# Patient Record
Sex: Female | Born: 1945 | Race: Black or African American | Hispanic: No | Smoking: Former smoker
Health system: Southern US, Community
[De-identification: ages and names within clinical notes are randomized; demographics above are authoritative.]

## PROBLEM LIST (undated history)

## (undated) DIAGNOSIS — I493 Ventricular premature depolarization: Secondary | ICD-10-CM

## (undated) DIAGNOSIS — F419 Anxiety disorder, unspecified: Secondary | ICD-10-CM

## (undated) DIAGNOSIS — J849 Interstitial pulmonary disease, unspecified: Secondary | ICD-10-CM

## (undated) DIAGNOSIS — I10 Essential (primary) hypertension: Secondary | ICD-10-CM

## (undated) DIAGNOSIS — E041 Nontoxic single thyroid nodule: Secondary | ICD-10-CM

## (undated) DIAGNOSIS — E119 Type 2 diabetes mellitus without complications: Secondary | ICD-10-CM

## (undated) DIAGNOSIS — I472 Ventricular tachycardia: Secondary | ICD-10-CM

## (undated) DIAGNOSIS — K649 Unspecified hemorrhoids: Secondary | ICD-10-CM

## (undated) DIAGNOSIS — I272 Pulmonary hypertension, unspecified: Secondary | ICD-10-CM

## (undated) DIAGNOSIS — H269 Unspecified cataract: Secondary | ICD-10-CM

## (undated) DIAGNOSIS — D259 Leiomyoma of uterus, unspecified: Secondary | ICD-10-CM

## (undated) DIAGNOSIS — L509 Urticaria, unspecified: Secondary | ICD-10-CM

## (undated) DIAGNOSIS — T7840XA Allergy, unspecified, initial encounter: Secondary | ICD-10-CM

## (undated) DIAGNOSIS — M1990 Unspecified osteoarthritis, unspecified site: Secondary | ICD-10-CM

## (undated) DIAGNOSIS — K219 Gastro-esophageal reflux disease without esophagitis: Secondary | ICD-10-CM

## (undated) DIAGNOSIS — E785 Hyperlipidemia, unspecified: Secondary | ICD-10-CM

## (undated) DIAGNOSIS — I4729 Other ventricular tachycardia: Secondary | ICD-10-CM

## (undated) HISTORY — DX: Gastro-esophageal reflux disease without esophagitis: K21.9

## (undated) HISTORY — DX: Pulmonary hypertension, unspecified: I27.20

## (undated) HISTORY — DX: Ventricular premature depolarization: I49.3

## (undated) HISTORY — DX: Leiomyoma of uterus, unspecified: D25.9

## (undated) HISTORY — DX: Hyperlipidemia, unspecified: E78.5

## (undated) HISTORY — DX: Unspecified cataract: H26.9

## (undated) HISTORY — DX: Other ventricular tachycardia: I47.29

## (undated) HISTORY — DX: Interstitial pulmonary disease, unspecified: J84.9

## (undated) HISTORY — DX: Other disorders of phosphorus metabolism: E83.39

## (undated) HISTORY — DX: Essential (primary) hypertension: I10

## (undated) HISTORY — PX: TOENAIL EXCISION: SUR558

## (undated) HISTORY — DX: Unspecified osteoarthritis, unspecified site: M19.90

## (undated) HISTORY — DX: Type 2 diabetes mellitus without complications: E11.9

## (undated) HISTORY — DX: Urticaria, unspecified: L50.9

## (undated) HISTORY — DX: Anxiety disorder, unspecified: F41.9

## (undated) HISTORY — DX: Allergy, unspecified, initial encounter: T78.40XA

## (undated) HISTORY — DX: Ventricular tachycardia: I47.2

## (undated) HISTORY — DX: Nontoxic single thyroid nodule: E04.1

## (undated) HISTORY — PX: POLYPECTOMY: SHX149

## (undated) HISTORY — PX: TYMPANOSTOMY TUBE PLACEMENT: SHX32

## (undated) HISTORY — DX: Unspecified hemorrhoids: K64.9

## (undated) HISTORY — PX: TUBAL LIGATION: SHX77

---

## 1999-04-30 HISTORY — PX: OTHER SURGICAL HISTORY: SHX169

## 1999-11-28 ENCOUNTER — Ambulatory Visit (HOSPITAL_COMMUNITY): Admission: RE | Admit: 1999-11-28 | Discharge: 1999-11-28 | Payer: Self-pay | Admitting: Endocrinology

## 1999-11-28 ENCOUNTER — Encounter: Payer: Self-pay | Admitting: Endocrinology

## 2001-05-21 ENCOUNTER — Emergency Department (HOSPITAL_COMMUNITY): Admission: EM | Admit: 2001-05-21 | Discharge: 2001-05-22 | Payer: Self-pay | Admitting: Emergency Medicine

## 2001-05-21 ENCOUNTER — Encounter: Payer: Self-pay | Admitting: Emergency Medicine

## 2002-01-04 ENCOUNTER — Encounter: Admission: RE | Admit: 2002-01-04 | Discharge: 2002-01-04 | Payer: Self-pay | Admitting: Endocrinology

## 2002-01-04 ENCOUNTER — Encounter: Payer: Self-pay | Admitting: Endocrinology

## 2002-01-16 HISTORY — PX: OTHER SURGICAL HISTORY: SHX169

## 2002-01-19 ENCOUNTER — Other Ambulatory Visit: Admission: RE | Admit: 2002-01-19 | Discharge: 2002-01-19 | Payer: Self-pay | Admitting: Endocrinology

## 2003-01-22 ENCOUNTER — Other Ambulatory Visit: Admission: RE | Admit: 2003-01-22 | Discharge: 2003-01-22 | Payer: Self-pay | Admitting: Endocrinology

## 2004-01-03 ENCOUNTER — Emergency Department (HOSPITAL_COMMUNITY): Admission: EM | Admit: 2004-01-03 | Discharge: 2004-01-03 | Payer: Self-pay | Admitting: Family Medicine

## 2004-01-23 ENCOUNTER — Other Ambulatory Visit: Admission: RE | Admit: 2004-01-23 | Discharge: 2004-01-23 | Payer: Self-pay | Admitting: Endocrinology

## 2004-05-27 ENCOUNTER — Ambulatory Visit: Payer: Self-pay | Admitting: Endocrinology

## 2004-10-30 ENCOUNTER — Ambulatory Visit: Payer: Self-pay | Admitting: Endocrinology

## 2004-12-02 ENCOUNTER — Ambulatory Visit: Payer: Self-pay | Admitting: Gastroenterology

## 2005-01-21 ENCOUNTER — Ambulatory Visit: Payer: Self-pay | Admitting: Endocrinology

## 2005-01-27 ENCOUNTER — Ambulatory Visit: Payer: Self-pay | Admitting: Endocrinology

## 2005-06-25 ENCOUNTER — Ambulatory Visit: Payer: Self-pay | Admitting: Internal Medicine

## 2005-06-29 HISTORY — PX: COLONOSCOPY: SHX174

## 2005-07-01 ENCOUNTER — Ambulatory Visit: Payer: Self-pay | Admitting: Gastroenterology

## 2005-07-16 ENCOUNTER — Ambulatory Visit: Payer: Self-pay | Admitting: Gastroenterology

## 2005-08-18 ENCOUNTER — Ambulatory Visit: Payer: Self-pay | Admitting: Internal Medicine

## 2005-08-26 ENCOUNTER — Ambulatory Visit (HOSPITAL_COMMUNITY): Admission: RE | Admit: 2005-08-26 | Discharge: 2005-08-26 | Payer: Self-pay | Admitting: Internal Medicine

## 2005-10-15 ENCOUNTER — Ambulatory Visit: Payer: Self-pay | Admitting: Endocrinology

## 2005-10-31 ENCOUNTER — Ambulatory Visit: Payer: Self-pay | Admitting: Family Medicine

## 2006-01-22 ENCOUNTER — Ambulatory Visit: Payer: Self-pay | Admitting: Endocrinology

## 2006-02-02 ENCOUNTER — Ambulatory Visit: Payer: Self-pay | Admitting: Endocrinology

## 2006-02-12 ENCOUNTER — Ambulatory Visit: Payer: Self-pay

## 2006-02-12 ENCOUNTER — Encounter: Payer: Self-pay | Admitting: Endocrinology

## 2006-02-12 HISTORY — PX: OTHER SURGICAL HISTORY: SHX169

## 2006-03-24 ENCOUNTER — Ambulatory Visit: Payer: Self-pay | Admitting: Endocrinology

## 2006-05-03 ENCOUNTER — Ambulatory Visit: Payer: Self-pay | Admitting: Endocrinology

## 2006-05-03 LAB — CONVERTED CEMR LAB
Chol/HDL Ratio, serum: 3.3
Cholesterol: 114 mg/dL (ref 0–200)
HDL: 34.2 mg/dL — ABNORMAL LOW (ref >39.0)
Hgb A1c MFr Bld: 5.4 % (ref 4.6–6.0)
LDL Cholesterol: 70 mg/dL (ref 0–99)
Triglyceride fasting, serum: 48 mg/dL (ref 0–149)
VLDL: 10 mg/dL (ref 0–40)

## 2006-05-05 ENCOUNTER — Ambulatory Visit: Payer: Self-pay | Admitting: Endocrinology

## 2006-05-26 ENCOUNTER — Ambulatory Visit: Payer: Self-pay | Admitting: Endocrinology

## 2006-07-10 ENCOUNTER — Ambulatory Visit: Payer: Self-pay | Admitting: Family Medicine

## 2006-07-30 ENCOUNTER — Ambulatory Visit: Payer: Self-pay | Admitting: Internal Medicine

## 2006-09-25 ENCOUNTER — Ambulatory Visit: Payer: Self-pay | Admitting: Family Medicine

## 2006-11-27 ENCOUNTER — Ambulatory Visit: Payer: Self-pay | Admitting: Family Medicine

## 2006-12-10 ENCOUNTER — Ambulatory Visit: Payer: Self-pay | Admitting: Endocrinology

## 2006-12-16 ENCOUNTER — Ambulatory Visit: Payer: Self-pay | Admitting: Endocrinology

## 2007-01-14 ENCOUNTER — Encounter: Payer: Self-pay | Admitting: Endocrinology

## 2007-01-14 DIAGNOSIS — K219 Gastro-esophageal reflux disease without esophagitis: Secondary | ICD-10-CM

## 2007-01-14 DIAGNOSIS — J302 Other seasonal allergic rhinitis: Secondary | ICD-10-CM | POA: Insufficient documentation

## 2007-01-14 DIAGNOSIS — J309 Allergic rhinitis, unspecified: Secondary | ICD-10-CM

## 2007-01-14 DIAGNOSIS — M810 Age-related osteoporosis without current pathological fracture: Secondary | ICD-10-CM

## 2007-01-25 ENCOUNTER — Ambulatory Visit: Payer: Self-pay | Admitting: Internal Medicine

## 2007-01-28 ENCOUNTER — Ambulatory Visit: Payer: Self-pay | Admitting: Endocrinology

## 2007-01-28 LAB — CONVERTED CEMR LAB
Albumin: 3.8 g/dL (ref 3.5–5.2)
Alkaline Phosphatase: 104 units/L (ref 39–117)
BUN: 11 mg/dL (ref 6–23)
Basophils Absolute: 0 10*3/uL (ref 0.0–0.1)
Basophils Relative: 0.6 % (ref 0.0–1.0)
Bilirubin Urine: NEGATIVE
Cholesterol: 161 mg/dL (ref 0–200)
Creatinine, Ser: 0.6 mg/dL (ref 0.4–1.2)
Creatinine,U: 93.2 mg/dL
Crystals: NEGATIVE
GFR calc Af Amer: 131 mL/min
GFR calc non Af Amer: 108 mL/min
HDL: 36.7 mg/dL — ABNORMAL LOW (ref >39.0)
Ketones, ur: NEGATIVE mg/dL
LDL Cholesterol: 105 mg/dL — ABNORMAL HIGH (ref 0–99)
Lymphocytes Relative: 41.1 % (ref 12.0–46.0)
MCHC: 33.2 g/dL (ref 30.0–36.0)
Microalb Creat Ratio: 6.4 mg/g (ref 0.0–30.0)
Microalb, Ur: 0.6 mg/dL (ref 0.0–1.9)
Monocytes Absolute: 0.5 10*3/uL (ref 0.2–0.7)
Monocytes Relative: 9.5 % (ref 3.0–11.0)
Neutro Abs: 2 10*3/uL (ref 1.4–7.7)
Nitrite: NEGATIVE
Platelets: 218 10*3/uL (ref 150–400)
Potassium: 4.3 meq/L (ref 3.5–5.1)
Sodium: 144 meq/L (ref 135–145)
Specific Gravity, Urine: 1.015 (ref 1.000–1.03)
TSH: 1.33 microintl units/mL (ref 0.35–5.50)
Total Bilirubin: 0.8 mg/dL (ref 0.3–1.2)
Triglycerides: 97 mg/dL (ref 0–149)
Urine Glucose: NEGATIVE mg/dL
Urobilinogen, UA: 0.2 (ref 0.0–1.0)
VLDL: 19 mg/dL (ref 0–40)
pH: 6 (ref 5.0–8.0)

## 2007-04-11 ENCOUNTER — Ambulatory Visit: Payer: Self-pay | Admitting: Endocrinology

## 2007-04-18 ENCOUNTER — Encounter: Payer: Self-pay | Admitting: Endocrinology

## 2007-08-01 ENCOUNTER — Encounter: Payer: Self-pay | Admitting: Endocrinology

## 2007-08-01 ENCOUNTER — Ambulatory Visit: Payer: Self-pay | Admitting: Endocrinology

## 2007-08-01 DIAGNOSIS — R05 Cough: Secondary | ICD-10-CM

## 2007-08-02 ENCOUNTER — Telehealth: Payer: Self-pay | Admitting: Endocrinology

## 2007-08-09 ENCOUNTER — Telehealth (INDEPENDENT_AMBULATORY_CARE_PROVIDER_SITE_OTHER): Payer: Self-pay | Admitting: *Deleted

## 2007-08-10 ENCOUNTER — Ambulatory Visit: Payer: Self-pay | Admitting: Internal Medicine

## 2007-08-10 DIAGNOSIS — I10 Essential (primary) hypertension: Secondary | ICD-10-CM | POA: Insufficient documentation

## 2007-08-10 DIAGNOSIS — M109 Gout, unspecified: Secondary | ICD-10-CM

## 2007-08-10 DIAGNOSIS — F411 Generalized anxiety disorder: Secondary | ICD-10-CM | POA: Insufficient documentation

## 2007-08-25 ENCOUNTER — Encounter: Payer: Self-pay | Admitting: Internal Medicine

## 2007-08-25 ENCOUNTER — Ambulatory Visit: Payer: Self-pay | Admitting: Endocrinology

## 2007-08-25 DIAGNOSIS — J209 Acute bronchitis, unspecified: Secondary | ICD-10-CM

## 2007-09-01 ENCOUNTER — Telehealth: Payer: Self-pay | Admitting: Internal Medicine

## 2007-09-01 ENCOUNTER — Encounter: Payer: Self-pay | Admitting: Internal Medicine

## 2007-10-12 ENCOUNTER — Telehealth: Payer: Self-pay | Admitting: Endocrinology

## 2007-11-17 ENCOUNTER — Ambulatory Visit: Payer: Self-pay | Admitting: Endocrinology

## 2007-11-17 LAB — CONVERTED CEMR LAB
ALT: 29 units/L (ref 0–35)
AST: 22 units/L (ref 0–37)
Alkaline Phosphatase: 89 units/L (ref 39–117)
BUN: 10 mg/dL (ref 6–23)
CO2: 28 meq/L (ref 19–32)
Chloride: 105 meq/L (ref 96–112)
Cholesterol: 203 mg/dL (ref 0–200)
GFR calc non Af Amer: 77 mL/min
Potassium: 4 meq/L (ref 3.5–5.1)
Total Bilirubin: 0.7 mg/dL (ref 0.3–1.2)
VLDL: 24 mg/dL (ref 0–40)

## 2007-11-24 ENCOUNTER — Ambulatory Visit: Payer: Self-pay | Admitting: Endocrinology

## 2007-11-24 DIAGNOSIS — R748 Abnormal levels of other serum enzymes: Secondary | ICD-10-CM | POA: Insufficient documentation

## 2007-11-24 DIAGNOSIS — E041 Nontoxic single thyroid nodule: Secondary | ICD-10-CM

## 2007-11-24 DIAGNOSIS — G5600 Carpal tunnel syndrome, unspecified upper limb: Secondary | ICD-10-CM

## 2007-11-24 DIAGNOSIS — D259 Leiomyoma of uterus, unspecified: Secondary | ICD-10-CM | POA: Insufficient documentation

## 2007-11-24 DIAGNOSIS — E7800 Pure hypercholesterolemia, unspecified: Secondary | ICD-10-CM

## 2007-12-19 ENCOUNTER — Ambulatory Visit: Payer: Self-pay | Admitting: Endocrinology

## 2008-03-03 ENCOUNTER — Telehealth: Payer: Self-pay | Admitting: Endocrinology

## 2008-03-06 ENCOUNTER — Telehealth: Payer: Self-pay | Admitting: Endocrinology

## 2008-03-13 ENCOUNTER — Encounter (INDEPENDENT_AMBULATORY_CARE_PROVIDER_SITE_OTHER): Payer: Self-pay | Admitting: *Deleted

## 2008-03-13 ENCOUNTER — Telehealth: Payer: Self-pay | Admitting: Endocrinology

## 2008-03-26 ENCOUNTER — Ambulatory Visit: Payer: Self-pay | Admitting: Endocrinology

## 2008-03-26 ENCOUNTER — Encounter: Payer: Self-pay | Admitting: Endocrinology

## 2008-03-28 LAB — CONVERTED CEMR LAB
AST: 38 units/L — ABNORMAL HIGH (ref 0–37)
Albumin: 3.9 g/dL (ref 3.5–5.2)
Alkaline Phosphatase: 110 units/L (ref 39–117)
BUN: 8 mg/dL (ref 6–23)
Basophils Absolute: 0 10*3/uL (ref 0.0–0.1)
Bilirubin Urine: NEGATIVE
Chloride: 109 meq/L (ref 96–112)
Creatinine, Ser: 0.7 mg/dL (ref 0.4–1.2)
Creatinine,U: 64.4 mg/dL
GFR calc Af Amer: 109 mL/min
GFR calc non Af Amer: 90 mL/min
Glucose, Bld: 111 mg/dL — ABNORMAL HIGH (ref 70–99)
HDL: 37 mg/dL — ABNORMAL LOW (ref >39.0)
Hemoglobin, Urine: NEGATIVE
Hgb A1c MFr Bld: 5.8 % (ref 4.6–6.0)
Ketones, ur: NEGATIVE mg/dL
LDL Cholesterol: 132 mg/dL — ABNORMAL HIGH (ref 0–99)
Leukocytes, UA: NEGATIVE
Lymphocytes Relative: 25.3 % (ref 12.0–46.0)
MCHC: 33.6 g/dL (ref 30.0–36.0)
Microalb Creat Ratio: 4.7 mg/g (ref 0.0–30.0)
Neutro Abs: 3.8 10*3/uL (ref 1.4–7.7)
Neutrophils Relative %: 63.3 % (ref 43.0–77.0)
Platelets: 247 10*3/uL (ref 150–400)
Potassium: 4 meq/L (ref 3.5–5.1)
RDW: 12.6 % (ref 11.5–14.6)
Sodium: 144 meq/L (ref 135–145)
Total CHOL/HDL Ratio: 5.4
Total Protein: 7.8 g/dL (ref 6.0–8.3)
Triglycerides: 145 mg/dL (ref 0–149)
Urobilinogen, UA: 0.2 (ref 0.0–1.0)

## 2008-04-13 ENCOUNTER — Ambulatory Visit: Payer: Self-pay | Admitting: Endocrinology

## 2008-04-13 DIAGNOSIS — M79609 Pain in unspecified limb: Secondary | ICD-10-CM

## 2008-04-13 DIAGNOSIS — K769 Liver disease, unspecified: Secondary | ICD-10-CM | POA: Insufficient documentation

## 2008-04-25 ENCOUNTER — Ambulatory Visit: Payer: Self-pay

## 2008-04-25 ENCOUNTER — Encounter: Payer: Self-pay | Admitting: Endocrinology

## 2008-05-31 ENCOUNTER — Telehealth (INDEPENDENT_AMBULATORY_CARE_PROVIDER_SITE_OTHER): Payer: Self-pay | Admitting: *Deleted

## 2008-07-19 ENCOUNTER — Ambulatory Visit: Payer: Self-pay | Admitting: Endocrinology

## 2008-07-19 LAB — CONVERTED CEMR LAB
HDL: 38.1 mg/dL — ABNORMAL LOW (ref >39.0)
Total CHOL/HDL Ratio: 4.5

## 2008-08-04 ENCOUNTER — Telehealth: Payer: Self-pay | Admitting: Endocrinology

## 2008-08-13 ENCOUNTER — Telehealth (INDEPENDENT_AMBULATORY_CARE_PROVIDER_SITE_OTHER): Payer: Self-pay | Admitting: *Deleted

## 2008-12-26 ENCOUNTER — Ambulatory Visit: Payer: Self-pay | Admitting: Internal Medicine

## 2008-12-26 DIAGNOSIS — N390 Urinary tract infection, site not specified: Secondary | ICD-10-CM | POA: Insufficient documentation

## 2008-12-26 DIAGNOSIS — M545 Low back pain: Secondary | ICD-10-CM

## 2008-12-26 LAB — CONVERTED CEMR LAB
Nitrite: NEGATIVE
Specific Gravity, Urine: 1.01 (ref 1.000–1.030)
Total Protein, Urine: NEGATIVE mg/dL
pH: 7 (ref 5.0–8.0)

## 2008-12-28 ENCOUNTER — Telehealth: Payer: Self-pay | Admitting: Family Medicine

## 2009-01-01 ENCOUNTER — Encounter: Payer: Self-pay | Admitting: Internal Medicine

## 2009-02-12 ENCOUNTER — Ambulatory Visit: Payer: Self-pay | Admitting: Internal Medicine

## 2009-02-12 ENCOUNTER — Encounter: Payer: Self-pay | Admitting: Endocrinology

## 2009-04-29 ENCOUNTER — Telehealth: Payer: Self-pay | Admitting: Endocrinology

## 2009-06-18 ENCOUNTER — Ambulatory Visit: Payer: Self-pay | Admitting: Endocrinology

## 2009-06-19 LAB — CONVERTED CEMR LAB
ALT: 24 units/L (ref 0–35)
AST: 25 units/L (ref 0–37)
Albumin: 3.9 g/dL (ref 3.5–5.2)
Alkaline Phosphatase: 99 units/L (ref 39–117)
Basophils Absolute: 0 10*3/uL (ref 0.0–0.1)
Calcium: 9.4 mg/dL (ref 8.4–10.5)
Cholesterol: 222 mg/dL — ABNORMAL HIGH (ref 0–200)
Creatinine,U: 54.6 mg/dL
Direct LDL: 165.6 mg/dL
Eosinophils Relative: 6 % — ABNORMAL HIGH (ref 0.0–5.0)
GFR calc non Af Amer: 92.9 mL/min (ref >60)
HCT: 40.1 % (ref 36.0–46.0)
Hemoglobin: 13.2 g/dL (ref 12.0–15.0)
Lymphocytes Relative: 43.9 % (ref 12.0–46.0)
Lymphs Abs: 2.2 10*3/uL (ref 0.7–4.0)
Microalb Creat Ratio: 5.5 mg/g (ref 0.0–30.0)
Monocytes Relative: 13.2 % — ABNORMAL HIGH (ref 3.0–12.0)
Neutro Abs: 1.8 10*3/uL (ref 1.4–7.7)
Potassium: 3.9 meq/L (ref 3.5–5.1)
RBC: 4.56 M/uL (ref 3.87–5.11)
RDW: 13.2 % (ref 11.5–14.6)
Sodium: 142 meq/L (ref 135–145)
Specific Gravity, Urine: 1.01 (ref 1.000–1.030)
Total CHOL/HDL Ratio: 5
Urine Glucose: NEGATIVE mg/dL
Urobilinogen, UA: 0.2 (ref 0.0–1.0)
VLDL: 15.6 mg/dL (ref 0.0–40.0)
WBC: 4.9 10*3/uL (ref 4.5–10.5)
pH: 6 (ref 5.0–8.0)

## 2009-06-25 ENCOUNTER — Telehealth: Payer: Self-pay | Admitting: Endocrinology

## 2009-07-02 ENCOUNTER — Telehealth (INDEPENDENT_AMBULATORY_CARE_PROVIDER_SITE_OTHER): Payer: Self-pay | Admitting: *Deleted

## 2009-07-25 ENCOUNTER — Ambulatory Visit: Payer: Self-pay | Admitting: Endocrinology

## 2009-08-15 ENCOUNTER — Telehealth (INDEPENDENT_AMBULATORY_CARE_PROVIDER_SITE_OTHER): Payer: Self-pay | Admitting: *Deleted

## 2009-08-27 ENCOUNTER — Ambulatory Visit: Payer: Self-pay | Admitting: Endocrinology

## 2009-09-10 ENCOUNTER — Encounter: Payer: Self-pay | Admitting: Endocrinology

## 2009-09-10 LAB — CONVERTED CEMR LAB
Basophils Relative: 0 %
Creatinine, Ser: 0.66 mg/dL
Glucose, Bld: 102 mg/dL
MCV: 86.7 fL
Neutrophils Relative %: 44.7 %
Platelets: 202 10*3/uL
Potassium: 3.3 meq/L
RBC: 4.42 M/uL
Sodium: 137 meq/L
WBC: 5.1 10*3/uL

## 2009-10-21 ENCOUNTER — Ambulatory Visit: Payer: Self-pay | Admitting: Endocrinology

## 2009-12-20 ENCOUNTER — Ambulatory Visit: Payer: Self-pay | Admitting: Endocrinology

## 2009-12-20 DIAGNOSIS — R519 Headache, unspecified: Secondary | ICD-10-CM | POA: Insufficient documentation

## 2009-12-20 DIAGNOSIS — R5383 Other fatigue: Secondary | ICD-10-CM

## 2009-12-20 DIAGNOSIS — R5381 Other malaise: Secondary | ICD-10-CM

## 2009-12-20 DIAGNOSIS — R51 Headache: Secondary | ICD-10-CM

## 2009-12-20 LAB — CONVERTED CEMR LAB
Albumin: 4 g/dL (ref 3.5–5.2)
Basophils Absolute: 0 10*3/uL (ref 0.0–0.1)
Basophils Relative: 0.7 % (ref 0.0–3.0)
Calcium: 9.3 mg/dL (ref 8.4–10.5)
Cholesterol: 135 mg/dL (ref 0–200)
Eosinophils Relative: 4.9 % (ref 0.0–5.0)
GFR calc non Af Amer: 106.45 mL/min (ref >60)
Hemoglobin: 12.5 g/dL (ref 12.0–15.0)
LDL Cholesterol: 77 mg/dL (ref 0–99)
Lymphocytes Relative: 38.5 % (ref 12.0–46.0)
Monocytes Relative: 12.2 % — ABNORMAL HIGH (ref 3.0–12.0)
Neutro Abs: 2.1 10*3/uL (ref 1.4–7.7)
RBC: 4.36 M/uL (ref 3.87–5.11)
RDW: 13.7 % (ref 11.5–14.6)
Sed Rate: 17 mm/hr (ref 0–22)
Sodium: 143 meq/L (ref 135–145)
TSH: 0.85 microintl units/mL (ref 0.35–5.50)
Total CHOL/HDL Ratio: 3
Total Protein: 7.3 g/dL (ref 6.0–8.3)
Triglycerides: 89 mg/dL (ref 0.0–149.0)
VLDL: 17.8 mg/dL (ref 0.0–40.0)
WBC: 4.8 10*3/uL (ref 4.5–10.5)

## 2010-03-09 ENCOUNTER — Emergency Department (HOSPITAL_COMMUNITY): Admission: EM | Admit: 2010-03-09 | Discharge: 2010-03-09 | Payer: Self-pay | Admitting: Emergency Medicine

## 2010-03-10 ENCOUNTER — Ambulatory Visit: Payer: Self-pay | Admitting: Endocrinology

## 2010-03-10 DIAGNOSIS — R079 Chest pain, unspecified: Secondary | ICD-10-CM

## 2010-03-17 ENCOUNTER — Ambulatory Visit: Payer: Self-pay | Admitting: Internal Medicine

## 2010-03-18 ENCOUNTER — Telehealth: Payer: Self-pay | Admitting: Endocrinology

## 2010-03-21 ENCOUNTER — Telehealth: Payer: Self-pay | Admitting: Endocrinology

## 2010-04-09 ENCOUNTER — Telehealth: Payer: Self-pay | Admitting: Endocrinology

## 2010-05-28 ENCOUNTER — Telehealth: Payer: Self-pay | Admitting: Endocrinology

## 2010-06-13 ENCOUNTER — Ambulatory Visit: Payer: Self-pay | Admitting: Endocrinology

## 2010-06-24 ENCOUNTER — Emergency Department (HOSPITAL_COMMUNITY)
Admission: EM | Admit: 2010-06-24 | Discharge: 2010-06-24 | Payer: Self-pay | Source: Home / Self Care | Admitting: Emergency Medicine

## 2010-07-02 ENCOUNTER — Encounter: Payer: Self-pay | Admitting: Endocrinology

## 2010-07-07 ENCOUNTER — Other Ambulatory Visit: Payer: Self-pay | Admitting: Endocrinology

## 2010-07-07 DIAGNOSIS — Z87891 Personal history of nicotine dependence: Secondary | ICD-10-CM | POA: Insufficient documentation

## 2010-07-07 LAB — URINALYSIS, ROUTINE W REFLEX MICROSCOPIC
Bilirubin Urine: NEGATIVE
Hemoglobin, Urine: NEGATIVE
Ketones, ur: NEGATIVE
Nitrite: NEGATIVE
Specific Gravity, Urine: 1.015 (ref 1.000–1.030)
Total Protein, Urine: NEGATIVE
Urine Glucose: NEGATIVE
Urobilinogen, UA: 0.2 (ref 0.0–1.0)
pH: 5.5 (ref 5.0–8.0)

## 2010-07-07 LAB — LIPID PANEL
Cholesterol: 132 mg/dL (ref 0–200)
HDL: 43 mg/dL (ref >39.00)
LDL Cholesterol: 75 mg/dL (ref 0–99)
Total CHOL/HDL Ratio: 3
Triglycerides: 68 mg/dL (ref 0.0–149.0)
VLDL: 13.6 mg/dL (ref 0.0–40.0)

## 2010-07-07 LAB — HEPATIC FUNCTION PANEL
ALT: 32 U/L (ref 0–35)
AST: 26 U/L (ref 0–37)
Albumin: 3.9 g/dL (ref 3.5–5.2)
Alkaline Phosphatase: 102 U/L (ref 39–117)
Bilirubin, Direct: 0.1 mg/dL (ref 0.0–0.3)
Total Bilirubin: 0.9 mg/dL (ref 0.3–1.2)
Total Protein: 7.3 g/dL (ref 6.0–8.3)

## 2010-07-07 LAB — BASIC METABOLIC PANEL
BUN: 11 mg/dL (ref 6–23)
CO2: 28 mEq/L (ref 19–32)
Calcium: 9.5 mg/dL (ref 8.4–10.5)
Chloride: 105 mEq/L (ref 96–112)
Creatinine, Ser: 0.7 mg/dL (ref 0.4–1.2)
GFR: 117.66 mL/min (ref >60.00)
Glucose, Bld: 94 mg/dL (ref 70–99)
Potassium: 3.9 mEq/L (ref 3.5–5.1)
Sodium: 142 mEq/L (ref 135–145)

## 2010-07-07 LAB — CBC WITH DIFFERENTIAL/PLATELET
Basophils Absolute: 0 10*3/uL (ref 0.0–0.1)
Basophils Relative: 0.6 % (ref 0.0–3.0)
Eosinophils Absolute: 0.2 10*3/uL (ref 0.0–0.7)
Eosinophils Relative: 4.3 % (ref 0.0–5.0)
HCT: 40.3 % (ref 36.0–46.0)
Hemoglobin: 13.6 g/dL (ref 12.0–15.0)
Lymphocytes Relative: 37.1 % (ref 12.0–46.0)
Lymphs Abs: 2.1 10*3/uL (ref 0.7–4.0)
MCHC: 33.6 g/dL (ref 30.0–36.0)
MCV: 87.6 fl (ref 78.0–100.0)
Monocytes Absolute: 0.6 10*3/uL (ref 0.1–1.0)
Monocytes Relative: 10.8 % (ref 3.0–12.0)
Neutro Abs: 2.7 10*3/uL (ref 1.4–7.7)
Neutrophils Relative %: 47.2 % (ref 43.0–77.0)
Platelets: 214 10*3/uL (ref 150.0–400.0)
RBC: 4.6 Mil/uL (ref 3.87–5.11)
RDW: 14.4 % (ref 11.5–14.6)
WBC: 5.7 10*3/uL (ref 4.5–10.5)

## 2010-07-07 LAB — MICROALBUMIN / CREATININE URINE RATIO
Creatinine,U: 105.7 mg/dL
Microalb Creat Ratio: 0.6 mg/g (ref 0.0–30.0)
Microalb, Ur: 0.6 mg/dL (ref 0.0–1.9)

## 2010-07-07 LAB — HEMOGLOBIN A1C: Hgb A1c MFr Bld: 6 % (ref 4.6–6.5)

## 2010-07-07 LAB — URIC ACID: Uric Acid, Serum: 5.2 mg/dL (ref 2.4–7.0)

## 2010-07-07 LAB — TSH: TSH: 0.9 u[IU]/mL (ref 0.35–5.50)

## 2010-07-09 ENCOUNTER — Telehealth: Payer: Self-pay | Admitting: Endocrinology

## 2010-07-14 ENCOUNTER — Encounter
Admission: RE | Admit: 2010-07-14 | Discharge: 2010-07-14 | Payer: Self-pay | Source: Home / Self Care | Attending: Endocrinology | Admitting: Endocrinology

## 2010-07-14 ENCOUNTER — Encounter: Admission: RE | Admit: 2010-07-14 | Payer: Self-pay | Source: Home / Self Care | Admitting: Endocrinology

## 2010-07-27 LAB — CONVERTED CEMR LAB
ALT: 24 units/L (ref 0–35)
ALT: 25 units/L (ref 0–35)
AST: 23 units/L (ref 0–37)
Albumin: 3.9 g/dL (ref 3.5–5.2)
Alkaline Phosphatase: 105 units/L (ref 39–117)
Alkaline Phosphatase: 95 units/L (ref 39–117)
Bilirubin, Direct: 0.1 mg/dL (ref 0.0–0.3)
Bilirubin, Direct: 0.1 mg/dL (ref 0.0–0.3)
Cholesterol: 99 mg/dL (ref 0–200)
Total Protein: 7.5 g/dL (ref 6.0–8.3)
Total Protein: 7.8 g/dL (ref 6.0–8.3)

## 2010-07-29 ENCOUNTER — Telehealth: Payer: Self-pay | Admitting: Endocrinology

## 2010-07-29 NOTE — Assessment & Plan Note (Signed)
Summary: cough,cold.cd   Vital Signs:  Patient profile:   65 year old female Height:      64 inches (162.56 cm) Weight:      165.50 pounds (75.23 kg) O2 Sat:      95 % on Room air Temp:     97.8 degrees F (36.56 degrees C) oral Pulse rate:   89 / minute BP sitting:   118 / 68  (left arm) Cuff size:   regular  Vitals Entered By: Josph Macho RMA (August 27, 2009 1:37 PM)  O2 Flow:  Room air CC: sore throat, cough, and cold X2weeks/ CF Is Patient Diabetic? Yes   CC:  sore throat, cough, and and cold X2weeks/ CF.  History of Present Illness: pt states 1 week of nasal congestion, slightly prod cough, sore throat.  she now has fever x 2 days, and bilat earache.  Current Medications (verified): 1)  Glucophage Xr 500 Mg  Tb24 (Metformin Hcl) .... Take 1 By Mouth Qd 2)  Accu-Chek Aviva   Strp (Glucose Blood) .... Any Brand, With Strips Qd 3)  Allegra-D 12 Hour 60-120 Mg Tb12 (Fexofenadine-Pseudoephedrine) .... Take 1 Two Times A Day Prn 4)  Prilosec Otc 20 Mg Tbec (Omeprazole Magnesium) .Marland Kitchen.. 1 By Mouth Once Daily 5)  Aleve 220 Mg Caps (Naproxen Sodium) .Marland Kitchen.. 1 By Mouth Once Daily As Needed 6)  Lisinopril-Hydrochlorothiazide 10-12.5 Mg Tabs (Lisinopril-Hydrochlorothiazide) .Marland Kitchen.. 1 Qd 7)  Zetia 10 Mg Tabs (Ezetimibe) .Marland Kitchen.. 1 Qd 8)  Simvastatin 80 Mg Tabs (Simvastatin) .Marland Kitchen.. 1 Qhs  Allergies (verified): No Known Drug Allergies  Past History:  Past Medical History: Last updated: 08/10/2007 Allergic rhinitis GERD Osteoporosis Smoker (Quit 1960) Uterine Fibroids Increase ALK PHOS, w/u Neg Dyslipidemia Small Thyroid Nodule Diabetes mellitus, type II thyroid nodule Hypertension Anxiety Gout  Review of Systems  The patient denies syncope and dyspnea on exertion.         denies n/v  Physical Exam  General:  normal appearance.   Head:  head: no deformity eyes: no periorbital swelling, no proptosis external nose and ears are normal mouth: no lesion seen Ears:  left tm  is very red.  right is slightly red.   Mouth:  uvula is slightly red and swollen Lungs:  Clear to auscultation bilaterally. Normal respiratory effort.    Impression & Recommendations:  Problem # 1:  URI (ICD-465.9)  Medications Added to Medication List This Visit: 1)  Cefuroxime Axetil 250 Mg Tabs (Cefuroxime axetil) .Marland Kitchen.. 1 two times a day  Other Orders: Est. Patient Level III (04540)  Patient Instructions: 1)  cefuroxime 250 mg two times a day 2)  continue allegra-d as needed for congestion. 3)  delsym as needed for cough Prescriptions: SIMVASTATIN 80 MG TABS (SIMVASTATIN) 1 qhs  #90 x 3   Entered and Authorized by:   Minus Breeding MD   Signed by:   Minus Breeding MD on 08/27/2009   Method used:   Print then Give to Patient   RxID:   [id number] LISINOPRIL-HYDROCHLOROTHIAZIDE 10-12.5 MG TABS (LISINOPRIL-HYDROCHLOROTHIAZIDE) 1 qd  #90 x 3   Entered and Authorized by:   Minus Breeding MD   Signed by:   Minus Breeding MD on 08/27/2009   Method used:   Print then Give to Patient   RxID:   [id number] ALLEGRA-D 12 HOUR 60-120 MG TB12 (FEXOFENADINE-PSEUDOEPHEDRINE) TAKE 1 two times a day PRN  #60 x 11   Entered and Authorized by:   Minus Breeding  MD   Signed by:   Minus Breeding MD on 08/27/2009   Method used:   Print then Give to Patient   RxID:   [id number] GLUCOPHAGE XR 500 MG  TB24 (METFORMIN HCL) take 1 by mouth qd  #30 Tablet x 11   Entered and Authorized by:   Minus Breeding MD   Signed by:   Minus Breeding MD on 08/27/2009   Method used:   Print then Give to Patient   RxID:   [id number] CEFUROXIME AXETIL 250 MG TABS (CEFUROXIME AXETIL) 1 two times a day  #14 x 0   Entered and Authorized by:   Minus Breeding MD   Signed by:   Minus Breeding MD on 08/27/2009   Method used:   Electronically to        [facility] [phone]* (retail)       [address]       [place], [place]  [address]       Ph: [phone]       Fax: [phone]   RxID:    [id number]

## 2010-07-29 NOTE — Progress Notes (Signed)
Summary: Records request from [facility]  Request for records received from [facility].Request forwarded to [employer]. [name]  August 15, 2009 10:36 AM

## 2010-07-29 NOTE — Progress Notes (Signed)
Summary: Rx refill req  Phone Note Refill Request Message from:  Patient on April 09, 2010 10:05 AM  Refills Requested: Medication #1:  ULTRAM 50 MG TABS 1-2 tablets every 6 hours as needed.   Dosage confirmed as above?Dosage Confirmed   Supply Requested: 6 months  Method Requested: Electronic Initial call taken by: Margaret Pyle, CMA,  April 09, 2010 10:05 AM    Prescriptions: ULTRAM 50 MG TABS (TRAMADOL HCL) 1-2 tablets every 6 hours as needed  #50 x 5   Entered by:   Margaret Pyle, CMA   Authorized by:   Minus Breeding MD   Signed by:   Margaret Pyle, CMA on 04/09/2010   Method used:   Electronically to        [facility] (retail)       [address]       [place], [place]  [address]       Ph: [phone]       Fax: [phone]   RxID:   [id number]

## 2010-07-29 NOTE — Progress Notes (Signed)
Summary: Pt fall/SAE pt  Phone Note Call from Patient Call back at Home Phone [phone]   Caller: Patient Summary of Call: Pt called stating she feel this morning and now has mild bruising with a very small superficial skin tear. Pt is concerned because she is Diabetic  and is requesting advisement from MD. Please advise.  Follow-up for Phone Call        OK for neosporin and gause for now if bleeding stopped (or bandaid if no gauze)  if still bleeding and wont stop -   needs urgent care  if not urgent care, should see Dr Everardo All to address reason for fall if pt not sure Follow-up by: Corwin Levins MD,  May 28, 2010 2:51 PM  Additional Follow-up for Phone Call Additional follow up Details #1::        Pt advised od above and states she is aware of reason for fall (slip on icy patch on upward ramp) Additional Follow-up by: Margaret Pyle, CMA,  May 28, 2010 3:13 PM

## 2010-07-29 NOTE — Assessment & Plan Note (Signed)
Summary: CPX / DID LABS 12-21/NWS   Vital Signs:  Patient profile:   65 year old female Height:      64 inches (162.56 cm) Weight:      163.25 pounds (74.20 kg) O2 Sat:      96 % on Room air Temp:     97.2 degrees F (36.22 degrees C) oral Pulse rate:   79 / minute BP sitting:   118 / 64  (left arm) Cuff size:   regular  Vitals Entered By: Josph Macho CMA (July 25, 2009 3:01 PM)  O2 Flow:  Room air CC: Physical/ CF Is Patient Diabetic? Yes   CC:  Physical/ CF.  History of Present Illness: here for regular wellness examination.  she's feeling pretty well in general, and does not drink or smoke.   Current Medications (verified): 1)  Crestor 40 Mg  Tabs (Rosuvastatin Calcium) .... Take 1 By Mouth Qd 2)  Glucophage Xr 500 Mg  Tb24 (Metformin Hcl) .... Take 1 By Mouth Qd 3)  Accu-Chek Aviva   Strp (Glucose Blood) .... Any Brand, With Strips Qd 4)  Allegra-D 12 Hour 60-120 Mg Tb12 (Fexofenadine-Pseudoephedrine) .... Take 1 Two Times A Day Prn 5)  Prilosec Otc 20 Mg Tbec (Omeprazole Magnesium) .Marland Kitchen.. 1 By Mouth Once Daily 6)  Aleve 220 Mg Caps (Naproxen Sodium) .Marland Kitchen.. 1 By Mouth Once Daily As Needed 7)  Lisinopril-Hydrochlorothiazide 10-12.5 Mg Tabs (Lisinopril-Hydrochlorothiazide) .Marland Kitchen.. 1 Qd 8)  Benzonatate 100 Mg Caps (Benzonatate) .Marland Kitchen.. 1 Tab Three Times A Day As Needed Cough 9)  Zetia 10 Mg Tabs (Ezetimibe) .Marland Kitchen.. 1 Qd  Allergies (verified): No Known Drug Allergies  Past History:  Past Medical History: Last updated: 08/10/2007 Allergic rhinitis GERD Osteoporosis Smoker (Quit 1960) Uterine Fibroids Increase ALK PHOS, w/u Neg Dyslipidemia Small Thyroid Nodule Diabetes mellitus, type II thyroid nodule Hypertension Anxiety Gout  Family History: Reviewed history from 11/24/2007 and no changes required. no cancer  Social History: Reviewed history from 08/10/2007 and no changes required. 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  The patient denies fever, vision loss, decreased hearing, chest pain, syncope, dyspnea on exertion, prolonged cough, headaches, abdominal pain, melena, hematochezia, severe indigestion/heartburn, hematuria, suspicious skin lesions, and depression.    Physical Exam  General:  obese.   Head:  head: no deformity eyes: no periorbital swelling, no proptosis external nose and ears are normal mouth: no lesion seen Neck:  Supple without thyroid enlargement or tenderness. No cervical lymphadenopathy Breasts:  sees gyn  Lungs:  Clear to auscultation bilaterally. Normal respiratory effort.  Heart:  Regular rate and rhythm without murmurs or gallops noted. Normal S1,S2.   Abdomen:  abdomen is soft, nontender.  no hepatosplenomegaly.   not distended.  no hernia  Rectal:  sees gyn  Genitalia:  sees gyn  Msk:  muscle bulk and strength are grossly normal.  no obvious joint swelling.  gait is normal and steady  Pulses:  dorsalis pedis intact bilat.  no carotid bruit  Extremities:  no deformity.  no ulcer on the feet.  feet are of normal color and temp.  no edema  Neurologic:  cn 2-12 grossly intact.   readily moves all 4's.   sensation is intact to touch on the feet  Skin:  normal texture and temp.  no rash.  not diaphoretic  Cervical Nodes:  No significant adenopathy.  Psych:  Alert  and cooperative; normal mood and affect; normal attention span and concentration.     Impression & Recommendations:  Problem # 1:  ROUTINE GENERAL MEDICAL EXAM@HEALTH  CARE FACL (ICD-V70.0)  Medications Added to Medication List This Visit: 1)  Simvastatin 80 Mg Tabs (Simvastatin) .Marland Kitchen.. 1 qhs  Other Orders: EKG w/ Interpretation (93000) TLB-Lipid Panel (80061-LIPID) TLB-Hepatic/Liver Function Pnl (80076-HEPATIC) TLB-Uric Acid, Blood (84550-URIC) Est. Patient 40-64 years ([address]381)  Preventive Care Screening     gyn is dr Algie Coffer, who does  mammography   Patient Instructions: 1)  as you are about to lose your health insurance, let's change crestor to simvastatin 80 mg at bedtime. 2)  we discussed the recommendations of the preventive services task force Prescriptions: SIMVASTATIN 80 MG TABS (SIMVASTATIN) 1 qhs  #90 x 3   Entered and Authorized by:   Minus Breeding MD   Signed by:   Minus Breeding MD on 07/25/2009   Method used:   Print then Give to Patient   RxID:   [id number] SIMVASTATIN 80 MG TABS (SIMVASTATIN) 1 qhs  #90 x 3   Entered and Authorized by:   Minus Breeding MD   Signed by:   Minus Breeding MD on 07/25/2009   Method used:   Electronically to        [facility] [phone]* (retail)       [address]       [place], [place]  [address]       Ph: [phone]       Fax: [phone]   RxID:   [id number]

## 2010-07-29 NOTE — Assessment & Plan Note (Signed)
Summary: ER FU--STC   Vital Signs:  Patient profile:   65 year old female Height:      64 inches (162.56 cm) Weight:      166.50 pounds (75.68 kg) BMI:     28.68 O2 Sat:      98 % on Room air Temp:     97.0 degrees F (36.11 degrees C) oral Pulse rate:   83 / minute BP sitting:   118 / 82  (left arm) Cuff size:   regular  Vitals Entered By: Josph Macho RMA (October 21, 2009 8:16 AM)  O2 Flow:  Room air CC: ER follow up/ CF Is Patient Diabetic? Yes   CC:  ER follow up/ CF.  History of Present Illness: pt was seen in [place] for chest pain.  she feels better.   she has chronic dry cough.  -  Date:  09/10/2009    WBC: 5.1    HGB: 12.3    HCT: 38.3    RBC: 4.42    PLT: 202    MCV: 86.7    RDW: 13.6    Neutrophil: 44.7    Lymphs: 37.5    Monos: 12.7    Eos: 0.2    Basophil: 0.0    BG Random: 102    BUN: 10    Creatinine: 0.66    Sodium: 137    Potassium: 3.3    Chloride: 102    CO2 Total: 32    Calcium: 8.9    GFR(Non African American): >60    GFR(African American): >60  Current Medications (verified): 1)  Glucophage Xr 500 Mg  Tb24 (Metformin Hcl) .... Take 1 By Mouth Qd 2)  Accu-Chek Aviva   Strp (Glucose Blood) .... Any Brand, With Strips Qd 3)  Allegra-D 12 Hour 60-120 Mg Tb12 (Fexofenadine-Pseudoephedrine) .... Take 1 Two Times A Day Prn 4)  Prilosec Otc 20 Mg Tbec (Omeprazole Magnesium) .Marland Kitchen.. 1 By Mouth Once Daily 5)  Aleve 220 Mg Caps (Naproxen Sodium) .Marland Kitchen.. 1 By Mouth Once Daily As Needed 6)  Lisinopril-Hydrochlorothiazide 10-12.5 Mg Tabs (Lisinopril-Hydrochlorothiazide) .Marland Kitchen.. 1 Qd 7)  Zetia 10 Mg Tabs (Ezetimibe) .Marland Kitchen.. 1 Qd 8)  Simvastatin 80 Mg Tabs (Simvastatin) .Marland Kitchen.. 1 Qhs 9)  Cefuroxime Axetil 250 Mg Tabs (Cefuroxime Axetil) .Marland Kitchen.. 1 Two Times A Day  Allergies (verified): 1)  ! Benicar (Olmesartan Medoxomil)  Past History:  Past Medical History: Last updated: 08/10/2007 Allergic rhinitis GERD Osteoporosis Smoker (Quit 1960) Uterine  Fibroids Increase ALK PHOS, w/u Neg Dyslipidemia Small Thyroid Nodule Diabetes mellitus, type II thyroid nodule Hypertension Anxiety Gout  Review of Systems  The patient denies syncope and dyspnea on exertion.    Physical Exam  General:  normal appearance.   Chest Wall:  nontender Lungs:  Clear to auscultation bilaterally. Normal respiratory effort.  Additional Exam:  i reviewed old records:  had neg myoview 2007   Impression & Recommendations:  Problem # 1:  chest pain noncardiogenic.  Problem # 2:  COUGH DUE TO ACE INHIBITORS (ICD-786.2)  Medications Added to Medication List This Visit: 1)  Losartan Potassium-hctz 100-12.5 Mg Tabs (Losartan potassium-hctz) .... 1/2 tab once daily  Other Orders: Est. Patient Level III ([address])  Patient Instructions: 1)  change lisinopril to losartan 20/12.5, 1/2 once daily 2)  return 4 months. Prescriptions: LOSARTAN POTASSIUM-HCTZ 100-12.5 MG TABS (LOSARTAN POTASSIUM-HCTZ) 1/2 tab once daily  #90 x 1   Entered and Authorized by:   Minus Breeding MD   Signed by:  Minus Breeding MD on 10/21/2009   Method used:   Electronically to        [id number]* (retail)       [address] [place], [place]  [address]       Ph: [phone]       Fax: [phone]   RxID:   [id number]

## 2010-07-29 NOTE — Progress Notes (Signed)
Summary: [facility]  Request for records received from [facility]. Request forwarded to [employer]. Dena Chavis  July 02, 2009 3:29 PM

## 2010-07-29 NOTE — Assessment & Plan Note (Signed)
Summary: ER FU/ BP WAS HIGH/ THOUGHT SHE WAS HAVING HEART ATTACK/NWS   Vital Signs:  Patient profile:   65 year old female Height:      64 inches (162.56 cm) Weight:      171.50 pounds (77.95 kg) BMI:     29.54 O2 Sat:      96 % on Room air Temp:     98.7 degrees F (37.06 degrees C) oral Pulse rate:   97 / minute BP sitting:   122 / 78  (left arm) Cuff size:   regular  Vitals Entered By: Brenton Grills MA (March 10, 2010 3:10 PM)  O2 Flow:  Room air CC: ER F/U/High BP/aj Is Patient Diabetic? Yes   CC:  ER F/U/High BP/aj.  History of Present Illness: pt has few days of severe pain rad from the lower back to the chest, and associated doe.  she was seen at [facility] long er, where pain was determined to be noncardiogenic.  question of fibromyalgia has been raised.  Current Medications (verified): 1)  Glucophage Xr 500 Mg  Tb24 (Metformin Hcl) .... Take 1 By Mouth Qd 2)  Allegra-D 12 Hour 60-120 Mg Tb12 (Fexofenadine-Pseudoephedrine) .... Take 1 Two Times A Day Prn 3)  Prilosec Otc 20 Mg Tbec (Omeprazole Magnesium) .Marland Kitchen.. 1 By Mouth Once Daily 4)  Aleve 220 Mg Caps (Naproxen Sodium) .Marland Kitchen.. 1 By Mouth Once Daily As Needed 5)  Zetia 10 Mg Tabs (Ezetimibe) .Marland Kitchen.. 1 Qd 6)  Simvastatin 80 Mg Tabs (Simvastatin) .Marland Kitchen.. 1 Qhs 7)  Losartan Potassium-Hctz 100-12.5 Mg Tabs (Losartan Potassium-Hctz) .... 1/2 Tab Once Daily 8)  Onetouch Ultra Test  Strp (Glucose Blood) .... Once Daily, and Lancets 250.00 9)  Azithromycin 500 Mg Tabs (Azithromycin) .Marland Kitchen.. 1 Once Daily 10)  Ultram 50 Mg Tabs (Tramadol Hcl) .Marland Kitchen.. 1-2 Tablets Every 6 Hours As Needed  Allergies (verified): 1)  ! Benicar (Olmesartan Medoxomil)  Past History:  Past Medical History: Last updated: 08/10/2007 Allergic rhinitis GERD Osteoporosis Smoker (Quit 1960) Uterine Fibroids Increase ALK PHOS, w/u Neg Dyslipidemia Small Thyroid Nodule Diabetes mellitus, type II thyroid nodule Hypertension Anxiety Gout  Social  History: Reviewed history from 07/25/2009 and no changes required. 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 Normal respiratory effort.  Heart:  Regular rate and rhythm without murmurs or gallops noted. Normal S1,S2.     Impression & Recommendations:  Problem # 1:  CHEST PAIN (ICD-786.50) ? fibromyalgia  Medications Added to Medication List This Visit: 1)  Ultram 50 Mg Tabs (Tramadol hcl) .Marland Kitchen.. 1-2 tablets every 6 hours as needed  Other Orders: Rheumatology Referral (Rheumatology) Radiology Referral (Radiology) Est. Patient Level III ([address])  Patient Instructions: 1)  check ct scan of the chest.  you will be called with a day and time for an appointment. 2)  refer rheumatology.  you will be called with a day and time for an appointment.

## 2010-07-29 NOTE — Progress Notes (Signed)
Summary: RESULTS  Phone Note Call from Patient Call back at [facility] Phone [phone]   Summary of Call: Pt did not understand the message on phone tree. Please explain results and I will call patient.  Initial call taken by: Lamar Sprinkles, CMA,  March 18, 2010 3:41 PM  Follow-up for Phone Call        several nodules in the chest.  it is so unlikely that these are anything to worry about, that only a repeat ct in 1 year is advised Follow-up by: Minus Breeding MD,  March 18, 2010 4:12 PM  Additional Follow-up for Phone Call Additional follow up Details #1::        Pt informed and understood Additional Follow-up by: Margaret Pyle, CMA,  March 19, 2010 8:23 AM

## 2010-07-29 NOTE — Assessment & Plan Note (Signed)
Summary: 6 MO ROV /NWS   Vital Signs:  Patient profile:   65 year old female Height:      64 inches (162.56 cm) Weight:      165.38 pounds (75.17 kg) BMI:     28.49 O2 Sat:      96 % on Room air Temp:     98.6 degrees F (37.00 degrees C) oral Pulse rate:   73 / minute BP sitting:   128 / 86  (left arm) Cuff size:   regular  Vitals Entered By: Brenton Grills MA (December 20, 2009 9:34 AM)  O2 Flow:  Room air CC: 6 mo f/u/aj   CC:  6 mo f/u/aj.  History of Present Illness: pt states 1 week of slight pain in the head, and associated fatigue.  she also has numbness of the feet, and right knee pain.    Current Medications (verified): 1)  Glucophage Xr 500 Mg  Tb24 (Metformin Hcl) .... Take 1 By Mouth Qd 2)  Accu-Chek Aviva   Strp (Glucose Blood) .... Any Brand, With Strips Qd 3)  Allegra-D 12 Hour 60-120 Mg Tb12 (Fexofenadine-Pseudoephedrine) .... Take 1 Two Times A Day Prn 4)  Prilosec Otc 20 Mg Tbec (Omeprazole Magnesium) .Marland Kitchen.. 1 By Mouth Once Daily 5)  Aleve 220 Mg Caps (Naproxen Sodium) .Marland Kitchen.. 1 By Mouth Once Daily As Needed 6)  Zetia 10 Mg Tabs (Ezetimibe) .Marland Kitchen.. 1 Qd 7)  Simvastatin 80 Mg Tabs (Simvastatin) .Marland Kitchen.. 1 Qhs 8)  Losartan Potassium-Hctz 100-12.5 Mg Tabs (Losartan Potassium-Hctz) .... 1/2 Tab Once Daily  Allergies (verified): 1)  ! Benicar (Olmesartan Medoxomil)  Past History:  Past Medical History: Last updated: 08/10/2007 Allergic rhinitis GERD Osteoporosis Smoker (Quit 1960) Uterine Fibroids Increase ALK PHOS, w/u Neg Dyslipidemia Small Thyroid Nodule Diabetes mellitus, type II thyroid nodule Hypertension Anxiety Gout  Review of Systems  The patient denies fever.         denies earache  Physical Exam  General:  normal appearance.   Head:  head: no deformity eyes: no periorbital swelling, no proptosis external nose and ears are normal mouth: no lesion seen Ears:  both tm's are red Extremities:  no deformity.  no ulcer on the feet.  feet are of  normal color and temp.  no edema right knee is normal Additional Exam:  LDL Cholesterol           77 mg/dL    Impression & Recommendations:  Problem # 1:  URI (ICD-465.9) new  Problem # 2:  HYPERCHOLESTEROLEMIA (ICD-272.0) well-controlled  Problem # 3:  FATIGUE (ICD-780.79) uncertain etiology  Medications Added to Medication List This Visit: 1)  Onetouch Ultra Test Strp (Glucose blood) .... Once daily, and lancets 250.00 2)  Azithromycin 500 Mg Tabs (Azithromycin) .Marland Kitchen.. 1 once daily  Other Orders: Est. Patient Level III ([address]) TLB-Lipid Panel (80061-LIPID) TLB-Hepatic/Liver Function Pnl (80076-HEPATIC) TLB-Sedimentation Rate (ESR) (85652-ESR) TLB-TSH (Thyroid Stimulating Hormone) (84443-TSH) TLB-BMP (Basic Metabolic Panel-BMET) (80048-METABOL) TLB-CBC Platelet - w/Differential (85025-CBCD) TLB-Uric Acid, Blood (84550-URIC) Est. Patient Level IV (03474)  Patient Instructions: 1)  azithromycin 500 mg once daily 2)  blood tests are being ordered for you today.  please call [phone] to hear your test results. 3)  Please schedule a follow-up appointment in 6 months. 4)  (update: i left message on phone-tree:  rx as we discussed) Prescriptions: AZITHROMYCIN 500 MG TABS (AZITHROMYCIN) 1 once daily  #6 x 0   Entered and Authorized by:   Minus Breeding MD   Signed  by:   Minus Breeding MD on 12/20/2009   Method used:   Electronically to        [facility] [phone]* (retail)       [address] [place], [place]  [address]       Ph: [phone]       Fax: [phone]   RxID:   [id number] [address] TEST  STRP (GLUCOSE BLOOD) once daily, and lancets 250.00  #50 x 11   Entered and Authorized by:   Minus Breeding MD   Signed by:   Minus Breeding MD on 12/20/2009   Method used:   Electronically to        [facility] [phone]* (retail)       [address] [place], [place]  [address]       Ph:  [phone]       Fax: [phone]   RxID:   [id number]

## 2010-07-29 NOTE — Progress Notes (Signed)
Summary: tramadol  Phone Note Refill Request Message from:  Fax from Pharmacy on March 21, 2010 3:55 PM  Refills Requested: Medication #1:  ULTRAM 50 MG TABS 1-2 tablets every 6 hours as needed.   Dosage confirmed as above?Dosage Confirmed   Notes: [address] fax [phone] please advise   Method Requested: Electronic Initial call taken by: Brenton Grills MA,  March 21, 2010 3:56 PM  Follow-up for Phone Call        please refill prn Follow-up by: Minus Breeding MD,  March 21, 2010 4:00 PM    Prescriptions: Janean Sark 50 MG TABS (TRAMADOL HCL) 1-2 tablets every 6 hours as needed  #50 x 0   Entered by:   Brenton Grills MA   Authorized by:   Minus Breeding MD   Signed by:   Brenton Grills MA on 03/21/2010   Method used:   Electronically to        [facility] (retail)       [address]       [place], [place]  [address]       Ph: [phone]       Fax: [phone]   RxID:   [id number]

## 2010-07-29 NOTE — Letter (Signed)
Summary: Out of Work  [facility]  [address] [place], [place] [address]   Phone: [phone]  Fax: [phone]    August 27, 2009   Employee:  [employer]    To Whom It May Concern:   For Medical reasons, please excuse the above named employee from work for the following dates:  Start:   08/26/09  End:   08/29/09   Sincerely,    Minus Breeding MD

## 2010-07-30 ENCOUNTER — Encounter: Payer: Self-pay | Admitting: Endocrinology

## 2010-07-30 ENCOUNTER — Ambulatory Visit (INDEPENDENT_AMBULATORY_CARE_PROVIDER_SITE_OTHER): Payer: MEDICARE | Admitting: Endocrinology

## 2010-07-30 DIAGNOSIS — R109 Unspecified abdominal pain: Secondary | ICD-10-CM | POA: Insufficient documentation

## 2010-07-30 DIAGNOSIS — N390 Urinary tract infection, site not specified: Secondary | ICD-10-CM

## 2010-07-30 LAB — CONVERTED CEMR LAB
Blood in Urine, dipstick: NEGATIVE
Glucose, Urine, Semiquant: NEGATIVE
Ketones, urine, test strip: NEGATIVE
Urobilinogen, UA: 0.2
pH: 8

## 2010-07-31 ENCOUNTER — Other Ambulatory Visit: Payer: Self-pay | Admitting: Endocrinology

## 2010-07-31 DIAGNOSIS — R102 Pelvic and perineal pain: Secondary | ICD-10-CM

## 2010-07-31 NOTE — Assessment & Plan Note (Signed)
Summary: 6 mos f/u #/cd   Vital Signs:  Patient profile:   65 year old female Height:      64 inches (162.56 cm) Weight:      168.38 pounds (76.54 kg) BMI:     29.01 O2 Sat:      97 % on Room air Temp:     98.7 degrees F (37.06 degrees C) oral Pulse rate:   83 / minute Pulse rhythm:   regular BP sitting:   120 / 78  (left arm) Cuff size:   regular  Vitals Entered By: Brenton Grills CMA Duncan Dull) (July 07, 2010 8:06 AM)  O2 Flow:  Room air CC: Follow-up visit/pt is no longer taking Aleve OTC/aj Is Patient Diabetic? Yes   CC:  Follow-up visit/pt is no longer taking Aleve OTC/aj.  History of Present Illness: here for regular wellness examination.  she's feeling pretty well in general, and does not drink or smoke.  Current Medications (verified): 1)  Glucophage Xr 500 Mg  Tb24 (Metformin Hcl) .... Take 1 By Mouth Qd 2)  Allegra-D 12 Hour 60-120 Mg Tb12 (Fexofenadine-Pseudoephedrine) .... Take 1 Two Times A Day Prn 3)  Prilosec Otc 20 Mg Tbec (Omeprazole Magnesium) .Marland Kitchen.. 1 By Mouth Once Daily 4)  Aleve 220 Mg Caps (Naproxen Sodium) .Marland Kitchen.. 1 By Mouth Once Daily As Needed 5)  Zetia 10 Mg Tabs (Ezetimibe) .Marland Kitchen.. 1 Qd 6)  Simvastatin 80 Mg Tabs (Simvastatin) .Marland Kitchen.. 1 Qhs 7)  Losartan Potassium-Hctz 100-12.5 Mg Tabs (Losartan Potassium-Hctz) .... 1/2 Tab Once Daily 8)  Onetouch Ultra Test  Strp (Glucose Blood) .... Once Daily, and Lancets 250.00 9)  Ultram 50 Mg Tabs (Tramadol Hcl) .Marland Kitchen.. 1-2 Tablets Every 6 Hours As Needed 10)  Naproxen 500 Mg Tabs (Naproxen) .Marland Kitchen.. 1 Tablet By Mouth Two Times A Day  Allergies (verified): 1)  ! Benicar (Olmesartan Medoxomil)  Past History:  Past Medical History: Last updated: 08/10/2007 Allergic rhinitis GERD Osteoporosis Smoker (Quit 1960) Uterine Fibroids Increase ALK PHOS, w/u Neg Dyslipidemia Small Thyroid Nodule Diabetes mellitus, type II thyroid nodule Hypertension Anxiety Gout  Family History: Reviewed history from 11/24/2007 and no  changes required. no cancer  Social History: Reviewed history from 07/25/2009 and no changes required. 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  The patient denies fever, vision loss, decreased hearing, chest pain, syncope, dyspnea on exertion, prolonged cough, headaches, abdominal pain, melena, hematochezia, severe indigestion/heartburn, hematuria, suspicious skin lesions, and depression.    Physical Exam  General:  normal appearance.   Head:  head: no deformity eyes: no periorbital swelling, no proptosis external nose and ears are normal mouth: no lesion seen Neck:  Supple without thyroid enlargement or tenderness.  Breasts:  sees gyn  Lungs:  Clear to auscultation bilaterally. Normal respiratory effort.  Heart:  Regular rate and rhythm without murmurs or gallops noted. Normal S1,S2.   Abdomen:  abdomen is soft, nontender.  no hepatosplenomegaly.   not distended.  no hernia  Rectal:  sees gyn  Genitalia:  sees gyn Msk:  muscle bulk and strength are grossly normal.  no obvious joint swelling.  gait is normal and steady  Pulses:  dorsalis pedis intact bilat.  no carotid bruit  Extremities:  no deformity.  no ulcer on the feet.  feet are of normal color and temp.  no edema mycotic toenails.   Neurologic:  cn 2-12 grossly intact.  readily moves all 4's.   sensation is intact to touch on the feet  Skin:  normal texture and temp.  no rash.  not diaphoretic  Cervical Nodes:  No significant adenopathy.  Psych:  Alert and cooperative; normal mood and affect; normal attention span and concentration.     Impression & Recommendations:  Problem # 1:  ROUTINE GENERAL MEDICAL EXAM@HEALTH  CARE FACL (ICD-V70.0)  Medications Added to Medication List This Visit: 1)  Naproxen 500 Mg Tabs (Naproxen) .Marland Kitchen.. 1 tablet by mouth two times a day 2)  Screening Mammogram   Other Orders: Pneumococcal Vaccine  (04540) Admin 1st Vaccine (98119) Vascular Clinic (Vascular) Diabetic Clinic Referral (Diabetic) TLB-Lipid Panel (80061-LIPID) TLB-BMP (Basic Metabolic Panel-BMET) (80048-METABOL) TLB-CBC Platelet - w/Differential (85025-CBCD) TLB-Hepatic/Liver Function Pnl (80076-HEPATIC) TLB-TSH (Thyroid Stimulating Hormone) (84443-TSH) TLB-Uric Acid, Blood (84550-URIC) TLB-A1C / Hgb A1C (Glycohemoglobin) (83036-A1C) TLB-Udip w/ Micro (81001-URINE) TLB-Microalbumin/Creat Ratio, Urine (82043-MALB) Est. Patient 65& > (14782)  Preventive Care Screening     gyn is dr Algie Coffer   Patient Instructions: 1)  cc labs dr Dareen Piano (rheumatol), and fogelman (gyn) 2)  please consider these measures for your health:  minimize alcohol.  do not use tobacco products.  have a colonoscopy at least every 10 years from age 61.  keep firearms safely stored.  always use seat belts.  have working smoke alarms in your home.  see an eye doctor and dentist regularly.  never drive under the influence of alcohol or drugs (including prescription drugs).  3)  please let me know what your wishes would be, if artificial life support measures should become necessary.  it is critically important to prevent falling down (keep floor areas well-lit, dry, and free of loose objects). 4)  here is a prescription for a mammogram. 5)  (update: we discussed code status.  pt requests full code, but would not want to be started or maintained on artificial life-support measures if there was not a reasonable chance of recovery) 6)  (update: i left message on phone-tree:  call if urinary sxs). Prescriptions: ZETIA 10 MG TABS (EZETIMIBE) 1 qd  #30 x 11   Entered and Authorized by:   Minus Breeding MD   Signed by:   Minus Breeding MD on 07/07/2010   Method used:   Electronically to        [facility] [phone]* (retail)       [address]       [place], [place]  [address]       Ph: [phone]       Fax: [phone]   RxID:    [id number] SCREENING MAMMOGRAM   #0 x 0   Entered and Authorized by:   Minus Breeding MD   Signed by:   Minus Breeding MD on 07/07/2010   Method used:   Print then Give to Patient   RxID:   [id number]    Orders Added: 1)  Pneumococcal Vaccine [84166] 2)  Admin 1st Vaccine [90471] 3)  Vascular Clinic [Vascular] 4)  Diabetic Clinic Referral [Diabetic] 5)  TLB-Lipid Panel [80061-LIPID] 6)  TLB-BMP (Basic Metabolic Panel-BMET) [80048-METABOL] 7)  TLB-CBC Platelet - w/Differential [85025-CBCD] 8)  TLB-Hepatic/Liver Function Pnl [80076-HEPATIC] 9)  TLB-TSH (Thyroid Stimulating Hormone) [84443-TSH] 10)  TLB-Uric Acid, Blood [84550-URIC] 11)  TLB-A1C / Hgb A1C (Glycohemoglobin) [83036-A1C] 12)  TLB-Udip w/ Micro [81001-URINE] 13)  TLB-Microalbumin/Creat Ratio, Urine [82043-MALB] 14)  Est. Patient 65& > [06301]   Immunization History:  Influenza Immunization History:  Influenza:  historical (03/29/2010)  Immunizations Administered:  Pneumonia Vaccine:    Vaccine Type: Pneumovax    Site: right deltoid    Mfr: Merck    Dose: 0.5 ml    Route: IM    Given by: Brenton Grills CMA (AAMA)    Exp. Date: 10/24/2011    Lot #: [id number]    VIS given: 06/03/09 version given July 07, 2010.   Immunization History:  Influenza Immunization History:    Influenza:  Historical (03/29/2010)  Immunizations Administered:  Pneumonia Vaccine:    Vaccine Type: Pneumovax    Site: right deltoid    Mfr: Merck    Dose: 0.5 ml    Route: IM    Given by: Brenton Grills CMA (AAMA)    Exp. Date: 10/24/2011    Lot #: [id number]    VIS given: 06/03/09 version given July 07, 2010.

## 2010-07-31 NOTE — Consult Note (Signed)
Summary: [facility]   Imported By: Sherian Rein 07/18/2010 09:35:55  _____________________________________________________________________  External Attachment:    Type:   Image     Comment:   External Document

## 2010-07-31 NOTE — Progress Notes (Signed)
Summary: UTI  Phone Note Call from Patient Call back at [facility] Phone [phone]   Caller: Patient Summary of Call: Pt called requesting Rx for UTI to [facility] [address] for urinary sxs. Pt states she is having frequency with burning. Initial call taken by: Margaret Pyle, CMA,  July 09, 2010 3:35 PM  Follow-up for Phone Call        sent Follow-up by: Minus Breeding MD,  July 10, 2010 8:15 AM    New/Updated Medications: CIPROFLOXACIN HCL 500 MG TABS (CIPROFLOXACIN HCL) 1 tab two times a day Prescriptions: CIPROFLOXACIN HCL 500 MG TABS (CIPROFLOXACIN HCL) 1 tab two times a day  #14 x 0   Entered by:   Margaret Pyle, CMA   Authorized by:   Minus Breeding MD   Signed by:   Margaret Pyle, CMA on 07/10/2010   Method used:   Electronically to        [facility] [phone]* (retail)       [address]       [place], [place]  [address]       Ph: [phone]       Fax: [phone]   RxID:   [id number] CIPROFLOXACIN HCL 500 MG TABS (CIPROFLOXACIN HCL) 1 tab two times a day  #14 x 0   Entered and Authorized by:   Minus Breeding MD   Signed by:   Minus Breeding MD on 07/10/2010   Method used:   Electronically to        [facility] [address]* (retail)       [address] [place], [place]  [address]       Ph: [phone]       Fax: [phone]   RxID:   [id number]

## 2010-08-04 ENCOUNTER — Ambulatory Visit
Admission: RE | Admit: 2010-08-04 | Discharge: 2010-08-04 | Disposition: A | Discharge status: Home / Self Care | Payer: MEDICARE | Source: Ambulatory Visit | Attending: Endocrinology | Admitting: Endocrinology

## 2010-08-04 ENCOUNTER — Ambulatory Visit: Payer: MEDICARE

## 2010-08-04 DIAGNOSIS — R102 Pelvic and perineal pain unspecified side: Secondary | ICD-10-CM

## 2010-08-06 NOTE — Progress Notes (Signed)
Summary: ABX for UTI?  Phone Note Call from Patient Call back at [facility] Phone [phone]   Caller: Patient Summary of Call: pt states that she is still having urinary sxs (slight pain, odor, discomfort) and has finished previous course of ABX and would like another week of Cipro called in to pharmacy. Informed pt that MD has advise OV but pt states that she is unable to come in-please advise Initial call taken by: Brenton Grills CMA Duncan Dull),  July 29, 2010 4:34 PM  Follow-up for Phone Call        please advise ov  Follow-up by: Minus Breeding MD,  July 29, 2010 4:37 PM  Additional Follow-up for Phone Call Additional follow up Details #1::        Appt Scheduled 07/30/2010 8:00am Additional Follow-up by: Brenton Grills CMA (AAMA),  July 30, 2010 8:14 AM

## 2010-08-06 NOTE — Assessment & Plan Note (Signed)
Summary: per Amy Escobar/uti/cd   Vital Signs:  Patient profile:   65 year old female Height:      64 inches (162.56 cm) Weight:      169.56 pounds (77.07 kg) BMI:     29.21 O2 Sat:      97 % on Room air Temp:     98.4 degrees F (36.89 degrees C) oral Pulse rate:   93 / minute BP sitting:   122 / 84  (left arm) Cuff size:   regular  Vitals Entered By: Brenton Grills CMA Duncan Dull) (July 30, 2010 8:37 AM)  O2 Flow:  Room air CC: UTI sxs/pt is no longer taking Ultram/aj Is Patient Diabetic? Yes   CC:  UTI sxs/pt is no longer taking Ultram/aj.  History of Present Illness: pt says left lower quadrant and lower back pain are improved, and dysuria is also improved.  however, they are not resolved.    Current Medications (verified): 1)  Glucophage Xr 500 Mg  Tb24 (Metformin Hcl) .... Take 1 By Mouth Qd 2)  Allegra-D 12 Hour 60-120 Mg Tb12 (Fexofenadine-Pseudoephedrine) .... Take 1 Two Times A Day Prn 3)  Prilosec Otc 20 Mg Tbec (Omeprazole Magnesium) .Marland Kitchen.. 1 By Mouth Once Daily 4)  Zetia 10 Mg Tabs (Ezetimibe) .Marland Kitchen.. 1 Qd 5)  Simvastatin 80 Mg Tabs (Simvastatin) .Marland Kitchen.. 1 Qhs 6)  Losartan Potassium-Hctz 100-12.5 Mg Tabs (Losartan Potassium-Hctz) .... 1/2 Tab Once Daily 7)  Onetouch Ultra Test  Strp (Glucose Blood) .... Once Daily, and Lancets 250.00 8)  Ultram 50 Mg Tabs (Tramadol Hcl) .Marland Kitchen.. 1-2 Tablets Every 6 Hours As Needed 9)  Naproxen 500 Mg Tabs (Naproxen) .Marland Kitchen.. 1 Tablet By Mouth Two Times A Day 10)  Screening Mammogram 11)  Ciprofloxacin Hcl 500 Mg Tabs (Ciprofloxacin Hcl) .Marland Kitchen.. 1 Tab Two Times A Day  Allergies (verified): 1)  ! Benicar (Olmesartan Medoxomil)  Past History:  Past Medical History: Last updated: 08/10/2007 Allergic rhinitis GERD Osteoporosis Smoker (Quit 1960) Uterine Fibroids Increase ALK PHOS, w/u Neg Dyslipidemia Small Thyroid Nodule Diabetes mellitus, type II thyroid nodule Hypertension Anxiety Gout  Review of Systems  The patient denies fever.     Physical Exam  General:  normal appearance.   Abdomen:  there is slight left lower quadrant tenderness   Impression & Recommendations:  Problem # 1:  PELVIC PAIN, LEFT (ICD-789.09) ? due to persistent uti  Medications Added to Medication List This Visit: 1)  Nitrofurantoin Macrocrystal 100 Mg Caps (Nitrofurantoin macrocrystal) .Marland Kitchen.. 1 tab three times a day  Other Orders: T-Urine Culture (Spectrum Order) [id number]) Radiology Referral (Radiology) Est. Patient Level III (95621)  Patient Instructions: 1)  here are some samples of diovan-hct 320/12.5, to take once daily.  they are interchangeable with your losartan-hct. 2)  check urine culture. 3)  check ultrasound of the pelvis.  you will be called with a day and time for an appointment. 4)  please call [phone] to hear each of your test results. 5)  nitrofurantion 100 mg three times a day. Prescriptions: NITROFURANTOIN MACROCRYSTAL 100 MG CAPS (NITROFURANTOIN MACROCRYSTAL) 1 tab three times a day  #21 x 0   Entered and Authorized by:   Minus Breeding MD   Signed by:   Minus Breeding MD on 07/30/2010   Method used:   Electronically to        [facility] [phone]* (retail)       [address]       [place], [place]  [address]       Ph: [phone]       Fax: [phone]   RxID:   [id number]    Orders Added: 1)  T-Urine Culture (Spectrum Order) [[id number]] 2)  Radiology Referral [Radiology] 3)  Est. Patient Level III [[address]]    Laboratory Results   Urine Tests   Date/Time Reported: 07/30/2010  Routine Urinalysis   Color: yellow Appearance: Clear Glucose: negative   (Normal Range: Negative) Bilirubin: negative   (Normal Range: Negative) Ketone: negative   (Normal Range: Negative) Spec. Gravity: 1.010   (Normal Range: 1.003-1.035) Blood: negative   (Normal Range: Negative) pH: 8.0   (Normal Range: 5.0-8.0) Protein: negative   (Normal Range: Negative) Urobilinogen: 0.2   (Normal Range:  0-1) Nitrite: negative   (Normal Range: Negative) Leukocyte Esterace: small   (Normal Range: Negative)

## 2010-08-08 ENCOUNTER — Telehealth: Payer: Self-pay | Admitting: Endocrinology

## 2010-08-14 NOTE — Progress Notes (Signed)
Summary: [place] results  ---- Converted from flag ---- ---- 08/07/2010 4:48 PM, Hilarie Fredrickson wrote: Windy Carina WITH HER RESULTS.  THEY AREN'T ON THE PHONE TREE.  [phone] ------------------------------  Pt called and informed of results. Pt's results were on the phone tree.  Brenton Grills CMA Duncan Dull)  August 08, 2010 8:05 AM

## 2010-09-01 ENCOUNTER — Telehealth: Payer: Self-pay | Admitting: Endocrinology

## 2010-09-01 ENCOUNTER — Ambulatory Visit (INDEPENDENT_AMBULATORY_CARE_PROVIDER_SITE_OTHER): Payer: MEDICARE | Admitting: Endocrinology

## 2010-09-01 ENCOUNTER — Encounter: Payer: Self-pay | Admitting: Endocrinology

## 2010-09-01 DIAGNOSIS — M79609 Pain in unspecified limb: Secondary | ICD-10-CM

## 2010-09-09 ENCOUNTER — Telehealth: Payer: Self-pay | Admitting: Endocrinology

## 2010-09-09 NOTE — Progress Notes (Signed)
Summary: Call Report  Phone Note Other Incoming   Caller: [facility] Summary of Call: [facility] Triage Call Report Triage Record Num: [id number] Operator: Estevan Oaks Patient Name: [facility] Call Date & Time: 08/31/2010 4:43:35PM Patient Phone: [phone] PCP: Romero Belling Patient Gender: Female PCP Fax : [phone] Patient DOB: 1946/04/26 Practice Name: [facility] Reason for Call: Ms Gittens is having a burning sesation in both feet. Sts all of her feet and toes are affected. Started approx 2 weeks ago and has been intermittent but worse since Friday night 3/2. She sts her BS's have been w/i desired range. Denies any other sx's. Sts she spoke with a Rph who suggested her MD may be able to call in a med to help with this. Rn explained that this if often due to neuropathy and there are meds which may treat it but would require an MD evaluation before any meds can be Rx'd. Adv'd to f/u with office tomorrow. To Ed if sx's become unbearable. She agrees. Protocol(s) Used: No Guideline - Advice Per Reference (Adult) Recommended Outcome per Protocol: See Provider within 24 hours Reason for Outcome: SEE PROVIDER WITHIN 24 HOURS Care Advice:  ~ 03/ Initial call taken by: Margaret Pyle, CMA,  September 01, 2010 8:43 AM

## 2010-09-11 LAB — CBC
Hemoglobin: 13.1 g/dL (ref 12.0–15.0)
MCH: 28.9 pg (ref 26.0–34.0)
Platelets: 223 10*3/uL (ref 150–400)
RBC: 4.54 MIL/uL (ref 3.87–5.11)
WBC: 5.9 10*3/uL (ref 4.0–10.5)

## 2010-09-11 LAB — URINALYSIS, ROUTINE W REFLEX MICROSCOPIC
Bilirubin Urine: NEGATIVE
Glucose, UA: NEGATIVE mg/dL
Hgb urine dipstick: NEGATIVE
Protein, ur: NEGATIVE mg/dL
Urobilinogen, UA: 0.2 mg/dL (ref 0.0–1.0)

## 2010-09-11 LAB — COMPREHENSIVE METABOLIC PANEL
ALT: 38 U/L — ABNORMAL HIGH (ref 0–35)
AST: 32 U/L (ref 0–37)
Albumin: 4 g/dL (ref 3.5–5.2)
Alkaline Phosphatase: 116 U/L (ref 39–117)
CO2: 29 mEq/L (ref 19–32)
Chloride: 106 mEq/L (ref 96–112)
Creatinine, Ser: 0.82 mg/dL (ref 0.4–1.2)
GFR calc Af Amer: >60 mL/min (ref >60)
GFR calc non Af Amer: >60 mL/min (ref >60)
Potassium: 3.7 mEq/L (ref 3.5–5.1)
Sodium: 142 mEq/L (ref 135–145)
Total Bilirubin: 0.4 mg/dL (ref 0.3–1.2)

## 2010-09-11 LAB — POCT CARDIAC MARKERS
Myoglobin, poc: 59.6 ng/mL (ref 12–200)
Troponin i, poc: <0.05 ng/mL (ref 0.00–0.09)

## 2010-09-11 LAB — DIFFERENTIAL
Basophils Absolute: 0 10*3/uL (ref 0.0–0.1)
Basophils Relative: 1 % (ref 0–1)
Eosinophils Absolute: 0.3 10*3/uL (ref 0.0–0.7)
Eosinophils Relative: 5 % (ref 0–5)
Lymphocytes Relative: 34 % (ref 12–46)
Monocytes Absolute: 0.7 10*3/uL (ref 0.1–1.0)

## 2010-09-11 LAB — D-DIMER, QUANTITATIVE: D-Dimer, Quant: 0.43 ug/mL-FEU (ref 0.00–0.48)

## 2010-09-11 LAB — URINE MICROSCOPIC-ADD ON

## 2010-09-16 NOTE — Progress Notes (Signed)
Summary: Please call  Phone Note Call from Patient Call back at Home Phone [phone]   Caller: Patient Summary of Call: Pt called requesting a call back from Dr Everardo All only Initial call taken by: Margaret Pyle, CMA,  September 09, 2010 10:53 AM  Follow-up for Phone Call        i called pt 09/09/10.  pt asks if i would lend her money.  i told pt this would be considered a violation of the dr-pt relationship. Follow-up by: Minus Breeding MD,  September 09, 2010 7:28 PM

## 2010-09-16 NOTE — Assessment & Plan Note (Signed)
Summary: Burning Feet   Vital Signs:  Patient profile:   65 year old female Height:      64 inches (162.56 cm) Weight:      172.25 pounds (78.30 kg) BMI:     29.67 O2 Sat:      96 % on Room air Temp:     98.6 degrees F (37.00 degrees C) oral Pulse rate:   82 / minute Pulse rhythm:   regular BP sitting:   132 / 82  (left arm) Cuff size:   regular  Vitals Entered By: Brenton Grills CMA (AAMA) (September 01, 2010 3:24 PM)  O2 Flow:  Room air CC: Burning sensation in feet/aj Is Patient Diabetic? Yes   CC:  Burning sensation in feet/aj.  History of Present Illness: pt states few weeks of moderate burning-quality pain of the feet, but no assoc numbness.  sxs are the same, day=night.  Current Medications (verified): 1)  Glucophage Xr 500 Mg  Tb24 (Metformin Hcl) .... Take 1 By Mouth Qd 2)  Allegra-D 12 Hour 60-120 Mg Tb12 (Fexofenadine-Pseudoephedrine) .... Take 1 Two Times A Day Prn 3)  Prilosec Otc 20 Mg Tbec (Omeprazole Magnesium) .Marland Kitchen.. 1 By Mouth Once Daily 4)  Zetia 10 Mg Tabs (Ezetimibe) .Marland Kitchen.. 1 Qd 5)  Simvastatin 80 Mg Tabs (Simvastatin) .Marland Kitchen.. 1 Qhs 6)  Losartan Potassium-Hctz 100-12.5 Mg Tabs (Losartan Potassium-Hctz) .... 1/2 Tab Once Daily 7)  Onetouch Ultra Test  Strp (Glucose Blood) .... Once Daily, and Lancets 250.00 8)  Ultram 50 Mg Tabs (Tramadol Hcl) .Marland Kitchen.. 1-2 Tablets Every 6 Hours As Needed 9)  Naproxen 500 Mg Tabs (Naproxen) .Marland Kitchen.. 1 Tablet By Mouth Two Times A Day 10)  Screening Mammogram  Allergies (verified): 1)  ! Benicar (Olmesartan Medoxomil)  Past History:  Past Medical History: Last updated: 08/10/2007 Allergic rhinitis GERD Osteoporosis Smoker (Quit 1960) Uterine Fibroids Increase ALK PHOS, w/u Neg Dyslipidemia Small Thyroid Nodule Diabetes mellitus, type II thyroid nodule Hypertension Anxiety Gout  Review of Systems       The patient complains of weight gain.  The patient denies hypoglycemia.    Physical Exam  General:  normal appearance.    Pulses:  dorsalis pedis intact bilat.   Extremities:  no deformity.  no ulcer on the feet.  feet are of normal color and temp.  no edema mycotic toenails.   Neurologic:  sensation is intact to touch on the feet  Additional Exam:  i reviewed past labs, including glucose and b-12   Impression & Recommendations:  Problem # 1:  FOOT PAIN, BILATERAL (ICD-729.5) uncertain etiology  Medications Added to Medication List This Visit: 1)  Gabapentin 300 Mg Caps (Gabapentin) .Marland Kitchen.. 1 tab at bedtime  Other Orders: Neurology Referral (Neuro) Est. Patient Level IV ([address])  Patient Instructions: 1)  take gabapentin 300 mg at bedtime.  this medicine has to be slowly increased, so call next week if not better, and i can increase. 2)  check "pncv" (a test done at a neurologist's office, to see how much neuropathy you have).  then please call [phone] to hear your test results. Prescriptions: GABAPENTIN 300 MG CAPS (GABAPENTIN) 1 tab at bedtime  #30 x 11   Entered and Authorized by:   Minus Breeding MD   Signed by:   Minus Breeding MD on 09/01/2010   Method used:   Electronically to        [facility] [phone]* (retail)       [phone]       [place], [place]  [address]       Ph: [phone]       Fax: [phone]   RxID:   [id number]    Orders Added: 1)  Neurology Referral [Neuro] 2)  Est. Patient Level IV [84132]     Preventive Care Screening  Mammogram:    Date:  07/14/2010    Results:  normal bilateral

## 2010-09-23 ENCOUNTER — Other Ambulatory Visit: Payer: Self-pay | Admitting: Endocrinology

## 2010-09-23 DIAGNOSIS — E119 Type 2 diabetes mellitus without complications: Secondary | ICD-10-CM

## 2010-09-23 MED ORDER — METFORMIN HCL ER 500 MG PO TB24: 500.0000 mg | ORAL_TABLET | Freq: Every day | ORAL | Status: DC

## 2010-09-23 NOTE — Telephone Encounter (Signed)
R'cd fax from [facility] for refill pt's Metformin. Last OV-09/01/2010  Last Refilled-08/03/2010

## 2010-11-11 NOTE — Assessment & Plan Note (Signed)
[facility]                                 ON-CALL NOTE   Amy Escobar, Amy Escobar                     MRN:          [id number]  DATE:12/10/2006                            DOB:          1945-07-26    Phone number [phone].   OBJECTIVE:  Prescription was not called in.  Patient was seen today.  Was to have medicines called in for a cold and virus.  She is having  nausea and diarrhea.  Uses [facility].   ASSESSMENT:  Probable viral syndrome.   PLAN:  Suggested she not take anything for diarrhea other than Imodium  over the counter if needed.  For the nausea, go to clear liquids for the  next 24 hours, then bananas, rice, apples, and toast, and then progress  as able.  Stay away from milk and milk products for 24 hours.  Called in  prescription for Phenergan 25 mg q.6 hours p.r.n., 10 and no refills to  [facility] at [phone].     Arta Silence, MD  Electronically Signed    RNS/MedQ  DD: 12/10/2006  DT: 12/11/2006  Job #: [id number]   cc:   Gregary Signs A. Everardo All, MD

## 2010-11-14 NOTE — Assessment & Plan Note (Signed)
[facility]                        [place] OFFICE NOTE   Amy Escobar, Amy Escobar                   MRN:          [id number]  DATE:07/10/2006                            DOB:          06-Feb-1946    PRIMARY CARE PHYSICIAN:  Dr. Romero Belling.   REASON FOR VISIT:  Congestion.   Amy Escobar is a 65 year old female presenting with a 2 week history  of sinus pressure, post-nasal drip, and ear pressure. She also reports  that she has a red lesion on the right side of her cheek. She said it is  not painful and it has been present for about a week. It is not growing  in size. At the end of the visit, she also reported 2 episodes of chest  pain, most recent being last night. She described the pain as gas. She  states that it was sharp in nature. She would not say it was pressure,  but was unequivocal on tightness.  She did report that it lasted for  about an hour. She has had previous symptoms in the past that were  relieved with belching. The patient does have a significant history of  hypertension and hyperlipidemia. She is currently off her blood pressure  medicine.   PAST MEDICAL HISTORY:  1. Hypertension.  2. Anxiety.  3. Gastroesophageal reflux.  4. Hyperlipidemia.   ALLERGIES:  No known drug allergies.   REVIEW OF SYSTEMS:  Negative for constitutional symptoms. ENT as per  HPI. Cardiovascular wise, she denied any shortness of breath, dyspnea on  exertion, palpitations, claudications, lower extremity edema, or  swelling. Respiratory wise, she did complain of a mild cough with yellow  sputum production. The rest of the systems are unremarkable except for  skin which was stated above in the HPI.   OBJECTIVE:  VITAL SIGNS: Temperature 98, pulse 76, blood pressure  152/86.  GENERAL: This is a pleasant female, asymptomatic regarding chest pain  and in no acute distress.  HEENT: Nasal mucosa was boggy with swollen turbinates and yellow  nasal  discharge. Oral pharynx was slightly erythematous with post-nasal drip.  NECK: Supple, but no lymphadenopathy, carotid bruits or JVD.  LUNGS: Clear.  HEART: Regular rate and rhythm, normal S1, S2, no murmurs, gallops, or  rubs on examination.  SKIN: Significant for a 1 cm - 1 1/2 cm arid erythema on the right  cheek, nontender to palpation, slightly scaly.   EKG showed normal sinus rhythm with no ST elevations or depression. No Q-  waves, no PVCs or PACs.   IMPRESSION:  1. Rhinosinusitis/URI.  2. Chest pain with a history of hypertension and hyperlipidemia.   PLAN:  1. Regarding her reason for visit I did a provide a prescription for      Augmentin 875 mg b.i.d. Regarding her chest pain, I did advise that      I am concerned about her heart given her history of hypertension      and hyperlipidemia. The patient was adamant that it was gas and      that it was unlikely to be her heart. She did agree to going  to the      emergency department if her symptoms recur, to followup with Dr.      Everardo All on Monday to be reassessed and referral to cardiology if      deemed necessary.  2. In regards to the area on her face, I advised her to monitor it      closely for any significant change and to have it reassessed by Dr.      Everardo All next week or recommend using Lotrimin b.i.d. to the area      given that it is scaly in nature and could be tinea.     Leanne Chang, M.D.  Electronically Signed    LA/MedQ  DD: 07/10/2006  DT: 07/11/2006  Job #: [id number]   cc:   Gregary Signs A. Everardo All, MD

## 2010-11-19 ENCOUNTER — Other Ambulatory Visit: Payer: Self-pay

## 2010-11-19 MED ORDER — ONETOUCH DELICA LANCETS MISC: 1.0000 | Freq: Three times a day (TID) | Status: DC

## 2010-11-19 MED ORDER — GLUCOSE BLOOD VI STRP: 1.0000 | ORAL_STRIP | Freq: Three times a day (TID) | Status: DC

## 2010-12-06 ENCOUNTER — Ambulatory Visit (INDEPENDENT_AMBULATORY_CARE_PROVIDER_SITE_OTHER): Payer: Medicare Other | Admitting: Family Medicine

## 2010-12-06 ENCOUNTER — Encounter: Payer: Self-pay | Admitting: Family Medicine

## 2010-12-06 VITALS — BP 128/82 | HR 73 | Temp 98.5°F | Wt 165.0 lb

## 2010-12-06 DIAGNOSIS — J329 Chronic sinusitis, unspecified: Secondary | ICD-10-CM

## 2010-12-06 MED ORDER — CEFUROXIME AXETIL 500 MG PO TABS: 500.0000 mg | ORAL_TABLET | Freq: Two times a day (BID) | ORAL | Status: AC

## 2010-12-06 NOTE — Patient Instructions (Signed)

## 2010-12-06 NOTE — Progress Notes (Signed)
  Subjective:     Amy Escobar is a 65 y.o. female who presents for evaluation of symptoms of a URI. Symptoms include achiness, congestion, cough described as productive, nasal congestion, no  fever, post nasal drip, sinus pressure, sneezing and sore throat. Onset of symptoms was 4 days ago, and has been gradually worsening since that time. Treatment to date: cough suppressants.  The following portions of the patient's history were reviewed and updated as appropriate: allergies, current medications, past family history, past medical history, past social history, past surgical history and problem list.  Review of Systems Pertinent items are noted in HPI.   Objective:    BP 128/82  Pulse 73  Temp(Src) 98.5 F (36.9 C) (Oral)  Wt 165 lb (74.844 kg) General appearance: alert, cooperative, appears stated age and no distress Ears: normal TM's and external ear canals both ears Nose: Nares normal. Septum midline. Mucosa normal. No drainage or sinus tenderness., mild congestion, turbinates red, swollen Throat: abnormal findings: mild oropharyngeal erythema and pnd Neck: mild anterior cervical adenopathy, supple, symmetrical, trachea midline and thyroid not enlarged, symmetric, no tenderness/mass/nodules Lungs: clear to auscultation bilaterally Heart: regular rate and rhythm, S1, S2 normal, no murmur, click, rub or gallop Extremities: extremities normal, atraumatic, no cyanosis or edema Lymph nodes: Cervical adenopathy: B/L   Assessment:    sinusitis   Plan:    Discussed the diagnosis and treatment of sinusitis. Suggested symptomatic OTC remedies. Nasal saline spray for congestion. Ceftin per orders. Follow up as needed.

## 2010-12-12 ENCOUNTER — Telehealth: Payer: Self-pay | Admitting: *Deleted

## 2010-12-12 NOTE — Telephone Encounter (Signed)
[facility] was [facility] and was diagnosed with a sinus infection. Pt was rx'd Ceftin 500mg  bid and has been taking medication has prescribed. Pt states that she is experiencing a SE of decreased urine volume and flow and want to know if she should take ATB only once daily-SAE pt-please advise in his absence-thanks

## 2010-12-12 NOTE — Telephone Encounter (Signed)
This does not sound like a ceftin SE but IF IT IS it means kidney damage - she needs to be seen

## 2010-12-12 NOTE — Telephone Encounter (Signed)
Pt informed of MD's advisement and states she will callback to schedule appointment this afternoon or will come [facility]

## 2011-01-09 ENCOUNTER — Other Ambulatory Visit: Payer: Self-pay | Admitting: Endocrinology

## 2011-01-29 ENCOUNTER — Ambulatory Visit: Payer: Medicare Other | Admitting: Endocrinology

## 2011-01-29 ENCOUNTER — Telehealth: Payer: Self-pay

## 2011-01-29 NOTE — Telephone Encounter (Signed)
Ov tomorrow.  Ov is due anyway

## 2011-01-29 NOTE — Telephone Encounter (Signed)
Pt called stating he great toe nail came of during the nigh last night, no pain or bleeding. Pt is concerned because she is a Diabetic and is requesting advisement from DM, OV or podiatry referral?

## 2011-01-29 NOTE — Telephone Encounter (Signed)
Pt advised and transferred to schedule appt.  

## 2011-01-30 ENCOUNTER — Encounter: Payer: Self-pay | Admitting: Endocrinology

## 2011-01-30 ENCOUNTER — Ambulatory Visit (INDEPENDENT_AMBULATORY_CARE_PROVIDER_SITE_OTHER): Payer: Medicare Other | Admitting: Endocrinology

## 2011-01-30 DIAGNOSIS — E119 Type 2 diabetes mellitus without complications: Secondary | ICD-10-CM

## 2011-01-30 DIAGNOSIS — Z79899 Other long term (current) drug therapy: Secondary | ICD-10-CM

## 2011-01-30 NOTE — Patient Instructions (Addendum)
blood tests are being ordered for you today.  please call [phone] to hear your test results.  You will be prompted to enter the 9-digit "MRN" number that appears at the top left of this page, followed by #.  Then you will hear the message. You should consider taking a special antibiotic against toenail fungus.  Please call if you decide to take it.   Please make a regular physical appointment in 6 months.

## 2011-01-30 NOTE — Progress Notes (Signed)
Subjective:    Patient ID: Amy Escobar, female    DOB: 04/21/1946, 65 y.o.   MRN: [id number]  HPI Pt states few days of slight discomfort at the right great toenail area.  She has assoc loss of the right great toenail.  She says her podiatrist checked a test for fungus 2 years ago, and that the test was negative.   Past Medical History  Diagnosis Date  . Hyperlipidemia   . DM2 (diabetes mellitus, type 2)   . Hypertension   . Allergic rhinitis   . GERD (gastroesophageal reflux disease)   . Osteoporosis   . Uterine fibroid   . Alkaline phosphatase deficiency     w/u Ne  . Dyslipidemia   . Thyroid nodule     small  . Gout   . Anxiety     Past Surgical History  Procedure Date  . Removed tumors from foot nerves 04/1999  . Stress cardiolite 02/12/2006  . Echocardiogram (other) 01/16/2002    History   Social History  . Marital Status: Widowed    Spouse Name: N/A    Number of Children: N/A  . Years of Education: N/A   Occupational History  . Not on file.   Social History Main Topics  . Smoking status: Former Games developer  . Smokeless tobacco: Not on file  . Alcohol Use: Yes  . Drug Use:   . Sexually Active:    Other Topics Concern  . Not on file   Social History Narrative   Widowed 2010.     Current Outpatient Prescriptions on File Prior to Visit  Medication Sig Dispense Refill  . ezetimibe (ZETIA) 10 MG tablet Take 10 mg by mouth daily.        . fexofenadine-pseudoephedrine (ALLEGRA-D 12 HOUR) 60-120 MG per tablet Take 1 tablet by mouth 2 (two) times daily as needed.        . gabapentin (NEURONTIN) 300 MG capsule Take 300 mg by mouth at bedtime.        Marland Kitchen glucose blood (ONE TOUCH ULTRA TEST) test strip 1 each by Other route 3 (three) times daily. Use as instructed  100 each  11  . losartan-hydrochlorothiazide (HYZAAR) 100-12.5 MG per tablet Take 1 tablet by mouth daily.        . metFORMIN (GLUCOPHAGE XR) 500 MG 24 hr tablet Take 1 tablet (500 mg total) by mouth  daily with breakfast.  30 tablet  5  . naproxen (NAPROSYN) 500 MG tablet Take 500 mg by mouth 2 (two) times daily as needed.        Marland Kitchen omeprazole (PRILOSEC) 20 MG capsule Take 20 mg by mouth daily.        Letta Pate DELICA LANCETS MISC 1 each by Does not apply route 3 (three) times daily.  100 each  11  . simvastatin (ZOCOR) 80 MG tablet TAKE ONE TABLET BY MOUTH DAILY AT BEDTIME  90 tablet  2  . traMADol (ULTRAM) 50 MG tablet Take 50 mg by mouth every 6 (six) hours as needed.          Allergies  Allergen Reactions  . Olmesartan Medoxomil     REACTION: headache    No family history on file.  BP 128/82  Pulse 89  Temp(Src) 98 F (36.7 C) (Oral)  Ht 5\' 4"  (1.626 m)  Wt 167 lb (75.751 kg)  BMI 28.67 kg/m2  SpO2 96%    Review of Systems She has slight weight gain.  No numbness.  Objective:   Physical Exam Pulses: dorsalis pedis intact bilat.   Feet: no deformity.  no ulcer on the feet.  feet are of normal color and temp.  no edema.  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  The right great toenail is absent.   Neuro: sensation is intact to touch on the feet  Lab Results  Component Value Date   HGBA1C 6.0 07/07/2010      Assessment & Plan:  Onychomycosis, new Dm, well-controlled

## 2011-02-02 ENCOUNTER — Other Ambulatory Visit (INDEPENDENT_AMBULATORY_CARE_PROVIDER_SITE_OTHER): Payer: Medicare Other

## 2011-02-02 DIAGNOSIS — Z79899 Other long term (current) drug therapy: Secondary | ICD-10-CM

## 2011-02-02 DIAGNOSIS — E119 Type 2 diabetes mellitus without complications: Secondary | ICD-10-CM

## 2011-02-02 LAB — HEMOGLOBIN A1C: Hgb A1c MFr Bld: 5.8 % (ref 4.6–6.5)

## 2011-02-02 LAB — HEPATIC FUNCTION PANEL
ALT: 28 U/L (ref 0–35)
AST: 25 U/L (ref 0–37)
Albumin: 4.1 g/dL (ref 3.5–5.2)

## 2011-02-17 ENCOUNTER — Telehealth: Payer: Self-pay

## 2011-02-17 DIAGNOSIS — E119 Type 2 diabetes mellitus without complications: Secondary | ICD-10-CM

## 2011-02-17 NOTE — Telephone Encounter (Signed)
Pt called requesting a referral to a podiatrist

## 2011-02-17 NOTE — Telephone Encounter (Signed)
sent 

## 2011-02-17 NOTE — Telephone Encounter (Signed)
Pt advised and will expect a call from [facility] with appt info 

## 2011-03-19 ENCOUNTER — Telehealth: Payer: Self-pay | Admitting: *Deleted

## 2011-03-19 NOTE — Telephone Encounter (Signed)
Pt called stating that he needs letter for new employer stating that she has had CPX this year and that she is in good health.

## 2011-03-20 ENCOUNTER — Encounter: Payer: Self-pay | Admitting: Endocrinology

## 2011-03-20 NOTE — Telephone Encounter (Signed)
i printed letter 

## 2011-03-20 NOTE — Telephone Encounter (Signed)
Pt aware and will pickup letter when she comes in on Monday for injection visit.

## 2011-03-23 ENCOUNTER — Ambulatory Visit (INDEPENDENT_AMBULATORY_CARE_PROVIDER_SITE_OTHER): Payer: Medicare Other | Admitting: *Deleted

## 2011-03-23 DIAGNOSIS — Z111 Encounter for screening for respiratory tuberculosis: Secondary | ICD-10-CM

## 2011-03-23 DIAGNOSIS — Z23 Encounter for immunization: Secondary | ICD-10-CM

## 2011-03-25 ENCOUNTER — Encounter: Payer: Self-pay | Admitting: *Deleted

## 2011-04-13 ENCOUNTER — Other Ambulatory Visit: Payer: Self-pay | Admitting: *Deleted

## 2011-04-13 DIAGNOSIS — E119 Type 2 diabetes mellitus without complications: Secondary | ICD-10-CM

## 2011-04-13 MED ORDER — METFORMIN HCL ER 500 MG PO TB24: 500.0000 mg | ORAL_TABLET | Freq: Every day | ORAL | Status: DC

## 2011-04-13 NOTE — Telephone Encounter (Signed)
R'cd fax from [facility] for refill of Metformin  Last OV-01/30/2011  Last filled-04/12/2011

## 2011-04-17 ENCOUNTER — Other Ambulatory Visit: Payer: Self-pay | Admitting: Endocrinology

## 2011-05-08 ENCOUNTER — Encounter: Payer: Self-pay | Admitting: Internal Medicine

## 2011-05-08 ENCOUNTER — Ambulatory Visit (INDEPENDENT_AMBULATORY_CARE_PROVIDER_SITE_OTHER): Payer: Medicare Other | Admitting: Internal Medicine

## 2011-05-08 VITALS — BP 100/70 | HR 74 | Temp 98.6°F | Wt 162.0 lb

## 2011-05-08 DIAGNOSIS — I10 Essential (primary) hypertension: Secondary | ICD-10-CM

## 2011-05-08 DIAGNOSIS — J069 Acute upper respiratory infection, unspecified: Secondary | ICD-10-CM

## 2011-05-08 MED ORDER — HYDROCODONE-HOMATROPINE 5-1.5 MG/5ML PO SYRP: 5.0000 mL | ORAL_SOLUTION | Freq: Four times a day (QID) | ORAL | Status: AC | PRN

## 2011-05-08 NOTE — Patient Instructions (Signed)
It was good to see you today. Use prescription cough medication for nighttime symptoms - Your prescription(s) have been submitted to your pharmacy. Please take as directed and contact our office if you believe you are having problem(s) with the medication(s). If you develop worsening symptoms or fever, call and we can reconsider antibiotics, but it does not appear necessary to use antibiotics at this time. Continue Naprosyn for aches and pains and Coricidin or nasal congestion/cough

## 2011-05-08 NOTE — Progress Notes (Signed)
  Subjective:    Patient ID: Amy Escobar, female    DOB: [birth year], 65 y.o.   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 Hyperlipidemia   . DM2 (diabetes mellitus, type 2)   . Hypertension   . Allergic rhinitis   . GERD (gastroesophageal reflux disease)   . Osteoporosis   . Uterine fibroid   . Alkaline phosphatase deficiency     w/u Ne  . Dyslipidemia   . Thyroid nodule     small  . Gout   . Anxiety     Review of Systems  Constitutional: Negative for fever and fatigue.  Respiratory: Positive for cough. Negative for shortness of breath and wheezing.   Cardiovascular: Negative for chest pain and leg swelling.       Objective:   Physical Exam BP 100/70  Pulse 74  Temp(Src) 98.6 F (37 C) (Oral)  Wt 162 lb (73.483 kg)  SpO2 96% Constitutional: She appears well-developed and well-nourished. No distress.  HENT: Head: Normocephalic and atraumatic. Ears: B TMs ok, no erythema or effusion; Nose: Nose normal.  Mouth/Throat: Oropharynx is clear and moist. No oropharyngeal exudate.  Eyes: Conjunctivae and EOM are normal. Pupils are equal, round, and reactive to light. No scleral icterus.  Neck: Normal range of motion. Neck supple. No JVD present. No thyromegaly present.  Cardiovascular: Normal rate, regular rhythm and normal heart sounds.  No murmur heard. No BLE edema. Pulmonary/Chest: Effort normal and breath sounds normal. No respiratory distress. She has no wheezes.  Neurological: She is alert and oriented to person, place, and time. No cranial nerve deficit. Coordination normal.   Lab Results  Component Value Date   WBC 5.7 07/07/2010   HGB 13.6 07/07/2010   HCT 40.3 07/07/2010   PLT 214.0 07/07/2010   GLUCOSE 94 07/07/2010   CHOL 132 07/07/2010   TRIG 68.0 07/07/2010   HDL 43.00 07/07/2010   LDLDIRECT 165.6 06/19/2009   LDLCALC 75 07/07/2010   ALT 28 02/02/2011   AST 25 02/02/2011   NA 142 07/07/2010   K 3.9 07/07/2010   CL 105 07/07/2010   CREATININE 0.7 07/07/2010   BUN 11 07/07/2010   CO2 28 07/07/2010   TSH 0.90 07/07/2010   HGBA1C 5.8 02/02/2011   MICROALBUR 0.6 07/07/2010       Assessment & Plan:  URI, viral -  Cough HTN  Explain the lack of efficacy and viral disease and no indication for empiric antibiotics symptomatic care for her symptoms including Coricidin for nasal congestion and narcotic cough suppression per night symptoms Hydration and continued NSAIDs as ongoing for muscle skeletal pain

## 2011-05-18 ENCOUNTER — Ambulatory Visit (INDEPENDENT_AMBULATORY_CARE_PROVIDER_SITE_OTHER): Payer: Medicare Other | Admitting: Endocrinology

## 2011-05-18 ENCOUNTER — Encounter: Payer: Self-pay | Admitting: Endocrinology

## 2011-05-18 VITALS — BP 128/82 | HR 92 | Temp 98.4°F | Ht 64.0 in | Wt 162.4 lb

## 2011-05-18 DIAGNOSIS — H6690 Otitis media, unspecified, unspecified ear: Secondary | ICD-10-CM

## 2011-05-18 DIAGNOSIS — H6691 Otitis media, unspecified, right ear: Secondary | ICD-10-CM

## 2011-05-18 MED ORDER — CEFUROXIME AXETIL 250 MG PO TABS: 250.0000 mg | ORAL_TABLET | Freq: Two times a day (BID) | ORAL | Status: DC

## 2011-05-18 NOTE — Progress Notes (Signed)
Subjective:    Patient ID: Amy Escobar, female    DOB: 1945-07-03, 65 y.o.   MRN: [id number]  HPI Pt states 2 weeks of congestion in the nose, and assoc myalgias.   Past Medical History  Diagnosis Date  . Hyperlipidemia   . DM2 (diabetes mellitus, type 2)   . Hypertension   . Allergic rhinitis   . GERD (gastroesophageal reflux disease)   . Osteoporosis   . Uterine fibroid   . Alkaline phosphatase deficiency     w/u Ne  . Dyslipidemia   . Thyroid nodule     small  . Gout   . Anxiety     Past Surgical History  Procedure Date  . Removed tumors from foot nerves 04/1999  . Stress cardiolite 02/12/2006  . Echocardiogram (other) 01/16/2002    History   Social History  . Marital Status: Widowed    Spouse Name: N/A    Number of Children: N/A  . Years of Education: N/A   Occupational History  . Not on file.   Social History Main Topics  . Smoking status: Former Games developer  . Smokeless tobacco: Not on file  . Alcohol Use: Yes  . Drug Use:   . Sexually Active:    Other Topics Concern  . Not on file   Social History Narrative   Widowed 2010.     Current Outpatient Prescriptions on File Prior to Visit  Medication Sig Dispense Refill  . ezetimibe (ZETIA) 10 MG tablet Take 10 mg by mouth daily.        . fexofenadine-pseudoephedrine (ALLEGRA-D 12 HOUR) 60-120 MG per tablet Take 1 tablet by mouth 2 (two) times daily as needed.        . gabapentin (NEURONTIN) 300 MG capsule Take 300 mg by mouth at bedtime.        Marland Kitchen glucose blood (ONE TOUCH ULTRA TEST) test strip 1 each by Other route 3 (three) times daily. Use as instructed  100 each  11  . metFORMIN (GLUCOPHAGE XR) 500 MG 24 hr tablet Take 1 tablet (500 mg total) by mouth daily with breakfast.  30 tablet  5  . naproxen (NAPROSYN) 500 MG tablet Take 500 mg by mouth 2 (two) times daily as needed.        Marland Kitchen omeprazole (PRILOSEC) 20 MG capsule Take 20 mg by mouth daily.        Letta Pate DELICA LANCETS MISC 1 each by Does  not apply route 3 (three) times daily.  100 each  11  . simvastatin (ZOCOR) 80 MG tablet TAKE ONE TABLET BY MOUTH DAILY AT BEDTIME  90 tablet  2  . traMADol (ULTRAM) 50 MG tablet Take 50 mg by mouth every 6 (six) hours as needed.        . valsartan-hydrochlorothiazide (DIOVAN-HCT) 320-12.5 MG per tablet Take by mouth daily. TAKE 1/2 TAB DAILY       . HYDROcodone-homatropine (HYDROMET) 5-1.5 MG/5ML syrup Take 5 mLs by mouth every 6 (six) hours as needed for cough.  120 mL  0    Allergies  Allergen Reactions  . Olmesartan Medoxomil     REACTION: headache    No family history on file.  BP 128/82  Pulse 92  Temp(Src) 98.4 F (36.9 C) (Oral)  Ht 5\' 4"  (1.626 m)  Wt 162 lb 6.4 oz (73.664 kg)  BMI 27.88 kg/m2  SpO2 97%    Review of Systems She has low-grade fever and diarrhea.  Objective:   Physical Exam VITAL SIGNS:  See vs page GENERAL: no distress head: no deformity eyes: no periorbital swelling, no proptosis external nose and ears are normal mouth: no lesion seen right tm is red, but left is normal LUNGS:  Clear to auscultation       Assessment & Plan:  Right aom, new

## 2011-05-18 NOTE — Patient Instructions (Addendum)
i have sent a prescription to your pharmacy, for an antibiotic. allegra-d (non-prescription) will help your congestion. I hope you feel better soon.  If you don't feel better by next week, please back.

## 2011-05-25 ENCOUNTER — Encounter: Payer: Self-pay | Admitting: Endocrinology

## 2011-05-25 ENCOUNTER — Ambulatory Visit (INDEPENDENT_AMBULATORY_CARE_PROVIDER_SITE_OTHER)
Admission: RE | Admit: 2011-05-25 | Discharge: 2011-05-25 | Disposition: A | Discharge status: Home / Self Care | Payer: Medicare Other | Source: Ambulatory Visit | Attending: Endocrinology | Admitting: Endocrinology

## 2011-05-25 ENCOUNTER — Ambulatory Visit (INDEPENDENT_AMBULATORY_CARE_PROVIDER_SITE_OTHER): Payer: Medicare Other | Admitting: Endocrinology

## 2011-05-25 VITALS — BP 124/76 | HR 89 | Temp 98.1°F | Ht 64.0 in | Wt 166.5 lb

## 2011-05-25 DIAGNOSIS — J209 Acute bronchitis, unspecified: Secondary | ICD-10-CM

## 2011-05-25 DIAGNOSIS — R05 Cough: Secondary | ICD-10-CM

## 2011-05-25 DIAGNOSIS — R071 Chest pain on breathing: Secondary | ICD-10-CM

## 2011-05-25 DIAGNOSIS — R059 Cough, unspecified: Secondary | ICD-10-CM

## 2011-05-25 MED ORDER — HYDROCODONE-ACETAMINOPHEN 5-325 MG PO TABS: 1.0000 | ORAL_TABLET | ORAL | Status: AC | PRN

## 2011-05-25 NOTE — Patient Instructions (Addendum)
A chest-x-ray is requested for you today.  please call [phone] to hear your test results.  You will be prompted to enter the 9-digit "MRN" number that appears at the top left of this page, followed by #.  Then you will hear the message. Here is a prescription for pain medication.  I hope you feel better soon.  If you don't feel better by next week, please back.   (update: i left message on phone-tree:  rx as we discussed)

## 2011-05-25 NOTE — Progress Notes (Signed)
Subjective:    Patient ID: Amy Escobar, female    DOB: 05/30/1946, 65 y.o.   MRN: [id number]  HPI Pt states 1 wee of intermittent moderate pain at the left lateral chest-wall, in the context of cough, but no assoc sob.   Past Medical History  Diagnosis Date  . Hyperlipidemia   . DM2 (diabetes mellitus, type 2)   . Hypertension   . Allergic rhinitis   . GERD (gastroesophageal reflux disease)   . Osteoporosis   . Uterine fibroid   . Alkaline phosphatase deficiency     w/u Ne  . Dyslipidemia   . Thyroid nodule     small  . Gout   . Anxiety     Past Surgical History  Procedure Date  . Removed tumors from foot nerves 04/1999  . Stress cardiolite 02/12/2006  . Echocardiogram (other) 01/16/2002    History   Social History  . Marital Status: Widowed    Spouse Name: N/A    Number of Children: N/A  . Years of Education: N/A   Occupational History  . Not on file.   Social History Main Topics  . Smoking status: Former Games developer  . Smokeless tobacco: Not on file  . Alcohol Use: Yes  . Drug Use:   . Sexually Active:    Other Topics Concern  . Not on file   Social History Narrative   Widowed 2010.     Current Outpatient Prescriptions on File Prior to Visit  Medication Sig Dispense Refill  . ezetimibe (ZETIA) 10 MG tablet Take 10 mg by mouth daily.        . fexofenadine-pseudoephedrine (ALLEGRA-D 12 HOUR) 60-120 MG per tablet Take 1 tablet by mouth 2 (two) times daily as needed.        . gabapentin (NEURONTIN) 300 MG capsule Take 300 mg by mouth at bedtime.        Marland Kitchen glucose blood (ONE TOUCH ULTRA TEST) test strip 1 each by Other route 3 (three) times daily. Use as instructed  100 each  11  . metFORMIN (GLUCOPHAGE XR) 500 MG 24 hr tablet Take 1 tablet (500 mg total) by mouth daily with breakfast.  30 tablet  5  . naproxen (NAPROSYN) 500 MG tablet Take 500 mg by mouth 2 (two) times daily as needed.        Marland Kitchen omeprazole (PRILOSEC) 20 MG capsule Take 20 mg by mouth daily.         Letta Pate DELICA LANCETS MISC 1 each by Does not apply route 3 (three) times daily.  100 each  11  . simvastatin (ZOCOR) 80 MG tablet TAKE ONE TABLET BY MOUTH DAILY AT BEDTIME  90 tablet  2  . traMADol (ULTRAM) 50 MG tablet Take 50 mg by mouth every 6 (six) hours as needed.        . valsartan-hydrochlorothiazide (DIOVAN-HCT) 320-12.5 MG per tablet Take by mouth daily. TAKE 1/2 TAB DAILY         Allergies  Allergen Reactions  . Olmesartan Medoxomil     REACTION: headache    No family history on file.  BP 124/76  Pulse 89  Temp(Src) 98.1 F (36.7 C) (Oral)  Ht 5\' 4"  (1.626 m)  Wt 166 lb 8 oz (75.524 kg)  BMI 28.58 kg/m2  SpO2 99%  Review of Systems Denies fever.  Nasal congestion has resolved.    Objective:   Physical Exam VITAL SIGNS:  See vs page GENERAL: no distress Chest wall: nontender  LUNGS:  Clear to auscultation   CXR: NAD    Assessment & Plan:  Acute bronchitis, new

## 2011-06-14 ENCOUNTER — Other Ambulatory Visit: Payer: Self-pay | Admitting: Endocrinology

## 2011-06-15 ENCOUNTER — Other Ambulatory Visit: Payer: Self-pay

## 2011-06-15 NOTE — Telephone Encounter (Signed)
Pt requests refill of pain medications, she states she has upcoming appt with Ortho.

## 2011-06-15 NOTE — Telephone Encounter (Signed)
Please advise-last written 05/25/2011 #30 with 0 refills

## 2011-06-15 NOTE — Telephone Encounter (Signed)
Rx faxed to pharmacy  

## 2011-06-15 NOTE — Telephone Encounter (Signed)
Rx faxed to [facility].  

## 2011-07-02 ENCOUNTER — Other Ambulatory Visit: Payer: Self-pay | Admitting: Endocrinology

## 2011-07-02 DIAGNOSIS — Z1231 Encounter for screening mammogram for malignant neoplasm of breast: Secondary | ICD-10-CM

## 2011-07-03 ENCOUNTER — Other Ambulatory Visit: Payer: Self-pay

## 2011-07-03 MED ORDER — HYDROCODONE-ACETAMINOPHEN 5-325 MG PO TABS: 1.0000 | ORAL_TABLET | ORAL | Status: DC | PRN

## 2011-07-03 NOTE — Telephone Encounter (Signed)
Pt called stating she has a scheduled appt with Ortho Dr Dareen Piano 01/17. Pt is requesting a refill of pain medications to last until appt.

## 2011-07-03 NOTE — Telephone Encounter (Signed)
Rx faxed to pharmacy  

## 2011-07-16 ENCOUNTER — Ambulatory Visit: Payer: Medicare Other

## 2011-07-22 ENCOUNTER — Ambulatory Visit: Payer: Medicare Other

## 2011-07-23 ENCOUNTER — Ambulatory Visit: Payer: Medicare Other

## 2011-07-23 ENCOUNTER — Ambulatory Visit
Admission: RE | Admit: 2011-07-23 | Discharge: 2011-07-23 | Disposition: A | Discharge status: Home / Self Care | Payer: Medicare Other | Source: Ambulatory Visit | Attending: Endocrinology | Admitting: Endocrinology

## 2011-07-23 DIAGNOSIS — Z1231 Encounter for screening mammogram for malignant neoplasm of breast: Secondary | ICD-10-CM

## 2011-07-31 ENCOUNTER — Telehealth: Payer: Self-pay | Admitting: *Deleted

## 2011-07-31 ENCOUNTER — Other Ambulatory Visit (INDEPENDENT_AMBULATORY_CARE_PROVIDER_SITE_OTHER): Payer: Medicare Other

## 2011-07-31 DIAGNOSIS — Z0000 Encounter for general adult medical examination without abnormal findings: Secondary | ICD-10-CM

## 2011-07-31 DIAGNOSIS — E119 Type 2 diabetes mellitus without complications: Secondary | ICD-10-CM

## 2011-07-31 LAB — CBC WITH DIFFERENTIAL/PLATELET
Basophils Relative: 0.6 % (ref 0.0–3.0)
Eosinophils Relative: 3.1 % (ref 0.0–5.0)
Lymphocytes Relative: 33.4 % (ref 12.0–46.0)
MCV: 87.7 fl (ref 78.0–100.0)
Monocytes Relative: 9.4 % (ref 3.0–12.0)
Neutrophils Relative %: 53.5 % (ref 43.0–77.0)
RBC: 4.61 Mil/uL (ref 3.87–5.11)
WBC: 7.2 10*3/uL (ref 4.5–10.5)

## 2011-07-31 LAB — BASIC METABOLIC PANEL
BUN: 13 mg/dL (ref 6–23)
CO2: 28 mEq/L (ref 19–32)
Chloride: 105 mEq/L (ref 96–112)
Glucose, Bld: 104 mg/dL — ABNORMAL HIGH (ref 70–99)
Potassium: 4 mEq/L (ref 3.5–5.1)

## 2011-07-31 LAB — URINALYSIS, ROUTINE W REFLEX MICROSCOPIC
Nitrite: NEGATIVE
Specific Gravity, Urine: 1.01 (ref 1.000–1.030)
Total Protein, Urine: NEGATIVE
Urine Glucose: NEGATIVE
Urobilinogen, UA: 0.2 (ref 0.0–1.0)

## 2011-07-31 LAB — LIPID PANEL: Cholesterol: 127 mg/dL (ref 0–200)

## 2011-07-31 LAB — HEPATIC FUNCTION PANEL
ALT: 25 U/L (ref 0–35)
AST: 19 U/L (ref 0–37)
Albumin: 4.2 g/dL (ref 3.5–5.2)

## 2011-07-31 LAB — MICROALBUMIN / CREATININE URINE RATIO: Creatinine,U: 84.2 mg/dL

## 2011-07-31 NOTE — Telephone Encounter (Signed)
Pt needs labs entered for upcoming CPX appointment.

## 2011-08-04 ENCOUNTER — Encounter: Payer: Self-pay | Admitting: Endocrinology

## 2011-08-04 ENCOUNTER — Ambulatory Visit (INDEPENDENT_AMBULATORY_CARE_PROVIDER_SITE_OTHER): Payer: Medicare Other | Admitting: Endocrinology

## 2011-08-04 ENCOUNTER — Ambulatory Visit: Payer: Medicare Other | Admitting: Endocrinology

## 2011-08-04 VITALS — BP 100/62 | HR 89 | Temp 98.6°F | Wt 159.4 lb

## 2011-08-04 DIAGNOSIS — Z0000 Encounter for general adult medical examination without abnormal findings: Secondary | ICD-10-CM

## 2011-08-04 DIAGNOSIS — M810 Age-related osteoporosis without current pathological fracture: Secondary | ICD-10-CM

## 2011-08-04 DIAGNOSIS — I10 Essential (primary) hypertension: Secondary | ICD-10-CM

## 2011-08-04 MED ORDER — VALSARTAN-HYDROCHLOROTHIAZIDE 160-12.5 MG PO TABS: 1.0000 | ORAL_TABLET | Freq: Every day | ORAL | Status: DC

## 2011-08-04 NOTE — Patient Instructions (Addendum)
Reduce diovan-hct to 160/12.5, 1 daily Try stopping zetia, on a trial basis. Please come back for a follow-up appointment in 6 months please consider these measures for your health:  minimize alcohol.  do not use tobacco products.  have a colonoscopy at least every 10 years from age 66.  Women should have an annual mammogram from age 80.  keep firearms safely stored.  always use seat belts.  have working smoke alarms in your home.  see an eye doctor and dentist regularly.  never drive under the influence of alcohol or drugs (including prescription drugs).   please let me know what your wishes would be, if artificial life support measures should become necessary.  it is critically important to prevent falling down (keep floor areas well-lit, dry, and free of loose objects.  If you have a cane, walker, or wheelchair, you should use it, even for short trips around the house.  Also, try not to rush).   (update: we discussed code status.  pt requests full code, but would not want to be started or maintained on artificial life-support measures if there was not a reasonable chance of recovery)

## 2011-08-04 NOTE — Progress Notes (Signed)
  Subjective:    Patient ID: Amy Escobar, female    DOB: 1946/05/31, 66 y.o.   MRN: [id number]  HPI here for regular wellness examination.  He's feeling pretty well in general, and says chronic med probs are stable, except as noted below.  Gyn is [facility] [employer].     Review of Systems  Constitutional: Negative for fever.  HENT: Negative for hearing loss.   Eyes: Negative for visual disturbance.  Respiratory: Negative for cough.   Cardiovascular: Negative for chest pain.  Gastrointestinal: Negative for anal bleeding.  Genitourinary: Negative for hematuria.  Musculoskeletal: Negative for gait problem.  Skin: Negative for rash.  Neurological: Negative for numbness.  Hematological: Bruises/bleeds easily.  Psychiatric/Behavioral: Negative for dysphoric mood.      Objective:   Physical Exam VS: see vs page GEN: no distress HEAD: head: no deformity eyes: no periorbital swelling, no proptosis external nose and ears are normal mouth: no lesion seen NECK: supple, thyroid is not enlarged.   CHEST WALL: no deformity BREASTS: sees gyn ABD: abdomen is soft, nontender.  no hepatosplenomegaly.  not distended.  no hernia.  GENITALIA/RECTAL: sees gyn.   MUSCULOSKELETAL: muscle bulk and strength are grossly normal.  no obvious joint swelling.  gait is normal and steady EXTEMITIES: no deformity.  no ulcer on the feet.  feet are of normal color and temp.  no edema.  There is bilateral onychomycosis.  The left great toenail is surgically absent PULSES: dorsalis pedis intact bilat.  no carotid bruit NEURO:  cn 2-12 grossly intact.   readily moves all 4's.  sensation is intact to touch on the feet SKIN:  Normal texture and temperature.  No rash or suspicious lesion is visible.  There is a small ecchymosis at blood-drawing site (left antecubital) NODES:  None palpable at the neck PSYCH: alert, oriented x3.  Does not appear anxious nor depressed.        Assessment & Plan:  Wellness  visit today, with problems stable, except as noted.    SEPARATE EVALUATION FOLLOWS--EACH PROBLEM HERE IS NEW, NOT RESPONDING TO TREATMENT, OR POSES SIGNIFICANT RISK TO THE PATIENT'S HEALTH: HISTORY OF THE PRESENT ILLNESS: Pt states few mos of slight dizziness sensation in the head, but no assoc loc PAST MEDICAL HISTORY reviewed and up to date today REVIEW OF SYSTEMS: She has lost weight, due to her efforts.  Denies LOC PHYSICAL EXAMINATION: VITAL SIGNS:  See vs page GENERAL: no distress HEART:  Regular rate and rhythm without murmurs noted. Normal S1,S2.   LUNGS:  Clear to auscultation LAB/XRAY RESULTS: Lab Results  Component Value Date   CHOL 127 07/31/2011   HDL 55.50 07/31/2011   LDLCALC 64 07/31/2011   LDLDIRECT 165.6 06/19/2009   TRIG 36.0 07/31/2011   CHOLHDL 2 07/31/2011  i reviewed electrocardiogram IMPRESSION: Weight-loss, uncertain etiology HTN, overcontrolled due to weight-loss Dizziness, prob due to overcontrol of the HTN Dyslipidemia.  She can go without the zetia now PLAN: See instruction page

## 2011-08-11 ENCOUNTER — Ambulatory Visit (INDEPENDENT_AMBULATORY_CARE_PROVIDER_SITE_OTHER)
Admission: RE | Admit: 2011-08-11 | Discharge: 2011-08-11 | Disposition: A | Discharge status: Home / Self Care | Payer: Medicare Other | Source: Ambulatory Visit

## 2011-08-11 DIAGNOSIS — M810 Age-related osteoporosis without current pathological fracture: Secondary | ICD-10-CM

## 2011-08-19 ENCOUNTER — Encounter: Payer: Self-pay | Admitting: Endocrinology

## 2011-09-01 ENCOUNTER — Ambulatory Visit: Payer: Medicare Other | Admitting: Internal Medicine

## 2011-09-18 ENCOUNTER — Other Ambulatory Visit: Payer: Self-pay | Admitting: Endocrinology

## 2011-12-04 ENCOUNTER — Other Ambulatory Visit: Payer: Self-pay | Admitting: Endocrinology

## 2011-12-11 ENCOUNTER — Telehealth: Payer: Self-pay | Admitting: Endocrinology

## 2011-12-11 NOTE — Telephone Encounter (Signed)
Ok to have a new meter. Same as on med list, or any brand she wants. Your ins wont accept testing more than 1/day

## 2011-12-11 NOTE — Telephone Encounter (Signed)
Onetouch meter placed upfront for pt. Pt informed.

## 2011-12-11 NOTE — Telephone Encounter (Signed)
Caller: Rebecca/Patient; PCP: Earl Lagos Calling regarding  NEEDING REPLACEMENT ACCURATest Meter -the Same Kind That Will Match the Test Strips She Has. She last tested a few days ago but usually tests 3 x daily and uses exercise and diet to control blood sugar. Information left on Ashley's voice mail to call Rion back at  CB#: [phone];

## 2011-12-26 ENCOUNTER — Other Ambulatory Visit: Payer: Self-pay | Admitting: Endocrinology

## 2012-02-02 ENCOUNTER — Other Ambulatory Visit (INDEPENDENT_AMBULATORY_CARE_PROVIDER_SITE_OTHER): Payer: Medicare Other

## 2012-02-02 ENCOUNTER — Ambulatory Visit (INDEPENDENT_AMBULATORY_CARE_PROVIDER_SITE_OTHER)
Admission: RE | Admit: 2012-02-02 | Discharge: 2012-02-02 | Disposition: A | Discharge status: Home / Self Care | Payer: Medicare Other | Source: Ambulatory Visit | Attending: Endocrinology | Admitting: Endocrinology

## 2012-02-02 ENCOUNTER — Ambulatory Visit: Payer: Medicare Other | Admitting: Endocrinology

## 2012-02-02 ENCOUNTER — Encounter: Payer: Self-pay | Admitting: Endocrinology

## 2012-02-02 ENCOUNTER — Ambulatory Visit (INDEPENDENT_AMBULATORY_CARE_PROVIDER_SITE_OTHER): Payer: Medicare Other | Admitting: Endocrinology

## 2012-02-02 VITALS — BP 112/82 | HR 80 | Temp 98.4°F | Ht 64.0 in | Wt 163.0 lb

## 2012-02-02 DIAGNOSIS — M25552 Pain in left hip: Secondary | ICD-10-CM

## 2012-02-02 DIAGNOSIS — M25559 Pain in unspecified hip: Secondary | ICD-10-CM

## 2012-02-02 DIAGNOSIS — E119 Type 2 diabetes mellitus without complications: Secondary | ICD-10-CM

## 2012-02-02 DIAGNOSIS — Z0279 Encounter for issue of other medical certificate: Secondary | ICD-10-CM

## 2012-02-02 MED ORDER — CEFUROXIME AXETIL 250 MG PO TABS: 250.0000 mg | ORAL_TABLET | Freq: Two times a day (BID) | ORAL | Status: AC

## 2012-02-02 NOTE — Progress Notes (Signed)
  Subjective:    Patient ID: Amy Escobar, female    DOB: 27-Feb-1946, 66 y.o.   MRN: [id number]  HPI Pt states few days of slight itching at the ears, and assoc nasal congestion.  Pt brings a wellness form from [facility] that she would like [place] to fill out.  i advised pt that she could do it on her own, and i would give her the information, but she requests for [place] to do it.   Past Medical History  Diagnosis Date  . Hyperlipidemia   . DM2 (diabetes mellitus, type 2)   . Hypertension   . Allergic rhinitis   . GERD (gastroesophageal reflux disease)   . Osteoporosis   . Uterine fibroid   . Alkaline phosphatase deficiency     w/u Ne  . Dyslipidemia   . Thyroid nodule     small  . Gout   . Anxiety     Past Surgical History  Procedure Date  . Removed tumors from foot nerves 04/1999  . Stress cardiolite 02/12/2006  . Echocardiogram (other) 01/16/2002    History   Social History  . Marital Status: Widowed    Spouse Name: N/A    Number of Children: N/A  . Years of Education: N/A   Occupational History  . Not on file.   Social History Main Topics  . Smoking status: Former Games developer  . Smokeless tobacco: Not on file  . Alcohol Use: Yes  . Drug Use:   . Sexually Active:    Other Topics Concern  . Not on file   Social History Narrative   Widowed 2010.     Current Outpatient Prescriptions on File Prior to Visit  Medication Sig Dispense Refill  . gabapentin (NEURONTIN) 300 MG capsule take 1 capsule by mouth at bedtime  30 capsule  5  . metFORMIN (GLUCOPHAGE-XR) 500 MG 24 hr tablet TAKE 1 TABLET BY MOUTH DAILY WITH BREAKFAST  30 tablet  5  . naproxen (NAPROSYN) 500 MG tablet Take 500 mg by mouth 2 (two) times daily as needed.        . ONE TOUCH ULTRA TEST test strip use as directed by prescriber three times a day  100 each  11  . ONETOUCH DELICA LANCETS MISC 1 each by Does not apply route 3 (three) times daily.  100 each  11  . simvastatin (ZOCOR) 80 MG tablet TAKE ONE TABLET  BY MOUTH DAILY AT BEDTIME  90 tablet  2  . valsartan-hydrochlorothiazide (DIOVAN-HCT) 160-12.5 MG per tablet Take 1 tablet by mouth daily.  30 tablet  11    Allergies  Allergen Reactions  . Olmesartan Medoxomil     REACTION: headache    No family history on file.  BP 112/82  Pulse 80  Temp 98.4 F (36.9 C) (Oral)  Ht 5\' 4"  (1.626 m)  Wt 163 lb (73.936 kg)  BMI 27.98 kg/m2  SpO2 99%    Review of Systems Denies fever, but she has bilat earache.  She also has left hip pain.    Objective:   Physical Exam VITAL SIGNS:  See vs page GENERAL: no distress head: no deformity eyes: no periorbital swelling, no proptosis external nose and ears are normal mouth: no lesion seen Both tm's are very red   Lab Results  Component Value Date   HGBA1C 5.7 02/02/2012  (i reviewed x-ray results)    Assessment & Plan:  URI, new DM.  well-controlled Hip pain, new, uncertain etiology

## 2012-02-02 NOTE — Patient Instructions (Addendum)
i have sent a prescription to your pharmacy, for an antibiotic pill.  Loratadine-d (non-prescription) will help your congestion.  A blood test and an x-ray are being requested for you today.  You will receive a letter with results. Please come back for a follow-up appointment in 6 months.

## 2012-02-03 ENCOUNTER — Telehealth: Payer: Self-pay | Admitting: *Deleted

## 2012-02-03 NOTE — Telephone Encounter (Signed)
Called pt to inform of lab and xray results, pt informed (letter also mailed to pt).  

## 2012-02-04 ENCOUNTER — Ambulatory Visit: Payer: Medicare Other | Admitting: Endocrinology

## 2012-02-09 ENCOUNTER — Telehealth: Payer: Self-pay | Admitting: *Deleted

## 2012-02-09 DIAGNOSIS — Z0000 Encounter for general adult medical examination without abnormal findings: Secondary | ICD-10-CM

## 2012-02-09 DIAGNOSIS — E119 Type 2 diabetes mellitus without complications: Secondary | ICD-10-CM

## 2012-02-09 NOTE — Telephone Encounter (Signed)
Message copied by Deatra James on Tue Feb 09, 2012  3:44 PM ------      Message from: Etheleen Sia      Created: Tue Feb 02, 2012  8:45 AM      Regarding: LABS       Camiyah SCHEDULED HER PHYSICAL FOR FEB 12.  SHE HAS [employer] MEDICARE COMPLETE.  SHE SAYS THEY WILL PAY FOR THE LABS PRIOR.

## 2012-02-09 NOTE — Telephone Encounter (Signed)
Received staff msg pt made cpx labs for Feb. Needing labs entered... 02/09/12@3 :44pm/LMB

## 2012-03-18 ENCOUNTER — Other Ambulatory Visit: Payer: Self-pay

## 2012-03-18 MED ORDER — SIMVASTATIN 80 MG PO TABS: 80.0000 mg | ORAL_TABLET | Freq: Every day | ORAL | Status: DC

## 2012-03-22 ENCOUNTER — Telehealth: Payer: Self-pay | Admitting: Endocrinology

## 2012-03-22 NOTE — Telephone Encounter (Signed)
Pt advised via personal VM that Rx was not denied - it was approved 09/20 for #30 x 2.

## 2012-03-22 NOTE — Telephone Encounter (Signed)
Caller: [name]/Patient; Phone: [phone]; Reason for Call: Patient calling to get a refill on her Zocor.  States the pharmacy told her the refill was denied and she wanted to know why it had been denied.  Please call her back.  Thanks

## 2012-06-25 ENCOUNTER — Other Ambulatory Visit: Payer: Self-pay | Admitting: Endocrinology

## 2012-06-27 ENCOUNTER — Other Ambulatory Visit: Payer: Self-pay | Admitting: Endocrinology

## 2012-07-21 ENCOUNTER — Other Ambulatory Visit: Payer: Self-pay | Admitting: Endocrinology

## 2012-07-21 DIAGNOSIS — Z1231 Encounter for screening mammogram for malignant neoplasm of breast: Secondary | ICD-10-CM

## 2012-07-22 ENCOUNTER — Ambulatory Visit
Admission: RE | Admit: 2012-07-22 | Discharge: 2012-07-22 | Disposition: A | Discharge status: Home / Self Care | Payer: Medicare Other | Source: Ambulatory Visit | Attending: Endocrinology | Admitting: Endocrinology

## 2012-07-22 DIAGNOSIS — Z1231 Encounter for screening mammogram for malignant neoplasm of breast: Secondary | ICD-10-CM

## 2012-07-26 ENCOUNTER — Other Ambulatory Visit: Payer: Self-pay | Admitting: Endocrinology

## 2012-08-05 ENCOUNTER — Ambulatory Visit: Payer: Medicare Other

## 2012-08-05 DIAGNOSIS — Z0000 Encounter for general adult medical examination without abnormal findings: Secondary | ICD-10-CM

## 2012-08-05 DIAGNOSIS — E119 Type 2 diabetes mellitus without complications: Secondary | ICD-10-CM

## 2012-08-05 LAB — BASIC METABOLIC PANEL
CO2: 30 mEq/L (ref 19–32)
Chloride: 103 mEq/L (ref 96–112)
Creatinine, Ser: 0.8 mg/dL (ref 0.4–1.2)
Potassium: 4 mEq/L (ref 3.5–5.1)
Sodium: 140 mEq/L (ref 135–145)

## 2012-08-05 LAB — URINALYSIS, ROUTINE W REFLEX MICROSCOPIC
Hgb urine dipstick: NEGATIVE
Nitrite: NEGATIVE
Specific Gravity, Urine: 1.01 (ref 1.000–1.030)
Total Protein, Urine: NEGATIVE

## 2012-08-05 LAB — LIPID PANEL
HDL: 37.3 mg/dL — ABNORMAL LOW (ref >39.00)
LDL Cholesterol: 93 mg/dL (ref 0–99)
Total CHOL/HDL Ratio: 4
Triglycerides: 102 mg/dL (ref 0.0–149.0)
VLDL: 20.4 mg/dL (ref 0.0–40.0)

## 2012-08-05 LAB — MICROALBUMIN / CREATININE URINE RATIO
Creatinine,U: 79 mg/dL
Microalb Creat Ratio: 0.5 mg/g (ref 0.0–30.0)

## 2012-08-05 LAB — CBC WITH DIFFERENTIAL/PLATELET
Basophils Absolute: 0 10*3/uL (ref 0.0–0.1)
Eosinophils Relative: 8.8 % — ABNORMAL HIGH (ref 0.0–5.0)
Lymphocytes Relative: 28.7 % (ref 12.0–46.0)
Lymphs Abs: 2.1 10*3/uL (ref 0.7–4.0)
Monocytes Relative: 10 % (ref 3.0–12.0)
Neutrophils Relative %: 51.9 % (ref 43.0–77.0)
Platelets: 199 10*3/uL (ref 150.0–400.0)
RDW: 14.7 % — ABNORMAL HIGH (ref 11.5–14.6)
WBC: 7.4 10*3/uL (ref 4.5–10.5)

## 2012-08-05 LAB — TSH: TSH: 0.76 u[IU]/mL (ref 0.35–5.50)

## 2012-08-05 LAB — HEPATIC FUNCTION PANEL
Albumin: 3.8 g/dL (ref 3.5–5.2)
Alkaline Phosphatase: 101 U/L (ref 39–117)
Total Bilirubin: 0.5 mg/dL (ref 0.3–1.2)

## 2012-08-05 LAB — HEMOGLOBIN A1C: Hgb A1c MFr Bld: 5.6 % (ref 4.6–6.5)

## 2012-08-10 ENCOUNTER — Ambulatory Visit (INDEPENDENT_AMBULATORY_CARE_PROVIDER_SITE_OTHER): Payer: Medicare Other | Admitting: Endocrinology

## 2012-08-10 ENCOUNTER — Encounter: Payer: Self-pay | Admitting: Endocrinology

## 2012-08-10 VITALS — BP 122/74 | HR 74 | Wt 163.0 lb

## 2012-08-10 DIAGNOSIS — E1059 Type 1 diabetes mellitus with other circulatory complications: Secondary | ICD-10-CM

## 2012-08-10 DIAGNOSIS — R1310 Dysphagia, unspecified: Secondary | ICD-10-CM | POA: Insufficient documentation

## 2012-08-10 DIAGNOSIS — E119 Type 2 diabetes mellitus without complications: Secondary | ICD-10-CM

## 2012-08-10 MED ORDER — ESOMEPRAZOLE MAGNESIUM 40 MG PO CPDR: 40.0000 mg | DELAYED_RELEASE_CAPSULE | Freq: Every day | ORAL | Status: DC

## 2012-08-10 MED ORDER — NAPROXEN 500 MG PO TABS: 500.0000 mg | ORAL_TABLET | Freq: Two times a day (BID) | ORAL | Status: DC

## 2012-08-10 MED ORDER — ZOSTER VACCINE LIVE 19400 UNT/0.65ML ~~LOC~~ SOLR: 0.6500 mL | Freq: Once | SUBCUTANEOUS | Status: DC

## 2012-08-10 MED ORDER — CEFUROXIME AXETIL 250 MG PO TABS: 250.0000 mg | ORAL_TABLET | Freq: Two times a day (BID) | ORAL | Status: AC

## 2012-08-10 NOTE — Patient Instructions (Addendum)
Refer back to dr stark.  you will receive a phone call, about a day and time for an appointment. i have sent 3 prescriptions to your pharmacy: for nexium, zostavax, and an antibiotic Loratadine-d (non-prescription) will help your congestion.. please consider these measures for your health:  minimize alcohol.  do not use tobacco products.  have a colonoscopy at least every 10 years from age 67.  Women should have an annual mammogram from age 67.  keep firearms safely stored.  always use seat belts.  have working smoke alarms in your home.  see an eye doctor and dentist regularly.  never drive under the influence of alcohol or drugs (including prescription drugs).   Please return in 1 year.

## 2012-08-10 NOTE — Progress Notes (Signed)
Subjective:    Patient ID: Amy Escobar, female    DOB: 17-Sep-1945, 67 y.o.   MRN: [id number]  HPI here for regular wellness examination.  He's feeling pretty well in general, and says chronic med probs are stable, except as noted below Past Medical History  Diagnosis Date  . Hyperlipidemia   . DM2 (diabetes mellitus, type 2)   . Hypertension   . Allergic rhinitis   . GERD (gastroesophageal reflux disease)   . Osteoporosis   . Uterine fibroid   . Alkaline phosphatase deficiency     w/u Ne  . Dyslipidemia   . Thyroid nodule     small  . Gout   . Anxiety     Past Surgical History  Procedure Laterality Date  . 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 Stress cardiolite  02/12/2006  . Echocardiogram (other)  01/16/2002    History   Social History  . Marital Status: Widowed    Spouse Name: N/A    Number of Children: N/A  . Years of Education: N/A   Occupational History  . Not on file.   Social History Main Topics  . Smoking status: Former Games developer  . Smokeless tobacco: Not on file  . Alcohol Use: Yes  . Drug Use:   . Sexually Active:    Other Topics Concern  . Not on file   Social History Narrative   Widowed 2010.     Current Outpatient Prescriptions on File Prior to Visit  Medication Sig Dispense Refill  . gabapentin (NEURONTIN) 300 MG capsule take 1 capsule by mouth at bedtime  30 capsule  5  . metFORMIN (GLUCOPHAGE-XR) 500 MG 24 hr tablet TAKE 1 TABLET BY MOUTH DAILY WITH BREAKFAST  30 tablet  2  . ONE TOUCH ULTRA TEST test strip use as directed by prescriber three times a day  100 each  11  . ONETOUCH DELICA LANCETS MISC use as directed by prescriber three times a day  100 each  11  . simvastatin (ZOCOR) 80 MG tablet TAKE 1 TABLET (80 MG TOTAL) BY MOUTH AT BEDTIME.  30 tablet  2  . valsartan-hydrochlorothiazide (DIOVAN-HCT) 160-12.5 MG per tablet Take 1 tablet by mouth daily.  30 tablet  11   No current facility-administered medications on file  prior to visit.    Allergies  Allergen Reactions  . Olmesartan Medoxomil     REACTION: headache    No family history on file.  BP 122/74  Pulse 74  Wt 163 lb (73.936 kg)  BMI 27.97 kg/m2  SpO2 96%     Review of Systems  Constitutional: Negative for fever and unexpected weight change.  HENT: Negative for hearing loss.   Eyes: Negative for visual disturbance.  Respiratory: Negative for shortness of breath.   Cardiovascular: Negative for chest pain.  Endocrine: Negative for polyuria.  Genitourinary: Negative for hematuria.  Musculoskeletal: Positive for arthralgias.  Skin: Negative for rash.  Allergic/Immunologic: Positive for environmental allergies.  Neurological: Negative for syncope and numbness.  Hematological: Bruises/bleeds easily.  Psychiatric/Behavioral: Negative for dysphoric mood.      Objective:   Physical Exam VS: see vs page GEN: no distress NECK: supple, thyroid is not enlarged CHEST WALL: no deformity LUNGS:  Clear to auscultation. BREASTS:  sees gyn CV: reg rate and rhythm, no murmur ABD: abdomen is soft, nontender.  no hepatosplenomegaly.  not distended.  no hernia GENITALIARECTAL: sees gyn.   MUSCULOSKELETAL: muscle bulk and strength are grossly normal.  no obvious joint swelling.  gait is normal and steady EXTEMITIES: no deformity.  no ulcer on the feet.  feet are of normal color and temp.  no edema PULSES: dorsalis pedis intact bilat.  no carotid bruit NEURO:  cn 2-12 grossly intact.   readily moves all 4's.  sensation is intact to touch on the feet SKIN:  Normal texture and temperature.  No rash or suspicious lesion is visible.   NODES:  None palpable at the neck. PSYCH: alert, oriented x3.  Does not appear anxious nor depressed.        Assessment & Plan:  Wellness visit today, with problems stable, except as noted. we discussed code status.  pt requests full code, but would not want to be started or maintained on artificial life-support  measures if there was not a reasonable chance of recovery   SEPARATE EVALUATION FOLLOWS--EACH PROBLEM HERE IS NEW, NOT RESPONDING TO TREATMENT, OR POSES SIGNIFICANT RISK TO THE PATIENT'S HEALTH: HISTORY OF THE PRESENT ILLNESS: Pt states 2 mos of intermittent moderate solid dysphagia, in the context of eating.  She has assoc regurgitation.   PAST MEDICAL HISTORY reviewed and up to date today REVIEW OF SYSTEMS: She has a few weeks of sore throat and prod cough.   PHYSICAL EXAMINATION: VITAL SIGNS:  See vs page GENERAL: no distress head: no deformity eyes: no periorbital swelling, no proptosis external nose and ears are normal mouth: no lesion seen Both eac's and tm's are normal LAB/XRAY RESULTS: Lab Results  Component Value Date   WBC 7.4 08/05/2012   HGB 12.5 08/05/2012   HCT 38.6 08/05/2012   PLT 199.0 08/05/2012   GLUCOSE 87 08/05/2012   CHOL 151 08/05/2012   TRIG 102.0 08/05/2012   HDL 37.30* 08/05/2012   LDLDIRECT 165.6 06/19/2009   LDLCALC 93 08/05/2012   ALT 26 08/05/2012   AST 21 08/05/2012   NA 140 08/05/2012   K 4.0 08/05/2012   CL 103 08/05/2012   CREATININE 0.8 08/05/2012   BUN 11 08/05/2012   CO2 30 08/05/2012   TSH 0.76 08/05/2012   HGBA1C 5.6 08/05/2012   MICROALBUR 0.4 08/05/2012  IMPRESSION: Dysphagia, new URI, new PLAN: See instruction page

## 2012-08-19 NOTE — Addendum Note (Signed)
Addended by: Sharlyne Pacas on: 08/19/2012 08:45 AM   Modules accepted: Orders

## 2012-08-24 ENCOUNTER — Encounter: Payer: Self-pay | Admitting: Gastroenterology

## 2012-08-24 ENCOUNTER — Ambulatory Visit (INDEPENDENT_AMBULATORY_CARE_PROVIDER_SITE_OTHER): Payer: Medicare Other | Admitting: Gastroenterology

## 2012-08-24 VITALS — BP 120/68 | HR 76 | Ht 64.0 in | Wt 168.8 lb

## 2012-08-24 NOTE — Progress Notes (Addendum)
History of Present Illness: This is a 67 year old female who has a past history of reflux. For the past several years she has not been on any medications and symptoms have been controlled with diet. She has had frequent post prandial heartburn and regurgitation. She started daily omeprazole which did not completely control her symptoms and she was recently changed to Nexium which appears to be more effective. She previously underwent colonoscopy in January 2007 showing only internal hemorrhoids. Denies weight loss, abdominal pain, constipation, diarrhea, change in stool caliber, melena, hematochezia, nausea, vomiting, dysphagia, chest pain.  Review of Systems: Pertinent positive and negative review of systems were noted in the above HPI section. All other review of systems were otherwise negative.  Current Medications, Allergies, Past Medical History, Past Surgical History, Family History and Social History were reviewed in [employer] record.  Physical Exam: General: Well developed , well nourished, no acute distress Head: Normocephalic and atraumatic Eyes:  sclerae anicteric, EOMI Ears: Normal auditory acuity Mouth: No deformity or lesions Neck: Supple, no masses or thyromegaly Lungs: Clear throughout to auscultation Heart: Regular rate and rhythm; no murmurs, rubs or bruits Abdomen: Soft, non tender and non distended. No masses, hepatosplenomegaly or hernias noted. Normal Bowel sounds Musculoskeletal: Symmetrical with no gross deformities  Skin: No lesions on visible extremities Pulses:  Normal pulses noted Extremities: No clubbing, cyanosis, edema or deformities noted Neurological: Alert oriented x 4, grossly nonfocal Cervical Nodes:  No significant cervical adenopathy Inguinal Nodes: No significant inguinal adenopathy Psychological:  Alert and cooperative. Normal mood and affect  Assessment and Recommendations:  1. GERD. Rule out esophagitis and Barrett's.  Continue Nexium 40 mg daily and standard antireflux measures. Schedule upper endoscopy. The risks, benefits, and alternatives to endoscopy with possible biopsy and possible dilation were discussed with the patient and they consent to proceed.   2. Colorectal cancer screening, average risk. 10 year screening colonoscopy recommended which will be due in January 2017.

## 2012-08-24 NOTE — Patient Instructions (Addendum)
You have been scheduled for an endoscopy with propofol. Please follow written instructions given to you at your visit today. If you use inhalers (even only as needed) or a CPAP machine, please bring them with you on the day of your procedure.  Patient advised to avoid spicy, acidic, citrus, chocolate, mints, fruit and fruit juices.  Limit the intake of caffeine, alcohol and Soda.  Don't exercise too soon after eating.  Don't lie down within 3-4 hours of eating.  Elevate the head of your bed.  Thank you for choosing me and [facility].  Venita Lick. Pleas Koch., MD., Clement[facility] Graham

## 2012-08-28 ENCOUNTER — Other Ambulatory Visit: Payer: Self-pay | Admitting: Endocrinology

## 2012-09-06 ENCOUNTER — Encounter: Payer: Self-pay | Admitting: Gastroenterology

## 2012-09-06 ENCOUNTER — Other Ambulatory Visit: Payer: Self-pay | Admitting: Gastroenterology

## 2012-09-06 ENCOUNTER — Ambulatory Visit (AMBULATORY_SURGERY_CENTER): Payer: Medicare Other | Admitting: Gastroenterology

## 2012-09-06 VITALS — BP 147/84 | HR 67 | Temp 96.9°F | Resp 17 | Ht 64.0 in | Wt 168.0 lb

## 2012-09-06 MED ORDER — SODIUM CHLORIDE 0.9 % IV SOLN: 500.0000 mL | INTRAVENOUS | Status: DC

## 2012-09-06 NOTE — Patient Instructions (Addendum)
Discharge instructions given with verbal understanding. Resume previous medications. YOU HAD AN ENDOSCOPIC PROCEDURE TODAY AT THE [facility]: Refer to the procedure report that was given to you for any specific questions about what was found during the examination.  If the procedure report does not answer your questions, please call your gastroenterologist to clarify.  If you requested that your care partner not be given the details of your procedure findings, then the procedure report has been included in a sealed envelope for you to review at your convenience later.  YOU SHOULD EXPECT: Some feelings of bloating in the abdomen. Passage of more gas than usual.  Walking can help get rid of the air that was put into your GI tract during the procedure and reduce the bloating. If you had a lower endoscopy (such as a colonoscopy or flexible sigmoidoscopy) you may notice spotting of blood in your stool or on the toilet paper. If you underwent a bowel prep for your procedure, then you may not have a normal bowel movement for a few days.  DIET: Your first meal following the procedure should be a light meal and then it is ok to progress to your normal diet.  A half-sandwich or bowl of soup is an example of a good first meal.  Heavy or fried foods are harder to digest and may make you feel nauseous or bloated.  Likewise meals heavy in dairy and vegetables can cause extra gas to form and this can also increase the bloating.  Drink plenty of fluids but you should avoid alcoholic beverages for 24 hours.  ACTIVITY: Your care partner should take you home directly after the procedure.  You should plan to take it easy, moving slowly for the rest of the day.  You can resume normal activity the day after the procedure however you should NOT DRIVE or use heavy machinery for 24 hours (because of the sedation medicines used during the test).    SYMPTOMS TO REPORT IMMEDIATELY: A gastroenterologist can be reached  at any hour.  During normal business hours, 8:30 AM to 5:00 PM Monday through Friday, call [phone].  After hours and on weekends, please call the GI answering service at [phone] who will take a message and have the physician on call contact you.   Following upper endoscopy (EGD)  Vomiting of blood or coffee ground material  New chest pain or pain under the shoulder blades  Painful or persistently difficult swallowing  New shortness of breath  Fever of 100F or higher  Black, tarry-looking stools  FOLLOW UP: If any biopsies were taken you will be contacted by phone or by letter within the next 1-3 weeks.  Call your gastroenterologist if you have not heard about the biopsies in 3 weeks.  Our staff will call the home number listed on your records the next business day following your procedure to check on you and address any questions or concerns that you may have at that time regarding the information given to you following your procedure. This is a courtesy call and so if there is no answer at the home number and we have not heard from you through the emergency physician on call, we will assume that you have returned to your regular daily activities without incident.  SIGNATURES/CONFIDENTIALITY: You and/or your care partner have signed paperwork which will be entered into your electronic medical record.  These signatures attest to the fact that that the information above on your After Visit Summary   has been reviewed and is understood.  Full responsibility of the confidentiality of this discharge information lies with you and/or your care-partner. 

## 2012-09-06 NOTE — Progress Notes (Signed)
Patient did not experience any of the following events: a burn prior to discharge; a fall within the facility; wrong site/side/patient/procedure/implant event; or a hospital transfer or hospital admission upon discharge from the facility. (G8907) Patient did not have preoperative order for IV antibiotic SSI prophylaxis. (G8918)  

## 2012-09-06 NOTE — Op Note (Signed)
[facility] [address] [place], [address]   ENDOSCOPY PROCEDURE REPORT  PATIENT: Amy, Escobar  MR#: [id number] BIRTHDATE: 02/23/1946 , 67  yrs. old GENDER: Female ENDOSCOPIST: Meryl Dare, MD, [facility] PROCEDURE DATE:  09/06/2012 PROCEDURE:  EGD, diagnostic ASA CLASS:     Class II INDICATIONS:  History of esophageal reflux. MEDICATIONS: MAC sedation, administered by CRNA and propofol (Diprivan) 100mg  IV TOPICAL ANESTHETIC: none DESCRIPTION OF PROCEDURE: After the risks benefits and alternatives of the procedure were thoroughly explained, informed consent was obtained.  The LB-GIF Q180 [id number] endoscope was introduced through the mouth and advanced to the second portion of the duodenum without limitations.  The instrument was slowly withdrawn as the mucosa was fully examined.  ESOPHAGUS: The mucosa of the esophagus appeared normal. STOMACH: The mucosa and folds of the stomach appeared normal. DUODENUM: The duodenal mucosa showed no abnormalities in the bulb and second portion of the duodenum.  Retroflexed views revealed no abnormalities.  The scope was then withdrawn from the patient and the procedure completed.  COMPLICATIONS: There were no complications.  ENDOSCOPIC IMPRESSION: 1.   EGD appeared normal  RECOMMENDATIONS: 1.  Anti-reflux regimen 2.  Continue PPI    eSigned:  Meryl Dare, MD, [facility] 09/06/2012 12:10 PM

## 2012-09-06 NOTE — Progress Notes (Signed)
Report to pacu rn, vss, bbs=clear 

## 2012-09-07 ENCOUNTER — Telehealth: Payer: Self-pay

## 2012-09-07 NOTE — Telephone Encounter (Signed)
  Follow up Call-  Call back number 09/06/2012  Post procedure Call Back phone  # [phone]  Permission to leave phone message Yes     Patient questions:  Do you have a fever, pain , or abdominal swelling? no Pain Score  0 *  Have you tolerated food without any problems? yes  Have you been able to return to your normal activities? yes  Do you have any questions about your discharge instructions: Diet   no Medications  no Follow up visit  no  Do you have questions or concerns about your Care? no  Actions: * If pain score is 4 or above: No action needed, pain <4.

## 2012-09-28 ENCOUNTER — Telehealth: Payer: Self-pay

## 2012-09-28 NOTE — Telephone Encounter (Signed)
Left message on vmail.

## 2012-09-28 NOTE — Telephone Encounter (Signed)
It is non-prescription.  They keep it behind the counter.

## 2012-09-28 NOTE — Telephone Encounter (Signed)
The otc is the same as you can get with a prescription.  please advise ov

## 2012-09-28 NOTE — Telephone Encounter (Signed)
Pt advised she needs an an ov,

## 2012-09-28 NOTE — Telephone Encounter (Signed)
Pt called requesting rx for Allegra D, not on med list?

## 2012-09-28 NOTE — Telephone Encounter (Signed)
Pt states she has tried the [employer] d, and it has not worked, she wants to get a rx

## 2012-10-03 NOTE — Telephone Encounter (Signed)
Called patient, scheduled apt for 4/8

## 2012-10-04 ENCOUNTER — Ambulatory Visit: Payer: Medicare Other | Admitting: Endocrinology

## 2012-10-30 ENCOUNTER — Other Ambulatory Visit: Payer: Self-pay | Admitting: Endocrinology

## 2012-11-25 ENCOUNTER — Encounter: Payer: Self-pay | Admitting: Endocrinology

## 2012-11-25 ENCOUNTER — Ambulatory Visit (INDEPENDENT_AMBULATORY_CARE_PROVIDER_SITE_OTHER): Payer: Medicare Other | Admitting: Endocrinology

## 2012-11-25 VITALS — BP 128/74 | HR 80 | Ht 64.0 in | Wt 166.0 lb

## 2012-11-25 DIAGNOSIS — E119 Type 2 diabetes mellitus without complications: Secondary | ICD-10-CM

## 2012-11-25 MED ORDER — ONETOUCH ULTRA MINI W/DEVICE KIT: 1.0000 | PACK | Freq: Once | Status: DC

## 2012-11-25 NOTE — Progress Notes (Signed)
Subjective:    Patient ID: Amy Escobar, female    DOB: March 28, 1946, 67 y.o.   MRN: [id number]  HPI Pt says she had an episode of cbg of 60 a few days ago.  She says she has had other readings in the 60's.  She had no sxs with this.   Past Medical History  Diagnosis Date  . Hyperlipidemia   . DM2 (diabetes mellitus, type 2)   . Hypertension   . Allergic rhinitis   . GERD (gastroesophageal reflux disease)   . Osteoporosis   . Uterine fibroid   . Alkaline phosphatase deficiency     w/u Ne  . Dyslipidemia   . Thyroid nodule     small  . Gout   . Anxiety   . Hemorrhoids     Past Surgical History  Procedure Laterality Date  . 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 Stress cardiolite  02/12/2006  . Echocardiogram (other)  01/16/2002  . Tubal ligation    . Toenail excision      History   Social History  . Marital Status: Widowed    Spouse Name: N/A    Number of Children: 2  . 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 Smoking status: Former Games developer  . Smokeless tobacco: Never Used  . Alcohol Use: No  . Drug Use: No  . Sexually Active: Not on file   Other Topics Concern  . Not on file   Social History Narrative   Widowed 2010.     Current Outpatient Prescriptions on File Prior to Visit  Medication Sig Dispense Refill  . esomeprazole (NEXIUM) 40 MG capsule Take 1 capsule (40 mg total) by mouth daily before breakfast.  30 capsule  11  . gabapentin (NEURONTIN) 300 MG capsule take 1 capsule by mouth at bedtime  30 capsule  5  . naproxen (NAPROSYN) 500 MG tablet Take 1 tablet (500 mg total) by mouth 2 (two) times daily with a meal.  60 tablet  11  . ONE TOUCH ULTRA TEST test strip use as directed by prescriber three times a day  100 each  11  . ONETOUCH DELICA LANCETS MISC use as directed by prescriber three times a day  100 each  11  . simvastatin (ZOCOR) 80 MG tablet TAKE 1 TABLET (80 MG TOTAL) BY MOUTH AT  BEDTIME.  30 tablet  1  . valsartan-hydrochlorothiazide (DIOVAN-HCT) 160-12.5 MG per tablet TAKE 1 TABLET BY MOUTH DAILY.  30 tablet  10  . zoster vaccine live, PF, (ZOSTAVAX) 40981 UNT/0.65ML injection Inject 19,400 Units into the skin once.  1 each  0   No current facility-administered medications on file prior to visit.    Allergies  Allergen Reactions  . Olmesartan Medoxomil     REACTION: headache    Family History  Problem Relation Age of Onset  . Heart disease Mother   . Heart disease Maternal Grandfather   . Rectal cancer Maternal Grandfather   . Stomach cancer Maternal Grandmother   . Heart disease Father     BP 128/74  Pulse 80  Ht 5\' 4"  (1.626 m)  Wt 166 lb (75.297 kg)  BMI 28.48 kg/m2  SpO2 98%  Review of Systems Denies weight change    Objective:   Physical Exam VITAL SIGNS:  See vs page GENERAL: no distress  Lab Results  Component Value Date   HGBA1C 5.6  08/05/2012      Assessment & Plan:  DM: she is not tolerating metformin, so she should have a trial off medication.

## 2012-11-25 NOTE — Patient Instructions (Addendum)
Please try stopping the metformin.   Please come back to the lab in 3 months to repeat your blood test.   check your blood sugar once a day.  vary the time of day when you check, between before the 3 meals, and at bedtime.  also check if you have symptoms of your blood sugar being too high or too low.  please keep a record of the readings and bring it to your next appointment here.  please call us sooner if your blood sugar goes below 70, or if you have a lot of readings over 200.

## 2012-12-29 ENCOUNTER — Other Ambulatory Visit: Payer: Self-pay | Admitting: Endocrinology

## 2013-01-31 ENCOUNTER — Other Ambulatory Visit: Payer: Self-pay | Admitting: Endocrinology

## 2013-02-27 ENCOUNTER — Telehealth: Payer: Self-pay | Admitting: Endocrinology

## 2013-02-28 ENCOUNTER — Telehealth: Payer: Self-pay | Admitting: Endocrinology

## 2013-02-28 ENCOUNTER — Other Ambulatory Visit: Payer: Self-pay

## 2013-02-28 DIAGNOSIS — N390 Urinary tract infection, site not specified: Secondary | ICD-10-CM

## 2013-02-28 NOTE — Telephone Encounter (Signed)
Pt stated she is going to come in to the lab for a ua specimen and f/u with Dr. Everardo All on Friday

## 2013-03-01 ENCOUNTER — Other Ambulatory Visit (INDEPENDENT_AMBULATORY_CARE_PROVIDER_SITE_OTHER): Payer: Medicare Other

## 2013-03-01 DIAGNOSIS — E1059 Type 1 diabetes mellitus with other circulatory complications: Secondary | ICD-10-CM

## 2013-03-01 DIAGNOSIS — N390 Urinary tract infection, site not specified: Secondary | ICD-10-CM

## 2013-03-01 LAB — URINALYSIS, ROUTINE W REFLEX MICROSCOPIC
Nitrite: NEGATIVE
RBC / HPF: NONE SEEN (ref 0–?)
Total Protein, Urine: NEGATIVE
pH: 6.5 (ref 5.0–8.0)

## 2013-03-03 ENCOUNTER — Ambulatory Visit (INDEPENDENT_AMBULATORY_CARE_PROVIDER_SITE_OTHER): Payer: Medicare Other | Admitting: Endocrinology

## 2013-03-03 ENCOUNTER — Encounter: Payer: Self-pay | Admitting: Endocrinology

## 2013-03-03 VITALS — BP 126/70 | HR 78 | Ht 64.0 in | Wt 170.0 lb

## 2013-03-03 DIAGNOSIS — N390 Urinary tract infection, site not specified: Secondary | ICD-10-CM

## 2013-03-03 NOTE — Patient Instructions (Addendum)
Please call if your hip pain or urine symptoms happen again.

## 2013-03-03 NOTE — Progress Notes (Signed)
Subjective:    Patient ID: Amy Escobar, female    DOB: [birth year], 67 y.o.   MRN: [id number]  HPI Pt states few days of moderate burning at the urethra, and assoc urinary hesitancy.  She was rx'ed bactrim.  She feels better now.   Past Medical History  Diagnosis Date  . Hyperlipidemia   . DM2 (diabetes mellitus, type 2)   . Hypertension   . Allergic rhinitis   . GERD (gastroesophageal reflux disease)   . Osteoporosis   . Uterine fibroid   . Alkaline phosphatase deficiency     w/u Ne  . Dyslipidemia   . Thyroid nodule     small  . Gout   . Anxiety   . Hemorrhoids     Past Surgical History  Procedure Laterality Date  . 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 Stress cardiolite  02/12/2006  . Echocardiogram (other)  01/16/2002  . Tubal ligation    . Toenail excision      History   Social History  . Marital Status: Widowed    Spouse Name: N/A    Number of Children: 2  . 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 Smoking status: Former Games developer  . Smokeless tobacco: Never Used  . Alcohol Use: No  . Drug Use: No  . Sexual Activity: Not on file   Other Topics Concern  . Not on file   Social History Narrative   Widowed 2010.     Current Outpatient Prescriptions on File Prior to Visit  Medication Sig Dispense Refill  . Blood Glucose Monitoring Suppl (ONE TOUCH ULTRA MINI) W/DEVICE KIT 1 Device by Does not apply route once.  1 each  0  . esomeprazole (NEXIUM) 40 MG capsule Take 1 capsule (40 mg total) by mouth daily before breakfast.  30 capsule  11  . gabapentin (NEURONTIN) 300 MG capsule TAKE ONE CAPSULE BY MOUTH DAILY AT BEDTIME  30 capsule  3  . naproxen (NAPROSYN) 500 MG tablet Take 1 tablet (500 mg total) by mouth 2 (two) times daily with a meal.  60 tablet  11  . ONE TOUCH ULTRA TEST test strip USE AS DIRECTED BY PRESCRIBER THREE TIMES A DAY  100 each  4  . ONETOUCH DELICA LANCETS MISC use as  directed by prescriber three times a day  100 each  11  . simvastatin (ZOCOR) 80 MG tablet TAKE 1 TABLET (80 MG TOTAL) BY MOUTH AT BEDTIME.  30 tablet  3  . valsartan-hydrochlorothiazide (DIOVAN-HCT) 160-12.5 MG per tablet TAKE 1 TABLET BY MOUTH DAILY.  30 tablet  10  . zoster vaccine live, PF, (ZOSTAVAX) 40981 UNT/0.65ML injection Inject 19,400 Units into the skin once.  1 each  0   No current facility-administered medications on file prior to visit.    Allergies  Allergen Reactions  . Olmesartan Medoxomil     REACTION: headache   Family History  Problem Relation Age of Onset  . Heart disease Mother   . Heart disease Maternal Grandfather   . Rectal cancer Maternal Grandfather   . Stomach cancer Maternal Grandmother   . Heart disease Father    BP 126/70  Pulse 78  Ht 5\' 4"  (1.626 m)  Wt 170 lb (77.111 kg)  BMI 29.17 kg/m2  SpO2 98%  Review of Systems Denies fever.  She had left hip pain, but this is improved.  Objective:   Physical Exam VITAL SIGNS:  See vs page.   GENERAL: no distress.     Assessment & Plan:  Apparent UTI, clinically better

## 2013-03-07 ENCOUNTER — Encounter: Payer: Self-pay | Admitting: Endocrinology

## 2013-05-03 ENCOUNTER — Telehealth: Payer: Self-pay

## 2013-05-03 NOTE — Telephone Encounter (Signed)
Pt left voicemail stating she is very tired, could she get b-12 injection, [phone]

## 2013-05-03 NOTE — Telephone Encounter (Signed)
Pt advised and states she will try b-12 pills, otc

## 2013-05-03 NOTE — Telephone Encounter (Signed)
Ins does not pay for this injection unless your blood test shows a low b-12. You could buy a b-12 pill, though

## 2013-06-23 ENCOUNTER — Other Ambulatory Visit: Payer: Self-pay | Admitting: Endocrinology

## 2013-06-24 ENCOUNTER — Other Ambulatory Visit: Payer: Self-pay | Admitting: Endocrinology

## 2013-07-03 ENCOUNTER — Telehealth: Payer: Self-pay

## 2013-07-03 NOTE — Telephone Encounter (Signed)
please call patient: Please tell us what part of the body, and why?

## 2013-07-03 NOTE — Telephone Encounter (Signed)
Pt called stating that she needed to have an MRI done before appointment to see you on 08/10/2013.   Please advise, Thanks!

## 2013-07-04 NOTE — Telephone Encounter (Signed)
Insurance won't pay with this information.  i would be happy to see you in the office, as always, to check your symptoms.

## 2013-07-04 NOTE — Telephone Encounter (Signed)
Pt states that she wants to have a full body scan to check for cancer. Pt states that she has been having pain in her legs and back, abnormal bowel movements, and coughing a lot. Pt states that she wants to be precocious and be check.

## 2013-07-04 NOTE — Telephone Encounter (Signed)
Pt informed stated that she would wait till visit in February to discuss this.

## 2013-07-17 ENCOUNTER — Other Ambulatory Visit: Payer: Self-pay

## 2013-07-17 DIAGNOSIS — Z1231 Encounter for screening mammogram for malignant neoplasm of breast: Secondary | ICD-10-CM

## 2013-07-29 ENCOUNTER — Other Ambulatory Visit: Payer: Self-pay | Admitting: Endocrinology

## 2013-08-01 ENCOUNTER — Ambulatory Visit: Payer: Medicare Other

## 2013-08-04 ENCOUNTER — Telehealth: Payer: Self-pay | Admitting: Endocrinology

## 2013-08-04 ENCOUNTER — Encounter: Payer: Self-pay | Admitting: Endocrinology

## 2013-08-04 ENCOUNTER — Ambulatory Visit (INDEPENDENT_AMBULATORY_CARE_PROVIDER_SITE_OTHER): Payer: Medicare Other | Admitting: Endocrinology

## 2013-08-04 VITALS — BP 120/82 | HR 86 | Temp 98.6°F | Ht 64.0 in | Wt 178.0 lb

## 2013-08-04 DIAGNOSIS — Z0000 Encounter for general adult medical examination without abnormal findings: Secondary | ICD-10-CM

## 2013-08-04 DIAGNOSIS — I10 Essential (primary) hypertension: Secondary | ICD-10-CM

## 2013-08-04 DIAGNOSIS — R0600 Dyspnea, unspecified: Secondary | ICD-10-CM

## 2013-08-04 DIAGNOSIS — E119 Type 2 diabetes mellitus without complications: Secondary | ICD-10-CM

## 2013-08-04 DIAGNOSIS — Z79899 Other long term (current) drug therapy: Secondary | ICD-10-CM

## 2013-08-04 DIAGNOSIS — D500 Iron deficiency anemia secondary to blood loss (chronic): Secondary | ICD-10-CM

## 2013-08-04 DIAGNOSIS — R0609 Other forms of dyspnea: Secondary | ICD-10-CM

## 2013-08-04 DIAGNOSIS — R748 Abnormal levels of other serum enzymes: Secondary | ICD-10-CM

## 2013-08-04 DIAGNOSIS — E7800 Pure hypercholesterolemia, unspecified: Secondary | ICD-10-CM

## 2013-08-04 DIAGNOSIS — E041 Nontoxic single thyroid nodule: Secondary | ICD-10-CM

## 2013-08-04 DIAGNOSIS — M109 Gout, unspecified: Secondary | ICD-10-CM

## 2013-08-04 DIAGNOSIS — R0989 Other specified symptoms and signs involving the circulatory and respiratory systems: Secondary | ICD-10-CM

## 2013-08-04 DIAGNOSIS — K769 Liver disease, unspecified: Secondary | ICD-10-CM

## 2013-08-04 LAB — LIPID PANEL
Cholesterol: 173 mg/dL (ref 0–200)
HDL: 44 mg/dL
LDL Cholesterol: 102 mg/dL — ABNORMAL HIGH (ref 0–99)
Total CHOL/HDL Ratio: 4
Triglycerides: 137 mg/dL (ref 0.0–149.0)
VLDL: 27.4 mg/dL (ref 0.0–40.0)

## 2013-08-04 LAB — BASIC METABOLIC PANEL WITH GFR
BUN: 11 mg/dL (ref 6–23)
CO2: 29 meq/L (ref 19–32)
Calcium: 9.3 mg/dL (ref 8.4–10.5)
Chloride: 103 meq/L (ref 96–112)
Creatinine, Ser: 0.8 mg/dL (ref 0.4–1.2)
GFR: 86.71 mL/min
Glucose, Bld: 88 mg/dL (ref 70–99)
Potassium: 3.6 meq/L (ref 3.5–5.1)
Sodium: 141 meq/L (ref 135–145)

## 2013-08-04 LAB — URINALYSIS, ROUTINE W REFLEX MICROSCOPIC
Bilirubin Urine: NEGATIVE
Hgb urine dipstick: NEGATIVE
KETONES UR: NEGATIVE
Nitrite: NEGATIVE
PH: 6.5 (ref 5.0–8.0)
Specific Gravity, Urine: 1.01 (ref 1.000–1.030)
TOTAL PROTEIN, URINE-UPE24: NEGATIVE
URINE GLUCOSE: NEGATIVE
Urobilinogen, UA: 0.2 (ref 0.0–1.0)

## 2013-08-04 LAB — HEMOGLOBIN A1C: Hgb A1c MFr Bld: 6.5 % (ref 4.6–6.5)

## 2013-08-04 LAB — CBC WITH DIFFERENTIAL/PLATELET
Basophils Absolute: 0 K/uL (ref 0.0–0.1)
Basophils Relative: 0.4 % (ref 0.0–3.0)
Eosinophils Absolute: 0.5 K/uL (ref 0.0–0.7)
Eosinophils Relative: 6.3 % — ABNORMAL HIGH (ref 0.0–5.0)
HCT: 42.2 % (ref 36.0–46.0)
Hemoglobin: 13.5 g/dL (ref 12.0–15.0)
Lymphocytes Relative: 42.9 % (ref 12.0–46.0)
Lymphs Abs: 3.4 K/uL (ref 0.7–4.0)
MCHC: 32 g/dL (ref 30.0–36.0)
MCV: 86.9 fl (ref 78.0–100.0)
Monocytes Absolute: 0.8 K/uL (ref 0.1–1.0)
Monocytes Relative: 9.6 % (ref 3.0–12.0)
Neutro Abs: 3.3 K/uL (ref 1.4–7.7)
Neutrophils Relative %: 40.8 % — ABNORMAL LOW (ref 43.0–77.0)
Platelets: 198 K/uL (ref 150.0–400.0)
RBC: 4.85 Mil/uL (ref 3.87–5.11)
RDW: 13.5 % (ref 11.5–14.6)
WBC: 8 K/uL (ref 4.5–10.5)

## 2013-08-04 LAB — HEPATIC FUNCTION PANEL
ALT: 39 U/L — AB (ref 0–35)
AST: 39 U/L — ABNORMAL HIGH (ref 0–37)
Albumin: 4.2 g/dL (ref 3.5–5.2)
Alkaline Phosphatase: 116 U/L (ref 39–117)
BILIRUBIN DIRECT: 0 mg/dL (ref 0.0–0.3)
BILIRUBIN TOTAL: 0.7 mg/dL (ref 0.3–1.2)
Total Protein: 7.7 g/dL (ref 6.0–8.3)

## 2013-08-04 LAB — TSH: TSH: 1.07 u[IU]/mL (ref 0.35–5.50)

## 2013-08-04 LAB — URIC ACID: Uric Acid, Serum: 6.8 mg/dL (ref 2.4–7.0)

## 2013-08-04 MED ORDER — FLUTICASONE-SALMETEROL 100-50 MCG/DOSE IN AEPB: 1.0000 | INHALATION_SPRAY | Freq: Two times a day (BID) | RESPIRATORY_TRACT | Status: DC

## 2013-08-04 NOTE — Telephone Encounter (Signed)
Pt not due till 06/2015. Pt advised.

## 2013-08-04 NOTE — Progress Notes (Signed)
 Subjective:    Patient ID: Amy Escobar, female    DOB: 07/15/1945, 68 y.o.   MRN: [id number]  HPI Pt is here for regular wellness examination, and is feeling pretty well in general, and says chronic med probs are stable, except as noted below Past Medical History  Diagnosis Date  . Hyperlipidemia   . DM2 (diabetes mellitus, type 2)   . Hypertension   . Allergic rhinitis   . GERD (gastroesophageal reflux disease)   . Osteoporosis   . Uterine fibroid   . Alkaline phosphatase deficiency     w/u Ne  . Dyslipidemia   . Thyroid nodule     small  . Gout   . Anxiety   . Hemorrhoids     Past Surgical History  Procedure Laterality Date  . 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 Stress cardiolite  02/12/2006  . Echocardiogram (other)  01/16/2002  . Tubal ligation    . Toenail excision      History   Social History  . Marital Status: Widowed    Spouse Name: N/A    Number of Children: 2  . 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 Smoking status: Former Smoker  . Smokeless tobacco: Never Used  . Alcohol Use: No  . Drug Use: No  . Sexual Activity: Not on file   Other Topics Concern  . Not on file   Social History Narrative   Widowed 2010.     Current Outpatient Prescriptions on File Prior to Visit  Medication Sig Dispense Refill  . Blood Glucose Monitoring Suppl (ONE TOUCH ULTRA MINI) W/DEVICE KIT 1 Device by Does not apply route once.  1 each  0  . gabapentin (NEURONTIN) 300 MG capsule TAKE ONE CAPSULE BY MOUTH DAILY AT BEDTIME  30 capsule  3  . metFORMIN (GLUCOPHAGE-XR) 500 MG 24 hr tablet TAKE 1 TABLET BY MOUTH DAILY WITH BREAKFAST  30 tablet  2  . naproxen (NAPROSYN) 500 MG tablet Take 1 tablet (500 mg total) by mouth 2 (two) times daily with a meal.  60 tablet  11  . NEXIUM 40 MG capsule TAKE 1 CAPSULE (40 MG TOTAL) BY MOUTH DAILY BEFORE BREAKFAST.  30 capsule  10  . ONE TOUCH ULTRA TEST test strip  TEST BLOOD SUGAR THREE(3) TIMES DAILY  100 each  3  . ONETOUCH DELICA LANCETS MISC use as directed by prescriber three times a day  100 each  11  . simvastatin (ZOCOR) 80 MG tablet TAKE 1 TABLET BY MOUTH EVERY NIGHT AT BEDTIME  30 tablet  2  . valsartan-hydrochlorothiazide (DIOVAN-HCT) 160-12.5 MG per tablet TAKE 1 TABLET BY MOUTH DAILY  30 tablet  9  . zoster vaccine live, PF, (ZOSTAVAX) 19400 UNT/0.65ML injection Inject 19,400 Units into the skin once.  1 each  0   No current facility-administered medications on file prior to visit.    Allergies  Allergen Reactions  . Olmesartan Medoxomil     REACTION: headache    Family History  Problem Relation Age of Onset  . Heart disease Mother   . Heart disease Maternal Grandfather   . Rectal cancer Maternal Grandfather   . Stomach cancer Maternal Grandmother   . Heart disease Father     BP 120/82  Pulse 86  Temp(Src) 98.6 F (37 C) (Oral)  Ht 5' 4" (1.626 m)  Wt 178 lb (80.74 kg)    BMI 30.54 kg/m2  SpO2 97%  Review of Systems  Constitutional: Negative for fever.  HENT: Negative for hearing loss.   Eyes: Negative for visual disturbance.  Respiratory: Negative for wheezing.   Cardiovascular: Negative for chest pain.  Gastrointestinal: Negative for blood in stool.  Endocrine: Negative for cold intolerance.  Genitourinary: Negative for hematuria.  Musculoskeletal: Positive for back pain.  Skin: Negative for rash.  Allergic/Immunologic: Negative for environmental allergies.  Neurological: Negative for syncope.  Hematological: Negative for adenopathy.  Psychiatric/Behavioral: Negative for dysphoric mood.       Objective:   Physical Exam VS: see vs page GEN: no distress HEAD: head: no deformity eyes: no periorbital swelling, no proptosis external nose and ears are normal mouth: no lesion seen NECK: supple, thyroid is not enlarged CHEST WALL: no deformity BREASTS:  sees gyn CV: reg rate and rhythm, no murmur ABD: abdomen  is soft, nontender.  no hepatosplenomegaly.  not distended.  no hernia GENITALIA/RECTAL: sees gyn MUSCULOSKELETAL: muscle bulk and strength are grossly normal.  no obvious joint swelling.  gait is normal and steady EXTEMITIES: no deformity.  no ulcer on the feet.  feet are of normal color and temp.  no edema PULSES: dorsalis pedis intact bilat.  no carotid bruit NEURO:  cn 2-12 grossly intact.   readily moves all 4's.  sensation is intact to touch on the feet SKIN:  Normal texture and temperature.  No rash or suspicious lesion is visible.   NODES:  None palpable at the neck PSYCH: alert, well-oriented.  Does not appear anxious nor depressed.         Assessment & Plan:  Wellness visit today, with problems stable, except as noted.     SEPARATE EVALUATION FOLLOWS--EACH PROBLEM HERE IS NEW, NOT RESPONDING TO TREATMENT, OR POSES SIGNIFICANT RISK TO THE PATIENT'S HEALTH: HISTORY OF THE PRESENT ILLNESS: Pt states few mos of slight doe in the chest, but no assoc cough. PAST MEDICAL HISTORY reviewed and up to date today REVIEW OF SYSTEMS: She has gain ed weight PHYSICAL EXAMINATION: VITAL SIGNS:  See vs page GENERAL: no distress LUNGS:  Clear to auscultation LAB/XRAY RESULTS: (i reviewed spirometry results) IMPRESSION: SOB, prob due to COPD PLAN: See instruction page

## 2013-08-04 NOTE — Patient Instructions (Addendum)
blood tests are being requested for you today.  We'll contact you with results. i have sent a prescription to your pharmacy, for an inhaler. Refer to a lung specialist.  you will receive a phone call, about a day and time for an appointment. please consider these measures for your health:  minimize alcohol.  do not use tobacco products.  have a colonoscopy at least every 10 years from age 68.  Women should have an annual mammogram from age 46.  keep firearms safely stored.  always use seat belts.  have working smoke alarms in your home.  see an eye doctor and dentist regularly.  never drive under the influence of alcohol or drugs (including prescription drugs).   Please come back for a follow-up appointment in 6 months.

## 2013-08-04 NOTE — Telephone Encounter (Signed)
Please call GI When is pt due for colonoscopy? Please let patient know

## 2013-08-07 LAB — MICROALBUMIN / CREATININE URINE RATIO
Creatinine,U: 89.2 mg/dL
Microalb Creat Ratio: 0.7 mg/g (ref 0.0–30.0)
Microalb, Ur: 0.6 mg/dL (ref 0.0–1.9)

## 2013-08-08 ENCOUNTER — Ambulatory Visit (INDEPENDENT_AMBULATORY_CARE_PROVIDER_SITE_OTHER): Payer: Medicare Other | Admitting: Internal Medicine

## 2013-08-08 ENCOUNTER — Ambulatory Visit (INDEPENDENT_AMBULATORY_CARE_PROVIDER_SITE_OTHER)
Admission: RE | Admit: 2013-08-08 | Discharge: 2013-08-08 | Disposition: A | Discharge status: Home / Self Care | Payer: Medicare Other | Source: Ambulatory Visit | Attending: Internal Medicine | Admitting: Internal Medicine

## 2013-08-08 ENCOUNTER — Other Ambulatory Visit: Payer: Medicare Other

## 2013-08-08 ENCOUNTER — Encounter: Payer: Self-pay | Admitting: Internal Medicine

## 2013-08-08 VITALS — BP 134/78 | HR 55 | Temp 98.0°F | Ht 64.5 in | Wt 181.6 lb

## 2013-08-08 DIAGNOSIS — R0600 Dyspnea, unspecified: Secondary | ICD-10-CM

## 2013-08-08 DIAGNOSIS — R0989 Other specified symptoms and signs involving the circulatory and respiratory systems: Secondary | ICD-10-CM

## 2013-08-08 DIAGNOSIS — R0609 Other forms of dyspnea: Secondary | ICD-10-CM

## 2013-08-08 NOTE — Progress Notes (Signed)
   Subjective:    Patient ID: Amy Escobar, female    DOB: 20-Feb-1946  MRN: [id number]  HPI  81 yobf quit smoking quit around 1980s no resp troubles until around Sept 2014 started noting doe x stairs and progressed to where short on flat surface x 3 blocks referred 08/08/2013 to pulmonary clinic.   08/08/2013 1st [facility] office visit/ [name] cc indoleent onset progressive doe x 3-4 month assoc with about 15 lb of wt gain and some arthritic complaints in her knees.  Can still do elipitical fine but not one flight of steps s stopping at top > tried on  advair by Dr Arnoldo Lenis for abn spirometry > could not tol, did not help    No obvious day to day or daytime variabilty or assoc chronic cough or cp or chest tightness, subjective wheeze overt sinus or hb symptoms. No unusual exp hx or h/o childhood pna/ asthma or knowledge of premature birth.  Sleeping ok without nocturnal  or early am exacerbation  of respiratory  c/o's or need for noct saba. Also denies any obvious fluctuation of symptoms with weather or environmental changes or other aggravating or alleviating factors except as outlined above   Current Medications, Allergies, Complete Past Medical History, Past Surgical History, Family History, and Social History were reviewed in [employer] record.         Review of Systems  Constitutional: Negative for fever and unexpected weight change.  HENT: Negative for congestion, dental problem, ear pain, nosebleeds, postnasal drip, rhinorrhea, sinus pressure, sneezing, sore throat and trouble swallowing.   Eyes: Negative for redness and itching.  Respiratory: Positive for shortness of breath. Negative for cough, chest tightness and wheezing.   Cardiovascular: Negative for palpitations and leg swelling.  Gastrointestinal: Negative for nausea and vomiting.  Genitourinary: Negative for dysuria.  Musculoskeletal: Negative for joint swelling.  Skin: Negative for rash.   Neurological: Negative for headaches.  Hematological: Does not bruise/bleed easily.  Psychiatric/Behavioral: Negative for dysphoric mood. The patient is not nervous/anxious.        Objective:   Physical Exam  Wt Readings from Last 3 Encounters:  08/08/13 181 lb 9.6 oz (82.373 kg)  08/04/13 178 lb (80.74 kg)  03/03/13 170 lb (77.111 kg)     HEENT: nl dentition, turbinates, and orophanx. Nl external ear canals without cough reflex   NECK :  without JVD/Nodes/TM/ nl carotid upstrokes bilaterally   LUNGS: no acc muscle use, clear to A and P bilaterally without cough on insp or exp maneuvers   CV:  RRR  no s3 or murmur or increase in P2, no edema   ABD:  soft and nontender with nl excursion in the supine position. No bruits or organomegaly, bowel sounds nl  MS:  warm without deformities, calf tenderness, cyanosis or clubbing  SKIN: warm and dry without lesions    NEURO:  alert, approp, no deficits    CXR  08/08/2013 :  No acute cardiopulmonary disease.  Labs 08/08/13  bnp ordered D dimer nl Labs 08/04/13 nl hgb, HC03, TSH        Assessment & Plan:

## 2013-08-08 NOTE — Patient Instructions (Signed)
Please remember to go to the lab and x-ray department downstairs for your tests - we will call you with the results when they are available.  Continue nexium Take 30-60 min before first meal of the day   GERD (REFLUX)  is an extremely common cause of respiratory symptoms, many times with no significant heartburn at all.    It can be treated with medication, but also with lifestyle changes including avoidance of late meals, excessive alcohol, smoking cessation, and avoid fatty foods, chocolate, peppermint, colas, red wine, and acidic juices such as orange juice.  NO MINT OR MENTHOL PRODUCTS SO NO COUGH DROPS  USE SUGARLESS CANDY INSTEAD (jolley ranchers or Stover's)  NO OIL BASED VITAMINS - use powdered substitutes.       Please schedule a follow up office visit in 4 weeks, sooner if needed with pfts

## 2013-08-09 ENCOUNTER — Ambulatory Visit
Admission: RE | Admit: 2013-08-09 | Discharge: 2013-08-09 | Disposition: A | Discharge status: Home / Self Care | Payer: Medicare Other | Source: Ambulatory Visit | Attending: Endocrinology | Admitting: Endocrinology

## 2013-08-09 ENCOUNTER — Other Ambulatory Visit: Payer: Medicare Other

## 2013-08-09 ENCOUNTER — Ambulatory Visit
Admission: RE | Admit: 2013-08-09 | Discharge: 2013-08-09 | Disposition: A | Discharge status: Home / Self Care | Payer: Medicare Other | Source: Ambulatory Visit

## 2013-08-09 DIAGNOSIS — Z1231 Encounter for screening mammogram for malignant neoplasm of breast: Secondary | ICD-10-CM

## 2013-08-09 DIAGNOSIS — E041 Nontoxic single thyroid nodule: Secondary | ICD-10-CM

## 2013-08-09 LAB — D-DIMER, QUANTITATIVE: D-Dimer, Quant: 0.44 ug/mL-FEU (ref 0.00–0.48)

## 2013-08-09 NOTE — Progress Notes (Signed)
Quick Note:  Spoke with pt and notified of results per Dr. Wert. Pt verbalized understanding and denied any questions.  ______ 

## 2013-08-09 NOTE — Assessment & Plan Note (Signed)
Symptoms are markedly disproportionate to objective findings and not clear this is a lung problem but pt does appear to have difficult airway management issues. DDX of  difficult airways managment all start with A and  include Adherence, Ace Inhibitors, Acid Reflux, Active Sinus Disease, Alpha 1 Antitripsin deficiency, Anxiety masquerading as Airways dz,  ABPA,  allergy(esp in young), Aspiration (esp in elderly), Adverse effects of DPI,  Active smokers, plus two Bs  = Bronchiectasis and Beta blocker use..and one C= CHF  Adherence is always the initial "prime suspect" and is a multilayered concern that requires a "trust but verify" approach in every patient - starting with knowing how to use medications, especially inhalers, correctly, keeping up with refills and understanding the fundamental difference between maintenance and prns vs those medications only taken for a very short course and then stopped and not refilled.  - appears to be taking meds as listed  ? Acid (or non-acid) GERD > always difficult to exclude as up to 75% of pts in some series report no assoc GI/ Heartburn symptoms> rec continue max (24h)  acid suppression and diet restrictions/ reviewed and instructions given in writing.   ? Anxiety/ wt gain > dx of exclusion  ? CHF > d dimer ordered, ekg reviewed and does suggest some LAE  Will have her return for full pfts p rx for a month with reconditioning/ gerd diet

## 2013-08-10 ENCOUNTER — Encounter: Payer: Medicare Other | Admitting: Endocrinology

## 2013-08-27 ENCOUNTER — Other Ambulatory Visit: Payer: Self-pay | Admitting: Endocrinology

## 2013-09-06 ENCOUNTER — Ambulatory Visit (INDEPENDENT_AMBULATORY_CARE_PROVIDER_SITE_OTHER): Payer: Medicare Other | Admitting: Nurse Practitioner

## 2013-09-06 ENCOUNTER — Encounter: Payer: Self-pay | Admitting: Nurse Practitioner

## 2013-09-06 VITALS — BP 136/95 | HR 90

## 2013-09-06 DIAGNOSIS — I4949 Other premature depolarization: Secondary | ICD-10-CM

## 2013-09-06 DIAGNOSIS — R0609 Other forms of dyspnea: Secondary | ICD-10-CM

## 2013-09-06 DIAGNOSIS — R0989 Other specified symptoms and signs involving the circulatory and respiratory systems: Secondary | ICD-10-CM

## 2013-09-06 DIAGNOSIS — R0600 Dyspnea, unspecified: Secondary | ICD-10-CM

## 2013-09-06 DIAGNOSIS — I493 Ventricular premature depolarization: Secondary | ICD-10-CM

## 2013-09-06 NOTE — Progress Notes (Signed)
Exercise Treadmill Test  Pre-Exercise Testing Evaluation Rhythm: normal sinus  Rate: 86 bpm     Test  Exercise Tolerance Test Ordering MD: Ena Dawley, MD  Interpreting MD: Truitt Merle, NP  Unique Test No: 1  Treadmill:  1  Indication for ETT: exertional dyspnea  Contraindication to ETT: No   Stress Modality: exercise - treadmill  Cardiac Imaging Performed: non   Protocol: standard Bruce - maximal  Max BP:  161/77  Max MPHR (bpm):  152 85% MPR (bpm):  129  MPHR obtained (bpm):  130 % MPHR obtained:  86%  Reached 85% MPHR (min:sec):  2:20 Total Exercise Time (min-sec):  2:41  Workload in METS:  4.6 Borg Scale: 15  Reason ETT Terminated:  patient's desire to stop    ST Segment Analysis At Rest: normal ST segments - no evidence of significant ST depression With Exercise: no evidence of significant ST depression  Other Information Arrhythmia:  Yes Angina during ETT:  absent (0) but patient became short of breath.  Quality of ETT:  indeterminate  ETT Interpretation:  Indeterminate - further testing is warranted.   Comments: Patient presents today for routine GXT. Has had DOE - referred here for GXT and also referred to pulmonary. States she is doing better. Trying to lose weight. For PFTs later this month. Today the patient exercised on the standard Bruce protocol for a total of 2:41 minutes.  Poor exercise tolerance.  Adequate blood pressure response.  Clinically negative for chest pain. Test was stopped due to dyspnea; PVCs and over a minute run of bigeminy PVCs..  EKG negative for ischemia. No significant arrhythmia noted.   Recommendations: Echocardiogram to rule out structural disease Lexiscan myoview  24 Holter Cardiology consult with Dr. Meda Coffee  Patient is agreeable to this plan and will call if any problems develop in the interim.   Burtis Junes, RN, [place] [address] [place], [place]  [phone]

## 2013-09-06 NOTE — Patient Instructions (Addendum)
We will arrange for an echocardiogram  We will arrange for a stress test (lexiscan)  We will arrange for a Holter   We will arrange for one of the cardiologists here to see you for further discussion - preferably Dr. Meda Coffee  Call the [place] office at [phone] if you have any questions, problems or concerns.

## 2013-09-11 ENCOUNTER — Other Ambulatory Visit: Payer: Self-pay | Admitting: Internal Medicine

## 2013-09-11 DIAGNOSIS — R0609 Other forms of dyspnea: Secondary | ICD-10-CM

## 2013-09-11 DIAGNOSIS — R0989 Other specified symptoms and signs involving the circulatory and respiratory systems: Principal | ICD-10-CM

## 2013-09-12 ENCOUNTER — Ambulatory Visit (INDEPENDENT_AMBULATORY_CARE_PROVIDER_SITE_OTHER): Payer: Medicare Other | Admitting: Internal Medicine

## 2013-09-12 ENCOUNTER — Encounter: Payer: Self-pay | Admitting: Internal Medicine

## 2013-09-12 VITALS — BP 108/78 | HR 94 | Temp 98.4°F | Ht 64.0 in | Wt 178.0 lb

## 2013-09-12 DIAGNOSIS — R0609 Other forms of dyspnea: Secondary | ICD-10-CM

## 2013-09-12 DIAGNOSIS — R0989 Other specified symptoms and signs involving the circulatory and respiratory systems: Principal | ICD-10-CM

## 2013-09-12 DIAGNOSIS — R0600 Dyspnea, unspecified: Secondary | ICD-10-CM

## 2013-09-12 LAB — PULMONARY FUNCTION TEST
DL/VA % pred: 94 %
DL/VA: 4.53 ml/min/mmHg/L
DLCO unc % pred: 70 %
DLCO unc: 17.08 ml/min/mmHg
FEF 25-75 PRE: 1.94 L/s
FEF 25-75 Post: 1.91 L/sec
FEF2575-%CHANGE-POST: -1 %
FEF2575-%Pred-Post: 108 %
FEF2575-%Pred-Pre: 110 %
FEV1-%CHANGE-POST: 0 %
FEV1-%Pred-Post: 106 %
FEV1-%Pred-Pre: 106 %
FEV1-Post: 2.01 L
FEV1-Pre: 2 L
FEV1FVC-%Change-Post: 4 %
FEV1FVC-%Pred-Pre: 101 %
FEV6-%Change-Post: -3 %
FEV6-%Pred-Post: 103 %
FEV6-%Pred-Pre: 107 %
FEV6-POST: 2.42 L
FEV6-PRE: 2.5 L
FEV6FVC-%CHANGE-POST: 1 %
FEV6FVC-%PRED-PRE: 102 %
FEV6FVC-%Pred-Post: 104 %
FVC-%Change-Post: -4 %
FVC-%PRED-PRE: 103 %
FVC-%Pred-Post: 99 %
FVC-POST: 2.42 L
FVC-Pre: 2.53 L
POST FEV1/FVC RATIO: 83 %
POST FEV6/FVC RATIO: 100 %
Pre FEV1/FVC ratio: 79 %
Pre FEV6/FVC Ratio: 99 %
RV % PRED: 76 %
RV: 1.64 L
TLC % pred: 81 %
TLC: 4.12 L

## 2013-09-12 MED ORDER — GABAPENTIN 300 MG PO CAPS: ORAL_CAPSULE | ORAL | Status: DC

## 2013-09-12 NOTE — Progress Notes (Signed)
PFT done today. 

## 2013-09-12 NOTE — Progress Notes (Signed)
Subjective:    Patient ID: Amy Escobar, female    DOB: 1946/05/08  MRN: [id number]    Brief patient profile:  74 yobf quit smoking quit around 1980s no resp troubles until around Sept 2014 started noting doe x stairs and progressed to where short on flat surface x 3 blocks referred 08/08/2013 to pulmonary clinic with completely nl pfts documented 09/12/2013   History of Present Illness  08/08/2013 1st [facility] office visit/ Karima Carrell cc indolent onset progressive doe x 3-4 month assoc with about 15 lb of wt gain and some arthritic complaints in her knees.  Can still do elipitical fine but not one flight of steps s stopping at top > tried on  advair by Dr Arnoldo Lenis for abn spirometry > could not tol, did not help rec Continue nexium Take 30-60 min before first meal of the day  GERD diet   09/12/2013 f/u ov/Ferrin Liebig re:  Chief Complaint  Patient presents with  . Followup with PFT    Pt states her breathing is much improved. She c/o increased cough with clear sputum for the past  2 days.   in retrospect for sev years has had urge to clear her throat just in daytime, with min actual sputum production and not disturbing sleep and not aware of any increased congestion in am.  Not limited by breathing from desired activities    No obvious day to day or daytime variabilty or assoc   cp or chest tightness, subjective wheeze overt sinus or hb symptoms. No unusual exp hx or h/o childhood pna/ asthma or knowledge of premature birth.  Sleeping ok without nocturnal  or early am exacerbation  of respiratory  c/o's or need for noct saba. Also denies any obvious fluctuation of symptoms with weather or environmental changes or other aggravating or alleviating factors except as outlined above   Current Medications, Allergies, Complete Past Medical History, Past Surgical History, Family History, and Social History were reviewed in [employer] record.  ROS  The following are not  active complaints unless bolded sore throat, dysphagia, dental problems, itching, sneezing,  nasal congestion or excess/ purulent secretions, ear ache,   fever, chills, sweats, unintended wt loss, pleuritic or exertional cp, hemoptysis,  orthopnea pnd or leg swelling, presyncope, palpitations, heartburn, abdominal pain, anorexia, nausea, vomiting, diarrhea  or change in bowel or urinary habits, change in stools or urine, dysuria,hematuria,  rash, arthralgias, visual complaints, headache, numbness weakness or ataxia or problems with walking or coordination,  change in mood/affect or memory.                         Objective:   Physical Exam  09/12/2013      178  Wt Readings from Last 3 Encounters:  08/08/13 181 lb 9.6 oz (82.373 kg)  08/04/13 178 lb (80.74 kg)  03/03/13 170 lb (77.111 kg)     amb bf with freq throat clearing  HEENT: nl dentition, turbinates, and orophanx which is pristine. Nl external ear canals without cough reflex   NECK :  without JVD/Nodes/TM/ nl carotid upstrokes bilaterally   LUNGS: no acc muscle use, clear to A and P bilaterally without cough on insp or exp maneuvers   CV:  RRR  no s3 or murmur or increase in P2, no edema   ABD:  soft and nontender with nl excursion in the supine position. No bruits or organomegaly, bowel sounds nl  MS:  warm without  deformities, calf tenderness, cyanosis or clubbing  SKIN: warm and dry without lesions         CXR  08/08/2013 :  No acute cardiopulmonary disease.  Labs 08/08/13   D dimer nl Labs 08/04/13 nl hgb, HC03, TSH        Assessment & Plan:

## 2013-09-12 NOTE — Patient Instructions (Addendum)
Increase neurontin to 300 mg  three times a day to see if helps your cough  If still have drainage a recommend a trial of zyrtec 10 mg daily and possible an allergy evaluation   Pulmonary follow up is as needed

## 2013-09-12 NOTE — Assessment & Plan Note (Addendum)
-   spirometry 08/04/13 truncated in a non-physiologic pattern  - 08/08/2013  Walked RA x 3 laps @ 185 ft each stopped due to end of study no desat  - PFT's 09/12/2013 wnl   Lung function studies and all symptoms except urge to clear thorat on nexim one daily strongly suggestive of  Classic Upper airway cough syndrome, so named because it's frequently impossible to sort out how much is  CR/sinusitis with freq throat clearing (which can be related to primary GERD)   vs  causing  secondary (" extra esophageal")  GERD from wide swings in gastric pressure that occur with throat clearing, often  promoting self use of mint and menthol lozenges that reduce the lower esophageal sphincter tone and exacerbate the problem further in a cyclical fashion.   These are the same pts (now being labeled as having "irritable larynx syndrome" by some cough centers) who not infrequently have a history of having failed to tolerate ace inhibitors,  dry powder inhalers or biphosphonates or report having atypical reflux symptoms that don't respond to standard doses of PPI , and are easily confused as having aecopd or asthma flares by even experienced allergists/ pulmonologists.  For now therefore continue ppi, add zyrtec prn and try increase neurontin to 300 tid with f/u allergy or ent prn persistent "throat drainage"   Pulmonary f/u is prn

## 2013-09-14 ENCOUNTER — Encounter (INDEPENDENT_AMBULATORY_CARE_PROVIDER_SITE_OTHER): Payer: Medicare Other

## 2013-09-14 ENCOUNTER — Encounter: Payer: Self-pay | Admitting: *Deleted

## 2013-09-14 DIAGNOSIS — I4949 Other premature depolarization: Secondary | ICD-10-CM

## 2013-09-14 NOTE — Progress Notes (Signed)
Patient ID: Amy Escobar, female   DOB: 25-Feb-1946, 68 y.o.   MRN: [id number] E-Cardio 24 hour holter monitor applied to patient.

## 2013-09-20 ENCOUNTER — Ambulatory Visit (HOSPITAL_BASED_OUTPATIENT_CLINIC_OR_DEPARTMENT_OTHER): Payer: Medicare Other | Admitting: Cardiology

## 2013-09-20 ENCOUNTER — Ambulatory Visit (HOSPITAL_COMMUNITY): Payer: Medicare Other | Attending: Nurse Practitioner | Admitting: Radiology

## 2013-09-20 VITALS — BP 127/85 | HR 73 | Ht 64.0 in | Wt 178.0 lb

## 2013-09-20 DIAGNOSIS — I4949 Other premature depolarization: Secondary | ICD-10-CM | POA: Insufficient documentation

## 2013-09-20 DIAGNOSIS — R0600 Dyspnea, unspecified: Secondary | ICD-10-CM

## 2013-09-20 DIAGNOSIS — R0989 Other specified symptoms and signs involving the circulatory and respiratory systems: Secondary | ICD-10-CM

## 2013-09-20 DIAGNOSIS — I493 Ventricular premature depolarization: Secondary | ICD-10-CM

## 2013-09-20 DIAGNOSIS — R079 Chest pain, unspecified: Secondary | ICD-10-CM

## 2013-09-20 DIAGNOSIS — R0602 Shortness of breath: Secondary | ICD-10-CM

## 2013-09-20 DIAGNOSIS — R0609 Other forms of dyspnea: Secondary | ICD-10-CM

## 2013-09-20 MED ORDER — TECHNETIUM TC 99M SESTAMIBI GENERIC - CARDIOLITE
10.0000 | Freq: Once | INTRAVENOUS | Status: AC | PRN
  Administered 2013-09-20: 10 via INTRAVENOUS

## 2013-09-20 MED ORDER — TECHNETIUM TC 99M SESTAMIBI GENERIC - CARDIOLITE
30.0000 | Freq: Once | INTRAVENOUS | Status: AC | PRN
  Administered 2013-09-20: 30 via INTRAVENOUS

## 2013-09-20 MED ORDER — REGADENOSON 0.4 MG/5ML IV SOLN
0.4000 mg | Freq: Once | INTRAVENOUS | Status: AC
  Administered 2013-09-20: 0.4 mg via INTRAVENOUS

## 2013-09-20 NOTE — Progress Notes (Signed)
[name] [address] [place], [place] [address] [phone]    Cardiology Nuclear Med Study  Amy Escobar is a 68 y.o. female     MRN : [id number]     DOB: 06-Aug-1945  Procedure Date: 09/20/2013  Nuclear Med Background Indication for Stress Test:  Evaluation for Ischemia and Abnormal GXT History:  No known CAD, ETT 3/15 (indeterminate) Cardiac Risk Factors: Family History - CAD, History of Smoking, Hypertension, Lipids and NIDDM  Symptoms:  Chest Pain, DOE and SOB   Nuclear Pre-Procedure Caffeine/Decaff Intake:  None NPO After: 7:00pm   Lungs:  clear O2 Sat: 92% on room air. IV 0.9% NS with Angio Cath:  22g  IV Site: R Hand  IV Started by:  Crissie Figures, RN  Chest Size (in):  38 Cup Size: C  Height: 5\' 4"  (1.626 m)  Weight:  178 lb (80.74 kg)  BMI:  Body mass index is 30.54 kg/(m^2). Tech Comments:  N/A    Nuclear Med Study 1 or 2 day study: 1 day  Stress Test Type:  Lexiscan  Reading MD: N/A  Order Authorizing Provider:  Ena Dawley, MD  Resting Radionuclide: Technetium 24m Sestamibi  Resting Radionuclide Dose: 11.0 mCi   Stress Radionuclide:  Technetium 16m Sestamibi  Stress Radionuclide Dose: 33.0 mCi           Stress Protocol Rest HR: 73 Stress HR: 123  Rest BP: 127/85 Stress BP: 125/79  Exercise Time (min): n/a METS: n/a           Dose of Adenosine (mg):  n/a Dose of Lexiscan: 0.4 mg  Dose of Atropine (mg): n/a Dose of Dobutamine: n/a mcg/kg/min (at max HR)  Stress Test Technologist: Glade Lloyd, BS-ES  Nuclear Technologist:  Charlton Amor, CNMT     Rest Procedure:  Myocardial perfusion imaging was performed at rest 45 minutes following the intravenous administration of Technetium 7m Sestamibi. Rest ECG: NSR - Normal EKG  Stress Procedure:  The patient received IV Lexiscan 0.4 mg over 15-seconds with concurrent low level exercise and then Technetium 64m Sestamibi was injected at 30-seconds while the patient  continued walking one more minute.  Quantitative spect images were obtained after a 45-minute delay.  During the infusion of Lexiscan, the patient complained of feeling fatigued, stomach cramps and a headache.  These began to resolve in recovery.  Stress ECG: There are scattered PVCs.  QPS Raw Data Images:  Normal; no motion artifact; normal heart/lung ratio. Stress Images:  Small fixed apical defect, mild inferolateral bowel artifact Rest Images:  Small fixed apical defect, mild inferolateral bowel artifact Subtraction (SDS):  No evidence of ischemia. Transient Ischemic Dilatation (Normal <1.22):  0.99 Lung/Heart Ratio (Normal <0.45):  0.32  Quantitative Gated Spect Images QGS EDV:  105 ml QGS ESV:  49 ml  Impression Exercise Capacity:  Lexiscan with low level exercise. BP Response:  Normal blood pressure response. Clinical Symptoms:  No symptoms. ECG Impression:  There are scattered PVCs. Comparison with Prior Nuclear Study: No previous nuclear study performed  Overall Impression:  Low risk stress nuclear study with apical thinning defect and small area of inferolateral bowel artifact.  LV Ejection Fraction: 53%.  LV Wall Motion:  NL LV Function; NL Wall Motion  Pixie Casino, MD, [facility] Board Certified in Nuclear Cardiology Attending Cardiologist [place]

## 2013-09-20 NOTE — Progress Notes (Signed)
Echo performed. 

## 2013-09-29 ENCOUNTER — Other Ambulatory Visit: Payer: Self-pay | Admitting: Endocrinology

## 2013-09-30 ENCOUNTER — Other Ambulatory Visit: Payer: Self-pay | Admitting: Endocrinology

## 2013-10-06 ENCOUNTER — Ambulatory Visit (INDEPENDENT_AMBULATORY_CARE_PROVIDER_SITE_OTHER): Payer: Medicare Other | Admitting: Cardiology

## 2013-10-06 ENCOUNTER — Encounter: Payer: Self-pay | Admitting: Cardiology

## 2013-10-06 VITALS — BP 122/67 | HR 87 | Ht 64.0 in | Wt 176.0 lb

## 2013-10-06 DIAGNOSIS — I2789 Other specified pulmonary heart diseases: Secondary | ICD-10-CM

## 2013-10-06 DIAGNOSIS — I272 Pulmonary hypertension, unspecified: Secondary | ICD-10-CM

## 2013-10-06 MED ORDER — AMLODIPINE BESYLATE 2.5 MG PO TABS: 2.5000 mg | ORAL_TABLET | Freq: Every day | ORAL | Status: DC

## 2013-10-06 NOTE — Progress Notes (Signed)
Patient ID: Amy Escobar, female   DOB: May 27, 1946, 68 y.o.   MRN: [id number]    Patient Name: Amy Escobar Date of Encounter: 10/06/2013  Primary Care Provider:  Renato Shin, MD Primary Cardiologist:  Dorothy Spark  Problem List   Past Medical History  Diagnosis Date  . Hyperlipidemia   . DM2 (diabetes mellitus, type 2)   . Hypertension   . Allergic rhinitis   . GERD (gastroesophageal reflux disease)   . Osteoporosis   . Uterine fibroid   . Alkaline phosphatase deficiency     w/u Ne  . Dyslipidemia   . Thyroid nodule     small  . Gout   . Anxiety   . Hemorrhoids    Past Surgical History  Procedure Laterality Date  . 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 Stress cardiolite  02/12/2006  . Echocardiogram (other)  01/16/2002  . Tubal ligation    . Toenail excision     Allergies  Allergies  Allergen Reactions  . Olmesartan Medoxomil     REACTION: headache    HPI  68 year old with hypertension, hyperlipidemia, and known insulin-dependent diabetes mellitus with dyspnea on exertion for which she underwent following tests:   GXT:  Has had DOE - referred here for GXT and also referred to pulmonary. States she is doing better. Trying to lose weight. For PFTs later this month. Today the patient exercised on the standard Bruce protocol for a total of 2:41 minutes.  Poor exercise tolerance.  Adequate blood pressure response.  Clinically negative for chest pain. Test was stopped due to dyspnea; PVCs and over a minute run of bigeminy PVCs..  EKG negative for ischemia. No significant arrhythmia noted.   Lexiscan myoview - negative for scar or ischemia 24 Holter - episodes of nonsustained VT the longest lasting 5 beats for which she underwent Lexiscan Myoview.  Referred for a cardiology consult. The patient denies any palpitations, chest pain, lower extremity edema, orthopnea, paroxysmal nocturnal dyspnea or prior syncope. She states that in the past she  used to be quite active walking for about a mile every day but she stopped in November since then she felt quite short of breath.   Home Medications  Prior to Admission medications   Medication Sig Start Date End Date Taking? Authorizing Provider  Blood Glucose Monitoring Suppl (ONE TOUCH ULTRA MINI) W/DEVICE KIT 1 Device by Does not apply route once. 11/25/12  Yes Renato Shin, MD  gabapentin (NEURONTIN) 300 MG capsule One three times a day 09/12/13  Yes Tanda Rockers, MD  naproxen (NAPROSYN) 500 MG tablet Take 1 tablet (500 mg total) by mouth 2 (two) times daily with a meal. 08/10/12  Yes Renato Shin, MD  NEXIUM 40 MG capsule TAKE 1 CAPSULE (40 MG TOTAL) BY MOUTH DAILY BEFORE BREAKFAST.   Yes Renato Shin, MD  ONE TOUCH ULTRA TEST test strip TEST BLOOD SUGAR THREE(3) TIMES DAILY 06/24/13  Yes Renato Shin, MD  [facility] DELICA LANCETS 78I [place] USE AS DIRECTED BY PRESCRIBER THREE TIMES A DAY   Yes Renato Shin, MD  simvastatin (ZOCOR) 80 MG tablet TAKE 1 TABLET BY MOUTH EVERY NIGHT AT BEDTIME   Yes Renato Shin, MD  valsartan-hydrochlorothiazide (DIOVAN-HCT) 160-12.5 MG per tablet TAKE 1 TABLET BY MOUTH DAILY 07/29/13  Yes Renato Shin, MD    Family History  Family History  Problem Relation Age of Onset  . Heart disease Mother   . Heart disease Maternal Grandfather   .  Rectal cancer Maternal Grandfather   . Stomach cancer Maternal Grandmother   . Heart disease Father     Social History  History   Social History  . Marital Status: Widowed    Spouse Name: N/A    Number of Children: 2  . 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 Smoking status: Former Smoker -- 0.25 packs/day for 5 years    Types: Cigarettes    Quit date: 06/29/1968  . Smokeless tobacco: Never Used  . Alcohol Use: No  . Drug Use: No  . Sexual Activity: Not on file   Other Topics Concern  . Not on file   Social History Narrative   Widowed 2010.       Review of Systems, as per HPI, otherwise negative General:  No chills, fever, night sweats or weight changes.  Cardiovascular:  No chest pain, dyspnea on exertion, edema, orthopnea, palpitations, paroxysmal nocturnal dyspnea. Dermatological: No rash, lesions/masses Respiratory: No cough, dyspnea Urologic: No hematuria, dysuria Abdominal:   No nausea, vomiting, diarrhea, bright red blood per rectum, melena, or hematemesis Neurologic:  No visual changes, wkns, changes in mental status. All other systems reviewed and are otherwise negative except as noted above.  Physical Exam  Blood pressure 122/67, pulse 87, height _0  (1.626 m), weight 176 lb (79.833 kg).  General: Pleasant, NAD Psych: Normal affect. Neuro: Alert and oriented X 3. Moves all extremities spontaneously. HEENT: Normal  Neck: Supple without bruits or JVD. Lungs:  Resp regular and unlabored, CTA. Heart: RRR no s3, s4, or murmurs. Abdomen: Soft, non-tender, non-distended, BS + x 4.  Extremities: No clubbing, cyanosis or edema. DP/PT/Radials 2+ and equal bilaterally.  Labs:  No results found for this basename: CKTOTAL, CKMB, TROPONINI,  in the last 72 hours Lab Results  Component Value Date   WBC 8.0 08/04/2013   HGB 13.5 08/04/2013   HCT 42.2 08/04/2013   MCV 86.9 08/04/2013   PLT 198.0 08/04/2013    Lab Results  Component Value Date   DDIMER 0.44 08/08/2013   No components found with this basename: POCBNP,     Component Value Date/Time   NA 141 08/04/2013 1409   K 3.6 08/04/2013 1409   CL 103 08/04/2013 1409   CO2 29 08/04/2013 1409   GLUCOSE 88 08/04/2013 1409   BUN 11 08/04/2013 1409   CREATININE 0.8 08/04/2013 1409   CALCIUM 9.3 08/04/2013 1409   PROT 7.7 08/04/2013 1409   ALBUMIN 4.2 08/04/2013 1409   AST 39* 08/04/2013 1409   ALT 39* 08/04/2013 1409   ALKPHOS 116 08/04/2013 1409   BILITOT 0.7 08/04/2013 1409   GFRNONAA >60 03/09/2010 0913   GFRAA  Value: >60        The eGFR has been calculated using the MDRD equation. This  calculation has not been validated in all clinical situations. eGFR's persistently <60 mL/min signify possible Chronic Kidney Disease. 03/09/2010 0913   Lab Results  Component Value Date   CHOL 173 08/04/2013   HDL 44.00 08/04/2013   LDLCALC 102* 08/04/2013   TRIG 137.0 08/04/2013   Accessory Clinical Findings  Echocardiogram - 09/20/2013 Study Conclusions  - Left ventricle: The cavity size was normal. Wall thickness was normal. Systolic function was low normal to mildly reduced. The estimated ejection fraction was in the range of 50% to 55%. Wall motion was normal; there were no regional wall motion abnormalities. Doppler parameters are consistent  with abnormal left ventricular relaxation (grade 1 diastolic dysfunction). - Aortic valve: There was no stenosis. - Mitral valve: Mildly calcified annulus. Normal thickness leaflets . Mild regurgitation. - Left atrium: The atrium was mildly dilated. - Right ventricle: The cavity size was normal. Systolic function was normal. - Tricuspid valve: Peak RV-RA gradient: 69m Hg (S). - Pulmonary arteries: PA peak pressure: 444mHg (S). - Systemic veins: IVC not visualized. Impressions:  - Normal LV size with low normal to mildly reduced systolic function, EF 5059-45%Normal RV size and systolic function. Mild MR. Mild pulmonary hypertension.  Lexiscan Myoview: Impression  Exercise Capacity: Lexiscan with low level exercise.  BP Response: Normal blood pressure response.  Clinical Symptoms: No symptoms.  ECG Impression: There are scattered PVCs.  Comparison with Prior Nuclear Study: No previous nuclear study performed  Overall Impression: Low risk stress nuclear study with apical thinning defect and small area of inferolateral bowel artifact.  LV Ejection Fraction: 53%. LV Wall Motion: NL LV Function; NL Wall Motion  ECG - sinus rhythm, normal EKG.   Assessment & Plan  A pleasant [age]ear old female who underwent extensive workup for  evaluation of dyspnea on exertion. A nuclear stress test showed normal ejection fraction no prior infarct and no ischemia. Normal ECG. Echocardiogram showed preserved LV ejection fraction, grade 1 diastolic dysfunction and mild pulmonary hypertension with PA pressure 47 mmHg. We'll start the patient on a low dose of amlodipine 2.5 mg daily to be taken at night. Since she takes for her Diovan in the morning. If she doesn't tolerate amlodipine with her a normal blood pressure we will decreased dose of Diovan. She also appears to be significantly deconditioned she is advised to start walking at least 5 times a week again. We will follow in 2 months to see if her symptoms have improved.  KaDorothy SparkMD, [facility]AR[facility]/03/2014, 10:37 AM

## 2013-10-06 NOTE — Patient Instructions (Signed)
Your physician has recommended you make the following change in your medication:   1. Start Amlodipine 2.5 mg 1 tablet by mouth daily.   Your physician recommends that you schedule a follow-up appointment in: 2 months with Dr. Meda Coffee.

## 2013-10-27 ENCOUNTER — Telehealth: Payer: Self-pay | Admitting: Nurse Practitioner

## 2013-10-27 NOTE — Telephone Encounter (Signed)
Received call through answering service.  Called pt back and it went straight to voice mail.

## 2013-10-31 ENCOUNTER — Other Ambulatory Visit: Payer: Self-pay | Admitting: Endocrinology

## 2013-11-28 ENCOUNTER — Telehealth: Payer: Self-pay | Admitting: Endocrinology

## 2013-11-28 NOTE — Telephone Encounter (Signed)
Patient would like to speak with assistant  Regarding her medication   Thank You :)

## 2013-11-28 NOTE — Telephone Encounter (Signed)
Pt advised and states she will try and go to Urgent care if not call in the morning to try and make appointment.

## 2013-11-28 NOTE — Telephone Encounter (Signed)
Called pt. She states that she has urine dipsticks at home and tested her urine and she states that she has a UTI and needs Cipro called into her pharmacy.  Please advise, Thanks!

## 2013-11-28 NOTE — Telephone Encounter (Signed)
[facility] today Ov here tomorrow 

## 2013-12-06 ENCOUNTER — Ambulatory Visit (INDEPENDENT_AMBULATORY_CARE_PROVIDER_SITE_OTHER): Payer: Medicare Other | Admitting: Cardiology

## 2013-12-06 ENCOUNTER — Encounter: Payer: Self-pay | Admitting: Cardiology

## 2013-12-06 VITALS — BP 121/80 | HR 92 | Ht 64.0 in | Wt 175.0 lb

## 2013-12-06 DIAGNOSIS — I272 Pulmonary hypertension, unspecified: Secondary | ICD-10-CM

## 2013-12-06 DIAGNOSIS — I2789 Other specified pulmonary heart diseases: Secondary | ICD-10-CM

## 2013-12-06 NOTE — Patient Instructions (Signed)
Your physician recommends that you continue on your current medications as directed. Please refer to the Current Medication list given to you today.  Your physician has requested that you have an echocardiogram. Echocardiography is a painless test that uses sound waves to create images of your heart. It provides your doctor with information about the size and shape of your heart and how well your heart's chambers and valves are working. This procedure takes approximately one hour. There are no restrictions for this procedure. HAVE THIS DONE PRIOR TO YOUR 6 MONTH FOLLOW-UP VISIT WITH DR Meda Coffee  Your physician wants you to follow-up in: [place] will receive a reminder letter in the mail two months in advance. If you don't receive a letter, please call our office to schedule the follow-up appointment.

## 2013-12-06 NOTE — Progress Notes (Signed)
Patient ID: BECKIE VISCARDI, female   DOB: 10/10/1945, 68 y.o.   MRN: [id number]     Patient Name: Amy Escobar Date of Encounter: 12/06/2013  Primary Care Provider:  Renato Shin, MD Primary Cardiologist:  Dorothy Spark  Problem List   Past Medical History  Diagnosis Date  . Hyperlipidemia   . DM2 (diabetes mellitus, type 2)   . Hypertension   . Allergic rhinitis   . GERD (gastroesophageal reflux disease)   . Osteoporosis   . Uterine fibroid   . Alkaline phosphatase deficiency     w/u Ne  . Dyslipidemia   . Thyroid nodule     small  . Gout   . Anxiety   . Hemorrhoids    Past Surgical History  Procedure Laterality Date  . 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 Stress cardiolite  02/12/2006  . Echocardiogram (other)  01/16/2002  . Tubal ligation    . Toenail excision     Allergies  Allergies  Allergen Reactions  . Olmesartan Medoxomil     REACTION: headache    HPI  68 year old with hypertension, hyperlipidemia, and known insulin-dependent diabetes mellitus with dyspnea on exertion for which she underwent following tests:   GXT:  Has had DOE - referred here for GXT and also referred to pulmonary. States she is doing better. Trying to lose weight. For PFTs later this month. Today the patient exercised on the standard Bruce protocol for a total of 2:41 minutes.  Poor exercise tolerance.  Adequate blood pressure response.  Clinically negative for chest pain. Test was stopped due to dyspnea; PVCs and over a minute run of bigeminy PVCs..  EKG negative for ischemia. No significant arrhythmia noted.   Lexiscan myoview - negative for scar or ischemia 24 Holter - episodes of nonsustained VT the longest lasting 5 beats for which she underwent Lexiscan Myoview.  Referred for a cardiology consult. The patient denies any palpitations, chest pain, lower extremity edema, orthopnea, paroxysmal nocturnal dyspnea or prior syncope. She states that in the past she  used to be quite active walking for about a mile every day but she stopped in November since then she felt quite short of breath.  She is coming after 2 months, normal stress test, echo showed mild MR, TR, RVSP 47 mmHg, she was started on 2.5 mg of amlodipine and feels significantly better. She exercises 5 x week and feels great. Mild dyspnea on moderate exertion.   Home Medications  Prior to Admission medications   Medication Sig Start Date End Date Taking? Authorizing Provider  Blood Glucose Monitoring Suppl (ONE TOUCH ULTRA MINI) W/DEVICE KIT 1 Device by Does not apply route once. 11/25/12  Yes Renato Shin, MD  gabapentin (NEURONTIN) 300 MG capsule One three times a day 09/12/13  Yes Tanda Rockers, MD  naproxen (NAPROSYN) 500 MG tablet Take 1 tablet (500 mg total) by mouth 2 (two) times daily with a meal. 08/10/12  Yes Renato Shin, MD  NEXIUM 40 MG capsule TAKE 1 CAPSULE (40 MG TOTAL) BY MOUTH DAILY BEFORE BREAKFAST.   Yes Renato Shin, MD  ONE TOUCH ULTRA TEST test strip TEST BLOOD SUGAR THREE(3) TIMES DAILY 06/24/13  Yes Renato Shin, MD  Dr [facility] DELICA LANCETS 48N MISC USE AS DIRECTED BY PRESCRIBER THREE TIMES A DAY   Yes Renato Shin, MD  simvastatin (ZOCOR) 80 MG tablet TAKE 1 TABLET BY MOUTH EVERY NIGHT AT BEDTIME   Yes Renato Shin, MD  valsartan-hydrochlorothiazide (DIOVAN-HCT) 160-12.5  MG per tablet TAKE 1 TABLET BY MOUTH DAILY 07/29/13  Yes Renato Shin, MD    Family History  Family History  Problem Relation Age of Onset  . Heart disease Mother   . Heart disease Maternal Grandfather   . Rectal cancer Maternal Grandfather   . Stomach cancer Maternal Grandmother   . Heart disease Father     Social History  History   Social History  . Marital Status: Widowed    Spouse Name: N/A    Number of Children: 2  . 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 Smoking status: Former Smoker -- 0.25 packs/day for 5 years     Types: Cigarettes    Quit date: 06/29/1968  . Smokeless tobacco: Never Used  . Alcohol Use: No  . Drug Use: No  . Sexual Activity: Not on file   Other Topics Concern  . Not on file   Social History Narrative   Widowed 2010.     Review of Systems, as per HPI, otherwise negative General:  No chills, fever, night sweats or weight changes.  Cardiovascular:  No chest pain, dyspnea on exertion, edema, orthopnea, palpitations, paroxysmal nocturnal dyspnea. Dermatological: No rash, lesions/masses Respiratory: No cough, dyspnea Urologic: No hematuria, dysuria Abdominal:   No nausea, vomiting, diarrhea, bright red blood per rectum, melena, or hematemesis Neurologic:  No visual changes, wkns, changes in mental status. All other systems reviewed and are otherwise negative except as noted above.  Physical Exam  Blood pressure 121/80, pulse 92, height '5\' 4"'  (1.626 m), weight 175 lb (79.379 kg).  General: Pleasant, NAD Psych: Normal affect. Neuro: Alert and oriented X 3. Moves all extremities spontaneously. HEENT: Normal  Neck: Supple without bruits or JVD. Lungs:  Resp regular and unlabored, CTA. Heart: RRR no s3, s4, or murmurs. Abdomen: Soft, non-tender, non-distended, BS + x 4.  Extremities: No clubbing, cyanosis or edema. DP/PT/Radials 2+ and equal bilaterally.  Labs:  No results found for this basename: CKTOTAL, CKMB, TROPONINI,  in the last 72 hours Lab Results  Component Value Date   WBC 8.0 08/04/2013   HGB 13.5 08/04/2013   HCT 42.2 08/04/2013   MCV 86.9 08/04/2013   PLT 198.0 08/04/2013    Lab Results  Component Value Date   DDIMER 0.44 08/08/2013   No components found with this basename: POCBNP,     Component Value Date/Time   NA 141 08/04/2013 1409   K 3.6 08/04/2013 1409   CL 103 08/04/2013 1409   CO2 29 08/04/2013 1409   GLUCOSE 88 08/04/2013 1409   BUN 11 08/04/2013 1409   CREATININE 0.8 08/04/2013 1409   CALCIUM 9.3 08/04/2013 1409   PROT 7.7 08/04/2013 1409   ALBUMIN 4.2 08/04/2013  1409   AST 39* 08/04/2013 1409   ALT 39* 08/04/2013 1409   ALKPHOS 116 08/04/2013 1409   BILITOT 0.7 08/04/2013 1409   GFRNONAA >60 03/09/2010 0913   GFRAA  Value: >60        The eGFR has been calculated using the MDRD equation. This calculation has not been validated in all clinical situations. eGFR's persistently <60 mL/min signify possible Chronic Kidney Disease. 03/09/2010 0913   Lab Results  Component Value Date   CHOL 173 08/04/2013   HDL 44.00 08/04/2013   LDLCALC 102* 08/04/2013   TRIG 137.0 08/04/2013   Accessory Clinical Findings  Echocardiogram - 09/20/2013 - Left ventricle: The cavity size was  normal. Wall thickness was normal. Systolic function was low normal to mildly reduced. The estimated ejection fraction was in the range of 50% to 55%. Wall motion was normal; there were no regional wall motion abnormalities. Doppler parameters are consistent with abnormal left ventricular relaxation (grade 1 diastolic dysfunction). - Aortic valve: There was no stenosis. - Mitral valve: Mildly calcified annulus. Normal thickness leaflets . Mild regurgitation. - Left atrium: The atrium was mildly dilated. - Right ventricle: The cavity size was normal. Systolic function was normal. - Tricuspid valve: Peak RV-RA gradient: 14m Hg (S). - Pulmonary arteries: PA peak pressure: 464mHg (S). - Systemic veins: IVC not visualized. Impressions:  - Normal LV size with low normal to mildly reduced systolic function, EF 5068-85%Normal RV size and systolic function. Mild MR. Mild pulmonary hypertension.  Lexiscan Myoview: Impression  Exercise Capacity: Lexiscan with low level exercise.  BP Response: Normal blood pressure response.  Clinical Symptoms: No symptoms.  ECG Impression: There are scattered PVCs.  Comparison with Prior Nuclear Study: No previous nuclear study performed  Overall Impression: Low risk stress nuclear study with apical thinning defect and small area of inferolateral bowel artifact.    LV Ejection Fraction: 53%. LV Wall Motion: NL LV Function; NL Wall Motion  ECG - sinus rhythm, normal EKG.    Assessment & Plan  A pleasant [age]ear old female who underwent extensive workup for evaluation of dyspnea on exertion. A nuclear stress test showed normal ejection fraction no prior infarct and no ischemia. Normal ECG. Echocardiogram showed preserved LV ejection fraction, grade 1 diastolic dysfunction and mild pulmonary hypertension with PA pressure 47 mmHg.  Normal stress test.  Improved DOE and functional capacity on amlodipine.  Follow up in 6 months with echo prior to the visit.    NEDorothy SparkMD, FASurgical Center At Cedar Knolls LLC/03/2014, 8:12 AM

## 2013-12-20 ENCOUNTER — Other Ambulatory Visit: Payer: Self-pay | Admitting: Internal Medicine

## 2013-12-20 ENCOUNTER — Encounter: Payer: Self-pay | Admitting: Internal Medicine

## 2013-12-20 ENCOUNTER — Other Ambulatory Visit (INDEPENDENT_AMBULATORY_CARE_PROVIDER_SITE_OTHER): Payer: Medicare Other

## 2013-12-20 ENCOUNTER — Ambulatory Visit (INDEPENDENT_AMBULATORY_CARE_PROVIDER_SITE_OTHER): Payer: Medicare Other | Admitting: Internal Medicine

## 2013-12-20 ENCOUNTER — Telehealth: Payer: Self-pay

## 2013-12-20 VITALS — BP 112/70 | HR 94 | Temp 99.0°F | Ht 64.5 in | Wt 173.4 lb

## 2013-12-20 DIAGNOSIS — I10 Essential (primary) hypertension: Secondary | ICD-10-CM

## 2013-12-20 DIAGNOSIS — R300 Dysuria: Secondary | ICD-10-CM

## 2013-12-20 DIAGNOSIS — N3000 Acute cystitis without hematuria: Secondary | ICD-10-CM

## 2013-12-20 DIAGNOSIS — M25569 Pain in unspecified knee: Secondary | ICD-10-CM

## 2013-12-20 DIAGNOSIS — R309 Painful micturition, unspecified: Secondary | ICD-10-CM

## 2013-12-20 DIAGNOSIS — M25561 Pain in right knee: Secondary | ICD-10-CM | POA: Insufficient documentation

## 2013-12-20 DIAGNOSIS — N390 Urinary tract infection, site not specified: Secondary | ICD-10-CM | POA: Insufficient documentation

## 2013-12-20 LAB — URINALYSIS, ROUTINE W REFLEX MICROSCOPIC
Bilirubin Urine: NEGATIVE
KETONES UR: NEGATIVE
Nitrite: NEGATIVE
PH: 6 (ref 5.0–8.0)
Specific Gravity, Urine: 1.02 (ref 1.000–1.030)
Total Protein, Urine: 100 — AB
UROBILINOGEN UA: 0.2 (ref 0.0–1.0)
Urine Glucose: NEGATIVE

## 2013-12-20 MED ORDER — CEPHALEXIN 500 MG PO CAPS: 500.0000 mg | ORAL_CAPSULE | Freq: Four times a day (QID) | ORAL | Status: DC

## 2013-12-20 NOTE — Assessment & Plan Note (Signed)
?   DJD vs other - for sport med referral, tylenol prn

## 2013-12-20 NOTE — Assessment & Plan Note (Signed)
stable overall by history and exam, recent data reviewed with pt, and pt to continue medical treatment as before,  to f/u any worsening symptoms or concerns BP Readings from Last 3 Encounters:  12/20/13 112/70  12/06/13 121/80  10/06/13 122/67

## 2013-12-20 NOTE — Progress Notes (Signed)
Subjective:    Patient ID: Amy Escobar, female    DOB: [birth year], 68 y.o.   MRN: [id number]  HPI  Here to f/u, c/o acute onset 3 days urinary symptoms with dysuria, frequency, but no  urgency, flank pain, hematuria or n/v, fever, chills.  Also had 1 wk worsening right knee pain and swelling, limps to walk occasionally, no giveaways or falls. No prior hx of same, pain seems occas worse to post right knee.  Pt denies chest pain, increased sob or doe, wheezing, orthopnea, PND, increased LE swelling, palpitations, dizziness or syncope.  Pt denies new neurological symptoms such as new headache, or facial or extremity weakness or numbness   Pt denies polydipsia, polyuria Past Medical History  Diagnosis Date  . Hyperlipidemia   . DM2 (diabetes mellitus, type 2)   . Hypertension   . Allergic rhinitis   . GERD (gastroesophageal reflux disease)   . Osteoporosis   . Uterine fibroid   . Alkaline phosphatase deficiency     w/u Ne  . Dyslipidemia   . Thyroid nodule     small  . Gout   . Anxiety   . Hemorrhoids    Past Surgical History  Procedure Laterality Date  . 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 Stress cardiolite  02/12/2006  . Echocardiogram (other)  01/16/2002  . Tubal ligation    . Toenail excision      reports that she quit smoking about 45 years ago. Her smoking use included Cigarettes. She has a 1.25 pack-year smoking history. She has never used smokeless tobacco. She reports that she does not drink alcohol or use illicit drugs. family history includes Heart disease in her father, maternal grandfather, and mother; Rectal cancer in her maternal grandfather; Stomach cancer in her maternal grandmother. Allergies  Allergen Reactions  . Olmesartan Medoxomil     REACTION: headache   Current Outpatient Prescriptions on File Prior to Visit  Medication Sig Dispense Refill  . amLODipine (NORVASC) 2.5 MG tablet Take 1 tablet (2.5 mg total) by mouth daily.  30 tablet  6  .  Blood Glucose Monitoring Suppl (ONE TOUCH ULTRA MINI) W/DEVICE KIT 1 Device by Does not apply route once.  1 each  0  . gabapentin (NEURONTIN) 300 MG capsule One three times a day  90 capsule  3  . naproxen (NAPROSYN) 500 MG tablet Take 1 tablet (500 mg total) by mouth 2 (two) times daily with a meal.  60 tablet  11  . NEXIUM 40 MG capsule TAKE 1 CAPSULE (40 MG TOTAL) BY MOUTH DAILY BEFORE BREAKFAST.  30 capsule  10  . ONE TOUCH ULTRA TEST test strip TEST BLOOD SUGAR THREE TIMES A DAY  100 each  2  . ONETOUCH DELICA LANCETS 06T MISC USE AS DIRECTED BY PRESCRIBER THREE TIMES A DAY  300 each  9  . simvastatin (ZOCOR) 80 MG tablet TAKE 1 TABLET BY MOUTH EVERY NIGHT AT BEDTIME  30 tablet  2  . valsartan-hydrochlorothiazide (DIOVAN-HCT) 160-12.5 MG per tablet TAKE 1 TABLET BY MOUTH DAILY  30 tablet  9   No current facility-administered medications on file prior to visit.   Review of Systems  Constitutional: Negative for unusual diaphoresis or other sweats  HENT: Negative for ringing in ear Eyes: Negative for double vision or worsening visual disturbance.  Respiratory: Negative for choking and stridor.   Gastrointestinal: Negative for vomiting or other signifcant bowel change Genitourinary: Negative for hematuria or decreased urine volume.  Musculoskeletal: Negative for other MSK pain or swelling Skin: Negative for color change and worsening wound.  Neurological: Negative for tremors and numbness other than noted  Psychiatric/Behavioral: Negative for decreased concentration or agitation other than above       Objective:   Physical Exam BP 112/70  Pulse 94  Temp(Src) 99 F (37.2 C) (Oral)  Ht 5' 4.5" (1.638 m)  Wt 173 lb 6 oz (78.642 kg)  BMI 29.31 kg/m2  SpO2 97% VS noted, mild ill Constitutional: Pt appears well-developed, well-nourished.  HENT: Head: NCAT.  Right Ear: External ear normal.  Left Ear: External ear normal.  Eyes: . Pupils are equal, round, and reactive to light.  Conjunctivae and EOM are normal Neck: Normal range of motion. Neck supple.  Cardiovascular: Normal rate and regular rhythm.   Pulmonary/Chest: Effort normal and breath sounds normal.  Abd:  Soft, ND, + BS with mild low mid abd tender, no guarding , rebound, no flank tender Neurological: Pt is alert. Not confused , motor grossly intact Skin: Skin is warm. No rash Right knee with bony degen changes, diffuse small effusion, FROM, NT Psychiatric: Pt behavior is normal. No agitation.      Assessment & Plan:

## 2013-12-20 NOTE — Patient Instructions (Signed)
Please take all new medication as prescribed  Please continue all other medications as before, and refills have been done if requested.  Please have the pharmacy call with any other refills you may need.  You will be contacted regarding the referral for: Dr Tamala Julian, sport medicine

## 2013-12-20 NOTE — Assessment & Plan Note (Signed)
Mild to mod, for antibx course,  to f/u any worsening symptoms or concerns 

## 2013-12-20 NOTE — Telephone Encounter (Signed)
UA ordered for painful urination.

## 2013-12-20 NOTE — Progress Notes (Signed)
Pre visit review using our clinic review tool, if applicable. No additional management support is needed unless otherwise documented below in the visit note. 

## 2013-12-25 ENCOUNTER — Ambulatory Visit (INDEPENDENT_AMBULATORY_CARE_PROVIDER_SITE_OTHER): Payer: Medicare Other | Admitting: Family Medicine

## 2013-12-25 ENCOUNTER — Other Ambulatory Visit (INDEPENDENT_AMBULATORY_CARE_PROVIDER_SITE_OTHER): Payer: Medicare Other

## 2013-12-25 ENCOUNTER — Encounter: Payer: Self-pay | Admitting: Family Medicine

## 2013-12-25 ENCOUNTER — Other Ambulatory Visit: Payer: Self-pay | Admitting: Endocrinology

## 2013-12-25 VITALS — BP 112/64 | HR 79 | Ht 64.5 in | Wt 172.0 lb

## 2013-12-25 DIAGNOSIS — M25561 Pain in right knee: Secondary | ICD-10-CM

## 2013-12-25 DIAGNOSIS — M25569 Pain in unspecified knee: Secondary | ICD-10-CM

## 2013-12-25 DIAGNOSIS — S86819A Strain of other muscle(s) and tendon(s) at lower leg level, unspecified leg, initial encounter: Secondary | ICD-10-CM

## 2013-12-25 DIAGNOSIS — S86119A Strain of other muscle(s) and tendon(s) of posterior muscle group at lower leg level, unspecified leg, initial encounter: Secondary | ICD-10-CM | POA: Insufficient documentation

## 2013-12-25 DIAGNOSIS — S86111A Strain of other muscle(s) and tendon(s) of posterior muscle group at lower leg level, right leg, initial encounter: Secondary | ICD-10-CM

## 2013-12-25 DIAGNOSIS — S838X9A Sprain of other specified parts of unspecified knee, initial encounter: Secondary | ICD-10-CM

## 2013-12-25 NOTE — Assessment & Plan Note (Signed)
Patient does have a small tear in the medial head proximally and the gastrocnemius muscle. Patient will try conservative therapy he was given compression sleeve was fitted by me today. We discussed icing protocol as well as home exercise program. We discussed over-the-counter medications he can begin attended to in the naproxen daily for 5 days. Patient will come back and see me again in 3 weeks for further evaluation and likely will make sure under ultrasound the patient is healing.

## 2013-12-25 NOTE — Patient Instructions (Signed)
Good to meet you ICe 20 minutes 2 times a day Wear heel lift in your shoe and wear good shoes like tennis shoes.  Wear calf sleeve daily for next week and then only with exercises for another 2 weeks.  Vitamin D 2000 IU daily.  Exercises 3 times a week for the next 4 weeks but don't start for 3 days.  Come back in 3 weeks to make sure it is healing.

## 2013-12-25 NOTE — Progress Notes (Signed)
[name] [address] [place], [place] [address] Phone: [phone] Subjective:    I'm seeing this patient by the request  of:  Renato Shin, MD   CC: Right knee pain  Amy Escobar is a 68 y.o. female coming in with complaint of right knee pain. Patient states it has been hurting a little bit over a week. Patient does go to a lot classes and noticed the pain after being in a cycling class. Patient states it was very difficult to walk going into the weekend. Patient states that since then his started to get a little bit better. Patient has been able to ambulate and denies any true swelling. Patient feels that the pain is mostly in the posterior aspect of the knee. Denies any clicking or giving out on her. Patient states is more of a sore feeling they can be very sharp with certain activity especially going upstairs. Denies any radiation in the leg or any numbness or tingling. Rates the severity of 7/10.     Past medical history, social, surgical and family history all reviewed in electronic medical record.   Review of Systems: No headache, visual changes, nausea, vomiting, diarrhea, constipation, dizziness, abdominal pain, skin rash, fevers, chills, night sweats, weight loss, swollen lymph nodes, body aches, joint swelling, muscle aches, chest pain, shortness of breath, mood changes.   Objective Blood pressure 112/64, pulse 79, height 5' 4.5" (1.638 m), weight 172 lb (78.019 kg), SpO2 95.00%.  General: No apparent distress alert and oriented x3 mood and affect normal, dressed appropriately.  HEENT: Pupils equal, extraocular movements intact  Respiratory: Patient's speak in full sentences and does not appear short of breath  Cardiovascular: No lower extremity edema, non tender, no erythema  Skin: Warm dry intact with no signs of infection or rash on extremities or on axial skeleton.  Abdomen: Soft nontender  Neuro: Cranial nerves II  through XII are intact, neurovascularly intact in all extremities with 2+ DTRs and 2+ pulses.  Lymph: No lymphadenopathy of posterior or anterior cervical chain or axillae bilaterally.  Gait normal with good balance and coordination.  MSK:  Non tender with full range of motion and good stability and symmetric strength and tone of shoulders, elbows, wrist, hip, and ankles bilaterally.  Knee: Right Normal to inspection with no erythema or effusion or obvious bony abnormalities. Palpation normal with no warmth, joint line tenderness, patellar tenderness, or condyle tenderness. Patient though is tender to palpation in the right gastrocnemius medial head. ROM full in flexion and extension and lower leg rotation. Ligaments with solid consistent endpoints including ACL, PCL, LCL, MCL. Negative Mcmurray's, Apley's, and Thessalonian tests. Non painful patellar compression. Patellar glide without crepitus. Patellar and quadriceps tendons unremarkable. Hamstring and quadriceps strength is normal.  Contralateral knee unremarkable  MSK US performed of: Right knee This study was ordered, performed, and interpreted by Charlann Boxer D.O.  Knee: All structures visualized. Anteromedial, anterolateral, posteromedial, and posterolateral menisci unremarkable without tearing, fraying, effusion, or displacement. Mild narrowing of the joint spaces medially and laterally. Patellar Tendon unremarkable on long and transverse views without effusion. No abnormality of prepatellar bursa. LCL and MCL unremarkable on long and transverse views. Patient does have this appears to be a resolving hematoma within the proximal medial gastrocnemius head. Small tear seen with increasing Doppler flow.  IMPRESSION:  Gastrocnemius tear proximal medial head.     Impression and Recommendations:     This case required medical decision making of  moderate complexity.

## 2014-01-01 ENCOUNTER — Ambulatory Visit: Payer: Medicare Other | Admitting: Family Medicine

## 2014-01-09 ENCOUNTER — Telehealth: Payer: Self-pay | Admitting: Endocrinology

## 2014-01-09 NOTE — Telephone Encounter (Signed)
Pt advised. Coming tomorrow at 8 am.

## 2014-01-09 NOTE — Telephone Encounter (Signed)
There is now a "booster" pneumonia shot called "prevnar."  That is a good idea.  We would be happy to give this to you.  However, do you need to be seen for the possible pneumonia? What symptoms do you have?

## 2014-01-09 NOTE — Telephone Encounter (Signed)
Ov 8 AM tomorrow

## 2014-01-09 NOTE — Telephone Encounter (Signed)
Called pt and she states that she has been coughing a lot with mucus production and left breast pain. Pt denies sob.

## 2014-01-09 NOTE — Telephone Encounter (Signed)
Patient would like to know when her last pneumonia vaccine and does she need a booster  She thinks she may have pneumonia   Call back :[place]  Please advise patient

## 2014-01-09 NOTE — Telephone Encounter (Signed)
See below. Pt received Pneumo vacc back in 2012.  Please advise, Thanks!

## 2014-01-10 ENCOUNTER — Ambulatory Visit (INDEPENDENT_AMBULATORY_CARE_PROVIDER_SITE_OTHER): Payer: Medicare Other | Admitting: Endocrinology

## 2014-01-10 ENCOUNTER — Ambulatory Visit
Admission: RE | Admit: 2014-01-10 | Discharge: 2014-01-10 | Disposition: A | Discharge status: Home / Self Care | Payer: Medicare Other | Source: Ambulatory Visit | Attending: Endocrinology | Admitting: Endocrinology

## 2014-01-10 ENCOUNTER — Encounter: Payer: Self-pay | Admitting: Endocrinology

## 2014-01-10 VITALS — BP 112/80 | HR 75 | Temp 98.7°F | Ht 64.5 in | Wt 173.0 lb

## 2014-01-10 DIAGNOSIS — R05 Cough: Secondary | ICD-10-CM

## 2014-01-10 DIAGNOSIS — Z23 Encounter for immunization: Secondary | ICD-10-CM

## 2014-01-10 DIAGNOSIS — E119 Type 2 diabetes mellitus without complications: Secondary | ICD-10-CM

## 2014-01-10 DIAGNOSIS — K769 Liver disease, unspecified: Secondary | ICD-10-CM

## 2014-01-10 DIAGNOSIS — R059 Cough, unspecified: Secondary | ICD-10-CM

## 2014-01-10 LAB — HEMOGLOBIN A1C: HEMOGLOBIN A1C: 6.8 % — AB (ref 4.6–6.5)

## 2014-01-10 MED ORDER — AZITHROMYCIN 500 MG PO TABS: 500.0000 mg | ORAL_TABLET | Freq: Every day | ORAL | Status: DC

## 2014-01-10 NOTE — Patient Instructions (Signed)
i have sent a prescription to your pharmacy, for an antibiotic pill. A chest-x-ray, and blood tests are being requested for you today.  We'll contact you with results. I hope you feel better soon.  If you don't feel better by next week, please call back.  Please call sooner if you get worse.

## 2014-01-10 NOTE — Progress Notes (Signed)
Subjective:    Patient ID: Amy Escobar, female    DOB: 04/27/1946, 68 y.o.   MRN: [id number]  HPI Pt states 4 days of moderate prod-quality cough in the chest, and assoc pain.  Denies nasal congestion, but she has a sore throat.  No earache. Past Medical History  Diagnosis Date  . Hyperlipidemia   . DM2 (diabetes mellitus, type 2)   . Hypertension   . Allergic rhinitis   . GERD (gastroesophageal reflux disease)   . Osteoporosis   . Uterine fibroid   . Alkaline phosphatase deficiency     w/u Ne  . Dyslipidemia   . Thyroid nodule     small  . Gout   . Anxiety   . Hemorrhoids     Past Surgical History  Procedure Laterality Date  . 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 Stress cardiolite  02/12/2006  . Echocardiogram (other)  01/16/2002  . Tubal ligation    . Toenail excision      History   Social History  . Marital Status: Widowed    Spouse Name: N/A    Number of Children: 2  . 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 Smoking status: Former Smoker -- 0.25 packs/day for 5 years    Types: Cigarettes    Quit date: 06/29/1968  . Smokeless tobacco: Never Used  . Alcohol Use: No  . Drug Use: No  . Sexual Activity: Not on file   Other Topics Concern  . Not on file   Social History Narrative   Widowed 2010.     Current Outpatient Prescriptions on File Prior to Visit  Medication Sig Dispense Refill  . amLODipine (NORVASC) 2.5 MG tablet Take 1 tablet (2.5 mg total) by mouth daily.  30 tablet  6  . Blood Glucose Monitoring Suppl (ONE TOUCH ULTRA MINI) W/DEVICE KIT 1 Device by Does not apply route once.  1 each  0  . gabapentin (NEURONTIN) 300 MG capsule One three times a day  90 capsule  3  . naproxen (NAPROSYN) 500 MG tablet TAKE 1 TABLET (500 MG TOTAL) BY MOUTH 2 (TWO) TIMES DAILY WITH A MEAL.  60 tablet  10  . NEXIUM 40 MG capsule TAKE 1 CAPSULE (40 MG TOTAL) BY MOUTH DAILY BEFORE BREAKFAST.  30  capsule  10  . ONE TOUCH ULTRA TEST test strip TEST BLOOD SUGAR THREE TIMES A DAY  100 each  2  . ONETOUCH DELICA LANCETS 20N MISC USE AS DIRECTED BY PRESCRIBER THREE TIMES A DAY  300 each  9  . simvastatin (ZOCOR) 80 MG tablet TAKE 1 TABLET BY MOUTH EVERY NIGHT AT BEDTIME  30 tablet  2  . valsartan-hydrochlorothiazide (DIOVAN-HCT) 160-12.5 MG per tablet TAKE 1 TABLET BY MOUTH DAILY  30 tablet  9   No current facility-administered medications on file prior to visit.    Allergies  Allergen Reactions  . Olmesartan Medoxomil     REACTION: headache    Family History  Problem Relation Age of Onset  . Heart disease Mother   . Heart disease Maternal Grandfather   . Rectal cancer Maternal Grandfather   . Stomach cancer Maternal Grandmother   . Heart disease Father     BP 112/80  Pulse 75  Temp(Src) 98.7 F (37.1 C) (Oral)  Ht 5' 4.5" (1.638 m)  Wt 173 lb (78.472 kg)  BMI 29.25 kg/m2  SpO2 95%  Review of Systems Denies fever and sob.      Objective:   Physical Exam VITAL SIGNS:  See vs page GENERAL: no distress head: no deformity eyes: no periorbital swelling, no proptosis external nose and ears are normal mouth: no lesion seen Both tm's are red LUNGS:  Clear to auscultation.   CXR: NAD Lab Results  Component Value Date   HGBA1C 6.8* 01/10/2014   Lab Results  Component Value Date   ALT 39* 08/04/2013   AST 39* 08/04/2013   ALKPHOS 116 08/04/2013   BILITOT 0.7 08/04/2013       Assessment & Plan:  URI, new. Chronic cough, mild exacerbation.  Will eval if persists. DM: mild, exacerbation.  No med needed now.  We'll follow.  Patient is advised the following: Patient Instructions  i have sent a prescription to your pharmacy, for an antibiotic pill. A chest-x-ray, and blood tests are being requested for you today.  We'll contact you with results. I hope you feel better soon.  If you don't feel better by next week, please call back.  Please call sooner if you get  worse.

## 2014-01-11 LAB — HEPATITIS C ANTIBODY: HCV AB: NEGATIVE

## 2014-01-11 LAB — HEPATITIS B SURFACE ANTIGEN: HEP B S AG: NEGATIVE

## 2014-01-15 ENCOUNTER — Ambulatory Visit (INDEPENDENT_AMBULATORY_CARE_PROVIDER_SITE_OTHER): Payer: Medicare Other | Admitting: Family Medicine

## 2014-01-15 ENCOUNTER — Other Ambulatory Visit: Payer: Medicare Other

## 2014-01-15 ENCOUNTER — Encounter: Payer: Self-pay | Admitting: Family Medicine

## 2014-01-15 VITALS — BP 116/72 | HR 82 | Ht 64.5 in | Wt 174.0 lb

## 2014-01-15 DIAGNOSIS — S86111D Strain of other muscle(s) and tendon(s) of posterior muscle group at lower leg level, right leg, subsequent encounter: Secondary | ICD-10-CM

## 2014-01-15 DIAGNOSIS — S838X9A Sprain of other specified parts of unspecified knee, initial encounter: Secondary | ICD-10-CM

## 2014-01-15 DIAGNOSIS — Z5189 Encounter for other specified aftercare: Secondary | ICD-10-CM

## 2014-01-15 DIAGNOSIS — S86819A Strain of other muscle(s) and tendon(s) at lower leg level, unspecified leg, initial encounter: Secondary | ICD-10-CM

## 2014-01-15 DIAGNOSIS — M25569 Pain in unspecified knee: Secondary | ICD-10-CM

## 2014-01-15 DIAGNOSIS — M25561 Pain in right knee: Secondary | ICD-10-CM

## 2014-01-15 NOTE — Assessment & Plan Note (Signed)
Patient is improving at this time. Continue the exercises 3 times a week for another 6 weeks. We discussed the icing and compression and why that is beneficial. Patient was able to do all activities today living and as long as she does not have any setback she can followup on an as-needed basis.

## 2014-01-15 NOTE — Progress Notes (Signed)
  [facility] [place], [place] [address] Phone: [phone] Subjective:     CC: Right knee pain f/u  [id number] Amy Escobar is a 68 y.o. female coming in with complaint of right knee pain. Patient was seen previously and was actually diagnosed with a proximal gastrocnemius tear. Patient was given a heel lift, home exercises, compression sleeve icing regimen. Patient states she is doing considerably better. Patient is able to do all activities today living and has been going to the gym on a regular basis. Patient has been doing home exercises and has not been wearing a brace because it feels significantly better. Patient still has some mild anterior knee pain but overall is doing very well. Denies any new symptoms to some mild worsening of the anterior knee pain.     Past medical history, social, surgical and family history all reviewed in electronic medical record.   Review of Systems: No headache, visual changes, nausea, vomiting, diarrhea, constipation, dizziness, abdominal pain, skin rash, fevers, chills, night sweats, weight loss, swollen lymph nodes, body aches, joint swelling, muscle aches, chest pain, shortness of breath, mood changes.   Objective Blood pressure 116/72, pulse 82, height 5' 4.5" (1.638 m), weight 174 lb (78.926 kg), SpO2 97.00%.  General: No apparent distress alert and oriented x3 mood and affect normal, dressed appropriately.  HEENT: Pupils equal, extraocular movements intact  Respiratory: Patient's speak in full sentences and does not appear short of breath  Cardiovascular: No lower extremity edema, non tender, no erythema  Skin: Warm dry intact with no signs of infection or rash on extremities or on axial skeleton.  Abdomen: Soft nontender  Neuro: Cranial nerves II through XII are intact, neurovascularly intact in all extremities with 2+ DTRs and 2+ pulses.  Lymph: No lymphadenopathy of posterior or anterior  cervical chain or axillae bilaterally.  Gait normal with good balance and coordination.  MSK:  Non tender with full range of motion and good stability and symmetric strength and tone of shoulders, elbows, wrist, hip, and ankles bilaterally.  Knee: Right Normal to inspection with no erythema or effusion or obvious bony abnormalities. Palpation normal with no warmth, joint line tenderness, patellar tenderness, or condyle tenderness. ROM full in flexion and extension and lower leg rotation. Ligaments with solid consistent endpoints including ACL, PCL, LCL, MCL. Negative Mcmurray's, Apley's, and Thessalonian tests. Non painful patellar compression. Patellar glide without crepitus. Patellar and quadriceps tendons unremarkable. Hamstring and quadriceps strength is normal.  Contralateral knee unremarkable  MSK US performed of: Right knee This study was ordered, performed, and interpreted by Charlann Boxer D.O.  Knee: All structures visualized. Anteromedial, anterolateral, posteromedial, and posterolateral menisci unremarkable without tearing, fraying, effusion, or displacement. Mild narrowing of the joint spaces medially and laterally. Patellar Tendon unremarkable on long and transverse views without effusion. No abnormality of prepatellar bursa. LCL and MCL unremarkable on long and transverse views. The patient's hematoma that was previously seen in the proximal medial gastroc head is completely resolved at this time. Patient does have good scar tissue in the area of where the tear was.  IMPRESSION:  Resolving gastrocnemius tear of the proximal medial head.     Impression and Recommendations:     This case required medical decision making of moderate complexity.

## 2014-01-15 NOTE — Patient Instructions (Signed)
Good to see you Try the pennsaid 2 times daily.  If you like it then call and I will send it in for you.  Exercises 3 times a week.  Ice 20 minutes 2 times daily. Usually after activity and before bed.  Come back when you need me.

## 2014-01-15 NOTE — Assessment & Plan Note (Signed)
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 Patient was given home exercises and another handout form and we discussed which activities to be beneficial and which ones to avoid. Patient will try to make these changes and come back and see me again in 3-4 weeks if pain continues. I would consider an intra-articular injection.

## 2014-01-16 ENCOUNTER — Other Ambulatory Visit: Payer: Self-pay | Admitting: Endocrinology

## 2014-01-16 ENCOUNTER — Telehealth: Payer: Self-pay | Admitting: Endocrinology

## 2014-01-16 NOTE — Telephone Encounter (Signed)
Non-prescription loratadine is good for this.

## 2014-01-16 NOTE — Telephone Encounter (Signed)
Pt called wanting to know if she needs a refill on the Antibiotic given during her last visit. Pt states that he throat is felling better, but is still sore. Also, her ears are still itchy.  Please advise, Thanks!

## 2014-01-16 NOTE — Telephone Encounter (Signed)
Pt advised.

## 2014-01-16 NOTE — Telephone Encounter (Signed)
Patient would like you to call her please.  Thank you. °

## 2014-01-19 ENCOUNTER — Telehealth: Payer: Self-pay | Admitting: Interventional Cardiology

## 2014-01-19 ENCOUNTER — Telehealth: Payer: Self-pay | Admitting: Family Medicine

## 2014-01-19 MED ORDER — DICLOFENAC SODIUM 2 % TD SOLN
TRANSDERMAL | Status: DC
Start: 2014-01-19 — End: 2014-01-29

## 2014-01-19 NOTE — Telephone Encounter (Signed)
01-19-14 pt left message on my voicemail re samples of an ointment dr Tamala Julian gave her for her knee, she would know like a presciption called in

## 2014-01-19 NOTE — Telephone Encounter (Signed)
returned pt call.pt does not see Dr.Smith for Cradilolgy.. pt adv to contact her pcp. pt verbalized understanding.

## 2014-01-19 NOTE — Telephone Encounter (Signed)
Pt request for Dr. Tamala Julian to order pennsaid for her knee. Please advise.

## 2014-01-19 NOTE — Telephone Encounter (Signed)
rx sent to pharmacy.  Pt made aware.  

## 2014-01-26 ENCOUNTER — Other Ambulatory Visit: Payer: Self-pay | Admitting: Endocrinology

## 2014-01-27 ENCOUNTER — Other Ambulatory Visit: Payer: Self-pay | Admitting: Internal Medicine

## 2014-01-27 ENCOUNTER — Other Ambulatory Visit: Payer: Self-pay | Admitting: Cardiology

## 2014-01-29 ENCOUNTER — Ambulatory Visit (INDEPENDENT_AMBULATORY_CARE_PROVIDER_SITE_OTHER)
Admission: RE | Admit: 2014-01-29 | Discharge: 2014-01-29 | Disposition: A | Discharge status: Home / Self Care | Payer: Medicare Other | Source: Ambulatory Visit | Attending: Family Medicine | Admitting: Family Medicine

## 2014-01-29 ENCOUNTER — Ambulatory Visit (INDEPENDENT_AMBULATORY_CARE_PROVIDER_SITE_OTHER): Payer: Medicare Other | Admitting: Family Medicine

## 2014-01-29 ENCOUNTER — Other Ambulatory Visit: Payer: Self-pay | Admitting: Internal Medicine

## 2014-01-29 ENCOUNTER — Telehealth: Payer: Self-pay | Admitting: Family Medicine

## 2014-01-29 ENCOUNTER — Encounter: Payer: Self-pay | Admitting: Family Medicine

## 2014-01-29 VITALS — BP 132/84 | HR 92

## 2014-01-29 DIAGNOSIS — M25569 Pain in unspecified knee: Secondary | ICD-10-CM

## 2014-01-29 DIAGNOSIS — M25561 Pain in right knee: Secondary | ICD-10-CM

## 2014-01-29 MED ORDER — DICLOFENAC SODIUM 2 % TD SOLN: TRANSDERMAL | Status: DC

## 2014-01-29 NOTE — Assessment & Plan Note (Signed)
Patient was given an injection today for presumed osteophytic changes. I do not see any findings that would suggest a meniscal injury at this time. Patient will have x-rays done today for further evaluation of bony abnormalities. Patient will do the icing protocol and we will do topical anti-inflammatory. Patient was given a brace was fitted by me today. Patient will followup with me in 3 weeks for further evaluation and treatment. Patient is able to ambulate without any difficulty.  Spent greater than 25 minutes with patient face-to-face and had greater than 50% of counseling including as described above in assessment and plan.

## 2014-01-29 NOTE — Progress Notes (Signed)
  [facility] [place], [place] [address] Phone: [phone] Subjective:     CC: Right knee pain f/u  [id number] Amy Escobar is a 68 y.o. female coming in with complaint of right knee pain. Patient did have a tear of the proximal head of the gastrocnemius previously. Patient though has had right knee pain for quite some time. Patient was at church yesterday and rotated and felt a pop. Patient states that the knee did swell does seem to come down until he quickly. Patient states that she was having severe pain though even with ambulation. Patient is here for further evaluation. Denies any clicking popping or giving on her now. States that it is difficult to walk secondary to the pain..     Past medical history, social, surgical and family history all reviewed in electronic medical record.   Review of Systems: No headache, visual changes, nausea, vomiting, diarrhea, constipation, dizziness, abdominal pain, skin rash, fevers, chills, night sweats, weight loss, swollen lymph nodes, body aches, joint swelling, muscle aches, chest pain, shortness of breath, mood changes.   Objective Blood pressure 132/84, pulse 92, SpO2 99.00%.  General: No apparent distress alert and oriented x3 mood and affect normal, dressed appropriately.  HEENT: Pupils equal, extraocular movements intact  Respiratory: Patient's speak in full sentences and does not appear short of breath  Cardiovascular: No lower extremity edema, non tender, no erythema  Skin: Warm dry intact with no signs of infection or rash on extremities or on axial skeleton.  Abdomen: Soft nontender  Neuro: Cranial nerves II through XII are intact, neurovascularly intact in all extremities with 2+ DTRs and 2+ pulses.  Lymph: No lymphadenopathy of posterior or anterior cervical chain or axillae bilaterally.  Gait normal with good balance and coordination.  MSK:  Non tender with full range of motion and  good stability and symmetric strength and tone of shoulders, elbows, wrist, hip, and ankles bilaterally.  Knee: Right Normal to inspection with no erythema or effusion or obvious bony abnormalities. Patient is tender to palpation mostly over the medial joint line ROM full in flexion and extension and lower leg rotation. Ligaments with solid consistent endpoints including ACL, PCL, LCL, MCL. Negative Mcmurray's, Apley's, and Thessalonian tests. Non painful patellar compression. Patellar glide without crepitus. Patellar and quadriceps tendons unremarkable. Hamstring and quadriceps strength is normal.  Contralateral knee unremarkable  After informed written and verbal consent, patient was seated on exam table. Right knee was prepped with alcohol swab and utilizing anterolateral approach, patient's right knee space was injected with 4:1  marcaine 0.5%: Kenalog 40mg /dL. Patient tolerated the procedure well without immediate complications.     Impression and Recommendations:     This case required medical decision making of moderate complexity.

## 2014-01-29 NOTE — Telephone Encounter (Signed)
Pt request phone from the assistant, pt stated she has info to give, will only tell the assistant. Please call pt

## 2014-01-29 NOTE — Patient Instructions (Signed)
Good to see you Ice 20 minutes still 2 times a day Try the brace with activity Exercises 3 times a week.  Come back in 3 weeks to make sure doing ok.

## 2014-01-29 NOTE — Telephone Encounter (Signed)
Spoke to pt, appt scheduled at 12:30p

## 2014-02-08 ENCOUNTER — Telehealth: Payer: Self-pay | Admitting: *Deleted

## 2014-02-08 DIAGNOSIS — M25561 Pain in right knee: Secondary | ICD-10-CM

## 2014-02-08 NOTE — Telephone Encounter (Signed)
Left msg on triage requesting call back from Amy Escobar. She is still having increase pain...Amy Escobar

## 2014-02-08 NOTE — Telephone Encounter (Signed)
Discussed with pt, entered mri

## 2014-02-09 ENCOUNTER — Ambulatory Visit
Admission: RE | Admit: 2014-02-09 | Discharge: 2014-02-09 | Disposition: A | Discharge status: Home / Self Care | Payer: Medicare Other | Source: Ambulatory Visit | Attending: Family Medicine | Admitting: Family Medicine

## 2014-02-09 DIAGNOSIS — M25561 Pain in right knee: Secondary | ICD-10-CM

## 2014-02-12 ENCOUNTER — Telehealth: Payer: Self-pay | Admitting: Family Medicine

## 2014-02-12 DIAGNOSIS — S83206D Unspecified tear of unspecified meniscus, current injury, right knee, subsequent encounter: Secondary | ICD-10-CM

## 2014-02-12 MED ORDER — TRAMADOL HCL 50 MG PO TABS: 50.0000 mg | ORAL_TABLET | Freq: Three times a day (TID) | ORAL | Status: DC | PRN

## 2014-02-12 NOTE — Addendum Note (Signed)
Addended by: Douglass Rivers T on: 02/12/2014 03:08 PM   Modules accepted: Orders

## 2014-02-12 NOTE — Telephone Encounter (Signed)
Hello ms dumas [place]. LMOVM to call back Meniscal tear noted on MRI Will have 2 choices.  1.  PT 2.  Referral for surgery to [facility] or [facility]/.

## 2014-02-12 NOTE — Telephone Encounter (Signed)
Pt requesting to speak with Amy Escobar/Dr. Tamala Julian concerning results below tried to relay msg but she has questions. Pls call...[name]

## 2014-02-12 NOTE — Telephone Encounter (Signed)
Spoke to pt. She wants to start with PT & wait on talking to a surgeon. rx of tramadol was sent to pharmacy per dr. Tamala Julian.

## 2014-02-13 ENCOUNTER — Encounter: Payer: Self-pay | Admitting: Family Medicine

## 2014-02-13 ENCOUNTER — Ambulatory Visit (INDEPENDENT_AMBULATORY_CARE_PROVIDER_SITE_OTHER): Payer: Medicare Other | Admitting: Family Medicine

## 2014-02-13 VITALS — BP 104/72 | HR 96 | Ht 64.5 in | Wt 173.0 lb

## 2014-02-13 DIAGNOSIS — M25569 Pain in unspecified knee: Secondary | ICD-10-CM

## 2014-02-13 DIAGNOSIS — M1711 Unilateral primary osteoarthritis, right knee: Secondary | ICD-10-CM

## 2014-02-13 DIAGNOSIS — S83209A Unspecified tear of unspecified meniscus, current injury, unspecified knee, initial encounter: Secondary | ICD-10-CM | POA: Insufficient documentation

## 2014-02-13 DIAGNOSIS — M25561 Pain in right knee: Secondary | ICD-10-CM

## 2014-02-13 DIAGNOSIS — M1710 Unilateral primary osteoarthritis, unspecified knee: Secondary | ICD-10-CM

## 2014-02-13 DIAGNOSIS — S83206D Unspecified tear of unspecified meniscus, current injury, right knee, subsequent encounter: Secondary | ICD-10-CM

## 2014-02-13 DIAGNOSIS — Z5189 Encounter for other specified aftercare: Secondary | ICD-10-CM

## 2014-02-13 NOTE — Progress Notes (Signed)
  [name] [place], [place] [address] Phone: [phone] Subjective:     CC: Right knee pain f/u  GYJ:Amy Escobar Amy Escobar is a 68 y.o. female coming in with complaint of right knee pain. She continued to have knee pain and had internal derangement type symptoms. Patient continued to have pain so an MRI was ordered. Patient's MRI showed a radial tear of the posterior horn of the medial meniscus with peripheral meniscal extrusion. She was also found to have mild to moderate osteoarthritic changes. We discussed the possibility of patient having a surgical intervention which patient declined at this time. Patient does have a wedding to go to in the next month and was wondering if there is any other treatment options. Patient has already failed steroid injections, physical therapy, home exercises, icing and bracing.     Past medical history, social, surgical and family history all reviewed in electronic medical record.   Review of Systems: No headache, visual changes, nausea, vomiting, diarrhea, constipation, dizziness, abdominal pain, skin rash, fevers, chills, night sweats, weight loss, swollen lymph nodes, body aches, joint swelling, muscle aches, chest pain, shortness of breath, mood changes.   Objective There were no vitals taken for this visit.  General: No apparent distress alert and oriented x3 mood and affect normal, dressed appropriately.  HEENT: Pupils equal, extraocular movements intact  Respiratory: Patient's speak in full sentences and does not appear short of breath  Cardiovascular: No lower extremity edema, non tender, no erythema  Skin: Warm dry intact with no signs of infection or rash on extremities or on axial skeleton.  Abdomen: Soft nontender  Neuro: Cranial nerves II through XII are intact, neurovascularly intact in all extremities with 2+ DTRs and 2+ pulses.  Lymph: No lymphadenopathy of posterior or anterior cervical  chain or axillae bilaterally.  Gait normal with good balance and coordination.  MSK:  Non tender with full range of motion and good stability and symmetric strength and tone of shoulders, elbows, wrist, hip, and ankles bilaterally.  Knee: Right Normal to inspection with no erythema or effusion or obvious bony abnormalities. Patient is tender to palpation mostly over the medial joint line ROM full in flexion and extension and lower leg rotation. Ligaments with solid consistent endpoints including ACL, PCL, LCL, MCL. Negative Mcmurray's, Apley's, and Thessalonian tests. Non painful patellar compression. Patellar glide without crepitus. Patellar and quadriceps tendons unremarkable. Hamstring and quadriceps strength is normal.  Contralateral knee unremarkable  After informed written and verbal consent, patient was seated on exam table. Right knee was prepped with alcohol swab and utilizing anterolateral approach, patient's right knee space was injected with16 mg/2.5 mL of Synvisc (sodium hyaluronate) in a prefilled syringe was injected easily into the knee through a 22-gauge needle.     Impression and Recommendations:     This case required medical decision making of moderate complexity.    Spent greater than 25 minutes with patient face-to-face and had greater than 50% of counseling including as described above in assessment and plan.

## 2014-02-13 NOTE — Assessment & Plan Note (Signed)
The patient will continue the bracing, home exercises, and avoid any significant twisting or locking motions. Patient does any deep squatting she will monitor closely. Patient will try these interventions and come back in one week for his second Synvisc injection.

## 2014-02-13 NOTE — Assessment & Plan Note (Signed)
referred to physical therapy. Synvisc today. Discuss continuing the bracing and a home exercises. Again in one week.

## 2014-02-13 NOTE — Assessment & Plan Note (Addendum)
This has failed all other conservative therapy and and does not want surgical intervention. Patient was given the first injection of the series of 3 injections with Synvisc. Patient will continue the other conservative therapy including home exercises, icing, as well as bracing. Patient will come back and see me in one week for the second injection in the series.

## 2014-02-13 NOTE — Patient Instructions (Signed)
Good to see you Ice in 6 hours. Ice for 20 minutes at a time.  Exercises 3 times a week.  Physical therapy will be calling you You have meniscal tear which is getting stuck from time to time between the bones.  See you next week for another injection of Synvisc.

## 2014-02-15 ENCOUNTER — Ambulatory Visit: Payer: Medicare Other | Attending: Family Medicine

## 2014-02-15 DIAGNOSIS — IMO0001 Reserved for inherently not codable concepts without codable children: Secondary | ICD-10-CM | POA: Insufficient documentation

## 2014-02-15 DIAGNOSIS — R262 Difficulty in walking, not elsewhere classified: Secondary | ICD-10-CM | POA: Insufficient documentation

## 2014-02-15 DIAGNOSIS — M25569 Pain in unspecified knee: Secondary | ICD-10-CM | POA: Insufficient documentation

## 2014-02-15 DIAGNOSIS — M25669 Stiffness of unspecified knee, not elsewhere classified: Secondary | ICD-10-CM | POA: Diagnosis not present

## 2014-02-19 ENCOUNTER — Ambulatory Visit (INDEPENDENT_AMBULATORY_CARE_PROVIDER_SITE_OTHER): Payer: Medicare Other | Admitting: Family Medicine

## 2014-02-19 ENCOUNTER — Encounter: Payer: Self-pay | Admitting: Family Medicine

## 2014-02-19 VITALS — BP 112/80 | HR 87 | Ht 64.5 in | Wt 174.0 lb

## 2014-02-19 DIAGNOSIS — M1711 Unilateral primary osteoarthritis, right knee: Secondary | ICD-10-CM

## 2014-02-19 DIAGNOSIS — M1710 Unilateral primary osteoarthritis, unspecified knee: Secondary | ICD-10-CM

## 2014-02-19 NOTE — Patient Instructions (Signed)
Good to see you.  Ice is your friend Conitnue the exercises.  See you next week for 3rd and final injection.

## 2014-02-19 NOTE — Progress Notes (Signed)
  [name] [place], [place] [address] Phone: [phone] Subjective:     CC: Right knee pain f/u  [id number] Amy Escobar is a 68 y.o. female coming in with complaint of right knee pain. She continued to have knee pain and had internal derangement type symptoms. Patient continued to have pain so an MRI was ordered. Patient's MRI showed a radial tear of the posterior horn of the medial meniscus with peripheral meniscal extrusion. She was also found to have mild to moderate osteoarthritic changes. We discussed the possibility of patient having a surgical intervention which patient declined.  Patient instead wanted to start the viscous supplementation. Patient was given injection last week. Patient was to continue home exercises and icing. Patient is going to PT. patient states that she's made approximately another 20% improvement over the course of last week.       Past medical history, social, surgical and family history all reviewed in electronic medical record.   Review of Systems: No headache, visual changes, nausea, vomiting, diarrhea, constipation, dizziness, abdominal pain, skin rash, fevers, chills, night sweats, weight loss, swollen lymph nodes, body aches, joint swelling, muscle aches, chest pain, shortness of breath, mood changes.   Objective Blood pressure 112/80, pulse 87, height 5' 4.5" (1.638 m), weight 174 lb (78.926 kg), SpO2 99.00%.  General: No apparent distress alert and oriented x3 mood and affect normal, dressed appropriately.  HEENT: Pupils equal, extraocular movements intact  Respiratory: Patient's speak in full sentences and does not appear short of breath  Cardiovascular: No lower extremity edema, non tender, no erythema  Skin: Warm dry intact with no signs of infection or rash on extremities or on axial skeleton.  Abdomen: Soft nontender  Neuro: Cranial nerves II through XII are intact, neurovascularly intact  in all extremities with 2+ DTRs and 2+ pulses.  Lymph: No lymphadenopathy of posterior or anterior cervical chain or axillae bilaterally.  Gait normal with good balance and coordination.  MSK:  Non tender with full range of motion and good stability and symmetric strength and tone of shoulders, elbows, wrist, hip, and ankles bilaterally.  Knee: Right Normal to inspection with no erythema or effusion or obvious bony abnormalities. Patient is tender to palpation mostly over the medial joint line ROM full in flexion and extension and lower leg rotation. Ligaments with solid consistent endpoints including ACL, PCL, LCL, MCL. Negative Mcmurray's, Apley's, and Thessalonian tests. Non painful patellar compression. Patellar glide without crepitus. Patellar and quadriceps tendons unremarkable. Hamstring and quadriceps strength is normal.  Contralateral knee unremarkable  After informed written and verbal consent, patient was seated on exam table. Right knee was prepped with alcohol swab and utilizing anterolateral approach, patient's right knee space was injected with16 mg/2.5 mL of Synvisc (sodium hyaluronate) in a prefilled syringe was injected easily into the knee through a 22-gauge needle. Postinjection instructions given     Impression and Recommendations:

## 2014-02-19 NOTE — Assessment & Plan Note (Signed)
Patient was given second injection of Synvisc today. Patient will continue with the bracing, icing, home exercises as well as physical therapy. We'll see patient again in one week for her third and final Synvisc injection.

## 2014-02-21 ENCOUNTER — Ambulatory Visit: Payer: Medicare Other

## 2014-02-21 DIAGNOSIS — IMO0001 Reserved for inherently not codable concepts without codable children: Secondary | ICD-10-CM | POA: Diagnosis not present

## 2014-02-26 ENCOUNTER — Encounter: Payer: Self-pay | Admitting: Family Medicine

## 2014-02-26 ENCOUNTER — Ambulatory Visit: Payer: Medicare Other

## 2014-02-26 ENCOUNTER — Ambulatory Visit (INDEPENDENT_AMBULATORY_CARE_PROVIDER_SITE_OTHER): Payer: Medicare Other | Admitting: Family Medicine

## 2014-02-26 VITALS — BP 120/72 | HR 87 | Ht 64.5 in | Wt 174.0 lb

## 2014-02-26 DIAGNOSIS — M1711 Unilateral primary osteoarthritis, right knee: Secondary | ICD-10-CM

## 2014-02-26 DIAGNOSIS — M1710 Unilateral primary osteoarthritis, unspecified knee: Secondary | ICD-10-CM

## 2014-02-26 NOTE — Patient Instructions (Signed)
It is wonderful to see you.  Continue the brace when you need it or not at all and the home exercises.  Ice is your friend.  See you  In 1 month.

## 2014-02-26 NOTE — Progress Notes (Signed)
  [facility] [place], [place] [address] Phone: [phone] Subjective:     CC: Right knee pain f/u  [id number] Amy Escobar is a 68 y.o. female coming in with complaint of right knee pain. She continued to have knee pain and had internal derangement type symptoms. Patient continued to have pain so an MRI was ordered. Patient's MRI showed a radial tear of the posterior horn of the medial meniscus with peripheral meniscal extrusion. She was also found to have mild to moderate osteoarthritic changes. We discussed the possibility of patient having a surgical intervention which patient declined.  Patient instead wanted to start the viscous supplementation. Patient was given injection last week. Patient was to continue home exercises and icing. Patient is going to PT. patient states that she's made approximately another 20% improvement over the course of last couple weeks. Patient was also given the second injection of Synvisc last week. Patient states she is estimated that 20% improvement she was a. Patient has not angulated in with a cane as much. Patient is actually wearing the brace less and less as well.     Past medical history, social, surgical and family history all reviewed in electronic medical record.   Review of Systems: No headache, visual changes, nausea, vomiting, diarrhea, constipation, dizziness, abdominal pain, skin rash, fevers, chills, night sweats, weight loss, swollen lymph nodes, body aches, joint swelling, muscle aches, chest pain, shortness of breath, mood changes.   Objective Blood pressure 120/72, pulse 87, height 5' 4.5" (1.638 m), weight 174 lb (78.926 kg), SpO2 98.00%.  General: No apparent distress alert and oriented x3 mood and affect normal, dressed appropriately.  HEENT: Pupils equal, extraocular movements intact  Respiratory: Patient's speak in full sentences and does not appear short of breath  Cardiovascular:  No lower extremity edema, non tender, no erythema  Skin: Warm dry intact with no signs of infection or rash on extremities or on axial skeleton.  Abdomen: Soft nontender  Neuro: Cranial nerves II through XII are intact, neurovascularly intact in all extremities with 2+ DTRs and 2+ pulses.  Lymph: No lymphadenopathy of posterior or anterior cervical chain or axillae bilaterally.  Gait normal with good balance and coordination.  MSK:  Non tender with full range of motion and good stability and symmetric strength and tone of shoulders, elbows, wrist, hip, and ankles bilaterally.  Knee: Right Normal to inspection with no erythema or effusion or obvious bony abnormalities. Patient is tender to palpation mostly over the medial joint line but improved from previous exam ROM full in flexion and extension and lower leg rotation. Ligaments with solid consistent endpoints including ACL, PCL, LCL, MCL. Negative Mcmurray's, Apley's, and Thessalonian tests. Non painful patellar compression. Patellar glide without crepitus. Patellar and quadriceps tendons unremarkable. Hamstring and quadriceps strength is normal.  Contralateral knee unremarkable  After informed written and verbal consent, patient was seated on exam table. Right knee was prepped with alcohol swab and utilizing anterolateral approach, patient's right knee space was injected with16 mg/2.5 mL of Synvisc (sodium hyaluronate) in a prefilled syringe was injected easily into the knee through a 22-gauge needle. Postinjection instructions given     Impression and Recommendations:

## 2014-02-26 NOTE — Assessment & Plan Note (Signed)
Patient is now finished her series of Synvisc injections. Patient can continue conservative therapy with home exercises, ice and bracing as needed and finish were start formal physical therapy. Patient and will followup with me again in 3-4 weeks. Hopefully at that time patient will show significant improvement.

## 2014-02-27 ENCOUNTER — Telehealth: Payer: Self-pay | Admitting: *Deleted

## 2014-02-27 ENCOUNTER — Other Ambulatory Visit: Payer: Self-pay | Admitting: Endocrinology

## 2014-02-27 MED ORDER — TRAMADOL HCL 50 MG PO TABS: 50.0000 mg | ORAL_TABLET | Freq: Three times a day (TID) | ORAL | Status: DC | PRN

## 2014-02-27 NOTE — Telephone Encounter (Signed)
Refill sent into pharmacy for tramadol.

## 2014-02-28 ENCOUNTER — Ambulatory Visit: Payer: Medicare Other | Attending: Family Medicine | Admitting: Rehabilitation

## 2014-02-28 DIAGNOSIS — M25569 Pain in unspecified knee: Secondary | ICD-10-CM | POA: Insufficient documentation

## 2014-02-28 DIAGNOSIS — M25669 Stiffness of unspecified knee, not elsewhere classified: Secondary | ICD-10-CM | POA: Diagnosis not present

## 2014-02-28 DIAGNOSIS — R262 Difficulty in walking, not elsewhere classified: Secondary | ICD-10-CM | POA: Insufficient documentation

## 2014-02-28 DIAGNOSIS — IMO0001 Reserved for inherently not codable concepts without codable children: Secondary | ICD-10-CM | POA: Diagnosis not present

## 2014-02-28 NOTE — Telephone Encounter (Signed)
Please advise if ok to refill, medication not on current list, Thanks!

## 2014-03-06 ENCOUNTER — Encounter: Payer: Medicare Other | Admitting: Physical Therapy

## 2014-03-08 ENCOUNTER — Encounter: Payer: Medicare Other | Admitting: Rehabilitation

## 2014-03-12 ENCOUNTER — Ambulatory Visit: Payer: Medicare Other | Admitting: Physical Therapy

## 2014-03-12 DIAGNOSIS — IMO0001 Reserved for inherently not codable concepts without codable children: Secondary | ICD-10-CM | POA: Diagnosis not present

## 2014-03-14 ENCOUNTER — Ambulatory Visit: Payer: Medicare Other | Admitting: Rehabilitation

## 2014-03-14 DIAGNOSIS — IMO0001 Reserved for inherently not codable concepts without codable children: Secondary | ICD-10-CM | POA: Diagnosis not present

## 2014-03-16 ENCOUNTER — Telehealth: Payer: Self-pay | Admitting: *Deleted

## 2014-03-16 MED ORDER — TRAMADOL HCL 50 MG PO TABS: 50.0000 mg | ORAL_TABLET | Freq: Three times a day (TID) | ORAL | Status: DC | PRN

## 2014-03-16 NOTE — Telephone Encounter (Signed)
Pt left msg on vmail stating that she dropped her tramadol in the toilet & is requesting a refill. Per dr. Tamala Julian OK to call in #10 of tramadol.

## 2014-03-19 ENCOUNTER — Encounter: Payer: Medicare Other | Admitting: Physical Therapy

## 2014-03-19 ENCOUNTER — Ambulatory Visit: Payer: Medicare Other | Admitting: Physical Therapy

## 2014-03-19 DIAGNOSIS — IMO0001 Reserved for inherently not codable concepts without codable children: Secondary | ICD-10-CM | POA: Diagnosis not present

## 2014-03-20 ENCOUNTER — Ambulatory Visit: Payer: Medicare Other | Admitting: Rehabilitation

## 2014-03-20 DIAGNOSIS — IMO0001 Reserved for inherently not codable concepts without codable children: Secondary | ICD-10-CM | POA: Diagnosis not present

## 2014-03-21 ENCOUNTER — Encounter: Payer: Medicare Other | Admitting: Rehabilitation

## 2014-03-26 ENCOUNTER — Ambulatory Visit (INDEPENDENT_AMBULATORY_CARE_PROVIDER_SITE_OTHER): Payer: Medicare Other | Admitting: Family Medicine

## 2014-03-26 ENCOUNTER — Encounter: Payer: Self-pay | Admitting: Family Medicine

## 2014-03-26 VITALS — BP 110/78 | HR 89 | Ht 64.5 in | Wt 173.0 lb

## 2014-03-26 DIAGNOSIS — S83206D Unspecified tear of unspecified meniscus, current injury, right knee, subsequent encounter: Secondary | ICD-10-CM

## 2014-03-26 DIAGNOSIS — Z5189 Encounter for other specified aftercare: Secondary | ICD-10-CM

## 2014-03-26 NOTE — Progress Notes (Signed)
[facility] [address] [place], [place] [address] Phone: [phone] Subjective:     CC: Right knee pain f/u  Amy Escobar is a 68 y.o. female coming in with complaint of right knee pain. She continued to have knee pain and had internal derangement type symptoms. Patient continued to have pain so an MRI was ordered. Patient's MRI showed a radial tear of the posterior horn of the medial meniscus with peripheral meniscal extrusion. She was also found to have mild to moderate osteoarthritic changes. We discussed the possibility of patient having a surgical intervention which patient declined.  Patient did have visco supplementation. Patient had 3 injections. Patient continued other conservative therapy. Patient states she is about 70% better after the discus supplementation. Patient states when she goes upstairs she still has some discomfort. Patient states that she is noted she's been able to walk more. Patient has tried she need some shoes including heels with some mild discomfort but overall continues to improve. Patient is very happy with the results. Patient though does have a wedding and wants to be able to dance. This wedding is going to be occurring in 2 weeks.     patient's last for steroid injection was 8 weeks ago.  Past medical history, social, surgical and family history all reviewed in electronic medical record.   Review of Systems: No headache, visual changes, nausea, vomiting, diarrhea, constipation, dizziness, abdominal pain, skin rash, fevers, chills, night sweats, weight loss, swollen lymph nodes, body aches, joint swelling, muscle aches, chest pain, shortness of breath, mood changes.   Objective Blood pressure 110/78, pulse 89, height 5' 4.5" (1.638 m), weight 173 lb (78.472 kg), SpO2 97.00%.  General: No apparent distress alert and oriented x3 mood and affect normal, dressed appropriately.  HEENT: Pupils equal, extraocular  movements intact  Respiratory: Patient's speak in full sentences and does not appear short of breath  Cardiovascular: No lower extremity edema, non tender, no erythema  Skin: Warm dry intact with no signs of infection or rash on extremities or on axial skeleton.  Abdomen: Soft nontender  Neuro: Cranial nerves II through XII are intact, neurovascularly intact in all extremities with 2+ DTRs and 2+ pulses.  Lymph: No lymphadenopathy of posterior or anterior cervical chain or axillae bilaterally.  Gait normal with good balance and coordination.  MSK:  Non tender with full range of motion and good stability and symmetric strength and tone of shoulders, elbows, wrist, hip, and ankles bilaterally.  Knee: Right Normal to inspection with no erythema or effusion or obvious bony abnormalities. Patient is minimally tender in this is an improvement from previous exam ROM full in flexion and extension and lower leg rotation. Ligaments with solid consistent endpoints including ACL, PCL, LCL, MCL. Negative Mcmurray's, Apley's, and Thessalonian tests. Non painful patellar compression. Patellar glide without crepitus. Patellar and quadriceps tendons unremarkable. Hamstring and quadriceps strength is normal.  Contralateral knee unremarkable  MSK US performed of: Right knee This study was ordered, performed, and interpreted by Charlann Boxer D.O.  Knee: All structures visualized. Posteromedial meniscus shows the patient does have some mild displacement the patient's tear seems to be healed at this point. No hypoechoic changes.  Anteromedial, anterolateral, , and posterolateral menisci unremarkable without tearing, fraying, effusion, or displacement. Patellar Tendon unremarkable on long and transverse views without effusion. No abnormality of prepatellar bursa. LCL and MCL unremarkable on long and transverse views. No abnormality of origin of medial or lateral head of the  gastrocnemius.  IMPRESSION:    healing posterior medial meniscal tear.    Impression and Recommendations:

## 2014-03-26 NOTE — Patient Instructions (Signed)
Good to see you.  Lets do PT for another 4 weeks.  Ic eis your friend Wear brace if you need it Continue the topical medicine if it helps.  Come back or make an appointment next week and we will do an injection if needed.

## 2014-03-26 NOTE — Assessment & Plan Note (Signed)
Patient overall is healing slowly. Patient encouraged to continue the home exercises, icing, as well as the formal physical therapy for another 4 weeks. We discussed activities to avoid at this time. Patient would like to come back in 2 weeks for further evaluation. Continued to have pain we may be able to give another prescription for steroid injection before her wedding. We'll continue to monitor closely.  Spent greater than 25 minutes with patient face-to-face and had greater than 50% of counseling including as described above in assessment and plan.

## 2014-03-27 ENCOUNTER — Ambulatory Visit: Payer: Medicare Other | Admitting: Physical Therapy

## 2014-03-27 DIAGNOSIS — IMO0001 Reserved for inherently not codable concepts without codable children: Secondary | ICD-10-CM | POA: Diagnosis not present

## 2014-04-02 ENCOUNTER — Ambulatory Visit: Payer: Medicare Other | Attending: Family Medicine

## 2014-04-02 ENCOUNTER — Encounter: Payer: Medicare Other | Admitting: Rehabilitation

## 2014-04-02 DIAGNOSIS — Z5189 Encounter for other specified aftercare: Secondary | ICD-10-CM | POA: Diagnosis present

## 2014-04-02 DIAGNOSIS — R262 Difficulty in walking, not elsewhere classified: Secondary | ICD-10-CM | POA: Insufficient documentation

## 2014-04-02 DIAGNOSIS — M25561 Pain in right knee: Secondary | ICD-10-CM | POA: Insufficient documentation

## 2014-04-02 DIAGNOSIS — M25661 Stiffness of right knee, not elsewhere classified: Secondary | ICD-10-CM | POA: Diagnosis not present

## 2014-04-03 ENCOUNTER — Ambulatory Visit (INDEPENDENT_AMBULATORY_CARE_PROVIDER_SITE_OTHER): Payer: Medicare Other | Admitting: Endocrinology

## 2014-04-03 ENCOUNTER — Telehealth: Payer: Self-pay | Admitting: Cardiology

## 2014-04-03 ENCOUNTER — Encounter: Payer: Self-pay | Admitting: Endocrinology

## 2014-04-03 VITALS — BP 118/80 | HR 81 | Temp 98.6°F | Ht 64.5 in | Wt 170.0 lb

## 2014-04-03 DIAGNOSIS — R609 Edema, unspecified: Secondary | ICD-10-CM

## 2014-04-03 DIAGNOSIS — T1490XA Injury, unspecified, initial encounter: Secondary | ICD-10-CM

## 2014-04-03 DIAGNOSIS — R0600 Dyspnea, unspecified: Secondary | ICD-10-CM

## 2014-04-03 DIAGNOSIS — E119 Type 2 diabetes mellitus without complications: Secondary | ICD-10-CM

## 2014-04-03 DIAGNOSIS — R0602 Shortness of breath: Secondary | ICD-10-CM

## 2014-04-03 LAB — BASIC METABOLIC PANEL
BUN: 9 mg/dL (ref 6–23)
CO2: 28 meq/L (ref 19–32)
CREATININE: 0.7 mg/dL (ref 0.4–1.2)
Calcium: 9.1 mg/dL (ref 8.4–10.5)
Chloride: 104 mEq/L (ref 96–112)
GFR: 100.16 mL/min (ref >60.00)
GLUCOSE: 193 mg/dL — AB (ref 70–99)
Potassium: 3.7 mEq/L (ref 3.5–5.1)
Sodium: 137 mEq/L (ref 135–145)

## 2014-04-03 LAB — CBC WITH DIFFERENTIAL/PLATELET
Basophils Absolute: 0 10*3/uL (ref 0.0–0.1)
Basophils Relative: 0.4 % (ref 0.0–3.0)
Eosinophils Absolute: 0.2 10*3/uL (ref 0.0–0.7)
Eosinophils Relative: 3.3 % (ref 0.0–5.0)
HCT: 37.7 % (ref 36.0–46.0)
Hemoglobin: 12.2 g/dL (ref 12.0–15.0)
LYMPHS PCT: 43.4 % (ref 12.0–46.0)
Lymphs Abs: 2.8 10*3/uL (ref 0.7–4.0)
MCHC: 32.5 g/dL (ref 30.0–36.0)
MCV: 86.4 fl (ref 78.0–100.0)
Monocytes Absolute: 0.5 10*3/uL (ref 0.1–1.0)
Monocytes Relative: 8.4 % (ref 3.0–12.0)
NEUTROS PCT: 44.5 % (ref 43.0–77.0)
Neutro Abs: 2.8 10*3/uL (ref 1.4–7.7)
Platelets: 190 10*3/uL (ref 150.0–400.0)
RBC: 4.36 Mil/uL (ref 3.87–5.11)
RDW: 13.6 % (ref 11.5–15.5)
WBC: 6.3 10*3/uL (ref 4.0–10.5)

## 2014-04-03 LAB — HEMOGLOBIN A1C: HEMOGLOBIN A1C: 7.2 % — AB (ref 4.6–6.5)

## 2014-04-03 NOTE — Telephone Encounter (Signed)
New message    Patient has problems with her breathing since day before yesterday.     Patient stats she has a little sob now.

## 2014-04-03 NOTE — Patient Instructions (Addendum)
blood tests are being requested for you today.  We'll contact you with results.  

## 2014-04-03 NOTE — Telephone Encounter (Signed)
Pt calling to let Dr Meda Coffee know that she injured her knee late July and has been in an immobilizer since then, and she feels that is causing her to have sob.  Pt states she had no surgical intervention for knee injury.  Pt states she goes to PT once weekly for knee injury.  Pt states the knee injury was muscular in nature.  Pt c/o becoming easily sob any time she is exerting.  Pt states she walked 3 miles a day prior to her knee injury in July, and was in "excellent shape." Pt states she feels deconditioned.  Pt denies any cp, palpitations, LEE, feeling dizzy, presyncopal, or syncopal episodes at this time.  Pt states she is just sob, and feels worse in the past week, after getting her flu/ whooping cough shot.  Pt states she does have a productive cough, with clear mucous noted.  Pt states she does smoke, but very minimal.  Pt reports current BP- 108/57 HR-87, taken this morning with her home monitor.  Pt reports her BS is 124 this morning.  Pt states she is compliant with taking all meds prescribed.  Pt states she has no pain, but occasionally when she starts coughing, she has mild cp, with no radiation.  Pt is concerned about her symptoms today, because by the end of the week she will be traveling out of town for her daughter's wedding.  Informed pt given her complaints and stable VS, she should contact her PCP now and make an appointment for today to rule out any respiratory issues.  Informed the pt that Dr Meda Coffee is out of the office today, but I will route this message to her for further review and recommendation and follow-up  thereafter.  Pt verbalized understanding and agrees with this plan.

## 2014-04-03 NOTE — Progress Notes (Signed)
Subjective:    Patient ID: Amy Escobar, female    DOB: May 25, 1946, 68 y.o.   MRN: [id number]  HPI Pt states few days of slight sob sensation in the chest, but no assoc pain.  She triad advair in the past, but pt says it caused nausea.   Past Medical History  Diagnosis Date  . Hyperlipidemia   . DM2 (diabetes mellitus, type 2)   . Hypertension   . Allergic rhinitis   . GERD (gastroesophageal reflux disease)   . Osteoporosis   . Uterine fibroid   . Alkaline phosphatase deficiency     w/u Ne  . Dyslipidemia   . Thyroid nodule     small  . Gout   . Anxiety   . Hemorrhoids     Past Surgical History  Procedure Laterality Date  . 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 Stress cardiolite  02/12/2006  . Echocardiogram (other)  01/16/2002  . Tubal ligation    . Toenail excision      History   Social History  . Marital Status: Widowed    Spouse Name: N/A    Number of Children: 2  . 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 Smoking status: Former Smoker -- 0.25 packs/day for 5 years    Types: Cigarettes    Quit date: 06/29/1968  . Smokeless tobacco: Never Used  . Alcohol Use: No  . Drug Use: No  . Sexual Activity: Not on file   Other Topics Concern  . Not on file   Social History Narrative   Widowed 2010.     Current Outpatient Prescriptions on File Prior to Visit  Medication Sig Dispense Refill  . amLODipine (NORVASC) 2.5 MG tablet Take 1 tablet (2.5 mg total) by mouth daily.  30 tablet  6  . Blood Glucose Monitoring Suppl (ONE TOUCH ULTRA MINI) W/DEVICE KIT 1 Device by Does not apply route once.  1 each  0  . Diclofenac Sodium 2 % SOLN Apply twice daily.  112 g  1  . gabapentin (NEURONTIN) 300 MG capsule One three times a day  90 capsule  3  . naproxen (NAPROSYN) 500 MG tablet TAKE 1 TABLET (500 MG TOTAL) BY MOUTH 2 (TWO) TIMES DAILY WITH A MEAL.  60 tablet  10  . NEXIUM 40 MG capsule TAKE 1  CAPSULE (40 MG TOTAL) BY MOUTH DAILY BEFORE BREAKFAST.  30 capsule  10  . ONE TOUCH ULTRA TEST test strip TEST BLOOD SUGAR THREE TIMES A DAY  100 each  2  . ONETOUCH DELICA LANCETS 55H MISC USE AS DIRECTED BY PRESCRIBER THREE TIMES A DAY  300 each  9  . simvastatin (ZOCOR) 80 MG tablet TAKE 1 TABLET BY MOUTH EVERY NIGHT AT BEDTIME  30 tablet  1  . traMADol (ULTRAM) 50 MG tablet Take 1 tablet (50 mg total) by mouth every 8 (eight) hours as needed.  10 tablet  0  . valsartan-hydrochlorothiazide (DIOVAN-HCT) 160-12.5 MG per tablet TAKE 1 TABLET BY MOUTH DAILY  30 tablet  9   No current facility-administered medications on file prior to visit.    Allergies  Allergen Reactions  . Olmesartan Medoxomil     REACTION: headache   Family History  Problem Relation Age of Onset  . Heart disease Mother   . Heart disease Maternal Grandfather   . Rectal cancer Maternal Grandfather   .  Stomach cancer Maternal Grandmother   . Heart disease Father    BP 118/80  Pulse 81  Temp(Src) 98.6 F (37 C) (Oral)  Ht 5' 4.5" (1.638 m)  Wt 170 lb (77.111 kg)  BMI 28.74 kg/m2  SpO2 98%  Review of Systems Denies wheezing, but she has a slight dry cough.    Objective:   Physical Exam VITAL SIGNS:  See vs page GENERAL: no distress LUNGS:  Clear to auscultation.    i reviewed spirometry tracing from march, 2015: minimal small airways disease.    Lab Results  Component Value Date   HGBA1C 7.2* 04/03/2014       Assessment & Plan:  Dyspnea, chronic, recurrent DM: mild exacerbation.   Patient is advised the following: Patient Instructions  blood tests are being requested for you today.  We'll contact you with results.

## 2014-04-04 MED ORDER — METFORMIN HCL ER 500 MG PO TB24: ORAL_TABLET | ORAL | Status: DC

## 2014-04-04 NOTE — Telephone Encounter (Signed)
Could you arrange for venous Duplex for tomorrow? Ideally also echo, but Duplex would be a priority. Thank you, Houston Siren

## 2014-04-05 ENCOUNTER — Ambulatory Visit (HOSPITAL_COMMUNITY): Payer: Medicare Other | Attending: Cardiology | Admitting: Radiology

## 2014-04-05 ENCOUNTER — Other Ambulatory Visit (HOSPITAL_COMMUNITY): Payer: Medicare Other

## 2014-04-05 ENCOUNTER — Telehealth: Payer: Self-pay | Admitting: *Deleted

## 2014-04-05 ENCOUNTER — Ambulatory Visit: Payer: Medicare Other | Admitting: Family Medicine

## 2014-04-05 ENCOUNTER — Encounter: Payer: Medicare Other | Admitting: Rehabilitation

## 2014-04-05 DIAGNOSIS — E119 Type 2 diabetes mellitus without complications: Secondary | ICD-10-CM | POA: Insufficient documentation

## 2014-04-05 DIAGNOSIS — R609 Edema, unspecified: Secondary | ICD-10-CM

## 2014-04-05 DIAGNOSIS — E785 Hyperlipidemia, unspecified: Secondary | ICD-10-CM | POA: Diagnosis not present

## 2014-04-05 DIAGNOSIS — M79661 Pain in right lower leg: Secondary | ICD-10-CM

## 2014-04-05 DIAGNOSIS — T1490XA Injury, unspecified, initial encounter: Secondary | ICD-10-CM

## 2014-04-05 DIAGNOSIS — M25561 Pain in right knee: Secondary | ICD-10-CM

## 2014-04-05 DIAGNOSIS — Z0289 Encounter for other administrative examinations: Secondary | ICD-10-CM

## 2014-04-05 DIAGNOSIS — R0602 Shortness of breath: Secondary | ICD-10-CM | POA: Diagnosis not present

## 2014-04-05 DIAGNOSIS — I10 Essential (primary) hypertension: Secondary | ICD-10-CM | POA: Diagnosis not present

## 2014-04-05 NOTE — Telephone Encounter (Signed)
Pt notified of normal bilateral lower extremity duplex, no DVT per Dr Meda Coffee.  Pt verbalized understanding and pleased with this news.

## 2014-04-05 NOTE — Telephone Encounter (Signed)
Message copied by Nuala Alpha on Thu Apr 05, 2014  5:58 PM ------      Message from: Dorothy Spark      Created: Thu Apr 05, 2014  3:17 PM       Please let her know            ----- Message -----         From: Jarvis Newcomer McFatter         Sent: 04/05/2014   1:49 PM           To: Dorothy Spark, MD            The bilateral lower extremity Duplex appears negative for DVT.       ------

## 2014-04-05 NOTE — Telephone Encounter (Signed)
Pt notified that per Dr Meda Coffee she needs to have a venous duplex done for today, and echo done in the near future for c/o trauma to leg, swelling and sob. Pt is scheduled for venous duplex today 10/8 at 1115.  Pt is aware of this scheduled appt.  Informed the pt when she comes in today for her venous duplex, to also stop by check out to have her echo scheduled for sometime next week.  Pt verbalized understanding, agrees with this plan, and very grateful for all the assistance provided.

## 2014-04-05 NOTE — Progress Notes (Signed)
Lower extremity venous Duplex performed.

## 2014-04-06 ENCOUNTER — Other Ambulatory Visit: Payer: Self-pay | Admitting: Endocrinology

## 2014-04-06 ENCOUNTER — Telehealth: Payer: Self-pay | Admitting: Endocrinology

## 2014-04-06 MED ORDER — GLUCOSE BLOOD VI STRP: 1.0000 | ORAL_STRIP | Freq: Every day | Status: DC

## 2014-04-06 NOTE — Telephone Encounter (Signed)
What kind of pump is pt referring to?

## 2014-04-06 NOTE — Telephone Encounter (Signed)
Requested call back to discuss.  

## 2014-04-06 NOTE — Telephone Encounter (Signed)
See below and please advise, Thanks!  

## 2014-04-06 NOTE — Telephone Encounter (Signed)
What is the follow up regarding the pt's request for a pump

## 2014-04-09 ENCOUNTER — Ambulatory Visit (INDEPENDENT_AMBULATORY_CARE_PROVIDER_SITE_OTHER): Payer: Medicare Other | Admitting: Family Medicine

## 2014-04-09 ENCOUNTER — Ambulatory Visit: Payer: Medicare Other | Admitting: Rehabilitation

## 2014-04-09 ENCOUNTER — Encounter: Payer: Self-pay | Admitting: Family Medicine

## 2014-04-09 ENCOUNTER — Other Ambulatory Visit: Payer: Self-pay | Admitting: Internal Medicine

## 2014-04-09 VITALS — BP 98/72 | HR 98 | Ht 64.5 in | Wt 169.0 lb

## 2014-04-09 DIAGNOSIS — S83206D Unspecified tear of unspecified meniscus, current injury, right knee, subsequent encounter: Secondary | ICD-10-CM

## 2014-04-09 NOTE — Assessment & Plan Note (Signed)
Patient is doing remarkably well at this time. I do think that patient does have a large tear that seems to be healing conservatively. I think there is a possibility that patient can have re\re exacerbation at some point but hopefully that this does not occur. Encourage her to watch any pivoting motion. Patient will finish with physical therapy and continue home exercises indefinitely. We discussed continued icing as needed and will followup with me again on an as-needed basis.

## 2014-04-09 NOTE — Progress Notes (Signed)
  [name] [address], [place] [address] Phone: [phone] Subjective:     CC: Right knee pain f/u  MPN:[id number] Amy Escobar is a 68 y.o. female coming in with complaint of right knee pain. She continued to have knee pain and had internal derangement type symptoms. Patient continued to have pain so an MRI was ordered. Patient's MRI showed a radial tear of the posterior horn of the medial meniscus with peripheral meniscal extrusion. She was also found to have mild to moderate osteoarthritic changes. We discussed the possibility of patient having a surgical intervention which patient declined.  Patient did have visco supplementation. Patient had 3 injections. Patient was doing significantly better and continues to do relatively well. Patient did dance at wedding and did well overall. Patient denies any swelling. Patient states as long as she continues the exercises she does well. Patient still has 2 weeks of formal physical therapy. Denies any new symptoms.    Past medical history, social, surgical and family history all reviewed in electronic medical record.   Review of Systems: No headache, visual changes, nausea, vomiting, diarrhea, constipation, dizziness, abdominal pain, skin rash, fevers, chills, night sweats, weight loss, swollen lymph nodes, body aches, joint swelling, muscle aches, chest pain, shortness of breath, mood changes.   Objective Blood pressure 98/72, pulse 98, height 5' 4.5" (1.638 m), weight 169 lb (76.658 kg), SpO2 99.00%.  General: No apparent distress alert and oriented x3 mood and affect normal, dressed appropriately.  HEENT: Pupils equal, extraocular movements intact  Respiratory: Patient's speak in full sentences and does not appear short of breath  Cardiovascular: No lower extremity edema, non tender, no erythema  Skin: Warm dry intact with no signs of infection or rash on extremities or on axial skeleton.    Abdomen: Soft nontender  Neuro: Cranial nerves II through XII are intact, neurovascularly intact in all extremities with 2+ DTRs and 2+ pulses.  Lymph: No lymphadenopathy of posterior or anterior cervical chain or axillae bilaterally.  Gait normal with good balance and coordination.  MSK:  Non tender with full range of motion and good stability and symmetric strength and tone of shoulders, elbows, wrist, hip, and ankles bilaterally.  Knee: Right Normal to inspection with no erythema or effusion or obvious bony abnormalities. Patient is minimally tender but doing well.  ROM full in flexion and extension and lower leg rotation. Ligaments with solid consistent endpoints including ACL, PCL, LCL, MCL. Negative Mcmurray's, Apley's, and Thessalonian tests. Non painful patellar compression. Patellar glide without crepitus. Patellar and quadriceps tendons unremarkable. Hamstring and quadriceps strength is normal.  Contralateral knee unremarkable     Impression and Recommendations:

## 2014-04-09 NOTE — Patient Instructions (Signed)
Good to see you Ice 20 minutes daily still at the end of the day.  You are doing great and continue what you are doing.  See m ewhen you need me, I will miss you.

## 2014-04-10 ENCOUNTER — Telehealth: Payer: Self-pay | Admitting: Cardiology

## 2014-04-10 ENCOUNTER — Ambulatory Visit (HOSPITAL_COMMUNITY): Payer: Medicare Other | Attending: Cardiology

## 2014-04-10 DIAGNOSIS — I10 Essential (primary) hypertension: Secondary | ICD-10-CM | POA: Diagnosis not present

## 2014-04-10 DIAGNOSIS — R51 Headache: Secondary | ICD-10-CM | POA: Insufficient documentation

## 2014-04-10 DIAGNOSIS — E119 Type 2 diabetes mellitus without complications: Secondary | ICD-10-CM | POA: Diagnosis not present

## 2014-04-10 DIAGNOSIS — Z87891 Personal history of nicotine dependence: Secondary | ICD-10-CM | POA: Insufficient documentation

## 2014-04-10 DIAGNOSIS — E785 Hyperlipidemia, unspecified: Secondary | ICD-10-CM | POA: Diagnosis not present

## 2014-04-10 DIAGNOSIS — R0602 Shortness of breath: Secondary | ICD-10-CM | POA: Diagnosis present

## 2014-04-10 NOTE — Telephone Encounter (Signed)
Pt calling to see if Dr Meda Coffee has read her echo from today 10/13.  Informed the pt that Dr Meda Coffee is out of town until Wednesday, but the echo hasn't even been read yet.  Informed the pt to contact us back around Wednesday or Thursday for the preliminary, and possible final interpretation.  Informed the pt that when Dr Meda Coffee reviews the echo and gives her final interpretation, I will follow-up thereafter.  Pt has no other complaints.  Pt verbalized understanding and agrees with this plan.

## 2014-04-10 NOTE — Progress Notes (Signed)
2D Echo completed. 04/10/2014

## 2014-04-10 NOTE — Telephone Encounter (Signed)
New message      Calling to give Amy Escobar some new information

## 2014-04-10 NOTE — Telephone Encounter (Signed)
Contacted pt. She states the message was about her dm meter. Pt states that she was able to pick her meter up. Pt states she does not need anythng further and will follow up in 3 months. Pt stated she is seeing a great difference with her blood sugar and wanted to let you know.

## 2014-04-11 ENCOUNTER — Encounter: Payer: Medicare Other | Admitting: Rehabilitation

## 2014-04-12 ENCOUNTER — Ambulatory Visit: Payer: Medicare Other | Admitting: Physical Therapy

## 2014-04-12 DIAGNOSIS — Z5189 Encounter for other specified aftercare: Secondary | ICD-10-CM | POA: Diagnosis not present

## 2014-04-12 NOTE — Telephone Encounter (Signed)
No DVT on venous Duplex. Echocardiogram shows improved pulmonary hypertension and normal LVEF. Overall great result.  Please let her know, KN

## 2014-04-12 NOTE — Telephone Encounter (Signed)
Pt contacted about no DVT on venous duplex, and echo shows improved pulmonary HTN and normal LVEF, with overall great results per Dr Meda Coffee.  Pt verbalized understanding and pleased with this news.

## 2014-04-17 ENCOUNTER — Ambulatory Visit: Payer: Medicare Other | Admitting: Physical Therapy

## 2014-04-17 DIAGNOSIS — Z5189 Encounter for other specified aftercare: Secondary | ICD-10-CM | POA: Diagnosis not present

## 2014-04-24 ENCOUNTER — Encounter: Payer: Medicare Other | Admitting: Rehabilitation

## 2014-04-25 ENCOUNTER — Ambulatory Visit: Payer: Medicare Other | Admitting: Rehabilitation

## 2014-04-26 ENCOUNTER — Other Ambulatory Visit: Payer: Self-pay | Admitting: Cardiology

## 2014-04-26 ENCOUNTER — Other Ambulatory Visit: Payer: Self-pay | Admitting: Endocrinology

## 2014-06-04 ENCOUNTER — Other Ambulatory Visit (HOSPITAL_COMMUNITY): Payer: Medicare Other

## 2014-06-07 ENCOUNTER — Telehealth: Payer: Self-pay | Admitting: Endocrinology

## 2014-06-07 ENCOUNTER — Ambulatory Visit (INDEPENDENT_AMBULATORY_CARE_PROVIDER_SITE_OTHER): Payer: Medicare Other | Admitting: Cardiology

## 2014-06-07 ENCOUNTER — Encounter: Payer: Self-pay | Admitting: Cardiology

## 2014-06-07 ENCOUNTER — Other Ambulatory Visit: Payer: Self-pay

## 2014-06-07 VITALS — BP 118/72 | HR 83 | Ht 64.5 in | Wt 171.0 lb

## 2014-06-07 DIAGNOSIS — R0609 Other forms of dyspnea: Secondary | ICD-10-CM

## 2014-06-07 DIAGNOSIS — I272 Pulmonary hypertension, unspecified: Secondary | ICD-10-CM

## 2014-06-07 DIAGNOSIS — I10 Essential (primary) hypertension: Secondary | ICD-10-CM

## 2014-06-07 DIAGNOSIS — I270 Primary pulmonary hypertension: Secondary | ICD-10-CM

## 2014-06-07 DIAGNOSIS — R0600 Dyspnea, unspecified: Secondary | ICD-10-CM

## 2014-06-07 MED ORDER — SIMVASTATIN 80 MG PO TABS: 80.0000 mg | ORAL_TABLET | Freq: Every day | ORAL | Status: DC

## 2014-06-07 NOTE — Telephone Encounter (Signed)
please call patient: i got your fax.  They are not asking to change meds, only if generic is ok.  i think it is fine to take the generic.

## 2014-06-07 NOTE — Progress Notes (Signed)
xxxxxxxxxxxxxxxxxxxxxxxxxxxxxxxxxxxxx   Patient ID: Bosie Helper, female   DOB: 04/08/1946, 68 y.o.   MRN: [id number]     Patient Name: Amy Escobar Date of Encounter: 06/07/2014  Primary Care Provider:  Renato Shin, MD Primary Cardiologist:  Dorothy Spark  Problem List   Past Medical History  Diagnosis Date  . Hyperlipidemia   . DM2 (diabetes mellitus, type 2)   . Hypertension   . Allergic rhinitis   . GERD (gastroesophageal reflux disease)   . Osteoporosis   . Uterine fibroid   . Alkaline phosphatase deficiency     w/u Ne  . Dyslipidemia   . Thyroid nodule     small  . Gout   . Anxiety   . Hemorrhoids    Past Surgical History  Procedure Laterality Date  . 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 Stress cardiolite  02/12/2006  . Echocardiogram (other)  01/16/2002  . Tubal ligation    . Toenail excision     Allergies  Allergies  Allergen Reactions  . Olmesartan Medoxomil     REACTION: headache    HPI  68 year old with hypertension, hyperlipidemia, and known insulin-dependent diabetes mellitus with dyspnea on exertion for which she underwent following tests:   GXT:  Has had DOE - referred here for GXT and also referred to pulmonary. States she is doing better. Trying to lose weight. For PFTs later this month. Today the patient exercised on the standard Bruce protocol for a total of 2:41 minutes.  Poor exercise tolerance.  Adequate blood pressure response.  Clinically negative for chest pain. Test was stopped due to dyspnea; PVCs and over a minute run of bigeminy PVCs..  EKG negative for ischemia. No significant arrhythmia noted.   Lexiscan myoview - negative for scar or ischemia 24 Holter - episodes of nonsustained VT the longest lasting 5 beats for which she underwent Lexiscan Myoview.  Referred for a cardiology consult. The patient denies any palpitations, chest pain, lower extremity edema, orthopnea, paroxysmal nocturnal dyspnea or  prior syncope. She states that in the past she used to be quite active walking for about a mile every day but she stopped in November since then she felt quite short of breath.  She is coming after 2 months, normal stress test, echo showed mild MR, TR, RVSP 47 mmHg, she was started on 2.5 mg of amlodipine and feels significantly better. She exercises 5 x week and feels great. Mild dyspnea on moderate exertion.   06/07/2014 - patient is coming after 6 months. She feels great and denies any chest pain, she has shortness of breath on moderate exertion. She has been struggling point right knee pain because of osteoarthritis and stopped going on to the [facility] to exercise. As a consequence she gained about 2 pounds. She otherwise denies palpitations or syncope. She is compliant with her medicines.  Home Medications  Prior to Admission medications   Medication Sig Start Date End Date Taking? Authorizing Provider  Blood Glucose Monitoring Suppl (ONE TOUCH ULTRA MINI) W/DEVICE KIT 1 Device by Does not apply route once. 11/25/12  Yes Renato Shin, MD  gabapentin (NEURONTIN) 300 MG capsule One three times a day 09/12/13  Yes Tanda Rockers, MD  naproxen (NAPROSYN) 500 MG tablet Take 1 tablet (500 mg total) by mouth 2 (two) times daily with a meal. 08/10/12  Yes Renato Shin, MD  NEXIUM 40 MG capsule TAKE 1 CAPSULE (40 MG TOTAL) BY MOUTH DAILY BEFORE BREAKFAST.   Yes  Renato Shin, MD  ONE TOUCH ULTRA TEST test strip TEST BLOOD SUGAR THREE(3) TIMES DAILY 06/24/13  Yes Renato Shin, MD  [facility] DELICA LANCETS 99M [place] USE AS DIRECTED BY PRESCRIBER THREE TIMES A DAY   Yes Renato Shin, MD  simvastatin (ZOCOR) 80 MG tablet TAKE 1 TABLET BY MOUTH EVERY NIGHT AT BEDTIME   Yes Renato Shin, MD  valsartan-hydrochlorothiazide (DIOVAN-HCT) 160-12.5 MG per tablet TAKE 1 TABLET BY MOUTH DAILY 07/29/13  Yes Renato Shin, MD    Family History  Family History  Problem Relation Age of Onset  . Heart disease Mother   . Heart  disease Maternal Grandfather   . Rectal cancer Maternal Grandfather   . Stomach cancer Maternal Grandmother   . Heart disease Father     Social History  History   Social History  . Marital Status: Widowed    Spouse Name: N/A    Number of Children: 2  . 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 Smoking status: Former Smoker -- 0.25 packs/day for 5 years    Types: Cigarettes    Quit date: 06/29/1968  . Smokeless tobacco: Never Used  . Alcohol Use: No  . Drug Use: No  . Sexual Activity: Not on file   Other Topics Concern  . Not on file   Social History Narrative   Widowed 2010.     Review of Systems, as per HPI, otherwise negative General:  No chills, fever, night sweats or weight changes.  Cardiovascular:  No chest pain, dyspnea on exertion, edema, orthopnea, palpitations, paroxysmal nocturnal dyspnea. Dermatological: No rash, lesions/masses Respiratory: No cough, dyspnea Urologic: No hematuria, dysuria Abdominal:   No nausea, vomiting, diarrhea, bright red blood per rectum, melena, or hematemesis Neurologic:  No visual changes, wkns, changes in mental status. All other systems reviewed and are otherwise negative except as noted above.  Physical Exam  Blood pressure 118/72, pulse 83, height 5' 4.5" (1.638 m), weight 171 lb (77.565 kg).  General: Pleasant, NAD Psych: Normal affect. Neuro: Alert and oriented X 3. Moves all extremities spontaneously. HEENT: Normal  Neck: Supple without bruits or JVD. Lungs:  Resp regular and unlabored, CTA. Heart: RRR no s3, s4, or murmurs. Abdomen: Soft, non-tender, non-distended, BS + x 4.  Extremities: No clubbing, cyanosis or edema. DP/PT/Radials 2+ and equal bilaterally.  Labs:  No results for input(s): CKTOTAL, CKMB, TROPONINI in the last 72 hours. Lab Results  Component Value Date   WBC 6.3 04/03/2014   HGB 12.2 04/03/2014   HCT 37.7 04/03/2014   MCV 86.4 04/03/2014     PLT 190.0 04/03/2014    Lab Results  Component Value Date   DDIMER 0.44 08/08/2013   Invalid input(s): POCBNP    Component Value Date/Time   NA 137 04/03/2014 1114   K 3.7 04/03/2014 1114   CL 104 04/03/2014 1114   CO2 28 04/03/2014 1114   GLUCOSE 193* 04/03/2014 1114   BUN 9 04/03/2014 1114   CREATININE 0.7 04/03/2014 1114   CALCIUM 9.1 04/03/2014 1114   PROT 7.7 08/04/2013 1409   ALBUMIN 4.2 08/04/2013 1409   AST 39* 08/04/2013 1409   ALT 39* 08/04/2013 1409   ALKPHOS 116 08/04/2013 1409   BILITOT 0.7 08/04/2013 1409   GFRNONAA >60 03/09/2010 0913   GFRAA  03/09/2010 0913    >60        The eGFR has been calculated using the MDRD equation.  This calculation has not been validated in all clinical situations. eGFR's persistently <60 mL/min signify possible Chronic Kidney Disease.   Lab Results  Component Value Date   CHOL 173 08/04/2013   HDL 44.00 08/04/2013   LDLCALC 102* 08/04/2013   TRIG 137.0 08/04/2013   Accessory Clinical Findings  Echocardiogram - 09/20/2013 - Left ventricle: The cavity size was normal. Wall thickness was normal. Systolic function was low normal to mildly reduced. The estimated ejection fraction was in the range of 50% to 55%. Wall motion was normal; there were no regional wall motion abnormalities. Doppler parameters are consistent with abnormal left ventricular relaxation (grade 1 diastolic dysfunction). - Aortic valve: There was no stenosis. - Mitral valve: Mildly calcified annulus. Normal thickness leaflets . Mild regurgitation. - Left atrium: The atrium was mildly dilated. - Right ventricle: The cavity size was normal. Systolic function was normal. - Tricuspid valve: Peak RV-RA gradient: 39m Hg (S). - Pulmonary arteries: PA peak pressure: 456mHg (S). - Systemic veins: IVC not visualized. Impressions:  - Normal LV size with low normal to mildly reduced systolic function, EF 5016-10%Normal RV size and systolic  function. Mild MR. Mild pulmonary hypertension.  04/10/2014 Left ventricle: The cavity size was normal. Wall thickness was increased in a pattern of mild LVH. Systolic function was normal. The estimated ejection fraction was in the range of 55% to 60%. Wall motion was normal; there were no regional wall motion abnormalities. Doppler parameters are consistent with abnormal left ventricular relaxation (grade 1 diastolic dysfunction). - Pulmonary arteries: Systolic pressure was mildly increased. PA peak pressure: 36 mm Hg (S).  Impressions:  - Normal LV function; mild LVH; grade 1 diastolic dysfunction; mild TR; mildly elevated pulmonary pressure. Compared to 09/20/13, LV function remains normal.   Lexiscan Myoview: 09/21/2013 Impression  Exercise Capacity: Lexiscan with low level exercise.  BP Response: Normal blood pressure response.  Clinical Symptoms: No symptoms.  ECG Impression: There are scattered PVCs.  Comparison with Prior Nuclear Study: No previous nuclear study performed  Overall Impression: Low risk stress nuclear study with apical thinning defect and small area of inferolateral bowel artifact.  LV Ejection Fraction: 53%. LV Wall Motion: NL LV Function; NL Wall Motion  ECG - sinus rhythm, normal EKG.    Assessment & Plan  A pleasant [age]ear old female who underwent extensive workup for evaluation of dyspnea on exertion. A nuclear stress test showed normal ejection fraction no prior infarct and no ischemia. Normal ECG.  1.Echocardiogram showed preserved LV ejection fraction, grade 1 diastolic dysfunction and mild pulmonary hypertension with PA pressure 47 mmHg - she was started on amlodipine with significant symptoms improvement and decrease of her RVSP to 36 mmHg.  2. Dyspnea on exertion- due to pulmonary hypertension, normal stress test.  3. Hypertension - controlled.  4. Hyperlipidemia - followed by primary care physician.   Follow up in 6  months.    NEDorothy SparkMD, FASt Elizabeths Medical Center2/03/2014, 9:16 AM

## 2014-06-07 NOTE — Patient Instructions (Signed)
Your physician recommends that you continue on your current medications as directed. Please refer to the Current Medication list given to you today.  Your physician wants you to follow-up in: 6 months. You will receive a reminder letter in the mail two months in advance. If you don't receive a letter, please call our office to schedule the follow-up appointment.  

## 2014-06-08 NOTE — Telephone Encounter (Signed)
Pt advised of note below and she states that she is ok with trying the generic of Nexium.

## 2014-06-11 ENCOUNTER — Encounter: Payer: Self-pay | Admitting: *Deleted

## 2014-06-26 LAB — HM DIABETES EYE EXAM

## 2014-06-29 HISTORY — PX: CATARACT EXTRACTION: SUR2

## 2014-06-30 ENCOUNTER — Other Ambulatory Visit: Payer: Self-pay | Admitting: Cardiology

## 2014-06-30 ENCOUNTER — Other Ambulatory Visit: Payer: Self-pay | Admitting: Internal Medicine

## 2014-07-16 ENCOUNTER — Other Ambulatory Visit: Payer: Self-pay | Admitting: *Deleted

## 2014-07-16 MED ORDER — TRAMADOL HCL 50 MG PO TABS: 50.0000 mg | ORAL_TABLET | Freq: Three times a day (TID) | ORAL | Status: DC | PRN

## 2014-07-16 NOTE — Telephone Encounter (Signed)
Refill done.  

## 2014-07-24 ENCOUNTER — Encounter: Payer: Self-pay | Admitting: Endocrinology

## 2014-07-26 ENCOUNTER — Other Ambulatory Visit: Payer: Self-pay

## 2014-07-26 DIAGNOSIS — Z1231 Encounter for screening mammogram for malignant neoplasm of breast: Secondary | ICD-10-CM

## 2014-07-31 ENCOUNTER — Other Ambulatory Visit: Payer: Self-pay | Admitting: Endocrinology

## 2014-08-01 ENCOUNTER — Other Ambulatory Visit: Payer: Self-pay

## 2014-08-01 MED ORDER — ESOMEPRAZOLE MAGNESIUM 40 MG PO CPDR: DELAYED_RELEASE_CAPSULE | ORAL | Status: DC

## 2014-08-01 MED ORDER — VALSARTAN-HYDROCHLOROTHIAZIDE 160-12.5 MG PO TABS: 1.0000 | ORAL_TABLET | Freq: Every day | ORAL | Status: DC

## 2014-08-06 ENCOUNTER — Encounter: Payer: Self-pay | Admitting: Endocrinology

## 2014-08-06 ENCOUNTER — Ambulatory Visit (INDEPENDENT_AMBULATORY_CARE_PROVIDER_SITE_OTHER): Payer: Medicare Other | Admitting: Endocrinology

## 2014-08-06 VITALS — BP 118/74 | HR 87 | Temp 97.8°F | Ht 64.5 in | Wt 172.0 lb

## 2014-08-06 DIAGNOSIS — I270 Primary pulmonary hypertension: Secondary | ICD-10-CM

## 2014-08-06 DIAGNOSIS — Z0000 Encounter for general adult medical examination without abnormal findings: Secondary | ICD-10-CM

## 2014-08-06 DIAGNOSIS — K769 Liver disease, unspecified: Secondary | ICD-10-CM

## 2014-08-06 DIAGNOSIS — M810 Age-related osteoporosis without current pathological fracture: Secondary | ICD-10-CM

## 2014-08-06 DIAGNOSIS — E119 Type 2 diabetes mellitus without complications: Secondary | ICD-10-CM | POA: Diagnosis not present

## 2014-08-06 DIAGNOSIS — E780 Pure hypercholesterolemia: Secondary | ICD-10-CM | POA: Diagnosis not present

## 2014-08-06 DIAGNOSIS — I272 Pulmonary hypertension, unspecified: Secondary | ICD-10-CM

## 2014-08-06 DIAGNOSIS — Z23 Encounter for immunization: Secondary | ICD-10-CM

## 2014-08-06 DIAGNOSIS — M109 Gout, unspecified: Secondary | ICD-10-CM | POA: Diagnosis not present

## 2014-08-06 LAB — HEPATIC FUNCTION PANEL
ALT: 33 U/L (ref 0–35)
AST: 33 U/L (ref 0–37)
Albumin: 4 g/dL (ref 3.5–5.2)
Alkaline Phosphatase: 109 U/L (ref 39–117)
BILIRUBIN DIRECT: 0.1 mg/dL (ref 0.0–0.3)
TOTAL PROTEIN: 7.1 g/dL (ref 6.0–8.3)
Total Bilirubin: 0.3 mg/dL (ref 0.2–1.2)

## 2014-08-06 LAB — LIPID PANEL
CHOL/HDL RATIO: 3
Cholesterol: 112 mg/dL (ref 0–200)
HDL: 35.7 mg/dL — ABNORMAL LOW (ref >39.00)
LDL CALC: 54 mg/dL (ref 0–99)
NONHDL: 76.3
TRIGLYCERIDES: 112 mg/dL (ref 0.0–149.0)
VLDL: 22.4 mg/dL (ref 0.0–40.0)

## 2014-08-06 LAB — CBC WITH DIFFERENTIAL/PLATELET
Basophils Absolute: 0 10*3/uL (ref 0.0–0.1)
Basophils Relative: 0.5 % (ref 0.0–3.0)
Eosinophils Absolute: 0.4 10*3/uL (ref 0.0–0.7)
Eosinophils Relative: 6 % — ABNORMAL HIGH (ref 0.0–5.0)
HEMATOCRIT: 37.4 % (ref 36.0–46.0)
Hemoglobin: 12.3 g/dL (ref 12.0–15.0)
Lymphocytes Relative: 35.9 % (ref 12.0–46.0)
Lymphs Abs: 2.3 10*3/uL (ref 0.7–4.0)
MCHC: 32.9 g/dL (ref 30.0–36.0)
MCV: 85.1 fl (ref 78.0–100.0)
MONO ABS: 0.8 10*3/uL (ref 0.1–1.0)
MONOS PCT: 11.9 % (ref 3.0–12.0)
NEUTROS PCT: 45.7 % (ref 43.0–77.0)
Neutro Abs: 2.9 10*3/uL (ref 1.4–7.7)
Platelets: 203 10*3/uL (ref 150.0–400.0)
RBC: 4.4 Mil/uL (ref 3.87–5.11)
RDW: 13.9 % (ref 11.5–15.5)
WBC: 6.3 10*3/uL (ref 4.0–10.5)

## 2014-08-06 LAB — BASIC METABOLIC PANEL
BUN: 10 mg/dL (ref 6–23)
CALCIUM: 9.5 mg/dL (ref 8.4–10.5)
CO2: 27 mEq/L (ref 19–32)
Chloride: 102 mEq/L (ref 96–112)
Creatinine, Ser: 0.72 mg/dL (ref 0.40–1.20)
GFR: 103.28 mL/min (ref >60.00)
Glucose, Bld: 106 mg/dL — ABNORMAL HIGH (ref 70–99)
POTASSIUM: 4 meq/L (ref 3.5–5.1)
Sodium: 139 mEq/L (ref 135–145)

## 2014-08-06 LAB — URINALYSIS, ROUTINE W REFLEX MICROSCOPIC
Bilirubin Urine: NEGATIVE
Hgb urine dipstick: NEGATIVE
Ketones, ur: NEGATIVE
NITRITE: NEGATIVE
RBC / HPF: NONE SEEN (ref 0–?)
SPECIFIC GRAVITY, URINE: 1.01 (ref 1.000–1.030)
Total Protein, Urine: NEGATIVE
Urine Glucose: NEGATIVE
Urobilinogen, UA: 0.2 (ref 0.0–1.0)
pH: 6 (ref 5.0–8.0)

## 2014-08-06 LAB — MICROALBUMIN / CREATININE URINE RATIO
Creatinine,U: 87.6 mg/dL
MICROALB/CREAT RATIO: 0.8 mg/g (ref 0.0–30.0)
Microalb, Ur: <0.7 mg/dL (ref 0.0–1.9)

## 2014-08-06 LAB — TSH: TSH: 1.91 u[IU]/mL (ref 0.35–4.50)

## 2014-08-06 LAB — URIC ACID: URIC ACID, SERUM: 6.4 mg/dL (ref 2.4–7.0)

## 2014-08-06 LAB — HEMOGLOBIN A1C: Hgb A1c MFr Bld: 6.6 % — ABNORMAL HIGH (ref 4.6–6.5)

## 2014-08-06 MED ORDER — TRIAMCINOLONE ACETONIDE 0.1 % EX CREA: 1.0000 "application " | TOPICAL_CREAM | Freq: Four times a day (QID) | CUTANEOUS | Status: DC

## 2014-08-06 MED ORDER — GABAPENTIN 600 MG PO TABS: 600.0000 mg | ORAL_TABLET | Freq: Three times a day (TID) | ORAL | Status: DC

## 2014-08-06 NOTE — Patient Instructions (Addendum)
please consider these measures for your health:  minimize alcohol.  do not use tobacco products.  have a colonoscopy at least every 10 years from age 69.  Women should have an annual mammogram from age 85.  keep firearms safely stored.  always use seat belts.  have working smoke alarms in your home.  see an eye doctor and dentist regularly.  never drive under the influence of alcohol or drugs (including prescription drugs).   it is critically important to prevent falling down (keep floor areas well-lit, dry, and free of loose objects.  If you have a cane, walker, or wheelchair, you should use it, even for short trips around the house.  Also, try not to rush).   i have sent a prescription to your pharmacy, for a skin cream.  blood tests are being requested for you today.  We'll let you know about the results.  Please come back for a follow-up appointment in 6 months.   good diet and exercise habits significanly improve the control of your diabetes.  please let me know if you wish to be referred to a dietician.  high blood sugar is very risky to your health.  you should see an eye doctor and dentist every year.  It is very important to get all recommended vaccinations.

## 2014-08-06 NOTE — Progress Notes (Signed)
we discussed code status.  pt requests full code, but would not want to be started or maintained on artificial life-support measures if there was not a reasonable chance of recovery 

## 2014-08-06 NOTE — Progress Notes (Signed)
Subjective:    Patient ID: Amy Escobar, female    DOB: [birth year], 69 y.o.   MRN: [id number]  HPI Pt is here for regular wellness examination, and is feeling pretty well in general, and says chronic med probs are stable, except as noted below Past Medical History  Diagnosis Date  . Hyperlipidemia   . DM2 (diabetes mellitus, type 2)   . Hypertension   . Allergic rhinitis   . GERD (gastroesophageal reflux disease)   . Osteoporosis   . Uterine fibroid   . Alkaline phosphatase deficiency     w/u Ne  . Dyslipidemia   . Thyroid nodule     small  . Gout   . Anxiety   . Hemorrhoids     Past Surgical History  Procedure Laterality Date  . 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 Stress cardiolite  02/12/2006  . Echocardiogram (other)  01/16/2002  . Tubal ligation    . Toenail excision      History   Social History  . Marital Status: Widowed    Spouse Name: N/A    Number of Children: 2  . 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 Smoking status: Former Smoker -- 0.25 packs/day for 5 years    Types: Cigarettes    Quit date: 06/29/1968  . Smokeless tobacco: Never Used  . Alcohol Use: No  . Drug Use: No  . Sexual Activity: Not on file   Other Topics Concern  . Not on file   Social History Narrative   Widowed 2010.     Current Outpatient Prescriptions on File Prior to Visit  Medication Sig Dispense Refill  . amLODipine (NORVASC) 2.5 MG tablet TAKE 1 TABLET BY MOUTH DAILY 90 tablet 3  . Blood Glucose Monitoring Suppl (ONE TOUCH ULTRA MINI) W/DEVICE KIT 1 Device by Does not apply route once. 1 each 0  . esomeprazole (NEXIUM) 40 MG capsule TAKE 1 CAPSULE (40 MG TOTAL) BY MOUTH DAILY BEFORE BREAKFAST. APPOINTMENT NEEDED FOR FURTHER REFILLS 30 capsule 9  . metFORMIN (GLUCOPHAGE-XR) 500 MG 24 hr tablet 4 tabs daily 120 tablet 11  . ONE TOUCH ULTRA TEST test strip TEST BLOOD SUGAR THREE TIMES A DAY 100 each 1    . ONETOUCH DELICA LANCETS 15Z MISC USE AS DIRECTED BY PRESCRIBER THREE TIMES A DAY 300 each 9  . simvastatin (ZOCOR) 80 MG tablet TAKE 1 TABLET (80 MG TOTAL) BY MOUTH AT BEDTIME. 30 tablet 0  . traMADol (ULTRAM) 50 MG tablet Take 1 tablet (50 mg total) by mouth every 8 (eight) hours as needed. 30 tablet 0  . valsartan-hydrochlorothiazide (DIOVAN-HCT) 160-12.5 MG per tablet Take 1 tablet by mouth daily. APPOINTMENT NEEDED FOR FURTHER REFILLS 30 tablet 0   No current facility-administered medications on file prior to visit.    Allergies  Allergen Reactions  . Olmesartan Medoxomil     REACTION: headache    Family History  Problem Relation Age of Onset  . Heart disease Mother   . Heart disease Maternal Grandfather   . Rectal cancer Maternal Grandfather   . Stomach cancer Maternal Grandmother   . Heart disease Father     BP 118/74 mmHg  Pulse 87  Temp(Src) 97.8 F (36.6 C) (Oral)  Ht 5' 4.5" (1.638 m)  Wt 172 lb (78.019 kg)  BMI 29.08 kg/m2  SpO2 94%     Review of Systems  Constitutional: Negative for fever.       She has gained weight  HENT: Negative for rhinorrhea and sore throat.   Eyes: Negative for photophobia.  Respiratory: Negative for shortness of breath.   Cardiovascular: Negative for chest pain.  Gastrointestinal: Negative for anal bleeding.  Endocrine: Negative for cold intolerance.  Genitourinary: Negative for hematuria.  Musculoskeletal: Negative for back pain.  Skin: Negative for wound.  Allergic/Immunologic: Positive for environmental allergies.  Neurological: Negative for syncope and headaches.  Hematological: Does not bruise/bleed easily.  Psychiatric/Behavioral: Negative for sleep disturbance and dysphoric mood.       Objective:   Physical Exam VS: see vs page GEN: no distress HEAD: head: no deformity eyes: no periorbital swelling, no proptosis external nose and ears are normal mouth: no lesion seen NECK: supple, thyroid is not  enlarged CHEST WALL: no deformity LUNGS:  Clear to auscultation BREASTS: sees gyn CV: reg rate and rhythm, no murmur ABD: abdomen is soft, nontender.  no hepatosplenomegaly.  not distended.  no hernia.  GENITALIA/RECTAL: sees gyn MUSCULOSKELETAL: muscle bulk and strength are grossly normal.  no obvious joint swelling.  gait is normal and steady.   PULSES: no carotid bruit NEURO:  cn 2-12 grossly intact.   readily moves all 4's. SKIN:  Normal texture and temperature.  No suspicious lesion is visible.   NODES:  None palpable at the neck PSYCH: alert, well-oriented.  Does not appear anxious nor depressed.      Assessment & Plan:  Wellness visit today, with problems stable, except as noted.     SEPARATE EVALUATION FOLLOWS--EACH PROBLEM HERE IS NEW, NOT RESPONDING TO TREATMENT, OR POSES SIGNIFICANT RISK TO THE PATIENT'S HEALTH: HISTORY OF THE PRESENT ILLNESS: Pt states few years of moderate pain at the feet, and assoc burning sensation. PAST MEDICAL HISTORY reviewed and up to date today REVIEW OF SYSTEMS: She has a rash on the forearms.  Denies numbness PHYSICAL EXAMINATION: VITAL SIGNS:  See vs page GENERAL: no distress Pulses: dorsalis pedis intact bilat.   MSK: no deformity of the feet CV: no leg edema Skin:  no ulcer on the feet.  normal color and temp on the feet.  Slight dyshidrosis on the left forearm Neuro: sensation is intact to touch on the feet Ext: There is bilateral onychomycosis of the toenails.   LAB/XRAY RESULTS: TSH=normal IMPRESSION: Foot pain, worse, prob neuropathic Rash, new, prob dyshidrosis PLAN: increase gabapentin i have sent a prescription to your pharmacy, for TAC cream    Subjective:   Patient here for Medicare annual wellness visit and management of other chronic and acute problems.     Risk factors: advanced age    43 of Physicians Providing Medical Care to Patient:  See "snapshot"   Activities of Daily Living: In your present  state of health, do you have any difficulty performing the following activities?:  Preparing food and eating?: No  Bathing yourself: No  Getting dressed: No  Using the toilet:No  Moving around from place to place: No  In the past year have you fallen or had a near fall?: No    Home Safety: Has smoke detector and wears seat belts. No firearms.  Diet and Exercise  Current exercise habits: pt says good Dietary issues discussed: pt reports a healthy diet   Depression Screen  Q1: Over the past two weeks, have you felt down, depressed or hopeless? no  Q2: Over the past two weeks, have you felt little interest or pleasure in doing things?  no   The following portions of the patient's history were reviewed and updated as appropriate: allergies, current medications, past family history, past medical history, past social history, past surgical history and problem list.   Review of Systems  Denies hearing loss, and visual loss Objective:   Vision:  Sees opthalmologist Hearing: grossly normal Body mass index:  See vs page Msk: pt easily and quickly performs "get-up-and-go" from a sitting position Cognitive Impairment Assessment: cognition, memory and judgment appear normal.  remembers 1/3 at 5 minutes (? Effort).  excellent recall.  can easily read and write a sentence.  alert and oriented x 3.     Assessment:   Medicare wellness utd on preventive parameters    Plan:   During the course of the visit the patient was educated and counseled about appropriate screening and preventive services including:        Fall prevention   Screening mammography  Bone densitometry screening  Diabetes screening  Nutrition counseling   Vaccines / LABS Zostavax / Pneumococcal Vaccine  today   Patient Instructions (the written plan) was given to the patient.

## 2014-08-07 LAB — PTH, INTACT AND CALCIUM
Calcium: 9.5 mg/dL (ref 8.4–10.5)
PTH: 44 pg/mL (ref 14–64)

## 2014-08-10 ENCOUNTER — Ambulatory Visit
Admission: RE | Admit: 2014-08-10 | Discharge: 2014-08-10 | Disposition: A | Discharge status: Home / Self Care | Payer: Medicare Other | Source: Ambulatory Visit

## 2014-08-10 ENCOUNTER — Ambulatory Visit: Payer: Medicare Other | Admitting: Endocrinology

## 2014-08-10 DIAGNOSIS — Z1231 Encounter for screening mammogram for malignant neoplasm of breast: Secondary | ICD-10-CM | POA: Diagnosis not present

## 2014-09-01 ENCOUNTER — Other Ambulatory Visit: Payer: Self-pay | Admitting: Endocrinology

## 2014-09-03 ENCOUNTER — Other Ambulatory Visit: Payer: Self-pay | Admitting: *Deleted

## 2014-09-03 MED ORDER — SIMVASTATIN 80 MG PO TABS: ORAL_TABLET | ORAL | Status: DC

## 2014-09-28 ENCOUNTER — Other Ambulatory Visit: Payer: Self-pay | Admitting: Endocrinology

## 2014-10-01 ENCOUNTER — Telehealth: Payer: Self-pay | Admitting: Endocrinology

## 2014-10-01 NOTE — Telephone Encounter (Signed)
[facility] Note: Caller states is calling about Mother. Chills, fevers, Fever is 101.8. Is a diabetic. Had a flu shot earlier in the season in October of 2015. Caller states is calling about Mother. Chills, fevers, Fever is 101.8 . Is a Diabetic. Was told to see MD within 4 hrs

## 2014-10-01 NOTE — Telephone Encounter (Signed)
Contacted pt. She states the fever has broke and the only issue she is having is headaches. PT declined to make appointment at this time. Pt states if she starts to feeling bad over night she will call our office back to schedule office visit.

## 2014-10-01 NOTE — Telephone Encounter (Signed)
Urgent care today is best.  If pt declines, ov here tomorrow.

## 2014-10-01 NOTE — Telephone Encounter (Signed)
See note below from [facility].

## 2014-10-02 ENCOUNTER — Ambulatory Visit (INDEPENDENT_AMBULATORY_CARE_PROVIDER_SITE_OTHER): Payer: Medicare Other | Admitting: Endocrinology

## 2014-10-02 ENCOUNTER — Encounter: Payer: Self-pay | Admitting: Endocrinology

## 2014-10-02 VITALS — BP 114/62 | HR 84 | Temp 98.0°F | Ht 64.5 in | Wt 166.0 lb

## 2014-10-02 DIAGNOSIS — J069 Acute upper respiratory infection, unspecified: Secondary | ICD-10-CM

## 2014-10-02 MED ORDER — CEFUROXIME AXETIL 250 MG PO TABS: 250.0000 mg | ORAL_TABLET | Freq: Two times a day (BID) | ORAL | Status: AC

## 2014-10-02 NOTE — Patient Instructions (Addendum)
i have sent a prescription to your pharmacy, for the ears.   I hope you feel better soon.  If you don't feel better by next week, please call back.  Please call sooner if you get worse.

## 2014-10-02 NOTE — Progress Notes (Signed)
Subjective:    Patient ID: Amy Escobar, female    DOB: Jun 27, 1946, 69 y.o.   MRN: [id number]  HPI Pt states few days of slight headache, worst at the bilat maxillary areas, and assoc fever.  She had intermittent rigors and n/v.  She feels somewhat better yesterday and today.   Past Medical History  Diagnosis Date  . Hyperlipidemia   . DM2 (diabetes mellitus, type 2)   . Hypertension   . Allergic rhinitis   . GERD (gastroesophageal reflux disease)   . Osteoporosis   . Uterine fibroid   . Alkaline phosphatase deficiency     w/u Ne  . Dyslipidemia   . Thyroid nodule     small  . Gout   . Anxiety   . Hemorrhoids     Past Surgical History  Procedure Laterality Date  . 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 Stress cardiolite  02/12/2006  . Echocardiogram (other)  01/16/2002  . Tubal ligation    . Toenail excision      History   Social History  . Marital Status: Widowed    Spouse Name: N/A  . Number of Children: 2  . 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 Smoking status: Former Smoker -- 0.25 packs/day for 5 years    Types: Cigarettes    Quit date: 06/29/1968  . Smokeless tobacco: Never Used  . Alcohol Use: No  . Drug Use: No  . Sexual Activity: Not on file   Other Topics Concern  . Not on file   Social History Narrative   Widowed 2010.     Current Outpatient Prescriptions on File Prior to Visit  Medication Sig Dispense Refill  . amLODipine (NORVASC) 2.5 MG tablet TAKE 1 TABLET BY MOUTH DAILY 90 tablet 3  . Blood Glucose Monitoring Suppl (ONE TOUCH ULTRA MINI) W/DEVICE KIT 1 Device by Does not apply route once. 1 each 0  . esomeprazole (NEXIUM) 40 MG capsule TAKE 1 CAPSULE (40 MG TOTAL) BY MOUTH DAILY BEFORE BREAKFAST. APPOINTMENT NEEDED FOR FURTHER REFILLS 30 capsule 9  . gabapentin (NEURONTIN) 600 MG tablet Take 1 tablet (600 mg total) by mouth 3 (three) times daily. 90 tablet 11  .  metFORMIN (GLUCOPHAGE-XR) 500 MG 24 hr tablet 4 tabs daily 120 tablet 11  . ONE TOUCH ULTRA TEST test strip TEST BLOOD SUGAR THREE TIMES A DAY 100 each 1  . ONETOUCH DELICA LANCETS 85I MISC USE AS DIRECTED BY PRESCRIBER THREE TIMES A DAY 300 each 9  . simvastatin (ZOCOR) 80 MG tablet TAKE 1 TABLET (80 MG TOTAL) BY MOUTH AT BEDTIME. 30 tablet 2  . traMADol (ULTRAM) 50 MG tablet Take 1 tablet (50 mg total) by mouth every 8 (eight) hours as needed. 30 tablet 0  . triamcinolone cream (KENALOG) 0.1 % Apply 1 application topically 4 (four) times daily. As needed for rash 30 g 2  . valsartan-hydrochlorothiazide (DIOVAN-HCT) 160-12.5 MG per tablet TAKE 1 TABLET BY MOUTH DAILY. APPOINTMENT NEEDED FOR FURTHER REFILLS 30 tablet 2   No current facility-administered medications on file prior to visit.    Allergies  Allergen Reactions  . Olmesartan Medoxomil     REACTION: headache    Family History  Problem Relation Age of Onset  . Heart disease Mother   . Heart disease Maternal Grandfather   . Rectal cancer Maternal Grandfather   . Stomach cancer Maternal Grandmother   .  Heart disease Father     BP 114/62 mmHg  Pulse 84  Temp(Src) 98 F (36.7 C) (Oral)  Ht 5' 4.5" (1.638 m)  Wt 166 lb (75.297 kg)  BMI 28.06 kg/m2  SpO2 96%  Review of Systems Denies cough, diarrhea, nasal congestion, and abd pain.      Objective:   Physical Exam VITAL SIGNS:  See vs page GENERAL: no distress head: no deformity eyes: no periorbital swelling, no proptosis external nose and ears are normal mouth: no lesion seen Ears: both tm's are very red LUNGS:  Clear to auscultation        Assessment & Plan:  URI: new.   Patient is advised the following: Patient Instructions  i have sent a prescription to your pharmacy, for the ears.   I hope you feel better soon.  If you don't feel better by next week, please call back.  Please call sooner if you get worse.

## 2014-10-25 ENCOUNTER — Other Ambulatory Visit: Payer: Self-pay

## 2014-10-25 MED ORDER — GLUCOSE BLOOD VI STRP: ORAL_STRIP | Status: DC

## 2014-11-12 ENCOUNTER — Telehealth: Payer: Self-pay | Admitting: Endocrinology

## 2014-11-12 DIAGNOSIS — L03031 Cellulitis of right toe: Secondary | ICD-10-CM | POA: Diagnosis not present

## 2014-11-12 DIAGNOSIS — M79674 Pain in right toe(s): Secondary | ICD-10-CM | POA: Diagnosis not present

## 2014-11-12 NOTE — Telephone Encounter (Signed)
See note below and please advise, Thanks! 

## 2014-11-12 NOTE — Telephone Encounter (Signed)
Left voicemail advising of note below. Requested call back if patient would like to discuss.  

## 2014-11-12 NOTE — Telephone Encounter (Signed)
Patient stated that she dosen't have any energy at all and don't know why, please advise on what she should do.

## 2014-11-12 NOTE — Telephone Encounter (Signed)
There are many possible causes for this.   Sometimes, we are not able to find a cause, but i would be happy to address at [facility]

## 2014-11-15 ENCOUNTER — Encounter: Payer: Self-pay | Admitting: Gastroenterology

## 2014-11-16 ENCOUNTER — Ambulatory Visit (INDEPENDENT_AMBULATORY_CARE_PROVIDER_SITE_OTHER): Payer: Medicare Other | Admitting: Endocrinology

## 2014-11-16 ENCOUNTER — Encounter: Payer: Self-pay | Admitting: Endocrinology

## 2014-11-16 VITALS — BP 118/80 | HR 91 | Temp 98.5°F | Ht 64.5 in | Wt 170.0 lb

## 2014-11-16 DIAGNOSIS — I10 Essential (primary) hypertension: Secondary | ICD-10-CM

## 2014-11-16 DIAGNOSIS — K769 Liver disease, unspecified: Secondary | ICD-10-CM

## 2014-11-16 DIAGNOSIS — E119 Type 2 diabetes mellitus without complications: Secondary | ICD-10-CM

## 2014-11-16 LAB — CBC WITH DIFFERENTIAL/PLATELET
BASOS ABS: 0 10*3/uL (ref 0.0–0.1)
Basophils Relative: 0.3 % (ref 0.0–3.0)
EOS ABS: 0.3 10*3/uL (ref 0.0–0.7)
Eosinophils Relative: 4.6 % (ref 0.0–5.0)
HCT: 38.7 % (ref 36.0–46.0)
HEMOGLOBIN: 12.8 g/dL (ref 12.0–15.0)
LYMPHS PCT: 38.3 % (ref 12.0–46.0)
Lymphs Abs: 2.7 10*3/uL (ref 0.7–4.0)
MCHC: 33 g/dL (ref 30.0–36.0)
MCV: 83.6 fl (ref 78.0–100.0)
MONOS PCT: 9.7 % (ref 3.0–12.0)
Monocytes Absolute: 0.7 10*3/uL (ref 0.1–1.0)
NEUTROS PCT: 47.1 % (ref 43.0–77.0)
Neutro Abs: 3.3 10*3/uL (ref 1.4–7.7)
PLATELETS: 202 10*3/uL (ref 150.0–400.0)
RBC: 4.63 Mil/uL (ref 3.87–5.11)
RDW: 14.6 % (ref 11.5–15.5)
WBC: 7 10*3/uL (ref 4.0–10.5)

## 2014-11-16 LAB — BASIC METABOLIC PANEL
BUN: 9 mg/dL (ref 6–23)
CHLORIDE: 103 meq/L (ref 96–112)
CO2: 29 mEq/L (ref 19–32)
Calcium: 9.9 mg/dL (ref 8.4–10.5)
Creatinine, Ser: 0.69 mg/dL (ref 0.40–1.20)
GFR: 108.39 mL/min (ref >60.00)
GLUCOSE: 148 mg/dL — AB (ref 70–99)
Potassium: 3.8 mEq/L (ref 3.5–5.1)
Sodium: 138 mEq/L (ref 135–145)

## 2014-11-16 LAB — HEPATIC FUNCTION PANEL
ALBUMIN: 4.1 g/dL (ref 3.5–5.2)
ALT: 35 U/L (ref 0–35)
AST: 40 U/L — AB (ref 0–37)
Alkaline Phosphatase: 106 U/L (ref 39–117)
Bilirubin, Direct: 0.1 mg/dL (ref 0.0–0.3)
TOTAL PROTEIN: 7.5 g/dL (ref 6.0–8.3)
Total Bilirubin: 0.3 mg/dL (ref 0.2–1.2)

## 2014-11-16 LAB — TSH: TSH: 1.86 u[IU]/mL (ref 0.35–4.50)

## 2014-11-16 LAB — HEMOGLOBIN A1C: HEMOGLOBIN A1C: 6.4 % (ref 4.6–6.5)

## 2014-11-16 NOTE — Patient Instructions (Addendum)
blood tests are requested for you today.  We'll let you know about the results.  

## 2014-11-16 NOTE — Progress Notes (Signed)
Subjective:    Patient ID: Amy Escobar, female    DOB: Aug 03, 1945, 69 y.o.   MRN: [id number]  HPI Pt states few weeks of severe fatigue, but no bleeding from the rectum.  She has assoc weight gain. Past Medical History  Diagnosis Date  . Hyperlipidemia   . DM2 (diabetes mellitus, type 2)   . Hypertension   . Allergic rhinitis   . GERD (gastroesophageal reflux disease)   . Osteoporosis   . Uterine fibroid   . Alkaline phosphatase deficiency     w/u Ne  . Dyslipidemia   . Thyroid nodule     small  . Gout   . Anxiety   . Hemorrhoids     Past Surgical History  Procedure Laterality Date  . 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 Stress cardiolite  02/12/2006  . Echocardiogram (other)  01/16/2002  . Tubal ligation    . Toenail excision      History   Social History  . Marital Status: Widowed    Spouse Name: N/A  . Number of Children: 2  . 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 Smoking status: Former Smoker -- 0.25 packs/day for 5 years    Types: Cigarettes    Quit date: 06/29/1968  . Smokeless tobacco: Never Used  . Alcohol Use: No  . Drug Use: No  . Sexual Activity: Not on file   Other Topics Concern  . Not on file   Social History Narrative   Widowed 2010.     Current Outpatient Prescriptions on File Prior to Visit  Medication Sig Dispense Refill  . amLODipine (NORVASC) 2.5 MG tablet TAKE 1 TABLET BY MOUTH DAILY 90 tablet 3  . Blood Glucose Monitoring Suppl (ONE TOUCH ULTRA MINI) W/DEVICE KIT 1 Device by Does not apply route once. 1 each 0  . esomeprazole (NEXIUM) 40 MG capsule TAKE 1 CAPSULE (40 MG TOTAL) BY MOUTH DAILY BEFORE BREAKFAST. APPOINTMENT NEEDED FOR FURTHER REFILLS 30 capsule 9  . gabapentin (NEURONTIN) 600 MG tablet Take 1 tablet (600 mg total) by mouth 3 (three) times daily. 90 tablet 11  . glucose blood (ONE TOUCH ULTRA TEST) test strip TEST BLOOD SUGAR THREE TIMES A DAY 200  each 2  . metFORMIN (GLUCOPHAGE-XR) 500 MG 24 hr tablet 4 tabs daily 120 tablet 11  . ONETOUCH DELICA LANCETS 66Q MISC USE AS DIRECTED BY PRESCRIBER THREE TIMES A DAY 300 each 9  . simvastatin (ZOCOR) 80 MG tablet TAKE 1 TABLET (80 MG TOTAL) BY MOUTH AT BEDTIME. 30 tablet 2  . traMADol (ULTRAM) 50 MG tablet Take 1 tablet (50 mg total) by mouth every 8 (eight) hours as needed. 30 tablet 0  . triamcinolone cream (KENALOG) 0.1 % Apply 1 application topically 4 (four) times daily. As needed for rash 30 g 2  . valsartan-hydrochlorothiazide (DIOVAN-HCT) 160-12.5 MG per tablet TAKE 1 TABLET BY MOUTH DAILY. APPOINTMENT NEEDED FOR FURTHER REFILLS 30 tablet 2   No current facility-administered medications on file prior to visit.    Allergies  Allergen Reactions  . Olmesartan Medoxomil     REACTION: headache    Family History  Problem Relation Age of Onset  . Heart disease Mother   . Heart disease Maternal Grandfather   . Rectal cancer Maternal Grandfather   . Stomach cancer Maternal Grandmother   . Heart disease Father     BP 118/80  mmHg  Pulse 91  Temp(Src) 98.5 F (36.9 C) (Oral)  Ht 5' 4.5" (1.638 m)  Wt 170 lb (77.111 kg)  BMI 28.74 kg/m2  SpO2 98%    Review of Systems Denies hematuria, insomnia, and sob    Objective:   Physical Exam VITAL SIGNS:  See vs page GENERAL: no distress LUNGS:  Clear to auscultation HEART:  Regular rate and rhythm without murmurs noted. Normal S1,S2.   Ext: no edema PSYCH: Alert and well-oriented.  Does not appear anxious nor depressed.   Lab Results  Component Value Date   HGBA1C 6.4 11/16/2014   Lab Results  Component Value Date   CREATININE 0.69 11/16/2014   BUN 9 11/16/2014   NA 138 11/16/2014   K 3.8 11/16/2014   CL 103 11/16/2014   CO2 29 11/16/2014   Lab Results  Component Value Date   WBC 7.0 11/16/2014   HGB 12.8 11/16/2014   HCT 38.7 11/16/2014   MCV 83.6 11/16/2014   PLT 202.0 11/16/2014   Lab Results  Component  Value Date   ALT 35 11/16/2014   AST 40* 11/16/2014   ALKPHOS 106 11/16/2014   BILITOT 0.3 11/16/2014        Assessment & Plan:  Fatigue: new Obesity: worse NASH: worse:    Patient is advised the following: Patient Instructions  blood tests are requested for you today.  We'll let you know about the results.    addendum: weight loss is advised--will help all 3 of the above.

## 2014-11-19 ENCOUNTER — Telehealth: Payer: Self-pay | Admitting: Endocrinology

## 2014-11-19 DIAGNOSIS — M2012 Hallux valgus (acquired), left foot: Secondary | ICD-10-CM | POA: Diagnosis not present

## 2014-11-19 DIAGNOSIS — M2011 Hallux valgus (acquired), right foot: Secondary | ICD-10-CM | POA: Diagnosis not present

## 2014-11-19 DIAGNOSIS — L03032 Cellulitis of left toe: Secondary | ICD-10-CM | POA: Diagnosis not present

## 2014-11-19 NOTE — Telephone Encounter (Signed)
Patient advised of recent lab work from 11/16/2014. Documented in result note.

## 2014-11-19 NOTE — Telephone Encounter (Signed)
Patient called and would like her lab results  ° ° °Please advise  ° ° °Thank you  °

## 2014-11-29 ENCOUNTER — Other Ambulatory Visit: Payer: Self-pay | Admitting: Endocrinology

## 2014-12-12 ENCOUNTER — Ambulatory Visit (INDEPENDENT_AMBULATORY_CARE_PROVIDER_SITE_OTHER): Payer: Medicare Other | Admitting: Cardiology

## 2014-12-12 VITALS — BP 118/64 | HR 74 | Ht 64.5 in | Wt 170.0 lb

## 2014-12-12 DIAGNOSIS — E7800 Pure hypercholesterolemia, unspecified: Secondary | ICD-10-CM

## 2014-12-12 DIAGNOSIS — I272 Pulmonary hypertension, unspecified: Secondary | ICD-10-CM

## 2014-12-12 DIAGNOSIS — I270 Primary pulmonary hypertension: Secondary | ICD-10-CM | POA: Diagnosis not present

## 2014-12-12 DIAGNOSIS — I10 Essential (primary) hypertension: Secondary | ICD-10-CM | POA: Diagnosis not present

## 2014-12-12 NOTE — Patient Instructions (Signed)
Medication Instructions:   Your physician recommends that you continue on your current medications as directed. Please refer to the Current Medication list given to you today.   Labwork:  IN 6 MONTHS PRIOR TO YOUR 6 MONTH FOLLOW-UP APPOINTMENT WITH DR NELSON--CHECK A CMET AND LIPIDS---PLEASE COME FASTING TO THIS APPOINTMENT    Follow-Up:  Your physician wants you to follow-up in: [place] will receive a reminder letter in the mail two months in advance. If you don't receive a letter, please call our office to schedule the follow-up appointment.

## 2014-12-12 NOTE — Progress Notes (Signed)
Patient ID: REDELL BHANDARI, female   DOB: [birth year], 69 y.o.   MRN: [id number]     Patient Name: Amy Escobar Date of Encounter: 12/12/2014  Primary Care Provider:  Renato Shin, MD Primary Cardiologist:  Dorothy Spark  Problem List   Past Medical History  Diagnosis Date  . Hyperlipidemia   . DM2 (diabetes mellitus, type 2)   . Hypertension   . Allergic rhinitis   . GERD (gastroesophageal reflux disease)   . Osteoporosis   . Uterine fibroid   . Alkaline phosphatase deficiency     w/u Ne  . Dyslipidemia   . Thyroid nodule     small  . Gout   . Anxiety   . Hemorrhoids    Past Surgical History  Procedure Laterality Date  . 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 Stress cardiolite  02/12/2006  . Echocardiogram (other)  01/16/2002  . Tubal ligation    . Toenail excision     Allergies  Allergies  Allergen Reactions  . Olmesartan Medoxomil     REACTION: headache    HPI  69 year old with hypertension, hyperlipidemia, and known insulin-dependent diabetes mellitus with dyspnea on exertion for which she underwent following tests:   GXT:  Has had DOE - referred here for GXT and also referred to pulmonary. States she is doing better. Trying to lose weight. For PFTs later this month. Today the patient exercised on the standard Bruce protocol for a total of 2:41 minutes.  Poor exercise tolerance.  Adequate blood pressure response.  Clinically negative for chest pain. Test was stopped due to dyspnea; PVCs and over a minute run of bigeminy PVCs..  EKG negative for ischemia. No significant arrhythmia noted.   Lexiscan myoview - negative for scar or ischemia 24 Holter - episodes of nonsustained VT the longest lasting 5 beats for which she underwent Lexiscan Myoview.  Referred for a cardiology consult. The patient denies any palpitations, chest pain, lower extremity edema, orthopnea, paroxysmal nocturnal dyspnea or prior syncope. She states that in the past she  used to be quite active walking for about a mile every day but she stopped in November since then she felt quite short of breath.  She is coming after 2 months, normal stress test, echo showed mild MR, TR, RVSP 47 mmHg, she was started on 2.5 mg of amlodipine and feels significantly better. She exercises 5 x week and feels great. Mild dyspnea on moderate exertion.   12/12/2014 - patient is coming after 6 months. She feels tired, she has family problems, stopped going to [employer] (knee sprain, toe fungal infection), she has ocasional chest pain and stable DOE, but overall feels much better than a year ago. She feels  and denies any chest pain, she feels overweight. She is complaint to her meds, denies palpitations, syncope, LE edema, orthopnea, oe PND.  Home Medications  Prior to Admission medications   Medication Sig Start Date End Date Taking? Authorizing Provider  Blood Glucose Monitoring Suppl (ONE TOUCH ULTRA MINI) W/DEVICE KIT 1 Device by Does not apply route once. 11/25/12  Yes Renato Shin, MD  gabapentin (NEURONTIN) 300 MG capsule One three times a day 09/12/13  Yes Tanda Rockers, MD  naproxen (NAPROSYN) 500 MG tablet Take 1 tablet (500 mg total) by mouth 2 (two) times daily with a meal. 08/10/12  Yes Renato Shin, MD  NEXIUM 40 MG capsule TAKE 1 CAPSULE (40 MG TOTAL) BY MOUTH DAILY BEFORE BREAKFAST.   Yes  Renato Shin, MD  ONE TOUCH ULTRA TEST test strip TEST BLOOD SUGAR THREE(3) TIMES DAILY 06/24/13  Yes Renato Shin, MD  [facility] DELICA LANCETS [address] [place] USE AS DIRECTED BY PRESCRIBER THREE TIMES A DAY   Yes Renato Shin, MD  simvastatin (ZOCOR) 80 MG tablet TAKE 1 TABLET BY MOUTH EVERY NIGHT AT BEDTIME   Yes Renato Shin, MD  valsartan-hydrochlorothiazide (DIOVAN-HCT) 160-12.5 MG per tablet TAKE 1 TABLET BY MOUTH DAILY 07/29/13  Yes Renato Shin, MD    Family History  Family History  Problem Relation Age of Onset  . Heart disease Mother   . Heart disease Maternal Grandfather     . Rectal cancer Maternal Grandfather   . Stomach cancer Maternal Grandmother   . Heart disease Father     Social History  History   Social History  . Marital Status: Widowed    Spouse Name: N/A  . Number of Children: 2  . 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 Smoking status: Former Smoker -- 0.25 packs/day for 5 years    Types: Cigarettes    Quit date: 06/29/1968  . Smokeless tobacco: Never Used  . Alcohol Use: No  . Drug Use: No  . Sexual Activity: Not on file   Other Topics Concern  . Not on file   Social History Narrative   Widowed 2010.     Review of Systems, as per HPI, otherwise negative General:  No chills, fever, night sweats or weight changes.  Cardiovascular:  No chest pain, dyspnea on exertion, edema, orthopnea, palpitations, paroxysmal nocturnal dyspnea. Dermatological: No rash, lesions/masses Respiratory: No cough, dyspnea Urologic: No hematuria, dysuria Abdominal:   No nausea, vomiting, diarrhea, bright red blood per rectum, melena, or hematemesis Neurologic:  No visual changes, wkns, changes in mental status. All other systems reviewed and are otherwise negative except as noted above.  Physical Exam  There were no vitals taken for this visit.  General: Pleasant, NAD Psych: Normal affect. Neuro: Alert and oriented X 3. Moves all extremities spontaneously. HEENT: Normal  Neck: Supple without bruits or JVD. Lungs:  Resp regular and unlabored, CTA. Heart: RRR no s3, s4, or murmurs. Abdomen: Soft, non-tender, non-distended, BS + x 4.  Extremities: No clubbing, cyanosis or edema. DP/PT/Radials 2+ and equal bilaterally.  Labs:  No results for input(s): CKTOTAL, CKMB, TROPONINI in the last 72 hours. Lab Results  Component Value Date   WBC 7.0 11/16/2014   HGB 12.8 11/16/2014   HCT 38.7 11/16/2014   MCV 83.6 11/16/2014   PLT 202.0 11/16/2014    Lab Results  Component Value Date   DDIMER  0.44 08/08/2013   Invalid input(s): POCBNP    Component Value Date/Time   NA 138 11/16/2014 1126   K 3.8 11/16/2014 1126   CL 103 11/16/2014 1126   CO2 29 11/16/2014 1126   GLUCOSE 148* 11/16/2014 1126   BUN 9 11/16/2014 1126   CREATININE 0.69 11/16/2014 1126   CALCIUM 9.9 11/16/2014 1126   PROT 7.5 11/16/2014 1126   ALBUMIN 4.1 11/16/2014 1126   AST 40* 11/16/2014 1126   ALT 35 11/16/2014 1126   ALKPHOS 106 11/16/2014 1126   BILITOT 0.3 11/16/2014 1126   GFRNONAA >60 03/09/2010 0913   GFRAA  03/09/2010 0913    >60        The eGFR has been calculated using the MDRD equation. This calculation has not been validated in all  clinical situations. eGFR's persistently <60 mL/min signify possible Chronic Kidney Disease.   Lab Results  Component Value Date   CHOL 112 08/06/2014   HDL 35.70* 08/06/2014   LDLCALC 54 08/06/2014   TRIG 112.0 08/06/2014   Accessory Clinical Findings  Echocardiogram - 09/20/2013 - Left ventricle: The cavity size was normal. Wall thickness was normal. Systolic function was low normal to mildly reduced. The estimated ejection fraction was in the range of 50% to 55%. Wall motion was normal; there were no regional wall motion abnormalities. Doppler parameters are consistent with abnormal left ventricular relaxation (grade 1 diastolic dysfunction). - Aortic valve: There was no stenosis. - Mitral valve: Mildly calcified annulus. Normal thickness leaflets . Mild regurgitation. - Left atrium: The atrium was mildly dilated. - Right ventricle: The cavity size was normal. Systolic function was normal. - Tricuspid valve: Peak RV-RA gradient: 14m Hg (S). - Pulmonary arteries: PA peak pressure: 463mHg (S). - Systemic veins: IVC not visualized. Impressions:  - Normal LV size with low normal to mildly reduced systolic function, EF 5010-21%Normal RV size and systolic function. Mild MR. Mild pulmonary hypertension.  04/10/2014 Left ventricle: The  cavity size was normal. Wall thickness was increased in a pattern of mild LVH. Systolic function was normal. The estimated ejection fraction was in the range of 55% to 60%. Wall motion was normal; there were no regional wall motion abnormalities. Doppler parameters are consistent with abnormal left ventricular relaxation (grade 1 diastolic dysfunction). - Pulmonary arteries: Systolic pressure was mildly increased. PA peak pressure: 36 mm Hg (S).  Impressions:  - Normal LV function; mild LVH; grade 1 diastolic dysfunction; mild TR; mildly elevated pulmonary pressure. Compared to 09/20/13, LV function remains normal.   Lexiscan Myoview: 09/21/2013 Impression  Exercise Capacity: Lexiscan with low level exercise.  BP Response: Normal blood pressure response.  Clinical Symptoms: No symptoms.  ECG Impression: There are scattered PVCs.  Comparison with Prior Nuclear Study: No previous nuclear study performed  Overall Impression: Low risk stress nuclear study with apical thinning defect and small area of inferolateral bowel artifact.  LV Ejection Fraction: 53%. LV Wall Motion: NL LV Function; NL Wall Motion  ECG - sinus rhythm, normal EKG.    Assessment & Plan  A pleasant 89ear old female who underwent extensive workup for evaluation of dyspnea on exertion. A nuclear stress test showed normal ejection fraction no prior infarct and no ischemia. Normal ECG.  1.Echocardiogram showed preserved LV ejection fraction, grade 1 diastolic dysfunction and mild pulmonary hypertension with PA pressure 47 mmHg - she was started on amlodipine with significant symptoms improvement and decrease of her RVSP to 36 mmHg.  2. Dyspnea on exertion- due to pulmonary hypertension, normal stress test. [facility].  3. Hypertension - controlled.  4. Hyperlipidemia - mildly elevated AST, we will repeat in 6 months, all lipids at goal.  5.  Obesity - advised on diet and exercise.   Follow up in 6 months.    NEDorothy SparkMD, FAAugusta Eye Surgery LLC/15/2016, 8:15 AM

## 2014-12-13 DIAGNOSIS — T8189XD Other complications of procedures, not elsewhere classified, subsequent encounter: Secondary | ICD-10-CM | POA: Diagnosis not present

## 2014-12-14 ENCOUNTER — Other Ambulatory Visit: Payer: Self-pay | Admitting: Endocrinology

## 2014-12-27 ENCOUNTER — Other Ambulatory Visit: Payer: Self-pay | Admitting: Endocrinology

## 2015-02-04 ENCOUNTER — Ambulatory Visit: Payer: Medicare Other | Admitting: Endocrinology

## 2015-02-08 ENCOUNTER — Encounter: Payer: Self-pay | Admitting: Endocrinology

## 2015-02-08 ENCOUNTER — Ambulatory Visit (INDEPENDENT_AMBULATORY_CARE_PROVIDER_SITE_OTHER): Payer: Medicare Other | Admitting: Endocrinology

## 2015-02-08 VITALS — BP 114/80 | HR 76 | Temp 97.7°F | Ht 64.5 in | Wt 171.0 lb

## 2015-02-08 DIAGNOSIS — E119 Type 2 diabetes mellitus without complications: Secondary | ICD-10-CM

## 2015-02-08 LAB — POCT GLYCOSYLATED HEMOGLOBIN (HGB A1C): Hemoglobin A1C: 7.2

## 2015-02-08 MED ORDER — PIOGLITAZONE HCL 15 MG PO TABS: 15.0000 mg | ORAL_TABLET | Freq: Every day | ORAL | Status: DC

## 2015-02-08 NOTE — Progress Notes (Signed)
 Subjective:    Patient ID: Amy Escobar, female    DOB: 01/25/1946, 69 y.o.   MRN: [id number]  HPI Pt returns for f/u of diabetes mellitus: DM type: 2 Dx'ed: 2012 Complications: none Therapy: metformin GDM: never DKA: never Severe hypoglycemia: never Pancreatitis: never Other: she has never been on insulin.   Interval history: pt states she feels well in general.  She takes metformin as rx'ed.   Past Medical History  Diagnosis Date  . Hyperlipidemia   . DM2 (diabetes mellitus, type 2)   . Hypertension   . Allergic rhinitis   . GERD (gastroesophageal reflux disease)   . Osteoporosis   . Uterine fibroid   . Alkaline phosphatase deficiency     w/u Ne  . Dyslipidemia   . Thyroid nodule     small  . Gout   . Anxiety   . Hemorrhoids     Past Surgical History  Procedure Laterality Date  . 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 Stress cardiolite  02/12/2006  . Echocardiogram (other)  01/16/2002  . Tubal ligation    . Toenail excision      Social History   Social History  . Marital Status: Widowed    Spouse Name: N/A  . Number of Children: 2  . 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 Smoking status: Former Smoker -- 0.25 packs/day for 5 years    Types: Cigarettes    Quit date: 06/29/1968  . Smokeless tobacco: Never Used  . Alcohol Use: No  . Drug Use: No  . Sexual Activity: Not on file   Other Topics Concern  . Not on file   Social History Narrative   Widowed 2010.     Current Outpatient Prescriptions on File Prior to Visit  Medication Sig Dispense Refill  . amLODipine (NORVASC) 2.5 MG tablet TAKE 1 TABLET BY MOUTH DAILY 90 tablet 3  . Blood Glucose Monitoring Suppl (ONE TOUCH ULTRA MINI) W/DEVICE KIT 1 Device by Does not apply route once. 1 each 0  . esomeprazole (NEXIUM) 40 MG capsule TAKE 1 CAPSULE (40 MG TOTAL) BY MOUTH DAILY BEFORE BREAKFAST. APPOINTMENT NEEDED FOR FURTHER REFILLS 30  capsule 9  . gabapentin (NEURONTIN) 600 MG tablet Take 1 tablet (600 mg total) by mouth 3 (three) times daily. 90 tablet 11  . glucose blood (ONE TOUCH ULTRA TEST) test strip TEST BLOOD SUGAR THREE TIMES A DAY 200 each 2  . metFORMIN (GLUCOPHAGE-XR) 500 MG 24 hr tablet 4 tabs daily 120 tablet 11  . ONETOUCH DELICA LANCETS 33G MISC USE AS DIRECTED BY PRESCRIBER THREE TIMES A DAY 300 each 9  . simvastatin (ZOCOR) 80 MG tablet TAKE 1 TABLET (80 MG TOTAL) BY MOUTH AT BEDTIME. 30 tablet 1  . traMADol (ULTRAM) 50 MG tablet Take 1 tablet (50 mg total) by mouth every 8 (eight) hours as needed. 30 tablet 0  . triamcinolone cream (KENALOG) 0.1 % APPLY 1 APPLICATION TOPICALLY 4 (FOUR) TIMES DAILY AS NEEDED FOR RASH 30 g 1  . valsartan-hydrochlorothiazide (DIOVAN-HCT) 160-12.5 MG per tablet TAKE 1 TABLET BY MOUTH DAILY. APPOINTMENT NEEDED FOR FURTHER REFILLS 30 tablet 1   No current facility-administered medications on file prior to visit.    Allergies  Allergen Reactions  . Olmesartan Medoxomil     REACTION: headache    Family History  Problem Relation Age of Onset  . Heart disease Mother   .   Heart disease Maternal Grandfather   . Rectal cancer Maternal Grandfather   . Stomach cancer Maternal Grandmother   . Heart disease Father     BP 114/80 mmHg  Pulse 76  Temp(Src) 97.7 F (36.5 C) (Oral)  Ht 5' 4.5" (1.638 m)  Wt 171 lb (77.565 kg)  BMI 28.91 kg/m2  SpO2 96%  Review of Systems Denies weight change.     Objective:   Physical Exam VITAL SIGNS:  See vs page GENERAL: no distress Pulses: dorsalis pedis intact bilat.   MSK: no deformity of the feet CV: no leg edema Skin:  no ulcer on the feet.  normal color and temp on the feet. Neuro: sensation is intact to touch on the feet Ext: both great toenails are absent.     A1c=7.2%    Assessment & Plan:  DM: she needs increased rx, if it can be done with a regimen that avoids or minimizes hypoglycemia.    Patient is advised the  following: Patient Instructions  i have sent a prescription to your pharmacy, to add "pioglitizone." Call if you legs swell, as this can be a side effect.  Please come back for a follow-up appointment in 3 months     

## 2015-02-08 NOTE — Patient Instructions (Addendum)
i have sent a prescription to your pharmacy, to add "pioglitizone." Call if you legs swell, as this can be a side effect.  Please come back for a follow-up appointment in 3 months

## 2015-02-11 ENCOUNTER — Telehealth: Payer: Self-pay | Admitting: Cardiology

## 2015-02-11 ENCOUNTER — Other Ambulatory Visit: Payer: Self-pay

## 2015-02-11 MED ORDER — SIMVASTATIN 80 MG PO TABS: ORAL_TABLET | ORAL | Status: DC

## 2015-02-11 NOTE — Telephone Encounter (Signed)
Left message to call back  

## 2015-02-11 NOTE — Telephone Encounter (Signed)
Patient presents in person with question about taking Actos with her cardiac medications.  I spoke with her in the lobby and advised her that there are no known reactions between her Actos and her cardiac medications and that her PCP, Dr. Loanne Drilling is on the same EMR so he can see all the medications she is prescribed.  I advised her to follow his advice regarding the Actos.  She verbalized understanding and agreement.

## 2015-02-11 NOTE — Telephone Encounter (Signed)
New message     Pt states PCP has put her on actos 15mg  Pt calling to see if she take medication or not Please call to discuss

## 2015-02-15 ENCOUNTER — Encounter (HOSPITAL_COMMUNITY): Payer: Self-pay | Admitting: Vascular Surgery

## 2015-02-15 ENCOUNTER — Emergency Department (HOSPITAL_COMMUNITY): Payer: Medicare Other

## 2015-02-15 ENCOUNTER — Observation Stay (HOSPITAL_COMMUNITY)
Admission: EM | Admit: 2015-02-15 | Discharge: 2015-02-16 | Disposition: A | Discharge status: Home / Self Care | Payer: Medicare Other | Attending: Interventional Cardiology | Admitting: Interventional Cardiology

## 2015-02-15 DIAGNOSIS — R0609 Other forms of dyspnea: Secondary | ICD-10-CM | POA: Diagnosis present

## 2015-02-15 DIAGNOSIS — E119 Type 2 diabetes mellitus without complications: Secondary | ICD-10-CM | POA: Insufficient documentation

## 2015-02-15 DIAGNOSIS — Z7982 Long term (current) use of aspirin: Secondary | ICD-10-CM | POA: Insufficient documentation

## 2015-02-15 DIAGNOSIS — K649 Unspecified hemorrhoids: Secondary | ICD-10-CM | POA: Diagnosis not present

## 2015-02-15 DIAGNOSIS — Z86018 Personal history of other benign neoplasm: Secondary | ICD-10-CM | POA: Insufficient documentation

## 2015-02-15 DIAGNOSIS — K219 Gastro-esophageal reflux disease without esophagitis: Secondary | ICD-10-CM | POA: Insufficient documentation

## 2015-02-15 DIAGNOSIS — M810 Age-related osteoporosis without current pathological fracture: Secondary | ICD-10-CM | POA: Insufficient documentation

## 2015-02-15 DIAGNOSIS — R0600 Dyspnea, unspecified: Secondary | ICD-10-CM | POA: Diagnosis present

## 2015-02-15 DIAGNOSIS — E7800 Pure hypercholesterolemia, unspecified: Secondary | ICD-10-CM | POA: Diagnosis present

## 2015-02-15 DIAGNOSIS — R079 Chest pain, unspecified: Secondary | ICD-10-CM | POA: Diagnosis not present

## 2015-02-15 DIAGNOSIS — Z79899 Other long term (current) drug therapy: Secondary | ICD-10-CM | POA: Diagnosis not present

## 2015-02-15 DIAGNOSIS — Z87891 Personal history of nicotine dependence: Secondary | ICD-10-CM | POA: Insufficient documentation

## 2015-02-15 DIAGNOSIS — R0789 Other chest pain: Secondary | ICD-10-CM

## 2015-02-15 DIAGNOSIS — I10 Essential (primary) hypertension: Secondary | ICD-10-CM | POA: Diagnosis not present

## 2015-02-15 DIAGNOSIS — M109 Gout, unspecified: Secondary | ICD-10-CM | POA: Diagnosis not present

## 2015-02-15 DIAGNOSIS — E118 Type 2 diabetes mellitus with unspecified complications: Secondary | ICD-10-CM

## 2015-02-15 DIAGNOSIS — I272 Pulmonary hypertension, unspecified: Secondary | ICD-10-CM | POA: Diagnosis present

## 2015-02-15 DIAGNOSIS — E785 Hyperlipidemia, unspecified: Secondary | ICD-10-CM | POA: Diagnosis not present

## 2015-02-15 DIAGNOSIS — F419 Anxiety disorder, unspecified: Secondary | ICD-10-CM | POA: Insufficient documentation

## 2015-02-15 DIAGNOSIS — R0602 Shortness of breath: Secondary | ICD-10-CM | POA: Diagnosis not present

## 2015-02-15 LAB — BASIC METABOLIC PANEL
ANION GAP: 9 (ref 5–15)
BUN: 11 mg/dL (ref 6–20)
CO2: 25 mmol/L (ref 22–32)
Calcium: 9.2 mg/dL (ref 8.9–10.3)
Chloride: 101 mmol/L (ref 101–111)
Creatinine, Ser: 0.85 mg/dL (ref 0.44–1.00)
GFR calc Af Amer: >60 mL/min (ref >60)
GLUCOSE: 99 mg/dL (ref 65–99)
Potassium: 4 mmol/L (ref 3.5–5.1)
Sodium: 135 mmol/L (ref 135–145)

## 2015-02-15 LAB — CBC
HEMATOCRIT: 40.4 % (ref 36.0–46.0)
HEMOGLOBIN: 13.1 g/dL (ref 12.0–15.0)
MCH: 28.3 pg (ref 26.0–34.0)
MCHC: 32.4 g/dL (ref 30.0–36.0)
MCV: 87.3 fL (ref 78.0–100.0)
Platelets: 191 10*3/uL (ref 150–400)
RBC: 4.63 MIL/uL (ref 3.87–5.11)
RDW: 14 % (ref 11.5–15.5)
WBC: 6.5 10*3/uL (ref 4.0–10.5)

## 2015-02-15 LAB — TROPONIN I: Troponin I: <0.03 ng/mL (ref <0.031)

## 2015-02-15 LAB — D-DIMER, QUANTITATIVE: D-Dimer, Quant: <0.27 ug/mL-FEU (ref 0.00–0.48)

## 2015-02-15 LAB — CBG MONITORING, ED: Glucose-Capillary: 92 mg/dL (ref 65–99)

## 2015-02-15 LAB — I-STAT TROPONIN, ED
TROPONIN I, POC: 0 ng/mL (ref 0.00–0.08)
Troponin i, poc: 0 ng/mL (ref 0.00–0.08)

## 2015-02-15 LAB — GLUCOSE, CAPILLARY: Glucose-Capillary: 152 mg/dL — ABNORMAL HIGH (ref 65–99)

## 2015-02-15 MED ORDER — ASPIRIN EC 81 MG PO TBEC
81.0000 mg | DELAYED_RELEASE_TABLET | Freq: Every day | ORAL | Status: DC
  Administered 2015-02-16: 81 mg via ORAL
  Filled 2015-02-15: qty 1

## 2015-02-15 MED ORDER — ONDANSETRON HCL 4 MG/2ML IJ SOLN: 4.0000 mg | Freq: Four times a day (QID) | INTRAMUSCULAR | Status: DC | PRN

## 2015-02-15 MED ORDER — ASPIRIN 81 MG PO CHEW
324.0000 mg | CHEWABLE_TABLET | ORAL | Status: AC
  Administered 2015-02-15: 243 mg via ORAL
  Filled 2015-02-15: qty 4

## 2015-02-15 MED ORDER — PANTOPRAZOLE SODIUM 40 MG PO TBEC
40.0000 mg | DELAYED_RELEASE_TABLET | Freq: Every day | ORAL | Status: DC
  Administered 2015-02-16: 40 mg via ORAL
  Filled 2015-02-15: qty 1

## 2015-02-15 MED ORDER — VALSARTAN-HYDROCHLOROTHIAZIDE 160-12.5 MG PO TABS: 1.0000 | ORAL_TABLET | Freq: Every day | ORAL | Status: DC

## 2015-02-15 MED ORDER — ATORVASTATIN CALCIUM 40 MG PO TABS
40.0000 mg | ORAL_TABLET | Freq: Every day | ORAL | Status: DC
  Administered 2015-02-16: 40 mg via ORAL
  Filled 2015-02-15: qty 1

## 2015-02-15 MED ORDER — AMLODIPINE BESYLATE 2.5 MG PO TABS
2.5000 mg | ORAL_TABLET | Freq: Every day | ORAL | Status: DC
  Administered 2015-02-15 – 2015-02-16 (×2): 2.5 mg via ORAL
  Filled 2015-02-15 (×2): qty 1

## 2015-02-15 MED ORDER — IRBESARTAN 150 MG PO TABS
150.0000 mg | ORAL_TABLET | Freq: Every day | ORAL | Status: DC
  Administered 2015-02-16: 150 mg via ORAL
  Filled 2015-02-15: qty 1

## 2015-02-15 MED ORDER — HEPARIN SODIUM (PORCINE) 5000 UNIT/ML IJ SOLN
5000.0000 [IU] | Freq: Three times a day (TID) | INTRAMUSCULAR | Status: DC
  Administered 2015-02-15 – 2015-02-16 (×3): 5000 [IU] via SUBCUTANEOUS
  Filled 2015-02-15 (×3): qty 1

## 2015-02-15 MED ORDER — NITROGLYCERIN 0.4 MG SL SUBL: 0.4000 mg | SUBLINGUAL_TABLET | SUBLINGUAL | Status: DC | PRN

## 2015-02-15 MED ORDER — HYDROCHLOROTHIAZIDE 12.5 MG PO CAPS
12.5000 mg | ORAL_CAPSULE | Freq: Every day | ORAL | Status: DC
  Administered 2015-02-16: 12.5 mg via ORAL
  Filled 2015-02-15: qty 1

## 2015-02-15 MED ORDER — GABAPENTIN 600 MG PO TABS
600.0000 mg | ORAL_TABLET | Freq: Three times a day (TID) | ORAL | Status: DC
  Administered 2015-02-15 – 2015-02-16 (×3): 600 mg via ORAL
  Filled 2015-02-15 (×3): qty 1

## 2015-02-15 MED ORDER — PIOGLITAZONE HCL 15 MG PO TABS
15.0000 mg | ORAL_TABLET | Freq: Every day | ORAL | Status: DC
  Administered 2015-02-16: 15 mg via ORAL
  Filled 2015-02-15 (×3): qty 1

## 2015-02-15 MED ORDER — ACETAMINOPHEN 325 MG PO TABS: 650.0000 mg | ORAL_TABLET | ORAL | Status: DC | PRN

## 2015-02-15 MED ORDER — NITROGLYCERIN 0.4 MG SL SUBL
0.4000 mg | SUBLINGUAL_TABLET | Freq: Once | SUBLINGUAL | Status: AC
  Administered 2015-02-15: 0.4 mg via SUBLINGUAL
  Filled 2015-02-15: qty 1

## 2015-02-15 MED ORDER — ASPIRIN 300 MG RE SUPP: 300.0000 mg | RECTAL | Status: AC

## 2015-02-15 MED ORDER — ASPIRIN EC 81 MG PO TBEC: 81.0000 mg | DELAYED_RELEASE_TABLET | Freq: Every day | ORAL | Status: DC

## 2015-02-15 NOTE — Consult Note (Signed)
Patient ID: Amy Escobar MRN: [id number], DOB/AGE: 1945/07/16   Admit date: 02/15/2015   Primary Physician: Renato Shin, MD Primary Cardiologist: Dr. Meda Coffee  Pt. Profile:  69 y/o female with h/o HTN, HLD, DM and GERD, negative NST 08/2013, normal LV systolic function and mild PAH, presenting to the [facility] ED with chest pain.   Problem List  Past Medical History  Diagnosis Date  . Hyperlipidemia   . DM2 (diabetes mellitus, type 2)   . Hypertension   . Allergic rhinitis   . GERD (gastroesophageal reflux disease)   . Osteoporosis   . Uterine fibroid   . Alkaline phosphatase deficiency     w/u Ne  . Dyslipidemia   . Thyroid nodule     small  . Gout   . Anxiety   . Hemorrhoids     Past Surgical History  Procedure Laterality Date  . 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 Stress cardiolite  02/12/2006  . Echocardiogram (other)  01/16/2002  . Tubal ligation    . Toenail excision       Allergies  Allergies  Allergen Reactions  . Olmesartan Medoxomil     REACTION: headache    HPI  The patient is a 69 y/o female with a history of HTN, HLD, DM and GERD. She was referred to Dr. Meda Coffee in 2015 for evaluation for DOE. Subsequent Lexiscan NST 09/20/13 was negative for scar and ischemia. Her most recent 2-D echo 04/10/14 showed normal LV systolic function with an EF of 55-60%. Wall motion was normal w/o regional abnormalities. Mild LVH and G1DD was noted. Pulmonary artery systolic pressure was mildly increased with a PA pressure of 36 mm Hg. She was also noted to have mild MR but no other significant valvular abnormalities. She was recently seen by Dr. Meda Coffee in clinic on 12/12/14 and was felt to be stable from a cardiac standpoint. She was instructed to f/u in 6 months. She takes medications for her blood pressure, cholesterol and diabetes. She reports full medication compliance with all of her meds. She reports that her PCP recently added Actos to her diabetic regimen  7  days ago 02/09/2015.  She now reports to the [facility] ED with a complaint of chest pain. The onset was one day ago around 4:00 PM. She noted resting substernal/mid epigastric chest discomfort. Described as a dull ache. She denies any radiation to the left chest, upper arms, neck jaw or back. Denies any associated resting dyspnea, palpitations, nausea, vomiting, diaphoresis, syncope/near-syncope. She has noted associated mild lightheadedness and dizziness. She has had intermittent episodes of chest discomfort off and on, lasting roughly 5-10 minutes at a time.  No relationship with meals. She tried treatment with TUMS at home with minimal improvement. She denies any exertional chest discomfort.   POC troponin in the ED is negative. D-dimer is also negative excluding possibility of PE. Her EKG shows NSR w/o any acute ischemic abnormalities. CXR is unremarkable. VSS. CBC and BMP unremarkable. In the ED, she continues to have intermittent mild 3/10 substernal chest discomfort.      Home Medications  Prior to Admission medications   Medication Sig Start Date End Date Taking? Authorizing Provider  amLODipine (NORVASC) 2.5 MG tablet TAKE 1 TABLET BY MOUTH DAILY 07/03/14  Yes Dorothy Spark, MD  aspirin EC 81 MG tablet Take 81 mg by mouth daily.   Yes Historical Provider, MD  cetirizine (ZYRTEC) 10 MG tablet Take 10 mg by mouth daily.  Yes Historical Provider, MD  esomeprazole (NEXIUM) 40 MG capsule TAKE 1 CAPSULE (40 MG TOTAL) BY MOUTH DAILY BEFORE BREAKFAST. APPOINTMENT NEEDED FOR FURTHER REFILLS 08/01/14  Yes Renato Shin, MD  gabapentin (NEURONTIN) 600 MG tablet Take 1 tablet (600 mg total) by mouth 3 (three) times daily. 08/06/14  Yes Renato Shin, MD  metFORMIN (GLUCOPHAGE-XR) 500 MG 24 hr tablet 4 tabs daily Patient taking differently: Take 1,000 mg by mouth 2 (two) times daily. 4 tabs daily 04/04/14  Yes Renato Shin, MD  pioglitazone (ACTOS) 15 MG tablet Take 1 tablet (15 mg total) by mouth daily.  02/08/15  Yes Renato Shin, MD  simvastatin (ZOCOR) 80 MG tablet TAKE 1 TABLET (80 MG TOTAL) BY MOUTH AT BEDTIME. 02/11/15  Yes Renato Shin, MD  valsartan-hydrochlorothiazide (DIOVAN-HCT) 160-12.5 MG per tablet TAKE 1 TABLET BY MOUTH DAILY. APPOINTMENT NEEDED FOR FURTHER REFILLS 12/27/14  Yes Renato Shin, MD  Blood Glucose Monitoring Suppl (ONE TOUCH ULTRA MINI) W/DEVICE KIT 1 Device by Does not apply route once. 11/25/12   Renato Shin, MD  glucose blood (ONE TOUCH ULTRA TEST) test strip TEST BLOOD SUGAR THREE TIMES A DAY 10/25/14   Renato Shin, MD  [facility] DELICA LANCETS 16L MISC USE AS DIRECTED BY PRESCRIBER THREE TIMES A DAY    Renato Shin, MD  traMADol (ULTRAM) 50 MG tablet Take 1 tablet (50 mg total) by mouth every 8 (eight) hours as needed. Patient not taking: Reported on 02/15/2015 07/16/14   Lyndal Pulley, DO  triamcinolone cream (KENALOG) 0.1 % APPLY 1 APPLICATION TOPICALLY 4 (FOUR) TIMES DAILY AS NEEDED FOR RASH Patient not taking: Reported on 02/15/2015 11/29/14   Renato Shin, MD    Family History  Family History  Problem Relation Age of Onset  . Heart disease Mother   . Heart disease Maternal Grandfather   . Rectal cancer Maternal Grandfather   . Stomach cancer Maternal Grandmother   . Heart disease Father     Social History  Social History   Social History  . Marital Status: Widowed    Spouse Name: N/A  . Number of Children: 2  . 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 Smoking status: Former Smoker -- 0.25 packs/day for 5 years    Types: Cigarettes    Quit date: 06/29/1968  . Smokeless tobacco: Never Used  . Alcohol Use: No  . Drug Use: No  . Sexual Activity: Not on file   Other Topics Concern  . Not on file   Social History Narrative   Widowed 2010.      Review of Systems General:  No chills, fever, night sweats or weight changes.  Cardiovascular:  + for chest pain, + mild dyspnea on exertion, no  edema, orthopnea, palpitations, paroxysmal nocturnal dyspnea. Dermatological: No rash, lesions/masses Respiratory: No cough, dyspnea Urologic: No hematuria, dysuria Abdominal:   No nausea, vomiting, diarrhea, bright red blood per rectum, melena, or hematemesis Neurologic:  No visual changes, wkns, changes in mental status. All other systems reviewed and are otherwise negative except as noted above.  Physical Exam  Blood pressure 107/70, pulse 69, temperature 98 F (36.7 C), temperature source Oral, resp. rate 21, height '5\' 4"'  (1.626 m), weight 171 lb (77.565 kg), SpO2 100 %.  General: Pleasant, NAD Psych: Normal affect. Neuro: Alert and oriented X 3. Moves all extremities spontaneously. HEENT: Normal  Neck: Supple without bruits or JVD. Lungs:  Resp regular and  unlabored, CTA. Heart: RRR no s3, s4, or murmurs. Abdomen: Soft, non-tender, non-distended, BS + x 4.  Extremities: No clubbing, cyanosis or edema. DP/PT/Radials 2+ and equal bilaterally.  Labs  Troponin (Point of Care Test)  Recent Labs  02/15/15 1210  TROPIPOC 0.00   No results for input(s): CKTOTAL, CKMB, TROPONINI in the last 72 hours. Lab Results  Component Value Date   WBC 6.5 02/15/2015   HGB 13.1 02/15/2015   HCT 40.4 02/15/2015   MCV 87.3 02/15/2015   PLT 191 02/15/2015    Recent Labs Lab 02/15/15 1215  NA 135  K 4.0  CL 101  CO2 25  BUN 11  CREATININE 0.85  CALCIUM 9.2  GLUCOSE 99   Lab Results  Component Value Date   CHOL 112 08/06/2014   HDL 35.70* 08/06/2014   LDLCALC 54 08/06/2014   TRIG 112.0 08/06/2014   Lab Results  Component Value Date   DDIMER <0.27 02/15/2015     Radiology/Studies  Dg Chest 2 View  02/15/2015   CLINICAL DATA:  Chest pain, shortness of breath starting yesterday  EXAM: CHEST  2 VIEW  COMPARISON:  01/10/2014  FINDINGS: Cardiomediastinal silhouette is stable. No acute infiltrate or pleural effusion. No pulmonary edema. Mild degenerative changes mid thoracic  spine.  IMPRESSION: No active cardiopulmonary disease.  No significant change.   Electronically Signed   By: Lahoma Crocker M.D.   On: 02/15/2015 12:58    ECG  NSR w/o ischemia    ASSESSMENT AND PLAN  1. Chest Pain: Initial point-of-care troponin is negative. D-dimer is negative. Additonal laboratory work including CBC and BMP unremarkable. Chest x-ray unremarkable. EKG shows normal sinus rhythm without any ischemic abnormalities. She did have a normal nuclear stress test in March 2015. However, given her multiple risk factors including diabetes, hypertension and hyperlipidemia along with her recent symptoms of resting substernal chest discomfort, recommend admission to telemetry to rule out acute coronary syndrome. Continue to cycle cardiac enzymes 3. Will give a dose of sublingual nitroglycerin in the ED to see if any positive response. Will plan for nuclear chest test in the a.m. Make NPO at midnight. Will also need to consider possible GI etiologies. If no response to  SL nitroglycerin, can give trial of GI cocktail and PPI therapy.   2. Hypertension: Pressure is well-controlled. Continue home regimen.  3. HLD: Continue simvastatin  4. Diabetes: Continue home regimen   Signed, Lyda Jester, PA-C 02/15/2015, 3:37 PM

## 2015-02-15 NOTE — Progress Notes (Addendum)
[facility] paged cardiology Dr. Tamala Julian regarding admission status.  Awaiting call back. EDCM may have paged incorrect Dr. Tamala Julian.  [facility] text paged cardiology Dr. Tamala Julian regarding admission status at 1816pm.  Awaiting response.

## 2015-02-15 NOTE — ED Provider Notes (Signed)
CSN: [id number]     Arrival date & time 02/15/15  1140 History   First MD Initiated Contact with Patient 02/15/15 1257     Chief Complaint  Patient presents with  . Chest Pain     (Consider location/radiation/quality/duration/timing/severity/associated sxs/prior Treatment) HPI  Pt presenting with c/o chest pain.  She states pain began last night while she was lying down.  She describes pain as soreness over her ribs.  Pain starts in subxyphoid region and radiates underneath left breast.  She states usually when she has reflux pain she takes antacids and burps and pain is relieve.  This time antacids did not help.  Pain is resolved now, but there is some tenderness over left lower ribs with palpation.  No shortness of breath.  No fever/chills, no cough. No leg swelling.  No nausea or diaphoresis.  No hx of recent travel/trauma/surgery.  There are no other associated systemic symptoms, there are no other alleviating or modifying factors.   Past Medical History  Diagnosis Date  . Hyperlipidemia   . DM2 (diabetes mellitus, type 2)   . Hypertension   . Allergic rhinitis   . GERD (gastroesophageal reflux disease)   . Osteoporosis   . Uterine fibroid   . Alkaline phosphatase deficiency     w/u Ne  . Dyslipidemia   . Thyroid nodule     small  . Gout   . Anxiety   . Hemorrhoids    Past Surgical History  Procedure Laterality Date  . 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 Stress cardiolite  02/12/2006  . Echocardiogram (other)  01/16/2002  . Tubal ligation    . Toenail excision     Family History  Problem Relation Age of Onset  . Heart disease Mother   . Heart disease Maternal Grandfather   . Rectal cancer Maternal Grandfather   . Stomach cancer Maternal Grandmother   . Heart disease Father    Social History  Substance Use Topics  . Smoking status: Former Smoker -- 0.25 packs/day for 5 years    Types: Cigarettes    Quit date: 06/29/1968  . Smokeless tobacco: Never Used  .  Alcohol Use: No   OB History    No data available     Review of Systems  ROS reviewed and all otherwise negative except for mentioned in HPI    Allergies  Olmesartan medoxomil  Home Medications   Prior to Admission medications   Medication Sig Start Date End Date Taking? Authorizing Provider  amLODipine (NORVASC) 2.5 MG tablet TAKE 1 TABLET BY MOUTH DAILY 07/03/14  Yes Dorothy Spark, MD  aspirin EC 81 MG tablet Take 81 mg by mouth daily.   Yes Historical Provider, MD  cetirizine (ZYRTEC) 10 MG tablet Take 10 mg by mouth daily.   Yes Historical Provider, MD  esomeprazole (NEXIUM) 40 MG capsule TAKE 1 CAPSULE (40 MG TOTAL) BY MOUTH DAILY BEFORE BREAKFAST. APPOINTMENT NEEDED FOR FURTHER REFILLS 08/01/14  Yes Renato Shin, MD  gabapentin (NEURONTIN) 600 MG tablet Take 1 tablet (600 mg total) by mouth 3 (three) times daily. 08/06/14  Yes Renato Shin, MD  metFORMIN (GLUCOPHAGE-XR) 500 MG 24 hr tablet 4 tabs daily Patient taking differently: Take 1,000 mg by mouth 2 (two) times daily. 4 tabs daily 04/04/14  Yes Renato Shin, MD  pioglitazone (ACTOS) 15 MG tablet Take 1 tablet (15 mg total) by mouth daily. 02/08/15  Yes Renato Shin, MD  simvastatin (ZOCOR) 80 MG tablet TAKE 1  TABLET (80 MG TOTAL) BY MOUTH AT BEDTIME. 02/11/15  Yes Renato Shin, MD  valsartan-hydrochlorothiazide (DIOVAN-HCT) 160-12.5 MG per tablet TAKE 1 TABLET BY MOUTH DAILY. APPOINTMENT NEEDED FOR FURTHER REFILLS 12/27/14  Yes Renato Shin, MD  Blood Glucose Monitoring Suppl (ONE TOUCH ULTRA MINI) W/DEVICE KIT 1 Device by Does not apply route once. 11/25/12   Renato Shin, MD  glucose blood (ONE TOUCH ULTRA TEST) test strip TEST BLOOD SUGAR THREE TIMES A DAY 10/25/14   Renato Shin, MD  [facility] DELICA LANCETS 73U MISC USE AS DIRECTED BY PRESCRIBER THREE TIMES A DAY    Renato Shin, MD  traMADol (ULTRAM) 50 MG tablet Take 1 tablet (50 mg total) by mouth every 8 (eight) hours as needed. Patient not taking: Reported on 02/15/2015  07/16/14   Lyndal Pulley, DO  triamcinolone cream (KENALOG) 0.1 % APPLY 1 APPLICATION TOPICALLY 4 (FOUR) TIMES DAILY AS NEEDED FOR RASH Patient not taking: Reported on 02/15/2015 11/29/14   Renato Shin, MD   BP 106/61 mmHg  Pulse 71  Temp(Src) 98 F (36.7 C) (Oral)  Resp 16  Ht '5\' 4"'  (1.626 m)  Wt 171 lb 15.3 oz (78 kg)  BMI 29.50 kg/m2  SpO2 94%  Vitals reviewed Physical Exam  Physical Examination: General appearance - alert, well appearing, and in no distress Mental status - alert, oriented to person, place, and time Eyes - no conjunctival injection, no scleral icterus Mouth - mucous membranes moist, pharynx normal without lesions Chest - clear to auscultation, no wheezes, rales or rhonchi, symmetric air entry Heart - normal rate, regular rhythm, normal S1, S2, no murmurs, rubs, clicks or gallops Abdomen - soft, nontender, nondistended, no masses or organomegaly Neurological - alert, oriented x 3, strength and sensation grossly intact Extremities - peripheral pulses normal, no pedal edema, no clubbing or cyanosis Skin - normal coloration and turgor, no rashes  ED Course  Procedures (including critical care time) Labs Review Labs Reviewed  BASIC METABOLIC PANEL - Abnormal; Notable for the following:    Glucose, Bld 121 (*)    All other components within normal limits  GLUCOSE, CAPILLARY - Abnormal; Notable for the following:    Glucose-Capillary 152 (*)    All other components within normal limits  LIPID PANEL - Abnormal; Notable for the following:    HDL 31 (*)    All other components within normal limits  GLUCOSE, CAPILLARY - Abnormal; Notable for the following:    Glucose-Capillary 131 (*)    All other components within normal limits  BASIC METABOLIC PANEL  CBC  D-DIMER, QUANTITATIVE (NOT AT [facility])  TROPONIN I  TROPONIN I  TROPONIN I  I-STAT TROPOININ, ED  CBG MONITORING, ED  Randolm Idol, ED    Imaging Review Dg Chest 2 View  02/15/2015   CLINICAL DATA:   Chest pain, shortness of breath starting yesterday  EXAM: CHEST  2 VIEW  COMPARISON:  01/10/2014  FINDINGS: Cardiomediastinal silhouette is stable. No acute infiltrate or pleural effusion. No pulmonary edema. Mild degenerative changes mid thoracic spine.  IMPRESSION: No active cardiopulmonary disease.  No significant change.   Electronically Signed   By: Lahoma Crocker M.D.   On: 02/15/2015 12:58   I have personally reviewed and evaluated these images and lab results as part of my medical decision-making.   EKG Interpretation   Date/Time:  Friday February 15 2015 11:47:24 EDT Ventricular Rate:  76 PR Interval:  164 QRS Duration: 90 QT Interval:  412 QTC Calculation: 463 R Axis:  0 Text Interpretation:  Normal sinus rhythm Minimal voltage criteria for  LVH, may be normal variant Borderline ECG No old tracing to compare  Confirmed by [facility]  MD, Lakeidra Reliford [phone]) on 02/15/2015 1:24:20 PM      MDM   Final diagnoses:  Chest pain    Pt presenting with c/o intermittent chest pain occurring at rest.  Pt is patient of Dr. Meda Coffee, cardiology- ekg and troponin are reassuring.  Doubt PE.  D/w cardiology who will see patient,  Per chart review patient had negative stress in 2015.  Pt signed out to Dr. Tyrone Nine pending cardiology consult and disposition based on their recommendations.  Pt is chest pain free in the ED.    3:27 PM d/w cardiology, they will consult on patient. Getting second troponin now.    Alfonzo Beers, MD 02/16/15 [phone]

## 2015-02-15 NOTE — ED Notes (Signed)
Pt reports to the ED for eval of substernal chest pressure that began yesterday. She reports the pain began yesterday while she was laying in bed. The pressure is now gone but there is still a soreness. Today she developed some dizziness and SOB with minimal exertion. Has increased stress in her life from the passing of a family member and was recently started on a new medication for her blood sugar and reports it has cardiac side effects. Pt A&Ox4, resp e/u, and skin warm and dry.

## 2015-02-15 NOTE — ED Notes (Signed)
cbg 92 

## 2015-02-15 NOTE — ED Notes (Signed)
Report attempted. Ray rn to call back

## 2015-02-15 NOTE — ED Notes (Signed)
Cardiology at bedside.

## 2015-02-16 ENCOUNTER — Observation Stay (HOSPITAL_COMMUNITY): Payer: Medicare Other

## 2015-02-16 DIAGNOSIS — I270 Primary pulmonary hypertension: Secondary | ICD-10-CM

## 2015-02-16 DIAGNOSIS — R0600 Dyspnea, unspecified: Secondary | ICD-10-CM

## 2015-02-16 DIAGNOSIS — R0789 Other chest pain: Secondary | ICD-10-CM | POA: Diagnosis not present

## 2015-02-16 DIAGNOSIS — I10 Essential (primary) hypertension: Secondary | ICD-10-CM | POA: Diagnosis not present

## 2015-02-16 DIAGNOSIS — R079 Chest pain, unspecified: Secondary | ICD-10-CM | POA: Diagnosis not present

## 2015-02-16 LAB — LIPID PANEL
Cholesterol: 121 mg/dL (ref 0–200)
HDL: 31 mg/dL — ABNORMAL LOW (ref >40)
LDL CALC: 66 mg/dL (ref 0–99)
TRIGLYCERIDES: 120 mg/dL (ref <150)
Total CHOL/HDL Ratio: 3.9 RATIO
VLDL: 24 mg/dL (ref 0–40)

## 2015-02-16 LAB — TROPONIN I

## 2015-02-16 LAB — BASIC METABOLIC PANEL
ANION GAP: 8 (ref 5–15)
BUN: 11 mg/dL (ref 6–20)
CALCIUM: 9.2 mg/dL (ref 8.9–10.3)
CO2: 27 mmol/L (ref 22–32)
Chloride: 103 mmol/L (ref 101–111)
Creatinine, Ser: 0.69 mg/dL (ref 0.44–1.00)
GFR calc Af Amer: >60 mL/min (ref >60)
GLUCOSE: 121 mg/dL — AB (ref 65–99)
Potassium: 4 mmol/L (ref 3.5–5.1)
SODIUM: 138 mmol/L (ref 135–145)

## 2015-02-16 LAB — GLUCOSE, CAPILLARY
GLUCOSE-CAPILLARY: 131 mg/dL — AB (ref 65–99)
GLUCOSE-CAPILLARY: 145 mg/dL — AB (ref 65–99)
Glucose-Capillary: 151 mg/dL — ABNORMAL HIGH (ref 65–99)

## 2015-02-16 MED ORDER — REGADENOSON 0.4 MG/5ML IV SOLN
INTRAVENOUS | Status: AC
  Filled 2015-02-16: qty 5

## 2015-02-16 MED ORDER — TECHNETIUM TC 99M SESTAMIBI - CARDIOLITE
32.9000 | Freq: Once | INTRAVENOUS | Status: AC | PRN
  Administered 2015-02-16: 32.9 via INTRAVENOUS

## 2015-02-16 MED ORDER — TECHNETIUM TC 99M SESTAMIBI GENERIC - CARDIOLITE
10.5900 | Freq: Once | INTRAVENOUS | Status: AC | PRN
  Administered 2015-02-16: 11 via INTRAVENOUS

## 2015-02-16 NOTE — Progress Notes (Signed)
Initial Nutrition Assessment  DOCUMENTATION CODES:  Not applicable  INTERVENTION:  Gave requested education on [facility] diet  Gave education on DM diet  NUTRITION DIAGNOSIS:  Food and nutrition related knowledge deficit related to limited prior education as evidenced by asking for help on [facility] diet.  GOAL:  Patient will meet greater than or equal to 90% of their needs  MONITOR:  PO intake, Labs, Diet advancement, I & O's  REASON FOR ASSESSMENT:  Malnutrition Screening Tool    ASSESSMENT:  69 y/o female with h/o HTN, HLD, DM and GERD, presenting to the [facility] ED with chest pain.   Patient denied any weight loss. She states that her normal weight is 167 lbs and she has weighed that x6 months. However, she states she has been trying to lose weight by walking. She states that she tries to follow a DM diet, but its hard for her because she loves starches-discussed plate method and appropriate food group ratios at meals.  She asked for education materials regarding a heart healthy diet. Gave brief ed on diabetes and full ed on [facility] diet  RD provided "Heart Healthy Nutrition Therapy" handout from the Academy of [employer]etics.   Provided examples on ways to decrease sodium and fat intake in diet.    Discouraged intake of processed foods and use of salt shaker. Discussed more appropriate seasonings.   Advised to aim to eat no more then 3 servings of red meat a weak. She does buy some higher sodium/sat fat meats, but states she rinses these as well. Educated patient that this may help, but it wouldn't remove all the sodium. She does have lunch meat, but they are low sodium. She admits to over eating cheese, recommended improving her portion control.  Encouraged to use margarine > butter, low fat milk, eat fish twice a week, avoid high sodium condiments and frozen meals.    Encouraged fresh/frozen fruits and vegetables as well as whole grain sources of carbohydrates to maximize fiber intake.    Expect Good compliance. For the most part, she was already doing most of what I reccommended.   Body mass index is 29.5 kg/(m^2). Pt meets criteria for Overweight based on current BMI.  Current diet order is NPO. Labs and medications reviewed. No further nutrition interventions warranted at this time.  Diet Order:  Diet NPO time specified  Skin:  Reviewed, no issues  Last BM:  8/19  Height:  Ht Readings from Last 1 Encounters:  02/15/15 5\' 4"  (1.626 m)   Weight:  Wt Readings from Last 1 Encounters:  02/15/15 171 lb 15.3 oz (78 kg)   Wt Readings from Last 10 Encounters:  02/15/15 171 lb 15.3 oz (78 kg)  02/08/15 171 lb (77.565 kg)  12/12/14 170 lb (77.111 kg)  11/16/14 170 lb (77.111 kg)  10/02/14 166 lb (75.297 kg)  08/06/14 172 lb (78.019 kg)  06/07/14 171 lb (77.565 kg)  04/09/14 169 lb (76.658 kg)  04/03/14 170 lb (77.111 kg)  03/26/14 173 lb (78.472 kg)   Ideal Body Weight:  54.54 kg  BMI:  Body mass index is 29.5 kg/(m^2).  Estimated Nutritional Needs:  Kcal:  1550-1800 kcals (20-23 kcal/kg) Protein:  55-65 g (1-1.2 g/kg IBW) Fluid:  1.6-1.8 liters  EDUCATION NEEDS:  Education needs addressed  Burtis Junes RD, LDN Nutrition Pager: [phone] 02/16/2015 10:56 AM

## 2015-02-16 NOTE — Discharge Instructions (Signed)

## 2015-02-16 NOTE — Discharge Summary (Signed)
Discharge Summary   Patient ID: Amy Escobar,  MRN: [id number], DOB/AGE: 1945/07/07 69 y.o.  Admit date: 02/15/2015 Discharge date: 02/16/2015  Primary Care Provider: Renato Shin Primary Cardiologist: Dr. Meda Coffee  Discharge Diagnoses Principal Problem:   Discomfort in chest Active Problems:   HYPERCHOLESTEROLEMIA   Essential hypertension   Dyspnea   Pulmonary hypertension   Chest pain   Allergies Allergies  Allergen Reactions  . Olmesartan Medoxomil     REACTION: headache    Procedures  Treadmill Myoview 02/16/2015  IMPRESSION: 1. No reversible ischemia or infarction.  2. Normal left ventricular wall motion.  3. Left ventricular ejection fraction 57%  4. Low-risk stress test findings*.     Hospital Course  The patient is a 69 year old female with past medical history of HTN, HLD, DM and GERD. She was referred to Dr. Meda Coffee in 2015 for dyspnea on exertion. She had a Lexiscan stress test in March 2015 which was negative for scar or ischemia. Her most recent echocardiogram in October 2015 showed normal LV systolic function with EF 55-60%. She presented to [facility] on 02/15/2015 with complaint of chest pain that started around 4 PM the previous day. She described it as a dull ache occurring at rest. She denies any radiation to her chest, upper arm, neck or back. The episode was on and off lasting roughly 5-10 minutes at a time. Initial troponin 1 was negative. D-dimer was also negative excluding possibility of PE. Her EKG showed normal sinus rhythm without any acute ischemic abnormality.  She was admitted to cardiac service, serial troponin overnight were negative. Patient underwent treadmill nuclear stress test in the morning of 02/16/2015 which came back low risk with EF of 57%, no reversible ischemia or infarction. Patient is deemed stable for discharge from cardiology perspective. She will keep her current cardiology follow-up in December with Dr.  Meda Coffee.   Discharge Vitals Blood pressure 113/58, pulse 88, temperature 97.8 F (36.6 C), temperature source Oral, resp. rate 19, height $RemoveBe'5\' 4"'BBapbuARV$  (1.626 m), weight 171 lb 15.3 oz (78 kg), SpO2 94 %.  Filed Weights   02/15/15 1148 02/15/15 1858  Weight: 171 lb (77.565 kg) 171 lb 15.3 oz (78 kg)    Labs  CBC  Recent Labs  02/15/15 1215  WBC 6.5  HGB 13.1  HCT 40.4  MCV 87.3  PLT 883   Basic Metabolic Panel  Recent Labs  02/15/15 1215 02/16/15 0644  NA 135 138  K 4.0 4.0  CL 101 103  CO2 25 27  GLUCOSE 99 121*  BUN 11 11  CREATININE 0.85 0.69  CALCIUM 9.2 9.2   Cardiac Enzymes  Recent Labs  02/15/15 1940 02/16/15 0021 02/16/15 0644  TROPONINI <0.03 <0.03 <0.03   D-Dimer  Recent Labs  02/15/15 1402  DDIMER <0.27   Fasting Lipid Panel  Recent Labs  02/16/15 0021  CHOL 121  HDL 31*  LDLCALC 66  TRIG 120  CHOLHDL 3.9    Disposition  Pt is being discharged home today in good condition.  Follow-up Plans & Appointments      Follow-up Information    Follow up with Dorothy Spark, MD On 06/12/2015.   Specialty:  Cardiology   Why:  8:00am   Contact information:   [place] [id number] [phone]       Discharge Medications    Medication List    STOP taking these medications        traMADol 50 MG  tablet  Commonly known as:  ULTRAM     triamcinolone cream 0.1 %  Commonly known as:  KENALOG      TAKE these medications        amLODipine 2.5 MG tablet  Commonly known as:  NORVASC  TAKE 1 TABLET BY MOUTH DAILY     aspirin EC 81 MG tablet  Take 81 mg by mouth daily.     cetirizine 10 MG tablet  Commonly known as:  ZYRTEC  Take 10 mg by mouth daily.     esomeprazole 40 MG capsule  Commonly known as:  NEXIUM  TAKE 1 CAPSULE (40 MG TOTAL) BY MOUTH DAILY BEFORE BREAKFAST. APPOINTMENT NEEDED FOR FURTHER REFILLS     gabapentin 600 MG tablet  Commonly known as:  NEURONTIN  Take 1 tablet (600  mg total) by mouth 3 (three) times daily.     glucose blood test strip  Commonly known as:  ONE TOUCH ULTRA TEST  TEST BLOOD SUGAR THREE TIMES A DAY     metFORMIN 500 MG 24 hr tablet  Commonly known as:  GLUCOPHAGE-XR  4 tabs daily     ONE TOUCH ULTRA MINI W/DEVICE Kit  1 Device by Does not apply route once.     ONETOUCH DELICA LANCETS 78E Misc  USE AS DIRECTED BY PRESCRIBER THREE TIMES A DAY     pioglitazone 15 MG tablet  Commonly known as:  ACTOS  Take 1 tablet (15 mg total) by mouth daily.     simvastatin 80 MG tablet  Commonly known as:  ZOCOR  TAKE 1 TABLET (80 MG TOTAL) BY MOUTH AT BEDTIME.     valsartan-hydrochlorothiazide 160-12.5 MG per tablet  Commonly known as:  DIOVAN-HCT  TAKE 1 TABLET BY MOUTH DAILY. APPOINTMENT NEEDED FOR FURTHER REFILLS        Duration of Discharge Encounter   Greater than 30 minutes including physician time.  Hilbert Corrigan PA-C Pager: [phone] 02/16/2015, 8:56 PM

## 2015-02-16 NOTE — Progress Notes (Signed)
Patient Name: Amy Escobar Date of Encounter: 02/16/2015  Primary Cardiologist: Dr. Meda Coffee   Principal Problem:   Discomfort in chest Active Problems:   HYPERCHOLESTEROLEMIA   Essential hypertension   Dyspnea   Pulmonary hypertension   Chest pain    SUBJECTIVE  Denies any CP or SOB overnight. Last episode of CP was yesterday afternoon.  CURRENT MEDS . amLODipine  2.5 mg Oral Daily  . aspirin EC  81 mg Oral Daily  . atorvastatin  40 mg Oral q1800  . gabapentin  600 mg Oral TID  . heparin  5,000 Units Subcutaneous 3 times per day  . hydrochlorothiazide  12.5 mg Oral Daily  . irbesartan  150 mg Oral Daily  . pantoprazole  40 mg Oral Daily  . pioglitazone  15 mg Oral QAC breakfast  . regadenoson        OBJECTIVE  Filed Vitals:   02/15/15 1858 02/16/15 0047 02/16/15 0453 02/16/15 0724  BP: 128/80 98/57 100/46 106/61  Pulse: 74 73 63 71  Temp: 98.2 F (36.8 C) 98 F (36.7 C) 98.1 F (36.7 C) 98 F (36.7 C)  TempSrc: Oral Oral Oral Oral  Resp: 22 18 18 16   Height: 5\' 4"  (1.626 m)     Weight: 171 lb 15.3 oz (78 kg)     SpO2: 97% 100% 96% 94%   No intake or output data in the 24 hours ending 02/16/15 1155 Filed Weights   02/15/15 1148 02/15/15 1858  Weight: 171 lb (77.565 kg) 171 lb 15.3 oz (78 kg)    PHYSICAL EXAM  General: Pleasant, NAD. Neuro: Alert and oriented X 3. Moves all extremities spontaneously. Psych: Normal affect. HEENT:  Normal  Neck: Supple without bruits or JVD. Lungs:  Resp regular and unlabored, CTA. Heart: RRR no s3, s4, or murmurs. Abdomen: Soft, non-tender, non-distended, BS + x 4.  Extremities: No clubbing, cyanosis or edema. DP/PT/Radials 2+ and equal bilaterally.  Accessory Clinical Findings  CBC  Recent Labs  02/15/15 1215  WBC 6.5  HGB 13.1  HCT 40.4  MCV 87.3  PLT 627   Basic Metabolic Panel  Recent Labs  02/15/15 1215 02/16/15 0644  NA 135 138  K 4.0 4.0  CL 101 103  CO2 25 27  GLUCOSE 99 121*  BUN  11 11  CREATININE 0.85 0.69  CALCIUM 9.2 9.2   Cardiac Enzymes  Recent Labs  02/15/15 1940 02/16/15 0021 02/16/15 0644  TROPONINI <0.03 <0.03 <0.03   D-Dimer  Recent Labs  02/15/15 1402  DDIMER <0.27   Fasting Lipid Panel  Recent Labs  02/16/15 0021  CHOL 121  HDL 31*  LDLCALC 66  TRIG 120  CHOLHDL 3.9   ECG  No new EKG  Echocardiogram 04/10/2014  LV EF: 55% -  60%  ------------------------------------------------------------------- Indications:   Shortness of breath (R06.02).  ------------------------------------------------------------------- History:  PMH:  Dyspnea. Risk factors: GERD. Dysphagia. Thyroid nodule. Carpal tunnel syndrome. Osteoporosis. Gastrocnemius tear. Osteoarthritis. Gout. Anxiety. Back pain. Foot pain. Fatigue. Headache. Former tobacco use. Hypertension. Diabetes mellitus. Dyslipidemia.  ------------------------------------------------------------------- Study Conclusions  - Left ventricle: The cavity size was normal. Wall thickness was increased in a pattern of mild LVH. Systolic function was normal. The estimated ejection fraction was in the range of 55% to 60%. Wall motion was normal; there were no regional wall motion abnormalities. Doppler parameters are consistent with abnormal left ventricular relaxation (grade 1 diastolic dysfunction). - Pulmonary arteries: Systolic pressure was mildly increased. PA peak pressure: 36 mm  Hg (S).  Impressions:  - Normal LV function; mild LVH; grade 1 diastolic dysfunction; mild TR; mildly elevated pulmonary pressure. Compared to 09/20/13, LV function remains normal.    Radiology/Studies  Dg Chest 2 View  02/15/2015   CLINICAL DATA:  Chest pain, shortness of breath starting yesterday  EXAM: CHEST  2 VIEW  COMPARISON:  01/10/2014  FINDINGS: Cardiomediastinal silhouette is stable. No acute infiltrate or pleural effusion. No pulmonary edema. Mild degenerative changes  mid thoracic spine.  IMPRESSION: No active cardiopulmonary disease.  No significant change.   Electronically Signed   By: Lahoma Crocker M.D.   On: 02/15/2015 12:58    ASSESSMENT AND PLAN  69 yo female with PMH of HTN, HLD, DM, and GERD presented to [facility] on 02/15/2015 with chest pain  1. Chest pain  - d-dimer negative. Serial trop negative. Recent stress related to death of her stepfather  - pending treadmill myoview today  2. HTN 3. HLD 4. DM  Signed, Woodward Ku Pager: [phone]   The patient was seen, examined and discussed with Almyra Deforest, PA-C and I agree with the above.   69 year old female with chest pain that has resolved, now asymptomatic, underwent a nuclear stress test today. If negative can be discharged home. Troponin negative x 3, BP controlled, continue atorvastatin 40 mg po daily, asa 81 mg po daily.  Dorothy Spark 02/16/2015

## 2015-02-16 NOTE — Progress Notes (Signed)
Treadmill nuc completed without significant complication. Pending final result by [facility] radiology  Signed, Almyra Deforest PA Pager: [phone]

## 2015-03-08 DIAGNOSIS — H2513 Age-related nuclear cataract, bilateral: Secondary | ICD-10-CM | POA: Diagnosis not present

## 2015-03-08 DIAGNOSIS — H33101 Unspecified retinoschisis, right eye: Secondary | ICD-10-CM | POA: Diagnosis not present

## 2015-03-08 DIAGNOSIS — E119 Type 2 diabetes mellitus without complications: Secondary | ICD-10-CM | POA: Diagnosis not present

## 2015-03-08 LAB — HM DIABETES EYE EXAM

## 2015-03-11 ENCOUNTER — Other Ambulatory Visit: Payer: Self-pay

## 2015-03-11 MED ORDER — VALSARTAN-HYDROCHLOROTHIAZIDE 160-12.5 MG PO TABS: ORAL_TABLET | ORAL | Status: DC

## 2015-03-11 MED ORDER — GLUCOSE BLOOD VI STRP: ORAL_STRIP | Status: DC

## 2015-04-01 ENCOUNTER — Other Ambulatory Visit: Payer: Self-pay | Admitting: *Deleted

## 2015-04-01 MED ORDER — TRAMADOL HCL 50 MG PO TABS: 50.0000 mg | ORAL_TABLET | Freq: Three times a day (TID) | ORAL | Status: DC | PRN

## 2015-04-01 NOTE — Telephone Encounter (Signed)
Per dr Tamala Julian, okay to refill tramadol but pt will need to be seen to receive another one.  rx sent into pharmacy, pt made aware.

## 2015-04-04 ENCOUNTER — Encounter: Payer: Self-pay | Admitting: Endocrinology

## 2015-04-04 DIAGNOSIS — H2513 Age-related nuclear cataract, bilateral: Secondary | ICD-10-CM | POA: Diagnosis not present

## 2015-04-04 DIAGNOSIS — H524 Presbyopia: Secondary | ICD-10-CM | POA: Diagnosis not present

## 2015-04-04 DIAGNOSIS — H2511 Age-related nuclear cataract, right eye: Secondary | ICD-10-CM | POA: Diagnosis not present

## 2015-04-04 DIAGNOSIS — E119 Type 2 diabetes mellitus without complications: Secondary | ICD-10-CM | POA: Diagnosis not present

## 2015-04-04 LAB — HM DIABETES EYE EXAM

## 2015-04-11 ENCOUNTER — Other Ambulatory Visit: Payer: Self-pay | Admitting: *Deleted

## 2015-04-11 MED ORDER — METFORMIN HCL ER 500 MG PO TB24: 1000.0000 mg | ORAL_TABLET | Freq: Two times a day (BID) | ORAL | Status: DC

## 2015-04-17 DIAGNOSIS — H2511 Age-related nuclear cataract, right eye: Secondary | ICD-10-CM | POA: Diagnosis not present

## 2015-04-17 DIAGNOSIS — H2512 Age-related nuclear cataract, left eye: Secondary | ICD-10-CM | POA: Diagnosis not present

## 2015-04-24 DIAGNOSIS — H2512 Age-related nuclear cataract, left eye: Secondary | ICD-10-CM | POA: Diagnosis not present

## 2015-04-29 ENCOUNTER — Other Ambulatory Visit: Payer: Self-pay | Admitting: Endocrinology

## 2015-05-09 ENCOUNTER — Ambulatory Visit (INDEPENDENT_AMBULATORY_CARE_PROVIDER_SITE_OTHER): Payer: Medicare Other | Admitting: Endocrinology

## 2015-05-09 ENCOUNTER — Encounter: Payer: Self-pay | Admitting: Endocrinology

## 2015-05-09 VITALS — BP 122/70 | HR 75 | Temp 98.3°F | Wt 172.0 lb

## 2015-05-09 DIAGNOSIS — IMO0001 Reserved for inherently not codable concepts without codable children: Secondary | ICD-10-CM

## 2015-05-09 DIAGNOSIS — E1165 Type 2 diabetes mellitus with hyperglycemia: Secondary | ICD-10-CM

## 2015-05-09 LAB — POCT GLYCOSYLATED HEMOGLOBIN (HGB A1C): Hemoglobin A1C: 5.8

## 2015-05-09 NOTE — Patient Instructions (Addendum)
Please continue the same medications.   Please come back for a regular physical appointment next year (must be after 08/07/15).

## 2015-05-09 NOTE — Progress Notes (Signed)
Subjective:    Patient ID: Amy Escobar, female    DOB: 1945/11/16, 69 y.o.   MRN: [id number]  HPI Pt returns for f/u of diabetes mellitus: DM type: 2 Dx'ed: 4098 Complications: none Therapy: metformin GDM: never.   DKA: never.   Severe hypoglycemia: never.   Pancreatitis: never.  Other: she has never been on insulin.   Interval history: pt states she feels well in general.  She takes metformin as rx'ed.   Past Medical History  Diagnosis Date  . Hyperlipidemia   . DM2 (diabetes mellitus, type 2) ([place])   . Hypertension   . Allergic rhinitis   . GERD (gastroesophageal reflux disease)   . Osteoporosis   . Uterine fibroid   . Alkaline phosphatase deficiency     w/u Ne  . Dyslipidemia   . Thyroid nodule     small  . Gout   . Anxiety   . Hemorrhoids     Past Surgical History  Procedure Laterality Date  . 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 Stress cardiolite  02/12/2006  . Echocardiogram (other)  01/16/2002  . Tubal ligation    . Toenail excision      Social History   Social History  . Marital Status: Widowed    Spouse Name: N/A  . Number of Children: 2  . 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 Smoking status: Former Smoker -- 0.25 packs/day for 5 years    Types: Cigarettes    Quit date: 06/29/1968  . Smokeless tobacco: Never Used  . Alcohol Use: No  . Drug Use: No  . Sexual Activity: Not on file   Other Topics Concern  . Not on file   Social History Narrative   Widowed 2010.     Current Outpatient Prescriptions on File Prior to Visit  Medication Sig Dispense Refill  . amLODipine (NORVASC) 2.5 MG tablet TAKE 1 TABLET BY MOUTH DAILY 90 tablet 3  . aspirin EC 81 MG tablet Take 81 mg by mouth daily.    . Blood Glucose Monitoring Suppl (ONE TOUCH ULTRA MINI) W/DEVICE KIT 1 Device by Does not apply route once. 1 each 0  . esomeprazole (NEXIUM) 40 MG capsule TAKE 1 CAPSULE (40 MG TOTAL)  BY MOUTH DAILY BEFORE BREAKFAST. APPOINTMENT NEEDED FOR FURTHER REFILLS 30 capsule 9  . gabapentin (NEURONTIN) 600 MG tablet Take 1 tablet (600 mg total) by mouth 3 (three) times daily. 90 tablet 11  . metFORMIN (GLUCOPHAGE-XR) 500 MG 24 hr tablet Take 2 tablets (1,000 mg total) by mouth 2 (two) times daily. 4 tabs daily 120 tablet 2  . ONE TOUCH ULTRA TEST test strip TEST BLOOD SUGAR THREE TIMES A DAY 100 each 2  . ONETOUCH DELICA LANCETS 11B MISC USE AS DIRECTED BY PRESCRIBER THREE TIMES A DAY 300 each 9  . pioglitazone (ACTOS) 15 MG tablet Take 1 tablet (15 mg total) by mouth daily. 30 tablet 11  . simvastatin (ZOCOR) 80 MG tablet TAKE 1 TABLET (80 MG TOTAL) BY MOUTH AT BEDTIME. 90 tablet 1  . traMADol (ULTRAM) 50 MG tablet Take 1 tablet (50 mg total) by mouth every 8 (eight) hours as needed. 30 tablet 0  . valsartan-hydrochlorothiazide (DIOVAN-HCT) 160-12.5 MG per tablet Take 1 tablet by mouth daily 30 tablet 1   No current facility-administered medications on file prior to visit.    Allergies  Allergen Reactions  .  Olmesartan Medoxomil     REACTION: headache    Family History  Problem Relation Age of Onset  . Heart disease Mother   . Heart disease Maternal Grandfather   . Rectal cancer Maternal Grandfather   . Stomach cancer Maternal Grandmother   . Heart disease Father     BP 122/70 mmHg  Pulse 75  Temp(Src) 98.3 F (36.8 C) (Oral)  Wt 172 lb (78.019 kg)  SpO2 97%  Review of Systems No weight change.      Objective:   Physical Exam VITAL SIGNS:  See vs page.   GENERAL: no distress.  Pulses: dorsalis pedis intact bilat.   MSK: no deformity of the feet CV: no leg edema.   Skin:  no ulcer on the feet.  normal color and temp on the feet. Neuro: sensation is intact to touch on the feet.     A1c=5.8%    Assessment & Plan:  DM: well-controlled  Patient is advised the following: Patient Instructions  Please continue the same medications.   Please come back for a  regular physical appointment next year (must be after 08/07/15).

## 2015-05-10 ENCOUNTER — Other Ambulatory Visit: Payer: Self-pay

## 2015-05-10 ENCOUNTER — Other Ambulatory Visit: Payer: Self-pay | Admitting: Endocrinology

## 2015-05-10 MED ORDER — VALSARTAN-HYDROCHLOROTHIAZIDE 160-12.5 MG PO TABS: ORAL_TABLET | ORAL | Status: DC

## 2015-05-30 DIAGNOSIS — Z961 Presence of intraocular lens: Secondary | ICD-10-CM | POA: Diagnosis not present

## 2015-06-03 ENCOUNTER — Other Ambulatory Visit (INDEPENDENT_AMBULATORY_CARE_PROVIDER_SITE_OTHER): Payer: Medicare Other | Admitting: *Deleted

## 2015-06-03 DIAGNOSIS — I10 Essential (primary) hypertension: Secondary | ICD-10-CM

## 2015-06-03 DIAGNOSIS — E7800 Pure hypercholesterolemia, unspecified: Secondary | ICD-10-CM | POA: Diagnosis not present

## 2015-06-03 LAB — LIPID PANEL
Cholesterol: 121 mg/dL — ABNORMAL LOW (ref 125–200)
HDL: 41 mg/dL — ABNORMAL LOW (ref >=46)
LDL Cholesterol: 66 mg/dL (ref <130)
Total CHOL/HDL Ratio: 3 Ratio (ref <=5.0)
Triglycerides: 70 mg/dL (ref <150)
VLDL: 14 mg/dL (ref <30)

## 2015-06-03 LAB — COMPREHENSIVE METABOLIC PANEL
ALT: 30 U/L — ABNORMAL HIGH (ref 6–29)
AST: 30 U/L (ref 10–35)
Albumin: 4 g/dL (ref 3.6–5.1)
Alkaline Phosphatase: 90 U/L (ref 33–130)
BUN: 11 mg/dL (ref 7–25)
CO2: 27 mmol/L (ref 20–31)
Calcium: 9.5 mg/dL (ref 8.6–10.4)
Chloride: 104 mmol/L (ref 98–110)
Creat: 0.84 mg/dL (ref 0.50–0.99)
Glucose, Bld: 111 mg/dL — ABNORMAL HIGH (ref 65–99)
Potassium: 3.9 mmol/L (ref 3.5–5.3)
Sodium: 141 mmol/L (ref 135–146)
Total Bilirubin: 0.4 mg/dL (ref 0.2–1.2)
Total Protein: 7.3 g/dL (ref 6.1–8.1)

## 2015-06-03 NOTE — Addendum Note (Signed)
Addended by: Eulis Foster on: 06/03/2015 08:35 AM   Modules accepted: Orders

## 2015-06-07 ENCOUNTER — Other Ambulatory Visit (INDEPENDENT_AMBULATORY_CARE_PROVIDER_SITE_OTHER): Payer: Medicare Other

## 2015-06-07 ENCOUNTER — Telehealth: Payer: Self-pay | Admitting: Cardiology

## 2015-06-07 ENCOUNTER — Ambulatory Visit (INDEPENDENT_AMBULATORY_CARE_PROVIDER_SITE_OTHER): Payer: Medicare Other | Admitting: Family Medicine

## 2015-06-07 ENCOUNTER — Encounter: Payer: Self-pay | Admitting: Family Medicine

## 2015-06-07 VITALS — BP 110/82 | HR 97 | Ht 64.0 in | Wt 174.0 lb

## 2015-06-07 DIAGNOSIS — M25561 Pain in right knee: Secondary | ICD-10-CM

## 2015-06-07 DIAGNOSIS — S83411A Sprain of medial collateral ligament of right knee, initial encounter: Secondary | ICD-10-CM | POA: Diagnosis not present

## 2015-06-07 DIAGNOSIS — S83419A Sprain of medial collateral ligament of unspecified knee, initial encounter: Secondary | ICD-10-CM | POA: Insufficient documentation

## 2015-06-07 NOTE — Assessment & Plan Note (Signed)
Discussed options, decided against brace, given HEP and talked with ATC.   ICe topical NSAIDs, What activities to avoid and proper shoes.  Hold on PT for now  RTC in 4-6 weeks.

## 2015-06-07 NOTE — Progress Notes (Signed)
[name] [address], [place] [address] Phone: [phone] Subjective:     CC: Right knee pain f/u  [id number] Amy Escobar is a 69 y.o. female coming in with complaint of right knee pain. She continued to have knee pain and had internal derangement type symptoms. Patient continued to have pain so an MRI was ordered. Patient's MRI showed a radial tear of the posterior horn of the medial meniscus with peripheral meniscal extrusion.  Patient did respond well to Orthovisc. Patient has not been seen for greater than a year. States that she is having knee pain. Seems to be in the same area. Somewhat a little bit different though. States that it does not feel he gets giving out on her. Pain stays in the medial aspect especially with going up or downstairs. States that she wears heels it seems to be somewhat worse as well. Denies any radiation down the leg or any numbness or weakness. Rates the severity of pain is 4 out of 10. Patient just does not want to get the pain as severe as it was previously.    Past medical history, social, surgical and family history all reviewed in electronic medical record.   Review of Systems: No headache, visual changes, nausea, vomiting, diarrhea, constipation, dizziness, abdominal pain, skin rash, fevers, chills, night sweats, weight loss, swollen lymph nodes, body aches, joint swelling, muscle aches, chest pain, shortness of breath, mood changes.   Objective Blood pressure 110/82, pulse 97, height 5\' 4"  (1.626 m), weight 174 lb (78.926 kg), SpO2 99 %.  General: No apparent distress alert and oriented x3 mood and affect normal, dressed appropriately.  HEENT: Pupils equal, extraocular movements intact  Respiratory: Patient's speak in full sentences and does not appear short of breath  Cardiovascular: No lower extremity edema, non tender, no erythema  Skin: Warm dry intact with no signs of infection or rash on  extremities or on axial skeleton.  Abdomen: Soft nontender  Neuro: Cranial nerves II through XII are intact, neurovascularly intact in all extremities with 2+ DTRs and 2+ pulses.  Lymph: No lymphadenopathy of posterior or anterior cervical chain or axillae bilaterally.  Gait normal with good balance and coordination.  MSK:  Non tender with full range of motion and good stability and symmetric strength and tone of shoulders, elbows, wrist, hip, and ankles bilaterally.  Knee: Right Normal to inspection with no erythema or effusion or obvious bony abnormalities. Patient is moderately tender over the medial aspect of the right knee. ROM full in flexion and extension and lower leg rotation. Ligaments with solid consistent endpoints including ACL, PCL, LCL, MCL.pain with stressing of the MCL Negative Mcmurray's, Apley's, and Thessalonian tests. Non painful patellar compression. Patellar glide without crepitus. Patellar and quadriceps tendons unremarkable. Hamstring and quadriceps strength is normal.  Contralateral knee unremarkable  MSK US performed of: knee This study was ordered, performed, and interpreted by Charlann Boxer D.O.  Knee: All structures visualized. Patient's anterior medial meniscus does have increasing Doppler flow but no tear appreciated. No significant displacement. Patellar Tendon unremarkable on long and transverse views without effusion. No abnormality of prepatellar bursa. Patient's MCL does have significant hypoechoic changes and a very small less than 5% tear on the articular side. No gapping noted on dynamic testing. No abnormality of origin of medial or lateral head of the gastrocnemius.  IMPRESSION:  MCL sprain of the right knee  Procedure note [id number]; 15 minutes spent for Therapeutic exercises as stated  in above notes.  This included exercises focusing on stretching, strengthening, with significant focus on eccentric aspects. Flexin extension VMO and hip abductor  stengthening.    Proper technique shown and discussed handout in great detail with ATC.  All questions were discussed and answered.      Impression and Recommendations:

## 2015-06-07 NOTE — Telephone Encounter (Signed)
Returned patient phone call Wants to reschedule appointment Rescheduled for same day, 2 days earlier

## 2015-06-07 NOTE — Progress Notes (Signed)
Pre visit review using our clinic review tool, if applicable. No additional management support is needed unless otherwise documented below in the visit note. 

## 2015-06-07 NOTE — Telephone Encounter (Signed)
New message ° ° ° ° ° °Calling to talk to the nurse.  She would not tell me what she wanted °

## 2015-06-07 NOTE — Patient Instructions (Signed)
Great to see you Happy holidays!  Ice when you need it pennsaid pinkie amount topically 2 times daily as needed.  Tramadol at night if needed Exercises 3 times a week.  Keep avoiding the heels when possible but OK for special occasions.  See me again in 4 weeks if not perfect

## 2015-06-12 ENCOUNTER — Ambulatory Visit: Payer: Medicare Other | Admitting: Cardiology

## 2015-06-14 ENCOUNTER — Other Ambulatory Visit: Payer: Self-pay | Admitting: Endocrinology

## 2015-06-14 ENCOUNTER — Encounter: Payer: Self-pay | Admitting: Cardiology

## 2015-06-14 ENCOUNTER — Ambulatory Visit (INDEPENDENT_AMBULATORY_CARE_PROVIDER_SITE_OTHER): Payer: Medicare Other | Admitting: Cardiology

## 2015-06-14 ENCOUNTER — Ambulatory Visit: Payer: Medicare Other | Admitting: Cardiology

## 2015-06-14 VITALS — BP 120/60 | HR 76 | Ht 64.0 in | Wt 176.0 lb

## 2015-06-14 DIAGNOSIS — I10 Essential (primary) hypertension: Secondary | ICD-10-CM

## 2015-06-14 DIAGNOSIS — M791 Myalgia, unspecified site: Secondary | ICD-10-CM

## 2015-06-14 DIAGNOSIS — I272 Other secondary pulmonary hypertension: Secondary | ICD-10-CM

## 2015-06-14 DIAGNOSIS — R0600 Dyspnea, unspecified: Secondary | ICD-10-CM

## 2015-06-14 DIAGNOSIS — R0609 Other forms of dyspnea: Secondary | ICD-10-CM

## 2015-06-14 DIAGNOSIS — E7800 Pure hypercholesterolemia, unspecified: Secondary | ICD-10-CM | POA: Diagnosis not present

## 2015-06-14 MED ORDER — SIMVASTATIN 40 MG PO TABS: 40.0000 mg | ORAL_TABLET | Freq: Every day | ORAL | Status: DC

## 2015-06-14 NOTE — Progress Notes (Signed)
Patient ID: [name], female   DOB: Nov 05, 1945, 69 y.o.   MRN: [id number]     Patient Name: Amy Escobar Date of Encounter: 06/14/2015  Primary Care Provider:  Renato Shin, MD Primary Cardiologist:  Dorothy Spark  Problem List   Past Medical History  Diagnosis Date  . Hyperlipidemia   . DM2 (diabetes mellitus, type 2) ([place])   . Hypertension   . Allergic rhinitis   . GERD (gastroesophageal reflux disease)   . Osteoporosis   . Uterine fibroid   . Alkaline phosphatase deficiency     w/u Ne  . Dyslipidemia   . Thyroid nodule     small  . Gout   . Anxiety   . Hemorrhoids    Past Surgical History  Procedure Laterality Date  . 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 Stress cardiolite  02/12/2006  . Echocardiogram (other)  01/16/2002  . Tubal ligation    . Toenail excision     Allergies  Allergies  Allergen Reactions  . Olmesartan Medoxomil     REACTION: headache    HPI  69 year old with hypertension, hyperlipidemia, and known insulin-dependent diabetes mellitus with dyspnea on exertion for which she underwent following tests:   GXT:  Has had DOE - referred here for GXT and also referred to pulmonary. States she is doing better. Trying to lose weight. For PFTs later this month. Today the patient exercised on the standard Bruce protocol for a total of 2:41 minutes.  Poor exercise tolerance.  Adequate blood pressure response.  Clinically negative for chest pain. Test was stopped due to dyspnea; PVCs and over a minute run of bigeminy PVCs..  EKG negative for ischemia. No significant arrhythmia noted.   Lexiscan myoview - negative for scar or ischemia 24 Holter - episodes of nonsustained VT the longest lasting 5 beats for which she underwent Lexiscan Myoview.  Referred for a cardiology consult. The patient denies any palpitations, chest pain, lower extremity edema, orthopnea, paroxysmal nocturnal dyspnea or prior syncope. She states that in the  past she used to be quite active walking for about a mile every day but she stopped in November since then she felt quite short of breath.  She is coming after 2 months, normal stress test, echo showed mild MR, TR, RVSP 47 mmHg, she was started on 2.5 mg of amlodipine and feels significantly better. She exercises 5 x week and feels great. Mild dyspnea on moderate exertion.   06/14/2015 - patient is coming after 6 months. She feels well, she used to go to the gym 5x/week, now stopped as she is dealing with right knee problem. She feels energetic, denies exertional pain, has occasional pains post certain foods, no palpitations, claudications or syncope. Compliant with her meds. She has soome muscle pains with simvastatin.  Home Medications  Prior to Admission medications   Medication Sig Start Date End Date Taking? Authorizing Provider  Blood Glucose Monitoring Suppl (ONE TOUCH ULTRA MINI) W/DEVICE KIT 1 Device by Does not apply route once. 11/25/12  Yes Renato Shin, MD  gabapentin (NEURONTIN) 300 MG capsule One three times a day 09/12/13  Yes Tanda Rockers, MD  naproxen (NAPROSYN) 500 MG tablet Take 1 tablet (500 mg total) by mouth 2 (two) times daily with a meal. 08/10/12  Yes Renato Shin, MD  NEXIUM 40 MG capsule TAKE 1 CAPSULE (40 MG TOTAL) BY MOUTH DAILY BEFORE BREAKFAST.   Yes Renato Shin, MD  ONE TOUCH ULTRA TEST test  strip TEST BLOOD SUGAR THREE(3) TIMES DAILY 06/24/13  Yes Renato Shin, MD  [facility] DELICA LANCETS 16X [place] USE AS DIRECTED BY PRESCRIBER THREE TIMES A DAY   Yes Renato Shin, MD  simvastatin (ZOCOR) 80 MG tablet TAKE 1 TABLET BY MOUTH EVERY NIGHT AT BEDTIME   Yes Renato Shin, MD  valsartan-hydrochlorothiazide (DIOVAN-HCT) 160-12.5 MG per tablet TAKE 1 TABLET BY MOUTH DAILY 07/29/13  Yes Renato Shin, MD    Family History  Family History  Problem Relation Age of Onset  . Heart disease Mother   . Heart disease Maternal Grandfather   . Rectal cancer Maternal Grandfather     . Stomach cancer Maternal Grandmother   . Heart disease Father     Social History  Social History   Social History  . Marital Status: Widowed    Spouse Name: N/A  . Number of Children: 2  . 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 Smoking status: Former Smoker -- 0.25 packs/day for 5 years    Types: Cigarettes    Quit date: 06/29/1968  . Smokeless tobacco: Never Used  . Alcohol Use: No  . Drug Use: No  . Sexual Activity: Not on file   Other Topics Concern  . Not on file   Social History Narrative   Widowed 2010.     Review of Systems, as per HPI, otherwise negative General:  No chills, fever, night sweats or weight changes.  Cardiovascular:  No chest pain, dyspnea on exertion, edema, orthopnea, palpitations, paroxysmal nocturnal dyspnea. Dermatological: No rash, lesions/masses Respiratory: No cough, dyspnea Urologic: No hematuria, dysuria Abdominal:   No nausea, vomiting, diarrhea, bright red blood per rectum, melena, or hematemesis Neurologic:  No visual changes, wkns, changes in mental status. All other systems reviewed and are otherwise negative except as noted above.  Physical Exam  Blood pressure 120/60, pulse 76, height _0  (1.626 m), weight 176 lb (79.833 kg).  General: Pleasant, NAD Psych: Normal affect. Neuro: Alert and oriented X 3. Moves all extremities spontaneously. HEENT: Normal  Neck: Supple without bruits or JVD. Lungs:  Resp regular and unlabored, CTA. Heart: RRR no s3, s4, or murmurs. Abdomen: Soft, non-tender, non-distended, BS + x 4.  Extremities: No clubbing, cyanosis or edema. DP/PT/Radials 2+ and equal bilaterally.  Labs:  No results for input(s): CKTOTAL, CKMB, TROPONINI in the last 72 hours. Lab Results  Component Value Date   WBC 6.5 02/15/2015   HGB 13.1 02/15/2015   HCT 40.4 02/15/2015   MCV 87.3 02/15/2015   PLT 191 02/15/2015    Lab Results  Component Value Date    DDIMER <0.27 02/15/2015   Invalid input(s): POCBNP    Component Value Date/Time   NA 141 06/03/2015 0835   K 3.9 06/03/2015 0835   CL 104 06/03/2015 0835   CO2 27 06/03/2015 0835   GLUCOSE 111* 06/03/2015 0835   BUN 11 06/03/2015 0835   CREATININE 0.84 06/03/2015 0835   CREATININE 0.69 02/16/2015 0644   CALCIUM 9.5 06/03/2015 0835   PROT 7.3 06/03/2015 0835   ALBUMIN 4.0 06/03/2015 0835   AST 30 06/03/2015 0835   ALT 30* 06/03/2015 0835   ALKPHOS 90 06/03/2015 0835   BILITOT 0.4 06/03/2015 0835   GFRNONAA >60 02/16/2015 0644   GFRAA >60 02/16/2015 0644   Lab Results  Component Value Date   CHOL 121* 06/03/2015   HDL 41* 06/03/2015   LDLCALC 66 06/03/2015  TRIG 70 06/03/2015   Accessory Clinical Findings  Echocardiogram - 09/20/2013 - Left ventricle: The cavity size was normal. Wall thickness was normal. Systolic function was low normal to mildly reduced. The estimated ejection fraction was in the range of 50% to 55%. Wall motion was normal; there were no regional wall motion abnormalities. Doppler parameters are consistent with abnormal left ventricular relaxation (grade 1 diastolic dysfunction). - Aortic valve: There was no stenosis. - Mitral valve: Mildly calcified annulus. Normal thickness leaflets . Mild regurgitation. - Left atrium: The atrium was mildly dilated. - Right ventricle: The cavity size was normal. Systolic function was normal. - Tricuspid valve: Peak RV-RA gradient: 98m Hg (S). - Pulmonary arteries: PA peak pressure: 422mHg (S). - Systemic veins: IVC not visualized. Impressions:  - Normal LV size with low normal to mildly reduced systolic function, EF 5003-54%Normal RV size and systolic function. Mild MR. Mild pulmonary hypertension.  04/10/2014 Left ventricle: The cavity size was normal. Wall thickness was increased in a pattern of mild LVH. Systolic function was normal. The estimated ejection fraction was in the range of 55% to  60%. Wall motion was normal; there were no regional wall motion abnormalities. Doppler parameters are consistent with abnormal left ventricular relaxation (grade 1 diastolic dysfunction). - Pulmonary arteries: Systolic pressure was mildly increased. PA peak pressure: 36 mm Hg (S).  Impressions:  - Normal LV function; mild LVH; grade 1 diastolic dysfunction; mild TR; mildly elevated pulmonary pressure. Compared to 09/20/13, LV function remains normal.   Lexiscan Myoview: 09/21/2013 Impression  Exercise Capacity: Lexiscan with low level exercise.  BP Response: Normal blood pressure response.  Clinical Symptoms: No symptoms.  ECG Impression: There are scattered PVCs.  Comparison with Prior Nuclear Study: No previous nuclear study performed  Overall Impression: Low risk stress nuclear study with apical thinning defect and small area of inferolateral bowel artifact.  LV Ejection Fraction: 53%. LV Wall Motion: NL LV Function; NL Wall Motion  ECG - sinus rhythm, normal EKG.    Assessment & Plan  A pleasant [age]ear old female who underwent extensive workup for evaluation of dyspnea on exertion. A nuclear stress test showed normal ejection fraction no prior infarct and no ischemia. Normal ECG.  1.DOE - Echocardiogram showed preserved LV ejection fraction, grade 1 diastolic dysfunction and mild pulmonary hypertension with PA pressure 47 mmHg - she was started on amlodipine with significant symptoms improvement and decrease of her RVSP to 36 mmHg. Dyspnea on exertion- due to pulmonary hypertension, normal stress test. She is encouraged [facility].  2. Muscle pain with statins, borderline elevated LFTs, I will decrease simvastatin to 40 mg po daily.  3. Hypertension - controlled.  4. Hyperlipidemia - mildly elevated AST, all lipids at goal, muscle pain, decresae simvastatin to 40 mg po daily.  5. Obesity - advised on diet and  exercise.  Follow up in 1 year with CMP and lipids prior to the visit.    NEDorothy SparkMD, FASt. Elizabeth Hospital2/16/2016, 8:23 AM

## 2015-06-14 NOTE — Patient Instructions (Signed)
Medication Instructions:   DECREASE YOUR SIMVASTATIN TO 40 MG ONCE DAILY    Labwork:  PRIOR TO YOUR ONE YEAR FOLLOW-UP APPOINTMENT WITH DR Meda Coffee TO CHECK A ---CMET AND LIPIDS---PLEASE COME FASTING TO THIS LAB APPOINTMENT   Follow-Up:  Your physician wants you to follow-up in: [place] will receive a reminder letter in the mail two months in advance. If you don't receive a letter, please call our office to schedule the follow-up appointment.   PLEASE HAVE YOUR LABS DONE PRIOR TO THIS APPOINTMENT     If you need a refill on your cardiac medications before your next appointment, please call your pharmacy.

## 2015-06-29 ENCOUNTER — Other Ambulatory Visit: Payer: Self-pay | Admitting: Endocrinology

## 2015-06-30 DIAGNOSIS — D126 Benign neoplasm of colon, unspecified: Secondary | ICD-10-CM

## 2015-06-30 HISTORY — DX: Benign neoplasm of colon, unspecified: D12.6

## 2015-07-08 ENCOUNTER — Ambulatory Visit: Payer: Medicare Other | Admitting: Family Medicine

## 2015-07-15 ENCOUNTER — Other Ambulatory Visit: Payer: Self-pay

## 2015-07-15 MED ORDER — GLUCOSE BLOOD VI STRP: ORAL_STRIP | Status: DC

## 2015-07-16 ENCOUNTER — Ambulatory Visit: Payer: Medicare Other | Admitting: Family Medicine

## 2015-07-17 ENCOUNTER — Encounter: Payer: Self-pay | Admitting: Family Medicine

## 2015-07-17 ENCOUNTER — Ambulatory Visit (INDEPENDENT_AMBULATORY_CARE_PROVIDER_SITE_OTHER): Payer: Medicare Other | Admitting: Family Medicine

## 2015-07-17 VITALS — BP 102/70 | HR 98 | Ht 64.0 in | Wt 180.0 lb

## 2015-07-17 DIAGNOSIS — M170 Bilateral primary osteoarthritis of knee: Secondary | ICD-10-CM | POA: Diagnosis not present

## 2015-07-17 NOTE — Assessment & Plan Note (Signed)
Patient still has some mild degenerative changes. Has failed all other conservative therapy previously including formal physical therapy. We discussed this is a possibility again. Patient does not want to repeat this. He did elect to have the injections and tolerated them well. Patient will check her diabetes very close for the next 3 days. We discussed icing regimen. Patient will come back in 4 weeks. At that time if any worsening symptoms or no significant improvement we can consider repeating the Orthovisc. Patient was told to check with her insurance [employer]. She would've failed all conservative therapy and this was covered previously.

## 2015-07-17 NOTE — Progress Notes (Signed)
Pre visit review using our clinic review tool, if applicable. No additional management support is needed unless otherwise documented below in the visit note. 

## 2015-07-17 NOTE — Progress Notes (Signed)
  [name] [address], [place] [address] Phone: [phone] Subjective:     CC: Right knee pain f/u worsening left knee pain  [id number] Amy Escobar is a 70 y.o. female coming in with complaint of right knee pain. She continued to have knee pain and had internal derangement type symptoms. Patient continued to have pain so an MRI was ordered. Patient's MRI showed a radial tear of the posterior horn of the medial meniscus with peripheral meniscal extrusion.  Patient did respond well to Orthovisc. Patient was found to have an MCL tear previously. Patient is having pain actually with both knees again. Seems to be more similar to what pain she was having previously..patient is wondering if she can start the injections again if necessary.    Past medical history, social, surgical and family history all reviewed in electronic medical record.   Review of Systems: No headache, visual changes, nausea, vomiting, diarrhea, constipation, dizziness, abdominal pain, skin rash, fevers, chills, night sweats, weight loss, swollen lymph nodes, body aches, joint swelling, muscle aches, chest pain, shortness of breath, mood changes.   Objective Blood pressure 102/70, pulse 98, height 5\' 4"  (1.626 m), weight 180 lb (81.647 kg), SpO2 97 %.  General: No apparent distress alert and oriented x3 mood and affect normal, dressed appropriately.  HEENT: Pupils equal, extraocular movements intact  Respiratory: Patient's speak in full sentences and does not appear short of breath  Cardiovascular: No lower extremity edema, non tender, no erythema  Skin: Warm dry intact with no signs of infection or rash on extremities or on axial skeleton.  Abdomen: Soft nontender  Neuro: Cranial nerves II through XII are intact, neurovascularly intact in all extremities with 2+ DTRs and 2+ pulses.  Lymph: No lymphadenopathy of posterior or anterior cervical chain or axillae bilaterally.   Gait normal with good balance and coordination.  MSK:  Non tender with full range of motion and good stability and symmetric strength and tone of shoulders, elbows, wrist, hip, and ankles bilaterally.  Knee: Right Normal to inspection with no erythema or effusion or obvious bony abnormalities. Patient is moderately tender over the medial aspect of the right knee. ROM full in flexion and extension and lower leg rotation. Ligaments with solid consistent endpoints including ACL, PCL, LCL, LargeChips.pl with no pain Negative Mcmurray's, Apley's, and Thessalonian tests. Non painful patellar compression. Patellar glide without crepitus. Patellar and quadriceps tendons unremarkable. Hamstring and quadriceps strength is normal.  Contralateral knee tender to palpation of the medial compartment  After informed written and verbal consent, patient was seated on exam table. Right knee was prepped with alcohol swab and utilizing anterolateral approach, patient's right knee space was injected with 4:1  marcaine 0.5%: Kenalog 40mg /dL. Patient tolerated the procedure well without immediate complications.  After informed written and verbal consent, patient was seated on exam table. Left knee was prepped with alcohol swab and utilizing anterolateral approach, patient's left knee space was injected with 4:1  marcaine 0.5%: Kenalog 40mg /dL. Patient tolerated the procedure well without immediate complications.       Impression and Recommendations:

## 2015-07-17 NOTE — Patient Instructions (Signed)
Good to see you  Ice is your friend Stay active We will do injection today and see me again in 4 weeks and we could consider synvisc again.  Happy New Year!

## 2015-07-22 ENCOUNTER — Encounter: Payer: Self-pay | Admitting: Gastroenterology

## 2015-07-30 ENCOUNTER — Other Ambulatory Visit: Payer: Self-pay | Admitting: Cardiology

## 2015-08-09 ENCOUNTER — Encounter: Payer: Self-pay | Admitting: Gastroenterology

## 2015-08-09 ENCOUNTER — Ambulatory Visit (INDEPENDENT_AMBULATORY_CARE_PROVIDER_SITE_OTHER): Payer: Medicare Other | Admitting: Endocrinology

## 2015-08-09 VITALS — BP 126/80 | HR 76 | Wt 178.0 lb

## 2015-08-09 DIAGNOSIS — M810 Age-related osteoporosis without current pathological fracture: Secondary | ICD-10-CM | POA: Diagnosis not present

## 2015-08-09 DIAGNOSIS — K219 Gastro-esophageal reflux disease without esophagitis: Secondary | ICD-10-CM

## 2015-08-09 DIAGNOSIS — E1165 Type 2 diabetes mellitus with hyperglycemia: Secondary | ICD-10-CM | POA: Diagnosis not present

## 2015-08-09 DIAGNOSIS — Z0189 Encounter for other specified special examinations: Secondary | ICD-10-CM

## 2015-08-09 DIAGNOSIS — M109 Gout, unspecified: Secondary | ICD-10-CM | POA: Diagnosis not present

## 2015-08-09 DIAGNOSIS — Z0000 Encounter for general adult medical examination without abnormal findings: Secondary | ICD-10-CM

## 2015-08-09 DIAGNOSIS — IMO0001 Reserved for inherently not codable concepts without codable children: Secondary | ICD-10-CM

## 2015-08-09 LAB — LIPID PANEL
CHOL/HDL RATIO: 3
Cholesterol: 157 mg/dL (ref 0–200)
HDL: 55 mg/dL (ref >39.00)
LDL Cholesterol: 87 mg/dL (ref 0–99)
NonHDL: 102.49
TRIGLYCERIDES: 76 mg/dL (ref 0.0–149.0)
VLDL: 15.2 mg/dL (ref 0.0–40.0)

## 2015-08-09 LAB — BASIC METABOLIC PANEL
BUN: 19 mg/dL (ref 6–23)
CALCIUM: 9.6 mg/dL (ref 8.4–10.5)
CO2: 31 meq/L (ref 19–32)
CREATININE: 0.89 mg/dL (ref 0.40–1.20)
Chloride: 103 mEq/L (ref 96–112)
GFR: 80.63 mL/min (ref >60.00)
GLUCOSE: 103 mg/dL — AB (ref 70–99)
POTASSIUM: 3.9 meq/L (ref 3.5–5.1)
SODIUM: 140 meq/L (ref 135–145)

## 2015-08-09 LAB — URINALYSIS, ROUTINE W REFLEX MICROSCOPIC
BILIRUBIN URINE: NEGATIVE
Hgb urine dipstick: NEGATIVE
KETONES UR: NEGATIVE
LEUKOCYTES UA: NEGATIVE
Nitrite: NEGATIVE
PH: 6 (ref 5.0–8.0)
Specific Gravity, Urine: 1.025 (ref 1.000–1.030)
Total Protein, Urine: NEGATIVE
URINE GLUCOSE: NEGATIVE
UROBILINOGEN UA: 0.2 (ref 0.0–1.0)

## 2015-08-09 LAB — HEPATIC FUNCTION PANEL
ALT: 24 U/L (ref 0–35)
AST: 22 U/L (ref 0–37)
Albumin: 4 g/dL (ref 3.5–5.2)
Alkaline Phosphatase: 86 U/L (ref 39–117)
Bilirubin, Direct: 0.1 mg/dL (ref 0.0–0.3)
TOTAL PROTEIN: 7.5 g/dL (ref 6.0–8.3)
Total Bilirubin: 0.4 mg/dL (ref 0.2–1.2)

## 2015-08-09 LAB — TSH: TSH: 1.38 u[IU]/mL (ref 0.35–4.50)

## 2015-08-09 LAB — MICROALBUMIN / CREATININE URINE RATIO
Creatinine,U: 139 mg/dL
MICROALB/CREAT RATIO: 0.5 mg/g (ref 0.0–30.0)
Microalb, Ur: <0.7 mg/dL (ref 0.0–1.9)

## 2015-08-09 LAB — POCT GLYCOSYLATED HEMOGLOBIN (HGB A1C): HEMOGLOBIN A1C: 5.7

## 2015-08-09 NOTE — Progress Notes (Signed)
Subjective:    Patient ID: Amy Escobar, female    DOB: 1946-02-09, 70 y.o.   MRN: [id number]  HPI Pt is here for regular wellness examination, and is feeling pretty well in general, and says chronic med probs are stable, except as noted below Past Medical History  Diagnosis Date  . Hyperlipidemia   . DM2 (diabetes mellitus, type 2) ([place])   . Hypertension   . Allergic rhinitis   . GERD (gastroesophageal reflux disease)   . Osteoporosis   . Uterine fibroid   . Alkaline phosphatase deficiency     w/u Ne  . Dyslipidemia   . Thyroid nodule     small  . Gout   . Anxiety   . Hemorrhoids     Past Surgical History  Procedure Laterality Date  . 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 Stress cardiolite  02/12/2006  . Echocardiogram (other)  01/16/2002  . Tubal ligation    . Toenail excision      Social History   Social History  . Marital Status: Widowed    Spouse Name: N/A  . Number of Children: 2  . 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 Smoking status: Former Smoker -- 0.25 packs/day for 5 years    Types: Cigarettes    Quit date: 06/29/1968  . Smokeless tobacco: Never Used  . Alcohol Use: No  . Drug Use: No  . Sexual Activity: Not on file   Other Topics Concern  . Not on file   Social History Narrative   Widowed 2010.     Current Outpatient Prescriptions on File Prior to Visit  Medication Sig Dispense Refill  . amLODipine (NORVASC) 2.5 MG tablet TAKE 1 TABLET BY MOUTH DAILY 90 tablet 2  . aspirin EC 81 MG tablet Take 81 mg by mouth daily.    . Blood Glucose Monitoring Suppl (ONE TOUCH ULTRA MINI) W/DEVICE KIT 1 Device by Does not apply route once. 1 each 0  . gabapentin (NEURONTIN) 600 MG tablet Take 1 tablet (600 mg total) by mouth 3 (three) times daily. 90 tablet 11  . glucose blood (ONE TOUCH ULTRA TEST) test strip TEST BLOOD SUGAR THREE TIMES A DAY 100 each 2  . metFORMIN (GLUCOPHAGE-XR)  500 MG 24 hr tablet Take 2 tablets (1,000 mg total) by mouth 2 (two) times daily. 4 tabs daily 120 tablet 2  . NEXIUM 40 MG capsule TAKE 1 CAPSULE (40 MG TOTAL) BY MOUTH DAILY BEFORE BREAKFAST. 90 capsule 8  . ONETOUCH DELICA LANCETS 77A MISC USE AS DIRECTED BY PRESCRIBER THREE TIMES A DAY 300 each 9  . simvastatin (ZOCOR) 40 MG tablet Take 1 tablet (40 mg total) by mouth at bedtime. 90 tablet 11  . traMADol (ULTRAM) 50 MG tablet Take 1 tablet (50 mg total) by mouth every 8 (eight) hours as needed. 30 tablet 0  . valsartan-hydrochlorothiazide (DIOVAN-HCT) 160-12.5 MG tablet TAKE 1 TABLET BY MOUTH DAILY 30 tablet 0   No current facility-administered medications on file prior to visit.    Allergies  Allergen Reactions  . Olmesartan Medoxomil     REACTION: headache    Family History  Problem Relation Age of Onset  . Heart disease Mother   . Heart disease Maternal Grandfather   . Rectal cancer Maternal Grandfather   . Stomach cancer Maternal Grandmother   . Heart disease Father     BP  126/80 mmHg  Pulse 76  Wt 178 lb (80.74 kg)  SpO2 98%  Review of Systems  Constitutional: Negative for fever.  HENT: Negative for hearing loss.   Eyes: Negative for visual disturbance.  Respiratory: Negative for shortness of breath.   Cardiovascular: Negative for chest pain.  Gastrointestinal: Negative for anal bleeding.  Endocrine: Negative for cold intolerance.  Genitourinary: Negative for hematuria.  Musculoskeletal: Negative for back pain.  Skin: Negative for rash.  Allergic/Immunologic: Positive for environmental allergies.  Neurological: Negative for syncope.  Hematological: Does not bruise/bleed easily.  Psychiatric/Behavioral: Negative for dysphoric mood.       Objective:   Physical Exam VS: see vs page GEN: no distress HEAD: head: no deformity eyes: no periorbital swelling, no proptosis external nose and ears are normal mouth: no lesion seen NECK: supple, thyroid is not  enlarged CHEST WALL: no deformity LUNGS:  Clear to auscultation CV: reg rate and rhythm, no murmur ABD: abdomen is soft, nontender.  no hepatosplenomegaly.  not distended.  no hernia MUSCULOSKELETAL: muscle bulk and strength are grossly normal.  no obvious joint swelling.  gait is normal and steady EXTEMITIES: no deformity.  no ulcer on the feet.  feet are of normal color and temp.  no edema PULSES: dorsalis pedis intact bilat.  no carotid bruit NEURO:  cn 2-12 grossly intact.   readily moves all 4's.  sensation is intact to touch on the feet SKIN:  Normal texture and temperature.  No rash or suspicious lesion is visible.   NODES:  None palpable at the neck PSYCH: alert, well-oriented.  Does not appear anxious nor depressed.        Assessment & Plan:  Wellness visit today, with problems stable, except as noted.  Patient is advised the following: Patient Instructions  You can stop taking the pioglitizone pill.   please consider these measures for your health:  minimize alcohol.  do not use tobacco products.  have a colonoscopy at least every 10 years from age 63.  Women should have an annual mammogram from age 38.  keep firearms safely stored.  always use seat belts.  have working smoke alarms in your home.  see an eye doctor and dentist regularly.  never drive under the influence of alcohol or drugs (including prescription drugs).   it is critically important to prevent falling down (keep floor areas well-lit, dry, and free of loose objects.  If you have a cane, walker, or wheelchair, you should use it, even for short trips around the house.  Also, try not to rush).   Please come back for a follow-up appointment in 6 months.

## 2015-08-09 NOTE — Patient Instructions (Addendum)
You can stop taking the pioglitizone pill.   please consider these measures for your health:  minimize alcohol.  do not use tobacco products.  have a colonoscopy at least every 10 years from age 70.  Women should have an annual mammogram from age 56.  keep firearms safely stored.  always use seat belts.  have working smoke alarms in your home.  see an eye doctor and dentist regularly.  never drive under the influence of alcohol or drugs (including prescription drugs).   it is critically important to prevent falling down (keep floor areas well-lit, dry, and free of loose objects.  If you have a cane, walker, or wheelchair, you should use it, even for short trips around the house.  Also, try not to rush).   Please come back for a follow-up appointment in 6 months.

## 2015-08-09 NOTE — Progress Notes (Signed)
we discussed code status.  pt requests full code, but would not want to be started or maintained on artificial life-support measures if there was not a reasonable chance of recovery 

## 2015-08-11 LAB — CBC WITH DIFFERENTIAL/PLATELET
BASOS ABS: 0 10*3/uL (ref 0.0–0.1)
Basophils Relative: 0.3 % (ref 0.0–3.0)
EOS ABS: 0.2 10*3/uL (ref 0.0–0.7)
Eosinophils Relative: 3.3 % (ref 0.0–5.0)
HEMATOCRIT: 41.9 % (ref 36.0–46.0)
HEMOGLOBIN: 12.3 g/dL (ref 12.0–15.0)
LYMPHS PCT: 34.1 % (ref 12.0–46.0)
Lymphs Abs: 2.1 10*3/uL (ref 0.7–4.0)
MCHC: 29.4 g/dL — ABNORMAL LOW (ref 30.0–36.0)
MCV: 93.9 fl (ref 78.0–100.0)
Monocytes Absolute: 0.7 10*3/uL (ref 0.1–1.0)
Monocytes Relative: 11.7 % (ref 3.0–12.0)
Neutro Abs: 3.1 10*3/uL (ref 1.4–7.7)
Neutrophils Relative %: 50.6 % (ref 43.0–77.0)
PLATELETS: 224 10*3/uL (ref 150.0–400.0)
RBC: 4.46 Mil/uL (ref 3.87–5.11)
RDW: 15.7 % — ABNORMAL HIGH (ref 11.5–15.5)
WBC: 6.1 10*3/uL (ref 4.0–10.5)

## 2015-08-13 ENCOUNTER — Other Ambulatory Visit: Payer: Self-pay | Admitting: Endocrinology

## 2015-08-13 DIAGNOSIS — H33101 Unspecified retinoschisis, right eye: Secondary | ICD-10-CM | POA: Diagnosis not present

## 2015-08-13 DIAGNOSIS — E119 Type 2 diabetes mellitus without complications: Secondary | ICD-10-CM | POA: Diagnosis not present

## 2015-08-13 DIAGNOSIS — Z0000 Encounter for general adult medical examination without abnormal findings: Secondary | ICD-10-CM

## 2015-08-13 DIAGNOSIS — H43813 Vitreous degeneration, bilateral: Secondary | ICD-10-CM | POA: Diagnosis not present

## 2015-08-13 DIAGNOSIS — H31009 Unspecified chorioretinal scars, unspecified eye: Secondary | ICD-10-CM | POA: Diagnosis not present

## 2015-08-14 ENCOUNTER — Ambulatory Visit (INDEPENDENT_AMBULATORY_CARE_PROVIDER_SITE_OTHER): Payer: Medicare Other | Admitting: Family Medicine

## 2015-08-14 ENCOUNTER — Encounter: Payer: Self-pay | Admitting: Family Medicine

## 2015-08-14 VITALS — BP 114/72 | HR 89 | Ht 64.0 in | Wt 180.0 lb

## 2015-08-14 DIAGNOSIS — M170 Bilateral primary osteoarthritis of knee: Secondary | ICD-10-CM

## 2015-08-14 NOTE — Addendum Note (Signed)
Addended by: Lyndal Pulley on: 08/14/2015 08:29 AM   Modules accepted: Level of Service

## 2015-08-14 NOTE — Progress Notes (Signed)
Pre visit review using our clinic review tool, if applicable. No additional management support is needed unless otherwise documented below in the visit note. 

## 2015-08-14 NOTE — Patient Instructions (Addendum)
Good to see you Ice is your friend Stay active ( I know the grand babbies will keep you doing that) We will see you again in 1 week for second injections.

## 2015-08-14 NOTE — Assessment & Plan Note (Signed)
Patient was started on viscous supple mentation in the knees bilaterally centered underlying arthritis. Failed all other conservative therapy at this point. Had responded previously to his series in her right knee. I'm hoping the patient will do well. Patient will come back in 1 week for second injection in series of 3. Continue icing other conservative therapies.  Spent  25 minutes with patient face-to-face and had greater than 50% of counseling including as described above in assessment and plan.

## 2015-08-14 NOTE — Progress Notes (Addendum)
  [facility] [place], [place] [address] Phone: [phone] Subjective:     CC: Right knee pain f/u worsening left knee pain  [id number] Amy Escobar is a 70 y.o. female coming in with complaint of right knee pain. She continued to have knee pain and had internal derangement type symptoms. Patient continued to have pain so an MRI was ordered. Patient's MRI showed a radial tear of the posterior horn of the medial meniscus with peripheral meniscal extrusion.  Patient did respond well to Synvisc.  Less than this wasn't done was guided in 6 months ago. Patient one month ago did have steroid injections in the knees bilaterally. Patient states steroid injections did help somewhat but continues to have pain then can affect some daily activities. No some swelling. No catching or locking on her but does feel like she has some instability from time to time.  X-rays previously taken in 2015 showed mild to moderate osteophytic changes mostly of the medial compartment.  Past medical history, social, surgical and family history all reviewed in electronic medical record.   Review of Systems: No headache, visual changes, nausea, vomiting, diarrhea, constipation, dizziness, abdominal pain, skin rash, fevers, chills, night sweats, weight loss, swollen lymph nodes, body aches, joint swelling, muscle aches, chest pain, shortness of breath, mood changes.   Objective Blood pressure 114/72, pulse 89, height 5\' 4"  (1.626 m), weight 180 lb (81.647 kg), SpO2 99 %.  General: No apparent distress alert and oriented x3 mood and affect normal, dressed appropriately.  HEENT: Pupils equal, extraocular movements intact  Respiratory: Patient's speak in full sentences and does not appear short of breath  Cardiovascular: No lower extremity edema, non tender, no erythema  Skin: Warm dry intact with no signs of infection or rash on extremities or on axial skeleton.  Abdomen:  Soft nontender  Neuro: Cranial nerves II through XII are intact, neurovascularly intact in all extremities with 2+ DTRs and 2+ pulses.  Lymph: No lymphadenopathy of posterior or anterior cervical chain or axillae bilaterally.  Gait normal with good balance and coordination.  MSK:  Non tender with full range of motion and good stability and symmetric strength and tone of shoulders, elbows, wrist, hip, and ankles bilaterally.  Knee: Bilateral Normal to inspection with no erythema or effusion or obvious bony abnormalities. Patient is moderately tender over the medial aspect of the right knee. ROM full in flexion and extension and lower leg rotation. Ligaments with solid consistent endpoints including ACL, PCL, LCL, LargeChips.pl with no pain Negative Mcmurray's, Apley's, and Thessalonian tests. Non painful patellar compression. Patellar glide without crepitus. Patellar and quadriceps tendons unremarkable. Hamstring and quadriceps strength is normal.    After informed written and verbal consent, patient was seated on exam table. Right knee was prepped with alcohol swab and utilizing anterolateral approach, patient's right knee space was injected with 16 mg/2.5 mL of Synvisc (sodium hyaluronate) in a prefilled syringe was injected easily into the knee through a 22-gauge needle.. Patient tolerated the procedure well without immediate complications.  After informed written and verbal consent, patient was seated on exam table. Left knee was prepped with alcohol swab and utilizing anterolateral approach, patient's left knee space was injected with 16 mg/2.5 mL of Synvisc (sodium hyaluronate) in a prefilled syringe was injected easily into the knee through a 22-gauge needle.. Patient tolerated the procedure well without immediate complications.       Impression and Recommendations:

## 2015-08-15 ENCOUNTER — Other Ambulatory Visit: Payer: Self-pay

## 2015-08-15 DIAGNOSIS — Z1231 Encounter for screening mammogram for malignant neoplasm of breast: Secondary | ICD-10-CM

## 2015-08-18 ENCOUNTER — Telehealth: Payer: Self-pay

## 2015-08-18 NOTE — Telephone Encounter (Signed)
TC to client.  REports that she has been taken off her Actos and her A1C has been around 5 for last two times at her MD.  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  Client praised and encouraged to continue diet and healthy eating to keep blood sugar down,   Will followup as needed.

## 2015-08-21 ENCOUNTER — Ambulatory Visit (INDEPENDENT_AMBULATORY_CARE_PROVIDER_SITE_OTHER): Payer: Medicare Other | Admitting: Family Medicine

## 2015-08-21 ENCOUNTER — Encounter: Payer: Self-pay | Admitting: Family Medicine

## 2015-08-21 VITALS — BP 92/68 | HR 98 | Ht 64.0 in | Wt 179.0 lb

## 2015-08-21 DIAGNOSIS — M170 Bilateral primary osteoarthritis of knee: Secondary | ICD-10-CM | POA: Diagnosis not present

## 2015-08-21 NOTE — Patient Instructions (Signed)
Good to see yo u Amy Escobar is your friend That was 2nd injection and we will see you in 1 week for the final injections!!!

## 2015-08-21 NOTE — Progress Notes (Signed)
  [name] [place], [place] [address] Phone: [phone] Subjective:     CC: Bilateral knee pain follow-up  [id number] Amy Escobar is a 70 y.o. female coming in with complaint of bilateral knee pain. 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 Patient has started the viscous supplementation. Patient is here for second injections in both knees and a series of 3. States that she is noticing attending 20% improvement. Not is having as much instability. No worsening of any symptoms.  X-rays previously taken in 2015 showed mild to moderate osteophytic changes mostly of the medial compartment.  Past medical history, social, surgical and family history all reviewed in electronic medical record.   Review of Systems: No headache, visual changes, nausea, vomiting, diarrhea, constipation, dizziness, abdominal pain, skin rash, fevers, chills, night sweats, weight loss, swollen lymph nodes, body aches, joint swelling, muscle aches, chest pain, shortness of breath, mood changes.   Objective Blood pressure 92/68, pulse 98, height 5\' 4"  (1.626 m), weight 179 lb (81.194 kg), SpO2 99 %.  General: No apparent distress alert and oriented x3 mood and affect normal, dressed appropriately.  HEENT: Pupils equal, extraocular movements intact  Respiratory: Patient's speak in full sentences and does not appear short of breath  Cardiovascular: No lower extremity edema, non tender, no erythema  Skin: Warm dry intact with no signs of infection or rash on extremities or on axial skeleton.  Abdomen: Soft nontender  Neuro: Cranial nerves II through XII are intact, neurovascularly intact in all extremities with 2+ DTRs and 2+ pulses.  Lymph: No lymphadenopathy of posterior or anterior cervical chain or axillae bilaterally.  Gait normal with good balance and coordination.  MSK:  Non tender with full range of motion and good  stability and symmetric strength and tone of shoulders, elbows, wrist, hip, and ankles bilaterally.  Knee: Bilateral Normal to inspection with no erythema or effusion or obvious bony abnormalities. Patient is moderately tender over the medial aspect of the right knee. ROM full in flexion and extension and lower leg rotation. Ligaments with solid consistent endpoints including ACL, PCL, LCL, LargeChips.pl with no pain Negative Mcmurray's, Apley's, and Thessalonian tests. Non painful patellar compression. Patellar glide without crepitus. Patellar and quadriceps tendons unremarkable. Hamstring and quadriceps strength is normal.  No change from previous exam  After informed written and verbal consent, patient was seated on exam table. Right knee was prepped with alcohol swab and utilizing anterolateral approach, patient's right knee space was injected with 16 mg/2.5 mL of Synvisc (sodium hyaluronate) in a prefilled syringe was injected easily into the knee through a 22-gauge needle.. Patient tolerated the procedure well without immediate complications.  After informed written and verbal consent, patient was seated on exam table. Left knee was prepped with alcohol swab and utilizing anterolateral approach, patient's left knee space was injected with 16 mg/2.5 mL of Synvisc (sodium hyaluronate) in a prefilled syringe was injected easily into the knee through a 22-gauge needle.. Patient tolerated the procedure well without immediate complications.       Impression and Recommendations:

## 2015-08-21 NOTE — Progress Notes (Signed)
Pre visit review using our clinic review tool, if applicable. No additional management support is needed unless otherwise documented below in the visit note. 

## 2015-08-21 NOTE — Assessment & Plan Note (Signed)
Second in a series of 3 injections given today. Continue conservative therapy. Follow-up in one week for her to an final injections.

## 2015-08-23 ENCOUNTER — Telehealth: Payer: Self-pay | Admitting: Endocrinology

## 2015-08-23 ENCOUNTER — Ambulatory Visit (INDEPENDENT_AMBULATORY_CARE_PROVIDER_SITE_OTHER)
Admission: RE | Admit: 2015-08-23 | Discharge: 2015-08-23 | Disposition: A | Discharge status: Home / Self Care | Payer: Medicare Other | Source: Ambulatory Visit | Attending: Endocrinology | Admitting: Endocrinology

## 2015-08-23 DIAGNOSIS — M810 Age-related osteoporosis without current pathological fracture: Secondary | ICD-10-CM

## 2015-08-23 NOTE — Telephone Encounter (Signed)
For diabetic shoes, you need 1 of these: Deformed foot Amputation Ulcer Poor circulation  the sellers of diabetic shoes tend to be very aggressive, but fortunately, you do not have these medial conditions

## 2015-08-23 NOTE — Telephone Encounter (Signed)
Spoke with patient and she wanted to know details on why she does not qualify for diabetic shoes. Please advise    KP

## 2015-08-23 NOTE — Telephone Encounter (Signed)
please call patient: Sorry, but you do not qualify for diabetic shoes.

## 2015-08-26 NOTE — Telephone Encounter (Signed)
I contacted the pt and advised of note below and voiced understanding.  

## 2015-08-28 ENCOUNTER — Ambulatory Visit: Payer: Medicare Other | Admitting: Family Medicine

## 2015-09-02 ENCOUNTER — Ambulatory Visit
Admission: RE | Admit: 2015-09-02 | Discharge: 2015-09-02 | Disposition: A | Discharge status: Home / Self Care | Payer: Medicare Other | Source: Ambulatory Visit

## 2015-09-02 DIAGNOSIS — Z1231 Encounter for screening mammogram for malignant neoplasm of breast: Secondary | ICD-10-CM | POA: Diagnosis not present

## 2015-09-04 ENCOUNTER — Encounter: Payer: Self-pay | Admitting: Family Medicine

## 2015-09-04 ENCOUNTER — Ambulatory Visit (INDEPENDENT_AMBULATORY_CARE_PROVIDER_SITE_OTHER): Payer: Medicare Other | Admitting: Family Medicine

## 2015-09-04 VITALS — BP 104/72 | HR 88 | Wt 179.0 lb

## 2015-09-04 DIAGNOSIS — M170 Bilateral primary osteoarthritis of knee: Secondary | ICD-10-CM | POA: Diagnosis not present

## 2015-09-04 NOTE — Assessment & Plan Note (Signed)
Finished   Series of 3 injections of the Synvisc today. We discussed icing regimen. We discussed continuing conservative therapy. Patient will follow-up in 4-6 weeks.

## 2015-09-04 NOTE — Patient Instructions (Signed)
You are doing great! Get a break from me Stay active and ice is good See em again in 4-6 weeks.

## 2015-09-04 NOTE — Progress Notes (Signed)
  [facility] [place], [place] [address] Phone: [phone] Subjective:     CC: Bilateral knee pain follow-up  [id number] Amy Escobar is a 70 y.o. female coming in with complaint of bilateral knee pain. 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 Patient has started the viscous supplementation. Patient is here for 3rd injections in both knees and a series of 3. States  Continues to improve overall. Did have a flare after the last injections at last 24 hours.  X-rays previously taken in 2015 showed mild to moderate osteophytic changes mostly of the medial compartment.  Past medical history, social, surgical and family history all reviewed in electronic medical record.   Review of Systems: No headache, visual changes, nausea, vomiting, diarrhea, constipation, dizziness, abdominal pain, skin rash, fevers, chills, night sweats, weight loss, swollen lymph nodes, body aches, joint swelling, muscle aches, chest pain, shortness of breath, mood changes.   Objective Blood pressure 104/72, pulse 88, weight 179 lb (81.194 kg), SpO2 97 %.  General: No apparent distress alert and oriented x3 mood and affect normal, dressed appropriately.  HEENT: Pupils equal, extraocular movements intact  Respiratory: Patient's speak in full sentences and does not appear short of breath  Cardiovascular: No lower extremity edema, non tender, no erythema  Skin: Warm dry intact with no signs of infection or rash on extremities or on axial skeleton.  Abdomen: Soft nontender  Neuro: Cranial nerves II through XII are intact, neurovascularly intact in all extremities with 2+ DTRs and 2+ pulses.  Lymph: No lymphadenopathy of posterior or anterior cervical chain or axillae bilaterally.  Gait normal with good balance and coordination.  MSK:  Non tender with full range of motion and good stability and symmetric strength and tone of  shoulders, elbows, wrist, hip, and ankles bilaterally.  Knee: Bilateral Normal to inspection with no erythema or effusion or obvious bony abnormalities. Patient is moderately tender over the medial aspect of the right knee. ROM full in flexion and extension and lower leg rotation. Ligaments with solid consistent endpoints including ACL, PCL, LCL, LargeChips.pl with no pain Negative Mcmurray's, Apley's, and Thessalonian tests. Non painful patellar compression. Patellar glide without crepitus. Patellar and quadriceps tendons unremarkable. Hamstring and quadriceps strength is normal.  No change from previous exam  After informed written and verbal consent, patient was seated on exam table. Right knee was prepped with alcohol swab and utilizing anterolateral approach, patient's right knee space was injected with 16 mg/2.5 mL of Synvisc (sodium hyaluronate) in a prefilled syringe was injected easily into the knee through a 22-gauge needle.. Patient tolerated the procedure well without immediate complications.  After informed written and verbal consent, patient was seated on exam table. Left knee was prepped with alcohol swab and utilizing anterolateral approach, patient's left knee space was injected with 16 mg/2.5 mL of Synvisc (sodium hyaluronate) in a prefilled syringe was injected easily into the knee through a 22-gauge needle.. Patient tolerated the procedure well without immediate complications.       Impression and Recommendations:

## 2015-09-05 ENCOUNTER — Ambulatory Visit: Payer: Medicare Other | Admitting: Family Medicine

## 2015-09-09 ENCOUNTER — Other Ambulatory Visit: Payer: Self-pay | Admitting: Endocrinology

## 2015-09-09 ENCOUNTER — Telehealth: Payer: Self-pay | Admitting: Endocrinology

## 2015-09-09 DIAGNOSIS — R928 Other abnormal and inconclusive findings on diagnostic imaging of breast: Secondary | ICD-10-CM

## 2015-09-09 NOTE — Telephone Encounter (Signed)
Please see below.

## 2015-09-09 NOTE — Telephone Encounter (Signed)
Team health note dated 09/08/15 Caller is Fraser Din from [facility] [phone] clarification on order for lidocaine ointment.

## 2015-09-09 NOTE — Telephone Encounter (Signed)
i don't see order.  Is it under meds?  Did I prescribe it?

## 2015-09-10 NOTE — Telephone Encounter (Signed)
i can send a prescription, but don't you see Dr Tamala Julian for this?

## 2015-09-10 NOTE — Telephone Encounter (Signed)
I don't see that it's ever been prescribed, maybe something new that they want to use?

## 2015-09-11 ENCOUNTER — Other Ambulatory Visit: Payer: Self-pay

## 2015-09-11 ENCOUNTER — Other Ambulatory Visit: Payer: Self-pay | Admitting: Endocrinology

## 2015-09-11 DIAGNOSIS — R928 Other abnormal and inconclusive findings on diagnostic imaging of breast: Secondary | ICD-10-CM

## 2015-09-12 ENCOUNTER — Ambulatory Visit
Admission: RE | Admit: 2015-09-12 | Discharge: 2015-09-12 | Disposition: A | Discharge status: Home / Self Care | Payer: Medicare Other | Source: Ambulatory Visit | Attending: Endocrinology | Admitting: Endocrinology

## 2015-09-12 ENCOUNTER — Ambulatory Visit (INDEPENDENT_AMBULATORY_CARE_PROVIDER_SITE_OTHER): Payer: Medicare Other | Admitting: Endocrinology

## 2015-09-12 ENCOUNTER — Encounter: Payer: Self-pay | Admitting: Endocrinology

## 2015-09-12 ENCOUNTER — Telehealth: Payer: Self-pay | Admitting: Endocrinology

## 2015-09-12 VITALS — BP 114/72 | HR 80 | Temp 97.8°F | Ht 64.0 in | Wt 183.2 lb

## 2015-09-12 DIAGNOSIS — R05 Cough: Secondary | ICD-10-CM

## 2015-09-12 DIAGNOSIS — R928 Other abnormal and inconclusive findings on diagnostic imaging of breast: Secondary | ICD-10-CM | POA: Diagnosis not present

## 2015-09-12 DIAGNOSIS — R059 Cough, unspecified: Secondary | ICD-10-CM

## 2015-09-12 MED ORDER — BENZONATATE 100 MG PO CAPS: 100.0000 mg | ORAL_CAPSULE | Freq: Three times a day (TID) | ORAL | Status: DC | PRN

## 2015-09-12 MED ORDER — AZITHROMYCIN 500 MG PO TABS: 500.0000 mg | ORAL_TABLET | Freq: Every day | ORAL | Status: DC

## 2015-09-12 NOTE — Telephone Encounter (Signed)
Please come here after your appt at [facility] center

## 2015-09-12 NOTE — Telephone Encounter (Signed)
Please see below.

## 2015-09-12 NOTE — Telephone Encounter (Signed)
Patient called this morning stating that she is not feeling well  Amy Escobar has a 9:00 am appointment at the breast center and would like to know if she can be see? If she can not be seen, patient would like to know if you could call her in a Z-Pak   Please advise    Thank you

## 2015-09-12 NOTE — Progress Notes (Signed)
Subjective:    Patient ID: Amy Escobar, female    DOB: 22-May-1946, 70 y.o.   MRN: [id number]  HPI Pt states 2 days of moderate prod cough in the chest, and assoc pain.  No nasal congestion.   Past Medical History  Diagnosis Date  . Hyperlipidemia   . DM2 (diabetes mellitus, type 2) ([place])   . Hypertension   . Allergic rhinitis   . GERD (gastroesophageal reflux disease)   . Osteoporosis   . Uterine fibroid   . Alkaline phosphatase deficiency     w/u Ne  . Dyslipidemia   . Thyroid nodule     small  . Gout   . Anxiety   . Hemorrhoids     Past Surgical History  Procedure Laterality Date  . 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 Stress cardiolite  02/12/2006  . Echocardiogram (other)  01/16/2002  . Tubal ligation    . Toenail excision      Social History   Social History  . Marital Status: Widowed    Spouse Name: N/A  . Number of Children: 2  . 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 Smoking status: Former Smoker -- 0.25 packs/day for 5 years    Types: Cigarettes    Quit date: 06/29/1968  . Smokeless tobacco: Never Used  . Alcohol Use: No  . Drug Use: No  . Sexual Activity: Not on file   Other Topics Concern  . Not on file   Social History Narrative   Widowed 2010.     Current Outpatient Prescriptions on File Prior to Visit  Medication Sig Dispense Refill  . amLODipine (NORVASC) 2.5 MG tablet TAKE 1 TABLET BY MOUTH DAILY 90 tablet 2  . aspirin EC 81 MG tablet Take 81 mg by mouth daily.    . Blood Glucose Monitoring Suppl (ONE TOUCH ULTRA MINI) W/DEVICE KIT 1 Device by Does not apply route once. 1 each 0  . gabapentin (NEURONTIN) 600 MG tablet TAKE 1 TABLET (600 MG TOTAL) BY MOUTH 3 (THREE) TIMES DAILY. 270 tablet 10  . glucose blood (ONE TOUCH ULTRA TEST) test strip TEST BLOOD SUGAR THREE TIMES A DAY 100 each 2  . metFORMIN (GLUCOPHAGE-XR) 500 MG 24 hr tablet Take 2 tablets (1,000 mg total)  by mouth 2 (two) times daily. 4 tabs daily 120 tablet 2  . NEXIUM 40 MG capsule TAKE 1 CAPSULE (40 MG TOTAL) BY MOUTH DAILY BEFORE BREAKFAST. 90 capsule 8  . ONETOUCH DELICA LANCETS 83G MISC USE AS DIRECTED BY PRESCRIBER THREE TIMES A DAY 300 each 9  . simvastatin (ZOCOR) 40 MG tablet Take 1 tablet (40 mg total) by mouth at bedtime. 90 tablet 11  . valsartan-hydrochlorothiazide (DIOVAN-HCT) 160-12.5 MG tablet TAKE 1 TABLET BY MOUTH DAILY 30 tablet 0   No current facility-administered medications on file prior to visit.    Allergies  Allergen Reactions  . Olmesartan Medoxomil     REACTION: headache    Family History  Problem Relation Age of Onset  . Heart disease Mother   . Heart disease Maternal Grandfather   . Rectal cancer Maternal Grandfather   . Stomach cancer Maternal Grandmother   . Heart disease Father     BP 114/72 mmHg  Pulse 80  Temp(Src) 97.8 F (36.6 C) (Oral)  Ht '5\' 4"'  (1.626 m)  Wt 183 lb 3.2 oz (83.099 kg)  BMI 31.43  kg/m2  SpO2 97%  Review of Systems Denies wheezing and fever.     Objective:   Physical Exam VITAL SIGNS:  See vs page GENERAL: no distress head: no deformity eyes: no periorbital swelling, no proptosis external nose and ears are normal mouth: no lesion seen Ears: both tm's are red LUNGS:  Clear to auscultation      Assessment & Plan:  URI: new  Patient is advised the following: Patient Instructions  i have sent 2 prescriptions to your pharmacy:  for the antibiotic pill and cough. A chest x-ray is requested for you today.  We'll let you know about the results. I hope you feel better soon.  If you don't feel better by next week, please call back.  Please call sooner if you get worse.

## 2015-09-12 NOTE — Patient Instructions (Addendum)
i have sent 2 prescriptions to your pharmacy:  for the antibiotic pill and cough. A chest x-ray is requested for you today.  We'll let you know about the results. I hope you feel better soon.  If you don't feel better by next week, please call back.  Please call sooner if you get worse.

## 2015-09-15 DIAGNOSIS — Z711 Person with feared health complaint in whom no diagnosis is made: Secondary | ICD-10-CM

## 2015-09-16 ENCOUNTER — Telehealth: Payer: Self-pay | Admitting: Cardiology

## 2015-09-16 NOTE — Telephone Encounter (Signed)
Pt calling to report that she has complaints of abdominal swelling only, since she has started receiving steroids in her joints for osteoarthritis.  Pt states she has no LEE, no sob, no DOE, no chest pain, no palpitations, dizziness, pre-syncopal or syncopal episodes.  Pt has no cardiac complaints at all.  Pt states she feels like she's "6 months pregnant." Pt complains of no N/V.  Pt has a mild history of abnormal liver function, but her last hepatic panel done in Feb 2016, was normal.  Pt had full labs done in 2/16, including lipids and urinalysis, and all labs looked normal per Dr Loanne Drilling.  Pt states she was recently started on some antibiotics for an URI, and that's really the only new change in meds she has.  Pt did say that she has diabetes, which can cause her great complications at time. Pt states she is due for her routine colonoscopy for early April, and will be doing the pre-op visit this Thursday 3/23 for work-up of this test.  Pt reports she has normal bowel movements, with normal appearance.  Pt states she is urinating appropriately, and her urine has no foul odor and normal appearance. Pt just states "I can't understand why I'm gaining weight in my abdomen area." Advised the pt that given she has a pretty significant hx with her diabetes and other no-cardiac related disease processes, she should contact her PCP Dr Loanne Drilling today, to see if he would be able to see the pt for abdominal issues.  Also advised the pt to run her abdominal issues by her GI MD on this Thursday 09/19/15 for her pre-workup for her upcoming colonoscopy.  Informed the pt that it doesn't seem as if her complaints are cardiac in nature, but if her PCP advises her to come back in to see Cardiology, we will be more than happy to arrange a follow-up appt.  Pt verbalized understanding and agrees with this plan.  Pt states she will call her PCP now to report her symptoms.  Will route this message to Dr Meda Coffee as an Juluis Rainier.

## 2015-09-16 NOTE — Telephone Encounter (Signed)
New message      Talk to the nurse about fluid in her body

## 2015-09-17 NOTE — Congregational Nurse Program (Signed)
Congregational Nurse Program Note  Date of Encounter: 09/15/2015  Past Medical History: Past Medical History  Diagnosis Date  . Hyperlipidemia   . DM2 (diabetes mellitus, type 2) ([place])   . Hypertension   . Allergic rhinitis   . GERD (gastroesophageal reflux disease)   . Osteoporosis   . Uterine fibroid   . Alkaline phosphatase deficiency     w/u Ne  . Dyslipidemia   . Thyroid nodule     small  . Gout   . Anxiety   . Hemorrhoids     Encounter Details:     CNP Questionnaire - 09/15/15 2137    Patient Demographics   Is this a new or existing patient? Existing   Patient is considered a/an Not Applicable   Race African-American/Black   Patient Assistance   Location of Patient Assistance [employer]   Patient's financial/insurance status Low Income;Medicare   Uninsured Patient No   Patient referred to apply for the following financial assistance Not Applicable   Food insecurities addressed Not Applicable   Transportation assistance No   Assistance securing medications No   Educational health offerings Diabetes;Health literacy;Nutrition;Chronic disease   Encounter Details   Primary purpose of visit Chronic Illness/Condition Visit;Education/Health Concerns;Spiritual Care/Support Visit   Was an Emergency Department visit averted? No   Does patient have a medical provider? Yes   Patient referred to Not Applicable   Was a mental health screening completed? (GAINS tool) No   Does patient have dental issues? No   Does patient have vision issues? No   Since previous encounter, have you referred patient for abnormal blood pressure that resulted in a new diagnosis or medication change? No   Since previous encounter, have you referred patient for abnormal blood glucose that resulted in a new diagnosis or medication change? No   For Abstraction Use Only   Does patient have insurance? Yes      Client reports she is doing well, getting injections in her knee and it is helping  a lot.  Also, reports she is in a special study from [facility]  Will follow as needed

## 2015-09-19 ENCOUNTER — Telehealth: Payer: Self-pay | Admitting: Endocrinology

## 2015-09-19 ENCOUNTER — Ambulatory Visit (AMBULATORY_SURGERY_CENTER): Payer: Self-pay

## 2015-09-19 VITALS — Ht 64.5 in | Wt 183.0 lb

## 2015-09-19 DIAGNOSIS — J309 Allergic rhinitis, unspecified: Secondary | ICD-10-CM

## 2015-09-19 DIAGNOSIS — Z1211 Encounter for screening for malignant neoplasm of colon: Secondary | ICD-10-CM

## 2015-09-19 MED ORDER — PROMETHAZINE-DM 6.25-15 MG/5ML PO SYRP: 2.5000 mL | ORAL_SOLUTION | Freq: Four times a day (QID) | ORAL | Status: DC | PRN

## 2015-09-19 MED ORDER — NA SULFATE-K SULFATE-MG SULF 17.5-3.13-1.6 GM/177ML PO SOLN: 1.0000 | Freq: Once | ORAL | Status: DC

## 2015-09-19 NOTE — Telephone Encounter (Signed)
PT requests call back from you

## 2015-09-19 NOTE — Telephone Encounter (Signed)
I contacted the pt. She stated due to all the drainage she is having she has been having severe nausea . Pt stated a few years back she was given zofran to help and wanted to know if we could rx this again for her.  Please advise, Thanks!

## 2015-09-19 NOTE — Telephone Encounter (Signed)
i have sent a prescription to your pharmacy, for a cough syrup that also helps the nausea Please see an allergy specialist.  you will receive a phone call, about a day and time for an appointment

## 2015-09-19 NOTE — Progress Notes (Signed)
No egg or soy allergies Not on home 02 No previous anesthesia complications No diet or weight loss meds 

## 2015-09-20 ENCOUNTER — Telehealth: Payer: Self-pay | Admitting: Endocrinology

## 2015-09-20 NOTE — Telephone Encounter (Signed)
please call patient: i got for for DM shoes: Sorry, you do not qualify

## 2015-09-20 NOTE — Telephone Encounter (Signed)
I contacted the pt and advised of note below. The pt voiced understanding.

## 2015-09-22 DIAGNOSIS — Z711 Person with feared health complaint in whom no diagnosis is made: Secondary | ICD-10-CM

## 2015-09-23 NOTE — Telephone Encounter (Signed)
Pt advised of note below 

## 2015-09-24 NOTE — Congregational Nurse Program (Signed)
Congregational Nurse Program Note  Date of Encounter: 09/22/2015  Past Medical History: Past Medical History  Diagnosis Date  . Hyperlipidemia   . DM2 (diabetes mellitus, type 2) ([place])   . Hypertension   . Allergic rhinitis   . GERD (gastroesophageal reflux disease)   . Osteoporosis   . Uterine fibroid   . Alkaline phosphatase deficiency     w/u Ne  . Dyslipidemia   . Thyroid nodule     small  . Gout   . Anxiety   . Hemorrhoids   . Arthritis     Encounter Details:     CNP Questionnaire - 09/22/15 2349    Patient Demographics   Is this a new or existing patient? Existing   Patient is considered a/an Not Applicable   Race African-American/Black   Patient Assistance   Location of Patient Assistance [employer]   Patient's financial/insurance status Low Income;Medicare   Uninsured Patient No   Patient referred to apply for the following financial assistance Not Applicable   Food insecurities addressed Not Applicable   Transportation assistance No   Assistance securing medications No   Educational health offerings Diabetes;Health literacy;Nutrition;Chronic disease   Encounter Details   Primary purpose of visit Chronic Illness/Condition Visit;Education/Health Concerns;Spiritual Care/Support Visit   Was an Emergency Department visit averted? No   Does patient have a medical provider? Yes   Patient referred to Not Applicable   Was a mental health screening completed? (GAINS tool) No   Does patient have dental issues? No   Does patient have vision issues? No   Since previous encounter, have you referred patient for abnormal blood pressure that resulted in a new diagnosis or medication change? No   Since previous encounter, have you referred patient for abnormal blood glucose that resulted in a new diagnosis or medication change? No   For Abstraction Use Only   Does patient have insurance? Yes     Client has been having pain in knees and been getting the injections  in the knee.  Has had some weight gain.  Encouraged to continue healthy eating, exercising as able.  She will address with dr at next visit.

## 2015-09-29 DIAGNOSIS — Z719 Counseling, unspecified: Secondary | ICD-10-CM

## 2015-10-03 ENCOUNTER — Encounter: Payer: Self-pay | Admitting: Gastroenterology

## 2015-10-03 ENCOUNTER — Ambulatory Visit (AMBULATORY_SURGERY_CENTER): Payer: Medicare Other | Admitting: Gastroenterology

## 2015-10-03 VITALS — BP 111/89 | HR 67 | Temp 97.7°F | Resp 21 | Ht 64.0 in | Wt 183.0 lb

## 2015-10-03 DIAGNOSIS — D125 Benign neoplasm of sigmoid colon: Secondary | ICD-10-CM | POA: Diagnosis not present

## 2015-10-03 DIAGNOSIS — D123 Benign neoplasm of transverse colon: Secondary | ICD-10-CM | POA: Diagnosis not present

## 2015-10-03 DIAGNOSIS — D124 Benign neoplasm of descending colon: Secondary | ICD-10-CM

## 2015-10-03 DIAGNOSIS — Z1211 Encounter for screening for malignant neoplasm of colon: Secondary | ICD-10-CM

## 2015-10-03 LAB — GLUCOSE, CAPILLARY
Glucose-Capillary: 137 mg/dL — ABNORMAL HIGH (ref 65–99)
Glucose-Capillary: 102 mg/dL — ABNORMAL HIGH (ref 65–99)

## 2015-10-03 MED ORDER — SODIUM CHLORIDE 0.9 % IV SOLN: 500.0000 mL | INTRAVENOUS | Status: DC

## 2015-10-03 NOTE — Op Note (Signed)
[facility] Patient Name: Amy Escobar Procedure Date: 10/03/2015 9:37 AM MRN: [id number] Endoscopist: Ladene Artist , MD Age: 70 Date of Birth: 1945-07-29 Gender: Female Procedure:                Colonoscopy Indications:              Screening for colorectal malignant neoplasm, Last                            colonoscopy 10 years ago Medicines:                Monitored Anesthesia Care Procedure:                Pre-Anesthesia Assessment:                           - Prior to the procedure, a History and Physical                            was performed, and patient medications and                            allergies were reviewed. The patient's tolerance of                            previous anesthesia was also reviewed. The risks                            and benefits of the procedure and the sedation                            options and risks were discussed with the patient.                            All questions were answered, and informed consent                            was obtained. Prior Anticoagulants: The patient has                            taken no previous anticoagulant or antiplatelet                            agents. ASA Grade Assessment: II - A patient with                            mild systemic disease. After reviewing the risks                            and benefits, the patient was deemed in                            satisfactory condition to undergo the procedure.  After obtaining informed consent, the colonoscope                            was passed under direct vision. Throughout the                            procedure, the patient's blood pressure, pulse, and                            oxygen saturations were monitored continuously. The                            Model PCF-H190L [id number]) scope was introduced                            through the anus and advanced to the the cecum,        identified by appendiceal orifice and ileocecal                            valve. The colonoscopy was performed without                            difficulty. The patient tolerated the procedure                            well. The quality of the bowel preparation was                            excellent. The ileocecal valve, appendiceal                            orifice, and rectum were photographed. Scope In: 9:47:33 AM Scope Out: 10:04:02 AM Scope Withdrawal Time: 0 hours 14 minutes 14 seconds  Total Procedure Duration: 0 hours 16 minutes 29 seconds  Findings:                 The digital rectal exam was normal.                           Three sessile polyps were found in the descending                            colon (2) and transverse colon (1). The polyps were                            6 to 7 mm in size. These polyps were removed with a                            cold snare. Resection and retrieval were complete.                           A 5 mm polyp was found in the sigmoid colon. The  polyp was sessile. The polyp was removed with a                            cold biopsy forceps. Resection and retrieval were                            complete.                           A few medium-mouthed diverticula were found in the                            sigmoid colon.                           The exam was otherwise without abnormality on                            direct and retroflexion views. Complications:            No immediate complications. Estimated Blood Loss:     Estimated blood loss was minimal. Impression:               - Three 6 to 7 mm polyps in the descending colon                            and in the transverse colon, removed with a cold                            snare. Resected and retrieved.                           - One 5 mm polyp in the sigmoid colon, removed with                            a cold biopsy forceps. Resected and  retrieved.                           - Diverticulosis in the sigmoid colon. Recommendation:           - Patient has a contact number available for                            emergencies. The signs and symptoms of potential                            delayed complications were discussed with the                            patient. Return to normal activities tomorrow.                            Written discharge instructions were provided to the  patient.                           - High fiber diet.                           - Continue present medications.                           - Await pathology results.                           - Repeat colonoscopy in 5 years for surveillance if                            polyp(s) precancerous otherwise no plans for future                            screening colonoscopies due to age. Ladene Artist, MD 10/03/2015 10:14:35 AM This report has been signed electronically.

## 2015-10-03 NOTE — Congregational Nurse Program (Signed)
Congregational Nurse Program Note  Date of Encounter: 09/29/2015  Past Medical History: Past Medical History  Diagnosis Date  . Hyperlipidemia   . DM2 (diabetes mellitus, type 2) ([place])   . Hypertension   . Allergic rhinitis   . GERD (gastroesophageal reflux disease)   . Osteoporosis   . Uterine fibroid   . Alkaline phosphatase deficiency     w/u Ne  . Dyslipidemia   . Thyroid nodule     small  . Gout   . Anxiety   . Hemorrhoids   . Arthritis     Encounter Details:     CNP Questionnaire - 09/29/15 1022    Patient Demographics   Is this a new or existing patient? Existing   Patient is considered a/an Not Applicable   Race African-American/Black   Patient Assistance   Location of Patient Assistance [facility] [employer]   Patient's financial/insurance status Low Income;Medicare   Uninsured Patient No   Patient referred to apply for the following financial assistance Not Applicable   Food insecurities addressed Not Applicable   Transportation assistance No   Assistance securing medications No   Educational health offerings Diabetes;Health literacy;Nutrition;Chronic disease   Encounter Details   Primary purpose of visit Chronic Illness/Condition Visit;Education/Health Concerns;Spiritual Care/Support Visit   Was an Emergency Department visit averted? No   Does patient have a medical provider? Yes   Patient referred to Not Applicable   Was a mental health screening completed? (GAINS tool) No   Does patient have dental issues? No   Does patient have vision issues? No   Since previous encounter, have you referred patient for abnormal blood pressure that resulted in a new diagnosis or medication change? No   Since previous encounter, have you referred patient for abnormal blood glucose that resulted in a new diagnosis or medication change? No   For Abstraction Use Only   Does patient have insurance? Yes     Client reports went to [facility] for study appt.  She will be in a study  about Vitamin D.  Will followup prn

## 2015-10-03 NOTE — Progress Notes (Signed)
Called to room to assist during endoscopic procedure.  Patient ID and intended procedure confirmed with present staff. Received instructions for my participation in the procedure from the performing physician.  

## 2015-10-03 NOTE — Patient Instructions (Signed)
YOU HAD AN ENDOSCOPIC PROCEDURE TODAY AT [facility]:   Refer to the procedure report that was given to you for any specific questions about what was found during the examination.  If the procedure report does not answer your questions, please call your gastroenterologist to clarify.  If you requested that your care partner not be given the details of your procedure findings, then the procedure report has been included in a sealed envelope for you to review at your convenience later.  YOU SHOULD EXPECT: Some feelings of bloating in the abdomen. Passage of more gas than usual.  Walking can help get rid of the air that was put into your GI tract during the procedure and reduce the bloating. If you had a lower endoscopy (such as a colonoscopy or flexible sigmoidoscopy) you may notice spotting of blood in your stool or on the toilet paper. If you underwent a bowel prep for your procedure, you may not have a normal bowel movement for a few days.  Please Note:  You might notice some irritation and congestion in your nose or some drainage.  This is from the oxygen used during your procedure.  There is no need for concern and it should clear up in a day or so.  SYMPTOMS TO REPORT IMMEDIATELY:   Following lower endoscopy (colonoscopy or flexible sigmoidoscopy):  Excessive amounts of blood in the stool  Significant tenderness or worsening of abdominal pains  Swelling of the abdomen that is new, acute  Fever of 100F or higher   For urgent or emergent issues, a gastroenterologist can be reached at any hour by calling [phone].   DIET: Your first meal following the procedure should be a small meal and then it is ok to progress to your normal diet. Heavy or fried foods are harder to digest and may make you feel nauseous or bloated.  Likewise, meals heavy in dairy and vegetables can increase bloating.  Drink plenty of fluids but you should avoid alcoholic beverages for 24  hours.  ACTIVITY:  You should plan to take it easy for the rest of today and you should NOT DRIVE or use heavy machinery until tomorrow (because of the sedation medicines used during the test).    FOLLOW UP: Our staff will call the number listed on your records the next business day following your procedure to check on you and address any questions or concerns that you may have regarding the information given to you following your procedure. If we do not reach you, we will leave a message.  However, if you are feeling well and you are not experiencing any problems, there is no need to return our call.  We will assume that you have returned to your regular daily activities without incident.  If any biopsies were taken you will be contacted by phone or by letter within the next 1-3 weeks.  Please call us at [phone] if you have not heard about the biopsies in 3 weeks.    SIGNATURES/CONFIDENTIALITY: You and/or your care partner have signed paperwork which will be entered into your electronic medical record.  These signatures attest to the fact that that the information above on your After Visit Summary has been reviewed and is understood.  Full responsibility of the confidentiality of this discharge information lies with you and/or your care-partner.  Polyps. Diverticulosis, and high fiber diet information given.

## 2015-10-04 NOTE — Telephone Encounter (Signed)
Unable to leave message no voicemail available. SM

## 2015-10-09 ENCOUNTER — Encounter: Payer: Self-pay | Admitting: Gastroenterology

## 2015-10-16 ENCOUNTER — Ambulatory Visit: Payer: Medicare Other | Admitting: Family Medicine

## 2015-10-30 ENCOUNTER — Other Ambulatory Visit: Payer: Self-pay

## 2015-10-30 MED ORDER — METFORMIN HCL ER 500 MG PO TB24: 1000.0000 mg | ORAL_TABLET | Freq: Two times a day (BID) | ORAL | Status: DC

## 2015-11-20 ENCOUNTER — Encounter: Payer: Self-pay | Admitting: Family Medicine

## 2015-11-20 ENCOUNTER — Ambulatory Visit (INDEPENDENT_AMBULATORY_CARE_PROVIDER_SITE_OTHER): Payer: Medicare Other | Admitting: Family Medicine

## 2015-11-20 VITALS — BP 122/78 | HR 72 | Wt 178.0 lb

## 2015-11-20 DIAGNOSIS — S83206D Unspecified tear of unspecified meniscus, current injury, right knee, subsequent encounter: Secondary | ICD-10-CM | POA: Diagnosis not present

## 2015-11-20 DIAGNOSIS — M170 Bilateral primary osteoarthritis of knee: Secondary | ICD-10-CM | POA: Diagnosis not present

## 2015-11-20 NOTE — Progress Notes (Signed)
[name] [address], [place] [address] Phone: [phone] Subjective:     CC: Bilateral knee pain follow-up  [id number] Amy Escobar is a 70 y.o. female coming in with complaint of bilateral knee pain. 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 Patient finished viscous supplementation 2 months ago. Has been doing very well but is starting to notice some mild discomfort. Starting to affect her walking long distances. Patient is going on a trip for the next 2 weeks and was to be able to angulate well. Feels and possible steroid injections could be beneficial. Not giving away on her but continued to give her a dull aching in 10 to time.  Past Medical History  Diagnosis Date  . Hyperlipidemia   . DM2 (diabetes mellitus, type 2) ([place])   . Hypertension   . Allergic rhinitis   . GERD (gastroesophageal reflux disease)   . Osteoporosis   . Uterine fibroid   . Alkaline phosphatase deficiency     w/u Ne  . Dyslipidemia   . Thyroid nodule     small  . Gout   . Anxiety   . Hemorrhoids   . Arthritis    Past Surgical History  Procedure Laterality Date  . 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 Stress cardiolite  02/12/2006  . Echocardiogram (other)  01/16/2002  . Tubal ligation    . Toenail excision    . Cataract extraction Bilateral 2016  . Colonoscopy  2007   Social History  Substance Use Topics  . Smoking status: Former Smoker -- 0.25 packs/day for 5 years    Types: Cigarettes    Quit date: 06/29/1968  . Smokeless tobacco: Never Used  . Alcohol Use: No   Allergies  Allergen Reactions  . Olmesartan Medoxomil     REACTION: headache   Family History  Problem Relation Age of Onset  . Heart disease Mother   . Heart disease Maternal Grandfather   . Rectal cancer Maternal Grandfather   . Stomach cancer Maternal Grandmother   . Heart disease Father   . Colon cancer Neg Hx       X-rays previously taken in 2015 showed mild to moderate osteophytic changes mostly of the medial compartment.  Past medical history, social, surgical and family history all reviewed in electronic medical record.   Review of Systems: No headache, visual changes, nausea, vomiting, diarrhea, constipation, dizziness, abdominal pain, skin rash, fevers, chills, night sweats, weight loss, swollen lymph nodes, body aches, joint swelling, muscle aches, chest pain, shortness of breath, mood changes.   Objective Blood pressure 122/78, pulse 72, weight 178 lb (80.74 kg).  General: No apparent distress alert and oriented x3 mood and affect normal, dressed appropriately.  HEENT: Pupils equal, extraocular movements intact  Respiratory: Patient's speak in full sentences and does not appear short of breath  Cardiovascular: No lower extremity edema, non tender, no erythema  Skin: Warm dry intact with no signs of infection or rash on extremities or on axial skeleton.  Abdomen: Soft nontender  Neuro: Cranial nerves II through XII are intact, neurovascularly intact in all extremities with 2+ DTRs and 2+ pulses.  Lymph: No lymphadenopathy of posterior or anterior cervical chain or axillae bilaterally.  Gait normal with good balance and coordination.  MSK:  Non tender with full range of motion and good stability and symmetric strength and tone of shoulders, elbows, wrist, hip, and ankles bilaterally.  Knee: Bilateral  Mild valgus deformity bilaterally Moderate to severely tender to palpation over the medial joint line ROM full in flexion and extension and lower leg rotation. Ligaments with solid consistent endpoints including ACL, PCL, LCL, LargeChips.pl with no pain Positive Mcmurray's, Apley's, and Thessalonian tests on right side but negative on left. Non painful patellar compression. Patellar glide with mild crepitus. Patellar and quadriceps tendons unremarkable. Hamstring and quadriceps strength is  normal.  Worsening from previous exam.   After informed written and verbal consent, patient was seated on exam table. Right knee was prepped with alcohol swab and utilizing anterolateral approach, patient's right knee space was injected with 4:1  marcaine 0.5%: Kenalog 40mg /dL. Patient tolerated the procedure well without immediate complications.   After informed written and verbal consent, patient was seated on exam table. Left knee was prepped with alcohol swab and utilizing anterolateral approach, patient's left knee space was injected with 4:1  marcaine 0.5%: Kenalog 40mg /dL. Patient tolerated the procedure well without immediate complications.    Impression and Recommendations:

## 2015-11-20 NOTE — Patient Instructions (Signed)
Good to see you  Have a great time in [place] doing exercises  Stay active and use ice when you need it.  Fax number [phone] See me when you need me

## 2015-11-20 NOTE — Assessment & Plan Note (Addendum)
Patient given injection today and tolerated the procedure well. We discussed icing regimen and home exercises. We discussed which activities to do in which ones to avoid. Patient will go on her trip and see me more on an as-needed basis.  Spent  25 minutes with patient face-to-face and had greater than 50% of counseling including as described above in assessment and plan.

## 2015-11-20 NOTE — Assessment & Plan Note (Signed)
Still signs and symptoms. Patient does not want have any surgical intervention.

## 2016-01-07 DIAGNOSIS — E1142 Type 2 diabetes mellitus with diabetic polyneuropathy: Secondary | ICD-10-CM | POA: Diagnosis not present

## 2016-01-07 DIAGNOSIS — E0842 Diabetes mellitus due to underlying condition with diabetic polyneuropathy: Secondary | ICD-10-CM | POA: Diagnosis not present

## 2016-01-07 DIAGNOSIS — I10 Essential (primary) hypertension: Secondary | ICD-10-CM | POA: Diagnosis not present

## 2016-01-07 DIAGNOSIS — M179 Osteoarthritis of knee, unspecified: Secondary | ICD-10-CM | POA: Diagnosis not present

## 2016-01-16 DIAGNOSIS — M179 Osteoarthritis of knee, unspecified: Secondary | ICD-10-CM | POA: Diagnosis not present

## 2016-01-16 DIAGNOSIS — E0842 Diabetes mellitus due to underlying condition with diabetic polyneuropathy: Secondary | ICD-10-CM | POA: Diagnosis not present

## 2016-01-16 DIAGNOSIS — M159 Polyosteoarthritis, unspecified: Secondary | ICD-10-CM | POA: Diagnosis not present

## 2016-01-16 DIAGNOSIS — E119 Type 2 diabetes mellitus without complications: Secondary | ICD-10-CM | POA: Diagnosis not present

## 2016-02-10 ENCOUNTER — Ambulatory Visit (INDEPENDENT_AMBULATORY_CARE_PROVIDER_SITE_OTHER): Payer: Medicare Other | Admitting: Endocrinology

## 2016-02-10 ENCOUNTER — Ambulatory Visit
Admission: RE | Admit: 2016-02-10 | Discharge: 2016-02-10 | Disposition: A | Discharge status: Home / Self Care | Payer: Medicare Other | Source: Ambulatory Visit | Attending: Endocrinology | Admitting: Endocrinology

## 2016-02-10 ENCOUNTER — Encounter: Payer: Self-pay | Admitting: Endocrinology

## 2016-02-10 VITALS — BP 118/73 | HR 76 | Temp 98.7°F | Ht 65.0 in | Wt 178.0 lb

## 2016-02-10 DIAGNOSIS — M542 Cervicalgia: Secondary | ICD-10-CM | POA: Diagnosis not present

## 2016-02-10 DIAGNOSIS — R32 Unspecified urinary incontinence: Secondary | ICD-10-CM | POA: Diagnosis not present

## 2016-02-10 DIAGNOSIS — R51 Headache: Secondary | ICD-10-CM

## 2016-02-10 DIAGNOSIS — E1310 Other specified diabetes mellitus with ketoacidosis without coma: Secondary | ICD-10-CM | POA: Diagnosis not present

## 2016-02-10 DIAGNOSIS — R519 Headache, unspecified: Secondary | ICD-10-CM

## 2016-02-10 LAB — POCT GLYCOSYLATED HEMOGLOBIN (HGB A1C): HEMOGLOBIN A1C: 6.1

## 2016-02-10 MED ORDER — BENZONATATE 100 MG PO CAPS: 100.0000 mg | ORAL_CAPSULE | Freq: Three times a day (TID) | ORAL | 5 refills | Status: DC | PRN

## 2016-02-10 NOTE — Progress Notes (Signed)
Pre visit review using our clinic tool,if applicable. No additional management support is needed unless otherwise documented below in the visit note.  

## 2016-02-10 NOTE — Progress Notes (Signed)
Subjective:    Patient ID: Amy Escobar, female    DOB: May 31, 1946, 70 y.o.   MRN: [id number]  HPI Pt returns for f/u of diabetes mellitus: DM type: 2 Dx'ed: 1438 Complications: none Therapy: metformin GDM: never.   DKA: never.   Severe hypoglycemia: never.   Pancreatitis: never.  Other: she has never been on insulin.   Interval history: pt states she feels well in general, except for weight gain.  She takes metformin as rx'ed.   Pt states 1 month of slight pain at the left posterior neck, but no assoc numbness.  This started after a fall.   Past Medical History:  Diagnosis Date  . Alkaline phosphatase deficiency    w/u Ne  . Allergic rhinitis   . Anxiety   . Arthritis   . DM2 (diabetes mellitus, type 2) ([place])   . Dyslipidemia   . GERD (gastroesophageal reflux disease)   . Gout   . Hemorrhoids   . Hyperlipidemia   . Hypertension   . Osteoporosis   . Thyroid nodule    small  . Uterine fibroid     Past Surgical History:  Procedure Laterality Date  . CATARACT EXTRACTION Bilateral 2016  . COLONOSCOPY  2007  . echocardiogram (other)  01/16/2002  . 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 stress cardiolite  02/12/2006  . TOENAIL EXCISION    . TUBAL LIGATION      Social History   Social History  . Marital status: Widowed    Spouse name: N/A  . Number of children: 2  . Years of education: N/A   Occupational History  . OTC CLERK Unemployed   Social History Main Topics  . Smoking status: Former Smoker    Packs/day: 0.25    Years: 5.00    Types: Cigarettes    Quit date: 06/29/1968  . Smokeless tobacco: Never Used  . Alcohol use No  . Drug use: No  . Sexual activity: Not on file   Other Topics Concern  . Not on file   Social History Narrative   Widowed 2010.     Current Outpatient Prescriptions on File Prior to Visit  Medication Sig Dispense Refill  . amLODipine (NORVASC) 2.5 MG tablet TAKE 1 TABLET BY MOUTH DAILY 90 tablet 2  . aspirin EC 81  MG tablet Take 81 mg by mouth daily.    . Blood Glucose Monitoring Suppl (ONE TOUCH ULTRA MINI) W/DEVICE KIT 1 Device by Does not apply route once. 1 each 0  . Cholecalciferol (VITAMIN D3) 2000 units TABS Take 2,000 Int'l Units by mouth. Study at [facility]    . gabapentin (NEURONTIN) 600 MG tablet TAKE 1 TABLET (600 MG TOTAL) BY MOUTH 3 (THREE) TIMES DAILY. 270 tablet 10  . glucose blood (ONE TOUCH ULTRA TEST) test strip TEST BLOOD SUGAR THREE TIMES A DAY 100 each 2  . metFORMIN (GLUCOPHAGE-XR) 500 MG 24 hr tablet Take 2 tablets (1,000 mg total) by mouth 2 (two) times daily. 4 tabs daily 360 tablet 1  . NEXIUM 40 MG capsule TAKE 1 CAPSULE (40 MG TOTAL) BY MOUTH DAILY BEFORE BREAKFAST. 90 capsule 8  . ONETOUCH DELICA LANCETS 88L MISC USE AS DIRECTED BY PRESCRIBER THREE TIMES A DAY 300 each 9  . simvastatin (ZOCOR) 40 MG tablet Take 1 tablet (40 mg total) by mouth at bedtime. 90 tablet 11  . UNKNOWN TO PATIENT Inject as directed every 6 (six) months. Injection for arthritis in right and  left knees    . valsartan-hydrochlorothiazide (DIOVAN-HCT) 160-12.5 MG tablet TAKE 1 TABLET BY MOUTH DAILY 30 tablet 0  . promethazine-dextromethorphan (PROMETHAZINE-DM) 6.25-15 MG/5ML syrup Take 2.5 mLs by mouth 4 (four) times daily as needed for cough. (Patient not taking: Reported on 10/03/2015) 118 mL 1   No current facility-administered medications on file prior to visit.     Allergies  Allergen Reactions  . Olmesartan Medoxomil     REACTION: headache    Family History  Problem Relation Age of Onset  . Heart disease Mother   . Heart disease Maternal Grandfather   . Rectal cancer Maternal Grandfather   . Stomach cancer Maternal Grandmother   . Heart disease Father   . Colon cancer Neg Hx     BP 118/73   Pulse 76   Temp 98.7 F (37.1 C) (Oral)   Ht '5\' 5"'  (1.651 m)   Wt 178 lb (80.7 kg)   SpO2 97%   BMI 29.62 kg/m    Review of Systems Cough persists.  No change in chronic headache or  chronic urinary incont.    Objective:   Physical Exam VITAL SIGNS:  See vs page GENERAL: no distress Pulses: dorsalis pedis intact bilat.   MSK: no deformity of the feet CV: no leg edema Skin:  no ulcer on the feet.  normal color and temp on the feet.  Neuro: sensation is intact to touch on the feet, but decreased from normal.   Ext: both great toenails are absent.     A1c=6.1%    Assessment & Plan:  Type 2 DM: well-controlled Neck pain, new Chronic headache. Chronic cough, uncertain etiology Incont, persistent.

## 2016-02-10 NOTE — Patient Instructions (Addendum)
I have sent a prescription to your pharmacy, to refill the benzonatate. Please see 2 specialists.  you will receive a phone call, about days and times for appointments. Please continue the same metformin.   Please come back for a follow-up appointment in 6 months

## 2016-02-15 DIAGNOSIS — Z719 Counseling, unspecified: Secondary | ICD-10-CM

## 2016-02-19 ENCOUNTER — Telehealth: Payer: Self-pay | Admitting: Endocrinology

## 2016-02-19 NOTE — Telephone Encounter (Signed)
Pt is requesting further explanation of the xray results

## 2016-02-20 NOTE — Progress Notes (Signed)
[name] [place] [place], [place] [address] Phone: [phone] Subjective:     CC: Bilateral knee pain follow-up  [id number]  Amy Escobar is a 70 y.o. female coming in with complaint of bilateral knee pain. 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 Patient finished viscous supplementation 5 months ago. He did steroid injections 3 months ago. Patient states that they seem to be doing relatively well and does not want any injections today. Still having some instability.  Patient is also complaining of back pain. Patient states that this is been low back. Seems to be affecting certain daily activities such as going up and downstairs. Denies any radiation down the leg but states that both of her hips seem to be sore on the outside. States that it seems to get worse with activity as well as sitting. Better with lying down. Does wake her up from time to time. States that she compensates for the back and then her knee started to hurt her somewhat.  Previous imaging shows the patient did have cervical spine x-rays taken on 02/10/2016 by primary care physician. These were independently visualized by me showing mild degenerative changes and mild facet disease. No lumbar x-rays.  Past Medical History:  Diagnosis Date  . Alkaline phosphatase deficiency    w/u Ne  . Allergic rhinitis   . Anxiety   . Arthritis   . DM2 (diabetes mellitus, type 2) ([place])   . Dyslipidemia   . GERD (gastroesophageal reflux disease)   . Gout   . Hemorrhoids   . Hyperlipidemia   . Hypertension   . Osteoporosis   . Thyroid nodule    small  . Uterine fibroid    Past Surgical History:  Procedure Laterality Date  . CATARACT EXTRACTION Bilateral 2016  . COLONOSCOPY  2007  . echocardiogram (other)  01/16/2002  . 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 stress cardiolite  02/12/2006  . TOENAIL EXCISION    . TUBAL LIGATION      Social History  Substance Use Topics  . Smoking status: Former Smoker    Packs/day: 0.25    Years: 5.00    Types: Cigarettes    Quit date: 06/29/1968  . Smokeless tobacco: Never Used  . Alcohol use No   Allergies  Allergen Reactions  . Olmesartan Medoxomil     REACTION: headache   Family History  Problem Relation Age of Onset  . Heart disease Mother   . Heart disease Maternal Grandfather   . Rectal cancer Maternal Grandfather   . Stomach cancer Maternal Grandmother   . Heart disease Father   . Colon cancer Neg Hx      X-rays previously taken in 2015 showed mild to moderate osteophytic changes mostly of the medial compartment.  Past medical history, social, surgical and family history all reviewed in electronic medical record.   Review of Systems: No headache, visual changes, nausea, vomiting, diarrhea, constipation, dizziness, abdominal pain, skin rash, fevers, chills, night sweats, weight loss, swollen lymph nodes, body aches, joint swelling, muscle aches, chest pain, shortness of breath, mood changes.   Objective  Blood pressure 98/64, pulse 86, weight 176 lb (79.8 kg), SpO2 98 %.  General: No apparent distress alert and oriented x3 mood and affect normal, dressed appropriately.  HEENT: Pupils equal, extraocular movements intact  Respiratory: Patient's speak in full sentences and does not appear short of breath  Cardiovascular: No lower extremity edema,  non tender, no erythema  Skin: Warm dry intact with no signs of infection or rash on extremities or on axial skeleton.  Abdomen: Soft nontender  Neuro: Cranial nerves II through XII are intact, neurovascularly intact in all extremities with 2+ DTRs and 2+ pulses.  Lymph: No lymphadenopathy of posterior or anterior cervical chain or axillae bilaterally.  Gait normal with good balance and coordination.  MSK:  Non tender with full range of motion and good stability and symmetric strength and tone of shoulders, elbows,  wrist, hip, and ankles bilaterally.  Knee: Bilateral Mild valgus deformity bilaterally Mild pain of the medial joint line bilaterally. Ligaments with solid consistent endpoints including ACL, PCL, LCL, LargeChips.pl with no pain Positive Mcmurray's, Apley's, and Thessalonian tests on right side but negative on left. Non painful patellar compression. Patellar glide with mild crepitus. Patellar and quadriceps tendons unremarkable. Hamstring and quadriceps strength is normal.     Back Exam:  Inspection: Unremarkable  Motion: Flexion 35 deg, Extension 15 deg, Side Bending to 25 deg bilaterally,  Rotation to 25 deg bilaterally  SLR laying: Negative  XSLR laying: Negative  Palpable tenderness: Severe tenderness of the paraspinal musculature of the lumbar spine bilaterally.Marland Kitchen FABER: Positive bilaterally. Sensory change: Gross sensation intact to all lumbar and sacral dermatomes.  Reflexes: 2+ at both patellar tendons, 2+ at achilles tendons, Babinski's downgoing.  Strength at foot  Plantar-flexion: 5/5 Dorsi-flexion: 5/5 Eversion: 5/5 Inversion: 5/5  Leg strength  4-5 strength but symmetric Gait unremarkable.       Impression and Recommendations:

## 2016-02-20 NOTE — Telephone Encounter (Signed)
I contacted the pt and advised of x-ray result from 02/10/2016. Patient advised per Dr. Rolan Bucco was normal except for arthritis being present. Pt voiced understanding and had no further questions at this time.

## 2016-02-21 ENCOUNTER — Ambulatory Visit (INDEPENDENT_AMBULATORY_CARE_PROVIDER_SITE_OTHER)
Admission: RE | Admit: 2016-02-21 | Discharge: 2016-02-21 | Disposition: A | Discharge status: Home / Self Care | Payer: Medicare Other | Source: Ambulatory Visit | Attending: Family Medicine | Admitting: Family Medicine

## 2016-02-21 ENCOUNTER — Encounter: Payer: Self-pay | Admitting: Family Medicine

## 2016-02-21 ENCOUNTER — Ambulatory Visit (INDEPENDENT_AMBULATORY_CARE_PROVIDER_SITE_OTHER): Payer: Medicare Other | Admitting: Family Medicine

## 2016-02-21 VITALS — BP 98/64 | HR 86 | Wt 176.0 lb

## 2016-02-21 DIAGNOSIS — M545 Low back pain, unspecified: Secondary | ICD-10-CM

## 2016-02-21 DIAGNOSIS — M170 Bilateral primary osteoarthritis of knee: Secondary | ICD-10-CM

## 2016-02-21 DIAGNOSIS — M419 Scoliosis, unspecified: Secondary | ICD-10-CM | POA: Diagnosis not present

## 2016-02-21 MED ORDER — DICLOFENAC SODIUM 1 % TD GEL: 2.0000 g | Freq: Two times a day (BID) | TRANSDERMAL | 11 refills | Status: DC

## 2016-02-21 MED ORDER — TIZANIDINE HCL 4 MG PO TABS: 4.0000 mg | ORAL_TABLET | Freq: Every evening | ORAL | 2 refills | Status: AC

## 2016-02-21 MED ORDER — KETOROLAC TROMETHAMINE 60 MG/2ML IM SOLN
60.0000 mg | Freq: Once | INTRAMUSCULAR | Status: AC
Start: 2016-02-21 — End: 2016-02-21
  Administered 2016-02-21: 60 mg via INTRAMUSCULAR

## 2016-02-21 NOTE — Patient Instructions (Addendum)
Good to see you  Xray downstairs.  Ice 20 minutes 2 times daily. Usually after activity and before bed. Exercises 3 times a week.  Voltaren gel 2 times daily to lower back  zanaflex at night for next 3 nights then as needed.  1 injection today.  See me again 2-3 weeks.  Marland Kitchen

## 2016-02-21 NOTE — Assessment & Plan Note (Signed)
Patient does have some signs and symptoms of low back pain that is consistent with arthritic changes. Patient is having some mild radicular symptoms today. Bilaterally. Patient is artery on gabapentin at this time. No weakness noted. Positive Corky Sox makes sacroiliac dysfunction within the differential. X-rays ordered today. Injection of Toradol. We'll avoid pain medications. We discussed icing regimen and topical anti-inflammatories. Encourage her to take vitamin D regularly. Patient declined formal physical therapy. Follow-up again in 4 weeks.

## 2016-02-21 NOTE — Assessment & Plan Note (Signed)
Stable at moment. If worsening pain can repeat steroid injections.

## 2016-03-04 DIAGNOSIS — M545 Low back pain: Secondary | ICD-10-CM | POA: Diagnosis not present

## 2016-03-12 NOTE — Progress Notes (Signed)
[facility] [place], [place] [address] Phone: [phone] Subjective:     CC: Low back pain follow-up  [id number]  Amy Escobar is a 70 y.o. female coming in with complaint of   Patient is also complaining of back pain. Was having very localized pain. Seems to have no significant radiation but more of a muscle spasm. Patient did have a positive Faber test and possible sacroiliac dysfunction. Patient was sent for x-rays.  X-rays were independently visualized by me and show mild degenerative changes of the lower lumbar spine in aortic atherosclerosis. Patient has declined ultrasound of the abdominal cavity.  Patient was also given a muscle relaxer as well as topical anti-inflammatories. Patient was to do ice as well as some home exercises. Patient states She got a back brace. Feels that that has been helpful. Denies any new symptoms such as radiation down the legs. States that it just constant soreness. Patient states the medicines seem to take the edge off. Not stopping her from activities but states that still very uncomfortable. States that falling asleep can also be very uncomfortable.  Past Medical History:  Diagnosis Date  . Alkaline phosphatase deficiency    w/u Ne  . Allergic rhinitis   . Anxiety   . Arthritis   . DM2 (diabetes mellitus, type 2) ([place])   . Dyslipidemia   . GERD (gastroesophageal reflux disease)   . Gout   . Hemorrhoids   . Hyperlipidemia   . Hypertension   . Osteoporosis   . Thyroid nodule    small  . Uterine fibroid    Past Surgical History:  Procedure Laterality Date  . CATARACT EXTRACTION Bilateral 2016  . COLONOSCOPY  2007  . echocardiogram (other)  01/16/2002  . 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 stress cardiolite  02/12/2006  . TOENAIL EXCISION    . TUBAL LIGATION     Social History  Substance Use Topics  . Smoking status: Former Smoker    Packs/day: 0.25    Years: 5.00   Types: Cigarettes    Quit date: 06/29/1968  . Smokeless tobacco: Never Used  . Alcohol use No   Allergies  Allergen Reactions  . Olmesartan Medoxomil     REACTION: headache   Family History  Problem Relation Age of Onset  . Heart disease Mother   . Heart disease Maternal Grandfather   . Rectal cancer Maternal Grandfather   . Stomach cancer Maternal Grandmother   . Heart disease Father   . Colon cancer Neg Hx      X-rays previously taken in 2015 showed mild to moderate osteophytic changes mostly of the medial compartment.  Past medical history, social, surgical and family history all reviewed in electronic medical record.   Review of Systems: No headache, visual changes, nausea, vomiting, diarrhea, constipation, dizziness, abdominal pain, skin rash, fevers, chills, night sweats, weight loss, swollen lymph nodes, body aches, joint swelling, muscle aches, chest pain, shortness of breath, mood changes.   Objective  There were no vitals taken for this visit.  General: No apparent distress alert and oriented x3 mood and affect normal, dressed appropriately.  HEENT: Pupils equal, extraocular movements intact  Respiratory: Patient's speak in full sentences and does not appear short of breath  Cardiovascular: No lower extremity edema, non tender, no erythema  Skin: Warm dry intact with no signs of infection or rash on extremities or on axial skeleton.  Abdomen: Soft nontender  Neuro: Cranial  nerves II through XII are intact, neurovascularly intact in all extremities with 2+ DTRs and 2+ pulses.  Lymph: No lymphadenopathy of posterior or anterior cervical chain or axillae bilaterally.  Gait normal with good balance and coordination.  MSK:  Non tender with full range of motion and good stability and symmetric strength and tone of shoulders, elbows, wrist, hip, and ankles bilaterally.  Knee: Bilateral Mild valgus deformity bilaterally Mild pain of the medial joint line  bilaterally. Ligaments with solid consistent endpoints including ACL, PCL, LCL, LargeChips.pl with no pain Positive Mcmurray's, Apley's, and Thessalonian tests on right side but negative on left. Non painful patellar compression. Patellar glide with mild crepitus. Patellar and quadriceps tendons unremarkable. Hamstring and quadriceps strength is normal.     Back Exam:  Inspection: Unremarkable  Motion: Flexion 35 deg, Extension 15 deg, Side Bending to 25 deg bilaterally,  Rotation to 25 deg bilaterally  SLR laying: Negative  XSLR laying: Negative  Palpable tenderness: Severe tenderness of the paraspinal musculature of the lumbar spine bilaterally.Marland Kitchen FABER: Positive bilaterally. Sensory change: Gross sensation intact to all lumbar and sacral dermatomes.  Reflexes: 2+ at both patellar tendons, 2+ at achilles tendons, Babinski's downgoing.  Strength at foot  Plantar-flexion: 5/5 Dorsi-flexion: 5/5 Eversion: 5/5 Inversion: 5/5  Leg strength  4-5 strength but symmetric Gait unremarkable.       Impression and Recommendations:

## 2016-03-13 ENCOUNTER — Ambulatory Visit (INDEPENDENT_AMBULATORY_CARE_PROVIDER_SITE_OTHER): Payer: Medicare Other | Admitting: Family Medicine

## 2016-03-13 ENCOUNTER — Encounter: Payer: Self-pay | Admitting: Family Medicine

## 2016-03-13 DIAGNOSIS — M545 Low back pain, unspecified: Secondary | ICD-10-CM

## 2016-03-13 LAB — GLUCOSE, POCT (MANUAL RESULT ENTRY): POC GLUCOSE: 109 mg/dL — AB (ref 70–99)

## 2016-03-13 NOTE — Assessment & Plan Note (Signed)
Patient's x-ray does show some mild arthritic changes mostly of the facet joints. Very mild overall. We discussed with patient at great length. Patient was to continue with conservative therapy. Patient is wondering if there is any other pain medications a could be beneficial and we discussed with her we do not want to do this on a regular basis. Patient is artery and a large dose gabapentin. Does have an topical anti-inflammatories that can be helpful. We discussed the possibility of formal physical therapy and patient declined this but will start working on a more regular basis. Patient will then come back and see me again in 6-8 weeks for further evaluation or call sooner if worsening symptoms such as radicular symptoms or any weakness.  Spent  25 minutes with patient face-to-face and had greater than 50% of counseling including as described above in assessment and plan.

## 2016-03-13 NOTE — Patient Instructions (Addendum)
God to see you  I am glad you are doing better The brace with a lot of activity  Get moving, the gym would be great  Continue all the medicines for now Lets see what nuerology says Call me if things change Have a great weekend.

## 2016-03-16 ENCOUNTER — Other Ambulatory Visit: Payer: Self-pay

## 2016-03-16 MED ORDER — VALSARTAN-HYDROCHLOROTHIAZIDE 160-12.5 MG PO TABS: 1.0000 | ORAL_TABLET | Freq: Every day | ORAL | 3 refills | Status: DC

## 2016-03-26 DIAGNOSIS — R350 Frequency of micturition: Secondary | ICD-10-CM | POA: Diagnosis not present

## 2016-03-26 DIAGNOSIS — R3914 Feeling of incomplete bladder emptying: Secondary | ICD-10-CM | POA: Diagnosis not present

## 2016-03-26 DIAGNOSIS — N390 Urinary tract infection, site not specified: Secondary | ICD-10-CM | POA: Diagnosis not present

## 2016-04-13 ENCOUNTER — Other Ambulatory Visit: Payer: Self-pay | Admitting: Endocrinology

## 2016-04-14 ENCOUNTER — Ambulatory Visit (INDEPENDENT_AMBULATORY_CARE_PROVIDER_SITE_OTHER): Payer: Medicare Other | Admitting: Neurology

## 2016-04-14 ENCOUNTER — Encounter: Payer: Self-pay | Admitting: Neurology

## 2016-04-14 VITALS — BP 122/70 | Ht 64.0 in | Wt 177.0 lb

## 2016-04-14 DIAGNOSIS — R51 Headache: Secondary | ICD-10-CM | POA: Diagnosis not present

## 2016-04-14 DIAGNOSIS — M47812 Spondylosis without myelopathy or radiculopathy, cervical region: Secondary | ICD-10-CM

## 2016-04-14 DIAGNOSIS — M4692 Unspecified inflammatory spondylopathy, cervical region: Secondary | ICD-10-CM | POA: Diagnosis not present

## 2016-04-14 DIAGNOSIS — G4486 Cervicogenic headache: Secondary | ICD-10-CM

## 2016-04-14 NOTE — Progress Notes (Signed)
NEUROLOGY CONSULTATION NOTE  JADALYNN BURR MRN: [id number] DOB: [birth year]  Referring provider: Dr. Loanne Drilling Primary care provider: Dr. Loanne Drilling  Reason for consult:  headache  HISTORY OF PRESENT ILLNESS: Amy Escobar is a 70 year old woman with type 2 diabetes, HTN, HLD, and back pain who presents for headache.  History obtained by patient and PCP note.  Onset:  3 to 4 months ago Location:  Back of head, top of head and both parietal regions Quality:  Soreness of the scalp.  No head pressure or pounding/throbbing Intensity:  5/10 Aura:  no Prodrome:  no Associated symptoms:  No nausea, photophobia, phonophobia, or visual disturbance. Duration:  2 to 3 days Frequency:  Twice a month Triggers/exacerbating factors:  no Relieving factors:  no Activity:  Able to perform activities.  She does report neck pain and mild degenerative changes were seen on cervical plain films from 02/10/16, which was personally reviewed.  She also reports congestion and sinus drainage.  Past NSAIDS:  no Past analgesics:  no Past abortive triptans:  no Past muscle relaxants:  no Past anti-emetic:  no Past antidepressant medications:  no Past anticonvulsant medications:  no Past vitamins/Herbal/Supplements:  no Past antihistamines/decongestants:  no Other past therapies:  no  Current NSAIDS:  no Current analgesics:  no Current triptans:  no Current anti-emetic:  no Current muscle relaxants:  Has prescription for tizanidine but hasn't used it. Current anti-anxiolytic:  no Current sleep aide:  no Current Antihypertensive medications:  Diovan-HCT, amlodipine Current Antidepressant medications:  no Current Anticonvulsant medications:  gabapentin 639m twice daily (for diabetic neuropathy) Current Vitamins/Herbal/Supplements:  no Current Antihistamines/Decongestants:  Zyrtec Other therapy:  no  Caffeine:  no Alcohol:  no Smoker:  no Diet:  Not hydrates Exercise:   no Depression/stress:  Her pastor has terminal cancer Sleep hygiene:  okay No personally history of headache.  She sees Dr. STamala Julianfor knee and back pain.  She also is concerned about her memory.  BMP from this past year showed BUN 19, Cr 0.89 and GFR 80.63.  PAST MEDICAL HISTORY: Past Medical History:  Diagnosis Date  . Alkaline phosphatase deficiency    w/u Ne  . Allergic rhinitis   . Anxiety   . Arthritis   . [place]   . Dyslipidemia   . GERD (gastroesophageal reflux disease)   . Gout   . Hemorrhoids   . Hyperlipidemia   . Hypertension   . Osteoporosis   . Thyroid nodule    small  . Uterine fibroid     PAST SURGICAL HISTORY: Past Surgical History:  Procedure Laterality Date  . CATARACT EXTRACTION Bilateral 2016  . COLONOSCOPY  2007  . echocardiogram (other)  01/16/2002  . 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 stress cardiolite  02/12/2006  . TOENAIL EXCISION    . TUBAL LIGATION      MEDICATIONS: Current Outpatient Prescriptions on File Prior to Visit  Medication Sig Dispense Refill  . amLODipine (NORVASC) 2.5 MG tablet TAKE 1 TABLET BY MOUTH DAILY 90 tablet 2  . aspirin EC 81 MG tablet Take 81 mg by mouth daily.    . benzonatate (TESSALON) 100 MG capsule Take 1 capsule (100 mg total) by mouth 3 (three) times daily as needed for cough. 30 capsule 5  . Blood Glucose Monitoring Suppl (ONE TOUCH ULTRA MINI) W/DEVICE KIT 1 Device by Does not apply route once. 1 each 0  . Cholecalciferol (VITAMIN D3) 2000 units TABS  Take 2,000 Int'l Units by mouth. Study at [facility]    . diclofenac sodium (VOLTAREN) 1 % GEL Apply 2 g topically 2 (two) times daily. To affected joint. 300 g 11  . gabapentin (NEURONTIN) 600 MG tablet TAKE 1 TABLET (600 MG TOTAL) BY MOUTH 3 (THREE) TIMES DAILY. 270 tablet 10  . glucose blood (ONE TOUCH ULTRA TEST) test strip 1 each by Other route daily. And lancets 1/day 100 each 3  . metFORMIN (GLUCOPHAGE-XR) 500 MG  24 hr tablet Take 2 tablets (1,000 mg total) by mouth 2 (two) times daily. 4 tabs daily 360 tablet 1  . NEXIUM 40 MG capsule TAKE 1 CAPSULE (40 MG TOTAL) BY MOUTH DAILY BEFORE BREAKFAST. 90 capsule 8  . simvastatin (ZOCOR) 40 MG tablet Take 1 tablet (40 mg total) by mouth at bedtime. 90 tablet 11  . UNKNOWN TO PATIENT Inject as directed every 6 (six) months. 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 valsartan-hydrochlorothiazide (DIOVAN-HCT) 160-12.5 MG tablet Take 1 tablet by mouth daily. 90 tablet 3   No current facility-administered medications on file prior to visit.     ALLERGIES: Allergies  Allergen Reactions  . Olmesartan Medoxomil     REACTION: headache    FAMILY HISTORY: Family History  Problem Relation Age of Onset  . Heart disease Mother   . Heart disease Maternal Grandfather   . Rectal cancer Maternal Grandfather   . Stomach cancer Maternal Grandmother   . Heart disease Father   . Colon cancer Neg Hx     SOCIAL HISTORY: Social History   Social History  . Marital status: Widowed    Spouse name: N/A  . Number of children: 2  . Years of education: N/A   Occupational History  . OTC CLERK Unemployed   Social History Main Topics  . Smoking status: Former Smoker    Packs/day: 0.25    Years: 5.00    Types: Cigarettes    Quit date: 06/29/1968  . Smokeless tobacco: Never Used  . Alcohol use No  . Drug use: No  . Sexual activity: Not on file   Other Topics Concern  . Not on file   Social History Narrative   Widowed 2010.     REVIEW OF SYSTEMS: Constitutional: No fevers, chills, or sweats, no generalized fatigue, change in appetite Eyes: No visual changes, double vision, eye pain Ear, nose and throat: No hearing loss, ear pain, nasal congestion, sore throat Cardiovascular: No chest pain, palpitations Respiratory:  No shortness of breath at rest or with exertion, wheezes GastrointestinaI: No nausea, vomiting, diarrhea, abdominal pain, fecal  incontinence Genitourinary:  No dysuria, urinary retention or frequency Musculoskeletal:  Neck pain, back pain Integumentary: No rash, pruritus, skin lesions Neurological: as above Psychiatric: No depression, insomnia, anxiety Endocrine: No palpitations, fatigue, diaphoresis, mood swings, change in appetite, change in weight, increased thirst Hematologic/Lymphatic:  No purpura, petechiae. Allergic/Immunologic: no itchy/runny eyes, nasal congestion, recent allergic reactions, rashes  PHYSICAL EXAM: Vitals:   04/14/16 0827  BP: 122/70   General: No acute distress.  Patient appears well-groomed.  Head:  Normocephalic/atraumatic Eyes:  fundi examined but not visualized Neck: supple, suboccipital and bilateral upper paraspinal tenderness, full range of motion Back: No paraspinal tenderness Heart: regular rate and rhythm Lungs: Clear to auscultation bilaterally. Vascular: No carotid bruits. Neurological Exam: Mental status: alert and oriented to person, place, and time, recent and remote memory intact, fund of knowledge intact, attention and concentration intact, speech fluent and  not dysarthric, language intact. Cranial nerves: CN I: not tested CN II: pupils equal, round and reactive to light, visual fields intact CN III, IV, VI:  full range of motion, no nystagmus, no ptosis CN V: facial sensation intact CN VII: upper and lower face symmetric CN VIII: hearing intact CN IX, X: gag intact, uvula midline CN XI: sternocleidomastoid and trapezius muscles intact CN XII: tongue midline Bulk & Tone: normal, no fasciculations. Motor:  5/5 throughout  Sensation: temperature and vibration sensation intact. Deep Tendon Reflexes:  2+ throughout, toes downgoing.  Finger to nose testing:  Without dysmetria.  Heel to shin:  Without dysmetria.  Gait:  Normal station and stride.  Able to turn and tandem walk. Romberg negative.  IMPRESSION: Tension-type/cervicogenic headache (more of a  soreness/tenderness of the scalp and back of head and neck) related to myofascial and arthritic neck pain.  She doesn't endorse symptoms or exhibit signs on exam to suggest a more serious condition.  PLAN: 1.  Recommend that she take tizanidine and NSAIDs for acute pain. 2.  Recommend that she see Dr. Tamala Julian (whom she sees for her knee and low back) for therapy on the neck.  Otherwise, I would recommend possibly nortriptyline or tizanidine three times daily around the clock. 3.  She will follow up to discuss her memory  Thank you for allowing me to take part in the care of this patient.  Metta Clines, DO  CC:  Renato Shin, MD

## 2016-04-14 NOTE — Patient Instructions (Signed)
I think the head pain is related to the neck.  Normally, I would treat with the muscle relaxer and anti-inflammatories (such as Advil or Aleve).  If needed, certain antidepressants are used to prevent headache.  However, I would recommend seeing Dr. Tamala Julian first for treatment on the neck.  Follow up for memory assessment.

## 2016-04-27 DIAGNOSIS — B957 Other staphylococcus as the cause of diseases classified elsewhere: Secondary | ICD-10-CM | POA: Diagnosis not present

## 2016-04-27 DIAGNOSIS — N3000 Acute cystitis without hematuria: Secondary | ICD-10-CM | POA: Diagnosis not present

## 2016-04-27 DIAGNOSIS — N390 Urinary tract infection, site not specified: Secondary | ICD-10-CM | POA: Diagnosis not present

## 2016-04-27 DIAGNOSIS — R350 Frequency of micturition: Secondary | ICD-10-CM | POA: Diagnosis not present

## 2016-05-06 ENCOUNTER — Other Ambulatory Visit: Payer: Self-pay | Admitting: Endocrinology

## 2016-05-13 ENCOUNTER — Other Ambulatory Visit: Payer: Self-pay | Admitting: Endocrinology

## 2016-05-18 ENCOUNTER — Telehealth: Payer: Self-pay | Admitting: Endocrinology

## 2016-05-18 NOTE — Telephone Encounter (Signed)
Pt called and said that she woke up with a knot on the back of her neck this morning she is very concerned and needed to know should she come in to see you or see Dr. Tomi Likens?  Please advise.

## 2016-05-18 NOTE — Telephone Encounter (Signed)
See message and please advise, Thanks!  

## 2016-05-18 NOTE — Telephone Encounter (Signed)
Tomorrow 1:45 PM

## 2016-05-19 ENCOUNTER — Encounter: Payer: Self-pay | Admitting: Endocrinology

## 2016-05-19 ENCOUNTER — Ambulatory Visit (INDEPENDENT_AMBULATORY_CARE_PROVIDER_SITE_OTHER): Payer: Medicare Other | Admitting: Endocrinology

## 2016-05-19 VITALS — BP 124/76 | HR 94 | Ht 64.5 in | Wt 172.8 lb

## 2016-05-19 DIAGNOSIS — E1165 Type 2 diabetes mellitus with hyperglycemia: Secondary | ICD-10-CM

## 2016-05-19 DIAGNOSIS — IMO0001 Reserved for inherently not codable concepts without codable children: Secondary | ICD-10-CM

## 2016-05-19 MED ORDER — ACYCLOVIR 200 MG PO CAPS: 800.0000 mg | ORAL_CAPSULE | Freq: Every day | ORAL | 0 refills | Status: DC

## 2016-05-19 MED ORDER — DOXYCYCLINE HYCLATE 100 MG PO TABS: 100.0000 mg | ORAL_TABLET | Freq: Two times a day (BID) | ORAL | 0 refills | Status: DC

## 2016-05-19 NOTE — Progress Notes (Signed)
Subjective:    Patient ID: Amy Escobar, female    DOB: Jan 04, 1946, 70 y.o.   MRN: [id number]  HPI Pt returns for f/u of diabetes mellitus: DM type: 2 Dx'ed: 4801 Complications: none Therapy: metformin GDM: never.   DKA: never.   Severe hypoglycemia: never.   Pancreatitis: never.  Other: she has never been on insulin.   Interval history: pt states she feels well in general, except for weight gain.  She takes metformin as rx'ed.  She has 1 day of swelling at the left post neck, and assoc pain Past Medical History:  Diagnosis Date  . Alkaline phosphatase deficiency    w/u Ne  . Allergic rhinitis   . Anxiety   . Arthritis   . DM2 (diabetes mellitus, type 2) ([place])   . Dyslipidemia   . GERD (gastroesophageal reflux disease)   . Gout   . Hemorrhoids   . Hyperlipidemia   . Hypertension   . Osteoporosis   . Thyroid nodule    small  . Uterine fibroid     Past Surgical History:  Procedure Laterality Date  . CATARACT EXTRACTION Bilateral 2016  . COLONOSCOPY  2007  . echocardiogram (other)  01/16/2002  . 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 stress cardiolite  02/12/2006  . TOENAIL EXCISION    . TUBAL LIGATION      Social History   Social History  . Marital status: Widowed    Spouse name: N/A  . Number of children: 2  . Years of education: N/A   Occupational History  . OTC CLERK Unemployed   Social History Main Topics  . Smoking status: Former Smoker    Packs/day: 0.25    Years: 5.00    Types: Cigarettes    Quit date: 06/29/1968  . Smokeless tobacco: Never Used  . Alcohol use No  . Drug use: No  . Sexual activity: Not on file   Other Topics Concern  . Not on file   Social History Narrative   Widowed 2010.     Current Outpatient Prescriptions on File Prior to Visit  Medication Sig Dispense Refill  . amLODipine (NORVASC) 2.5 MG tablet TAKE 1 TABLET BY MOUTH DAILY 90 tablet 2  . aspirin EC 81 MG tablet Take 81 mg by mouth daily.    .  benzonatate (TESSALON) 100 MG capsule Take 1 capsule (100 mg total) by mouth 3 (three) times daily as needed for cough. 30 capsule 5  . Blood Glucose Monitoring Suppl (ONE TOUCH ULTRA MINI) W/DEVICE KIT 1 Device by Does not apply route once. 1 each 0  . Cholecalciferol (VITAMIN D3) 2000 units TABS Take 2,000 Int'l Units by mouth. Study at [facility]    . diclofenac sodium (VOLTAREN) 1 % GEL Apply 2 g topically 2 (two) times daily. To affected joint. 300 g 11  . gabapentin (NEURONTIN) 600 MG tablet TAKE 1 TABLET (600 MG TOTAL) BY MOUTH 3 (THREE) TIMES DAILY. (Patient taking differently: Taking 684m QHS) 270 tablet 10  . metFORMIN (GLUCOPHAGE-XR) 500 MG 24 hr tablet TAKE TWO TABLETS BY MOUTH TWICE A DAY 360 tablet 0  . NEXIUM 40 MG capsule TAKE 1 CAPSULE (40 MG TOTAL) BY MOUTH DAILY BEFORE BREAKFAST. 90 capsule 8  . ONE TOUCH ULTRA TEST test strip USE TO TEST BLOOD SUGAR THREE TIMES A DAY 100 each 3  . simvastatin (ZOCOR) 40 MG tablet Take 1 tablet (40 mg total) by mouth at bedtime. 90 tablet 11  .  valsartan-hydrochlorothiazide (DIOVAN-HCT) 160-12.5 MG tablet Take 1 tablet by mouth daily. 90 tablet 3  . UNKNOWN TO PATIENT Inject as directed every 6 (six) months. Injection for arthritis in right and left knees     No current facility-administered medications on file prior to visit.     Allergies  Allergen Reactions  . Olmesartan Medoxomil     REACTION: headache    Family History  Problem Relation Age of Onset  . Heart disease Mother   . Heart disease Maternal Grandfather   . Rectal cancer Maternal Grandfather   . Stomach cancer Maternal Grandmother   . Heart disease Father   . Colon cancer Neg Hx     BP 124/76   Pulse 94   Ht 5' 4.5" (1.638 m)   Wt 172 lb 12.8 oz (78.4 kg)   SpO2 95%   BMI 29.20 kg/m    Review of Systems She denies hypoglycemia and fever    Objective:   Physical Exam VITAL SIGNS:  See vs page GENERAL: no distress Neck, left post aspect: 2 cm diam area of  erythema and ? of central vesicle.  No break in the skin.   Pulses: dorsalis pedis intact bilat.   MSK: no deformity of the feet CV: no leg edema. Skin:  no ulcer on the feet.  normal color and temp on the feet.  Neuro: sensation is intact to touch on the feet, but decreased from normal.   Ext: both great toenails are absent.     A1c=6.4%    Assessment & Plan:  Type 2 DM: well-controlled Neck swelling: early cellulitis vs early zoster.  Patient is advised the following: Patient Instructions  I have sent prescriptions to your pharmacy, for an antibiotic and pill against shingles. I hope you feel better soon. Please call if your symptoms get worse.  I'll see you next time.

## 2016-05-19 NOTE — Telephone Encounter (Signed)
Pateint added to the schedule and advised of appointment time.

## 2016-05-19 NOTE — Patient Instructions (Addendum)
I have sent prescriptions to your pharmacy, for an antibiotic and pill against shingles. I hope you feel better soon. Please call if your symptoms get worse.  I'll see you next time.

## 2016-05-26 ENCOUNTER — Telehealth: Payer: Self-pay | Admitting: Endocrinology

## 2016-05-26 DIAGNOSIS — G8929 Other chronic pain: Secondary | ICD-10-CM

## 2016-05-26 DIAGNOSIS — M545 Low back pain: Principal | ICD-10-CM

## 2016-05-26 NOTE — Telephone Encounter (Signed)
I contacted the patient and advised of message. She does not want the referral to Dr. Tamala Julian at this time. Pateint stated if the pain did not get better she would let us know and then we could place the referral.

## 2016-05-26 NOTE — Telephone Encounter (Signed)
Patient stated the pain was located at the back of her neck, but stated no rash or break in the skin is present.

## 2016-05-26 NOTE — Telephone Encounter (Signed)
Is this the pain at the back of your neck? Is there any rash or break in the skin?

## 2016-05-26 NOTE — Telephone Encounter (Signed)
Patient stated that the problem she was having, is coming back.  Please call

## 2016-05-26 NOTE — Telephone Encounter (Signed)
Ok, how about if I refer you to Dr Tamala Julian?  you will receive a phone call, about a day and time for an appointment

## 2016-05-26 NOTE — Telephone Encounter (Signed)
See message and please advise, Thanks!  

## 2016-06-08 ENCOUNTER — Other Ambulatory Visit: Payer: Medicare Other | Admitting: *Deleted

## 2016-06-08 DIAGNOSIS — I10 Essential (primary) hypertension: Secondary | ICD-10-CM

## 2016-06-08 DIAGNOSIS — E7800 Pure hypercholesterolemia, unspecified: Secondary | ICD-10-CM

## 2016-06-08 LAB — COMPREHENSIVE METABOLIC PANEL
ALT: 38 U/L — ABNORMAL HIGH (ref 6–29)
AST: 35 U/L (ref 10–35)
Albumin: 4.2 g/dL (ref 3.6–5.1)
Alkaline Phosphatase: 80 U/L (ref 33–130)
BUN: 11 mg/dL (ref 7–25)
CO2: 29 mmol/L (ref 20–31)
Calcium: 9.4 mg/dL (ref 8.6–10.4)
Chloride: 102 mmol/L (ref 98–110)
Creat: 0.73 mg/dL (ref 0.60–0.93)
Glucose, Bld: 122 mg/dL — ABNORMAL HIGH (ref 65–99)
Potassium: 3.9 mmol/L (ref 3.5–5.3)
Sodium: 140 mmol/L (ref 135–146)
Total Bilirubin: 0.5 mg/dL (ref 0.2–1.2)
Total Protein: 7.1 g/dL (ref 6.1–8.1)

## 2016-06-08 LAB — LIPID PANEL
Cholesterol: 123 mg/dL (ref <200)
HDL: 37 mg/dL — ABNORMAL LOW (ref >50)
LDL Cholesterol: 65 mg/dL (ref <100)
Total CHOL/HDL Ratio: 3.3 Ratio (ref <5.0)
Triglycerides: 104 mg/dL (ref <150)
VLDL: 21 mg/dL (ref <30)

## 2016-06-10 ENCOUNTER — Encounter: Payer: Self-pay | Admitting: Cardiology

## 2016-06-15 ENCOUNTER — Ambulatory Visit: Payer: Medicare Other | Admitting: Cardiology

## 2016-06-16 ENCOUNTER — Telehealth: Payer: Self-pay

## 2016-06-16 ENCOUNTER — Encounter: Payer: Self-pay | Admitting: Neurology

## 2016-06-16 ENCOUNTER — Ambulatory Visit (INDEPENDENT_AMBULATORY_CARE_PROVIDER_SITE_OTHER): Payer: Medicare Other | Admitting: Cardiology

## 2016-06-16 ENCOUNTER — Ambulatory Visit (INDEPENDENT_AMBULATORY_CARE_PROVIDER_SITE_OTHER): Payer: Medicare Other | Admitting: Neurology

## 2016-06-16 ENCOUNTER — Other Ambulatory Visit (INDEPENDENT_AMBULATORY_CARE_PROVIDER_SITE_OTHER): Payer: Medicare Other

## 2016-06-16 VITALS — BP 112/66 | HR 93 | Ht 64.0 in | Wt 172.0 lb

## 2016-06-16 VITALS — BP 122/74 | HR 68 | Ht 64.0 in | Wt 171.0 lb

## 2016-06-16 DIAGNOSIS — R51 Headache: Secondary | ICD-10-CM | POA: Diagnosis not present

## 2016-06-16 DIAGNOSIS — E118 Type 2 diabetes mellitus with unspecified complications: Secondary | ICD-10-CM

## 2016-06-16 DIAGNOSIS — I10 Essential (primary) hypertension: Secondary | ICD-10-CM

## 2016-06-16 DIAGNOSIS — R413 Other amnesia: Secondary | ICD-10-CM

## 2016-06-16 DIAGNOSIS — G4486 Cervicogenic headache: Secondary | ICD-10-CM

## 2016-06-16 LAB — VITAMIN B12: Vitamin B-12: 446 pg/mL (ref 211–911)

## 2016-06-16 NOTE — Progress Notes (Signed)
Patient ID: [name], female   DOB: September 27, 1945, 70 y.o.   MRN: [id number]     Patient Name: Amy Escobar Date of Encounter: 06/17/2016  Primary Care Provider:  Renato Shin, MD Primary Cardiologist:  Amy Escobar  Problem List   Past Medical History:  Diagnosis Date  . Alkaline phosphatase deficiency    w/u Ne  . Allergic rhinitis   . Anxiety   . Arthritis   . DM2 (diabetes mellitus, type 2) ([place])   . Dyslipidemia   . GERD (gastroesophageal reflux disease)   . Gout   . Hemorrhoids   . Hyperlipidemia   . Hypertension   . Osteoporosis   . Thyroid nodule    small  . Uterine fibroid    Past Surgical History:  Procedure Laterality Date  . CATARACT EXTRACTION Bilateral 2016  . COLONOSCOPY  2007  . echocardiogram (other)  01/16/2002  . 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 stress cardiolite  02/12/2006  . TOENAIL EXCISION    . TUBAL LIGATION     Allergies  Allergies  Allergen Reactions  . Olmesartan Medoxomil     REACTION: headache    HPI  70 year old with hypertension, hyperlipidemia, and known insulin-dependent diabetes mellitus with dyspnea on exertion for which she underwent following tests:   GXT:  Has had DOE - referred here for GXT and also referred to pulmonary. States she is doing better. Trying to lose weight. For PFTs later this month. Today the patient exercised on the standard Bruce protocol for a total of 2:41 minutes.  Poor exercise tolerance.  Adequate blood pressure response.  Clinically negative for chest pain. Test was stopped due to dyspnea; PVCs and over a minute run of bigeminy PVCs..  EKG negative for ischemia. No significant arrhythmia noted.   Lexiscan myoview - negative for scar or ischemia 24 Holter - episodes of nonsustained VT the longest lasting 5 beats for which she underwent Lexiscan Myoview.  Referred for a cardiology consult. The patient denies any palpitations, chest pain, lower extremity edema,  orthopnea, paroxysmal nocturnal dyspnea or prior syncope. She states that in the past she used to be quite active walking for about a mile every day but she stopped in November since then she felt quite short of breath.  She is coming after 2 months, normal stress test, echo showed mild MR, TR, RVSP 47 mmHg, she was started on 2.5 mg of amlodipine and feels significantly better. She exercises 5 x week and feels great. Mild dyspnea on moderate exertion.   06/16/16 - 1 year follow up, the patient feels great, she is very active and not limited in her activities of daily living. She denies CP< has stable DOE, no palpitations, dizziness or syncope.  She has been complaint with her meds and has no side effects. She continues to have knee pains. Muscle pain with simvastatin has resolved.  Home Medications  Prior to Admission medications   Medication Sig Start Date End Date Taking? Authorizing Provider  Blood Glucose Monitoring Suppl (ONE TOUCH ULTRA MINI) W/DEVICE KIT 1 Device by Does not apply route once. 11/25/12  Yes Amy Shin, MD  gabapentin (NEURONTIN) 300 MG capsule One three times a day 09/12/13  Yes Amy Rockers, MD  naproxen (NAPROSYN) 500 MG tablet Take 1 tablet (500 mg total) by mouth 2 (two) times daily with a meal. 08/10/12  Yes Amy Shin, MD  NEXIUM 40 MG capsule TAKE 1 CAPSULE (40 MG TOTAL) BY MOUTH DAILY  BEFORE BREAKFAST.   Yes Amy Shin, MD  ONE TOUCH ULTRA TEST test strip TEST BLOOD SUGAR THREE(3) TIMES DAILY 06/24/13  Yes Amy Shin, MD  [facility] DELICA LANCETS [address] USE AS DIRECTED BY PRESCRIBER THREE TIMES A DAY   Yes Amy Shin, MD  simvastatin (ZOCOR) 80 MG tablet TAKE 1 TABLET BY MOUTH EVERY NIGHT AT BEDTIME   Yes Amy Shin, MD  valsartan-hydrochlorothiazide (DIOVAN-HCT) 160-12.5 MG per tablet TAKE 1 TABLET BY MOUTH DAILY 07/29/13  Yes Amy Shin, MD    Family History  Family History  Problem Relation Age of Onset  . Heart disease Mother   . Heart  disease Father   . Heart disease Maternal Grandfather   . Rectal cancer Maternal Grandfather   . Stomach cancer Maternal Grandmother   . Colon cancer Neg Hx     Social History  Social History   Social History  . Marital status: Widowed    Spouse name: N/A  . Number of children: 2  . Years of education: N/A   Occupational History  . OTC CLERK Unemployed   Social History Main Topics  . Smoking status: Former Smoker    Packs/day: 0.25    Years: 5.00    Types: Cigarettes    Quit date: 06/29/1968  . Smokeless tobacco: Never Used  . Alcohol use No  . Drug use: No  . Sexual activity: Not on file   Other Topics Concern  . Not on file   Social History Narrative   Widowed 2010.     Review of Systems, as per HPI, otherwise negative General:  No chills, fever, night sweats or weight changes.  Cardiovascular:  No chest pain, dyspnea on exertion, edema, orthopnea, palpitations, paroxysmal nocturnal dyspnea. Dermatological: No rash, lesions/masses Respiratory: No cough, dyspnea Urologic: No hematuria, dysuria Abdominal:   No nausea, vomiting, diarrhea, bright red blood per rectum, melena, or hematemesis Neurologic:  No visual changes, wkns, changes in mental status. All other systems reviewed and are otherwise negative except as noted above.  Physical Exam  Blood pressure 112/66, pulse 93, height '5\' 4"'  (1.626 m), weight 172 lb (78 kg).  General: Pleasant, NAD Psych: Normal affect. Neuro: Alert and oriented X 3. Moves all extremities spontaneously. HEENT: Normal  Neck: Supple without bruits or JVD. Lungs:  Resp regular and unlabored, CTA. Heart: RRR no s3, s4, or murmurs. Abdomen: Soft, non-tender, non-distended, BS + x 4.  Extremities: No clubbing, cyanosis or edema. DP/PT/Radials 2+ and equal bilaterally.  Labs:  No results for input(s): CKTOTAL, CKMB, TROPONINI in the last 72 hours. Lab Results  Component Value Date   WBC 6.1 08/09/2015   HGB 12.3 08/09/2015   HCT  41.9 08/09/2015   MCV 93.9 08/09/2015   PLT 224.0 08/09/2015    Lab Results  Component Value Date   DDIMER <0.27 02/15/2015   Invalid input(s): POCBNP    Component Value Date/Time   NA 140 06/08/2016 0801   K 3.9 06/08/2016 0801   CL 102 06/08/2016 0801   CO2 29 06/08/2016 0801   GLUCOSE 122 (H) 06/08/2016 0801   BUN 11 06/08/2016 0801   CREATININE 0.73 06/08/2016 0801   CALCIUM 9.4 06/08/2016 0801   PROT 7.1 06/08/2016 0801   ALBUMIN 4.2 06/08/2016 0801   AST 35 06/08/2016 0801   ALT 38 (H) 06/08/2016 0801   ALKPHOS 80 06/08/2016 0801   BILITOT 0.5 06/08/2016 0801   GFRNONAA >60 02/16/2015 0644   GFRAA >60 02/16/2015 0644   Lab  Results  Component Value Date   CHOL 123 06/08/2016   HDL 37 (L) 06/08/2016   LDLCALC 65 06/08/2016   TRIG 104 06/08/2016   Accessory Clinical Findings  Echocardiogram - 09/20/2013 - Left ventricle: The cavity size was normal. Wall thickness was normal. Systolic function was low normal to mildly reduced. The estimated ejection fraction was in the range of 50% to 55%. Wall motion was normal; there were no regional wall motion abnormalities. Doppler parameters are consistent with abnormal left ventricular relaxation (grade 1 diastolic dysfunction). - Aortic valve: There was no stenosis. - Mitral valve: Mildly calcified annulus. Normal thickness leaflets . Mild regurgitation. - Left atrium: The atrium was mildly dilated. - Right ventricle: The cavity size was normal. Systolic function was normal. - Tricuspid valve: Peak RV-RA gradient: 47m Hg (S). - Pulmonary arteries: PA peak pressure: 42mHg (S). - Systemic veins: IVC not visualized. Impressions:  - Normal LV size with low normal to mildly reduced systolic function, EF 5044-97%Normal RV size and systolic function. Mild MR. Mild pulmonary hypertension.  04/10/2014 Left ventricle: The cavity size was normal. Wall thickness was increased in a pattern of mild LVH. Systolic function  was normal. The estimated ejection fraction was in the range of 55% to 60%. Wall motion was normal; there were no regional wall motion abnormalities. Doppler parameters are consistent with abnormal left ventricular relaxation (grade 1 diastolic dysfunction). - Pulmonary arteries: Systolic pressure was mildly increased. PA peak pressure: 36 mm Hg (S).  Impressions:  - Normal LV function; mild LVH; grade 1 diastolic dysfunction; mild TR; mildly elevated pulmonary pressure. Compared to 09/20/13, LV function remains normal.   Lexiscan Myoview: 09/21/2013 Impression  Exercise Capacity: Lexiscan with low level exercise.  BP Response: Normal blood pressure response.  Clinical Symptoms: No symptoms.  ECG Impression: There are scattered PVCs.  Comparison with Prior Nuclear Study: No previous nuclear study performed  Overall Impression: Low risk stress nuclear study with apical thinning defect and small area of inferolateral bowel artifact.  LV Ejection Fraction: 53%. LV Wall Motion: NL LV Function; NL Wall Motion  ECG - sinus rhythm, normal EKG.    Assessment & Plan  A pleasant 89ear old female who underwent extensive workup for evaluation of dyspnea on exertion. A nuclear stress test showed normal ejection fraction no prior infarct and no ischemia. Normal ECG.  1.DOE - Echocardiogram showed preserved LV ejection fraction, grade 1 diastolic dysfunction and mild pulmonary hypertension with PA pressure 47 mmHg - she was started on amlodipine with significant symptoms improvement and decrease of her RVSP to 36 mmHg. Dyspnea on exertion- due to pulmonary hypertension, normal stress test.   She continues to be very active and is asymptomatic.   2. Muscle pain with statins, borderline elevated LFTs, resolved after decreasing simvastatin to 40 mg po daily.  3. Hypertension - controlled.  4. Hyperlipidemia - lipids at goal on simvastatin to 40 mg po daily.  Follow up in 1  year with CMP and lipids prior to the visit.    KaEna DawleyMD, FAM[facility]2/20/2017, 12:16 AM

## 2016-06-16 NOTE — Patient Instructions (Signed)
Medication Instructions:   Your physician recommends that you continue on your current medications as directed. Please refer to the Current Medication list given to you today.   Labwork:  PRIOR TO YOUR ONE YEAR FOLLOW-UP APPOINTMENT WITH DR NELSON TO CHECK--CMET, HEMOGLOBIN A1C, CBC W DIFF, TSH, AND FASTING LIPIDS---PLEASE COME FASTING TO THIS LAB APPOINTMENT     Follow-Up:  Your physician wants you to follow-up in: [place] will receive a reminder letter in the mail two months in advance. If you don't receive a letter, please call our office to schedule the follow-up appointment.  PLEASE HAVE YOUR LABS DONE PRIOR TO THIS APPOINTMENT        If you need a refill on your cardiac medications before your next appointment, please call your pharmacy.

## 2016-06-16 NOTE — Telephone Encounter (Signed)
-----   Message from Pieter Partridge, DO sent at 06/16/2016 12:55 PM EST ----- b12 level is okay

## 2016-06-16 NOTE — Patient Instructions (Signed)
1.  We will check a vitamin B12 level as low B12 causes memory problems.  Otherwise, we can recheck memory in 9 months. 2.  Use tizanidine as needed for neck pain/headache.  Caution for drowsiness and do not drive if taken.

## 2016-06-16 NOTE — Progress Notes (Signed)
NEUROLOGY FOLLOW UP OFFICE NOTE  Amy Escobar [id number]  HISTORY OF PRESENT ILLNESS: Amy Escobar is a 70 year old woman with type 2 diabetes, HTN, HLD, and back pain whom I previously saw for cervicogenic headache presents today for evaluation of memory loss.  She just wants to have her memory checked.  This has been going on for a while.  Sometimes, she forgets why she walked into a room.  She needs to use a calendar to keep organized.  However, she does not get lost driving on familiar routes, she does not have trouble remembering close friends and family, she does not frequently have trouble remembering to take medication.  She denies family history of dementia.  TSH from 08/09/15 was 1.38.  Cervicogenic headaches are well-controlled.  She has not had one since last visit.  She has tizanidine as needed.    PAST MEDICAL HISTORY: Past Medical History:  Diagnosis Date  . Alkaline phosphatase deficiency    w/u Ne  . Allergic rhinitis   . Anxiety   . Arthritis   . DM2 (diabetes mellitus, type 2) ([place])   . Dyslipidemia   . GERD (gastroesophageal reflux disease)   . Gout   . Hemorrhoids   . Hyperlipidemia   . Hypertension   . Osteoporosis   . Thyroid nodule    small  . Uterine fibroid     MEDICATIONS: Current Outpatient Prescriptions on File Prior to Visit  Medication Sig Dispense Refill  . acyclovir (ZOVIRAX) 200 MG capsule Take 4 capsules (800 mg total) by mouth 5 (five) times daily. 100 capsule 0  . amLODipine (NORVASC) 2.5 MG tablet TAKE 1 TABLET BY MOUTH DAILY 90 tablet 2  . aspirin EC 81 MG tablet Take 81 mg by mouth daily.    . benzonatate (TESSALON) 100 MG capsule Take 1 capsule (100 mg total) by mouth 3 (three) times daily as needed for cough. 30 capsule 5  . Blood Glucose Monitoring Suppl (ONE TOUCH ULTRA MINI) W/DEVICE KIT 1 Device by Does not apply route once. 1 each 0  . Cholecalciferol (VITAMIN D3) 2000 units TABS Take 2,000 Int'l Units by mouth.  Study at [facility]    . diclofenac sodium (VOLTAREN) 1 % GEL Apply 2 g topically 2 (two) times daily. To affected joint. 300 g 11  . doxycycline (VIBRA-TABS) 100 MG tablet Take 1 tablet (100 mg total) by mouth 2 (two) times daily. 14 tablet 0  . gabapentin (NEURONTIN) 600 MG tablet TAKE 1 TABLET (600 MG TOTAL) BY MOUTH 3 (THREE) TIMES DAILY. (Patient taking differently: Taking 647m QHS) 270 tablet 10  . metFORMIN (GLUCOPHAGE-XR) 500 MG 24 hr tablet TAKE TWO TABLETS BY MOUTH TWICE A DAY 360 tablet 0  . NEXIUM 40 MG capsule TAKE 1 CAPSULE (40 MG TOTAL) BY MOUTH DAILY BEFORE BREAKFAST. 90 capsule 8  . ONE TOUCH ULTRA TEST test strip USE TO TEST BLOOD SUGAR THREE TIMES A DAY 100 each 3  . simvastatin (ZOCOR) 40 MG tablet Take 1 tablet (40 mg total) by mouth at bedtime. 90 tablet 11  . UNKNOWN TO PATIENT Inject as directed every 6 (six) months. 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 valsartan-hydrochlorothiazide (DIOVAN-HCT) 160-12.5 MG tablet Take 1 tablet by mouth daily. 90 tablet 3   No current facility-administered medications on file prior to visit.     ALLERGIES: Allergies  Allergen Reactions  . Olmesartan Medoxomil     REACTION: headache  FAMILY HISTORY: Family History  Problem Relation Age of Onset  . Heart disease Mother   . Heart disease Father   . Heart disease Maternal Grandfather   . Rectal cancer Maternal Grandfather   . Stomach cancer Maternal Grandmother   . Colon cancer Neg Hx     SOCIAL HISTORY: Social History   Social History  . Marital status: Widowed    Spouse name: N/A  . Number of children: 2  . Years of education: N/A   Occupational History  . OTC CLERK Unemployed   Social History Main Topics  . Smoking status: Former Smoker    Packs/day: 0.25    Years: 5.00    Types: Cigarettes    Quit date: 06/29/1968  . Smokeless tobacco: Never Used  . Alcohol use No  . Drug use: No  . Sexual activity: Not on file   Other Topics Concern  .  Not on file   Social History Narrative   Widowed 2010.     REVIEW OF SYSTEMS: Constitutional: No fevers, chills, or sweats, no generalized fatigue, change in appetite Eyes: No visual changes, double vision, eye pain Ear, nose and throat: No hearing loss, ear pain, nasal congestion, sore throat Cardiovascular: No chest pain, palpitations Respiratory:  No shortness of breath at rest or with exertion, wheezes GastrointestinaI: No nausea, vomiting, diarrhea, abdominal pain, fecal incontinence Genitourinary:  No dysuria, urinary retention or frequency Musculoskeletal:  No neck pain, back pain Integumentary: No rash, pruritus, skin lesions Neurological: as above Psychiatric: No depression, insomnia, anxiety Endocrine: No palpitations, fatigue, diaphoresis, mood swings, change in appetite, change in weight, increased thirst Hematologic/Lymphatic:  No purpura, petechiae. Allergic/Immunologic: no itchy/runny eyes, nasal congestion, recent allergic reactions, rashes  PHYSICAL EXAM: Vitals:   06/16/16 0914  BP: 122/74  Pulse: 68   General: No acute distress.  Patient appears well-groomed.  normal body habitus. Head:  Normocephalic/atraumatic Eyes:  Fundi examined but not visualized Neck: supple, no paraspinal tenderness, full range of motion Heart:  Regular rate and rhythm Lungs:  Clear to auscultation bilaterally Back: No paraspinal tenderness Neurological Exam: alert and oriented to person, place, and time. Attention span and concentration intact, recent and remote memory intact, fund of knowledge intact.  Speech fluent and not dysarthric, language intact.   MMSE - Mini Mental State Exam 06/16/2016  Orientation to time 5  Orientation to Place 5  Registration 3  Attention/ Calculation 5  Recall 2  Language- name 2 objects 2  Language- repeat 1  Language- follow 3 step command 3  Language- read & follow direction 1  Write a sentence 1  Copy design 1  Total score 29    IMPRESSION: Memory problems.  No cognitive impairment appreciated.  May be normal age-related memory loss. Cervicogenic headaches, stable  PLAN: 1.  Will check B12 level as low B12 may cause memory problems.  Otherwise, we can re-evaluate memory in 9 months. 2.  As for headaches and neck pain, tizanidine as needed (caution for drowsiness and should not drive if taken)  20 minutes spent face to face with patient, 100% spent discussing headache status, memory problems, causes of memory problems and plan.Metta Clines, DO  CC:  Renato Shin, MD

## 2016-06-24 ENCOUNTER — Telehealth: Payer: Self-pay | Admitting: Endocrinology

## 2016-06-24 NOTE — Telephone Encounter (Signed)
I contacted the patient and she stated she believes she has shingles and wanted to know if we would send her some medication in to help with it. Patient was advised Dr. Loanne Drilling was not in town and we could not send anything in without seeing her and the covering MD we had could not evaluate her for the shingles. Patient was advised during Dr. Cordelia Pen absence to follow up with urgent care or one of the [facility] offices and ask to see one of the NP's or PA's. Patient voiced understanding and had no further questions at this time.

## 2016-06-24 NOTE — Telephone Encounter (Signed)
Pt called in and requests that Amy Escobar give her a call back so she can discuss what is going on with her.

## 2016-07-02 NOTE — Congregational Nurse Program (Signed)
Congregational Nurse Program Note  Date of Encounter: 06/14/2016  Past Medical History: Past Medical History:  Diagnosis Date  . Alkaline phosphatase deficiency    w/u Ne  . Allergic rhinitis   . Anxiety   . Arthritis   . DM2 (diabetes mellitus, type 2) ([place])   . Dyslipidemia   . GERD (gastroesophageal reflux disease)   . Gout   . Hemorrhoids   . Hyperlipidemia   . Hypertension   . Osteoporosis   . Thyroid nodule    small  . Uterine fibroid     Encounter Details:     CNP Questionnaire - 06/14/16 2203      Patient Demographics   Is this a new or existing patient? Existing   Patient is considered a/an Not Applicable   Race African-American/Black     Patient Assistance   Location of Patient Assistance [facility]   Patient's financial/insurance status Low Income;Medicare   Uninsured Patient ([place] [facility]/[employer]) No   Patient referred to apply for the following financial assistance Not Applicable   Food insecurities addressed Not Applicable;Provided food gift voucher   Transportation assistance No   Assistance securing medications No   Educational health offerings Diabetes;Health literacy;Nutrition;Chronic disease     Encounter Details   Primary purpose of visit Chronic Illness/Condition Visit;Education/Health Concerns;Spiritual Care/Support Visit   Was an Emergency Department visit averted? No   Does patient have a medical provider? Yes   Patient referred to Not Applicable   Was a mental health screening completed? (GAINS tool) No   Does patient have dental issues? No   Does patient have vision issues? No   Does your patient have an abnormal blood pressure today? No   Since previous encounter, have you referred patient for abnormal blood pressure that resulted in a new diagnosis or medication change? No   Does your patient have an abnormal blood glucose today? No   Since previous encounter, have you referred patient for abnormal blood glucose that  resulted in a new diagnosis or medication change? No   Was there a life-saving intervention made? No    Client has been out of church x 2 weeks.  She had shingles and was on steroids and antibiotics.  She also had knee pain and treatnent for that.  She is better now and feeling ok.  Has gained weight with steroids.

## 2016-07-20 ENCOUNTER — Telehealth: Payer: Self-pay | Admitting: Endocrinology

## 2016-07-20 DIAGNOSIS — N390 Urinary tract infection, site not specified: Secondary | ICD-10-CM | POA: Diagnosis not present

## 2016-07-20 DIAGNOSIS — R350 Frequency of micturition: Secondary | ICD-10-CM | POA: Diagnosis not present

## 2016-07-20 DIAGNOSIS — R3914 Feeling of incomplete bladder emptying: Secondary | ICD-10-CM | POA: Diagnosis not present

## 2016-07-20 NOTE — Telephone Encounter (Signed)
I contacted the patient and left a voicemail she could come by and pick up a meter. Meter placed upfront.

## 2016-07-20 NOTE — Telephone Encounter (Signed)
Pt called and wants to know if she can come by and pick up a new One Touch Meter.

## 2016-07-21 ENCOUNTER — Telehealth: Payer: Self-pay | Admitting: Endocrinology

## 2016-07-21 MED ORDER — ONETOUCH ULTRA MINI W/DEVICE KIT: 1.0000 | PACK | Freq: Once | 0 refills | Status: AC

## 2016-07-21 NOTE — Telephone Encounter (Signed)
Refill submitted. 

## 2016-07-21 NOTE — Telephone Encounter (Signed)
Please call in asap the one touch ultra to [facility] she is heading that way now.

## 2016-07-21 NOTE — Telephone Encounter (Signed)
One touch mini that's what patient wants  Amy Escobar [facility] [address] [place] [phone] (Phone) [phone] (Fax)

## 2016-08-06 ENCOUNTER — Other Ambulatory Visit: Payer: Self-pay | Admitting: Cardiology

## 2016-08-07 ENCOUNTER — Telehealth: Payer: Self-pay | Admitting: Endocrinology

## 2016-08-07 ENCOUNTER — Ambulatory Visit (INDEPENDENT_AMBULATORY_CARE_PROVIDER_SITE_OTHER): Payer: Medicare Other | Admitting: Endocrinology

## 2016-08-07 ENCOUNTER — Encounter: Payer: Self-pay | Admitting: Endocrinology

## 2016-08-07 DIAGNOSIS — J042 Acute laryngotracheitis: Secondary | ICD-10-CM | POA: Diagnosis not present

## 2016-08-07 MED ORDER — PROMETHAZINE-CODEINE 6.25-10 MG/5ML PO SYRP: 5.0000 mL | ORAL_SOLUTION | ORAL | 1 refills | Status: DC | PRN

## 2016-08-07 MED ORDER — AZITHROMYCIN 500 MG PO TABS: 500.0000 mg | ORAL_TABLET | Freq: Every day | ORAL | 0 refills | Status: DC

## 2016-08-07 NOTE — Patient Instructions (Addendum)
I have sent a prescription to your pharmacy, for an antibiotic pill.  Loratadine-d (non-prescription) will help your congestion.  Here is a prescription, for cough syrup.  I hope you feel better soon.  I'll see you next week.  Please call sooner if you get worse.

## 2016-08-07 NOTE — Telephone Encounter (Signed)
Pt thought the pharmacy had not received the syrup rx but she had it in her hand

## 2016-08-07 NOTE — Progress Notes (Signed)
Subjective:    Patient ID: Amy Escobar, female    DOB: 1946-06-04, 71 y.o.   MRN: [id number]  HPI Pt states few days of moderate cough in the chest, and assoc nasal congestion.   Past Medical History:  Diagnosis Date  . Alkaline phosphatase deficiency    w/u Ne  . Allergic rhinitis   . Anxiety   . Arthritis   . DM2 (diabetes mellitus, type 2) ([place])   . Dyslipidemia   . GERD (gastroesophageal reflux disease)   . Gout   . Hemorrhoids   . Hyperlipidemia   . Hypertension   . Osteoporosis   . Thyroid nodule    small  . Uterine fibroid     Past Surgical History:  Procedure Laterality Date  . CATARACT EXTRACTION Bilateral 2016  . COLONOSCOPY  2007  . echocardiogram (other)  01/16/2002  . 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 stress cardiolite  02/12/2006  . TOENAIL EXCISION    . TUBAL LIGATION      Social History   Social History  . Marital status: Widowed    Spouse name: N/A  . Number of children: 2  . Years of education: N/A   Occupational History  . OTC CLERK Unemployed   Social History Main Topics  . Smoking status: Former Smoker    Packs/day: 0.25    Years: 5.00    Types: Cigarettes    Quit date: 06/29/1968  . Smokeless tobacco: Never Used  . Alcohol use No  . Drug use: No  . Sexual activity: Not on file   Other Topics Concern  . Not on file   Social History Narrative   Widowed 2010.     Current Outpatient Prescriptions on File Prior to Visit  Medication Sig Dispense Refill  . amLODipine (NORVASC) 2.5 MG tablet TAKE 1 TABLET BY MOUTH DAILY 90 tablet 2  . aspirin EC 81 MG tablet Take 81 mg by mouth daily.    . Cholecalciferol (VITAMIN D3) 2000 units TABS Take 2,000 Int'l Units by mouth. Study at [facility]    . gabapentin (NEURONTIN) 600 MG tablet TAKE 1 TABLET (600 MG TOTAL) BY MOUTH 3 (THREE) TIMES DAILY. (Patient taking differently: Taking 600mg  QHS) 270 tablet 10  . NEXIUM 40 MG capsule TAKE 1 CAPSULE (40 MG TOTAL) BY MOUTH DAILY  BEFORE BREAKFAST. 90 capsule 8  . ONE TOUCH ULTRA TEST test strip USE TO TEST BLOOD SUGAR THREE TIMES A DAY 100 each 3  . simvastatin (ZOCOR) 40 MG tablet Take 1 tablet (40 mg total) by mouth at bedtime. 90 tablet 11  . UNKNOWN TO PATIENT Inject as directed every 6 (six) months. 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 valsartan-hydrochlorothiazide (DIOVAN-HCT) 160-12.5 MG tablet Take 1 tablet by mouth daily. 90 tablet 3   No current facility-administered medications on file prior to visit.     Allergies  Allergen Reactions  . Olmesartan Medoxomil     REACTION: headache    Family History  Problem Relation Age of Onset  . Heart disease Mother   . Heart disease Father   . Heart disease Maternal Grandfather   . Rectal cancer Maternal Grandfather   . Stomach cancer Maternal Grandmother   . Colon cancer Neg Hx     BP 132/70   Pulse 98   Temp 99.2 F (37.3 C) (Oral)   Ht 5\' 4"  (1.626 m)   Wt 175 lb (79.4 kg)  SpO2 96%   BMI 30.04 kg/m    Review of Systems Denies fever and sob.      Objective:   Physical Exam VITAL SIGNS:  See vs page GENERAL: no distress head: no deformity  eyes: no periorbital swelling, no proptosis  external nose and ears are normal  mouth: no lesion seen erectile dysfunction LUNGS: Clear to auscultation.       Assessment & Plan:  URI: new Patient is advised the following: Patient Instructions  I have sent a prescription to your pharmacy, for an antibiotic pill.  Loratadine-d (non-prescription) will help your congestion.  Here is a prescription, for cough syrup.  I hope you feel better soon.  I'll see you next week.  Please call sooner if you get worse.

## 2016-08-08 ENCOUNTER — Other Ambulatory Visit: Payer: Self-pay | Admitting: Endocrinology

## 2016-08-08 ENCOUNTER — Other Ambulatory Visit: Payer: Self-pay | Admitting: Cardiology

## 2016-08-08 DIAGNOSIS — E7800 Pure hypercholesterolemia, unspecified: Secondary | ICD-10-CM

## 2016-08-08 DIAGNOSIS — I10 Essential (primary) hypertension: Secondary | ICD-10-CM

## 2016-08-09 DIAGNOSIS — J069 Acute upper respiratory infection, unspecified: Secondary | ICD-10-CM | POA: Insufficient documentation

## 2016-08-11 ENCOUNTER — Other Ambulatory Visit: Payer: Self-pay | Admitting: Endocrinology

## 2016-08-11 DIAGNOSIS — Z1231 Encounter for screening mammogram for malignant neoplasm of breast: Secondary | ICD-10-CM

## 2016-08-12 ENCOUNTER — Ambulatory Visit (INDEPENDENT_AMBULATORY_CARE_PROVIDER_SITE_OTHER): Payer: Medicare Other | Admitting: Endocrinology

## 2016-08-12 VITALS — BP 122/80 | HR 101 | Ht 64.0 in | Wt 172.0 lb

## 2016-08-12 DIAGNOSIS — E1165 Type 2 diabetes mellitus with hyperglycemia: Secondary | ICD-10-CM

## 2016-08-12 DIAGNOSIS — M810 Age-related osteoporosis without current pathological fracture: Secondary | ICD-10-CM | POA: Diagnosis not present

## 2016-08-12 DIAGNOSIS — IMO0001 Reserved for inherently not codable concepts without codable children: Secondary | ICD-10-CM

## 2016-08-12 DIAGNOSIS — M1A9XX0 Chronic gout, unspecified, without tophus (tophi): Secondary | ICD-10-CM | POA: Diagnosis not present

## 2016-08-12 LAB — URINALYSIS, ROUTINE W REFLEX MICROSCOPIC
Bilirubin Urine: NEGATIVE
Hgb urine dipstick: NEGATIVE
Ketones, ur: NEGATIVE
Leukocytes, UA: NEGATIVE
Nitrite: NEGATIVE
RBC / HPF: NONE SEEN (ref 0–?)
SPECIFIC GRAVITY, URINE: 1.01 (ref 1.000–1.030)
TOTAL PROTEIN, URINE-UPE24: NEGATIVE
URINE GLUCOSE: NEGATIVE
Urobilinogen, UA: 0.2 (ref 0.0–1.0)
WBC, UA: NONE SEEN (ref 0–?)
pH: 7.5 (ref 5.0–8.0)

## 2016-08-12 LAB — LIPID PANEL
CHOLESTEROL: 125 mg/dL (ref 0–200)
HDL: 34.9 mg/dL — ABNORMAL LOW (ref >39.00)
LDL Cholesterol: 68 mg/dL (ref 0–99)
NonHDL: 90.39
TRIGLYCERIDES: 111 mg/dL (ref 0.0–149.0)
Total CHOL/HDL Ratio: 4
VLDL: 22.2 mg/dL (ref 0.0–40.0)

## 2016-08-12 LAB — CBC WITH DIFFERENTIAL/PLATELET
BASOS PCT: 0.5 % (ref 0.0–3.0)
Basophils Absolute: 0 10*3/uL (ref 0.0–0.1)
EOS PCT: 5.4 % — AB (ref 0.0–5.0)
Eosinophils Absolute: 0.3 10*3/uL (ref 0.0–0.7)
HCT: 40.4 % (ref 36.0–46.0)
Hemoglobin: 13.2 g/dL (ref 12.0–15.0)
LYMPHS ABS: 2.7 10*3/uL (ref 0.7–4.0)
Lymphocytes Relative: 48.3 % — ABNORMAL HIGH (ref 12.0–46.0)
MCHC: 32.5 g/dL (ref 30.0–36.0)
MCV: 85 fl (ref 78.0–100.0)
MONO ABS: 0.6 10*3/uL (ref 0.1–1.0)
Monocytes Relative: 10.1 % (ref 3.0–12.0)
NEUTROS PCT: 35.7 % — AB (ref 43.0–77.0)
Neutro Abs: 2 10*3/uL (ref 1.4–7.7)
Platelets: 197 10*3/uL (ref 150.0–400.0)
RBC: 4.76 Mil/uL (ref 3.87–5.11)
RDW: 13.8 % (ref 11.5–15.5)
WBC: 5.7 10*3/uL (ref 4.0–10.5)

## 2016-08-12 LAB — HEPATIC FUNCTION PANEL
ALBUMIN: 4.3 g/dL (ref 3.5–5.2)
ALK PHOS: 91 U/L (ref 39–117)
ALT: 38 U/L — ABNORMAL HIGH (ref 0–35)
AST: 33 U/L (ref 0–37)
BILIRUBIN TOTAL: 0.4 mg/dL (ref 0.2–1.2)
Bilirubin, Direct: 0.1 mg/dL (ref 0.0–0.3)
Total Protein: 7.9 g/dL (ref 6.0–8.3)

## 2016-08-12 LAB — BASIC METABOLIC PANEL
BUN: 10 mg/dL (ref 6–23)
CHLORIDE: 100 meq/L (ref 96–112)
CO2: 30 mEq/L (ref 19–32)
Calcium: 9.6 mg/dL (ref 8.4–10.5)
Creatinine, Ser: 0.87 mg/dL (ref 0.40–1.20)
GFR: 82.53 mL/min (ref >60.00)
Glucose, Bld: 110 mg/dL — ABNORMAL HIGH (ref 70–99)
POTASSIUM: 4.1 meq/L (ref 3.5–5.1)
SODIUM: 137 meq/L (ref 135–145)

## 2016-08-12 LAB — TSH: TSH: 2.27 u[IU]/mL (ref 0.35–4.50)

## 2016-08-12 LAB — MICROALBUMIN / CREATININE URINE RATIO
CREATININE, U: 51.4 mg/dL
MICROALB UR: 1.8 mg/dL (ref 0.0–1.9)
MICROALB/CREAT RATIO: 3.5 mg/g (ref 0.0–30.0)

## 2016-08-12 LAB — VITAMIN D 25 HYDROXY (VIT D DEFICIENCY, FRACTURES): VITD: 21.73 ng/mL — ABNORMAL LOW (ref 30.00–100.00)

## 2016-08-12 LAB — URIC ACID: URIC ACID, SERUM: 6.7 mg/dL (ref 2.4–7.0)

## 2016-08-12 LAB — HEMOGLOBIN A1C: HEMOGLOBIN A1C: 6.8 % — AB (ref 4.6–6.5)

## 2016-08-12 NOTE — Progress Notes (Signed)
we discussed code status.  pt requests full code, but would not want to be started or maintained on artificial life-support measures if there was not a reasonable chance of recovery 

## 2016-08-12 NOTE — Patient Instructions (Addendum)
Please consider these measures for your health:  minimize alcohol.  Do not use tobacco products.  Have a colonoscopy at least every 10 years from age 71.  Women should have an annual mammogram from age 54.  Keep firearms safely stored.  Always use seat belts.  have working smoke alarms in your home.  See an eye doctor and dentist regularly.  Never drive under the influence of alcohol or drugs (including prescription drugs).  Those with fair skin should take precautions against the sun, and should carefully examine their skin once per month, for any new or changed moles. please let me know what your wishes would be, if artificial life support measures should become necessary.  It is critically important to prevent falling down (keep floor areas well-lit, dry, and free of loose objects.  If you have a cane, walker, or wheelchair, you should use it, even for short trips around the house.  Wear flat-soled shoes.  Also, try not to rush).   Please come back for a follow-up appointment in 6 months.

## 2016-08-12 NOTE — Progress Notes (Signed)
Subjective:    Patient ID: Amy Escobar, female    DOB: 09/24/1945, 71 y.o.   MRN: [id number]  HPI Pt is here for regular wellness examination, and is feeling pretty well in general, and says chronic med probs are stable, except as noted below Past Medical History:  Diagnosis Date  . Alkaline phosphatase deficiency    w/u Ne  . Allergic rhinitis   . Anxiety   . Arthritis   . DM2 (diabetes mellitus, type 2) ([place])   . Dyslipidemia   . GERD (gastroesophageal reflux disease)   . Gout   . Hemorrhoids   . Hyperlipidemia   . Hypertension   . Osteoporosis   . Thyroid nodule    small  . Uterine fibroid     Past Surgical History:  Procedure Laterality Date  . CATARACT EXTRACTION Bilateral 2016  . COLONOSCOPY  2007  . echocardiogram (other)  01/16/2002  . 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 stress cardiolite  02/12/2006  . TOENAIL EXCISION    . TUBAL LIGATION      Social History   Social History  . Marital status: Widowed    Spouse name: N/A  . Number of children: 2  . Years of education: N/A   Occupational History  . OTC CLERK Unemployed   Social History Main Topics  . Smoking status: Former Smoker    Packs/day: 0.25    Years: 5.00    Types: Cigarettes    Quit date: 06/29/1968  . Smokeless tobacco: Never Used  . Alcohol use No  . Drug use: No  . Sexual activity: Not on file   Other Topics Concern  . Not on file   Social History Narrative   Widowed 2010.     Current Outpatient Prescriptions on File Prior to Visit  Medication Sig Dispense Refill  . amLODipine (NORVASC) 2.5 MG tablet TAKE 1 TABLET BY MOUTH DAILY 90 tablet 2  . aspirin EC 81 MG tablet Take 81 mg by mouth daily.    Marland Kitchen azithromycin (ZITHROMAX) 500 MG tablet Take 1 tablet (500 mg total) by mouth daily. 5 tablet 0  . Cholecalciferol (VITAMIN D3) 2000 units TABS Take 2,000 Int'l Units by mouth. Study at [facility]    . gabapentin (NEURONTIN) 600 MG tablet TAKE 1 TABLET (600 MG TOTAL)  BY MOUTH 3 (THREE) TIMES DAILY. (Patient taking differently: Taking 600mg  QHS) 270 tablet 10  . metFORMIN (GLUCOPHAGE-XR) 500 MG 24 hr tablet TAKE TWO TABLETS BY MOUTH TWICE A DAY 360 tablet 0  . NEXIUM 40 MG capsule TAKE 1 CAPSULE (40 MG TOTAL) BY MOUTH DAILY BEFORE BREAKFAST. 90 capsule 8  . ONE TOUCH ULTRA TEST test strip USE TO TEST BLOOD SUGAR THREE TIMES A DAY 100 each 3  . promethazine-codeine (PHENERGAN WITH CODEINE) 6.25-10 MG/5ML syrup Take 5 mLs by mouth every 4 (four) hours as needed. 240 mL 1  . simvastatin (ZOCOR) 40 MG tablet TAKE 1 TABLET (40 MG TOTAL) BY MOUTH AT BEDTIME. 90 tablet 3  . UNKNOWN TO PATIENT Inject as directed every 6 (six) months. 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 valsartan-hydrochlorothiazide (DIOVAN-HCT) 160-12.5 MG tablet Take 1 tablet by mouth daily. 90 tablet 3   No current facility-administered medications on file prior to visit.     Allergies  Allergen Reactions  . Olmesartan Medoxomil     REACTION: headache    Family History  Problem Relation Age of Onset  .  Heart disease Mother   . Heart disease Father   . Heart disease Maternal Grandfather   . Rectal cancer Maternal Grandfather   . Stomach cancer Maternal Grandmother   . Colon cancer Neg Hx     BP 122/80   Pulse (!) 101   Ht 5\' 4"  (1.626 m)   Wt 172 lb (78 kg)   SpO2 95%   BMI 29.52 kg/m     Review of Systems Constitutional: Negative for fever.  HENT: Negative for hearing loss.   Eyes: Negative for visual disturbance.  Respiratory: Negative for shortness of breath.   Cardiovascular: Negative for chest pain.  Gastrointestinal: Negative for anal bleeding.  Endocrine: Negative for cold intolerance.  Genitourinary: Negative for hematuria.  Musculoskeletal: no change in chronic mild back pain.  Skin: Negative for rash.  Allergic/Immunologic: Positive for environmental allergies.  Neurological: Negative for syncope.  Hematological: pos for easy bruising.    Psychiatric/Behavioral: Negative for dysphoric mood.     Objective:   Physical Exam VS: see vs page GEN: no distress HEAD: head: no deformity eyes: no periorbital swelling, no proptosis external nose and ears are normal mouth: no lesion seen NECK: supple, thyroid is not enlarged CHEST WALL: no deformity LUNGS:  Clear to auscultation CV: reg rate and rhythm, no murmur ABD: abdomen is soft, nontender.  no hepatosplenomegaly.  not distended.  no hernia MUSCULOSKELETAL: muscle bulk and strength are grossly normal.  no obvious joint swelling.  gait is normal and steady EXTEMITIES: no deformity.  no ulcer on the feet.  feet are of normal color and temp.  no edema PULSES: dorsalis pedis intact bilat.  no carotid bruit NEURO:  cn 2-12 grossly intact.   readily moves all 4's.  sensation is intact to touch on the feet SKIN:  Normal texture and temperature.  No rash or suspicious lesion is visible.   NODES:  None palpable at the neck PSYCH: alert, well-oriented.  Does not appear anxious nor depressed.       Assessment & Plan:  Wellness visit today, with problems stable, except as noted.    Subjective:   Patient here for Medicare annual wellness visit and management of other chronic and acute problems.     Risk factors: advanced age    70 of Physicians Providing Medical Care to Patient:  See "snapshot"   Activities of Daily Living: In your present state of health, do you have any difficulty performing the following activities?:  Preparing food and eating?: No  Bathing yourself: No  Getting dressed: No  Using the toilet:No  Moving around from place to place: No  In the past year have you fallen or had a near fall?:No    Home Safety: Has smoke detector and wears seat belts. No firearms.   Diet and Exercise  Current exercise habits: pt says not very good.  Dietary issues discussed: pt reports a pretty healthy diet   Depression Screen  Q1: Over the past two weeks, have you  felt down, depressed or hopeless? no  Q2: Over the past two weeks, have you felt little interest or pleasure in doing things? no   The following portions of the patient's history were reviewed and updated as appropriate: allergies, current medications, past family history, past medical history, past social history, past surgical history and problem list.   Review of Systems  Denies hearing loss, and visual loss Objective:   Vision:  Advertising account executive, so she declines VA today Hearing: grossly normal Body mass index:  See vs page Msk: pt easily and quickly performs "get-up-and-go" from a sitting position Cognitive Impairment Assessment: cognition, memory and judgment appear normal.  remembers 3/3 at 5 minutes.  excellent recall.  can easily read and write a sentence.  alert and oriented x 3  Assessment:   Medicare wellness utd on preventive parameters.   Plan:   During the course of the visit the patient was educated and counseled about appropriate screening and preventive services including:       Fall prevention   Screening mammography  Bone densitometry screening  Diabetes screening  Nutrition counseling   Vaccines / LABS Zostavax / Pneumococcal Vaccine  today   Patient Instructions (the written plan) was given to the patient.

## 2016-08-13 LAB — PTH, INTACT AND CALCIUM
Calcium: 9.7 mg/dL (ref 8.6–10.4)
PTH: 37 pg/mL (ref 14–64)

## 2016-08-29 ENCOUNTER — Other Ambulatory Visit: Payer: Self-pay | Admitting: Endocrinology

## 2016-08-31 DIAGNOSIS — H33321 Round hole, right eye: Secondary | ICD-10-CM | POA: Diagnosis not present

## 2016-08-31 DIAGNOSIS — H43813 Vitreous degeneration, bilateral: Secondary | ICD-10-CM | POA: Diagnosis not present

## 2016-08-31 DIAGNOSIS — E119 Type 2 diabetes mellitus without complications: Secondary | ICD-10-CM | POA: Diagnosis not present

## 2016-08-31 DIAGNOSIS — H31009 Unspecified chorioretinal scars, unspecified eye: Secondary | ICD-10-CM | POA: Diagnosis not present

## 2016-08-31 DIAGNOSIS — H33101 Unspecified retinoschisis, right eye: Secondary | ICD-10-CM | POA: Diagnosis not present

## 2016-08-31 LAB — HM DIABETES EYE EXAM

## 2016-09-02 ENCOUNTER — Ambulatory Visit
Admission: RE | Admit: 2016-09-02 | Discharge: 2016-09-02 | Disposition: A | Discharge status: Home / Self Care | Payer: Medicare Other | Source: Ambulatory Visit | Attending: Endocrinology | Admitting: Endocrinology

## 2016-09-02 DIAGNOSIS — Z1231 Encounter for screening mammogram for malignant neoplasm of breast: Secondary | ICD-10-CM

## 2016-09-04 ENCOUNTER — Telehealth: Payer: Self-pay

## 2016-09-04 MED ORDER — LORATADINE-PSEUDOEPHEDRINE ER 5-120 MG PO TB12: 1.0000 | ORAL_TABLET | Freq: Every day | ORAL | 11 refills | Status: DC

## 2016-09-04 NOTE — Telephone Encounter (Signed)
Patient notified of message and voiced understanding.  

## 2016-09-04 NOTE — Telephone Encounter (Signed)
I have sent a prescription to your pharmacy--lower dosage is recommended for senior citizens.

## 2016-09-04 NOTE — Telephone Encounter (Signed)
Patient came by the office today to request a prescription for Claritin D. Patient stated she has been purchasing this med over the counter and stated it would be cheaper for her to get the medication if she had a prescription. Please advise if ok to send rx. Thanks!

## 2016-09-28 ENCOUNTER — Other Ambulatory Visit: Payer: Self-pay | Admitting: Endocrinology

## 2016-10-06 ENCOUNTER — Other Ambulatory Visit: Payer: Self-pay | Admitting: Pharmacy Technician

## 2016-10-06 NOTE — Patient Outreach (Addendum)
I contacted Amy Escobar to go over Medication Adherence. Also, to verify there are no barriers with receiving or taking her medications. Patient stated there are no barriers at this time.  [name], [place] [facility] [phone]

## 2016-10-07 ENCOUNTER — Telehealth: Payer: Self-pay | Admitting: Endocrinology

## 2016-10-07 MED ORDER — TRIAMCINOLONE ACETONIDE 0.1 % EX CREA: TOPICAL_CREAM | CUTANEOUS | 1 refills | Status: DC

## 2016-10-07 NOTE — Telephone Encounter (Signed)
Pt called in and said that the pharmacy is telling her that her cream was denied and that we never sent a new prescription, I saw where we actually did send it on 09/28/16, can you please resubmit the script for the Kenalog Cream to the [facility] at [place].

## 2016-10-07 NOTE — Telephone Encounter (Signed)
Rx resubmitted

## 2016-11-02 ENCOUNTER — Telehealth: Payer: Self-pay | Admitting: Cardiology

## 2016-11-02 NOTE — Telephone Encounter (Signed)
New Message   Pt c/o of Chest Pain: STAT if CP now or developed within 24 hours  1. Are you having CP right now? no  2. Are you experiencing any other symptoms (ex. SOB, nausea, vomiting, sweating)? Really bad gas, had pizza and soda - not sure if that is the reason but wants to be sure it is gas and not a heart attack  3. How long have you been experiencing CP? Last few days randomly  4. Is your CP continuous or coming and going? Coming and going  5. Have you taken Nitroglycerin? no ?

## 2016-11-02 NOTE — Telephone Encounter (Signed)
Pt calling in to inform Dr Meda Coffee that since yesterday at [facility], she drank a soda and ate a slice of pizza, and within the hour, started to develop intermittent mid-sternal chest pain, that radiated to her back area.   Pt states that a about 2 hours after that, it then started to develop in her left breast area and still across her back.  Pt states this does NOT radiate into her left jaw or arm.  Pt complains of no sob at rest.  Pt states she has mild DOE, but that could be from decreased exercise and activity level over the course of a couple months.   Pt states that she took 2 tylenol after feeling 2 hours of symptoms, and states "this was relieved then, and I slept well last night." Pt states that her symptoms are still intermittent today, but not as bad.   Pt states she would like to get an appt with Dr Meda Coffee or PA for tomorrow or Wednesday, to make sure everything is ok from a cardiac perspective, for she states she will be going out of town on this Thursday 5/10.  Offered the pt an appt to see Melina Copa PA-C on 5/9 at 1100.   Advised the pt that if her symptoms worsen and she starts having increased cp, sob, N/V, palpitations, dizziness, diaphoresis, pre-syncopal or syncopal episodes, then she should immediately report to the ER.  Informed the pt that I will still send this message to Dr Meda Coffee as an Juluis Rainier, and further recommendations if needed.  Pt verbalized understanding and agrees with this plan.

## 2016-11-03 ENCOUNTER — Encounter: Payer: Self-pay | Admitting: Physician Assistant

## 2016-11-03 DIAGNOSIS — I472 Ventricular tachycardia: Secondary | ICD-10-CM | POA: Insufficient documentation

## 2016-11-03 DIAGNOSIS — I4729 Other ventricular tachycardia: Secondary | ICD-10-CM | POA: Insufficient documentation

## 2016-11-03 DIAGNOSIS — I493 Ventricular premature depolarization: Secondary | ICD-10-CM | POA: Insufficient documentation

## 2016-11-03 DIAGNOSIS — I10 Essential (primary) hypertension: Secondary | ICD-10-CM | POA: Insufficient documentation

## 2016-11-03 DIAGNOSIS — I272 Pulmonary hypertension, unspecified: Secondary | ICD-10-CM | POA: Insufficient documentation

## 2016-11-03 NOTE — Progress Notes (Signed)
Cardiology Office Note    Date:  11/04/2016  ID:  Amy Escobar, DOB 08-Feb-1946, MRN [id number] PCP:  Renato Shin, MD  Cardiologist:  Dr. Meda Coffee   Chief Complaint: chest pain  History of Present Illness:  Amy Escobar is a 71 y.o. female with history of HTN, DM, HLD, PVCs/brief NSVT on remote monitor, atypical chest pain, anxiety, arthritism, GERD, gout, thyroid nodule who presents for evaluation of chest pain.  To recap, she was referred to cardiology for dyspnea in 2015 - she underwent GXT with poor exercise tolerance (2:41). Study was clinically/electrically negative for chest pain/ischemiabut test stopped due to dyspnea, PVCs, and over a minute run of bigeminal PVCs. Holter 09/14/13 showed NSR to sinus tach with occasional PVCs, at times in couplets/triplets and longest run of 5 beats (3.1% PVC burden). Subsequent echo 09/20/2013 showed EF 50-55%, grade 1 DD, mild MR, mild pulm HTN, nuc same day showed apical thinning defect and small area of inferolateral bowel artifact, EF 53%, low risk. Last echo 03/2014 showed mild LVH, EF 55-60%, no RWMA, grade 1 DD, mildly increased PASP 90mmHg and last nuc 01/2015 was normal. Most recent labs 07/2016 showed A1C 6.8, TSH wnl, BMET unremarkable with K 4.1, LDL 68, LFTs unremarkable except borderline elevated ALT. She has history of muscle pain with statins requiring reduction in simvastatin.  She presents for evaluation of intermittent chest pain. About a week ago she began noticing constant middle chest pain which was somewhat sharp, somewhat pressurelike in nature. It would be constant but would then come and go without particular pattern for 5-10 minutes at a time. Not worse with inspiration, palpation, meals, or exertion. She felt some radiation to her upper posterior shoulders. She has noticed some improvement after belching with Coca-Cola, Tums, and also Tylenol. The pain still comes and goes but is not as significant as it was last week. She  has chronic DOE, possibly more prominent than usual but she reports she's decreased her activity level in general over the last year. Used to walk regularly but now doesn't really exercise. This discomfort is somewhat similar to prior chest discomfort prompting above normal nuc. She relays history of finding first husband dead from MI in their bathroom. Second husband and mother also had MI so she is constantly on surveillance for making sure we are informed of chest pain.   Past Medical History:  Diagnosis Date  . Alkaline phosphatase deficiency    w/u Ne  . Allergic rhinitis   . Anxiety   . Arthritis   . DM2 (diabetes mellitus, type 2) ([place])   . GERD (gastroesophageal reflux disease)   . Gout   . Hemorrhoids   . Hyperlipidemia   . Hypertension   . Mild pulmonary hypertension ([place])   . NSVT (nonsustained ventricular tachycardia) ([place])   . Osteoporosis   . PVC's (premature ventricular contractions)   . Thyroid nodule    small  . Uterine fibroid     Past Surgical History:  Procedure Laterality Date  . CATARACT EXTRACTION Bilateral 2016  . COLONOSCOPY  2007  . echocardiogram (other)  01/16/2002  . 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 stress cardiolite  02/12/2006  . TOENAIL EXCISION    . TUBAL LIGATION      Current Medications: Current Outpatient Prescriptions  Medication Sig Dispense Refill  . amLODipine (NORVASC) 2.5 MG tablet TAKE 1 TABLET BY MOUTH DAILY 90 tablet 2  . aspirin EC 81 MG tablet Take 81 mg  by mouth daily.    Marland Kitchen azithromycin (ZITHROMAX) 500 MG tablet Take 1 tablet (500 mg total) by mouth daily. 5 tablet 0  . esomeprazole (NEXIUM) 40 MG capsule TAKE 1 CAPSULE (40 MG TOTAL) BY MOUTH DAILY BEFORE BREAKFAST. 90 capsule 7  . gabapentin (NEURONTIN) 600 MG tablet TAKE 1 TABLET (600 MG TOTAL) BY MOUTH 3 (THREE) TIMES DAILY. (Patient taking differently: Taking 600mg  QHS) 270 tablet 10  . loratadine-pseudoephedrine (CLARITIN-D 12-HOUR) 5-120 MG tablet Take 1 tablet  by mouth daily. 30 tablet 11  . metFORMIN (GLUCOPHAGE-XR) 500 MG 24 hr tablet TAKE TWO TABLETS BY MOUTH TWICE A DAY 360 tablet 0  . ONE TOUCH ULTRA TEST test strip USE TO TEST BLOOD SUGAR THREE TIMES A DAY 100 each 3  . promethazine-codeine (PHENERGAN WITH CODEINE) 6.25-10 MG/5ML syrup Take 5 mLs by mouth every 4 (four) hours as needed. 240 mL 1  . simvastatin (ZOCOR) 40 MG tablet TAKE 1 TABLET (40 MG TOTAL) BY MOUTH AT BEDTIME. 90 tablet 3  . triamcinolone cream (KENALOG) 0.1 % APPLY 1 APPLICATION TOPICALLY 4 (FOUR) TIMES DAILY AS NEEDED FOR RASH 45 g 1  . UNKNOWN TO PATIENT Inject as directed every 6 (six) months. 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 valsartan-hydrochlorothiazide (DIOVAN-HCT) 160-12.5 MG tablet Take 1 tablet by mouth daily. 90 tablet 3   No current facility-administered medications for this visit.      Allergies:   Olmesartan medoxomil   Social History   Social History  . Marital status: Widowed    Spouse name: N/A  . Number of children: 2  . Years of education: N/A   Occupational History  . OTC CLERK Unemployed   Social History Main Topics  . Smoking status: Former Smoker    Packs/day: 0.25    Years: 5.00    Types: Cigarettes    Quit date: 06/29/1968  . Smokeless tobacco: Never Used  . Alcohol use No  . Drug use: No  . Sexual activity: Not Asked   Other Topics Concern  . None   Social History Narrative   Widowed 2010.      Family History:  Family History  Problem Relation Age of Onset  . Heart disease Mother   . Heart disease Father   . Heart disease Maternal Grandfather   . Rectal cancer Maternal Grandfather   . Stomach cancer Maternal Grandmother   . Colon cancer Neg Hx   . Breast cancer Neg Hx     ROS:   Please see the history of present illness.  All other systems are reviewed and otherwise negative.    PHYSICAL EXAM:   VS:  BP 104/64   Pulse 83   Ht 5\' 4"  (1.626 m)   Wt 169 lb (76.7 kg)   SpO2 98%   BMI 29.01  kg/m   BMI: Body mass index is 29.01 kg/m. GEN: Well nourished, well developed AAF, in no acute distress, jovial, lively HEENT: normocephalic, atraumatic Neck: no JVD, carotid bruits, or masses Cardiac: RRR; no murmurs, rubs, or gallops, no edema  Respiratory:  clear to auscultation bilaterally, normal work of breathing GI: soft, nontender, nondistended, + BS MS: no deformity or atrophy  Skin: warm and dry, no rash Neuro:  Alert and Oriented x 3, Strength and sensation are intact, follows commands Psych: euthymic mood, full affect  Wt Readings from Last 3 Encounters:  11/04/16 169 lb (76.7 kg)  08/12/16 172 lb (78 kg)  08/07/16 175 lb (  79.4 kg)      Studies/Labs Reviewed:   EKG:  EKG was ordered today and personally reviewed by me and demonstrates NSR 83bpm, nonspecific ST-T changes, no acute changes from prior.  Recent Labs: 08/12/2016: ALT 38; BUN 10; Creatinine, Ser 0.87; Hemoglobin 13.2; Platelets 197.0; Potassium 4.1; Sodium 137; TSH 2.27   Lipid Panel    Component Value Date/Time   CHOL 125 08/12/2016 0818   TRIG 111.0 08/12/2016 0818   TRIG 48 05/03/2006 0733   HDL 34.90 (L) 08/12/2016 0818   CHOLHDL 4 08/12/2016 0818   VLDL 22.2 08/12/2016 0818   LDLCALC 68 08/12/2016 0818   LDLDIRECT 165.6 06/19/2009 0731    Additional studies/ records that were reviewed today include: Summarized above.    ASSESSMENT & PLAN:   1. Chest pain, atypical - mixed features, mostly atypical, possibly related to GERD but she does have significant risk factors for CAD including HTN, HLD, DM. Prior noninvasive testing unrevealing. Given her history and habitus I think she would be a good candidate for a cardiac CT to further evaluate. Continue ASA. Continue PPI. Also has noticed slight worsening of DOE. Check CMET, CBC, lipase today to exclude obvious metabolic abnormality contributing to sx. ER precautions reviewed. 2. H/o PVCs - quiescent, do not seem to be causing symptoms at present  time. Fairly low PVC burden in 2015. 3. Essential HTN - controlled, follow. 4. Hyperlipidemia - potential interaction noted between amlodipine and simvastatin. FDA recommends no more than simvastatin 20mg  with amlodipine. Will change to atorvastatin 20mg  daily. Did not choose higher dose due to h/o abnormal LFTs. Will check liver/lipids in 6 weeks. 5. Mild pulm HTN - follow clinically.   Disposition: F/u with myself or Dr. Rudi Coco team APP after above testing.  Medication Adjustments/Labs and Tests Ordered: Current medicines are reviewed at length with the patient today.  Concerns regarding medicines are outlined above. Medication changes, Labs and Tests ordered today are summarized above and listed in the Patient Instructions accessible in Encounters.   Raechel Ache PA-C  11/04/2016 11:31 AM    [facility] [place], [place], [place]  [address] Phone: [phone]; Fax: [phone]

## 2016-11-04 ENCOUNTER — Ambulatory Visit (INDEPENDENT_AMBULATORY_CARE_PROVIDER_SITE_OTHER): Payer: Medicare Other | Admitting: Physician Assistant

## 2016-11-04 ENCOUNTER — Encounter: Payer: Self-pay | Admitting: Physician Assistant

## 2016-11-04 VITALS — BP 104/64 | HR 83 | Ht 64.0 in | Wt 169.0 lb

## 2016-11-04 DIAGNOSIS — I10 Essential (primary) hypertension: Secondary | ICD-10-CM | POA: Diagnosis not present

## 2016-11-04 DIAGNOSIS — R0789 Other chest pain: Secondary | ICD-10-CM

## 2016-11-04 DIAGNOSIS — R0609 Other forms of dyspnea: Secondary | ICD-10-CM

## 2016-11-04 DIAGNOSIS — E785 Hyperlipidemia, unspecified: Secondary | ICD-10-CM

## 2016-11-04 DIAGNOSIS — I493 Ventricular premature depolarization: Secondary | ICD-10-CM

## 2016-11-04 DIAGNOSIS — I272 Pulmonary hypertension, unspecified: Secondary | ICD-10-CM

## 2016-11-04 MED ORDER — ATORVASTATIN CALCIUM 20 MG PO TABS: 20.0000 mg | ORAL_TABLET | Freq: Every day | ORAL | 3 refills | Status: DC

## 2016-11-04 NOTE — Patient Instructions (Addendum)
Medication Instructions:  Your physician has recommended you make the following change in your medication:  1.  STOP the Simvastatin 2.  START the Atorvastatin 20 mg taking 1 tablet at night time   Labwork: TODAY:  CMET, LIPIASE, & CBC 6 WEEKS:  FASTING LIPID & LFT  Testing/Procedures: Your physician has requested that you have cardiac CT. Cardiac computed tomography (CT) is a painless test that uses an x-ray machine to take clear, detailed pictures of your heart. For further information please visit [email or web address]. Please follow instruction sheet as given.     Follow-Up: Your physician recommends that you schedule a follow-up appointment in: WITH DAYNA DUNN, PA-C AFTER TESTING IS COMPLETE   Any Other Special Instructions Will Be Listed Below (If Applicable).    Cardiac CT  A cardiac CT angiogram is a procedure to look at the heart and the area around the heart. It may be done to help find the cause of chest pains or other symptoms of heart disease. During this procedure, a large X-ray machine, called a CT scanner, takes detailed pictures of the heart and the surrounding area after a dye (contrast material) has been injected into blood vessels in the area. The procedure is also sometimes called a coronary CT angiogram, coronary artery scanning, or CTA. A cardiac CT angiogram allows the health care provider to see how well blood is flowing to and from the heart. The health care provider will be able to see if there are any problems, such as:  Blockage or narrowing of the coronary arteries in the heart.  Fluid around the heart.  Signs of weakness or disease in the muscles, valves, and tissues of the heart. Tell a health care provider about:  Any allergies you have. This is especially important if you have had a previous allergic reaction to contrast dye.  All medicines you are taking, including vitamins, herbs, eye drops, creams, and over-the-counter medicines.  Any blood  disorders you have.  Any surgeries you have had.  Any medical conditions you have.  Whether you are pregnant or may be pregnant.  Any anxiety disorders, chronic pain, or other conditions you have that may increase your stress or prevent you from lying still. What are the risks? Generally, this is a safe procedure. However, problems may occur, including:  Bleeding.  Infection.  Allergic reactions to medicines or dyes.  Damage to other structures or organs.  Kidney damage from the dye or contrast that is used.  Increased risk of cancer from radiation exposure. This risk is low. Talk with your health care provider about:  The risks and benefits of testing.  How you can receive the lowest dose of radiation. What happens before the procedure?  Wear comfortable clothing and remove any jewelry, glasses, dentures, and hearing aids.  Follow instructions from your health care provider about eating and drinking. This may include:  For 12 hours before the test - avoid caffeine. This includes tea, coffee, soda, energy drinks, and diet pills. Drink plenty of water or other fluids that do not have caffeine in them. Being well-hydrated can prevent complications.  For 4-6 hours before the test - stop eating and drinking. The contrast dye can cause nausea, but this is less likely if your stomach is empty.  Ask your health care provider about changing or stopping your regular medicines. This is especially important if you are taking diabetes medicines, blood thinners, or medicines to treat erectile dysfunction. What happens during the procedure?  Hair on your  chest may need to be removed so that small sticky patches called electrodes can be placed on your chest. These will transmit information that helps to monitor your heart during the test.  An IV tube will be inserted into one of your veins.  You might be given a medicine to control your heart rate during the test. This will help to  ensure that good images are obtained.  You will be asked to lie on an exam table. This table will slide in and out of the CT machine during the procedure.  Contrast dye will be injected into the IV tube. You might feel warm, or you may get a metallic taste in your mouth.  You will be given a medicine (nitroglycerin) to relax (dilate) the arteries in your heart.  The table that you are lying on will move into the CT machine tunnel for the scan.  The person running the machine will give you instructions while the scans are being done. You may be asked to:  Keep your arms above your head.  Hold your breath.  Stay very still, even if the table is moving.  When the scanning is complete, you will be moved out of the machine.  The IV tube will be removed. The procedure may vary among health care providers and hospitals. What happens after the procedure?  You might feel warm, or you may get a metallic taste in your mouth from the contrast dye.  You may have a headache from the nitroglycerin.  After the procedure, drink water or other fluids to wash (flush) the contrast material out of your body.  Contact a health care provider if you have any symptoms of allergy to the contrast. These symptoms include:  Shortness of breath.  Rash or hives.  A racing heartbeat.  Most people can return to their normal activities right after the procedure. Ask your health care provider what activities are safe for you.  It is up to you to get the results of your procedure. Ask your health care provider, or the department that is doing the procedure, when your results will be ready. Summary  A cardiac CT angiogram is a procedure to look at the heart and the area around the heart. It may be done to help find the cause of chest pains or other symptoms of heart disease.  During this procedure, a large X-ray machine, called a CT scanner, takes detailed pictures of the heart and the surrounding area after  a dye (contrast material) has been injected into blood vessels in the area.  Ask your health care provider about changing or stopping your regular medicines before the procedure. This is especially important if you are taking diabetes medicines, blood thinners, or medicines to treat erectile dysfunction.  After the procedure, drink water or other fluids to wash (flush) the contrast material out of your body. This information is not intended to replace advice given to you by your health care provider. Make sure you discuss any questions you have with your health care provider. Document Released: 05/28/2008 Document Revised: 05/04/2016 Document Reviewed: 05/04/2016 Elsevier Interactive Patient Education  2017 [employer].  If you need a refill on your cardiac medications before your next appointment, please call your pharmacy.

## 2016-11-05 ENCOUNTER — Telehealth: Payer: Self-pay | Admitting: Physician Assistant

## 2016-11-05 DIAGNOSIS — Z79899 Other long term (current) drug therapy: Secondary | ICD-10-CM

## 2016-11-05 LAB — CBC
HEMATOCRIT: 38 % (ref 34.0–46.6)
HEMOGLOBIN: 12.3 g/dL (ref 11.1–15.9)
MCH: 27.8 pg (ref 26.6–33.0)
MCHC: 32.4 g/dL (ref 31.5–35.7)
MCV: 86 fL (ref 79–97)
Platelets: 196 10*3/uL (ref 150–379)
RBC: 4.42 x10E6/uL (ref 3.77–5.28)
RDW: 14.1 % (ref 12.3–15.4)
WBC: 6.9 10*3/uL (ref 3.4–10.8)

## 2016-11-05 LAB — COMPREHENSIVE METABOLIC PANEL
ALBUMIN: 4.2 g/dL (ref 3.5–4.8)
ALT: 43 IU/L — AB (ref 0–32)
AST: 49 IU/L — ABNORMAL HIGH (ref 0–40)
Albumin/Globulin Ratio: 1.2 (ref 1.2–2.2)
Alkaline Phosphatase: 93 IU/L (ref 39–117)
BUN / CREAT RATIO: 12 (ref 12–28)
BUN: 11 mg/dL (ref 8–27)
Bilirubin Total: 0.4 mg/dL (ref 0.0–1.2)
CALCIUM: 9.5 mg/dL (ref 8.7–10.3)
CHLORIDE: 100 mmol/L (ref 96–106)
CO2: 24 mmol/L (ref 18–29)
CREATININE: 0.9 mg/dL (ref 0.57–1.00)
GFR, EST AFRICAN AMERICAN: 74 mL/min/{1.73_m2} (ref >59)
GFR, EST NON AFRICAN AMERICAN: 65 mL/min/{1.73_m2} (ref >59)
GLUCOSE: 186 mg/dL — AB (ref 65–99)
Globulin, Total: 3.4 g/dL (ref 1.5–4.5)
Potassium: 4.3 mmol/L (ref 3.5–5.2)
Sodium: 140 mmol/L (ref 134–144)
TOTAL PROTEIN: 7.6 g/dL (ref 6.0–8.5)

## 2016-11-05 LAB — LIPASE: Lipase: 31 U/L (ref 14–85)

## 2016-11-05 NOTE — Telephone Encounter (Signed)
New Message ° ° pt verbalized that she is returning call for rn °

## 2016-11-05 NOTE — Telephone Encounter (Signed)
-----   Message from Charlie Pitter, [place] sent at 11/05/2016  7:45 AM EDT ----- (See note below - we may end up starting atorvastatin once LFTs are stable so do not throw med away.)

## 2016-11-05 NOTE — Telephone Encounter (Signed)
Pt aware of her lab results. She will hold off on starting the Atorvastatin. She will repeat LFT when she comes in for her CT. Pt was advised that when they called to schedule her CT, the want know she needs labs on the same day, but I have put the order in and for her to let them know when she checks in.

## 2016-11-11 ENCOUNTER — Other Ambulatory Visit: Payer: Self-pay | Admitting: Endocrinology

## 2016-11-12 ENCOUNTER — Other Ambulatory Visit: Payer: Self-pay | Admitting: Endocrinology

## 2016-11-13 ENCOUNTER — Telehealth: Payer: Self-pay | Admitting: Cardiology

## 2016-11-13 ENCOUNTER — Encounter: Payer: Self-pay | Admitting: Cardiology

## 2016-11-13 NOTE — Telephone Encounter (Signed)
Dr. Meda Coffee,  On 11-04-16 Dunn PA Meda Coffee) ordered Cardiac Ct for Chest Pain.  See office note on 11-04-16  Chest pain, atypical - mixed features, mostly atypical, possibly related to GERD but she does have significant risk factors for CAD including HTN, HLD, DM. Prior noninvasive testing unrevealing. Given her history and habitus I think she would be a good candidate for a cardiac CT to further evaluate. Continue ASA. Continue PPI. Also has noticed slight worsening of DOE. Check CMET, CBC, lipase today to exclude obvious metabolic abnormality contributing to sx. ER precautions reviewed.   Please review and advise.

## 2016-11-13 NOTE — Telephone Encounter (Signed)
Will send this message to Mack Guise, to proceed with scheduling this pts cardiac CT, as okayed by Dr Meda Coffee.  Ivin Booty to arrange this appt.

## 2016-11-13 NOTE — Telephone Encounter (Signed)
Please order, thank you 

## 2016-11-16 ENCOUNTER — Other Ambulatory Visit: Payer: Self-pay | Admitting: Endocrinology

## 2016-11-17 DIAGNOSIS — R3914 Feeling of incomplete bladder emptying: Secondary | ICD-10-CM | POA: Diagnosis not present

## 2016-11-17 DIAGNOSIS — R350 Frequency of micturition: Secondary | ICD-10-CM | POA: Diagnosis not present

## 2016-11-18 ENCOUNTER — Ambulatory Visit (HOSPITAL_COMMUNITY)
Admission: RE | Admit: 2016-11-18 | Discharge: 2016-11-18 | Disposition: A | Discharge status: Home / Self Care | Payer: Medicare Other | Source: Ambulatory Visit | Attending: Physician Assistant | Admitting: Physician Assistant

## 2016-11-18 ENCOUNTER — Ambulatory Visit (HOSPITAL_COMMUNITY): Admission: RE | Admit: 2016-11-18 | Payer: Medicare Other | Source: Ambulatory Visit

## 2016-11-18 ENCOUNTER — Encounter (HOSPITAL_COMMUNITY): Payer: Self-pay

## 2016-11-18 DIAGNOSIS — K760 Fatty (change of) liver, not elsewhere classified: Secondary | ICD-10-CM | POA: Diagnosis not present

## 2016-11-18 DIAGNOSIS — R0789 Other chest pain: Secondary | ICD-10-CM | POA: Diagnosis not present

## 2016-11-18 DIAGNOSIS — R079 Chest pain, unspecified: Secondary | ICD-10-CM | POA: Diagnosis not present

## 2016-11-18 DIAGNOSIS — I700 Atherosclerosis of aorta: Secondary | ICD-10-CM | POA: Insufficient documentation

## 2016-11-18 MED ORDER — NITROGLYCERIN 0.4 MG SL SUBL
SUBLINGUAL_TABLET | SUBLINGUAL | Status: AC
  Administered 2016-11-18: 0.8 mg
  Filled 2016-11-18: qty 2

## 2016-11-18 MED ORDER — METOPROLOL TARTRATE 5 MG/5ML IV SOLN
INTRAVENOUS | Status: AC
  Filled 2016-11-18: qty 5

## 2016-11-18 MED ORDER — METOPROLOL TARTRATE 5 MG/5ML IV SOLN
INTRAVENOUS | Status: AC
  Administered 2016-11-18: 5 mg
  Filled 2016-11-18: qty 5

## 2016-11-18 MED ORDER — METOPROLOL TARTRATE 5 MG/5ML IV SOLN
5.0000 mg | INTRAVENOUS | Status: DC
  Administered 2016-11-18: 5 mg via INTRAVENOUS

## 2016-11-18 MED ORDER — IOPAMIDOL (ISOVUE-370) INJECTION 76%
INTRAVENOUS | Status: AC
  Administered 2016-11-18: 80 mL
  Filled 2016-11-18: qty 100

## 2016-11-18 MED ORDER — NITROGLYCERIN 0.4 MG SL SUBL
0.8000 mg | SUBLINGUAL_TABLET | Freq: Once | SUBLINGUAL | Status: AC
  Administered 2016-11-18: 0.8 mg via SUBLINGUAL

## 2016-11-24 ENCOUNTER — Ambulatory Visit (INDEPENDENT_AMBULATORY_CARE_PROVIDER_SITE_OTHER): Payer: Medicare Other | Admitting: Physician Assistant

## 2016-11-24 ENCOUNTER — Encounter: Payer: Self-pay | Admitting: Cardiology

## 2016-11-24 VITALS — BP 124/74 | HR 80 | Ht 64.5 in | Wt 169.1 lb

## 2016-11-24 DIAGNOSIS — R0789 Other chest pain: Secondary | ICD-10-CM

## 2016-11-24 DIAGNOSIS — E7800 Pure hypercholesterolemia, unspecified: Secondary | ICD-10-CM

## 2016-11-24 DIAGNOSIS — R0609 Other forms of dyspnea: Secondary | ICD-10-CM

## 2016-11-24 DIAGNOSIS — R748 Abnormal levels of other serum enzymes: Secondary | ICD-10-CM | POA: Diagnosis not present

## 2016-11-24 MED ORDER — AMLODIPINE BESYLATE 5 MG PO TABS: 5.0000 mg | ORAL_TABLET | Freq: Every day | ORAL | 2 refills | Status: DC

## 2016-11-24 NOTE — Patient Instructions (Addendum)
Medication Instructions:    START TAKING AMLODIPINE  5 MG ONCE A DAY   If you need a refill on your cardiac medications before your next appointment, please call your pharmacy.  Labwork: LFT TODAY     Testing/Procedures: NONE ORDERED  TODAY    Follow-Up: IN 4 WEEKS WITH DR NELSON    Any Other Special Instructions Will Be Listed Below (If Applicable).

## 2016-11-24 NOTE — Progress Notes (Signed)
Cardiology Office Note    Date:  11/24/2016   ID:  SWEDEN LESURE, DOB 06/25/1946, MRN [id number]  PCP:  Renato Shin, MD  Cardiologist:  Dr. Meda Coffee  Chief Complaint: CT follow up  History of Present Illness:   Amy Escobar is a 71 y.o. female with hx of HTN, DM, HLD, PVCs/brief NSVT on remote monitor, atypical chest pain, anxiety, arthritism, GERD, gout, thyroid nodule  pesent for discussion of coronary CTA.  His and was seen by [facility] 11/05/82 makes atypical chest pain. Possibly related to GERD. However patient had a significant risk factors and follow-up coronary CTA recommended. This showed coronary calcium score of 220. Moderate plaque in ostial LM and mild tracking ostial RCA. FFR did not show significant stenosis. Recently discontinued simvastatin due to elevated LFTs.  Previously on Lipitor and Crestor. Patient says that it was discontinued by PCP. Unknown reason.  Here for further discussion. She continues to have intermittent sharp chest tightness with dyspnea. No orthopnea, PND, syncope,  palpitation, dizziness, melena. No regular exercise.    Past Medical History:  Diagnosis Date  . Alkaline phosphatase deficiency    w/u Ne  . Allergic rhinitis   . Anxiety   . Arthritis   . DM2 (diabetes mellitus, type 2) ([place])   . GERD (gastroesophageal reflux disease)   . Gout   . Hemorrhoids   . Hyperlipidemia   . Hypertension   . Mild pulmonary hypertension ([place])   . NSVT (nonsustained ventricular tachycardia) ([place])   . Osteoporosis   . PVC's (premature ventricular contractions)   . Thyroid nodule    small  . Uterine fibroid     Past Surgical History:  Procedure Laterality Date  . CATARACT EXTRACTION Bilateral 2016  . COLONOSCOPY  2007  . echocardiogram (other)  01/16/2002  . 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 stress cardiolite  02/12/2006  . TOENAIL EXCISION    . TUBAL LIGATION      Current Medications: Prior to Admission medications     Medication Sig Start Date End Date Taking? Authorizing Provider  amLODipine (NORVASC) 2.5 MG tablet TAKE 1 TABLET BY MOUTH DAILY 08/07/16  Yes Amy Spark, MD  aspirin EC 81 MG tablet Take 81 mg by mouth daily.   Yes [provider]  azithromycin (ZITHROMAX) 500 MG tablet Take 1 tablet (500 mg total) by mouth daily. 08/07/16  Yes Renato Shin, MD  Cephalexin (KEFLEX PO) Take 1 capsule by mouth 3 (three) times daily.   Yes [provider]  esomeprazole (NEXIUM) 40 MG capsule TAKE 1 CAPSULE (40 MG TOTAL) BY MOUTH DAILY BEFORE BREAKFAST. 08/29/16  Yes Renato Shin, MD  gabapentin (NEURONTIN) 600 MG tablet TAKE 1 TABLET (600 MG TOTAL) BY MOUTH 3 (THREE) TIMES DAILY. 11/16/16  Yes Renato Shin, MD  loratadine-pseudoephedrine (CLARITIN-D 12-HOUR) 5-120 MG tablet Take 1 tablet by mouth daily. 09/04/16  Yes Renato Shin, MD  metFORMIN (GLUCOPHAGE-XR) 500 MG 24 hr tablet TAKE TWO TABLETS BY MOUTH TWICE A DAY 11/11/16  Yes Renato Shin, MD  ONE TOUCH ULTRA TEST test strip USE TO TEST BLOOD SUGAR THREE TIMES A DAY 05/14/16  Yes Renato Shin, MD  promethazine-codeine [facility] WITH CODEINE) 6.25-10 MG/5ML syrup Take 5 mLs by mouth every 4 (four) hours as needed for cough.   Yes [provider]  triamcinolone cream (KENALOG) 0.1 % APPLY 1 APPLICATION TOPICALLY 4 (FOUR) TIMES DAILY AS NEEDED FOR RASH 10/07/16  Yes Renato Shin, MD  UNKNOWN TO  PATIENT Inject as directed every 6 (six) months. Injection for arthritis in right and left knees   Yes [provider]  valsartan-hydrochlorothiazide (DIOVAN-HCT) 160-12.5 MG tablet Take 1 tablet by mouth daily. 03/16/16  Yes Renato Shin, MD    Allergies:   Olmesartan medoxomil   Social History   Social History  . Marital status: Widowed    Spouse name: N/A  . Number of children: 2  . Years of education: N/A   Occupational History  . OTC CLERK Unemployed   Social History Main Topics  . Smoking status: Former Smoker     Packs/day: 0.25    Years: 5.00    Types: Cigarettes    Quit date: 06/29/1968  . Smokeless tobacco: Never Used  . Alcohol use No  . Drug use: No  . Sexual activity: Not Asked   Other Topics Concern  . None   Social History Narrative   Widowed 2010.      Family History:  The patient's family history includes Heart disease in her father, maternal grandfather, and mother; Rectal cancer in her maternal grandfather; Stomach cancer in her maternal grandmother.   ROS:   Please see the history of present illness.    ROS All other systems reviewed and are negative.   PHYSICAL EXAM:   VS:  BP 124/74   Pulse 80   Ht 5' 4.5" (1.638 m)   Wt 169 lb 1.9 oz (76.7 kg)   BMI 28.58 kg/m    GEN: Well nourished, well developed, in no acute distress  HEENT: normal  Neck: no JVD, carotid bruits, or masses Cardiac: RRR; no murmurs, rubs, or gallops,no edema  Respiratory:  clear to auscultation bilaterally, normal work of breathing GI: soft, nontender, nondistended, + BS MS: no deformity or atrophy  Skin: warm and dry, no rash Neuro:  Alert and Oriented x 3, Strength and sensation are intact Psych: euthymic mood, full affect  Wt Readings from Last 3 Encounters:  11/24/16 169 lb 1.9 oz (76.7 kg)  11/04/16 169 lb (76.7 kg)  08/12/16 172 lb (78 kg)      Studies/Labs Reviewed:   EKG:  EKG is not  ordered today.    Recent Labs: 08/12/2016: Hemoglobin 13.2; TSH 2.27 11/04/2016: ALT 43; BUN 11; Creatinine, Ser 0.90; Platelets 196; Potassium 4.3; Sodium 140   Lipid Panel    Component Value Date/Time   CHOL 125 08/12/2016 0818   TRIG 111.0 08/12/2016 0818   TRIG 48 05/03/2006 0733   HDL 34.90 (L) 08/12/2016 0818   CHOLHDL 4 08/12/2016 0818   VLDL 22.2 08/12/2016 0818   LDLCALC 68 08/12/2016 0818   LDLDIRECT 165.6 06/19/2009 0731    Additional studies/ records that were reviewed today include:   As above   ASSESSMENT & PLAN:    1. Chest pain, Unspecified - CT Coronary of chest  did not show significant stenosis by FFR. She continued to have intermittent symptoms. Questionable related to coronary spasm. Patient states that she felt better when Dr. Meda Coffee placed on on Norvasc 2.5 mg previously but not now. Discussed regular exercise for at least 30 minutes a day or water aerobics. Seems her symptoms could be related to deconditioning.  2. Hypertension - Stable and well controlled on current regimen.  3. Hyperlipidemia - 08/12/2016: Cholesterol 125; HDL 34.90; LDL Cholesterol 68; Triglycerides 111.0; VLDL 22.2  - Previously discontinued Lipitor and Crestor by PCP (unknown reason). Discontinued simvastatin 20 mg daily due to elevated LFT. Recheck liver function panel today.  If still elevated follow-up with PCP for GI evaluation. If normal lipid clinic referral.    Medication Adjustments/Labs and Tests Ordered: Current medicines are reviewed at length with the patient today.  Concerns regarding medicines are outlined above.  Medication changes, Labs and Tests ordered today are listed in the Patient Instructions below. Patient Instructions  Medication Instructions:    START TAKING AMLODIPINE  5 MG ONCE A DAY   If you need a refill on your cardiac medications before your next appointment, please call your pharmacy.  Labwork: LFT TODAY     Testing/Procedures: NONE ORDERED  TODAY    Follow-Up: IN 4 WEEKS WITH DR NELSON    Any Other Special Instructions Will Be Listed Below (If Applicable).                                                                                                                                                      Jarrett Soho, [place]  11/24/2016 2:50 PM    [facility] [address], [place], [place]  [address] Phone: [phone]; Fax: [phone]

## 2016-11-25 ENCOUNTER — Ambulatory Visit: Payer: Medicare Other | Admitting: Cardiology

## 2016-11-25 LAB — HEPATIC FUNCTION PANEL
ALBUMIN: 4.5 g/dL (ref 3.5–4.8)
ALT: 39 IU/L — ABNORMAL HIGH (ref 0–32)
AST: 32 IU/L (ref 0–40)
Alkaline Phosphatase: 113 IU/L (ref 39–117)
BILIRUBIN TOTAL: 0.2 mg/dL (ref 0.0–1.2)
BILIRUBIN, DIRECT: 0.1 mg/dL (ref 0.00–0.40)
Total Protein: 7.8 g/dL (ref 6.0–8.5)

## 2016-12-16 ENCOUNTER — Other Ambulatory Visit: Payer: Medicare Other

## 2016-12-18 ENCOUNTER — Other Ambulatory Visit: Payer: Medicare Other | Admitting: *Deleted

## 2016-12-18 DIAGNOSIS — R0789 Other chest pain: Secondary | ICD-10-CM | POA: Diagnosis not present

## 2016-12-18 DIAGNOSIS — E785 Hyperlipidemia, unspecified: Secondary | ICD-10-CM | POA: Diagnosis not present

## 2016-12-18 DIAGNOSIS — Z79899 Other long term (current) drug therapy: Secondary | ICD-10-CM

## 2016-12-19 LAB — SPECIMEN STATUS

## 2016-12-22 ENCOUNTER — Telehealth: Payer: Self-pay | Admitting: Endocrinology

## 2016-12-22 ENCOUNTER — Telehealth: Payer: Self-pay

## 2016-12-22 ENCOUNTER — Telehealth: Payer: Self-pay | Admitting: Cardiology

## 2016-12-22 MED ORDER — ATORVASTATIN CALCIUM 20 MG PO TABS: 20.0000 mg | ORAL_TABLET | Freq: Every day | ORAL | 3 refills | Status: DC

## 2016-12-22 NOTE — Telephone Encounter (Signed)
Called and LVM advising of MD's note. Gave call back number if any questions.

## 2016-12-22 NOTE — Telephone Encounter (Signed)
Reviewed labs with pt who states understanding.  She she an appt on Thursday and will f/u then.

## 2016-12-22 NOTE — Telephone Encounter (Signed)
Patient is trying to figure out when and why provider took her off Lipitor. Needs information for heart doctor. Asked for return phone call when available.

## 2016-12-22 NOTE — Telephone Encounter (Signed)
New message     Pt is calling back to get her lab results

## 2016-12-22 NOTE — Telephone Encounter (Signed)
I reviewed chart.  It looks like they mean for you to continue it.  I have sent a prescription to your pharmacy

## 2016-12-22 NOTE — Telephone Encounter (Signed)
Please advise of message below,. Thank you!

## 2016-12-24 ENCOUNTER — Ambulatory Visit (INDEPENDENT_AMBULATORY_CARE_PROVIDER_SITE_OTHER): Payer: Medicare Other | Admitting: Cardiology

## 2016-12-24 ENCOUNTER — Encounter: Payer: Self-pay | Admitting: Cardiology

## 2016-12-24 VITALS — BP 108/60 | HR 82 | Ht 64.5 in | Wt 163.1 lb

## 2016-12-24 DIAGNOSIS — I2510 Atherosclerotic heart disease of native coronary artery without angina pectoris: Secondary | ICD-10-CM

## 2016-12-24 DIAGNOSIS — I739 Peripheral vascular disease, unspecified: Secondary | ICD-10-CM | POA: Diagnosis not present

## 2016-12-24 DIAGNOSIS — E782 Mixed hyperlipidemia: Secondary | ICD-10-CM | POA: Diagnosis not present

## 2016-12-24 NOTE — Patient Instructions (Addendum)
Medication Instructions:     If you need a refill on your cardiac medications before your next appointment, please call your pharmacy.  Labwork: NONE ORDERED  TODAY    Testing/Procedures: Your physician has requested that you have a lower extremity arterial exercise duplex. During this test, exercise and ultrasound are used to evaluate arterial blood flow in the legs. Allow one hour for this exam. There are no restrictions or special instructions.    Follow-Up:   NEED REFERRAL TO LIPID CLINIC FOR PSK - 9  AS SOON AS POSSIBLE   Your physician wants you to follow-up in:  [place] will receive a reminder letter in the mail two months in advance. If you don't receive a letter, please call our office to schedule the follow-up appointment.   Any Other Special Instructions Will Be Listed Below (If Applicable).

## 2016-12-24 NOTE — Progress Notes (Signed)
12/24/2016 Amy Escobar   May 21, 1946  [id number]  Primary Physician Renato Shin, MD Primary Cardiologist: Dr. Meda Coffee    Reason for Visit/CC: f/u for CAD and Chest Pain   HPI:  Amy Escobar is a 71 y.o. female who is being seen today for f/u for CAD and chest pain. She is followed by Dr. Meda Coffee. She was recently evaluated for atypical chest pain. She had a NST in 2016 that was negative for ischemia. She has multiple cardiac risk factors including HTN, HLD with recent LDL at 152 (not on statins given abnormal HFTs) as well as a h/o DM. Given her recent recurrent CP and multitude cardiac risk factors, it was recommended that she be evaluate with a coronary CTA with calcium score to better assess her risk. Her calcium score was elevated at 220. She was noted to have Moderate plaque in the ostial LM and mild plaque of the ostial RCA. FFR did not show significant stenosis. It was felt that she may have possible coronary vasospasm, thus her amlodipine was increased to 5 mg daily.   She presents back for f/u. Her CP has resolved with increased dose of amlodipine. BP remains stable. She denies any exertional CP or dyspnea, however she has noticed bilateral hip pain with ambulation, concerning for claudication. Pain resolves with rest and she has decreased pedal pulses bilaterally. No resting leg pain. She is concerned about her high cholesterol and fact that she is not currently on any statin medications. She has adjusted her diet, now eating less salt and high fat foods.   Current Meds  Medication Sig  . amLODipine (NORVASC) 5 MG tablet Take 1 tablet (5 mg total) by mouth daily.  Marland Kitchen aspirin EC 81 MG tablet Take 81 mg by mouth daily.  Marland Kitchen esomeprazole (NEXIUM) 40 MG capsule TAKE 1 CAPSULE (40 MG TOTAL) BY MOUTH DAILY BEFORE BREAKFAST.  Marland Kitchen gabapentin (NEURONTIN) 600 MG tablet TAKE 1 TABLET (600 MG TOTAL) BY MOUTH 3 (THREE) TIMES DAILY.  Marland Kitchen loratadine-pseudoephedrine (CLARITIN-D 12-HOUR) 5-120 MG  tablet Take 1 tablet by mouth daily.  . metFORMIN (GLUCOPHAGE-XR) 500 MG 24 hr tablet TAKE TWO TABLETS BY MOUTH TWICE A DAY  . ONE TOUCH ULTRA TEST test strip USE TO TEST BLOOD SUGAR THREE TIMES A DAY  . promethazine-codeine (PHENERGAN WITH CODEINE) 6.25-10 MG/5ML syrup Take 5 mLs by mouth every 4 (four) hours as needed for cough.  . triamcinolone cream (KENALOG) 0.1 % APPLY 1 APPLICATION TOPICALLY 4 (FOUR) TIMES DAILY AS NEEDED FOR RASH  . UNKNOWN TO PATIENT Inject as directed every 6 (six) months. 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 valsartan-hydrochlorothiazide (DIOVAN-HCT) 160-12.5 MG tablet Take 1 tablet by mouth daily.   Allergies  Allergen Reactions  . Olmesartan Medoxomil     REACTION: headache   Past Medical History:  Diagnosis Date  . Alkaline phosphatase deficiency    w/u Ne  . Allergic rhinitis   . Anxiety   . Arthritis   . DM2 (diabetes mellitus, type 2) ([place])   . GERD (gastroesophageal reflux disease)   . Gout   . Hemorrhoids   . Hyperlipidemia   . Hypertension   . Mild pulmonary hypertension ([place])   . NSVT (nonsustained ventricular tachycardia) ([place])   . Osteoporosis   . PVC's (premature ventricular contractions)   . Thyroid nodule    small  . Uterine fibroid    Family History  Problem Relation Age of Onset  . Heart disease Mother   .  Heart disease Father   . Heart disease Maternal Grandfather   . Rectal cancer Maternal Grandfather   . Stomach cancer Maternal Grandmother   . Colon cancer Neg Hx   . Breast cancer Neg Hx    Past Surgical History:  Procedure Laterality Date  . CATARACT EXTRACTION Bilateral 2016  . COLONOSCOPY  2007  . echocardiogram (other)  01/16/2002  . 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 stress cardiolite  02/12/2006  . TOENAIL EXCISION    . TUBAL LIGATION     Social History   Social History  . Marital status: Widowed    Spouse name: N/A  . Number of children: 2  . Years of education: N/A   Occupational  History  . OTC CLERK Unemployed   Social History Main Topics  . Smoking status: Former Smoker    Packs/day: 0.25    Years: 5.00    Types: Cigarettes    Quit date: 06/29/1968  . Smokeless tobacco: Never Used  . Alcohol use No  . Drug use: No  . Sexual activity: Not on file   Other Topics Concern  . Not on file   Social History Narrative   Widowed 2010.      Review of Systems: General: negative for chills, fever, night sweats or weight changes.  Cardiovascular: negative for chest pain, dyspnea on exertion, edema, orthopnea, palpitations, paroxysmal nocturnal dyspnea or shortness of breath Dermatological: negative for rash Respiratory: negative for cough or wheezing Urologic: negative for hematuria Abdominal: negative for nausea, vomiting, diarrhea, bright red blood per rectum, melena, or hematemesis Neurologic: negative for visual changes, syncope, or dizziness All other systems reviewed and are otherwise negative except as noted above.   Physical Exam:  Blood pressure 108/60, pulse 82, height 5' 4.5" (1.638 m), weight 163 lb 1.9 oz (74 kg).  General appearance: alert, cooperative and no distress Neck: no carotid bruit and no JVD Lungs: clear to auscultation bilaterally Heart: regular rate and rhythm, S1, S2 normal, no murmur, click, rub or gallop Extremities: decreased pedal pulses Pulses: 2+ and symmetric Skin: Skin color, texture, turgor normal. No rashes or lesions Neurologic: Grossly normal  EKG not performed  -- personally reviewed   ASSESSMENT AND PLAN:   1. CAD: recent calcium score abnormal at 220. Coronary CTA showed Moderate plaque in the ostial LM and mild plaque of the ostial RCA. FFR did not show significant stenosis. She is CP free. Continue ASA and control of cardiac risk factors, including HTN, DM and HLD.   2. Claudication: she has noticed bilateral hip pain with ambulation, concerning for claudication. Pain resolves with rest and she has decreased pedal  pulses bilaterally. No resting leg pain. She  has known CAD. We will arrange for bilateral LE arterial dopplers to r/o PVD. If abnormal, we will refer to Dr. Gwenlyn Found or Dr. Fletcher Anon.   3. Chest Pain: ? coronary vasospasm. Pain resolved with increased dose of amlodipine to 5 mg. BP is stable.   4. HTN: controlled on current regimen.   5. HLD: recent lipid panel 12/18/16 showed elevated LDL at 152 mg/dL. Her statin was discontinued due to elevated HFTs. She has CAD, HTN, DM and ? PAD. We will refer her to our lipid clinic for consideration for PCSK-9 inhibitors. We also discussed low fat diet and increasing physical activity.   6. DM: followed by PCP.   Follow-Up w/ Dr. Meda Coffee in 6 months.   Murlin Schrieber Ladoris Gene, MHS [facility] 12/24/2016 10:04 AM

## 2016-12-29 DIAGNOSIS — R3914 Feeling of incomplete bladder emptying: Secondary | ICD-10-CM | POA: Diagnosis not present

## 2016-12-29 DIAGNOSIS — R8271 Bacteriuria: Secondary | ICD-10-CM | POA: Diagnosis not present

## 2017-01-05 ENCOUNTER — Other Ambulatory Visit: Payer: Self-pay | Admitting: Cardiology

## 2017-01-05 DIAGNOSIS — I739 Peripheral vascular disease, unspecified: Secondary | ICD-10-CM

## 2017-01-06 ENCOUNTER — Ambulatory Visit (INDEPENDENT_AMBULATORY_CARE_PROVIDER_SITE_OTHER): Payer: Medicare Other | Admitting: Pharmacist

## 2017-01-06 ENCOUNTER — Encounter: Payer: Self-pay | Admitting: Pharmacist

## 2017-01-06 DIAGNOSIS — E7800 Pure hypercholesterolemia, unspecified: Secondary | ICD-10-CM

## 2017-01-06 MED ORDER — ROSUVASTATIN CALCIUM 5 MG PO TABS: 5.0000 mg | ORAL_TABLET | Freq: Every day | ORAL | 1 refills | Status: DC

## 2017-01-06 NOTE — Patient Instructions (Signed)
We will call to find out statin history from Dr. Loanne Drilling - will plan to start Crestor 5mg  daily with repeat Liver panel in 4 weeks. If cannot start statin will pursue Repatha (injection).   Cholesterol Cholesterol is a fat. Your body needs a small amount of cholesterol. Cholesterol (plaque) may build up in your blood vessels (arteries). That makes you more likely to have a heart attack or stroke. You cannot feel your cholesterol level. Having a blood test is the only way to find out if your level is high. Keep your test results. Work with your doctor to keep your cholesterol at a good level. What do the results mean?  Total cholesterol is how much cholesterol is in your blood.  LDL is bad cholesterol. This is the type that can build up. Try to have low LDL.  HDL is good cholesterol. It cleans your blood vessels and carries LDL away. Try to have high HDL.  Triglycerides are fat that the body can store or burn for energy. What are good levels of cholesterol?  Total cholesterol below 200.  LDL below 100 is good for people who have health risks. LDL below 70 is good for people who have very high risks.  HDL above 40 is good. It is best to have HDL of 60 or higher.  Triglycerides below 150. How can I lower my cholesterol? Diet Follow your diet program as told by your doctor.  Choose fish, white meat chicken, or [place] that is roasted or baked. Try not to eat red meat, fried foods, sausage, or lunch meats.  Eat lots of fresh fruits and vegetables.  Choose whole grains, beans, pasta, potatoes, and cereals.  Choose olive oil, corn oil, or canola oil. Only use small amounts.  Try not to eat butter, mayonnaise, shortening, or palm kernel oils.  Try not to eat foods with trans fats.  Choose low-fat or nonfat dairy foods. ? Drink skim or nonfat milk. ? Eat low-fat or nonfat yogurt and cheeses. ? Try not to drink whole milk or cream. ? Try not to eat ice cream, egg yolks, or full-fat  cheeses.  Healthy desserts include angel food cake, ginger snaps, animal crackers, hard candy, popsicles, and low-fat or nonfat frozen yogurt. Try not to eat pastries, cakes, pies, and cookies.  Exercise Follow your exercise program as told by your doctor.  Be more active. Try gardening, walking, and taking the stairs.  Ask your doctor about ways that you can be more active.  Medicine  Take over-the-counter and prescription medicines only as told by your doctor.  This information is not intended to replace advice given to you by your health care provider. Make sure you discuss any questions you have with your health care provider. Document Released: 09/11/2008 Document Revised: 01/15/2016 Document Reviewed: 12/26/2015 Elsevier Interactive Patient Education  2017 [employer].

## 2017-01-06 NOTE — Progress Notes (Signed)
Patient ID: SPECIAL RANES                 DOB: 1945/08/01                    MRN: [id number]     HPI: Amy Escobar is a 71 y.o. female patient of Dr. Meda Coffee with PMH below that presents today for lipid evaluation. She had a NST in 2016 that was negative for ischemia. She has multiple cardiac risk factors including HTN, HLD with recent LDL at 152 (not on statins given abnormal HFTs) as well as a h/o DM. Given her recent recurrent CP and multitude cardiac risk factors, it was recommended that she be evaluate with a coronary CTA with calcium score to better assess her risk. Her calcium score was elevated at 220. She was noted to have Moderate plaque in the ostial LM and mild plaque of the ostial RCA.  She presents today and states that she is concerned about her cholesterol because it was high. In talking with her she states she had tried Crestor in the past that was stopped for unknown reason.   After digging in chart it appears this was changed to simvastatin due to cost reasons in 2010.   Her LFTs bumped slight recently and she was told to hold statins. Her LFTs have bumped again slightly since despsite her remaining off statin therapy (11/04/16 (statin stopped) >>11/24/16>>12/18/16 - ALT 43>>39>>41; AST 49>>32>>43).   Risk Factors: CAD LDL Goal: <70  Current Medications: none  Intolerances: simvastatin 40 and 80mg  daily, atorvastatin 20 and 40mg  daily and she tried Crestor samples a long time ago - discontinued due to elevated LFTs  Diet: Most meals prepared from home. Has decreased meat. Eats most chicken and fish. She eats a lot of beans and vegetables. She has changed to baking rather than frying. She uses Ms. DASH for seasoning.   Exercise: Has been limited due to pulled muscles in calf and knee pain.   Family History: Mom passed of MI at 61 yo. Father passed young of massive heart attack. Maternal grandfather with heart problems.   Social History: Quit smoking when 38-19 years  old. Chewed tobacco from age 60 until about 71 yo. No alcohol in 36 years.   Labs: 12/18/16: TC 213, TG 124, HDL 36, LDL 152 - no statin therapy   Past Medical History:  Diagnosis Date  . Alkaline phosphatase deficiency    w/u Ne  . Allergic rhinitis   . Anxiety   . Arthritis   . DM2 (diabetes mellitus, type 2) ([place])   . GERD (gastroesophageal reflux disease)   . Gout   . Hemorrhoids   . Hyperlipidemia   . Hypertension   . Mild pulmonary hypertension ([place])   . NSVT (nonsustained ventricular tachycardia) ([place])   . Osteoporosis   . PVC's (premature ventricular contractions)   . Thyroid nodule    small  . Uterine fibroid     Current Outpatient Prescriptions on File Prior to Visit  Medication Sig Dispense Refill  . amLODipine (NORVASC) 5 MG tablet Take 1 tablet (5 mg total) by mouth daily. 90 tablet 2  . aspirin EC 81 MG tablet Take 81 mg by mouth daily.    Marland Kitchen esomeprazole (NEXIUM) 40 MG capsule TAKE 1 CAPSULE (40 MG TOTAL) BY MOUTH DAILY BEFORE BREAKFAST. 90 capsule 7  . gabapentin (NEURONTIN) 600 MG tablet TAKE 1 TABLET (600 MG TOTAL) BY MOUTH 3 (THREE) TIMES DAILY. [place]  tablet 9  . loratadine-pseudoephedrine (CLARITIN-D 12-HOUR) 5-120 MG tablet Take 1 tablet by mouth daily. 30 tablet 11  . metFORMIN (GLUCOPHAGE-XR) 500 MG 24 hr tablet TAKE TWO TABLETS BY MOUTH TWICE A DAY 360 tablet 0  . ONE TOUCH ULTRA TEST test strip USE TO TEST BLOOD SUGAR THREE TIMES A DAY 100 each 3  . promethazine-codeine (PHENERGAN WITH CODEINE) 6.25-10 MG/5ML syrup Take 5 mLs by mouth every 4 (four) hours as needed for cough.    . triamcinolone cream (KENALOG) 0.1 % APPLY 1 APPLICATION TOPICALLY 4 (FOUR) TIMES DAILY AS NEEDED FOR RASH 45 g 1  . UNKNOWN TO PATIENT Inject as directed every 6 (six) months. 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 valsartan-hydrochlorothiazide (DIOVAN-HCT) 160-12.5 MG tablet Take 1 tablet by mouth daily. 90 tablet 3   No current facility-administered medications  on file prior to visit.     Allergies  Allergen Reactions  . Olmesartan Medoxomil     REACTION: headache    Assessment/Plan: Hyperlipidemia: LDL not at goal. Will rechallenge with low dose Crestor 5mg  daily and repeat LFTs in 4 weeks. Discussed PCSK9i therapy if change in LFTs on Crestor. Pt is agreeable to plan.   Thank you,  Amy Escobar, [facility]  01/06/2017 7:27 AM

## 2017-01-08 LAB — HEPATIC FUNCTION PANEL
ALBUMIN: 4.6 g/dL (ref 3.5–4.8)
ALK PHOS: 96 IU/L (ref 39–117)
ALT: 41 IU/L — AB (ref 0–32)
AST: 43 IU/L — AB (ref 0–40)
BILIRUBIN TOTAL: 0.2 mg/dL (ref 0.0–1.2)
BILIRUBIN, DIRECT: 0.08 mg/dL (ref 0.00–0.40)
Total Protein: 7.5 g/dL (ref 6.0–8.5)

## 2017-01-08 LAB — LIPID PANEL
CHOLESTEROL TOTAL: 213 mg/dL — AB (ref 100–199)
Chol/HDL Ratio: 5.9 ratio — ABNORMAL HIGH (ref 0.0–4.4)
HDL: 36 mg/dL — ABNORMAL LOW (ref >39)
LDL CALC: 152 mg/dL — AB (ref 0–99)
TRIGLYCERIDES: 124 mg/dL (ref 0–149)
VLDL Cholesterol Cal: 25 mg/dL (ref 5–40)

## 2017-01-12 ENCOUNTER — Encounter: Payer: Self-pay | Admitting: Family Medicine

## 2017-01-12 ENCOUNTER — Ambulatory Visit (INDEPENDENT_AMBULATORY_CARE_PROVIDER_SITE_OTHER): Payer: Medicare Other | Admitting: Family Medicine

## 2017-01-12 ENCOUNTER — Other Ambulatory Visit: Payer: Self-pay

## 2017-01-12 DIAGNOSIS — N390 Urinary tract infection, site not specified: Secondary | ICD-10-CM | POA: Diagnosis not present

## 2017-01-12 DIAGNOSIS — M170 Bilateral primary osteoarthritis of knee: Secondary | ICD-10-CM

## 2017-01-12 DIAGNOSIS — R3914 Feeling of incomplete bladder emptying: Secondary | ICD-10-CM | POA: Diagnosis not present

## 2017-01-12 MED ORDER — VITAMIN D (ERGOCALCIFEROL) 1.25 MG (50000 UNIT) PO CAPS: 50000.0000 [IU] | ORAL_CAPSULE | ORAL | 0 refills | Status: DC

## 2017-01-12 NOTE — Progress Notes (Signed)
Pre visit review using our clinic review tool, if applicable. No additional management support is needed unless otherwise documented below in the visit note. 

## 2017-01-12 NOTE — Patient Instructions (Signed)
Good to see you  You know the drill  Stay active Ice is your friend See me again in 4-6 weeks and if worsening symptoms consider other injections.

## 2017-01-12 NOTE — Assessment & Plan Note (Signed)
Worsening symptoms.  Discussed HEP

## 2017-01-12 NOTE — Progress Notes (Signed)
[name] [address] [place], [place] [address] Phone: [phone] Subjective:     CC: bilateral knee pain   Amy Escobar is a 71 y.o. female coming in with complaint of bilateral knee pain.Patient states worsening pain. Has not been seen for greater than a year. Has espondedwell to steroid injections previously. Increasing pain, increasing swellng, worsening symptoms overall, patient has not been doing very well with the exercises.       Past Medical History:  Diagnosis Date  . Alkaline phosphatase deficiency    w/u Ne  . Allergic rhinitis   . Anxiety   . Arthritis   . DM2 (diabetes mellitus, type 2) ([place])   . GERD (gastroesophageal reflux disease)   . Gout   . Hemorrhoids   . Hyperlipidemia   . Hypertension   . Mild pulmonary hypertension ([place])   . NSVT (nonsustained ventricular tachycardia) ([place])   . Osteoporosis   . PVC's (premature ventricular contractions)   . Thyroid nodule    small  . Uterine fibroid    Past Surgical History:  Procedure Laterality Date  . CATARACT EXTRACTION Bilateral 2016  . COLONOSCOPY  2007  . echocardiogram (other)  01/16/2002  . 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 stress cardiolite  02/12/2006  . TOENAIL EXCISION    . TUBAL LIGATION     Social History   Social History  . Marital status: Widowed    Spouse name: N/A  . Number of children: 2  . Years of education: N/A   Occupational History  . OTC CLERK Unemployed   Social History Main Topics  . Smoking status: Former Smoker    Packs/day: 0.25    Years: 5.00    Types: Cigarettes    Quit date: 06/29/1968  . Smokeless tobacco: Never Used  . Alcohol use No  . Drug use: No  . Sexual activity: Not on file   Other Topics Concern  . Not on file   Social History Narrative   Widowed 2010.    Allergies  Allergen Reactions  . Olmesartan Medoxomil     REACTION: headache   Family History  Problem Relation Age of  Onset  . Heart disease Mother   . Heart disease Father   . Heart disease Maternal Grandfather   . Rectal cancer Maternal Grandfather   . Stomach cancer Maternal Grandmother   . Colon cancer Neg Hx   . Breast cancer Neg Hx     Past medical history, social, surgical and family history all reviewed in electronic medical record.  No pertanent information unless stated regarding to the chief complaint.   Review of Systems:Review of systems updated and as accurate as of 01/12/17  No headache, visual changes, nausea, vomiting, diarrhea, constipation, dizziness, abdominal pain, skin rash, fevers, chills, night sweats, weight loss, swollen lymph nodes,  chest pain, shortness of breath, mood changes. + muscle aches +body aches.   Objective  Height 5' 4.5" (1.638 m), weight 159 lb (72.1 kg). Systems examined below as of 01/12/17   General: No apparent distress alert and oriented x3 mood and affect normal, dressed appropriately.  HEENT: Pupils equal, extraocular movements intact  Respiratory: Patient's speak in full sentences and does not appear short of breath  Cardiovascular: No lower extremity edema, non tender, no erythema  Skin: Warm dry intact with no signs of infection or rash on extremities or on axial skeleton.  Abdomen: Soft nontender  Neuro: Cranial nerves  II through XII are intact, neurovascularly intact in all extremities with 2+ DTRs and 2+ pulses.  Lymph: No lymphadenopathy of posterior or anterior cervical chain or axillae bilaterally.  Gait normal with good balance and coordination.  MSK:  Non tender with full range of motion and good stability and symmetric strength and tone of shoulders, elbows, wrist, hip, and ankles bilaterally.  Knee:bilateral  valgus deformity noted.  Tender to palpation over medial and PF joint line.  ROM full in flexion and extension and lower leg rotation. instability with valgus force.  painful patellar compression. Patellar glide with moderate  crepitus. Patellar and quadriceps tendons unremarkable. Hamstring and quadriceps strength is normal.  After informed written and verbal consent, patient was seated on exam table. Right knee was prepped with alcohol swab and utilizing anterolateral approach, patient's right knee space was injected with 4:1  marcaine 0.5%: Kenalog 40mg /dL. Patient tolerated the procedure well without immediate complications.  After informed written and verbal consent, patient was seated on exam table. Left knee was prepped with alcohol swab and utilizing anterolateral approach, patient's left knee space was injected with 4:1  marcaine 0.5%: Kenalog 40mg /dL. Patient tolerated the procedure well without immediate complications.   Impression and Recommendations:     This case required medical decision making of moderate complexity.      Note: This dictation was prepared with Dragon dictation along with smaller phrase technology. Any transcriptional errors that result from this process are unintentional.

## 2017-01-19 ENCOUNTER — Other Ambulatory Visit: Payer: Self-pay | Admitting: Endocrinology

## 2017-01-19 ENCOUNTER — Ambulatory Visit (HOSPITAL_COMMUNITY)
Admission: RE | Admit: 2017-01-19 | Discharge: 2017-01-19 | Disposition: A | Discharge status: Home / Self Care | Payer: Medicare Other | Source: Ambulatory Visit | Attending: Internal Medicine | Admitting: Internal Medicine

## 2017-01-19 DIAGNOSIS — I739 Peripheral vascular disease, unspecified: Secondary | ICD-10-CM | POA: Diagnosis not present

## 2017-01-29 ENCOUNTER — Other Ambulatory Visit: Payer: Self-pay | Admitting: Endocrinology

## 2017-02-03 ENCOUNTER — Other Ambulatory Visit: Payer: Medicare Other

## 2017-02-03 ENCOUNTER — Telehealth: Payer: Self-pay | Admitting: Endocrinology

## 2017-02-03 DIAGNOSIS — E7800 Pure hypercholesterolemia, unspecified: Secondary | ICD-10-CM

## 2017-02-03 LAB — HEPATIC FUNCTION PANEL
ALT: 36 IU/L — ABNORMAL HIGH (ref 0–32)
AST: 25 IU/L (ref 0–40)
Albumin: 4.3 g/dL (ref 3.5–4.8)
Alkaline Phosphatase: 105 IU/L (ref 39–117)
Bilirubin Total: 0.3 mg/dL (ref 0.0–1.2)
Bilirubin, Direct: 0.11 mg/dL (ref 0.00–0.40)
Total Protein: 7.2 g/dL (ref 6.0–8.5)

## 2017-02-03 NOTE — Telephone Encounter (Signed)
[facility] correct fax [phone]. Faxed one touch ultra test strip script.

## 2017-02-09 ENCOUNTER — Ambulatory Visit (INDEPENDENT_AMBULATORY_CARE_PROVIDER_SITE_OTHER): Payer: Medicare Other | Admitting: Endocrinology

## 2017-02-09 ENCOUNTER — Encounter: Payer: Self-pay | Admitting: Endocrinology

## 2017-02-09 VITALS — BP 122/70 | HR 70 | Wt 153.6 lb

## 2017-02-09 DIAGNOSIS — E118 Type 2 diabetes mellitus with unspecified complications: Secondary | ICD-10-CM

## 2017-02-09 LAB — POCT GLYCOSYLATED HEMOGLOBIN (HGB A1C): Hemoglobin A1C: 5.5

## 2017-02-09 MED ORDER — GLUCOSE BLOOD VI STRP: 1.0000 | ORAL_STRIP | Freq: Every day | 2 refills | Status: DC

## 2017-02-09 NOTE — Patient Instructions (Signed)
Please continue the same medications Please come back for a regular physical appointment in 6 months (must be after 08/12/17).

## 2017-02-09 NOTE — Progress Notes (Signed)
Subjective:    Patient ID: Amy Escobar, female    DOB: [birth year], 71 y.o.   MRN: [id number]  HPI Pt returns for f/u of diabetes mellitus: DM type: 2 Dx'ed: 3536 Complications: CAD Therapy: metformin GDM: never.   DKA: never.   Severe hypoglycemia: never.   Pancreatitis: never.  Other: she has never been on insulin.   Interval history: pt states she feels well in general.  She takes metformin as rx'ed.  she says her diet is much better recently Past Medical History:  Diagnosis Date  . Alkaline phosphatase deficiency    w/u Ne  . Allergic rhinitis   . Anxiety   . Arthritis   . DM2 (diabetes mellitus, type 2) ([place])   . GERD (gastroesophageal reflux disease)   . Gout   . Hemorrhoids   . Hyperlipidemia   . Hypertension   . Mild pulmonary hypertension ([place])   . NSVT (nonsustained ventricular tachycardia) ([place])   . Osteoporosis   . PVC's (premature ventricular contractions)   . Thyroid nodule    small  . Uterine fibroid     Past Surgical History:  Procedure Laterality Date  . CATARACT EXTRACTION Bilateral 2016  . COLONOSCOPY  2007  . echocardiogram (other)  01/16/2002  . 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 stress cardiolite  02/12/2006  . TOENAIL EXCISION    . TUBAL LIGATION      Social History   Social History  . Marital status: Widowed    Spouse name: N/A  . Number of children: 2  . Years of education: N/A   Occupational History  . OTC CLERK Unemployed   Social History Main Topics  . Smoking status: Former Smoker    Packs/day: 0.25    Years: 5.00    Types: Cigarettes    Quit date: 06/29/1968  . Smokeless tobacco: Never Used  . Alcohol use No  . Drug use: No  . Sexual activity: Not on file   Other Topics Concern  . Not on file   Social History Narrative   Widowed 2010.     Current Outpatient Prescriptions on File Prior to Visit  Medication Sig Dispense Refill  . amLODipine (NORVASC) 5 MG tablet Take 1 tablet (5 mg total) by mouth  daily. 90 tablet 2  . aspirin EC 81 MG tablet Take 81 mg by mouth daily.    Marland Kitchen esomeprazole (NEXIUM) 40 MG capsule TAKE 1 CAPSULE (40 MG TOTAL) BY MOUTH DAILY BEFORE BREAKFAST. 90 capsule 7  . gabapentin (NEURONTIN) 600 MG tablet TAKE 1 TABLET (600 MG TOTAL) BY MOUTH 3 (THREE) TIMES DAILY. 270 tablet 9  . loratadine-pseudoephedrine (CLARITIN-D 12-HOUR) 5-120 MG tablet Take 1 tablet by mouth daily. 30 tablet 11  . metFORMIN (GLUCOPHAGE-XR) 500 MG 24 hr tablet TAKE TWO TABLETS BY MOUTH TWICE A DAY 360 tablet 0  . rosuvastatin (CRESTOR) 5 MG tablet Take 1 tablet (5 mg total) by mouth daily. 30 tablet 1  . triamcinolone cream (KENALOG) 0.1 % APPLY 1 APPLICATION TOPICALLY 4 (FOUR) TIMES DAILY AS NEEDED FOR RASH 45 g 1  . UNKNOWN TO PATIENT Inject as directed every 6 (six) months. 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 valsartan-hydrochlorothiazide (DIOVAN-HCT) 160-12.5 MG tablet Take 1 tablet by mouth daily. 90 tablet 3  . Vitamin D, Ergocalciferol, (DRISDOL) 50000 units CAPS capsule Take 1 capsule (50,000 Units total) by mouth every 7 (seven) days. 12 capsule 0   No current facility-administered  medications on file prior to visit.     Allergies  Allergen Reactions  . Olmesartan Medoxomil     REACTION: headache    Family History  Problem Relation Age of Onset  . Heart disease Mother   . Heart disease Father   . Heart disease Maternal Grandfather   . Rectal cancer Maternal Grandfather   . Stomach cancer Maternal Grandmother   . Colon cancer Neg Hx   . Breast cancer Neg Hx     BP 122/70   Pulse 70   Wt 153 lb 9.6 oz (69.7 kg)   SpO2 99%   BMI 25.96 kg/m    Review of Systems Denies chest pain.      Objective:   Physical Exam VITAL SIGNS:  See vs page GENERAL: no distress Pulses: foot pulses are intact bilaterally.   MSK: no deformity of the feet or ankles.  CV: no edema of the legs or ankles Skin:  no ulcer on the feet or ankles.  normal color and temp on the  feet and ankles Neuro: sensation is intact to touch on the feet and ankles.    A1c=5.5%    Assessment & Plan:  Type 2 DM: well-controlled CAD: new to me.  I advised pt to continue a good diet. HTN: well-controlled.  Please continue the same medication  Patient Instructions  Please continue the same medications Please come back for a regular physical appointment in 6 months (must be after 08/12/17).

## 2017-02-15 ENCOUNTER — Other Ambulatory Visit: Payer: Self-pay | Admitting: Endocrinology

## 2017-02-16 ENCOUNTER — Other Ambulatory Visit: Payer: Self-pay

## 2017-02-16 ENCOUNTER — Other Ambulatory Visit: Payer: Self-pay | Admitting: Endocrinology

## 2017-02-16 MED ORDER — METFORMIN HCL ER 500 MG PO TB24: 1000.0000 mg | ORAL_TABLET | Freq: Two times a day (BID) | ORAL | 0 refills | Status: DC

## 2017-02-17 ENCOUNTER — Other Ambulatory Visit: Payer: Self-pay | Admitting: Pharmacist

## 2017-02-17 ENCOUNTER — Telehealth: Payer: Self-pay | Admitting: Pharmacist

## 2017-02-17 ENCOUNTER — Other Ambulatory Visit: Payer: Self-pay

## 2017-02-17 DIAGNOSIS — E7800 Pure hypercholesterolemia, unspecified: Secondary | ICD-10-CM

## 2017-02-17 MED ORDER — METFORMIN HCL ER 500 MG PO TB24: 1000.0000 mg | ORAL_TABLET | Freq: Two times a day (BID) | ORAL | 0 refills | Status: DC

## 2017-02-17 NOTE — Progress Notes (Addendum)
B12 and BMET ok per Dr. Meda Coffee. Dr. Meda Coffee requested CPK as well. All orders entered.

## 2017-02-17 NOTE — Telephone Encounter (Signed)
Pt called back and asked about a health food - Nattokinase and if this would help her cholesterol. She states that a health foods doctor recommended it to her, but she wants to be sure that we would be ok with her taking product.   Advised that I am not familiar with that particular product, but that I would do some research and see what information I could find for safety and efficacy and call her back. She states appreciation and that she will not start product until she hears from [place].

## 2017-02-18 DIAGNOSIS — R350 Frequency of micturition: Secondary | ICD-10-CM | POA: Diagnosis not present

## 2017-02-18 DIAGNOSIS — R3914 Feeling of incomplete bladder emptying: Secondary | ICD-10-CM | POA: Diagnosis not present

## 2017-02-18 NOTE — Telephone Encounter (Addendum)
Spoke with patient and advised that based on evidence that I could find with nattokinase (a fermented soy product), it is thought nattokinase carries antiplatelet properties (for which she already takes ASA) and that I could not find any lipid lowering evidence. The product is reported as generally safe (with few adverse effects reported). However, advised against use of product based on lack of evidence. Patient stated she would not take product due to this and the cost of the product. She stated she would call back if she found any other products she wished to try. She thanked me for information.

## 2017-03-04 ENCOUNTER — Other Ambulatory Visit: Payer: Self-pay | Admitting: Cardiology

## 2017-03-09 ENCOUNTER — Other Ambulatory Visit: Payer: Self-pay | Admitting: Cardiology

## 2017-03-10 MED ORDER — ROSUVASTATIN CALCIUM 5 MG PO TABS: ORAL_TABLET | ORAL | 3 refills | Status: DC

## 2017-03-10 NOTE — Addendum Note (Signed)
Addended by: Hosie Poisson R on: 03/10/2017 03:32 PM   Modules accepted: Orders

## 2017-03-10 NOTE — Telephone Encounter (Signed)
Please keep upcoming appointment for further refills

## 2017-03-15 ENCOUNTER — Other Ambulatory Visit: Payer: Self-pay

## 2017-03-15 MED ORDER — ROSUVASTATIN CALCIUM 5 MG PO TABS: 5.0000 mg | ORAL_TABLET | Freq: Every day | ORAL | 2 refills | Status: DC

## 2017-03-16 MED ORDER — ROSUVASTATIN CALCIUM 5 MG PO TABS: 5.0000 mg | ORAL_TABLET | Freq: Every day | ORAL | 2 refills | Status: DC

## 2017-03-16 NOTE — Addendum Note (Signed)
Addended by: Derl Barrow on: 03/16/2017 10:17 AM   Modules accepted: Orders

## 2017-03-17 ENCOUNTER — Other Ambulatory Visit: Payer: Self-pay | Admitting: Endocrinology

## 2017-03-17 ENCOUNTER — Ambulatory Visit (INDEPENDENT_AMBULATORY_CARE_PROVIDER_SITE_OTHER): Payer: Medicare Other | Admitting: Neurology

## 2017-03-17 ENCOUNTER — Encounter: Payer: Self-pay | Admitting: Neurology

## 2017-03-17 VITALS — BP 90/54 | HR 88 | Ht 64.5 in | Wt 149.2 lb

## 2017-03-17 DIAGNOSIS — R413 Other amnesia: Secondary | ICD-10-CM | POA: Diagnosis not present

## 2017-03-17 NOTE — Patient Instructions (Signed)
Your memory and other cognitive function is normal.  I don't suspect any underlying dementia such as Alzheimer's Follow up as needed

## 2017-03-17 NOTE — Progress Notes (Signed)
NEUROLOGY FOLLOW UP OFFICE NOTE  SHEELAH RITACCO [phone]  HISTORY OF PRESENT ILLNESS: Amy Escobar is a 71 year old woman with type 2 diabetes, HTN, HLD, back pain and cervicogenic headache treated with tizanidine follows up for memory loss.  UPDATE:  B12 level from December was 446.  Repeat TSH from February was 2.27. There has been no change in regards to memory.  She is completely independent.  She found out she had coronary artery disease.  Since then, she has worked on her diet and has lost 20 lbs.  She needs to increase exercise however.  She reports fatigue.  HISTORY: For a couple of years, she has noticed short-term memory deficits.  Sometimes, she forgets why she walked into a room.  She needs to use a calendar to keep organized.  However, she does not get lost driving on familiar routes, she does not have trouble remembering close friends and family, she does not frequently have trouble remembering to take medication.  She denies family history of dementia.   TSH from 08/09/15 was 1.38.  PAST MEDICAL HISTORY: Past Medical History:  Diagnosis Date  . Alkaline phosphatase deficiency    w/u Ne  . Allergic rhinitis   . Anxiety   . Arthritis   . DM2 (diabetes mellitus, type 2) ([place])   . GERD (gastroesophageal reflux disease)   . Gout   . Hemorrhoids   . Hyperlipidemia   . Hypertension   . Mild pulmonary hypertension ([place])   . NSVT (nonsustained ventricular tachycardia) ([place])   . Osteoporosis   . PVC's (premature ventricular contractions)   . Thyroid nodule    small  . Uterine fibroid     MEDICATIONS: Current Outpatient Prescriptions on File Prior to Visit  Medication Sig Dispense Refill  . amLODipine (NORVASC) 5 MG tablet Take 1 tablet (5 mg total) by mouth daily. 90 tablet 2  . aspirin EC 81 MG tablet Take 81 mg by mouth daily.    Marland Kitchen esomeprazole (NEXIUM) 40 MG capsule TAKE 1 CAPSULE (40 MG TOTAL) BY MOUTH DAILY BEFORE BREAKFAST. 90 capsule 7  .  gabapentin (NEURONTIN) 600 MG tablet TAKE 1 TABLET (600 MG TOTAL) BY MOUTH 3 (THREE) TIMES DAILY. 270 tablet 9  . glucose blood (ONE TOUCH ULTRA TEST) test strip 1 each by Other route daily. And lancets 1/day 100 each 2  . loratadine-pseudoephedrine (CLARITIN-D 12-HOUR) 5-120 MG tablet Take 1 tablet by mouth daily. 30 tablet 11  . metFORMIN (GLUCOPHAGE-XR) 500 MG 24 hr tablet Take 2 tablets (1,000 mg total) by mouth 2 (two) times daily. 360 tablet 0  . rosuvastatin (CRESTOR) 5 MG tablet Take 1 tablet (5 mg total) by mouth daily. 30 tablet 2  . triamcinolone cream (KENALOG) 0.1 % APPLY 1 APPLICATION TOPICALLY 4 (FOUR) TIMES DAILY AS NEEDED FOR RASH 45 g 1  . UNKNOWN TO PATIENT Inject as directed every 6 (six) months. 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 valsartan-hydrochlorothiazide (DIOVAN-HCT) 160-12.5 MG tablet Take 1 tablet by mouth daily. 90 tablet 3  . Vitamin D, Ergocalciferol, (DRISDOL) 50000 units CAPS capsule Take 1 capsule (50,000 Units total) by mouth every 7 (seven) days. 12 capsule 0   No current facility-administered medications on file prior to visit.     ALLERGIES: Allergies  Allergen Reactions  . Olmesartan Medoxomil     REACTION: headache    FAMILY HISTORY: Family History  Problem Relation Age of Onset  . Heart disease Mother   .  Heart disease Father   . Heart disease Maternal Grandfather   . Rectal cancer Maternal Grandfather   . Stomach cancer Maternal Grandmother   . Colon cancer Neg Hx   . Breast cancer Neg Hx     SOCIAL HISTORY: Social History   Social History  . Marital status: Widowed    Spouse name: N/A  . Number of children: 2  . Years of education: N/A   Occupational History  . OTC CLERK Unemployed   Social History Main Topics  . Smoking status: Former Smoker    Packs/day: 0.25    Years: 5.00    Types: Cigarettes    Quit date: 06/29/1968  . Smokeless tobacco: Never Used  . Alcohol use No  . Drug use: No  . Sexual activity:  Not on file   Other Topics Concern  . Not on file   Social History Narrative   Widowed 2010.     REVIEW OF SYSTEMS: Constitutional: No fevers, chills, or sweats, no generalized fatigue, change in appetite Eyes: No visual changes, double vision, eye pain Ear, nose and throat: No hearing loss, ear pain, nasal congestion, sore throat Cardiovascular: No chest pain, palpitations Respiratory:  No shortness of breath at rest or with exertion, wheezes GastrointestinaI: No nausea, vomiting, diarrhea, abdominal pain, fecal incontinence Genitourinary:  No dysuria, urinary retention or frequency Musculoskeletal:  No neck pain, back pain Integumentary: No rash, pruritus, skin lesions Neurological: as above Psychiatric: No depression, insomnia, anxiety Endocrine: No palpitations, fatigue, diaphoresis, mood swings, change in appetite, change in weight, increased thirst Hematologic/Lymphatic:  No purpura, petechiae. Allergic/Immunologic: no itchy/runny eyes, nasal congestion, recent allergic reactions, rashes  PHYSICAL EXAM: Vitals:   03/17/17 0946  BP: (!) 90/54  Pulse: 88  SpO2: 95%   General: No acute distress.  Patient appears well-groomed.  normal body habitus. Head:  Normocephalic/atraumatic Eyes:  Fundi examined but not visualized Neck: supple, no paraspinal tenderness, full range of motion Heart:  Regular rate and rhythm Lungs:  Clear to auscultation bilaterally Back: No paraspinal tenderness Neurological Exam: alert and oriented to person, place, and time. Attention span and concentration intact, recent and remote memory intact, fund of knowledge intact.  Speech fluent and not dysarthric, language intact.  CN II-XII intact. Bulk and tone normal, muscle strength 5/5 throughout.  Sensation to light touch, temperature and vibration intact.  Deep tendon reflexes 2+ throughout, toes downgoing.  Finger to nose and heel to shin testing intact.  Gait normal, Romberg  negative.  IMPRESSION: Memory deficits. Likely normal age-related changes.  No evidence of cognitive impairment on testing  PLAN: Follow up as needed.  15 minutes spent face to face with patient, over 50% spent discussing diagnosis.  Metta Clines, DO  CC:  Renato Shin, MD

## 2017-04-03 ENCOUNTER — Other Ambulatory Visit: Payer: Self-pay | Admitting: Endocrinology

## 2017-04-07 ENCOUNTER — Other Ambulatory Visit: Payer: Medicare Other | Admitting: *Deleted

## 2017-04-07 DIAGNOSIS — R0789 Other chest pain: Secondary | ICD-10-CM

## 2017-04-07 DIAGNOSIS — E785 Hyperlipidemia, unspecified: Secondary | ICD-10-CM

## 2017-04-07 DIAGNOSIS — E7800 Pure hypercholesterolemia, unspecified: Secondary | ICD-10-CM | POA: Diagnosis not present

## 2017-04-07 LAB — BASIC METABOLIC PANEL
BUN/Creatinine Ratio: 16 (ref 12–28)
BUN: 14 mg/dL (ref 8–27)
CO2: 23 mmol/L (ref 20–29)
Calcium: 9.7 mg/dL (ref 8.7–10.3)
Chloride: 100 mmol/L (ref 96–106)
Creatinine, Ser: 0.9 mg/dL (ref 0.57–1.00)
GFR calc Af Amer: 74 mL/min/{1.73_m2} (ref >59)
GFR calc non Af Amer: 65 mL/min/{1.73_m2} (ref >59)
Glucose: 99 mg/dL (ref 65–99)
Potassium: 3.9 mmol/L (ref 3.5–5.2)
Sodium: 139 mmol/L (ref 134–144)

## 2017-04-07 LAB — HEPATIC FUNCTION PANEL
ALT: 24 IU/L (ref 0–32)
AST: 30 IU/L (ref 0–40)
Albumin: 4.2 g/dL (ref 3.5–4.8)
Alkaline Phosphatase: 97 IU/L (ref 39–117)
Bilirubin Total: 0.4 mg/dL (ref 0.0–1.2)
Bilirubin, Direct: 0.13 mg/dL (ref 0.00–0.40)
Total Protein: 7.5 g/dL (ref 6.0–8.5)

## 2017-04-07 LAB — LIPID PANEL
Chol/HDL Ratio: 3.7 ratio (ref 0.0–4.4)
Cholesterol, Total: 144 mg/dL (ref 100–199)
HDL: 39 mg/dL — ABNORMAL LOW (ref >39)
LDL Calculated: 88 mg/dL (ref 0–99)
Triglycerides: 84 mg/dL (ref 0–149)
VLDL Cholesterol Cal: 17 mg/dL (ref 5–40)

## 2017-04-07 LAB — VITAMIN B12: Vitamin B-12: 691 pg/mL (ref 232–1245)

## 2017-04-07 LAB — CK: Total CK: 67 U/L (ref 24–173)

## 2017-04-07 NOTE — Progress Notes (Signed)
Lipid

## 2017-04-10 ENCOUNTER — Other Ambulatory Visit: Payer: Self-pay | Admitting: Family Medicine

## 2017-04-12 NOTE — Telephone Encounter (Signed)
Refill denied. Pt has not been seen in over a year.  

## 2017-04-14 ENCOUNTER — Other Ambulatory Visit: Payer: Self-pay | Admitting: Family Medicine

## 2017-05-11 ENCOUNTER — Encounter: Payer: Self-pay | Admitting: Endocrinology

## 2017-05-11 ENCOUNTER — Ambulatory Visit (INDEPENDENT_AMBULATORY_CARE_PROVIDER_SITE_OTHER): Payer: Medicare Other | Admitting: Endocrinology

## 2017-05-11 VITALS — BP 110/62 | HR 96 | Wt 151.4 lb

## 2017-05-11 DIAGNOSIS — E118 Type 2 diabetes mellitus with unspecified complications: Secondary | ICD-10-CM

## 2017-05-11 LAB — POCT GLYCOSYLATED HEMOGLOBIN (HGB A1C): Hemoglobin A1C: 5.2

## 2017-05-11 MED ORDER — CEFUROXIME AXETIL 250 MG PO TABS: 250.0000 mg | ORAL_TABLET | Freq: Two times a day (BID) | ORAL | 0 refills | Status: DC

## 2017-05-11 MED ORDER — METFORMIN HCL ER 500 MG PO TB24: 1000.0000 mg | ORAL_TABLET | Freq: Every day | ORAL | 3 refills | Status: DC

## 2017-05-11 NOTE — Progress Notes (Signed)
Subjective:    Patient ID: Amy Escobar, female    DOB: 02/20/1946, 71 y.o.   MRN: [id number]  HPI Pt returns for f/u of diabetes mellitus: DM type: 2 Dx'ed: 6387 Complications: CAD Therapy: metformin GDM: never.   DKA: never.   Severe hypoglycemia: never.   Pancreatitis: never.  Other: she has never been on insulin.   Interval history: pt states few days of slightly prod-quality cough in the chest, and assoc pain.  Past Medical History:  Diagnosis Date  . Alkaline phosphatase deficiency    w/u Ne  . Allergic rhinitis   . Anxiety   . Arthritis   . DM2 (diabetes mellitus, type 2) ([place])   . GERD (gastroesophageal reflux disease)   . Gout   . Hemorrhoids   . Hyperlipidemia   . Hypertension   . Mild pulmonary hypertension ([place])   . NSVT (nonsustained ventricular tachycardia) ([place])   . Osteoporosis   . PVC's (premature ventricular contractions)   . Thyroid nodule    small  . Uterine fibroid     Past Surgical History:  Procedure Laterality Date  . CATARACT EXTRACTION Bilateral 2016  . COLONOSCOPY  2007  . echocardiogram (other)  01/16/2002  . 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 stress cardiolite  02/12/2006  . TOENAIL EXCISION    . TUBAL LIGATION      Social History   Socioeconomic History  . Marital status: Widowed    Spouse name: Not on file  . Number of children: 2  . Years of education: Not on file  . Highest education level: Not on file  Social Needs  . Financial resource strain: Not on file  . Food insecurity - worry: Not on file  . Food insecurity - inability: Not on file  . Transportation needs - medical: Not on file  . Transportation needs - non-medical: Not on file  Occupational History  . Occupation: Surveyor, minerals: UNEMPLOYED  Tobacco Use  . Smoking status: Former Smoker    Packs/day: 0.25    Years: 5.00    Pack years: 1.25    Types: Cigarettes    Last attempt to quit: 06/29/1968    Years since quitting: 48.9  .  Smokeless tobacco: Never Used  Substance and Sexual Activity  . Alcohol use: No    Alcohol/week: 0.0 oz  . Drug use: No  . Sexual activity: Not on file  Other Topics Concern  . Not on file  Social History Narrative   Widowed 2010.     Current Outpatient Medications on File Prior to Visit  Medication Sig Dispense Refill  . amLODipine (NORVASC) 5 MG tablet Take 1 tablet (5 mg total) by mouth daily. 90 tablet 2  . aspirin EC 81 MG tablet Take 81 mg by mouth daily.    Marland Kitchen esomeprazole (NEXIUM) 40 MG capsule TAKE 1 CAPSULE (40 MG TOTAL) BY MOUTH DAILY BEFORE BREAKFAST. 90 capsule 7  . fesoterodine (TOVIAZ) 4 MG TB24 tablet Take 4 mg daily by mouth.    . gabapentin (NEURONTIN) 600 MG tablet TAKE 1 TABLET (600 MG TOTAL) BY MOUTH 3 (THREE) TIMES DAILY. 270 tablet 9  . glucose blood (ONE TOUCH ULTRA TEST) test strip 1 each by Other route daily. And lancets 1/day 100 each 2  . loratadine-pseudoephedrine (CLARITIN-D 12-HOUR) 5-120 MG tablet Take 1 tablet by mouth daily. 30 tablet 11  . rosuvastatin (CRESTOR) 5 MG tablet Take 1 tablet (5 mg total) by  mouth daily. 30 tablet 2  . triamcinolone cream (KENALOG) 0.1 % APPLY 1 APPLICATION TOPICALLY 4 (FOUR) TIMES DAILY AS NEEDED FOR RASH 45 g 1  . UNKNOWN TO PATIENT Inject as directed every 6 (six) months. 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 valsartan-hydrochlorothiazide (DIOVAN-HCT) 160-12.5 MG tablet TAKE ONE TABLET BY MOUTH DAILY 90 tablet 2  . Vitamin D, Ergocalciferol, (DRISDOL) 50000 units CAPS capsule TAKE ONE CAPSULE BY MOUTH EVERY 7 DAYS 12 capsule 0   No current facility-administered medications on file prior to visit.     Allergies  Allergen Reactions  . Olmesartan Medoxomil     REACTION: headache    Family History  Problem Relation Age of Onset  . Heart disease Mother   . Heart disease Father   . Heart disease Maternal Grandfather   . Rectal cancer Maternal Grandfather   . Stomach cancer Maternal Grandmother   .  Colon cancer Neg Hx   . Breast cancer Neg Hx     BP 110/62 (BP Location: Left Arm, Patient Position: Sitting, Cuff Size: Normal)   Pulse 96   Wt 151 lb 6.4 oz (68.7 kg)   SpO2 96%   BMI 25.59 kg/m    Review of Systems Denies sore throat, wheezing, nasal congestion, and fever.      Objective:   Physical Exam VITAL SIGNS:  See vs page GENERAL: no distress head: no deformity  eyes: no periorbital swelling, no proptosis  external nose and ears are normal  mouth: no lesion seen Both eac's tm's are red LUNGS:  Clear to auscultation Pulses: foot pulses are intact bilaterally.   MSK: no deformity of the feet or ankles.  CV: no edema of the legs or ankles Skin:  no ulcer on the feet or ankles.  normal color and temp on the feet and ankles Neuro: sensation is intact to touch on the feet and ankles.  Ext: both great toenails are absent.     Lab Results  Component Value Date   HGBA1C 5.2 05/11/2017      Assessment & Plan:  URI, vs allergic rhinitis, new.  Type 2 DM: well-controlled.    Patient Instructions  Please continue the same claritin-D.   I have sent a prescription to your pharmacy, for an antibiotic for your ears.  It is ok to take your cough syrup from your previous supply.   I hope you feel better soon.  If you don't feel better by next week, please call back.  Please call sooner if you get worse.  Please reduce the metformin to 2 pills each morning Please come back for a regular physical appointment (must be after 08/12/17).

## 2017-05-11 NOTE — Patient Instructions (Addendum)
Please continue the same claritin-D.   I have sent a prescription to your pharmacy, for an antibiotic for your ears.  It is ok to take your cough syrup from your previous supply.   I hope you feel better soon.  If you don't feel better by next week, please call back.  Please call sooner if you get worse.  Please reduce the metformin to 2 pills each morning Please come back for a regular physical appointment (must be after 08/12/17).

## 2017-05-14 ENCOUNTER — Other Ambulatory Visit: Payer: Self-pay | Admitting: Endocrinology

## 2017-05-14 ENCOUNTER — Telehealth: Payer: Self-pay | Admitting: Endocrinology

## 2017-05-14 MED ORDER — PROMETHAZINE-DM 6.25-15 MG/5ML PO SYRP: 2.5000 mL | ORAL_SOLUTION | Freq: Four times a day (QID) | ORAL | 1 refills | Status: DC | PRN

## 2017-05-14 NOTE — Telephone Encounter (Signed)
Called & notified patient that prescription was sent.

## 2017-05-14 NOTE — Telephone Encounter (Signed)
Please verify no fever or sob I printed refill

## 2017-05-14 NOTE — Telephone Encounter (Signed)
Patient is requesting a refill for cough syrup (Promethazin-codiene 6.25-10 mg/78mlsy) sent into [facility] [address]

## 2017-05-15 LAB — POCT LIPID PANEL
HDL: 53
TC: 165
TRG: 284

## 2017-05-15 LAB — GLUCOSE, POCT (MANUAL RESULT ENTRY): POC GLUCOSE: 112 mg/dL — AB (ref 70–99)

## 2017-05-23 ENCOUNTER — Other Ambulatory Visit: Payer: Self-pay | Admitting: Endocrinology

## 2017-06-15 ENCOUNTER — Ambulatory Visit: Payer: Medicare Other | Admitting: Family Medicine

## 2017-06-15 ENCOUNTER — Encounter: Payer: Self-pay | Admitting: Family Medicine

## 2017-06-15 DIAGNOSIS — M170 Bilateral primary osteoarthritis of knee: Secondary | ICD-10-CM | POA: Diagnosis not present

## 2017-06-15 NOTE — Assessment & Plan Note (Signed)
Patient has done relatively well with the injections.  Patient if any worsening pain would be a candidate for Visco supplementation.  Discussed the topical anti-inflammatories again in the home exercises.  Encouraged her to do this on a more regular basis.  Continue to once weekly vitamin D.  Follow-up with me again in 4 weeks

## 2017-06-15 NOTE — Patient Instructions (Signed)
Good to see you  We injected thew knees today  Ice 20 minutes 2 times daily. Usually after activity and before bed. Stay active Arnica lotion 2 times a day  See me again in 4 weeks and if needed we will do the other injections.

## 2017-06-15 NOTE — Progress Notes (Signed)
[facility] [place] [place], [place] [address] Phone: [phone] Subjective:    I'm seeing this patient by the request  of:    CC: knee pain   [id number]  Amy Escobar is a 71 y.o. female coming in with complaint of knee pain bilateral.  Has known degenerative arthritis.  Mild to moderate in nature.  Patient has been doing relatively well.  Has been quite some time since patient.  Started to have worsening symptoms.  He did have a fall on her left knee and has noticed some swelling.  Instability O.  States that is just worsening of previous symptoms.     Past Medical History:  Diagnosis Date  . Alkaline phosphatase deficiency    w/u Ne  . Allergic rhinitis   . Anxiety   . Arthritis   . DM2 (diabetes mellitus, type 2) ([place])   . GERD (gastroesophageal reflux disease)   . Gout   . Hemorrhoids   . Hyperlipidemia   . Hypertension   . Mild pulmonary hypertension ([place])   . NSVT (nonsustained ventricular tachycardia) ([place])   . Osteoporosis   . PVC's (premature ventricular contractions)   . Thyroid nodule    small  . Uterine fibroid    Past Surgical History:  Procedure Laterality Date  . CATARACT EXTRACTION Bilateral 2016  . COLONOSCOPY  2007  . echocardiogram (other)  01/16/2002  . 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 stress cardiolite  02/12/2006  . TOENAIL EXCISION    . TUBAL LIGATION     Social History   Socioeconomic History  . Marital status: Widowed    Spouse name: Not on file  . Number of children: 2  . Years of education: Not on file  . Highest education level: Not on file  Social Needs  . Financial resource strain: Not on file  . Food insecurity - worry: Not on file  . Food insecurity - inability: Not on file  . Transportation needs - medical: Not on file  . Transportation needs - non-medical: Not on file  Occupational History  . Occupation: Surveyor, minerals: UNEMPLOYED  Tobacco Use  . Smoking  status: Former Smoker    Packs/day: 0.25    Years: 5.00    Pack years: 1.25    Types: Cigarettes    Last attempt to quit: 06/29/1968    Years since quitting: 48.9  . Smokeless tobacco: Never Used  Substance and Sexual Activity  . Alcohol use: No    Alcohol/week: 0.0 oz  . Drug use: No  . Sexual activity: Not on file  Other Topics Concern  . Not on file  Social History Narrative   Widowed 2010.    Allergies  Allergen Reactions  . Olmesartan Medoxomil     REACTION: headache   Family History  Problem Relation Age of Onset  . Heart disease Mother   . Heart disease Father   . Heart disease Maternal Grandfather   . Rectal cancer Maternal Grandfather   . Stomach cancer Maternal Grandmother   . Colon cancer Neg Hx   . Breast cancer Neg Hx      Past medical history, social, surgical and family history all reviewed in electronic medical record.  No pertanent information unless stated regarding to the chief complaint.   Review of Systems:Review of systems updated and as accurate as of 06/15/17  No headache, visual changes, nausea, vomiting, diarrhea, constipation, dizziness, abdominal pain,  skin rash, fevers, chills, night sweats, weight loss, swollen lymph nodes,chest pain, shortness of breath, mood changes.  Muscle aches and joint swelling  Objective  There were no vitals taken for this visit. Systems examined below as of 06/15/17   General: No apparent distress alert and oriented x3 mood and affect normal, dressed appropriately.  HEENT: Pupils equal, extraocular movements intact  Respiratory: Patient's speak in full sentences and does not appear short of breath  Cardiovascular: No lower extremity edema, non tender, no erythema  Skin: Warm dry intact with no signs of infection or rash on extremities or on axial skeleton.  Abdomen: Soft nontender  Neuro: Cranial nerves II through XII are intact, neurovascularly intact in all extremities with 2+ DTRs and 2+ pulses.  Lymph: No  lymphadenopathy of posterior or anterior cervical chain or axillae bilaterally.  Gait normal with good balance and coordination.  MSK:  Non tender with full range of motion and good stability and symmetric strength and tone of shoulders, elbows, wrist, hip and ankles bilaterally.  Knee: Lateral Normal to inspection with no erythema or effusion or obvious bony abnormalities. Palpation normal with no warmth, joint line tenderness, patellar tenderness, or condyle tenderness. ROM full in flexion and extension and lower leg rotation. Ligaments with solid consistent endpoints including ACL, PCL, LCL, MCL. Negative Mcmurray's, Apley's, and Thessalonian tests. painful patellar compression. Patellar glide moderate crepitus. Patellar and quadriceps tendons unremarkable. Hamstring and quadriceps strength is normal.   After informed written and verbal consent, patient was seated on exam table. Right knee was prepped with alcohol swab and utilizing anterolateral approach, patient's right knee space was injected with 4:1  marcaine 0.5%: Kenalog 40mg /dL. Patient tolerated the procedure well without immediate complications.  After informed written and verbal consent, patient was seated on exam table. Left knee was prepped with alcohol swab and utilizing anterolateral approach, patient's left knee space was injected with 4:1  marcaine 0.5%: Kenalog 40mg /dL. Patient tolerated the procedure well without immediate complications.   Impression and Recommendations:     This case required medical decision making of moderate complexity.      Note: This dictation was prepared with Dragon dictation along with smaller phrase technology. Any transcriptional errors that result from this process are unintentional.

## 2017-06-26 ENCOUNTER — Other Ambulatory Visit: Payer: Self-pay | Admitting: Cardiology

## 2017-06-28 ENCOUNTER — Encounter: Payer: Self-pay | Admitting: Cardiology

## 2017-06-28 ENCOUNTER — Ambulatory Visit (INDEPENDENT_AMBULATORY_CARE_PROVIDER_SITE_OTHER): Payer: Medicare Other | Admitting: Cardiology

## 2017-06-28 ENCOUNTER — Encounter (INDEPENDENT_AMBULATORY_CARE_PROVIDER_SITE_OTHER): Payer: Self-pay

## 2017-06-28 VITALS — BP 120/64 | HR 75 | Ht 64.0 in | Wt 150.0 lb

## 2017-06-28 DIAGNOSIS — E7800 Pure hypercholesterolemia, unspecified: Secondary | ICD-10-CM

## 2017-06-28 DIAGNOSIS — I10 Essential (primary) hypertension: Secondary | ICD-10-CM | POA: Diagnosis not present

## 2017-06-28 DIAGNOSIS — I2510 Atherosclerotic heart disease of native coronary artery without angina pectoris: Secondary | ICD-10-CM

## 2017-06-28 NOTE — Progress Notes (Signed)
Patient ID: [name], female   DOB: Nov 10, 1945, 71 y.o.   MRN: [id number]     Patient Name: Amy Escobar Date of Encounter: 06/28/2017  Primary Care Provider:  Renato Shin, MD Primary Cardiologist:  Amy Escobar  Problem List   Past Medical History:  Diagnosis Date  . Alkaline phosphatase deficiency    w/u Ne  . Allergic rhinitis   . Anxiety   . Arthritis   . DM2 (diabetes mellitus, type 2) ([place])   . GERD (gastroesophageal reflux disease)   . Gout   . Hemorrhoids   . Hyperlipidemia   . Hypertension   . Mild pulmonary hypertension ([place])   . NSVT (nonsustained ventricular tachycardia) ([place])   . Osteoporosis   . PVC's (premature ventricular contractions)   . Thyroid nodule    small  . Uterine fibroid    Past Surgical History:  Procedure Laterality Date  . CATARACT EXTRACTION Bilateral 2016  . COLONOSCOPY  2007  . echocardiogram (other)  01/16/2002  . 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 stress cardiolite  02/12/2006  . TOENAIL EXCISION    . TUBAL LIGATION     Allergies  Allergies  Allergen Reactions  . Olmesartan Medoxomil     REACTION: headache    HPI  71 year old with hypertension, hyperlipidemia, and known insulin-dependent diabetes mellitus with dyspnea on exertion for which she underwent following tests:   GXT:  Has had DOE - referred here for GXT and also referred to pulmonary. States she is doing better. Trying to lose weight. For PFTs later this month. Today the patient exercised on the standard Bruce protocol for a total of 2:41 minutes.  Poor exercise tolerance.  Adequate blood pressure response.  Clinically negative for chest pain. Test was stopped due to dyspnea; PVCs and over a minute run of bigeminy PVCs..  EKG negative for ischemia. No significant arrhythmia noted.   Lexiscan myoview - negative for scar or ischemia 24 Holter - episodes of nonsustained VT the longest lasting 5 beats for which she underwent Lexiscan  Myoview.  Referred for a cardiology consult. The patient denies any palpitations, chest pain, lower extremity edema, orthopnea, paroxysmal nocturnal dyspnea or prior syncope. She states that in the past she used to be quite active walking for about a mile every day but she stopped in November since then she felt quite short of breath.  She is coming after 2 months, normal stress test, echo showed mild MR, TR, RVSP 47 mmHg, she was started on 2.5 mg of amlodipine and feels significantly better. She exercises 5 x week and feels great. Mild dyspnea on moderate exertion.   06/16/16 - 1 year follow up, the patient feels great, she is very active and not limited in her activities of daily living. She denies CP< has stable DOE, no palpitations, dizziness or syncope.  She has been complaint with her meds and has no side effects. She continues to have knee pains. Muscle pain with simvastatin has resolved.  06/28/2017 - 1 year follow up, the patient is doing well, she is active and denies any chest pain or SOB, she has no palpitations, claudications, no dizziness or syncope. She is tolerating her meds well.  Home Medications  Prior to Admission medications   Medication Sig Start Date End Date Taking? Authorizing Provider  Blood Glucose Monitoring Suppl (ONE TOUCH ULTRA MINI) W/DEVICE KIT 1 Device by Does not apply route once. 11/25/12  Yes Amy Shin, MD  gabapentin (NEURONTIN) 300  MG capsule One three times a day 09/12/13  Yes Amy Rockers, MD  naproxen (NAPROSYN) 500 MG tablet Take 1 tablet (500 mg total) by mouth 2 (two) times daily with a meal. 08/10/12  Yes Amy Shin, MD  NEXIUM 40 MG capsule TAKE 1 CAPSULE (40 MG TOTAL) BY MOUTH DAILY BEFORE BREAKFAST.   Yes Amy Shin, MD  ONE TOUCH ULTRA TEST test strip TEST BLOOD SUGAR THREE(3) TIMES DAILY 06/24/13  Yes Amy Shin, MD  [facility] DELICA LANCETS 78L [place] USE AS DIRECTED BY PRESCRIBER THREE TIMES A DAY   Yes Amy Shin, MD  simvastatin  (ZOCOR) 80 MG tablet TAKE 1 TABLET BY MOUTH EVERY NIGHT AT BEDTIME   Yes Amy Shin, MD  valsartan-hydrochlorothiazide (DIOVAN-HCT) 160-12.5 MG per tablet TAKE 1 TABLET BY MOUTH DAILY 07/29/13  Yes Amy Shin, MD    Family History  Family History  Problem Relation Age of Onset  . Heart disease Mother   . Heart disease Father   . Heart disease Maternal Grandfather   . Rectal cancer Maternal Grandfather   . Stomach cancer Maternal Grandmother   . Colon cancer Neg Hx   . Breast cancer Neg Hx     Social History  Social History   Socioeconomic History  . Marital status: Widowed    Spouse name: Not on file  . Number of children: 2  . Years of education: Not on file  . Highest education level: Not on file  Social Needs  . Financial resource strain: Not on file  . Food insecurity - worry: Not on file  . Food insecurity - inability: Not on file  . Transportation needs - medical: Not on file  . Transportation needs - non-medical: Not on file  Occupational History  . Occupation: Surveyor, minerals: UNEMPLOYED  Tobacco Use  . Smoking status: Former Smoker    Packs/day: 0.25    Years: 5.00    Pack years: 1.25    Types: Cigarettes    Last attempt to quit: 06/29/1968    Years since quitting: 49.0  . Smokeless tobacco: Never Used  Substance and Sexual Activity  . Alcohol use: No    Alcohol/week: 0.0 oz  . Drug use: No  . Sexual activity: Not on file  Other Topics Concern  . Not on file  Social History Narrative   Widowed 2010.     Review of Systems, as per HPI, otherwise negative General:  No chills, fever, night sweats or weight changes.  Cardiovascular:  No chest pain, dyspnea on exertion, edema, orthopnea, palpitations, paroxysmal nocturnal dyspnea. Dermatological: No rash, lesions/masses Respiratory: No cough, dyspnea Urologic: No hematuria, dysuria Abdominal:   No nausea, vomiting, diarrhea, bright red blood per rectum, melena, or hematemesis Neurologic:  No  visual changes, wkns, changes in mental status. All other systems reviewed and are otherwise negative except as noted above.  Physical Exam  Blood pressure 120/64, pulse 75, height _0  (1.626 m), weight 150 lb (68 kg).  General: Pleasant, NAD Psych: Normal affect. Neuro: Alert and oriented X 3. Moves all extremities spontaneously. HEENT: Normal  Neck: Supple without bruits or JVD. Lungs:  Resp regular and unlabored, CTA. Heart: RRR no s3, s4, or murmurs. Abdomen: Soft, non-tender, non-distended, BS + x 4.  Extremities: No clubbing, cyanosis or edema. DP/PT/Radials 2+ and equal bilaterally.  Labs:  No results for input(s): CKTOTAL, CKMB, TROPONINI in the last 72 hours. Lab Results  Component Value Date   WBC WILL FOLLOW  12/18/2016   HGB WILL FOLLOW 12/18/2016   HCT WILL FOLLOW 12/18/2016   MCV WILL FOLLOW 12/18/2016   PLT WILL FOLLOW 12/18/2016    Lab Results  Component Value Date   DDIMER <0.27 02/15/2015   Invalid input(s): POCBNP    Component Value Date/Time   NA 139 04/07/2017 0805   K 3.9 04/07/2017 0805   CL 100 04/07/2017 0805   CO2 23 04/07/2017 0805   GLUCOSE 99 04/07/2017 0805   GLUCOSE 110 (H) 08/12/2016 0818   BUN 14 04/07/2017 0805   CREATININE 0.90 04/07/2017 0805   CREATININE 0.73 06/08/2016 0801   CALCIUM 9.7 04/07/2017 0805   PROT 7.5 04/07/2017 0805   ALBUMIN 4.2 04/07/2017 0805   AST 30 04/07/2017 0805   ALT 24 04/07/2017 0805   ALKPHOS 97 04/07/2017 0805   BILITOT 0.4 04/07/2017 0805   GFRNONAA 65 04/07/2017 0805   GFRAA 74 04/07/2017 0805   Lab Results  Component Value Date   CHOL 144 04/07/2017   HDL 39 (L) 04/07/2017   LDLCALC 88 04/07/2017   TRIG 84 04/07/2017   Accessory Clinical Findings  Echocardiogram - 09/20/2013 - Left ventricle: The cavity size was normal. Wall thickness was normal. Systolic function was low normal to mildly reduced. The estimated ejection fraction was in the range of 50% to 55%. Wall motion was normal;  there were no regional wall motion abnormalities. Doppler parameters are consistent with abnormal left ventricular relaxation (grade 1 diastolic dysfunction). - Aortic valve: There was no stenosis. - Mitral valve: Mildly calcified annulus. Normal thickness leaflets . Mild regurgitation. - Left atrium: The atrium was mildly dilated. - Right ventricle: The cavity size was normal. Systolic function was normal. - Tricuspid valve: Peak RV-RA gradient: 62m Hg (S). - Pulmonary arteries: PA peak pressure: 453mHg (S). - Systemic veins: IVC not visualized. Impressions:  - Normal LV size with low normal to mildly reduced systolic function, EF 5033-29%Normal RV size and systolic function. Mild MR. Mild pulmonary hypertension.  04/10/2014 Left ventricle: The cavity size was normal. Wall thickness was increased in a pattern of mild LVH. Systolic function was normal. The estimated ejection fraction was in the range of 55% to 60%. Wall motion was normal; there were no regional wall motion abnormalities. Doppler parameters are consistent with abnormal left ventricular relaxation (grade 1 diastolic dysfunction). - Pulmonary arteries: Systolic pressure was mildly increased. PA peak pressure: 36 mm Hg (S).  Impressions:  - Normal LV function; mild LVH; grade 1 diastolic dysfunction; mild TR; mildly elevated pulmonary pressure. Compared to 09/20/13, LV function remains normal.   Lexiscan Myoview: 09/21/2013 Impression  Exercise Capacity: Lexiscan with low level exercise.  BP Response: Normal blood pressure response.  Clinical Symptoms: No symptoms.  ECG Impression: There are scattered PVCs.  Comparison with Prior Nuclear Study: No previous nuclear study performed  Overall Impression: Low risk stress nuclear study with apical thinning defect and small area of inferolateral bowel artifact.  LV Ejection Fraction: 53%. LV Wall Motion: NL LV Function; NL Wall Motion  ECG -  performed on 06/28/2017 showed sinus rhythm, normal EKG. Unchanged from prior, personally reviewed.    Assessment & Plan  A pleasant [age]ear old female who underwent extensive workup for evaluation of dyspnea on exertion. A nuclear stress test showed normal ejection fraction no prior infarct and no ischemia. Normal ECG.  1.DOE - Echocardiogram showed preserved LV ejection fraction, grade 1 diastolic dysfunction and mild pulmonary hypertension with PA pressure 47 mmHg - she was  started on amlodipine with significant symptoms improvement and decrease of her RVSP to 36 mmHg. Dyspnea on exertion- due to pulmonary hypertension, normal stress test.  Stable, continue amlodipine. She continues to be very active and is asymptomatic.   2. Muscle pain with statins, borderline elevated LFTs with simvastatin, now normal LFTs and lipids at goal on rosuvastatin 5 mg po daily.  3. Hypertension - controlled.  4. Hyperlipidemia - lipids at goal on rosuvastatin 5 mg po daily.  Follow up in 1 year with CMP and lipids prior to the visit.    Amy Dawley, MD, [facility] 06/28/2017, 9:25 AM

## 2017-06-28 NOTE — Patient Instructions (Signed)
Medication Instructions:  Your provider recommends that you continue on your current medications as directed. Please refer to the Current Medication list given to you today.    Labwork: None  Testing/Procedures: None  Follow-Up: Your provider wants you to follow-up in: 6 months with Dr. Meda Coffee. You will receive a reminder letter in the mail two months in advance. If you don't receive a letter, please call our office to schedule the follow-up appointment.    Any Other Special Instructions Will Be Listed Below (If Applicable).     If you need a refill on your cardiac medications before your next appointment, please call your pharmacy.  s

## 2017-07-02 ENCOUNTER — Telehealth: Payer: Self-pay | Admitting: Endocrinology

## 2017-07-02 ENCOUNTER — Other Ambulatory Visit: Payer: Self-pay

## 2017-07-02 MED ORDER — GLUCOSE BLOOD VI STRP: 1.0000 | ORAL_STRIP | Freq: Every day | 2 refills | Status: DC

## 2017-07-02 NOTE — Telephone Encounter (Signed)
Need refill of  Original Order:  ONE TOUCH ULTRA TEST test strip [[id number]]    Pharmacy:  [facility] [address] [place]

## 2017-07-12 NOTE — Progress Notes (Signed)
[facility] [place], [place] [address] Phone: [phone] Subjective:    I'm seeing this patient by the request  of:    CC: Knee pain  DVV:[id number]  Amy Escobar is a 72 y.o. female coming in with complaint of bilateral knee pain.  Patient has known osteoarthritic changes in the knees.  Has failed most conservative therapy previously.  Given injection June 15, 2017.  Patient was to continue home exercises and icing regimen.  Patient states that her left knee is worse than last visit. She said that her knee feels like it is going to buckle due to the pain. She fears she is going fall. Her right knee also continues to bother her.  Patient is having instability of the left knee.      Past Medical History:  Diagnosis Date  . Alkaline phosphatase deficiency    w/u Ne  . Allergic rhinitis   . Anxiety   . Arthritis   . DM2 (diabetes mellitus, type 2) ([place])   . GERD (gastroesophageal reflux disease)   . Gout   . Hemorrhoids   . Hyperlipidemia   . Hypertension   . Mild pulmonary hypertension ([place])   . NSVT (nonsustained ventricular tachycardia) ([place])   . Osteoporosis   . PVC's (premature ventricular contractions)   . Thyroid nodule    small  . Uterine fibroid    Past Surgical History:  Procedure Laterality Date  . CATARACT EXTRACTION Bilateral 2016  . COLONOSCOPY  2007  . echocardiogram (other)  01/16/2002  . 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 stress cardiolite  02/12/2006  . TOENAIL EXCISION    . TUBAL LIGATION     Social History   Socioeconomic History  . Marital status: Widowed    Spouse name: None  . Number of children: 2  . Years of education: None  . Highest education level: None  Social Needs  . Financial resource strain: None  . Food insecurity - worry: None  . Food insecurity - inability: None  . Transportation needs - medical: None  . Transportation needs - non-medical: None  Occupational  History  . Occupation: Surveyor, minerals: UNEMPLOYED  Tobacco Use  . Smoking status: Former Smoker    Packs/day: 0.25    Years: 5.00    Pack years: 1.25    Types: Cigarettes    Last attempt to quit: 06/29/1968    Years since quitting: 49.0  . Smokeless tobacco: Never Used  Substance and Sexual Activity  . Alcohol use: No    Alcohol/week: 0.0 oz  . Drug use: No  . Sexual activity: None  Other Topics Concern  . None  Social History Narrative   Widowed 2010.    Allergies  Allergen Reactions  . Olmesartan Medoxomil     REACTION: headache   Family History  Problem Relation Age of Onset  . Heart disease Mother   . Heart disease Father   . Heart disease Maternal Grandfather   . Rectal cancer Maternal Grandfather   . Stomach cancer Maternal Grandmother   . Colon cancer Neg Hx   . Breast cancer Neg Hx      Past medical history, social, surgical and family history all reviewed in electronic medical record.  No pertanent information unless stated regarding to the chief complaint.   Review of Systems:Review of systems updated and as accurate as of 07/13/17  No headache, visual changes, nausea, vomiting, diarrhea,  constipation, dizziness, abdominal pain, skin rash, fevers, chills, night sweats, weight loss, swollen lymph nodes, body aches, joint swelling,  chest pain, shortness of breath, mood changes.  Positive muscle aches  Objective  Blood pressure 110/64, pulse 76, height 5' 4.5" (1.638 m), weight 150 lb (68 kg), SpO2 98 %. Systems examined below as of 07/13/17   General: No apparent distress alert and oriented x3 mood and affect normal, dressed appropriately.  HEENT: Pupils equal, extraocular movements intact  Respiratory: Patient's speak in full sentences and does not appear short of breath  Cardiovascular: No lower extremity edema, non tender, no erythema  Skin: Warm dry intact with no signs of infection or rash on extremities or on axial skeleton.  Abdomen: Soft  nontender  Neuro: Cranial nerves II through XII are intact, neurovascularly intact in all extremities with 2+ DTRs and 2+ pulses.  Lymph: No lymphadenopathy of posterior or anterior cervical chain or axillae bilaterally.  Gait normal with good balance and coordination.  MSK:  Non tender with full range of motion and good stability and symmetric strength and tone of shoulders, elbows, wrist, hip and ankles bilaterally.  Knee: Left valgus deformity noted. abnormal thigh to calf ratio.  Tender to palpation over medial and PF joint line.  ROM full in flexion and extension and lower leg rotation. instability with valgus force.  painful patellar compression. Patellar glide with moderate crepitus. Patellar and quadriceps tendons unremarkable. Hamstring and quadriceps strength is normal. Contralateral knee shows arthritic changes with some mild instability as well but not as severe.  Tenderness over the medial joint line.  After informed written and verbal consent, patient was seated on exam table. Right knee was prepped with alcohol swab and utilizing anterolateral approach, patient's right knee space was injected with 22 mg/1 mL of Monovisc(sodium hyaluronate) in a prefilled syringe was injected easily into the knee through a 22-gauge needle..Patient tolerated the procedure well without immediate complications.  After informed written and verbal consent, patient was seated on exam table. Right knee was prepped with alcohol swab and utilizing anterolateral approach, patient's right knee space was injected with 22mg /70mL Monovisc (sodium hyaluronate) in a prefilled syringe was injected easily into the knee through a 22-gauge needle..Patient tolerated the procedure well without immediate complications.    Impression and Recommendations:     This case required medical decision making of moderate complexity.      Note: This dictation was prepared with Dragon dictation along with smaller phrase  technology. Any transcriptional errors that result from this process are unintentional.

## 2017-07-13 ENCOUNTER — Encounter: Payer: Self-pay | Admitting: Family Medicine

## 2017-07-13 ENCOUNTER — Ambulatory Visit: Payer: Medicare Other | Admitting: Family Medicine

## 2017-07-13 DIAGNOSIS — M170 Bilateral primary osteoarthritis of knee: Secondary | ICD-10-CM | POA: Diagnosis not present

## 2017-07-13 NOTE — Assessment & Plan Note (Signed)
Patient was given Visco supplementation.  Responded very well to Synvisc previously 2 years ago.  Hopefully patient is well.  We discussed icing regimen and home exercises.  Patient is having increasing instability of the left knee and secondary to abnormal thigh to calf ratio custom bracing will be necessary.  Follow-up with me again in 4-6 weeks

## 2017-07-13 NOTE — Patient Instructions (Signed)
Good to see you  Will take 2 weeks to notice improvement See me again in 6-8 weeks.

## 2017-07-14 DIAGNOSIS — R3914 Feeling of incomplete bladder emptying: Secondary | ICD-10-CM | POA: Diagnosis not present

## 2017-07-14 DIAGNOSIS — R350 Frequency of micturition: Secondary | ICD-10-CM | POA: Diagnosis not present

## 2017-07-19 ENCOUNTER — Other Ambulatory Visit: Payer: Self-pay | Admitting: Family Medicine

## 2017-07-19 NOTE — Telephone Encounter (Signed)
Refill done.  

## 2017-07-25 NOTE — Congregational Nurse Program (Signed)
Congregational Nurse Program Note  Date of Encounter: 02/15/2016  Past Medical History: Past Medical History:  Diagnosis Date  . Alkaline phosphatase deficiency    w/u Ne  . Allergic rhinitis   . Anxiety   . Arthritis   . DM2 (diabetes mellitus, type 2) ([place])   . GERD (gastroesophageal reflux disease)   . Gout   . Hemorrhoids   . Hyperlipidemia   . Hypertension   . Mild pulmonary hypertension ([place])   . NSVT (nonsustained ventricular tachycardia) ([place])   . Osteoporosis   . PVC's (premature ventricular contractions)   . Thyroid nodule    small  . Uterine fibroid     Encounter Details: Allowed client to ventilate and voice concerns about some stressors in her life.  Concerned about health and tries to be mindful of her food choices.  Financial stressors are of frequent concern.Marland Kitchen

## 2017-07-25 NOTE — Congregational Nurse Program (Signed)
Congregational Nurse Program Note  Date of Encounter: 07/25/2017  Past Medical History: Past Medical History:  Diagnosis Date  . Alkaline phosphatase deficiency    w/u Ne  . Allergic rhinitis   . Anxiety   . Arthritis   . DM2 (diabetes mellitus, type 2) ([place])   . GERD (gastroesophageal reflux disease)   . Gout   . Hemorrhoids   . Hyperlipidemia   . Hypertension   . Mild pulmonary hypertension ([place])   . NSVT (nonsustained ventricular tachycardia) ([place])   . Osteoporosis   . PVC's (premature ventricular contractions)   . Thyroid nodule    small  . Uterine fibroid     Encounter Details: CNP Questionnaire - 07/25/17 2237      Questionnaire   Patient Status  Not Applicable    Race  Black or African American    Location Patient Served At  [place]  No food insecurities    Housing/Utilities  Yes, have permanent housing    Transportation  No transportation needs    Interpersonal Safety  Yes, feel physically and emotionally safe where you currently live    Medication  No medication insecurities    Medical Provider  Yes    Referrals  Not Applicable    ED Visit Averted  Not Applicable    Life-Saving Intervention Made  Not Applicable     Client reports feeling better today.  Has had success in losing some weight and still working on diet

## 2017-07-26 DIAGNOSIS — M1712 Unilateral primary osteoarthritis, left knee: Secondary | ICD-10-CM | POA: Diagnosis not present

## 2017-08-10 ENCOUNTER — Telehealth: Payer: Self-pay | Admitting: *Deleted

## 2017-08-10 NOTE — Telephone Encounter (Signed)
Dr. Meda Coffee or Jinny Blossom, is there any contraindication with this product with taking her cardiac meds or with her history? Please advise and thank you!

## 2017-08-10 NOTE — Telephone Encounter (Signed)
I looked at the nutritional facts for the super food powder and there are no contraindications with patient's current medications.

## 2017-08-10 NOTE — Telephone Encounter (Signed)
Notified the pt that per our Pharmacist, this is safe for use with her meds and her cardiac hx.  Pt verbalized understanding and gracious for all the assistance provided.

## 2017-08-10 NOTE — Telephone Encounter (Signed)
Patient called and stated that she ordered super food organic powder off of tv and she would like to know if it is safe for her to take from a cardiac standpoint. She would like a call back at [phone]. Thanks, MI

## 2017-08-11 ENCOUNTER — Other Ambulatory Visit: Payer: Self-pay | Admitting: Endocrinology

## 2017-08-11 DIAGNOSIS — Z139 Encounter for screening, unspecified: Secondary | ICD-10-CM

## 2017-08-12 ENCOUNTER — Ambulatory Visit (INDEPENDENT_AMBULATORY_CARE_PROVIDER_SITE_OTHER): Payer: Medicare Other | Admitting: Endocrinology

## 2017-08-12 ENCOUNTER — Encounter: Payer: Self-pay | Admitting: Endocrinology

## 2017-08-12 VITALS — BP 118/76 | HR 72 | Wt 155.4 lb

## 2017-08-12 DIAGNOSIS — H9203 Otalgia, bilateral: Secondary | ICD-10-CM

## 2017-08-12 DIAGNOSIS — M810 Age-related osteoporosis without current pathological fracture: Secondary | ICD-10-CM

## 2017-08-12 DIAGNOSIS — E118 Type 2 diabetes mellitus with unspecified complications: Secondary | ICD-10-CM | POA: Diagnosis not present

## 2017-08-12 DIAGNOSIS — M1A9XX0 Chronic gout, unspecified, without tophus (tophi): Secondary | ICD-10-CM | POA: Diagnosis not present

## 2017-08-12 DIAGNOSIS — Z0000 Encounter for general adult medical examination without abnormal findings: Secondary | ICD-10-CM | POA: Diagnosis not present

## 2017-08-12 LAB — POCT GLYCOSYLATED HEMOGLOBIN (HGB A1C): HEMOGLOBIN A1C: 5.4

## 2017-08-12 LAB — VITAMIN D 25 HYDROXY (VIT D DEFICIENCY, FRACTURES)

## 2017-08-12 LAB — MICROALBUMIN / CREATININE URINE RATIO
Creatinine,U: 59.3 mg/dL
Microalb Creat Ratio: 1.2 mg/g (ref 0.0–30.0)
Microalb, Ur: <0.7 mg/dL (ref 0.0–1.9)

## 2017-08-12 LAB — URIC ACID: Uric Acid, Serum: 5.3 mg/dL (ref 2.4–7.0)

## 2017-08-12 LAB — TSH: TSH: 1.97 u[IU]/mL (ref 0.35–4.50)

## 2017-08-12 MED ORDER — CEPHALEXIN 250 MG PO CAPS: 250.0000 mg | ORAL_CAPSULE | Freq: Three times a day (TID) | ORAL | 0 refills | Status: DC

## 2017-08-12 NOTE — Progress Notes (Signed)
Subjective:    Patient ID: Amy Escobar, female    DOB: Feb 02, 1946, 72 y.o.   MRN: [id number]  HPI Pt is here for regular wellness examination, and is feeling pretty well in general, and says chronic med probs are stable, except as noted below Past Medical History:  Diagnosis Date  . Alkaline phosphatase deficiency    w/u Ne  . Allergic rhinitis   . Anxiety   . Arthritis   . DM2 (diabetes mellitus, type 2) ([place])   . GERD (gastroesophageal reflux disease)   . Gout   . Hemorrhoids   . Hyperlipidemia   . Hypertension   . Mild pulmonary hypertension ([place])   . NSVT (nonsustained ventricular tachycardia) ([place])   . Osteoporosis   . PVC's (premature ventricular contractions)   . Thyroid nodule    small  . Uterine fibroid     Past Surgical History:  Procedure Laterality Date  . CATARACT EXTRACTION Bilateral 2016  . COLONOSCOPY  2007  . echocardiogram (other)  01/16/2002  . 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 stress cardiolite  02/12/2006  . TOENAIL EXCISION    . TUBAL LIGATION      Social History   Socioeconomic History  . Marital status: Widowed    Spouse name: Not on file  . Number of children: 2  . Years of education: Not on file  . Highest education level: Not on file  Social Needs  . Financial resource strain: Not on file  . Food insecurity - worry: Not on file  . Food insecurity - inability: Not on file  . Transportation needs - medical: Not on file  . Transportation needs - non-medical: Not on file  Occupational History  . Occupation: Surveyor, minerals: UNEMPLOYED  Tobacco Use  . Smoking status: Former Smoker    Packs/day: 0.25    Years: 5.00    Pack years: 1.25    Types: Cigarettes    Last attempt to quit: 06/29/1968    Years since quitting: 49.1  . Smokeless tobacco: Never Used  Substance and Sexual Activity  . Alcohol use: No    Alcohol/week: 0.0 oz  . Drug use: No  . Sexual activity: Not on file  Other Topics Concern  . Not on  file  Social History Narrative   Widowed 2010.     Current Outpatient Medications on File Prior to Visit  Medication Sig Dispense Refill  . amLODipine (NORVASC) 5 MG tablet Take 1 tablet (5 mg total) by mouth daily. 90 tablet 2  . aspirin EC 81 MG tablet Take 81 mg by mouth daily.    Marland Kitchen esomeprazole (NEXIUM) 40 MG capsule TAKE 1 CAPSULE (40 MG TOTAL) BY MOUTH DAILY BEFORE BREAKFAST. 90 capsule 7  . fesoterodine (TOVIAZ) 4 MG TB24 tablet Take 4 mg daily by mouth.    . gabapentin (NEURONTIN) 600 MG tablet TAKE 1 TABLET (600 MG TOTAL) BY MOUTH 3 (THREE) TIMES DAILY. 270 tablet 9  . glucose blood (ONE TOUCH ULTRA TEST) test strip 1 each by Other route daily. And lancets 1/day 100 each 2  . loratadine-pseudoephedrine (CLARITIN-D 12-HOUR) 5-120 MG tablet Take 1 tablet by mouth daily. 30 tablet 11  . metFORMIN (GLUCOPHAGE-XR) 500 MG 24 hr tablet TAKE TWO TABLETS BY MOUTH TWICE A DAY 360 tablet 0  . promethazine-dextromethorphan (PROMETHAZINE-DM) 6.25-15 MG/5ML syrup Take 2.5 mLs 4 (four) times daily as needed by mouth for cough. 118 mL 1  . rosuvastatin (CRESTOR)  5 MG tablet TAKE ONE TABLET BY MOUTH DAILY 30 tablet 11  . triamcinolone cream (KENALOG) 0.1 % APPLY 1 APPLICATION TOPICALLY 4 (FOUR) TIMES DAILY AS NEEDED FOR RASH 45 g 1  . UNKNOWN TO PATIENT Inject as directed every 6 (six) months. 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 valsartan-hydrochlorothiazide (DIOVAN-HCT) 160-12.5 MG tablet TAKE ONE TABLET BY MOUTH DAILY 90 tablet 2  . Vitamin D, Ergocalciferol, (DRISDOL) 50000 units CAPS capsule TAKE ONE CAPSULE BY MOUTH ONCE WEEKLY 12 capsule 0   No current facility-administered medications on file prior to visit.     Allergies  Allergen Reactions  . Olmesartan Medoxomil     REACTION: headache    Family History  Problem Relation Age of Onset  . Heart disease Mother   . Heart disease Father   . Heart disease Maternal Grandfather   . Rectal cancer Maternal Grandfather   .  Stomach cancer Maternal Grandmother   . Colon cancer Neg Hx   . Breast cancer Neg Hx     BP 118/76 (BP Location: Left Arm, Patient Position: Sitting, Cuff Size: Normal)   Pulse 72   Wt 155 lb 6.4 oz (70.5 kg)   SpO2 95%   BMI 26.26 kg/m    Review of Systems Denies fatigue, visual loss, hearing loss, chest pain, sob, depression, cold intolerance, BRBPR, hematuria, syncope, numbness, easy bruising, and rash. No change in chronic back pain and rhinorrhea.     Objective:   Physical Exam VS: see vs page GEN: no distress HEAD: head: no deformity eyes: no periorbital swelling, no proptosis external nose and ears are normal mouth: no lesion seen NECK: supple, thyroid is not enlarged CHEST WALL: no deformity LUNGS:  Clear to auscultation CV: reg rate and rhythm, no murmur ABD: abdomen is soft, nontender.  no hepatosplenomegaly.  not distended.  no hernia.  MUSCULOSKELETAL: muscle bulk and strength are grossly normal.  no obvious joint swelling.  gait is normal and steady EXTEMITIES: no deformity.  no ulcer on the feet.  feet are of normal color and temp.  no edema.  Both great toenails are absent.   PULSES: dorsalis pedis intact bilat.  no carotid bruit NEURO:  cn 2-12 grossly intact.   readily moves all 4's.  sensation is intact to touch on the feet.  SKIN:  Normal texture and temperature.  No rash or suspicious lesion is visible.   NODES:  None palpable at the neck.  PSYCH: alert, well-oriented.  Does not appear anxious nor depressed.     Lab Results  Component Value Date   HGBA1C 5.4 08/12/2017    Lab Results  Component Value Date   ALT 24 04/07/2017   AST 30 04/07/2017   ALKPHOS 97 04/07/2017   BILITOT 0.4 04/07/2017        Assessment & Plan:  Wellness visit today, with problems stable, except as noted.   Patient Instructions  Please consider these measures for your health:  minimize alcohol.  Do not use tobacco products.  Have a colonoscopy at least every 10  years from age 84.  Women should have an annual mammogram from age 90.  Keep firearms safely stored.  Always use seat belts.  have working smoke alarms in your home.  See an eye doctor and dentist regularly.  Never drive under the influence of alcohol or drugs (including prescription drugs).   Please continue the same medications.  I have sent a prescription to your pharmacy, for  an antibiotic pill. Please come back for a follow-up appointment in 6 months.     SEPARATE EVALUATION FOLLOWS--EACH PROBLEM HERE IS NEW, NOT RESPONDING TO TREATMENT, OR POSES SIGNIFICANT RISK TO THE PATIENT'S HEALTH: HISTORY OF THE PRESENT ILLNESS: Few days of moderate bilat ear pain, but no assoc nasal congestion PAST MEDICAL HISTORY Past Medical History:  Diagnosis Date  . Alkaline phosphatase deficiency    w/u Ne  . Allergic rhinitis   . Anxiety   . Arthritis   . DM2 (diabetes mellitus, type 2) ([place])   . GERD (gastroesophageal reflux disease)   . Gout   . Hemorrhoids   . Hyperlipidemia   . Hypertension   . Mild pulmonary hypertension ([place])   . NSVT (nonsustained ventricular tachycardia) ([place])   . Osteoporosis   . PVC's (premature ventricular contractions)   . Thyroid nodule    small  . Uterine fibroid     Past Surgical History:  Procedure Laterality Date  . CATARACT EXTRACTION Bilateral 2016  . COLONOSCOPY  2007  . echocardiogram (other)  01/16/2002  . 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 stress cardiolite  02/12/2006  . TOENAIL EXCISION    . TUBAL LIGATION      Social History   Socioeconomic History  . Marital status: Widowed    Spouse name: Not on file  . Number of children: 2  . Years of education: Not on file  . Highest education level: Not on file  Social Needs  . Financial resource strain: Not on file  . Food insecurity - worry: Not on file  . Food insecurity - inability: Not on file  . Transportation needs - medical: Not on file  . Transportation needs - non-medical: Not  on file  Occupational History  . Occupation: Surveyor, minerals: UNEMPLOYED  Tobacco Use  . Smoking status: Former Smoker    Packs/day: 0.25    Years: 5.00    Pack years: 1.25    Types: Cigarettes    Last attempt to quit: 06/29/1968    Years since quitting: 49.1  . Smokeless tobacco: Never Used  Substance and Sexual Activity  . Alcohol use: No    Alcohol/week: 0.0 oz  . Drug use: No  . Sexual activity: Not on file  Other Topics Concern  . Not on file  Social History Narrative   Widowed 2010.     Current Outpatient Medications on File Prior to Visit  Medication Sig Dispense Refill  . amLODipine (NORVASC) 5 MG tablet Take 1 tablet (5 mg total) by mouth daily. 90 tablet 2  . aspirin EC 81 MG tablet Take 81 mg by mouth daily.    Marland Kitchen esomeprazole (NEXIUM) 40 MG capsule TAKE 1 CAPSULE (40 MG TOTAL) BY MOUTH DAILY BEFORE BREAKFAST. 90 capsule 7  . fesoterodine (TOVIAZ) 4 MG TB24 tablet Take 4 mg daily by mouth.    . gabapentin (NEURONTIN) 600 MG tablet TAKE 1 TABLET (600 MG TOTAL) BY MOUTH 3 (THREE) TIMES DAILY. 270 tablet 9  . glucose blood (ONE TOUCH ULTRA TEST) test strip 1 each by Other route daily. And lancets 1/day 100 each 2  . loratadine-pseudoephedrine (CLARITIN-D 12-HOUR) 5-120 MG tablet Take 1 tablet by mouth daily. 30 tablet 11  . metFORMIN (GLUCOPHAGE-XR) 500 MG 24 hr tablet TAKE TWO TABLETS BY MOUTH TWICE A DAY 360 tablet 0  . promethazine-dextromethorphan (PROMETHAZINE-DM) 6.25-15 MG/5ML syrup Take 2.5 mLs 4 (four) times daily as needed by mouth for cough.  118 mL 1  . rosuvastatin (CRESTOR) 5 MG tablet TAKE ONE TABLET BY MOUTH DAILY 30 tablet 11  . triamcinolone cream (KENALOG) 0.1 % APPLY 1 APPLICATION TOPICALLY 4 (FOUR) TIMES DAILY AS NEEDED FOR RASH 45 g 1  . UNKNOWN TO PATIENT Inject as directed every 6 (six) months. 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 valsartan-hydrochlorothiazide (DIOVAN-HCT) 160-12.5 MG tablet TAKE ONE TABLET BY MOUTH DAILY 90  tablet 2  . Vitamin D, Ergocalciferol, (DRISDOL) 50000 units CAPS capsule TAKE ONE CAPSULE BY MOUTH ONCE WEEKLY 12 capsule 0   No current facility-administered medications on file prior to visit.     Allergies  Allergen Reactions  . Olmesartan Medoxomil     REACTION: headache    Family History  Problem Relation Age of Onset  . Heart disease Mother   . Heart disease Father   . Heart disease Maternal Grandfather   . Rectal cancer Maternal Grandfather   . Stomach cancer Maternal Grandmother   . Colon cancer Neg Hx   . Breast cancer Neg Hx     BP 118/76 (BP Location: Left Arm, Patient Position: Sitting, Cuff Size: Normal)   Pulse 72   Wt 155 lb 6.4 oz (70.5 kg)   SpO2 95%   BMI 26.26 kg/m   REVIEW OF SYSTEMS: Denies fever PHYSICAL EXAMINATION: VITAL SIGNS:  See vs page GENERAL: no distress Ears: both tm's are red IMPRESSION: AOM, new PLAN:  I rx'ed abx

## 2017-08-12 NOTE — Patient Instructions (Addendum)
Please consider these measures for your health:  minimize alcohol.  Do not use tobacco products.  Have a colonoscopy at least every 10 years from age 72.  Women should have an annual mammogram from age 21.  Keep firearms safely stored.  Always use seat belts.  have working smoke alarms in your home.  See an eye doctor and dentist regularly.  Never drive under the influence of alcohol or drugs (including prescription drugs).   Please continue the same medications.  I have sent a prescription to your pharmacy, for an antibiotic pill. Please come back for a follow-up appointment in 6 months.

## 2017-08-13 LAB — PTH, INTACT AND CALCIUM
CALCIUM: 10.1 mg/dL (ref 8.6–10.4)
PTH: 29 pg/mL (ref 14–64)

## 2017-08-23 NOTE — Progress Notes (Deleted)
[name] [address], [place] [address] Phone: [phone] Subjective:      CC: Knee pain follow-up  STM:[id number]  Amy Escobar is a 72 y.o. female coming in with complaint of knee pain.  Found to have moderate osteoarthritic changes.  Given Monovisc 6 weeks ago.  This was bilateral.  Patient states  Onset-  Location Duration-  Character- Aggravating factors- Reliving factors-  Therapies tried-  Severity-     Past Medical History:  Diagnosis Date  . Alkaline phosphatase deficiency    w/u Ne  . Allergic rhinitis   . Anxiety   . Arthritis   . DM2 (diabetes mellitus, type 2) ([place])   . GERD (gastroesophageal reflux disease)   . Gout   . Hemorrhoids   . Hyperlipidemia   . Hypertension   . Mild pulmonary hypertension ([place])   . NSVT (nonsustained ventricular tachycardia) ([place])   . Osteoporosis   . PVC's (premature ventricular contractions)   . Thyroid nodule    small  . Uterine fibroid    Past Surgical History:  Procedure Laterality Date  . CATARACT EXTRACTION Bilateral 2016  . COLONOSCOPY  2007  . echocardiogram (other)  01/16/2002  . 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 stress cardiolite  02/12/2006  . TOENAIL EXCISION    . TUBAL LIGATION     Social History   Socioeconomic History  . Marital status: Widowed    Spouse name: Not on file  . Number of children: 2  . Years of education: Not on file  . Highest education level: Not on file  Social Needs  . Financial resource strain: Not on file  . Food insecurity - worry: Not on file  . Food insecurity - inability: Not on file  . Transportation needs - medical: Not on file  . Transportation needs - non-medical: Not on file  Occupational History  . Occupation: Surveyor, minerals: UNEMPLOYED  Tobacco Use  . Smoking status: Former Smoker    Packs/day: 0.25    Years: 5.00    Pack years: 1.25    Types: Cigarettes    Last attempt to quit: 06/29/1968   Years since quitting: 49.1  . Smokeless tobacco: Never Used  Substance and Sexual Activity  . Alcohol use: No    Alcohol/week: 0.0 oz  . Drug use: No  . Sexual activity: Not on file  Other Topics Concern  . Not on file  Social History Narrative   Widowed 2010.    Allergies  Allergen Reactions  . Olmesartan Medoxomil     REACTION: headache   Family History  Problem Relation Age of Onset  . Heart disease Mother   . Heart disease Father   . Heart disease Maternal Grandfather   . Rectal cancer Maternal Grandfather   . Stomach cancer Maternal Grandmother   . Colon cancer Neg Hx   . Breast cancer Neg Hx      Past medical history, social, surgical and family history all reviewed in electronic medical record.  No pertanent information unless stated regarding to the chief complaint.   Review of Systems:Review of systems updated and as accurate as of 08/23/17  No headache, visual changes, nausea, vomiting, diarrhea, constipation, dizziness, abdominal pain, skin rash, fevers, chills, night sweats, weight loss, swollen lymph nodes, body aches, joint swelling, muscle aches, chest pain, shortness of breath, mood changes.   Objective  There were no vitals taken for this visit.  Systems examined below as of 08/23/17   General: No apparent distress alert and oriented x3 mood and affect normal, dressed appropriately.  HEENT: Pupils equal, extraocular movements intact  Respiratory: Patient's speak in full sentences and does not appear short of breath  Cardiovascular: No lower extremity edema, non tender, no erythema  Skin: Warm dry intact with no signs of infection or rash on extremities or on axial skeleton.  Abdomen: Soft nontender  Neuro: Cranial nerves II through XII are intact, neurovascularly intact in all extremities with 2+ DTRs and 2+ pulses.  Lymph: No lymphadenopathy of posterior or anterior cervical chain or axillae bilaterally.  Gait normal with good balance and coordination.    MSK:  Non tender with full range of motion and good stability and symmetric strength and tone of shoulders, elbows, wrist, hip, knee and ankles bilaterally.     Impression and Recommendations:     This case required medical decision making of moderate complexity.      Note: This dictation was prepared with Dragon dictation along with smaller phrase technology. Any transcriptional errors that result from this process are unintentional.

## 2017-08-24 ENCOUNTER — Ambulatory Visit: Payer: Medicare Other | Admitting: Family Medicine

## 2017-08-24 DIAGNOSIS — Z0289 Encounter for other administrative examinations: Secondary | ICD-10-CM

## 2017-08-25 ENCOUNTER — Other Ambulatory Visit: Payer: Self-pay | Admitting: Endocrinology

## 2017-08-25 ENCOUNTER — Other Ambulatory Visit: Payer: Self-pay | Admitting: Cardiology

## 2017-08-25 DIAGNOSIS — I10 Essential (primary) hypertension: Secondary | ICD-10-CM

## 2017-08-25 DIAGNOSIS — E7800 Pure hypercholesterolemia, unspecified: Secondary | ICD-10-CM

## 2017-09-01 DIAGNOSIS — H31009 Unspecified chorioretinal scars, unspecified eye: Secondary | ICD-10-CM | POA: Diagnosis not present

## 2017-09-01 DIAGNOSIS — H33101 Unspecified retinoschisis, right eye: Secondary | ICD-10-CM | POA: Diagnosis not present

## 2017-09-01 DIAGNOSIS — E119 Type 2 diabetes mellitus without complications: Secondary | ICD-10-CM | POA: Diagnosis not present

## 2017-09-01 DIAGNOSIS — H43813 Vitreous degeneration, bilateral: Secondary | ICD-10-CM | POA: Diagnosis not present

## 2017-09-01 DIAGNOSIS — H33321 Round hole, right eye: Secondary | ICD-10-CM | POA: Diagnosis not present

## 2017-09-03 ENCOUNTER — Ambulatory Visit
Admission: RE | Admit: 2017-09-03 | Discharge: 2017-09-03 | Disposition: A | Discharge status: Home / Self Care | Payer: Medicare Other | Source: Ambulatory Visit | Attending: Endocrinology | Admitting: Endocrinology

## 2017-09-03 DIAGNOSIS — Z139 Encounter for screening, unspecified: Secondary | ICD-10-CM

## 2017-09-03 DIAGNOSIS — Z1231 Encounter for screening mammogram for malignant neoplasm of breast: Secondary | ICD-10-CM | POA: Diagnosis not present

## 2017-09-30 ENCOUNTER — Other Ambulatory Visit: Payer: Self-pay | Admitting: Physician Assistant

## 2017-10-05 ENCOUNTER — Telehealth: Payer: Self-pay | Admitting: *Deleted

## 2017-10-05 ENCOUNTER — Other Ambulatory Visit: Payer: Self-pay | Admitting: Endocrinology

## 2017-10-05 DIAGNOSIS — I10 Essential (primary) hypertension: Secondary | ICD-10-CM

## 2017-10-05 DIAGNOSIS — E7800 Pure hypercholesterolemia, unspecified: Secondary | ICD-10-CM

## 2017-10-05 NOTE — Telephone Encounter (Signed)
Will check the pts lipids and LFTs on 01/06/18, prior to her OV with Dr Meda Coffee on 01/10/18.  Pt made aware of appt date and to come fasting to this lab appt.

## 2017-10-18 ENCOUNTER — Other Ambulatory Visit: Payer: Self-pay | Admitting: Family Medicine

## 2017-10-18 NOTE — Progress Notes (Signed)
[facility] [address] [place], [place] [address] Phone: [phone] Subjective:     CC: Bilateral knee pain  ION:[id number]  Amy Escobar is a 72 y.o. female coming in with complaint of bilateral knee pain. Patient is here today for injections. Her pain has increased over time.  Patient has known arthritic changes of the knees.  Last Visco supplementation July 12, 2017.      Past Medical History:  Diagnosis Date  . Alkaline phosphatase deficiency    w/u Ne  . Allergic rhinitis   . Anxiety   . Arthritis   . DM2 (diabetes mellitus, type 2) ([place])   . GERD (gastroesophageal reflux disease)   . Gout   . Hemorrhoids   . Hyperlipidemia   . Hypertension   . Mild pulmonary hypertension ([place])   . NSVT (nonsustained ventricular tachycardia) ([place])   . Osteoporosis   . PVC's (premature ventricular contractions)   . Thyroid nodule    small  . Uterine fibroid    Past Surgical History:  Procedure Laterality Date  . CATARACT EXTRACTION Bilateral 2016  . COLONOSCOPY  2007  . echocardiogram (other)  01/16/2002  . 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 stress cardiolite  02/12/2006  . TOENAIL EXCISION    . TUBAL LIGATION     Social History   Socioeconomic History  . Marital status: Widowed    Spouse name: Not on file  . Number of children: 2  . Years of education: Not on file  . Highest education level: Not on file  Occupational History  . Occupation: Surveyor, minerals: UNEMPLOYED  Social Needs  . Financial resource strain: Not on file  . Food insecurity:    Worry: Not on file    Inability: Not on file  . Transportation needs:    Medical: Not on file    Non-medical: Not on file  Tobacco Use  . Smoking status: Former Smoker    Packs/day: 0.25    Years: 5.00    Pack years: 1.25    Types: Cigarettes    Last attempt to quit: 06/29/1968    Years since quitting: 49.3  . Smokeless tobacco: Never Used  Substance and Sexual  Activity  . Alcohol use: No    Alcohol/week: 0.0 oz  . Drug use: No  . Sexual activity: Not on file  Lifestyle  . Physical activity:    Days per week: Not on file    Minutes per session: Not on file  . Stress: Not on file  Relationships  . Social connections:    Talks on phone: Not on file    Gets together: Not on file    Attends religious service: Not on file    Active member of club or organization: Not on file    Attends meetings of clubs or organizations: Not on file    Relationship status: Not on file  Other Topics Concern  . Not on file  Social History Narrative   Widowed 2010.    Allergies  Allergen Reactions  . Olmesartan Medoxomil     REACTION: headache   Family History  Problem Relation Age of Onset  . Heart disease Mother   . Heart disease Father   . Heart disease Maternal Grandfather   . Rectal cancer Maternal Grandfather   . Stomach cancer Maternal Grandmother   . Colon cancer Neg Hx   . Breast cancer Neg Hx  Past medical history, social, surgical and family history all reviewed in electronic medical record.  No pertanent information unless stated regarding to the chief complaint.   Review of Systems:Review of systems updated and as accurate as of 10/18/17  No headache, visual changes, nausea, vomiting, diarrhea, constipation, dizziness, abdominal pain, skin rash, fevers, chills, night sweats, weight loss, swollen lymph nodes, body aches, chest pain, shortness of breath, mood changes.  Positive muscle aches and joint swelling  Objective  There were no vitals taken for this visit. Systems examined below as of 10/18/17   General: No apparent distress alert and oriented x3 mood and affect normal, dressed appropriately.  HEENT: Pupils equal, extraocular movements intact  Respiratory: Patient's speak in full sentences and does not appear short of breath  Cardiovascular: No lower extremity edema, non tender, no erythema  Skin: Warm dry intact with no  signs of infection or rash on extremities or on axial skeleton.  Abdomen: Soft nontender  Neuro: Cranial nerves II through XII are intact, neurovascularly intact in all extremities with 2+ DTRs and 2+ pulses.  Lymph: No lymphadenopathy of posterior or anterior cervical chain or axillae bilaterally.  Gait antalgic gait MSK: Mild tender with full range of motion and good stability and symmetric strength and tone of shoulders, elbows, wrist, hip, and ankles bilaterally.  Knee: Bilateral valgus deformity noted.  Abnormal thigh to calf ratio.  Tender to palpation over medial and PF joint line.  ROM full in flexion and extension and lower leg rotation. instability with valgus force.  painful patellar compression. Patellar glide with moderate crepitus. Patellar and quadriceps tendons unremarkable. Hamstring and quadriceps strength is normal.   After informed written and verbal consent, patient was seated on exam table. Right knee was prepped with alcohol swab and utilizing anterolateral approach, patient's right knee space was injected with 4:1  marcaine 0.5%: Kenalog 40mg /dL. Patient tolerated the procedure well without immediate complications.  After informed written and verbal consent, patient was seated on exam table. Left knee was prepped with alcohol swab and utilizing anterolateral approach, patient's left knee space was injected with 4:1  marcaine 0.5%: Kenalog 40mg /dL. Patient tolerated the procedure well without immediate complications.   Impression and Recommendations:     This case required medical decision making of moderate complexity.      Note: This dictation was prepared with Dragon dictation along with smaller phrase technology. Any transcriptional errors that result from this process are unintentional.

## 2017-10-19 ENCOUNTER — Encounter: Payer: Self-pay | Admitting: Family Medicine

## 2017-10-19 ENCOUNTER — Ambulatory Visit: Payer: Medicare Other | Admitting: Family Medicine

## 2017-10-19 DIAGNOSIS — M170 Bilateral primary osteoarthritis of knee: Secondary | ICD-10-CM | POA: Diagnosis not present

## 2017-10-19 NOTE — Assessment & Plan Note (Signed)
Patient does have arthritic changes of the knees bilaterally.  Bilateral injections given today.  Patient would be able to have Visco supplementation again in July.  Patient will continue the icing regimen and home exercises.  Discussed taking topical anti-inflammatories.  See patient again in 3 months

## 2017-10-19 NOTE — Patient Instructions (Signed)
Good to see you  I hope this helps Can do the other injection starting anytime after July 14  pennsaid pinkie amount topically 2 times daily as needed.  Ice 20 minutes 2 times daily. Usually after activity and before bed. See you then!

## 2017-10-26 ENCOUNTER — Telehealth: Payer: Self-pay | Admitting: Endocrinology

## 2017-10-26 NOTE — Telephone Encounter (Signed)
Patient wants RX for One Touch Ultra Mini meter sent to [facility] on [address]

## 2017-10-27 ENCOUNTER — Other Ambulatory Visit: Payer: Self-pay

## 2017-10-27 MED ORDER — ONETOUCH ULTRA MINI W/DEVICE KIT: 1.0000 | PACK | 0 refills | Status: DC | PRN

## 2017-10-27 NOTE — Telephone Encounter (Signed)
I have sent to patient;'s pharmacy.  

## 2017-11-23 ENCOUNTER — Other Ambulatory Visit: Payer: Self-pay | Admitting: Endocrinology

## 2017-12-13 ENCOUNTER — Telehealth: Payer: Self-pay | Admitting: Endocrinology

## 2017-12-13 NOTE — Telephone Encounter (Signed)
Patient not feeling well. Dr. Loanne Drilling is PCP. Please call patient at ph# [phone] per her request

## 2017-12-13 NOTE — Telephone Encounter (Signed)
I have spoken with patient to let her know Dr. Loanne Drilling is out of the office. She said she thought that she had a bug & I advised urgent care.

## 2017-12-31 ENCOUNTER — Other Ambulatory Visit: Payer: Self-pay | Admitting: Endocrinology

## 2018-01-06 ENCOUNTER — Other Ambulatory Visit: Payer: Medicare Other

## 2018-01-06 DIAGNOSIS — I10 Essential (primary) hypertension: Secondary | ICD-10-CM

## 2018-01-06 DIAGNOSIS — E7800 Pure hypercholesterolemia, unspecified: Secondary | ICD-10-CM | POA: Diagnosis not present

## 2018-01-06 LAB — LIPID PANEL
Chol/HDL Ratio: 3.2 ratio (ref 0.0–4.4)
Cholesterol, Total: 168 mg/dL (ref 100–199)
HDL: 52 mg/dL (ref >39)
LDL Calculated: 94 mg/dL (ref 0–99)
Triglycerides: 110 mg/dL (ref 0–149)
VLDL Cholesterol Cal: 22 mg/dL (ref 5–40)

## 2018-01-06 LAB — HEPATIC FUNCTION PANEL
ALT: 24 IU/L (ref 0–32)
AST: 19 IU/L (ref 0–40)
Albumin: 4.2 g/dL (ref 3.5–4.8)
Alkaline Phosphatase: 87 IU/L (ref 39–117)
Bilirubin Total: 0.3 mg/dL (ref 0.0–1.2)
Bilirubin, Direct: 0.1 mg/dL (ref 0.00–0.40)
Total Protein: 7.2 g/dL (ref 6.0–8.5)

## 2018-01-09 NOTE — Progress Notes (Signed)
[name] [place], [place] [address] Phone: [phone] Subjective:     CC: Bilateral knee pain  [id number]  Amy Escobar is a 72 y.o. female coming in with complaint of bilateral knee pain.  Moderate to severe arthritic changes with instability noted.  Patient has responded fairly well to Visco supplementation previously but is worsening same pain again.  He did have steroid injections in the last 10 weeks.  Starting to have increasing swelling some increasing instability.  Rates the severity pain is 7 out of 10       Past Medical History:  Diagnosis Date  . Alkaline phosphatase deficiency    w/u Ne  . Allergic rhinitis   . Anxiety   . Arthritis   . DM2 (diabetes mellitus, type 2) ([place])   . GERD (gastroesophageal reflux disease)   . Gout   . Hemorrhoids   . Hyperlipidemia   . Hypertension   . Mild pulmonary hypertension ([place])   . NSVT (nonsustained ventricular tachycardia) ([place])   . Osteoporosis   . PVC's (premature ventricular contractions)   . Thyroid nodule    small  . Uterine fibroid    Past Surgical History:  Procedure Laterality Date  . CATARACT EXTRACTION Bilateral 2016  . COLONOSCOPY  2007  . echocardiogram (other)  01/16/2002  . 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 stress cardiolite  02/12/2006  . TOENAIL EXCISION    . TUBAL LIGATION     Social History   Socioeconomic History  . Marital status: Widowed    Spouse name: Not on file  . Number of children: 2  . Years of education: Not on file  . Highest education level: Not on file  Occupational History  . Occupation: Surveyor, minerals: UNEMPLOYED  Social Needs  . Financial resource strain: Not on file  . Food insecurity:    Worry: Not on file    Inability: Not on file  . Transportation needs:    Medical: Not on file    Non-medical: Not on file  Tobacco Use  . Smoking status: Former Smoker    Packs/day: 0.25    Years: 5.00   Pack years: 1.25    Types: Cigarettes    Last attempt to quit: 06/29/1968    Years since quitting: 49.5  . Smokeless tobacco: Never Used  Substance and Sexual Activity  . Alcohol use: No    Alcohol/week: 0.0 oz  . Drug use: No  . Sexual activity: Not on file  Lifestyle  . Physical activity:    Days per week: Not on file    Minutes per session: Not on file  . Stress: Not on file  Relationships  . Social connections:    Talks on phone: Not on file    Gets together: Not on file    Attends religious service: Not on file    Active member of club or organization: Not on file    Attends meetings of clubs or organizations: Not on file    Relationship status: Not on file  Other Topics Concern  . Not on file  Social History Narrative   Widowed 2010.    Allergies  Allergen Reactions  . Olmesartan Medoxomil     REACTION: headache   Family History  Problem Relation Age of Onset  . Heart disease Mother   . Heart disease Father   . Heart disease Maternal Grandfather   . Rectal cancer  Maternal Grandfather   . Stomach cancer Maternal Grandmother   . Colon cancer Neg Hx   . Breast cancer Neg Hx      Past medical history, social, surgical and family history all reviewed in electronic medical record.  No pertanent information unless stated regarding to the chief complaint.   Review of Systems:Review of systems updated and as accurate as of 01/10/18  No headache, visual changes, nausea, vomiting, diarrhea, constipation, dizziness, abdominal pain, skin rash, fevers, chills, night sweats, weight loss, swollen lymph nodes, body aches,  chest pain, shortness of breath, mood changes.  Positive muscle aches and joint swelling  Objective  Blood pressure 100/64, pulse 84, height 5' 4.5" (1.638 m), weight 160 lb (72.6 kg), SpO2 98 %. Systems examined below as of 01/10/18   General: No apparent distress alert and oriented x3 mood and affect normal, dressed appropriately.  HEENT: Pupils equal,  extraocular movements intact  Respiratory: Patient's speak in full sentences and does not appear short of breath  Cardiovascular: No lower extremity edema, non tender, no erythema  Skin: Warm dry intact with no signs of infection or rash on extremities or on axial skeleton.  Abdomen: Soft nontender  Neuro: Cranial nerves II through XII are intact, neurovascularly intact in all extremities with 2+ DTRs and 2+ pulses.  Lymph: No lymphadenopathy of posterior or anterior cervical chain or axillae bilaterally.  Gait mild antalgic MSK:  tender with full range of motion and good stability and symmetric strength and tone of shoulders, elbows, wrist, hip, and ankles bilaterally.  Mild to moderate arthritic changes  After informed written and verbal consent, patient was seated on exam table. Right knee was prepped with alcohol swab and utilizing anterolateral approach, patient's right knee space was injected with 22mg /Ml monovisc (sodium hyaluronate) in a prefilled syringe was injected easily into the knee through a 22-gauge needle..Patient tolerated the procedure well without immediate complications.  After informed written and verbal consent, patient was seated on exam table. Left knee was prepped with alcohol swab and utilizing anterolateral approach, patient's left knee space was injected with 22mg /mLof monovisc(sodium hyaluronate) in a prefilled syringe was injected easily into the knee through a 22-gauge needle..Patient tolerated the procedure well without immediate complications.   Impression and Recommendations:     This case required medical decision making of moderate complexity.      Note: This dictation was prepared with Dragon dictation along with smaller phrase technology. Any transcriptional errors that result from this process are unintentional.

## 2018-01-10 ENCOUNTER — Ambulatory Visit (INDEPENDENT_AMBULATORY_CARE_PROVIDER_SITE_OTHER): Payer: Medicare Other | Admitting: Cardiology

## 2018-01-10 ENCOUNTER — Encounter

## 2018-01-10 ENCOUNTER — Ambulatory Visit: Payer: Medicare Other | Admitting: Family Medicine

## 2018-01-10 ENCOUNTER — Encounter: Payer: Self-pay | Admitting: Cardiology

## 2018-01-10 ENCOUNTER — Encounter: Payer: Self-pay | Admitting: Family Medicine

## 2018-01-10 VITALS — BP 110/70 | HR 83 | Ht 64.5 in | Wt 159.8 lb

## 2018-01-10 DIAGNOSIS — R0609 Other forms of dyspnea: Secondary | ICD-10-CM

## 2018-01-10 DIAGNOSIS — M170 Bilateral primary osteoarthritis of knee: Secondary | ICD-10-CM

## 2018-01-10 DIAGNOSIS — I10 Essential (primary) hypertension: Secondary | ICD-10-CM

## 2018-01-10 DIAGNOSIS — E782 Mixed hyperlipidemia: Secondary | ICD-10-CM | POA: Diagnosis not present

## 2018-01-10 DIAGNOSIS — I272 Pulmonary hypertension, unspecified: Secondary | ICD-10-CM

## 2018-01-10 MED ORDER — AMLODIPINE BESYLATE 2.5 MG PO TABS: 2.5000 mg | ORAL_TABLET | Freq: Every day | ORAL | 2 refills | Status: DC

## 2018-01-10 NOTE — Patient Instructions (Addendum)
Good to see you  Monovisc given today  Hope it will kick in the next month  pennsaid pinkie amount topically 2 times daily as needed.   \See me again in 4 weeks if not doing a lot better

## 2018-01-10 NOTE — Patient Instructions (Signed)
Medication Instructions:   DECREASE YOUR AMLODIPINE TO 2.5 MG ONCE DAILY      Follow-Up:  Your physician wants you to follow-up in: [place] will receive a reminder letter in the mail two months in advance. If you don't receive a letter, please call our office to schedule the follow-up appointment.        If you need a refill on your cardiac medications before your next appointment, please call your pharmacy.

## 2018-01-10 NOTE — Progress Notes (Signed)
Patient ID: [name], female   DOB: 11/09/1945, 72 y.o.   MRN: [id number]    Patient Name: Amy Escobar Date of Encounter: 01/10/2018  Primary Care Provider:  Renato Shin, MD Primary Cardiologist:  Ena Dawley  Problem List   Past Medical History:  Diagnosis Date  . Alkaline phosphatase deficiency    w/u Ne  . Allergic rhinitis   . Anxiety   . Arthritis   . DM2 (diabetes mellitus, type 2) ([place])   . GERD (gastroesophageal reflux disease)   . Gout   . Hemorrhoids   . Hyperlipidemia   . Hypertension   . Mild pulmonary hypertension ([place])   . NSVT (nonsustained ventricular tachycardia) ([place])   . Osteoporosis   . PVC's (premature ventricular contractions)   . Thyroid nodule    small  . Uterine fibroid    Past Surgical History:  Procedure Laterality Date  . CATARACT EXTRACTION Bilateral 2016  . COLONOSCOPY  2007  . echocardiogram (other)  01/16/2002  . 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 stress cardiolite  02/12/2006  . TOENAIL EXCISION    . TUBAL LIGATION     Allergies  Allergies  Allergen Reactions  . Olmesartan Medoxomil     REACTION: headache   HPI  72 year old with hypertension, hyperlipidemia, and known insulin-dependent diabetes mellitus with dyspnea on exertion for which she underwent following tests:   GXT:  Has had DOE - referred here for GXT and also referred to pulmonary. States she is doing better. Trying to lose weight. For PFTs later this month. Today the patient exercised on the standard Bruce protocol for a total of 2:41 minutes.  Poor exercise tolerance.  Adequate blood pressure response.  Clinically negative for chest pain. Test was stopped due to dyspnea; PVCs and over a minute run of bigeminy PVCs..  EKG negative for ischemia. No significant arrhythmia noted.   Lexiscan myoview - negative for scar or ischemia 24 Holter - episodes of nonsustained VT the longest lasting 5 beats for which she underwent Lexiscan  Myoview.  Referred for a cardiology consult. The patient denies any palpitations, chest pain, lower extremity edema, orthopnea, paroxysmal nocturnal dyspnea or prior syncope. She states that in the past she used to be quite active walking for about a mile every day but she stopped in November since then she felt quite short of breath.  She is coming after 2 months, normal stress test, echo showed mild MR, TR, RVSP 47 mmHg, she was started on 2.5 mg of amlodipine and feels significantly better. She exercises 5 x week and feels great. Mild dyspnea on moderate exertion.   06/16/16 - 1 year follow up, the patient feels great, she is very active and not limited in her activities of daily living. She denies CP< has stable DOE, no palpitations, dizziness or syncope.  She has been complaint with her meds and has no side effects. She continues to have knee pains. Muscle pain with simvastatin has resolved.  06/28/2017 - 1 year follow up, the patient is doing well, she is active and denies any chest pain or SOB, she has no palpitations, claudications, no dizziness or syncope. She is tolerating her meds well.  January 10, 2018 - this is a 6 months follow-up, patient feels and looks great, she does not exercise but stays very active helping in her community.  She denies any chest pain or shortness of breath, feels tired most of the time.  Her blood pressure has been  running from 90s to 110s.  She has mild dizziness only in the mornings, no syncope. Her muscle pain has improved significantly when switched from Lipitor to Crestor.  She recently had blood work done with all of the labs normal and excellent lipids.  She denies any lower extremity edema orthopnea proximal nocturnal dyspnea.  Home Medications  Prior to Admission medications   Medication Sig Start Date End Date Taking? Authorizing Provider  Blood Glucose Monitoring Suppl (ONE TOUCH ULTRA MINI) W/DEVICE KIT 1 Device by Does not apply route once. 11/25/12   Yes Renato Shin, MD  gabapentin (NEURONTIN) 300 MG capsule One three times a day 09/12/13  Yes Tanda Rockers, MD  naproxen (NAPROSYN) 500 MG tablet Take 1 tablet (500 mg total) by mouth 2 (two) times daily with a meal. 08/10/12  Yes Renato Shin, MD  NEXIUM 40 MG capsule TAKE 1 CAPSULE (40 MG TOTAL) BY MOUTH DAILY BEFORE BREAKFAST.   Yes Renato Shin, MD  ONE TOUCH ULTRA TEST test strip TEST BLOOD SUGAR THREE(3) TIMES DAILY 06/24/13  Yes Renato Shin, MD  [facility] DELICA LANCETS 16X [place] USE AS DIRECTED BY PRESCRIBER THREE TIMES A DAY   Yes Renato Shin, MD  simvastatin (ZOCOR) 80 MG tablet TAKE 1 TABLET BY MOUTH EVERY NIGHT AT BEDTIME   Yes Renato Shin, MD  valsartan-hydrochlorothiazide (DIOVAN-HCT) 160-12.5 MG per tablet TAKE 1 TABLET BY MOUTH DAILY 07/29/13  Yes Renato Shin, MD    Family History  Family History  Problem Relation Age of Onset  . Heart disease Mother   . Heart disease Father   . Heart disease Maternal Grandfather   . Rectal cancer Maternal Grandfather   . Stomach cancer Maternal Grandmother   . Colon cancer Neg Hx   . Breast cancer Neg Hx     Social History  Social History   Socioeconomic History  . Marital status: Widowed    Spouse name: Not on file  . Number of children: 2  . Years of education: Not on file  . Highest education level: Not on file  Occupational History  . Occupation: Surveyor, minerals: UNEMPLOYED  Social Needs  . Financial resource strain: Not on file  . Food insecurity:    Worry: Not on file    Inability: Not on file  . Transportation needs:    Medical: Not on file    Non-medical: Not on file  Tobacco Use  . Smoking status: Former Smoker    Packs/day: 0.25    Years: 5.00    Pack years: 1.25    Types: Cigarettes    Last attempt to quit: 06/29/1968    Years since quitting: 49.5  . Smokeless tobacco: Never Used  Substance and Sexual Activity  . Alcohol use: No    Alcohol/week: 0.0 oz  . Drug use: No  . Sexual activity: Not  on file  Lifestyle  . Physical activity:    Days per week: Not on file    Minutes per session: Not on file  . Stress: Not on file  Relationships  . Social connections:    Talks on phone: Not on file    Gets together: Not on file    Attends religious service: Not on file    Active member of club or organization: Not on file    Attends meetings of clubs or organizations: Not on file    Relationship status: Not on file  . Intimate partner violence:    Fear of current  or ex partner: Not on file    Emotionally abused: Not on file    Physically abused: Not on file    Forced sexual activity: Not on file  Other Topics Concern  . Not on file  Social History Narrative   Widowed 2010.     Review of Systems, as per HPI, otherwise negative General:  No chills, fever, night sweats or weight changes.  Cardiovascular:  No chest pain, dyspnea on exertion, edema, orthopnea, palpitations, paroxysmal nocturnal dyspnea. Dermatological: No rash, lesions/masses Respiratory: No cough, dyspnea Urologic: No hematuria, dysuria Abdominal:   No nausea, vomiting, diarrhea, bright red blood per rectum, melena, or hematemesis Neurologic:  No visual changes, wkns, changes in mental status. All other systems reviewed and are otherwise negative except as noted above.  Physical Exam  Height 5' 4.5" (1.638 m).  General: Pleasant, NAD Psych: Normal affect. Neuro: Alert and oriented X 3. Moves all extremities spontaneously. HEENT: Normal  Neck: Supple without bruits or JVD. Lungs:  Resp regular and unlabored, CTA. Heart: RRR no s3, s4, or murmurs. Abdomen: Soft, non-tender, non-distended, BS + x 4.  Extremities: No clubbing, cyanosis or edema. DP/PT/Radials 2+ and equal bilaterally.  Labs:  No results for input(s): CKTOTAL, CKMB, TROPONINI in the last 72 hours. Lab Results  Component Value Date   WBC WILL FOLLOW 12/18/2016   HGB WILL FOLLOW 12/18/2016   HCT WILL FOLLOW 12/18/2016   MCV WILL FOLLOW  12/18/2016   PLT WILL FOLLOW 12/18/2016    Lab Results  Component Value Date   DDIMER <0.27 02/15/2015   Invalid input(s): POCBNP    Component Value Date/Time   NA 139 04/07/2017 0805   K 3.9 04/07/2017 0805   CL 100 04/07/2017 0805   CO2 23 04/07/2017 0805   GLUCOSE 99 04/07/2017 0805   GLUCOSE 110 (H) 08/12/2016 0818   BUN 14 04/07/2017 0805   CREATININE 0.90 04/07/2017 0805   CREATININE 0.73 06/08/2016 0801   CALCIUM 10.1 08/12/2017 0831   PROT 7.2 01/06/2018 0743   ALBUMIN 4.2 01/06/2018 0743   AST 19 01/06/2018 0743   ALT 24 01/06/2018 0743   ALKPHOS 87 01/06/2018 0743   BILITOT 0.3 01/06/2018 0743   GFRNONAA 65 04/07/2017 0805   GFRAA 74 04/07/2017 0805   Lab Results  Component Value Date   CHOL 168 01/06/2018   HDL 52 01/06/2018   LDLCALC 94 01/06/2018   TRIG 110 01/06/2018   Accessory Clinical Findings  Echocardiogram - 09/20/2013 - Left ventricle: The cavity size was normal. Wall thickness was normal. Systolic function was low normal to mildly reduced. The estimated ejection fraction was in the range of 50% to 55%. Wall motion was normal; there were no regional wall motion abnormalities. Doppler parameters are consistent with abnormal left ventricular relaxation (grade 1 diastolic dysfunction). - Aortic valve: There was no stenosis. - Mitral valve: Mildly calcified annulus. Normal thickness leaflets . Mild regurgitation. - Left atrium: The atrium was mildly dilated. - Right ventricle: The cavity size was normal. Systolic function was normal. - Tricuspid valve: Peak RV-RA gradient: 66m Hg (S). - Pulmonary arteries: PA peak pressure: 492mHg (S). - Systemic veins: IVC not visualized. Impressions:  - Normal LV size with low normal to mildly reduced systolic function, EF 5067-12%Normal RV size and systolic function. Mild MR. Mild pulmonary hypertension.  04/10/2014 Left ventricle: The cavity size was normal. Wall thickness was increased in a  pattern of mild LVH. Systolic function was normal. The estimated ejection fraction  was in the range of 55% to 60%. Wall motion was normal; there were no regional wall motion abnormalities. Doppler parameters are consistent with abnormal left ventricular relaxation (grade 1 diastolic dysfunction). - Pulmonary arteries: Systolic pressure was mildly increased. PA peak pressure: 36 mm Hg (S).  Impressions:  - Normal LV function; mild LVH; grade 1 diastolic dysfunction; mild TR; mildly elevated pulmonary pressure. Compared to 09/20/13, LV function remains normal.   Lexiscan Myoview: 09/21/2013 Impression  Exercise Capacity: Lexiscan with low level exercise.  BP Response: Normal blood pressure response.  Clinical Symptoms: No symptoms.  ECG Impression: There are scattered PVCs.  Comparison with Prior Nuclear Study: No previous nuclear study performed  Overall Impression: Low risk stress nuclear study with apical thinning defect and small area of inferolateral bowel artifact.  LV Ejection Fraction: 53%. LV Wall Motion: NL LV Function; NL Wall Motion  ECG - performed on 06/28/2017 showed sinus rhythm, normal EKG. Unchanged from prior, personally reviewed.    Assessment & Plan  1.DOE - Echocardiogram showed preserved LV ejection fraction, grade 1 diastolic dysfunction and mild pulmonary hypertension with PA pressure 47 mmHg - she was started on amlodipine with significant symptoms improvement and decrease of her RVSP to 36 mmHg. Dyspnea on exertion- due to pulmonary hypertension, normal stress test.  Stable, she is now rather hypotensive and slightly dizzy and tired, I will decrease amlodipine dose to 2.5 mg daily.  EKG completely normal today.  2. Muscle pain with statins, borderline elevated LFTs with simvastatin, now normal LFTs and lipids at goal on rosuvastatin 5 mg po daily.  She is tolerating it very well.  3. Hypertension -decreasing amlodipine to 2.5 mg daily as  above.  4. Hyperlipidemia - lipids at goal on rosuvastatin 5 mg po daily.  Normal LFTs.  Follow up in 6 months.   Ena Dawley, MD, [facility] 01/10/2018, 8:44 AM

## 2018-01-10 NOTE — Assessment & Plan Note (Signed)
Viscosupplementation given today after failing all conservative therapy again.  Discussed icing regimen and home exercises.  Discussed which activities of doing which wants to avoid.  Increase activity as tolerated.  Follow-up again in 4 to 8 weeks

## 2018-01-17 ENCOUNTER — Other Ambulatory Visit: Payer: Self-pay | Admitting: Family Medicine

## 2018-01-17 NOTE — Telephone Encounter (Signed)
Refill done.  

## 2018-02-04 NOTE — Progress Notes (Signed)
[name] [address], [place] [address] Phone: [phone] Subjective:     CC: Bilateral knee pain, left shoulder pain  VPX:[id number]  Amy Escobar is a 72 y.o. female coming in with complaint of bilateral knee pain. Knees have been painful for the past 2 weeks.  Has known degenerative arthritis.  Was given Monovisc 1 month ago.  Left shoulder pain. Has had issues with rotator cuff in the past. Shoulder pops.   Onset- a few months  Location- posterior shoulder into the neck     Past Medical History:  Diagnosis Date  . Alkaline phosphatase deficiency    w/u Ne  . Allergic rhinitis   . Anxiety   . Arthritis   . DM2 (diabetes mellitus, type 2) ([place])   . GERD (gastroesophageal reflux disease)   . Gout   . Hemorrhoids   . Hyperlipidemia   . Hypertension   . Mild pulmonary hypertension ([place])   . NSVT (nonsustained ventricular tachycardia) ([place])   . Osteoporosis   . PVC's (premature ventricular contractions)   . Thyroid nodule    small  . Uterine fibroid    Past Surgical History:  Procedure Laterality Date  . CATARACT EXTRACTION Bilateral 2016  . COLONOSCOPY  2007  . echocardiogram (other)  01/16/2002  . 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 stress cardiolite  02/12/2006  . TOENAIL EXCISION    . TUBAL LIGATION     Social History   Socioeconomic History  . Marital status: Widowed    Spouse name: Not on file  . Number of children: 2  . Years of education: Not on file  . Highest education level: Not on file  Occupational History  . Occupation: Surveyor, minerals: UNEMPLOYED  Social Needs  . Financial resource strain: Not on file  . Food insecurity:    Worry: Not on file    Inability: Not on file  . Transportation needs:    Medical: Not on file    Non-medical: Not on file  Tobacco Use  . Smoking status: Former Smoker    Packs/day: 0.25    Years: 5.00    Pack years: 1.25    Types: Cigarettes    Last  attempt to quit: 06/29/1968    Years since quitting: 49.6  . Smokeless tobacco: Never Used  Substance and Sexual Activity  . Alcohol use: No    Alcohol/week: 0.0 standard drinks  . Drug use: No  . Sexual activity: Not on file  Lifestyle  . Physical activity:    Days per week: Not on file    Minutes per session: Not on file  . Stress: Not on file  Relationships  . Social connections:    Talks on phone: Not on file    Gets together: Not on file    Attends religious service: Not on file    Active member of club or organization: Not on file    Attends meetings of clubs or organizations: Not on file    Relationship status: Not on file  Other Topics Concern  . Not on file  Social History Narrative   Widowed 2010.    Allergies  Allergen Reactions  . Olmesartan Medoxomil     REACTION: headache   Family History  Problem Relation Age of Onset  . Heart disease Mother   . Heart disease Father   . Heart disease Maternal Grandfather   . Rectal cancer Maternal Grandfather   .  Stomach cancer Maternal Grandmother   . Colon cancer Neg Hx   . Breast cancer Neg Hx      Past medical history, social, surgical and family history all reviewed in electronic medical record.  No pertanent information unless stated regarding to the chief complaint.   Review of Systems:Review of systems updated and as accurate as of 02/07/18  No headache, visual changes, nausea, vomiting, diarrhea, constipation, dizziness, abdominal pain, skin rash, fevers, chills, night sweats, weight loss, swollen lymph nodes, body aches, joint swelling, muscle aches, chest pain, shortness of breath, mood changes.   Objective  Blood pressure 104/70, pulse 81, height 5' 4.5" (1.638 m), weight 162 lb (73.5 kg), SpO2 97 %. Systems examined below as of 02/07/18   General: No apparent distress alert and oriented x3 mood and affect normal, dressed appropriately.  HEENT: Pupils equal, extraocular movements intact  Respiratory:  Patient's speak in full sentences and does not appear short of breath  Cardiovascular: No lower extremity edema, non tender, no erythema  Skin: Warm dry intact with no signs of infection or rash on extremities or on axial skeleton.  Abdomen: Soft nontender  Neuro: Cranial nerves II through XII are intact, neurovascularly intact in all extremities with 2+ DTRs and 2+ pulses.  Lymph: No lymphadenopathy of posterior or anterior cervical chain or axillae bilaterally.  Gait normal with good balance and coordination.  MSK:  Non tender with full range of motion and good stability and symmetric strength and tone of  elbows, wrist, hip, and ankles bilaterally.  Mild arthritic changes of multiple joints Knee: Bilateral valgus deformity noted. Abnormal thigh to calf ratio.  Tender to palpation over medial and PF joint line.  ROM full in flexion and extension and lower leg rotation. instability with valgus force.  painful patellar compression. Patellar glide with moderate crepitus. Patellar and quadriceps tendons unremarkable. Hamstring and quadriceps strength is normal.  Neck: Inspection loss of lordosis. No palpable stepoffs. Negative Spurling's maneuver. Limited range of motion lacking the last 5 to 10 degrees Grip strength and sensation normal in bilateral hands Strength good C4 to T1 distribution No sensory change to C4 to T1 Negative Hoffman sign bilaterally Reflexes normal Tightness of the trapezius bilaterally  After verbal consent patient was prepped with alcohol swabs and with a 25-gauge half inch needle injected and 4 distinct trigger points.  Total of 4 cc of 0.5% Marcaine and 1 cc of Kenalog 40 mg/mL    Impression and Recommendations:     This case required medical decision making of moderate complexity.      Note: This dictation was prepared with Dragon dictation along with smaller phrase technology. Any transcriptional errors that result from this process are  unintentional.

## 2018-02-07 ENCOUNTER — Ambulatory Visit: Payer: Medicare Other | Admitting: Family Medicine

## 2018-02-07 ENCOUNTER — Encounter: Payer: Self-pay | Admitting: Family Medicine

## 2018-02-07 DIAGNOSIS — M25512 Pain in left shoulder: Secondary | ICD-10-CM

## 2018-02-07 DIAGNOSIS — M170 Bilateral primary osteoarthritis of knee: Secondary | ICD-10-CM | POA: Diagnosis not present

## 2018-02-07 NOTE — Assessment & Plan Note (Addendum)
No improvement.  Discussed icing regimen and home exercise.  Which activities to do which wants to avoid.  Follow-up again in 4 to 6 weeks

## 2018-02-07 NOTE — Assessment & Plan Note (Signed)
Patient given injection.  Patient will be topical anti-inflammatories.  Discussed ergonomics.  Follow-up again in 4 weeks

## 2018-02-07 NOTE — Patient Instructions (Signed)
Good to see you  Amy Escobar is your friend.  We tried some trigger point injections for the muscles around the shoulder Stay active but keep hands within your peripheral vision  Lets give the knees a little more time See me again in 4 weeks

## 2018-02-09 ENCOUNTER — Ambulatory Visit: Payer: Medicare Other | Admitting: Endocrinology

## 2018-02-10 ENCOUNTER — Ambulatory Visit (INDEPENDENT_AMBULATORY_CARE_PROVIDER_SITE_OTHER): Payer: Medicare Other | Admitting: Endocrinology

## 2018-02-10 ENCOUNTER — Encounter: Payer: Self-pay | Admitting: Endocrinology

## 2018-02-10 VITALS — BP 100/68 | HR 75 | Temp 97.9°F | Ht 64.5 in | Wt 160.4 lb

## 2018-02-10 DIAGNOSIS — E118 Type 2 diabetes mellitus with unspecified complications: Secondary | ICD-10-CM

## 2018-02-10 LAB — POCT GLYCOSYLATED HEMOGLOBIN (HGB A1C): Hemoglobin A1C: 5.6 % (ref 4.0–5.6)

## 2018-02-10 MED ORDER — METFORMIN HCL ER 500 MG PO TB24: 500.0000 mg | ORAL_TABLET | Freq: Every day | ORAL | 3 refills | Status: DC

## 2018-02-10 MED ORDER — CEPHALEXIN 500 MG PO CAPS: 500.0000 mg | ORAL_CAPSULE | Freq: Three times a day (TID) | ORAL | 0 refills | Status: DC

## 2018-02-10 NOTE — Patient Instructions (Addendum)
Please continue the same metformin.   I have sent a prescription to your pharmacy, for an antibiotic pill.  I hope you feel better soon.  If you don't feel better by next week, please call back.  Please call sooner if you get worse.

## 2018-02-10 NOTE — Progress Notes (Signed)
Subjective:    Patient ID: Amy Escobar, female    DOB: [birth year], 72 y.o.   MRN: [id number]  HPI Pt states few weeks of moderate prod-quality cough in the chest, and assoc bilat earache.   Past Medical History:  Diagnosis Date  . Alkaline phosphatase deficiency    w/u Ne  . Allergic rhinitis   . Anxiety   . Arthritis   . DM2 (diabetes mellitus, type 2) ([place])   . GERD (gastroesophageal reflux disease)   . Gout   . Hemorrhoids   . Hyperlipidemia   . Hypertension   . Mild pulmonary hypertension ([place])   . NSVT (nonsustained ventricular tachycardia) ([place])   . Osteoporosis   . PVC's (premature ventricular contractions)   . Thyroid nodule    small  . Uterine fibroid     Past Surgical History:  Procedure Laterality Date  . CATARACT EXTRACTION Bilateral 2016  . COLONOSCOPY  2007  . echocardiogram (other)  01/16/2002  . 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 stress cardiolite  02/12/2006  . TOENAIL EXCISION    . TUBAL LIGATION      Social History   Socioeconomic History  . Marital status: Widowed    Spouse name: Not on file  . Number of children: 2  . Years of education: Not on file  . Highest education level: Not on file  Occupational History  . Occupation: Surveyor, minerals: UNEMPLOYED  Social Needs  . Financial resource strain: Not on file  . Food insecurity:    Worry: Not on file    Inability: Not on file  . Transportation needs:    Medical: Not on file    Non-medical: Not on file  Tobacco Use  . Smoking status: Former Smoker    Packs/day: 0.25    Years: 5.00    Pack years: 1.25    Types: Cigarettes    Last attempt to quit: 06/29/1968    Years since quitting: 49.6  . Smokeless tobacco: Never Used  Substance and Sexual Activity  . Alcohol use: No    Alcohol/week: 0.0 standard drinks  . Drug use: No  . Sexual activity: Not on file  Lifestyle  . Physical activity:    Days per week: Not on file    Minutes per session: Not on file  .  Stress: Not on file  Relationships  . Social connections:    Talks on phone: Not on file    Gets together: Not on file    Attends religious service: Not on file    Active member of club or organization: Not on file    Attends meetings of clubs or organizations: Not on file    Relationship status: Not on file  . Intimate partner violence:    Fear of current or ex partner: Not on file    Emotionally abused: Not on file    Physically abused: Not on file    Forced sexual activity: Not on file  Other Topics Concern  . Not on file  Social History Narrative   Widowed 2010.     Current Outpatient Medications on File Prior to Visit  Medication Sig Dispense Refill  . amLODipine (NORVASC) 2.5 MG tablet Take 1 tablet (2.5 mg total) by mouth daily. 90 tablet 2  . aspirin EC 81 MG tablet Take 81 mg by mouth daily.    . Blood Glucose Monitoring Suppl (ONE TOUCH ULTRA MINI) w/Device KIT 1 Device by Does  not apply route as needed. 1 each 0  . esomeprazole (NEXIUM) 40 MG capsule TAKE ONE CAPSULE BY MOUTH DAILY BEFORE BREAKFAST 90 capsule 1  . fesoterodine (TOVIAZ) 4 MG TB24 tablet Take 4 mg daily by mouth.    . gabapentin (NEURONTIN) 600 MG tablet TAKE ONE TABLET BY MOUTH THREE TIMES A DAY 270 tablet 8  . glucose blood (ONE TOUCH ULTRA TEST) test strip 1 each by Other route daily. And lancets 1/day 100 each 2  . loratadine-pseudoephedrine (CLARITIN-D 12-HOUR) 5-120 MG tablet Take 1 tablet by mouth daily. 30 tablet 11  . promethazine-dextromethorphan (PROMETHAZINE-DM) 6.25-15 MG/5ML syrup Take 2.5 mLs 4 (four) times daily as needed by mouth for cough. 118 mL 1  . rosuvastatin (CRESTOR) 5 MG tablet TAKE ONE TABLET BY MOUTH DAILY 30 tablet 11  . triamcinolone cream (KENALOG) 0.1 % APPLY 1 APPLICATION TOPICALLY 4 (FOUR) TIMES DAILY AS NEEDED FOR RASH 45 g 1  . UNKNOWN TO PATIENT Inject as directed every 6 (six) months. 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 valsartan-hydrochlorothiazide  (DIOVAN-HCT) 160-12.5 MG tablet TAKE ONE TABLET BY MOUTH DAILY 90 tablet 1  . Vitamin D, Ergocalciferol, (DRISDOL) 50000 units CAPS capsule TAKE ONE CAPSULE BY MOUTH ONCE WEEKLY 12 capsule 0   No current facility-administered medications on file prior to visit.     Allergies  Allergen Reactions  . Olmesartan Medoxomil     REACTION: headache    Family History  Problem Relation Age of Onset  . Heart disease Mother   . Heart disease Father   . Heart disease Maternal Grandfather   . Rectal cancer Maternal Grandfather   . Stomach cancer Maternal Grandmother   . Colon cancer Neg Hx   . Breast cancer Neg Hx     BP 100/68 (BP Location: Right Arm, Patient Position: Sitting, Cuff Size: Normal)   Pulse 75   Temp 97.9 F (36.6 C) (Oral)   Ht 5' 4.5" (1.638 m)   Wt 160 lb 6.4 oz (72.8 kg)   SpO2 98%   BMI 27.11 kg/m    Review of Systems Denies fever and sob.      Objective:   Physical Exam VITAL SIGNS:  See vs page.   GENERAL: no distress.  Left TNM is very red.  Right eac is occluded with cerumen.  LUNGS:  Clear to auscultation.    Lab Results  Component Value Date   HGBA1C 5.6 02/10/2018       Assessment & Plan:  AOM: new type 2 DM: well-controlled   Patient Instructions  Please continue the same metformin.   I have sent a prescription to your pharmacy, for an antibiotic pill.  I hope you feel better soon.  If you don't feel better by next week, please call back.  Please call sooner if you get worse.

## 2018-02-21 ENCOUNTER — Other Ambulatory Visit: Payer: Self-pay | Admitting: Endocrinology

## 2018-02-26 ENCOUNTER — Other Ambulatory Visit: Payer: Self-pay | Admitting: Endocrinology

## 2018-03-06 NOTE — Progress Notes (Signed)
[name] [place], [place] [address] Phone: [phone] Subjective:    I [name] am serving as a Education administrator for Dr. Hulan Saas.    CC: Patient is noticing more pain in the neck and left shoulder  BSW:[id number]  Amy Escobar is a 72 y.o. female coming in with complaint of left shoulder pain. States she is hurting all over today. Back is painful.  States it seems to be between the shoulder blades and radiates up to her neck.  Symptoms associated with a headache.  It seems to be worsening.  Trigger point injections did not help significantly.  Patient also found to have severe arthritis of the knees bilaterally.  In 2 months ago given Visco supplementation.  Feels like it has not been beneficial.  Worsening pain again.  Rates the severity pain is 7 out of 10     Past Medical History:  Diagnosis Date  . Alkaline phosphatase deficiency    w/u Ne  . Allergic rhinitis   . Anxiety   . Arthritis   . DM2 (diabetes mellitus, type 2) ([place])   . GERD (gastroesophageal reflux disease)   . Gout   . Hemorrhoids   . Hyperlipidemia   . Hypertension   . Mild pulmonary hypertension ([place])   . NSVT (nonsustained ventricular tachycardia) ([place])   . Osteoporosis   . PVC's (premature ventricular contractions)   . Thyroid nodule    small  . Uterine fibroid    Past Surgical History:  Procedure Laterality Date  . CATARACT EXTRACTION Bilateral 2016  . COLONOSCOPY  2007  . echocardiogram (other)  01/16/2002  . 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 stress cardiolite  02/12/2006  . TOENAIL EXCISION    . TUBAL LIGATION     Social History   Socioeconomic History  . Marital status: Widowed    Spouse name: Not on file  . Number of children: 2  . Years of education: Not on file  . Highest education level: Not on file  Occupational History  . Occupation: Surveyor, minerals: UNEMPLOYED  Social Needs  . Financial resource strain:  Not on file  . Food insecurity:    Worry: Not on file    Inability: Not on file  . Transportation needs:    Medical: Not on file    Non-medical: Not on file  Tobacco Use  . Smoking status: Former Smoker    Packs/day: 0.25    Years: 5.00    Pack years: 1.25    Types: Cigarettes    Last attempt to quit: 06/29/1968    Years since quitting: 49.7  . Smokeless tobacco: Never Used  Substance and Sexual Activity  . Alcohol use: No    Alcohol/week: 0.0 standard drinks  . Drug use: No  . Sexual activity: Not on file  Lifestyle  . Physical activity:    Days per week: Not on file    Minutes per session: Not on file  . Stress: Not on file  Relationships  . Social connections:    Talks on phone: Not on file    Gets together: Not on file    Attends religious service: Not on file    Active member of club or organization: Not on file    Attends meetings of clubs or organizations: Not on file    Relationship status: Not on file  Other Topics Concern  . Not on file  Social  History Narrative   Widowed 2010.    Allergies  Allergen Reactions  . Olmesartan Medoxomil     REACTION: headache   Family History  Problem Relation Age of Onset  . Heart disease Mother   . Heart disease Father   . Heart disease Maternal Grandfather   . Rectal cancer Maternal Grandfather   . Stomach cancer Maternal Grandmother   . Colon cancer Neg Hx   . Breast cancer Neg Hx     Current Outpatient Medications (Endocrine & Metabolic):  .  metFORMIN (GLUCOPHAGE-XR) 500 MG 24 hr tablet, Take 1 tablet (500 mg total) by mouth daily.  Current Outpatient Medications (Cardiovascular):  .  amLODipine (NORVASC) 2.5 MG tablet, Take 1 tablet (2.5 mg total) by mouth daily. .  rosuvastatin (CRESTOR) 5 MG tablet, TAKE ONE TABLET BY MOUTH DAILY .  valsartan-hydrochlorothiazide (DIOVAN-HCT) 160-12.5 MG tablet, TAKE ONE TABLET BY MOUTH DAILY  Current Outpatient Medications (Respiratory):  .  loratadine-pseudoephedrine  (CLARITIN-D 12-HOUR) 5-120 MG tablet, Take 1 tablet by mouth daily. .  promethazine-dextromethorphan (PROMETHAZINE-DM) 6.25-15 MG/5ML syrup, Take 2.5 mLs 4 (four) times daily as needed by mouth for cough.  Current Outpatient Medications (Analgesics):  .  aspirin EC 81 MG tablet, Take 81 mg by mouth daily.   Current Outpatient Medications (Other):  .  Blood Glucose Monitoring Suppl (ONE TOUCH ULTRA MINI) w/Device KIT, 1 Device by Does not apply route as needed. .  cephALEXin (KEFLEX) 500 MG capsule, Take 1 capsule (500 mg total) by mouth 3 (three) times daily. Marland Kitchen  esomeprazole (NEXIUM) 40 MG capsule, TAKE ONE CAPSULE BY MOUTH DAILY BEFORE BREAKFAST .  fesoterodine (TOVIAZ) 4 MG TB24 tablet, Take 4 mg daily by mouth. .  gabapentin (NEURONTIN) 600 MG tablet, TAKE ONE TABLET BY MOUTH THREE TIMES A DAY .  ONE TOUCH ULTRA TEST test strip, USE STRIP TO CHECK BLOOD SUGAR DAILY .  triamcinolone cream (KENALOG) 0.1 %, APPLY 1 APPLICATION TOPICALLY 4 (FOUR) TIMES DAILY AS NEEDED FOR RASH .  UNKNOWN TO PATIENT, Inject as directed every 6 (six) months. 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  Vitamin D, Ergocalciferol, (DRISDOL) 50000 units CAPS capsule, TAKE ONE CAPSULE BY MOUTH ONCE WEEKLY    Past medical history, social, surgical and family history all reviewed in electronic medical record.  No pertanent information unless stated regarding to the chief complaint.   Review of Systems:  No headache, visual changes, nausea, vomiting, diarrhea, constipation, dizziness, abdominal pain, skin rash, fevers, chills, night sweats, weight loss, swollen lymph nodes, , chest pain, shortness of breath, mood changes.  Positive muscle aches, body aches, joint swelling  Objective  Blood pressure 120/60, pulse 79, height 5' 4.5" (1.638 m), weight 160 lb (72.6 kg), SpO2 98 %.    General: No apparent distress alert and oriented x3 mood and affect normal, dressed appropriately.  HEENT: Pupils equal,  extraocular movements intact  Respiratory: Patient's speak in full sentences and does not appear short of breath  Cardiovascular: No lower extremity edema, non tender, no erythema  Skin: Warm dry intact with no signs of infection or rash on extremities or on axial skeleton.  Abdomen: Soft nontender  Neuro: Cranial nerves II through XII are intact, neurovascularly intact in all extremities with 2+ DTRs and 2+ pulses.  Lymph: No lymphadenopathy of posterior or anterior cervical chain or axillae bilaterally.  Gait normal with good balance and coordination.  MSK:  Non tender with full range of motion and good stability and symmetric strength  and tone of shoulders, elbows, wrist, hip, and ankles bilaterally.  Knee: Bilateral valgus deformity noted.  Abnormal thigh to calf ratio.  Tender to palpation over medial and PF joint line.  ROM full in flexion and extension and lower leg rotation. instability with valgus force.  painful patellar compression. Patellar glide with moderate crepitus. Patellar and quadriceps tendons unremarkable. Hamstring and quadriceps strength is normal.  Neck: Inspection mild loss of lordosis. No palpable stepoffs. Negative Spurling's maneuver. Mild limited range of motion in sidebending and extension by 5 degrees Grip strength and sensation normal in bilateral hands Strength good C4 to T1 distribution No sensory change to C4 to T1 Negative Hoffman sign bilaterally Reflexes normal Severe trigger points noted in the left and right trapezius muscles  After informed written and verbal consent, patient was seated on exam table. Right knee was prepped with alcohol swab and utilizing anterolateral approach, patient's right knee space was injected with 4:1  marcaine 0.5%: Kenalog 22m/dL. Patient tolerated the procedure well without immediate complications.  After informed written and verbal consent, patient was seated on exam table. Left knee was prepped with alcohol swab  and utilizing anterolateral approach, patient's left knee space was injected with 4:1  marcaine 0.5%: Kenalog 48mdL. Patient tolerated the procedure well without immediate complications.    Impression and Recommendations:     This case required medical decision making of moderate complexity. The above documentation has been reviewed and is accurate and complete ZaLyndal PulleyDO       Note: This dictation was prepared with Dragon dictation along with smaller phrase technology. Any transcriptional errors that result from this process are unintentional.

## 2018-03-08 ENCOUNTER — Encounter: Payer: Self-pay | Admitting: Family Medicine

## 2018-03-08 ENCOUNTER — Ambulatory Visit: Payer: Self-pay

## 2018-03-08 ENCOUNTER — Ambulatory Visit (INDEPENDENT_AMBULATORY_CARE_PROVIDER_SITE_OTHER)
Admission: RE | Admit: 2018-03-08 | Discharge: 2018-03-08 | Disposition: A | Discharge status: Home / Self Care | Payer: Medicare Other | Source: Ambulatory Visit | Attending: Family Medicine | Admitting: Family Medicine

## 2018-03-08 ENCOUNTER — Ambulatory Visit: Payer: Medicare Other | Admitting: Family Medicine

## 2018-03-08 VITALS — BP 120/60 | HR 79 | Ht 64.5 in | Wt 160.0 lb

## 2018-03-08 DIAGNOSIS — M25512 Pain in left shoulder: Secondary | ICD-10-CM | POA: Diagnosis not present

## 2018-03-08 DIAGNOSIS — M542 Cervicalgia: Secondary | ICD-10-CM | POA: Diagnosis not present

## 2018-03-08 DIAGNOSIS — G4489 Other headache syndrome: Secondary | ICD-10-CM

## 2018-03-08 DIAGNOSIS — G8929 Other chronic pain: Secondary | ICD-10-CM | POA: Diagnosis not present

## 2018-03-08 DIAGNOSIS — M549 Dorsalgia, unspecified: Secondary | ICD-10-CM

## 2018-03-08 DIAGNOSIS — M4184 Other forms of scoliosis, thoracic region: Secondary | ICD-10-CM | POA: Diagnosis not present

## 2018-03-08 DIAGNOSIS — M170 Bilateral primary osteoarthritis of knee: Secondary | ICD-10-CM

## 2018-03-08 NOTE — Assessment & Plan Note (Signed)
Likely from the muscle tightness in the neck.  We will treat the neck and see if this will help the headache

## 2018-03-08 NOTE — Assessment & Plan Note (Signed)
Worsening symptoms again.  Patient is declined any surgical intervention.  Discussed icing regimen in which wants to doing which wants to avoid.  Discussed icing regimen and home exercises, discussed which activities to do which wants to avoid.  Patient had a repeat steroid injections every 10 weeks and will follow-up at that time.

## 2018-03-08 NOTE — Patient Instructions (Addendum)
Good to see you  We will get xrays downstairs Knee injections given today  We will get you into PT  See me again in 4-5 weeks

## 2018-03-08 NOTE — Assessment & Plan Note (Signed)
Continues to have pain.  X-rays of neck ordered, history of osteoporosis, patient is going to also be sent for physical therapy that I think will be beneficial.  See how patient responds to conservative therapy.  Patient is already on gabapentin.  Follow-up again in 6 weeks

## 2018-03-17 ENCOUNTER — Encounter (HOSPITAL_COMMUNITY): Payer: Self-pay

## 2018-03-17 ENCOUNTER — Other Ambulatory Visit: Payer: Self-pay

## 2018-03-17 ENCOUNTER — Emergency Department (HOSPITAL_COMMUNITY): Payer: Medicare Other

## 2018-03-17 ENCOUNTER — Emergency Department (HOSPITAL_COMMUNITY)
Admission: EM | Admit: 2018-03-17 | Discharge: 2018-03-17 | Disposition: A | Discharge status: Home / Self Care | Payer: Medicare Other | Attending: Emergency Medicine | Admitting: Emergency Medicine

## 2018-03-17 DIAGNOSIS — I10 Essential (primary) hypertension: Secondary | ICD-10-CM | POA: Diagnosis not present

## 2018-03-17 DIAGNOSIS — Z87891 Personal history of nicotine dependence: Secondary | ICD-10-CM | POA: Diagnosis not present

## 2018-03-17 DIAGNOSIS — Z041 Encounter for examination and observation following transport accident: Secondary | ICD-10-CM | POA: Diagnosis not present

## 2018-03-17 DIAGNOSIS — R079 Chest pain, unspecified: Secondary | ICD-10-CM | POA: Insufficient documentation

## 2018-03-17 DIAGNOSIS — E119 Type 2 diabetes mellitus without complications: Secondary | ICD-10-CM | POA: Diagnosis not present

## 2018-03-17 DIAGNOSIS — Z7984 Long term (current) use of oral hypoglycemic drugs: Secondary | ICD-10-CM | POA: Diagnosis not present

## 2018-03-17 DIAGNOSIS — Z7982 Long term (current) use of aspirin: Secondary | ICD-10-CM | POA: Insufficient documentation

## 2018-03-17 LAB — I-STAT TROPONIN, ED: Troponin i, poc: 0 ng/mL (ref 0.00–0.08)

## 2018-03-17 NOTE — ED Notes (Signed)
D/c reviewed with patient-no further questions at this time 

## 2018-03-17 NOTE — Discharge Instructions (Addendum)
EKG, chest x-ray, troponin (heart chemical) were all normal.  You will be sore for several days.  Follow-up your primary care doctor.
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
## 2018-03-18 ENCOUNTER — Other Ambulatory Visit: Payer: Self-pay

## 2018-03-20 NOTE — Congregational Nurse Program (Signed)
Client seen at church.  Reports woke up this morning with pain in shoulders, after while felt better and was able to come to church.  Asked for prayer.  Has history of shoulder pain, but now feels better.

## 2018-03-22 ENCOUNTER — Ambulatory Visit: Payer: Medicare Other | Admitting: Physical Therapy

## 2018-03-29 ENCOUNTER — Other Ambulatory Visit: Payer: Self-pay

## 2018-03-29 NOTE — Patient Outreach (Signed)
[facility]) Care Management  03/29/2018  Amy Escobar 11/21/1945 [id number]  TELEPHONE SCREENING Referral date: 03/18/18 Referral source:  ED census  Referral reason: EMMI engagement outreach  Insurance: [employer]  Telephone call to patient regarding referral. HIPAA verified.  Patient states she spoke with someone from [facility].  She states she is doing fine at this time. Patient reports she is managing her health conditions. Patient states her blood pressure is doing good and her last A1c was 5.4.  Patient states she has back pain due to arthritis and states she will be having physical therapy for this on 04/01/18.  Patient denies any needs at this time. Declined [facility] care management services. Patient verbally agreed to having [facility] management brochure/ magnet mailed to her for future reference.   PLAN: RNCM will close patient due to refusal of services.  RNCM will send patient [facility] care management brochure / magnet RNCM will send patients primary MD closure notification   [name] RN,BSN,CCM [facility] Telephonic  [phone]

## 2018-04-01 ENCOUNTER — Other Ambulatory Visit: Payer: Self-pay

## 2018-04-01 ENCOUNTER — Encounter: Payer: Self-pay | Admitting: Physical Therapy

## 2018-04-01 ENCOUNTER — Ambulatory Visit: Payer: Medicare Other | Attending: Family Medicine | Admitting: Physical Therapy

## 2018-04-01 DIAGNOSIS — G8929 Other chronic pain: Secondary | ICD-10-CM | POA: Insufficient documentation

## 2018-04-01 DIAGNOSIS — M545 Low back pain: Secondary | ICD-10-CM | POA: Insufficient documentation

## 2018-04-01 DIAGNOSIS — R293 Abnormal posture: Secondary | ICD-10-CM | POA: Insufficient documentation

## 2018-04-01 DIAGNOSIS — M62838 Other muscle spasm: Secondary | ICD-10-CM | POA: Insufficient documentation

## 2018-04-01 NOTE — Addendum Note (Signed)
Addended by: Larey Days on: 04/01/2018 12:22 PM   Modules accepted: Orders

## 2018-04-01 NOTE — Therapy (Addendum)
[place], [place], [address] Phone: [phone]   Fax:  [phone]  Physical Therapy Evaluation / Discharge Summary  Patient Details  Name: Amy Escobar MRN: [id number] Date of Birth: 1946-05-30 Referring Provider (PT): Hulan Saas DO   Encounter Date: 04/01/2018  PT End of Session - 04/01/18 0941    Visit Number  1    Number of Visits  13    Date for PT Re-Evaluation  05/13/18    Authorization Type  MCR: Kx mod by 15th visit, Progress not at 10th    PT Start Time  0848    PT Stop Time  0932    PT Time Calculation (min)  44 min    Activity Tolerance  Patient tolerated treatment well    Behavior During Therapy  [facility] for tasks assessed/performed       Past Medical History:  Diagnosis Date  . Alkaline phosphatase deficiency    w/u Ne  . Allergic rhinitis   . Anxiety   . Arthritis   . DM2 (diabetes mellitus, type 2) ([place])   . GERD (gastroesophageal reflux disease)   . Gout   . Hemorrhoids   . Hyperlipidemia   . Hypertension   . Mild pulmonary hypertension ([place])   . NSVT (nonsustained ventricular tachycardia) ([place])   . Osteoporosis   . PVC's (premature ventricular contractions)   . Thyroid nodule    small  . Uterine fibroid     Past Surgical History:  Procedure Laterality Date  . CATARACT EXTRACTION Bilateral 2016  . COLONOSCOPY  2007  . echocardiogram (other)  01/16/2002  . 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 stress cardiolite  02/12/2006  . TOENAIL EXCISION    . TUBAL LIGATION      There were no vitals filed for this visit.   Subjective Assessment - 04/01/18 0906    Subjective  pt is a 72 pt y.o F with CC of upper back and shoulder pain that has been going on for a while unable to report a specific MOI or a specific amount of time. reports helping one of her friends using a RW and after having to repetitively fold the walker and put it up she felt it overworked her  shoulder, in combinattion with overuse at work. Reports pain is in the upper and lower middle of the backand fluctuate between upper and lower back, and stays the same since it started.     Limitations  Standing;Walking    How long can you sit comfortably?  1 hour    How long can you stand comfortably?  30 min     How long can you walk comfortably?  1 hour    Diagnostic tests  neck and thoracic x-ray on 9/10    Patient Stated Goals  to get rid of the pain, return to doing normal house work/ activities    Currently in Pain?  Yes    Pain Score  6    at worst pain gets up to a 7-8/10   Pain Location  Back    Pain Orientation  Upper;Lower;Right;Left    Pain Descriptors / Indicators  Tightness;Sharp;Aching;Constant;Burning    Pain Type  Chronic pain    Pain Onset  More than a month ago    Pain Frequency  Constant    Aggravating Factors   bending forward,     Pain Relieving Factors  keeping moving around, tylenol    Effect of  Pain on Daily Activities  limited endurance         [facility] PT Assessment - 04/01/18 0856      Assessment   Medical Diagnosis  Back pain     Referring Provider (PT)  Hulan Saas DO    Onset Date/Surgical Date  --   going on for a long while   Hand Dominance  Right    Next MD Visit  04/07/2018    Prior Therapy  yes      Precautions   Precautions  None      Restrictions   Weight Bearing Restrictions  No      Balance Screen   Has the patient fallen in the past 6 months  No      [place] residence    Living Arrangements  Alone    Type of [place] Access  Level entry    Home Layout  One level    [place] - single point      Prior Function   Level of Independence  Independent with basic ADLs    Vocation  Retired    Leisure  reading, watching TV, walking, working on Engineer, materials   Overall Cognitive Status  Within Functional Limits for tasks assessed       Observation/Other Assessments   Focus on Therapeutic Outcomes (FOTO)   40% limited   predicted 32% limited     Posture/Postural Control   Posture/Postural Control  Postural limitations    Postural Limitations  Rounded Shoulders;Forward head      ROM / Strength   AROM / PROM / Strength  AROM;Strength;PROM      AROM   AROM Assessment Site  Shoulder;Lumbar;Thoracic    Lumbar Flexion  50    Lumbar Extension  10    Lumbar - Right Side Bend  2    Lumbar - Left Side Bend  20    Thoracic Flexion  22    Thoracic Extension  8    Thoracic - Right Side Bend  20    Thoracic - Left Side Bend  10      Strength   Strength Assessment Site  Shoulder    Right/Left Shoulder  Right;Left      Palpation   Spinal mobility  hypomobility of L1-L5     Palpation comment  TTP along bil thoracolumbar paraspinals. and bil upper traps,                Objective measurements completed on examination: See above findings.      [place] Adult PT Treatment/Exercise - 04/01/18 0856      Exercises   Exercises  Lumbar      Lumbar Exercises: Stretches   Lower Trunk Rotation Limitations  2 x 10    Other Lumbar Stretch Exercise  seated low back stretch 2 x 30 sec walking handsout on chair      Lumbar Exercises: Supine   Pelvic Tilt  10 reps;5 seconds      Neck Exercises: Stretches   Upper Trapezius Stretch  2 reps;30 seconds             PT Education - 04/01/18 0856    Education Details  evaluation findings, POC, goals, HEp with proper form/ rationale, anatomy of the area involved.     Person(s) Educated  Patient    Methods  Explanation;Verbal cues;Handout;Demonstration  Comprehension  Verbalized understanding;Verbal cues required;Returned demonstration       PT Short Term Goals - 04/01/18 0951      PT SHORT TERM GOAL #1   Title  pt to be I with inital HEP    Time  3    Period  Weeks    Status  New    Target Date  04/22/18      PT SHORT TERM GOAL #2   Title  pt to verbalize  and demo proper posture and lifting mechanics to prevent and reduce back pain     Time  3    Period  Weeks    Status  New    Target Date  04/22/18      PT SHORT TERM GOAL #3   Title  reduce muscle spasm in the parapsinals to reduce pain to </= 5/10 pain promote trunk mobility     Time  3    Period  Weeks    Status  New    Target Date  04/22/18        PT Long Term Goals - 04/01/18 1214      PT LONG TERM GOAL #1   Title  increase lumbar flexion to >/= 60 degrees and extension and bil sidebeindg to >/= 18 degrees  with </= 2/10 pain for functional mobility required for ADLs    Time  6    Period  Weeks    Status  New    Target Date  05/13/18      PT LONG TERM GOAL #2   Title  pt to be able to walk/ stand for ./= 45 min with </= 2/10 pain for functional endurance required for community ambulation    Time  6    Period  Weeks    Status  New    Target Date  05/13/18      PT LONG TERM GOAL #3   Title  increase FOTO score to </= 32% limited to demo improvement in function     Time  6    Period  Weeks    Status  New    Target Date  05/13/18      PT LONG TERM GOAL #4   Title  pt to be I with all HEP given as of last visit to maintain and progress current level of function    Time  6    Period  Weeks    Status  New    Target Date  05/13/18             Plan - 04/01/18 0942    Clinical Impression Statement  pt presents to OPPT with CC of chronic upper and lower back pain with no specific MOI. she demosntrates limited lumbar range motion relying heaviy on thoracic ROM for overall trunk mobility. TTP along bil lumbar paraspinals with hypomobilty along L1-L5 and muscle tightness in bil upper traps. she would benefit from physical therapy to decrease muscle spasm, promote trunk mobility, reduce pain and maximize function by addressing the deficits listed.     History and Personal Factors relevant to plan of care:  pt lives alone, hx of diabetes    Clinical Presentation  Evolving     Clinical Presentation due to:  chronic low back pain muscle spasm, limited trunk mobility, low back pain that hasn't improved fluctuating in nature    Clinical Decision Making  Moderate    Rehab Potential  Good    PT Frequency  2x / week    PT Duration  6 weeks    PT Treatment/Interventions  ADLs/Self Care Home Management;Cryotherapy;Iontophoresis 63m/ml Dexamethasone;Moist Heat;Traction;Ultrasound;Balance training;Neuromuscular re-education;Therapeutic activities;Therapeutic exercise;Manual techniques;Taping;Dry needling;Patient/family education;Passive range of motion;Gait training    PT Next Visit Plan  review/ update HEP, promote lumbar movmement,  ROM for shoulder, STW for low back, core work, modality PRN, posture,     PT Home Exercise Plan  lower trunk rotation, supine pelvic tilt, low back stretch, upper trap stretch     Consulted and Agree with Plan of Care  Patient       Patient will benefit from skilled therapeutic intervention in order to improve the following deficits and impairments:  Pain, Decreased activity tolerance, Decreased endurance, Decreased balance, Improper body mechanics, Postural dysfunction, Increased fascial restricitons, Decreased range of motion  Visit Diagnosis: Chronic bilateral low back pain without sciatica  Abnormal posture  Other muscle spasm     Problem List Patient Active Problem List   Diagnosis Date Noted  . Trigger point of shoulder region, left 02/07/2018  . Hypertension   . Mild pulmonary hypertension ([place]   . NSVT (nonsustained ventricular tachycardia) ([place]   . PVC's (premature ventricular contractions)   . URI (upper respiratory infection) 08/09/2016  . Neck pain 02/10/2016  . Urinary incontinence 02/10/2016  . Degenerative arthritis of knee, bilateral 07/17/2015  . Knee MCL sprain 06/07/2015  . Discomfort in chest 02/15/2015  . Chest pain 02/15/2015  . DM type 2, controlled, with complication ([place]   . Wellness examination  08/06/2014  . Acute meniscal tear of knee 02/13/2014  . Primary localized osteoarthrosis, lower leg 02/13/2014  . Gastrocnemius tear 12/25/2013  . UTI (urinary tract infection) 12/20/2013  . Right knee pain 12/20/2013  . Pulmonary hypertension ([place] 10/06/2013  . Dyspnea 08/04/2013  . Dysphagia, unspecified(787.20) 08/10/2012  . Hip pain, left 02/02/2012  . Painful respiration 05/25/2011  . Encounter for long-term (current) use of other medications 01/30/2011  . PELVIC PAIN, LEFT 07/30/2010  . TOBACCO USE, QUIT 07/07/2010  . FATIGUE 12/20/2009  . Headache 12/20/2009  . Low back pain 12/26/2008  . Disorder of liver 04/13/2008  . FOOT PAIN, BILATERAL 04/13/2008  . FIBROIDS, UTERUS 11/24/2007  . THYROID NODULE, LEFT 11/24/2007  . HYPERCHOLESTEROLEMIA 11/24/2007  . CARPAL TUNNEL SYNDROME, BILATERAL 11/24/2007  . ALKALINE PHOSPHATASE, ELEVATED 11/24/2007  . Gout 08/10/2007  . ANXIETY 08/10/2007  . Essential hypertension 08/10/2007  . Cough 08/01/2007  . Allergic rhinitis 01/14/2007  . GERD 01/14/2007  . Osteoporosis 01/14/2007   KStarr LakePT, DPT, LAT, ATC  04/01/18  12:20 PM      [facility] System14[address] DriveGLexington NAl[place] [address]Phone: [phone]  Fax:  [phone] Name: Amy EMRICHMRN: [id number] of Birth: 1February 08, 1947    PHYSICAL THERAPY DISCHARGE SUMMARY  Visits from Start of Care: 1  Current functional level related to goals / functional outcomes: See goals   Remaining deficits: unknown   Education / Equipment: HEP  Plan: Patient agrees to discharge.  Patient goals were not met. Patient is being discharged due to not returning since the last visit.  ?????        Kennetta Pavlovic PT, DPT, LAT, ATC  06/07/18  11:12 AM
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
## 2018-04-07 ENCOUNTER — Ambulatory Visit (INDEPENDENT_AMBULATORY_CARE_PROVIDER_SITE_OTHER): Payer: Medicare Other | Admitting: Family Medicine

## 2018-04-07 ENCOUNTER — Ambulatory Visit: Payer: Medicare Other | Admitting: Physical Therapy

## 2018-04-07 ENCOUNTER — Encounter: Payer: Self-pay | Admitting: Family Medicine

## 2018-04-07 VITALS — BP 100/60 | HR 83 | Ht 64.0 in | Wt 164.0 lb

## 2018-04-07 DIAGNOSIS — M170 Bilateral primary osteoarthritis of knee: Secondary | ICD-10-CM

## 2018-04-07 DIAGNOSIS — M25512 Pain in left shoulder: Secondary | ICD-10-CM | POA: Diagnosis not present

## 2018-04-07 DIAGNOSIS — M1A9XX0 Chronic gout, unspecified, without tophus (tophi): Secondary | ICD-10-CM

## 2018-04-07 DIAGNOSIS — G8929 Other chronic pain: Secondary | ICD-10-CM | POA: Diagnosis not present

## 2018-04-07 MED ORDER — METHYLPREDNISOLONE ACETATE 80 MG/ML IJ SUSP
80.0000 mg | Freq: Once | INTRAMUSCULAR | Status: AC
  Administered 2018-04-07: 80 mg via INTRAMUSCULAR

## 2018-04-07 MED ORDER — KETOROLAC TROMETHAMINE 60 MG/2ML IM SOLN
60.0000 mg | Freq: Once | INTRAMUSCULAR | Status: AC
  Administered 2018-04-07: 60 mg via INTRAMUSCULAR

## 2018-04-07 NOTE — Patient Instructions (Signed)
God to see you  Keep hands within peripheral vision Ice is your friend Try to keep shoulder blades back  2 injections today to help with pain  See me again in 5 weeks and can repeat knee injections and or shoulder if needed

## 2018-04-07 NOTE — Assessment & Plan Note (Signed)
Patient will be having a flare as well.  We discussed the possibility of other medications which patient declined at this time.  We will continue to monitor.  May need to change patient's logical thiazide.

## 2018-04-07 NOTE — Assessment & Plan Note (Signed)
Moderate to severe.  Injections 1 month ago.  Can discuss the possibility of Visco supplementation again but not for another 3 months.  Discussed Toradol and Depo-Medrol given today.  Warned of potential side effects.  Discussed icing regimen.  Topical anti-inflammatories given.  Discussed which activities to do which wants to avoid.  Follow-up again in 4 to 8 weeks

## 2018-04-08 ENCOUNTER — Other Ambulatory Visit: Payer: Self-pay | Admitting: Endocrinology

## 2018-04-12 ENCOUNTER — Ambulatory Visit: Payer: Medicare Other | Admitting: Family

## 2018-04-12 DIAGNOSIS — Z0289 Encounter for other administrative examinations: Secondary | ICD-10-CM

## 2018-04-13 ENCOUNTER — Ambulatory Visit: Payer: Medicare Other | Admitting: Family

## 2018-04-13 ENCOUNTER — Ambulatory Visit: Payer: Medicare Other | Admitting: Nurse Practitioner

## 2018-04-19 ENCOUNTER — Ambulatory Visit: Payer: Medicare Other | Admitting: Physical Therapy

## 2018-04-20 ENCOUNTER — Ambulatory Visit: Payer: Medicare Other | Admitting: Physical Therapy

## 2018-04-26 ENCOUNTER — Encounter: Payer: Medicare Other | Admitting: Physical Therapy

## 2018-04-28 ENCOUNTER — Encounter: Payer: Medicare Other | Admitting: Physical Therapy

## 2018-05-15 NOTE — Progress Notes (Signed)
[name] [address], [place] [address] Phone: [phone] Subjective:    I Amy Escobar am serving as a Education administrator for Dr. Hulan Saas.   CC: Bilateral shoulder and knee pain  PPJ:[id number]  Amy Escobar is a 72 y.o. female coming in with complaint of bilateral shoulder pain. States her knees are also painful. Wants injections today.  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  Last injections were 3 months ago.  Worsening pain at this moment.  Affecting daily activities and having difficulty even sleeping at night.  Patient has had pain in the shoulders as well.  Not as severe as the knees though today.       Past Medical History:  Diagnosis Date  . Alkaline phosphatase deficiency    w/u Ne  . Allergic rhinitis   . Anxiety   . Arthritis   . DM2 (diabetes mellitus, type 2) ([place])   . GERD (gastroesophageal reflux disease)   . Gout   . Hemorrhoids   . Hyperlipidemia   . Hypertension   . Mild pulmonary hypertension ([place])   . NSVT (nonsustained ventricular tachycardia) ([place])   . Osteoporosis   . PVC's (premature ventricular contractions)   . Thyroid nodule    small  . Uterine fibroid    Past Surgical History:  Procedure Laterality Date  . CATARACT EXTRACTION Bilateral 2016  . COLONOSCOPY  2007  . echocardiogram (other)  01/16/2002  . 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 stress cardiolite  02/12/2006  . TOENAIL EXCISION    . TUBAL LIGATION     Social History   Socioeconomic History  . Marital status: Widowed    Spouse name: Not on file  . Number of children: 2  . Years of education: Not on file  . Highest education level: Not on file  Occupational History  . Occupation: Surveyor, minerals: UNEMPLOYED  Social Needs  . Financial resource strain: Not on file  . Food insecurity:    Worry: Not on file    Inability: Not on file  . Transportation needs:    Medical: Not on file    Non-medical:  Not on file  Tobacco Use  . Smoking status: Former Smoker    Packs/day: 0.25    Years: 5.00    Pack years: 1.25    Types: Cigarettes    Last attempt to quit: 06/29/1968    Years since quitting: 49.9  . Smokeless tobacco: Never Used  Substance and Sexual Activity  . Alcohol use: No    Alcohol/week: 0.0 standard drinks  . Drug use: No  . Sexual activity: Not on file  Lifestyle  . Physical activity:    Days per week: Not on file    Minutes per session: Not on file  . Stress: Not on file  Relationships  . Social connections:    Talks on phone: Not on file    Gets together: Not on file    Attends religious service: Not on file    Active member of club or organization: Not on file    Attends meetings of clubs or organizations: Not on file    Relationship status: Not on file  Other Topics Concern  . Not on file  Social History Narrative   Widowed 2010.    Allergies  Allergen Reactions  . Olmesartan Medoxomil     REACTION: headache   Family History  Problem Relation Age  of Onset  . Heart disease Mother   . Heart disease Father   . Heart disease Maternal Grandfather   . Rectal cancer Maternal Grandfather   . Stomach cancer Maternal Grandmother   . Colon cancer Neg Hx   . Breast cancer Neg Hx     Current Outpatient Medications (Endocrine & Metabolic):  .  metFORMIN (GLUCOPHAGE-XR) 500 MG 24 hr tablet, Take 1 tablet (500 mg total) by mouth daily.  Current Outpatient Medications (Cardiovascular):  .  amLODipine (NORVASC) 2.5 MG tablet, Take 1 tablet (2.5 mg total) by mouth daily. .  rosuvastatin (CRESTOR) 5 MG tablet, TAKE ONE TABLET BY MOUTH DAILY .  valsartan-hydrochlorothiazide (DIOVAN-HCT) 160-12.5 MG tablet, TAKE ONE TABLET BY MOUTH DAILY  Current Outpatient Medications (Respiratory):  .  loratadine-pseudoephedrine (CLARITIN-D 12-HOUR) 5-120 MG tablet, Take 1 tablet by mouth daily. .  promethazine-dextromethorphan (PROMETHAZINE-DM) 6.25-15 MG/5ML syrup, Take 2.5 mLs  4 (four) times daily as needed by mouth for cough.  Current Outpatient Medications (Analgesics):  .  aspirin EC 81 MG tablet, Take 81 mg by mouth daily.   Current Outpatient Medications (Other):  .  Blood Glucose Monitoring Suppl (ONE TOUCH ULTRA MINI) w/Device KIT, 1 Device by Does not apply route as needed. .  cephALEXin (KEFLEX) 500 MG capsule, Take 1 capsule (500 mg total) by mouth 3 (three) times daily. Marland Kitchen  esomeprazole (NEXIUM) 40 MG capsule, TAKE ONE CAPSULE BY MOUTH DAILY BEFORE BREAKFAST .  fesoterodine (TOVIAZ) 4 MG TB24 tablet, Take 4 mg daily by mouth. .  gabapentin (NEURONTIN) 600 MG tablet, TAKE ONE TABLET BY MOUTH THREE TIMES A DAY .  ONE TOUCH ULTRA TEST test strip, USE STRIP TO CHECK BLOOD SUGAR DAILY .  triamcinolone cream (KENALOG) 0.1 %, APPLY 1 APPLICATION TOPICALLY 4 (FOUR) TIMES DAILY AS NEEDED FOR RASH .  UNKNOWN TO PATIENT, Inject as directed every 6 (six) months. 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  Vitamin D, Ergocalciferol, (DRISDOL) 50000 units CAPS capsule, TAKE ONE CAPSULE BY MOUTH ONCE WEEKLY    Past medical history, social, surgical and family history all reviewed in electronic medical record.  No pertanent information unless stated regarding to the chief complaint.   Review of Systems:  No headache, visual changes, nausea, vomiting, diarrhea, constipation, dizziness, abdominal pain, skin rash, fevers, chills, night sweats, weight loss, swollen lymph nodes, , joint swelling,  chest pain, shortness of breath, mood changes.  Positive body aches, muscle aches  Objective  Blood pressure 116/62, pulse 85, height '5\' 4"'  (1.626 m), weight 165 lb (74.8 kg), SpO2 98 %.   General: No apparent distress alert and oriented x3 mood and affect normal, dressed appropriately.  HEENT: Pupils equal, extraocular movements intact  Respiratory: Patient's speak in full sentences and does not appear short of breath  Cardiovascular: No lower extremity edema, non  tender, no erythema  Skin: Warm dry intact with no signs of infection or rash on extremities or on axial skeleton.  Abdomen: Soft nontender  Neuro: Cranial nerves II through XII are intact, neurovascularly intact in all extremities with 2+ DTRs and 2+ pulses.  Lymph: No lymphadenopathy of posterior or anterior cervical chain or axillae bilaterally.  Gait mild antalgic MSK:  tender with full range of motion and good stability and symmetric strength and tone of , elbows, wrist, hip, and ankles bilaterally.  Arthritic changes of the shoulders bilaterally Knee: Bilateral valgus deformity noted. Large thigh to calf ratio.  Tender to palpation over medial and PF joint line.  ROM full in flexion and extension and lower leg rotation. instability with valgus force.  painful patellar compression. Patellar glide with moderate crepitus. Patellar and quadriceps tendons unremarkable. Hamstring and quadriceps strength is normal.  After informed written and verbal consent, patient was seated on exam table. Left knee was prepped with alcohol swab and utilizing anterolateral approach, patient's right knee space was injected with 4:1  marcaine 0.5%: Kenalog 68m/dL. Patient tolerated the procedure well without immediate complications.  After informed written and verbal consent, patient was seated on exam table. Right knee was prepped with alcohol swab and utilizing anterolateral approach, patient's right knee space was injected with 4:1  marcaine 0.5%: Kenalog 460mdL. Patient tolerated the procedure well without immediate complications. Cont Impression and Recommendations:     This case required medical decision making of moderate complexity. The above documentation has been reviewed and is accurate and complete Amy Escobar       Note: This dictation was prepared with Dragon dictation along with smaller phrase technology. Any transcriptional errors that result from this process are  unintentional.

## 2018-05-16 ENCOUNTER — Ambulatory Visit (INDEPENDENT_AMBULATORY_CARE_PROVIDER_SITE_OTHER): Payer: Medicare Other | Admitting: Family Medicine

## 2018-05-16 ENCOUNTER — Encounter: Payer: Self-pay | Admitting: Family Medicine

## 2018-05-16 DIAGNOSIS — M170 Bilateral primary osteoarthritis of knee: Secondary | ICD-10-CM

## 2018-05-16 NOTE — Patient Instructions (Addendum)
Good to see you  Ice is your friend Injected both knees.  Have a good [place] day  See me again in 4 weeks

## 2018-05-16 NOTE — Assessment & Plan Note (Signed)
Patient given bilateral injections again today.  Patient has done the Visco supplementation previously with very mild improvement.  Discussed icing regimen and home exercises.  Discussed topical anti-inflammatories.  Patient will continue to try to be active on a more regular basis.  Follow-up again in 4 to 8 weeks

## 2018-05-24 ENCOUNTER — Other Ambulatory Visit: Payer: Self-pay | Admitting: Endocrinology

## 2018-05-27 ENCOUNTER — Other Ambulatory Visit: Payer: Self-pay | Admitting: Endocrinology

## 2018-06-15 ENCOUNTER — Ambulatory Visit: Payer: Medicare Other | Admitting: Family Medicine

## 2018-06-15 ENCOUNTER — Encounter: Payer: Self-pay | Admitting: Family Medicine

## 2018-06-15 ENCOUNTER — Ambulatory Visit: Payer: Self-pay

## 2018-06-15 VITALS — BP 104/68 | HR 83 | Ht 64.0 in | Wt 166.0 lb

## 2018-06-15 DIAGNOSIS — M25551 Pain in right hip: Secondary | ICD-10-CM

## 2018-06-15 DIAGNOSIS — M7061 Trochanteric bursitis, right hip: Secondary | ICD-10-CM | POA: Insufficient documentation

## 2018-06-15 DIAGNOSIS — M170 Bilateral primary osteoarthritis of knee: Secondary | ICD-10-CM

## 2018-06-15 DIAGNOSIS — M25552 Pain in left hip: Secondary | ICD-10-CM | POA: Diagnosis not present

## 2018-06-15 DIAGNOSIS — M7062 Trochanteric bursitis, left hip: Secondary | ICD-10-CM

## 2018-06-15 NOTE — Assessment & Plan Note (Signed)
Monovisc given again today.  But in 6 months again.  Hopefully patient responds well to the injections.  Discussed icing regimen and home exercises.  Discussed which activities to do which wants to avoid.  Increase activity as tolerated.  Follow-up again in 6 weeks

## 2018-06-15 NOTE — Progress Notes (Signed)
[name] [address], [place] [address] Phone: [phone] Subjective:   Amy Escobar, am serving as a scribe for Dr. Hulan Saas.    CC: Bilateral knee pain, bilateral hip pain  KQA:[id number]  Amy Escobar is a 72 y.o. female coming in with complaint of bilateral knee pain, right worse than left. Has not been active in the gym but has been walking a lot.  Patient has known arthritic changes.  Failed all conservative therapy including steroid injections.  Here for likely Visco supplementation.  Has responded well to that in the past.  Patient also is having bilateral hip pain that is chronic in nature. Pain is dull and is over the greater trochanter.  States that it is tender when patient is trying to sleep.  Escobar radiation down the leg.  Patient does have tightness with certain movements.  Especially when trying to cross her legs across each other.    Past Medical History:  Diagnosis Date  . Alkaline phosphatase deficiency    w/u Ne  . Allergic rhinitis   . Anxiety   . Arthritis   . DM2 (diabetes mellitus, type 2) ([place])   . GERD (gastroesophageal reflux disease)   . Gout   . Hemorrhoids   . Hyperlipidemia   . Hypertension   . Mild pulmonary hypertension ([place])   . NSVT (nonsustained ventricular tachycardia) ([place])   . Osteoporosis   . PVC's (premature ventricular contractions)   . Thyroid nodule    small  . Uterine fibroid    Past Surgical History:  Procedure Laterality Date  . CATARACT EXTRACTION Bilateral 2016  . COLONOSCOPY  2007  . echocardiogram (other)  01/16/2002  . 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 stress cardiolite  02/12/2006  . TOENAIL EXCISION    . TUBAL LIGATION     Social History   Socioeconomic History  . Marital status: Widowed    Spouse name: Not on file  . Number of children: 2  . Years of education: Not on file  . Highest education level: Not on file  Occupational History  .  Occupation: Surveyor, minerals: UNEMPLOYED  Social Needs  . Financial resource strain: Not on file  . Food insecurity:    Worry: Not on file    Inability: Not on file  . Transportation needs:    Medical: Not on file    Non-medical: Not on file  Tobacco Use  . Smoking status: Former Smoker    Packs/day: 0.25    Years: 5.00    Pack years: 1.25    Types: Cigarettes    Last attempt to quit: 06/29/1968    Years since quitting: 49.9  . Smokeless tobacco: Never Used  Substance and Sexual Activity  . Alcohol use: Escobar    Alcohol/week: 0.0 standard drinks  . Drug use: Escobar  . Sexual activity: Not on file  Lifestyle  . Physical activity:    Days per week: Not on file    Minutes per session: Not on file  . Stress: Not on file  Relationships  . Social connections:    Talks on phone: Not on file    Gets together: Not on file    Attends religious service: Not on file    Active member of club or organization: Not on file    Attends meetings of clubs or organizations: Not on file    Relationship status: Not on file  Other Topics Concern  . Not on file  Social History Narrative   Widowed 2010.    Allergies  Allergen Reactions  . Olmesartan Medoxomil     REACTION: headache   Family History  Problem Relation Age of Onset  . Heart disease Mother   . Heart disease Father   . Heart disease Maternal Grandfather   . Rectal cancer Maternal Grandfather   . Stomach cancer Maternal Grandmother   . Colon cancer Neg Hx   . Breast cancer Neg Hx     Current Outpatient Medications (Endocrine & Metabolic):  .  metFORMIN (GLUCOPHAGE-XR) 500 MG 24 hr tablet, Take 1 tablet (500 mg total) by mouth daily. .  metFORMIN (GLUCOPHAGE-XR) 500 MG 24 hr tablet, TAKE TWO TABLETS BY MOUTH TWICE A DAY .  metFORMIN (GLUCOPHAGE-XR) 500 MG 24 hr tablet, TAKE TWO TABLETS BY MOUTH TWICE A DAY  Current Outpatient Medications (Cardiovascular):  .  amLODipine (NORVASC) 2.5 MG tablet, Take 1 tablet (2.5 mg total)  by mouth daily. .  rosuvastatin (CRESTOR) 5 MG tablet, TAKE ONE TABLET BY MOUTH DAILY .  valsartan-hydrochlorothiazide (DIOVAN-HCT) 160-12.5 MG tablet, TAKE ONE TABLET BY MOUTH DAILY  Current Outpatient Medications (Respiratory):  .  loratadine-pseudoephedrine (CLARITIN-D 12-HOUR) 5-120 MG tablet, Take 1 tablet by mouth daily. .  promethazine-dextromethorphan (PROMETHAZINE-DM) 6.25-15 MG/5ML syrup, Take 2.5 mLs 4 (four) times daily as needed by mouth for cough.  Current Outpatient Medications (Analgesics):  .  aspirin EC 81 MG tablet, Take 81 mg by mouth daily.   Current Outpatient Medications (Other):  .  Blood Glucose Monitoring Suppl (ONE TOUCH ULTRA MINI) w/Device KIT, 1 Device by Does not apply route as needed. .  cephALEXin (KEFLEX) 500 MG capsule, Take 1 capsule (500 mg total) by mouth 3 (three) times daily. Marland Kitchen  esomeprazole (NEXIUM) 40 MG capsule, TAKE ONE CAPSULE BY MOUTH DAILY BEFORE BREAKFAST .  fesoterodine (TOVIAZ) 4 MG TB24 tablet, Take 4 mg daily by mouth. .  gabapentin (NEURONTIN) 600 MG tablet, TAKE ONE TABLET BY MOUTH THREE TIMES A DAY .  ONE TOUCH ULTRA TEST test strip, USE STRIP TO CHECK BLOOD SUGAR DAILY .  triamcinolone cream (KENALOG) 0.1 %, APPLY 1 APPLICATION TOPICALLY 4 (FOUR) TIMES DAILY AS NEEDED FOR RASH .  UNKNOWN TO PATIENT, Inject as directed every 6 (six) months. 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  Vitamin D, Ergocalciferol, (DRISDOL) 50000 units CAPS capsule, TAKE ONE CAPSULE BY MOUTH ONCE WEEKLY    Past medical history, social, surgical and family history all reviewed in electronic medical record.  Escobar pertanent information unless stated regarding to the chief complaint.   Review of Systems:  Escobar headache, visual changes, nausea, vomiting, diarrhea, constipation, dizziness, abdominal pain, skin rash, fevers, chills, night sweats, weight loss, swollen lymph nodes,  chest pain, shortness of breath, mood changes.  Positive muscle aches and body  aches  Objective  Blood pressure 104/68, pulse 83, height '5\' 4"'  (1.626 m), weight 166 lb (75.3 kg), SpO2 97 %.    General: Escobar apparent distress alert and oriented x3 mood and affect normal, dressed appropriately.  HEENT: Pupils equal, extraocular movements intact  Respiratory: Patient's speak in full sentences and does not appear short of breath  Cardiovascular: Escobar lower extremity edema, non tender, Escobar erythema  Skin: Warm dry intact with Escobar signs of infection or rash on extremities or on axial skeleton.  Abdomen: Soft nontender  Neuro: Cranial nerves II through XII are intact, neurovascularly intact in all extremities  with 2+ DTRs and 2+ pulses.  Lymph: Escobar lymphadenopathy of posterior or anterior cervical chain or axillae bilaterally.  Gait mild antalgic MSK:  tender with mild limited range of motion and good stability and symmetric strength and tone of shoulders, elbows, wrist,  and ankles bilaterally.  Bilateral hip exam shows the patient is severely tender to palpation in the greater trochanteric area bilaterally.  Patient has tightness with Corky Sox test.  Mild tightness with straight leg test but minimal discomfort in the back.  Knee: Bilateral valgus deformity noted.  Abnormal thigh to calf ratio.  Tender to palpation over medial and PF joint line.  ROM full in flexion and extension and lower leg rotation. instability with valgus force.  painful patellar compression. Patellar glide with moderate crepitus. Patellar and quadriceps tendons unremarkable. Hamstring and quadriceps strength is normal.   After informed written and verbal consent, patient was seated on exam table. Right knee was prepped with alcohol swab and utilizing anterolateral approach, patient's right knee space was injected with 22 mg/mL of Monovisc (sodium hyaluronate) in a prefilled syringe was injected easily into the knee through a 22-gauge needle..Patient tolerated the procedure well without immediate  complications.  After informed written and verbal consent, patient was seated on exam table. Left knee was prepped with alcohol swab and utilizing anterolateral approach, patient's left knee space was injected with 22 mg/mL of Monovisc(sodium hyaluronate) in a prefilled syringe was injected easily into the knee through a 22-gauge needle..Patient tolerated the procedure well without immediate complications.   Procedure: Real-time Ultrasound Guided Injection of right greater trochanteric bursitis secondary to patient's body habitus Device: GE Logiq Q7 Ultrasound guided injection is preferred based studies that show increased duration, increased effect, greater accuracy, decreased procedural pain, increased response rate, and decreased cost with ultrasound guided versus blind injection.  Verbal informed consent obtained.  Time-out conducted.  Noted Escobar overlying erythema, induration, or other signs of local infection.  Skin prepped in a sterile fashion.  Local anesthesia: Topical Ethyl chloride.  With sterile technique and under real time ultrasound guidance:  Greater trochanteric area was visualized and patient's bursa was noted. A 22-gauge 3 inch needle was inserted and 4 cc of 0.5% Marcaine and 1 cc of Kenalog 40 mg/dL was injected. Pictures taken Completed without difficulty  Pain immediately resolved suggesting accurate placement of the medication.  Advised to call if fevers/chills, erythema, induration, drainage, or persistent bleeding.  Images permanently stored and available for review in the ultrasound unit.  Impression: Technically successful ultrasound guided injection.   Procedure: Real-time Ultrasound Guided Injection of left  greater trochanteric bursitis secondary to patient's body habitus Device: GE Logiq Q7  Ultrasound guided injection is preferred based studies that show increased duration, increased effect, greater accuracy, decreased procedural pain, increased response rate,  and decreased cost with ultrasound guided versus blind injection.  Verbal informed consent obtained.  Time-out conducted.  Noted Escobar overlying erythema, induration, or other signs of local infection.  Skin prepped in a sterile fashion.  Local anesthesia: Topical Ethyl chloride.  With sterile technique and under real time ultrasound guidance:  Greater trochanteric area was visualized and patient's bursa was noted. A 22-gauge 3 inch needle was inserted and 4 cc of 0.5% Marcaine and 1 cc of Kenalog 40 mg/dL was injected. Pictures taken Completed without difficulty  Pain immediately resolved suggesting accurate placement of the medication.  Advised to call if fevers/chills, erythema, induration, drainage, or persistent bleeding.  Images permanently stored and available for review in the  ultrasound unit.  Impression: Technically successful ultrasound guided injection.   Impression and Recommendations:     This case required medical decision making of moderate complexity. The above documentation has been reviewed and is accurate and complete Lyndal Pulley, DO       Note: This dictation was prepared with Dragon dictation along with smaller phrase technology. Any transcriptional errors that result from this process are unintentional.

## 2018-06-15 NOTE — Patient Instructions (Signed)
Good to see you  Ice is your friend Stay active Injected the knees and the hips today  pennsaid pinkie amount topically 2 times daily as needed.  Happy holidays!  Happy New Year!  See me again in 3 months!

## 2018-06-15 NOTE — Assessment & Plan Note (Signed)
Bilateral injections given today.  Tolerated the procedure well.  Discussed icing regimen and home exercise.  Discussed which activities of doing which wants to avoid.  Increase activity as tolerated.  Follow-up again in 4 to 6 weeks

## 2018-06-30 ENCOUNTER — Other Ambulatory Visit: Payer: Self-pay | Admitting: Family

## 2018-06-30 ENCOUNTER — Telehealth: Payer: Self-pay | Admitting: Endocrinology

## 2018-06-30 DIAGNOSIS — Z1231 Encounter for screening mammogram for malignant neoplasm of breast: Secondary | ICD-10-CM

## 2018-06-30 NOTE — Telephone Encounter (Signed)
Called pt to clarify request. States she is established with PCP. If she has UTI symptoms, advised she call for treatment from her PCP. In the meantime, advised she monitor her glucose readings. If elevated, advised she call our office for Dr. Loanne Drilling to adjust medications as needed. Verbalized acceptance and understanding.

## 2018-06-30 NOTE — Telephone Encounter (Signed)
Patient stated she has a UTI and would like to see if something can been sent into her pharmacy  Please advise

## 2018-07-01 DIAGNOSIS — R3914 Feeling of incomplete bladder emptying: Secondary | ICD-10-CM | POA: Diagnosis not present

## 2018-07-01 DIAGNOSIS — R350 Frequency of micturition: Secondary | ICD-10-CM | POA: Diagnosis not present

## 2018-07-02 ENCOUNTER — Other Ambulatory Visit: Payer: Self-pay | Admitting: Endocrinology

## 2018-07-02 ENCOUNTER — Other Ambulatory Visit: Payer: Self-pay | Admitting: Cardiology

## 2018-07-02 NOTE — Telephone Encounter (Signed)
Please refill x 3 months Further refills would have to be considered by new PCP   

## 2018-07-04 ENCOUNTER — Ambulatory Visit (INDEPENDENT_AMBULATORY_CARE_PROVIDER_SITE_OTHER)
Admission: RE | Admit: 2018-07-04 | Discharge: 2018-07-04 | Disposition: A | Discharge status: Home / Self Care | Payer: Medicare Other | Source: Ambulatory Visit | Attending: Family | Admitting: Family

## 2018-07-04 ENCOUNTER — Other Ambulatory Visit (INDEPENDENT_AMBULATORY_CARE_PROVIDER_SITE_OTHER): Payer: Medicare Other

## 2018-07-04 ENCOUNTER — Ambulatory Visit: Payer: Medicare Other | Admitting: Endocrinology

## 2018-07-04 ENCOUNTER — Ambulatory Visit (INDEPENDENT_AMBULATORY_CARE_PROVIDER_SITE_OTHER): Payer: Medicare Other | Admitting: Family

## 2018-07-04 ENCOUNTER — Encounter: Payer: Self-pay | Admitting: Family

## 2018-07-04 ENCOUNTER — Encounter: Payer: Medicare Other | Admitting: Family

## 2018-07-04 VITALS — BP 110/70 | HR 90 | Temp 98.6°F | Ht <= 58 in | Wt 164.0 lb

## 2018-07-04 DIAGNOSIS — M8580 Other specified disorders of bone density and structure, unspecified site: Secondary | ICD-10-CM

## 2018-07-04 DIAGNOSIS — I10 Essential (primary) hypertension: Secondary | ICD-10-CM

## 2018-07-04 DIAGNOSIS — R5383 Other fatigue: Secondary | ICD-10-CM

## 2018-07-04 DIAGNOSIS — K219 Gastro-esophageal reflux disease without esophagitis: Secondary | ICD-10-CM | POA: Diagnosis not present

## 2018-07-04 DIAGNOSIS — E118 Type 2 diabetes mellitus with unspecified complications: Secondary | ICD-10-CM

## 2018-07-04 DIAGNOSIS — E785 Hyperlipidemia, unspecified: Secondary | ICD-10-CM | POA: Diagnosis not present

## 2018-07-04 DIAGNOSIS — M8589 Other specified disorders of bone density and structure, multiple sites: Secondary | ICD-10-CM

## 2018-07-04 LAB — CBC WITH DIFFERENTIAL/PLATELET
BASOS ABS: 0.1 10*3/uL (ref 0.0–0.1)
Basophils Relative: 0.9 % (ref 0.0–3.0)
Eosinophils Absolute: 0.2 10*3/uL (ref 0.0–0.7)
Eosinophils Relative: 2.8 % (ref 0.0–5.0)
HCT: 34.8 % — ABNORMAL LOW (ref 36.0–46.0)
HEMOGLOBIN: 11.6 g/dL — AB (ref 12.0–15.0)
LYMPHS ABS: 2.5 10*3/uL (ref 0.7–4.0)
Lymphocytes Relative: 28.9 % (ref 12.0–46.0)
MCHC: 33.3 g/dL (ref 30.0–36.0)
MCV: 86 fl (ref 78.0–100.0)
MONO ABS: 0.8 10*3/uL (ref 0.1–1.0)
MONOS PCT: 10 % (ref 3.0–12.0)
Neutro Abs: 4.9 10*3/uL (ref 1.4–7.7)
Neutrophils Relative %: 57.4 % (ref 43.0–77.0)
Platelets: 207 10*3/uL (ref 150.0–400.0)
RBC: 4.04 Mil/uL (ref 3.87–5.11)
RDW: 14.6 % (ref 11.5–15.5)
WBC: 8.5 10*3/uL (ref 4.0–10.5)

## 2018-07-04 LAB — COMPREHENSIVE METABOLIC PANEL
ALT: 19 U/L (ref 0–35)
AST: 17 U/L (ref 0–37)
Albumin: 3.7 g/dL (ref 3.5–5.2)
Alkaline Phosphatase: 64 U/L (ref 39–117)
BILIRUBIN TOTAL: 0.3 mg/dL (ref 0.2–1.2)
BUN: 15 mg/dL (ref 6–23)
CO2: 29 meq/L (ref 19–32)
CREATININE: 0.9 mg/dL (ref 0.40–1.20)
Calcium: 9.3 mg/dL (ref 8.4–10.5)
Chloride: 99 mEq/L (ref 96–112)
GFR: 78.94 mL/min (ref >60.00)
GLUCOSE: 89 mg/dL (ref 70–99)
Potassium: 3.8 mEq/L (ref 3.5–5.1)
Sodium: 135 mEq/L (ref 135–145)
Total Protein: 6.7 g/dL (ref 6.0–8.3)

## 2018-07-04 LAB — LIPID PANEL
CHOL/HDL RATIO: 3
Cholesterol: 133 mg/dL (ref 0–200)
HDL: 50.3 mg/dL (ref >39.00)
LDL Cholesterol: 70 mg/dL (ref 0–99)
NONHDL: 82.9
Triglycerides: 64 mg/dL (ref 0.0–149.0)
VLDL: 12.8 mg/dL (ref 0.0–40.0)

## 2018-07-04 LAB — TSH: TSH: 1.01 u[IU]/mL (ref 0.35–4.50)

## 2018-07-04 LAB — MAGNESIUM: MAGNESIUM: 1.8 mg/dL (ref 1.5–2.5)

## 2018-07-04 LAB — HEMOGLOBIN A1C: Hgb A1c MFr Bld: 5.8 % (ref 4.6–6.5)

## 2018-07-04 NOTE — Progress Notes (Signed)
Amy Escobar is a 73 y.o. female with the following history as recorded in EpicCare:  Patient Active Problem List   Diagnosis Date Noted  . Greater trochanteric bursitis of both hips 06/15/2018  . Trigger point of shoulder region, left 02/07/2018  . Hypertension   . Mild pulmonary hypertension ([place])   . NSVT (nonsustained ventricular tachycardia) ([place])   . PVC's (premature ventricular contractions)   . URI (upper respiratory infection) 08/09/2016  . Neck pain 02/10/2016  . Urinary incontinence 02/10/2016  . Degenerative arthritis of knee, bilateral 07/17/2015  . Knee MCL sprain 06/07/2015  . Discomfort in chest 02/15/2015  . Chest pain 02/15/2015  . DM type 2, controlled, with complication ([place])   . Wellness examination 08/06/2014  . Acute meniscal tear of knee 02/13/2014  . Primary localized osteoarthrosis, lower leg 02/13/2014  . Gastrocnemius tear 12/25/2013  . UTI (urinary tract infection) 12/20/2013  . Right knee pain 12/20/2013  . Pulmonary hypertension ([place]) 10/06/2013  . Dyspnea 08/04/2013  . Dysphagia, unspecified(787.20) 08/10/2012  . Hip pain, left 02/02/2012  . Painful respiration 05/25/2011  . Encounter for long-term (current) use of other medications 01/30/2011  . PELVIC PAIN, LEFT 07/30/2010  . TOBACCO USE, QUIT 07/07/2010  . FATIGUE 12/20/2009  . Headache 12/20/2009  . Low back pain 12/26/2008  . Disorder of liver 04/13/2008  . FOOT PAIN, BILATERAL 04/13/2008  . FIBROIDS, UTERUS 11/24/2007  . THYROID NODULE, LEFT 11/24/2007  . HYPERCHOLESTEROLEMIA 11/24/2007  . CARPAL TUNNEL SYNDROME, BILATERAL 11/24/2007  . ALKALINE PHOSPHATASE, ELEVATED 11/24/2007  . Gout 08/10/2007  . ANXIETY 08/10/2007  . Essential hypertension 08/10/2007  . Cough 08/01/2007  . Allergic rhinitis 01/14/2007  . GERD 01/14/2007  . Osteoporosis 01/14/2007    Current Outpatient Medications  Medication Sig Dispense Refill  . amLODipine (NORVASC) 2.5 MG tablet Take 1 tablet (2.5  mg total) by mouth daily. 90 tablet 2  . aspirin EC 81 MG tablet Take 81 mg by mouth daily.    . Blood Glucose Monitoring Suppl (ONE TOUCH ULTRA MINI) w/Device KIT 1 Device by Does not apply route as needed. 1 each 0  . cetirizine (ZYRTEC) 10 MG tablet Take 10 mg by mouth daily.    Marland Kitchen esomeprazole (NEXIUM) 40 MG capsule TAKE ONE CAPSULE BY MOUTH DAILY BEFORE BREAKFAST 90 capsule 0  . fesoterodine (TOVIAZ) 4 MG TB24 tablet Take 4 mg daily by mouth.    . gabapentin (NEURONTIN) 600 MG tablet TAKE ONE TABLET BY MOUTH THREE TIMES A DAY 270 tablet 8  . metFORMIN (GLUCOPHAGE-XR) 500 MG 24 hr tablet TAKE TWO TABLETS BY MOUTH TWICE A DAY 360 tablet 0  . ONE TOUCH ULTRA TEST test strip USE STRIP TO CHECK BLOOD SUGAR DAILY 100 each 1  . rosuvastatin (CRESTOR) 5 MG tablet TAKE ONE TABLET BY MOUTH DAILY 90 tablet 1  . triamcinolone cream (KENALOG) 0.1 % APPLY 1 APPLICATION TOPICALLY 4 (FOUR) TIMES DAILY AS NEEDED FOR RASH 45 g 1  . UNKNOWN TO PATIENT Inject as directed every 6 (six) months. 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 valsartan-hydrochlorothiazide (DIOVAN-HCT) 160-12.5 MG tablet TAKE ONE TABLET BY MOUTH DAILY 90 tablet 0  . Vitamin D, Ergocalciferol, (DRISDOL) 50000 units CAPS capsule TAKE ONE CAPSULE BY MOUTH ONCE WEEKLY 12 capsule 0   No current facility-administered medications for this visit.     Allergies: Olmesartan medoxomil  Past Medical History:  Diagnosis Date  . Alkaline phosphatase deficiency    w/u Ne  .  Allergic rhinitis   . Anxiety   . Arthritis   . DM2 (diabetes mellitus, type 2) ([place])   . GERD (gastroesophageal reflux disease)   . Gout   . Hemorrhoids   . Hyperlipidemia   . Hypertension   . Mild pulmonary hypertension ([place])   . NSVT (nonsustained ventricular tachycardia) ([place])   . Osteoporosis   . PVC's (premature ventricular contractions)   . Thyroid nodule    small  . Uterine fibroid     Past Surgical History:  Procedure Laterality Date  .  CATARACT EXTRACTION Bilateral 2016  . COLONOSCOPY  2007  . echocardiogram (other)  01/16/2002  . 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 stress cardiolite  02/12/2006  . TOENAIL EXCISION    . TUBAL LIGATION      Family History  Problem Relation Age of Onset  . Heart disease Mother   . Heart disease Father   . Heart disease Maternal Grandfather   . Rectal cancer Maternal Grandfather   . Stomach cancer Maternal Grandmother   . Colon cancer Neg Hx   . Breast cancer Neg Hx     Social History   Tobacco Use  . Smoking status: Former Smoker    Packs/day: 0.25    Years: 5.00    Pack years: 1.25    Types: Cigarettes    Last attempt to quit: 06/29/1968    Years since quitting: 50.0  . Smokeless tobacco: Never Used  Substance Use Topics  . Alcohol use: No    Alcohol/week: 0.0 standard drinks    Subjective:  Patient presents to transfer care from Dr. Loanne Drilling; Will keep Dr. Loanne Drilling for Type 2 Diabetes; Dr. Meda Coffee for cardiac needs (CAD)/ cholesterol- every 6 months; GERD- takes Nexium daily;  Urology- Dr. Dory Larsen- manages [facility];  History of neuropathy- takes Gabapentin; Dr. Tamala Julian- every 3 month for arthritis management;  Sees her eye doctor every year;     Objective:  Vitals:   07/04/18 1454  BP: 110/70  Pulse: 90  Temp: 98.6 F (37 C)  TempSrc: Oral  SpO2: 97%  Weight: 164 lb 0.6 oz (74.4 kg)  Height: 1' (0.305 m)  HC: 64" (162.6 cm)    General: Well developed, well nourished, in no acute distress  Skin : Warm and dry.  Head: Normocephalic and atraumatic  Eyes: Sclera and conjunctiva clear; pupils round and reactive to light; extraocular movements intact  Ears: External normal; canals clear; tympanic membranes normal  Oropharynx: Pink, supple. No suspicious lesions  Neck: Supple without thyromegaly, adenopathy  Lungs: Respirations unlabored; clear to auscultation bilaterally without wheeze, rales, rhonchi  CVS exam: normal rate and regular rhythm.  Abdomen:  Soft; nontender; nondistended; normoactive bowel sounds; no masses or hepatosplenomegaly  Musculoskeletal: No deformities; no active joint inflammation  Extremities: No edema, cyanosis, clubbing  Vessels: Symmetric bilaterally  Neurologic: Alert and oriented; speech intact; face symmetrical; moves all extremities well; CNII-XII intact without focal deficit   Assessment:  1. Essential hypertension   2. Hyperlipidemia, unspecified hyperlipidemia type   3. DM type 2, controlled, with complication ([place])   4. Other fatigue   5. Osteopenia, unspecified location   6. Gastroesophageal reflux disease without esophagitis     Plan:   Labs updated as requested; continue with specialists as scheduled; if labs are all normal, will schedule patient for sleep study due to chronic fatigue; follow-up in 6 months, sooner prn.   No follow-ups on file.  Orders Placed This Encounter  Procedures  .  DG Bone Density    Standing Status:   Future    Number of Occurrences:   1    Standing Expiration Date:   09/02/2019    Order Specific Question:   Reason for Exam (SYMPTOM  OR DIAGNOSIS REQUIRED)    Answer:   osteopenia    Order Specific Question:   Preferred imaging location?    Answer:   [facility]  . CBC w/Diff    Standing Status:   Future    Number of Occurrences:   1    Standing Expiration Date:   07/04/2019  . Comp Met (CMET)    Standing Status:   Future    Number of Occurrences:   1    Standing Expiration Date:   07/04/2019  . Lipid panel    Standing Status:   Future    Number of Occurrences:   1    Standing Expiration Date:   07/05/2019  . HgB A1c    Standing Status:   Future    Number of Occurrences:   1    Standing Expiration Date:   07/04/2019  . TSH    Standing Status:   Future    Number of Occurrences:   1    Standing Expiration Date:   07/04/2019  . Magnesium    Standing Status:   Future    Number of Occurrences:   1    Standing Expiration Date:   07/04/2019    Requested Prescriptions     No prescriptions requested or ordered in this encounter

## 2018-07-05 ENCOUNTER — Other Ambulatory Visit: Payer: Self-pay | Admitting: Family

## 2018-07-05 DIAGNOSIS — D649 Anemia, unspecified: Secondary | ICD-10-CM

## 2018-07-05 DIAGNOSIS — G4719 Other hypersomnia: Secondary | ICD-10-CM

## 2018-07-10 ENCOUNTER — Other Ambulatory Visit: Payer: Self-pay | Admitting: Family Medicine

## 2018-07-11 ENCOUNTER — Encounter: Payer: Self-pay | Admitting: Cardiology

## 2018-07-11 ENCOUNTER — Ambulatory Visit (INDEPENDENT_AMBULATORY_CARE_PROVIDER_SITE_OTHER): Payer: Medicare Other | Admitting: Cardiology

## 2018-07-11 ENCOUNTER — Encounter: Payer: Self-pay | Admitting: *Deleted

## 2018-07-11 VITALS — BP 110/64 | HR 93 | Ht 64.5 in | Wt 163.0 lb

## 2018-07-11 DIAGNOSIS — I272 Pulmonary hypertension, unspecified: Secondary | ICD-10-CM

## 2018-07-11 DIAGNOSIS — I10 Essential (primary) hypertension: Secondary | ICD-10-CM | POA: Diagnosis not present

## 2018-07-11 DIAGNOSIS — R0600 Dyspnea, unspecified: Secondary | ICD-10-CM

## 2018-07-11 DIAGNOSIS — E782 Mixed hyperlipidemia: Secondary | ICD-10-CM

## 2018-07-11 DIAGNOSIS — E7800 Pure hypercholesterolemia, unspecified: Secondary | ICD-10-CM

## 2018-07-11 DIAGNOSIS — I2510 Atherosclerotic heart disease of native coronary artery without angina pectoris: Secondary | ICD-10-CM

## 2018-07-11 DIAGNOSIS — R072 Precordial pain: Secondary | ICD-10-CM | POA: Diagnosis not present

## 2018-07-11 DIAGNOSIS — R0789 Other chest pain: Secondary | ICD-10-CM

## 2018-07-11 DIAGNOSIS — R0609 Other forms of dyspnea: Secondary | ICD-10-CM

## 2018-07-11 MED ORDER — VALSARTAN 80 MG PO TABS: 80.0000 mg | ORAL_TABLET | Freq: Every day | ORAL | 1 refills | Status: DC

## 2018-07-11 MED ORDER — HYDROCHLOROTHIAZIDE 12.5 MG PO CAPS: 12.5000 mg | ORAL_CAPSULE | Freq: Every day | ORAL | 1 refills | Status: DC

## 2018-07-11 NOTE — Progress Notes (Signed)
Patient ID: Amy Escobar, female   DOB: 03-Feb-1946, 73 y.o.   MRN: [id number]    Patient Name: Amy Escobar Date of Encounter: 07/11/2018  Primary Care Provider:  [name], [place] Primary Cardiologist:  Ena Dawley  Problem List   Past Medical History:  Diagnosis Date  . Alkaline phosphatase deficiency    w/u Ne  . Allergic rhinitis   . Anxiety   . Arthritis   . DM2 (diabetes mellitus, type 2) ([place])   . GERD (gastroesophageal reflux disease)   . Gout   . Hemorrhoids   . Hyperlipidemia   . Hypertension   . Mild pulmonary hypertension ([place])   . NSVT (nonsustained ventricular tachycardia) ([place])   . Osteoporosis   . PVC's (premature ventricular contractions)   . Thyroid nodule    small  . Uterine fibroid    Past Surgical History:  Procedure Laterality Date  . CATARACT EXTRACTION Bilateral 2016  . COLONOSCOPY  2007  . echocardiogram (other)  01/16/2002  . 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 stress cardiolite  02/12/2006  . TOENAIL EXCISION    . TUBAL LIGATION     Allergies  Allergies  Allergen Reactions  . Olmesartan Medoxomil     REACTION: headache   HPI  73 year old with hypertension, hyperlipidemia, insulin-dependent diabetes mellitus, moderate pulmonary hypertension, known moderate nonobstructive CAD with coming after 6 months with concerns of chest pain.  Those are not related to exertion, they can radiate to her neck.  She has been also experiencing orthostatic hypotension.  She denies any muscle pain.  Home Medications  Prior to Admission medications   Medication Sig Start Date End Date Taking? Authorizing Provider  Blood Glucose Monitoring Suppl (ONE TOUCH ULTRA MINI) W/DEVICE KIT 1 Device by Does not apply route once. 11/25/12  Yes Renato Shin, MD  gabapentin (NEURONTIN) 300 MG capsule One three times a day 09/12/13  Yes Tanda Rockers, MD  naproxen (NAPROSYN) 500 MG tablet Take 1 tablet (500 mg total) by mouth 2 (two) times  daily with a meal. 08/10/12  Yes Renato Shin, MD  NEXIUM 40 MG capsule TAKE 1 CAPSULE (40 MG TOTAL) BY MOUTH DAILY BEFORE BREAKFAST.   Yes Renato Shin, MD  ONE TOUCH ULTRA TEST test strip TEST BLOOD SUGAR THREE(3) TIMES DAILY 06/24/13  Yes Renato Shin, MD  [facility] DELICA LANCETS [address] USE AS DIRECTED BY PRESCRIBER THREE TIMES A DAY   Yes Renato Shin, MD  simvastatin (ZOCOR) 80 MG tablet TAKE 1 TABLET BY MOUTH EVERY NIGHT AT BEDTIME   Yes Renato Shin, MD  valsartan-hydrochlorothiazide (DIOVAN-HCT) 160-12.5 MG per tablet TAKE 1 TABLET BY MOUTH DAILY 07/29/13  Yes Renato Shin, MD    Family History  Family History  Problem Relation Age of Onset  . Heart disease Mother   . Heart disease Father   . Heart disease Maternal Grandfather   . Rectal cancer Maternal Grandfather   . Stomach cancer Maternal Grandmother   . Colon cancer Neg Hx   . Breast cancer Neg Hx     Social History  Social History   Socioeconomic History  . Marital status: Widowed    Spouse name: Not on file  . Number of children: 2  . Years of education: Not on file  . Highest education level: Not on file  Occupational History  . Occupation: Surveyor, minerals: UNEMPLOYED  Social Needs  . Financial resource strain: Not on file  . Food  insecurity:    Worry: Not on file    Inability: Not on file  . Transportation needs:    Medical: Not on file    Non-medical: Not on file  Tobacco Use  . Smoking status: Former Smoker    Packs/day: 0.25    Years: 5.00    Pack years: 1.25    Types: Cigarettes    Last attempt to quit: 06/29/1968    Years since quitting: 50.0  . Smokeless tobacco: Never Used  Substance and Sexual Activity  . Alcohol use: No    Alcohol/week: 0.0 standard drinks  . Drug use: No  . Sexual activity: Not on file  Lifestyle  . Physical activity:    Days per week: Not on file    Minutes per session: Not on file  . Stress: Not on file  Relationships  . Social connections:    Talks on  phone: Not on file    Gets together: Not on file    Attends religious service: Not on file    Active member of club or organization: Not on file    Attends meetings of clubs or organizations: Not on file    Relationship status: Not on file  . Intimate partner violence:    Fear of current or ex partner: Not on file    Emotionally abused: Not on file    Physically abused: Not on file    Forced sexual activity: Not on file  Other Topics Concern  . Not on file  Social History Narrative   Widowed 2010.     Review of Systems, as per HPI, otherwise negative General:  No chills, fever, night sweats or weight changes.  Cardiovascular:  No chest pain, dyspnea on exertion, edema, orthopnea, palpitations, paroxysmal nocturnal dyspnea. Dermatological: No rash, lesions/masses Respiratory: No cough, dyspnea Urologic: No hematuria, dysuria Abdominal:   No nausea, vomiting, diarrhea, bright red blood per rectum, melena, or hematemesis Neurologic:  No visual changes, wkns, changes in mental status. All other systems reviewed and are otherwise negative except as noted above.  Physical Exam  Blood pressure 110/64, pulse 93, height 5' 4.5" (1.638 m), weight 163 lb (73.9 kg), SpO2 95 %.  General: Pleasant, NAD Psych: Normal affect. Neuro: Alert and oriented X 3. Moves all extremities spontaneously. HEENT: Normal  Neck: Supple without bruits or JVD. Lungs:  Resp regular and unlabored, CTA. Heart: RRR no s3, s4, or murmurs. Abdomen: Soft, non-tender, non-distended, BS + x 4.  Extremities: No clubbing, cyanosis or edema. DP/PT/Radials 2+ and equal bilaterally.  Labs:  No results for input(s): CKTOTAL, CKMB, TROPONINI in the last 72 hours. Lab Results  Component Value Date   WBC 8.5 07/04/2018   HGB 11.6 (L) 07/04/2018   HCT 34.8 (L) 07/04/2018   MCV 86.0 07/04/2018   PLT 207.0 07/04/2018    Lab Results  Component Value Date   DDIMER <0.27 02/15/2015   Invalid input(s): POCBNP      Component Value Date/Time   NA 135 07/04/2018 1554   NA 139 04/07/2017 0805   K 3.8 07/04/2018 1554   CL 99 07/04/2018 1554   CO2 29 07/04/2018 1554   GLUCOSE 89 07/04/2018 1554   BUN 15 07/04/2018 1554   BUN 14 04/07/2017 0805   CREATININE 0.90 07/04/2018 1554   CREATININE 0.73 06/08/2016 0801   CALCIUM 9.3 07/04/2018 1554   PROT 6.7 07/04/2018 1554   PROT 7.2 01/06/2018 0743   ALBUMIN 3.7 07/04/2018 1554   ALBUMIN 4.2 01/06/2018 0743  AST 17 07/04/2018 1554   ALT 19 07/04/2018 1554   ALKPHOS 64 07/04/2018 1554   BILITOT 0.3 07/04/2018 1554   BILITOT 0.3 01/06/2018 0743   GFRNONAA 65 04/07/2017 0805   GFRAA 74 04/07/2017 0805   Lab Results  Component Value Date   CHOL 133 07/04/2018   HDL 50.30 07/04/2018   LDLCALC 70 07/04/2018   TRIG 64.0 07/04/2018   Accessory Clinical Findings  Echocardiogram - 09/20/2013 - Left ventricle: The cavity size was normal. Wall thickness was normal. Systolic function was low normal to mildly reduced. The estimated ejection fraction was in the range of 50% to 55%. Wall motion was normal; there were no regional wall motion abnormalities. Doppler parameters are consistent with abnormal left ventricular relaxation (grade 1 diastolic dysfunction). - Aortic valve: There was no stenosis. - Mitral valve: Mildly calcified annulus. Normal thickness leaflets . Mild regurgitation. - Left atrium: The atrium was mildly dilated. - Right ventricle: The cavity size was normal. Systolic function was normal. - Tricuspid valve: Peak RV-RA gradient: 14m Hg (S). - Pulmonary arteries: PA peak pressure: 465mHg (S). - Systemic veins: IVC not visualized. Impressions:  - Normal LV size with low normal to mildly reduced systolic function, EF 5085-92%Normal RV size and systolic function. Mild MR. Mild pulmonary hypertension.  04/10/2014 Left ventricle: The cavity size was normal. Wall thickness was increased in a pattern of mild LVH. Systolic  function was normal. The estimated ejection fraction was in the range of 55% to 60%. Wall motion was normal; there were no regional wall motion abnormalities. Doppler parameters are consistent with abnormal left ventricular relaxation (grade 1 diastolic dysfunction). - Pulmonary arteries: Systolic pressure was mildly increased. PA peak pressure: 36 mm Hg (S).  Impressions: - Normal LV function; mild LVH; grade 1 diastolic dysfunction; mild TR; mildly elevated pulmonary pressure. Compared to 09/20/13, LV function remains normal.  Coronary CTA 11/18/2016 IMPRESSION: 1. Coronary calcium score of 220. This was [id number] for age and sex matched control. 2. Normal coronary origin.  Right dominance. 3. Moderate plaque in the ostial LM and mild plaque in the ostial RCA, the study will be sent out for an additional analysis with CT FFR.    Assessment & Plan  1. Chest pain -Known nonobstructive CAD on coronary CT in 2018.  The pain is atypical however she had moderate left main disease with normal FFR at the time, we will obtain exercise nuclear stress test.  2.  Moderate pulmonary hypertension -Stable dyspnea on exertion.  Will continue amlodipine.  3.  Hypertension -Now with orthostatic hypotension, we will discontinue Diovan and start valsartan 80 daily.  4.  Hyperlipidemia -Continue low-dose Crestor, most recent LFTs were normal.  Follow up in 6 months.   KaEna DawleyMD, FAPacific Northwest Eye Surgery Center/13/2020, 10:03 AM

## 2018-07-11 NOTE — Patient Instructions (Signed)
Medication Instructions:   STOP TAKING YOUR COMBO MED VALSARTAN/HCTZ   START TAKING VALSARTAN 80 MG ONCE DAILY  START TAKING HYDROCHLOROTHIAZIDE 12.5 MG ONCE DAILY     Testing/Procedures:  Your physician has requested that you have en exercise stress myoview. For further information please visit [email or web address]. Please follow instruction sheet, as given. DO ON D-SPECT PER DR NELSON     Follow-Up:  4 MONTHS WITH DR Meda Coffee       If you need a refill on your cardiac medications before your next appointment, please call your pharmacy.

## 2018-07-12 ENCOUNTER — Telehealth (HOSPITAL_COMMUNITY): Payer: Self-pay | Admitting: *Deleted

## 2018-07-12 NOTE — Telephone Encounter (Signed)
Patient given detailed instructions per Myocardial Perfusion Study Information Sheet for the test on 07/15/18 at 7:30. Patient notified to arrive 15 minutes early and that it is imperative to arrive on time for appointment to keep from having the test rescheduled.  If you need to cancel or reschedule your appointment, please call the office within 24 hours of your appointment. . Patient verbalized understanding.Amy Escobar

## 2018-07-13 ENCOUNTER — Encounter: Payer: Self-pay | Admitting: Endocrinology

## 2018-07-13 ENCOUNTER — Ambulatory Visit (INDEPENDENT_AMBULATORY_CARE_PROVIDER_SITE_OTHER): Payer: Medicare Other | Admitting: Endocrinology

## 2018-07-13 VITALS — BP 108/68 | HR 95 | Ht 64.5 in | Wt 166.2 lb

## 2018-07-13 DIAGNOSIS — E118 Type 2 diabetes mellitus with unspecified complications: Secondary | ICD-10-CM | POA: Diagnosis not present

## 2018-07-13 LAB — POCT GLYCOSYLATED HEMOGLOBIN (HGB A1C): Hemoglobin A1C: 5.4 % (ref 4.0–5.6)

## 2018-07-13 NOTE — Progress Notes (Signed)
Subjective:    Patient ID: Amy Escobar, female    DOB: 1945/08/17, 73 y.o.   MRN: [id number]  HPI Pt returns for f/u of diabetes mellitus: DM type: 2 Dx'ed: 5726 Complications: CAD Therapy: metformin GDM: never.   DKA: never.   Severe hypoglycemia: never.   Pancreatitis: never.  Other: she has never been on insulin; she gets occasional steroid injections into the knees.   Interval history: pt states she feels well in general.  Past Medical History:  Diagnosis Date  . Alkaline phosphatase deficiency    w/u Ne  . Allergic rhinitis   . Anxiety   . Arthritis   . DM2 (diabetes mellitus, type 2) ([place])   . GERD (gastroesophageal reflux disease)   . Gout   . Hemorrhoids   . Hyperlipidemia   . Hypertension   . Mild pulmonary hypertension ([place])   . NSVT (nonsustained ventricular tachycardia) ([place])   . Osteoporosis   . PVC's (premature ventricular contractions)   . Thyroid nodule    small  . Uterine fibroid     Past Surgical History:  Procedure Laterality Date  . CATARACT EXTRACTION Bilateral 2016  . COLONOSCOPY  2007  . echocardiogram (other)  01/16/2002  . 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 stress cardiolite  02/12/2006  . TOENAIL EXCISION    . TUBAL LIGATION      Social History   Socioeconomic History  . Marital status: Widowed    Spouse name: Not on file  . Number of children: 2  . Years of education: Not on file  . Highest education level: Not on file  Occupational History  . Occupation: Surveyor, minerals: UNEMPLOYED  Social Needs  . Financial resource strain: Not on file  . Food insecurity:    Worry: Not on file    Inability: Not on file  . Transportation needs:    Medical: Not on file    Non-medical: Not on file  Tobacco Use  . Smoking status: Former Smoker    Packs/day: 0.25    Years: 5.00    Pack years: 1.25    Types: Cigarettes    Last attempt to quit: 06/29/1968    Years since quitting: 50.0  . Smokeless tobacco: Never Used   Substance and Sexual Activity  . Alcohol use: No    Alcohol/week: 0.0 standard drinks  . Drug use: No  . Sexual activity: Not on file  Lifestyle  . Physical activity:    Days per week: Not on file    Minutes per session: Not on file  . Stress: Not on file  Relationships  . Social connections:    Talks on phone: Not on file    Gets together: Not on file    Attends religious service: Not on file    Active member of club or organization: Not on file    Attends meetings of clubs or organizations: Not on file    Relationship status: Not on file  . Intimate partner violence:    Fear of current or ex partner: Not on file    Emotionally abused: Not on file    Physically abused: Not on file    Forced sexual activity: Not on file  Other Topics Concern  . Not on file  Social History Narrative   Widowed 2010.     Current Outpatient Medications on File Prior to Visit  Medication Sig Dispense Refill  . amLODipine (NORVASC) 2.5 MG tablet Take  1 tablet (2.5 mg total) by mouth daily. 90 tablet 2  . aspirin EC 81 MG tablet Take 81 mg by mouth daily.    . cetirizine (ZYRTEC) 10 MG tablet Take 10 mg by mouth daily.    Marland Kitchen esomeprazole (NEXIUM) 40 MG capsule TAKE ONE CAPSULE BY MOUTH DAILY BEFORE BREAKFAST 90 capsule 0  . fesoterodine (TOVIAZ) 4 MG TB24 tablet Take 4 mg daily by mouth.    . gabapentin (NEURONTIN) 600 MG tablet TAKE ONE TABLET BY MOUTH THREE TIMES A DAY 270 tablet 8  . hydrochlorothiazide (MICROZIDE) 12.5 MG capsule Take 1 capsule (12.5 mg total) by mouth daily. 90 capsule 1  . metFORMIN (GLUCOPHAGE-XR) 500 MG 24 hr tablet TAKE TWO TABLETS BY MOUTH TWICE A DAY 360 tablet 0  . ONE TOUCH ULTRA TEST test strip USE STRIP TO CHECK BLOOD SUGAR DAILY 100 each 1  . rosuvastatin (CRESTOR) 5 MG tablet TAKE ONE TABLET BY MOUTH DAILY 90 tablet 1  . triamcinolone cream (KENALOG) 0.1 % APPLY 1 APPLICATION TOPICALLY 4 (FOUR) TIMES DAILY AS NEEDED FOR RASH 45 g 1  . UNKNOWN TO PATIENT Inject as  directed every 6 (six) months. 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 valsartan (DIOVAN) 80 MG tablet Take 1 tablet (80 mg total) by mouth daily. 90 tablet 1  . Vitamin D, Ergocalciferol, (DRISDOL) 1.25 MG (50000 UT) CAPS capsule TAKE ONE CAPSULE BY MOUTH ONCE WEEKLY 12 capsule 0   No current facility-administered medications on file prior to visit.     Allergies  Allergen Reactions  . Olmesartan Medoxomil     REACTION: headache    Family History  Problem Relation Age of Onset  . Heart disease Mother   . Heart disease Father   . Heart disease Maternal Grandfather   . Rectal cancer Maternal Grandfather   . Stomach cancer Maternal Grandmother   . Colon cancer Neg Hx   . Breast cancer Neg Hx     BP 108/68 (BP Location: Left Arm, Patient Position: Sitting, Cuff Size: Normal)   Pulse 95   Ht 5' 4.5" (1.638 m)   Wt 166 lb 3.2 oz (75.4 kg)   SpO2 97%   BMI 28.09 kg/m    Review of Systems Denies leg swelling.      Objective:   Physical Exam VITAL SIGNS:  See vs page GENERAL: no distress Pulses: dorsalis pedis intact bilat.   MSK: no deformity of the feet CV: no leg edema Skin:  no ulcer on the feet.  normal color and temp on the feet. Neuro: sensation is intact to touch on the feet Ext: both great toenails are absent.     Lab Results  Component Value Date   HGBA1C 5.4 07/13/2018       Assessment & Plan:  Type 2 DM, with CAD.  Knee pain: steroid injections can transiently affect glycemic control.   Patient Instructions  Please continue the same metformin.  Please carefully check the blood sugar for a few days after each steroid injection.   Please come back for a follow-up appointment in 6 months.

## 2018-07-13 NOTE — Patient Instructions (Addendum)
Please continue the same metformin.  Please carefully check the blood sugar for a few days after each steroid injection.   Please come back for a follow-up appointment in 6 months.

## 2018-07-15 ENCOUNTER — Encounter: Payer: Medicare Other | Admitting: *Deleted

## 2018-07-15 ENCOUNTER — Ambulatory Visit (HOSPITAL_COMMUNITY): Payer: Medicare Other | Attending: Cardiology

## 2018-07-15 DIAGNOSIS — R072 Precordial pain: Secondary | ICD-10-CM | POA: Diagnosis not present

## 2018-07-15 DIAGNOSIS — I2510 Atherosclerotic heart disease of native coronary artery without angina pectoris: Secondary | ICD-10-CM

## 2018-07-15 DIAGNOSIS — E782 Mixed hyperlipidemia: Secondary | ICD-10-CM | POA: Diagnosis not present

## 2018-07-15 DIAGNOSIS — I10 Essential (primary) hypertension: Secondary | ICD-10-CM | POA: Diagnosis not present

## 2018-07-15 DIAGNOSIS — Z006 Encounter for examination for normal comparison and control in clinical research program: Secondary | ICD-10-CM

## 2018-07-15 LAB — MYOCARDIAL PERFUSION IMAGING
LV dias vol: 57 mL (ref 46–106)
LV sys vol: 28 mL
Peak HR: 110 {beats}/min
Rest HR: 88 {beats}/min
SDS: 0
SRS: 0
SSS: 0
TID: 0.81

## 2018-07-15 MED ORDER — REGADENOSON 0.4 MG/5ML IV SOLN
0.4000 mg | Freq: Once | INTRAVENOUS | Status: AC
  Administered 2018-07-15: 0.4 mg via INTRAVENOUS

## 2018-07-15 MED ORDER — TECHNETIUM TC 99M TETROFOSMIN IV KIT
31.9000 | PACK | Freq: Once | INTRAVENOUS | Status: AC | PRN
  Administered 2018-07-15: 31.9 via INTRAVENOUS
  Filled 2018-07-15: qty 32

## 2018-07-15 MED ORDER — TECHNETIUM TC 99M TETROFOSMIN IV KIT
10.1000 | PACK | Freq: Once | INTRAVENOUS | Status: AC | PRN
  Administered 2018-07-15: 10.1 via INTRAVENOUS
  Filled 2018-07-15: qty 11

## 2018-07-15 NOTE — Research (Signed)
CADFEM Informed Consent                  Subject Name:   Amy Escobar   Subject met inclusion and exclusion criteria.  The informed consent form, study requirements and expectations were reviewed with the subject and questions and concerns were addressed prior to the signing of the consent form.  The subject verbalized understanding of the trial requirements.  The subject agreed to participate in the CADFEM trial and signed the informed consent.  The informed consent was obtained prior to performance of any protocol-specific procedures for the subject.  A copy of the signed informed consent was given to the subject and a copy was placed in the subject's medical record.   [place] Chalmers, Research Assistant 07/15/18   7:04 a.m.

## 2018-07-18 ENCOUNTER — Encounter: Payer: Self-pay | Admitting: Family

## 2018-07-18 ENCOUNTER — Ambulatory Visit (INDEPENDENT_AMBULATORY_CARE_PROVIDER_SITE_OTHER): Payer: Medicare Other | Admitting: Family

## 2018-07-18 VITALS — BP 128/72 | HR 90 | Temp 98.8°F | Ht 64.5 in | Wt 162.0 lb

## 2018-07-18 DIAGNOSIS — B9789 Other viral agents as the cause of diseases classified elsewhere: Secondary | ICD-10-CM | POA: Diagnosis not present

## 2018-07-18 DIAGNOSIS — J069 Acute upper respiratory infection, unspecified: Secondary | ICD-10-CM

## 2018-07-18 MED ORDER — AZITHROMYCIN 250 MG PO TABS: ORAL_TABLET | ORAL | 0 refills | Status: DC

## 2018-07-18 NOTE — Patient Instructions (Signed)
Viral Respiratory Infection  A viral respiratory infection is an illness that affects parts of the body that are used for breathing. These include the lungs, nose, and throat. It is caused by a germ called a virus.  Some examples of this kind of infection are:  · A cold.  · The flu (influenza).  · A respiratory syncytial virus (RSV) infection.  A person who gets this illness may have the following symptoms:  · A stuffy or runny nose.  · Yellow or green fluid in the nose.  · A cough.  · Sneezing.  · Tiredness (fatigue).  · Achy muscles.  · A sore throat.  · Sweating or chills.  · A fever.  · A headache.  Follow these instructions at home:  Managing pain and congestion  · Take over-the-counter and prescription medicines only as told by your doctor.  · If you have a sore throat, gargle with salt water. Do this 3-4 times per day or as needed. To make a salt-water mixture, dissolve ½-1 tsp of salt in 1 cup of warm water. Make sure that all the salt dissolves.  · Use nose drops made from salt water. This helps with stuffiness (congestion). It also helps soften the skin around your nose.  · Drink enough fluid to keep your pee (urine) pale yellow.  General instructions    · Rest as much as possible.  · Do not drink alcohol.  · Do not use any products that have nicotine or tobacco, such as cigarettes and e-cigarettes. If you need help quitting, ask your doctor.  · Keep all follow-up visits as told by your doctor. This is important.  How is this prevented?    · Get a flu shot every year. Ask your doctor when you should get your flu shot.  · Do not let other people get your germs. If you are sick:  ? Stay home from work or school.  ? Wash your hands with soap and water often. Wash your hands after you cough or sneeze. If soap and water are not available, use hand sanitizer.  · Avoid contact with people who are sick during cold and flu season. This is in fall and winter.  Get help if:  · Your symptoms last for 10 days or  longer.  · Your symptoms get worse over time.  · You have a fever.  · You have very bad pain in your face or forehead.  · Parts of your jaw or neck become very swollen.  Get help right away if:  · You feel pain or pressure in your chest.  · You have shortness of breath.  · You faint or feel like you will faint.  · You keep throwing up (vomiting).  · You feel confused.  Summary  · A viral respiratory infection is an illness that affects parts of the body that are used for breathing.  · Examples of this illness include a cold, the flu, and respiratory syncytial virus (RSV) infection.  · The infection can cause a runny nose, cough, sneezing, sore throat, and fever.  · Follow what your doctor tells you about taking medicines, drinking lots of fluid, washing your hands, resting at home, and avoiding people who are sick.  This information is not intended to replace advice given to you by your health care provider. Make sure you discuss any questions you have with your health care provider.  Document Released: 05/28/2008 Document Revised: 07/26/2017 Document Reviewed: 07/26/2017  Elsevier   Interactive Patient Education © 2019 Else[employer] Inc.

## 2018-07-18 NOTE — Progress Notes (Signed)
Amy Escobar is a 73 y.o. female with the following history as recorded in Epic[facility]:  Patient Active Problem List   Diagnosis Date Noted  . Greater trochanteric bursitis of both hips 06/15/2018  . Trigger point of shoulder region, left 02/07/2018  . Hypertension   . Mild pulmonary hypertension ([place])   . NSVT (nonsustained ventricular tachycardia) ([place])   . PVC's (premature ventricular contractions)   . URI (upper respiratory infection) 08/09/2016  . Neck pain 02/10/2016  . Urinary incontinence 02/10/2016  . Degenerative arthritis of knee, bilateral 07/17/2015  . Knee MCL sprain 06/07/2015  . Discomfort in chest 02/15/2015  . Chest pain 02/15/2015  . DM type 2, controlled, with complication ([place])   . Wellness examination 08/06/2014  . Acute meniscal tear of knee 02/13/2014  . Primary localized osteoarthrosis, lower leg 02/13/2014  . Gastrocnemius tear 12/25/2013  . UTI (urinary tract infection) 12/20/2013  . Right knee pain 12/20/2013  . Pulmonary hypertension ([place]) 10/06/2013  . Dyspnea 08/04/2013  . Dysphagia, unspecified(787.20) 08/10/2012  . Hip pain, left 02/02/2012  . Painful respiration 05/25/2011  . Encounter for long-term (current) use of other medications 01/30/2011  . PELVIC PAIN, LEFT 07/30/2010  . TOBACCO USE, QUIT 07/07/2010  . FATIGUE 12/20/2009  . Headache 12/20/2009  . Low back pain 12/26/2008  . Disorder of liver 04/13/2008  . FOOT PAIN, BILATERAL 04/13/2008  . FIBROIDS, UTERUS 11/24/2007  . THYROID NODULE, LEFT 11/24/2007  . HYPERCHOLESTEROLEMIA 11/24/2007  . CARPAL TUNNEL SYNDROME, BILATERAL 11/24/2007  . ALKALINE PHOSPHATASE, ELEVATED 11/24/2007  . Gout 08/10/2007  . ANXIETY 08/10/2007  . Essential hypertension 08/10/2007  . Cough 08/01/2007  . Allergic rhinitis 01/14/2007  . GERD 01/14/2007  . Osteoporosis 01/14/2007    Current Outpatient Medications  Medication Sig Dispense Refill  . amLODipine (NORVASC) 2.5 MG tablet Take 1 tablet (2.5  mg total) by mouth daily. 90 tablet 2  . aspirin EC 81 MG tablet Take 81 mg by mouth daily.    . cetirizine (ZYRTEC) 10 MG tablet Take 10 mg by mouth daily.    Marland Kitchen esomeprazole (NEXIUM) 40 MG capsule TAKE ONE CAPSULE BY MOUTH DAILY BEFORE BREAKFAST 90 capsule 0  . fesoterodine (TOVIAZ) 4 MG TB24 tablet Take 4 mg daily by mouth.    . gabapentin (NEURONTIN) 600 MG tablet TAKE ONE TABLET BY MOUTH THREE TIMES A DAY 270 tablet 8  . hydrochlorothiazide (MICROZIDE) 12.5 MG capsule Take 1 capsule (12.5 mg total) by mouth daily. 90 capsule 1  . metFORMIN (GLUCOPHAGE-XR) 500 MG 24 hr tablet TAKE TWO TABLETS BY MOUTH TWICE A DAY 360 tablet 0  . ONE TOUCH ULTRA TEST test strip USE STRIP TO CHECK BLOOD SUGAR DAILY 100 each 1  . rosuvastatin (CRESTOR) 5 MG tablet TAKE ONE TABLET BY MOUTH DAILY 90 tablet 1  . triamcinolone cream (KENALOG) 0.1 % APPLY 1 APPLICATION TOPICALLY 4 (FOUR) TIMES DAILY AS NEEDED FOR RASH 45 g 1  . UNKNOWN TO PATIENT Inject as directed every 6 (six) months. 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 valsartan (DIOVAN) 80 MG tablet Take 1 tablet (80 mg total) by mouth daily. 90 tablet 1  . Vitamin D, Ergocalciferol, (DRISDOL) 1.25 MG (50000 UT) CAPS capsule TAKE ONE CAPSULE BY MOUTH ONCE WEEKLY 12 capsule 0   No current facility-administered medications for this visit.     Allergies: Olmesartan medoxomil  Past Medical History:  Diagnosis Date  . Alkaline phosphatase deficiency    w/u Ne  .  Allergic rhinitis   . Anxiety   . Arthritis   . DM2 (diabetes mellitus, type 2) ([place])   . GERD (gastroesophageal reflux disease)   . Gout   . Hemorrhoids   . Hyperlipidemia   . Hypertension   . Mild pulmonary hypertension ([place])   . NSVT (nonsustained ventricular tachycardia) ([place])   . Osteoporosis   . PVC's (premature ventricular contractions)   . Thyroid nodule    small  . Uterine fibroid     Past Surgical History:  Procedure Laterality Date  . CATARACT EXTRACTION  Bilateral 2016  . COLONOSCOPY  2007  . echocardiogram (other)  01/16/2002  . 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 stress cardiolite  02/12/2006  . TOENAIL EXCISION    . TUBAL LIGATION      Family History  Problem Relation Age of Onset  . Heart disease Mother   . Heart disease Father   . Heart disease Maternal Grandfather   . Rectal cancer Maternal Grandfather   . Stomach cancer Maternal Grandmother   . Colon cancer Neg Hx   . Breast cancer Neg Hx     Social History   Tobacco Use  . Smoking status: Former Smoker    Packs/day: 0.25    Years: 5.00    Pack years: 1.25    Types: Cigarettes    Last attempt to quit: 06/29/1968    Years since quitting: 50.0  . Smokeless tobacco: Never Used  Substance Use Topics  . Alcohol use: No    Alcohol/week: 0.0 standard drinks    Subjective:  Started Friday with flu- like symptoms- really seemed to get worse on  Saturday; + cough/ chills; wanted to make sure she did not have the flu; taking OTC Thera-flu and Robitussin for symptom relief;      Objective:  Vitals:   07/18/18 1520  BP: 128/72  Pulse: 90  Temp: 98.8 F (37.1 C)  TempSrc: Oral  SpO2: 98%  Weight: 162 lb (73.5 kg)  Height: 5' 4.5" (1.638 m)    General: Well developed, well nourished, in no acute distress  Skin : Warm and dry.  Head: Normocephalic and atraumatic  Eyes: Sclera and conjunctiva clear; pupils round and reactive to light; extraocular movements intact  Ears: External normal; canals clear; tympanic membranes normal  Oropharynx: Pink, supple. No suspicious lesions  Neck: Supple without thyromegaly, adenopathy  Lungs: Respirations unlabored; clear to auscultation bilaterally without wheeze, rales, rhonchi  CVS exam: normal rate and regular rhythm.  Abdomen: Soft; nontender; nondistended; normoactive bowel sounds; no masses or hepatosplenomegaly  Musculoskeletal: No deformities; no active joint inflammation  Extremities: No edema, cyanosis, clubbing   Vessels: Symmetric bilaterally  Neurologic: Alert and oriented; speech intact; face symmetrical; moves all extremities well; CNII-XII intact without focal deficit   Assessment:  1. Viral URI with cough     Plan:  Rapid flu in office is negative; per patient, symptoms are improving; continue OTC medications, symptomatic treatment; Rx for Z-pak to hold and fill only if worsening in the next 48 hours.   No follow-ups on file.  No orders of the defined types were placed in this encounter.   Requested Prescriptions    No prescriptions requested or ordered in this encounter

## 2018-07-22 ENCOUNTER — Other Ambulatory Visit: Payer: Self-pay

## 2018-07-22 ENCOUNTER — Telehealth: Payer: Self-pay | Admitting: Endocrinology

## 2018-07-22 MED ORDER — GLUCOSE BLOOD VI STRP: ORAL_STRIP | 1 refills | Status: DC

## 2018-07-22 NOTE — Telephone Encounter (Signed)
ONE TOUCH ULTRA TEST test strip   Patient stated that the pharmacy would be sending over a refill request for her test strips.      Amy Escobar Friend[facility] [address] [place]

## 2018-07-22 NOTE — Telephone Encounter (Signed)
Refill sent.

## 2018-07-25 ENCOUNTER — Telehealth: Payer: Self-pay | Admitting: Family

## 2018-07-25 NOTE — Telephone Encounter (Signed)
Messages are confusing- did she get better on the Z-pak or no improvement?

## 2018-07-25 NOTE — Telephone Encounter (Signed)
Copied from [place] [phone]. Topic: Quick Communication - See Telephone Encounter >> Jul 25, 2018  8:50 AM Loma Boston wrote: CRM for notification. See Telephone encounter for: 07/25/18.azithromycin [facility]) 250 MG tablet  was called in on 1/20 seemed to be working well, wants to try a refill still has congestion or have Murray's nurse call her to discuss what is going on Pharmacy still Kendrick Fries at fax [phone]

## 2018-07-25 NOTE — Telephone Encounter (Signed)
LOV on 07/18/18 with Laura,NP. Returned call to pt who states that she is still having nasal drainage with coughing. Pt states that the drainage is clear and she is also coughing up clear sputum. Pt states she started to feel aches and pains this morning. Pt states she finished the course of Azithromycin on yesterday and wants to know if she can have a refill of the medication due to no improvement in current symptoms.

## 2018-07-26 MED ORDER — AZITHROMYCIN 250 MG PO TABS: ORAL_TABLET | ORAL | 0 refills | Status: DC

## 2018-07-26 NOTE — Telephone Encounter (Signed)
Called patient back and left message for her to return call to clinic to clarify if Zpack helped or didn't help.

## 2018-07-26 NOTE — Telephone Encounter (Signed)
Sent in second Z-pak as she originally requested; she will need to be seen if the symptoms persist.

## 2018-07-26 NOTE — Addendum Note (Signed)
Addended by: Sherlene Shams on: 07/26/2018 10:55 AM   Modules accepted: Orders

## 2018-07-26 NOTE — Telephone Encounter (Signed)
Spoke with patient and info given 

## 2018-07-26 NOTE — Telephone Encounter (Signed)
Pt called back and stated that she was getting better until she finished the medication. Pt states that when she stooped symptoms returned. Pt would like a call back. Please advise [phone].

## 2018-07-29 DIAGNOSIS — N130 Hydronephrosis with ureteropelvic junction obstruction: Secondary | ICD-10-CM | POA: Diagnosis not present

## 2018-07-29 DIAGNOSIS — R350 Frequency of micturition: Secondary | ICD-10-CM | POA: Diagnosis not present

## 2018-07-29 DIAGNOSIS — R3914 Feeling of incomplete bladder emptying: Secondary | ICD-10-CM | POA: Diagnosis not present

## 2018-08-02 ENCOUNTER — Encounter: Payer: Self-pay | Admitting: Internal Medicine

## 2018-08-02 ENCOUNTER — Ambulatory Visit (INDEPENDENT_AMBULATORY_CARE_PROVIDER_SITE_OTHER): Payer: Medicare Other | Admitting: Internal Medicine

## 2018-08-02 DIAGNOSIS — R059 Cough, unspecified: Secondary | ICD-10-CM

## 2018-08-02 DIAGNOSIS — R05 Cough: Secondary | ICD-10-CM | POA: Diagnosis not present

## 2018-08-02 MED ORDER — PREDNISONE 20 MG PO TABS: 40.0000 mg | ORAL_TABLET | Freq: Every day | ORAL | 0 refills | Status: DC

## 2018-08-02 NOTE — Progress Notes (Signed)
   Subjective:   Patient ID: Amy Escobar, female    DOB: 11-Apr-1946, 73 y.o.   MRN: [id number]  HPI The patient is a 73 y.o. female coming in for cold symptoms. Started about 2-3 weeks ago. She was seen for this about 14 days ago and given z-pack. She took this and was feeling some better. Then she started feeling worse again and got another z-pack which she took. She is not feeling any improvement still. Main symptoms are: SOB, cough, sinus drainage and pressure. Denies fevers or chills. Overall it is stable. Has tried zyrtec once but not taking daily. She is also having some swelling around her right eye recently and used warm compress on it and it has come down some. Mild pain to touch. Vision normal.   Review of Systems  Constitutional: Positive for activity change and appetite change. Negative for chills, fatigue, fever and unexpected weight change.  HENT: Positive for congestion, postnasal drip, rhinorrhea and sinus pressure. Negative for ear discharge, ear pain, sinus pain, sneezing, sore throat, tinnitus, trouble swallowing and voice change.   Eyes: Positive for redness.       Right eyelid swelling  Respiratory: Positive for cough and shortness of breath. Negative for chest tightness and wheezing.   Cardiovascular: Negative.   Gastrointestinal: Negative.   Musculoskeletal: Positive for myalgias.  Neurological: Negative.     Objective:  Physical Exam Constitutional:      Appearance: She is well-developed.  HENT:     Head: Normocephalic and atraumatic.     Comments: Oropharynx with redness and clear drainage, nose with swollen turbinates, TMs normal bilaterally.  Eyes:     Extraocular Movements: Extraocular movements intact.     Pupils: Pupils are equal, round, and reactive to light.     Comments: Right eyelid with some swelling in comparison to left, not obstructing the eye and vision intact and all CN working.   Neck:     Musculoskeletal: Normal range of motion.   Thyroid: No thyromegaly.  Cardiovascular:     Rate and Rhythm: Normal rate and regular rhythm.  Pulmonary:     Effort: Pulmonary effort is normal. No respiratory distress.     Breath sounds: Normal breath sounds. No wheezing or rales.  Abdominal:     Palpations: Abdomen is soft.  Musculoskeletal:        General: Tenderness present.  Lymphadenopathy:     Cervical: No cervical adenopathy.  Skin:    General: Skin is warm and dry.  Neurological:     Mental Status: She is alert and oriented to person, place, and time.     Vitals:   08/02/18 1345  BP: 116/72  Pulse: 91  Temp: 98.1 F (36.7 C)  TempSrc: Oral  SpO2: 96%  Weight: 162 lb (73.5 kg)  Height: 5' 4.5" (1.638 m)    Assessment & Plan:

## 2018-08-02 NOTE — Patient Instructions (Signed)
We have sent in the prednisone to take 2 pills each day for 5 days.   This should help. I would recommend to start taking the generic zyrtec daily also for the next couple of weeks.

## 2018-08-03 NOTE — Assessment & Plan Note (Signed)
Suspect viral and is treated with 2 z-pack so no further antibiotics are indicated. Lungs clear. Rx for prednisone to help with sinus congestion and pressure. Advised to start taking zyrtec daily.

## 2018-08-16 ENCOUNTER — Ambulatory Visit: Payer: Medicare Other | Admitting: Neurology

## 2018-08-16 ENCOUNTER — Encounter: Payer: Self-pay | Admitting: Neurology

## 2018-08-16 VITALS — BP 107/67 | HR 96 | Ht 64.5 in | Wt 165.0 lb

## 2018-08-16 DIAGNOSIS — I472 Ventricular tachycardia: Secondary | ICD-10-CM

## 2018-08-16 DIAGNOSIS — R0689 Other abnormalities of breathing: Secondary | ICD-10-CM

## 2018-08-16 DIAGNOSIS — R519 Headache, unspecified: Secondary | ICD-10-CM

## 2018-08-16 DIAGNOSIS — G4719 Other hypersomnia: Secondary | ICD-10-CM | POA: Diagnosis not present

## 2018-08-16 DIAGNOSIS — R51 Headache: Secondary | ICD-10-CM | POA: Diagnosis not present

## 2018-08-16 DIAGNOSIS — R351 Nocturia: Secondary | ICD-10-CM

## 2018-08-16 DIAGNOSIS — R0683 Snoring: Secondary | ICD-10-CM | POA: Diagnosis not present

## 2018-08-16 DIAGNOSIS — I4729 Other ventricular tachycardia: Secondary | ICD-10-CM

## 2018-08-16 DIAGNOSIS — E663 Overweight: Secondary | ICD-10-CM

## 2018-08-16 NOTE — Patient Instructions (Signed)

## 2018-08-16 NOTE — Progress Notes (Signed)
Subjective:    Patient ID: Amy Escobar is a 73 y.o. female.  HPI     Star Age, MD, PhD [facility] [address], [address] [place], [place] [address]  Dear Mickel Baas,   I saw your patient, Amy Escobar, upon your kind request in the sleep clinic today for initial consultation of her sleep disorder, in particular, concern for underlying obstructive sleep apnea. The patient is unaccompanied today. As you know, [name] status Mccrystal is a 73 year old right-handed woman with an underlying complex medical history of degenerative arthritis, diabetes, pulmonary hypertension, hypertension, NSVT, PVCs, hyperlipidemia, gout, anxiety, allergic rhinitis, osteoporosis, reflux disease, and overweight state, who reports snoring and excessive daytime somnolence. I reviewed your office note from 07/04/2018, which you kindly included. Her Epworth sleepiness score is 11 out of 24, fatigue score is 41 out of 63. She is widowed and lives alone, she has 2 grown children. She quit smoking in 1987-10-16 and does not utilize any alcohol and does not drink caffeine on a regular basis, decaff coffee. She has a variable BT, may be in bed around 8 PM to watch TV, and likes to watch crime shows, TV stays on all night. Husband passed away in 2008/10/15. She has no pets. She has one daughter and one son. She is a retired Print production planner. She has no family history of OSA. She has woken herself up with a sense of gasping at times and from her own snoring. She has nocturia about once or twice per average night, has occasional morning headache but no prior history of recurrent headaches or migraines. She does not typically take any medication for her headache with the exception of occasional Tylenol. She has bilateral knee pain and gets injections into her knees.  Her Past Medical History Is Significant For: Past Medical History:  Diagnosis Date  . Alkaline phosphatase deficiency    w/u Ne  .  Allergic rhinitis   . Anxiety   . Arthritis   . DM2 (diabetes mellitus, type 2) ([place])   . GERD (gastroesophageal reflux disease)   . Gout   . Hemorrhoids   . Hyperlipidemia   . Hypertension   . Mild pulmonary hypertension ([place])   . NSVT (nonsustained ventricular tachycardia) ([place])   . Osteoporosis   . PVC's (premature ventricular contractions)   . Thyroid nodule    small  . Uterine fibroid     Her Past Surgical History Is Significant For: Past Surgical History:  Procedure Laterality Date  . CATARACT EXTRACTION Bilateral 2014-10-16  . COLONOSCOPY  10-15-05  . echocardiogram (other)  01/16/2002  . 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 stress cardiolite  02/12/2006  . TOENAIL EXCISION    . TUBAL LIGATION      Her Family History Is Significant For: Family History  Problem Relation Age of Onset  . Heart disease Mother   . Heart disease Father   . Heart disease Maternal Grandfather   . Rectal cancer Maternal Grandfather   . Stomach cancer Maternal Grandmother   . Colon cancer Neg Hx   . Breast cancer Neg Hx     Her Social History Is Significant For: Social History   Socioeconomic History  . Marital status: Widowed    Spouse name: Not on file  . Number of children: 2  . Years of education: Not on file  . Highest education level: Not on file  Occupational History  . Occupation: Proofreader  Employer: UNEMPLOYED  Social Needs  . Financial resource strain: Not on file  . Food insecurity:    Worry: Not on file    Inability: Not on file  . Transportation needs:    Medical: Not on file    Non-medical: Not on file  Tobacco Use  . Smoking status: Former Smoker    Packs/day: 0.25    Years: 5.00    Pack years: 1.25    Types: Cigarettes    Last attempt to quit: 06/29/1968    Years since quitting: 50.1  . Smokeless tobacco: Never Used  Substance and Sexual Activity  . Alcohol use: No    Alcohol/week: 0.0 standard drinks  . Drug use: No  . Sexual activity: Not on file   Lifestyle  . Physical activity:    Days per week: Not on file    Minutes per session: Not on file  . Stress: Not on file  Relationships  . Social connections:    Talks on phone: Not on file    Gets together: Not on file    Attends religious service: Not on file    Active member of club or organization: Not on file    Attends meetings of clubs or organizations: Not on file    Relationship status: Not on file  Other Topics Concern  . Not on file  Social History Narrative   Widowed 2010.     Her Allergies Are:  Allergies  Allergen Reactions  . Olmesartan Medoxomil     REACTION: headache  :   Her Current Medications Are:  Outpatient Encounter Medications as of 08/16/2018  Medication Sig  . amLODipine (NORVASC) 2.5 MG tablet Take 1 tablet (2.5 mg total) by mouth daily.  Marland Kitchen aspirin EC 81 MG tablet Take 81 mg by mouth daily.  . cetirizine (ZYRTEC) 10 MG tablet Take 10 mg by mouth daily.  . Chlorphen-Pseudoephed-APAP (CORICIDIN D PO) Take by mouth.  . esomeprazole (NEXIUM) 40 MG capsule TAKE ONE CAPSULE BY MOUTH DAILY BEFORE BREAKFAST  . fesoterodine (TOVIAZ) 4 MG TB24 tablet Take 4 mg daily by mouth.  . gabapentin (NEURONTIN) 600 MG tablet TAKE ONE TABLET BY MOUTH THREE TIMES A DAY  . glucose blood (ONE TOUCH ULTRA TEST) test strip USE STRIP TO CHECK BLOOD SUGAR DAILY  . hydrochlorothiazide (MICROZIDE) 12.5 MG capsule Take 1 capsule (12.5 mg total) by mouth daily.  . metFORMIN (GLUCOPHAGE-XR) 500 MG 24 hr tablet TAKE TWO TABLETS BY MOUTH TWICE A DAY  . rosuvastatin (CRESTOR) 5 MG tablet TAKE ONE TABLET BY MOUTH DAILY  . triamcinolone cream (KENALOG) 0.1 % APPLY 1 APPLICATION TOPICALLY 4 (FOUR) TIMES DAILY AS NEEDED FOR RASH  . UNKNOWN TO PATIENT Inject as directed every 6 (six) months. 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 valsartan (DIOVAN) 80 MG tablet Take 1 tablet (80 mg total) by mouth daily.  . Vitamin D, Ergocalciferol, (DRISDOL) 1.25 MG (50000 UT) CAPS capsule  TAKE ONE CAPSULE BY MOUTH ONCE WEEKLY  . [DISCONTINUED] predniSONE (DELTASONE) 20 MG tablet Take 2 tablets (40 mg total) by mouth daily with breakfast.   No facility-administered encounter medications on file as of 08/16/2018.   :  Review of Systems:  Out of a complete 14 point review of systems, all are reviewed and negative with the exception of these symptoms as listed below: Review of Systems  Neurological:       Pt presents today to discuss her sleep. Pt has never had a sleeps  study but does endorse snoring.  Epworth Sleepiness Scale 0= would never doze 1= slight chance of dozing 2= moderate chance of dozing 3= high chance of dozing  Sitting and reading: 3 Watching TV: 3 Sitting inactive in a public place (ex. Theater or meeting): 1 As a passenger in a car for an hour without a break: 1 Lying down to rest in the afternoon: 2 Sitting and talking to someone: 0 Sitting quietly after lunch (no alcohol): 1 In a car, while stopped in traffic: 0 Total: 11     Objective:  Neurological Exam  Physical Exam Physical Examination:   Vitals:   08/16/18 0841  BP: 107/67  Pulse: 96   General Examination: The patient is a very pleasant 73 y.o. female in no acute distress. She appears well-developed and well-nourished and well groomed. Wearing a mask d/t lingering cold symptoms.   HEENT: Normocephalic, atraumatic, pupils are equal, round and reactive to light and accommodation. Extraocular tracking is good without limitation to gaze excursion or nystagmus noted. Normal smooth pursuit is noted. Hearing is grossly intact. Tympanic membranes are clear bilaterally. Face is symmetric with normal facial animation and normal facial sensation. Speech is clear with no dysarthria noted. There is no hypophonia. There is no lip, neck/head, jaw or voice tremor. Neck is supple with full range of passive and active motion. There are no carotid bruits on auscultation. Oropharynx exam reveals: mild mouth  dryness, adequate dental hygiene and mild airway crowding, due to wider uvula, tonsils in place. Mallampati is class II. Tongue protrudes centrally and palate elevates symmetrically. Tonsils are 1+ in size. Neck size is 14.5 inches.   Chest: Clear to auscultation without wheezing, rhonchi or crackles noted.  Heart: S1+S2+0, regular and normal without murmurs, rubs or gallops noted.   Abdomen: Soft, non-tender and non-distended with normal bowel sounds appreciated on auscultation.  Extremities: There is no pitting edema in the distal lower extremities bilaterally. Pedal pulses are intact.  Skin: Warm and dry without trophic changes noted.  Musculoskeletal: exam reveals no obvious joint deformities, tenderness or joint swelling or erythema.   Neurologically:  Mental status: The patient is awake, alert and oriented in all 4 spheres. Her immediate and remote memory, attention, language skills and fund of knowledge are appropriate. There is no evidence of aphasia, agnosia, apraxia or anomia. Speech is clear with normal prosody and enunciation. Thought process is linear. Mood is normal and affect is normal.  Cranial nerves II - XII are as described above under HEENT exam. In addition: shoulder shrug is normal with equal shoulder height noted. Motor exam: Normal bulk, strength and tone is noted. There is no drift, tremor or rebound. Romberg is negative. Fine motor skills and coordination: intact with normal finger taps, normal hand movements, normal rapid alternating patting, normal foot taps and normal foot agility.  Cerebellar testing: No dysmetria or intention tremo. There is no truncal or gait ataxia.  Sensory exam: intact to light touch.  Gait, station and balance: She stands easily. No veering to one side is noted. No leaning to one side is noted. Posture is age-appropriate and stance is narrow based. Gait shows normal stride length and normal pace. No problems turning are noted. Tandem walk is  challenging for her.                Assessment and Plan:   In summary, Amy Escobar is a very pleasant 73 y.o.-year old female  with an underlying complex medical history of degenerative  arthritis, diabetes, pulmonary hypertension, hypertension, NSVT, PVCs, hyperlipidemia, gout, anxiety, allergic rhinitis, osteoporosis, reflux disease, and overweight state, whose history and physical exam are concerning for obstructive sleep apnea (OSA). I had a long chat with the patient about my findings and the diagnosis of OSA, its prognosis and treatment options. We talked about medical treatments, surgical interventions and non-pharmacological approaches. I explained in particular the risks and ramifications of untreated moderate to severe OSA, especially with respect to developing cardiovascular disease down the Road, including congestive heart failure, difficult to treat hypertension, cardiac arrhythmias, or stroke. Even type 2 diabetes has, in part, been linked to untreated OSA. Symptoms of untreated OSA include daytime sleepiness, memory problems, mood irritability and mood disorder such as depression and anxiety, lack of energy, as well as recurrent headaches, especially morning headaches. We talked about trying to maintain a healthy lifestyle in general, as well as the importance of weight control. I encouraged the patient to eat healthy, exercise daily and keep well hydrated, to keep a scheduled bedtime and wake time routine, to not skip any meals and eat healthy snacks in between meals. I advised the patient not to drive when feeling sleepy. I recommended the following at this time: sleep study with potential positive airway pressure titration. (We will score hypopneas at 4%).   I explained the sleep test procedure to the patient and also outlined possible surgical and non-surgical treatment options of OSA, including the use of a custom-made dental device (which would require a referral to a specialist  dentist or oral surgeon), upper airway surgical options, such as pillar implants, radiofrequency surgery, tongue base surgery, and UPPP (which would involve a referral to an ENT surgeon). Rarely, jaw surgery such as mandibular advancement may be considered.  I also explained the CPAP treatment option to the patient, who indicated that she would be willing to try CPAP if the need arises. I explained the importance of being compliant with PAP treatment, not only for insurance purposes but primarily to improve Her symptoms, and for the patient's long term health benefit, including to reduce Her cardiovascular risks. I answered all her questions today and the patient was in agreement. I plan to see her back after the sleep study is completed and encouraged her to call with any interim questions, concerns, problems or updates.   Thank you very much for allowing me to participate in the care of this nice patient. If I can be of any further assistance to you please do not hesitate to call me at [phone].  Sincerely,   Star Age, MD, PhD

## 2018-08-22 ENCOUNTER — Encounter: Payer: Self-pay | Admitting: Internal Medicine

## 2018-08-22 ENCOUNTER — Ambulatory Visit (INDEPENDENT_AMBULATORY_CARE_PROVIDER_SITE_OTHER)
Admission: RE | Admit: 2018-08-22 | Discharge: 2018-08-22 | Disposition: A | Discharge status: Home / Self Care | Payer: Medicare Other | Source: Ambulatory Visit | Attending: Internal Medicine | Admitting: Internal Medicine

## 2018-08-22 ENCOUNTER — Ambulatory Visit (INDEPENDENT_AMBULATORY_CARE_PROVIDER_SITE_OTHER): Payer: Medicare Other | Admitting: Internal Medicine

## 2018-08-22 VITALS — BP 114/60 | HR 99 | Ht 64.5 in | Wt 162.6 lb

## 2018-08-22 DIAGNOSIS — R058 Other specified cough: Secondary | ICD-10-CM

## 2018-08-22 DIAGNOSIS — R05 Cough: Secondary | ICD-10-CM

## 2018-08-22 LAB — CBC WITH DIFFERENTIAL/PLATELET
BASOS ABS: 0.1 10*3/uL (ref 0.0–0.1)
Basophils Relative: 0.8 % (ref 0.0–3.0)
Eosinophils Absolute: 0.2 10*3/uL (ref 0.0–0.7)
Eosinophils Relative: 3.4 % (ref 0.0–5.0)
HCT: 38.3 % (ref 36.0–46.0)
Hemoglobin: 12.6 g/dL (ref 12.0–15.0)
Lymphocytes Relative: 30.1 % (ref 12.0–46.0)
Lymphs Abs: 2.2 10*3/uL (ref 0.7–4.0)
MCHC: 32.9 g/dL (ref 30.0–36.0)
MCV: 86 fl (ref 78.0–100.0)
MONO ABS: 0.9 10*3/uL (ref 0.1–1.0)
Monocytes Relative: 11.7 % (ref 3.0–12.0)
Neutro Abs: 3.9 10*3/uL (ref 1.4–7.7)
Neutrophils Relative %: 54 % (ref 43.0–77.0)
Platelets: 244 10*3/uL (ref 150.0–400.0)
RBC: 4.46 Mil/uL (ref 3.87–5.11)
RDW: 14.9 % (ref 11.5–15.5)
WBC: 7.3 10*3/uL (ref 4.0–10.5)

## 2018-08-22 MED ORDER — BENZONATATE 200 MG PO CAPS: 200.0000 mg | ORAL_CAPSULE | Freq: Three times a day (TID) | ORAL | 2 refills | Status: DC | PRN

## 2018-08-22 MED ORDER — ESOMEPRAZOLE MAGNESIUM 40 MG PO CPDR: DELAYED_RELEASE_CAPSULE | ORAL | 2 refills | Status: DC

## 2018-08-22 NOTE — Progress Notes (Signed)
Spoke with pt and notified of results per Dr. Wert. Pt verbalized understanding and denied any questions. 

## 2018-08-22 NOTE — Assessment & Plan Note (Addendum)
Onset was around 2013  -  Neg egd by [facility] 09/06/12  - Allergy profile 08/22/2018 >  Eos 0.2 /  IgE   - 08/22/2018  gabapentin changed from 600 at hs to 300 mg each am and 600 at bedtime    The most common causes of chronic cough in immunocompetent adults include the following: upper airway cough syndrome (UACS), previously referred to as postnasal drip syndrome (PNDS), which is caused by variety of rhinosinus conditions; (2) asthma; (3) GERD; (4) chronic bronchitis from cigarette smoking or other inhaled environmental irritants; (5) nonasthmatic eosinophilic bronchitis; and (6) bronchiectasis.   These conditions, singly or in combination, have accounted for up to 94% of the causes of chronic cough in prospective studies.   Other conditions have constituted no >6% of the causes in prospective studies These have included bronchogenic carcinoma, chronic interstitial pneumonia, sarcoidosis, left ventricular failure, ACEI-induced cough, and aspiration from a condition associated with pharyngeal dysfunction.    Chronic cough is often simultaneously caused by more than one condition. A single cause has been found from 38 to 82% of the time, multiple causes from 18 to 62%. Multiply caused cough has been the result of three diseases up to 42% of the time.       This duration of cough assoc with urge to clear throat even when "better" is most c/w Upper airway cough syndrome (previously labeled PNDS),  is so named because it's frequently impossible to sort out how much is  CR/sinusitis with freq throat clearing (which can be related to primary GERD)   vs  causing  secondary (" extra esophageal")  GERD from wide swings in gastric pressure that occur with throat clearing, often  promoting self use of mint and menthol lozenges that reduce the lower esophageal sphincter tone and exacerbate the problem further in a cyclical fashion.   These are the same pts (now being labeled as having "irritable larynx syndrome" by  some cough centers) who not infrequently have a history of having failed to tolerate ace inhibitors,  dry powder inhalers or biphosphonates or report having atypical/extraesophageal reflux symptoms that don't respond to standard doses of PPI  and are easily confused as having aecopd or asthma flares by even experienced allergists/ pulmonologists (myself included).     Of the three most common causes of  Sub-acute / recurrent or chronic cough, only one (GERD)  can actually contribute to/ trigger  the other two (asthma and post nasal drip syndrome)  and perpetuate the cylce of cough.  While not intuitively obvious, many patients with chronic low grade reflux do not cough until there is a primary insult that disturbs the protective epithelial barrier and exposes sensitive nerve endings.   This is typically viral but can due to PNDS and  either may apply here.   The point is that once this occurs, it is difficult to eliminate the cycle  using anything but a maximally effective acid suppression regimen at least in the short run, accompanied by an appropriate diet to address non acid GERD and control / eliminate the cough itself with tessalon 200 in short term and for long term  titrating gabapentin up to 300 mg in am and 600 mg in pm until returns in 4 weeks to regroup.   Reviewed:  The standardized cough guidelines published in Chest by Lissa Morales in 2006 are still the best available and consist of a multiple step process (up to 12!) , not a single office visit,  and  are intended  to address this problem logically,  with an alogrithm dependent on response to empiric treatment at  each progressive step  to determine a specific diagnosis with  minimal addtional testing needed. Therefore if adherence is an issue or can't be accurately verified,  it's very unlikely the standard evaluation and treatment will be successful here.    Furthermore, response to therapy (other than acute cough suppression, which  should only be used short term with avoidance of narcotic containing cough syrups if possible), can be a gradual process for which the patient is not likely to  perceive immediate benefit.  Unlike going to an eye doctor where the best perscription is almost always the first one and is immediately effective, this is almost never the case in the management of chronic cough syndromes. Therefore the patient needs to commit up front to consistently adhere to recommendations  for up to 6 weeks of therapy directed at the likely underlying problem(s) before the response can be reasonably evaluated.      Total time devoted to counseling  > 50 % of initial 60 min office visit:  review case with pt/ discussion of options/alternatives/ personally creating written customized instructions  in presence of pt  then going over those specific  Instructions directly with the pt including how to use all of the meds but in particular covering each new medication in detail and the difference between the maintenance= "automatic" meds and the prns using an action plan format for the latter (If this problem/symptom => do that organization reading Left to right).  Please see AVS from this visit for a full list of these instructions which I personally wrote for this pt and  are unique to this visit.

## 2018-08-22 NOTE — Patient Instructions (Addendum)
Increase you nexium to 40 mg Take 30- 60 min before your first and last meals of the day   Increase your gabapentin to 300 mg each am and continue 600 mg at bedtime   For cough as needed >  Tessalon 200 mg up to every 6 hours and the goal is no coughing at all    GERD (REFLUX)  is an extremely common cause of respiratory symptoms just like yours , many times with no obvious heartburn at all.    It can be treated with medication, but also with lifestyle changes including elevation of the head of your bed (ideally with 6 -8inch blocks under the headboard of your bed),  Smoking cessation, avoidance of late meals, excessive alcohol, and avoid fatty foods, chocolate, peppermint, colas, red wine, and acidic juices such as orange juice.  NO MINT OR MENTHOL PRODUCTS SO NO COUGH DROPS  USE SUGARLESS CANDY INSTEAD (Jolley ranchers or Stover's or Life Savers) or even ice chips will also do - the key is to swallow to prevent all throat clearing. NO OIL BASED VITAMINS - use powdered substitutes.  Avoid fish oil when coughing.    Please remember to go to the lab and x-ray department   for your tests - we will call you with the results when they are available.     Please schedule a follow up office visit in 4 weeks, sooner if needed  with all medications /inhalers/ solutions in hand so we can verify exactly what you are taking. This includes all medications from all doctors and over the counters Add:  ? Needs hrct next as may have early ILD

## 2018-08-22 NOTE — Progress Notes (Signed)
Amy Escobar, female    DOB: 06-30-1945    MRN: [id number]   Brief patient profile:  40 yobf quit smoking for good 1980s with h/o GERD going back to at least 2010 with neg egd by Amy Escobar 09/06/12   And some tendency for colds to settle in sinuses and tendency for pnds req daily zyrtec x 2018 - was eval in pulmonary clinic with nl pfts/unexplained doe and cough attributed to uacs and started on gabapentin but did not return p 09/12/13 with  recs  As follows:  Increase neurontin to 300 mg  three times a day to see if helps your cough  If still have drainage a recommend a trial of zyrtec 10 mg daily and possible an allergy evaluation   Improved p this eval and req gabapentin up to 600 tid for dm neuropathy but adjusted this down on her own due to excess dayime drowsiness at only taking 600 mg at when self referred back to pulmonary clinic 08/22/2018 due to cough since mid Jan 2020 s/p 2 zpaks     History of Present Illness  08/22/2018  Pulmonary/ 1st office eval/Amy Escobar  Chief Complaint  Patient presents with  . Pulmonary Consult    Self referral. Pt c/o cough x 5 wks- currently prod with clear sputum.   Dyspnea:  Does flat and slow = MMRC2 = can't walk a nl pace on a flat grade s sob but does fine slow and flat also limited by knees  Cough: urge to clear throat x 2013 / pred did not help / daytime aggravated by voice use  Sleep: on one pillow / bed is flat sometimes wakes her up / SABA use: none     No obvious day to day or daytime variability or assoc  purulent sputum or mucus plugs or hemoptysis or cp or chest tightness, subjective wheeze or overt sinus or hb symptoms.     Also denies any obvious fluctuation of symptoms with weather or environmental changes or other aggravating or alleviating factors except as outlined above   No unusual exposure hx or h/o childhood pna/ asthma or knowledge of premature birth.  Current Allergies, Complete Past Medical History, Past Surgical History,  Family History, and Social History were reviewed in [employer] record.  ROS  The following are not active complaints unless bolded Hoarseness, sore throat, dysphagia, dental problems, itching, sneezing,  nasal congestion or discharge of excess mucus or purulent secretions, ear ache,   fever, chills, sweats, unintended wt loss or wt gain, classically pleuritic or exertional cp,  orthopnea pnd or arm/hand swelling  or leg swelling, presyncope, palpitations, abdominal pain, anorexia, nausea, vomiting, diarrhea  or change in bowel habits or change in bladder habits, change in stools or change in urine, dysuria, hematuria,  rash, arthralgias, visual complaints, headache aggravated by coughing, numbness, weakness or ataxia or problems with walking or coordination,  change in mood or  memory.          Past Medical History:  Diagnosis Date  . Alkaline phosphatase deficiency    w/u Ne  . Allergic rhinitis   . Anxiety   . Arthritis   . DM2 (diabetes mellitus, type 2) ([place])   . GERD (gastroesophageal reflux disease)   . Gout   . Hemorrhoids   . Hyperlipidemia   . Hypertension   . Mild pulmonary hypertension ([place])   . NSVT (nonsustained ventricular tachycardia) ([place])   . Osteoporosis   . PVC's (premature ventricular contractions)   .  Thyroid nodule    small  . Uterine fibroid     Outpatient Medications Prior to Visit  Medication Sig Dispense Refill  . amLODipine (NORVASC) 2.5 MG tablet Take 1 tablet (2.5 mg total) by mouth daily. 90 tablet 2  . aspirin EC 81 MG tablet Take 81 mg by mouth daily.    . cetirizine (ZYRTEC) 10 MG tablet Take 10 mg by mouth daily.    . Chlorphen-Pseudoephed-APAP (CORICIDIN D PO) Take by mouth.    . esomeprazole (NEXIUM) 40 MG capsule TAKE ONE CAPSULE BY MOUTH DAILY BEFORE BREAKFAST 90 capsule 0  . fesoterodine (TOVIAZ) 4 MG TB24 tablet Take 4 mg daily by mouth.    . gabapentin (NEURONTIN) 600 MG tablet TAKE ONE TABLET BY MOUTH THREE TIMES A  DAY 270 tablet 8  . glucose blood (ONE TOUCH ULTRA TEST) test strip USE STRIP TO CHECK BLOOD SUGAR DAILY 100 each 1  . hydrochlorothiazide (MICROZIDE) 12.5 MG capsule Take 1 capsule (12.5 mg total) by mouth daily. 90 capsule 1  . metFORMIN (GLUCOPHAGE-XR) 500 MG 24 hr tablet TAKE TWO TABLETS BY MOUTH TWICE A DAY 360 tablet 0  . rosuvastatin (CRESTOR) 5 MG tablet TAKE ONE TABLET BY MOUTH DAILY 90 tablet 1  . triamcinolone cream (KENALOG) 0.1 % APPLY 1 APPLICATION TOPICALLY 4 (FOUR) TIMES DAILY AS NEEDED FOR RASH 45 g 1  . UNKNOWN TO PATIENT Inject as directed every 6 (six) months. 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 valsartan (DIOVAN) 80 MG tablet Take 1 tablet (80 mg total) by mouth daily. 90 tablet 1  . Vitamin D, Ergocalciferol, (DRISDOL) 1.25 MG (50000 UT) CAPS capsule TAKE ONE CAPSULE BY MOUTH ONCE WEEKLY 12 capsule 0      Objective:     BP 114/60 (BP Location: Left Arm, Cuff Size: Normal)   Pulse 99   Ht 5' 4.5" (1.638 m)   Wt 162 lb 9.6 oz (73.8 kg)   SpO2 96%   BMI 27.48 kg/m   SpO2: 96 % RA   amb bf  slt hoarse wf nad freq throat clearing  HEENT: Edentulous/  Nl  turbinates bilaterally, and oropharynx. Nl external ear canal on L but impacted with wax on R  without cough reflex   NECK :  without JVD/Nodes/TM/ nl carotid upstrokes bilaterally   LUNGS: no acc muscle use,  Nl contour chest which is clear to A and P bilaterally without cough on insp or exp maneuvers   CV:  RRR  no s3 or murmur or increase in P2, and no edema   ABD:  Mildly obese/ soft and nontender with nl inspiratory excursion in the supine position. No bruits or organomegaly appreciated, bowel sounds nl  MS:  Nl gait/ ext warm without deformities, calf tenderness, cyanosis or clubbing No obvious joint restrictions   SKIN: warm and dry without lesions    NEURO:  alert, approp, nl sensorium with  no motor or cerebellar deficits apparent.       CXR PA and Lateral:   08/22/2018 :      I personally reviewed images and agree with radiology impression as follows:   Mild bilateral interstitial prominence which may be due to an acute bronchitic process. My review : changes are non-specific but more notable on lateral view      Labs ordered 08/22/2018  Allergy profile     Assessment   Upper airway cough syndrome Onset was around 2013  -  Neg egd  by Amy Escobar 09/06/12  - Allergy profile 08/22/2018 >  Eos 0.2 /  IgE   - 08/22/2018  gabapentin changed from 600 at hs to 300 mg each am and 600 at bedtime    The most common causes of chronic cough in immunocompetent adults include the following: upper airway cough syndrome (UACS), previously referred to as postnasal drip syndrome (PNDS), which is caused by variety of rhinosinus conditions; (2) asthma; (3) GERD; (4) chronic bronchitis from cigarette smoking or other inhaled environmental irritants; (5) nonasthmatic eosinophilic bronchitis; and (6) bronchiectasis.   These conditions, singly or in combination, have accounted for up to 94% of the causes of chronic cough in prospective studies.   Other conditions have constituted no >6% of the causes in prospective studies These have included bronchogenic carcinoma, chronic interstitial pneumonia, sarcoidosis, left ventricular failure, ACEI-induced cough, and aspiration from a condition associated with pharyngeal dysfunction.    Chronic cough is often simultaneously caused by more than one condition. A single cause has been found from 38 to 82% of the time, multiple causes from 18 to 62%. Multiply caused cough has been the result of three diseases up to 42% of the time.       This duration of cough assoc with urge to clear throat even when "better" is most c/w Upper airway cough syndrome (previously labeled PNDS),  is so named because it's frequently impossible to sort out how much is  CR/sinusitis with freq throat clearing (which can be related to primary GERD)   vs  causing  secondary ("  extra esophageal")  GERD from wide swings in gastric pressure that occur with throat clearing, often  promoting self use of mint and menthol lozenges that reduce the lower esophageal sphincter tone and exacerbate the problem further in a cyclical fashion.   These are the same pts (now being labeled as having "irritable larynx syndrome" by some cough centers) who not infrequently have a history of having failed to tolerate ace inhibitors,  dry powder inhalers or biphosphonates or report having atypical/extraesophageal reflux symptoms that don't respond to standard doses of PPI  and are easily confused as having aecopd or asthma flares by even experienced allergists/ pulmonologists (myself included).     Of the three most common causes of  Sub-acute / recurrent or chronic cough, only one (GERD)  can actually contribute to/ trigger  the other two (asthma and post nasal drip syndrome)  and perpetuate the cylce of cough.  While not intuitively obvious, many patients with chronic low grade reflux do not cough until there is a primary insult that disturbs the protective epithelial barrier and exposes sensitive nerve endings.   This is typically viral but can due to PNDS and  either may apply here.   The point is that once this occurs, it is difficult to eliminate the cycle  using anything but a maximally effective acid suppression regimen at least in the short run, accompanied by an appropriate diet to address non acid GERD and control / eliminate the cough itself with tessalon 200 in short term and for long term  titrating gabapentin up to 300 mg in am and 600 mg in pm until returns in 4 weeks to regroup.   Reviewed:  The standardized cough guidelines published in Chest by Lissa Morales in 2006 are still the best available and consist of a multiple step process (up to 12!) , not a single office visit,  and are intended  to address this problem logically,  with an alogrithm dependent on response to empiric  treatment at  each progressive step  to determine a specific diagnosis with  minimal addtional testing needed. Therefore if adherence is an issue or can't be accurately verified,  it's very unlikely the standard evaluation and treatment will be successful here.    Furthermore, response to therapy (other than acute cough suppression, which should only be used short term with avoidance of narcotic containing cough syrups if possible), can be a gradual process for which the patient is not likely to  perceive immediate benefit.  Unlike going to an eye doctor where the best perscription is almost always the first one and is immediately effective, this is almost never the case in the management of chronic cough syndromes. Therefore the patient needs to commit up front to consistently adhere to recommendations  for up to 6 weeks of therapy directed at the likely underlying problem(s) before the response can be reasonably evaluated.      Total time devoted to counseling  > 50 % of initial 60 min office visit:  review case with pt/ discussion of options/alternatives/ personally creating written customized instructions  in presence of pt  then going over those specific  Instructions directly with the pt including how to use all of the meds but in particular covering each new medication in detail and the difference between the maintenance= "automatic" meds and the prns using an action Escobar format for the latter (If this problem/symptom => do that organization reading Left to right).  Please see AVS from this visit for a full list of these instructions which I personally wrote for this pt and  are unique to this visit.      Christinia Gully, MD 08/22/2018

## 2018-08-23 ENCOUNTER — Encounter: Payer: Self-pay | Admitting: Internal Medicine

## 2018-08-23 LAB — RESPIRATORY ALLERGY PROFILE REGION II ~~LOC~~
Allergen, A. alternata, m6: <0.1 kU/L
Allergen, Cedar tree, t12: 0.68 kU/L — ABNORMAL HIGH
Allergen, Comm Silver Birch, t9: 0.51 kU/L — ABNORMAL HIGH
Allergen, Cottonwood, t14: 0.8 kU/L — ABNORMAL HIGH
Allergen, D pternoyssinus,d7: 1 kU/L — ABNORMAL HIGH
Allergen, Mouse Urine Protein, e78: <0.1 kU/L
Allergen, Mulberry, t76: 0.19 kU/L — ABNORMAL HIGH
Allergen, Oak,t7: 0.43 kU/L — ABNORMAL HIGH
Allergen, P. notatum, m1: 0.16 kU/L — ABNORMAL HIGH
Aspergillus fumigatus, m3: 0.41 kU/L — ABNORMAL HIGH
Bermuda Grass: 0.57 kU/L — ABNORMAL HIGH
Box Elder IgE: 0.78 kU/L — ABNORMAL HIGH
CLADOSPORIUM HERBARUM (M2) IGE: 0.34 kU/L — ABNORMAL HIGH
CLASS: 0
CLASS: 0
CLASS: 1
COCKROACH: 0.55 kU/L — AB
COMMON RAGWEED (SHORT) (W1) IGE: 1.09 kU/L — ABNORMAL HIGH
Cat Dander: <0.1 kU/L
Class: 0
Class: 0
Class: 0
Class: 0
Class: 0
Class: 1
Class: 1
Class: 1
Class: 1
Class: 1
Class: 1
Class: 1
Class: 1
Class: 1
Class: 2
Class: 2
Class: 2
Class: 2
Class: 2
Class: 2
Class: 2
D. farinae: 1.04 kU/L — ABNORMAL HIGH
Dog Dander: 0.31 kU/L — ABNORMAL HIGH
Elm IgE: 1.04 kU/L — ABNORMAL HIGH
IgE (Immunoglobulin E), Serum: 2095 kU/L — ABNORMAL HIGH (ref <=114)
Johnson Grass: 0.85 kU/L — ABNORMAL HIGH
Pecan/Hickory Tree IgE: 0.55 kU/L — ABNORMAL HIGH
Rough Pigweed  IgE: 0.53 kU/L — ABNORMAL HIGH
Sheep Sorrel IgE: 0.53 kU/L — ABNORMAL HIGH
Timothy Grass: 0.46 kU/L — ABNORMAL HIGH

## 2018-08-23 LAB — INTERPRETATION:

## 2018-08-29 NOTE — Progress Notes (Signed)
Spoke with pt and notified of results per Dr. Wert. Pt verbalized understanding and denied any questions. 

## 2018-08-31 DIAGNOSIS — H33101 Unspecified retinoschisis, right eye: Secondary | ICD-10-CM | POA: Diagnosis not present

## 2018-08-31 DIAGNOSIS — H43813 Vitreous degeneration, bilateral: Secondary | ICD-10-CM | POA: Diagnosis not present

## 2018-08-31 DIAGNOSIS — H3522 Other non-diabetic proliferative retinopathy, left eye: Secondary | ICD-10-CM | POA: Diagnosis not present

## 2018-08-31 DIAGNOSIS — E113392 Type 2 diabetes mellitus with moderate nonproliferative diabetic retinopathy without macular edema, left eye: Secondary | ICD-10-CM | POA: Diagnosis not present

## 2018-08-31 DIAGNOSIS — H33321 Round hole, right eye: Secondary | ICD-10-CM | POA: Diagnosis not present

## 2018-09-02 DIAGNOSIS — H3522 Other non-diabetic proliferative retinopathy, left eye: Secondary | ICD-10-CM | POA: Diagnosis not present

## 2018-09-05 ENCOUNTER — Ambulatory Visit
Admission: RE | Admit: 2018-09-05 | Discharge: 2018-09-05 | Disposition: A | Discharge status: Home / Self Care | Payer: Medicare Other | Source: Ambulatory Visit | Attending: Family | Admitting: Family

## 2018-09-05 DIAGNOSIS — Z1231 Encounter for screening mammogram for malignant neoplasm of breast: Secondary | ICD-10-CM | POA: Diagnosis not present

## 2018-09-14 ENCOUNTER — Encounter: Payer: Self-pay | Admitting: Family Medicine

## 2018-09-14 ENCOUNTER — Other Ambulatory Visit: Payer: Self-pay

## 2018-09-14 ENCOUNTER — Ambulatory Visit (INDEPENDENT_AMBULATORY_CARE_PROVIDER_SITE_OTHER): Payer: Medicare Other | Admitting: Family Medicine

## 2018-09-14 DIAGNOSIS — M1711 Unilateral primary osteoarthritis, right knee: Secondary | ICD-10-CM

## 2018-09-14 DIAGNOSIS — M7061 Trochanteric bursitis, right hip: Secondary | ICD-10-CM

## 2018-09-14 DIAGNOSIS — M170 Bilateral primary osteoarthritis of knee: Secondary | ICD-10-CM

## 2018-09-14 DIAGNOSIS — M7062 Trochanteric bursitis, left hip: Secondary | ICD-10-CM

## 2018-09-14 NOTE — Assessment & Plan Note (Signed)
Left-sided injected today.  Tolerated procedure well.  Discussed topical anti-inflammatories icing regimen, importance of hip abductor strengthening.  Follow-up again in 4 weeks

## 2018-09-14 NOTE — Progress Notes (Addendum)
[name] [address] [place], [place] [address] Phone: [phone] Subjective:   I Amy Escobar am serving as a Education administrator for Dr. Hulan Saas.   CC: knee pain   [id number]   06/15/2018 Bilateral injections given today.  Tolerated the procedure well.  Discussed icing regimen and home exercise.  Discussed which activities of doing which wants to avoid.  Increase activity as tolerated.  Follow-up again in 4 to 6 weeks  Monovisc given again today.  But in 6 months again.  Hopefully patient responds well to the injections.  Discussed icing regimen and home exercises.  Discussed which activities to do which wants to avoid.  Increase activity as tolerated.  Follow-up again in 6 weeks  Updated 09/14/2018 Amy Escobar is a 73 y.o. female coming in with complaint of bilateral hip pain. State that the left hip is painful as well as the right knee.  Patient does administrative changes of the right knee.  Some instability starting.  Has responded well to injections previously.  Last injections were greater than 4 months ago also having some recently hip pain.  Left side.  Waking her up at night.  No radiation down the leg sometimes after sitting long amount of time getting more discomfort.     Past Medical History:  Diagnosis Date  . Alkaline phosphatase deficiency    w/u Ne  . Allergic rhinitis   . Anxiety   . Arthritis   . DM2 (diabetes mellitus, type 2) ([place])   . GERD (gastroesophageal reflux disease)   . Gout   . Hemorrhoids   . Hyperlipidemia   . Hypertension   . Mild pulmonary hypertension ([place])   . NSVT (nonsustained ventricular tachycardia) ([place])   . Osteoporosis   . PVC's (premature ventricular contractions)   . Thyroid nodule    small  . Uterine fibroid    Past Surgical History:  Procedure Laterality Date  . CATARACT EXTRACTION Bilateral 2016  . COLONOSCOPY  2007  . echocardiogram (other)  01/16/2002  . 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 stress cardiolite  02/12/2006  . TOENAIL EXCISION    . TUBAL LIGATION     Social History   Socioeconomic History  . Marital status: Widowed    Spouse name: Not on file  . Number of children: 2  . Years of education: Not on file  . Highest education level: Not on file  Occupational History  . Occupation: Surveyor, minerals: UNEMPLOYED  Social Needs  . Financial resource strain: Not on file  . Food insecurity:    Worry: Not on file    Inability: Not on file  . Transportation needs:    Medical: Not on file    Non-medical: Not on file  Tobacco Use  . Smoking status: Former Smoker    Packs/day: 0.25    Years: 5.00    Pack years: 1.25    Types: Cigarettes    Last attempt to quit: 06/29/1968    Years since quitting: 50.2  . Smokeless tobacco: Never Used  Substance and Sexual Activity  . Alcohol use: No    Alcohol/week: 0.0 standard drinks  . Drug use: No  . Sexual activity: Not on file  Lifestyle  . Physical activity:    Days per week: Not on file    Minutes per session: Not on file  . Stress: Not on file  Relationships  . Social connections:  Talks on phone: Not on file    Gets together: Not on file    Attends religious service: Not on file    Active member of club or organization: Not on file    Attends meetings of clubs or organizations: Not on file    Relationship status: Not on file  Other Topics Concern  . Not on file  Social History Narrative   Widowed 2010.    Allergies  Allergen Reactions  . Olmesartan Medoxomil     REACTION: headache   Family History  Problem Relation Age of Onset  . Heart disease Mother   . Heart disease Father   . Heart disease Maternal Grandfather   . Rectal cancer Maternal Grandfather   . Stomach cancer Maternal Grandmother   . Colon cancer Neg Hx   . Breast cancer Neg Hx     Current Outpatient Medications (Endocrine & Metabolic):  .  metFORMIN (GLUCOPHAGE-XR) 500 MG 24 hr tablet, TAKE TWO TABLETS BY  MOUTH TWICE A DAY  Current Outpatient Medications (Cardiovascular):  .  amLODipine (NORVASC) 2.5 MG tablet, Take 1 tablet (2.5 mg total) by mouth daily. .  hydrochlorothiazide (MICROZIDE) 12.5 MG capsule, Take 1 capsule (12.5 mg total) by mouth daily. .  rosuvastatin (CRESTOR) 5 MG tablet, TAKE ONE TABLET BY MOUTH DAILY .  valsartan (DIOVAN) 80 MG tablet, Take 1 tablet (80 mg total) by mouth daily.  Current Outpatient Medications (Respiratory):  .  benzonatate (TESSALON) 200 MG capsule, Take 1 capsule (200 mg total) by mouth 3 (three) times daily as needed for cough. .  cetirizine (ZYRTEC) 10 MG tablet, Take 10 mg by mouth daily. .  Chlorphen-Pseudoephed-APAP (CORICIDIN D PO), Take by mouth.  Current Outpatient Medications (Analgesics):  .  aspirin EC 81 MG tablet, Take 81 mg by mouth daily.   Current Outpatient Medications (Other):  .  esomeprazole (NEXIUM) 40 MG capsule, Take 30- 60 min before your first and last meals of the day .  fesoterodine (TOVIAZ) 4 MG TB24 tablet, Take 4 mg daily by mouth. .  gabapentin (NEURONTIN) 600 MG tablet, TAKE ONE TABLET BY MOUTH THREE TIMES A DAY .  glucose blood (ONE TOUCH ULTRA TEST) test strip, USE STRIP TO CHECK BLOOD SUGAR DAILY .  triamcinolone cream (KENALOG) 0.1 %, APPLY 1 APPLICATION TOPICALLY 4 (FOUR) TIMES DAILY AS NEEDED FOR RASH .  UNKNOWN TO PATIENT, Inject as directed every 6 (six) months. 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  Vitamin D, Ergocalciferol, (DRISDOL) 1.25 MG (50000 UT) CAPS capsule, TAKE ONE CAPSULE BY MOUTH ONCE WEEKLY    Past medical history, social, surgical and family history all reviewed in electronic medical record.  No pertanent information unless stated regarding to the chief complaint.   Review of Systems:  No headache, visual changes, nausea, vomiting, diarrhea, constipation, dizziness, abdominal pain, skin rash, fevers, chills, night sweats, weight loss, swollen lymph nodes, body aches, joint  swelling, , chest pain, shortness of breath, mood changes.  Positive muscle aches  Objective  Blood pressure 110/74, pulse 89, height 5' 4.5" (1.638 m), weight 162 lb (73.5 kg), SpO2 97 %.    General: No apparent distress alert and oriented x3 mood and affect normal, dressed appropriately.  HEENT: Pupils equal, extraocular movements intact  Respiratory: Patient's speak in full sentences and does not appear short of breath  Cardiovascular: No lower extremity edema, non tender, no erythema  Skin: Warm dry intact with no signs of infection or rash on extremities or  on axial skeleton.  Abdomen: Soft nontender  Neuro: Cranial nerves II through XII are intact, neurovascularly intact in all extremities with 2+ DTRs and 2+ pulses.  Lymph: No lymphadenopathy of posterior or anterior cervical chain or axillae bilaterally.  Gait normal with good balance and coordination.  MSK:  Non tender with full range of motion and good stability and symmetric strength and tone of shoulders, elbows, wrist, and ankles bilaterally.  Left hip exam shows severe tenderness to palpation over the greater trochanteric area.  Positive Faber test.  Some tightness in this area.  Negative straight leg test.  4+ out of 5 strength.  Knee: Right valgus deformity noted.  Abnormal thigh to calf ratio.  Tender to palpation over medial and PF joint line.  ROM full in flexion and extension and lower leg rotation. instability with valgus force.  painful patellar compression. Patellar glide with moderate crepitus. Patellar and quadriceps tendons unremarkable. Hamstring and quadriceps strength is normal. Contralateral knee shows mild arthritic changes as well with some mild instability.  After informed written and verbal consent, patient was seated on exam table. Right knee was prepped with alcohol swab and utilizing anterolateral approach, patient's right knee space was injected with 4:1  marcaine 0.5%: Kenalog 40mg /dL. Patient  tolerated the procedure well without immediate complications.  After verbal consent patient was prepped with alcohol swabs and with a 21-gauge 3 inch needle injected with 1 cc of 0.5% Marcaine and 1 cc of Kenalog 40 mg/mL into the left greater trochanteric area.  No blood loss.  Band-Aid placed.  Postinjection instructions given   Impression an Left GT injectiond Recommendations:     This case required medical decision making of moderate complexity. The above documentation has been reviewed and is accurate and complete Lyndal Pulley, DO       Note: This dictation was prepared with Dragon dictation along with smaller phrase technology. Any transcriptional errors that result from this process are unintentional.

## 2018-09-14 NOTE — Assessment & Plan Note (Signed)
Patient given injection today.  Tolerated procedure well.  Discussed icing regimen and home exercise.  Can possibly repeat Monovisc in the next 3 months if necessary.  Follow-up again 4 weeks

## 2018-09-14 NOTE — Patient Instructions (Signed)
Good to see you  Sorry for the delay Injected and hope it helps See me again in 3 months

## 2018-09-16 DIAGNOSIS — H3521 Other non-diabetic proliferative retinopathy, right eye: Secondary | ICD-10-CM | POA: Diagnosis not present

## 2018-09-20 ENCOUNTER — Ambulatory Visit: Payer: Medicare Other | Admitting: Internal Medicine

## 2018-09-27 ENCOUNTER — Other Ambulatory Visit: Payer: Self-pay | Admitting: Endocrinology

## 2018-09-30 ENCOUNTER — Other Ambulatory Visit: Payer: Self-pay | Admitting: Family Medicine

## 2018-10-03 ENCOUNTER — Telehealth: Payer: Self-pay | Admitting: Endocrinology

## 2018-10-03 ENCOUNTER — Other Ambulatory Visit: Payer: Self-pay

## 2018-10-03 MED ORDER — GLUCOSE BLOOD VI STRP: ORAL_STRIP | 1 refills | Status: DC

## 2018-10-03 NOTE — Telephone Encounter (Signed)
Patient would like another prescription written for 100 test strips and sent into her pharmacy      [facility] [address] [place]

## 2018-10-03 NOTE — Telephone Encounter (Signed)
glucose blood (ONE TOUCH ULTRA TEST) test strip 100 each 1 10/03/2018    Sig: Use to monitor glucose levels daily; E11.8   Sent to pharmacy as: glucose blood (ONE TOUCH ULTRA TEST) test strip   E-Prescribing Status: Sent to pharmacy (10/03/2018 2:40 PM EDT)

## 2018-10-05 ENCOUNTER — Encounter: Payer: Self-pay | Admitting: Family Medicine

## 2018-10-25 NOTE — Progress Notes (Signed)
[facility] [address] [place], [place] [address] Phone: [phone] Subjective:    Amy Escobar, am serving as a scribe for Dr. Hulan Saas.   CC: Hip and knee pain follow-up  [id number]  Amy Escobar is a 73 y.o. female coming in with complaint of hip and knee pain follow-up.  Seen 6 weeks ago and given injection of the right knee in the left greater trochanteric area.  Patient states Overall she has been doing relatively good.  Does have some discomfort and pain though.  Seems to be more pain in the bilateral knees.  Affecting daily activities.  Patient is unable to walk long distances secondary to the discomfort and pain.  Sometimes waking her up at night does not want to take any more medications.      Past Medical History:  Diagnosis Date  . Alkaline phosphatase deficiency    w/u Ne  . Allergic rhinitis   . Anxiety   . Arthritis   . DM2 (diabetes mellitus, type 2) ([place])   . GERD (gastroesophageal reflux disease)   . Gout   . Hemorrhoids   . Hyperlipidemia   . Hypertension   . Mild pulmonary hypertension ([place])   . NSVT (nonsustained ventricular tachycardia) ([place])   . Osteoporosis   . PVC's (premature ventricular contractions)   . Thyroid nodule    small  . Uterine fibroid    Past Surgical History:  Procedure Laterality Date  . CATARACT EXTRACTION Bilateral 2016  . COLONOSCOPY  2007  . echocardiogram (other)  01/16/2002  . 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 stress cardiolite  02/12/2006  . TOENAIL EXCISION    . TUBAL LIGATION     Social History   Socioeconomic History  . Marital status: Widowed    Spouse name: Not on file  . Number of children: 2  . Years of education: Not on file  . Highest education level: Not on file  Occupational History  . Occupation: Surveyor, minerals: UNEMPLOYED  Social Needs  . Financial resource strain: Not on file  . Food insecurity:    Worry: Not on file    Inability:  Not on file  . Transportation needs:    Medical: Not on file    Non-medical: Not on file  Tobacco Use  . Smoking status: Former Smoker    Packs/day: 0.25    Years: 5.00    Pack years: 1.25    Types: Cigarettes    Last attempt to quit: 06/29/1968    Years since quitting: 50.3  . Smokeless tobacco: Never Used  Substance and Sexual Activity  . Alcohol use: Escobar    Alcohol/week: 0.0 standard drinks  . Drug use: Escobar  . Sexual activity: Not on file  Lifestyle  . Physical activity:    Days per week: Not on file    Minutes per session: Not on file  . Stress: Not on file  Relationships  . Social connections:    Talks on phone: Not on file    Gets together: Not on file    Attends religious service: Not on file    Active member of club or organization: Not on file    Attends meetings of clubs or organizations: Not on file    Relationship status: Not on file  Other Topics Concern  . Not on file  Social History Narrative   Widowed 2010.    Allergies  Allergen Reactions  .  Olmesartan Medoxomil     REACTION: headache   Family History  Problem Relation Age of Onset  . Heart disease Mother   . Heart disease Father   . Heart disease Maternal Grandfather   . Rectal cancer Maternal Grandfather   . Stomach cancer Maternal Grandmother   . Colon cancer Neg Hx   . Breast cancer Neg Hx     Current Outpatient Medications (Endocrine & Metabolic):  .  metFORMIN (GLUCOPHAGE-XR) 500 MG 24 hr tablet, TAKE TWO TABLETS BY MOUTH TWICE A DAY  Current Outpatient Medications (Cardiovascular):  .  amLODipine (NORVASC) 2.5 MG tablet, Take 1 tablet (2.5 mg total) by mouth daily. .  hydrochlorothiazide (MICROZIDE) 12.5 MG capsule, Take 1 capsule (12.5 mg total) by mouth daily. .  rosuvastatin (CRESTOR) 5 MG tablet, TAKE ONE TABLET BY MOUTH DAILY .  valsartan (DIOVAN) 80 MG tablet, Take 1 tablet (80 mg total) by mouth daily. .  valsartan-hydrochlorothiazide (DIOVAN-HCT) 160-12.5 MG tablet, TAKE ONE  TABLET BY MOUTH DAILY  Current Outpatient Medications (Respiratory):  .  benzonatate (TESSALON) 200 MG capsule, Take 1 capsule (200 mg total) by mouth 3 (three) times daily as needed for cough. .  cetirizine (ZYRTEC) 10 MG tablet, Take 10 mg by mouth daily. .  Chlorphen-Pseudoephed-APAP (CORICIDIN D PO), Take by mouth.  Current Outpatient Medications (Analgesics):  .  aspirin EC 81 MG tablet, Take 81 mg by mouth daily.   Current Outpatient Medications (Other):  .  esomeprazole (NEXIUM) 40 MG capsule, Take 30- 60 min before your first and last meals of the day .  fesoterodine (TOVIAZ) 4 MG TB24 tablet, Take 4 mg daily by mouth. .  gabapentin (NEURONTIN) 600 MG tablet, TAKE ONE TABLET BY MOUTH THREE TIMES A DAY .  glucose blood (ONE TOUCH ULTRA TEST) test strip, Use to monitor glucose levels daily; E11.8 .  triamcinolone cream (KENALOG) 0.1 %, APPLY 1 APPLICATION TOPICALLY 4 (FOUR) TIMES DAILY AS NEEDED FOR RASH .  UNKNOWN TO PATIENT, Inject as directed every 6 (six) months. 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  Vitamin D, Ergocalciferol, (DRISDOL) 1.25 MG (50000 UT) CAPS capsule, TAKE ONE CAPSULE BY MOUTH ONCE WEEKLY    Past medical history, social, surgical and family history all reviewed in electronic medical record.  Escobar pertanent information unless stated regarding to the chief complaint.   Review of Systems:  Escobar headache, visual changes, nausea, vomiting, diarrhea, constipation, dizziness, abdominal pain, skin rash, fevers, chills, night sweats, weight loss, swollen lymph nodes, body aches, joint swelling, muscle aches, chest pain, shortness of breath, mood changes.   Objective  Blood pressure 122/76, height 5' 4.5" (1.638 m), weight 167 lb (75.8 kg).     General: Escobar apparent distress alert and oriented x3 mood and affect normal, dressed appropriately.  HEENT: Pupils equal, extraocular movements intact  Respiratory: Patient's speak in full sentences and does not  appear short of breath  Cardiovascular: Escobar lower extremity edema, non tender, Escobar erythema  Skin: Warm dry intact with Escobar signs of infection or rash on extremities or on axial skeleton.  Abdomen: Soft nontender  Neuro: Cranial nerves II through XII are intact, neurovascularly intact in all extremities with 2+ DTRs and 2+ pulses.  Lymph: Escobar lymphadenopathy of posterior or anterior cervical chain or axillae bilaterally.  Gait normal with good balance and coordination.  MSK:  Non tender with full range of motion and good stability and symmetric strength and tone of shoulders, elbows, wrist, and ankles bilaterally.  PRX:YVOP  ROM IR: 25 Deg, ER: 45 Deg, Flexion: 120 Deg, Extension: 100 Deg, Abduction: 45 Deg, Adduction: 305 Deg Strength IR: 5/5, ER: 5/5, Flexion: 5/5, Extension: 5/5, Abduction: 5/5, Adduction: 5/5 Pelvic alignment unremarkable to inspection and palpation. Standing hip rotation and gait without trendelenburg sign / unsteadiness. Greater trochanter without tenderness to palpation. Escobar tenderness over piriformis and greater trochanter. Positive Faber on the left side Escobar SI joint tenderness and normal minimal SI movement.  Knee: Bilateral valgus deformity noted.  Abnormal thigh to calf ratio.  Tender to palpation over medial and PF joint line.  ROM full in flexion and extension and lower leg rotation. instability with valgus force.  painful patellar compression. Patellar glide with moderate crepitus. Patellar and quadriceps tendons unremarkable. Hamstring and quadriceps strength is normal.   After informed written and verbal consent, patient was seated on exam table. Right knee was prepped with alcohol swab and utilizing anterolateral approach, patient's right knee space was injected with 4:1  marcaine 0.5%: Kenalog 40mg /dL. Patient tolerated the procedure well without immediate complications.  After informed written and verbal consent, patient was seated on exam table. Left  knee was prepped with alcohol swab and utilizing anterolateral approach, patient's left knee space was injected with 4:1  marcaine 0.5%: Kenalog 40mg /dL. Patient tolerated the procedure well without immediate complications.   Impression and Recommendations:     This case required medical decision making of moderate complexity. The above documentation has been reviewed and is accurate and complete Lyndal Pulley, DO       Note: This dictation was prepared with Dragon dictation along with smaller phrase technology. Any transcriptional errors that result from this process are unintentional.

## 2018-10-26 ENCOUNTER — Ambulatory Visit (INDEPENDENT_AMBULATORY_CARE_PROVIDER_SITE_OTHER): Payer: Medicare Other | Admitting: Family Medicine

## 2018-10-26 ENCOUNTER — Other Ambulatory Visit: Payer: Self-pay

## 2018-10-26 ENCOUNTER — Encounter: Payer: Self-pay | Admitting: Family Medicine

## 2018-10-26 DIAGNOSIS — M170 Bilateral primary osteoarthritis of knee: Secondary | ICD-10-CM | POA: Diagnosis not present

## 2018-10-26 NOTE — Assessment & Plan Note (Signed)
Bilateral injections given today.  Tolerated the procedure well.  We decided to do this on patient can avoid medical care for a longer period of time during the coronavirus outbreak.  Patient is somewhat high risk.  We discussed that patient would be able to be a candidate again for viscosupplementation in 6 weeks.  Patient will return at that time

## 2018-10-26 NOTE — Patient Instructions (Addendum)
Great to see you  Be safe  See me again in 6ish weeks We can do the other type of injections after May 18th if you want

## 2018-11-08 ENCOUNTER — Ambulatory Visit (INDEPENDENT_AMBULATORY_CARE_PROVIDER_SITE_OTHER): Payer: Medicare Other | Admitting: Internal Medicine

## 2018-11-08 ENCOUNTER — Encounter: Payer: Self-pay | Admitting: Internal Medicine

## 2018-11-08 ENCOUNTER — Other Ambulatory Visit: Payer: Self-pay

## 2018-11-08 VITALS — BP 90/60 | HR 87 | Temp 98.2°F | Ht 64.5 in | Wt 173.0 lb

## 2018-11-08 DIAGNOSIS — R05 Cough: Secondary | ICD-10-CM

## 2018-11-08 DIAGNOSIS — R058 Other specified cough: Secondary | ICD-10-CM

## 2018-11-08 MED ORDER — BENZONATATE 200 MG PO CAPS: 200.0000 mg | ORAL_CAPSULE | Freq: Three times a day (TID) | ORAL | 2 refills | Status: DC | PRN

## 2018-11-08 NOTE — Progress Notes (Signed)
Amy Escobar, female    DOB: 1945-08-02    MRN: [id number]   Brief patient profile:  14 yobf quit smoking for good 1980s with h/o GERD going back to at least 2010 with neg egd by Amy [facility]obar 09/06/12   And some tendency for colds to settle in sinuses and tendency for pnds req daily zyrtec x 2018 - was eval in pulmonary clinic with nl pfts/unexplained doe and cough attributed to uacs and started on gabapentin but did not return p 09/12/13 with  recs  As follows:  Increase neurontin to 300 mg  three times a day to see if helps your cough  If still have drainage a recommend a trial of zyrtec 10 mg daily and possible an allergy evaluation   Improved p this eval and req gabapentin up to 600 tid for dm neuropathy but adjusted this down on her own due to excess dayime drowsiness at only taking 600 mg at when self referred back to pulmonary clinic 08/22/2018 due to cough since mid Jan 2020 s/p 2 zpaks     History of Present Illness  08/22/2018  Pulmonary/ 1st office eval/Amy Escobar  Chief Complaint  Patient presents with  . Pulmonary Consult    Self referral. Pt c/o cough x 5 wks- currently prod with clear sputum.   Dyspnea:  Does flat and slow = MMRC2 = can't walk a nl pace on a flat grade s sob but does fine slow and flat also limited by knees  Cough: urge to clear throat x 2013 / pred did not help / daytime aggravated by voice use  Sleep: on one pillow / bed is flat sometimes wakes her up / rec Increase you nexium to 40 mg Take 30- 60 min before your first and last meals of the day  Increase your gabapentin to 300 mg each am and continue 600 mg at bedtime  For cough as needed >  Tessalon 200 mg up to every 6 hours and the goal is no coughing at all  GERD  Diet       11/08/2018  f/u ov/Amy Escobar re: uacs improved vs prior  Chief Complaint  Patient presents with  . Follow-up    Cough is about the same. No new co's today.   Dyspnea:  MMRC1 = can walk nl pace, flat grade, can't hurry or go uphills or  steps s sob  Cough: improved but needing tessilon 200 up to twice daily  Sleeping: on wedge wake up with dry cough about  twice weekly  SABA use: none  02: none    No obvious day to day or daytime variability or assoc excess/ purulent sputum or mucus plugs or hemoptysis or cp or chest tightness, subjective wheeze or overt sinus or hb symptoms.    Also denies any obvious fluctuation of symptoms with weather or environmental changes or other aggravating or alleviating factors except as outlined above   No unusual exposure hx or h/o childhood pna/ asthma or knowledge of premature birth.  Current Allergies, Complete Past Medical History, Past Surgical History, Family History, and Social History were reviewed in [employer] record.  ROS  The following are not active complaints unless bolded Hoarseness, sore throat, dysphagia, dental problems, itching, sneezing,  nasal congestion or discharge of excess mucus or purulent secretions, ear ache,   fever, chills, sweats, unintended wt loss or wt gain, classically pleuritic or exertional cp,  orthopnea pnd or arm/hand swelling  or leg swelling, presyncope, palpitations, abdominal  pain, anorexia, nausea, vomiting, diarrhea  or change in bowel habits or change in bladder habits, change in stools or change in urine, dysuria, hematuria,  rash, arthralgias, visual complaints, headache, numbness, weakness or ataxia or problems with walking or coordination,  change in mood or  memory.        Current Meds  Medication Sig  . amLODipine (NORVASC) 2.5 MG tablet Take 1 tablet (2.5 mg total) by mouth daily.  Marland Kitchen aspirin EC 81 MG tablet Take 81 mg by mouth daily.  . benzonatate (TESSALON) 200 MG capsule Take 1 capsule (200 mg total) by mouth 3 (three) times daily as needed for cough.  . cetirizine (ZYRTEC) 10 MG tablet Take 10 mg by mouth daily.  . Chlorphen-Pseudoephed-APAP (CORICIDIN D PO) Take by mouth.  . esomeprazole (NEXIUM) 40 MG capsule  Take 30- 60 min before your first and last meals of the day  . fesoterodine (TOVIAZ) 4 MG TB24 tablet Take 4 mg daily by mouth.  . gabapentin (NEURONTIN) 600 MG tablet TAKE ONE TABLET BY MOUTH THREE TIMES A DAY  . glucose blood (ONE TOUCH ULTRA TEST) test strip Use to monitor glucose levels daily; E11.8  . hydrochlorothiazide (MICROZIDE) 12.5 MG capsule Take 1 capsule (12.5 mg total) by mouth daily.  . metFORMIN (GLUCOPHAGE-XR) 500 MG 24 hr tablet TAKE TWO TABLETS BY MOUTH TWICE A DAY  . rosuvastatin (CRESTOR) 5 MG tablet TAKE ONE TABLET BY MOUTH DAILY  . triamcinolone cream (KENALOG) 0.1 % APPLY 1 APPLICATION TOPICALLY 4 (FOUR) TIMES DAILY AS NEEDED FOR RASH  . UNKNOWN TO PATIENT Inject as directed every 6 (six) months. 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 valsartan (DIOVAN) 80 MG tablet Take 1 tablet (80 mg total) by mouth daily.  . Vitamin D, Ergocalciferol, (DRISDOL) 1.25 MG (50000 UT) CAPS capsule TAKE ONE CAPSULE BY MOUTH ONCE WEEKLY               Objective:     Wt Readings from Last 3 Encounters:  11/08/18 173 lb (78.5 kg)  10/26/18 167 lb (75.8 kg)  09/14/18 162 lb (73.5 kg)     Vital signs reviewed - Note on arrival 02 sats  98% on RA and bp 90/60 but no symptoms     HEENT: nl dentition, turbinates bilaterally, and oropharynx. Nl external ear canals without cough reflex   NECK :  without JVD/Nodes/TM/ nl carotid upstrokes bilaterally   LUNGS: no acc muscle use,  Nl contour chest which is clear to A and P bilaterally without cough on insp or exp maneuvers   CV:  RRR  no s3 or murmur or increase in P2, and no edema   ABD:  soft and nontender with nl inspiratory excursion in the supine position. No bruits or organomegaly appreciated, bowel sounds nl  MS:  Nl gait/ ext warm without deformities, calf tenderness, cyanosis or clubbing No obvious joint restrictions   SKIN: warm and dry without lesions    NEURO:  alert, approp, nl sensorium with  no motor  or cerebellar deficits apparent.            Assessment

## 2018-11-08 NOTE — Patient Instructions (Addendum)
For drainage / throat tickle try take CHLORPHENIRAMINE  4 mg  (Chlortab 4mg   at [facility] should be easiest to find in the green box)  take one every 4 hours as needed - available over the counter- may cause drowsiness so start with just a bedtime dose or two and see how you tolerate it before trying in daytime   Ok to take  zyrtec otc daily  but you may find you don't need it anymore    - try and see     Please schedule a follow up visit in 6 months but call sooner if needed  - consider adding singulair if hasn't tried it then allergy eval next step

## 2018-11-10 ENCOUNTER — Ambulatory Visit: Payer: Medicare Other | Admitting: Cardiology

## 2018-11-13 ENCOUNTER — Encounter: Payer: Self-pay | Admitting: Internal Medicine

## 2018-11-13 NOTE — Assessment & Plan Note (Addendum)
Onset was around 2013  -  Neg egd by [facility] Plan 09/06/12  - Allergy profile 08/22/2018 >  Eos 0.2 /  IgE  2095 RAST pos for everything x cats  - 08/22/2018  gabapentin changed from 600 at hs to 300 mg each am and 600 at bedtime  Despite resp allergies doing reasonably well x for occ noct cough for which 1st gen H1 blockers per guidelines work the best/ advised.   If not satisfied consider adding singulair if hasn't tried it then formal allergy consultation if needed    Each maintenance medication was reviewed in detail including most importantly the difference between maintenance and as needed and under what circumstances the prns are to be used.  Please see AVS for specific  Instructions which are unique to this visit and I personally typed out  which were reviewed in detail in writing with the patient and a copy provided.

## 2018-11-15 NOTE — Progress Notes (Signed)
[name] [place], [place] [address] Phone: [phone] Subjective:   [name], am serving as a scribe for Dr. Hulan Saas.  CC: Bilateral knee pain  [id number]    Bilateral injections given today.  Tolerated the procedure well.  We decided to do this on patient can avoid medical care for a longer period of time during the coronavirus outbreak.  Patient is somewhat high risk.  We discussed that patient would be able to be a candidate again for viscosupplementation in 6 weeks.  Patient will return at that time   Amy Escobar is a 73 y.o. female coming in with complaint of B knee pain.   Right knee is worse than left. Is having pain over the quad tendon recently.  Bilateral knee pain has been for some time.  Arthritic changes.  Failed all conservative therapy.  Here for Visco supplementation with patient has had some success.       Past Medical History:  Diagnosis Date  . Alkaline phosphatase deficiency    w/u Ne  . Allergic rhinitis   . Anxiety   . Arthritis   . DM2 (diabetes mellitus, type 2) ([place])   . GERD (gastroesophageal reflux disease)   . Gout   . Hemorrhoids   . Hyperlipidemia   . Hypertension   . Mild pulmonary hypertension ([place])   . NSVT (nonsustained ventricular tachycardia) ([place])   . Osteoporosis   . PVC's (premature ventricular contractions)   . Thyroid nodule    small  . Uterine fibroid    Past Surgical History:  Procedure Laterality Date  . CATARACT EXTRACTION Bilateral 2016  . COLONOSCOPY  2007  . echocardiogram (other)  01/16/2002  . 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 stress cardiolite  02/12/2006  . TOENAIL EXCISION    . TUBAL LIGATION     Social History   Socioeconomic History  . Marital status: Widowed    Spouse name: Not on file  . Number of children: 2  . Years of education: Not on file  . Highest education level: Not on file  Occupational History  . Occupation: Lexicographer: UNEMPLOYED  Social Needs  . Financial resource strain: Not on file  . Food insecurity:    Worry: Not on file    Inability: Not on file  . Transportation needs:    Medical: Not on file    Non-medical: Not on file  Tobacco Use  . Smoking status: Former Smoker    Packs/day: 0.25    Years: 5.00    Pack years: 1.25    Types: Cigarettes    Last attempt to quit: 06/29/1968    Years since quitting: 50.4  . Smokeless tobacco: Never Used  Substance and Sexual Activity  . Alcohol use: No    Alcohol/week: 0.0 standard drinks  . Drug use: No  . Sexual activity: Not on file  Lifestyle  . Physical activity:    Days per week: Not on file    Minutes per session: Not on file  . Stress: Not on file  Relationships  . Social connections:    Talks on phone: Not on file    Gets together: Not on file    Attends religious service: Not on file    Active member of club or organization: Not on file    Attends meetings of clubs or organizations: Not on file    Relationship status: Not  on file  Other Topics Concern  . Not on file  Social History Narrative   Widowed 2010.    Allergies  Allergen Reactions  . Olmesartan Medoxomil     REACTION: headache   Family History  Problem Relation Age of Onset  . Heart disease Mother   . Heart disease Father   . Heart disease Maternal Grandfather   . Rectal cancer Maternal Grandfather   . Stomach cancer Maternal Grandmother   . Colon cancer Neg Hx   . Breast cancer Neg Hx     Current Outpatient Medications (Endocrine & Metabolic):  .  metFORMIN (GLUCOPHAGE-XR) 500 MG 24 hr tablet, TAKE TWO TABLETS BY MOUTH TWICE A DAY  Current Outpatient Medications (Cardiovascular):  .  amLODipine (NORVASC) 2.5 MG tablet, Take 1 tablet (2.5 mg total) by mouth daily. .  hydrochlorothiazide (MICROZIDE) 12.5 MG capsule, Take 1 capsule (12.5 mg total) by mouth daily. .  rosuvastatin (CRESTOR) 5 MG tablet, TAKE ONE TABLET BY MOUTH DAILY .  valsartan  (DIOVAN) 80 MG tablet, Take 1 tablet (80 mg total) by mouth daily.  Current Outpatient Medications (Respiratory):  .  benzonatate (TESSALON) 200 MG capsule, Take 1 capsule (200 mg total) by mouth 3 (three) times daily as needed for cough. .  cetirizine (ZYRTEC) 10 MG tablet, Take 10 mg by mouth daily. .  Chlorphen-Pseudoephed-APAP (CORICIDIN D PO), Take by mouth.  Current Outpatient Medications (Analgesics):  .  aspirin EC 81 MG tablet, Take 81 mg by mouth daily.   Current Outpatient Medications (Other):  .  esomeprazole (NEXIUM) 40 MG capsule, Take 30- 60 min before your first and last meals of the day .  fesoterodine (TOVIAZ) 4 MG TB24 tablet, Take 4 mg daily by mouth. .  gabapentin (NEURONTIN) 600 MG tablet, TAKE ONE TABLET BY MOUTH THREE TIMES A DAY .  glucose blood (ONE TOUCH ULTRA TEST) test strip, Use to monitor glucose levels daily; E11.8 .  triamcinolone cream (KENALOG) 0.1 %, APPLY 1 APPLICATION TOPICALLY 4 (FOUR) TIMES DAILY AS NEEDED FOR RASH .  UNKNOWN TO PATIENT, Inject as directed every 6 (six) months. 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  Vitamin D, Ergocalciferol, (DRISDOL) 1.25 MG (50000 UT) CAPS capsule, TAKE ONE CAPSULE BY MOUTH ONCE WEEKLY    Past medical history, social, surgical and family history all reviewed in electronic medical record.  No pertanent information unless stated regarding to the chief complaint.   Review of Systems:  No headache, visual changes, nausea, vomiting, diarrhea, constipation, dizziness, abdominal pain, skin rash, fevers, chills, night sweats, weight loss, swollen lymph nodes, body aches, joint swelling, muscle aches, chest pain, shortness of breath, mood changes.   Objective  Blood pressure 104/62, pulse 86, height 5' 4.5" (1.638 m), SpO2 98 %. Systems examined below as of    General: No apparent distress alert and oriented x3 mood and affect normal, dressed appropriately.  HEENT: Pupils equal, extraocular movements  intact  Respiratory: Patient's speak in full sentences and does not appear short of breath  Cardiovascular: No lower extremity edema, non tender, no erythema  Skin: Warm dry intact with no signs of infection or rash on extremities or on axial skeleton.  Abdomen: Soft nontender  Neuro: Cranial nerves II through XII are intact, neurovascularly intact in all extremities with 2+ DTRs and 2+ pulses.  Lymph: No lymphadenopathy of posterior or anterior cervical chain or axillae bilaterally.  Gait antalgic MSK:  tender with limited range of motion and stability and symmetric  strength and tone of shoulders, elbows, wrist, hip, and ankles bilaterally.  Arthritic changes of multiple joints  Knee: Bilateral valgus deformity noted. Large thigh to calf ratio.  Tender to palpation over medial and PF joint line.  ROM full in flexion and extension and lower leg rotation. instability with valgus force.  painful patellar compression. Patellar glide with moderate crepitus. Patellar and quadriceps tendons unremarkable. Hamstring and quadriceps strength is normal.   After informed written and verbal consent, patient was seated on exam table. Right knee was prepped with alcohol swab and utilizing anterolateral approach, patient's right knee space was injected with 2 cc of 0.5% Marcaine and pre-failed syringe of the durolene  procedure well without immediate complications.  After informed written and verbal consent, patient was seated on exam table. Left knee was prepped with alcohol swab and utilizing anterolateral approach, patient's left knee space was injected with 2 cc of 0.5% Marcaine and then pre-failed syringe of  Durolene. patient tolerated the procedure well without immediate complications.    Impression and Recommendations:     This case required medical decision making of moderate complexity. The above documentation has been reviewed and is accurate and complete Lyndal Pulley, DO       Note:  This dictation was prepared with Dragon dictation along with smaller phrase technology. Any transcriptional errors that result from this process are unintentional.

## 2018-11-16 ENCOUNTER — Other Ambulatory Visit: Payer: Self-pay

## 2018-11-16 ENCOUNTER — Encounter: Payer: Self-pay | Admitting: Family Medicine

## 2018-11-16 ENCOUNTER — Ambulatory Visit (INDEPENDENT_AMBULATORY_CARE_PROVIDER_SITE_OTHER): Payer: Medicare Other | Admitting: Family Medicine

## 2018-11-16 DIAGNOSIS — M170 Bilateral primary osteoarthritis of knee: Secondary | ICD-10-CM | POA: Diagnosis not present

## 2018-11-16 NOTE — Assessment & Plan Note (Signed)
Patient given greater narrowing.  Discussed icing regimen and home exercise.  Discussed that this can take weeks to work appropriately.  Patient was able to walk out though with decreased pain immediately.  Patient will follow-up with me again in 4 weeks as long as the coronavirus seems to have dissipated somewhat otherwise will follow-up as needed if doing better too.  Spent  25 minutes with patient face-to-face and had greater than 50% of counseling including as described above in assessment and plan.

## 2018-11-16 NOTE — Patient Instructions (Signed)
Good to see you  Be safe See me when you need me

## 2018-11-25 ENCOUNTER — Other Ambulatory Visit: Payer: Self-pay | Admitting: Endocrinology

## 2018-11-25 ENCOUNTER — Other Ambulatory Visit: Payer: Self-pay | Admitting: Family

## 2018-11-25 NOTE — Telephone Encounter (Signed)
Please forward refill request to pt's new primary care provider.  

## 2018-11-25 NOTE — Telephone Encounter (Signed)
Please refill if appropriate

## 2018-11-30 ENCOUNTER — Other Ambulatory Visit (INDEPENDENT_AMBULATORY_CARE_PROVIDER_SITE_OTHER): Payer: Medicare Other

## 2018-11-30 ENCOUNTER — Other Ambulatory Visit: Payer: Self-pay

## 2018-11-30 ENCOUNTER — Encounter: Payer: Self-pay | Admitting: Family

## 2018-11-30 ENCOUNTER — Ambulatory Visit (INDEPENDENT_AMBULATORY_CARE_PROVIDER_SITE_OTHER): Payer: Medicare Other | Admitting: Family

## 2018-11-30 VITALS — BP 110/70 | HR 96 | Temp 98.4°F | Ht 64.5 in | Wt 172.6 lb

## 2018-11-30 DIAGNOSIS — M791 Myalgia, unspecified site: Secondary | ICD-10-CM

## 2018-11-30 DIAGNOSIS — R5383 Other fatigue: Secondary | ICD-10-CM

## 2018-11-30 DIAGNOSIS — E118 Type 2 diabetes mellitus with unspecified complications: Secondary | ICD-10-CM

## 2018-11-30 DIAGNOSIS — R350 Frequency of micturition: Secondary | ICD-10-CM | POA: Diagnosis not present

## 2018-11-30 LAB — CBC WITH DIFFERENTIAL/PLATELET
Basophils Absolute: 0 10*3/uL (ref 0.0–0.1)
Basophils Relative: 0.7 % (ref 0.0–3.0)
Eosinophils Absolute: 0.1 10*3/uL (ref 0.0–0.7)
Eosinophils Relative: 1 % (ref 0.0–5.0)
HCT: 38.3 % (ref 36.0–46.0)
Hemoglobin: 12.8 g/dL (ref 12.0–15.0)
Lymphocytes Relative: 37.9 % (ref 12.0–46.0)
Lymphs Abs: 2.5 10*3/uL (ref 0.7–4.0)
MCHC: 33.4 g/dL (ref 30.0–36.0)
MCV: 84 fl (ref 78.0–100.0)
Monocytes Absolute: 0.8 10*3/uL (ref 0.1–1.0)
Monocytes Relative: 11.7 % (ref 3.0–12.0)
Neutro Abs: 3.3 10*3/uL (ref 1.4–7.7)
Neutrophils Relative %: 48.7 % (ref 43.0–77.0)
Platelets: 218 10*3/uL (ref 150.0–400.0)
RBC: 4.56 Mil/uL (ref 3.87–5.11)
RDW: 14.3 % (ref 11.5–15.5)
WBC: 6.7 10*3/uL (ref 4.0–10.5)

## 2018-11-30 LAB — URINALYSIS
Bilirubin Urine: NEGATIVE
Hgb urine dipstick: NEGATIVE
Ketones, ur: NEGATIVE
Leukocytes,Ua: NEGATIVE
Nitrite: NEGATIVE
Specific Gravity, Urine: 1.01 (ref 1.000–1.030)
Total Protein, Urine: NEGATIVE
Urine Glucose: NEGATIVE
Urobilinogen, UA: 0.2 (ref 0.0–1.0)
pH: 7 (ref 5.0–8.0)

## 2018-11-30 LAB — COMPREHENSIVE METABOLIC PANEL
ALT: 24 U/L (ref 0–35)
AST: 20 U/L (ref 0–37)
Albumin: 4 g/dL (ref 3.5–5.2)
Alkaline Phosphatase: 66 U/L (ref 39–117)
BUN: 9 mg/dL (ref 6–23)
CO2: 29 mEq/L (ref 19–32)
Calcium: 9.5 mg/dL (ref 8.4–10.5)
Chloride: 95 mEq/L — ABNORMAL LOW (ref 96–112)
Creatinine, Ser: 0.76 mg/dL (ref 0.40–1.20)
GFR: 90.18 mL/min (ref >60.00)
Glucose, Bld: 108 mg/dL — ABNORMAL HIGH (ref 70–99)
Potassium: 3.5 mEq/L (ref 3.5–5.1)
Sodium: 132 mEq/L — ABNORMAL LOW (ref 135–145)
Total Bilirubin: 0.4 mg/dL (ref 0.2–1.2)
Total Protein: 7.4 g/dL (ref 6.0–8.3)

## 2018-11-30 LAB — MAGNESIUM: Magnesium: 1.9 mg/dL (ref 1.5–2.5)

## 2018-11-30 LAB — TSH: TSH: 1.56 u[IU]/mL (ref 0.35–4.50)

## 2018-11-30 LAB — HEMOGLOBIN A1C: Hgb A1c MFr Bld: 6.5 % (ref 4.6–6.5)

## 2018-11-30 LAB — VITAMIN B12: Vitamin B-12: 483 pg/mL (ref 211–911)

## 2018-11-30 NOTE — Progress Notes (Signed)
Amy Escobar is a 73 y.o. female with the following history as recorded in Epic[facility]:  Patient Active Problem List   Diagnosis Date Noted  . Upper airway cough syndrome 08/22/2018  . Greater trochanteric bursitis of both hips 06/15/2018  . Trigger point of shoulder region, left 02/07/2018  . Hypertension   . Mild pulmonary hypertension ([place])   . NSVT (nonsustained ventricular tachycardia) ([place])   . PVC's (premature ventricular contractions)   . URI (upper respiratory infection) 08/09/2016  . Neck pain 02/10/2016  . Urinary incontinence 02/10/2016  . Degenerative arthritis of knee, bilateral 07/17/2015  . Knee MCL sprain 06/07/2015  . Discomfort in chest 02/15/2015  . Chest pain 02/15/2015  . DM type 2, controlled, with complication ([place])   . Wellness examination 08/06/2014  . Acute meniscal tear of knee 02/13/2014  . Primary localized osteoarthrosis, lower leg 02/13/2014  . Gastrocnemius tear 12/25/2013  . UTI (urinary tract infection) 12/20/2013  . Right knee pain 12/20/2013  . Pulmonary hypertension ([place]) 10/06/2013  . DOE (dyspnea on exertion) 08/04/2013  . Dysphagia, unspecified(787.20) 08/10/2012  . Hip pain, left 02/02/2012  . Painful respiration 05/25/2011  . Encounter for long-term (current) use of other medications 01/30/2011  . PELVIC PAIN, LEFT 07/30/2010  . TOBACCO USE, QUIT 07/07/2010  . FATIGUE 12/20/2009  . Headache 12/20/2009  . Low back pain 12/26/2008  . Disorder of liver 04/13/2008  . FOOT PAIN, BILATERAL 04/13/2008  . FIBROIDS, UTERUS 11/24/2007  . THYROID NODULE, LEFT 11/24/2007  . HYPERCHOLESTEROLEMIA 11/24/2007  . CARPAL TUNNEL SYNDROME, BILATERAL 11/24/2007  . ALKALINE PHOSPHATASE, ELEVATED 11/24/2007  . Gout 08/10/2007  . ANXIETY 08/10/2007  . Essential hypertension 08/10/2007  . Cough 08/01/2007  . Allergic rhinitis 01/14/2007  . GERD 01/14/2007  . Osteoporosis 01/14/2007    Current Outpatient Medications  Medication Sig Dispense  Refill  . amLODipine (NORVASC) 2.5 MG tablet Take 1 tablet (2.5 mg total) by mouth daily. 90 tablet 2  . aspirin EC 81 MG tablet Take 81 mg by mouth daily.    . benzonatate (TESSALON) 200 MG capsule Take 1 capsule (200 mg total) by mouth 3 (three) times daily as needed for cough. 45 capsule 2  . cetirizine (ZYRTEC) 10 MG tablet Take 10 mg by mouth daily.    . Chlorphen-Pseudoephed-APAP (CORICIDIN D PO) Take by mouth.    . esomeprazole (NEXIUM) 40 MG capsule Take 30- 60 min before your first and last meals of the day 180 capsule 2  . fesoterodine (TOVIAZ) 4 MG TB24 tablet Take 4 mg daily by mouth.    Marland Kitchen glucose blood (ONE TOUCH ULTRA TEST) test strip Use to monitor glucose levels daily; E11.8 100 each 1  . hydrochlorothiazide (MICROZIDE) 12.5 MG capsule Take 1 capsule (12.5 mg total) by mouth daily. 90 capsule 1  . metFORMIN (GLUCOPHAGE-XR) 500 MG 24 hr tablet TAKE TWO TABLETS BY MOUTH TWO TIMES A DAY 360 tablet 0  . rosuvastatin (CRESTOR) 5 MG tablet TAKE ONE TABLET BY MOUTH DAILY 90 tablet 1  . triamcinolone cream (KENALOG) 0.1 % APPLY 1 APPLICATION TOPICALLY 4 (FOUR) TIMES DAILY AS NEEDED FOR RASH 45 g 1  . UNKNOWN TO PATIENT Inject as directed every 6 (six) months. 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 valsartan (DIOVAN) 80 MG tablet Take 1 tablet (80 mg total) by mouth daily. 90 tablet 1  . Vitamin D, Ergocalciferol, (DRISDOL) 1.25 MG (50000 UT) CAPS capsule TAKE ONE CAPSULE BY MOUTH ONCE WEEKLY 12  capsule 0   No current facility-administered medications for this visit.     Allergies: Olmesartan medoxomil  Past Medical History:  Diagnosis Date  . Alkaline phosphatase deficiency    w/u Ne  . Allergic rhinitis   . Anxiety   . Arthritis   . DM2 (diabetes mellitus, type 2) ([place])   . GERD (gastroesophageal reflux disease)   . Gout   . Hemorrhoids   . Hyperlipidemia   . Hypertension   . Mild pulmonary hypertension ([place])   . NSVT (nonsustained ventricular tachycardia)  ([place])   . Osteoporosis   . PVC's (premature ventricular contractions)   . Thyroid nodule    small  . Uterine fibroid     Past Surgical History:  Procedure Laterality Date  . CATARACT EXTRACTION Bilateral 2016  . COLONOSCOPY  2007  . echocardiogram (other)  01/16/2002  . 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 stress cardiolite  02/12/2006  . TOENAIL EXCISION    . TUBAL LIGATION      Family History  Problem Relation Age of Onset  . Heart disease Mother   . Heart disease Father   . Heart disease Maternal Grandfather   . Rectal cancer Maternal Grandfather   . Stomach cancer Maternal Grandmother   . Colon cancer Neg Hx   . Breast cancer Neg Hx     Social History   Tobacco Use  . Smoking status: Former Smoker    Packs/day: 0.25    Years: 5.00    Pack years: 1.25    Types: Cigarettes    Last attempt to quit: 06/29/1968    Years since quitting: 50.4  . Smokeless tobacco: Never Used  Substance Use Topics  . Alcohol use: No    Alcohol/week: 0.0 standard drinks    Subjective:  Patient presents with concerns for increased fatigue x 3-4 weeks. Is concerned that she may have a UTI- feels like she is urinating more frequently; does feel that she is sleeping well; is checking her blood sugars daily and "they are normal." Denies any chest pain, shortness of breath, blurred vision or headache. Notes that appetite is normal; no night sweats or nausea;  Of note, has been referred for a sleep study in February 2020 but this has been re-scheduled due to [place].    Objective:  Vitals:   11/30/18 1158  BP: 110/70  Pulse: 96  Temp: 98.4 F (36.9 C)  TempSrc: Oral  SpO2: 98%  Weight: 172 lb 9.6 oz (78.3 kg)  Height: 5' 4.5" (1.638 m)    General: Well developed, well nourished, in no acute distress  Skin : Warm and dry.  Head: Normocephalic and atraumatic  Eyes: Sclera and conjunctiva clear; pupils round and reactive to light; extraocular movements intact  Ears: External normal;  canals clear; tympanic membranes normal  Oropharynx: Pink, supple. No suspicious lesions  Neck: Supple without thyromegaly, adenopathy  Lungs: Respirations unlabored; clear to auscultation bilaterally without wheeze, rales, rhonchi  CVS exam: normal rate and regular rhythm.  Abdomen: Soft; nontender; nondistended; normoactive bowel sounds; no masses or hepatosplenomegaly  Musculoskeletal: No deformities; no active joint inflammation  Extremities: No edema, cyanosis, clubbing  Vessels: Symmetric bilaterally  Neurologic: Alert and oriented; speech intact; face symmetrical; moves all extremities well; CNII-XII intact without focal deficit   Assessment:  1. Other fatigue   2. DM type 2, controlled, with complication ([place])   3. Myalgia   4. Urinary frequency     Plan:  Will update labs  and follow-up to be determined; encouraged to consider re-scheduling her sleep study;  May also need to consider referral back to her cardiologist.   No follow-ups on file.  Orders Placed This Encounter  Procedures  . Urine Culture    Standing Status:   Future    Standing Expiration Date:   11/30/2019  . CBC w/Diff    Standing Status:   Future    Standing Expiration Date:   11/30/2019  . Comp Met (CMET)    Standing Status:   Future    Standing Expiration Date:   11/30/2019  . HgB A1c    Standing Status:   Future    Standing Expiration Date:   11/30/2019  . TSH    Standing Status:   Future    Standing Expiration Date:   11/30/2019  . B12    Standing Status:   Future    Standing Expiration Date:   11/30/2019  . Urinalysis    Standing Status:   Future    Standing Expiration Date:   11/30/2019  . Magnesium    Standing Status:   Future    Standing Expiration Date:   11/30/2019    Requested Prescriptions    No prescriptions requested or ordered in this encounter

## 2018-12-01 LAB — URINE CULTURE
MICRO NUMBER:: 532955
Result:: NO GROWTH
SPECIMEN QUALITY:: ADEQUATE

## 2018-12-05 ENCOUNTER — Other Ambulatory Visit: Payer: Self-pay | Admitting: Endocrinology

## 2018-12-06 NOTE — Telephone Encounter (Signed)
Please forward refill request to pt's new primary care provider.  

## 2018-12-22 DIAGNOSIS — H3522 Other non-diabetic proliferative retinopathy, left eye: Secondary | ICD-10-CM | POA: Diagnosis not present

## 2018-12-22 DIAGNOSIS — H3521 Other non-diabetic proliferative retinopathy, right eye: Secondary | ICD-10-CM | POA: Diagnosis not present

## 2018-12-22 DIAGNOSIS — H33101 Unspecified retinoschisis, right eye: Secondary | ICD-10-CM | POA: Diagnosis not present

## 2018-12-22 DIAGNOSIS — E113392 Type 2 diabetes mellitus with moderate nonproliferative diabetic retinopathy without macular edema, left eye: Secondary | ICD-10-CM | POA: Diagnosis not present

## 2018-12-25 ENCOUNTER — Ambulatory Visit (INDEPENDENT_AMBULATORY_CARE_PROVIDER_SITE_OTHER): Payer: Medicare Other | Admitting: Neurology

## 2018-12-25 ENCOUNTER — Other Ambulatory Visit: Payer: Self-pay | Admitting: Endocrinology

## 2018-12-25 ENCOUNTER — Other Ambulatory Visit: Payer: Self-pay | Admitting: Cardiology

## 2018-12-25 DIAGNOSIS — R0683 Snoring: Secondary | ICD-10-CM

## 2018-12-25 DIAGNOSIS — I4729 Other ventricular tachycardia: Secondary | ICD-10-CM

## 2018-12-25 DIAGNOSIS — I10 Essential (primary) hypertension: Secondary | ICD-10-CM

## 2018-12-25 DIAGNOSIS — R351 Nocturia: Secondary | ICD-10-CM

## 2018-12-25 DIAGNOSIS — G4719 Other hypersomnia: Secondary | ICD-10-CM

## 2018-12-25 DIAGNOSIS — G4710 Hypersomnia, unspecified: Secondary | ICD-10-CM

## 2018-12-25 DIAGNOSIS — E782 Mixed hyperlipidemia: Secondary | ICD-10-CM

## 2018-12-25 DIAGNOSIS — R072 Precordial pain: Secondary | ICD-10-CM

## 2018-12-25 DIAGNOSIS — E663 Overweight: Secondary | ICD-10-CM

## 2018-12-25 DIAGNOSIS — I2510 Atherosclerotic heart disease of native coronary artery without angina pectoris: Secondary | ICD-10-CM

## 2018-12-25 DIAGNOSIS — R519 Headache, unspecified: Secondary | ICD-10-CM

## 2018-12-25 DIAGNOSIS — R0609 Other forms of dyspnea: Secondary | ICD-10-CM

## 2018-12-25 DIAGNOSIS — R0689 Other abnormalities of breathing: Secondary | ICD-10-CM

## 2018-12-25 DIAGNOSIS — G4720 Circadian rhythm sleep disorder, unspecified type: Secondary | ICD-10-CM

## 2018-12-25 DIAGNOSIS — I472 Ventricular tachycardia: Secondary | ICD-10-CM

## 2018-12-25 NOTE — Telephone Encounter (Signed)
Please forward refill request to pt's new primary care provider.  

## 2018-12-27 ENCOUNTER — Telehealth: Payer: Self-pay | Admitting: Family

## 2018-12-27 NOTE — Telephone Encounter (Signed)
Patient has broken out on face, neck, chest and a little bit down the arms and hands.  Patient is requesting a referral to dermatologist. Patient call back [phone]

## 2018-12-27 NOTE — Telephone Encounter (Signed)
Can you see if she can clarify this? I don't mind having her see a dermatologist but it may be something that we could treat for her.

## 2018-12-27 NOTE — Telephone Encounter (Signed)
Spoke with patient.  Appointment made for her to come in tomorrow to be evaluated.

## 2018-12-28 ENCOUNTER — Ambulatory Visit (INDEPENDENT_AMBULATORY_CARE_PROVIDER_SITE_OTHER): Payer: Medicare Other | Admitting: Family

## 2018-12-28 ENCOUNTER — Other Ambulatory Visit: Payer: Self-pay

## 2018-12-28 ENCOUNTER — Telehealth: Payer: Self-pay

## 2018-12-28 ENCOUNTER — Encounter: Payer: Self-pay | Admitting: Family

## 2018-12-28 VITALS — BP 102/70 | HR 98 | Temp 98.5°F | Ht 64.5 in | Wt 170.6 lb

## 2018-12-28 DIAGNOSIS — R21 Rash and other nonspecific skin eruption: Secondary | ICD-10-CM

## 2018-12-28 MED ORDER — FAMOTIDINE 20 MG PO TABS: 20.0000 mg | ORAL_TABLET | Freq: Two times a day (BID) | ORAL | 0 refills | Status: DC

## 2018-12-28 MED ORDER — PREDNISONE 20 MG PO TABS: 20.0000 mg | ORAL_TABLET | Freq: Every day | ORAL | 0 refills | Status: DC

## 2018-12-28 NOTE — Procedures (Signed)
PATIENT'S NAME:  Amy Escobar, Amy Escobar DOB:      1945/07/11      MR#:    [id number]     DATE OF RECORDING: 12/25/2018 REFERRING M.D.:  [name], FNP Study Performed:   Baseline Polysomnogram HISTORY: 73 year old woman with a history of degenerative arthritis, diabetes, pulmonary hypertension, hypertension, NSVT, PVCs, hyperlipidemia, gout, anxiety, allergic rhinitis, osteoporosis, reflux disease, and overweight state, who reports snoring and excessive daytime somnolence. The patient endorsed the Epworth Sleepiness Scale at 11/24 points. The patient's weight 165 pounds with a height of 64 (inches), resulting in a BMI of 28.2 kg/m2. The patient's neck circumference measured 14.5 inches.  CURRENT MEDICATIONS: Norvasc, Aspirin, Zyrtec, Nexium, Toviaz, Neurontin, Microzide, Glucophage, Crestor, Kenalog, Diovan, Drisdol, Vitamin D.   PROCEDURE:  This is a multichannel digital polysomnogram utilizing the Somnostar 11.2 system.  Electrodes and sensors were applied and monitored per AASM Specifications.   EEG, EOG, Chin and Limb EMG, were sampled at 200 Hz.  ECG, Snore and Nasal Pressure, Thermal Airflow, Respiratory Effort, CPAP Flow and Pressure, Oximetry was sampled at 50 Hz. Digital video and audio were recorded.      BASELINE STUDY  Lights Out was at 21:26 and Lights On at 04:56.  Total recording time (TRT) was 450.5 minutes, with a total sleep time (TST) of 346.5 minutes.   The patient's sleep latency was 62 minutes, which is delayed. REM latency was 168.5 minutes, which is delayed. The sleep efficiency was 76.9%, which is reduced.     SLEEP ARCHITECTURE: WASO (Wake after sleep onset) was 33 minutes with mild sleep fragmentation noted. There were 3 minutes in Stage N1, 201.5 minutes Stage N2, 83 minutes Stage N3 and 59 minutes in Stage REM.  The percentage of Stage N1 was .9%, Stage N2 was 58.2%, which is mildly increased, Stage N3 was 24.%, which is mildly increased, and Stage R (REM sleep)  was 17.%, which is mildly reduced.   RESPIRATORY ANALYSIS:  There were a total of 13 respiratory events:  0 obstructive apneas, 0 central apneas and 0 mixed apneas with a total of 0 apneas and an apnea index (AI) of 0 /hour. There were 13 hypopneas with a hypopnea index of 2.3 /hour. The patient also had 0 respiratory event related arousals (RERAs).      The total APNEA/HYPOPNEA INDEX (AHI) was 2.3 /hour and the total RESPIRATORY DISTURBANCE INDEX was 0. 2.3 /hour.  2 events occurred in REM sleep and 22 events in NREM. The REM AHI was 2. /hour, versus a non-REM AHI of 2.3. The patient spent 195.5 minutes of total sleep time in the supine position and 151 minutes in non-supine.. The supine AHI was 2.5 versus a non-supine AHI of 2.0.  OXYGEN SATURATION & C02:  The Wake baseline 02 saturation was 94%, with the lowest being 90% (82% on technical report was error, due to signal loss). Time spent below 89% saturation equaled 1 minutes.  PERIODIC LIMB MOVEMENTS: The patient had a total of 0 Periodic Limb Movements.  The Periodic Limb Movement (PLM) index was 0 and the PLM Arousal index was 0/hour. The arousals were noted as: 83 were spontaneous, 0 were associated with PLMs, 5 were associated with respiratory events.  Audio and video analysis did not show any abnormal or unusual movements, behaviors, phonations or vocalizations. The patient took 2 bathroom breaks. Soft, intermittent snoring was noted. She had intermittent cough. The EKG was in keeping with normal sinus rhythm (NSR).  Post-study, the patient indicated  that sleep was the same as usual.   IMPRESSION:  1. Primary Snoring 2. Dysfunctions associated with sleep stages or arousal from sleep  RECOMMENDATIONS:  1. This study does not demonstrate any significant obstructive or central sleep disordered breathing; only mild and intermittent snoring was noted. This study does not support an intrinsic sleep disorder as a cause of the patient's  symptoms. Other causes, including circadian rhythm disturbances, an underlying mood disorder, medication effect and/or an underlying medical problem cannot be ruled out. 2. This study shows sleep fragmentation and abnormal sleep stage percentages; these are nonspecific findings and per se do not signify an intrinsic sleep disorder or a cause for the patient's sleep-related symptoms. Causes include (but are not limited to) the first night effect of the sleep study, circadian rhythm disturbances, medication effect or an underlying mood disorder or medical problem.  3. The patient should be cautioned not to drive, work at heights, or operate dangerous or heavy equipment when tired or sleepy. Review and reiteration of good sleep hygiene measures should be pursued with any patient. 4. The patient will be advised to follow up with the referring provider, who will be notified of the test results.  I certify that I have reviewed the entire raw data recording prior to the issuance of this report in accordance with the Standards of Accreditation of the American Academy of Sleep Medicine (AASM)  Star Age, MD, PhD Diplomat, American Board of Neurology and Sleep Medicine (Neurology and Sleep Medicine)

## 2018-12-28 NOTE — Telephone Encounter (Signed)
-----   Message from Star Age, MD sent at 12/28/2018  8:08 AM EDT ----- Patient referred by Jodi Mourning, NP, seen by me on 08/16/18, diagnostic PSG on 12/25/18.   Please call and notify the patient that the recent sleep study did not show any significant obstructive sleep apnea. Mild intermittent snoring was noted.  Please remind patient to try to maintain good sleep hygiene, which means: Keep a regular sleep and wake schedule and make enough time for sleep (7 1/2 to 8 1/2 hours for the average adult), try not to exercise or have a meal within 2 hours of your bedtime, try to keep your bedroom conducive for sleep, that is, cool and dark, without light distractors such as an illuminated alarm clock, and refrain from watching TV right before sleep or in the middle of the night and do not keep the TV or radio on during the night. If a nightlight is used, have it away from the visual field. Also, try not to use or play on electronic devices at bedtime, such as your cell phone, tablet PC or laptop. If you like to read at bedtime on an electronic device, try to dim the background light as much as possible. Do not eat in the middle of the night. Keep pets away from the bedroom environment. For stress relief, try meditation, deep breathing exercises (there are many books and CDs available), a white noise machine or fan can help to diffuse other noise distractors, such as traffic noise. Do not drink alcohol before bedtime, as it can disturb sleep and cause middle of the night awakenings. Never mix alcohol and sedating medications! Avoid narcotic pain medication close to bedtime, as opioids/narcotics can suppress breathing drive and breathing effort.    She can FU with her referring provider.  Star Age, MD, PhD [facility])

## 2018-12-28 NOTE — Progress Notes (Signed)
Patient referred by Jodi Mourning, NP, seen by me on 08/16/18, diagnostic PSG on 12/25/18.   Please call and notify the patient that the recent sleep study did not show any significant obstructive sleep apnea. Mild intermittent snoring was noted.  Please remind patient to try to maintain good sleep hygiene, which means: Keep a regular sleep and wake schedule and make enough time for sleep (7 1/2 to 8 1/2 hours for the average adult), try not to exercise or have a meal within 2 hours of your bedtime, try to keep your bedroom conducive for sleep, that is, cool and dark, without light distractors such as an illuminated alarm clock, and refrain from watching TV right before sleep or in the middle of the night and do not keep the TV or radio on during the night. If a nightlight is used, have it away from the visual field. Also, try not to use or play on electronic devices at bedtime, such as your cell phone, tablet PC or laptop. If you like to read at bedtime on an electronic device, try to dim the background light as much as possible. Do not eat in the middle of the night. Keep pets away from the bedroom environment. For stress relief, try meditation, deep breathing exercises (there are many books and CDs available), a white noise machine or fan can help to diffuse other noise distractors, such as traffic noise. Do not drink alcohol before bedtime, as it can disturb sleep and cause middle of the night awakenings. Never mix alcohol and sedating medications! Avoid narcotic pain medication close to bedtime, as opioids/narcotics can suppress breathing drive and breathing effort.    She can FU with her referring provider.  Star Age, MD, PhD [facility])

## 2018-12-28 NOTE — Telephone Encounter (Signed)
I called pt. I advised pt that Dr. Rexene Alberts reviewed pt's sleep study and found that pt did not show any significant osa. Dr. Rexene Alberts recommends that pt follow up with her PCP. I reviewed sleep hygiene recommendations with the pt, including trying to keep a regular sleep wake schedule, avoiding electronics in the bedroom, keeping the bedroom cool, dark, and quiet, and avoiding eating or exercising within 2 hours of bedtime as well as eating in the middle of the night. I advised pt to keep pets out of the bedroom. I discussed with pt the importance of stress relief and to try meditation, deep breathing exercises, and/or a white noise machine or fan to diffuse other noise distractors. I advised pt to not drink alcohol before bedtime and to never mix alcohol and sedating medications. Pt was advised to avoid narcotic pain medication close to bedtime. I advised pt that a copy of these sleep study results will be sent to Jodi Mourning, NP. Pt verbalized understanding of results. Pt had no questions at this time but was encouraged to call back if questions arise.

## 2018-12-28 NOTE — Progress Notes (Signed)
Amy Escobar is a 73 y.o. female with the following history as recorded in EpicCare:  Patient Active Problem List   Diagnosis Date Noted  . Upper airway cough syndrome 08/22/2018  . Greater trochanteric bursitis of both hips 06/15/2018  . Trigger point of shoulder region, left 02/07/2018  . Hypertension   . Mild pulmonary hypertension ([place])   . NSVT (nonsustained ventricular tachycardia) ([place])   . PVC's (premature ventricular contractions)   . URI (upper respiratory infection) 08/09/2016  . Neck pain 02/10/2016  . Urinary incontinence 02/10/2016  . Degenerative arthritis of knee, bilateral 07/17/2015  . Knee MCL sprain 06/07/2015  . Discomfort in chest 02/15/2015  . Chest pain 02/15/2015  . DM type 2, controlled, with complication ([place])   . Wellness examination 08/06/2014  . Acute meniscal tear of knee 02/13/2014  . Primary localized osteoarthrosis, lower leg 02/13/2014  . Gastrocnemius tear 12/25/2013  . UTI (urinary tract infection) 12/20/2013  . Right knee pain 12/20/2013  . Pulmonary hypertension ([place]) 10/06/2013  . DOE (dyspnea on exertion) 08/04/2013  . Dysphagia, unspecified(787.20) 08/10/2012  . Hip pain, left 02/02/2012  . Painful respiration 05/25/2011  . Encounter for long-term (current) use of other medications 01/30/2011  . PELVIC PAIN, LEFT 07/30/2010  . TOBACCO USE, QUIT 07/07/2010  . FATIGUE 12/20/2009  . Headache 12/20/2009  . Low back pain 12/26/2008  . Disorder of liver 04/13/2008  . FOOT PAIN, BILATERAL 04/13/2008  . FIBROIDS, UTERUS 11/24/2007  . THYROID NODULE, LEFT 11/24/2007  . HYPERCHOLESTEROLEMIA 11/24/2007  . CARPAL TUNNEL SYNDROME, BILATERAL 11/24/2007  . ALKALINE PHOSPHATASE, ELEVATED 11/24/2007  . Gout 08/10/2007  . ANXIETY 08/10/2007  . Essential hypertension 08/10/2007  . Cough 08/01/2007  . Allergic rhinitis 01/14/2007  . GERD 01/14/2007  . Osteoporosis 01/14/2007    Current Outpatient Medications  Medication Sig Dispense  Refill  . amLODipine (NORVASC) 2.5 MG tablet TAKE ONE TABLET BY MOUTH DAILY 90 tablet 0  . aspirin EC 81 MG tablet Take 81 mg by mouth daily.    . Chlorphen-Pseudoephed-APAP (CORICIDIN D PO) Take by mouth.    . chlorpheniramine (CHLOR-TRIMETON) 4 MG tablet Take 4 mg by mouth 2 (two) times daily as needed for allergies.    Marland Kitchen esomeprazole (NEXIUM) 40 MG capsule Take 30- 60 min before your first and last meals of the day 180 capsule 2  . fesoterodine (TOVIAZ) 4 MG TB24 tablet Take 4 mg daily by mouth.    . gabapentin (NEURONTIN) 600 MG tablet     . glucose blood (ONE TOUCH ULTRA TEST) test strip Use to monitor glucose levels daily; E11.8 100 each 1  . hydrochlorothiazide (MICROZIDE) 12.5 MG capsule TAKE ONE CAPSULE BY MOUTH DAILY 90 capsule 0  . metFORMIN (GLUCOPHAGE-XR) 500 MG 24 hr tablet TAKE TWO TABLETS BY MOUTH TWO TIMES A DAY 360 tablet 0  . rosuvastatin (CRESTOR) 5 MG tablet TAKE ONE TABLET BY MOUTH DAILY 90 tablet 0  . triamcinolone cream (KENALOG) 0.1 % APPLY 1 APPLICATION TOPICALLY 4 (FOUR) TIMES DAILY AS NEEDED FOR RASH 45 g 1  . UNKNOWN TO PATIENT Inject as directed every 6 (six) months. 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 valsartan (DIOVAN) 80 MG tablet Take 1 tablet (80 mg total) by mouth daily. 90 tablet 1  . vitamin B-12 (CYANOCOBALAMIN) 1000 MCG tablet Take 1,000 mcg by mouth daily.    . Vitamin D, Ergocalciferol, (DRISDOL) 1.25 MG (50000 UT) CAPS capsule TAKE ONE CAPSULE BY MOUTH ONCE WEEKLY  12 capsule 0  . benzonatate (TESSALON) 200 MG capsule Take 1 capsule (200 mg total) by mouth 3 (three) times daily as needed for cough. (Patient not taking: Reported on 12/28/2018) 45 capsule 2  . famotidine (PEPCID) 20 MG tablet Take 1 tablet (20 mg total) by mouth 2 (two) times daily. 20 tablet 0  . predniSONE (DELTASONE) 20 MG tablet Take 1 tablet (20 mg total) by mouth daily with breakfast. 5 tablet 0   No current facility-administered medications for this visit.      Allergies: Olmesartan medoxomil  Past Medical History:  Diagnosis Date  . Alkaline phosphatase deficiency    w/u Ne  . Allergic rhinitis   . Anxiety   . Arthritis   . DM2 (diabetes mellitus, type 2) ([place])   . GERD (gastroesophageal reflux disease)   . Gout   . Hemorrhoids   . Hyperlipidemia   . Hypertension   . Mild pulmonary hypertension ([place])   . NSVT (nonsustained ventricular tachycardia) ([place])   . Osteoporosis   . PVC's (premature ventricular contractions)   . Thyroid nodule    small  . Uterine fibroid     Past Surgical History:  Procedure Laterality Date  . CATARACT EXTRACTION Bilateral 2016  . COLONOSCOPY  2007  . echocardiogram (other)  01/16/2002  . 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 stress cardiolite  02/12/2006  . TOENAIL EXCISION    . TUBAL LIGATION      Family History  Problem Relation Age of Onset  . Heart disease Mother   . Heart disease Father   . Heart disease Maternal Grandfather   . Rectal cancer Maternal Grandfather   . Stomach cancer Maternal Grandmother   . Colon cancer Neg Hx   . Breast cancer Neg Hx     Social History   Tobacco Use  . Smoking status: Former Smoker    Packs/day: 0.25    Years: 5.00    Pack years: 1.25    Types: Cigarettes    Quit date: 06/29/1968    Years since quitting: 50.5  . Smokeless tobacco: Never Used  Substance Use Topics  . Alcohol use: No    Alcohol/week: 0.0 standard drinks    Subjective:  Patient complaining of itchy rash on forearms/ upper neck and back x 1 week; has changed her soap in the past week- started using Dial; no concerns for tick exposure or plant exposure; currently taking OTC antihistamine as directed by her pulmonologist;      Objective:  Vitals:   12/28/18 0842  BP: 102/70  Pulse: 98  Temp: 98.5 F (36.9 C)  TempSrc: Oral  SpO2: 98%  Weight: 170 lb 9.6 oz (77.4 kg)  Height: 5' 4.5" (1.638 m)    General: Well developed, well nourished, in no acute distress  Skin : Warm  and dry. Papular lesions/ erythema noted on forehead/ left forearm Head: Normocephalic and atraumatic  Lungs: Respirations unlabored;  Neurologic: Alert and oriented; speech intact; face symmetrical;   Assessment:  1. Rash     Plan:  ? Allergic reaction to new soap; d/c Dial and change back to [place]; Rx for Prednisone 20 mg qd x 5 days, Pepcid 20 mg bid and continue OTC anti-histamine; follow-up worse, no better.    No follow-ups on file.  No orders of the defined types were placed in this encounter.   Requested Prescriptions   Signed Prescriptions Disp Refills  . famotidine (PEPCID) 20 MG tablet 20 tablet 0  Sig: Take 1 tablet (20 mg total) by mouth 2 (two) times daily.  . predniSONE (DELTASONE) 20 MG tablet 5 tablet 0    Sig: Take 1 tablet (20 mg total) by mouth daily with breakfast.

## 2018-12-29 ENCOUNTER — Ambulatory Visit: Payer: Medicare Other | Admitting: Internal Medicine

## 2018-12-31 ENCOUNTER — Other Ambulatory Visit: Payer: Self-pay | Admitting: Family Medicine

## 2019-01-03 ENCOUNTER — Other Ambulatory Visit: Payer: Self-pay | Admitting: Family

## 2019-01-05 ENCOUNTER — Other Ambulatory Visit: Payer: Self-pay | Admitting: Cardiology

## 2019-01-05 DIAGNOSIS — E782 Mixed hyperlipidemia: Secondary | ICD-10-CM

## 2019-01-05 DIAGNOSIS — I2510 Atherosclerotic heart disease of native coronary artery without angina pectoris: Secondary | ICD-10-CM

## 2019-01-05 DIAGNOSIS — R072 Precordial pain: Secondary | ICD-10-CM

## 2019-01-05 DIAGNOSIS — I10 Essential (primary) hypertension: Secondary | ICD-10-CM

## 2019-01-09 ENCOUNTER — Other Ambulatory Visit: Payer: Self-pay

## 2019-01-10 ENCOUNTER — Telehealth: Payer: Self-pay | Admitting: Family

## 2019-01-10 NOTE — Telephone Encounter (Signed)
Spoke with patient today. She is still having outbreaks with the rash around her neck and behind her ears. Said itching yesterday was really bad and she felt like she wanted to claw her skin off. She asked for appointment tomorrow. Set her up at 10:00 am as she wasn't sure if she could make the 8:40 am with seeing Dr. Loanne Drilling at 7:30 in the morning.

## 2019-01-10 NOTE — Telephone Encounter (Signed)
Patient called and would like to talk to her PCP or her nurse. Patient did not want to give reason why. Please call patient back, thanks.

## 2019-01-11 ENCOUNTER — Ambulatory Visit (INDEPENDENT_AMBULATORY_CARE_PROVIDER_SITE_OTHER): Payer: Medicare Other | Admitting: Family

## 2019-01-11 ENCOUNTER — Encounter: Payer: Self-pay | Admitting: Family

## 2019-01-11 ENCOUNTER — Other Ambulatory Visit: Payer: Self-pay

## 2019-01-11 ENCOUNTER — Telehealth: Payer: Self-pay | Admitting: Endocrinology

## 2019-01-11 ENCOUNTER — Ambulatory Visit (INDEPENDENT_AMBULATORY_CARE_PROVIDER_SITE_OTHER): Payer: Medicare Other | Admitting: Endocrinology

## 2019-01-11 ENCOUNTER — Encounter: Payer: Self-pay | Admitting: Endocrinology

## 2019-01-11 VITALS — BP 110/78 | HR 94 | Temp 98.3°F | Ht 64.5 in | Wt 170.6 lb

## 2019-01-11 VITALS — BP 104/64 | HR 87 | Ht 64.5 in | Wt 170.0 lb

## 2019-01-11 DIAGNOSIS — E1159 Type 2 diabetes mellitus with other circulatory complications: Secondary | ICD-10-CM | POA: Diagnosis not present

## 2019-01-11 DIAGNOSIS — R21 Rash and other nonspecific skin eruption: Secondary | ICD-10-CM | POA: Diagnosis not present

## 2019-01-11 DIAGNOSIS — E118 Type 2 diabetes mellitus with unspecified complications: Secondary | ICD-10-CM

## 2019-01-11 DIAGNOSIS — I10 Essential (primary) hypertension: Secondary | ICD-10-CM | POA: Diagnosis not present

## 2019-01-11 MED ORDER — PREDNISONE 20 MG PO TABS: 20.0000 mg | ORAL_TABLET | Freq: Every day | ORAL | 0 refills | Status: DC

## 2019-01-11 MED ORDER — CANAGLIFLOZIN 100 MG PO TABS: 100.0000 mg | ORAL_TABLET | Freq: Every day | ORAL | 11 refills | Status: DC

## 2019-01-11 MED ORDER — FAMOTIDINE 20 MG PO TABS: 20.0000 mg | ORAL_TABLET | Freq: Two times a day (BID) | ORAL | 0 refills | Status: DC

## 2019-01-11 MED ORDER — GLUCOSE BLOOD VI STRP: 1.0000 | ORAL_STRIP | Freq: Every day | 12 refills | Status: DC

## 2019-01-11 MED ORDER — ONETOUCH VERIO W/DEVICE KIT: 1.0000 | PACK | Freq: Every day | 0 refills | Status: DC

## 2019-01-11 MED ORDER — ONETOUCH VERIO VI STRP: 1.0000 | ORAL_STRIP | Freq: Every day | 12 refills | Status: DC

## 2019-01-11 MED ORDER — ONETOUCH ULTRA MINI W/DEVICE KIT: 1.0000 | PACK | Freq: Once | 0 refills | Status: AC

## 2019-01-11 NOTE — Progress Notes (Signed)
Amy Escobar is a 73 y.o. female with the following history as recorded in EpicCare:  Patient Active Problem List   Diagnosis Date Noted  . Upper airway cough syndrome 08/22/2018  . Greater trochanteric bursitis of both hips 06/15/2018  . Trigger point of shoulder region, left 02/07/2018  . Hypertension   . Mild pulmonary hypertension ([place])   . NSVT (nonsustained ventricular tachycardia) ([place])   . PVC's (premature ventricular contractions)   . URI (upper respiratory infection) 08/09/2016  . Neck pain 02/10/2016  . Urinary incontinence 02/10/2016  . Degenerative arthritis of knee, bilateral 07/17/2015  . Knee MCL sprain 06/07/2015  . Discomfort in chest 02/15/2015  . Chest pain 02/15/2015  . DM type 2, controlled, with complication ([place])   . Wellness examination 08/06/2014  . Acute meniscal tear of knee 02/13/2014  . Primary localized osteoarthrosis, lower leg 02/13/2014  . Gastrocnemius tear 12/25/2013  . UTI (urinary tract infection) 12/20/2013  . Right knee pain 12/20/2013  . Pulmonary hypertension ([place]) 10/06/2013  . DOE (dyspnea on exertion) 08/04/2013  . Dysphagia, unspecified(787.20) 08/10/2012  . Hip pain, left 02/02/2012  . Painful respiration 05/25/2011  . Encounter for long-term (current) use of other medications 01/30/2011  . PELVIC PAIN, LEFT 07/30/2010  . TOBACCO USE, QUIT 07/07/2010  . FATIGUE 12/20/2009  . Headache 12/20/2009  . Low back pain 12/26/2008  . Disorder of liver 04/13/2008  . FOOT PAIN, BILATERAL 04/13/2008  . FIBROIDS, UTERUS 11/24/2007  . THYROID NODULE, LEFT 11/24/2007  . HYPERCHOLESTEROLEMIA 11/24/2007  . CARPAL TUNNEL SYNDROME, BILATERAL 11/24/2007  . ALKALINE PHOSPHATASE, ELEVATED 11/24/2007  . Gout 08/10/2007  . ANXIETY 08/10/2007  . Essential hypertension 08/10/2007  . Cough 08/01/2007  . Allergic rhinitis 01/14/2007  . GERD 01/14/2007  . Osteoporosis 01/14/2007    Current Outpatient Medications  Medication Sig Dispense  Refill  . amLODipine (NORVASC) 2.5 MG tablet TAKE ONE TABLET BY MOUTH DAILY 90 tablet 0  . aspirin EC 81 MG tablet Take 81 mg by mouth daily.    . Blood Glucose Monitoring Suppl (ONETOUCH VERIO) w/Device KIT 1 Device by Does not apply route daily. 1 kit 0  . canagliflozin (INVOKANA) 100 MG TABS tablet Take 1 tablet (100 mg total) by mouth daily before breakfast. 30 tablet 11  . Chlorphen-Pseudoephed-APAP (CORICIDIN D PO) Take by mouth.    . chlorpheniramine (CHLOR-TRIMETON) 4 MG tablet Take 4 mg by mouth 2 (two) times daily as needed for allergies.    Marland Kitchen esomeprazole (NEXIUM) 40 MG capsule Take 30- 60 min before your first and last meals of the day 180 capsule 2  . famotidine (PEPCID) 20 MG tablet Take 1 tablet (20 mg total) by mouth 2 (two) times daily. 20 tablet 0  . fesoterodine (TOVIAZ) 4 MG TB24 tablet Take 4 mg daily by mouth.    . gabapentin (NEURONTIN) 600 MG tablet     . glucose blood (ONETOUCH VERIO) test strip 1 each by Other route daily. 100 each 12  . metFORMIN (GLUCOPHAGE-XR) 500 MG 24 hr tablet TAKE TWO TABLETS BY MOUTH TWO TIMES A DAY 360 tablet 0  . rosuvastatin (CRESTOR) 5 MG tablet TAKE ONE TABLET BY MOUTH DAILY 90 tablet 0  . triamcinolone cream (KENALOG) 0.1 % APPLY 1 APPLICATION TOPICALLY 4 (FOUR) TIMES DAILY AS NEEDED FOR RASH 45 g 1  . UNKNOWN TO PATIENT Inject as directed every 6 (six) months. 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 valsartan (DIOVAN) 80 MG tablet TAKE  ONE TABLET BY MOUTH DAILY 90 tablet 0  . vitamin B-12 (CYANOCOBALAMIN) 1000 MCG tablet Take 1,000 mcg by mouth daily.    . Vitamin D, Ergocalciferol, (DRISDOL) 1.25 MG (50000 UT) CAPS capsule TAKE ONE CAPSULE BY MOUTH ONCE WEEKLY 12 capsule 0  . benzonatate (TESSALON) 200 MG capsule Take 1 capsule (200 mg total) by mouth 3 (three) times daily as needed for cough. (Patient not taking: Reported on 01/11/2019) 45 capsule 2  . predniSONE (DELTASONE) 20 MG tablet Take 1 tablet (20 mg total) by mouth  daily with breakfast. 5 tablet 0   No current facility-administered medications for this visit.     Allergies: Olmesartan medoxomil  Past Medical History:  Diagnosis Date  . Alkaline phosphatase deficiency    w/u Ne  . Allergic rhinitis   . Anxiety   . Arthritis   . DM2 (diabetes mellitus, type 2) ([place])   . GERD (gastroesophageal reflux disease)   . Gout   . Hemorrhoids   . Hyperlipidemia   . Hypertension   . Mild pulmonary hypertension ([place])   . NSVT (nonsustained ventricular tachycardia) ([place])   . Osteoporosis   . PVC's (premature ventricular contractions)   . Thyroid nodule    small  . Uterine fibroid     Past Surgical History:  Procedure Laterality Date  . CATARACT EXTRACTION Bilateral 2016  . COLONOSCOPY  2007  . echocardiogram (other)  01/16/2002  . 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 stress cardiolite  02/12/2006  . TOENAIL EXCISION    . TUBAL LIGATION      Family History  Problem Relation Age of Onset  . Heart disease Mother   . Heart disease Father   . Heart disease Maternal Grandfather   . Rectal cancer Maternal Grandfather   . Stomach cancer Maternal Grandmother   . Colon cancer Neg Hx   . Breast cancer Neg Hx     Social History   Tobacco Use  . Smoking status: Former Smoker    Packs/day: 0.25    Years: 5.00    Pack years: 1.25    Types: Cigarettes    Quit date: 06/29/1968    Years since quitting: 50.5  . Smokeless tobacco: Never Used  Substance Use Topics  . Alcohol use: No    Alcohol/week: 0.0 standard drinks    Subjective:  Patient presents with recurrent rash on neck, forearms, forehead; was treated for similar symptoms 2 weeks ago- thought to be related to new soap; responded initially to treatment but symptoms have returned/ persisted on forehead.      Objective:  Vitals:   01/11/19 1011  BP: 110/78  Pulse: 94  Temp: 98.3 F (36.8 C)  TempSrc: Oral  SpO2: 95%  Weight: 170 lb 9.6 oz (77.4 kg)  Height: 5' 4.5" (1.638 m)     General: Well developed, well nourished, in no acute distress  Skin : Warm and dry. Papular rash noted around neck/ on forehead Head: Normocephalic and atraumatic  Lungs: Respirations unlabored; clear to auscultation bilaterally without wheeze, rales, rhonchi  Neurologic: Alert and oriented; speech intact; face symmetrical; moves all extremities well; CNII-XII intact without focal deficit   Assessment:  1. Rash     Plan:  Re-treat with prednisone, Pepcid; continue OTC antihistamine; refer to dermatology- ? Hives;   No follow-ups on file.  Orders Placed This Encounter  Procedures  . Ambulatory referral to Dermatology    Referral Priority:   Routine    Referral Type:  Consultation    Referral Reason:   Specialty Services Required    Requested Specialty:   Dermatology    Number of Visits Requested:   1    Requested Prescriptions   Signed Prescriptions Disp Refills  . famotidine (PEPCID) 20 MG tablet 20 tablet 0    Sig: Take 1 tablet (20 mg total) by mouth 2 (two) times daily.  . predniSONE (DELTASONE) 20 MG tablet 5 tablet 0    Sig: Take 1 tablet (20 mg total) by mouth daily with breakfast.

## 2019-01-11 NOTE — Telephone Encounter (Signed)
Pt was seen today. Please advise if you would like to submit a refill for a new device

## 2019-01-11 NOTE — Telephone Encounter (Signed)
I have sent a prescription to your pharmacy  

## 2019-01-11 NOTE — Telephone Encounter (Signed)
Patient states she does not take Blood Glucose Monitoring Suppl (ONETOUCH VERIO) w/Device KIT she uses the mini.  Please Advise, Thanks

## 2019-01-11 NOTE — Progress Notes (Signed)
Subjective:    Patient ID: Amy Escobar, female    DOB: 03/10/1946, 73 y.o.   MRN: [id number]  HPI Pt returns for f/u of diabetes mellitus: DM type: 2 Dx'ed: 8466 Complications: CAD Therapy: metformin GDM: never.   DKA: never.   Severe hypoglycemia: never.   Pancreatitis: never.  Other: she has never been on insulin; she gets occasional steroid injections into the knees.   Interval history: pt states she feels well in general.  No recent steroids.   Past Medical History:  Diagnosis Date  . Alkaline phosphatase deficiency    w/u Ne  . Allergic rhinitis   . Anxiety   . Arthritis   . DM2 (diabetes mellitus, type 2) ([place])   . GERD (gastroesophageal reflux disease)   . Gout   . Hemorrhoids   . Hyperlipidemia   . Hypertension   . Mild pulmonary hypertension ([place])   . NSVT (nonsustained ventricular tachycardia) ([place])   . Osteoporosis   . PVC's (premature ventricular contractions)   . Thyroid nodule    small  . Uterine fibroid     Past Surgical History:  Procedure Laterality Date  . CATARACT EXTRACTION Bilateral 2016  . COLONOSCOPY  2007  . echocardiogram (other)  01/16/2002  . 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 stress cardiolite  02/12/2006  . TOENAIL EXCISION    . TUBAL LIGATION      Social History   Socioeconomic History  . Marital status: Widowed    Spouse name: Not on file  . Number of children: 2  . Years of education: Not on file  . Highest education level: Not on file  Occupational History  . Occupation: Surveyor, minerals: UNEMPLOYED  Social Needs  . Financial resource strain: Not on file  . Food insecurity    Worry: Not on file    Inability: Not on file  . Transportation needs    Medical: Not on file    Non-medical: Not on file  Tobacco Use  . Smoking status: Former Smoker    Packs/day: 0.25    Years: 5.00    Pack years: 1.25    Types: Cigarettes    Quit date: 06/29/1968    Years since quitting: 50.5  . Smokeless tobacco:  Never Used  Substance and Sexual Activity  . Alcohol use: No    Alcohol/week: 0.0 standard drinks  . Drug use: No  . Sexual activity: Not on file  Lifestyle  . Physical activity    Days per week: Not on file    Minutes per session: Not on file  . Stress: Not on file  Relationships  . Social Herbalist on phone: Not on file    Gets together: Not on file    Attends religious service: Not on file    Active member of club or organization: Not on file    Attends meetings of clubs or organizations: Not on file    Relationship status: Not on file  . Intimate partner violence    Fear of current or ex partner: Not on file    Emotionally abused: Not on file    Physically abused: Not on file    Forced sexual activity: Not on file  Other Topics Concern  . Not on file  Social History Narrative   Widowed 2010.     Current Outpatient Medications on File Prior to Visit  Medication Sig Dispense Refill  . amLODipine (NORVASC) 2.5  MG tablet TAKE ONE TABLET BY MOUTH DAILY 90 tablet 0  . aspirin EC 81 MG tablet Take 81 mg by mouth daily.    . benzonatate (TESSALON) 200 MG capsule Take 1 capsule (200 mg total) by mouth 3 (three) times daily as needed for cough. (Patient not taking: Reported on 01/11/2019) 45 capsule 2  . Chlorphen-Pseudoephed-APAP (CORICIDIN D PO) Take by mouth.    . chlorpheniramine (CHLOR-TRIMETON) 4 MG tablet Take 4 mg by mouth 2 (two) times daily as needed for allergies.    Marland Kitchen esomeprazole (NEXIUM) 40 MG capsule Take 30- 60 min before your first and last meals of the day 180 capsule 2  . fesoterodine (TOVIAZ) 4 MG TB24 tablet Take 4 mg daily by mouth.    . gabapentin (NEURONTIN) 600 MG tablet     . metFORMIN (GLUCOPHAGE-XR) 500 MG 24 hr tablet TAKE TWO TABLETS BY MOUTH TWO TIMES A DAY 360 tablet 0  . rosuvastatin (CRESTOR) 5 MG tablet TAKE ONE TABLET BY MOUTH DAILY 90 tablet 0  . triamcinolone cream (KENALOG) 0.1 % APPLY 1 APPLICATION TOPICALLY 4 (FOUR) TIMES DAILY AS  NEEDED FOR RASH 45 g 1  . UNKNOWN TO PATIENT Inject as directed every 6 (six) months. 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 valsartan (DIOVAN) 80 MG tablet TAKE ONE TABLET BY MOUTH DAILY 90 tablet 0  . vitamin B-12 (CYANOCOBALAMIN) 1000 MCG tablet Take 1,000 mcg by mouth daily.    . Vitamin D, Ergocalciferol, (DRISDOL) 1.25 MG (50000 UT) CAPS capsule TAKE ONE CAPSULE BY MOUTH ONCE WEEKLY 12 capsule 0   No current facility-administered medications on file prior to visit.     Allergies  Allergen Reactions  . Olmesartan Medoxomil     REACTION: headache    Family History  Problem Relation Age of Onset  . Heart disease Mother   . Heart disease Father   . Heart disease Maternal Grandfather   . Rectal cancer Maternal Grandfather   . Stomach cancer Maternal Grandmother   . Colon cancer Neg Hx   . Breast cancer Neg Hx     BP 104/64 (BP Location: Left Arm, Patient Position: Sitting, Cuff Size: Large)   Pulse 87   Ht 5' 4.5" (1.638 m)   Wt 170 lb (77.1 kg)   SpO2 91%   BMI 28.73 kg/m    Review of Systems She has gained weight.      Objective:   Physical Exam VITAL SIGNS:  See vs page GENERAL: no distress Pulses: dorsalis pedis intact bilat.   MSK: no deformity of the feet CV: no leg edema Skin:  no ulcer on the feet.  normal color and temp on the feet. Neuro: sensation is intact to touch on the feet.   Lab Results  Component Value Date   CREATININE 0.76 11/30/2018   BUN 9 11/30/2018   NA 132 (L) 11/30/2018   K 3.5 11/30/2018   CL 95 (L) 11/30/2018   CO2 29 11/30/2018    Lab Results  Component Value Date   HGBA1C 6.5 11/30/2018       Assessment & Plan:  Type 2 DM, with CAD: worse. HTN: she can tolerate discontinuation of HCTZ, which she should to in view of addition of invokana.   Patient Instructions  I have sent a prescription to your pharmacy, to add "Invokana." As this works as a fluid pill, you can stop taking the hydrochlorothiazide.  Please continue the same metformin. Please come back  for a follow-up appointment in 3 months.

## 2019-01-11 NOTE — Patient Instructions (Signed)
I have sent a prescription to your pharmacy, to add "Invokana." As this works as a fluid pill, you can stop taking the hydrochlorothiazide. Please continue the same metformin. Please come back for a follow-up appointment in 3 months.

## 2019-01-17 ENCOUNTER — Other Ambulatory Visit: Payer: Self-pay | Admitting: Family

## 2019-02-02 DIAGNOSIS — N130 Hydronephrosis with ureteropelvic junction obstruction: Secondary | ICD-10-CM | POA: Diagnosis not present

## 2019-02-02 DIAGNOSIS — R3914 Feeling of incomplete bladder emptying: Secondary | ICD-10-CM | POA: Diagnosis not present

## 2019-02-13 DIAGNOSIS — L309 Dermatitis, unspecified: Secondary | ICD-10-CM | POA: Diagnosis not present

## 2019-02-13 DIAGNOSIS — T7840XA Allergy, unspecified, initial encounter: Secondary | ICD-10-CM | POA: Diagnosis not present

## 2019-02-20 DIAGNOSIS — L239 Allergic contact dermatitis, unspecified cause: Secondary | ICD-10-CM | POA: Diagnosis not present

## 2019-02-23 ENCOUNTER — Encounter: Payer: Self-pay | Admitting: *Deleted

## 2019-02-23 ENCOUNTER — Other Ambulatory Visit: Payer: Self-pay

## 2019-02-23 ENCOUNTER — Ambulatory Visit (INDEPENDENT_AMBULATORY_CARE_PROVIDER_SITE_OTHER): Payer: Medicare Other | Admitting: Cardiology

## 2019-02-23 ENCOUNTER — Encounter: Payer: Self-pay | Admitting: Cardiology

## 2019-02-23 ENCOUNTER — Other Ambulatory Visit: Payer: Self-pay | Admitting: Endocrinology

## 2019-02-23 VITALS — BP 118/76 | HR 94 | Ht 64.5 in | Wt 168.0 lb

## 2019-02-23 DIAGNOSIS — I10 Essential (primary) hypertension: Secondary | ICD-10-CM | POA: Diagnosis not present

## 2019-02-23 DIAGNOSIS — R072 Precordial pain: Secondary | ICD-10-CM

## 2019-02-23 DIAGNOSIS — I2510 Atherosclerotic heart disease of native coronary artery without angina pectoris: Secondary | ICD-10-CM | POA: Diagnosis not present

## 2019-02-23 DIAGNOSIS — I272 Pulmonary hypertension, unspecified: Secondary | ICD-10-CM

## 2019-02-23 NOTE — Patient Instructions (Addendum)
Medication Instructions:   Your physician recommends that you continue on your current medications as directed. Please refer to the Current Medication list given to you today.  If you need a refill on your cardiac medications before your next appointment, please call your pharmacy.    Lab work:  YOU WILL NEED TO BE COVID SCREENED TESTED PRIOR TO YOUR EXERCISE MYOVIEW--THIS IS SCHEDULED FOR 9/8 AT 8:15 AM  If you have labs (blood work) drawn today and your tests are completely normal, you will receive your results only by: Marland Kitchen MyChart Message (if you have MyChart) OR . A paper copy in the mail If you have any lab test that is abnormal or we need to change your treatment, we will call you to review the results.   Testing/Procedures:  Your physician has requested that you have an echocardiogram. Echocardiography is a painless test that uses sound waves to create images of your heart. It provides your doctor with information about the size and shape of your heart and how well your heart's chambers and valves are working. This procedure takes approximately one hour. There are no restrictions for this procedure.   Your physician has requested that you have en exercise stress myoview. For further information please visit [email or web address]. Please follow instruction sheet, as given. PLEASE DO ON D-SPECT--03/09/19     Follow-Up: At [facility], you and your health needs are our priority.  As part of our continuing mission to provide you with exceptional heart care, we have created designated Provider Care Teams.  These Care Teams include your primary Cardiologist (physician) and Advanced Practice Providers (APPs -  Physician Assistants and Nurse Practitioners) who all work together to provide you with the care you need, when you need it. You will need a follow up appointment in 6 months.  Please call our office 2 months in advance to schedule this appointment.  You may see Ena Dawley, MD  or one of the following Advanced Practice Providers on your designated Care Team:   [place], PA-C Melina Copa, PA-C . Ermalinda Barrios, PA-C    *WHEN YOU REPORT FOR YOUR COVID TESTING, YOU WILL GO TO [facility] [address], [place]-ONCE YOU ARRIVE AT THE TESTING SITE, STAY IN THE RIGHT HAND LANE, GO UNDER THE BUILDING OVERHANG NOT THE TENT. IF YOU ARE TESTED UNDER THE TENT YOUR RESULTS MAY NOT BE BACK BEFORE YOUR PROCEDURE. PLEASE BE ON TIME FOR YOUR APPOINTMENT. AFTER YOUR SWAB YOU WILL BE GIVEN A MASK TO WEAR AND INSTRUCTED TO GO HOME AND QUARANTINE/NO VISITORS UNTIL AFTER YOUR PROCEDURE. IF YOU TEST POSITIVE YOU WILL BE NOTIFIED AND YOUR PROCEDURE WILL BE CANCELLED.    YOUR COVID SCREENING TEST IS SCHEDULED FOR 03/07/19 AT 8:15 AM--- PLEASE REPORT AND BE ON TIME FOR YOUR APPOINTMENT.

## 2019-02-23 NOTE — Progress Notes (Signed)
Patient ID: Emmaleah Meroney, female   DOB: 1945-08-14, 73 y.o.   MRN: [id number]    Patient Name: Amy Escobar Date of Encounter: 02/23/2019  Primary Care Provider:  [name], [place] Primary Cardiologist:  Ena Dawley  Problem List   Past Medical History:  Diagnosis Date  . Alkaline phosphatase deficiency    w/u Ne  . Allergic rhinitis   . Anxiety   . Arthritis   . DM2 (diabetes mellitus, type 2) ([place])   . GERD (gastroesophageal reflux disease)   . Gout   . Hemorrhoids   . Hyperlipidemia   . Hypertension   . Mild pulmonary hypertension ([place])   . NSVT (nonsustained ventricular tachycardia) ([place])   . Osteoporosis   . PVC's (premature ventricular contractions)   . Thyroid nodule    small  . Uterine fibroid    Past Surgical History:  Procedure Laterality Date  . CATARACT EXTRACTION Bilateral 2016  . COLONOSCOPY  2007  . echocardiogram (other)  01/16/2002  . 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 stress cardiolite  02/12/2006  . TOENAIL EXCISION    . TUBAL LIGATION     Allergies  Allergies  Allergen Reactions  . Olmesartan Medoxomil     REACTION: headache   HPI  73 year old with hypertension, hyperlipidemia, insulin-dependent diabetes mellitus, moderate pulmonary hypertension, known moderate nonobstructive CAD with coming after 1 year. She has been less active because of covid. She feels more SOB with exertion and has occasional episodes of atypical chest pain. She feels tired as well.   Home Medications  Prior to Admission medications   Medication Sig Start Date End Date Taking? Authorizing Provider  Blood Glucose Monitoring Suppl (ONE TOUCH ULTRA MINI) W/DEVICE KIT 1 Device by Does not apply route once. 11/25/12  Yes Renato Shin, MD  gabapentin (NEURONTIN) 300 MG capsule One three times a day 09/12/13  Yes Tanda Rockers, MD  naproxen (NAPROSYN) 500 MG tablet Take 1 tablet (500 mg total) by mouth 2 (two) times daily with a meal. 08/10/12  Yes  Renato Shin, MD  NEXIUM 40 MG capsule TAKE 1 CAPSULE (40 MG TOTAL) BY MOUTH DAILY BEFORE BREAKFAST.   Yes Renato Shin, MD  ONE TOUCH ULTRA TEST test strip TEST BLOOD SUGAR THREE(3) TIMES DAILY 06/24/13  Yes Renato Shin, MD  [facility] DELICA LANCETS 50Y Roby USE AS DIRECTED BY PRESCRIBER THREE TIMES A DAY   Yes Renato Shin, MD  simvastatin (ZOCOR) 80 MG tablet TAKE 1 TABLET BY MOUTH EVERY NIGHT AT BEDTIME   Yes Renato Shin, MD  valsartan-hydrochlorothiazide (DIOVAN-HCT) 160-12.5 MG per tablet TAKE 1 TABLET BY MOUTH DAILY 07/29/13  Yes Renato Shin, MD    Family History  Family History  Problem Relation Age of Onset  . Heart disease Mother   . Heart disease Father   . Heart disease Maternal Grandfather   . Rectal cancer Maternal Grandfather   . Stomach cancer Maternal Grandmother   . Colon cancer Neg Hx   . Breast cancer Neg Hx     Social History  Social History   Socioeconomic History  . Marital status: Widowed    Spouse name: Not on file  . Number of children: 2  . Years of education: Not on file  . Highest education level: Not on file  Occupational History  . Occupation: Surveyor, minerals: UNEMPLOYED  Social Needs  . Financial resource strain: Not on file  . Food insecurity  Worry: Not on file    Inability: Not on file  . Transportation needs    Medical: Not on file    Non-medical: Not on file  Tobacco Use  . Smoking status: Former Smoker    Packs/day: 0.25    Years: 5.00    Pack years: 1.25    Types: Cigarettes    Quit date: 06/29/1968    Years since quitting: 50.6  . Smokeless tobacco: Never Used  Substance and Sexual Activity  . Alcohol use: No    Alcohol/week: 0.0 standard drinks  . Drug use: No  . Sexual activity: Not on file  Lifestyle  . Physical activity    Days per week: Not on file    Minutes per session: Not on file  . Stress: Not on file  Relationships  . Social Herbalist on phone: Not on file    Gets together: Not on file     Attends religious service: Not on file    Active member of club or organization: Not on file    Attends meetings of clubs or organizations: Not on file    Relationship status: Not on file  . Intimate partner violence    Fear of current or ex partner: Not on file    Emotionally abused: Not on file    Physically abused: Not on file    Forced sexual activity: Not on file  Other Topics Concern  . Not on file  Social History Narrative   Widowed 2010.     Review of Systems, as per HPI, otherwise negative General:  No chills, fever, night sweats or weight changes.  Cardiovascular:  No chest pain, dyspnea on exertion, edema, orthopnea, palpitations, paroxysmal nocturnal dyspnea. Dermatological: No rash, lesions/masses Respiratory: No cough, dyspnea Urologic: No hematuria, dysuria Abdominal:   No nausea, vomiting, diarrhea, bright red blood per rectum, melena, or hematemesis Neurologic:  No visual changes, wkns, changes in mental status. All other systems reviewed and are otherwise negative except as noted above.  Physical Exam  Blood pressure 118/76, pulse 94, height 5' 4.5" (1.638 m), weight 168 lb (76.2 kg), SpO2 98 %.  General: Pleasant, NAD Psych: Normal affect. Neuro: Alert and oriented X 3. Moves all extremities spontaneously. HEENT: Normal  Neck: Supple without bruits or JVD. Lungs:  Resp regular and unlabored, CTA. Heart: RRR no s3, s4, or murmurs. Abdomen: Soft, non-tender, non-distended, BS + x 4.  Extremities: No clubbing, cyanosis or edema. DP/PT/Radials 2+ and equal bilaterally.  Labs:  No results for input(s): CKTOTAL, CKMB, TROPONINI in the last 72 hours. Lab Results  Component Value Date   WBC 6.7 11/30/2018   HGB 12.8 11/30/2018   HCT 38.3 11/30/2018   MCV 84.0 11/30/2018   PLT 218.0 11/30/2018    Lab Results  Component Value Date   DDIMER <0.27 02/15/2015   Invalid input(s): POCBNP    Component Value Date/Time   NA 132 (L) 11/30/2018 1226   NA 139  04/07/2017 0805   K 3.5 11/30/2018 1226   CL 95 (L) 11/30/2018 1226   CO2 29 11/30/2018 1226   GLUCOSE 108 (H) 11/30/2018 1226   BUN 9 11/30/2018 1226   BUN 14 04/07/2017 0805   CREATININE 0.76 11/30/2018 1226   CREATININE 0.73 06/08/2016 0801   CALCIUM 9.5 11/30/2018 1226   PROT 7.4 11/30/2018 1226   PROT 7.2 01/06/2018 0743   ALBUMIN 4.0 11/30/2018 1226   ALBUMIN 4.2 01/06/2018 0743   AST 20 11/30/2018 1226  ALT 24 11/30/2018 1226   ALKPHOS 66 11/30/2018 1226   BILITOT 0.4 11/30/2018 1226   BILITOT 0.3 01/06/2018 0743   GFRNONAA 65 04/07/2017 0805   GFRAA 74 04/07/2017 0805   Lab Results  Component Value Date   CHOL 133 07/04/2018   HDL 50.30 07/04/2018   LDLCALC 70 07/04/2018   TRIG 64.0 07/04/2018   Accessory Clinical Findings  Echocardiogram - 09/20/2013 - Left ventricle: The cavity size was normal. Wall thickness was normal. Systolic function was low normal to mildly reduced. The estimated ejection fraction was in the range of 50% to 55%. Wall motion was normal; there were no regional wall motion abnormalities. Doppler parameters are consistent with abnormal left ventricular relaxation (grade 1 diastolic dysfunction). - Aortic valve: There was no stenosis. - Mitral valve: Mildly calcified annulus. Normal thickness leaflets . Mild regurgitation. - Left atrium: The atrium was mildly dilated. - Right ventricle: The cavity size was normal. Systolic function was normal. - Tricuspid valve: Peak RV-RA gradient: 37m Hg (S). - Pulmonary arteries: PA peak pressure: 462mHg (S). - Systemic veins: IVC not visualized. Impressions:  - Normal LV size with low normal to mildly reduced systolic function, EF 5095-07%Normal RV size and systolic function. Mild MR. Mild pulmonary hypertension.  04/10/2014 Left ventricle: The cavity size was normal. Wall thickness was increased in a pattern of mild LVH. Systolic function was normal. The estimated ejection fraction was in  the range of 55% to 60%. Wall motion was normal; there were no regional wall motion abnormalities. Doppler parameters are consistent with abnormal left ventricular relaxation (grade 1 diastolic dysfunction). - Pulmonary arteries: Systolic pressure was mildly increased. PA peak pressure: 36 mm Hg (S).  Impressions: - Normal LV function; mild LVH; grade 1 diastolic dysfunction; mild TR; mildly elevated pulmonary pressure. Compared to 09/20/13, LV function remains normal.  Coronary CTA 11/18/2016 IMPRESSION: 1. Coronary calcium score of 220. This was 8164ercentile for age and sex matched control. 2. Normal coronary origin.  Right dominance. 3. Moderate plaque in the ostial LM and mild plaque in the ostial RCA, the study will be sent out for an additional analysis with CT FFR.    Assessment & Plan  1. DOE/ Chest pain -Known moderate nonobstructive CAD on coronary CT in 2018 including moderate left main lesion, we will obtain exercise nuclear stress test to evaluate for ischemia.   2.  Moderate pulmonary hypertension with worsening DOE -we will repeat echocardiogram.  3.  Hypertension -controlled  4.  Hyperlipidemia -Continue low-dose Crestor, most recent LFTs were normal.  Follow up in 6 months.   KaEna DawleyMD, [facility]ARose[facility] Center/27/2020, 3:28 PM

## 2019-03-03 ENCOUNTER — Ambulatory Visit (INDEPENDENT_AMBULATORY_CARE_PROVIDER_SITE_OTHER): Payer: Medicare Other | Admitting: Family Medicine

## 2019-03-03 ENCOUNTER — Encounter: Payer: Self-pay | Admitting: Family Medicine

## 2019-03-03 ENCOUNTER — Other Ambulatory Visit: Payer: Self-pay

## 2019-03-03 DIAGNOSIS — M170 Bilateral primary osteoarthritis of knee: Secondary | ICD-10-CM

## 2019-03-03 NOTE — Assessment & Plan Note (Signed)
Bilateral injections given today.  Tolerated the procedure well.  Patient did respond somewhat to the Visco supplementation but not significant enough that did not need steroid injections today.  Can repeat again in 10 weeks.  Patient will consider this.  Otherwise in November 20 would be able to do Visco supplementation again

## 2019-03-03 NOTE — Patient Instructions (Addendum)
Good to see you  You know the drill  See me again in 10 weeks unless you want the gel injections again

## 2019-03-03 NOTE — Progress Notes (Signed)
[facility] [address] [place], [place] [address] Phone: [phone] Subjective:       CC: Bilateral knee pain I Amy Escobar am serving as a Education administrator for Dr. Hulan Saas.  [id number]   11/16/2018 Patient given greater narrowing.  Discussed icing regimen and home exercise.  Discussed that this can take weeks to work appropriately.  Patient was able to walk out though with decreased pain immediately.  Patient will follow-up with me again in 4 weeks as long as the coronavirus seems to have dissipated somewhat otherwise will follow-up as needed if doing better too.  Spent  25 minutes with patient face-to-face and had greater than 50% of counseling including as described above in assessment and plan.  Update 03/03/2019 Amy Escobar is a 73 y.o. female coming in with complaint of bilateral knee pain. Patient states her knees are painful and she would like injections.  Has noticed pain is starting to give her more difficulty with daily activities.  Unable to go up and down stairs.  Patient is no longer taking care of her friend is much and thinks that that has been somewhat helpful.    Past Medical History:  Diagnosis Date  . Alkaline phosphatase deficiency    w/u Ne  . Allergic rhinitis   . Anxiety   . Arthritis   . DM2 (diabetes mellitus, type 2) ([place])   . GERD (gastroesophageal reflux disease)   . Gout   . Hemorrhoids   . Hyperlipidemia   . Hypertension   . Mild pulmonary hypertension ([place])   . NSVT (nonsustained ventricular tachycardia) ([place])   . Osteoporosis   . PVC's (premature ventricular contractions)   . Thyroid nodule    small  . Uterine fibroid    Past Surgical History:  Procedure Laterality Date  . CATARACT EXTRACTION Bilateral 2016  . COLONOSCOPY  2007  . echocardiogram (other)  01/16/2002  . 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 stress cardiolite  02/12/2006  . TOENAIL EXCISION    . TUBAL LIGATION     Social History    Socioeconomic History  . Marital status: Widowed    Spouse name: Not on file  . Number of children: 2  . Years of education: Not on file  . Highest education level: Not on file  Occupational History  . Occupation: Surveyor, minerals: UNEMPLOYED  Social Needs  . Financial resource strain: Not on file  . Food insecurity    Worry: Not on file    Inability: Not on file  . Transportation needs    Medical: Not on file    Non-medical: Not on file  Tobacco Use  . Smoking status: Former Smoker    Packs/day: 0.25    Years: 5.00    Pack years: 1.25    Types: Cigarettes    Quit date: 06/29/1968    Years since quitting: 50.7  . Smokeless tobacco: Never Used  Substance and Sexual Activity  . Alcohol use: No    Alcohol/week: 0.0 standard drinks  . Drug use: No  . Sexual activity: Not on file  Lifestyle  . Physical activity    Days per week: Not on file    Minutes per session: Not on file  . Stress: Not on file  Relationships  . Social Herbalist on phone: Not on file    Gets together: Not on file    Attends religious service: Not on  file    Active member of club or organization: Not on file    Attends meetings of clubs or organizations: Not on file    Relationship status: Not on file  Other Topics Concern  . Not on file  Social History Narrative   Widowed 2010.    Allergies  Allergen Reactions  . Olmesartan Medoxomil     REACTION: headache   Family History  Problem Relation Age of Onset  . Heart disease Mother   . Heart disease Father   . Heart disease Maternal Grandfather   . Rectal cancer Maternal Grandfather   . Stomach cancer Maternal Grandmother   . Colon cancer Neg Hx   . Breast cancer Neg Hx     Current Outpatient Medications (Endocrine & Metabolic):  .  canagliflozin (INVOKANA) 100 MG TABS tablet, Take 1 tablet (100 mg total) by mouth daily before breakfast. .  metFORMIN (GLUCOPHAGE-XR) 500 MG 24 hr tablet, Take 1,000 mg by mouth 2 (two) times  daily as needed.  Current Outpatient Medications (Cardiovascular):  .  amLODipine (NORVASC) 2.5 MG tablet, TAKE ONE TABLET BY MOUTH DAILY .  rosuvastatin (CRESTOR) 5 MG tablet, TAKE ONE TABLET BY MOUTH DAILY .  valsartan (DIOVAN) 80 MG tablet, TAKE ONE TABLET BY MOUTH DAILY  Current Outpatient Medications (Respiratory):  .  benzonatate (TESSALON) 200 MG capsule, Take 1 capsule (200 mg total) by mouth 3 (three) times daily as needed for cough. .  Chlorphen-Pseudoephed-APAP (CORICIDIN D PO), Take by mouth as needed.  .  chlorpheniramine (CHLOR-TRIMETON) 4 MG tablet, Take 4 mg by mouth 2 (two) times daily as needed for allergies.  Current Outpatient Medications (Analgesics):  .  aspirin EC 81 MG tablet, Take 81 mg by mouth daily.   Current Outpatient Medications (Other):  .  Blood Glucose Monitoring Suppl (ONETOUCH VERIO) w/Device KIT, 1 Device by Does not apply route daily. Marland Kitchen  esomeprazole (NEXIUM) 40 MG capsule, Take 30- 60 min before your first and last meals of the day .  famotidine (PEPCID) 20 MG tablet, Take 1 tablet (20 mg total) by mouth 2 (two) times daily. .  fesoterodine (TOVIAZ) 4 MG TB24 tablet, Take 4 mg daily by mouth. Marland Kitchen  glucose blood (ONETOUCH VERIO) test strip, 1 each by Other route daily. Marland Kitchen  glucose blood test strip, 1 each by Other route daily. .  hydrocortisone 2.5 % lotion,  .  triamcinolone cream (KENALOG) 0.1 %, APPLY 1 APPLICATION TOPICALLY 4 (FOUR) TIMES DAILY AS NEEDED FOR RASH .  UNKNOWN TO PATIENT, Inject as directed every 6 (six) months. 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  Vitamin D, Ergocalciferol, (DRISDOL) 1.25 MG (50000 UT) CAPS capsule, TAKE ONE CAPSULE BY MOUTH ONCE WEEKLY    Past medical history, social, surgical and family history all reviewed in electronic medical record.  No pertanent information unless stated regarding to the chief complaint.   Review of Systems:  No headache, visual changes, nausea, vomiting, diarrhea,  constipation, dizziness, abdominal pain, skin rash, fevers, chills, night sweats, weight loss, swollen lymph nodes, body aches, joint swelling, chest pain, shortness of breath, mood changes.  Positive muscle aches  Objective  Blood pressure 126/74, pulse 91, height '5\' 4"'  (1.626 m), weight 168 lb (76.2 kg), SpO2 97 %.    General: No apparent distress alert and oriented x3 mood and affect normal, dressed appropriately.  HEENT: Pupils equal, extraocular movements intact  Respiratory: Patient's speak in full sentences and does not appear short of breath  Cardiovascular: No lower extremity edema, non tender, no erythema  Skin: Warm dry intact with no signs of infection or rash on extremities or on axial skeleton.  Abdomen: Soft nontender  Neuro: Cranial nerves II through XII are intact, neurovascularly intact in all extremities with 2+ DTRs and 2+ pulses.  Lymph: No lymphadenopathy of posterior or anterior cervical chain or axillae bilaterally.  Gait antalgic MSK:  tender with limited range of motion and good stability and symmetric strength and tone of shoulders, elbows, wrist, hip and ankles bilaterally. Knee: Bilateral valgus deformity noted.  Abnormal thigh to calf ratio.  Tender to palpation over medial and PF joint line.  ROM full in flexion and extension and lower leg rotation. instability with valgus force.  painful patellar compression. Patellar glide with moderate crepitus. Patellar and quadriceps tendons unremarkable. Hamstring and quadriceps strength is normal.   After informed written and verbal consent, patient was seated on exam table. Right knee was prepped with alcohol swab and utilizing anterolateral approach, patient's right knee space was injected with 4:1  marcaine 0.5%: Kenalog 20m/dL. Patient tolerated the procedure well without immediate complications.  After informed written and verbal consent, patient was seated on exam table. Left knee was prepped with alcohol swab  and utilizing anterolateral approach, patient's left knee space was injected with 4:1  marcaine 0.5%: Kenalog 464mdL. Patient tolerated the procedure well without immediate complications.   Impression and Recommendations:     This case required medical decision making of moderate complexity. The above documentation has been reviewed and is accurate and complete ZaLyndal PulleyDO       Note: This dictation was prepared with Dragon dictation along with smaller phrase technology. Any transcriptional errors that result from this process are unintentional.

## 2019-03-07 ENCOUNTER — Other Ambulatory Visit (HOSPITAL_COMMUNITY)
Admission: RE | Admit: 2019-03-07 | Discharge: 2019-03-07 | Disposition: A | Discharge status: Home / Self Care | Payer: Medicare Other | Source: Ambulatory Visit | Attending: Cardiology | Admitting: Cardiology

## 2019-03-07 ENCOUNTER — Telehealth (HOSPITAL_COMMUNITY): Payer: Self-pay | Admitting: *Deleted

## 2019-03-07 DIAGNOSIS — Z01812 Encounter for preprocedural laboratory examination: Secondary | ICD-10-CM | POA: Insufficient documentation

## 2019-03-07 DIAGNOSIS — Z20828 Contact with and (suspected) exposure to other viral communicable diseases: Secondary | ICD-10-CM | POA: Diagnosis not present

## 2019-03-07 LAB — SARS CORONAVIRUS 2 (TAT 6-24 HRS): SARS Coronavirus 2: NEGATIVE

## 2019-03-07 NOTE — Telephone Encounter (Signed)
Patient given detailed instructions per Myocardial Perfusion Study Information Sheet for the test on 03/10/19 at 1015. Patient notified to arrive 15 minutes early and that it is imperative to arrive on time for appointment to keep from having the test rescheduled.  If you need to cancel or reschedule your appointment, please call the office within 24 hours of your appointment. . Patient verbalized understanding.Amy Escobar

## 2019-03-09 ENCOUNTER — Other Ambulatory Visit (HOSPITAL_COMMUNITY): Payer: Medicare Other

## 2019-03-09 ENCOUNTER — Inpatient Hospital Stay (HOSPITAL_COMMUNITY): Admission: RE | Admit: 2019-03-09 | Payer: Medicare Other | Source: Ambulatory Visit

## 2019-03-10 ENCOUNTER — Other Ambulatory Visit: Payer: Self-pay

## 2019-03-10 ENCOUNTER — Ambulatory Visit (HOSPITAL_COMMUNITY): Payer: Medicare Other | Attending: Cardiology

## 2019-03-10 ENCOUNTER — Ambulatory Visit (HOSPITAL_BASED_OUTPATIENT_CLINIC_OR_DEPARTMENT_OTHER): Payer: Medicare Other

## 2019-03-10 DIAGNOSIS — I2510 Atherosclerotic heart disease of native coronary artery without angina pectoris: Secondary | ICD-10-CM | POA: Diagnosis not present

## 2019-03-10 DIAGNOSIS — R072 Precordial pain: Secondary | ICD-10-CM | POA: Diagnosis not present

## 2019-03-10 DIAGNOSIS — I272 Pulmonary hypertension, unspecified: Secondary | ICD-10-CM | POA: Insufficient documentation

## 2019-03-10 DIAGNOSIS — I10 Essential (primary) hypertension: Secondary | ICD-10-CM

## 2019-03-10 LAB — MYOCARDIAL PERFUSION IMAGING
LV dias vol: 66 mL (ref 46–106)
LV sys vol: 27 mL
Peak HR: 121 {beats}/min
Rest HR: 86 {beats}/min
SDS: 5
SRS: 1
SSS: 6
TID: 0.97

## 2019-03-10 MED ORDER — REGADENOSON 0.4 MG/5ML IV SOLN
0.4000 mg | Freq: Once | INTRAVENOUS | Status: AC
  Administered 2019-03-10: 0.4 mg via INTRAVENOUS

## 2019-03-10 MED ORDER — TECHNETIUM TC 99M TETROFOSMIN IV KIT
10.4000 | PACK | Freq: Once | INTRAVENOUS | Status: AC | PRN
  Administered 2019-03-10: 10.4 via INTRAVENOUS
  Filled 2019-03-10: qty 11

## 2019-03-10 MED ORDER — TECHNETIUM TC 99M TETROFOSMIN IV KIT
31.8000 | PACK | Freq: Once | INTRAVENOUS | Status: AC | PRN
  Administered 2019-03-10: 31.8 via INTRAVENOUS
  Filled 2019-03-10: qty 32

## 2019-03-26 ENCOUNTER — Other Ambulatory Visit: Payer: Self-pay | Admitting: Cardiology

## 2019-03-26 ENCOUNTER — Other Ambulatory Visit: Payer: Self-pay | Admitting: Family Medicine

## 2019-03-31 ENCOUNTER — Other Ambulatory Visit: Payer: Self-pay

## 2019-03-31 ENCOUNTER — Ambulatory Visit (INDEPENDENT_AMBULATORY_CARE_PROVIDER_SITE_OTHER): Payer: Medicare Other | Admitting: Family

## 2019-03-31 ENCOUNTER — Encounter: Payer: Self-pay | Admitting: Family

## 2019-03-31 VITALS — BP 100/54 | HR 108 | Temp 98.3°F | Ht 64.5 in | Wt 161.2 lb

## 2019-03-31 DIAGNOSIS — B379 Candidiasis, unspecified: Secondary | ICD-10-CM

## 2019-03-31 DIAGNOSIS — N390 Urinary tract infection, site not specified: Secondary | ICD-10-CM

## 2019-03-31 MED ORDER — NITROFURANTOIN MONOHYD MACRO 100 MG PO CAPS: 100.0000 mg | ORAL_CAPSULE | Freq: Two times a day (BID) | ORAL | 0 refills | Status: DC

## 2019-03-31 MED ORDER — FLUCONAZOLE 150 MG PO TABS: 150.0000 mg | ORAL_TABLET | Freq: Once | ORAL | 0 refills | Status: AC

## 2019-03-31 NOTE — Progress Notes (Signed)
Amy Escobar is a 73 y.o. female with the following history as recorded in Epic[employer]:  Patient Active Problem List   Diagnosis Date Noted  . Upper airway cough syndrome 08/22/2018  . Greater trochanteric bursitis of both hips 06/15/2018  . Trigger point of shoulder region, left 02/07/2018  . Hypertension   . Mild pulmonary hypertension ([place])   . NSVT (nonsustained ventricular tachycardia) ([place])   . PVC's (premature ventricular contractions)   . URI (upper respiratory infection) 08/09/2016  . Neck pain 02/10/2016  . Urinary incontinence 02/10/2016  . Degenerative arthritis of knee, bilateral 07/17/2015  . Knee MCL sprain 06/07/2015  . Discomfort in chest 02/15/2015  . Chest pain 02/15/2015  . DM type 2, controlled, with complication ([place])   . Wellness examination 08/06/2014  . Acute meniscal tear of knee 02/13/2014  . Primary localized osteoarthrosis, lower leg 02/13/2014  . Gastrocnemius tear 12/25/2013  . UTI (urinary tract infection) 12/20/2013  . Right knee pain 12/20/2013  . Pulmonary hypertension ([place]) 10/06/2013  . DOE (dyspnea on exertion) 08/04/2013  . Dysphagia, unspecified(787.20) 08/10/2012  . Hip pain, left 02/02/2012  . Painful respiration 05/25/2011  . Encounter for long-term (current) use of other medications 01/30/2011  . PELVIC PAIN, LEFT 07/30/2010  . TOBACCO USE, QUIT 07/07/2010  . FATIGUE 12/20/2009  . Headache 12/20/2009  . Low back pain 12/26/2008  . Disorder of liver 04/13/2008  . FOOT PAIN, BILATERAL 04/13/2008  . FIBROIDS, UTERUS 11/24/2007  . THYROID NODULE, LEFT 11/24/2007  . HYPERCHOLESTEROLEMIA 11/24/2007  . CARPAL TUNNEL SYNDROME, BILATERAL 11/24/2007  . ALKALINE PHOSPHATASE, ELEVATED 11/24/2007  . Gout 08/10/2007  . ANXIETY 08/10/2007  . Essential hypertension 08/10/2007  . Cough 08/01/2007  . Allergic rhinitis 01/14/2007  . GERD 01/14/2007  . Osteoporosis 01/14/2007    Current Outpatient Medications  Medication Sig Dispense  Refill  . amLODipine (NORVASC) 2.5 MG tablet TAKE ONE TABLET BY MOUTH DAILY 90 tablet 0  . aspirin EC 81 MG tablet Take 81 mg by mouth daily.    . benzonatate (TESSALON) 200 MG capsule Take 1 capsule (200 mg total) by mouth 3 (three) times daily as needed for cough. 45 capsule 2  . Blood Glucose Monitoring Suppl (ONETOUCH VERIO) w/Device KIT 1 Device by Does not apply route daily. 1 kit 0  . canagliflozin (INVOKANA) 100 MG TABS tablet Take 1 tablet (100 mg total) by mouth daily before breakfast. 30 tablet 11  . Chlorphen-Pseudoephed-APAP (CORICIDIN D PO) Take by mouth as needed.     . chlorpheniramine (CHLOR-TRIMETON) 4 MG tablet Take 4 mg by mouth 2 (two) times daily as needed for allergies.    Marland Kitchen esomeprazole (NEXIUM) 40 MG capsule Take 30- 60 min before your first and last meals of the day 180 capsule 2  . famotidine (PEPCID) 20 MG tablet Take 1 tablet (20 mg total) by mouth 2 (two) times daily. 20 tablet 0  . fesoterodine (TOVIAZ) 4 MG TB24 tablet Take 4 mg daily by mouth.    . fluconazole (DIFLUCAN) 150 MG tablet Take 1 tablet (150 mg total) by mouth once for 1 dose. Repeat after 72 hours 2 tablet 0  . glucose blood (ONETOUCH VERIO) test strip 1 each by Other route daily. 100 each 12  . glucose blood test strip 1 each by Other route daily. 100 each 12  . hydrocortisone 2.5 % lotion     . metFORMIN (GLUCOPHAGE-XR) 500 MG 24 hr tablet Take 1,000 mg by mouth 2 (two) times daily as needed.    Marland Kitchen  nitrofurantoin, macrocrystal-monohydrate, (MACROBID) 100 MG capsule Take 1 capsule (100 mg total) by mouth 2 (two) times daily. 14 capsule 0  . rosuvastatin (CRESTOR) 5 MG tablet TAKE ONE TABLET BY MOUTH DAILY 90 tablet 3  . triamcinolone cream (KENALOG) 0.1 % APPLY 1 APPLICATION TOPICALLY 4 (FOUR) TIMES DAILY AS NEEDED FOR RASH 45 g 1  . UNKNOWN TO PATIENT Inject as directed every 6 (six) months. 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 valsartan (DIOVAN) 80 MG tablet TAKE ONE TABLET BY MOUTH  DAILY 90 tablet 0  . Vitamin D, Ergocalciferol, (DRISDOL) 1.25 MG (50000 UT) CAPS capsule TAKE ONE CAPSULE BY MOUTH ONCE WEEKLY 12 capsule 0   No current facility-administered medications for this visit.     Allergies: Olmesartan medoxomil  Past Medical History:  Diagnosis Date  . Alkaline phosphatase deficiency    w/u Ne  . Allergic rhinitis   . Anxiety   . Arthritis   . DM2 (diabetes mellitus, type 2) ([place])   . GERD (gastroesophageal reflux disease)   . Gout   . Hemorrhoids   . Hyperlipidemia   . Hypertension   . Mild pulmonary hypertension ([place])   . NSVT (nonsustained ventricular tachycardia) ([place])   . Osteoporosis   . PVC's (premature ventricular contractions)   . Thyroid nodule    small  . Uterine fibroid     Past Surgical History:  Procedure Laterality Date  . CATARACT EXTRACTION Bilateral 2016  . COLONOSCOPY  2007  . echocardiogram (other)  01/16/2002  . 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 stress cardiolite  02/12/2006  . TOENAIL EXCISION    . TUBAL LIGATION      Family History  Problem Relation Age of Onset  . Heart disease Mother   . Heart disease Father   . Heart disease Maternal Grandfather   . Rectal cancer Maternal Grandfather   . Stomach cancer Maternal Grandmother   . Colon cancer Neg Hx   . Breast cancer Neg Hx     Social History   Tobacco Use  . Smoking status: Former Smoker    Packs/day: 0.25    Years: 5.00    Pack years: 1.25    Types: Cigarettes    Quit date: 06/29/1968    Years since quitting: 50.7  . Smokeless tobacco: Never Used  Substance Use Topics  . Alcohol use: No    Alcohol/week: 0.0 standard drinks    Subjective:  Patient presents with concerns for possible UTI and/or yeast infection. History of Type 2 Diabetes- does take Invokana-started within 30 days; last Hgba1c was 6.5 ( June 2020); Burning with urination x 1 week; no blood in the urine; no fever; not prone to UTIs;  Vaginal itching x 2-3 days; no vaginal  discharge noted;  Unable to do urine sample today;     Objective:  Vitals:   03/31/19 1003  BP: (!) 100/54  Pulse: (!) 108  Temp: 98.3 F (36.8 C)  TempSrc: Oral  SpO2: 96%  Weight: 161 lb 4 oz (73.1 kg)  Height: 5' 4.5" (1.638 m)    General: Well developed, well nourished, in no acute distress  Skin : Warm and dry.  Head: Normocephalic and atraumatic  Lungs: Respirations unlabored; clear to auscultation bilaterally without wheeze, rales, rhonchi  CVS exam: normal rate and regular rhythm.  Neurologic: Alert and oriented; speech intact; face symmetrical; moves all extremities well; CNII-XII intact without focal deficit   Assessment:  1. Urinary  tract infection without hematuria, site unspecified   2. Yeast infection     Plan:  ? Related to Invokana; unable to provide urine sample at time of OV; Rx for Macrobid 100 mg bid x 7 days, Diflucan 150 mg 1 po qd- repeat after 72 hours; She needs to let her endocrinologist know about symptoms- may need to consider medication change is symptoms recur; She is given urine cup to take home- if symptoms persist, needs to bring sample to office.    No follow-ups on file.  No orders of the defined types were placed in this encounter.   Requested Prescriptions   Signed Prescriptions Disp Refills  . fluconazole (DIFLUCAN) 150 MG tablet 2 tablet 0    Sig: Take 1 tablet (150 mg total) by mouth once for 1 dose. Repeat after 72 hours  . nitrofurantoin, macrocrystal-monohydrate, (MACROBID) 100 MG capsule 14 capsule 0    Sig: Take 1 capsule (100 mg total) by mouth 2 (two) times daily.

## 2019-03-31 NOTE — Patient Instructions (Signed)
Please let Dr. Loanne Drilling know that you were here with a UTI/ yeast infection. We will need to watch the frequency of these infections.

## 2019-04-07 ENCOUNTER — Other Ambulatory Visit: Payer: Self-pay | Admitting: Cardiology

## 2019-04-07 DIAGNOSIS — I2510 Atherosclerotic heart disease of native coronary artery without angina pectoris: Secondary | ICD-10-CM

## 2019-04-07 DIAGNOSIS — R072 Precordial pain: Secondary | ICD-10-CM

## 2019-04-07 DIAGNOSIS — I10 Essential (primary) hypertension: Secondary | ICD-10-CM

## 2019-04-07 DIAGNOSIS — E782 Mixed hyperlipidemia: Secondary | ICD-10-CM

## 2019-04-13 ENCOUNTER — Encounter: Payer: Self-pay | Admitting: Internal Medicine

## 2019-04-13 ENCOUNTER — Ambulatory Visit (INDEPENDENT_AMBULATORY_CARE_PROVIDER_SITE_OTHER): Payer: Medicare Other | Admitting: Internal Medicine

## 2019-04-13 ENCOUNTER — Telehealth: Payer: Self-pay

## 2019-04-13 DIAGNOSIS — Z20822 Contact with and (suspected) exposure to covid-19: Secondary | ICD-10-CM | POA: Insufficient documentation

## 2019-04-13 DIAGNOSIS — Z20828 Contact with and (suspected) exposure to other viral communicable diseases: Secondary | ICD-10-CM | POA: Diagnosis not present

## 2019-04-13 NOTE — Assessment & Plan Note (Signed)
Ordered covid-19 testing and advised to quarantine until results return.

## 2019-04-13 NOTE — Telephone Encounter (Signed)
Spoke with patient today. She is scheduled for VV with Dr. Sharlet Salina this afternoon.

## 2019-04-13 NOTE — Progress Notes (Signed)
Virtual Visit via Video Note  I connected with Amy Escobar on 04/13/19 at  3:20 PM EDT by a video enabled telemedicine application and verified that I am speaking with the correct person using two identifiers.  The patient and the provider were at separate locations throughout the entire encounter.   I discussed the limitations of evaluation and management by telemedicine and the availability of in person appointments. The patient expressed understanding and agreed to proceed. The patient and the provider were the only parties present for the visit unless noted in HPI below.  History of Present Illness: The patient is a 73 y.o. female with visit for fevers and chills and staggering. Started Tuesday. Has fatigue still and some body aches. Is also coughing but denies SOB. Denies nausea or vomiting or diarrhea. Overall it is not improving. Has tried nothing for it.  Observations/Objective: Appearance: normal, breathing appears normal, casual grooming, abdomen does not appear distended, throat with drainage, memory A and O times 3  Assessment and Plan: See problem oriented charting  Follow Up Instructions: covid-19 test ordered  Visit time 25 minutes: greater than 50% of that time was spent in face to face counseling and coordination of care with the patient: counseled about covid-19 and need to quarantine until results return  I discussed the assessment and treatment plan with the patient. The patient was provided an opportunity to ask questions and all were answered. The patient agreed with the plan and demonstrated an understanding of the instructions.   The patient was advised to call back or seek an in-person evaluation if the symptoms worsen or if the condition fails to improve as anticipated.  Hoyt Koch, MD

## 2019-04-14 ENCOUNTER — Other Ambulatory Visit: Payer: Self-pay

## 2019-04-14 DIAGNOSIS — Z20822 Contact with and (suspected) exposure to covid-19: Secondary | ICD-10-CM

## 2019-04-15 LAB — NOVEL CORONAVIRUS, NAA: SARS-CoV-2, NAA: NOT DETECTED

## 2019-04-19 ENCOUNTER — Other Ambulatory Visit: Payer: Self-pay

## 2019-04-19 ENCOUNTER — Encounter: Payer: Self-pay | Admitting: Internal Medicine

## 2019-04-19 ENCOUNTER — Ambulatory Visit (INDEPENDENT_AMBULATORY_CARE_PROVIDER_SITE_OTHER): Payer: Medicare Other | Admitting: Internal Medicine

## 2019-04-19 DIAGNOSIS — R05 Cough: Secondary | ICD-10-CM

## 2019-04-19 DIAGNOSIS — R0609 Other forms of dyspnea: Secondary | ICD-10-CM

## 2019-04-19 DIAGNOSIS — I739 Peripheral vascular disease, unspecified: Secondary | ICD-10-CM | POA: Diagnosis not present

## 2019-04-19 DIAGNOSIS — R0600 Dyspnea, unspecified: Secondary | ICD-10-CM

## 2019-04-19 DIAGNOSIS — M4692 Unspecified inflammatory spondylopathy, cervical region: Secondary | ICD-10-CM

## 2019-04-19 DIAGNOSIS — R058 Other specified cough: Secondary | ICD-10-CM

## 2019-04-19 MED ORDER — GABAPENTIN 600 MG PO TABS: ORAL_TABLET | ORAL | Status: DC

## 2019-04-19 MED ORDER — CHLORPHENIRAMINE MALEATE 4 MG PO TABS: 4.0000 mg | ORAL_TABLET | ORAL | Status: DC | PRN

## 2019-04-19 MED ORDER — MONTELUKAST SODIUM 10 MG PO TABS: 10.0000 mg | ORAL_TABLET | Freq: Every day | ORAL | 11 refills | Status: DC

## 2019-04-19 MED ORDER — BENZONATATE 200 MG PO CAPS: 200.0000 mg | ORAL_CAPSULE | Freq: Three times a day (TID) | ORAL | 2 refills | Status: DC | PRN

## 2019-04-19 NOTE — Assessment & Plan Note (Addendum)
Onset was around 2013  -  Neg egd by [facility] 09/06/12  - Allergy profile 08/22/2018 >  Eos 0.2 /  IgE  2095 RAST pos for everything x cats  - 08/22/2018  gabapentin changed from 600 at hs to 300 mg each am and 600 at bedtime   - 04/19/2019 cough flared off gabapentin > resume plus add singulair trial    May need formal allergy eval as well will address on return with all meds in hand using a trust but verify approach to confirm accurate Medication  Reconciliation The principal here is that until we are certain that the  patients are doing what we've asked, it makes no sense to ask them to do more.    Each maintenance medication was reviewed in detail including most importantly the difference between maintenance and as needed and under what circumstances the prns are to be used.  Please see AVS for specific  Instructions which are unique to this visit and I personally typed out  which were reviewed in detail over the phone with the patient and a copy provided via MyChart

## 2019-04-19 NOTE — Patient Instructions (Addendum)
For drainage / throat tickle try take CHLORPHENIRAMINE  4 mg  (Chlortab 4mg   at [employer] should be easiest to find in the green box)  take one every 4 hours as needed - available over the counter and take 2 tablets about one hour before bed   Renewed Tessalon to take as needed for   Restart gabapentin  300 mg in am  600 mg after supper   Start singulair 10 mg each pm    Keep your previous appt to see me but  Please bring  all medications /inhalers/ solutions in hand so we can verify exactly what you are taking. This includes all medications from all doctors and over the [place] separate them into two bags:  the ones you take automatically, no matter what, vs the ones you take just when you feel you need them "BAG #2 is UP TO YOU"  - this will really help [place] help you take your medications more effectively.

## 2019-04-19 NOTE — Assessment & Plan Note (Signed)
-   spirometry 08/04/13 truncated in a non-physiologic pattern  - 08/08/2013  Walked RA x 3 laps @ 185 ft each stopped due to end of study no desat  - PFT's 09/12/2013 wnl   Neg cards eval, just completed neg cards eval so address cough first then return for walking study on return  Advised avoid macrodantin in this setting

## 2019-04-19 NOTE — Progress Notes (Signed)
Amy Escobar, female    DOB: 02-May-1946    MRN: [id number]   Brief patient profile:  63 yobf quit smoking for good 1980s with h/o GERD going back to at least 2010 with neg egd by [facility] 09/06/12   And some tendency for colds to settle in sinuses and tendency for pnds req daily zyrtec x 2018 - was eval in pulmonary clinic with nl pfts/unexplained doe and cough attributed to uacs and started on gabapentin but did not return p 09/12/13 with  recs  As follows:  Increase neurontin to 300 mg  three times a day to see if helps your cough  If still have drainage a recommend a trial of zyrtec 10 mg daily and possible an allergy evaluation   Improved p this eval and req gabapentin up to 600 tid for dm neuropathy but adjusted this down on her own due to excess dayime drowsiness at only taking 600 mg at when self referred back to pulmonary clinic 08/22/2018 due to cough since mid Jan 2020 s/p 2 zpaks     History of Present Illness  08/22/2018  Pulmonary/ 1st office eval/Delle Andrzejewski  Chief Complaint  Patient presents with  . Pulmonary Consult    Self referral. Pt c/o cough x 5 wks- currently prod with clear sputum.   Dyspnea:  Does flat and slow = MMRC2 = can't walk a nl pace on a flat grade s sob but does fine slow and flat also limited by knees  Cough: urge to clear throat x 2013 / pred did not help / daytime aggravated by voice use  Sleep: on one pillow / bed is flat sometimes wakes her up / rec Increase you nexium to 40 mg Take 30- 60 min before your first and last meals of the day  Increase your gabapentin to 300 mg each am and continue 600 mg at bedtime  For cough as needed >  Tessalon 200 mg up to every 6 hours and the goal is no coughing at all  GERD  Diet       11/08/2018  f/u ov/Detrich Rakestraw re: uacs improved vs prior  Chief Complaint  Patient presents with  . Follow-up    Cough is about the same. No new co's today.   Dyspnea:  MMRC1 = can walk nl pace, flat grade, can't hurry or go uphills or  steps s sob  Cough: improved but needing tessilon 200 up to twice daily  Sleeping: on wedge wake up with dry cough about  twice weekly  SABA use: none  02: none  rec For drainage / throat tickle try take CHLORPHENIRAMINE  4 mg  (Chlortab 4mg   at [employer] should be easiest to find in the green box)  take one every 4 hours as needed  Ok to take  zyrtec otc daily  but you may find you don't need it anymore    - try and see  Please schedule a follow up visit in 6 months but call sooner if needed  - consider adding singulair if hasn't tried it then allergy eval next step     Virtual Visit via Telephone Note 04/19/2019   I connected with Amy Escobar on 04/19/19 at 0945  AM EDT by telephone and verified that I am speaking with the correct person using two identifiers.   I discussed the limitations, risks, security and privacy concerns of performing an evaluation and management service by telephone and the availability of in person appointments. I  also discussed with the patient that there may be a patient responsible charge related to this service. The patient expressed understanding and agreed to proceed.   History of Present Illness: cough much worse, off gabapentin now  Dyspnea:  Worse x 3-4 weeks with neg cards w/u/ just finished one week of macrodantin does not plan more Cough: worse p supper then hs clear mucus only  Sleeping: wedge pillow waking up most nocts  SABA use: none  02: none    No obvious day to day or daytime variability or assoc   purulent sputum or mucus plugs or hemoptysis or cp or chest tightness, subjective wheeze or overt sinus or hb symptoms.    Also denies any obvious fluctuation of symptoms with weather or environmental changes or other aggravating or alleviating factors except as outlined above.   Meds reviewed/ med reconciliation completed   Note easily confused with names of meds          Observations/Objective: One coughing fit during  interview, sounded upper airway    Assessment and Plan: See problem list for active a/p's   Follow Up Instructions: See avs for instructions unique to this ov which includes revised/ updated med list     I discussed the assessment and treatment plan with the patient. The patient was provided an opportunity to ask questions and all were answered. The patient agreed with the plan and demonstrated an understanding of the instructions.   The patient was advised to call back or seek an in-person evaluation if the symptoms worsen or if the condition fails to improve as anticipated.  I provided 25 minutes of non-face-to-face time during this encounter.   Christinia Gully, MD

## 2019-04-24 ENCOUNTER — Other Ambulatory Visit: Payer: Self-pay

## 2019-04-24 ENCOUNTER — Other Ambulatory Visit: Payer: Self-pay | Admitting: Internal Medicine

## 2019-04-24 ENCOUNTER — Telehealth: Payer: Self-pay

## 2019-04-24 DIAGNOSIS — R05 Cough: Secondary | ICD-10-CM

## 2019-04-24 DIAGNOSIS — R058 Other specified cough: Secondary | ICD-10-CM

## 2019-04-24 NOTE — Telephone Encounter (Signed)
Patient called in stating she may have gotten a yeast infection from her medication canagliflozin (INVOKANA) 100 MG TABS tablet.. patient is wanting something sent to pharmacy for yeast ASAP.   Pharmacy: [facility]   Please call and advise

## 2019-04-24 NOTE — Telephone Encounter (Signed)
After speaking with Olen Cordial, he states that he spoke directly to this pt. This is not "hypercritical". Pt has reported a yeast infection and is wanting to change medications. Please advise

## 2019-04-24 NOTE — Telephone Encounter (Signed)
Per Dr. Cordelia Pen request, please call pt to schedule an appt. Will address her concerns during appt

## 2019-04-24 NOTE — Telephone Encounter (Signed)
Ov is due.  Let's address then 

## 2019-04-24 NOTE — Telephone Encounter (Signed)
Patient states " her situation is hyper-critical and that she is not going to take the Invokana any longer. She states that she will [place] another medication in its place"  Per patient she feels that her current situation is an emergency.

## 2019-04-24 NOTE — Telephone Encounter (Signed)
Patient is scheduled for appointment on 04/25/19 at 3:30 pm

## 2019-04-25 ENCOUNTER — Encounter: Payer: Self-pay | Admitting: Endocrinology

## 2019-04-25 ENCOUNTER — Ambulatory Visit (INDEPENDENT_AMBULATORY_CARE_PROVIDER_SITE_OTHER): Payer: Medicare Other | Admitting: Endocrinology

## 2019-04-25 VITALS — BP 100/60 | HR 94 | Ht 64.5 in | Wt 157.0 lb

## 2019-04-25 DIAGNOSIS — N760 Acute vaginitis: Secondary | ICD-10-CM | POA: Diagnosis not present

## 2019-04-25 DIAGNOSIS — E118 Type 2 diabetes mellitus with unspecified complications: Secondary | ICD-10-CM

## 2019-04-25 DIAGNOSIS — E119 Type 2 diabetes mellitus without complications: Secondary | ICD-10-CM | POA: Diagnosis not present

## 2019-04-25 DIAGNOSIS — I10 Essential (primary) hypertension: Secondary | ICD-10-CM

## 2019-04-25 LAB — POCT GLYCOSYLATED HEMOGLOBIN (HGB A1C): Hemoglobin A1C: 6.6 % — AB (ref 4.0–5.6)

## 2019-04-25 MED ORDER — CANAGLIFLOZIN 100 MG PO TABS: 100.0000 mg | ORAL_TABLET | ORAL | 5 refills | Status: DC

## 2019-04-25 MED ORDER — METFORMIN HCL ER 500 MG PO TB24: 1000.0000 mg | ORAL_TABLET | Freq: Two times a day (BID) | ORAL | 3 refills | Status: DC

## 2019-04-25 MED ORDER — FLUCONAZOLE 150 MG PO TABS: 150.0000 mg | ORAL_TABLET | Freq: Every day | ORAL | 3 refills | Status: DC

## 2019-04-25 NOTE — Patient Instructions (Addendum)
Your blood pressure is a little low today.  Please see your primary care provider soon, to have it rechecked. Please reduce the Invokana to 1 pill, every other day.   Please continue the same metformin.  I have sent a prescription to your pharmacy, for the yeast infection.   Please come back for a follow-up appointment in 3-4 months.

## 2019-04-25 NOTE — Progress Notes (Signed)
Subjective:    Patient ID: Amy Escobar, female    DOB: 09/02/1945, 73 y.o.   MRN: [id number]  HPI Pt returns for f/u of diabetes mellitus: DM type: 2 Dx'ed: 0000000 Complications: CAD Therapy: 2 oral meds GDM: never.   DKA: never.   Severe hypoglycemia: never.   Pancreatitis: never.  Other: she has never been on insulin; she gets occasional steroid injections into the knees.   Interval history: pt states she takes as rx'ed. No recent steroids.  She says invokana is causing vaginal burning.  she brings a record of her cbg's which I have reviewed today.  cbg varies from 93-180.   Past Medical History:  Diagnosis Date  . Alkaline phosphatase deficiency    w/u Ne  . Allergic rhinitis   . Anxiety   . Arthritis   . DM2 (diabetes mellitus, type 2) ([place])   . GERD (gastroesophageal reflux disease)   . Gout   . Hemorrhoids   . Hyperlipidemia   . Hypertension   . Mild pulmonary hypertension ([place])   . NSVT (nonsustained ventricular tachycardia) ([place])   . Osteoporosis   . PVC's (premature ventricular contractions)   . Thyroid nodule    small  . Uterine fibroid     Past Surgical History:  Procedure Laterality Date  . CATARACT EXTRACTION Bilateral 2016  . COLONOSCOPY  2007  . echocardiogram (other)  01/16/2002  . 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 stress cardiolite  02/12/2006  . TOENAIL EXCISION    . TUBAL LIGATION      Social History   Socioeconomic History  . Marital status: Widowed    Spouse name: Not on file  . Number of children: 2  . Years of education: Not on file  . Highest education level: Not on file  Occupational History  . Occupation: Surveyor, minerals: UNEMPLOYED  Social Needs  . Financial resource strain: Not on file  . Food insecurity    Worry: Not on file    Inability: Not on file  . Transportation needs    Medical: Not on file    Non-medical: Not on file  Tobacco Use  . Smoking status: Former Smoker    Packs/day: 0.25    Years:  5.00    Pack years: 1.25    Types: Cigarettes    Quit date: 06/29/1968    Years since quitting: 50.8  . Smokeless tobacco: Never Used  Substance and Sexual Activity  . Alcohol use: No    Alcohol/week: 0.0 standard drinks  . Drug use: No  . Sexual activity: Not on file  Lifestyle  . Physical activity    Days per week: Not on file    Minutes per session: Not on file  . Stress: Not on file  Relationships  . Social Herbalist on phone: Not on file    Gets together: Not on file    Attends religious service: Not on file    Active member of club or organization: Not on file    Attends meetings of clubs or organizations: Not on file    Relationship status: Not on file  . Intimate partner violence    Fear of current or ex partner: Not on file    Emotionally abused: Not on file    Physically abused: Not on file    Forced sexual activity: Not on file  Other Topics Concern  . Not on file  Social History Narrative  Widowed 2010.     Current Outpatient Medications on File Prior to Visit  Medication Sig Dispense Refill  . amLODipine (NORVASC) 2.5 MG tablet TAKE ONE TABLET BY MOUTH DAILY 90 tablet 0  . aspirin EC 81 MG tablet Take 81 mg by mouth daily.    . benzonatate (TESSALON) 200 MG capsule Take 1 capsule (200 mg total) by mouth 3 (three) times daily as needed for cough. 45 capsule 2  . Chlorphen-Pseudoephed-APAP (CORICIDIN D PO) Take by mouth as needed.     . chlorpheniramine (CHLOR-TRIMETON) 4 MG tablet Take 1 tablet (4 mg total) by mouth every 4 (four) hours as needed for allergies.    Marland Kitchen esomeprazole (NEXIUM) 40 MG capsule TAKE ONE CAPSULE BY MOUTH 30 TO 60 MINUTES BEFORE YOUR FIRST AND LAST MEALS OF THE DAY 180 capsule 1  . fesoterodine (TOVIAZ) 4 MG TB24 tablet Take 4 mg daily by mouth.    . gabapentin (NEURONTIN) 600 MG tablet One half in am and one in pm    . glucose blood (ONETOUCH VERIO) test strip 1 each by Other route daily. 100 each 12  . hydrocortisone 2.5 %  lotion     . montelukast (SINGULAIR) 10 MG tablet Take 1 tablet (10 mg total) by mouth at bedtime. 30 tablet 11  . rosuvastatin (CRESTOR) 5 MG tablet TAKE ONE TABLET BY MOUTH DAILY 90 tablet 3  . triamcinolone cream (KENALOG) 0.1 % APPLY 1 APPLICATION TOPICALLY 4 (FOUR) TIMES DAILY AS NEEDED FOR RASH 45 g 1  . UNKNOWN TO PATIENT Inject as directed every 6 (six) months. 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 valsartan (DIOVAN) 80 MG tablet TAKE ONE TABLET BY MOUTH DAILY 90 tablet 3  . Vitamin D, Ergocalciferol, (DRISDOL) 1.25 MG (50000 UT) CAPS capsule TAKE ONE CAPSULE BY MOUTH ONCE WEEKLY 12 capsule 0   No current facility-administered medications on file prior to visit.     Allergies  Allergen Reactions  . Olmesartan Medoxomil     REACTION: headache    Family History  Problem Relation Age of Onset  . Heart disease Mother   . Heart disease Father   . Heart disease Maternal Grandfather   . Rectal cancer Maternal Grandfather   . Stomach cancer Maternal Grandmother   . Colon cancer Neg Hx   . Breast cancer Neg Hx     BP 100/60 (BP Location: Left Arm, Patient Position: Sitting, Cuff Size: Normal)   Pulse 94   Ht 5' 4.5" (1.638 m)   Wt 157 lb (71.2 kg)   SpO2 91%   BMI 26.53 kg/m   Review of Systems Denies nausea.  She has fatigue.      Objective:   Physical Exam VITAL SIGNS:  See vs page GENERAL: no distress Pulses: dorsalis pedis intact bilat.   MSK: no deformity of the feet CV: no leg edema Skin:  no ulcer on the feet.  normal color and temp on the feet. Neuro: sensation is intact to touch on the feet Ext: both great toenails are absent  Lab Results  Component Value Date   HGBA1C 6.6 (A) 04/25/2019    Lab Results  Component Value Date   CREATININE 0.76 11/30/2018   BUN 9 11/30/2018   NA 132 (L) 11/30/2018   K 3.5 11/30/2018   CL 95 (L) 11/30/2018   CO2 29 11/30/2018   Lab Results  Component Value Date   TSH 1.56 11/30/2018  Assessment & Plan:  HTN: overcontrolled. Type 2 DM: well-controlled Vaginitis, new, prob due to invokana.   Patient Instructions  Your blood pressure is a little low today.  Please see your primary care provider soon, to have it rechecked. Please reduce the Invokana to 1 pill, every other day.   Please continue the same metformin.  I have sent a prescription to your pharmacy, for the yeast infection.   Please come back for a follow-up appointment in 3-4 months.

## 2019-05-03 DIAGNOSIS — R3914 Feeling of incomplete bladder emptying: Secondary | ICD-10-CM | POA: Diagnosis not present

## 2019-05-03 DIAGNOSIS — N130 Hydronephrosis with ureteropelvic junction obstruction: Secondary | ICD-10-CM | POA: Diagnosis not present

## 2019-05-08 ENCOUNTER — Ambulatory Visit (INDEPENDENT_AMBULATORY_CARE_PROVIDER_SITE_OTHER): Payer: Medicare Other | Admitting: Internal Medicine

## 2019-05-08 ENCOUNTER — Ambulatory Visit (INDEPENDENT_AMBULATORY_CARE_PROVIDER_SITE_OTHER): Payer: Medicare Other

## 2019-05-08 ENCOUNTER — Other Ambulatory Visit: Payer: Self-pay

## 2019-05-08 ENCOUNTER — Encounter: Payer: Self-pay | Admitting: Internal Medicine

## 2019-05-08 VITALS — BP 122/60 | HR 93 | Temp 97.3°F | Ht 64.5 in | Wt 154.8 lb

## 2019-05-08 DIAGNOSIS — R0609 Other forms of dyspnea: Secondary | ICD-10-CM

## 2019-05-08 DIAGNOSIS — R0600 Dyspnea, unspecified: Secondary | ICD-10-CM

## 2019-05-08 DIAGNOSIS — R05 Cough: Secondary | ICD-10-CM

## 2019-05-08 DIAGNOSIS — R058 Other specified cough: Secondary | ICD-10-CM

## 2019-05-08 MED ORDER — BENZONATATE 200 MG PO CAPS: 200.0000 mg | ORAL_CAPSULE | Freq: Three times a day (TID) | ORAL | 2 refills | Status: DC | PRN

## 2019-05-08 NOTE — Patient Instructions (Addendum)
Chlorpheniramine should be to take 2 bedtime   Continue singulair 10 mg each pm   During the day can take tessalon 200 mg every 6 hours if needed   We will be referring you to Dr Neldon Mc   Please remember to go to the  x-ray department  for your tests - we will call you with the results when they are available    Follow up here is as needed once you establish with Dr Neldon Mc

## 2019-05-08 NOTE — Progress Notes (Signed)
Amy Escobar, female    DOB: 1946-05-29    MRN: [id number]   Brief patient profile:  19 yobf quit smoking for good 1980s with h/o GERD going back to at least 2010 with neg egd by [facility] 09/06/12   And some tendency for colds to settle in sinuses and tendency for pnds req daily zyrtec x 2018 - was eval in pulmonary clinic with nl pfts/unexplained doe and cough attributed to uacs and started on gabapentin but did not return p 09/12/13 with  recs  As follows:  Increase neurontin to 300 mg  three times a day to see if helps your cough  If still have drainage a recommend a trial of zyrtec 10 mg daily and possible an allergy evaluation   Improved p this eval and req gabapentin up to 600 tid for dm neuropathy but adjusted this down on her own due to excess dayime drowsiness at only taking 600 mg at when self referred back to pulmonary clinic 08/22/2018 due to cough since mid Jan 2020 s/p 2 zpaks     History of Present Illness  08/22/2018  Pulmonary/ 1st office eval/Amy Escobar  Chief Complaint  Patient presents with  . Pulmonary Consult    Self referral. Pt c/o cough x 5 wks- currently prod with clear sputum.   Dyspnea:  Does flat and slow = MMRC2 = can't walk a nl pace on a flat grade s sob but does fine slow and flat also limited by knees  Cough: urge to clear throat x 2013 / pred did not help / daytime aggravated by voice use  Sleep: on one pillow / bed is flat sometimes wakes her up / rec Increase you nexium to 40 mg Take 30- 60 min before your first and last meals of the day  Increase your gabapentin to 300 mg each am and continue 600 mg at bedtime  For cough as needed >  Tessalon 200 mg up to every 6 hours and the goal is no coughing at all  GERD  Diet       11/08/2018  f/u ov/Amy Escobar re: uacs improved vs prior  Chief Complaint  Patient presents with  . Follow-up    Cough is about the same. No new co's today.   Dyspnea:  MMRC1 = can walk nl pace, flat grade, can't hurry or go uphills or  steps s sob  Cough: improved but needing tessilon 200 up to twice daily  Sleeping: on wedge wake up with dry cough about  twice weekly  SABA use: none  02: none  rec For drainage / throat tickle try take CHLORPHENIRAMINE  4 mg  (Chlortab 4mg   at [facility] should be easiest to find in the green box)  take one every 4 hours as needed - available over the counter- may cause drowsiness so start with just a bedtime dose or two and see how you tolerate it before trying in daytime Ok to take  zyrtec otc daily  but you may find you don't need it anymore    - try and see  Please schedule a follow up visit in 6 months but call sooner if needed  - consider adding singulair if hasn't tried it then allergy eval next step        04/19/19 televist For drainage / throat tickle try take CHLORPHENIRAMINE  4 mg  (Chlortab 4mg   at [facility] should be easiest to find in the green box)  take one every 4 hours  as needed - available over the counter and take 2 tablets about one hour before bed   Renewed Tessalon to take as needed for   Restart gabapentin  300 mg in am  600 mg after supper   Start singulair 10 mg each pm    Keep your previous appt to see me but  Please bring  all medications /inhalers/ solutions in hand so we can verify exactly what you are taking. This includes all medications from all doctors and over the [place] separate them into two bags:  the ones you take automatically, no matter what, vs the ones you take just when you feel you need them "BAG #2 is UP TO YOU"  - this will really help [place] help you take your medications more effectively.    05/08/2019  f/u ov/Amy Escobar re: cough x 2013 - brought most of her meds no h1/ no gabapentin Chief Complaint  Patient presents with  . Follow-up    Cough-"sometimes I feel like it's worse"- occ prod with white or clear sputum.   Dyspnea: housework  Cough: sporadic/ tessalon helps when takes it  Sleeping: wakes up sev times a  week coughing  SABA use: none  02: none    No obvious day to day or daytime variability or assoc excess/ purulent sputum or mucus plugs or hemoptysis or cp or chest tightness, subjective wheeze or overt sinus or hb symptoms.     Also denies any obvious fluctuation of symptoms with weather or environmental changes or other aggravating or alleviating factors except as outlined above   No unusual exposure hx or h/o childhood pna/ asthma or knowledge of premature birth.  Current Allergies, Complete Past Medical History, Past Surgical History, Family History, and Social History were reviewed in [employer] record.  ROS  The following are not active complaints unless bolded Hoarseness, sore throat, dysphagia, dental problems, itching, sneezing,  nasal congestion or discharge of excess mucus or purulent secretions, ear ache,   fever, chills, sweats, unintended wt loss or wt gain, classically pleuritic or exertional cp,  orthopnea pnd or arm/hand swelling  or leg swelling, presyncope, palpitations, abdominal pain, anorexia, nausea, vomiting, diarrhea  or change in bowel habits or change in bladder habits, change in stools or change in urine, dysuria, hematuria,  rash, arthralgias, visual complaints, headache, numbness, weakness or ataxia or problems with walking or coordination,  change in mood or  memory.        Current Meds  Medication Sig  . amLODipine (NORVASC) 2.5 MG tablet TAKE ONE TABLET BY MOUTH DAILY  . aspirin EC 81 MG tablet Take 81 mg by mouth daily.  . benzonatate (TESSALON) 200 MG capsule Take 1 capsule (200 mg total) by mouth 3 (three) times daily as needed for cough.  . canagliflozin (INVOKANA) 100 MG TABS tablet Take 1 tablet (100 mg total) by mouth every other day.  . Chlorphen-Pseudoephed-APAP (CORICIDIN D PO) Take by mouth as needed.   . chlorpheniramine (CHLOR-TRIMETON) 4 MG tablet Take 4 mg by mouth every 4 (four) hours as needed for allergies.  Marland Kitchen  esomeprazole (NEXIUM) 40 MG capsule TAKE ONE CAPSULE BY MOUTH 30 TO 60 MINUTES BEFORE YOUR FIRST AND LAST MEALS OF THE DAY  . fesoterodine (TOVIAZ) 4 MG TB24 tablet Take 4 mg daily by mouth.  Marland Kitchen glucose blood (ONETOUCH VERIO) test strip 1 each by Other route daily.  . hydrocortisone 2.5 % lotion   . montelukast (SINGULAIR) 10 MG tablet Take  1 tablet (10 mg total) by mouth at bedtime.  . rosuvastatin (CRESTOR) 5 MG tablet TAKE ONE TABLET BY MOUTH DAILY  . triamcinolone cream (KENALOG) 0.1 % APPLY 1 APPLICATION TOPICALLY 4 (FOUR) TIMES DAILY AS NEEDED FOR RASH  . UNKNOWN TO PATIENT Inject as directed every 6 (six) months. 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 valsartan (DIOVAN) 80 MG tablet TAKE ONE TABLET BY MOUTH DAILY  . Vitamin D, Ergocalciferol, (DRISDOL) 1.25 MG (50000 UT) CAPS capsule TAKE ONE CAPSULE BY MOUTH ONCE WEEKLY          Objective:     05/08/2019        154   11/08/18 173 lb (78.5 kg)  10/26/18 167 lb (75.8 kg)  09/14/18 162 lb (73.5 kg)   amb bf nad  BP 122/60 (BP Location: Left Arm, Cuff Size: Normal)   Pulse 93   Temp (!) 97.3 F (36.3 C) (Temporal)   Ht 5' 4.5" (1.638 m)   Wt 154 lb 12.8 oz (70.2 kg)   SpO2 100% Comment: on RA  BMI 26.16 kg/m      HEENT : pt wearing mask not removed for exam due to covid -19 concerns.    NECK :  without JVD/Nodes/TM/ nl carotid upstrokes bilaterally   LUNGS: no acc muscle use,  Nl contour chest which is clear to A and P bilaterally without cough on insp or exp maneuvers   CV:  RRR  no s3 or murmur or increase in P2, and no edema   ABD:  soft and nontender with nl inspiratory excursion in the supine position. No bruits or organomegaly appreciated, bowel sounds nl  MS:  Nl gait/ ext warm without deformities, calf tenderness, cyanosis or clubbing No obvious joint restrictions   SKIN: warm and dry without lesions    NEURO:  alert, approp, nl sensorium with  no motor or cerebellar deficits apparent.          CXR PA and Lateral:   05/08/2019 :    I personally reviewed images and agree with radiology impression as follows:    No active cardiopulmonary disease.      Assessment

## 2019-05-09 ENCOUNTER — Encounter: Payer: Self-pay | Admitting: Internal Medicine

## 2019-05-09 NOTE — Assessment & Plan Note (Addendum)
Onset was around 2013  -  Neg egd by [facility] 09/06/12  - Allergy profile 08/22/2018 >  Eos 0.2 /  IgE  2095 RAST pos for everything x cats  - 08/22/2018  gabapentin changed from 600 at hs to 300 mg each am and 600 at bedtime  - 04/19/2019 cough flared off gabapentin > resume plus add singulair trial     No evidence of any asthma here and suspect this is all driven by pnds and irritable larynx for which she can use tessalon for now and if not effective titrate up gabapentin to max of 300 mg qid ? [facility] to [facility] Gaskins at [facility] if allergy eval/rx not effective?    Advised pt to max h1 esp at hs and referred to Dr Bruna Potter group for eval for atopy based on impressive IgE and RAST testing

## 2019-05-09 NOTE — Progress Notes (Signed)
Spoke with pt and notified of results per Dr. Wert. Pt verbalized understanding and denied any questions. 

## 2019-05-09 NOTE — Assessment & Plan Note (Signed)
Onset 2015 - spirometry 08/04/13 truncated in a non-physiologic pattern  - 08/08/2013  Walked RA x 3 laps @ 185 ft each stopped due to end of study no desat  - PFT's 09/12/2013 wnl  - 05/08/2019   Walked RA  2 laps @  approx 280ft each @ avg pace  stopped due to  End of study, mild sob with sats still 93%  And nl cxr   No evidence to support asthma /copd or ILD, pulmonary f/u can be prn   I had an extended discussion with the patient reviewing all relevant studies completed to date and  lasting 15 to 20 minutes of a 25 minute final summary office  visit  which included directly observing ambulatory 02 saturation study documented in a/p section of  today's  office note.  Each maintenance medication was reviewed in detail including most importantly the difference between maintenance and prns and under what circumstances the prns are to be triggered using an action plan format that is not reflected in the computer generated alphabetically organized AVS.     Please see AVS for specific instructions unique to this visit that I personally wrote and verbalized to the the pt in detail and then reviewed with pt  by my nurse highlighting any changes in therapy recommended at today's visit .

## 2019-05-10 ENCOUNTER — Other Ambulatory Visit: Payer: Self-pay

## 2019-05-10 ENCOUNTER — Ambulatory Visit (INDEPENDENT_AMBULATORY_CARE_PROVIDER_SITE_OTHER): Payer: Medicare Other | Admitting: Family Medicine

## 2019-05-10 ENCOUNTER — Encounter: Payer: Self-pay | Admitting: Family Medicine

## 2019-05-10 DIAGNOSIS — M170 Bilateral primary osteoarthritis of knee: Secondary | ICD-10-CM

## 2019-05-10 NOTE — Assessment & Plan Note (Signed)
Patient was given bilateral steroid injections today.  Tolerated procedure well.  Discussed icing regimen and home exercise, could be a candidate for viscosupplementation if needed.  Has responded better to the steroid injections and follow-up again in 10 weeks

## 2019-05-10 NOTE — Progress Notes (Signed)
Amy Escobar, [place] [address] Phone: [phone] Subjective:   I Amy Escobar am serving as a Education administrator for Dr. Hulan Saas.  CC: Bilateral knee pain follow-up  [id number]   03/03/2019 Bilateral injections given today.  Tolerated the procedure well.  Patient did respond somewhat to the Visco supplementation but not significant enough that did not need steroid injections today.  Can repeat again in 10 weeks.  Patient will consider this.  Otherwise in November 20 would be able to do Visco supplementation again  05/10/2019 Amy Escobar is a 73 y.o. female coming in with complaint of bilateral knee pain. Patient states the knees are doing alright.  Patient has known arthritic changes.  10 weeks ago was given injections.  Feels like they do seem to be doing well.  Feels like the viscosupplementation does not make a big difference anymore.     Past Medical History:  Diagnosis Date  . Alkaline phosphatase deficiency    w/u Ne  . Allergic rhinitis   . Anxiety   . Arthritis   . DM2 (diabetes mellitus, type 2) ([place])   . GERD (gastroesophageal reflux disease)   . Gout   . Hemorrhoids   . Hyperlipidemia   . Hypertension   . Mild pulmonary hypertension ([place])   . NSVT (nonsustained ventricular tachycardia) ([place])   . Osteoporosis   . PVC's (premature ventricular contractions)   . Thyroid nodule    small  . Uterine fibroid    Past Surgical History:  Procedure Laterality Date  . CATARACT EXTRACTION Bilateral 2016  . COLONOSCOPY  2007  . echocardiogram (other)  01/16/2002  . 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 stress cardiolite  02/12/2006  . TOENAIL EXCISION    . TUBAL LIGATION     Social History   Socioeconomic History  . Marital status: Widowed    Spouse name: Not on file  . Number of children: 2  . Years of education: Not on file  . Highest education level: Not on file  Occupational History  . Occupation: Lexicographer: UNEMPLOYED  Social Needs  . Financial resource strain: Not on file  . Food insecurity    Worry: Not on file    Inability: Not on file  . Transportation needs    Medical: Not on file    Non-medical: Not on file  Tobacco Use  . Smoking status: Former Smoker    Packs/day: 0.25    Years: 5.00    Pack years: 1.25    Types: Cigarettes    Quit date: 06/29/1968    Years since quitting: 50.8  . Smokeless tobacco: Never Used  Substance and Sexual Activity  . Alcohol use: No    Alcohol/week: 0.0 standard drinks  . Drug use: No  . Sexual activity: Not on file  Lifestyle  . Physical activity    Days per week: Not on file    Minutes per session: Not on file  . Stress: Not on file  Relationships  . Social Herbalist on phone: Not on file    Gets together: Not on file    Attends religious service: Not on file    Active member of club or organization: Not on file    Attends meetings of clubs or organizations: Not on file    Relationship status: Not on file  Other Topics Concern  . Not on file  Social  History Narrative   Widowed 2010.    Allergies  Allergen Reactions  . Olmesartan Medoxomil     REACTION: headache   Family History  Problem Relation Age of Onset  . Heart disease Mother   . Heart disease Father   . Heart disease Maternal Grandfather   . Rectal cancer Maternal Grandfather   . Stomach cancer Maternal Grandmother   . Colon cancer Neg Hx   . Breast cancer Neg Hx     Current Outpatient Medications (Endocrine & Metabolic):  .  canagliflozin (INVOKANA) 100 MG TABS tablet, Take 1 tablet (100 mg total) by mouth every other day.  Current Outpatient Medications (Cardiovascular):  .  amLODipine (NORVASC) 2.5 MG tablet, TAKE ONE TABLET BY MOUTH DAILY .  rosuvastatin (CRESTOR) 5 MG tablet, TAKE ONE TABLET BY MOUTH DAILY .  valsartan (DIOVAN) 80 MG tablet, TAKE ONE TABLET BY MOUTH DAILY  Current Outpatient Medications (Respiratory):  .   benzonatate (TESSALON) 200 MG capsule, Take 1 capsule (200 mg total) by mouth 3 (three) times daily as needed for cough. .  Chlorphen-Pseudoephed-APAP (CORICIDIN D PO), Take by mouth as needed.  .  chlorpheniramine (CHLOR-TRIMETON) 4 MG tablet, Take 4 mg by mouth every 4 (four) hours as needed for allergies. .  montelukast (SINGULAIR) 10 MG tablet, Take 1 tablet (10 mg total) by mouth at bedtime.  Current Outpatient Medications (Analgesics):  .  aspirin EC 81 MG tablet, Take 81 mg by mouth daily.   Current Outpatient Medications (Other):  .  esomeprazole (NEXIUM) 40 MG capsule, TAKE ONE CAPSULE BY MOUTH 30 TO 60 MINUTES BEFORE YOUR FIRST AND LAST MEALS OF THE DAY .  fesoterodine (TOVIAZ) 4 MG TB24 tablet, Take 4 mg daily by mouth. Marland Kitchen  glucose blood (ONETOUCH VERIO) test strip, 1 each by Other route daily. .  hydrocortisone 2.5 % lotion,  .  triamcinolone cream (KENALOG) 0.1 %, APPLY 1 APPLICATION TOPICALLY 4 (FOUR) TIMES DAILY AS NEEDED FOR RASH .  UNKNOWN TO PATIENT, Inject as directed every 6 (six) months. 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  Vitamin D, Ergocalciferol, (DRISDOL) 1.25 MG (50000 UT) CAPS capsule, TAKE ONE CAPSULE BY MOUTH ONCE WEEKLY    Past medical history, social, surgical and family history all reviewed in electronic medical record.  No pertanent information unless stated regarding to the chief complaint.   Review of Systems:  No headache, visual changes, nausea, vomiting, diarrhea, constipation, dizziness, abdominal pain, skin rash, fevers, chills, night sweats, weight loss, swollen lymph nodes, body aches, joint swelling, muscle aches, chest pain, shortness of breath, mood changes.   Objective  Blood pressure 90/60, pulse 97, height 5' 4.5" (1.638 m), weight 154 lb 12.8 oz (70.2 kg), SpO2 97 %.    General: No apparent distress alert and oriented x3 mood and affect normal, dressed appropriately.  HEENT: Pupils equal, extraocular movements intact   Respiratory: Patient's speak in full sentences and does not appear short of breath  Cardiovascular: No lower extremity edema, non tender, no erythema  Skin: Warm dry intact with no signs of infection or rash on extremities or on axial skeleton.  Abdomen: Soft nontender  Neuro: Cranial nerves II through XII are intact, neurovascularly intact in all extremities with 2+ DTRs and 2+ pulses.  Lymph: No lymphadenopathy of posterior or anterior cervical chain or axillae bilaterally.  Gait antalgic gait MSK:  tender with mild limited range of motion and good stability and symmetric strength and tone of shoulders, elbows, wrist, hip,  kne and ankles bilaterally.  Knee: Bilateral  valgus deformity noted.  Abnormal thigh to calf ratio.  Tender to palpation over medial and PF joint line.  ROM full in flexion and extension and lower leg rotation. instability with valgus force.  painful patellar compression. Patellar glide with moderate crepitus. Patellar and quadriceps tendons unremarkable. Hamstring and quadriceps strength is normal.  After informed written and verbal consent, patient was seated on exam table. Right knee was prepped with alcohol swab and utilizing anterolateral approach, patient's right knee space was injected with 4:1  marcaine 0.5%: Kenalog 40mg /dL. Patient tolerated the procedure well without immediate complications.  After informed written and verbal consent, patient was seated on exam table. Left knee was prepped with alcohol swab and utilizing anterolateral approach, patient's left knee space was injected with 4:1  marcaine 0.5%: Kenalog 40mg /dL. Patient tolerated the procedure well without immediate complications.   Impression and Recommendations:     This case required medical decision making of moderate complexity. The above documentation has been reviewed and is accurate and complete Lyndal Pulley, DO       Note: This dictation was prepared with Dragon dictation along  with smaller phrase technology. Any transcriptional errors that result from this process are unintentional.

## 2019-05-11 ENCOUNTER — Ambulatory Visit: Payer: Medicare Other | Admitting: Internal Medicine

## 2019-05-15 DIAGNOSIS — N1330 Unspecified hydronephrosis: Secondary | ICD-10-CM | POA: Diagnosis not present

## 2019-05-18 DIAGNOSIS — R3914 Feeling of incomplete bladder emptying: Secondary | ICD-10-CM | POA: Diagnosis not present

## 2019-05-18 DIAGNOSIS — N3000 Acute cystitis without hematuria: Secondary | ICD-10-CM | POA: Diagnosis not present

## 2019-05-18 DIAGNOSIS — N130 Hydronephrosis with ureteropelvic junction obstruction: Secondary | ICD-10-CM | POA: Diagnosis not present

## 2019-05-18 DIAGNOSIS — R8279 Other abnormal findings on microbiological examination of urine: Secondary | ICD-10-CM | POA: Diagnosis not present

## 2019-05-24 ENCOUNTER — Other Ambulatory Visit: Payer: Self-pay | Admitting: Endocrinology

## 2019-06-19 ENCOUNTER — Other Ambulatory Visit: Payer: Self-pay | Admitting: Family Medicine

## 2019-06-21 ENCOUNTER — Telehealth: Payer: Self-pay | Admitting: Cardiology

## 2019-06-21 DIAGNOSIS — I2510 Atherosclerotic heart disease of native coronary artery without angina pectoris: Secondary | ICD-10-CM

## 2019-06-21 DIAGNOSIS — E782 Mixed hyperlipidemia: Secondary | ICD-10-CM

## 2019-06-21 NOTE — Telephone Encounter (Signed)
CMET and FLP

## 2019-06-21 NOTE — Telephone Encounter (Signed)
I spoke with pt. She is calling to schedule lab work prior to her visit in March.  I told her I would send message to Dr Meda Coffee to see what she should have checked.

## 2019-06-21 NOTE — Telephone Encounter (Signed)
  New message  Patient requesting order for 6 month lab draw

## 2019-06-22 NOTE — Telephone Encounter (Signed)
I spoke with pt. She will come to office for fasting lab work on 08/22/19 at 7:30

## 2019-06-26 ENCOUNTER — Ambulatory Visit: Payer: Medicare Other | Admitting: Allergy and Immunology

## 2019-06-26 ENCOUNTER — Other Ambulatory Visit: Payer: Self-pay

## 2019-06-26 ENCOUNTER — Encounter: Payer: Self-pay | Admitting: Allergy and Immunology

## 2019-06-26 VITALS — BP 122/70 | HR 95 | Temp 98.0°F | Resp 18 | Ht 64.5 in | Wt 154.6 lb

## 2019-06-26 DIAGNOSIS — R05 Cough: Secondary | ICD-10-CM | POA: Diagnosis not present

## 2019-06-26 DIAGNOSIS — J3089 Other allergic rhinitis: Secondary | ICD-10-CM

## 2019-06-26 DIAGNOSIS — R0609 Other forms of dyspnea: Secondary | ICD-10-CM

## 2019-06-26 DIAGNOSIS — K219 Gastro-esophageal reflux disease without esophagitis: Secondary | ICD-10-CM | POA: Diagnosis not present

## 2019-06-26 DIAGNOSIS — R053 Chronic cough: Secondary | ICD-10-CM

## 2019-06-26 MED ORDER — RESPIRATORY THERAPY SUPPLIES MISC: 1 refills | Status: DC

## 2019-06-26 MED ORDER — AZELASTINE-FLUTICASONE 137-50 MCG/ACT NA SUSP: 1.0000 | Freq: Two times a day (BID) | NASAL | 5 refills | Status: DC

## 2019-06-26 MED ORDER — CARBINOXAMINE MALEATE 4 MG PO TABS: 4.0000 mg | ORAL_TABLET | Freq: Three times a day (TID) | ORAL | 5 refills | Status: DC | PRN

## 2019-06-26 NOTE — Assessment & Plan Note (Signed)
   Appropriate reflux lifestyle modifications have been provided.  For now, continue esomeprazole (Nexium) 40 mg twice daily as prescribed.  If this problem persists or progresses follow-up with your gastroenterologist.

## 2019-06-26 NOTE — Assessment & Plan Note (Signed)
Aeroallergens skin testing revealed reactivity to grass pollen, ragweed pollen, weed pollen, tree pollen, molds, cockroach antigen, and dust mite antigen.  Aeroallergen avoidance measures have been discussed and provided in written form.  A prescription has been provided for azelastine/fluticasone nasal spray, 1 spray per nostril twice daily as needed. Proper nasal spray technique has been discussed and demonstrated.  Nasal saline lavage (NeilMed) has been recommended as needed and prior to medicated nasal sprays along with instructions for proper administration.  A prescription has been provided for carbinoxamine 4 mg every 8 hours if needed.  Due to the potential for somnolence, the patient has been asked to start this medication at bedtime first to see how she does before taking this medication during the daytime.  For now, continue montelukast 10 mg daily at bedtime.  Discontinue cetirizine (Zyrtec).

## 2019-06-26 NOTE — Assessment & Plan Note (Addendum)
The history and physical examination suggest that her cough is multifactorial with contribution from postnasal drainage and laryngopharyngeal reflux. We will address these issues at this time.   A prescription has been provided for a flutter valve to be used as needed to break the coughing cycle.  Prednisone has been provided, 20 mg x3 days, 10 mg x1 day, then stop.  Continue Tessalon Perles (benzonatate) as needed.  Continue gabapentin as prescribed.  Treatment plan as outlined below.

## 2019-06-26 NOTE — Progress Notes (Signed)
New Patient Note  RE: Amy Escobar MRN: [id number] DOB: [birth year] Date of Office Visit: 06/26/2019  Referring provider: Tanda Rockers, MD Primary care provider: [name], FNP  Chief Complaint: Cough and Sinus Problem  History of present illness: Amy Escobar is a 73 y.o. female seen today in consultation requested by Amy Gully, MD.  She complains of a persistent cough since January 2020.  She states that she coughs "every morning and all through the day."  She reports that the cough seems to originate in the base of the throat with a globus sensation.  On occasion, she is able to expectorate thick, clear mucus.  She denies fevers, chills, and discolored mucus production.  She takes small sips of water in an attempt to control the cough.   She experiences nasal congestion, thick postnasal drainage, and frontal/ethmoidal sinus pressure.  No significant seasonal symptom variation has been noted nor have specific environmental triggers been identified. She was started on montelukast and chlorpheniramine earlier this year without perceived benefit.  She has tried cetirizine without perceived benefit.  She states that [employer] is the only medication that has provided benefit to date.  Despite compliance with Nexium 40 mg twice daily she still experiences heartburn that "gets so bad" she requires Alka-Seltzer 2 or 3 times per week on average.   She reports dyspnea on exertion.  She does not believe that she would be able to walk 1 city block on flat surface without becoming short of breath.  She experiences some chest tightness while coughing.  She denies wheezing.  She had normal PFTs in 2015. She does not believe that she has had PFTs since that time and there is nothing in her record to indicate that she has.  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    Assessment and plan: Cough, persistent The history and physical examination suggest that her  cough is multifactorial with contribution from postnasal drainage and laryngopharyngeal reflux. We will address these issues at this time.   A prescription has been provided for a flutter valve to be used as needed to break the coughing cycle.  Prednisone has been provided, 20 mg x3 days, 10 mg x1 day, then stop.  Continue Tessalon Perles (benzonatate) as needed.  Continue gabapentin as prescribed.  Treatment plan as outlined below.    Perennial and seasonal allergic rhinitis Aeroallergens skin testing revealed reactivity to grass pollen, ragweed pollen, weed pollen, tree pollen, molds, cockroach antigen, and dust mite antigen.  Aeroallergen avoidance measures have been discussed and provided in written form.  A prescription has been provided for azelastine/fluticasone nasal spray, 1 spray per nostril twice daily as needed. Proper nasal spray technique has been discussed and demonstrated.  Nasal saline lavage (NeilMed) has been recommended as needed and prior to medicated nasal sprays along with instructions for proper administration.  A prescription has been provided for carbinoxamine 4 mg every 8 hours if needed.  Due to the potential for somnolence, the patient has been asked to start this medication at bedtime first to see how she does before taking this medication during the daytime.  For now, continue montelukast 10 mg daily at bedtime.  Discontinue cetirizine (Zyrtec).  GERD  Appropriate reflux lifestyle modifications have been provided.  For now, continue esomeprazole (Nexium) 40 mg twice daily as prescribed.  If this problem persists or progresses follow-up with your gastroenterologist.   Meds ordered this encounter  Medications  .  Azelastine-Fluticasone 137-50 MCG/ACT SUSP    Sig: Place 1 spray into the nose 2 (two) times daily.    Dispense:  23 g    Refill:  5  . Carbinoxamine Maleate 4 MG TABS    Sig: Take 1 tablet (4 mg total) by mouth every 8 (eight) hours as  needed.    Dispense:  56 tablet    Refill:  5    Start with taking medication at bedtime  . Respiratory Therapy Supplies MISC    Sig: Use flutter valve as needed to break the coughing cycle.    Dispense:  1 each    Refill:  1    DME Flutter valve for Persistent Cough (R05).    Diagnostics: Spirometry: FVC was 1.96 L and FEV1 was 1.68 L (91% predicted) without significant postbronchodilator improvement.  Please see scanned spirometry results for details. Allergy skin testing: Positive to grass pollen, weed pollen, ragweed pollen, tree pollen, molds, cockroach antigen, and dust mite antigen.  Physical examination: Blood pressure 122/70, pulse 95, temperature 98 F (36.7 C), temperature source Temporal, resp. rate 18, height 5' 4.5" (1.638 m), weight 154 lb 9.6 oz (70.1 kg), SpO2 97 %.  General: Alert, interactive, in no acute distress. HEENT: TMs pearly gray, turbinates moderately edematous without discharge, post-pharynx moderately erythematous. Neck: Supple without lymphadenopathy. Lungs: Clear to auscultation without wheezing, rhonchi or rales. CV: Normal S1, S2 without murmurs. Abdomen: Nondistended, nontender. Skin: Warm and dry, without lesions or rashes. Extremities:  No clubbing, cyanosis or edema. Neuro:   Grossly intact.  Review of systems:  Review of systems negative except as noted in HPI / PMHx or noted below: Review of Systems  Constitutional: Negative.   HENT: Negative.   Eyes: Negative.   Respiratory: Negative.   Cardiovascular: Negative.   Gastrointestinal: Negative.   Genitourinary: Negative.   Musculoskeletal: Negative.   Skin: Negative.   Neurological: Negative.   Endo/Heme/Allergies: Negative.   Psychiatric/Behavioral: Negative.     Past medical history:  Past Medical History:  Diagnosis Date  . Alkaline phosphatase deficiency    w/u Ne  . Allergic rhinitis   . Anxiety   . Arthritis   . DM2 (diabetes mellitus, type 2) ([place])   . GERD  (gastroesophageal reflux disease)   . Gout   . Hemorrhoids   . Hyperlipidemia   . Hypertension   . Mild pulmonary hypertension ([place])   . NSVT (nonsustained ventricular tachycardia) ([place])   . Osteoporosis   . PVC's (premature ventricular contractions)   . Thyroid nodule    small  . Urticaria   . Uterine fibroid     Past surgical history:  Past Surgical History:  Procedure Laterality Date  . CATARACT EXTRACTION Bilateral 2016  . COLONOSCOPY  2007  . echocardiogram (other)  01/16/2002  . 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 stress cardiolite  02/12/2006  . TOENAIL EXCISION    . TUBAL LIGATION    . TYMPANOSTOMY TUBE PLACEMENT      Family history: Family History  Problem Relation Age of Onset  . Heart disease Mother   . Allergic rhinitis Mother   . Heart disease Father   . Heart disease Maternal Grandfather   . Rectal cancer Maternal Grandfather   . Stomach cancer Maternal Grandmother   . Colon cancer Neg Hx   . Breast cancer Neg Hx     Social history: Social History   Socioeconomic History  . Marital status: Widowed    Spouse name:  Not on file  . Number of children: 2  . Years of education: Not on file  . Highest education level: Not on file  Occupational History  . Occupation: Surveyor, minerals: UNEMPLOYED  Tobacco Use  . Smoking status: Former Smoker    Packs/day: 0.25    Years: 5.00    Pack years: 1.25    Types: Cigarettes    Quit date: 06/29/1968    Years since quitting: 51.0  . Smokeless tobacco: Never Used  Substance and Sexual Activity  . Alcohol use: No    Alcohol/week: 0.0 standard drinks  . Drug use: No  . Sexual activity: Not on file  Other Topics Concern  . Not on file  Social History Narrative   Widowed 2010.    Social Determinants of Health   Financial Resource Strain:   . Difficulty of Paying Living Expenses: Not on file  Food Insecurity:   . Worried About Charity fundraiser in the Last Year: Not on file  . Ran Out of  Food in the Last Year: Not on file  Transportation Needs:   . Lack of Transportation (Medical): Not on file  . Lack of Transportation (Non-Medical): Not on file  Physical Activity:   . Days of Exercise per Week: Not on file  . Minutes of Exercise per Session: Not on file  Stress:   . Feeling of Stress : Not on file  Social Connections:   . Frequency of Communication with Friends and Family: Not on file  . Frequency of Social Gatherings with Friends and Family: Not on file  . Attends Religious Services: Not on file  . Active Member of Clubs or Organizations: Not on file  . Attends Archivist Meetings: Not on file  . Marital Status: Not on file  Intimate Partner Violence:   . Fear of Current or Ex-Partner: Not on file  . Emotionally Abused: Not on file  . Physically Abused: Not on file  . Sexually Abused: Not on file    Environmental History: The patient lives in 73 year old apartment with carpeting throughout and central air/heat.  There is a history of mold/water damage in the apartment which has been remediated.  She has no pets.  She started smoking cigarettes when she was 73 years old and quit in her 19s.  She claims that she has never inhaled cigarette smoke.  Current Outpatient Medications  Medication Sig Dispense Refill  . amLODipine (NORVASC) 2.5 MG tablet TAKE ONE TABLET BY MOUTH DAILY 90 tablet 0  . aspirin EC 81 MG tablet Take 81 mg by mouth daily.    . benzonatate (TESSALON) 200 MG capsule Take 1 capsule (200 mg total) by mouth 3 (three) times daily as needed for cough. 45 capsule 2  . canagliflozin (INVOKANA) 100 MG TABS tablet Take 1 tablet (100 mg total) by mouth every other day. 30 tablet 5  . Chlorphen-Pseudoephed-APAP (CORICIDIN D PO) Take by mouth as needed.     . chlorpheniramine (CHLOR-TRIMETON) 4 MG tablet Take 4 mg by mouth every 4 (four) hours as needed for allergies.    Marland Kitchen esomeprazole (NEXIUM) 40 MG capsule TAKE ONE CAPSULE BY MOUTH 30 TO 60 MINUTES  BEFORE YOUR FIRST AND LAST MEALS OF THE DAY 180 capsule 1  . fesoterodine (TOVIAZ) 4 MG TB24 tablet Take 4 mg daily by mouth.    Marland Kitchen glucose blood (ONETOUCH VERIO) test strip 1 each by Other route daily. 100 each 12  . hydrocortisone  2.5 % lotion     . metFORMIN (GLUCOPHAGE-XR) 500 MG 24 hr tablet TAKE TWO TABLETS BY MOUTH TWICE A DAY 360 tablet 0  . montelukast (SINGULAIR) 10 MG tablet Take 1 tablet (10 mg total) by mouth at bedtime. 30 tablet 11  . rosuvastatin (CRESTOR) 5 MG tablet TAKE ONE TABLET BY MOUTH DAILY 90 tablet 3  . triamcinolone cream (KENALOG) 0.1 % APPLY 1 APPLICATION TOPICALLY 4 (FOUR) TIMES DAILY AS NEEDED FOR RASH 45 g 1  . UNKNOWN TO PATIENT Inject as directed every 6 (six) months. 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 valsartan (DIOVAN) 80 MG tablet TAKE ONE TABLET BY MOUTH DAILY 90 tablet 3  . Vitamin D, Ergocalciferol, (DRISDOL) 1.25 MG (50000 UT) CAPS capsule TAKE ONE CAPSULE BY MOUTH ONCE WEEKLY 12 capsule 0  . Azelastine-Fluticasone 137-50 MCG/ACT SUSP Place 1 spray into the nose 2 (two) times daily. 23 g 5  . Carbinoxamine Maleate 4 MG TABS Take 1 tablet (4 mg total) by mouth every 8 (eight) hours as needed. 56 tablet 5  . Respiratory Therapy Supplies MISC Use flutter valve as needed to break the coughing cycle. 1 each 1   No current facility-administered medications for this visit.    Known medication allergies: Allergies  Allergen Reactions  . Olmesartan Medoxomil     REACTION: headache    I appreciate the opportunity to take part in [facility] care. Please do not hesitate to contact me with questions.  Sincerely,   R. Edgar Frisk, MD

## 2019-06-26 NOTE — Patient Instructions (Addendum)
Cough, persistent The history and physical examination suggest that her cough is multifactorial with contribution from postnasal drainage and laryngopharyngeal reflux. We will address these issues at this time.   A prescription has been provided for a flutter valve to be used as needed to break the coughing cycle.  Prednisone has been provided, 20 mg x3 days, 10 mg x1 day, then stop.  Continue Tessalon Perles (benzonatate) as needed.  Continue gabapentin as prescribed.  Treatment plan as outlined below.    Perennial and seasonal allergic rhinitis Aeroallergens skin testing revealed reactivity to grass pollen, ragweed pollen, weed pollen, tree pollen, molds, cockroach antigen, and dust mite antigen.  Aeroallergen avoidance measures have been discussed and provided in written form.  A prescription has been provided for azelastine/fluticasone nasal spray, 1 spray per nostril twice daily as needed. Proper nasal spray technique has been discussed and demonstrated.  Nasal saline lavage (NeilMed) has been recommended as needed and prior to medicated nasal sprays along with instructions for proper administration.  A prescription has been provided for carbinoxamine 4 mg every 8 hours if needed.  Due to the potential for somnolence, the patient has been asked to start this medication at bedtime first to see how she does before taking this medication during the daytime.  For now, continue montelukast 10 mg daily at bedtime.  Discontinue cetirizine (Zyrtec).  GERD  Appropriate reflux lifestyle modifications have been provided.  For now, continue esomeprazole (Nexium) 40 mg twice daily as prescribed.  If this problem persists or progresses follow-up with your gastroenterologist.   Return in about 3 months (around 09/24/2019), or if symptoms worsen or fail to improve.  Control of Dust Mite Allergen  House dust mites play a major role in allergic asthma and rhinitis.  They occur in  environments with high humidity wherever human skin, the food for dust mites is found. High levels have been detected in dust obtained from mattresses, pillows, carpets, upholstered furniture, bed covers, clothes and soft toys.  The principal allergen of the house dust mite is found in its feces.  A gram of dust may contain 1,000 mites and 250,000 fecal particles.  Mite antigen is easily measured in the air during house cleaning activities.    1. Encase mattresses, including the box spring, and pillow, in an air tight cover.  Seal the zipper end of the encased mattresses with wide adhesive tape. 2. Wash the bedding in water of 130 degrees Farenheit weekly.  Avoid cotton comforters/quilts and flannel bedding: the most ideal bed covering is the dacron comforter. 3. Remove all upholstered furniture from the bedroom. 4. Remove carpets, carpet padding, rugs, and non-washable window drapes from the bedroom.  Wash drapes weekly or use plastic window coverings. 5. Remove all non-washable stuffed toys from the bedroom.  Wash stuffed toys weekly. 6. Have the room cleaned frequently with a vacuum cleaner and a damp dust-mop.  The patient should not be in a room which is being cleaned and should wait 1 hour after cleaning before going into the room. 7. Close and seal all heating outlets in the bedroom.  Otherwise, the room will become filled with dust-laden air.  An electric heater can be used to heat the room. Reduce indoor humidity to less than 50%.  Do not use a humidifier.   Reducing Pollen Exposure  The American Academy of Allergy, Asthma and Immunology suggests the following steps to reduce your exposure to pollen during allergy seasons.    1. Do not hang sheets  or clothing out to dry; pollen may collect on these items. 2. Do not mow lawns or spend time around freshly cut grass; mowing stirs up pollen. 3. Keep windows closed at night.  Keep car windows closed while driving. 4. Minimize morning  activities outdoors, a time when pollen counts are usually at their highest. 5. Stay indoors as much as possible when pollen counts or humidity is high and on windy days when pollen tends to remain in the air longer. 6. Use air conditioning when possible.  Many air conditioners have filters that trap the pollen spores. 7. Use a HEPA room air filter to remove pollen form the indoor air you breathe.   Control of Mold Allergen  Mold and fungi can grow on a variety of surfaces provided certain temperature and moisture conditions exist.  Outdoor molds grow on plants, decaying vegetation and soil.  The major outdoor mold, Alternaria and Cladosporium, are found in very high numbers during hot and dry conditions.  Generally, a late Summer - Fall peak is seen for common outdoor fungal spores.  Rain will temporarily lower outdoor mold spore count, but counts rise rapidly when the rainy period ends.  The most important indoor molds are Aspergillus and Penicillium.  Dark, humid and poorly ventilated basements are ideal sites for mold growth.  The next most common sites of mold growth are the bathroom and the kitchen.  Outdoor [employer] 1. Use air conditioning and keep windows closed 2. Avoid exposure to decaying vegetation. 3. Avoid leaf raking. 4. Avoid grain handling. 5. Consider wearing a face mask if working in moldy areas.  Indoor Mold Control 1. Maintain humidity below 50%. 2. Clean washable surfaces with 5% bleach solution. 3. Remove sources e.g. Contaminated carpets.   Control of Cockroach Allergen  Cockroach allergen has been identified as an important cause of acute attacks of asthma, especially in urban settings.  There are fifty-five species of cockroach that exist in the [place], however only three, the [place], [place] species produce allergen that can affect patients with Asthma.  Allergens can be obtained from fecal particles, egg casings and secretions from  cockroaches.    1. Remove food sources. 2. Reduce access to water. 3. Seal access and entry points. 4. Spray runways with 0.5-1% Diazinon or Chlorpyrifos 5. Blow boric acid power under stoves and refrigerator. 6. Place bait stations (hydramethylnon) at feeding sites.  Lifestyle Changes for Controlling GERD  When you have GERD, stomach acid feels as if it's backing up toward your mouth. Whether or not you take medication to control your GERD, your symptoms can often be improved with lifestyle changes.   Raise Your Head  Reflux is more likely to strike when you're lying down flat, because stomach fluid can  flow backward more easily. Raising the head of your bed 4-6 inches can help. To do this:  Slide blocks or books under the legs at the head of your bed. Or, place a wedge under  the mattress. Many foam stores can make a suitable wedge for you. The wedge  should run from your waist to the top of your head.  Don't just prop your head on several pillows. This increases pressure on your  stomach. It can make GERD worse.  Watch Your Eating Habits Certain foods may increase the acid in your stomach or relax the lower esophageal sphincter, making GERD more likely. It's best to avoid the following:  Coffee, tea, and carbonated drinks (with and without caffeine)  Fatty, fried, or spicy food  Mint, chocolate, onions, and tomatoes  Any other foods that seem to irritate your stomach or cause you pain  Relieve the Pressure  Eat smaller meals, even if you have to eat more often.  Don't lie down right after you eat. Wait a few hours for your stomach to empty.  Avoid tight belts and tight-fitting clothes.  Lose excess weight.  Tobacco and Alcohol  Avoid smoking tobacco and drinking alcohol. They can make GERD symptoms worse.

## 2019-06-27 DIAGNOSIS — H3522 Other non-diabetic proliferative retinopathy, left eye: Secondary | ICD-10-CM | POA: Diagnosis not present

## 2019-06-27 DIAGNOSIS — H33101 Unspecified retinoschisis, right eye: Secondary | ICD-10-CM | POA: Diagnosis not present

## 2019-06-27 DIAGNOSIS — H3521 Other non-diabetic proliferative retinopathy, right eye: Secondary | ICD-10-CM | POA: Diagnosis not present

## 2019-06-27 DIAGNOSIS — E113392 Type 2 diabetes mellitus with moderate nonproliferative diabetic retinopathy without macular edema, left eye: Secondary | ICD-10-CM | POA: Diagnosis not present

## 2019-06-27 DIAGNOSIS — H43813 Vitreous degeneration, bilateral: Secondary | ICD-10-CM | POA: Diagnosis not present

## 2019-07-05 ENCOUNTER — Ambulatory Visit (INDEPENDENT_AMBULATORY_CARE_PROVIDER_SITE_OTHER): Payer: Medicare Other

## 2019-07-05 ENCOUNTER — Encounter: Payer: Self-pay | Admitting: Family Medicine

## 2019-07-05 ENCOUNTER — Ambulatory Visit (INDEPENDENT_AMBULATORY_CARE_PROVIDER_SITE_OTHER): Payer: Medicare Other | Admitting: Family Medicine

## 2019-07-05 ENCOUNTER — Other Ambulatory Visit: Payer: Self-pay

## 2019-07-05 VITALS — BP 112/70 | HR 97 | Ht 64.5 in | Wt 157.8 lb

## 2019-07-05 DIAGNOSIS — M25512 Pain in left shoulder: Secondary | ICD-10-CM | POA: Diagnosis not present

## 2019-07-05 NOTE — Patient Instructions (Addendum)
Thank you for coming in today. Call or go to the ER if you develop a large red swollen joint with extreme pain or oozing puss.   Attend PT.  Recheck in 1 month.  Return sooner if needed.

## 2019-07-05 NOTE — Progress Notes (Signed)
I, Amy Escobar, LAT, ATC, am serving as scribe for Dr. Lynne Leader.  Amy Escobar is a 74 y.o. female who presents to [place] [facility] today for L shoulder and arm pain.  She was last seen by Dr. Tamala Julian on 05/10/19 for B knee pain and B knee injections.  She was previously seen by Dr. Tamala Julian for her L shoulder on 04/07/18 and had an IM injection to help w/ her B knee pain and L shoulder pain.  Pt states that she had L ant shoulder pain radiating to left shoulder and upper arm.  Scapula and into the L UE on Sunday and continues to pain in her L scapula, shoulder and upper arm.  Pt rates her pain at a 7-8/10 and describes it as burning.  Pt has numbness and tingling into L UE.  Pt has tried Control and instrumentation engineer, Tylenol.  She is also having difficulty w/ sleeping due to not being comfortable laying on her back or L side.  She denies sternal chest pain.  She denies any exertional chest pain or palpitations.  She denies any trouble breathing.  Left upper extremity pain is worse with shoulder motion.   ROS:  As above  Exam:  BP 112/70 (BP Location: Left Arm, Patient Position: Sitting, Cuff Size: Normal)   Pulse 97   Ht 5' 4.5" (1.638 m)   Wt 157 lb 12.8 oz (71.6 kg)   SpO2 96%   BMI 26.67 kg/m  Wt Readings from Last 5 Encounters:  07/05/19 157 lb 12.8 oz (71.6 kg)  06/26/19 154 lb 9.6 oz (70.1 kg)  05/10/19 154 lb 12.8 oz (70.2 kg)  05/08/19 154 lb 12.8 oz (70.2 kg)  04/25/19 157 lb (71.2 kg)   General: Well Developed, well nourished, and in no acute distress.  Neuro/Psych: Alert and oriented x3, extra-ocular muscles intact, able to move all 4 extremities, sensation grossly intact. Skin: Warm and dry, no rashes noted.  Respiratory: Not using accessory muscles, speaking in full sentences, trachea midline.  Cardiovascular: Pulses palpable, no extremity edema. Abdomen: Does not appear distended. MSK:  C-spine normal.  Normal cervical motion.  Mild tender palpation  left trapezius otherwise cervical spine is nontender.  Left shoulder: Normal-appearing abduction full motion pain beyond 100 degrees.  External rotation full.  Internal rotation lumbar spine. Strength intact abduction external and internal rotation. Positive Hawkins and Neer's test.  Positive empty can test. Negative Yergason's and speeds test. Mildly tender palpation across pectoralis musculature  Right shoulder: Normal-appearing Full motion. Nontender. Normal strength. Negative impingement testing. Negative biceps tendinitis testing.     Lab and Radiology Results  Limited musculoskeletal ultrasound left shoulder Biceps tendon normal-appearing intact in groove. Subscapularis tendon normal-appearing Supraspinatus tendon with no visible tear normal-appearing.  Increased subacromial bursa thickness. Infraspinatus tendon normal-appearing AC joint with effusion. Normal bony structures otherwise. Impression: Subacromial bursitis  Procedure: Real-time Ultrasound Guided Injection of left subacromial bursa Device: Philips Affiniti 50G Images permanently stored and available for review in the ultrasound unit. Verbal informed consent obtained.  Discussed risks and benefits of procedure. Warned about infection bleeding damage to structures skin hypopigmentation and fat atrophy among others. Patient expresses understanding and agreement Time-out conducted.   Noted no overlying erythema, induration, or other signs of local infection.   Skin prepped in a sterile fashion.   Local anesthesia: Topical Ethyl chloride.   With sterile technique and under real time ultrasound guidance:  40 mg of Kenalog and 2 mL of Marcaine  injected easily.   Completed without difficulty   Pain immediately resolved suggesting accurate placement of the medication.   Advised to call if fevers/chills, erythema, induration, drainage, or persistent bleeding.   Images permanently stored and available for review in the  ultrasound unit.  Impression: Technically successful ultrasound guided injection.       Assessment and Plan: 74 y.o. female with left shoulder pain multifactorial.  Dominant cause of shoulder pain is rotator cuff tendinopathy/subacromial bursitis.  She also has pain in the shoulder girdle likely secondary to her rotator cuff dysfunction. Plan for injection as above as well as referral to physical therapy. If patient continues to have pain more in the anterior aspect of her shoulder/chest wall recommend follow back up with PCP for further medical evaluation and management.  I think she is low risk for significant cardiac etiology given the absence of substernal chest pain, or exertional chest pain or palpitation or shortness of breath.  Patient has a follow-up appointment scheduled already with Dr. Tamala Julian on January 20.  Certainly she can follow-up with this issue as well at that visit.  If needed happy to see her again in about a month or so or sooner if needed.    Orders Placed This Encounter  Procedures  . [place] - Upper Extremity - Limited - LEFT    Order Specific Question:   Reason for Exam (SYMPTOM  OR DIAGNOSIS REQUIRED)    Answer:   L shoulder pain    Order Specific Question:   Preferred imaging location?    Answer:   [place]  . Ambulatory referral to Physical Therapy    Referral Priority:   Routine    Referral Type:   Physical Medicine    Referral Reason:   Specialty Services Required    Requested Specialty:   Physical Therapy   No orders of the defined types were placed in this encounter.   Historical information moved to improve visibility of documentation.  Past Medical History:  Diagnosis Date  . Alkaline phosphatase deficiency    w/u Ne  . Allergic rhinitis   . Anxiety   . Arthritis   . DM2 (diabetes mellitus, type 2) ([place])   . GERD (gastroesophageal reflux disease)   . Gout   . Hemorrhoids   . Hyperlipidemia   . Hypertension   .  Mild pulmonary hypertension ([place])   . NSVT (nonsustained ventricular tachycardia) ([place])   . Osteoporosis   . PVC's (premature ventricular contractions)   . Thyroid nodule    small  . Urticaria   . Uterine fibroid    Past Surgical History:  Procedure Laterality Date  . CATARACT EXTRACTION Bilateral 2016  . COLONOSCOPY  2007  . echocardiogram (other)  01/16/2002  . 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 stress cardiolite  02/12/2006  . TOENAIL EXCISION    . TUBAL LIGATION    . TYMPANOSTOMY TUBE PLACEMENT     Social History   Tobacco Use  . Smoking status: Former Smoker    Packs/day: 0.25    Years: 5.00    Pack years: 1.25    Types: Cigarettes    Quit date: 06/29/1968    Years since quitting: 51.0  . Smokeless tobacco: Never Used  Substance Use Topics  . Alcohol use: No    Alcohol/week: 0.0 standard drinks   family history includes Allergic rhinitis in her mother; Heart disease in her father, maternal grandfather, and mother; Rectal cancer in her maternal grandfather;  Stomach cancer in her maternal grandmother.  Medications: Current Outpatient Medications  Medication Sig Dispense Refill  . amLODipine (NORVASC) 2.5 MG tablet TAKE ONE TABLET BY MOUTH DAILY 90 tablet 0  . aspirin EC 81 MG tablet Take 81 mg by mouth daily.    . Azelastine-Fluticasone 137-50 MCG/ACT SUSP Place 1 spray into the nose 2 (two) times daily. 23 g 5  . benzonatate (TESSALON) 200 MG capsule Take 1 capsule (200 mg total) by mouth 3 (three) times daily as needed for cough. 45 capsule 2  . canagliflozin (INVOKANA) 100 MG TABS tablet Take 1 tablet (100 mg total) by mouth every other day. 30 tablet 5  . Carbinoxamine Maleate 4 MG TABS Take 1 tablet (4 mg total) by mouth every 8 (eight) hours as needed. 56 tablet 5  . Chlorphen-Pseudoephed-APAP (CORICIDIN D PO) Take by mouth as needed.     . chlorpheniramine (CHLOR-TRIMETON) 4 MG tablet Take 4 mg by mouth every 4 (four) hours as needed for allergies.    Marland Kitchen  esomeprazole (NEXIUM) 40 MG capsule TAKE ONE CAPSULE BY MOUTH 30 TO 60 MINUTES BEFORE YOUR FIRST AND LAST MEALS OF THE DAY 180 capsule 1  . fesoterodine (TOVIAZ) 4 MG TB24 tablet Take 4 mg daily by mouth.    Marland Kitchen glucose blood (ONETOUCH VERIO) test strip 1 each by Other route daily. 100 each 12  . hydrocortisone 2.5 % lotion     . metFORMIN (GLUCOPHAGE-XR) 500 MG 24 hr tablet TAKE TWO TABLETS BY MOUTH TWICE A DAY 360 tablet 0  . montelukast (SINGULAIR) 10 MG tablet Take 1 tablet (10 mg total) by mouth at bedtime. 30 tablet 11  . Respiratory Therapy Supplies MISC Use flutter valve as needed to break the coughing cycle. 1 each 1  . rosuvastatin (CRESTOR) 5 MG tablet TAKE ONE TABLET BY MOUTH DAILY 90 tablet 3  . triamcinolone cream (KENALOG) 0.1 % APPLY 1 APPLICATION TOPICALLY 4 (FOUR) TIMES DAILY AS NEEDED FOR RASH 45 g 1  . UNKNOWN TO PATIENT Inject as directed every 6 (six) months. 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 valsartan (DIOVAN) 80 MG tablet TAKE ONE TABLET BY MOUTH DAILY 90 tablet 3  . Vitamin D, Ergocalciferol, (DRISDOL) 1.25 MG (50000 UT) CAPS capsule TAKE ONE CAPSULE BY MOUTH ONCE WEEKLY 12 capsule 0   No current facility-administered medications for this visit.   Allergies  Allergen Reactions  . Olmesartan Medoxomil     REACTION: headache      Discussed warning signs or symptoms. Please see discharge instructions. Patient expresses understanding.  The above documentation has been reviewed and is accurate and complete Lynne Leader

## 2019-07-12 ENCOUNTER — Other Ambulatory Visit: Payer: Self-pay

## 2019-07-12 ENCOUNTER — Ambulatory Visit: Payer: Medicare Other | Attending: Family Medicine | Admitting: Physical Therapy

## 2019-07-12 DIAGNOSIS — M545 Low back pain: Secondary | ICD-10-CM | POA: Diagnosis not present

## 2019-07-12 DIAGNOSIS — R293 Abnormal posture: Secondary | ICD-10-CM | POA: Diagnosis not present

## 2019-07-12 DIAGNOSIS — M62838 Other muscle spasm: Secondary | ICD-10-CM | POA: Insufficient documentation

## 2019-07-12 DIAGNOSIS — M25512 Pain in left shoulder: Secondary | ICD-10-CM | POA: Diagnosis not present

## 2019-07-12 DIAGNOSIS — M6281 Muscle weakness (generalized): Secondary | ICD-10-CM | POA: Diagnosis not present

## 2019-07-12 DIAGNOSIS — G8929 Other chronic pain: Secondary | ICD-10-CM | POA: Insufficient documentation

## 2019-07-12 NOTE — Patient Instructions (Signed)
Access Code: [email or web address]  URL: [email or web address]  Date: 07/12/2019  Prepared by: Raeford Razor   Exercises  Supine Shoulder Flexion Extension AAROM with Dowel - 10 reps - 2 sets - 10 hold - 2x daily - 7x weekly  Supine Shoulder External Rotation with Resistance - 10 reps - 2 sets - 5 hold - 2x daily - 7x weekly  Corner Pec Major Stretch - 3 reps - 1 sets - 30 hold - 2x daily - 7x weekly  Seated Scapular Retraction - 10 reps - 2 sets - 10 hold - 2x daily - 7x weekly

## 2019-07-13 NOTE — Therapy (Signed)
Amy Escobar, Amy Escobar, [id number] Phone: [phone]   Fax:  [phone]  Physical Therapy Treatment  Patient Details  Name: Amy Escobar MRN: [id number] Date of Birth: 13-Jun-1946 Referring Provider (PT): Dr. Lynne Leader    Encounter Date: 07/12/2019  PT End of Session - 07/12/19 0909    Visit Number  1    Number of Visits  12    Date for PT Re-Evaluation  08/23/19    PT Start Time  0834    PT Stop Time  0923    PT Time Calculation (min)  49 min    Activity Tolerance  Patient tolerated treatment well    Behavior During Therapy  Amy Escobar for tasks assessed/performed       Past Medical History:  Diagnosis Date  . Alkaline phosphatase deficiency    w/u Ne  . Allergic rhinitis   . Anxiety   . Arthritis   . DM2 (diabetes mellitus, type 2) ([place])   . GERD (gastroesophageal reflux disease)   . Gout   . Hemorrhoids   . Hyperlipidemia   . Hypertension   . Mild pulmonary hypertension ([place])   . NSVT (nonsustained ventricular tachycardia) ([place])   . Osteoporosis   . PVC's (premature ventricular contractions)   . Thyroid nodule    small  . Urticaria   . Uterine fibroid     Past Surgical History:  Procedure Laterality Date  . CATARACT EXTRACTION Bilateral 2016  . COLONOSCOPY  2007  . echocardiogram (other)  01/16/2002  . 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 stress cardiolite  02/12/2006  . TOENAIL EXCISION    . TUBAL LIGATION    . TYMPANOSTOMY TUBE PLACEMENT      There were no vitals filed for this visit.  Subjective Assessment - 07/12/19 0836    Subjective  Pt has a history of L shoulder pain that resolved evetually with time.  About 2 weeks ago she developed L shoulder pain that started mid sternum, then radiated to shoulder, arm and fingers.  Arm became cold.  She reports weakness and sensroy disturbvance.  Guided injection lat week that did help.    Pertinent History  neuropathy (diabetic),     Limitations  Lifting;House hold activities;Other (comment)   carrying   Diagnostic tests  none    Patient Stated Goals  use LUE more for activities    Currently in Pain?  Yes    Pain Score  7     Pain Location  Shoulder    Pain Orientation  Left;Lateral;Proximal;Posterior    Pain Descriptors / Indicators  Shooting;Tightness;Radiating;Aching;Sore    Pain Type  Acute pain    Pain Radiating Towards  arm L    Pain Onset  1 to 4 weeks ago    Pain Frequency  Intermittent    Aggravating Factors   lifting, reaching, carrying    Pain Relieving Factors  rest, tylenol    Effect of Pain on Daily Activities  lifting, carrying item, washing hair , helps Food Abdelrahman         [facility] PT Assessment - 07/13/19 0001      Assessment   Medical Diagnosis  L shoulder pain     Referring Provider (PT)  Dr. Lynne Leader     Onset Date/Surgical Date  --   2 weeks    Hand Dominance  Right    Next MD Visit  may F/U with primary  Prior Therapy  No       Precautions   Precautions  None      Restrictions   Weight Bearing Restrictions  No      Home Environment   Living Environment  Private residence    Living Arrangements  Alone    Type of [place] Access  Level entry      Prior Function   Level of [place]  Retired    Veterinary surgeon       Cognition   Overall Cognitive Status  Within Functional Limits for tasks assessed      Observation/Other Assessments   Focus on Therapeutic Outcomes (FOTO)   44%      Sensation   Light Touch  Appears Intact      Posture/Postural Control   Posture/Postural Control  Postural limitations    Postural Limitations  Rounded Shoulders;Forward head      AROM   Right Shoulder Flexion  110 Degrees    Right Shoulder Internal Rotation  --   Fr to lumbar   Right Shoulder External Rotation  --   Back of head    Left Shoulder Flexion  85 Degrees    Left Shoulder ABduction  70 Degrees    Left  Shoulder Internal Rotation  --   Fr to Lumbar    Left Shoulder External Rotation  --   unable to reach back of head      Strength   Left Shoulder Flexion  3-/5    Left Shoulder ABduction  3-/5    Left Shoulder Internal Rotation  4/5    Left Shoulder External Rotation  4/5         PT Long Term Goals - 07/13/19 1320      PT LONG TERM GOAL #1   Title  Pt will be able to show I with HEP upon last visit for posture and L UE    Time  6    Period  Weeks    Status  New    Target Date  08/23/19      PT LONG TERM GOAL #2   Title  Pt will be able to score no more than 32% impaired on FOTO to demo functional improvement    Time  6    Period  Weeks    Status  New    Target Date  08/23/19      PT LONG TERM GOAL #3   Title  Pt will be able to drive and cook using her non dominant (L) hand with no increase in pain.    Time  6    Period  Weeks    Status  New    Target Date  08/23/19      PT LONG TERM GOAL #4   Title  Pt will show 4/5 strength or more in shoulder flexion and abduction to maximize strength.    Time  6    Period  Weeks    Status  New    Target Date  08/23/19      PT LONG TERM GOAL #5   Title  Pt will understand posture and alignment, how it can affect neck and shoulder pain    Time  6    Period  Weeks    Status  New    Target Date  08/23/19            Plan -  07/12/19 0916    Clinical Impression Statement  Patient presents for low complexity eval of L sided shoulder pain consistent with impingement.  Pain does radiate into have so have not completely ruled out cervical involvement.  HEr PROM was near full with pain end ragne.  Strength is good in ER/IR but not flex/abd.  Posture is poor in sitting, showing scapular abduction and forward head, IR of humeral head.  Given HEP and she will benefit from PT to improve posture, shoulder mobility and functional strength.    Personal Factors and Comorbidities  Age;Comorbidity 1    Comorbidities  diabetes, back pain,  headaches, knee pain, CAD, HTN    Examination-Activity Limitations  Bathing;Caring for Others;Carry;Lift;Reach Overhead    Examination-Participation Restrictions  Church;Interpersonal Relationship;Laundry;Cleaning;Community Activity;Driving;Volunteer;Meal Prep    Stability/Clinical Decision Making  Stable/Uncomplicated    Clinical Decision Making  Low    Rehab Potential  Excellent    PT Frequency  2x / week    PT Duration  6 weeks    PT Treatment/Interventions  ADLs/Self Care Home Management;Electrical Stimulation;Cryotherapy;Iontophoresis 4mg /ml Dexamethasone;Moist Heat;Ultrasound;Therapeutic exercise;Therapeutic activities;Functional mobility training;Patient/family education;Manual techniques;Taping;Dry needling;Neuromuscular re-education    PT Next Visit Plan  check HEP, postural exercises, arm bike.  Check C spine ([place], ULTT)    PT Home Exercise Plan  corner stretch, scap squeeze, supine AAROM flexion and ERyellow band    Consulted and Agree with Plan of Care  Patient       Patient will benefit from skilled therapeutic intervention in order to improve the following deficits and impairments:  Decreased mobility, Improper body mechanics, Pain, Postural dysfunction, Impaired UE functional use, Impaired flexibility, Increased fascial restricitons, Decreased strength, Decreased range of motion  Visit Diagnosis: Abnormal posture  Acute pain of left shoulder  Muscle weakness (generalized)     Problem List Patient Active Problem List   Diagnosis Date Noted  . 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 Claudication ([place]) 04/19/2019  . Morbid obesity ([place]) 04/19/2019  . Suspected COVID-19 virus infection 04/13/2019  . Upper airway cough syndrome 08/22/2018  . Greater trochanteric bursitis of both hips 06/15/2018  . Trigger point of shoulder region, left 02/07/2018  . Hypertension   . Mild pulmonary hypertension ([place])   . NSVT (nonsustained  ventricular tachycardia) ([place])   . PVC's (premature ventricular contractions)   . URI (upper respiratory infection) 08/09/2016  . Neck pain 02/10/2016  . Urinary incontinence 02/10/2016  . Degenerative arthritis of knee, bilateral 07/17/2015  . Knee MCL sprain 06/07/2015  . Discomfort in chest 02/15/2015  . Chest pain 02/15/2015  . DM type 2, controlled, with complication ([place])   . Wellness examination 08/06/2014  . Acute meniscal tear of knee 02/13/2014  . Primary localized osteoarthrosis, lower leg 02/13/2014  . Gastrocnemius tear 12/25/2013  . UTI (urinary tract infection) 12/20/2013  . Right knee pain 12/20/2013  . Pulmonary hypertension ([place]) 10/06/2013  . Other forms of dyspnea 08/04/2013  . Dysphagia, unspecified(787.20) 08/10/2012  . Hip pain, left 02/02/2012  . Painful respiration 05/25/2011  . Encounter for long-term (current) use of other medications 01/30/2011  . PELVIC PAIN, LEFT 07/30/2010  . TOBACCO USE, QUIT 07/07/2010  . FATIGUE 12/20/2009  . Headache 12/20/2009  . Low back pain 12/26/2008  . Disorder of liver 04/13/2008  . FOOT PAIN, BILATERAL 04/13/2008  . FIBROIDS, UTERUS 11/24/2007  . THYROID NODULE, LEFT 11/24/2007  . HYPERCHOLESTEROLEMIA 11/24/2007  . CARPAL TUNNEL SYNDROME, BILATERAL 11/24/2007  . ALKALINE PHOSPHATASE, ELEVATED 11/24/2007  . Gout  08/10/2007  . ANXIETY 08/10/2007  . Essential hypertension 08/10/2007  . Cough, persistent 08/01/2007  . Perennial and seasonal allergic rhinitis 01/14/2007  . GERD 01/14/2007  . Osteoporosis 01/14/2007    Amy Escobar 07/13/2019, 2:37 PM  [facility] [address] [place], Amy Escobar, Amy Escobar Phone: [phone]   Fax:  [phone]  Name: Amy Escobar MRN: [id number] Date of Birth: [birth year]  Raeford Razor, PT 07/13/19 2:37 PM Phone: [phone] Fax: [phone]

## 2019-07-14 ENCOUNTER — Encounter: Payer: Self-pay | Admitting: Family

## 2019-07-14 ENCOUNTER — Other Ambulatory Visit: Payer: Self-pay

## 2019-07-14 ENCOUNTER — Encounter: Payer: Self-pay | Admitting: Gastroenterology

## 2019-07-14 ENCOUNTER — Ambulatory Visit (INDEPENDENT_AMBULATORY_CARE_PROVIDER_SITE_OTHER): Payer: Medicare Other | Admitting: Family

## 2019-07-14 VITALS — BP 108/74 | HR 100 | Temp 98.3°F | Ht 64.5 in | Wt 153.0 lb

## 2019-07-14 DIAGNOSIS — E118 Type 2 diabetes mellitus with unspecified complications: Secondary | ICD-10-CM

## 2019-07-14 DIAGNOSIS — I10 Essential (primary) hypertension: Secondary | ICD-10-CM

## 2019-07-14 DIAGNOSIS — K219 Gastro-esophageal reflux disease without esophagitis: Secondary | ICD-10-CM

## 2019-07-14 DIAGNOSIS — Z0000 Encounter for general adult medical examination without abnormal findings: Secondary | ICD-10-CM

## 2019-07-14 NOTE — Progress Notes (Signed)
Amy Escobar is a 74 y.o. female with the following history as recorded in EpicCare:  Patient Active Problem List   Diagnosis Date Noted  . 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 Claudication ([place]) 04/19/2019  . Morbid obesity ([place]) 04/19/2019  . Suspected COVID-19 virus infection 04/13/2019  . Upper airway cough syndrome 08/22/2018  . Greater trochanteric bursitis of both hips 06/15/2018  . Trigger point of shoulder region, left 02/07/2018  . Hypertension   . Mild pulmonary hypertension ([place])   . NSVT (nonsustained ventricular tachycardia) ([place])   . PVC's (premature ventricular contractions)   . URI (upper respiratory infection) 08/09/2016  . Neck pain 02/10/2016  . Urinary incontinence 02/10/2016  . Degenerative arthritis of knee, bilateral 07/17/2015  . Knee MCL sprain 06/07/2015  . Discomfort in chest 02/15/2015  . Chest pain 02/15/2015  . DM type 2, controlled, with complication ([place])   . Wellness examination 08/06/2014  . Acute meniscal tear of knee 02/13/2014  . Primary localized osteoarthrosis, lower leg 02/13/2014  . Gastrocnemius tear 12/25/2013  . UTI (urinary tract infection) 12/20/2013  . Right knee pain 12/20/2013  . Pulmonary hypertension ([place]) 10/06/2013  . Other forms of dyspnea 08/04/2013  . Dysphagia, unspecified(787.20) 08/10/2012  . Hip pain, left 02/02/2012  . Painful respiration 05/25/2011  . Encounter for long-term (current) use of other medications 01/30/2011  . PELVIC PAIN, LEFT 07/30/2010  . TOBACCO USE, QUIT 07/07/2010  . FATIGUE 12/20/2009  . Headache 12/20/2009  . Low back pain 12/26/2008  . Disorder of liver 04/13/2008  . FOOT PAIN, BILATERAL 04/13/2008  . FIBROIDS, UTERUS 11/24/2007  . THYROID NODULE, LEFT 11/24/2007  . HYPERCHOLESTEROLEMIA 11/24/2007  . CARPAL TUNNEL SYNDROME, BILATERAL 11/24/2007  . ALKALINE PHOSPHATASE, ELEVATED 11/24/2007  . Gout 08/10/2007  . ANXIETY 08/10/2007  . Essential  hypertension 08/10/2007  . Cough, persistent 08/01/2007  . Perennial and seasonal allergic rhinitis 01/14/2007  . GERD 01/14/2007  . Osteoporosis 01/14/2007    Current Outpatient Medications  Medication Sig Dispense Refill  . amLODipine (NORVASC) 2.5 MG tablet TAKE ONE TABLET BY MOUTH DAILY 90 tablet 0  . aspirin EC 81 MG tablet Take 81 mg by mouth daily.    . Azelastine-Fluticasone 137-50 MCG/ACT SUSP Place 1 spray into the nose 2 (two) times daily. 23 g 5  . benzonatate (TESSALON) 200 MG capsule Take 1 capsule (200 mg total) by mouth 3 (three) times daily as needed for cough. 45 capsule 2  . Carbinoxamine Maleate 4 MG TABS Take 1 tablet (4 mg total) by mouth every 8 (eight) hours as needed. 56 tablet 5  . Chlorphen-Pseudoephed-APAP (CORICIDIN D PO) Take by mouth as needed.     . chlorpheniramine (CHLOR-TRIMETON) 4 MG tablet Take 4 mg by mouth every 4 (four) hours as needed for allergies.    Marland Kitchen esomeprazole (NEXIUM) 40 MG capsule TAKE ONE CAPSULE BY MOUTH 30 TO 60 MINUTES BEFORE YOUR FIRST AND LAST MEALS OF THE DAY 180 capsule 1  . fesoterodine (TOVIAZ) 4 MG TB24 tablet Take 4 mg daily by mouth.    . gabapentin (NEURONTIN) 600 MG tablet Take 600 mg by mouth 3 (three) times daily.    Marland Kitchen glucose blood (ONETOUCH VERIO) test strip 1 each by Other route daily. 100 each 12  . hydrocortisone 2.5 % lotion     . metFORMIN (GLUCOPHAGE-XR) 500 MG 24 hr tablet TAKE TWO TABLETS BY MOUTH TWICE A DAY 360 tablet 0  . montelukast (SINGULAIR) 10 MG tablet Take 1  tablet (10 mg total) by mouth at bedtime. 30 tablet 11  . Respiratory Therapy Supplies MISC Use flutter valve as needed to break the coughing cycle. 1 each 1  . rosuvastatin (CRESTOR) 5 MG tablet TAKE ONE TABLET BY MOUTH DAILY 90 tablet 3  . triamcinolone cream (KENALOG) 0.1 % APPLY 1 APPLICATION TOPICALLY 4 (FOUR) TIMES DAILY AS NEEDED FOR RASH 45 g 1  . UNKNOWN TO PATIENT Inject as directed every 6 (six) months. Injection for arthritis in right and  left knees    . valsartan (DIOVAN) 80 MG tablet TAKE ONE TABLET BY MOUTH DAILY 90 tablet 3  . Vitamin D, Ergocalciferol, (DRISDOL) 1.25 MG (50000 UT) CAPS capsule TAKE ONE CAPSULE BY MOUTH ONCE WEEKLY 12 capsule 0   No current facility-administered medications for this visit.    Allergies: Olmesartan medoxomil  Past Medical History:  Diagnosis Date  . Alkaline phosphatase deficiency    w/u Ne  . Allergic rhinitis   . Anxiety   . Arthritis   . DM2 (diabetes mellitus, type 2) ([place])   . GERD (gastroesophageal reflux disease)   . Gout   . Hemorrhoids   . Hyperlipidemia   . Hypertension   . Mild pulmonary hypertension ([place])   . NSVT (nonsustained ventricular tachycardia) ([place])   . Osteoporosis   . PVC's (premature ventricular contractions)   . Thyroid nodule    small  . Urticaria   . Uterine fibroid     Past Surgical History:  Procedure Laterality Date  . CATARACT EXTRACTION Bilateral 2016  . COLONOSCOPY  2007  . echocardiogram (other)  01/16/2002  . 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 stress cardiolite  02/12/2006  . TOENAIL EXCISION    . TUBAL LIGATION    . TYMPANOSTOMY TUBE PLACEMENT      Family History  Problem Relation Age of Onset  . Heart disease Mother   . Allergic rhinitis Mother   . Heart disease Father   . Heart disease Maternal Grandfather   . Rectal cancer Maternal Grandfather   . Stomach cancer Maternal Grandmother   . Colon cancer Neg Hx   . Breast cancer Neg Hx     Social History   Tobacco Use  . Smoking status: Former Smoker    Packs/day: 0.25    Years: 5.00    Pack years: 1.25    Types: Cigarettes    Quit date: 06/29/1968    Years since quitting: 51.0  . Smokeless tobacco: Never Used  Substance Use Topics  . Alcohol use: No    Alcohol/week: 0.0 standard drinks    Subjective:   Here for medicare wellness, no new complaints. Please see A/P for status and treatment of chronic medical problems.   Diet: heart healthy or DM if  diabetic Physical activity: sedentary Depression/mood screen: negative Hearing: intact to whispered voice Visual acuity: grossly normal, performs annual eye exam  ADLs: capable Fall risk: none Home safety: good Cognitive evaluation: intact to orientation, naming, recall and repetition EOL planning: adv directives discussed    Office Visit from 03/31/2019 in [facility]  PHQ-2 Total Score  0        Office Visit from 03/31/2019 in [facility]  PHQ-9 Total Score  0      I have personally reviewed and have noted 1. The patient's medical and social history - reviewed today no changes 2. Their use of alcohol, tobacco or illicit drugs 3. Their current medications  and supplements 4. The patient's functional ability including ADL's, fall risks, home safety risks and hearing or visual impairment. 5. Diet and physical activities 6. Evidence for depression or mood disorders 7. Care team reviewed and updated  Patient Care Team: [name], Ramona as PCP - General (Internal Medicine) Dorothy Spark, MD as PCP - Cardiology (Cardiology) Unice Bailey, MD (Internal Medicine) Earnstine Regal, PA-C as Physician Assistant (Obstetrics and Gynecology) Belva Crome, MD as Consulting Physician (Cardiology) Inda Castle, MD (Inactive) as Consulting Physician (Gastroenterology) Zadie Rhine Clent Demark, MD as Consulting Physician (Ophthalmology) Tanda Rockers, MD as Consulting Physician (Pulmonary Disease) Lyndal Pulley, DO as Attending Physician (Family Medicine)    Past Medical History:  Diagnosis Date  . Alkaline phosphatase deficiency    w/u Ne  . Allergic rhinitis   . Anxiety   . Arthritis   . DM2 (diabetes mellitus, type 2) ([place])   . GERD (gastroesophageal reflux disease)   . Gout   . Hemorrhoids   . Hyperlipidemia   . Hypertension   . Mild pulmonary hypertension ([place])   . NSVT (nonsustained ventricular tachycardia)  ([place])   . Osteoporosis   . PVC's (premature ventricular contractions)   . Thyroid nodule    small  . Urticaria   . Uterine fibroid    Past Surgical History:  Procedure Laterality Date  . CATARACT EXTRACTION Bilateral 2016  . COLONOSCOPY  2007  . echocardiogram (other)  01/16/2002  . 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 stress cardiolite  02/12/2006  . TOENAIL EXCISION    . TUBAL LIGATION    . TYMPANOSTOMY TUBE PLACEMENT     Family History  Problem Relation Age of Onset  . Heart disease Mother   . Allergic rhinitis Mother   . Heart disease Father   . Heart disease Maternal Grandfather   . Rectal cancer Maternal Grandfather   . Stomach cancer Maternal Grandmother   . Colon cancer Neg Hx   . Breast cancer Neg Hx     Objective:  Vitals:   07/14/19 1043  BP: 108/74  Pulse: 100  Temp: 98.3 F (36.8 C)  TempSrc: Oral  SpO2: 99%  Weight: 153 lb (69.4 kg)  Height: 5' 4.5" (1.638 m)    General: Well developed, well nourished, in no acute distress  Skin : Warm and dry.  Head: Normocephalic and atraumatic  Eyes: Sclera and conjunctiva clear; pupils round and reactive to light; extraocular movements intact  Ears: External normal; canals clear; tympanic membranes normal  Oropharynx: Pink, supple. No suspicious lesions  Neck: Supple without thyromegaly, adenopathy  Lungs: Respirations unlabored; clear to auscultation bilaterally without wheeze, rales, rhonchi  CVS exam: normal rate and regular rhythm.  Neurologic: Alert and oriented; speech intact; face symmetrical; moves all extremities well; CNII-XII intact without focal deficit   Assessment:  1. Encounter for annual wellness visit (AWV) in Medicare patient   2. Essential hypertension   3. DM type 2, controlled, with complication ([place])   4. Gastroesophageal reflux disease without esophagitis     Plan:  Age appropriate preventive healthcare needs addressed; encouraged regular eye doctor and dental exams; encouraged  regular exercise; will update labs and refills as needed today; follow-up to be determined; Continue with her specialists as scheduled; congratulated patient on her commitment to her health; Will reach out to urology per patient request; Did encourage patient to get her COVID vaccine- risks/ benefits discussed;   No follow-ups on file.  No orders  of the defined types were placed in this encounter.   Requested Prescriptions    No prescriptions requested or ordered in this encounter

## 2019-07-14 NOTE — Addendum Note (Signed)
Addended by: Sherlene Shams on: 07/14/2019 12:48 PM   Modules accepted: Level of Service

## 2019-07-17 ENCOUNTER — Other Ambulatory Visit: Payer: Self-pay | Admitting: Cardiology

## 2019-07-17 DIAGNOSIS — I10 Essential (primary) hypertension: Secondary | ICD-10-CM

## 2019-07-17 DIAGNOSIS — R0609 Other forms of dyspnea: Secondary | ICD-10-CM

## 2019-07-17 DIAGNOSIS — E782 Mixed hyperlipidemia: Secondary | ICD-10-CM

## 2019-07-18 ENCOUNTER — Encounter: Payer: Self-pay | Admitting: Physical Therapy

## 2019-07-18 ENCOUNTER — Ambulatory Visit: Payer: Medicare Other | Admitting: Physical Therapy

## 2019-07-18 ENCOUNTER — Other Ambulatory Visit: Payer: Self-pay

## 2019-07-18 DIAGNOSIS — M6281 Muscle weakness (generalized): Secondary | ICD-10-CM

## 2019-07-18 DIAGNOSIS — M25512 Pain in left shoulder: Secondary | ICD-10-CM | POA: Diagnosis not present

## 2019-07-18 DIAGNOSIS — R293 Abnormal posture: Secondary | ICD-10-CM | POA: Diagnosis not present

## 2019-07-18 DIAGNOSIS — M62838 Other muscle spasm: Secondary | ICD-10-CM | POA: Diagnosis not present

## 2019-07-18 DIAGNOSIS — G8929 Other chronic pain: Secondary | ICD-10-CM | POA: Diagnosis not present

## 2019-07-18 DIAGNOSIS — M545 Low back pain: Secondary | ICD-10-CM | POA: Diagnosis not present

## 2019-07-18 NOTE — Therapy (Signed)
[place], [place], [address] Phone: [phone]   Fax:  [phone]  Physical Therapy Treatment  Patient Details  Name: Amy Escobar MRN: [id number] Date of Birth: April 06, 1946 Referring Provider (PT): Dr. Lynne Leader    Encounter Date: 07/18/2019  PT End of Session - 07/18/19 0719    Visit Number  2    Number of Visits  12    Date for PT Re-Evaluation  08/23/19    PT Start Time  0715    PT Stop Time  0803    PT Time Calculation (min)  48 min       Past Medical History:  Diagnosis Date  . Alkaline phosphatase deficiency    w/u Ne  . Allergic rhinitis   . Anxiety   . Arthritis   . DM2 (diabetes mellitus, type 2) ([place])   . GERD (gastroesophageal reflux disease)   . Gout   . Hemorrhoids   . Hyperlipidemia   . Hypertension   . Mild pulmonary hypertension ([place])   . NSVT (nonsustained ventricular tachycardia) ([place])   . Osteoporosis   . PVC's (premature ventricular contractions)   . Thyroid nodule    small  . Urticaria   . Uterine fibroid     Past Surgical History:  Procedure Laterality Date  . CATARACT EXTRACTION Bilateral 2016  . COLONOSCOPY  2007  . echocardiogram (other)  01/16/2002  . 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 stress cardiolite  02/12/2006  . TOENAIL EXCISION    . TUBAL LIGATION    . TYMPANOSTOMY TUBE PLACEMENT      There were no vitals filed for this visit.  Subjective Assessment - 07/18/19 0717    Subjective  Pt reports soreness in shoulder still maybe from the injection she received last week.    Currently in Pain?  Yes    Pain Score  7     Pain Location  Shoulder    Pain Orientation  Left    Pain Descriptors / Indicators  Sore    Pain Type  Acute pain    Aggravating Factors   lifting, reaching, carrying    Pain Relieving Factors  rest, tylenol                       [facility] Adult PT Treatment/Exercise - 07/18/19 0001      Exercises    Exercises  Shoulder      Shoulder Exercises: Supine   External Rotation  15 reps    Theraband Level (Shoulder External Rotation)  Level 1 (Yellow)    Other Supine Exercises  chest press, pullovers ,horizontal abduction/adduction,       Shoulder Exercises: Seated   Other Seated Exercises  retraction x 10       Shoulder Exercises: Standing   Row  20 reps    Theraband Level (Shoulder Row)  Level 2 (Red)      Shoulder Exercises: Pulleys   Flexion  2 minutes      Shoulder Exercises: ROM/Strengthening   UBE (Upper Arm Bike)  L1 forward 2 min, backward x 2 min       Shoulder Exercises: Stretch   Corner Stretch Limitations  doorway x 3       Modalities   Modalities  Moist Heat      Moist Heat Therapy   Number Minutes Moist Heat  10 Minutes    Moist Heat Location  Shoulder  Manual Therapy   Manual Therapy  Passive ROM    Passive ROM  Left shoulder all planes              PT Education - 07/18/19 0758    Education Details  HEP    Person(s) Educated  Patient    Methods  Explanation;Handout    Comprehension  Verbalized understanding          PT Long Term Goals - 07/13/19 1320      PT LONG TERM GOAL #1   Title  Pt will be able to show I with HEP upon last visit for posture and L UE    Time  6    Period  Weeks    Status  New    Target Date  08/23/19      PT LONG TERM GOAL #2   Title  Pt will be able to score no more than 32% impaired on FOTO to demo functional improvement    Time  6    Period  Weeks    Status  New    Target Date  08/23/19      PT LONG TERM GOAL #3   Title  Pt will be able to drive and cook using her non dominant (L) hand with no increase in pain.    Time  6    Period  Weeks    Status  New    Target Date  08/23/19      PT LONG TERM GOAL #4   Title  Pt will show 4/5 strength or more in shoulder flexion and abduction to maximize strength.    Time  6    Period  Weeks    Status  New    Target Date  08/23/19      PT LONG TERM GOAL #5    Title  Pt will understand posture and alignment, how it can affect neck and shoulder pain    Time  6    Period  Weeks    Status  New    Target Date  08/23/19            Plan - 07/18/19 0817    Clinical Impression Statement  Pt reports lingering soreness after shoulder injection. Reviewed HEP and progressed with scapular bands and UBE. HMP at end of session. yellow band ER is painful so did not progress.    PT Next Visit Plan  check HEP, postural exercises, arm bike.  Check C spine ([place], ULTT)    PT Home Exercise Plan  corner stretch, scap squeeze, supine AAROM flexion and ERyellow band, red band row       Patient will benefit from skilled therapeutic intervention in order to improve the following deficits and impairments:  Decreased mobility, Improper body mechanics, Pain, Postural dysfunction, Impaired UE functional use, Impaired flexibility, Increased fascial restricitons, Decreased strength, Decreased range of motion  Visit Diagnosis: Abnormal posture  Acute pain of left shoulder  Muscle weakness (generalized)  Other muscle spasm  Chronic bilateral low back pain without sciatica     Problem List Patient Active Problem List   Diagnosis Date Noted  . 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 Claudication ([place]) 04/19/2019  . Morbid obesity ([place]) 04/19/2019  . Suspected COVID-19 virus infection 04/13/2019  . Upper airway cough syndrome 08/22/2018  . Greater trochanteric bursitis of both hips 06/15/2018  . Trigger point of shoulder region, left 02/07/2018  . Hypertension   . Mild pulmonary hypertension (  [place])   . NSVT (nonsustained ventricular tachycardia) ([place])   . PVC's (premature ventricular contractions)   . URI (upper respiratory infection) 08/09/2016  . Neck pain 02/10/2016  . Urinary incontinence 02/10/2016  . Degenerative arthritis of knee, bilateral 07/17/2015  . Knee MCL sprain 06/07/2015  . Discomfort in chest  02/15/2015  . Chest pain 02/15/2015  . DM type 2, controlled, with complication ([place])   . Wellness examination 08/06/2014  . Acute meniscal tear of knee 02/13/2014  . Primary localized osteoarthrosis, lower leg 02/13/2014  . Gastrocnemius tear 12/25/2013  . UTI (urinary tract infection) 12/20/2013  . Right knee pain 12/20/2013  . Pulmonary hypertension ([place]) 10/06/2013  . Other forms of dyspnea 08/04/2013  . Dysphagia, unspecified(787.20) 08/10/2012  . Hip pain, left 02/02/2012  . Painful respiration 05/25/2011  . Encounter for long-term (current) use of other medications 01/30/2011  . PELVIC PAIN, LEFT 07/30/2010  . TOBACCO USE, QUIT 07/07/2010  . FATIGUE 12/20/2009  . Headache 12/20/2009  . Low back pain 12/26/2008  . Disorder of liver 04/13/2008  . FOOT PAIN, BILATERAL 04/13/2008  . FIBROIDS, UTERUS 11/24/2007  . THYROID NODULE, LEFT 11/24/2007  . HYPERCHOLESTEROLEMIA 11/24/2007  . CARPAL TUNNEL SYNDROME, BILATERAL 11/24/2007  . ALKALINE PHOSPHATASE, ELEVATED 11/24/2007  . Gout 08/10/2007  . ANXIETY 08/10/2007  . Essential hypertension 08/10/2007  . Cough, persistent 08/01/2007  . Perennial and seasonal allergic rhinitis 01/14/2007  . GERD 01/14/2007  . Osteoporosis 01/14/2007    Dorene Ar, PTA 07/18/2019, 8:20 AM  [facility] [address] [place], [place], [address] Phone: [phone]   Fax:  [phone]  Name: Kathyria Essien MRN: [id number] Date of Birth: 1945-12-29

## 2019-07-19 ENCOUNTER — Encounter: Payer: Self-pay | Admitting: Family Medicine

## 2019-07-19 ENCOUNTER — Ambulatory Visit (INDEPENDENT_AMBULATORY_CARE_PROVIDER_SITE_OTHER): Payer: Medicare Other | Admitting: Family Medicine

## 2019-07-19 ENCOUNTER — Ambulatory Visit: Payer: Medicare Other | Admitting: Family Medicine

## 2019-07-19 DIAGNOSIS — M170 Bilateral primary osteoarthritis of knee: Secondary | ICD-10-CM

## 2019-07-19 NOTE — Progress Notes (Signed)
[place] Phone: [phone] Subjective:   Amy Escobar, am serving as a scribe for Dr. Hulan Saas. This visit occurred during the SARS-CoV-2 public health emergency.  Safety protocols were in place, including screening questions prior to the visit, additional usage of staff PPE, and extensive cleaning of exam room while observing appropriate contact time as indicated for disinfecting solutions.   I'm seeing this patient by the request  of:  [name], FNP  CC:  Bilateral knee pain  [id number]  Amy Escobar is a 74 y.o. female coming in with complaint of bilateral knee pain. Is here for injections to help with her knee pain.  Known arthritis.  Patient has had significant difficulty recently.  Feels the colder weather has become more difficult.      Past Medical History:  Diagnosis Date  . Alkaline phosphatase deficiency    w/u Ne  . Allergic rhinitis   . Anxiety   . Arthritis   . DM2 (diabetes mellitus, type 2) ([employer])   . GERD (gastroesophageal reflux disease)   . Gout   . Hemorrhoids   . Hyperlipidemia   . Hypertension   . Mild pulmonary hypertension ([place])   . NSVT (nonsustained ventricular tachycardia) ([place])   . Osteoporosis   . PVC's (premature ventricular contractions)   . Thyroid nodule    small  . Urticaria   . Uterine fibroid    Past Surgical History:  Procedure Laterality Date  . CATARACT EXTRACTION Bilateral 2016  . COLONOSCOPY  2007  . echocardiogram (other)  01/16/2002  . 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 stress cardiolite  02/12/2006  . TOENAIL EXCISION    . TUBAL LIGATION    . TYMPANOSTOMY TUBE PLACEMENT     Social History   Socioeconomic History  . Marital status: Widowed    Spouse name: Not on file  . Number of children: 2  . Years of education: Not on file  . Highest education level: Not on file  Occupational History  . Occupation: Lexicographer: UNEMPLOYED  Tobacco Use  . Smoking status: Former Smoker    Packs/day: 0.25    Years: 5.00    Pack years: 1.25    Types: Cigarettes    Quit date: 06/29/1968    Years since quitting: 51.0  . Smokeless tobacco: Never Used  Substance and Sexual Activity  . Alcohol use: Escobar    Alcohol/week: 0.0 standard drinks  . Drug use: Escobar  . Sexual activity: Not on file  Other Topics Concern  . Not on file  Social History Narrative   Widowed 2010.    Social Determinants of Health   Financial Resource Strain:   . Difficulty of Paying Living Expenses: Not on file  Food Insecurity:   . Worried About Charity fundraiser in the Last Year: Not on file  . Ran Out of Food in the Last Year: Not on file  Transportation Needs:   . Lack of Transportation (Medical): Not on file  . Lack of Transportation (Non-Medical): Not on file  Physical Activity:   . Days of Exercise per Week: Not on file  . Minutes of Exercise per Session: Not on file  Stress:   . Feeling of Stress : Not on file  Social Connections:   . Frequency of Communication with Friends and Family: Not on file  . Frequency of Social Gatherings with  Friends and Family: Not on file  . Attends Religious Services: Not on file  . Active Member of Clubs or Organizations: Not on file  . Attends Archivist Meetings: Not on file  . Marital Status: Not on file   Allergies  Allergen Reactions  . Olmesartan Medoxomil     REACTION: headache   Family History  Problem Relation Age of Onset  . Heart disease Mother   . Allergic rhinitis Mother   . Heart disease Father   . Heart disease Maternal Grandfather   . Rectal cancer Maternal Grandfather   . Stomach cancer Maternal Grandmother   . Colon cancer Neg Hx   . Breast cancer Neg Hx     Current Outpatient Medications (Endocrine & Metabolic):  .  metFORMIN (GLUCOPHAGE-XR) 500 MG 24 hr tablet, TAKE TWO TABLETS BY MOUTH TWICE A DAY  Current Outpatient Medications  (Cardiovascular):  .  amLODipine (NORVASC) 2.5 MG tablet, TAKE ONE TABLET BY MOUTH DAILY .  rosuvastatin (CRESTOR) 5 MG tablet, TAKE ONE TABLET BY MOUTH DAILY .  valsartan (DIOVAN) 80 MG tablet, TAKE ONE TABLET BY MOUTH DAILY  Current Outpatient Medications (Respiratory):  Marland Kitchen  Azelastine-Fluticasone 137-50 MCG/ACT SUSP, Place 1 spray into the nose 2 (two) times daily. .  benzonatate (TESSALON) 200 MG capsule, Take 1 capsule (200 mg total) by mouth 3 (three) times daily as needed for cough. .  Carbinoxamine Maleate 4 MG TABS, Take 1 tablet (4 mg total) by mouth every 8 (eight) hours as needed. .  Chlorphen-Pseudoephed-APAP (CORICIDIN D PO), Take by mouth as needed.  .  chlorpheniramine (CHLOR-TRIMETON) 4 MG tablet, Take 4 mg by mouth every 4 (four) hours as needed for allergies. .  montelukast (SINGULAIR) 10 MG tablet, Take 1 tablet (10 mg total) by mouth at bedtime.  Current Outpatient Medications (Analgesics):  .  aspirin EC 81 MG tablet, Take 81 mg by mouth daily.   Current Outpatient Medications (Other):  .  esomeprazole (NEXIUM) 40 MG capsule, TAKE ONE CAPSULE BY MOUTH 30 TO 60 MINUTES BEFORE YOUR FIRST AND LAST MEALS OF THE DAY .  fesoterodine (TOVIAZ) 4 MG TB24 tablet, Take 4 mg daily by mouth. .  gabapentin (NEURONTIN) 600 MG tablet, Take 600 mg by mouth 3 (three) times daily. Marland Kitchen  glucose blood (ONETOUCH VERIO) test strip, 1 each by Other route daily. .  hydrocortisone 2.5 % lotion,  .  Respiratory Therapy Supplies MISC, Use flutter valve as needed to break the coughing cycle. .  triamcinolone cream (KENALOG) 0.1 %, APPLY 1 APPLICATION TOPICALLY 4 (FOUR) TIMES DAILY AS NEEDED FOR RASH .  UNKNOWN TO PATIENT, Inject as directed every 6 (six) months. 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  Vitamin D, Ergocalciferol, (DRISDOL) 1.25 MG (50000 UT) CAPS capsule, TAKE ONE CAPSULE BY MOUTH ONCE WEEKLY    Past medical history, social, surgical and family history all reviewed in  electronic medical record.  Escobar pertanent information unless stated regarding to the chief complaint.   Review of Systems:  Escobar headache, visual changes, nausea, vomiting, diarrhea, constipation, dizziness, abdominal pain, skin rash, fevers, chills, night sweats, weight loss, swollen lymph nodes, body aches, joint swelling, chest pain, shortness of breath, mood changes. POSITIVE muscle aches  Objective  Blood pressure 110/82, pulse 95, height 5' 4.5" (1.638 m), weight 158 lb (71.7 kg), SpO2 95 %.   General: Escobar apparent distress alert and oriented x3 mood and affect normal, dressed appropriately.  HEENT: Pupils equal, extraocular movements intact  Respiratory: Patient's speak in full sentences and does not appear short of breath  Cardiovascular: Escobar lower extremity edema, non tender, Escobar erythema  Skin: Warm dry intact with Escobar signs of infection or rash on extremities or on axial skeleton.  Abdomen: Soft nontender  Neuro: Cranial nerves II through XII are intact, neurovascularly intact in all extremities with 2+ DTRs and 2+ pulses.  Lymph: Escobar lymphadenopathy of posterior or anterior cervical chain or axillae bilaterally.  Gait antalgic MSK:  Knee: Bilateral valgus deformity noted. Large thigh to calf ratio.  Tender to palpation over medial and PF joint line.  ROM full in flexion and extension and lower leg rotation. instability with valgus force.  painful patellar compression. Patellar glide with moderate crepitus. Patellar and quadriceps tendons unremarkable. Hamstring and quadriceps strength is normal.   After informed written and verbal consent, patient was seated on exam table. Right knee was prepped with alcohol swab and utilizing anterolateral approach, patient's right knee space was injected with 4:1  marcaine 0.5%: Kenalog 40mg /dL. Patient tolerated the procedure well without immediate complications.  After informed written and verbal consent, patient was seated on exam table. Left  knee was prepped with alcohol swab and utilizing anterolateral approach, patient's left knee space was injected with 4:1  marcaine 0.5%: Kenalog 40mg /dL. Patient tolerated the procedure well without immediate complications.      Impression and Recommendations:     This case required medical decision making of moderate complexity. The above documentation has been reviewed and is accurate and complete Lyndal Pulley, DO       Note: This dictation was prepared with Dragon dictation along with smaller phrase technology. Any transcriptional errors that result from this process are unintentional.

## 2019-07-19 NOTE — Patient Instructions (Addendum)
Injected both knees today Look for email for vaccine day/time sign up See me again in 10 weeks Zinc 30-50 mg daily

## 2019-07-19 NOTE — Assessment & Plan Note (Signed)
Bilateral injection given today.  Discussed icing regimen and home exercise, discussed which activities to do which wants to avoid.  Patient to increase activity as tolerated.  Follow-up again in 10 weeks

## 2019-07-20 ENCOUNTER — Ambulatory Visit: Payer: Medicare Other | Admitting: Physical Therapy

## 2019-07-26 ENCOUNTER — Ambulatory Visit: Payer: Medicare Other | Admitting: Physical Therapy

## 2019-07-28 ENCOUNTER — Ambulatory Visit: Payer: Medicare Other | Admitting: Physical Therapy

## 2019-07-31 ENCOUNTER — Other Ambulatory Visit: Payer: Self-pay | Admitting: Family

## 2019-07-31 ENCOUNTER — Ambulatory Visit: Payer: Medicare Other | Admitting: Physical Therapy

## 2019-07-31 DIAGNOSIS — Z1231 Encounter for screening mammogram for malignant neoplasm of breast: Secondary | ICD-10-CM

## 2019-08-02 ENCOUNTER — Other Ambulatory Visit: Payer: Self-pay

## 2019-08-02 ENCOUNTER — Ambulatory Visit (INDEPENDENT_AMBULATORY_CARE_PROVIDER_SITE_OTHER): Payer: Medicare Other | Admitting: Family Medicine

## 2019-08-02 ENCOUNTER — Encounter: Payer: Self-pay | Admitting: Family Medicine

## 2019-08-02 ENCOUNTER — Ambulatory Visit (INDEPENDENT_AMBULATORY_CARE_PROVIDER_SITE_OTHER): Payer: Medicare Other

## 2019-08-02 VITALS — BP 130/80 | HR 99 | Ht 64.5 in | Wt 159.0 lb

## 2019-08-02 DIAGNOSIS — G8929 Other chronic pain: Secondary | ICD-10-CM | POA: Diagnosis not present

## 2019-08-02 DIAGNOSIS — M19019 Primary osteoarthritis, unspecified shoulder: Secondary | ICD-10-CM | POA: Insufficient documentation

## 2019-08-02 DIAGNOSIS — M25512 Pain in left shoulder: Secondary | ICD-10-CM

## 2019-08-02 DIAGNOSIS — M19012 Primary osteoarthritis, left shoulder: Secondary | ICD-10-CM | POA: Diagnosis not present

## 2019-08-02 NOTE — Progress Notes (Signed)
[facility] [address] [place] Phone: [phone] Subjective:   I [name] am serving as a Education administrator for Dr. Hulan Saas.  This visit occurred during the SARS-CoV-2 public health emergency.  Safety protocols were in place, including screening questions prior to the visit, additional usage of staff PPE, and extensive cleaning of exam room while observing appropriate contact time as indicated for disinfecting solutions.   I'm seeing this patient by the request  of:  [name], FNP  CC: Left shoulder pain  [id number]   07/19/2019 Bilateral injection given today.  Discussed icing regimen and home exercise, discussed which activities to do which wants to avoid.  Patient to increase activity as tolerated.  Follow-up again in 10 weeks  Update 08/02/2019 Steffanie Condor Veal is a 74 y.o. female coming in with complaint of bilateral knee pain and left shoulder pain. Patient states she saw another provider in office on 07/04/2019 for left shoulder pain. Patient states her shoulder is painful. Has been going to PT. patient is initially on different provider.  Patient did see improvement and had an injection.  States that it is approximately 60 to 65% better but continues to have some discomfort especially at night.      Past Medical History:  Diagnosis Date  . Alkaline phosphatase deficiency    w/u Ne  . Allergic rhinitis   . Anxiety   . Arthritis   . DM2 (diabetes mellitus, type 2) ([place])   . GERD (gastroesophageal reflux disease)   . Gout   . Hemorrhoids   . Hyperlipidemia   . Hypertension   . Mild pulmonary hypertension ([place])   . NSVT (nonsustained ventricular tachycardia) ([place])   . Osteoporosis   . PVC's (premature ventricular contractions)   . Thyroid nodule    small  . Urticaria   . Uterine fibroid    Past Surgical History:  Procedure Laterality Date  . CATARACT EXTRACTION Bilateral 2016  . COLONOSCOPY  2007  .  echocardiogram (other)  01/16/2002  . 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 stress cardiolite  02/12/2006  . TOENAIL EXCISION    . TUBAL LIGATION    . TYMPANOSTOMY TUBE PLACEMENT     Social History   Socioeconomic History  . Marital status: Widowed    Spouse name: Not on file  . Number of children: 2  . Years of education: Not on file  . Highest education level: Not on file  Occupational History  . Occupation: Surveyor, minerals: UNEMPLOYED  Tobacco Use  . Smoking status: Former Smoker    Packs/day: 0.25    Years: 5.00    Pack years: 1.25    Types: Cigarettes    Quit date: 06/29/1968    Years since quitting: 51.1  . Smokeless tobacco: Never Used  Substance and Sexual Activity  . Alcohol use: No    Alcohol/week: 0.0 standard drinks  . Drug use: No  . Sexual activity: Not on file  Other Topics Concern  . Not on file  Social History Narrative   Widowed 2010.    Social Determinants of Health   Financial Resource Strain:   . Difficulty of Paying Living Expenses: Not on file  Food Insecurity:   . Worried About Charity fundraiser in the Last Year: Not on file  . Ran Out of Food in the Last Year: Not on file  Transportation Needs:   . Lack of Transportation (  Medical): Not on file  . Lack of Transportation (Non-Medical): Not on file  Physical Activity:   . Days of Exercise per Week: Not on file  . Minutes of Exercise per Session: Not on file  Stress:   . Feeling of Stress : Not on file  Social Connections:   . Frequency of Communication with Friends and Family: Not on file  . Frequency of Social Gatherings with Friends and Family: Not on file  . Attends Religious Services: Not on file  . Active Member of Clubs or Organizations: Not on file  . Attends Archivist Meetings: Not on file  . Marital Status: Not on file   Allergies  Allergen Reactions  . Olmesartan Medoxomil     REACTION: headache   Family History  Problem Relation Age of Onset  .  Heart disease Mother   . Allergic rhinitis Mother   . Heart disease Father   . Heart disease Maternal Grandfather   . Rectal cancer Maternal Grandfather   . Stomach cancer Maternal Grandmother   . Colon cancer Neg Hx   . Breast cancer Neg Hx     Current Outpatient Medications (Endocrine & Metabolic):  .  metFORMIN (GLUCOPHAGE-XR) 500 MG 24 hr tablet, TAKE TWO TABLETS BY MOUTH TWICE A DAY  Current Outpatient Medications (Cardiovascular):  .  amLODipine (NORVASC) 2.5 MG tablet, TAKE ONE TABLET BY MOUTH DAILY .  rosuvastatin (CRESTOR) 5 MG tablet, TAKE ONE TABLET BY MOUTH DAILY .  valsartan (DIOVAN) 80 MG tablet, TAKE ONE TABLET BY MOUTH DAILY  Current Outpatient Medications (Respiratory):  Marland Kitchen  Azelastine-Fluticasone 137-50 MCG/ACT SUSP, Place 1 spray into the nose 2 (two) times daily. .  benzonatate (TESSALON) 200 MG capsule, Take 1 capsule (200 mg total) by mouth 3 (three) times daily as needed for cough. .  Carbinoxamine Maleate 4 MG TABS, Take 1 tablet (4 mg total) by mouth every 8 (eight) hours as needed. .  Chlorphen-Pseudoephed-APAP (CORICIDIN D PO), Take by mouth as needed.  .  chlorpheniramine (CHLOR-TRIMETON) 4 MG tablet, Take 4 mg by mouth every 4 (four) hours as needed for allergies. .  montelukast (SINGULAIR) 10 MG tablet, Take 1 tablet (10 mg total) by mouth at bedtime.  Current Outpatient Medications (Analgesics):  .  aspirin EC 81 MG tablet, Take 81 mg by mouth daily.   Current Outpatient Medications (Other):  .  esomeprazole (NEXIUM) 40 MG capsule, TAKE ONE CAPSULE BY MOUTH 30 TO 60 MINUTES BEFORE YOUR FIRST AND LAST MEALS OF THE DAY .  fesoterodine (TOVIAZ) 4 MG TB24 tablet, Take 4 mg daily by mouth. .  gabapentin (NEURONTIN) 600 MG tablet, Take 600 mg by mouth 3 (three) times daily. Marland Kitchen  glucose blood (ONETOUCH VERIO) test strip, 1 each by Other route daily. .  hydrocortisone 2.5 % lotion,  .  Respiratory Therapy Supplies MISC, Use flutter valve as needed to break the  coughing cycle. .  triamcinolone cream (KENALOG) 0.1 %, APPLY 1 APPLICATION TOPICALLY 4 (FOUR) TIMES DAILY AS NEEDED FOR RASH .  UNKNOWN TO PATIENT, Inject as directed every 6 (six) months. 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  Vitamin D, Ergocalciferol, (DRISDOL) 1.25 MG (50000 UT) CAPS capsule, TAKE ONE CAPSULE BY MOUTH ONCE WEEKLY   Reviewed prior external information including notes and imaging from  primary care provider As well as notes that were available from care everywhere and other healthcare systems.  Past medical history, social, surgical and family history all reviewed in electronic  medical record.  No pertanent information unless stated regarding to the chief complaint.   Review of Systems:  No headache, visual changes, nausea, vomiting, diarrhea, constipation, dizziness, abdominal pain, skin rash, fevers, chills, night sweats, weight loss, swollen lymph nodes,  joint swelling, chest pain, shortness of breath, mood changes. POSITIVE muscle aches, body aches  Objective  Blood pressure 130/80, pulse 99, height 5' 4.5" (1.638 m), weight 159 lb (72.1 kg), SpO2 98 %.   General: No apparent distress alert and oriented x3 mood and affect normal, dressed appropriately.  HEENT: Pupils equal, extraocular movements intact  Respiratory: Patient's speak in full sentences and does not appear short of breath  Cardiovascular: No lower extremity edema, non tender, no erythema  Skin: Warm dry intact with no signs of infection or rash on extremities or on axial skeleton.  Abdomen: Soft nontender  Neuro: Cranial nerves II through XII are intact, neurovascularly intact in all extremities with 2+ DTRs and 2+ pulses.  Lymph: No lymphadenopathy of posterior or anterior cervical chain or axillae bilaterally.  Gait antalgic MSK: Arthritic changes of multiple joints Left shoulder exam shows the patient does have 4-5 strength of the rotator cuff.  Patient does have positive impingement  but severe tenderness over the acromioclavicular joint with a positive crossover.  Neurovascular intact distally.  Some mild loss of lordosis of the cervical spine but negative Spurling's today.  Procedure: Real-time Ultrasound Guided Injection of left acromioclavicular joint Device: GE Logiq Q7 Ultrasound guided injection is preferred based studies that show increased duration, increased effect, greater accuracy, decreased procedural pain, increased response rate, and decreased cost with ultrasound guided versus blind injection.  Verbal informed consent obtained.  Time-out conducted.  Noted no overlying erythema, induration, or other signs of local infection.  Skin prepped in a sterile fashion.  Local anesthesia: Topical Ethyl chloride.  With sterile technique and under real time ultrasound guidance: With a 25-gauge half inch needle injected with 0.5 cc of 0.5% Marcaine and 0.5 cc of Kenalog 40 mg/mL into the acromioclavicular joint Completed without difficulty  Pain immediately resolved suggesting accurate placement of the medication.  Advised to call if fevers/chills, erythema, induration, drainage, or persistent bleeding.  Images permanently stored and available for review in the ultrasound unit.  Impression: Technically successful ultrasound guided injection.   Impression and Recommendations:     This case required medical decision making of moderate complexity. The above documentation has been reviewed and is accurate and complete Lyndal Pulley, DO       Note: This dictation was prepared with Dragon dictation along with smaller phrase technology. Any transcriptional errors that result from this process are unintentional.

## 2019-08-02 NOTE — Patient Instructions (Addendum)
Good to see you Keep doing exercise If you use posture brace start later in the day See me again in 6 weeks if not 100%

## 2019-08-02 NOTE — Assessment & Plan Note (Signed)
Injection given discussed HEP, discussed which activities to do. Discussed HEP

## 2019-08-03 ENCOUNTER — Other Ambulatory Visit: Payer: Self-pay

## 2019-08-03 ENCOUNTER — Ambulatory Visit: Payer: Medicare Other | Attending: Family Medicine | Admitting: Physical Therapy

## 2019-08-03 ENCOUNTER — Encounter: Payer: Self-pay | Admitting: Physical Therapy

## 2019-08-03 DIAGNOSIS — M62838 Other muscle spasm: Secondary | ICD-10-CM | POA: Insufficient documentation

## 2019-08-03 DIAGNOSIS — G8929 Other chronic pain: Secondary | ICD-10-CM | POA: Diagnosis not present

## 2019-08-03 DIAGNOSIS — M25512 Pain in left shoulder: Secondary | ICD-10-CM | POA: Diagnosis not present

## 2019-08-03 DIAGNOSIS — R293 Abnormal posture: Secondary | ICD-10-CM | POA: Diagnosis not present

## 2019-08-03 DIAGNOSIS — M6281 Muscle weakness (generalized): Secondary | ICD-10-CM | POA: Diagnosis not present

## 2019-08-03 DIAGNOSIS — M545 Low back pain: Secondary | ICD-10-CM | POA: Insufficient documentation

## 2019-08-03 NOTE — Therapy (Signed)
[facility] [place], [place], [address] Phone: [phone]   Fax:  [phone]  Physical Therapy Treatment  Patient Details  Name: Amy Escobar MRN: [id number] Date of Birth: Jun 02, 1946 Referring Provider (PT): Dr. Lynne Leader    Encounter Date: 08/03/2019  PT End of Session - 08/03/19 0733    Visit Number  3    Number of Visits  12    Date for PT Re-Evaluation  08/23/19    PT Start Time  0715    PT Stop Time  0803    PT Time Calculation (min)  48 min       Past Medical History:  Diagnosis Date  . Alkaline phosphatase deficiency    w/u Ne  . Allergic rhinitis   . Anxiety   . Arthritis   . DM2 (diabetes mellitus, type 2) ([place])   . GERD (gastroesophageal reflux disease)   . Gout   . Hemorrhoids   . Hyperlipidemia   . Hypertension   . Mild pulmonary hypertension ([place])   . NSVT (nonsustained ventricular tachycardia) ([place])   . Osteoporosis   . PVC's (premature ventricular contractions)   . Thyroid nodule    small  . Urticaria   . Uterine fibroid     Past Surgical History:  Procedure Laterality Date  . CATARACT EXTRACTION Bilateral 2016  . COLONOSCOPY  2007  . echocardiogram (other)  01/16/2002  . 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 stress cardiolite  02/12/2006  . TOENAIL EXCISION    . TUBAL LIGATION    . TYMPANOSTOMY TUBE PLACEMENT      There were no vitals filed for this visit.  Subjective Assessment - 08/03/19 0730    Subjective  Pt reports she recieved a gel injection in  her [facility] joint yesterday. MD recommends PT and HEP with F/U in 6 weeks.    Currently in Pain?  Yes    Pain Score  4     Pain Location  Shoulder    Pain Orientation  Left    Pain Descriptors / Indicators  Sore         [facility] PT Assessment - 08/03/19 0001      AROM   Left Shoulder Flexion  110 Degrees    Left Shoulder ABduction  110 Degrees    Left Shoulder Internal Rotation  --   FR to T12   Left Shoulder  External Rotation  --   FR to T2                  [facility] Adult PT Treatment/Exercise - 08/03/19 0001      Shoulder Exercises: Supine   Horizontal ABduction  15 reps    Theraband Level (Shoulder Horizontal ABduction)  Level 2 (Red)    Other Supine Exercises  scap squeeze x 10     Other Supine Exercises  chest press, pullovers ,horizontal abduction/adduction, ER/IR      Shoulder Exercises: Standing   External Rotation  12 reps    Theraband Level (Shoulder External Rotation)  Level 1 (Yellow)    Internal Rotation  12 reps    Theraband Level (Shoulder Internal Rotation)  Level 1 (Yellow)    Extension  20 reps    Theraband Level (Shoulder Extension)  Level 2 (Red)    Row  20 reps    Theraband Level (Shoulder Row)  Level 2 (Red)      Moist Heat Therapy   Number Minutes Moist  Heat  10 Minutes    Moist Heat Location  Shoulder                          PT Education - 08/03/19 0758    Education Details  HEP    Person(s) Educated  Patient    Methods  Explanation;Handout    Comprehension  Verbalized understanding          PT Long Term Goals - 07/13/19 1320      PT LONG TERM GOAL #1   Title  Pt will be able to show I with HEP upon last visit for posture and L UE    Time  6    Period  Weeks    Status  New    Target Date  08/23/19      PT LONG TERM GOAL #2   Title  Pt will be able to score no more than 32% impaired on FOTO to demo functional improvement    Time  6    Period  Weeks    Status  New    Target Date  08/23/19      PT LONG TERM GOAL #3   Title  Pt will be able to drive and cook using her non dominant (L) hand with no increase in pain.    Time  6    Period  Weeks    Status  New    Target Date  08/23/19      PT LONG TERM GOAL #4   Title  Pt will show 4/5 strength or more in shoulder flexion and abduction to maximize strength.    Time  6    Period  Weeks    Status  New    Target Date  08/23/19      PT LONG TERM GOAL #5   Title  Pt will  understand posture and alignment, how it can affect neck and shoulder pain    Time  6    Period  Weeks    Status  New    Target Date  08/23/19            Plan - 08/03/19 0736    Clinical Impression Statement  Pt arrives after missing 3 appointments due to possible COVID exposure. . She has also seen MD who gave [facility] joint injection and recommended PT with F/U in 6 weeks. Her AROM has improved. Able to progress HEP with scap stab.    PT Next Visit Plan  check HEP, postural exercises, arm bike.  Check C spine (Spurling, ULTT)    PT Home Exercise Plan  corner stretch, scap squeeze, supine AAROM flexion and ERyellow band, red band row, added supine horizontal and bilat  ER red band       Patient will benefit from skilled therapeutic intervention in order to improve the following deficits and impairments:  Decreased mobility, Improper body mechanics, Pain, Postural dysfunction, Impaired UE functional use, Impaired flexibility, Increased fascial restricitons, Decreased strength, Decreased range of motion  Visit Diagnosis: Abnormal posture  Acute pain of left shoulder  Muscle weakness (generalized)     Problem List Patient Active Problem List   Diagnosis Date Noted  . AC (acromioclavicular) arthritis 08/02/2019  . 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 Claudication ([place]) 04/19/2019  . Morbid obesity ([place]) 04/19/2019  . Suspected COVID-19 virus infection 04/13/2019  . Upper airway cough syndrome 08/22/2018  . Greater trochanteric bursitis of both hips  06/15/2018  . Trigger point of shoulder region, left 02/07/2018  . Hypertension   . Mild pulmonary hypertension ([place])   . NSVT (nonsustained ventricular tachycardia) ([place])   . PVC's (premature ventricular contractions)   . URI (upper respiratory infection) 08/09/2016  . Neck pain 02/10/2016  . Urinary incontinence 02/10/2016  . Degenerative arthritis of knee, bilateral 07/17/2015  . Knee MCL  sprain 06/07/2015  . Discomfort in chest 02/15/2015  . Chest pain 02/15/2015  . DM type 2, controlled, with complication ([place])   . Wellness examination 08/06/2014  . Acute meniscal tear of knee 02/13/2014  . Primary localized osteoarthrosis, lower leg 02/13/2014  . Gastrocnemius tear 12/25/2013  . UTI (urinary tract infection) 12/20/2013  . Right knee pain 12/20/2013  . Pulmonary hypertension ([place]) 10/06/2013  . Other forms of dyspnea 08/04/2013  . Dysphagia, unspecified(787.20) 08/10/2012  . Hip pain, left 02/02/2012  . Painful respiration 05/25/2011  . Encounter for long-term (current) use of other medications 01/30/2011  . PELVIC PAIN, LEFT 07/30/2010  . TOBACCO USE, QUIT 07/07/2010  . FATIGUE 12/20/2009  . Headache 12/20/2009  . Low back pain 12/26/2008  . Disorder of liver 04/13/2008  . FOOT PAIN, BILATERAL 04/13/2008  . FIBROIDS, UTERUS 11/24/2007  . THYROID NODULE, LEFT 11/24/2007  . HYPERCHOLESTEROLEMIA 11/24/2007  . CARPAL TUNNEL SYNDROME, BILATERAL 11/24/2007  . ALKALINE PHOSPHATASE, ELEVATED 11/24/2007  . Gout 08/10/2007  . ANXIETY 08/10/2007  . Essential hypertension 08/10/2007  . Cough, persistent 08/01/2007  . Perennial and seasonal allergic rhinitis 01/14/2007  . GERD 01/14/2007  . Osteoporosis 01/14/2007    Dorene Ar, PTA 08/03/2019, 8:22 AM  [facility] [address] [place], [place], [address] Phone: [phone]   Fax:  [phone]  Name: Amy Escobar MRN: [id number] Date of Birth: [birth year]

## 2019-08-07 ENCOUNTER — Ambulatory Visit: Payer: Medicare Other | Admitting: Physical Therapy

## 2019-08-07 ENCOUNTER — Other Ambulatory Visit: Payer: Self-pay

## 2019-08-07 ENCOUNTER — Encounter: Payer: Self-pay | Admitting: Physical Therapy

## 2019-08-07 DIAGNOSIS — M6281 Muscle weakness (generalized): Secondary | ICD-10-CM | POA: Diagnosis not present

## 2019-08-07 DIAGNOSIS — R293 Abnormal posture: Secondary | ICD-10-CM | POA: Diagnosis not present

## 2019-08-07 DIAGNOSIS — M25512 Pain in left shoulder: Secondary | ICD-10-CM

## 2019-08-07 DIAGNOSIS — M62838 Other muscle spasm: Secondary | ICD-10-CM | POA: Diagnosis not present

## 2019-08-07 DIAGNOSIS — M545 Low back pain: Secondary | ICD-10-CM | POA: Diagnosis not present

## 2019-08-07 DIAGNOSIS — G8929 Other chronic pain: Secondary | ICD-10-CM | POA: Diagnosis not present

## 2019-08-07 NOTE — Therapy (Signed)
[facility] [place], [place], [address] Phone: [phone]   Fax:  [phone]  Physical Therapy Treatment  Patient Details  Name: Amy Escobar MRN: [id number] Date of Birth: 31-Jan-1946 Referring Provider (PT): Dr. Lynne Leader    Encounter Date: 08/07/2019  PT End of Session - 08/07/19 0932    Visit Number  4    Number of Visits  12    Date for PT Re-Evaluation  08/23/19    PT Start Time  0800    PT Stop Time  0848    PT Time Calculation (min)  48 min       Past Medical History:  Diagnosis Date  . Alkaline phosphatase deficiency    w/u Ne  . Allergic rhinitis   . Anxiety   . Arthritis   . DM2 (diabetes mellitus, type 2) ([place])   . GERD (gastroesophageal reflux disease)   . Gout   . Hemorrhoids   . Hyperlipidemia   . Hypertension   . Mild pulmonary hypertension ([place])   . NSVT (nonsustained ventricular tachycardia) ([place])   . Osteoporosis   . PVC's (premature ventricular contractions)   . Thyroid nodule    small  . Urticaria   . Uterine fibroid     Past Surgical History:  Procedure Laterality Date  . CATARACT EXTRACTION Bilateral 2016  . COLONOSCOPY  2007  . echocardiogram (other)  01/16/2002  . 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 stress cardiolite  02/12/2006  . TOENAIL EXCISION    . TUBAL LIGATION    . TYMPANOSTOMY TUBE PLACEMENT      There were no vitals filed for this visit.  Subjective Assessment - 08/07/19 0810    Subjective  No pain this morning in the shoulder. I have some discomfort in my mid back, under my shoulder blades.                       [facility] Adult PT Treatment/Exercise - 08/07/19 0001      Shoulder Exercises: Supine   Horizontal ABduction  15 reps    Theraband Level (Shoulder Horizontal ABduction)  Level 2 (Red)    External Rotation  15 reps    Theraband Level (Shoulder External Rotation)  Level 2 (Red)    Other Supine Exercises  chest press,  pullovers ,horizontal abduction/adduction, ER/IR   2# on dowel for press ups and pullovers     Shoulder Exercises: Standing   External Rotation  15 reps    Theraband Level (Shoulder External Rotation)  Level 2 (Red)    Internal Rotation  15 reps    Theraband Level (Shoulder Internal Rotation)  Level 2 (Red)    Flexion  10 reps    Shoulder Flexion Weight (lbs)  1    ABduction  10 reps    Shoulder ABduction Weight (lbs)  1    Extension  20 reps    Theraband Level (Shoulder Extension)  Level 2 (Red)    Row  20 reps    Theraband Level (Shoulder Row)  Level 2 (Red)    Other Standing Exercises  bicep curl 3#     Other Standing Exercises  cabinet reaching 1# middle shelf x 10, AROM x 10       Shoulder Exercises: Pulleys   Flexion  2 minutes    Scaption  1 minute      Shoulder Exercises: ROM/Strengthening   UBE (Upper Arm Bike)  L1  forward 2 min, backward x 2 min       Shoulder Exercises: Stretch   Corner Stretch Limitations  --      Moist Heat Therapy   Number Minutes Moist Heat  10 Minutes    Moist Heat Location  Shoulder                  PT Long Term Goals - 07/13/19 1320      PT LONG TERM GOAL #1   Title  Pt will be able to show I with HEP upon last visit for posture and L UE    Time  6    Period  Weeks    Status  New    Target Date  08/23/19      PT LONG TERM GOAL #2   Title  Pt will be able to score no more than 32% impaired on FOTO to demo functional improvement    Time  6    Period  Weeks    Status  New    Target Date  08/23/19      PT LONG TERM GOAL #3   Title  Pt will be able to drive and cook using her non dominant (L) hand with no increase in pain.    Time  6    Period  Weeks    Status  New    Target Date  08/23/19      PT LONG TERM GOAL #4   Title  Pt will show 4/5 strength or more in shoulder flexion and abduction to maximize strength.    Time  6    Period  Weeks    Status  New    Target Date  08/23/19      PT LONG TERM GOAL #5    Title  Pt will understand posture and alignment, how it can affect neck and shoulder pain    Time  6    Period  Weeks    Status  New    Target Date  08/23/19            Plan - 08/07/19 [id number]    Clinical Impression Statement  Pt arrives without pain in her shoulder today. Able to progress strengthening weight and functional reaching. No inreased pain, just fatigue. HMP at end of session.    PT Next Visit Plan  check HEP, postural exercises, arm bike.  Check C spine (Spurling, ULTT);continue to progress strength and stability as tolerated    PT Home Exercise Plan  corner stretch, scap squeeze, supine AAROM flexion and ERyellow band, red band row, added supine horizontal and bilat  ER red band       Patient will benefit from skilled therapeutic intervention in order to improve the following deficits and impairments:  Decreased mobility, Improper body mechanics, Pain, Postural dysfunction, Impaired UE functional use, Impaired flexibility, Increased fascial restricitons, Decreased strength, Decreased range of motion  Visit Diagnosis: Abnormal posture  Acute pain of left shoulder  Muscle weakness (generalized)     Problem List Patient Active Problem List   Diagnosis Date Noted  . AC (acromioclavicular) arthritis 08/02/2019  . 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 Claudication ([place]) 04/19/2019  . Morbid obesity ([place]) 04/19/2019  . Suspected COVID-19 virus infection 04/13/2019  . Upper airway cough syndrome 08/22/2018  . Greater trochanteric bursitis of both hips 06/15/2018  . Trigger point of shoulder region, left 02/07/2018  . Hypertension   . Mild pulmonary hypertension ([place])   .  NSVT (nonsustained ventricular tachycardia) ([place])   . PVC's (premature ventricular contractions)   . URI (upper respiratory infection) 08/09/2016  . Neck pain 02/10/2016  . Urinary incontinence 02/10/2016  . Degenerative arthritis of knee, bilateral 07/17/2015   . Knee MCL sprain 06/07/2015  . Discomfort in chest 02/15/2015  . Chest pain 02/15/2015  . DM type 2, controlled, with complication ([place])   . Wellness examination 08/06/2014  . Acute meniscal tear of knee 02/13/2014  . Primary localized osteoarthrosis, lower leg 02/13/2014  . Gastrocnemius tear 12/25/2013  . UTI (urinary tract infection) 12/20/2013  . Right knee pain 12/20/2013  . Pulmonary hypertension ([place]) 10/06/2013  . Other forms of dyspnea 08/04/2013  . Dysphagia, unspecified(787.20) 08/10/2012  . Hip pain, left 02/02/2012  . Painful respiration 05/25/2011  . Encounter for long-term (current) use of other medications 01/30/2011  . PELVIC PAIN, LEFT 07/30/2010  . TOBACCO USE, QUIT 07/07/2010  . FATIGUE 12/20/2009  . Headache 12/20/2009  . Low back pain 12/26/2008  . Disorder of liver 04/13/2008  . FOOT PAIN, BILATERAL 04/13/2008  . FIBROIDS, UTERUS 11/24/2007  . THYROID NODULE, LEFT 11/24/2007  . HYPERCHOLESTEROLEMIA 11/24/2007  . CARPAL TUNNEL SYNDROME, BILATERAL 11/24/2007  . ALKALINE PHOSPHATASE, ELEVATED 11/24/2007  . Gout 08/10/2007  . ANXIETY 08/10/2007  . Essential hypertension 08/10/2007  . Cough, persistent 08/01/2007  . Perennial and seasonal allergic rhinitis 01/14/2007  . GERD 01/14/2007  . Osteoporosis 01/14/2007    Dorene Ar, PTA 08/07/2019, 9:33 AM  [facility] [place], [place], [address] Phone: [phone]   Fax:  [phone]  Name: Amy Escobar MRN: [id number] Date of Birth: 16-Jan-1946

## 2019-08-09 ENCOUNTER — Ambulatory Visit: Payer: Medicare Other | Admitting: Physical Therapy

## 2019-08-09 ENCOUNTER — Other Ambulatory Visit: Payer: Self-pay

## 2019-08-09 DIAGNOSIS — M545 Low back pain: Secondary | ICD-10-CM | POA: Diagnosis not present

## 2019-08-09 DIAGNOSIS — M6281 Muscle weakness (generalized): Secondary | ICD-10-CM | POA: Diagnosis not present

## 2019-08-09 DIAGNOSIS — M25512 Pain in left shoulder: Secondary | ICD-10-CM

## 2019-08-09 DIAGNOSIS — R293 Abnormal posture: Secondary | ICD-10-CM

## 2019-08-09 DIAGNOSIS — M62838 Other muscle spasm: Secondary | ICD-10-CM | POA: Diagnosis not present

## 2019-08-09 DIAGNOSIS — G8929 Other chronic pain: Secondary | ICD-10-CM | POA: Diagnosis not present

## 2019-08-09 NOTE — Therapy (Signed)
[place], [place], [address] Phone: [phone]   Fax:  [phone]  Physical Therapy Treatment  Patient Details  Name: Amy Escobar MRN: [id number] Date of Birth: January 04, 1946 Referring Provider (PT): Dr. Lynne Leader    Encounter Date: 08/09/2019  PT End of Session - 08/09/19 0840    Visit Number  5    Number of Visits  12    Date for PT Re-Evaluation  08/23/19    PT Start Time  0833    PT Stop Time  0914    PT Time Calculation (min)  41 min    Activity Tolerance  Patient tolerated treatment well    Behavior During Therapy  [facility] for tasks assessed/performed       Past Medical History:  Diagnosis Date  . Alkaline phosphatase deficiency    w/u Ne  . Allergic rhinitis   . Anxiety   . Arthritis   . DM2 (diabetes mellitus, type 2) ([place])   . GERD (gastroesophageal reflux disease)   . Gout   . Hemorrhoids   . Hyperlipidemia   . Hypertension   . Mild pulmonary hypertension ([place])   . NSVT (nonsustained ventricular tachycardia) ([place])   . Osteoporosis   . PVC's (premature ventricular contractions)   . Thyroid nodule    small  . Tubular adenoma of colon 2017  . Urticaria   . Uterine fibroid     Past Surgical History:  Procedure Laterality Date  . CATARACT EXTRACTION Bilateral 2016  . COLONOSCOPY  2007  . echocardiogram (other)  01/16/2002  . 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 stress cardiolite  02/12/2006  . TOENAIL EXCISION    . TUBAL LIGATION    . TYMPANOSTOMY TUBE PLACEMENT      There were no vitals filed for this visit.  Subjective Assessment - 08/09/19 0837    Subjective  No pain. My L arm is getting alot better.  I do my exercises at night.    Currently in Pain?  No/denies         [facility] PT Assessment - 08/09/19 0001      AROM   Left Shoulder Flexion  135 Degrees    Left Shoulder ABduction  15 Degrees    Left Shoulder Internal Rotation  --   reaches T5   Left  Shoulder External Rotation  --   reaches T1      Strength   Left Shoulder Flexion  4/5    Left Shoulder ABduction  4/5    Left Shoulder Internal Rotation  5/5    Left Shoulder External Rotation  4+/5           [facility] Adult PT Treatment/Exercise - 08/09/19 0001      Shoulder Exercises: Supine   Horizontal ABduction  15 reps    Theraband Level (Shoulder Horizontal ABduction)  Level 2 (Red)    External Rotation  15 reps    Theraband Level (Shoulder External Rotation)  Level 2 (Red)    Other Supine Exercises  Cane AAROM: chest press, wide arms overhead x 10    horizontal abduction/adduction      Shoulder Exercises: Standing   Flexion  10 reps    Theraband Level (Shoulder Flexion)  Level 2 (Red)    Shoulder Flexion Weight (lbs)  punch then elbow extended (2 sets )     ABduction  10 reps    Theraband Level (Shoulder ABduction)  Level 2 (Red)  Extension  20 reps    Theraband Level (Shoulder Extension)  Level 2 (Red)    Row  20 reps    Theraband Level (Shoulder Row)  Level 2 (Red)      Shoulder Exercises: ROM/Strengthening   Nustep  5 min L4 Ue and LE                   PT Long Term Goals - 08/09/19 [id number]      PT LONG TERM GOAL #1   Title  Pt will be able to show I with HEP upon last visit for posture and L UE    Status  On-going      PT LONG TERM GOAL #2   Title  Pt will be able to score no more than 32% impaired on FOTO to demo functional improvement    Baseline  1 % better    Status  On-going      PT LONG TERM GOAL #3   Title  Pt will be able to drive and cook using her non dominant (L) hand with no increase in pain.    Baseline  pain increases a little bit    Status  On-going      PT LONG TERM GOAL #4   Title  Pt will show 4/5 strength or more in shoulder flexion and abduction to maximize strength.    Baseline  4/5    Status  Achieved      PT LONG TERM GOAL #5   Title  Pt will understand posture and alignment, how it can affect neck and shoulder  pain    Status  On-going      Additional Long Term Goals   Additional Long Term Goals  Yes      PT LONG TERM GOAL #6   Title  Pt will be able to lift a stack of plates (bilateral UEs) to shoulder height or above without increase in pain    Baseline  min pain L UE    Time  2    Period  Weeks    Status  New    Target Date  08/23/19            Plan - 08/09/19 0850    Clinical Impression Statement  Patient is making great progess in PT, despite only a 1 % improvement in FOTO.  She has increased strength and AROM in L UE.  She needs mod cues for standing exercises against the wall (core engaged).    PT Treatment/Interventions  ADLs/Self Care Home Management;Electrical Stimulation;Cryotherapy;Iontophoresis 4mg /ml Dexamethasone;Moist Heat;Ultrasound;Therapeutic exercise;Therapeutic activities;Functional mobility training;Patient/family education;Manual techniques;Taping;Dry needling;Neuromuscular re-education    PT Next Visit Plan  UBE  postural exercises, arm bike. continue to progress strength and stability as tolerated    PT Home Exercise Plan  corner stretch, scap squeeze, supine AAROM flexion and ERyellow band, red band row, added supine horizontal and bilat  ER red band    Consulted and Agree with Plan of Care  Patient       Patient will benefit from skilled therapeutic intervention in order to improve the following deficits and impairments:  Decreased mobility, Improper body mechanics, Pain, Postural dysfunction, Impaired UE functional use, Impaired flexibility, Increased fascial restricitons, Decreased strength, Decreased range of motion  Visit Diagnosis: Abnormal posture  Acute pain of left shoulder  Muscle weakness (generalized)     Problem List Patient Active Problem List   Diagnosis Date Noted  . AC (acromioclavicular) arthritis 08/02/2019  . Unspecified  inflammatory spondylopathy, 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[place]) 04/19/2019  . Claudication ([place]) 04/19/2019  . Morbid obesity  ([place]) 04/19/2019  . Suspected COVID-19 virus infection 04/13/2019  . Upper airway cough syndrome 08/22/2018  . Greater trochanteric bursitis of both hips 06/15/2018  . Trigger point of shoulder region, left 02/07/2018  . Hypertension   . Mild pulmonary hypertension ([place])   . NSVT (nonsustained ventricular tachycardia) ([place])   . PVC's (premature ventricular contractions)   . URI (upper respiratory infection) 08/09/2016  . Neck pain 02/10/2016  . Urinary incontinence 02/10/2016  . Degenerative arthritis of knee, bilateral 07/17/2015  . Knee MCL sprain 06/07/2015  . Discomfort in chest 02/15/2015  . Chest pain 02/15/2015  . DM type 2, controlled, with complication ([place])   . Wellness examination 08/06/2014  . Acute meniscal tear of knee 02/13/2014  . Primary localized osteoarthrosis, lower leg 02/13/2014  . Gastrocnemius tear 12/25/2013  . UTI (urinary tract infection) 12/20/2013  . Right knee pain 12/20/2013  . Pulmonary hypertension ([place]) 10/06/2013  . Other forms of dyspnea 08/04/2013  . Dysphagia, unspecified(787.20) 08/10/2012  . Hip pain, left 02/02/2012  . Painful respiration 05/25/2011  . Encounter for long-term (current) use of other medications 01/30/2011  . PELVIC PAIN, LEFT 07/30/2010  . TOBACCO USE, QUIT 07/07/2010  . FATIGUE 12/20/2009  . Headache 12/20/2009  . Low back pain 12/26/2008  . Disorder of liver 04/13/2008  . FOOT PAIN, BILATERAL 04/13/2008  . FIBROIDS, UTERUS 11/24/2007  . THYROID NODULE, LEFT 11/24/2007  . HYPERCHOLESTEROLEMIA 11/24/2007  . CARPAL TUNNEL SYNDROME, BILATERAL 11/24/2007  . ALKALINE PHOSPHATASE, ELEVATED 11/24/2007  . Gout 08/10/2007  . ANXIETY 08/10/2007  . Essential hypertension 08/10/2007  . Cough, persistent 08/01/2007  . Perennial and seasonal allergic rhinitis 01/14/2007  . GERD 01/14/2007  . Osteoporosis 01/14/2007    Dimitrious Micciche 08/09/2019, 9:15 AM  [facility] [address] [place], [place], [address] Phone: [phone]   Fax:  [phone]  Name: Alaziah Prante MRN: [id number] Date of Birth: [birth year]  Raeford Razor, PT 08/09/19 9:15 AM Phone: [phone] Fax: [phone]

## 2019-08-13 ENCOUNTER — Ambulatory Visit: Payer: Medicare Other | Attending: Internal Medicine

## 2019-08-13 DIAGNOSIS — Z23 Encounter for immunization: Secondary | ICD-10-CM | POA: Insufficient documentation

## 2019-08-13 NOTE — Progress Notes (Signed)
   Covid-19 Vaccination Clinic  Name:  Amy Escobar    MRN: [id number] DOB: 07-09-1945  08/13/2019  Amy Escobar was observed post Covid-19 immunization for 15 minutes without incidence. She was provided with Vaccine Information Sheet and instruction to access the V-Safe system.   Amy Escobar was instructed to call 911 with any severe reactions post vaccine: Marland Kitchen Difficulty breathing  . Swelling of your face and throat  . A fast heartbeat  . A bad rash all over your body  . Dizziness and weakness    Immunizations Administered    Name Date Dose VIS Date Route   Pfizer COVID-19 Vaccine 08/13/2019  9:28 AM 0.3 mL 06/09/2019 Intramuscular   Manufacturer: [place]   Lot: [id number]   [place]: [id number]

## 2019-08-14 ENCOUNTER — Ambulatory Visit: Payer: Medicare Other | Admitting: Physical Therapy

## 2019-08-14 DIAGNOSIS — R293 Abnormal posture: Secondary | ICD-10-CM | POA: Diagnosis not present

## 2019-08-14 DIAGNOSIS — M545 Low back pain: Secondary | ICD-10-CM | POA: Diagnosis not present

## 2019-08-14 DIAGNOSIS — M25512 Pain in left shoulder: Secondary | ICD-10-CM

## 2019-08-14 DIAGNOSIS — M6281 Muscle weakness (generalized): Secondary | ICD-10-CM

## 2019-08-14 DIAGNOSIS — M62838 Other muscle spasm: Secondary | ICD-10-CM | POA: Diagnosis not present

## 2019-08-14 DIAGNOSIS — G8929 Other chronic pain: Secondary | ICD-10-CM

## 2019-08-15 ENCOUNTER — Encounter: Payer: Self-pay | Admitting: Physical Therapy

## 2019-08-15 NOTE — Therapy (Signed)
[place], [place], [address] Phone: [phone]   Fax:  [phone]  Physical Therapy Treatment  Patient Details  Name: Amy Escobar MRN: [id number] Date of Birth: January 30, 1946 Referring Provider (PT): Dr. Lynne Leader    Encounter Date: 08/14/2019  PT End of Session - 08/14/19 0724    Visit Number  6    Number of Visits  12    Date for PT Re-Evaluation  08/23/19    PT Start Time  0715    PT Stop Time  0810    PT Time Calculation (min)  55 min       Past Medical History:  Diagnosis Date  . Alkaline phosphatase deficiency    w/u Ne  . Allergic rhinitis   . Anxiety   . Arthritis   . DM2 (diabetes mellitus, type 2) ([place])   . GERD (gastroesophageal reflux disease)   . Gout   . Hemorrhoids   . Hyperlipidemia   . Hypertension   . Mild pulmonary hypertension ([place])   . NSVT (nonsustained ventricular tachycardia) ([place])   . Osteoporosis   . PVC's (premature ventricular contractions)   . Thyroid nodule    small  . Tubular adenoma of colon 2017  . Urticaria   . Uterine fibroid     Past Surgical History:  Procedure Laterality Date  . CATARACT EXTRACTION Bilateral 2016  . COLONOSCOPY  2007  . echocardiogram (other)  01/16/2002  . 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 stress cardiolite  02/12/2006  . TOENAIL EXCISION    . TUBAL LIGATION    . TYMPANOSTOMY TUBE PLACEMENT      There were no vitals filed for this visit.  Subjective Assessment - 08/14/19 0723    Subjective  No pain.    Currently in Pain?  No/denies                       [facility] Adult PT Treatment/Exercise - 08/15/19 0001      Shoulder Exercises: Supine   Horizontal ABduction  15 reps    Theraband Level (Shoulder Horizontal ABduction)  Level 3 (Green)    Flexion  10 reps    Shoulder Flexion Weight (lbs)  2      Shoulder Exercises: Sidelying   External Rotation  10 reps    External Rotation Weight (lbs)   2      Shoulder Exercises: Standing   External Rotation  15 reps    Theraband Level (Shoulder External Rotation)  Level 2 (Red)    Internal Rotation  15 reps    Theraband Level (Shoulder Internal Rotation)  Level 2 (Red)    Flexion  10 reps    Shoulder Flexion Weight (lbs)  2    ABduction  10 reps    Shoulder ABduction Weight (lbs)  2    Extension  20 reps    Theraband Level (Shoulder Extension)  Level 3 (Green)    Row  20 reps    Theraband Level (Shoulder Row)  Level 3 (Green)    Other Standing Exercises  wall wash with pillow case x minute     Other Standing Exercises  cabinet reaching 2# middle shelf x 15       Shoulder Exercises: Pulleys   Flexion  2 minutes      Shoulder Exercises: ROM/Strengthening   UBE (Upper Arm Bike)  L1 forward 2 min, backward x 2 min  Nustep  5 min L4 Ue and LE       Moist Heat Therapy   Number Minutes Moist Heat  10 Minutes    Moist Heat Location  Shoulder                  PT Long Term Goals - 08/09/19 0835      PT LONG TERM GOAL #1   Title  Pt will be able to show I with HEP upon last visit for posture and L UE    Status  On-going      PT LONG TERM GOAL #2   Title  Pt will be able to score no more than 32% impaired on FOTO to demo functional improvement    Baseline  1 % better    Status  On-going      PT LONG TERM GOAL #3   Title  Pt will be able to drive and cook using her non dominant (L) hand with no increase in pain.    Baseline  pain increases a little bit    Status  On-going      PT LONG TERM GOAL #4   Title  Pt will show 4/5 strength or more in shoulder flexion and abduction to maximize strength.    Baseline  4/5    Status  Achieved      PT LONG TERM GOAL #5   Title  Pt will understand posture and alignment, how it can affect neck and shoulder pain    Status  On-going      Additional Long Term Goals   Additional Long Term Goals  Yes      PT LONG TERM GOAL #6   Title  Pt will be able to lift a stack of  plates (bilateral UEs) to shoulder height or above without increase in pain    Baseline  min pain L UE    Time  2    Period  Weeks    Status  New    Target Date  08/23/19            Plan - 08/14/19 0724    Clinical Impression Statement  Pt arrives without pain. Continued with ROM and strengthening as tolerated. No c/o pain, only fatigue.  HMP at end of session.    PT Next Visit Plan  UBE  postural exercises, arm bike. continue to progress strength and stability as tolerated    PT Home Exercise Plan  corner stretch, scap squeeze, supine AAROM flexion and ERyellow band, red band row, added supine horizontal and bilat  ER red band       Patient will benefit from skilled therapeutic intervention in order to improve the following deficits and impairments:  Decreased mobility, Improper body mechanics, Pain, Postural dysfunction, Impaired UE functional use, Impaired flexibility, Increased fascial restricitons, Decreased strength, Decreased range of motion  Visit Diagnosis: Abnormal posture  Acute pain of left shoulder  Muscle weakness (generalized)  Other muscle spasm  Chronic bilateral low back pain without sciatica     Problem List Patient Active Problem List   Diagnosis Date Noted  . AC (acromioclavicular) arthritis 08/02/2019  . Unspecified inflammatory spondylopathy, 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[place]) 04/19/2019  . Claudication ([place]) 04/19/2019  . Morbid obesity ([place]) 04/19/2019  . Suspected COVID-19 virus infection 04/13/2019  . Upper airway cough syndrome 08/22/2018  . Greater trochanteric bursitis of both hips 06/15/2018  . Trigger point of shoulder region, left 02/07/2018  . Hypertension   . Mild  pulmonary hypertension ([place])   . NSVT (nonsustained ventricular tachycardia) ([place])   . PVC's (premature ventricular contractions)   . URI (upper respiratory infection) 08/09/2016  . Neck pain 02/10/2016  . Urinary incontinence 02/10/2016  . Degenerative arthritis of knee,  bilateral 07/17/2015  . Knee MCL sprain 06/07/2015  . Discomfort in chest 02/15/2015  . Chest pain 02/15/2015  . DM type 2, controlled, with complication ([place])   . Wellness examination 08/06/2014  . Acute meniscal tear of knee 02/13/2014  . Primary localized osteoarthrosis, lower leg 02/13/2014  . Gastrocnemius tear 12/25/2013  . UTI (urinary tract infection) 12/20/2013  . Right knee pain 12/20/2013  . Pulmonary hypertension ([place]) 10/06/2013  . Other forms of dyspnea 08/04/2013  . Dysphagia, unspecified(787.20) 08/10/2012  . Hip pain, left 02/02/2012  . Painful respiration 05/25/2011  . Encounter for long-term (current) use of other medications 01/30/2011  . PELVIC PAIN, LEFT 07/30/2010  . TOBACCO USE, QUIT 07/07/2010  . FATIGUE 12/20/2009  . Headache 12/20/2009  . Low back pain 12/26/2008  . Disorder of liver 04/13/2008  . FOOT PAIN, BILATERAL 04/13/2008  . FIBROIDS, UTERUS 11/24/2007  . THYROID NODULE, LEFT 11/24/2007  . HYPERCHOLESTEROLEMIA 11/24/2007  . CARPAL TUNNEL SYNDROME, BILATERAL 11/24/2007  . ALKALINE PHOSPHATASE, ELEVATED 11/24/2007  . Gout 08/10/2007  . ANXIETY 08/10/2007  . Essential hypertension 08/10/2007  . Cough, persistent 08/01/2007  . Perennial and seasonal allergic rhinitis 01/14/2007  . GERD 01/14/2007  . Osteoporosis 01/14/2007    Dorene Ar, PTA 08/15/2019, 7:29 AM  [place] [place], [place], [address] Phone: [phone]   Fax:  [phone]  Name: Amy Escobar MRN: [id number] Date of Birth: [birth year]

## 2019-08-16 ENCOUNTER — Encounter: Payer: Self-pay | Admitting: Gastroenterology

## 2019-08-16 ENCOUNTER — Encounter: Payer: Self-pay | Admitting: Physical Therapy

## 2019-08-16 ENCOUNTER — Other Ambulatory Visit: Payer: Self-pay

## 2019-08-16 ENCOUNTER — Ambulatory Visit: Payer: Medicare Other | Admitting: Physical Therapy

## 2019-08-16 DIAGNOSIS — M6281 Muscle weakness (generalized): Secondary | ICD-10-CM | POA: Diagnosis not present

## 2019-08-16 DIAGNOSIS — G8929 Other chronic pain: Secondary | ICD-10-CM

## 2019-08-16 DIAGNOSIS — M62838 Other muscle spasm: Secondary | ICD-10-CM

## 2019-08-16 DIAGNOSIS — R293 Abnormal posture: Secondary | ICD-10-CM

## 2019-08-16 DIAGNOSIS — M25512 Pain in left shoulder: Secondary | ICD-10-CM | POA: Diagnosis not present

## 2019-08-16 DIAGNOSIS — M545 Low back pain: Secondary | ICD-10-CM | POA: Diagnosis not present

## 2019-08-16 NOTE — Therapy (Signed)
[name], [place], [address] Phone: [phone]   Fax:  [phone]  Physical Therapy Treatment  Patient Details  Name: Amy Escobar MRN: [id number] Date of Birth: 1945-08-03 Referring Provider (PT): Dr. Lynne Leader    Encounter Date: 08/16/2019  PT End of Session - 08/16/19 0723    Visit Number  7    Number of Visits  12    Date for PT Re-Evaluation  08/23/19    PT Start Time  0715    PT Stop Time  [id number]    PT Time Calculation (min)  32 min       Past Medical History:  Diagnosis Date  . Alkaline phosphatase deficiency    w/u Ne  . Allergic rhinitis   . Anxiety   . Arthritis   . DM2 (diabetes mellitus, type 2) ([place])   . GERD (gastroesophageal reflux disease)   . Gout   . Hemorrhoids   . Hyperlipidemia   . Hypertension   . Mild pulmonary hypertension ([place])   . NSVT (nonsustained ventricular tachycardia) ([place])   . Osteoporosis   . PVC's (premature ventricular contractions)   . Thyroid nodule    small  . Tubular adenoma of colon 2017  . Urticaria   . Uterine fibroid     Past Surgical History:  Procedure Laterality Date  . CATARACT EXTRACTION Bilateral 2016  . COLONOSCOPY  2007  . echocardiogram (other)  01/16/2002  . 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 stress cardiolite  02/12/2006  . TOENAIL EXCISION    . TUBAL LIGATION    . TYMPANOSTOMY TUBE PLACEMENT      There were no vitals filed for this visit.  Subjective Assessment - 08/16/19 0717    Subjective  I had severe pain along left side of back across shoulder blade starting the night after I was here last. Still sore.    Currently in Pain?  Yes    Pain Score  6     Pain Location  Shoulder    Pain Orientation  Posterior    Pain Descriptors / Indicators  Sore   tender   Aggravating Factors   nothing lately until I woke up sore    Pain Relieving Factors  tylenol, rest                       [facility] Adult  PT Treatment/Exercise - 08/16/19 0001      Shoulder Exercises: Supine   Horizontal ABduction  15 reps    Theraband Level (Shoulder Horizontal ABduction)  Level 3 (Green)    Flexion  15 reps    Shoulder Flexion Weight (lbs)  2    Other Supine Exercises  Cane AAROM: chest press, wide arms overhead x 10    horizontal abduction/adduction      Shoulder Exercises: Sidelying   External Rotation  10 reps    External Rotation Weight (lbs)  2      Shoulder Exercises: Standing   External Rotation  15 reps    Theraband Level (Shoulder External Rotation)  Level 2 (Red)    Internal Rotation  15 reps    Theraband Level (Shoulder Internal Rotation)  Level 2 (Red)    Flexion  10 reps    Shoulder Flexion Weight (lbs)  2    ABduction  10 reps    Shoulder ABduction Weight (lbs)  2    Extension  15 reps  Theraband Level (Shoulder Extension)  Level 2 (Red)    Row  15 reps    Theraband Level (Shoulder Row)  Level 3 (Green)    Other Standing Exercises  wall wash with pillow case x minute       Shoulder Exercises: Pulleys   Flexion  2 minutes      Shoulder Exercises: ROM/Strengthening   UBE (Upper Arm Bike)  L1 forward 2 min, backward x 2 min                   PT Long Term Goals - 08/09/19 [id number]      PT LONG TERM GOAL #1   Title  Pt will be able to show I with HEP upon last visit for posture and L UE    Status  On-going      PT LONG TERM GOAL #2   Title  Pt will be able to score no more than 32% impaired on FOTO to demo functional improvement    Baseline  1 % better    Status  On-going      PT LONG TERM GOAL #3   Title  Pt will be able to drive and cook using her non dominant (L) hand with no increase in pain.    Baseline  pain increases a little bit    Status  On-going      PT LONG TERM GOAL #4   Title  Pt will show 4/5 strength or more in shoulder flexion and abduction to maximize strength.    Baseline  4/5    Status  Achieved      PT LONG TERM GOAL #5   Title  Pt will  understand posture and alignment, how it can affect neck and shoulder pain    Status  On-going      Additional Long Term Goals   Additional Long Term Goals  Yes      PT LONG TERM GOAL #6   Title  Pt will be able to lift a stack of plates (bilateral UEs) to shoulder height or above without increase in pain    Baseline  min pain L UE    Time  2    Period  Weeks    Status  New    Target Date  08/23/19            Plan - 08/16/19 [id number]    Clinical Impression Statement  Pt arrives with c/o increased soreness on latissimus and shoulder blade, perhaps due to increased strength challenges at last session. She was abel to complete all exercises and felt increased soreness with scapular bands and supine horizontal abduction. She will use ice and heat at home to reduce soreness.    PT Next Visit Plan  UBE  postural exercises, arm bike. continue to progress strength and stability as tolerated    PT Home Exercise Plan  corner stretch, scap squeeze, supine AAROM flexion and ERyellow band, red band row, added supine horizontal and bilat  ER red band       Patient will benefit from skilled therapeutic intervention in order to improve the following deficits and impairments:  Decreased mobility, Improper body mechanics, Pain, Postural dysfunction, Impaired UE functional use, Impaired flexibility, Increased fascial restricitons, Decreased strength, Decreased range of motion  Visit Diagnosis: Abnormal posture  Acute pain of left shoulder  Muscle weakness (generalized)  Other muscle spasm  Chronic bilateral low back pain without sciatica     Problem List Patient Active  Problem List   Diagnosis Date Noted  . AC (acromioclavicular) arthritis 08/02/2019  . Unspecified inflammatory spondylopathy, 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[place]) 04/19/2019  . Claudication ([place]) 04/19/2019  . Morbid obesity ([place]) 04/19/2019  . Suspected COVID-19 virus infection 04/13/2019  . Upper airway cough syndrome 08/22/2018  .  Greater trochanteric bursitis of both hips 06/15/2018  . Trigger point of shoulder region, left 02/07/2018  . Hypertension   . Mild pulmonary hypertension ([place])   . NSVT (nonsustained ventricular tachycardia) ([place])   . PVC's (premature ventricular contractions)   . URI (upper respiratory infection) 08/09/2016  . Neck pain 02/10/2016  . Urinary incontinence 02/10/2016  . Degenerative arthritis of knee, bilateral 07/17/2015  . Knee MCL sprain 06/07/2015  . Discomfort in chest 02/15/2015  . Chest pain 02/15/2015  . DM type 2, controlled, with complication ([place])   . Wellness examination 08/06/2014  . Acute meniscal tear of knee 02/13/2014  . Primary localized osteoarthrosis, lower leg 02/13/2014  . Gastrocnemius tear 12/25/2013  . UTI (urinary tract infection) 12/20/2013  . Right knee pain 12/20/2013  . Pulmonary hypertension ([place]) 10/06/2013  . Other forms of dyspnea 08/04/2013  . Dysphagia, unspecified(787.20) 08/10/2012  . Hip pain, left 02/02/2012  . Painful respiration 05/25/2011  . Encounter for long-term (current) use of other medications 01/30/2011  . PELVIC PAIN, LEFT 07/30/2010  . TOBACCO USE, QUIT 07/07/2010  . FATIGUE 12/20/2009  . Headache 12/20/2009  . Low back pain 12/26/2008  . Disorder of liver 04/13/2008  . FOOT PAIN, BILATERAL 04/13/2008  . FIBROIDS, UTERUS 11/24/2007  . THYROID NODULE, LEFT 11/24/2007  . HYPERCHOLESTEROLEMIA 11/24/2007  . CARPAL TUNNEL SYNDROME, BILATERAL 11/24/2007  . ALKALINE PHOSPHATASE, ELEVATED 11/24/2007  . Gout 08/10/2007  . ANXIETY 08/10/2007  . Essential hypertension 08/10/2007  . Cough, persistent 08/01/2007  . Perennial and seasonal allergic rhinitis 01/14/2007  . GERD 01/14/2007  . Osteoporosis 01/14/2007    Dorene Ar, PTA 08/16/2019, 8:35 AM  [place], [place], [address] Phone: [phone]   Fax:  [phone]  Name: Amy Escobar MRN: [id number] Date of Birth: 02/03/1946

## 2019-08-16 NOTE — Progress Notes (Signed)
Cardiology Office Note    Date:  08/29/2019   ID:  Amy, Escobar August 19, 1945, MRN [id number]  PCP:  [name], [place]  Cardiologist: Ena Dawley, MD EPS: None  No chief complaint on file.   History of Present Illness:  Amy Escobar is a 74 y.o. female with history of hypertension, hyperlipidemia, IDDM, moderate pulmonary hypertension, moderate nonobstructive CAD, coronary CTA 2018 calcium score 220 which is 81st percentile for age and sex match control, moderate plaque in the ostial left main and mild plaque in the ostial RCA.  Patient saw Dr. Meda Escobar 02/23/2019 complaining of dyspnea on exertion and some chest pain.  Nuclear stress test was ordered.  Also 2D echo because of moderate pulmonary hypertension.  2D echo 03/10/2019 normal LVEF 55 to 60% borderline enlarged pulmonary artery, normal right ventricular systolic pressure.  NST was normal.  Patient comes in for f/u. Has a lot of reflux and allergies. Had a muscle strain in chest with left shoulder and arm pain and has undergone PT. Doing much better. Whenever she reaches or uses her left arm and causes a "running pain in her chest". Starting to try to exercise again. No chest pressure.   Past Medical History:  Diagnosis Date  . Alkaline phosphatase deficiency    w/u Ne  . Allergic rhinitis   . Anxiety   . Arthritis   . DM2 (diabetes mellitus, type 2) ([place])   . GERD (gastroesophageal reflux disease)   . Gout   . Hemorrhoids   . Hyperlipidemia   . Hypertension   . Mild pulmonary hypertension ([place])   . NSVT (nonsustained ventricular tachycardia) ([place])   . Osteoporosis   . PVC's (premature ventricular contractions)   . Thyroid nodule    small  . Tubular adenoma of colon 2017  . Urticaria   . Uterine fibroid     Past Surgical History:  Procedure Laterality Date  . CATARACT EXTRACTION Bilateral 2016  . COLONOSCOPY  2007  . echocardiogram (other)  01/16/2002  . removed tumors from foot nerves   04/1999  . stress cardiolite  02/12/2006  . TOENAIL EXCISION    . TUBAL LIGATION    . TYMPANOSTOMY TUBE PLACEMENT      Current Medications: Current Meds  Medication Sig  . amLODipine (NORVASC) 2.5 MG tablet TAKE ONE TABLET BY MOUTH DAILY  . aspirin EC 81 MG tablet Take 81 mg by mouth daily.  . Azelastine-Fluticasone 137-50 MCG/ACT SUSP Place 1 spray into the nose 2 (two) times daily.  . benzonatate (TESSALON) 200 MG capsule Take 1 capsule (200 mg total) by mouth 3 (three) times daily as needed for cough.  . Carbinoxamine Maleate 4 MG TABS Take 1 tablet (4 mg total) by mouth every 8 (eight) hours as needed.  . Chlorphen-Pseudoephed-APAP (CORICIDIN D PO) Take by mouth as needed.   . chlorpheniramine (CHLOR-TRIMETON) 4 MG tablet Take 4 mg by mouth every 4 (four) hours as needed for allergies.  Marland Kitchen esomeprazole (NEXIUM) 40 MG capsule TAKE ONE CAPSULE BY MOUTH 30 TO 60 MINUTES BEFORE YOUR FIRST AND LAST MEALS OF THE DAY  . fesoterodine (TOVIAZ) 4 MG TB24 tablet Take 4 mg daily by mouth.  . gabapentin (NEURONTIN) 600 MG tablet Take 600 mg by mouth 3 (three) times daily.  Marland Kitchen glucose blood (ONETOUCH VERIO) test strip 1 each by Other route daily.  . hydrocortisone 2.5 % lotion   . metFORMIN (GLUCOPHAGE-XR) 500 MG 24 hr tablet TAKE TWO TABLETS BY MOUTH  TWICE A DAY  . Respiratory Therapy Supplies MISC Use flutter valve as needed to break the coughing cycle.  . rosuvastatin (CRESTOR) 5 MG tablet TAKE ONE TABLET BY MOUTH DAILY  . triamcinolone cream (KENALOG) 0.1 % APPLY 1 APPLICATION TOPICALLY 4 (FOUR) TIMES DAILY AS NEEDED FOR RASH  . UNKNOWN TO PATIENT Inject as directed every 6 (six) months. 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 valsartan (DIOVAN) 80 MG tablet TAKE ONE TABLET BY MOUTH DAILY  . Vitamin D, Ergocalciferol, (DRISDOL) 1.25 MG (50000 UT) CAPS capsule TAKE ONE CAPSULE BY MOUTH ONCE WEEKLY     Allergies:   Olmesartan medoxomil   Social History   Socioeconomic History  .  Marital status: Widowed    Spouse name: Not on file  . Number of children: 2  . Years of education: Not on file  . Highest education level: Not on file  Occupational History  . Occupation: Surveyor, minerals: UNEMPLOYED  Tobacco Use  . Smoking status: Former Smoker    Packs/day: 0.25    Years: 5.00    Pack years: 1.25    Types: Cigarettes    Quit date: 06/29/1968    Years since quitting: 51.2  . Smokeless tobacco: Never Used  Substance and Sexual Activity  . Alcohol use: No    Alcohol/week: 0.0 standard drinks  . Drug use: No  . Sexual activity: Not on file  Other Topics Concern  . Not on file  Social History Narrative   Widowed 2010.    Social Determinants of Health   Financial Resource Strain:   . Difficulty of Paying Living Expenses: Not on file  Food Insecurity:   . Worried About Charity fundraiser in the Last Year: Not on file  . Ran Out of Food in the Last Year: Not on file  Transportation Needs:   . Lack of Transportation (Medical): Not on file  . Lack of Transportation (Non-Medical): Not on file  Physical Activity:   . Days of Exercise per Week: Not on file  . Minutes of Exercise per Session: Not on file  Stress:   . Feeling of Stress : Not on file  Social Connections:   . Frequency of Communication with Friends and Family: Not on file  . Frequency of Social Gatherings with Friends and Family: Not on file  . Attends Religious Services: Not on file  . Active Member of Clubs or Organizations: Not on file  . Attends Archivist Meetings: Not on file  . Marital Status: Not on file     Family History:  The patient's   family history includes Allergic rhinitis in her mother; Heart disease in her father, maternal grandfather, and mother; Rectal cancer in her maternal grandfather; Stomach cancer in her maternal grandmother.   ROS:   Please see the history of present illness.    ROS All other systems reviewed and are negative.   PHYSICAL EXAM:     VS:  BP 124/78   Pulse 94   Ht 5' 4.5" (1.638 m)   Wt 159 lb 9.6 oz (72.4 kg)   SpO2 94%   BMI 26.97 kg/m   Physical Exam  GEN: Well nourished, well developed, in no acute distress  Neck: no JVD, carotid bruits, or masses Cardiac:RRR; no murmurs, rubs, or gallops  Respiratory:  clear to auscultation bilaterally, normal work of breathing GI: soft, nontender, nondistended, + BS Ext: without cyanosis, clubbing, or edema, Good distal pulses  bilaterally Neuro:  Alert and Oriented x 3 Psych: euthymic mood, full affect  Wt Readings from Last 3 Encounters:  08/29/19 159 lb 9.6 oz (72.4 kg)  08/02/19 159 lb (72.1 kg)  07/19/19 158 lb (71.7 kg)      Studies/Labs Reviewed:   EKG:  EKG is not ordered today.    Recent Labs: 11/30/2018: Hemoglobin 12.8; Magnesium 1.9; Platelets 218.0; TSH 1.56 08/22/2019: ALT 20; BUN 5; Creatinine, Ser 0.60; Potassium 3.7; Sodium 141   Lipid Panel    Component Value Date/Time   CHOL 139 08/22/2019 0746   TRIG 106 08/22/2019 0746   TRIG 48 05/03/2006 0733   HDL 52 08/22/2019 0746   CHOLHDL 2.7 08/22/2019 0746   CHOLHDL 3 07/04/2018 1554   VLDL 12.8 07/04/2018 1554   LDLCALC 68 08/22/2019 0746   LDLDIRECT 165.6 06/19/2009 0731    Additional studies/ records that were reviewed today include:  NST 03/10/2019  Nuclear stress EF: 59%.  There was no ST segment deviation noted during stress.  The study is normal.  This is a low risk study.  The left ventricular ejection fraction is normal (55-65%).   Normal stress nuclear study with no ischemia or infarction.  Gated ejection fraction 59% with normal wall motion.    Nuclear History and Indications   2D echo 03/10/2019 IMPRESSIONS     1. The left ventricle has normal systolic function, with an ejection  fraction of 55-60%. The cavity size was normal. There is mild concentric  left ventricular hypertrophy. Left ventricular diastolic Doppler  parameters are indeterminate. No evidence of   left ventricular regional wall motion abnormalities.   2. Trivial pericardial effusion is present.   3. The aortic valve is tricuspid. No stenosis of the aortic valve.   4. The aorta is normal unless otherwise noted.   5. The aortic root, ascending aorta and aortic arch are normal in size  and structure.   6. Borderline enlarged pulmonary artery.   FINDINGS   Left Ventricle: The left ventricle has normal systolic function, with an  ejection fraction of 55-60%. The cavity size was normal. There is mild  concentric left ventricular hypertrophy. Left ventricular diastolic  Doppler parameters are indeterminate. No  evidence of left ventricular regional wall motion abnormalities..   Right Ventricle: The right ventricle has low normal systolic function. The  cavity was normal. There is no increase in right ventricular wall  thickness. Right ventricular systolic pressure is normal.   Left Atrium: Left atrial size was normal in size.   Right Atrium: Right atrial size was normal in size.   Interatrial Septum: No atrial level shunt detected by color flow Doppler.   Pericardium: Trivial pericardial effusion is present. There is a  pericardial fat pad noted.   Mitral Valve: The mitral valve is normal in structure. Mitral valve  regurgitation is trivial by color flow Doppler.   Tricuspid Valve: The tricuspid valve is normal in structure. Tricuspid  valve regurgitation is mild by color flow Doppler.   Aortic Valve: The aortic valve is tricuspid Aortic valve regurgitation was  not visualized by color flow Doppler. There is No stenosis of the aortic  valve.   Pulmonic Valve: The pulmonic valve was grossly normal. Pulmonic valve  regurgitation is not visualized by color flow Doppler. No evidence of  pulmonic stenosis.   Aorta: The aortic root, ascending aorta and aortic arch are normal in size  and structure. The aorta is normal unless otherwise noted.   Pulmonary Artery:  The pulmonary  artery is borderline enlarged.       ASSESSMENT:    1. Coronary artery disease involving native coronary artery of native heart without angina pectoris   2. Essential hypertension   3. History of pulmonary hypertension   4. Hyperlipidemia, unspecified hyperlipidemia type      PLAN:  In order of problems listed above:  CAD nonobstructive on coronary CTA 2015, normal NST 02/2019, chest pain felt to be muscular and improved with PT. No changes.  Essential hypertension BP controlled on amlodipine and Diovan, renal function normal last month.  History of pulmonary hypertension echo [id number] RV systolic pressure is normal  Hyperlipidemia LDL 68-2/23/21 on low-dose Crestor    Medication Adjustments/Labs and Tests Ordered: Current medicines are reviewed at length with the patient today.  Concerns regarding medicines are outlined above.  Medication changes, Labs and Tests ordered today are listed in the Patient Instructions below. Patient Instructions  Medication Instructions:   Your physician recommends that you continue on your current medications as directed. Please refer to the Current Medication list given to you today.  *If you need a refill on your cardiac medications before your next appointment, please call your pharmacy*   Lab Work: [place]   If you have labs (blood work) drawn today and your tests are completely normal, you will receive your results only by: Marland Kitchen MyChart Message (if you have MyChart) OR . A paper copy in the mail If you have any lab test that is abnormal or we need to change your treatment, we will call you to review the results.   Testing/Procedures: NONE ORDERED  TODAY  Follow-Up: At [facility], you and your health needs are our priority.  As part of our continuing mission to provide you with exceptional heart care, we have created designated Provider Care Teams.  These Care Teams include your primary Cardiologist (physician) and  Advanced Practice Providers (APPs -  Physician Assistants and Nurse Practitioners) who all work together to provide you with the care you need, when you need it.  We recommend signing up for the patient portal called "MyChart".  Sign up information is provided on this After Visit Summary.  MyChart is used to connect with patients for Virtual Visits (Telemedicine).  Patients are able to view lab/test results, encounter notes, upcoming appointments, etc.  Non-urgent messages can be sent to your provider as well.   To learn more about what you can do with MyChart, go to [email or web address].    Your next appointment:   6 month(s)  The format for your next appointment:   In Person  Provider:   You may see Ena Dawley, MD or one of the following Advanced Practice Providers on your designated Care Team:    Melina Copa, PA-C  Ermalinda Barrios, PA-C   Other Instructions      Signed, Ermalinda Barrios, PA-C  08/29/2019 10:06 AM    [facility] [place], [place], [place]  [address] Phone: [phone]; Fax: [phone]

## 2019-08-17 ENCOUNTER — Ambulatory Visit: Payer: Medicare Other | Admitting: Gastroenterology

## 2019-08-21 ENCOUNTER — Encounter: Payer: Self-pay | Admitting: Physical Therapy

## 2019-08-21 ENCOUNTER — Ambulatory Visit: Payer: Medicare Other | Admitting: Physical Therapy

## 2019-08-21 ENCOUNTER — Other Ambulatory Visit: Payer: Self-pay

## 2019-08-21 DIAGNOSIS — R293 Abnormal posture: Secondary | ICD-10-CM | POA: Diagnosis not present

## 2019-08-21 DIAGNOSIS — M62838 Other muscle spasm: Secondary | ICD-10-CM

## 2019-08-21 DIAGNOSIS — M25512 Pain in left shoulder: Secondary | ICD-10-CM

## 2019-08-21 DIAGNOSIS — M6281 Muscle weakness (generalized): Secondary | ICD-10-CM | POA: Diagnosis not present

## 2019-08-21 DIAGNOSIS — G8929 Other chronic pain: Secondary | ICD-10-CM | POA: Diagnosis not present

## 2019-08-21 DIAGNOSIS — M545 Low back pain: Secondary | ICD-10-CM | POA: Diagnosis not present

## 2019-08-21 NOTE — Therapy (Signed)
[place], [place], [address] Phone: [phone]   Fax:  [phone]  Physical Therapy Treatment  Patient Details  Name: Amy Escobar MRN: [id number] Date of Birth: Nov 03, 1945 Referring Provider (PT): Dr. Lynne Leader    Encounter Date: 08/21/2019  PT End of Session - 08/21/19 0728    Visit Number  8    Number of Visits  12    Date for PT Re-Evaluation  08/23/19    PT Start Time  0715    PT Stop Time  0810    PT Time Calculation (min)  55 min       Past Medical History:  Diagnosis Date  . Alkaline phosphatase deficiency    w/u Ne  . Allergic rhinitis   . Anxiety   . Arthritis   . DM2 (diabetes mellitus, type 2) ([place])   . GERD (gastroesophageal reflux disease)   . Gout   . Hemorrhoids   . Hyperlipidemia   . Hypertension   . Mild pulmonary hypertension ([place])   . NSVT (nonsustained ventricular tachycardia) ([place])   . Osteoporosis   . PVC's (premature ventricular contractions)   . Thyroid nodule    small  . Tubular adenoma of colon 2017  . Urticaria   . Uterine fibroid     Past Surgical History:  Procedure Laterality Date  . CATARACT EXTRACTION Bilateral 2016  . COLONOSCOPY  2007  . echocardiogram (other)  01/16/2002  . 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 stress cardiolite  02/12/2006  . TOENAIL EXCISION    . TUBAL LIGATION    . TYMPANOSTOMY TUBE PLACEMENT      There were no vitals filed for this visit.  Subjective Assessment - 08/21/19 0724    Subjective  I was hurting again since last sesson. Very sore in left ahoulder blade area. Felt much better after I put ice on it.    Currently in Pain?  Yes    Pain Score  5     Pain Location  Shoulder    Pain Orientation  Posterior    Pain Descriptors / Indicators  Sore    Aggravating Factors   exercises.    Pain Relieving Factors  ice, tylenol         [facility] PT Assessment - 08/21/19 0001      Observation/Other Assessments    Focus on Therapeutic Outcomes (FOTO)   41%      ROM / Strength   AROM / PROM / Strength  Strength      PROM   Overall PROM Comments  --      Strength   Strength Assessment Site  Hand    Right/Left hand  Right;Left    Right Hand Grip (lbs)  42   average   Left Hand Grip (lbs)  31   average, dominant hand                  [facility] Adult PT Treatment/Exercise - 08/21/19 0001      Shoulder Exercises: Standing   Retraction  10 reps    Other Standing Exercises  wall slides flexion and abduction     Other Standing Exercises  cabinet reaching 2# middle shelf x 15 , 5# using bilateral hands to place into lower and middle shelf       Shoulder Exercises: Pulleys   Flexion  2 minutes      Shoulder Exercises: ROM/Strengthening   UBE (Upper Arm Bike)  L1 forward 2 min, backward x 2 min       Shoulder Exercises: Dispensing optician Limitations  doorway    Cross Chest Stretch  3 reps    Other Shoulder Stretches  rhomboid stretch holding doorway       Modalities   Modalities  Cryotherapy      Cryotherapy   Number Minutes Cryotherapy  10 Minutes    Cryotherapy Location  --   posterior, scapular , left    Type of Cryotherapy  Ice pack      Manual Therapy   Manual therapy comments  soft tissue work, left periscap in seated                   PT Long Term Goals - 08/21/19 1022      PT LONG TERM GOAL #1   Title  Pt will be able to show I with HEP upon last visit for posture and L UE    Time  6    Period  Weeks    Status  On-going      PT LONG TERM GOAL #2   Title  Pt will be able to score no more than 32% impaired on FOTO to demo functional improvement    Baseline  3% improved    Time  6    Period  Weeks    Status  On-going      PT LONG TERM GOAL #3   Title  Pt will be able to drive and cook using her non dominant (L) hand with no increase in pain.    Time  6    Period  Weeks    Status  Achieved      PT LONG TERM  GOAL #4   Title  Pt will show 4/5 strength or more in shoulder flexion and abduction to maximize strength.    Baseline  4/5    Period  Weeks    Status  Achieved      PT LONG TERM GOAL #5   Title  Pt will understand posture and alignment, how it can affect neck and shoulder pain    Time  6    Period  Weeks    Status  On-going      PT LONG TERM GOAL #6   Title  Pt will be able to lift a stack of plates (bilateral UEs) to shoulder height or above without increase in pain    Baseline  5# overhead in clinic and reports no pain at hoome with this.    Time  2    Period  Weeks    Status  Achieved            Plan - 08/21/19 0739    Clinical Impression Statement  Pt arrives reporting increased soreness again after last session. Focused stretching and functional tasks as she is nearing end of POC. Her soreness in left periscap and seems to be delayed muscles soreness after scapular strengthening. Used ice today which she rpeorts is more beneficial. She reports improvement in all functional tasks however she continues to avoid using left arm which is her dominant side. She reports no pain with reaching into cabinets and is abel to left 5# into cabinets overhead in clinic without pain. FOTO score 41% limited. Most LTGS met. Will likely DC next session    PT Next Visit Plan  check reponse to last session, recheck goals ,  measure and likely DC    PT Home Exercise Plan  corner stretch, scap squeeze, supine AAROM flexion and ERyellow band, red band row, added supine horizontal and bilat  ER red band       Patient will benefit from skilled therapeutic intervention in order to improve the following deficits and impairments:  Decreased mobility, Improper body mechanics, Pain, Postural dysfunction, Impaired UE functional use, Impaired flexibility, Increased fascial restricitons, Decreased strength, Decreased range of motion  Visit Diagnosis: Abnormal posture  Acute pain of left shoulder  Muscle  weakness (generalized)  Other muscle spasm     Problem List Patient Active Problem List   Diagnosis Date Noted  . AC (acromioclavicular) arthritis 08/02/2019  . 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 Claudication ([place]) 04/19/2019  . Morbid obesity ([place]) 04/19/2019  . Suspected COVID-19 virus infection 04/13/2019  . Upper airway cough syndrome 08/22/2018  . Greater trochanteric bursitis of both hips 06/15/2018  . Trigger point of shoulder region, left 02/07/2018  . Hypertension   . Mild pulmonary hypertension ([place])   . NSVT (nonsustained ventricular tachycardia) ([place])   . PVC's (premature ventricular contractions)   . URI (upper respiratory infection) 08/09/2016  . Neck pain 02/10/2016  . Urinary incontinence 02/10/2016  . Degenerative arthritis of knee, bilateral 07/17/2015  . Knee MCL sprain 06/07/2015  . Discomfort in chest 02/15/2015  . Chest pain 02/15/2015  . DM type 2, controlled, with complication ([place])   . Wellness examination 08/06/2014  . Acute meniscal tear of knee 02/13/2014  . Primary localized osteoarthrosis, lower leg 02/13/2014  . Gastrocnemius tear 12/25/2013  . UTI (urinary tract infection) 12/20/2013  . Right knee pain 12/20/2013  . Pulmonary hypertension ([place]) 10/06/2013  . Other forms of dyspnea 08/04/2013  . Dysphagia, unspecified(787.20) 08/10/2012  . Hip pain, left 02/02/2012  . Painful respiration 05/25/2011  . Encounter for long-term (current) use of other medications 01/30/2011  . PELVIC PAIN, LEFT 07/30/2010  . TOBACCO USE, QUIT 07/07/2010  . FATIGUE 12/20/2009  . Headache 12/20/2009  . Low back pain 12/26/2008  . Disorder of liver 04/13/2008  . FOOT PAIN, BILATERAL 04/13/2008  . FIBROIDS, UTERUS 11/24/2007  . THYROID NODULE, LEFT 11/24/2007  . HYPERCHOLESTEROLEMIA 11/24/2007  . CARPAL TUNNEL SYNDROME, BILATERAL 11/24/2007  . ALKALINE PHOSPHATASE, ELEVATED 11/24/2007  . Gout 08/10/2007  . ANXIETY  08/10/2007  . Essential hypertension 08/10/2007  . Cough, persistent 08/01/2007  . Perennial and seasonal allergic rhinitis 01/14/2007  . GERD 01/14/2007  . Osteoporosis 01/14/2007    Dorene Ar, PTA 08/21/2019, 10:24 AM  [facility] [address] [place], [place], [address] Phone: [phone]   Fax:  [phone]  Name: Diannah Rindfleisch MRN: [id number] Date of Birth: 1946-06-15

## 2019-08-22 ENCOUNTER — Other Ambulatory Visit: Payer: Self-pay | Admitting: Endocrinology

## 2019-08-22 ENCOUNTER — Other Ambulatory Visit: Payer: Medicare Other

## 2019-08-22 DIAGNOSIS — I2510 Atherosclerotic heart disease of native coronary artery without angina pectoris: Secondary | ICD-10-CM | POA: Diagnosis not present

## 2019-08-22 DIAGNOSIS — E782 Mixed hyperlipidemia: Secondary | ICD-10-CM

## 2019-08-22 LAB — LIPID PANEL
Chol/HDL Ratio: 2.7 ratio (ref 0.0–4.4)
Cholesterol, Total: 139 mg/dL (ref 100–199)
HDL: 52 mg/dL (ref >39)
LDL Chol Calc (NIH): 68 mg/dL (ref 0–99)
Triglycerides: 106 mg/dL (ref 0–149)
VLDL Cholesterol Cal: 19 mg/dL (ref 5–40)

## 2019-08-22 LAB — COMPREHENSIVE METABOLIC PANEL
ALT: 20 IU/L (ref 0–32)
AST: 23 IU/L (ref 0–40)
Albumin/Globulin Ratio: 1.5 (ref 1.2–2.2)
Albumin: 4.1 g/dL (ref 3.7–4.7)
Alkaline Phosphatase: 93 IU/L (ref 39–117)
BUN/Creatinine Ratio: 8 — ABNORMAL LOW (ref 12–28)
BUN: 5 mg/dL — ABNORMAL LOW (ref 8–27)
Bilirubin Total: 0.3 mg/dL (ref 0.0–1.2)
CO2: 27 mmol/L (ref 20–29)
Calcium: 9.5 mg/dL (ref 8.7–10.3)
Chloride: 100 mmol/L (ref 96–106)
Creatinine, Ser: 0.6 mg/dL (ref 0.57–1.00)
GFR calc Af Amer: 104 mL/min/{1.73_m2} (ref >59)
GFR calc non Af Amer: 90 mL/min/{1.73_m2} (ref >59)
Globulin, Total: 2.7 g/dL (ref 1.5–4.5)
Glucose: 136 mg/dL — ABNORMAL HIGH (ref 65–99)
Potassium: 3.7 mmol/L (ref 3.5–5.2)
Sodium: 141 mmol/L (ref 134–144)
Total Protein: 6.8 g/dL (ref 6.0–8.5)

## 2019-08-23 ENCOUNTER — Encounter: Payer: Self-pay | Admitting: Physical Therapy

## 2019-08-23 ENCOUNTER — Other Ambulatory Visit: Payer: Self-pay

## 2019-08-23 ENCOUNTER — Ambulatory Visit: Payer: Medicare Other | Admitting: Physical Therapy

## 2019-08-23 DIAGNOSIS — M25512 Pain in left shoulder: Secondary | ICD-10-CM | POA: Diagnosis not present

## 2019-08-23 DIAGNOSIS — M6281 Muscle weakness (generalized): Secondary | ICD-10-CM

## 2019-08-23 DIAGNOSIS — R293 Abnormal posture: Secondary | ICD-10-CM | POA: Diagnosis not present

## 2019-08-23 DIAGNOSIS — M62838 Other muscle spasm: Secondary | ICD-10-CM

## 2019-08-23 DIAGNOSIS — M545 Low back pain, unspecified: Secondary | ICD-10-CM

## 2019-08-23 DIAGNOSIS — G8929 Other chronic pain: Secondary | ICD-10-CM

## 2019-08-23 NOTE — Therapy (Addendum)
[place], [place], [address] Phone: [phone]   Fax:  [phone]  Physical Therapy Treatment/Discharge  Patient Details  Name: Anikka Marsan MRN: [id number] Date of Birth: October 15, 1945 Referring Provider (PT): Dr. Lynne Leader    Encounter Date: 08/23/2019  PT End of Session - 08/23/19 0726    Visit Number  9    Number of Visits  12    Date for PT Re-Evaluation  08/23/19    PT Start Time  0720    PT Stop Time  0800    PT Time Calculation (min)  40 min       Past Medical History:  Diagnosis Date  . Alkaline phosphatase deficiency    w/u Ne  . Allergic rhinitis   . Anxiety   . Arthritis   . DM2 (diabetes mellitus, type 2) ([place])   . GERD (gastroesophageal reflux disease)   . Gout   . Hemorrhoids   . Hyperlipidemia   . Hypertension   . Mild pulmonary hypertension ([place])   . NSVT (nonsustained ventricular tachycardia) ([place])   . Osteoporosis   . PVC's (premature ventricular contractions)   . Thyroid nodule    small  . Tubular adenoma of colon 2017  . Urticaria   . Uterine fibroid     Past Surgical History:  Procedure Laterality Date  . CATARACT EXTRACTION Bilateral 2016  . COLONOSCOPY  2007  . echocardiogram (other)  01/16/2002  . 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 stress cardiolite  02/12/2006  . TOENAIL EXCISION    . TUBAL LIGATION    . TYMPANOSTOMY TUBE PLACEMENT      There were no vitals filed for this visit.  Subjective Assessment - 08/23/19 0722    Subjective  I feel good. today.    Currently in Pain?  No/denies         [facility] PT Assessment - 08/23/19 0001      AROM   Left Shoulder Flexion  135 Degrees    Left Shoulder ABduction  120 Degrees    Left Shoulder Internal Rotation  --   reaches T5   Left Shoulder External Rotation  --   reaches T1      Strength   Left Shoulder Flexion  4/5    Left Shoulder ABduction  4/5    Left Shoulder Internal Rotation  5/5     Left Shoulder External Rotation  4+/5                   [facility] Adult PT Treatment/Exercise - 08/23/19 0001      Shoulder Exercises: Supine   Other Supine Exercises  Cane AAROM: chest press, wide arms overhead x 10    horizontal abduction/adduction      Shoulder Exercises: Sidelying   External Rotation  10 reps    External Rotation Weight (lbs)  1      Shoulder Exercises: Standing   External Rotation  10 reps    Theraband Level (Shoulder External Rotation)  Level 2 (Red)    Internal Rotation  10 reps    Theraband Level (Shoulder Internal Rotation)  Level 2 (Red)    Extension  20 reps    Theraband Level (Shoulder Extension)  Level 2 (Red)    Row  20 reps    Theraband Level (Shoulder Row)  Level 2 (Red)    Other Standing Exercises  bicep curls 3# x 15, wall slides flexion and abduction  Other Standing Exercises  cabinet reaching 2# middle shelf x 15 , 5# using bilateral hands to place into lower and middle shelf       Shoulder Exercises: Pulleys   Flexion  2 minutes      Shoulder Exercises: ROM/Strengthening   UBE (Upper Arm Bike)  L1 forward 2 min, backward x 2 min       Shoulder Exercises: Dispensing optician Limitations  doorway    Cross Chest Stretch  3 reps      Cryotherapy   Number Minutes Cryotherapy  10 Minutes    Cryotherapy Location  --   posterior, scapular , left    Type of Cryotherapy  Ice pack                  PT Long Term Goals - 08/23/19 0740      PT LONG TERM GOAL #1   Title  Pt will be able to show I with HEP upon last visit for posture and L UE    Time  6    Period  Weeks    Status  Achieved      PT LONG TERM GOAL #2   Title  Pt will be able to score no more than 32% impaired on FOTO to demo functional improvement    Baseline  3% improved    Time  6    Period  Weeks    Status  Achieved      PT LONG TERM GOAL #3   Title  Pt will be able to drive and cook using her non dominant (L)  hand with no increase in pain.    Baseline  pain increases a little bit    Time  6    Period  Weeks    Status  Achieved      PT LONG TERM GOAL #4   Title  Pt will show 4/5 strength or more in shoulder flexion and abduction to maximize strength.    Baseline  4/5    Time  6    Period  Weeks    Status  Achieved      PT LONG TERM GOAL #5   Title  Pt will understand posture and alignment, how it can affect neck and shoulder pain    Time  6    Period  Weeks    Status  Achieved      PT LONG TERM GOAL #6   Title  Pt will be able to lift a stack of plates (bilateral UEs) to shoulder height or above without increase in pain    Baseline  5# overhead in clinic and reports no pain at home with this.    Time  2    Period  Weeks    Status  Achieved            Plan - 08/23/19 0726    Clinical Impression Statement  Pt reports going well today and less soreness. Reviewed and discharged to HEP. All LTGS met.    PT Next Visit Plan  discharge to HEP    PT Home Exercise Plan  corner stretch, scap squeeze, supine AAROM flexion and ERyellow band, red band row, added supine horizontal and bilat  ER red band       Patient will benefit from skilled therapeutic intervention in order to improve the following deficits and impairments:  Decreased mobility, Improper body mechanics, Pain, Postural dysfunction,  Impaired UE functional use, Impaired flexibility, Increased fascial restricitons, Decreased strength, Decreased range of motion  Visit Diagnosis: Abnormal posture  Acute pain of left shoulder  Muscle weakness (generalized)  Other muscle spasm  Chronic bilateral low back pain without sciatica     Problem List Patient Active Problem List   Diagnosis Date Noted  . AC (acromioclavicular) arthritis 08/02/2019  . 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 Claudication ([place]) 04/19/2019  . Morbid obesity ([place]) 04/19/2019  . Suspected COVID-19 virus  infection 04/13/2019  . Upper airway cough syndrome 08/22/2018  . Greater trochanteric bursitis of both hips 06/15/2018  . Trigger point of shoulder region, left 02/07/2018  . Hypertension   . Mild pulmonary hypertension ([place])   . NSVT (nonsustained ventricular tachycardia) ([place])   . PVC's (premature ventricular contractions)   . URI (upper respiratory infection) 08/09/2016  . Neck pain 02/10/2016  . Urinary incontinence 02/10/2016  . Degenerative arthritis of knee, bilateral 07/17/2015  . Knee MCL sprain 06/07/2015  . Discomfort in chest 02/15/2015  . Chest pain 02/15/2015  . DM type 2, controlled, with complication ([place])   . Wellness examination 08/06/2014  . Acute meniscal tear of knee 02/13/2014  . Primary localized osteoarthrosis, lower leg 02/13/2014  . Gastrocnemius tear 12/25/2013  . UTI (urinary tract infection) 12/20/2013  . Right knee pain 12/20/2013  . Pulmonary hypertension ([place]) 10/06/2013  . Other forms of dyspnea 08/04/2013  . Dysphagia, unspecified(787.20) 08/10/2012  . Hip pain, left 02/02/2012  . Painful respiration 05/25/2011  . Encounter for long-term (current) use of other medications 01/30/2011  . PELVIC PAIN, LEFT 07/30/2010  . TOBACCO USE, QUIT 07/07/2010  . FATIGUE 12/20/2009  . Headache 12/20/2009  . Low back pain 12/26/2008  . Disorder of liver 04/13/2008  . FOOT PAIN, BILATERAL 04/13/2008  . FIBROIDS, UTERUS 11/24/2007  . THYROID NODULE, LEFT 11/24/2007  . HYPERCHOLESTEROLEMIA 11/24/2007  . CARPAL TUNNEL SYNDROME, BILATERAL 11/24/2007  . ALKALINE PHOSPHATASE, ELEVATED 11/24/2007  . Gout 08/10/2007  . ANXIETY 08/10/2007  . Essential hypertension 08/10/2007  . Cough, persistent 08/01/2007  . Perennial and seasonal allergic rhinitis 01/14/2007  . GERD 01/14/2007  . Osteoporosis 01/14/2007    Dorene Ar, PTA 08/23/2019, 7:58 AM  [facility] [address] [place], [place],  [address] Phone: [phone]   Fax:  [phone]  Name: Laurenashley Viar MRN: [id number] Date of Birth: [birth year]  PHYSICAL THERAPY DISCHARGE SUMMARY  Visits from Start of Care: 9  Current functional level related to goals / functional outcomes: See above, all goals met    Remaining deficits: None limiting function   Education / Equipment: HEP, RICE, posture  Plan: Patient agrees to discharge.  Patient goals were met. Patient is being discharged due to meeting the stated rehab goals.  ?????    Raeford Razor, PT 08/24/19 12:16 PM Phone: [phone] Fax: [phone]

## 2019-08-27 ENCOUNTER — Other Ambulatory Visit: Payer: Self-pay | Admitting: Endocrinology

## 2019-08-29 ENCOUNTER — Other Ambulatory Visit: Payer: Self-pay

## 2019-08-29 ENCOUNTER — Ambulatory Visit (INDEPENDENT_AMBULATORY_CARE_PROVIDER_SITE_OTHER): Payer: Medicare Other | Admitting: Physician Assistant

## 2019-08-29 ENCOUNTER — Encounter: Payer: Self-pay | Admitting: Physician Assistant

## 2019-08-29 VITALS — BP 124/78 | HR 94 | Ht 64.5 in | Wt 159.6 lb

## 2019-08-29 DIAGNOSIS — I2510 Atherosclerotic heart disease of native coronary artery without angina pectoris: Secondary | ICD-10-CM

## 2019-08-29 DIAGNOSIS — E785 Hyperlipidemia, unspecified: Secondary | ICD-10-CM

## 2019-08-29 DIAGNOSIS — I10 Essential (primary) hypertension: Secondary | ICD-10-CM | POA: Diagnosis not present

## 2019-08-29 DIAGNOSIS — Z8679 Personal history of other diseases of the circulatory system: Secondary | ICD-10-CM | POA: Diagnosis not present

## 2019-08-29 NOTE — Patient Instructions (Signed)
Medication Instructions:   Your physician recommends that you continue on your current medications as directed. Please refer to the Current Medication list given to you today.  *If you need a refill on your cardiac medications before your next appointment, please call your pharmacy*   Lab Work: [place]   If you have labs (blood work) drawn today and your tests are completely normal, you will receive your results only by: Marland Kitchen MyChart Message (if you have MyChart) OR . A paper copy in the mail If you have any lab test that is abnormal or we need to change your treatment, we will call you to review the results.   Testing/Procedures: NONE ORDERED  TODAY  Follow-Up: At [facility], you and your health needs are our priority.  As part of our continuing mission to provide you with exceptional heart care, we have created designated Provider Care Teams.  These Care Teams include your primary Cardiologist (physician) and Advanced Practice Providers (APPs -  Physician Assistants and Nurse Practitioners) who all work together to provide you with the care you need, when you need it.  We recommend signing up for the patient portal called "MyChart".  Sign up information is provided on this After Visit Summary.  MyChart is used to connect with patients for Virtual Visits (Telemedicine).  Patients are able to view lab/test results, encounter notes, upcoming appointments, etc.  Non-urgent messages can be sent to your provider as well.   To learn more about what you can do with MyChart, go to [email or web address].    Your next appointment:   6 month(s)  The format for your next appointment:   In Person  Provider:   You may see Ena Dawley, MD or one of the following Advanced Practice Providers on your designated Care Team:    Melina Copa, PA-C  Ermalinda Barrios, PA-C   Other Instructions

## 2019-08-30 ENCOUNTER — Other Ambulatory Visit: Payer: Self-pay

## 2019-09-01 ENCOUNTER — Other Ambulatory Visit: Payer: Self-pay

## 2019-09-01 ENCOUNTER — Ambulatory Visit (INDEPENDENT_AMBULATORY_CARE_PROVIDER_SITE_OTHER): Payer: Medicare Other | Admitting: Endocrinology

## 2019-09-01 ENCOUNTER — Encounter: Payer: Self-pay | Admitting: Endocrinology

## 2019-09-01 VITALS — BP 124/82 | HR 80 | Ht 64.5 in | Wt 158.2 lb

## 2019-09-01 DIAGNOSIS — E118 Type 2 diabetes mellitus with unspecified complications: Secondary | ICD-10-CM | POA: Diagnosis not present

## 2019-09-01 LAB — POCT GLYCOSYLATED HEMOGLOBIN (HGB A1C): Hemoglobin A1C: 5.8 % — AB (ref 4.0–5.6)

## 2019-09-01 NOTE — Progress Notes (Signed)
Subjective:    Patient ID: Amy Escobar, female    DOB: 11/25/1945, 74 y.o.   MRN: [id number]  HPI Pt returns for f/u of diabetes mellitus: DM type: 2 Dx'ed: 0000000 Complications: CAD Therapy: metformin GDM: never.   DKA: never.   Severe hypoglycemia: never.   Pancreatitis: never.  Other: she has never been on insulin; she gets occasional steroid injections into the knees; she did not tolerate Invokana (vaginitis)  Interval history: pt states she takes metformin as rx'ed. She had 2 steroid injections last month; she brings a record of her cbg's which I have reviewed today.  cbg varies from 76-140. She stopped Invokana due to vaginitis Past Medical History:  Diagnosis Date  . Alkaline phosphatase deficiency    w/u Ne  . Allergic rhinitis   . Anxiety   . Arthritis   . DM2 (diabetes mellitus, type 2) ([place])   . GERD (gastroesophageal reflux disease)   . Gout   . Hemorrhoids   . Hyperlipidemia   . Hypertension   . Mild pulmonary hypertension ([place])   . NSVT (nonsustained ventricular tachycardia) ([place])   . Osteoporosis   . PVC's (premature ventricular contractions)   . Thyroid nodule    small  . Tubular adenoma of colon 2017  . Urticaria   . Uterine fibroid     Past Surgical History:  Procedure Laterality Date  . CATARACT EXTRACTION Bilateral 2016  . COLONOSCOPY  2007  . echocardiogram (other)  01/16/2002  . 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 stress cardiolite  02/12/2006  . TOENAIL EXCISION    . TUBAL LIGATION    . TYMPANOSTOMY TUBE PLACEMENT      Social History   Socioeconomic History  . Marital status: Widowed    Spouse name: Not on file  . Number of children: 2  . Years of education: Not on file  . Highest education level: Not on file  Occupational History  . Occupation: Surveyor, minerals: UNEMPLOYED  Tobacco Use  . Smoking status: Former Smoker    Packs/day: 0.25    Years: 5.00    Pack years: 1.25    Types: Cigarettes    Quit date:  06/29/1968    Years since quitting: 51.2  . Smokeless tobacco: Never Used  Substance and Sexual Activity  . Alcohol use: No    Alcohol/week: 0.0 standard drinks  . Drug use: No  . Sexual activity: Not on file  Other Topics Concern  . Not on file  Social History Narrative   Widowed 2010.    Social Determinants of Health   Financial Resource Strain:   . Difficulty of Paying Living Expenses: Not on file  Food Insecurity:   . Worried About Charity fundraiser in the Last Year: Not on file  . Ran Out of Food in the Last Year: Not on file  Transportation Needs:   . Lack of Transportation (Medical): Not on file  . Lack of Transportation (Non-Medical): Not on file  Physical Activity:   . Days of Exercise per Week: Not on file  . Minutes of Exercise per Session: Not on file  Stress:   . Feeling of Stress : Not on file  Social Connections:   . Frequency of Communication with Friends and Family: Not on file  . Frequency of Social Gatherings with Friends and Family: Not on file  . Attends Religious Services: Not on file  . Active Member of Clubs or Organizations:  Not on file  . Attends Archivist Meetings: Not on file  . Marital Status: Not on file  Intimate Partner Violence:   . Fear of Current or Ex-Partner: Not on file  . Emotionally Abused: Not on file  . Physically Abused: Not on file  . Sexually Abused: Not on file    Current Outpatient Medications on File Prior to Visit  Medication Sig Dispense Refill  . amLODipine (NORVASC) 2.5 MG tablet TAKE ONE TABLET BY MOUTH DAILY 90 tablet 0  . aspirin EC 81 MG tablet Take 81 mg by mouth daily.    . Azelastine-Fluticasone 137-50 MCG/ACT SUSP Place 1 spray into the nose 2 (two) times daily. 23 g 5  . benzonatate (TESSALON) 200 MG capsule Take 1 capsule (200 mg total) by mouth 3 (three) times daily as needed for cough. 45 capsule 2  . Carbinoxamine Maleate 4 MG TABS Take 1 tablet (4 mg total) by mouth every 8 (eight) hours as  needed. 56 tablet 5  . Chlorphen-Pseudoephed-APAP (CORICIDIN D PO) Take by mouth as needed.     . chlorpheniramine (CHLOR-TRIMETON) 4 MG tablet Take 4 mg by mouth every 4 (four) hours as needed for allergies.    Marland Kitchen esomeprazole (NEXIUM) 40 MG capsule TAKE ONE CAPSULE BY MOUTH 30 TO 60 MINUTES BEFORE YOUR FIRST AND LAST MEALS OF THE DAY 180 capsule 1  . fesoterodine (TOVIAZ) 4 MG TB24 tablet Take 4 mg daily by mouth.    . gabapentin (NEURONTIN) 600 MG tablet Take 600 mg by mouth 3 (three) times daily.    Marland Kitchen glucose blood (ONETOUCH VERIO) test strip 1 each by Other route daily. 100 each 12  . hydrocortisone 2.5 % lotion     . metFORMIN (GLUCOPHAGE-XR) 500 MG 24 hr tablet TAKE TWO TABLETS BY MOUTH TWICE A DAY 360 tablet 0  . Respiratory Therapy Supplies MISC Use flutter valve as needed to break the coughing cycle. 1 each 1  . rosuvastatin (CRESTOR) 5 MG tablet TAKE ONE TABLET BY MOUTH DAILY 90 tablet 3  . triamcinolone cream (KENALOG) 0.1 % APPLY 1 APPLICATION TOPICALLY 4 (FOUR) TIMES DAILY AS NEEDED FOR RASH 45 g 1  . UNKNOWN TO PATIENT Inject as directed every 6 (six) months. 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 valsartan (DIOVAN) 80 MG tablet TAKE ONE TABLET BY MOUTH DAILY 90 tablet 3  . Vitamin D, Ergocalciferol, (DRISDOL) 1.25 MG (50000 UT) CAPS capsule TAKE ONE CAPSULE BY MOUTH ONCE WEEKLY 12 capsule 0   No current facility-administered medications on file prior to visit.    Allergies  Allergen Reactions  . Olmesartan Medoxomil     REACTION: headache    Family History  Problem Relation Age of Onset  . Heart disease Mother   . Allergic rhinitis Mother   . Heart disease Father   . Heart disease Maternal Grandfather   . Rectal cancer Maternal Grandfather   . Stomach cancer Maternal Grandmother   . Colon cancer Neg Hx   . Breast cancer Neg Hx     BP 124/82 (BP Location: Left Arm, Patient Position: Sitting, Cuff Size: Large)   Pulse 80   Ht 5' 4.5" (1.638 m)   Wt  158 lb 3.2 oz (71.8 kg)   SpO2 96%   BMI 26.74 kg/m    Review of Systems Denies nausea    Objective:   Physical Exam VITAL SIGNS:  See vs page GENERAL: no distress Pulses: dorsalis pedis intact  bilat.   MSK: no deformity of the feet CV: no leg edema Skin:  no ulcer on the feet.  normal color and temp on the feet.   Neuro: sensation is intact to touch on the feet.    Lab Results  Component Value Date   HGBA1C 5.8 (A) 09/01/2019   Lab Results  Component Value Date   CREATININE 0.60 08/22/2019   BUN 5 (L) 08/22/2019   NA 141 08/22/2019   K 3.7 08/22/2019   CL 100 08/22/2019   CO2 27 08/22/2019        Assessment & Plan:  Type 2 DM: well-controlled Vaginitis, due to Invokana  Patient Instructions  Please continue the same metformin. Please come back for a follow-up appointment in 4-6 months.

## 2019-09-01 NOTE — Patient Instructions (Addendum)
Please continue the same metformin. Please come back for a follow-up appointment in 4-6 months.    

## 2019-09-05 ENCOUNTER — Ambulatory Visit: Payer: Medicare Other | Attending: Internal Medicine

## 2019-09-05 DIAGNOSIS — Z23 Encounter for immunization: Secondary | ICD-10-CM | POA: Insufficient documentation

## 2019-09-05 NOTE — Progress Notes (Signed)
   Covid-19 Vaccination Clinic  Name:  Amy Escobar    MRN: [id number] DOB: 1945-10-15  09/05/2019  Amy Escobar was observed post Covid-19 immunization for 15 minutes without incident. She was provided with Vaccine Information Sheet and instruction to access the V-Safe system.   Amy Escobar was instructed to call 911 with any severe reactions post vaccine: Marland Kitchen Difficulty breathing  . Swelling of face and throat  . A fast heartbeat  . A bad rash all over body  . Dizziness and weakness   Immunizations Administered    Name Date Dose VIS Date Route   Pfizer COVID-19 Vaccine 09/05/2019  9:06 AM 0.3 mL 06/09/2019 Intramuscular   Manufacturer: [place]   Lot: [id number]   [place]: [id number]

## 2019-09-06 ENCOUNTER — Ambulatory Visit: Payer: Medicare Other

## 2019-09-17 ENCOUNTER — Other Ambulatory Visit: Payer: Self-pay | Admitting: Family Medicine

## 2019-09-21 ENCOUNTER — Ambulatory Visit (INDEPENDENT_AMBULATORY_CARE_PROVIDER_SITE_OTHER): Payer: Medicare Other | Admitting: Gastroenterology

## 2019-09-21 ENCOUNTER — Other Ambulatory Visit: Payer: Self-pay

## 2019-09-21 ENCOUNTER — Encounter: Payer: Self-pay | Admitting: Gastroenterology

## 2019-09-21 VITALS — BP 110/66 | HR 86 | Temp 96.8°F | Ht 64.5 in | Wt 161.2 lb

## 2019-09-21 DIAGNOSIS — Z86010 Personal history of colonic polyps: Secondary | ICD-10-CM

## 2019-09-21 DIAGNOSIS — K219 Gastro-esophageal reflux disease without esophagitis: Secondary | ICD-10-CM | POA: Diagnosis not present

## 2019-09-21 NOTE — Progress Notes (Signed)
History of Present Illness: This is a 74 year old female referred by [name],* FNP for the evaluation of a chronic cough.  Her cough is felt to be due to GERD with LPR, sinus drainage and perennial and seasonal allergic rhinitis.  She generally has a dry cough.  Her cough has improved over the last several months on treatment for acid reflux, allergic rhinitis, sinus drainage.  Nexium 40 mg twice daily has been in place for several months.  She does not note any heartburn or dyspeptic symptoms.  She occasionally notes an urgent looser stool with certain foods but has no other digestive complaints.  EGD in March 2014 was normal. Denies weight loss, abdominal pain, constipation, change in stool caliber, melena, hematochezia, nausea, vomiting, dysphagia, chest pain.    Allergies  Allergen Reactions  . Olmesartan Medoxomil     REACTION: headache   Outpatient Medications Prior to Visit  Medication Sig Dispense Refill  . amLODipine (NORVASC) 2.5 MG tablet TAKE ONE TABLET BY MOUTH DAILY 90 tablet 0  . aspirin EC 81 MG tablet Take 81 mg by mouth daily.    . Azelastine-Fluticasone 137-50 MCG/ACT SUSP Place 1 spray into the nose 2 (two) times daily. 23 g 5  . benzonatate (TESSALON) 200 MG capsule Take 1 capsule (200 mg total) by mouth 3 (three) times daily as needed for cough. 45 capsule 2  . Carbinoxamine Maleate 4 MG TABS Take 1 tablet (4 mg total) by mouth every 8 (eight) hours as needed. 56 tablet 5  . Chlorphen-Pseudoephed-APAP (CORICIDIN D PO) Take by mouth as needed.     . chlorpheniramine (CHLOR-TRIMETON) 4 MG tablet Take 4 mg by mouth every 4 (four) hours as needed for allergies.    Marland Kitchen esomeprazole (NEXIUM) 40 MG capsule TAKE ONE CAPSULE BY MOUTH 30 TO 60 MINUTES BEFORE YOUR FIRST AND LAST MEALS OF THE DAY 180 capsule 1  . fesoterodine (TOVIAZ) 4 MG TB24 tablet Take 4 mg daily by mouth.    . gabapentin (NEURONTIN) 600 MG tablet Take 600 mg by mouth 3 (three) times daily.    Marland Kitchen  glucose blood (ONETOUCH VERIO) test strip 1 each by Other route daily. 100 each 12  . hydrocortisone 2.5 % lotion     . metFORMIN (GLUCOPHAGE-XR) 500 MG 24 hr tablet TAKE TWO TABLETS BY MOUTH TWICE A DAY 360 tablet 0  . Respiratory Therapy Supplies MISC Use flutter valve as needed to break the coughing cycle. 1 each 1  . rosuvastatin (CRESTOR) 5 MG tablet TAKE ONE TABLET BY MOUTH DAILY 90 tablet 3  . triamcinolone cream (KENALOG) 0.1 % APPLY 1 APPLICATION TOPICALLY 4 (FOUR) TIMES DAILY AS NEEDED FOR RASH 45 g 1  . UNKNOWN TO PATIENT Inject as directed every 6 (six) months. 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 valsartan (DIOVAN) 80 MG tablet TAKE ONE TABLET BY MOUTH DAILY 90 tablet 3  . Vitamin D, Ergocalciferol, (DRISDOL) 1.25 MG (50000 UNIT) CAPS capsule TAKE ONE CAPSULE BY MOUTH ONCE WEEKLY 12 capsule 0   No facility-administered medications prior to visit.   Past Medical History:  Diagnosis Date  . Alkaline phosphatase deficiency    w/u Ne  . Allergic rhinitis   . Anxiety   . Arthritis   . DM2 (diabetes mellitus, type 2) ([place])   . GERD (gastroesophageal reflux disease)   . Gout   . Hemorrhoids   . Hyperlipidemia   . Hypertension   .  Mild pulmonary hypertension ([place])   . NSVT (nonsustained ventricular tachycardia) ([place])   . Osteoporosis   . PVC's (premature ventricular contractions)   . Thyroid nodule    small  . Tubular adenoma of colon 2017  . Urticaria   . Uterine fibroid    Past Surgical History:  Procedure Laterality Date  . CATARACT EXTRACTION Bilateral 2016  . COLONOSCOPY  2007  . echocardiogram (other)  01/16/2002  . 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 stress cardiolite  02/12/2006  . TOENAIL EXCISION    . TUBAL LIGATION    . TYMPANOSTOMY TUBE PLACEMENT     Social History   Socioeconomic History  . Marital status: Widowed    Spouse name: Not on file  . Number of children: 2  . Years of education: Not on file  . Highest education  level: Not on file  Occupational History  . Occupation: Surveyor, minerals: UNEMPLOYED  Tobacco Use  . Smoking status: Former Smoker    Packs/day: 0.25    Years: 5.00    Pack years: 1.25    Types: Cigarettes    Quit date: 06/29/1968    Years since quitting: 51.2  . Smokeless tobacco: Never Used  Substance and Sexual Activity  . Alcohol use: No    Alcohol/week: 0.0 standard drinks  . Drug use: No  . Sexual activity: Not on file  Other Topics Concern  . Not on file  Social History Narrative   Widowed 2010.    Social Determinants of Health   Financial Resource Strain:   . Difficulty of Paying Living Expenses:   Food Insecurity:   . Worried About Charity fundraiser in the Last Year:   . Arboriculturist in the Last Year:   Transportation Needs:   . Film/video editor (Medical):   Marland Kitchen Lack of Transportation (Non-Medical):   Physical Activity:   . Days of Exercise per Week:   . Minutes of Exercise per Session:   Stress:   . Feeling of Stress :   Social Connections:   . Frequency of Communication with Friends and Family:   . Frequency of Social Gatherings with Friends and Family:   . Attends Religious Services:   . Active Member of Clubs or Organizations:   . Attends Archivist Meetings:   Marland Kitchen Marital Status:    Family History  Problem Relation Age of Onset  . Heart disease Mother   . Allergic rhinitis Mother   . Heart disease Father   . Heart disease Maternal Grandfather   . Rectal cancer Maternal Grandfather   . Stomach cancer Maternal Grandmother   . Colon cancer Neg Hx   . Breast cancer Neg Hx       Review of Systems: Pertinent positive and negative review of systems were noted in the above HPI section. All other review of systems were otherwise negative.   Physical Exam: General: Well developed, well nourished, no acute distress Head: Normocephalic and atraumatic Eyes:  sclerae anicteric, EOMI Ears: Normal auditory acuity Mouth: Not  examined, mask on during Covid-19 pandemic Neck: Supple, no masses or thyromegaly Lungs: Clear throughout to auscultation Heart: Regular rate and rhythm; no murmurs, rubs or bruits Abdomen: Soft, non tender and non distended. No masses, hepatosplenomegaly or hernias noted. Normal Bowel sounds Rectal: not done Musculoskeletal: Symmetrical with no gross deformities  Skin: No lesions on visible extremities Pulses:  Normal pulses noted Extremities: No clubbing, cyanosis, edema  or deformities noted Neurological: Alert oriented x 4, grossly nonfocal Cervical Nodes:  No significant cervical adenopathy Inguinal Nodes: No significant inguinal adenopathy Psychological:  Alert and cooperative. Normal mood and affect   Assessment and Recommendations:  1.  Multifactorial chronic cough with GERD and LPR potentially contributing.  We discussed intensifying all antireflux measures along with head of bed elevation.  Continue Nexium 40 mg twice daily taken before breakfast and dinner.  We discussed the option of repeat upper endoscopy however this would be low yield.  Follow-up with her allergist, Dr. Verlin Fester.  2. Personal history of multiple adenomatous colon polyps. Surveillance colonoscopy recommended in April 2022.    cc: [name], [place] [place],  [place] [address]

## 2019-09-21 NOTE — Patient Instructions (Signed)
Continue Nexium twice daily.  Patient advised to avoid spicy, acidic, citrus, chocolate, mints, fruit and fruit juices.  Limit the intake of caffeine, alcohol and Soda.  Don't exercise too soon after eating.  Don't lie down within 3-4 hours of eating.  Elevate the head of your bed.  You will be due for a recall colonoscopy in 09/2020. We will send you a reminder in the mail when it gets closer to that time.  Normal BMI (Body Mass Index- based on height and weight) is between 23 and 30. Your BMI today is Body mass index is 27.25 kg/m. Marland Kitchen Please consider follow up  regarding your BMI with your Primary Care Provider.  Thank you for choosing me and [facility].  Pricilla Riffle. Dagoberto Ligas., MD., [facility]

## 2019-09-25 ENCOUNTER — Ambulatory Visit: Payer: Medicare Other | Admitting: Allergy and Immunology

## 2019-09-27 ENCOUNTER — Other Ambulatory Visit: Payer: Self-pay

## 2019-09-27 ENCOUNTER — Encounter: Payer: Self-pay | Admitting: Family Medicine

## 2019-09-27 ENCOUNTER — Ambulatory Visit (INDEPENDENT_AMBULATORY_CARE_PROVIDER_SITE_OTHER): Payer: Medicare Other

## 2019-09-27 ENCOUNTER — Ambulatory Visit (INDEPENDENT_AMBULATORY_CARE_PROVIDER_SITE_OTHER): Payer: Medicare Other | Admitting: Family Medicine

## 2019-09-27 VITALS — BP 110/82 | HR 102 | Ht 64.5 in | Wt 163.0 lb

## 2019-09-27 DIAGNOSIS — M25552 Pain in left hip: Secondary | ICD-10-CM

## 2019-09-27 DIAGNOSIS — M7061 Trochanteric bursitis, right hip: Secondary | ICD-10-CM | POA: Diagnosis not present

## 2019-09-27 DIAGNOSIS — M25551 Pain in right hip: Secondary | ICD-10-CM

## 2019-09-27 DIAGNOSIS — M7062 Trochanteric bursitis, left hip: Secondary | ICD-10-CM | POA: Diagnosis not present

## 2019-09-27 DIAGNOSIS — M170 Bilateral primary osteoarthritis of knee: Secondary | ICD-10-CM

## 2019-09-27 DIAGNOSIS — M160 Bilateral primary osteoarthritis of hip: Secondary | ICD-10-CM | POA: Diagnosis not present

## 2019-09-27 NOTE — Patient Instructions (Signed)
Injected knees today Will get approval for gel See me again in 6 weeks

## 2019-09-27 NOTE — Progress Notes (Signed)
[place] Phone: [phone] Subjective:   [name], am serving as a scribe for Dr. Hulan Saas. This visit occurred during the SARS-CoV-2 public health emergency.  Safety protocols were in place, including screening questions prior to the visit, additional usage of staff PPE, and extensive cleaning of exam room while observing appropriate contact time as indicated for disinfecting solutions.    I'm seeing this patient by the request  of:  [name], FNP  CC: Bilateral knee pain, worsening recently.  [id number]  Amy Escobar is a 74 y.o. female coming in with complaint of bilateral knee pain. Patient states that she has been experiencing radiating pain up into the quad and IT bands all the way up to her hips.  Patient states that it is affecting daily activities.  Feels like she can only walk 200 feet before the pain gets more.  Some mild increase in instability.  Last viscosupplementation was in May.     Past Medical History:  Diagnosis Date  . Alkaline phosphatase deficiency    w/u Ne  . Allergic rhinitis   . Anxiety   . Arthritis   . DM2 (diabetes mellitus, type 2) ([place])   . GERD (gastroesophageal reflux disease)   . Gout   . Hemorrhoids   . Hyperlipidemia   . Hypertension   . Mild pulmonary hypertension ([place])   . NSVT (nonsustained ventricular tachycardia) ([place])   . Osteoporosis   . PVC's (premature ventricular contractions)   . Thyroid nodule    small  . Tubular adenoma of colon 2017  . Urticaria   . Uterine fibroid    Past Surgical History:  Procedure Laterality Date  . CATARACT EXTRACTION Bilateral 2016  . COLONOSCOPY  2007  . echocardiogram (other)  01/16/2002  . 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 stress cardiolite  02/12/2006  . TOENAIL EXCISION    . TUBAL LIGATION    . TYMPANOSTOMY TUBE PLACEMENT     Social History   Socioeconomic History  .  Marital status: Widowed    Spouse name: Not on file  . Number of children: 2  . Years of education: Not on file  . Highest education level: Not on file  Occupational History  . Occupation: Surveyor, minerals: UNEMPLOYED  Tobacco Use  . Smoking status: Former Smoker    Packs/day: 0.25    Years: 5.00    Pack years: 1.25    Types: Cigarettes    Quit date: 06/29/1968    Years since quitting: 51.2  . Smokeless tobacco: Never Used  Substance and Sexual Activity  . Alcohol use: No    Alcohol/week: 0.0 standard drinks  . Drug use: No  . Sexual activity: Not on file  Other Topics Concern  . Not on file  Social History Narrative   Widowed 2010.    Social Determinants of Health   Financial Resource Strain:   . Difficulty of Paying Living Expenses:   Food Insecurity:   . Worried About Charity fundraiser in the Last Year:   . Arboriculturist in the Last Year:   Transportation Needs:   . Film/video editor (Medical):   Marland Kitchen Lack of Transportation (Non-Medical):   Physical Activity:   . Days of Exercise per Week:   . Minutes of Exercise per Session:   Stress:   . Feeling of Stress :   Social  Connections:   . Frequency of Communication with Friends and Family:   . Frequency of Social Gatherings with Friends and Family:   . Attends Religious Services:   . Active Member of Clubs or Organizations:   . Attends Archivist Meetings:   Marland Kitchen Marital Status:    Allergies  Allergen Reactions  . Olmesartan Medoxomil     REACTION: headache   Family History  Problem Relation Age of Onset  . Heart disease Mother   . Allergic rhinitis Mother   . Heart disease Father   . Heart disease Maternal Grandfather   . Rectal cancer Maternal Grandfather   . Stomach cancer Maternal Grandmother   . Colon cancer Neg Hx   . Breast cancer Neg Hx     Current Outpatient Medications (Endocrine & Metabolic):  .  metFORMIN (GLUCOPHAGE-XR) 500 MG 24 hr tablet, TAKE TWO TABLETS BY MOUTH TWICE  A DAY  Current Outpatient Medications (Cardiovascular):  .  amLODipine (NORVASC) 2.5 MG tablet, TAKE ONE TABLET BY MOUTH DAILY .  rosuvastatin (CRESTOR) 5 MG tablet, TAKE ONE TABLET BY MOUTH DAILY .  valsartan (DIOVAN) 80 MG tablet, TAKE ONE TABLET BY MOUTH DAILY  Current Outpatient Medications (Respiratory):  Marland Kitchen  Azelastine-Fluticasone 137-50 MCG/ACT SUSP, Place 1 spray into the nose 2 (two) times daily. .  benzonatate (TESSALON) 200 MG capsule, Take 1 capsule (200 mg total) by mouth 3 (three) times daily as needed for cough. .  Carbinoxamine Maleate 4 MG TABS, Take 1 tablet (4 mg total) by mouth every 8 (eight) hours as needed. .  Chlorphen-Pseudoephed-APAP (CORICIDIN D PO), Take by mouth as needed.  .  chlorpheniramine (CHLOR-TRIMETON) 4 MG tablet, Take 4 mg by mouth every 4 (four) hours as needed for allergies.  Current Outpatient Medications (Analgesics):  .  aspirin EC 81 MG tablet, Take 81 mg by mouth daily.   Current Outpatient Medications (Other):  .  esomeprazole (NEXIUM) 40 MG capsule, TAKE ONE CAPSULE BY MOUTH 30 TO 60 MINUTES BEFORE YOUR FIRST AND LAST MEALS OF THE DAY .  fesoterodine (TOVIAZ) 4 MG TB24 tablet, Take 4 mg daily by mouth. .  gabapentin (NEURONTIN) 600 MG tablet, Take 600 mg by mouth 3 (three) times daily. Marland Kitchen  glucose blood (ONETOUCH VERIO) test strip, 1 each by Other route daily. .  hydrocortisone 2.5 % lotion,  .  Respiratory Therapy Supplies MISC, Use flutter valve as needed to break the coughing cycle. .  triamcinolone cream (KENALOG) 0.1 %, APPLY 1 APPLICATION TOPICALLY 4 (FOUR) TIMES DAILY AS NEEDED FOR RASH .  UNKNOWN TO PATIENT, Inject as directed every 6 (six) months. 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  Vitamin D, Ergocalciferol, (DRISDOL) 1.25 MG (50000 UNIT) CAPS capsule, TAKE ONE CAPSULE BY MOUTH ONCE WEEKLY   Reviewed prior external information including notes and imaging from  primary care provider As well as notes that were  available from care everywhere and other healthcare systems.  Past medical history, social, surgical and family history all reviewed in electronic medical record.  No pertanent information unless stated regarding to the chief complaint.   Review of Systems:  No headache, visual changes, nausea, vomiting, diarrhea, constipation, dizziness, abdominal pain, skin rash, fevers, chills, night sweats, weight loss, swollen lymph nodes, body aches,chest pain, shortness of breath, mood changes. POSITIVE muscle aches, joint swelling  Objective  Blood pressure 110/82, pulse (!) 102, height 5' 4.5" (1.638 m), weight 163 lb (73.9 kg), SpO2 98 %.   General: No  apparent distress alert and oriented x3 mood and affect normal, dressed appropriately.  HEENT: Pupils equal, extraocular movements intact  Respiratory: Patient's speak in full sentences and does not appear short of breath  Cardiovascular: No lower extremity edema, non tender, no erythema  Neuro: Cranial nerves II through XII are intact, neurovascularly intact in all extremities with 2+ DTRs and 2+ pulses.  Gait antalgic MSK:   Knee: Bilateral valgus deformity noted.  Abnormal thigh to calf ratio.  Tender to palpation over medial and PF joint line.  ROM full in flexion and extension and lower leg rotation. instability with valgus force.  painful patellar compression. Patellar glide with moderate crepitus. Patellar and quadriceps tendons unremarkable. Hamstring and quadriceps strength is normal.  After informed written and verbal consent, patient was seated on exam table. Right knee was prepped with alcohol swab and utilizing anterolateral approach, patient's right knee space was injected with 4:1  marcaine 0.5%: Kenalog 40mg /dL. Patient tolerated the procedure well without immediate complications.  After informed written and verbal consent, patient was seated on exam table. Left knee was prepped with alcohol swab and utilizing anterolateral  approach, patient's left knee space was injected with 4:1  marcaine 0.5%: Kenalog 40mg /dL. Patient tolerated the procedure well without immediate complications.   Impression and Recommendations:     This case required medical decision making of moderate complexity. The above documentation has been reviewed and is accurate and complete Lyndal Pulley, DO       Note: This dictation was prepared with Dragon dictation along with smaller phrase technology. Any transcriptional errors that result from this process are unintentional.

## 2019-09-27 NOTE — Assessment & Plan Note (Signed)
Chronic problem : With exacerbation  interventions previously, including medication management: Multiple injections including steroid, viscosupplementation, topical anti-inflammatories and oral anti-inflammatories   Interventions this visit: Injections given today.  Discussed medication management including gabapentin and to continue, topical anti-inflammatories We discussed with patient the importance ergonomics, home exercises, icing regimen, and over-the-counter natural products.   Future considerations but will be based on evaluation and next visit: Consider viscosupplementation again    Return to clinic: 4 to 6 weeks

## 2019-09-27 NOTE — Assessment & Plan Note (Signed)
History of hip pain and patient describes that a little bit more at this point.  Can do injections if necessary but I would like a pelvic x-ray to make sure there is no significant underlying arthritis that is contributing.

## 2019-10-17 ENCOUNTER — Telehealth: Payer: Self-pay | Admitting: Family

## 2019-10-17 MED ORDER — SULFAMETHOXAZOLE-TRIMETHOPRIM 800-160 MG PO TABS: 1.0000 | ORAL_TABLET | Freq: Two times a day (BID) | ORAL | 0 refills | Status: DC

## 2019-10-17 NOTE — Telephone Encounter (Signed)
New message:    Pt is calling and states she did a home UTI test and she needs some medication for her UTI sent to [facility] [address] [place]

## 2019-10-17 NOTE — Telephone Encounter (Signed)
Antibiotic sent; patient understands she has to be seen if symptoms persist.

## 2019-10-20 ENCOUNTER — Other Ambulatory Visit: Payer: Self-pay

## 2019-10-20 ENCOUNTER — Ambulatory Visit
Admission: RE | Admit: 2019-10-20 | Discharge: 2019-10-20 | Disposition: A | Discharge status: Home / Self Care | Payer: Medicare Other | Source: Ambulatory Visit | Attending: Family | Admitting: Family

## 2019-10-20 DIAGNOSIS — Z1231 Encounter for screening mammogram for malignant neoplasm of breast: Secondary | ICD-10-CM

## 2019-10-21 IMAGING — DX DG THORACIC SPINE 3V
3 series · 3 of 3 positions shown · non-contrast
Comparison: CT cardiac images of 11/18/2016 and chest x-ray of
09/12/2015

CLINICAL DATA: Left-sided neck and mid back pain over the last 3-4
months, no injury

EXAM:
THORACIC SPINE - 3 VIEWS

[t-spine ap]
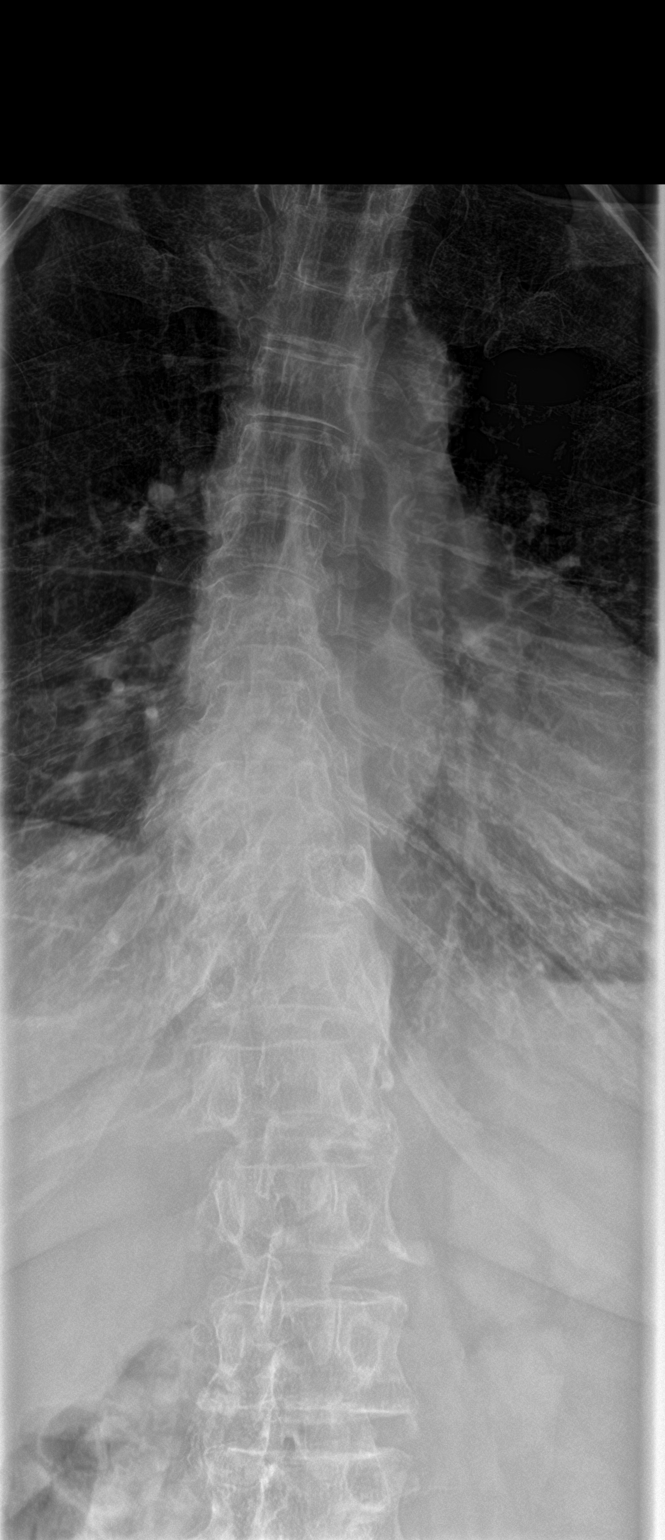

[t-spine lat]
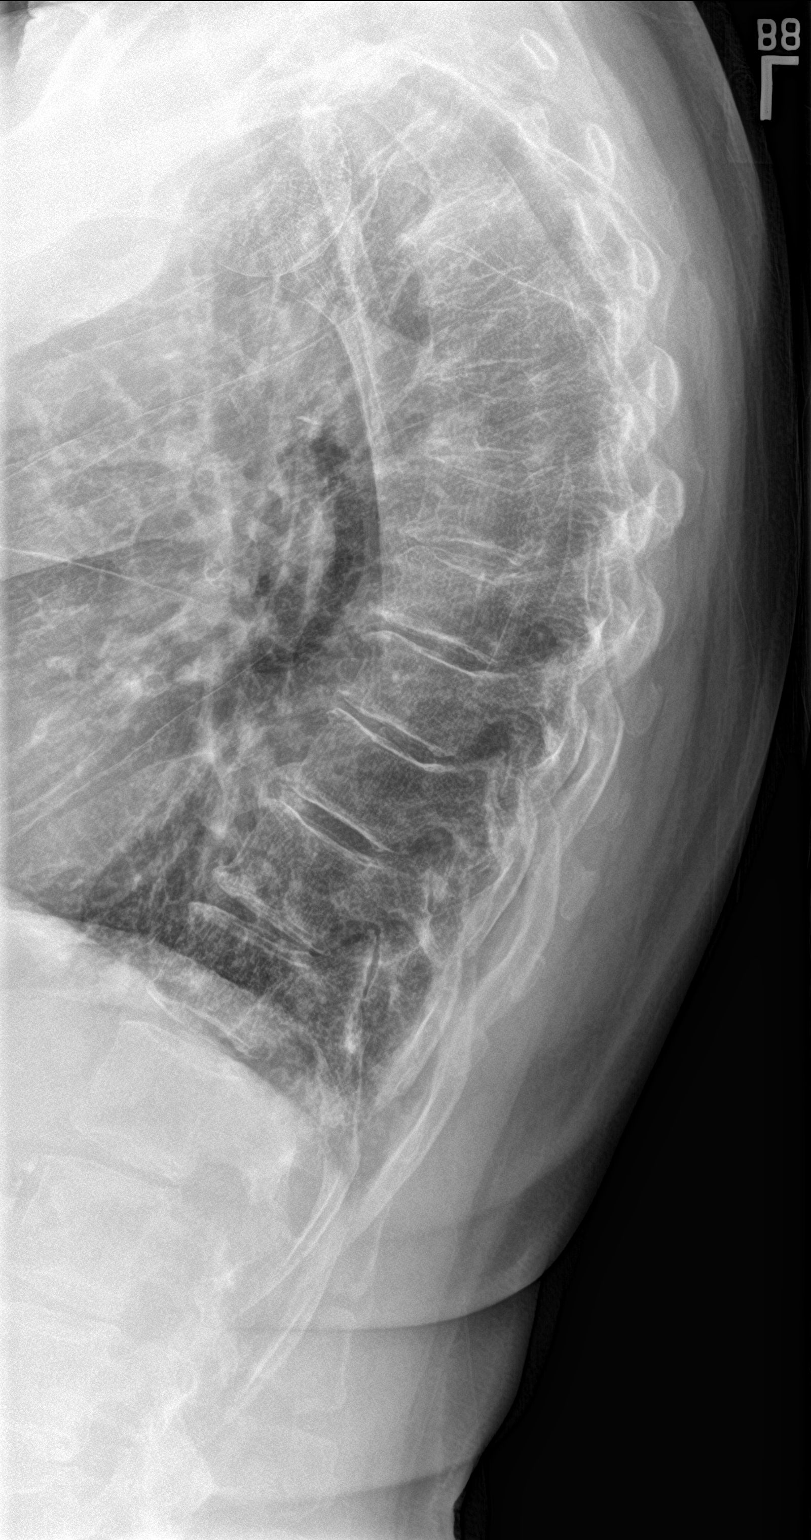

[swimmer]
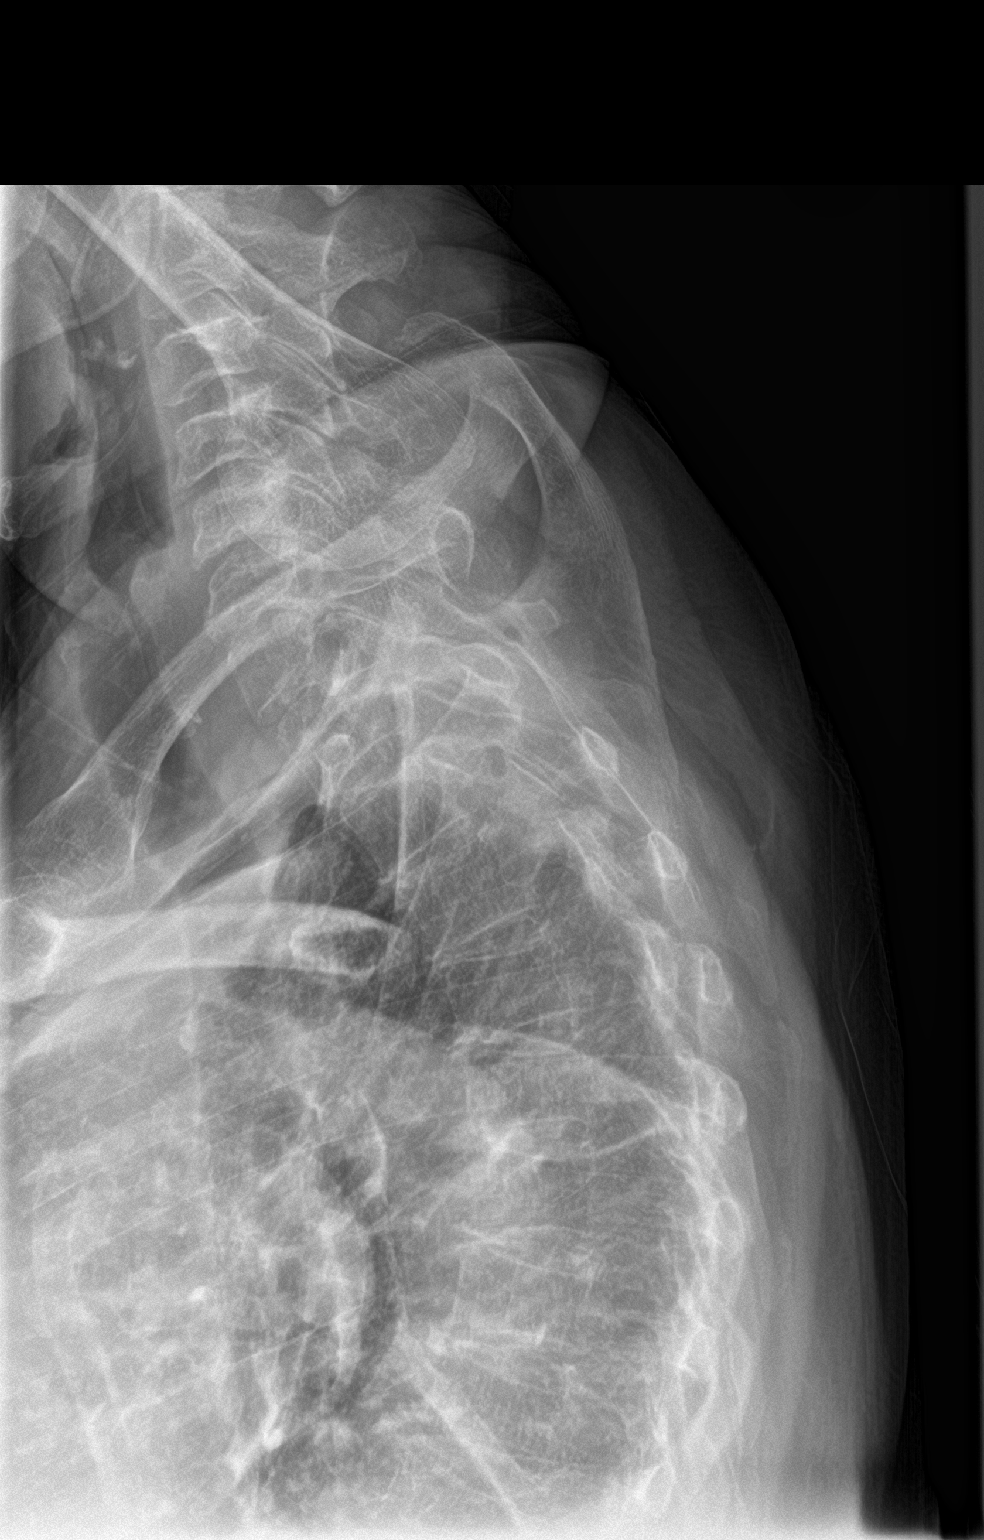

[3 of 3 positions shown; findings below may reference images not displayed]

FINDINGS: There is mild curvature of the mid lower thoracic spine slightly
convex to the right by approximately 11 degrees. The bones do appear
to be osteopenic. The thoracic vertebrae remain in normal alignment.
No compression deformity is seen. Only mild degenerative spurring is
present.
IMPRESSION: 1. Mild curvature of the mid lower thoracic spine convex to the
right by 11 degrees.
2. Osteopenia.  No fracture.  Mild degenerative change.

## 2019-10-23 ENCOUNTER — Encounter: Payer: Self-pay | Admitting: Allergy and Immunology

## 2019-10-23 ENCOUNTER — Other Ambulatory Visit: Payer: Self-pay

## 2019-10-23 ENCOUNTER — Ambulatory Visit (INDEPENDENT_AMBULATORY_CARE_PROVIDER_SITE_OTHER): Payer: Medicare Other | Admitting: Allergy and Immunology

## 2019-10-23 ENCOUNTER — Other Ambulatory Visit: Payer: Self-pay | Admitting: Internal Medicine

## 2019-10-23 DIAGNOSIS — R05 Cough: Secondary | ICD-10-CM

## 2019-10-23 DIAGNOSIS — R053 Chronic cough: Secondary | ICD-10-CM

## 2019-10-23 DIAGNOSIS — R058 Other specified cough: Secondary | ICD-10-CM

## 2019-10-23 DIAGNOSIS — J3089 Other allergic rhinitis: Secondary | ICD-10-CM

## 2019-10-23 DIAGNOSIS — K219 Gastro-esophageal reflux disease without esophagitis: Secondary | ICD-10-CM

## 2019-10-23 NOTE — Assessment & Plan Note (Signed)
   Continue appropriate reflux lifestyle modifications.  Continue esomeprazole (Nexium) 40 mg twice daily as prescribed.  Follow-up with your gastroenterologist as recommended.

## 2019-10-23 NOTE — Patient Instructions (Addendum)
Perennial and seasonal allergic rhinitis  Continue appropriate allergen avoidance measures, azelastine/fluticasone nasal spray 1-2 times daily as needed, and montelukast 10 mg daily at bedtime.  Nasal saline spray (i.e., Simply Saline) or nasal saline lavage (i.e., NeilMed) is recommended as needed and prior to medicated nasal sprays.  If allergen avoidance measures and medications fail to adequately relieve symptoms, aeroallergen immunotherapy will be considered.  GERD  Continue appropriate reflux lifestyle modifications.  Continue esomeprazole (Nexium) 40 mg twice daily as prescribed.  Follow-up with your gastroenterologist as recommended.  Cough, persistent Improved/resolved.  Continue treatment plan for allergic rhinitis and gastroesophageal reflux (as above).   Follow-up if needed.

## 2019-10-23 NOTE — Assessment & Plan Note (Signed)
Improved/resolved.  Continue treatment plan for allergic rhinitis and gastroesophageal reflux (as above).

## 2019-10-23 NOTE — Progress Notes (Signed)
Follow-up Note  RE: Amy Escobar MRN: [id number] DOB: [birth year] Date of Office Visit: 10/23/2019  Primary care provider: [name], [place] Referring provider: [name],*  History of present illness: Amy Escobar is a 74 y.o. female with perennial and seasonal allergic rhinitis, gastroesophageal reflux, and history of persistent cough presenting today for follow-up.  She reports that in the interval since her previous visit her cough has resolved.  She will occasionally have a mild cough for 1 or 2 days, however those episodes are infrequent.  She has been using azelastine/fluticasone nasal spray in the morning and taking montelukast 10 mg daily at bedtime.  She believes that the cough was primarily due to her allergies which are currently better controlled.  She continues to take Nexium 40 mg twice daily, as prescribed by her gastroenterologist, and modifying her diet.  Assessment and plan: Perennial and seasonal allergic rhinitis  Continue appropriate allergen avoidance measures, azelastine/fluticasone nasal spray 1-2 times daily as needed, and montelukast 10 mg daily at bedtime.  Nasal saline spray (i.e., Simply Saline) or nasal saline lavage (i.e., NeilMed) is recommended as needed and prior to medicated nasal sprays.  If allergen avoidance measures and medications fail to adequately relieve symptoms, aeroallergen immunotherapy will be considered.  GERD  Continue appropriate reflux lifestyle modifications.  Continue esomeprazole (Nexium) 40 mg twice daily as prescribed.  Follow-up with your gastroenterologist as recommended.  Cough, persistent Improved/resolved.  Continue treatment plan for allergic rhinitis and gastroesophageal reflux (as above).   Physical examination: Blood pressure 112/72, pulse 80, temperature 98.1 F (36.7 C), temperature source Temporal, resp. rate 18, SpO2 97 %.  General: Alert, interactive, in no acute  distress. HEENT: TMs pearly gray, turbinates minimally edematous without discharge, post-pharynx mildly erythematous. Neck: Supple without lymphadenopathy. Lungs: Clear to auscultation without wheezing, rhonchi or rales. CV: Normal S1, S2 without murmurs. Skin: Warm and dry, without lesions or rashes.  The following portions of the patient's history were reviewed and updated as appropriate: allergies, current medications, past family history, past medical history, past social history, past surgical history and problem list.  Current Outpatient Medications  Medication Sig Dispense Refill  . amLODipine (NORVASC) 2.5 MG tablet TAKE ONE TABLET BY MOUTH DAILY 90 tablet 0  . aspirin EC 81 MG tablet Take 81 mg by mouth daily.    . Azelastine-Fluticasone 137-50 MCG/ACT SUSP Place 1 spray into the nose 2 (two) times daily. 23 g 5  . benzonatate (TESSALON) 200 MG capsule Take 1 capsule (200 mg total) by mouth 3 (three) times daily as needed for cough. 45 capsule 2  . Carbinoxamine Maleate 4 MG TABS Take 1 tablet (4 mg total) by mouth every 8 (eight) hours as needed. 56 tablet 5  . Chlorphen-Pseudoephed-APAP (CORICIDIN D PO) Take by mouth as needed.     . chlorpheniramine (CHLOR-TRIMETON) 4 MG tablet Take 4 mg by mouth every 4 (four) hours as needed for allergies.    Marland Kitchen esomeprazole (NEXIUM) 40 MG capsule TAKE ONE CAPSULE BY MOUTH 30 TO 60 MINUTES BEFORE YOUR FIRST AND LAST MEALS OF THE DAY 180 capsule 1  . fesoterodine (TOVIAZ) 4 MG TB24 tablet Take 4 mg daily by mouth.    . gabapentin (NEURONTIN) 600 MG tablet Take 600 mg by mouth 3 (three) times daily.    Marland Kitchen glucose blood (ONETOUCH VERIO) test strip 1 each by Other route daily. 100 each 12  . hydrocortisone 2.5 % lotion     . metFORMIN (GLUCOPHAGE-XR)  500 MG 24 hr tablet TAKE TWO TABLETS BY MOUTH TWICE A DAY 360 tablet 0  . Respiratory Therapy Supplies MISC Use flutter valve as needed to break the coughing cycle. 1 each 1  . rosuvastatin (CRESTOR) 5  MG tablet TAKE ONE TABLET BY MOUTH DAILY 90 tablet 3  . sulfamethoxazole-trimethoprim (BACTRIM DS) 800-160 MG tablet Take 1 tablet by mouth 2 (two) times daily. 10 tablet 0  . triamcinolone cream (KENALOG) 0.1 % APPLY 1 APPLICATION TOPICALLY 4 (FOUR) TIMES DAILY AS NEEDED FOR RASH 45 g 1  . UNKNOWN TO PATIENT Inject as directed every 6 (six) months. 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 valsartan (DIOVAN) 80 MG tablet TAKE ONE TABLET BY MOUTH DAILY 90 tablet 3  . Vitamin D, Ergocalciferol, (DRISDOL) 1.25 MG (50000 UNIT) CAPS capsule TAKE ONE CAPSULE BY MOUTH ONCE WEEKLY 12 capsule 0   No current facility-administered medications for this visit.    Allergies  Allergen Reactions  . Olmesartan Medoxomil     REACTION: headache    I appreciate the opportunity to take part in [facility] care. Please do not hesitate to contact me with questions.  Sincerely,   R. Edgar Frisk, MD

## 2019-10-23 NOTE — Assessment & Plan Note (Signed)
   Continue appropriate allergen avoidance measures, azelastine/fluticasone nasal spray 1-2 times daily as needed, and montelukast 10 mg daily at bedtime.  Nasal saline spray (i.e., Simply Saline) or nasal saline lavage (i.e., NeilMed) is recommended as needed and prior to medicated nasal sprays.  If allergen avoidance measures and medications fail to adequately relieve symptoms, aeroallergen immunotherapy will be considered.

## 2019-10-24 ENCOUNTER — Telehealth: Payer: Self-pay | Admitting: Endocrinology

## 2019-10-24 ENCOUNTER — Other Ambulatory Visit: Payer: Self-pay

## 2019-10-24 DIAGNOSIS — E118 Type 2 diabetes mellitus with unspecified complications: Secondary | ICD-10-CM

## 2019-10-24 MED ORDER — ONETOUCH VERIO VI STRP: 1.0000 | ORAL_STRIP | Freq: Every day | 3 refills | Status: DC

## 2019-10-24 NOTE — Telephone Encounter (Signed)
Test strips sent to [facility]

## 2019-10-24 NOTE — Telephone Encounter (Signed)
Medication Refill Request  Did you call your pharmacy and request this refill first? Yes  . If patient has not contacted pharmacy first, instruct them to do so for future refills.  . Remind them that contacting the pharmacy for their refill is the quickest method to get the refill.  . Refill policy also stated that it will take anywhere between 24-72 hours to receive the refill.    Name of medication? One touch verio test strips  Is this a 90 day supply? yes  Name and location of pharmacy?  [facility] [address] [place] Phone:  [phone]  Fax:  [phone]       . Is the request for diabetes test strips? yes . If yes, what brand? One touch verio glucose

## 2019-10-27 ENCOUNTER — Telehealth: Payer: Self-pay | Admitting: Endocrinology

## 2019-10-27 DIAGNOSIS — E118 Type 2 diabetes mellitus with unspecified complications: Secondary | ICD-10-CM

## 2019-10-27 MED ORDER — ONETOUCH VERIO VI STRP: 1.0000 | ORAL_STRIP | Freq: Every day | 3 refills | Status: DC

## 2019-10-27 NOTE — Telephone Encounter (Signed)
Outpatient Medication Detail   Disp Refills Start End   glucose blood (ONETOUCH VERIO) test strip 100 each 3 10/27/2019    Sig - Route: 1 each by Other route daily. And lancets 1/day - Other   Sent to pharmacy as: glucose blood (ONETOUCH VERIO) test strip   E-Prescribing Status: Receipt confirmed by pharmacy (10/27/2019  3:38 PM EDT)

## 2019-10-27 NOTE — Telephone Encounter (Signed)
Pt only needs to test qd.  I wrote for 90d.

## 2019-10-27 NOTE — Telephone Encounter (Signed)
Please advise 

## 2019-10-27 NOTE — Telephone Encounter (Signed)
[employer] called to advise that RX as sent on 10/24/2019 was not approved.  RX needs to be re-written to reflect 90 day supply as testing 3x per day

## 2019-11-01 ENCOUNTER — Other Ambulatory Visit: Payer: Self-pay | Admitting: Cardiology

## 2019-11-01 DIAGNOSIS — I10 Essential (primary) hypertension: Secondary | ICD-10-CM

## 2019-11-01 DIAGNOSIS — E782 Mixed hyperlipidemia: Secondary | ICD-10-CM

## 2019-11-01 DIAGNOSIS — R0609 Other forms of dyspnea: Secondary | ICD-10-CM

## 2019-11-02 ENCOUNTER — Ambulatory Visit: Payer: Self-pay

## 2019-11-02 ENCOUNTER — Encounter: Payer: Self-pay | Admitting: Family Medicine

## 2019-11-02 ENCOUNTER — Other Ambulatory Visit: Payer: Self-pay

## 2019-11-02 ENCOUNTER — Ambulatory Visit (INDEPENDENT_AMBULATORY_CARE_PROVIDER_SITE_OTHER): Payer: Medicare Other | Admitting: Family Medicine

## 2019-11-02 VITALS — BP 122/80 | HR 92 | Ht 64.5 in | Wt 165.0 lb

## 2019-11-02 DIAGNOSIS — G8929 Other chronic pain: Secondary | ICD-10-CM

## 2019-11-02 DIAGNOSIS — M25562 Pain in left knee: Secondary | ICD-10-CM

## 2019-11-02 DIAGNOSIS — M170 Bilateral primary osteoarthritis of knee: Secondary | ICD-10-CM | POA: Diagnosis not present

## 2019-11-02 NOTE — Patient Instructions (Signed)
You had knee injections today.  Call or go to the ER if you develop a large red swollen joint with extreme pain or oozing puss.

## 2019-11-02 NOTE — Progress Notes (Signed)
I, Amy Escobar, LAT, ATC, am serving as scribe for Dr. Lynne Leader.  Amy Escobar is a 74 y.o. female who presents to [place] at [facility] today for f/u of B knee pain.  She was last seen by Dr. Tamala Julian on 09/27/19 and had B knee injections.  She has been approved for Durolane.  Since her last visit, pt reports she is in pain. Here for Duralone injection. States the pain radiates into the hamstrings.     Exam:  BP 122/80 (BP Location: Left Arm, Patient Position: Sitting)   Pulse 92   Ht 5' 4.5" (1.638 m)   Wt 165 lb (74.8 kg)   SpO2 98%   BMI 27.88 kg/m  General: Well Developed, well nourished, and in no acute distress.   MSK: Knees bilaterally no erythema.  Minimal to trace joint effusions bilaterally.  Procedure: Real-time Ultrasound Guided Injection of right knee Durolane Device: Philips Affiniti 50G Images permanently stored and available for review in the ultrasound unit. Verbal informed consent obtained.  Discussed risks and benefits of procedure. Warned about infection bleeding damage to structures skin hypopigmentation and fat atrophy among others. Patient expresses understanding and agreement Time-out conducted.   Noted no overlying erythema, induration, or other signs of local infection.   Skin prepped in a sterile fashion.   Local anesthesia: Topical Ethyl chloride.   With sterile technique and under real time ultrasound guidance:  Durolane injected easily.   Completed without difficulty     Advised to call if fevers/chills, erythema, induration, drainage, or persistent bleeding.   Images permanently stored and available for review in the ultrasound unit.  Impression: Technically successful ultrasound guided injection.     Procedure: Real-time Ultrasound Guided Injection of left knee Durolane Device: Philips Affiniti 50G Images permanently stored and available for review in the ultrasound unit. Verbal informed consent obtained.   Discussed risks and benefits of procedure. Warned about infection bleeding damage to structures skin hypopigmentation and fat atrophy among others. Patient expresses understanding and agreement Time-out conducted.   Noted no overlying erythema, induration, or other signs of local infection.   Skin prepped in a sterile fashion.   Local anesthesia: Topical Ethyl chloride.   With sterile technique and under real time ultrasound guidance:  Durolane injected easily.   Completed without difficulty     Advised to call if fevers/chills, erythema, induration, drainage, or persistent bleeding.   Images permanently stored and available for review in the ultrasound unit.  Impression: Technically successful ultrasound guided injection.   Lot numbers [id number] and [id number]   Assessment and Plan: 74 y.o. 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  Durolane injections bilaterally recheck as needed.   PDMP not reviewed this encounter. Orders Placed This Encounter  Procedures  . [place] LIMITED JOINT SPACE STRUCTURES LOW BILAT(NO LINKED CHARGES)    Order Specific Question:   Reason for Exam (SYMPTOM  OR DIAGNOSIS REQUIRED)    Answer:   bilateral knee pain    Order Specific Question:   Preferred imaging location?    Answer:   [place]  . [place] LIMITED JOINT SPACE STRUCTURES LOW BILAT(NO LINKED CHARGES)    Order Specific Question:   Reason for Exam (SYMPTOM  OR DIAGNOSIS REQUIRED)    Answer:   knee inj    Order Specific Question:   Preferred imaging location?    Answer:   [place]   No orders of the defined types were placed  in this encounter.    Discussed warning signs or symptoms. Please see discharge instructions. Patient expresses understanding.   The above documentation has been reviewed and is accurate and complete Lynne Leader

## 2019-11-08 ENCOUNTER — Telehealth: Payer: Self-pay | Admitting: Cardiology

## 2019-11-08 DIAGNOSIS — I2510 Atherosclerotic heart disease of native coronary artery without angina pectoris: Secondary | ICD-10-CM

## 2019-11-08 DIAGNOSIS — E7800 Pure hypercholesterolemia, unspecified: Secondary | ICD-10-CM

## 2019-11-08 DIAGNOSIS — E785 Hyperlipidemia, unspecified: Secondary | ICD-10-CM

## 2019-11-08 DIAGNOSIS — E782 Mixed hyperlipidemia: Secondary | ICD-10-CM

## 2019-11-08 DIAGNOSIS — I4729 Other ventricular tachycardia: Secondary | ICD-10-CM

## 2019-11-08 DIAGNOSIS — I10 Essential (primary) hypertension: Secondary | ICD-10-CM

## 2019-11-08 NOTE — Telephone Encounter (Signed)
   Pt said she usually get blood work before seeing Dr. Meda Coffee. No orders on file. Pt wants to ask Dr. Meda Coffee

## 2019-11-08 NOTE — Telephone Encounter (Signed)
Lab orders placed for pt to have a CMET, CBC, TSH, and LIPIDS drawn on 02/19/20, prior to her OV with Dr. Meda Coffee on 02/26/20.  Pt made aware of lab appt date and time.  Pt is aware to come fasting to this lab appt.  Pt verbalized understanding and agrees with this plan. Pt was more than gracious for all the assistance provided.

## 2019-11-15 ENCOUNTER — Ambulatory Visit: Payer: Medicare Other | Admitting: Family Medicine

## 2019-11-20 ENCOUNTER — Other Ambulatory Visit: Payer: Self-pay | Admitting: Family

## 2019-12-11 ENCOUNTER — Other Ambulatory Visit: Payer: Self-pay | Admitting: Family Medicine

## 2019-12-12 ENCOUNTER — Other Ambulatory Visit: Payer: Self-pay

## 2019-12-12 DIAGNOSIS — E118 Type 2 diabetes mellitus with unspecified complications: Secondary | ICD-10-CM

## 2019-12-12 MED ORDER — ONETOUCH VERIO VI STRP: ORAL_STRIP | 2 refills | Status: DC

## 2019-12-13 ENCOUNTER — Telehealth: Payer: Self-pay

## 2019-12-13 ENCOUNTER — Telehealth: Payer: Self-pay | Admitting: Endocrinology

## 2019-12-13 NOTE — Telephone Encounter (Signed)
Please review and advise.

## 2019-12-13 NOTE — Telephone Encounter (Signed)
I do not know the cause if your sxs, but we do not rx these here.

## 2019-12-13 NOTE — Telephone Encounter (Signed)
New  Message    The patient voiced  C/o feet / ankles/ legs are swollen feels like pins / needles   The patient called Dr. Loanne Drilling office who advises her to go to ED   The patient is aware the call will be transfer over to [facility] triage for assessment, patient verbalized  Call / Transfer to [facility]

## 2019-12-13 NOTE — Telephone Encounter (Signed)
Patient requests to be called at ph# [phone] re: Patient's Neuropathy is getting worse. Patient is not sure if it is Neuropathy. Patient states she is swollen and feels like she is being stuck by needles.

## 2019-12-14 ENCOUNTER — Ambulatory Visit: Payer: Medicare Other | Admitting: Internal Medicine

## 2019-12-14 NOTE — Telephone Encounter (Signed)
Called pt and informed about Dr. Cordelia Pen response. Advised to follow up with PCP to determine if she may need to be seen by a specialist. Verbalized acceptance and understanding.

## 2019-12-15 ENCOUNTER — Ambulatory Visit (INDEPENDENT_AMBULATORY_CARE_PROVIDER_SITE_OTHER): Payer: Medicare Other | Admitting: Family

## 2019-12-15 ENCOUNTER — Other Ambulatory Visit: Payer: Self-pay

## 2019-12-15 VITALS — BP 126/74 | HR 98 | Temp 98.1°F | Ht 64.5 in | Wt 161.4 lb

## 2019-12-15 DIAGNOSIS — M79604 Pain in right leg: Secondary | ICD-10-CM

## 2019-12-15 DIAGNOSIS — R202 Paresthesia of skin: Secondary | ICD-10-CM

## 2019-12-15 DIAGNOSIS — E118 Type 2 diabetes mellitus with unspecified complications: Secondary | ICD-10-CM | POA: Diagnosis not present

## 2019-12-15 DIAGNOSIS — R200 Anesthesia of skin: Secondary | ICD-10-CM

## 2019-12-15 DIAGNOSIS — M79605 Pain in left leg: Secondary | ICD-10-CM | POA: Diagnosis not present

## 2019-12-15 LAB — COMPREHENSIVE METABOLIC PANEL
ALT: 22 U/L (ref 0–35)
AST: 25 U/L (ref 0–37)
Albumin: 4.1 g/dL (ref 3.5–5.2)
Alkaline Phosphatase: 116 U/L (ref 39–117)
BUN: 14 mg/dL (ref 6–23)
CO2: 29 mEq/L (ref 19–32)
Calcium: 9.7 mg/dL (ref 8.4–10.5)
Chloride: 105 mEq/L (ref 96–112)
Creatinine, Ser: 0.81 mg/dL (ref 0.40–1.20)
GFR: 83.54 mL/min (ref >60.00)
Glucose, Bld: 146 mg/dL — ABNORMAL HIGH (ref 70–99)
Potassium: 3.4 mEq/L — ABNORMAL LOW (ref 3.5–5.1)
Sodium: 139 mEq/L (ref 135–145)
Total Bilirubin: 0.3 mg/dL (ref 0.2–1.2)
Total Protein: 7.5 g/dL (ref 6.0–8.3)

## 2019-12-15 LAB — CBC WITH DIFFERENTIAL/PLATELET
Basophils Absolute: 0 10*3/uL (ref 0.0–0.1)
Basophils Relative: 0.6 % (ref 0.0–3.0)
Eosinophils Absolute: 0.2 10*3/uL (ref 0.0–0.7)
Eosinophils Relative: 2.7 % (ref 0.0–5.0)
HCT: 36.9 % (ref 36.0–46.0)
Hemoglobin: 12 g/dL (ref 12.0–15.0)
Lymphocytes Relative: 38.3 % (ref 12.0–46.0)
Lymphs Abs: 3 10*3/uL (ref 0.7–4.0)
MCHC: 32.4 g/dL (ref 30.0–36.0)
MCV: 85.5 fl (ref 78.0–100.0)
Monocytes Absolute: 0.7 10*3/uL (ref 0.1–1.0)
Monocytes Relative: 9.6 % (ref 3.0–12.0)
Neutro Abs: 3.8 10*3/uL (ref 1.4–7.7)
Neutrophils Relative %: 48.8 % (ref 43.0–77.0)
Platelets: 196 10*3/uL (ref 150.0–400.0)
RBC: 4.32 Mil/uL (ref 3.87–5.11)
RDW: 13.8 % (ref 11.5–15.5)
WBC: 7.8 10*3/uL (ref 4.0–10.5)

## 2019-12-15 LAB — VITAMIN B12: Vitamin B-12: 418 pg/mL (ref 211–911)

## 2019-12-15 LAB — BRAIN NATRIURETIC PEPTIDE: Pro B Natriuretic peptide (BNP): 33 pg/mL (ref 0.0–100.0)

## 2019-12-15 LAB — MAGNESIUM: Magnesium: 1.9 mg/dL (ref 1.5–2.5)

## 2019-12-15 MED ORDER — DULOXETINE HCL 30 MG PO CPEP: 30.0000 mg | ORAL_CAPSULE | Freq: Every day | ORAL | 3 refills | Status: DC

## 2019-12-15 NOTE — Progress Notes (Signed)
Amy Escobar is a 74 y.o. female with the following history as recorded in EpicCare:  Patient Active Problem List   Diagnosis Date Noted  . AC (acromioclavicular) arthritis 08/02/2019  . 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 Claudication ([place]) 04/19/2019  . Morbid obesity ([place]) 04/19/2019  . Suspected COVID-19 virus infection 04/13/2019  . Upper airway cough syndrome 08/22/2018  . Greater trochanteric bursitis of both hips 06/15/2018  . Trigger point of shoulder region, left 02/07/2018  . Hypertension   . Mild pulmonary hypertension ([place])   . NSVT (nonsustained ventricular tachycardia) ([place])   . PVC's (premature ventricular contractions)   . URI (upper respiratory infection) 08/09/2016  . Neck pain 02/10/2016  . Urinary incontinence 02/10/2016  . Degenerative arthritis of knee, bilateral 07/17/2015  . Knee MCL sprain 06/07/2015  . Discomfort in chest 02/15/2015  . Chest pain 02/15/2015  . DM type 2, controlled, with complication ([place])   . Wellness examination 08/06/2014  . Acute meniscal tear of knee 02/13/2014  . Primary localized osteoarthrosis, lower leg 02/13/2014  . Gastrocnemius tear 12/25/2013  . UTI (urinary tract infection) 12/20/2013  . Right knee pain 12/20/2013  . Pulmonary hypertension ([place]) 10/06/2013  . Other forms of dyspnea 08/04/2013  . Dysphagia, unspecified(787.20) 08/10/2012  . Hip pain, left 02/02/2012  . Painful respiration 05/25/2011  . Encounter for long-term (current) use of other medications 01/30/2011  . PELVIC PAIN, LEFT 07/30/2010  . TOBACCO USE, QUIT 07/07/2010  . FATIGUE 12/20/2009  . Headache 12/20/2009  . Low back pain 12/26/2008  . Disorder of liver 04/13/2008  . FOOT PAIN, BILATERAL 04/13/2008  . FIBROIDS, UTERUS 11/24/2007  . THYROID NODULE, LEFT 11/24/2007  . HYPERCHOLESTEROLEMIA 11/24/2007  . CARPAL TUNNEL SYNDROME, BILATERAL 11/24/2007  . ALKALINE PHOSPHATASE, ELEVATED 11/24/2007  . Gout  08/10/2007  . ANXIETY 08/10/2007  . Essential hypertension 08/10/2007  . Cough, persistent 08/01/2007  . Perennial and seasonal allergic rhinitis 01/14/2007  . GERD 01/14/2007  . Osteoporosis 01/14/2007    Current Outpatient Medications  Medication Sig Dispense Refill  . amLODipine (NORVASC) 2.5 MG tablet TAKE ONE TABLET BY MOUTH DAILY 90 tablet 0  . aspirin EC 81 MG tablet Take 81 mg by mouth daily.    . Azelastine-Fluticasone 137-50 MCG/ACT SUSP Place 1 spray into the nose 2 (two) times daily. 23 g 5  . benzonatate (TESSALON) 200 MG capsule Take 1 capsule (200 mg total) by mouth 3 (three) times daily as needed for cough. 45 capsule 2  . Carbinoxamine Maleate 4 MG TABS Take 1 tablet (4 mg total) by mouth every 8 (eight) hours as needed. 56 tablet 5  . Chlorphen-Pseudoephed-APAP (CORICIDIN D PO) Take by mouth as needed.     . chlorpheniramine (CHLOR-TRIMETON) 4 MG tablet Take 4 mg by mouth every 4 (four) hours as needed for allergies.    Marland Kitchen esomeprazole (NEXIUM) 40 MG capsule TAKE ONE CAPSULE BY MOUTH DAILY 30 TO 60 MINUTES BEFORE YOUR FIRST AND LAST MEALS OF THE DAY 180 capsule 0  . fesoterodine (TOVIAZ) 4 MG TB24 tablet Take 4 mg daily by mouth.    . gabapentin (NEURONTIN) 600 MG tablet TAKE ONE TABLET BY MOUTH THREE TIMES A DAY (Patient taking differently: Take 600 mg by mouth at bedtime. Take) 270 tablet 2  . glucose blood (ONETOUCH VERIO) test strip Use to check blood sugar 2 times a day. 200 each 2  . hydrocortisone 2.5 % lotion     . metFORMIN (GLUCOPHAGE-XR) 500 MG 24  hr tablet TAKE TWO TABLETS BY MOUTH TWICE A DAY 360 tablet 0  . Olopatadine HCl 0.2 % SOLN     . Respiratory Therapy Supplies MISC Use flutter valve as needed to break the coughing cycle. 1 each 1  . rosuvastatin (CRESTOR) 5 MG tablet TAKE ONE TABLET BY MOUTH DAILY 90 tablet 3  . sulfamethoxazole-trimethoprim (BACTRIM DS) 800-160 MG tablet Take 1 tablet by mouth 2 (two) times daily. 10 tablet 0  . triamcinolone cream  (KENALOG) 0.1 % APPLY 1 APPLICATION TOPICALLY 4 (FOUR) TIMES DAILY AS NEEDED FOR RASH 45 g 1  . UNKNOWN TO PATIENT Inject as directed every 6 (six) months. 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 valsartan (DIOVAN) 80 MG tablet TAKE ONE TABLET BY MOUTH DAILY 90 tablet 3  . Vitamin D, Ergocalciferol, (DRISDOL) 1.25 MG (50000 UNIT) CAPS capsule TAKE 1 CAPSULE BY MOUTH ONCE WEEKLY 12 capsule 0  . DULoxetine (CYMBALTA) 30 MG capsule Take 1 capsule (30 mg total) by mouth daily. 30 capsule 3   No current facility-administered medications for this visit.    Allergies: Olmesartan medoxomil  Past Medical History:  Diagnosis Date  . Alkaline phosphatase deficiency    w/u Ne  . Allergic rhinitis   . Anxiety   . Arthritis   . DM2 (diabetes mellitus, type 2) ([place])   . GERD (gastroesophageal reflux disease)   . Gout   . Hemorrhoids   . Hyperlipidemia   . Hypertension   . Mild pulmonary hypertension ([place])   . NSVT (nonsustained ventricular tachycardia) ([place])   . Osteoporosis   . PVC's (premature ventricular contractions)   . Thyroid nodule    small  . Tubular adenoma of colon 2017  . Urticaria   . Uterine fibroid     Past Surgical History:  Procedure Laterality Date  . CATARACT EXTRACTION Bilateral 2016  . COLONOSCOPY  2007  . echocardiogram (other)  01/16/2002  . 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 stress cardiolite  02/12/2006  . TOENAIL EXCISION    . TUBAL LIGATION    . TYMPANOSTOMY TUBE PLACEMENT      Family History  Problem Relation Age of Onset  . Heart disease Mother   . Allergic rhinitis Mother   . Heart disease Father   . Heart disease Maternal Grandfather   . Rectal cancer Maternal Grandfather   . Stomach cancer Maternal Grandmother   . Colon cancer Neg Hx   . Breast cancer Neg Hx     Social History   Tobacco Use  . Smoking status: Former Smoker    Packs/day: 0.25    Years: 5.00    Pack years: 1.25    Types: Cigarettes    Quit date: 06/29/1968     Years since quitting: 51.4  . Smokeless tobacco: Never Used  Substance Use Topics  . Alcohol use: No    Alcohol/week: 0.0 standard drinks    Subjective:  Wants to discuss options for treating her neuropathy; cannot tolerate the Gabapentin tid- it makes her too sleepy; feels like burning/ pins and needles in her legs; has never had vascular consult; Requesting referral to podiatrist to discuss diabetic shoes;   Objective:  Vitals:   12/15/19 1540  BP: 126/74  Pulse: 98  Temp: 98.1 F (36.7 C)  TempSrc: Oral  SpO2: 99%  Weight: 161 lb 6.4 oz (73.2 kg)  Height: 5' 4.5" (1.638 m)    General: Well developed, well nourished, in no  acute distress  Skin : Warm and dry.  Head: Normocephalic and atraumatic  Lungs: Respirations unlabored; clear to auscultation bilaterally without wheeze, rales, rhonchi  CVS exam: normal rate and regular rhythm.  Musculoskeletal: No deformities; no active joint inflammation  Extremities: No edema, cyanosis, clubbing  Vessels: Symmetric bilaterally  Neurologic: Alert and oriented; speech intact; face symmetrical; moves all extremities well; CNII-XII intact without focal deficit   Assessment:  1. Bilateral leg pain   2. Numbness and tingling of both lower extremities   3. DM type 2, controlled, with complication ([place])     Plan:  Change Gabapentin to 300 mg at night- she has been taking 300 mg in the am and 600 mg in the pm; may try to taper her off completely; Will start Cymbalta 30 mg- may consider increasing dosage based on response; follow- up in 1 month; Update labs today- may need vascular consult as well;  Refer to podiatrist per patient request;  This visit occurred during the SARS-CoV-2 public health emergency.  Safety protocols were in place, including screening questions prior to the visit, additional usage of staff PPE, and extensive cleaning of exam room while observing appropriate contact time as indicated for disinfecting solutions.      Return in about 1 month (around 01/14/2020).  Orders Placed This Encounter  Procedures  . B Nat Peptide  . B12  . CBC with Differential/Platelet  . Comp Met (CMET)  . Magnesium  . Ambulatory referral to Podiatry    Referral Priority:   Routine    Referral Type:   Consultation    Referral Reason:   Specialty Services Required    Requested Specialty:   Podiatry    Number of Visits Requested:   1    Requested Prescriptions   Signed Prescriptions Disp Refills  . DULoxetine (CYMBALTA) 30 MG capsule 30 capsule 3    Sig: Take 1 capsule (30 mg total) by mouth daily.

## 2019-12-18 ENCOUNTER — Other Ambulatory Visit: Payer: Self-pay | Admitting: Family

## 2019-12-18 MED ORDER — POTASSIUM CHLORIDE ER 10 MEQ PO TBCR: 10.0000 meq | EXTENDED_RELEASE_TABLET | Freq: Every day | ORAL | 0 refills | Status: DC

## 2019-12-19 ENCOUNTER — Other Ambulatory Visit: Payer: Self-pay | Admitting: Family

## 2019-12-26 ENCOUNTER — Ambulatory Visit (INDEPENDENT_AMBULATORY_CARE_PROVIDER_SITE_OTHER): Payer: Medicare Other | Admitting: Ophthalmology

## 2019-12-26 ENCOUNTER — Encounter (INDEPENDENT_AMBULATORY_CARE_PROVIDER_SITE_OTHER): Payer: Self-pay | Admitting: Ophthalmology

## 2019-12-26 ENCOUNTER — Other Ambulatory Visit: Payer: Self-pay

## 2019-12-26 DIAGNOSIS — E113393 Type 2 diabetes mellitus with moderate nonproliferative diabetic retinopathy without macular edema, bilateral: Secondary | ICD-10-CM | POA: Insufficient documentation

## 2019-12-26 DIAGNOSIS — H35413 Lattice degeneration of retina, bilateral: Secondary | ICD-10-CM | POA: Insufficient documentation

## 2019-12-26 NOTE — Assessment & Plan Note (Signed)
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

## 2019-12-26 NOTE — Progress Notes (Signed)
12/26/2019     CHIEF COMPLAINT Patient presents for Retina Follow Up   HISTORY OF PRESENT ILLNESS: Amy Escobar is a 74 y.o. female who presents to the clinic today for:   HPI    Retina Follow Up    Patient presents with  Diabetic Retinopathy.  In both eyes.  Duration of 6 months.  Since onset it is stable.          Comments    6 month follow up - FP OU Patient denies change in vision and overall has no complaints. A1C 6.3 /// LBS 111       Last edited by Gerda Diss on 12/26/2019  8:17 AM. (History)      Referring physician: [name], [place],  [place] [id number]  HISTORICAL INFORMATION:   Selected notes from the MEDICAL RECORD NUMBER    Lab Results  Component Value Date   HGBA1C 5.8 (A) 09/01/2019     CURRENT MEDICATIONS: Current Outpatient Medications (Ophthalmic Drugs)  Medication Sig  . Olopatadine HCl 0.2 % SOLN    No current facility-administered medications for this visit. (Ophthalmic Drugs)   Current Outpatient Medications (Other)  Medication Sig  . amLODipine (NORVASC) 2.5 MG tablet TAKE ONE TABLET BY MOUTH DAILY  . aspirin EC 81 MG tablet Take 81 mg by mouth daily.  . Azelastine-Fluticasone 137-50 MCG/ACT SUSP Place 1 spray into the nose 2 (two) times daily.  . benzonatate (TESSALON) 200 MG capsule Take 1 capsule (200 mg total) by mouth 3 (three) times daily as needed for cough.  . Carbinoxamine Maleate 4 MG TABS Take 1 tablet (4 mg total) by mouth every 8 (eight) hours as needed.  . Chlorphen-Pseudoephed-APAP (CORICIDIN D PO) Take by mouth as needed.   . chlorpheniramine (CHLOR-TRIMETON) 4 MG tablet Take 4 mg by mouth every 4 (four) hours as needed for allergies.  . DULoxetine (CYMBALTA) 30 MG capsule Take 1 capsule (30 mg total) by mouth daily.  Marland Kitchen esomeprazole (NEXIUM) 40 MG capsule TAKE ONE CAPSULE BY MOUTH DAILY 30 TO 60 MINUTES BEFORE YOUR FIRST AND LAST MEALS OF THE DAY  . fesoterodine (TOVIAZ) 4 MG TB24  tablet Take 4 mg daily by mouth.  . gabapentin (NEURONTIN) 600 MG tablet TAKE ONE TABLET BY MOUTH THREE TIMES A DAY (Patient taking differently: Take 600 mg by mouth at bedtime. Take)  . glucose blood (ONETOUCH VERIO) test strip Use to check blood sugar 2 times a day.  . hydrocortisone 2.5 % lotion   . metFORMIN (GLUCOPHAGE-XR) 500 MG 24 hr tablet TAKE TWO TABLETS BY MOUTH TWICE A DAY  . potassium chloride (KLOR-CON) 10 MEQ tablet Take 1 tablet (10 mEq total) by mouth daily.  Marland Kitchen Respiratory Therapy Supplies MISC Use flutter valve as needed to break the coughing cycle.  . rosuvastatin (CRESTOR) 5 MG tablet TAKE ONE TABLET BY MOUTH DAILY  . sulfamethoxazole-trimethoprim (BACTRIM DS) 800-160 MG tablet Take 1 tablet by mouth 2 (two) times daily.  Marland Kitchen triamcinolone cream (KENALOG) 0.1 % APPLY 1 APPLICATION TOPICALLY 4 (FOUR) TIMES DAILY AS NEEDED FOR RASH  . UNKNOWN TO PATIENT Inject as directed every 6 (six) months. 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 valsartan (DIOVAN) 80 MG tablet TAKE ONE TABLET BY MOUTH DAILY  . Vitamin D, Ergocalciferol, (DRISDOL) 1.25 MG (50000 UNIT) CAPS capsule TAKE 1 CAPSULE BY MOUTH ONCE WEEKLY   No current facility-administered medications for this visit. (Other)  REVIEW OF SYSTEMS:    ALLERGIES Allergies  Allergen Reactions  . Olmesartan Medoxomil     REACTION: headache    PAST MEDICAL HISTORY Past Medical History:  Diagnosis Date  . Alkaline phosphatase deficiency    w/u Ne  . Allergic rhinitis   . Anxiety   . Arthritis   . DM2 (diabetes mellitus, type 2) ([place])   . GERD (gastroesophageal reflux disease)   . Gout   . Hemorrhoids   . Hyperlipidemia   . Hypertension   . Mild pulmonary hypertension ([place])   . NSVT (nonsustained ventricular tachycardia) ([place])   . Osteoporosis   . PVC's (premature ventricular contractions)   . Thyroid nodule    small  . Tubular adenoma of colon 2017  . Urticaria   . Uterine fibroid    Past  Surgical History:  Procedure Laterality Date  . CATARACT EXTRACTION Bilateral 2016  . COLONOSCOPY  2007  . echocardiogram (other)  01/16/2002  . 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 stress cardiolite  02/12/2006  . TOENAIL EXCISION    . TUBAL LIGATION    . TYMPANOSTOMY TUBE PLACEMENT      FAMILY HISTORY Family History  Problem Relation Age of Onset  . Heart disease Mother   . Allergic rhinitis Mother   . Heart disease Father   . Heart disease Maternal Grandfather   . Rectal cancer Maternal Grandfather   . Stomach cancer Maternal Grandmother   . Colon cancer Neg Hx   . Breast cancer Neg Hx     SOCIAL HISTORY Social History   Tobacco Use  . Smoking status: Former Smoker    Packs/day: 0.25    Years: 5.00    Pack years: 1.25    Types: Cigarettes    Quit date: 06/29/1968    Years since quitting: 51.5  . Smokeless tobacco: Never Used  Vaping Use  . Vaping Use: Never used  Substance Use Topics  . Alcohol use: No    Alcohol/week: 0.0 standard drinks  . Drug use: No         OPHTHALMIC EXAM:  Base Eye Exam    Visual Acuity (Snellen - Linear)      Right Left   Dist [place] 20/20 20/20-1       Tonometry (Tonopen, 8:21 AM)      Right Left   Pressure 7 7       Pupils      Pupils Dark Light Shape React APD   Right PERRL 4 3 Round Brisk None   Left PERRL 4 3 Round Brisk None       Visual Fields (Counting fingers)      Left Right    Full Full       Extraocular Movement      Right Left    Full Full       Neuro/Psych    Oriented x3: Yes   Mood/Affect: Normal       Dilation    Both eyes: 1.0% Mydriacyl, 2.5% Phenylephrine @ 8:21 AM        Slit Lamp and Fundus Exam    External Exam      Right Left   External Normal Normal       Slit Lamp Exam      Right Left   Lids/Lashes Normal Normal   Conjunctiva/Sclera White and quiet White and quiet   Cornea Clear Clear   Anterior Chamber Deep and quiet Deep and quiet   Iris  Round and reactive Round and  reactive   Lens Posterior chamber intraocular lens Posterior chamber intraocular lens   Anterior Vitreous Normal Normal       Fundus Exam      Right Left   Posterior Vitreous Normal Normal   Disc Normal Normal   C/D Ratio 0.35 0.35   Macula Normal Normal   Vessels NPDR- Moderate NPDR- Moderate   Periphery , Heavy pigmented region supero temporal to the macula OD, unchanged with good peripheral PRP in the temporal anterior periphery in regions of previous "white lines" of nonperfusion. , Heavy pigmented region  temporal to the macula OD, unchanged with good peripheral PRP in the temporal anterior periphery in regions of previous "white lines" of nonperfusion.          IMAGING AND PROCEDURES  Imaging and Procedures for 12/26/19  Color Fundus Photography Optos - OU - Both Eyes       Right Eye Progression has been stable. Disc findings include normal observations. Macula : normal observations.   Left Eye Progression has been stable.   Notes OD, moderate nonproliferative diabetic retinopathy stable.  Good PRP in the anterior temporal periphery and regions of retinal nonperfusion   OS, moderate nonproliferative diabetic retinopathy, stable, good PRP in the anterior temporal periphery periphery and regions of nonperfusion.  Old lattice type degeneration temporal to the macula stable                ASSESSMENT/PLAN:  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 edema associated with type 2 diabetes mellitus (HCC) Overall stable, some regions of anterior retinal nonperfusion noted in the past, these have been successfully treated to prevent neovascular stimulus, with anterior PRP.  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  Lattice degeneration of both retinas No holes or tears.'s condition stable OU.      ICD-10-CM   1. 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 edema associated with type 2  diabetes mellitus (HCC)  A12.8786 Color Fundus Photography Optos - OU - Both Eyes  2. Lattice degeneration of both retinas  H35.413 Color Fundus Photography Optos - OU - Both Eyes    1.  2.  3.  Ophthalmic Meds Ordered this visit:  No orders of the defined types were placed in this encounter.      Return in about 9 months (around 09/24/2020) for DILATE OU, COLOR FP.  There are no Patient Instructions on file for this visit.   Explained the diagnoses, plan, and follow up with the patient and they expressed understanding.  Patient expressed understanding of the importance of proper follow up care.   Clent Demark Joelys Staubs M.D. Diseases & Surgery of the Retina and Vitreous Retina & Diabetic [place] 12/26/19     Abbreviations: M myopia (nearsighted); A astigmatism; H hyperopia (farsighted); P presbyopia; Mrx spectacle prescription;  CTL contact lenses; OD right eye; OS left eye; OU both eyes  XT exotropia; ET esotropia; PEK punctate epithelial keratitis; PEE punctate epithelial erosions; DES dry eye syndrome; MGD meibomian gland dysfunction; ATs artificial tears; PFAT's preservative free artificial tears; [place] nuclear sclerotic cataract; PSC posterior subcapsular cataract; ERM epi-retinal membrane; PVD posterior vitreous detachment; RD retinal detachment; DM diabetes mellitus; DR diabetic retinopathy; NPDR non-proliferative diabetic retinopathy; PDR proliferative diabetic retinopathy; CSME clinically significant macular edema; DME diabetic macular edema; dbh dot blot hemorrhages; CWS cotton wool spot; POAG primary open angle glaucoma; C/D cup-to-disc ratio; HVF humphrey visual field; GVF goldmann visual field; OCT  optical coherence tomography; IOP intraocular pressure; BRVO Branch retinal vein occlusion; CRVO central retinal vein occlusion; CRAO central retinal artery occlusion; BRAO branch retinal artery occlusion; RT retinal tear; SB scleral buckle; PPV pars plana vitrectomy; VH Vitreous  hemorrhage; PRP panretinal laser photocoagulation; IVK intravitreal kenalog; VMT vitreomacular traction; MH Macular hole;  NVD neovascularization of the disc; NVE neovascularization elsewhere; AREDS age related eye disease study; ARMD age related macular degeneration; POAG primary open angle glaucoma; EBMD epithelial/anterior basement membrane dystrophy; ACIOL anterior chamber intraocular lens; IOL intraocular lens; PCIOL posterior chamber intraocular lens; Phaco/IOL phacoemulsification with intraocular lens placement; [place] photorefractive keratectomy; LASIK laser assisted in situ keratomileusis; HTN hypertension; DM diabetes mellitus; COPD chronic obstructive pulmonary disease

## 2019-12-26 NOTE — Assessment & Plan Note (Signed)
No holes or tears.'s condition stable OU.

## 2020-01-08 ENCOUNTER — Other Ambulatory Visit: Payer: Self-pay

## 2020-01-08 ENCOUNTER — Encounter: Payer: Self-pay | Admitting: Family Medicine

## 2020-01-08 ENCOUNTER — Ambulatory Visit (INDEPENDENT_AMBULATORY_CARE_PROVIDER_SITE_OTHER): Payer: Medicare Other | Admitting: Family Medicine

## 2020-01-08 DIAGNOSIS — M170 Bilateral primary osteoarthritis of knee: Secondary | ICD-10-CM | POA: Diagnosis not present

## 2020-01-08 NOTE — Progress Notes (Signed)
[facility] [address] [place] Phone: [phone] Subjective:   I Amy Escobar am serving as a Education administrator for Dr. Hulan Saas.  This visit occurred during the SARS-CoV-2 public health emergency.  Safety protocols were in place, including screening questions prior to the visit, additional usage of staff PPE, and extensive cleaning of exam room while observing appropriate contact time as indicated for disinfecting solutions.   I'm seeing this patient by the request  of:  [name], FNP  CC: Bilateral knee pain  [id number]   3/31/2021Chronic problem : With exacerbation  interventions previously, including medication management: Multiple injections including steroid, viscosupplementation, topical anti-inflammatories and oral anti-inflammatories   Interventions this visit: Injections given today.  Discussed medication management including gabapentin and to continue, topical anti-inflammatories We discussed with patient the importance ergonomics, home exercises, icing regimen, and over-the-counter natural products.   Future considerations but will be based on evaluation and next visit: Consider viscosupplementation again    Return to clinic: 4 to 6 weeks  01/08/2020 Amy Escobar is a 74 y.o. female coming in with complaint of bilateral knee pain. Would like bilateral injection. Dr. Georgina Snell gave gel injection. Patient states the injection didn't help.       Past Medical History:  Diagnosis Date  . Alkaline phosphatase deficiency    w/u Ne  . Allergic rhinitis   . Anxiety   . Arthritis   . DM2 (diabetes mellitus, type 2) ([place])   . GERD (gastroesophageal reflux disease)   . Gout   . Hemorrhoids   . Hyperlipidemia   . Hypertension   . Mild pulmonary hypertension ([place])   . NSVT (nonsustained ventricular tachycardia) ([place])   . Osteoporosis   . PVC's (premature ventricular contractions)   . Thyroid  nodule    small  . Tubular adenoma of colon 2017  . Urticaria   . Uterine fibroid    Past Surgical History:  Procedure Laterality Date  . CATARACT EXTRACTION Bilateral 2016  . COLONOSCOPY  2007  . echocardiogram (other)  01/16/2002  . 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 stress cardiolite  02/12/2006  . TOENAIL EXCISION    . TUBAL LIGATION    . TYMPANOSTOMY TUBE PLACEMENT     Social History   Socioeconomic History  . Marital status: Widowed    Spouse name: Not on file  . Number of children: 2  . Years of education: Not on file  . Highest education level: Not on file  Occupational History  . Occupation: Surveyor, minerals: UNEMPLOYED  Tobacco Use  . Smoking status: Former Smoker    Packs/day: 0.25    Years: 5.00    Pack years: 1.25    Types: Cigarettes    Quit date: 06/29/1968    Years since quitting: 51.5  . Smokeless tobacco: Never Used  Vaping Use  . Vaping Use: Never used  Substance and Sexual Activity  . Alcohol use: No    Alcohol/week: 0.0 standard drinks  . Drug use: No  . Sexual activity: Not on file  Other Topics Concern  . Not on file  Social History Narrative   Widowed 2010.    Social Determinants of Health   Financial Resource Strain:   . Difficulty of Paying Living Expenses:   Food Insecurity:   . Worried About Charity fundraiser in the Last Year:   . [place] in the Last Year:  Transportation Needs:   . Film/video editor (Medical):   Marland Kitchen Lack of Transportation (Non-Medical):   Physical Activity:   . Days of Exercise per Week:   . Minutes of Exercise per Session:   Stress:   . Feeling of Stress :   Social Connections:   . Frequency of Communication with Friends and Family:   . Frequency of Social Gatherings with Friends and Family:   . Attends Religious Services:   . Active Member of Clubs or Organizations:   . Attends Archivist Meetings:   Marland Kitchen Marital Status:    Allergies  Allergen Reactions  .  Olmesartan Medoxomil     REACTION: headache   Family History  Problem Relation Age of Onset  . Heart disease Mother   . Allergic rhinitis Mother   . Heart disease Father   . Heart disease Maternal Grandfather   . Rectal cancer Maternal Grandfather   . Stomach cancer Maternal Grandmother   . Colon cancer Neg Hx   . Breast cancer Neg Hx     Current Outpatient Medications (Endocrine & Metabolic):  .  metFORMIN (GLUCOPHAGE-XR) 500 MG 24 hr tablet, TAKE TWO TABLETS BY MOUTH TWICE A DAY  Current Outpatient Medications (Cardiovascular):  .  amLODipine (NORVASC) 2.5 MG tablet, TAKE ONE TABLET BY MOUTH DAILY .  rosuvastatin (CRESTOR) 5 MG tablet, TAKE ONE TABLET BY MOUTH DAILY .  valsartan (DIOVAN) 80 MG tablet, TAKE ONE TABLET BY MOUTH DAILY  Current Outpatient Medications (Respiratory):  Marland Kitchen  Azelastine-Fluticasone 137-50 MCG/ACT SUSP, Place 1 spray into the nose 2 (two) times daily. .  benzonatate (TESSALON) 200 MG capsule, Take 1 capsule (200 mg total) by mouth 3 (three) times daily as needed for cough. .  Carbinoxamine Maleate 4 MG TABS, Take 1 tablet (4 mg total) by mouth every 8 (eight) hours as needed. .  Chlorphen-Pseudoephed-APAP (CORICIDIN D PO), Take by mouth as needed.  .  chlorpheniramine (CHLOR-TRIMETON) 4 MG tablet, Take 4 mg by mouth every 4 (four) hours as needed for allergies.  Current Outpatient Medications (Analgesics):  .  aspirin EC 81 MG tablet, Take 81 mg by mouth daily.   Current Outpatient Medications (Other):  Marland Kitchen  DULoxetine (CYMBALTA) 30 MG capsule, Take 1 capsule (30 mg total) by mouth daily. Marland Kitchen  esomeprazole (NEXIUM) 40 MG capsule, TAKE ONE CAPSULE BY MOUTH DAILY 30 TO 60 MINUTES BEFORE YOUR FIRST AND LAST MEALS OF THE DAY .  fesoterodine (TOVIAZ) 4 MG TB24 tablet, Take 4 mg daily by mouth. .  gabapentin (NEURONTIN) 600 MG tablet, TAKE ONE TABLET BY MOUTH THREE TIMES A DAY (Patient taking differently: Take 600 mg by mouth at bedtime. Take) .  glucose blood  (ONETOUCH VERIO) test strip, Use to check blood sugar 2 times a day. .  hydrocortisone 2.5 % lotion,  .  Olopatadine HCl 0.2 % SOLN,  .  potassium chloride (KLOR-CON) 10 MEQ tablet, Take 1 tablet (10 mEq total) by mouth daily. Marland Kitchen  Respiratory Therapy Supplies MISC, Use flutter valve as needed to break the coughing cycle. .  sulfamethoxazole-trimethoprim (BACTRIM DS) 800-160 MG tablet, Take 1 tablet by mouth 2 (two) times daily. Marland Kitchen  triamcinolone cream (KENALOG) 0.1 %, APPLY 1 APPLICATION TOPICALLY 4 (FOUR) TIMES DAILY AS NEEDED FOR RASH .  UNKNOWN TO PATIENT, Inject as directed every 6 (six) months. 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  Vitamin D, Ergocalciferol, (DRISDOL) 1.25 MG (50000 UNIT) CAPS capsule, TAKE 1 CAPSULE BY MOUTH ONCE WEEKLY  Reviewed prior external information including notes and imaging from  primary care provider As well as notes that were available from care everywhere and other healthcare systems.  Past medical history, social, surgical and family history all reviewed in electronic medical record.  No pertanent information unless stated regarding to the chief complaint.   Review of Systems:  No headache, visual changes, nausea, vomiting, diarrhea, constipation, dizziness, abdominal pain, skin rash, fevers, chills, night sweats, weight loss, swollen lymph nodes, body aches, joint swelling, chest pain, shortness of breath, mood changes. POSITIVE muscle aches  Objective  Blood pressure 120/68, pulse 87, height 5' 4.5" (1.638 m), weight 161 lb (73 kg), SpO2 97 %.   General: No apparent distress alert and oriented x3 mood and affect normal, dressed appropriately.  HEENT: Pupils equal, extraocular movements intact  Respiratory: Patient's speak in full sentences and does not appear short of breath  Cardiovascular: No lower extremity edema, non tender, no erythema  Neuro: Cranial nerves II through XII are intact, neurovascularly intact in all extremities with 2+  DTRs and 2+ pulses.  Gait antalgic   MSK: Knee: Bilateral valgus deformity noted. Large thigh to calf ratio.  Tender to palpation over medial and PF joint line.  ROM full in flexion and extension and lower leg rotation. instability with valgus force.  painful patellar compression. Patellar glide with moderate crepitus. Patellar and quadriceps tendons unremarkable. Hamstring and quadriceps strength is normal.  After informed written and verbal consent, patient was seated on exam table. Right knee was prepped with alcohol swab and utilizing anterolateral approach, patient's right knee space was injected with 4:1  marcaine 0.5%: Kenalog 40mg /dL. Patient tolerated the procedure well without immediate complications.  After informed written and verbal consent, patient was seated on exam table. Left knee was prepped with alcohol swab and utilizing anterolateral approach, patient's left knee space was injected with 4:1  marcaine 0.5%: Kenalog 40mg /dL. Patient tolerated the procedure well without immediate complications.    Impression and Recommendations:     The above documentation has been reviewed and is accurate and complete Lyndal Pulley, DO       Note: This dictation was prepared with Dragon dictation along with smaller phrase technology. Any transcriptional errors that result from this process are unintentional.

## 2020-01-08 NOTE — Patient Instructions (Addendum)
Good to see you!  See me again in 8 weeks.  

## 2020-01-08 NOTE — Assessment & Plan Note (Signed)
Chronic problem with worsening symptoms at this time.  Patient is on Cymbalta and we discussed this at great length, help with some of the chronic pain.  Discussed different types of injections that I think also will be beneficial for this individual.  Discussed icing regimen and home exercises.  Follow-up again in  8 weeks.

## 2020-01-10 ENCOUNTER — Ambulatory Visit: Payer: Medicare Other | Admitting: Podiatry

## 2020-01-10 ENCOUNTER — Encounter: Payer: Self-pay | Admitting: Podiatry

## 2020-01-10 ENCOUNTER — Other Ambulatory Visit: Payer: Self-pay

## 2020-01-10 VITALS — BP 126/64 | HR 85

## 2020-01-10 DIAGNOSIS — E1140 Type 2 diabetes mellitus with diabetic neuropathy, unspecified: Secondary | ICD-10-CM | POA: Insufficient documentation

## 2020-01-10 DIAGNOSIS — M2010 Hallux valgus (acquired), unspecified foot: Secondary | ICD-10-CM

## 2020-01-10 DIAGNOSIS — E118 Type 2 diabetes mellitus with unspecified complications: Secondary | ICD-10-CM

## 2020-01-10 DIAGNOSIS — M129 Arthropathy, unspecified: Secondary | ICD-10-CM | POA: Insufficient documentation

## 2020-01-10 DIAGNOSIS — E1142 Type 2 diabetes mellitus with diabetic polyneuropathy: Secondary | ICD-10-CM

## 2020-01-10 NOTE — Progress Notes (Signed)
This patient returns to my office for at risk foot care.  This patient requires this care by a professional since this patient will be at risk due to having type 2 diabetes.   This patient has been under treatment for neuropathy with gabapentin and cymbalta. She was referred to the office by her medical doctor , Dr.  Valere Dross.  She requests diabetic shoes .  Patient presents to the office for diabetic foot exam.  Patient has history of bilateral foot surgery.    This patient presents for at risk foot care today.  General Appearance  Alert, conversant and in no acute stress.  Vascular  Dorsalis pedis and posterior tibial  pulses are palpable  bilaterally.  Capillary return is within normal limits  bilaterally. Temperature is within normal limits  bilaterally.  Neurologic  Senn-Weinstein monofilament wire test within normal limits  bilaterally. Muscle power within normal limits bilaterally.  Nails Normal nails with no drainage or infection.  Orthopedic  No limitations of motion  feet .  No crepitus or effusions noted.  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  Skin  normotropic skin with no porokeratosis noted bilaterally.  No signs of infections or ulcers noted.     Diabetes with neuropathy.  HAV  B/L.    Consent was obtained for treatment procedures.  Diabetic foot exam reveals no vascular or LOPS pathology.   Patient has an appointment with pedorthist for diabetic shoes.   Return office visit   prn                  Told patient to return for periodic foot care and evaluation due to potential at risk complications.   Gardiner Barefoot DPM

## 2020-01-15 ENCOUNTER — Ambulatory Visit: Payer: Medicare Other | Admitting: Family

## 2020-01-17 ENCOUNTER — Other Ambulatory Visit: Payer: Self-pay | Admitting: Internal Medicine

## 2020-01-17 DIAGNOSIS — R058 Other specified cough: Secondary | ICD-10-CM

## 2020-01-19 ENCOUNTER — Ambulatory Visit (INDEPENDENT_AMBULATORY_CARE_PROVIDER_SITE_OTHER): Payer: Medicare Other | Admitting: Family

## 2020-01-19 ENCOUNTER — Other Ambulatory Visit: Payer: Self-pay

## 2020-01-19 ENCOUNTER — Encounter: Payer: Self-pay | Admitting: Family

## 2020-01-19 VITALS — BP 148/86 | HR 90 | Temp 98.7°F | Ht 64.5 in | Wt 155.0 lb

## 2020-01-19 DIAGNOSIS — E1142 Type 2 diabetes mellitus with diabetic polyneuropathy: Secondary | ICD-10-CM

## 2020-01-19 NOTE — Patient Instructions (Signed)
Please hold the Gabapentin for now; Let's try the Duloxetine 30 mg twice for now; Call within 1 week to let me know how you are doing;

## 2020-01-19 NOTE — Progress Notes (Signed)
Amy Escobar is a 74 y.o. 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/19/2019   Claudication ([place]) 04/19/2019   Morbid obesity ([place]) 04/19/2019   Suspected COVID-19 virus infection 04/13/2019   Upper airway cough syndrome 08/22/2018   Greater trochanteric bursitis of both hips 06/15/2018   Trigger point of shoulder region, left 02/07/2018   Hypertension    Mild pulmonary hypertension (HCC)    NSVT (nonsustained ventricular tachycardia) (HCC)    PVC's (premature ventricular contractions)    URI (upper respiratory infection) 08/09/2016   Neck pain 02/10/2016   Urinary incontinence 02/10/2016   Degenerative arthritis of knee, bilateral 07/17/2015   Knee MCL sprain 06/07/2015   Discomfort in chest 02/15/2015   Chest pain 02/15/2015   DM type 2, controlled, with complication ([place])    Wellness examination 08/06/2014   Acute meniscal tear of knee 02/13/2014   Primary localized osteoarthrosis, lower leg 02/13/2014   Gastrocnemius tear 12/25/2013   UTI (urinary tract infection) 12/20/2013   Right knee pain 12/20/2013   Pulmonary hypertension ([place]) 10/06/2013   Other forms of dyspnea 08/04/2013   Dysphagia, unspecified(787.20) 08/10/2012   Hip pain, left 02/02/2012   Painful respiration 05/25/2011   Encounter for long-term (current) use of other medications 01/30/2011   PELVIC PAIN, LEFT 07/30/2010   TOBACCO USE, QUIT 07/07/2010   FATIGUE  12/20/2009   Headache 12/20/2009   Low back pain 12/26/2008   Disorder of liver 04/13/2008   FOOT PAIN, BILATERAL 04/13/2008   FIBROIDS, UTERUS 11/24/2007   THYROID NODULE, LEFT 11/24/2007   HYPERCHOLESTEROLEMIA 11/24/2007   CARPAL TUNNEL SYNDROME, BILATERAL 11/24/2007   ALKALINE PHOSPHATASE, ELEVATED 11/24/2007   Gout 08/10/2007   ANXIETY 08/10/2007   Essential hypertension 08/10/2007   Cough, persistent 08/01/2007   Perennial and seasonal allergic rhinitis 01/14/2007   GERD 01/14/2007   Osteoporosis 01/14/2007    Current Outpatient Medications  Medication Sig Dispense Refill   amLODipine (NORVASC) 2.5 MG tablet TAKE ONE TABLET BY MOUTH DAILY 90 tablet 0   aspirin EC 81 MG tablet Take 81 mg by mouth daily.     Azelastine-Fluticasone 137-50 MCG/ACT SUSP Place 1 spray into the nose 2 (two) times daily. 23 g 5   benzonatate (TESSALON) 200 MG capsule Take 1 capsule (200 mg total) by mouth 3 (three) times daily as needed for cough. 45 capsule 2   Carbinoxamine Maleate 4 MG TABS Take 1 tablet (4 mg total) by mouth every 8 (eight) hours as needed. 56 tablet 5   Chlorphen-Pseudoephed-APAP (CORICIDIN D PO) Take by mouth as needed.      DULoxetine (CYMBALTA) 30 MG capsule Take 1 capsule (30 mg total) by mouth daily. 30 capsule 3   esomeprazole (NEXIUM) 40 MG capsule TAKE ONE CAPSULE BY MOUTH DAILY 30 TO 60 MINUTES BEFORE YOUR FIRST AND LAST MEALS OF THE DAY 180 capsule 3   fesoterodine (TOVIAZ) 4 MG TB24 tablet Take 4 mg daily by mouth.     gabapentin (NEURONTIN) 600 MG tablet TAKE ONE TABLET BY MOUTH THREE TIMES A DAY (Patient taking differently: Take 600  mg by mouth at bedtime. Take) 270 tablet 2   glucose blood (ONETOUCH VERIO) test strip Use to check blood sugar 2 times a day. 200 each 2   hydrocortisone 2.5 % lotion      metFORMIN (GLUCOPHAGE-XR) 500 MG 24 hr tablet TAKE TWO TABLETS BY MOUTH TWICE A DAY 360 tablet 0   Olopatadine HCl 0.2 % SOLN       rosuvastatin (CRESTOR) 5 MG tablet TAKE ONE TABLET BY MOUTH DAILY 90 tablet 3   sulfamethoxazole-trimethoprim (BACTRIM DS) 800-160 MG tablet Take 1 tablet by mouth 2 (two) times daily. 10 tablet 0   triamcinolone cream (KENALOG) 0.1 % APPLY 1 APPLICATION TOPICALLY 4 (FOUR) TIMES DAILY AS NEEDED FOR RASH 45 g 1   UNKNOWN TO PATIENT Inject as directed every 6 (six) months. Injection for arthritis in right and left knees     valsartan (DIOVAN) 80 MG tablet TAKE ONE TABLET BY MOUTH DAILY 90 tablet 3   Vitamin D, Ergocalciferol, (DRISDOL) 1.25 MG (50000 UNIT) CAPS capsule TAKE 1 CAPSULE BY MOUTH ONCE WEEKLY 12 capsule 0   chlorpheniramine (CHLOR-TRIMETON) 4 MG tablet Take 4 mg by mouth every 4 (four) hours as needed for allergies. (Patient not taking: Reported on 01/19/2020)     potassium chloride (KLOR-CON) 10 MEQ tablet Take 1 tablet (10 mEq total) by mouth daily. (Patient not taking: Reported on 01/19/2020) 5 tablet 0   Respiratory Therapy Supplies MISC Use flutter valve as needed to break the coughing cycle. (Patient not taking: Reported on 01/19/2020) 1 each 1   No current facility-administered medications for this visit.    Allergies: Olmesartan medoxomil  Past Medical History:  Diagnosis Date   Alkaline phosphatase deficiency    w/u Ne   Allergic rhinitis    Anxiety    Arthritis    DM2 (diabetes mellitus, type 2) (HCC)    GERD (gastroesophageal reflux disease)    Gout    Hemorrhoids    Hyperlipidemia    Hypertension    Mild pulmonary hypertension (HCC)    NSVT (nonsustained ventricular tachycardia) (HCC)    Osteoporosis    PVC's (premature ventricular contractions)    Thyroid nodule    small   Tubular adenoma of colon 2017   Urticaria    Uterine fibroid     Past Surgical History:  Procedure Laterality Date   CATARACT EXTRACTION Bilateral 2016   COLONOSCOPY  2007   echocardiogram (other)  01/16/2002   removed tumors from foot nerves  04/1999    stress cardiolite  02/12/2006   TOENAIL EXCISION     TUBAL LIGATION     TYMPANOSTOMY TUBE PLACEMENT      Family History  Problem Relation Age of Onset   Heart disease Mother    Allergic rhinitis Mother    Heart disease Father    Heart disease Maternal Grandfather    Rectal cancer Maternal Grandfather    Stomach cancer Maternal Grandmother    Colon cancer Neg Hx    Breast cancer Neg Hx     Social History   Tobacco Use   Smoking status: Former Smoker    Packs/day: 0.25    Years: 5.00    Pack years: 1.25    Types: Cigarettes    Quit date: 06/29/1968    Years since quitting: 51.5   Smokeless tobacco: Never Used  Substance Use Topics   Alcohol use: No    Alcohol/week: 0.0 standard drinks    Subjective:  1 month follow up on  trial of Duloxetine for neuropathy; is very pleased with response; feels like she has had 70% improvement in symptoms; feels better with decreased Gabapentin dosage and would be comfortable trying to stop completely;   Objective:  Vitals:   01/19/20 1429  BP: (!) 148/86  Pulse: 90  Temp: 98.7 F (37.1 C)  TempSrc: Oral  SpO2: 98%  Weight: 155 lb (70.3 kg)  Height: 5' 4.5" (1.638 m)    General: Well developed, well nourished, in no acute distress  Skin : Warm and dry.  Head: Normocephalic and atraumatic  Lungs: Respirations unlabored; clear to auscultation bilaterally without wheeze, rales, rhonchi  Musculoskeletal: No deformities; no active joint inflammation  Extremities: No edema, cyanosis, clubbing  Vessels: Symmetric bilaterally  Neurologic: Alert and oriented; speech intact; face symmetrical; moves all extremities well; CNII-XII intact without focal deficit   Assessment:  1. Diabetic polyneuropathy associated with type 2 diabetes mellitus (HCC)     Plan:  Good response to Duloxetine; she will trying increase to 30 mg bid; hold Gabapentin for now; she will call back with response in 1 week;  This visit occurred during the  SARS-CoV-2 public health emergency.  Safety protocols were in place, including screening questions prior to the visit, additional usage of staff PPE, and extensive cleaning of exam room while observing appropriate contact time as indicated for disinfecting solutions.      No follow-ups on file.  No orders of the defined types were placed in this encounter.   Requested Prescriptions    No prescriptions requested or ordered in this encounter

## 2020-01-26 ENCOUNTER — Other Ambulatory Visit: Payer: Medicare Other | Admitting: Orthotics

## 2020-01-26 ENCOUNTER — Other Ambulatory Visit: Payer: Self-pay

## 2020-01-28 ENCOUNTER — Other Ambulatory Visit: Payer: Self-pay | Admitting: Cardiology

## 2020-01-28 DIAGNOSIS — E782 Mixed hyperlipidemia: Secondary | ICD-10-CM

## 2020-01-28 DIAGNOSIS — I10 Essential (primary) hypertension: Secondary | ICD-10-CM

## 2020-01-28 DIAGNOSIS — R0609 Other forms of dyspnea: Secondary | ICD-10-CM

## 2020-01-31 ENCOUNTER — Telehealth: Payer: Self-pay | Admitting: Family

## 2020-01-31 NOTE — Telephone Encounter (Signed)
Patient is requesting refill on RX stating to take medication twice a day.  DULoxetine (CYMBALTA) 30 MG capsule  At her LOV 01/19/20 Valere Dross told her to call back in a week and let her know how she was doing. She would like to stay on RX twice a day.

## 2020-02-01 MED ORDER — DULOXETINE HCL 30 MG PO CPEP: 30.0000 mg | ORAL_CAPSULE | Freq: Two times a day (BID) | ORAL | 3 refills | Status: DC

## 2020-02-01 NOTE — Telephone Encounter (Signed)
Reviewed chart per last Amy Escobar agreed to increase Cymbalta to twice a day. Sent new rx to [facility] for twice a day.Marland KitchenJohny Chess

## 2020-02-02 ENCOUNTER — Other Ambulatory Visit: Payer: Self-pay

## 2020-02-02 ENCOUNTER — Ambulatory Visit: Payer: Self-pay

## 2020-02-02 ENCOUNTER — Telehealth: Payer: Self-pay

## 2020-02-02 ENCOUNTER — Encounter: Payer: Self-pay | Admitting: Family Medicine

## 2020-02-02 ENCOUNTER — Ambulatory Visit (INDEPENDENT_AMBULATORY_CARE_PROVIDER_SITE_OTHER): Payer: Medicare Other | Admitting: Family Medicine

## 2020-02-02 VITALS — BP 120/76 | HR 89 | Ht 64.5 in | Wt 152.2 lb

## 2020-02-02 DIAGNOSIS — M25562 Pain in left knee: Secondary | ICD-10-CM | POA: Diagnosis not present

## 2020-02-02 DIAGNOSIS — M25561 Pain in right knee: Secondary | ICD-10-CM

## 2020-02-02 NOTE — Patient Instructions (Signed)
Thank you for coming in today.  Plan for home exercise.   The [place] L Protocol for Hamstring Strains  Lyman Bishop PT  Recheck as needed.

## 2020-02-02 NOTE — Telephone Encounter (Signed)
Scheduled appointment with Dr. Georgina Snell

## 2020-02-02 NOTE — Progress Notes (Signed)
I, Amy Escobar, LAT, ATC, am serving as scribe for Dr. Lynne Escobar.  Amy Escobar is a 74 y.o. female who presents to [place] at [facility] today for new posterior left knee pain.  She was last seen by Dr. Tamala Escobar on 01/08/20 and had B knee injections.  She was previously seen by Dr. Georgina Escobar on 11/02/19 and had B Durolane injections.  Since her last visit w/ Dr. Tamala Escobar, pt reports L post knee pain that runs up the post thigh x one week.  Pain is mostly posterior lateral.  She denies any swelling the back of the knee.  She notes an antalgic gait.  She cannot recall any injury.   Pertinent review of systems: No fevers or chills  Relevant historical information: Known osteoarthritis.   Exam:  BP 120/76 (BP Location: Right Arm, Patient Position: Sitting, Cuff Size: Normal)   Pulse 89   Ht 5' 4.5" (1.638 m)   Wt 152 lb 3.2 oz (69 kg)   SpO2 98%   BMI 25.72 kg/m  General: Well Developed, well nourished, and in no acute distress.   MSK: Left knee normal-appearing without significant effusion. Range of motion 5-120 degrees. Not particular tender to palpation. Stable ligamentous exam. Intact strength to extension.  4/5 strength to flexion with pain. Antalgic gait mildly.    Lab and Radiology Results Diagnostic Limited MSK Ultrasound of: Left knee Tendon intact normal-appearing No significant joint effusion superior patellar space. Patellar tendon normal. Medial joint line narrowed degenerative appearing Lateral joint line mildly narrowed mild degenerative appearing Posterior knee no Baker's cyst.  Normal-appearing medial and lateral hamstring structures. Impression: DJD  Procedure: Real-time Ultrasound Guided Injection of left knee superior patellar space laterally. Device: Philips Affiniti 50G Images permanently stored and available for review in the ultrasound unit. Verbal informed consent obtained.  Discussed risks and benefits of procedure. Warned about  infection bleeding damage to structures skin hypopigmentation and fat atrophy among others. Patient expresses understanding and agreement Time-out conducted.   Noted no overlying erythema, induration, or other signs of local infection.   Skin prepped in a sterile fashion.   Local anesthesia: Topical Ethyl chloride.   With sterile technique and under real time ultrasound guidance:  40 mg of Kenalog and 2 mL of Marcaine injected easily.   Completed without difficulty   Pain partially resolved suggesting accurate placement of the medication.   Advised to call if fevers/chills, erythema, induration, drainage, or persistent bleeding.   Images permanently stored and available for review in the ultrasound unit.  Impression: Technically successful ultrasound guided injection.     Assessment and Plan: 74 y.o. female with left knee pain.  Predominantly due to posterior medial and lateral hamstring structures.  Patient desires steroid injection which I think is reasonable especially given her degenerative changes.  We will also review eccentric hamstring stretches and strengthening program.  Recheck back if not improving.      Orders Placed This Encounter  Procedures  . [place] LIMITED JOINT SPACE STRUCTURES LOW BILAT(NO LINKED CHARGES)    Order Specific Question:   Reason for Exam (SYMPTOM  OR DIAGNOSIS REQUIRED)    Answer:   B knee pain    Order Specific Question:   Preferred imaging location?    Answer:   [place]   No orders of the defined types were placed in this encounter.    Discussed warning signs or symptoms. Please see discharge instructions. Patient expresses understanding.   The above  documentation has been reviewed and is accurate and complete Amy Escobar, M.D.

## 2020-02-05 ENCOUNTER — Ambulatory Visit (INDEPENDENT_AMBULATORY_CARE_PROVIDER_SITE_OTHER): Payer: Medicare Other

## 2020-02-05 ENCOUNTER — Ambulatory Visit (INDEPENDENT_AMBULATORY_CARE_PROVIDER_SITE_OTHER): Payer: Medicare Other | Admitting: Family Medicine

## 2020-02-05 ENCOUNTER — Other Ambulatory Visit: Payer: Self-pay

## 2020-02-05 ENCOUNTER — Encounter: Payer: Self-pay | Admitting: Family Medicine

## 2020-02-05 ENCOUNTER — Ambulatory Visit: Payer: Medicare Other | Admitting: Family Medicine

## 2020-02-05 VITALS — BP 110/82 | HR 106 | Ht 64.0 in

## 2020-02-05 DIAGNOSIS — M545 Low back pain, unspecified: Secondary | ICD-10-CM

## 2020-02-05 DIAGNOSIS — G8929 Other chronic pain: Secondary | ICD-10-CM

## 2020-02-05 DIAGNOSIS — M47816 Spondylosis without myelopathy or radiculopathy, lumbar region: Secondary | ICD-10-CM | POA: Diagnosis not present

## 2020-02-05 MED ORDER — METHYLPREDNISOLONE ACETATE 80 MG/ML IJ SUSP
80.0000 mg | Freq: Once | INTRAMUSCULAR | Status: AC
  Administered 2020-02-05: 80 mg via INTRAMUSCULAR

## 2020-02-05 MED ORDER — PREDNISONE 20 MG PO TABS
40.0000 mg | ORAL_TABLET | Freq: Every day | ORAL | 0 refills | Status: DC
Start: 2020-02-05 — End: 2020-02-13

## 2020-02-05 MED ORDER — KETOROLAC TROMETHAMINE 60 MG/2ML IM SOLN
60.0000 mg | Freq: Once | INTRAMUSCULAR | Status: AC
Start: 2020-02-05 — End: 2020-02-05
  Administered 2020-02-05: 60 mg via INTRAMUSCULAR

## 2020-02-05 NOTE — Assessment & Plan Note (Addendum)
I believe the patient is having more of a lumbar radiculopathy causing some more spasms in the patient's hamstring from time to time.  Patient has had some difficulty with this.  Did have the injections of the knees but knee seem to be better.  Actually has increasing range of motion.  Concerned that this is more secondary to the lumbar radiculopathy and patient is on Cymbalta.  Does not want to start gabapentin but given prednisone.  Patient's last hemoglobin A1c was 5.8 so I do think we have room for the steroid at the moment.  Patient given a shot of Toradol and Depo-Medrol today to help with pain and x-rays are pending.  Patient knows if worsening pain to seek medical attention immediately and potentially go to the emergency room.  Discussed possible muscle relaxers but secondary to patient's other comorbidities she would like to avoid especially with her also taking care of other people in her community.  We did discuss potential laboratory work-up which patient declined after discussing that there is low likelihood, patient is following up in 1 week

## 2020-02-05 NOTE — Patient Instructions (Addendum)
40 mg prednisone daily for 5 days Xray on way out See me back on August 17th at 10:00am

## 2020-02-05 NOTE — Progress Notes (Signed)
Amy Escobar Amy Escobar Phone: [phone] Subjective:   Amy Escobar, am serving as a scribe for Dr. Hulan Saas. This visit occurred during the SARS-CoV-2 public health emergency.  Safety protocols were in place, including screening questions prior to the visit, additional usage of staff PPE, and extensive cleaning of exam room while observing appropriate contact time as indicated for disinfecting solutions.  I'm seeing this patient by the request  of:  [name], FNP  CC: Back and leg pain  XIP:[id number]  Amy Escobar is a 75 y.o. female coming in with complaint of knee pain. Injection 02/02/2020 by Dr Georgina Snell. Pain in posterior calf into the distal hamstring. Using heating pad. Unable to walk with use to crutches or walker.  Patient is having worsening pain but denies any fevers chills or any abnormal weight loss.  States that the knees actually feel good if only the back of the legs that is giving her trouble at this time.  Patient states left greater than right   Past Medical History:  Diagnosis Date  . Alkaline phosphatase deficiency    w/u Ne  . Allergic rhinitis   . Anxiety   . Arthritis   . DM2 (diabetes mellitus, type 2) ([place])   . GERD (gastroesophageal reflux disease)   . Gout   . Hemorrhoids   . Hyperlipidemia   . Hypertension   . Mild pulmonary hypertension ([place])   . NSVT (nonsustained ventricular tachycardia) ([place])   . Osteoporosis   . PVC's (premature ventricular contractions)   . Thyroid nodule    small  . Tubular adenoma of colon 2017  . Urticaria   . Uterine fibroid    Past Surgical History:  Procedure Laterality Date  . CATARACT EXTRACTION Bilateral 2016  . COLONOSCOPY  2007  . echocardiogram (other)  01/16/2002  . 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 stress cardiolite  02/12/2006  . TOENAIL EXCISION    . TUBAL LIGATION    . TYMPANOSTOMY TUBE PLACEMENT     Social  History   Socioeconomic History  . Marital status: Widowed    Spouse name: Not on file  . Number of children: 2  . Years of education: Not on file  . Highest education level: Not on file  Occupational History  . Occupation: Surveyor, minerals: UNEMPLOYED  Tobacco Use  . Smoking status: Former Smoker    Packs/day: 0.25    Years: 5.00    Pack years: 1.25    Types: Cigarettes    Quit date: 06/29/1968    Years since quitting: 51.6  . Smokeless tobacco: Never Used  Vaping Use  . Vaping Use: Never used  Substance and Sexual Activity  . Alcohol use: Escobar    Alcohol/week: 0.0 standard drinks  . Drug use: Escobar  . Sexual activity: Not on file  Other Topics Concern  . Not on file  Social History Narrative   Widowed 2010.    Social Determinants of Health   Financial Resource Strain:   . Difficulty of Paying Living Expenses:   Food Insecurity:   . Worried About Charity fundraiser in the Last Year:   . Arboriculturist in the Last Year:   Transportation Needs:   . Film/video editor (Medical):   Marland Kitchen Lack of Transportation (Non-Medical):   Physical Activity:   . Days of Exercise per Week:   . Minutes of  Exercise per Session:   Stress:   . Feeling of Stress :   Social Connections:   . Frequency of Communication with Friends and Family:   . Frequency of Social Gatherings with Friends and Family:   . Attends Religious Services:   . Active Member of Clubs or Organizations:   . Attends Archivist Meetings:   Marland Kitchen Marital Status:    Allergies  Allergen Reactions  . Olmesartan Medoxomil     REACTION: headache   Family History  Problem Relation Age of Onset  . Heart disease Mother   . Allergic rhinitis Mother   . Heart disease Father   . Heart disease Maternal Grandfather   . Rectal cancer Maternal Grandfather   . Stomach cancer Maternal Grandmother   . Colon cancer Neg Hx   . Breast cancer Neg Hx     Current Outpatient Medications (Endocrine & Metabolic):  .   metFORMIN (GLUCOPHAGE-XR) 500 MG 24 hr tablet, TAKE TWO TABLETS BY MOUTH TWICE A DAY .  predniSONE (DELTASONE) 20 MG tablet, Take 2 tablets (40 mg total) by mouth daily with breakfast.  Current Outpatient Medications (Cardiovascular):  .  amLODipine (NORVASC) 2.5 MG tablet, TAKE ONE TABLET BY MOUTH DAILY .  rosuvastatin (CRESTOR) 5 MG tablet, TAKE ONE TABLET BY MOUTH DAILY .  valsartan (DIOVAN) 80 MG tablet, TAKE ONE TABLET BY MOUTH DAILY  Current Outpatient Medications (Respiratory):  Marland Kitchen  Azelastine-Fluticasone 137-50 MCG/ACT SUSP, Place 1 spray into the nose 2 (two) times daily. .  benzonatate (TESSALON) 200 MG capsule, Take 1 capsule (200 mg total) by mouth 3 (three) times daily as needed for cough. .  Carbinoxamine Maleate 4 MG TABS, Take 1 tablet (4 mg total) by mouth every 8 (eight) hours as needed. .  Chlorphen-Pseudoephed-APAP (CORICIDIN D PO), Take by mouth as needed.  .  chlorpheniramine (CHLOR-TRIMETON) 4 MG tablet, Take 4 mg by mouth every 4 (four) hours as needed for allergies. (Patient not taking: Reported on 01/19/2020)  Current Outpatient Medications (Analgesics):  .  aspirin EC 81 MG tablet, Take 81 mg by mouth daily.   Current Outpatient Medications (Other):  Marland Kitchen  DULoxetine (CYMBALTA) 30 MG capsule, Take 1 capsule (30 mg total) by mouth 2 (two) times daily. Marland Kitchen  esomeprazole (NEXIUM) 40 MG capsule, TAKE ONE CAPSULE BY MOUTH DAILY 30 TO 60 MINUTES BEFORE YOUR FIRST AND LAST MEALS OF THE DAY .  fesoterodine (TOVIAZ) 4 MG TB24 tablet, Take 4 mg daily by mouth. Marland Kitchen  glucose blood (ONETOUCH VERIO) test strip, Use to check blood sugar 2 times a day. .  hydrocortisone 2.5 % lotion,  .  Olopatadine HCl 0.2 % SOLN,  .  potassium chloride (KLOR-CON) 10 MEQ tablet, Take 1 tablet (10 mEq total) by mouth daily. (Patient not taking: Reported on 01/19/2020) .  Respiratory Therapy Supplies MISC, Use flutter valve as needed to break the coughing cycle. (Patient not taking: Reported on  01/19/2020) .  sulfamethoxazole-trimethoprim (BACTRIM DS) 800-160 MG tablet, Take 1 tablet by mouth 2 (two) times daily. Marland Kitchen  triamcinolone cream (KENALOG) 0.1 %, APPLY 1 APPLICATION TOPICALLY 4 (FOUR) TIMES DAILY AS NEEDED FOR RASH .  UNKNOWN TO PATIENT, Inject as directed every 6 (six) months. 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  Vitamin D, Ergocalciferol, (DRISDOL) 1.25 MG (50000 UNIT) CAPS capsule, TAKE 1 CAPSULE BY MOUTH ONCE WEEKLY   Reviewed prior external information including notes and imaging from  primary care provider As well as notes that were  available from care everywhere and other healthcare systems.  Past medical history, social, surgical and family history all reviewed in electronic medical record.  Escobar pertanent information unless stated regarding to the chief complaint.   Review of Systems:  Escobar headache, visual changes, nausea, vomiting, diarrhea, constipation, dizziness, abdominal pain, skin rash, fevers, chills, night sweats, weight loss, swollen lymph nodes, body aches, joint swelling, chest pain, shortness of breath, mood changes. POSITIVE muscle aches, joint swelling  Objective  Blood pressure 110/82, pulse (!) 106, height 5\' 4"  (1.626 m), SpO2 96 %.   General: Escobar apparent distress alert and oriented x3 mood and affect normal, dressed appropriately.  Patient is uncomfortable at the moment sitting in a wheelchair.  Patient has a positive straight leg test at 25 degrees with radicular symptoms all the way to the heel.  Patient when trying to stand does have more cramping in the hamstring but states that she feels it all the way in her foot when this happens as well as in the lower back.  Patient is tender to palpation diffusely in the lower back but Escobar spinous process tenderness.  Neurovascularly intact distally and patient does have 5 out of 5 dorsi flexion on the left side    Impression and Recommendations:     The above documentation has been reviewed and  is accurate and complete Lyndal Pulley, DO       Note: This dictation was prepared with Dragon dictation along with smaller phrase technology. Any transcriptional errors that result from this process are unintentional.

## 2020-02-13 ENCOUNTER — Ambulatory Visit (INDEPENDENT_AMBULATORY_CARE_PROVIDER_SITE_OTHER): Payer: Medicare Other | Admitting: Family Medicine

## 2020-02-13 ENCOUNTER — Encounter: Payer: Self-pay | Admitting: Family Medicine

## 2020-02-13 ENCOUNTER — Other Ambulatory Visit: Payer: Self-pay | Admitting: Cardiology

## 2020-02-13 ENCOUNTER — Other Ambulatory Visit: Payer: Self-pay

## 2020-02-13 VITALS — BP 128/78 | HR 113 | Ht 64.0 in | Wt 150.0 lb

## 2020-02-13 DIAGNOSIS — G8929 Other chronic pain: Secondary | ICD-10-CM | POA: Diagnosis not present

## 2020-02-13 DIAGNOSIS — M2550 Pain in unspecified joint: Secondary | ICD-10-CM | POA: Diagnosis not present

## 2020-02-13 DIAGNOSIS — M545 Low back pain, unspecified: Secondary | ICD-10-CM

## 2020-02-13 DIAGNOSIS — M5416 Radiculopathy, lumbar region: Secondary | ICD-10-CM | POA: Diagnosis not present

## 2020-02-13 NOTE — Assessment & Plan Note (Signed)
Patient continues to have back pain with sweats appears to be more radicular symptoms.  Patient did have x-rays that did show an anterior listhesis of L3 on L4 that would be consistent with her bilateral hip pain.  Would not be as consistent with the radicular pain to the left knee.  I would like to get laboratory work-up to make sure there is no any type of infectious etiology but I think highly unlikely with no erythema or warmness to touch of the knee.  Patient though will have an MRI of the back and could be a candidate for potential epidurals.  Patient wants to hold on any other medications if possible but will take Tylenol regularly.  Patient will follow up after imaging and will discuss further treatment options.  Once again patient knows if any worsening pain to seek medical attention immediately.

## 2020-02-13 NOTE — Patient Instructions (Signed)
[facility] to schedule MRI  [phone] We will be in touch with results

## 2020-02-13 NOTE — Progress Notes (Signed)
[place] Phone: [phone] Subjective:   [name], am serving as a scribe for Dr. Hulan Saas. This visit occurred during the SARS-CoV-2 public health emergency.  Safety protocols were in place, including screening questions prior to the visit, additional usage of staff PPE, and extensive cleaning of exam room while observing appropriate contact time as indicated for disinfecting solutions.   I'm seeing this patient by the request  of:  [name], FNP  CC: Low back and left leg pain follow-up  GMW:[id number]   02/05/2020  believe the patient is having more of a lumbar radiculopathy causing some more spasms in the patient's hamstring from time to time.  Patient has had some difficulty with this.  Did have the injections of the knees but knee seem to be better.  Actually has increasing range of motion.  Concerned that this is more secondary to the lumbar radiculopathy and patient is on Cymbalta.  Does not want to start gabapentin but given prednisone.  Patient's last hemoglobin A1c was 5.8 so I do think we have room for the steroid at the moment.  Patient given a shot of Toradol and Depo-Medrol today to help with pain and x-rays are pending.  Patient knows if worsening pain to seek medical attention immediately and potentially go to the emergency room.  Discussed possible muscle relaxers but secondary to patient's other comorbidities she would like to avoid especially with her also taking care of other people in her community.  We did discuss potential laboratory work-up which patient declined after discussing that there is low likelihood, patient is following up in 1 week  Update 02/13/2020 Almira Phetteplace is a 74 y.o. female coming in with complaint of back pain. Patient states her pain is in both hips,  Lower back and left knee. Pain is 7/10 this week. Took course of prednisone. Unsure if it helped with  her pain.  Patient states that continues to have pain.  Continues to need a cane to help her with any type of ambulation.  Patient denies any true numbness or tingling.     Past Medical History:  Diagnosis Date  . Alkaline phosphatase deficiency    w/u Ne  . Allergic rhinitis   . Anxiety   . Arthritis   . DM2 (diabetes mellitus, type 2) ([place])   . GERD (gastroesophageal reflux disease)   . Gout   . Hemorrhoids   . Hyperlipidemia   . Hypertension   . Mild pulmonary hypertension ([place])   . NSVT (nonsustained ventricular tachycardia) ([place])   . Osteoporosis   . PVC's (premature ventricular contractions)   . Thyroid nodule    small  . Tubular adenoma of colon 2017  . Urticaria   . Uterine fibroid    Past Surgical History:  Procedure Laterality Date  . CATARACT EXTRACTION Bilateral 2016  . COLONOSCOPY  2007  . echocardiogram (other)  01/16/2002  . 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 stress cardiolite  02/12/2006  . TOENAIL EXCISION    . TUBAL LIGATION    . TYMPANOSTOMY TUBE PLACEMENT     Social History   Socioeconomic History  . Marital status: Widowed    Spouse name: Not on file  . Number of children: 2  . Years of education: Not on file  . Highest education level: Not on file  Occupational History  . Occupation: Surveyor, minerals: UNEMPLOYED  Tobacco Use  . Smoking status: Former Smoker    Packs/day: 0.25    Years: 5.00    Pack years: 1.25    Types: Cigarettes    Quit date: 06/29/1968    Years since quitting: 51.6  . Smokeless tobacco: Never Used  Vaping Use  . Vaping Use: Never used  Substance and Sexual Activity  . Alcohol use: No    Alcohol/week: 0.0 standard drinks  . Drug use: No  . Sexual activity: Not on file  Other Topics Concern  . Not on file  Social History Narrative   Widowed 2010.    Social Determinants of Health   Financial Resource Strain:   . Difficulty of Paying Living Expenses:   Food Insecurity:   . Worried About Paediatric nurse in the Last Year:   . Arboriculturist in the Last Year:   Transportation Needs:   . Film/video editor (Medical):   Marland Kitchen Lack of Transportation (Non-Medical):   Physical Activity:   . Days of Exercise per Week:   . Minutes of Exercise per Session:   Stress:   . Feeling of Stress :   Social Connections:   . Frequency of Communication with Friends and Family:   . Frequency of Social Gatherings with Friends and Family:   . Attends Religious Services:   . Active Member of Clubs or Organizations:   . Attends Archivist Meetings:   Marland Kitchen Marital Status:    Allergies  Allergen Reactions  . Olmesartan Medoxomil     REACTION: headache   Family History  Problem Relation Age of Onset  . Heart disease Mother   . Allergic rhinitis Mother   . Heart disease Father   . Heart disease Maternal Grandfather   . Rectal cancer Maternal Grandfather   . Stomach cancer Maternal Grandmother   . Colon cancer Neg Hx   . Breast cancer Neg Hx     Current Outpatient Medications (Endocrine & Metabolic):  .  metFORMIN (GLUCOPHAGE-XR) 500 MG 24 hr tablet, TAKE TWO TABLETS BY MOUTH TWICE A DAY  Current Outpatient Medications (Cardiovascular):  .  amLODipine (NORVASC) 2.5 MG tablet, TAKE ONE TABLET BY MOUTH DAILY .  rosuvastatin (CRESTOR) 5 MG tablet, TAKE ONE TABLET BY MOUTH DAILY .  valsartan (DIOVAN) 80 MG tablet, TAKE ONE TABLET BY MOUTH DAILY  Current Outpatient Medications (Respiratory):  Marland Kitchen  Azelastine-Fluticasone 137-50 MCG/ACT SUSP, Place 1 spray into the nose 2 (two) times daily. .  benzonatate (TESSALON) 200 MG capsule, Take 1 capsule (200 mg total) by mouth 3 (three) times daily as needed for cough. .  Carbinoxamine Maleate 4 MG TABS, Take 1 tablet (4 mg total) by mouth every 8 (eight) hours as needed. .  Chlorphen-Pseudoephed-APAP (CORICIDIN D PO), Take by mouth as needed.  .  chlorpheniramine (CHLOR-TRIMETON) 4 MG tablet, Take 4 mg by mouth every 4 (four) hours as needed  for allergies.   Current Outpatient Medications (Analgesics):  .  aspirin EC 81 MG tablet, Take 81 mg by mouth daily.   Current Outpatient Medications (Other):  Marland Kitchen  DULoxetine (CYMBALTA) 30 MG capsule, Take 1 capsule (30 mg total) by mouth 2 (two) times daily. Marland Kitchen  esomeprazole (NEXIUM) 40 MG capsule, TAKE ONE CAPSULE BY MOUTH DAILY 30 TO 60 MINUTES BEFORE YOUR FIRST AND LAST MEALS OF THE DAY .  fesoterodine (TOVIAZ) 4 MG TB24 tablet, Take 4 mg daily by mouth. Marland Kitchen  glucose blood (ONETOUCH VERIO) test strip, Use to  check blood sugar 2 times a day. .  hydrocortisone 2.5 % lotion,  .  Olopatadine HCl 0.2 % SOLN,  .  potassium chloride (KLOR-CON) 10 MEQ tablet, Take 1 tablet (10 mEq total) by mouth daily. Marland Kitchen  Respiratory Therapy Supplies MISC, Use flutter valve as needed to break the coughing cycle. .  sulfamethoxazole-trimethoprim (BACTRIM DS) 800-160 MG tablet, Take 1 tablet by mouth 2 (two) times daily. Marland Kitchen  triamcinolone cream (KENALOG) 0.1 %, APPLY 1 APPLICATION TOPICALLY 4 (FOUR) TIMES DAILY AS NEEDED FOR RASH .  UNKNOWN TO PATIENT, Inject as directed every 6 (six) months. 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  Vitamin D, Ergocalciferol, (DRISDOL) 1.25 MG (50000 UNIT) CAPS capsule, TAKE 1 CAPSULE BY MOUTH ONCE WEEKLY   Reviewed prior external information including notes and imaging from  primary care provider As well as notes that were available from care everywhere and other healthcare systems.  Past medical history, social, surgical and family history all reviewed in electronic medical record.  No pertanent information unless stated regarding to the chief complaint.   Review of Systems:  No headache, visual changes, nausea, vomiting, diarrhea, constipation, dizziness, abdominal pain, skin rash, fevers, chills, night sweats, weight loss, swollen lymph nodes,  joint swelling, chest pain, shortness of breath, mood changes. POSITIVE muscle aches, body aches  Objective  Blood  pressure 128/78, pulse (!) 113, height 5\' 4"  (1.626 m), weight 150 lb (68 kg), SpO2 98 %.    General: No apparent distress alert and oriented x3 mood and affect normal, dressed appropriately.  Still appears to be uncomfortable HEENT: Pupils equal, extraocular movements intact  Respiratory: Patient's speak in full sentences and does not appear short of breath  Cardiovascular: Trace edema bilaterally at the ankles Patient still has some mild positive straight leg test on the left side.  Tender to palpation in the paraspinal musculature of the back.  Left knee does show abnormal thigh to calf ratio and some instability with valgus and varus force but patient actually still has fairly good range of motion compared to patient's baseline.  No significant swelling noted.  Tightness noted in the hamstring on the left greater than the right.   Impression and Recommendations:     The above documentation has been reviewed and is accurate and complete Lyndal Pulley, DO       Note: This dictation was prepared with Dragon dictation along with smaller phrase technology. Any transcriptional errors that result from this process are unintentional.

## 2020-02-14 LAB — COMPREHENSIVE METABOLIC PANEL
AG Ratio: 1.4 (calc) (ref 1.0–2.5)
ALT: 28 U/L (ref 6–29)
AST: 23 U/L (ref 10–35)
Albumin: 3.9 g/dL (ref 3.6–5.1)
Alkaline phosphatase (APISO): 120 U/L (ref 37–153)
BUN: 11 mg/dL (ref 7–25)
CO2: 28 mmol/L (ref 20–32)
Calcium: 9.2 mg/dL (ref 8.6–10.4)
Chloride: 100 mmol/L (ref 98–110)
Creat: 0.76 mg/dL (ref 0.60–0.93)
Globulin: 2.8 g/dL (calc) (ref 1.9–3.7)
Glucose, Bld: 185 mg/dL — ABNORMAL HIGH (ref 65–99)
Potassium: 3.5 mmol/L (ref 3.5–5.3)
Sodium: 138 mmol/L (ref 135–146)
Total Bilirubin: 0.5 mg/dL (ref 0.2–1.2)
Total Protein: 6.7 g/dL (ref 6.1–8.1)

## 2020-02-14 LAB — PTH, INTACT AND CALCIUM
Calcium: 9.2 mg/dL (ref 8.6–10.4)
PTH: 41 pg/mL (ref 14–64)

## 2020-02-14 LAB — CBC WITH DIFFERENTIAL/PLATELET
Absolute Monocytes: 810 cells/uL (ref 200–950)
Basophils Absolute: 18 cells/uL (ref 0–200)
Basophils Relative: 0.2 %
Eosinophils Absolute: 214 cells/uL (ref 15–500)
Eosinophils Relative: 2.4 %
HCT: 41.2 % (ref 35.0–45.0)
Hemoglobin: 13.3 g/dL (ref 11.7–15.5)
Lymphs Abs: 1931 cells/uL (ref 850–3900)
MCH: 27.3 pg (ref 27.0–33.0)
MCHC: 32.3 g/dL (ref 32.0–36.0)
MCV: 84.6 fL (ref 80.0–100.0)
MPV: 10.2 fL (ref 7.5–12.5)
Monocytes Relative: 9.1 %
Neutro Abs: 5927 cells/uL (ref 1500–7800)
Neutrophils Relative %: 66.6 %
Platelets: 244 10*3/uL (ref 140–400)
RBC: 4.87 10*6/uL (ref 3.80–5.10)
RDW: 13 % (ref 11.0–15.0)
Total Lymphocyte: 21.7 %
WBC: 8.9 10*3/uL (ref 3.8–10.8)

## 2020-02-14 LAB — URIC ACID: Uric Acid, Serum: 4.6 mg/dL (ref 2.5–7.0)

## 2020-02-14 LAB — SEDIMENTATION RATE: Sed Rate: 28 mm/h (ref 0–30)

## 2020-02-14 LAB — C-REACTIVE PROTEIN: CRP: 9 mg/L — ABNORMAL HIGH (ref <8.0)

## 2020-02-14 LAB — D-DIMER, QUANTITATIVE: D-Dimer, Quant: 0.62 mcg/mL FEU — ABNORMAL HIGH (ref <0.50)

## 2020-02-15 ENCOUNTER — Telehealth: Payer: Self-pay | Admitting: Family Medicine

## 2020-02-15 ENCOUNTER — Other Ambulatory Visit: Payer: Self-pay

## 2020-02-15 DIAGNOSIS — M79605 Pain in left leg: Secondary | ICD-10-CM

## 2020-02-15 MED ORDER — TRAMADOL HCL 50 MG PO TABS: 50.0000 mg | ORAL_TABLET | Freq: Two times a day (BID) | ORAL | 0 refills | Status: DC | PRN

## 2020-02-15 NOTE — Telephone Encounter (Signed)
Sent in tramadol do not use much, up to 2  Times a day but take with Tylenol.  Sent previous note to get a Doppler of the lower extremity and we are awaiting the back work-up.

## 2020-02-15 NOTE — Telephone Encounter (Signed)
Patient returned call.  Given MD response.  Expressed understanding.

## 2020-02-15 NOTE — Telephone Encounter (Signed)
Patient called and left a message (after hours) asking if Dr Tamala Julian would be able to send in something to help with pain because Tylenol does not seem to be helping.  Please advise.

## 2020-02-19 ENCOUNTER — Other Ambulatory Visit: Payer: Self-pay

## 2020-02-19 ENCOUNTER — Other Ambulatory Visit: Payer: Medicare Other

## 2020-02-19 DIAGNOSIS — E785 Hyperlipidemia, unspecified: Secondary | ICD-10-CM | POA: Diagnosis not present

## 2020-02-19 DIAGNOSIS — I10 Essential (primary) hypertension: Secondary | ICD-10-CM | POA: Diagnosis not present

## 2020-02-19 DIAGNOSIS — I2510 Atherosclerotic heart disease of native coronary artery without angina pectoris: Secondary | ICD-10-CM

## 2020-02-19 DIAGNOSIS — E782 Mixed hyperlipidemia: Secondary | ICD-10-CM

## 2020-02-19 DIAGNOSIS — I4729 Other ventricular tachycardia: Secondary | ICD-10-CM

## 2020-02-19 DIAGNOSIS — E7800 Pure hypercholesterolemia, unspecified: Secondary | ICD-10-CM

## 2020-02-19 LAB — LIPID PANEL
Chol/HDL Ratio: 3 ratio (ref 0.0–4.4)
Cholesterol, Total: 166 mg/dL (ref 100–199)
HDL: 56 mg/dL (ref >39)
LDL Chol Calc (NIH): 93 mg/dL (ref 0–99)
Triglycerides: 92 mg/dL (ref 0–149)
VLDL Cholesterol Cal: 17 mg/dL (ref 5–40)

## 2020-02-19 LAB — COMPREHENSIVE METABOLIC PANEL
ALT: 34 IU/L — ABNORMAL HIGH (ref 0–32)
AST: 26 IU/L (ref 0–40)
Albumin/Globulin Ratio: 1.3 (ref 1.2–2.2)
Albumin: 3.9 g/dL (ref 3.7–4.7)
Alkaline Phosphatase: 135 IU/L — ABNORMAL HIGH (ref 48–121)
BUN/Creatinine Ratio: 14 (ref 12–28)
BUN: 10 mg/dL (ref 8–27)
Bilirubin Total: 0.3 mg/dL (ref 0.0–1.2)
CO2: 27 mmol/L (ref 20–29)
Calcium: 9.1 mg/dL (ref 8.7–10.3)
Chloride: 102 mmol/L (ref 96–106)
Creatinine, Ser: 0.71 mg/dL (ref 0.57–1.00)
GFR calc Af Amer: 97 mL/min/{1.73_m2} (ref >59)
GFR calc non Af Amer: 84 mL/min/{1.73_m2} (ref >59)
Globulin, Total: 3.1 g/dL (ref 1.5–4.5)
Glucose: 115 mg/dL — ABNORMAL HIGH (ref 65–99)
Potassium: 3.9 mmol/L (ref 3.5–5.2)
Sodium: 142 mmol/L (ref 134–144)
Total Protein: 7 g/dL (ref 6.0–8.5)

## 2020-02-19 LAB — CBC
Hematocrit: 38.4 % (ref 34.0–46.6)
Hemoglobin: 12.4 g/dL (ref 11.1–15.9)
MCH: 27.3 pg (ref 26.6–33.0)
MCHC: 32.3 g/dL (ref 31.5–35.7)
MCV: 84 fL (ref 79–97)
Platelets: 220 10*3/uL (ref 150–450)
RBC: 4.55 x10E6/uL (ref 3.77–5.28)
RDW: 13.4 % (ref 11.7–15.4)
WBC: 8.6 10*3/uL (ref 3.4–10.8)

## 2020-02-19 LAB — TSH: TSH: 1.86 u[IU]/mL (ref 0.450–4.500)

## 2020-02-19 NOTE — Telephone Encounter (Signed)
Spoke with patient per recommendations. Provided patient with phone number to [facility]. Will be in contact with patient after MRI results. Patient voices understanding.

## 2020-02-19 NOTE — Telephone Encounter (Signed)
Patient called back stating that the Tramadol prescription did not help the pain. She asked if it would be okay for her to take Aleve?  Please advise.

## 2020-02-20 ENCOUNTER — Ambulatory Visit: Payer: Medicare Other | Admitting: Orthotics

## 2020-02-20 ENCOUNTER — Telehealth: Payer: Self-pay | Admitting: *Deleted

## 2020-02-20 DIAGNOSIS — R7989 Other specified abnormal findings of blood chemistry: Secondary | ICD-10-CM

## 2020-02-20 NOTE — Telephone Encounter (Signed)
-----   Message from Dorothy Spark, MD sent at 02/19/2020  8:16 PM EDT ----- Mildly elevated LFTs, I would continue the same dose of rosuvastatin and order abdominal US, thank you

## 2020-02-20 NOTE — Telephone Encounter (Signed)
Pt has been notified of lab results by phone with verbal understanding to plan of care. Pt agreeable to abdominal U/S. I will place order for U/S. Pt would like to wait until next week for U/S. Pt has appt tomorrow with Dr. Meda Coffee 02/21/20. Pt thanked me for the call. Patient notified of result.  Please refer to phone note from today for complete details.   Julaine Hua, [facility] 02/20/2020 9:51 AM

## 2020-02-21 ENCOUNTER — Ambulatory Visit: Payer: Medicare Other | Admitting: Orthotics

## 2020-02-21 ENCOUNTER — Encounter: Payer: Self-pay | Admitting: Cardiology

## 2020-02-21 ENCOUNTER — Other Ambulatory Visit: Payer: Self-pay

## 2020-02-21 ENCOUNTER — Ambulatory Visit (INDEPENDENT_AMBULATORY_CARE_PROVIDER_SITE_OTHER): Payer: Medicare Other | Admitting: Cardiology

## 2020-02-21 VITALS — BP 122/70 | HR 105 | Ht 64.5 in | Wt 150.0 lb

## 2020-02-21 DIAGNOSIS — I2510 Atherosclerotic heart disease of native coronary artery without angina pectoris: Secondary | ICD-10-CM

## 2020-02-21 DIAGNOSIS — E785 Hyperlipidemia, unspecified: Secondary | ICD-10-CM

## 2020-02-21 DIAGNOSIS — R7989 Other specified abnormal findings of blood chemistry: Secondary | ICD-10-CM

## 2020-02-21 DIAGNOSIS — E1142 Type 2 diabetes mellitus with diabetic polyneuropathy: Secondary | ICD-10-CM

## 2020-02-21 DIAGNOSIS — I10 Essential (primary) hypertension: Secondary | ICD-10-CM | POA: Diagnosis not present

## 2020-02-21 DIAGNOSIS — E118 Type 2 diabetes mellitus with unspecified complications: Secondary | ICD-10-CM

## 2020-02-21 MED ORDER — CARVEDILOL 6.25 MG PO TABS: 6.2500 mg | ORAL_TABLET | Freq: Two times a day (BID) | ORAL | 2 refills | Status: DC

## 2020-02-21 NOTE — Progress Notes (Signed)
Cardiology Office Note    Date:  02/21/2020   ID:  Tajah, Noguchi September 17, 1945, MRN [id number]  PCP:  [name], [place]  Cardiologist: Ena Dawley, MD EPS: None  Chief complain: Tachycardia, left lower extremity pain and swelling  History of Present Illness:  Nkechi Linehan Vadala is a 74 y.o. female with history of hypertension, hyperlipidemia, IDDM, moderate pulmonary hypertension, moderate nonobstructive CAD, coronary CTA 2018 calcium score 220 which is 81st percentile for age and sex match control, moderate plaque in the ostial left main and mild plaque in the ostial RCA.  Patient saw Dr. Meda Coffee 02/23/2019 complaining of dyspnea on exertion and some chest pain.  Nuclear stress test was ordered.  Also 2D echo because of moderate pulmonary hypertension.  2D echo 03/10/2019 normal LVEF 55 to 60% borderline enlarged pulmonary artery, normal right ventricular systolic pressure.  NST was normal.  Patient is coming back after 6 months, since the last visit she has been experiencing left lower extremity swelling behind her knee area and also pain.  She is scheduled to have venous ultrasound done this week to evaluate for DVT.Marland Kitchen  She is currently really limited in her activities.  She has noticed to be tachycardic most of the time.  She denies any chest pain, shortness of breath other than on moderate exertion, lower extremity edema orthopnea or proximal nocturnal dyspnea.  Past Medical History:  Diagnosis Date  . Alkaline phosphatase deficiency    w/u Ne  . Allergic rhinitis   . Anxiety   . Arthritis   . DM2 (diabetes mellitus, type 2) ([place])   . GERD (gastroesophageal reflux disease)   . Gout   . Hemorrhoids   . Hyperlipidemia   . Hypertension   . Mild pulmonary hypertension ([place])   . NSVT (nonsustained ventricular tachycardia) ([place])   . Osteoporosis   . PVC's (premature ventricular contractions)   . Thyroid nodule    small  . Tubular adenoma of colon 2017  . Urticaria    . Uterine fibroid     Past Surgical History:  Procedure Laterality Date  . CATARACT EXTRACTION Bilateral 2016  . COLONOSCOPY  2007  . echocardiogram (other)  01/16/2002  . 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 stress cardiolite  02/12/2006  . TOENAIL EXCISION    . TUBAL LIGATION    . TYMPANOSTOMY TUBE PLACEMENT      Current Medications: Current Meds  Medication Sig  . aspirin EC 81 MG tablet Take 81 mg by mouth daily.  . Azelastine-Fluticasone 137-50 MCG/ACT SUSP Place 1 spray into the nose 2 (two) times daily.  . benzonatate (TESSALON) 200 MG capsule Take 1 capsule (200 mg total) by mouth 3 (three) times daily as needed for cough.  . Carbinoxamine Maleate 4 MG TABS Take 1 tablet (4 mg total) by mouth every 8 (eight) hours as needed.  . Chlorphen-Pseudoephed-APAP (CORICIDIN D PO) Take by mouth as needed.   . DULoxetine (CYMBALTA) 30 MG capsule Take 1 capsule (30 mg total) by mouth 2 (two) times daily.  Marland Kitchen esomeprazole (NEXIUM) 40 MG capsule TAKE ONE CAPSULE BY MOUTH DAILY 30 TO 60 MINUTES BEFORE YOUR FIRST AND LAST MEALS OF THE DAY  . fesoterodine (TOVIAZ) 4 MG TB24 tablet Take 4 mg daily by mouth.  Marland Kitchen glucose blood (ONETOUCH VERIO) test strip Use to check blood sugar 2 times a day.  . hydrocortisone 2.5 % lotion   . metFORMIN (GLUCOPHAGE-XR) 500 MG 24 hr tablet TAKE TWO  TABLETS BY MOUTH TWICE A DAY  . Olopatadine HCl 0.2 % SOLN   . Respiratory Therapy Supplies MISC Use flutter valve as needed to break the coughing cycle.  . rosuvastatin (CRESTOR) 5 MG tablet TAKE ONE TABLET BY MOUTH DAILY  . triamcinolone cream (KENALOG) 0.1 % APPLY 1 APPLICATION TOPICALLY 4 (FOUR) TIMES DAILY AS NEEDED FOR RASH  . UNKNOWN TO PATIENT Inject as directed every 6 (six) months. 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 valsartan (DIOVAN) 80 MG tablet TAKE ONE TABLET BY MOUTH DAILY  . Vitamin D, Ergocalciferol, (DRISDOL) 1.25 MG (50000 UNIT) CAPS capsule TAKE 1 CAPSULE BY MOUTH ONCE  WEEKLY  . [DISCONTINUED] amLODipine (NORVASC) 2.5 MG tablet TAKE ONE TABLET BY MOUTH DAILY     Allergies:   Olmesartan medoxomil   Social History   Socioeconomic History  . Marital status: Widowed    Spouse name: Not on file  . Number of children: 2  . Years of education: Not on file  . Highest education level: Not on file  Occupational History  . Occupation: Surveyor, minerals: UNEMPLOYED  Tobacco Use  . Smoking status: Former Smoker    Packs/day: 0.25    Years: 5.00    Pack years: 1.25    Types: Cigarettes    Quit date: 06/29/1968    Years since quitting: 51.6  . Smokeless tobacco: Never Used  Vaping Use  . Vaping Use: Never used  Substance and Sexual Activity  . Alcohol use: No    Alcohol/week: 0.0 standard drinks  . Drug use: No  . Sexual activity: Not on file  Other Topics Concern  . Not on file  Social History Narrative   Widowed 2010.    Social Determinants of Health   Financial Resource Strain:   . Difficulty of Paying Living Expenses: Not on file  Food Insecurity:   . Worried About Charity fundraiser in the Last Year: Not on file  . Ran Out of Food in the Last Year: Not on file  Transportation Needs:   . Lack of Transportation (Medical): Not on file  . Lack of Transportation (Non-Medical): Not on file  Physical Activity:   . Days of Exercise per Week: Not on file  . Minutes of Exercise per Session: Not on file  Stress:   . Feeling of Stress : Not on file  Social Connections:   . Frequency of Communication with Friends and Family: Not on file  . Frequency of Social Gatherings with Friends and Family: Not on file  . Attends Religious Services: Not on file  . Active Member of Clubs or Organizations: Not on file  . Attends Archivist Meetings: Not on file  . Marital Status: Not on file     Family History:  The patient's   family history includes Allergic rhinitis in her mother; Heart disease in her father, maternal grandfather, and  mother; Rectal cancer in her maternal grandfather; Stomach cancer in her maternal grandmother.   ROS:   Please see the history of present illness.    ROS All other systems reviewed and are negative.   PHYSICAL EXAM:   VS:  BP 122/70   Pulse (!) 105   Ht 5' 4.5" (1.638 m)   Wt 150 lb (68 kg)   SpO2 97%   BMI 25.35 kg/m   Physical Exam  GEN: Well nourished, well developed, in no acute distress  Neck: no JVD, carotid bruits, or masses  Cardiac:RRR; no murmurs, rubs, or gallops  Respiratory:  clear to auscultation bilaterally, normal work of breathing GI: soft, nontender, nondistended, + BS Ext: without cyanosis, clubbing, or edema, Good distal pulses bilaterally Neuro:  Alert and Oriented x 3 Psych: euthymic mood, full affect  Wt Readings from Last 3 Encounters:  02/21/20 150 lb (68 kg)  02/13/20 150 lb (68 kg)  02/02/20 152 lb 3.2 oz (69 kg)    Studies/Labs Reviewed:   EKG:  EKG today shows sinus tachycardia 105 bpm, otherwise normal EKG..    Recent Labs: 12/15/2019: Magnesium 1.9; Pro B Natriuretic peptide (BNP) 33.0 02/19/2020: ALT 34; BUN 10; Creatinine, Ser 0.71; Hemoglobin 12.4; Platelets 220; Potassium 3.9; Sodium 142; TSH 1.860   Lipid Panel    Component Value Date/Time   CHOL 166 02/19/2020 0802   TRIG 92 02/19/2020 0802   TRIG 48 05/03/2006 0733   HDL 56 02/19/2020 0802   CHOLHDL 3.0 02/19/2020 0802   CHOLHDL 3 07/04/2018 1554   VLDL 12.8 07/04/2018 1554   LDLCALC 93 02/19/2020 0802   LDLDIRECT 165.6 06/19/2009 0731    Additional studies/ records that were reviewed today include:  NST 03/10/2019  Nuclear stress EF: 59%.  There was no ST segment deviation noted during stress.  The study is normal.  This is a low risk study.  The left ventricular ejection fraction is normal (55-65%).   Normal stress nuclear study with no ischemia or infarction.  Gated ejection fraction 59% with normal wall motion.    Nuclear History and Indications   2D echo  03/10/2019 IMPRESSIONS     1. The left ventricle has normal systolic function, with an ejection  fraction of 55-60%. The cavity size was normal. There is mild concentric  left ventricular hypertrophy. Left ventricular diastolic Doppler  parameters are indeterminate. No evidence of  left ventricular regional wall motion abnormalities.   2. Trivial pericardial effusion is present.   3. The aortic valve is tricuspid. No stenosis of the aortic valve.   4. The aorta is normal unless otherwise noted.   5. The aortic root, ascending aorta and aortic arch are normal in size  and structure.   6. Borderline enlarged pulmonary artery.   FINDINGS   Left Ventricle: The left ventricle has normal systolic function, with an  ejection fraction of 55-60%. The cavity size was normal. There is mild  concentric left ventricular hypertrophy. Left ventricular diastolic  Doppler parameters are indeterminate. No  evidence of left ventricular regional wall motion abnormalities..   Right Ventricle: The right ventricle has low normal systolic function. The  cavity was normal. There is no increase in right ventricular wall  thickness. Right ventricular systolic pressure is normal.   Left Atrium: Left atrial size was normal in size.   Right Atrium: Right atrial size was normal in size.   Interatrial Septum: No atrial level shunt detected by color flow Doppler.   Pericardium: Trivial pericardial effusion is present. There is a  pericardial fat pad noted.   Mitral Valve: The mitral valve is normal in structure. Mitral valve  regurgitation is trivial by color flow Doppler.   Tricuspid Valve: The tricuspid valve is normal in structure. Tricuspid  valve regurgitation is mild by color flow Doppler.   Aortic Valve: The aortic valve is tricuspid Aortic valve regurgitation was  not visualized by color flow Doppler. There is No stenosis of the aortic  valve.   Pulmonic Valve: The pulmonic valve was grossly  normal. Pulmonic valve  regurgitation  is not visualized by color flow Doppler. No evidence of  pulmonic stenosis.   Aorta: The aortic root, ascending aorta and aortic arch are normal in size  and structure. The aorta is normal unless otherwise noted.   Pulmonary Artery: The pulmonary artery is borderline enlarged.       ASSESSMENT:    1. Elevated LFTs   2. Coronary artery disease involving native coronary artery of native heart without angina pectoris   3. Essential hypertension   4. Hyperlipidemia, unspecified hyperlipidemia type   5. CAD in native artery     PLAN:  In order of problems listed above:  CAD nonobstructive on coronary CTA 2015, normal NST 02/2019, chest pain felt to be muscular and improved with PT. no further work-up needed at this time, she is tachycardic we will start  Carvedilol 6.25 p.o. twice daily and discontinue amlodipine 2.5 mg daily.  Essential hypertension BP controlled -changes as about to control her tachycardia.Marland Kitchen  History of pulmonary hypertension echo 12/3417 RV systolic pressure is normal  Hyperlipidemia - on low-dose Crestor 5 mg daily.  Her lipids on February 19, 2020 are LDL is 93, triglycerides 92 and HDL 56.  AST is 34, ALP 135.  LFTs elevation is very minimal, however in addition to her recent weight loss, we will order abdominal ultrasound.  Medication Adjustments/Labs and Tests Ordered: Current medicines are reviewed at length with the patient today.  Concerns regarding medicines are outlined above.  Medication changes, Labs and Tests ordered today are listed in the Patient Instructions below. Patient Instructions  Medication Instructions:   STOP TAKING AMLODIPINE NOW  START TAKING CARVEDILOL 6.25 MG BY MOUTH TWICE DAILY  *If you need a refill on your cardiac medications before your next appointment, please call your pharmacy*  Lab Work:  IN 6 MONTHS--PRIOR TO YOUR 6 MONTH FOLLOW-UP APPOINTMENT WITH DR. Andree Coss  WILL ONLY CHECK A  CMET AT THAT TIME.   If you have labs (blood work) drawn today and your tests are completely normal, you will receive your results only by: Marland Kitchen MyChart Message (if you have MyChart) OR . A paper copy in the mail If you have any lab test that is abnormal or we need to change your treatment, we will call you to review the results.  Testing/Procedures:  SCHEDULING PLEASE GO AHEAD AND SCHEDULE HER ABDOMINAL US THAT WAS ORDERED BY [place] ON 02/20/20, PER DR. Meda Coffee   Follow-Up:  IN 6 MONTHS WITH DR. Meda Coffee IN THE OFFICE--PLEASE HAVE YOUR LAB DONE A FEW DAYS PRIOR TO THIS OFFICE VISIT PER DR. Meda Coffee     Signed, Ena Dawley, MD  02/21/2020 5:28 PM    [facility] [place], [place], [place]  [address] Phone: [phone]; Fax: [phone]

## 2020-02-21 NOTE — Progress Notes (Signed)
Reorder shoe 1 size smaller

## 2020-02-21 NOTE — Patient Instructions (Signed)
Medication Instructions:   STOP TAKING AMLODIPINE NOW  START TAKING CARVEDILOL 6.25 MG BY MOUTH TWICE DAILY  *If you need a refill on your cardiac medications before your next appointment, please call your pharmacy*  Lab Work:  IN 6 MONTHS--PRIOR TO YOUR 6 MONTH FOLLOW-UP APPOINTMENT WITH DR. Andree Coss  WILL ONLY CHECK A CMET AT THAT TIME.   If you have labs (blood work) drawn today and your tests are completely normal, you will receive your results only by: Marland Kitchen MyChart Message (if you have MyChart) OR . A paper copy in the mail If you have any lab test that is abnormal or we need to change your treatment, we will call you to review the results.  Testing/Procedures:  SCHEDULING PLEASE GO AHEAD AND SCHEDULE HER ABDOMINAL US THAT WAS ORDERED BY [place] ON 02/20/20, PER DR. Meda Coffee   Follow-Up:  IN 6 MONTHS WITH DR. Meda Coffee IN THE OFFICE--PLEASE HAVE YOUR LAB DONE A FEW DAYS PRIOR TO THIS OFFICE VISIT PER DR. Meda Coffee

## 2020-02-22 ENCOUNTER — Ambulatory Visit (HOSPITAL_COMMUNITY)
Admission: RE | Admit: 2020-02-22 | Discharge: 2020-02-22 | Disposition: A | Discharge status: Home / Self Care | Payer: Medicare Other | Source: Ambulatory Visit | Attending: Cardiology | Admitting: Cardiology

## 2020-02-22 DIAGNOSIS — M79605 Pain in left leg: Secondary | ICD-10-CM | POA: Insufficient documentation

## 2020-02-26 ENCOUNTER — Ambulatory Visit: Payer: Medicare Other | Admitting: Cardiology

## 2020-02-28 ENCOUNTER — Ambulatory Visit
Admission: RE | Admit: 2020-02-28 | Discharge: 2020-02-28 | Disposition: A | Discharge status: Home / Self Care | Payer: Medicare Other | Source: Ambulatory Visit | Attending: Cardiology | Admitting: Cardiology

## 2020-02-28 DIAGNOSIS — K7689 Other specified diseases of liver: Secondary | ICD-10-CM | POA: Diagnosis not present

## 2020-02-28 DIAGNOSIS — R7989 Other specified abnormal findings of blood chemistry: Secondary | ICD-10-CM | POA: Diagnosis not present

## 2020-02-28 DIAGNOSIS — I700 Atherosclerosis of aorta: Secondary | ICD-10-CM | POA: Diagnosis not present

## 2020-03-03 ENCOUNTER — Ambulatory Visit
Admission: RE | Admit: 2020-03-03 | Discharge: 2020-03-03 | Disposition: A | Discharge status: Home / Self Care | Payer: Medicare Other | Source: Ambulatory Visit | Attending: Family Medicine | Admitting: Family Medicine

## 2020-03-03 ENCOUNTER — Other Ambulatory Visit: Payer: Self-pay

## 2020-03-03 DIAGNOSIS — M48061 Spinal stenosis, lumbar region without neurogenic claudication: Secondary | ICD-10-CM | POA: Diagnosis not present

## 2020-03-03 DIAGNOSIS — M5416 Radiculopathy, lumbar region: Secondary | ICD-10-CM

## 2020-03-04 NOTE — Progress Notes (Deleted)
[place] Phone: [phone] Subjective:    I'm seeing this patient by the request  of:  [name], FNP  CC: Bilateral knee pain, back pain follow-up  TLX:[id number]  Amy Escobar is a 74 y.o. female coming in with complaint of ***  Onset-  Location Duration-  Character- Aggravating factors- Reliving factors-  Therapies tried-  Severity-   Patient was having ongoing back pain that was severe and not responding to conservative therapy.  Patient did have an MRI done 2 days ago.  This was independently visualized by me of the MRI of the lumbar spine showing mild L4-L5 was spinal stenosis mild degenerative changes otherwise  Past Medical History:  Diagnosis Date  . Alkaline phosphatase deficiency    w/u Ne  . Allergic rhinitis   . Anxiety   . Arthritis   . DM2 (diabetes mellitus, type 2) ([place])   . GERD (gastroesophageal reflux disease)   . Gout   . Hemorrhoids   . Hyperlipidemia   . Hypertension   . Mild pulmonary hypertension ([place])   . NSVT (nonsustained ventricular tachycardia) ([place])   . Osteoporosis   . PVC's (premature ventricular contractions)   . Thyroid nodule    small  . Tubular adenoma of colon 2017  . Urticaria   . Uterine fibroid    Past Surgical History:  Procedure Laterality Date  . CATARACT EXTRACTION Bilateral 2016  . COLONOSCOPY  2007  . echocardiogram (other)  01/16/2002  . 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 stress cardiolite  02/12/2006  . TOENAIL EXCISION    . TUBAL LIGATION    . TYMPANOSTOMY TUBE PLACEMENT     Social History   Socioeconomic History  . Marital status: Widowed    Spouse name: Not on file  . Number of children: 2  . Years of education: Not on file  . Highest education level: Not on file  Occupational History  . Occupation: Surveyor, minerals: UNEMPLOYED  Tobacco Use  . Smoking status: Former Smoker    Packs/day: 0.25     Years: 5.00    Pack years: 1.25    Types: Cigarettes    Quit date: 06/29/1968    Years since quitting: 51.7  . Smokeless tobacco: Never Used  Vaping Use  . Vaping Use: Never used  Substance and Sexual Activity  . Alcohol use: No    Alcohol/week: 0.0 standard drinks  . Drug use: No  . Sexual activity: Not on file  Other Topics Concern  . Not on file  Social History Narrative   Widowed 2010.    Social Determinants of Health   Financial Resource Strain:   . Difficulty of Paying Living Expenses: Not on file  Food Insecurity:   . Worried About Charity fundraiser in the Last Year: Not on file  . Ran Out of Food in the Last Year: Not on file  Transportation Needs:   . Lack of Transportation (Medical): Not on file  . Lack of Transportation (Non-Medical): Not on file  Physical Activity:   . Days of Exercise per Week: Not on file  . Minutes of Exercise per Session: Not on file  Stress:   . Feeling of Stress : Not on file  Social Connections:   . Frequency of Communication with Friends and Family: Not on file  . Frequency of Social Gatherings with Friends and Family: Not on file  .  Attends Religious Services: Not on file  . Active Member of Clubs or Organizations: Not on file  . Attends Archivist Meetings: Not on file  . Marital Status: Not on file   Allergies  Allergen Reactions  . Olmesartan Medoxomil     REACTION: headache   Family History  Problem Relation Age of Onset  . Heart disease Mother   . Allergic rhinitis Mother   . Heart disease Father   . Heart disease Maternal Grandfather   . Rectal cancer Maternal Grandfather   . Stomach cancer Maternal Grandmother   . Colon cancer Neg Hx   . Breast cancer Neg Hx     Current Outpatient Medications (Endocrine & Metabolic):  .  metFORMIN (GLUCOPHAGE-XR) 500 MG 24 hr tablet, TAKE TWO TABLETS BY MOUTH TWICE A DAY  Current Outpatient Medications (Cardiovascular):  .  carvedilol (COREG) 6.25 MG tablet, Take 1  tablet (6.25 mg total) by mouth 2 (two) times daily. .  rosuvastatin (CRESTOR) 5 MG tablet, TAKE ONE TABLET BY MOUTH DAILY .  valsartan (DIOVAN) 80 MG tablet, TAKE ONE TABLET BY MOUTH DAILY  Current Outpatient Medications (Respiratory):  Marland Kitchen  Azelastine-Fluticasone 137-50 MCG/ACT SUSP, Place 1 spray into the nose 2 (two) times daily. .  benzonatate (TESSALON) 200 MG capsule, Take 1 capsule (200 mg total) by mouth 3 (three) times daily as needed for cough. .  Carbinoxamine Maleate 4 MG TABS, Take 1 tablet (4 mg total) by mouth every 8 (eight) hours as needed. .  Chlorphen-Pseudoephed-APAP (CORICIDIN D PO), Take by mouth as needed.   Current Outpatient Medications (Analgesics):  .  aspirin EC 81 MG tablet, Take 81 mg by mouth daily.   Current Outpatient Medications (Other):  Marland Kitchen  DULoxetine (CYMBALTA) 30 MG capsule, Take 1 capsule (30 mg total) by mouth 2 (two) times daily. Marland Kitchen  esomeprazole (NEXIUM) 40 MG capsule, TAKE ONE CAPSULE BY MOUTH DAILY 30 TO 60 MINUTES BEFORE YOUR FIRST AND LAST MEALS OF THE DAY .  fesoterodine (TOVIAZ) 4 MG TB24 tablet, Take 4 mg daily by mouth. Marland Kitchen  glucose blood (ONETOUCH VERIO) test strip, Use to check blood sugar 2 times a day. .  hydrocortisone 2.5 % lotion,  .  Olopatadine HCl 0.2 % SOLN,  .  Respiratory Therapy Supplies MISC, Use flutter valve as needed to break the coughing cycle. .  triamcinolone cream (KENALOG) 0.1 %, APPLY 1 APPLICATION TOPICALLY 4 (FOUR) TIMES DAILY AS NEEDED FOR RASH .  UNKNOWN TO PATIENT, Inject as directed every 6 (six) months. 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  Vitamin D, Ergocalciferol, (DRISDOL) 1.25 MG (50000 UNIT) CAPS capsule, TAKE 1 CAPSULE BY MOUTH ONCE WEEKLY   Reviewed prior external information including notes and imaging from  primary care provider As well as notes that were available from care everywhere and other healthcare systems.  Past medical history, social, surgical and family history all reviewed in  electronic medical record.  No pertanent information unless stated regarding to the chief complaint.   Review of Systems:  No headache, visual changes, nausea, vomiting, diarrhea, constipation, dizziness, abdominal pain, skin rash, fevers, chills, night sweats, weight loss, swollen lymph nodes, body aches, joint swelling, chest pain, shortness of breath, mood changes. POSITIVE muscle aches  Objective  There were no vitals taken for this visit.   General: No apparent distress alert and oriented x3 mood and affect normal, dressed appropriately.  HEENT: Pupils equal, extraocular movements intact  Respiratory: Patient's speak in full sentences  and does not appear short of breath  Cardiovascular: No lower extremity edema, non tender, no erythema  Neuro: Cranial nerves II through XII are intact, neurovascularly intact in all extremities with 2+ DTRs and 2+ pulses.  Gait antalgic MSK:   Knee: valgus deformity noted. Large thigh to calf ratio.  Tender to palpation over medial and PF joint line.  ROM full in flexion and extension and lower leg rotation. instability with valgus force.  painful patellar compression. Patellar glide with moderate crepitus. Patellar and quadriceps tendons unremarkable. Hamstring and quadriceps strength is normal. Contralateral knee shows  Low back exam shows some loss of lordosis.   Impression and Recommendations:     The above documentation has been reviewed and is accurate and complete Lyndal Pulley, DO       Note: This dictation was prepared with Dragon dictation along with smaller phrase technology. Any transcriptional errors that result from this process are unintentional.

## 2020-03-05 ENCOUNTER — Other Ambulatory Visit: Payer: Self-pay

## 2020-03-05 ENCOUNTER — Ambulatory Visit: Payer: Medicare Other | Admitting: Family Medicine

## 2020-03-05 DIAGNOSIS — M5416 Radiculopathy, lumbar region: Secondary | ICD-10-CM

## 2020-03-06 ENCOUNTER — Ambulatory Visit: Payer: Medicare Other | Admitting: Endocrinology

## 2020-03-06 ENCOUNTER — Other Ambulatory Visit: Payer: Self-pay | Admitting: Family Medicine

## 2020-03-08 ENCOUNTER — Telehealth: Payer: Self-pay | Admitting: Podiatry

## 2020-03-08 NOTE — Telephone Encounter (Signed)
Pt left message wants to cxl appt for 9.13 @ 3 to puds because she is having a procedure on her back.   I returned call and left message for pt that the appt is canceled and to call to reschedule.

## 2020-03-11 ENCOUNTER — Ambulatory Visit: Payer: Medicare Other | Admitting: Orthotics

## 2020-03-11 ENCOUNTER — Other Ambulatory Visit: Payer: Self-pay

## 2020-03-11 ENCOUNTER — Ambulatory Visit
Admission: RE | Admit: 2020-03-11 | Discharge: 2020-03-11 | Disposition: A | Discharge status: Home / Self Care | Payer: Medicare Other | Source: Ambulatory Visit | Attending: Family Medicine | Admitting: Family Medicine

## 2020-03-11 DIAGNOSIS — M5416 Radiculopathy, lumbar region: Secondary | ICD-10-CM

## 2020-03-11 DIAGNOSIS — M48061 Spinal stenosis, lumbar region without neurogenic claudication: Secondary | ICD-10-CM | POA: Diagnosis not present

## 2020-03-11 MED ORDER — METHYLPREDNISOLONE ACETATE 40 MG/ML INJ SUSP (RADIOLOG
120.0000 mg | Freq: Once | INTRAMUSCULAR | Status: AC
  Administered 2020-03-11: 120 mg via EPIDURAL

## 2020-03-11 MED ORDER — IOPAMIDOL (ISOVUE-M 200) INJECTION 41%
1.0000 mL | Freq: Once | INTRAMUSCULAR | Status: AC
  Administered 2020-03-11: 1 mL via EPIDURAL

## 2020-03-11 NOTE — Discharge Instructions (Signed)

## 2020-03-13 ENCOUNTER — Telehealth: Payer: Self-pay | Admitting: Podiatry

## 2020-03-13 NOTE — Telephone Encounter (Signed)
Pt left message wanting to r/s her appt to pick up diabetic shoes.   I returned call and left message for pt to call to schedule an appt.

## 2020-03-14 ENCOUNTER — Telehealth: Payer: Self-pay | Admitting: Podiatry

## 2020-03-14 NOTE — Telephone Encounter (Signed)
Pt left message stating she could come today to pick up her diabetic shoes.  I returned call and left message for pt to call me to schedule an appt but I do not have availability until Monday morning.

## 2020-03-18 ENCOUNTER — Other Ambulatory Visit: Payer: Self-pay

## 2020-03-18 ENCOUNTER — Ambulatory Visit (INDEPENDENT_AMBULATORY_CARE_PROVIDER_SITE_OTHER): Payer: Medicare Other | Admitting: Orthotics

## 2020-03-18 DIAGNOSIS — E118 Type 2 diabetes mellitus with unspecified complications: Secondary | ICD-10-CM

## 2020-03-18 DIAGNOSIS — M21072 Valgus deformity, not elsewhere classified, left ankle: Secondary | ICD-10-CM | POA: Diagnosis not present

## 2020-03-18 DIAGNOSIS — E1142 Type 2 diabetes mellitus with diabetic polyneuropathy: Secondary | ICD-10-CM

## 2020-03-18 DIAGNOSIS — M2010 Hallux valgus (acquired), unspecified foot: Secondary | ICD-10-CM

## 2020-03-18 DIAGNOSIS — M2011 Hallux valgus (acquired), right foot: Secondary | ICD-10-CM | POA: Diagnosis not present

## 2020-03-18 NOTE — Progress Notes (Signed)

## 2020-03-27 ENCOUNTER — Other Ambulatory Visit: Payer: Self-pay | Admitting: Internal Medicine

## 2020-03-27 DIAGNOSIS — R058 Other specified cough: Secondary | ICD-10-CM

## 2020-03-27 NOTE — Telephone Encounter (Signed)
Dr. Wert, please advise if you are okay refilling med. 

## 2020-03-29 ENCOUNTER — Other Ambulatory Visit (INDEPENDENT_AMBULATORY_CARE_PROVIDER_SITE_OTHER): Payer: Self-pay | Admitting: Ophthalmology

## 2020-04-02 ENCOUNTER — Telehealth: Payer: Self-pay | Admitting: Internal Medicine

## 2020-04-02 NOTE — Telephone Encounter (Signed)
LMTCB x1 for pt.  

## 2020-04-03 NOTE — Telephone Encounter (Signed)
Called and spoke with patient, provided recommendations per Dr. Melvyn Novas.  She stated she will go get some Delsym and try that first.  She will keep her ov.  Nothing further needed.

## 2020-04-03 NOTE — Telephone Encounter (Signed)
Delsym is the strongest non-narcotic should try this first   Can probably get robitussin with codeine from pharmacy once s prescription if show ID but I can't call anything in stronger until seen

## 2020-04-03 NOTE — Telephone Encounter (Signed)
Primary Pulmonologist:  Wert Last office visit and with whom: Wert 05/08/2019 What do we see them for (pulmonary problems): Upper airway cough Last OV assessment/plan:  Assessment & Plan Note by Tanda Rockers, MD at 05/09/2019 6:09 AM Author: Tanda Rockers, MD Author Type: Physician Filed: 05/09/2019 6:10 AM  Note Status: Written Cosign: Cosign Not Required Encounter Date: 05/08/2019  Problem: DOE (dyspnea on exertion)  Editor: Tanda Rockers, MD (Physician)               Onset 2015 - spirometry 08/04/13 truncated in a non-physiologic pattern  - 08/08/2013  Walked RA x 3 laps @ 185 ft each stopped due to end of study no desat  - PFT's 09/12/2013 wnl  - 05/08/2019   Walked RA  2 laps @  approx 26ft each @ avg pace  stopped due to  End of study, mild sob with sats still 93%  And nl cxr   No evidence to support asthma /copd or ILD, pulmonary f/u can be prn   I had an extended discussion with the patient reviewing all relevant studies completed to date and  lasting 15 to 20 minutes of a 25 minute final summary office  visit  which included directly observing ambulatory 02 saturation study documented in a/p section of  today's  office note.  Each maintenance medication was reviewed in detail including most importantly the difference between maintenance and prns and under what circumstances the prns are to be triggered using an action plan format that is not reflected in the computer generated alphabetically organized AVS.     Please see AVS for specific instructions unique to this visit that I personally wrote and verbalized to the the pt in detail and then reviewed with pt  by my nurse highlighting any changes in therapy recommended at today's visit .      Patient Instructions by Tanda Rockers, MD at 05/08/2019 11:00 AM Author: Tanda Rockers, MD Author Type: Physician Filed: 05/09/2019 6:05 AM  Note Status: Addendum Cosign: Cosign Not Required Encounter Date: 05/08/2019  Editor:  Tanda Rockers, MD (Physician)      Prior Versions: 1. Tanda Rockers, MD (Physician) at 05/08/2019 11:43 AM - Addendum   2. Tanda Rockers, MD (Physician) at 05/08/2019 11:42 AM - Signed    Chlorpheniramine should be to take 2 bedtime   Continue singulair 10 mg each pm   During the day can take tessalon 200 mg every 6 hours if needed   We will be referring you to Dr Neldon Mc   Please remember to go to the  x-ray department  for your tests - we will call you with the results when they are available    Follow up here is as needed once you establish with Dr Neldon Mc     Instructions  Chlorpheniramine should be to take 2 bedtime   Continue singulair 10 mg each pm   During the day can take tessalon 200 mg every 6 hours if needed   We will be referring you to Dr Neldon Mc   Please remember to go to the  x-ray department  for your tests - we will call you with the results when they are available    Follow up here is as needed once you establish with Dr Neldon Mc        Was appointment offered to patient (explain)?  Yes, 04/05/20 with Rexene Edison NP   Reason for call: Cough with clear phlem, unsure  if it is reflux.  She is taking Nexium 40mg  Bid.  She was taken off of Singular by Dr. Starling Manns.  She is using the Benzonate capsules with no relief.  She is requesting a cough syrup between now and Friday until she can see Tammy.  She denies any fever, chills, body aches or sick contacts.  She has received both of her covid vaccines.  (examples of things to ask: : When did symptoms start? Fever? Cough? Productive? Color to sputum? More sputum than usual? Wheezing? Have you needed increased oxygen? Are you taking your respiratory medications? What over the counter measures have you tried?)  Allergies  Allergen Reactions  . Olmesartan Medoxomil     REACTION: headache    Immunization History  Administered Date(s) Administered  . Fluad Quad(high Dose 65+) 04/18/2019  . Influenza  Split 03/23/2011, 04/07/2013  . Influenza Whole 04/13/2008, 04/29/2009, 03/29/2010, 03/29/2018  . Influenza-Unspecified 04/08/2012, 03/28/2014, 03/14/2015, 03/13/2016  . PFIZER SARS-COV-2 Vaccination 08/13/2019, 09/05/2019  . PPD Test 03/23/2011  . Pneumococcal Conjugate-13 01/10/2014  . Pneumococcal Polysaccharide-23 07/07/2010  . Td 12/27/2001  . Tdap 08/06/2014

## 2020-04-04 ENCOUNTER — Encounter: Payer: Self-pay | Admitting: Cardiology

## 2020-04-04 NOTE — Telephone Encounter (Signed)
Error

## 2020-04-05 ENCOUNTER — Encounter: Payer: Self-pay | Admitting: Adult Health

## 2020-04-05 ENCOUNTER — Ambulatory Visit (INDEPENDENT_AMBULATORY_CARE_PROVIDER_SITE_OTHER): Payer: Medicare Other

## 2020-04-05 ENCOUNTER — Ambulatory Visit (INDEPENDENT_AMBULATORY_CARE_PROVIDER_SITE_OTHER): Payer: Medicare Other | Admitting: Primary Care

## 2020-04-05 ENCOUNTER — Other Ambulatory Visit: Payer: Self-pay

## 2020-04-05 ENCOUNTER — Telehealth: Payer: Self-pay | Admitting: Primary Care

## 2020-04-05 VITALS — BP 122/74 | HR 92 | Temp 97.5°F | Ht 64.5 in | Wt 153.2 lb

## 2020-04-05 DIAGNOSIS — J441 Chronic obstructive pulmonary disease with (acute) exacerbation: Secondary | ICD-10-CM

## 2020-04-05 DIAGNOSIS — J3089 Other allergic rhinitis: Secondary | ICD-10-CM

## 2020-04-05 DIAGNOSIS — R058 Other specified cough: Secondary | ICD-10-CM

## 2020-04-05 DIAGNOSIS — R0602 Shortness of breath: Secondary | ICD-10-CM | POA: Diagnosis not present

## 2020-04-05 DIAGNOSIS — J40 Bronchitis, not specified as acute or chronic: Secondary | ICD-10-CM | POA: Diagnosis not present

## 2020-04-05 DIAGNOSIS — R059 Cough, unspecified: Secondary | ICD-10-CM

## 2020-04-05 LAB — CBC WITH DIFFERENTIAL/PLATELET
Basophils Absolute: 0 10*3/uL (ref 0.0–0.1)
Basophils Relative: 0.3 % (ref 0.0–3.0)
Eosinophils Absolute: 0.3 10*3/uL (ref 0.0–0.7)
Eosinophils Relative: 4.1 % (ref 0.0–5.0)
HCT: 38.9 % (ref 36.0–46.0)
Hemoglobin: 12.4 g/dL (ref 12.0–15.0)
Lymphocytes Relative: 35.9 % (ref 12.0–46.0)
Lymphs Abs: 2.8 10*3/uL (ref 0.7–4.0)
MCHC: 31.9 g/dL (ref 30.0–36.0)
MCV: 86.4 fl (ref 78.0–100.0)
Monocytes Absolute: 0.7 10*3/uL (ref 0.1–1.0)
Monocytes Relative: 9.4 % (ref 3.0–12.0)
Neutro Abs: 4 10*3/uL (ref 1.4–7.7)
Neutrophils Relative %: 50.3 % (ref 43.0–77.0)
Platelets: 201 10*3/uL (ref 150.0–400.0)
RBC: 4.51 Mil/uL (ref 3.87–5.11)
RDW: 16.9 % — ABNORMAL HIGH (ref 11.5–15.5)
WBC: 7.9 10*3/uL (ref 4.0–10.5)

## 2020-04-05 LAB — BRAIN NATRIURETIC PEPTIDE: Pro B Natriuretic peptide (BNP): 88 pg/mL (ref 0.0–100.0)

## 2020-04-05 NOTE — Patient Instructions (Addendum)
  Recommendations: - Continue DELSYM  - Resume Singulair - Continue Azelastine nasal spray twice daily - Ok to continue Carbinoxamine   Orders: - CXR and labs today  Follow-up: - If not better needs 4 weeks follow-up with Dr. Melvyn Novas (all medications in hand)

## 2020-04-05 NOTE — Progress Notes (Signed)
@Patient  ID: Amy Escobar, female    DOB: March 09, 1946, 74 y.o.   MRN: [id number]  No chief complaint on file.   Referring provider: [name],*   Brief patient profile:  64 yobf quit smoking for good 1980s with h/o GERD going back to at least 2010 with neg egd by [facility] 09/06/12   And some tendency for colds to settle in sinuses and tendency for pnds req daily zyrtec x 2018 - was eval in pulmonary clinic with nl pfts/unexplained doe and cough attributed to uacs and started on gabapentin but did not return p 09/12/13 with  recs  As follows:  Increase neurontin to 300 mg  three times a day to see if helps your cough  HPI: 53 year olf female, former smoker. PMH upper airway cough, URI, seasonal allergic rhinitis, HTN, mild pulmonary hypertension. Patient of Dr. Melvyn Novas, last seen in 2020  04/05/2020 Patient presents today acute visit for cough. She called our office on 04/02/20 with reports cough cough with clear phlegm. She was advised by Dr. Melvyn Novas to take Delsym cough syrup over the counter. She has been taking medication for 2-3 days and does feel like this has been helping.  No hx COPD or asthma. She is not taking singulair. She is currently prescribed carbinoxamine 4mg  for allergies per Dr. Verlin Fester. PND symptoms are controlled with saline nasal spray. She has reflux but it is controled with nexium twice daily. CXR today showed diffuse interstitial thickening likely reflecting chronic bronchitis; no evidence of pneumonia or pleural effusion. Plan check PFTs with [facility], if diffusion capacity is abnormal would check HRCT.   Allergies  Allergen Reactions  . Olmesartan Medoxomil     REACTION: headache    Immunization History  Administered Date(s) Administered  . Fluad Quad(high Dose 65+) 04/18/2019  . Influenza Split 03/23/2011, 04/07/2013  . Influenza Whole 04/13/2008, 04/29/2009, 03/29/2010, 03/29/2018  . Influenza-Unspecified 04/08/2012, 03/28/2014, 03/14/2015, 03/13/2016  .  PFIZER SARS-COV-2 Vaccination 08/13/2019, 09/05/2019  . PPD Test 03/23/2011  . Pneumococcal Conjugate-13 01/10/2014  . Pneumococcal Polysaccharide-23 07/07/2010  . Td 12/27/2001  . Tdap 08/06/2014    Past Medical History:  Diagnosis Date  . Alkaline phosphatase deficiency    w/u Ne  . Allergic rhinitis   . Anxiety   . Arthritis   . DM2 (diabetes mellitus, type 2) ([place])   . GERD (gastroesophageal reflux disease)   . Gout   . Hemorrhoids   . Hyperlipidemia   . Hypertension   . Mild pulmonary hypertension ([place])   . NSVT (nonsustained ventricular tachycardia) ([place])   . Osteoporosis   . PVC's (premature ventricular contractions)   . Thyroid nodule    small  . Tubular adenoma of colon 2017  . Urticaria   . Uterine fibroid     Tobacco History: Social History   Tobacco Use  Smoking Status Former Smoker  . Packs/day: 0.25  . Years: 5.00  . Pack years: 1.25  . Types: Cigarettes  . Quit date: 06/29/1968  . Years since quitting: 51.8  Smokeless Tobacco Never Used   Counseling given: Not Answered   Outpatient Medications Prior to Visit  Medication Sig Dispense Refill  . aspirin EC 81 MG tablet Take 81 mg by mouth daily.    . Azelastine-Fluticasone 137-50 MCG/ACT SUSP Place 1 spray into the nose 2 (two) times daily. 23 g 5  . benzonatate (TESSALON) 200 MG capsule TAKE ONE CAPSULE BY MOUTH THREE TIMES A DAY AS NEEDED FOR COUGH 45 capsule 2  .  Carbinoxamine Maleate 4 MG TABS Take 1 tablet (4 mg total) by mouth every 8 (eight) hours as needed. 56 tablet 5  . carvedilol (COREG) 6.25 MG tablet Take 1 tablet (6.25 mg total) by mouth 2 (two) times daily. 180 tablet 2  . Chlorphen-Pseudoephed-APAP (CORICIDIN D PO) Take by mouth as needed.     . DULoxetine (CYMBALTA) 30 MG capsule Take 1 capsule (30 mg total) by mouth 2 (two) times daily. 60 capsule 3  . esomeprazole (NEXIUM) 40 MG capsule TAKE ONE CAPSULE BY MOUTH DAILY 30 TO 60 MINUTES BEFORE YOUR FIRST AND LAST MEALS OF THE DAY 180  capsule 3  . fesoterodine (TOVIAZ) 4 MG TB24 tablet Take 4 mg daily by mouth.    Marland Kitchen glucose blood (ONETOUCH VERIO) test strip Use to check blood sugar 2 times a day. 200 each 2  . hydrocortisone 2.5 % lotion     . metFORMIN (GLUCOPHAGE) 500 MG tablet Take 500 mg by mouth 2 (two) times daily with a meal.    . Olopatadine HCl 0.2 % SOLN Place 1 drop into both eyes daily. 2.5 mL 12  . Respiratory Therapy Supplies MISC Use flutter valve as needed to break the coughing cycle. 1 each 1  . rosuvastatin (CRESTOR) 5 MG tablet TAKE ONE TABLET BY MOUTH DAILY 90 tablet 1  . triamcinolone cream (KENALOG) 0.1 % APPLY 1 APPLICATION TOPICALLY 4 (FOUR) TIMES DAILY AS NEEDED FOR RASH 45 g 1  . UNKNOWN TO PATIENT Inject as directed every 6 (six) months. 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 valsartan (DIOVAN) 80 MG tablet TAKE ONE TABLET BY MOUTH DAILY 90 tablet 3  . Vitamin D, Ergocalciferol, (DRISDOL) 1.25 MG (50000 UNIT) CAPS capsule TAKE ONE CAPSULE BY MOUTH ONCE WEEKLY 12 capsule 0  . metFORMIN (GLUCOPHAGE-XR) 500 MG 24 hr tablet TAKE TWO TABLETS BY MOUTH TWICE A DAY 360 tablet 0   No facility-administered medications prior to visit.   Review of Systems  Review of Systems  Constitutional: Negative.   Respiratory: Positive for cough and shortness of breath.     Physical Exam  BP 122/74 (BP Location: Left Arm, Cuff Size: Normal)   Pulse 92   Temp (!) 97.5 F (36.4 C) (Oral)   Ht 5' 4.5" (1.638 m)   Wt 153 lb 3.2 oz (69.5 kg)   SpO2 97%   BMI 25.89 kg/m  Physical Exam Constitutional:      Appearance: Normal appearance.  HENT:     Head: Normocephalic and atraumatic.  Cardiovascular:     Rate and Rhythm: Normal rate and regular rhythm.  Pulmonary:     Effort: Pulmonary effort is normal.     Breath sounds: Rales present. No wheezing or rhonchi.  Neurological:     Mental Status: She is alert.  Psychiatric:        Mood and Affect: Mood normal.        Behavior: Behavior normal.         Thought Content: Thought content normal.        Judgment: Judgment normal.      Lab Results:  CBC    Component Value Date/Time   WBC 7.9 04/05/2020 1020   RBC 4.51 04/05/2020 1020   HGB 12.4 04/05/2020 1020   HGB 12.4 02/19/2020 0802   HCT 38.9 04/05/2020 1020   HCT 38.4 02/19/2020 0802   PLT 201.0 04/05/2020 1020   PLT 220 02/19/2020 0802   MCV 86.4 04/05/2020 1020  MCV 84 02/19/2020 0802   MCH 27.3 02/19/2020 0802   MCH 27.3 02/13/2020 1008   MCHC 31.9 04/05/2020 1020   RDW 16.9 (H) 04/05/2020 1020   RDW 13.4 02/19/2020 0802   LYMPHSABS 2.8 04/05/2020 1020   LYMPHSABS WILL FOLLOW 12/18/2016 0941   MONOABS 0.7 04/05/2020 1020   EOSABS 0.3 04/05/2020 1020   EOSABS WILL FOLLOW 12/18/2016 0941   BASOSABS 0.0 04/05/2020 1020   BASOSABS WILL FOLLOW 12/18/2016 0941    BMET    Component Value Date/Time   NA 142 02/19/2020 0802   K 3.9 02/19/2020 0802   CL 102 02/19/2020 0802   CO2 27 02/19/2020 0802   GLUCOSE 115 (H) 02/19/2020 0802   GLUCOSE 185 (H) 02/13/2020 1008   BUN 10 02/19/2020 0802   CREATININE 0.71 02/19/2020 0802   CREATININE 0.76 02/13/2020 1008   CALCIUM 9.1 02/19/2020 0802   GFRNONAA 84 02/19/2020 0802   GFRAA 97 02/19/2020 0802    BNP No results found for: BNP  ProBNP    Component Value Date/Time   PROBNP 88.0 04/05/2020 1020    Imaging: DG Chest 2 View  Result Date: 04/05/2020 CLINICAL DATA:  Bronchitis EXAM: CHEST - 2 VIEW COMPARISON:  May 08, 2019 FINDINGS: There is diffuse interstitial thickening bilaterally. There is no frank edema or airspace opacity. The heart is upper normal in size with pulmonary vascularity normal. No adenopathy. No bone lesions. IMPRESSION: Diffuse interstitial thickening is noted, likely reflecting chronic bronchitis. No frank edema or consolidation. Heart upper normal in size. No adenopathy. Electronically Signed   By: Lowella Grip III M.D.   On: 04/05/2020 16:17   DG INJECT DIAG/THERA/INC  NEEDLE/CATH/PLC EPI/LUMB/SAC W/IMG  Result Date: 03/11/2020 CLINICAL DATA:  Low back and left leg pain.  L4-5 stenosis. FLUOROSCOPY TIME:  0 minutes 17 seconds. 17.03 micro gray meter squared PROCEDURE: The procedure, risks, benefits, and alternatives were explained to the patient. Questions regarding the procedure were encouraged and answered. The patient understands and consents to the procedure. LUMBAR EPIDURAL INJECTION: An interlaminar approach was performed on the left at L5, just below the stenosis. The overlying skin was cleansed and anesthetized. A 20 gauge epidural needle was advanced using loss-of-resistance technique. DIAGNOSTIC EPIDURAL INJECTION: Injection of Isovue-M 200 shows a good epidural pattern with spread above and below the level of needle placement, primarily on the left. No vascular opacification is seen. THERAPEUTIC EPIDURAL INJECTION: One hundred twenty mg of Depo-Medrol mixed with 2.5 cc 1% lidocaine were instilled. The procedure was well-tolerated, and the patient was discharged thirty minutes following the injection in good condition. COMPLICATIONS: None IMPRESSION: Technically successful epidural injection on the left at L5, just below the maximal stenosis, # 1. Electronically Signed   By: Nelson Chimes M.D.   On: 03/11/2020 09:37     Assessment & Plan:   Cough Former smoker. No known hx asthma or COPD. Patient has been experiencing persistent cough for several week-months. On exam, patient had rales to lung bases. CXR today showed diffuse interstitial thickening likely reflecting chronic bronchitis; no evidence of pneumonia or pleural effusion. Allergy markers remain elevated. Instructed patient to resume Singulair 10mg  at bedtime. May consider adding ICS for cough if indicated after pulmonary function testing. Plan check PFTs with [facility], if diffusion capacity is abnormal would check HRCT.  Recommendations: - Continue DELSYM twice daily  - Resume Singulair 10mg  at bedtime  d/t allergic rhinitis - Continue Azelastine nasal spray twice daily - Ok to continue Carbinoxamine   Orders: -  CXR and labs today - PFTs in 3 months   Follow-up: - 3 months with Dr. Melvyn Novas (all medications in hand); or sooner if not better   Perennial and seasonal allergic rhinitis - 04/05/2020 Eos 300, IgE 2,307  - Currently prescribed carbinoxamine 4mg  for allergies  - Advised she resume Singulair 10mg  at bedtime - May need further allergy management for cough, following with Dr. Lynden Oxford, NP 04/09/2020

## 2020-04-05 NOTE — Telephone Encounter (Signed)
Can you please get patient set up with PFTs with [facility] in 3 months

## 2020-04-08 LAB — IGE: IgE (Immunoglobulin E), Serum: 2307 kU/L — ABNORMAL HIGH (ref <=114)

## 2020-04-08 NOTE — Progress Notes (Signed)
Please let patient know her allergy markers were significantly elevated, similar to one year ago. I had already recommended she resume Singulair. May need to discuss further options with Dr. Melvyn Novas at her visit in November.

## 2020-04-08 NOTE — Telephone Encounter (Signed)
Called and spoke with pt letting her know that Amy Escobar wants her to have PFT in 3 months. Pt verbalized understanding. PFT appt has been scheduled. Nothing further needed.

## 2020-04-09 ENCOUNTER — Other Ambulatory Visit: Payer: Self-pay

## 2020-04-09 ENCOUNTER — Encounter: Payer: Self-pay | Admitting: Family Medicine

## 2020-04-09 ENCOUNTER — Encounter: Payer: Self-pay | Admitting: Primary Care

## 2020-04-09 ENCOUNTER — Ambulatory Visit (INDEPENDENT_AMBULATORY_CARE_PROVIDER_SITE_OTHER): Payer: Medicare Other | Admitting: Family Medicine

## 2020-04-09 DIAGNOSIS — G8929 Other chronic pain: Secondary | ICD-10-CM | POA: Diagnosis not present

## 2020-04-09 DIAGNOSIS — M170 Bilateral primary osteoarthritis of knee: Secondary | ICD-10-CM

## 2020-04-09 DIAGNOSIS — M545 Low back pain, unspecified: Secondary | ICD-10-CM | POA: Diagnosis not present

## 2020-04-09 NOTE — Patient Instructions (Signed)
Injected both knees ?See me again in 10-12 weeks ?

## 2020-04-09 NOTE — Assessment & Plan Note (Addendum)
Former smoker. No known hx asthma or COPD. Patient has been experiencing persistent cough for several week-months. On exam, patient had rales to lung bases. CXR today showed diffuse interstitial thickening likely reflecting chronic bronchitis; no evidence of pneumonia or pleural effusion. Allergy markers remain elevated. Instructed patient to resume Singulair 10mg  at bedtime. May consider adding ICS for cough if indicated after pulmonary function testing. Plan check PFTs with [facility]CO, if diffusion capacity is abnormal would check HRCT.  Recommendations: - Continue DELSYM twice daily  - Resume Singulair 10mg  at bedtime d/t allergic rhinitis - Continue Azelastine nasal spray twice daily - Ok to continue Carbinoxamine   Orders: - CXR and labs today - PFTs in 3 months   Follow-up: - 3 months with Dr. Melvyn Novas (all medications in hand); or sooner if not better

## 2020-04-09 NOTE — Assessment & Plan Note (Signed)
Patient has responded very well to the epidural in the back.  Can repeat if necessary.  Follow-up with me again in 3 months

## 2020-04-09 NOTE — Progress Notes (Signed)
[name] Phone: [phone] Subjective:   Amy Escobar, am serving as a scribe for Dr. Hulan Saas. This visit occurred during the SARS-CoV-2 public health emergency.  Safety protocols were in place, including screening questions prior to the visit, additional usage of staff PPE, and extensive cleaning of exam room while observing appropriate contact time as indicated for disinfecting solutions.   I'm seeing this patient by the request  of:  [name], FNP  CC: Bilateral knee pain and low back follow-up  AJO:INOMVEHMCN   02/13/2020 Patient continues to have back pain with sweats appears to be more radicular symptoms.  Patient did have x-rays that did show an anterior listhesis of L3 on L4 that would be consistent with her bilateral hip pain.  Would not be as consistent with the radicular pain to the left knee.  I would like to get laboratory work-up to make sure there is Escobar any type of infectious etiology but I think highly unlikely with Escobar erythema or warmness to touch of the knee.  Patient though will have an MRI of the back and could be a candidate for potential epidurals.  Patient wants to hold on any other medications if possible but will take Tylenol regularly.  Patient will follow up after imaging and will discuss further treatment options.  Once again patient knows if any worsening pain to seek medical attention immediately.   Update 04/09/2020 Amy Escobar is a 74 y.o. female coming in with complaint of low back pain. Epidural on 03/11/2020. Epidural did help alleviate her pain.  Patient states that the back pain is approximately 85 to 95% better.  Very minimal pain, but significantly less radiation down the legs as well.  Has noticed significant strength and is able to walk more without her cane at this time.  Pain in left knee over medial aspect.  Bilateral knee arthritis.  Wants to avoid any  surgical intervention.  Feels like she is starting to worsen to the point that it is starting to affect daily activities again.  Has noticed some very mild increase in swelling.  Patient likely is Escobar longer taking care of other people and is being able to focus on herself a little bit more.       Past Medical History:  Diagnosis Date  . Alkaline phosphatase deficiency    w/u Ne  . Allergic rhinitis   . Anxiety   . Arthritis   . DM2 (diabetes mellitus, type 2) ([place])   . GERD (gastroesophageal reflux disease)   . Gout   . Hemorrhoids   . Hyperlipidemia   . Hypertension   . Mild pulmonary hypertension ([place])   . NSVT (nonsustained ventricular tachycardia) ([place])   . Osteoporosis   . PVC's (premature ventricular contractions)   . Thyroid nodule    small  . Tubular adenoma of colon 2017  . Urticaria   . Uterine fibroid    Past Surgical History:  Procedure Laterality Date  . CATARACT EXTRACTION Bilateral 2016  . COLONOSCOPY  2007  . echocardiogram (other)  01/16/2002  . 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 stress cardiolite  02/12/2006  . TOENAIL EXCISION    . TUBAL LIGATION    . TYMPANOSTOMY TUBE PLACEMENT     Social History   Socioeconomic History  . Marital status: Widowed    Spouse name: Not on file  . Number of children: 2  .  Years of education: Not on file  . Highest education level: Not on file  Occupational History  . Occupation: Surveyor, minerals: UNEMPLOYED  Tobacco Use  . Smoking status: Former Smoker    Packs/day: 0.25    Years: 5.00    Pack years: 1.25    Types: Cigarettes    Quit date: 06/29/1968    Years since quitting: 51.8  . Smokeless tobacco: Never Used  Vaping Use  . Vaping Use: Never used  Substance and Sexual Activity  . Alcohol use: Escobar    Alcohol/week: 0.0 standard drinks  . Drug use: Escobar  . Sexual activity: Not on file  Other Topics Concern  . Not on file  Social History Narrative   Widowed 2010.    Social Determinants of  Health   Financial Resource Strain:   . Difficulty of Paying Living Expenses: Not on file  Food Insecurity:   . Worried About Charity fundraiser in the Last Year: Not on file  . Ran Out of Food in the Last Year: Not on file  Transportation Needs:   . Lack of Transportation (Medical): Not on file  . Lack of Transportation (Non-Medical): Not on file  Physical Activity:   . Days of Exercise per Week: Not on file  . Minutes of Exercise per Session: Not on file  Stress:   . Feeling of Stress : Not on file  Social Connections:   . Frequency of Communication with Friends and Family: Not on file  . Frequency of Social Gatherings with Friends and Family: Not on file  . Attends Religious Services: Not on file  . Active Member of Clubs or Organizations: Not on file  . Attends Archivist Meetings: Not on file  . Marital Status: Not on file   Allergies  Allergen Reactions  . Olmesartan Medoxomil     REACTION: headache   Family History  Problem Relation Age of Onset  . Heart disease Mother   . Allergic rhinitis Mother   . Heart disease Father   . Heart disease Maternal Grandfather   . Rectal cancer Maternal Grandfather   . Stomach cancer Maternal Grandmother   . Colon cancer Neg Hx   . Breast cancer Neg Hx     Current Outpatient Medications (Endocrine & Metabolic):  .  metFORMIN (GLUCOPHAGE) 500 MG tablet, Take 500 mg by mouth 2 (two) times daily with a meal.  Current Outpatient Medications (Cardiovascular):  .  carvedilol (COREG) 6.25 MG tablet, Take 1 tablet (6.25 mg total) by mouth 2 (two) times daily. .  rosuvastatin (CRESTOR) 5 MG tablet, TAKE ONE TABLET BY MOUTH DAILY .  valsartan (DIOVAN) 80 MG tablet, TAKE ONE TABLET BY MOUTH DAILY  Current Outpatient Medications (Respiratory):  Marland Kitchen  Azelastine-Fluticasone 137-50 MCG/ACT SUSP, Place 1 spray into the nose 2 (two) times daily. .  benzonatate (TESSALON) 200 MG capsule, TAKE ONE CAPSULE BY MOUTH THREE TIMES A DAY AS  NEEDED FOR COUGH .  Carbinoxamine Maleate 4 MG TABS, Take 1 tablet (4 mg total) by mouth every 8 (eight) hours as needed. .  Chlorphen-Pseudoephed-APAP (CORICIDIN D PO), Take by mouth as needed.   Current Outpatient Medications (Analgesics):  .  aspirin EC 81 MG tablet, Take 81 mg by mouth daily.   Current Outpatient Medications (Other):  Marland Kitchen  DULoxetine (CYMBALTA) 30 MG capsule, Take 1 capsule (30 mg total) by mouth 2 (two) times daily. Marland Kitchen  esomeprazole (NEXIUM) 40 MG capsule, TAKE ONE CAPSULE  BY MOUTH DAILY 30 TO 60 MINUTES BEFORE YOUR FIRST AND LAST MEALS OF THE DAY .  fesoterodine (TOVIAZ) 4 MG TB24 tablet, Take 4 mg daily by mouth. Marland Kitchen  glucose blood (ONETOUCH VERIO) test strip, Use to check blood sugar 2 times a day. .  hydrocortisone 2.5 % lotion,  .  Olopatadine HCl 0.2 % SOLN, Place 1 drop into both eyes daily. Marland Kitchen  Respiratory Therapy Supplies MISC, Use flutter valve as needed to break the coughing cycle. .  triamcinolone cream (KENALOG) 0.1 %, APPLY 1 APPLICATION TOPICALLY 4 (FOUR) TIMES DAILY AS NEEDED FOR RASH .  UNKNOWN TO PATIENT, Inject as directed every 6 (six) months. 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  Vitamin D, Ergocalciferol, (DRISDOL) 1.25 MG (50000 UNIT) CAPS capsule, TAKE ONE CAPSULE BY MOUTH ONCE WEEKLY   Reviewed prior external information including notes and imaging from  primary care provider As well as notes that were available from care everywhere and other healthcare systems.  Past medical history, social, surgical and family history all reviewed in electronic medical record.  Escobar pertanent information unless stated regarding to the chief complaint.   Review of Systems:  Escobar headache, visual changes, nausea, vomiting, diarrhea, constipation, dizziness, abdominal pain, skin rash, fevers, chills, night sweats, weight loss, swollen lymph nodes, body aches, joint swelling, chest pain, shortness of breath, mood changes. POSITIVE muscle aches  Objective    Blood pressure 122/90, pulse (!) 104, height 5' 4.5" (1.638 m), weight 153 lb (69.4 kg), SpO2 96 %.   General: Escobar apparent distress alert and oriented x3 mood and affect normal, dressed appropriately.  HEENT: Pupils equal, extraocular movements intact  Respiratory: Patient's speak in full sentences and does not appear short of breath  Gait antalgic walking with the aid of a cane Knee: Bilateral valgus deformity noted.  Abnormal thigh to calf ratio.  Tender to palpation over medial and PF joint line.  ROM full in flexion and extension and lower leg rotation. instability with valgus force.  painful patellar compression. Patellar glide with moderate crepitus. Patellar and quadriceps tendons unremarkable. Hamstring and quadriceps strength is normal.   Low back exam still has some mild tenderness to palpation of the paraspinal musculature but negative straight leg test.  Some mild pain with increasing extension.  After informed written and verbal consent, patient was seated on exam table. Right knee was prepped with alcohol swab and utilizing anterolateral approach, patient's right knee space was injected with 4:1  marcaine 0.5%: Kenalog 40mg /dL. Patient tolerated the procedure well without immediate complications.  After informed written and verbal consent, patient was seated on exam table. Left knee was prepped with alcohol swab and utilizing anterolateral approach, patient's left knee space was injected with 4:1  marcaine 0.5%: Kenalog 40mg /dL. Patient tolerated the procedure well without immediate complications.   Impression and Recommendations:     The above documentation has been reviewed and is accurate and complete Lyndal Pulley, DO

## 2020-04-09 NOTE — Assessment & Plan Note (Signed)
Bilateral injections given today, tolerated the procedure well, discussed icing regimen and home exercise, which activities to doing which wants to avoid.  Increase activity slowly.  Follow-up again in 12 weeks.  Chronic problem with exacerbation patient wants to avoid any surgical problems.

## 2020-04-09 NOTE — Assessment & Plan Note (Signed)
-   04/05/2020 Eos 300, IgE 2,307  - Currently prescribed carbinoxamine 4mg  for allergies  - Advised she resume Singulair 10mg  at bedtime - May need further allergy management for cough, following with Dr. Starling Manns

## 2020-04-13 ENCOUNTER — Other Ambulatory Visit: Payer: Self-pay | Admitting: Endocrinology

## 2020-04-13 NOTE — Telephone Encounter (Signed)
1.  Please schedule f/u appt 2.  Then please refill x 2 mos, pending that appt.  

## 2020-04-19 ENCOUNTER — Telehealth: Payer: Self-pay | Admitting: Family

## 2020-04-19 NOTE — Telephone Encounter (Signed)
DULoxetine (CYMBALTA) 30 MG capsule Patient states that her prescription is supposed to be two capsules by mouth two times daily but it only says 1 capsule by mouth 2 times daily. [facility] [address], [place] Phone:  [phone]  Fax:  [phone]

## 2020-04-22 NOTE — Telephone Encounter (Signed)
Per Mickel Baas last note it states (1) 30 mg capsule twice a day. Will hold for Mickel Baas response whn she return.Marland KitchenJohny Chess

## 2020-04-24 MED ORDER — DULOXETINE HCL 30 MG PO CPEP: 30.0000 mg | ORAL_CAPSULE | Freq: Two times a day (BID) | ORAL | 3 refills | Status: DC

## 2020-04-24 NOTE — Telephone Encounter (Signed)
Her last phone call in August indicated that she wanted to stay on Cymbalta 30 mg bid; that's what we have been doing for her. I will make sure she has refills.

## 2020-04-26 ENCOUNTER — Other Ambulatory Visit: Payer: Self-pay | Admitting: Cardiology

## 2020-04-26 ENCOUNTER — Other Ambulatory Visit: Payer: Self-pay | Admitting: Family

## 2020-04-26 DIAGNOSIS — R072 Precordial pain: Secondary | ICD-10-CM

## 2020-04-26 DIAGNOSIS — I10 Essential (primary) hypertension: Secondary | ICD-10-CM

## 2020-04-26 DIAGNOSIS — I2510 Atherosclerotic heart disease of native coronary artery without angina pectoris: Secondary | ICD-10-CM

## 2020-04-26 DIAGNOSIS — E782 Mixed hyperlipidemia: Secondary | ICD-10-CM

## 2020-05-02 NOTE — Telephone Encounter (Signed)
Pt able to confirm that she is taking the Cymbalta as prescribed & that "it's doing what it's supposed to do".  Pt denies ques/concerns at this time.

## 2020-05-06 ENCOUNTER — Ambulatory Visit (INDEPENDENT_AMBULATORY_CARE_PROVIDER_SITE_OTHER): Payer: Medicare Other

## 2020-05-06 ENCOUNTER — Telehealth: Payer: Self-pay | Admitting: *Deleted

## 2020-05-06 ENCOUNTER — Encounter: Payer: Self-pay | Admitting: Internal Medicine

## 2020-05-06 ENCOUNTER — Other Ambulatory Visit: Payer: Self-pay

## 2020-05-06 ENCOUNTER — Ambulatory Visit (INDEPENDENT_AMBULATORY_CARE_PROVIDER_SITE_OTHER): Payer: Medicare Other | Admitting: Internal Medicine

## 2020-05-06 DIAGNOSIS — R059 Cough, unspecified: Secondary | ICD-10-CM | POA: Diagnosis not present

## 2020-05-06 DIAGNOSIS — R058 Other specified cough: Secondary | ICD-10-CM

## 2020-05-06 DIAGNOSIS — R0609 Other forms of dyspnea: Secondary | ICD-10-CM

## 2020-05-06 DIAGNOSIS — J90 Pleural effusion, not elsewhere classified: Secondary | ICD-10-CM | POA: Diagnosis not present

## 2020-05-06 DIAGNOSIS — R0600 Dyspnea, unspecified: Secondary | ICD-10-CM

## 2020-05-06 MED ORDER — PREDNISONE 10 MG PO TABS: ORAL_TABLET | ORAL | 0 refills | Status: DC

## 2020-05-06 NOTE — Patient Instructions (Addendum)
You have really bad allergies which are likely contributing to your night time cough   Prednisone 10 mg take  4 each am x 2 days,   2 each am x 2 days,  1 each am x 2 days and stop   Add pepcid 20 mg (otc)  take on hour before bed along with Chorpheramine 4 mg x 2  Instead of the late dose of carbinoxime   Please remember to go to the  x-ray department  for your tests - we will call you with the results when they are available     Make an appt to see Dr Bobbit's practice next available and take all medications with you   If cough not better work you way back up to 300 mg 4 x day (or whatever dose you can tolerate the cough is better but do not exceed 1200 mg per day total)     I will see you back the week  after your PFTs   ADD:  PREDNISONE should have read Take 4 for three days 3 for three days 2 for three days 1 for three days and stop

## 2020-05-06 NOTE — Assessment & Plan Note (Signed)
Onset was around 2013  -  Neg egd by [facility] Plan 09/06/12  - Allergy profile 08/22/2018 >  Eos 0.2 /  IgE  2095 RAST pos for everything x cats  - 08/22/2018  gabapentin changed from 600 at hs to 300 mg each am and 600 at bedtime  - 04/19/2019 cough flared off gabapentin > resume plus add singulair trial   - Allergy eval 06/26/2019 [name] skin testing revealed reactivity to grass pollen, ragweed pollen, weed pollen, tree pollen, molds, cockroach antigen, and dust mite antigen > rec add dymista  - 05/06/2020 cough worse off gabapentin > try max gerd rx and short course pred then if not better titrate up gabapentin to max of 300mg  qid plus pred  12 days and f/u allergy   Per Dr Brantley Fling records, when she was on gabapentin (which she stopped on her own prior to onset of cough flare)  and max rx for rhinitis (which she says she's taking regularly) she had no significant cough so rec rx as above and plus h1 and 1st gen H1 blockers per guidelines  and  Also added 6 days of Prednisone in case of component of Th-2 driven upper or lower airways inflammation (if cough responds short term only to relapse befor return while will on rx for uacs that would point to allergic rhinitis/ asthma or eos bronchitis)     > f/u allergy

## 2020-05-06 NOTE — Telephone Encounter (Signed)
Tried calling the pt and no answer- LMTCB

## 2020-05-06 NOTE — Progress Notes (Signed)
Amy Escobar, female    DOB: 09-01-1945    MRN: [id number]   Brief patient profile:  67 yobf quit smoking for good 1980s with h/o GERD going back to at least 2010 with neg egd by [facility] 09/06/12   And some tendency for colds to settle in sinuses and tendency for pnds req daily zyrtec x 2018 - was eval in pulmonary clinic with nl pfts/unexplained doe and cough attributed to uacs and started on gabapentin but did not return p 09/12/13 with  recs  As follows:  Increase neurontin to 300 mg  three times a day to see if helps your cough  If still have drainage a recommend a trial of zyrtec 10 mg daily and possible an allergy evaluation   Improved p this eval and req gabapentin up to 600 tid for dm neuropathy but adjusted this down on her own due to excess dayime drowsiness at only taking 600 mg at when self referred back to pulmonary clinic 08/22/2018 due to cough since mid Jan 2020 s/p 2 zpaks     History of Present Illness  08/22/2018  Pulmonary/ 1st office eval/Amy Escobar  Chief Complaint  Patient presents with  . Pulmonary Consult    Self referral. Pt c/o cough x 5 wks- currently prod with clear sputum.   Dyspnea:  Does flat and slow = MMRC2 = can't walk a nl pace on a flat grade s sob but does fine slow and flat also limited by knees  Cough: urge to clear throat x 2013 / pred did not help / daytime aggravated by voice use  Sleep: on one pillow / bed is flat sometimes wakes her up / rec Increase you nexium to 40 mg Take 30- 60 min before your first and last meals of the day  Increase your gabapentin to 300 mg each am and continue 600 mg at bedtime  For cough as needed >  Tessalon 200 mg up to every 6 hours and the goal is no coughing at all  GERD  Diet       11/08/2018  f/u ov/Amy Escobar re: uacs improved vs prior  Chief Complaint  Patient presents with  . Follow-up    Cough is about the same. No new co's today.   Dyspnea:  MMRC1 = can walk nl pace, flat grade, can't hurry or go uphills or  steps s sob  Cough: improved but needing tessilon 200 up to twice daily  Sleeping: on wedge wake up with dry cough about  twice weekly  SABA use: none  02: none  rec For drainage / throat tickle try take CHLORPHENIRAMINE  4 mg  (Chlortab 4mg   at [employer] should be easiest to find in the green box)  take one every 4 hours as needed - available over the counter- may cause drowsiness so start with just a bedtime dose or two and see how you tolerate it before trying in daytime Ok to take  zyrtec otc daily  but you may find you don't need it anymore    - try and see  Please schedule a follow up visit in 6 months but call sooner if needed  - consider adding singulair if hasn't tried it then allergy eval next step        04/19/19 televist For drainage / throat tickle try take CHLORPHENIRAMINE  4 mg  (Chlortab 4mg   at [employer] should be easiest to find in the green box)  take one every 4 hours  as needed - available over the counter and take 2 tablets about one hour before bed  Renewed Tessalon to take as needed for  Restart gabapentin  300 mg in am  600 mg after supper  Start singulair 10 mg each pm   Keep your previous appt to see me but  Please bring  all medications /inhalers/ solutions in hand so we can verify exactly what you are taking. This includes all medications from all doctors and over the [place] separate them into two bags:  the ones you take automatically, no matter what, vs the ones you take just when you feel you need them "BAG #2 is UP TO YOU"  - this will really help [place] help you take your medications more effectively.    05/08/2019  f/u ov/Amy Escobar re: cough x 2013 - brought most of her meds no h1/ no gabapentin Chief Complaint  Patient presents with  . Follow-up    Cough-"sometimes I feel like it's worse"- occ prod with white or clear sputum.   Dyspnea: housework  Cough: sporadic/ tessalon helps when takes it  Sleeping: wakes up sev times a week  coughing  SABA use: none  02: none  rec Chlorpheniramine should be to take 2 bedtime  Continue singulair 10 mg each pm  During the day can take tessalon 200 mg every 6 hours if needed  Follow up here is as needed once you establish with Dr Neldon Mc > Allergy eval 06/26/2019 Amy Escobar Aeroallergens skin testing revealed reactivity to grass pollen, ragweed pollen, weed pollen, tree pollen, molds, cockroach antigen, and dust mite antigen.  10/23/2019 reported to Amy Escobar that cough resolved   04/05/20 acute visit with NP off singulair worse rec - Continue DELSYM  - Resume Singulair - Continue Azelastine nasal spray twice daily - Ok to continue Carbinoxamine  CXR  showed diffuse interstitial thickening likely reflecting chronic bronchitis; no evidence of pneumonia or pleural effusion. Plan check PFTs with [facility], if diffusion capacity is abnormal would check HRCT.    05/06/2020  f/u ov/Amy Escobar re:  Cough since 2013  Now ? ILD / did not bring meds/ stopped gapapentin  Chief Complaint  Patient presents with  . Follow-up    Breathing has improved some since the last visit. She has cough with clear sputum- worse at night.   Dyspnea:  More limited by knee than breathing walks with cane  Cough: waking up most nocts 1-3 am  Sleeping: sleep on wedge pillow bed is flat  SABA use: none  02: none    No obvious day to day or daytime variability or assoc excess/ purulent sputum or mucus plugs or hemoptysis or cp or chest tightness, subjective wheeze or overt sinus or hb symptoms.     Amy Escobar denies any obvious fluctuation of symptoms with weather or environmental changes or other aggravating or alleviating factors except as outlined above   No unusual exposure hx or h/o childhood pna/ asthma or knowledge of premature birth.  Current Allergies, Complete Past Medical History, Past Surgical History, Family History, and Social History were reviewed in [employer] record.  ROS  The  following are not active complaints unless bolded Hoarseness, sore throat, dysphagia, dental problems, itching, sneezing,  nasal congestion or discharge of excess mucus or purulent secretions, ear ache,   fever, chills, sweats, unintended wt loss or wt gain, classically pleuritic or exertional cp,  orthopnea pnd or arm/hand swelling  or leg swelling, presyncope, palpitations, abdominal pain, anorexia, nausea, vomiting,  diarrhea  or change in bowel habits or change in bladder habits, change in stools or change in urine, dysuria, hematuria,  rash, arthralgias, visual complaints, headache, numbness, weakness or ataxia or problems with walking or coordination,  change in mood or  memory.        Current Meds  Medication Sig  . aspirin EC 81 MG tablet Take 81 mg by mouth daily.  . Azelastine-Fluticasone 137-50 MCG/ACT SUSP Place 1 spray into the nose 2 (two) times daily.  . benzonatate (TESSALON) 200 MG capsule TAKE ONE CAPSULE BY MOUTH THREE TIMES A DAY AS NEEDED FOR COUGH  . Carbinoxamine Maleate 4 MG TABS Take 1 tablet (4 mg total) by mouth every 8 (eight) hours as needed.  . carvedilol (COREG) 6.25 MG tablet Take 1 tablet (6.25 mg total) by mouth 2 (two) times daily.  . Chlorphen-Pseudoephed-APAP (CORICIDIN D PO) Take by mouth as needed.   . DULoxetine (CYMBALTA) 30 MG capsule Take 1 capsule (30 mg total) by mouth 2 (two) times daily.  Marland Kitchen esomeprazole (NEXIUM) 40 MG capsule TAKE ONE CAPSULE BY MOUTH DAILY 30 TO 60 MINUTES BEFORE YOUR FIRST AND LAST MEALS OF THE DAY  . fesoterodine (TOVIAZ) 4 MG TB24 tablet Take 4 mg daily by mouth.  Marland Kitchen glucose blood (ONETOUCH VERIO) test strip Use to check blood sugar 2 times a day.  . hydrocortisone 2.5 % lotion   . metFORMIN (GLUCOPHAGE) 500 MG tablet Take 500 mg by mouth 2 (two) times daily with a meal.  . metFORMIN (GLUCOPHAGE-XR) 500 MG 24 hr tablet TAKE TWO TABLETS BY MOUTH TWICE A DAY  . Olopatadine HCl 0.2 % SOLN Place 1 drop into both eyes daily.  Marland Kitchen  Respiratory Therapy Supplies MISC Use flutter valve as needed to break the coughing cycle.  . rosuvastatin (CRESTOR) 5 MG tablet TAKE ONE TABLET BY MOUTH DAILY  . triamcinolone cream (KENALOG) 0.1 % APPLY 1 APPLICATION TOPICALLY 4 (FOUR) TIMES DAILY AS NEEDED FOR RASH  . UNKNOWN TO PATIENT Inject as directed every 6 (six) months. 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 valsartan (DIOVAN) 80 MG tablet TAKE ONE TABLET BY MOUTH DAILY  . Vitamin D, Ergocalciferol, (DRISDOL) 1.25 MG (50000 UNIT) CAPS capsule TAKE ONE CAPSULE BY MOUTH ONCE WEEKLY             Objective:    05/06/2020        156  05/08/2019        154   11/08/18 173 lb (78.5 kg)  10/26/18 167 lb (75.8 kg)  09/14/18 162 lb (73.5 kg)    Vital signs reviewed  05/06/2020  - Note at rest 02 sats  98% on RA     amb bf with throat clearing and dry sounding cough with voice use    HEENT : pt wearing mask not removed for exam due to covid -19 concerns.    NECK :  without JVD/Nodes/TM/ nl carotid upstrokes bilaterally   LUNGS: no acc muscle use,  Nl contour chest which is clear to A and P bilaterally without cough on insp or exp maneuvers   CV:  RRR  no s3 or murmur or increase in P2, and no edema   ABD:  soft and nontender with nl inspiratory excursion in the supine position. No bruits or organomegaly appreciated, bowel sounds nl  MS:  Nl gait/ ext warm without deformities, calf tenderness, cyanosis or clubbing No obvious joint restrictions   SKIN: warm and dry without  lesions    NEURO:  alert, approp, nl sensorium with  no motor or cerebellar deficits apparent.   CXR PA and Lateral:   05/06/2020 :    I personally reviewed images and   impression as follows:   Improved aeration vs priors, no def ild         Assessment

## 2020-05-06 NOTE — Telephone Encounter (Signed)
-----   Message from Tanda Rockers, MD sent at 05/06/2020  1:03 PM EST ----- PREDNISONE should have read Take 4 for three days 3 for three days 2 for three days 1 for three days and stop

## 2020-05-07 NOTE — Telephone Encounter (Signed)
Spoke with the pt and notified of recs for pred taper  She verbalized understanding and nothing further needed

## 2020-05-09 NOTE — Progress Notes (Signed)
Spoke with pt and notified of results per Dr. Wert. Pt verbalized understanding and denied any questions. 

## 2020-05-13 ENCOUNTER — Other Ambulatory Visit: Payer: Self-pay

## 2020-05-13 ENCOUNTER — Ambulatory Visit (INDEPENDENT_AMBULATORY_CARE_PROVIDER_SITE_OTHER): Payer: Medicare Other | Admitting: Endocrinology

## 2020-05-13 ENCOUNTER — Encounter: Payer: Self-pay | Admitting: Endocrinology

## 2020-05-13 VITALS — BP 132/82 | HR 106 | Ht 65.0 in | Wt 158.0 lb

## 2020-05-13 DIAGNOSIS — E118 Type 2 diabetes mellitus with unspecified complications: Secondary | ICD-10-CM | POA: Diagnosis not present

## 2020-05-13 LAB — POCT GLYCOSYLATED HEMOGLOBIN (HGB A1C): Hemoglobin A1C: 5.6 % (ref 4.0–5.6)

## 2020-05-13 NOTE — Progress Notes (Signed)
Subjective:    Patient ID: Amy Escobar, female    DOB: [birth year], 74 y.o.   MRN: [id number]  HPI Pt returns for f/u of diabetes mellitus: DM type: 2 Dx'ed: 1287 Complications: CAD and DR. Therapy: metformin.   GDM: never.   DKA: never.   Severe hypoglycemia: never.   Pancreatitis: never.  Other: she has never been on insulin; she gets occasional steroid injections into the knees; she did not tolerate Invokana (vaginitis).   Interval history: pt states she takes metformin as rx'ed.  last steroid injections (knees) were months ago; she had prednisone for AB last week; she says cbg's are well-controlled.   Past Medical History:  Diagnosis Date  . Alkaline phosphatase deficiency    w/u Ne  . Allergic rhinitis   . Anxiety   . Arthritis   . DM2 (diabetes mellitus, type 2) ([place])   . GERD (gastroesophageal reflux disease)   . Gout   . Hemorrhoids   . Hyperlipidemia   . Hypertension   . Mild pulmonary hypertension ([place])   . NSVT (nonsustained ventricular tachycardia) ([place])   . Osteoporosis   . PVC's (premature ventricular contractions)   . Thyroid nodule    small  . Tubular adenoma of colon 2017  . Urticaria   . Uterine fibroid     Past Surgical History:  Procedure Laterality Date  . CATARACT EXTRACTION Bilateral 2016  . COLONOSCOPY  2007  . echocardiogram (other)  01/16/2002  . 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 stress cardiolite  02/12/2006  . TOENAIL EXCISION    . TUBAL LIGATION    . TYMPANOSTOMY TUBE PLACEMENT      Social History   Socioeconomic History  . Marital status: Widowed    Spouse name: Not on file  . Number of children: 2  . Years of education: Not on file  . Highest education level: Not on file  Occupational History  . Occupation: Surveyor, minerals: UNEMPLOYED  Tobacco Use  . Smoking status: Former Smoker    Packs/day: 0.25    Years: 5.00    Pack years: 1.25    Types: Cigarettes    Quit date: 06/29/1968    Years since  quitting: 51.9  . Smokeless tobacco: Never Used  Vaping Use  . Vaping Use: Never used  Substance and Sexual Activity  . Alcohol use: No    Alcohol/week: 0.0 standard drinks  . Drug use: No  . Sexual activity: Not on file  Other Topics Concern  . Not on file  Social History Narrative   Widowed 2010.    Social Determinants of Health   Financial Resource Strain:   . Difficulty of Paying Living Expenses: Not on file  Food Insecurity:   . Worried About Charity fundraiser in the Last Year: Not on file  . Ran Out of Food in the Last Year: Not on file  Transportation Needs:   . Lack of Transportation (Medical): Not on file  . Lack of Transportation (Non-Medical): Not on file  Physical Activity:   . Days of Exercise per Week: Not on file  . Minutes of Exercise per Session: Not on file  Stress:   . Feeling of Stress : Not on file  Social Connections:   . Frequency of Communication with Friends and Family: Not on file  . Frequency of Social Gatherings with Friends and Family: Not on file  . Attends Religious Services: Not on file  .  Active Member of Clubs or Organizations: Not on file  . Attends Archivist Meetings: Not on file  . Marital Status: Not on file  Intimate Partner Violence:   . Fear of Current or Ex-Partner: Not on file  . Emotionally Abused: Not on file  . Physically Abused: Not on file  . Sexually Abused: Not on file    Current Outpatient Medications on File Prior to Visit  Medication Sig Dispense Refill  . aspirin EC 81 MG tablet Take 81 mg by mouth daily.    . Azelastine-Fluticasone 137-50 MCG/ACT SUSP Place 1 spray into the nose 2 (two) times daily. 23 g 5  . benzonatate (TESSALON) 200 MG capsule TAKE ONE CAPSULE BY MOUTH THREE TIMES A DAY AS NEEDED FOR COUGH 45 capsule 2  . Carbinoxamine Maleate 4 MG TABS Take 1 tablet (4 mg total) by mouth every 8 (eight) hours as needed. 56 tablet 5  . carvedilol (COREG) 6.25 MG tablet Take 1 tablet (6.25 mg total)  by mouth 2 (two) times daily. 180 tablet 2  . Chlorphen-Pseudoephed-APAP (CORICIDIN D PO) Take by mouth as needed.     . DULoxetine (CYMBALTA) 30 MG capsule Take 1 capsule (30 mg total) by mouth 2 (two) times daily. 60 capsule 3  . esomeprazole (NEXIUM) 40 MG capsule TAKE ONE CAPSULE BY MOUTH DAILY 30 TO 60 MINUTES BEFORE YOUR FIRST AND LAST MEALS OF THE DAY 180 capsule 3  . fesoterodine (TOVIAZ) 4 MG TB24 tablet Take 4 mg daily by mouth.    Marland Kitchen glucose blood (ONETOUCH VERIO) test strip Use to check blood sugar 2 times a day. 200 each 2  . hydrocortisone 2.5 % lotion     . metFORMIN (GLUCOPHAGE) 500 MG tablet Take 500 mg by mouth 2 (two) times daily with a meal.    . metFORMIN (GLUCOPHAGE-XR) 500 MG 24 hr tablet TAKE TWO TABLETS BY MOUTH TWICE A DAY 360 tablet 0  . Olopatadine HCl 0.2 % SOLN Place 1 drop into both eyes daily. 2.5 mL 12  . predniSONE (DELTASONE) 10 MG tablet Take  4 each am x 2 days,   2 each am x 2 days,  1 each am x 2 days and stop 14 tablet 0  . Respiratory Therapy Supplies MISC Use flutter valve as needed to break the coughing cycle. 1 each 1  . rosuvastatin (CRESTOR) 5 MG tablet TAKE ONE TABLET BY MOUTH DAILY 90 tablet 1  . triamcinolone cream (KENALOG) 0.1 % APPLY 1 APPLICATION TOPICALLY 4 (FOUR) TIMES DAILY AS NEEDED FOR RASH 45 g 1  . UNKNOWN TO PATIENT Inject as directed every 6 (six) months. 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 valsartan (DIOVAN) 80 MG tablet TAKE ONE TABLET BY MOUTH DAILY 90 tablet 2  . Vitamin D, Ergocalciferol, (DRISDOL) 1.25 MG (50000 UNIT) CAPS capsule TAKE ONE CAPSULE BY MOUTH ONCE WEEKLY 12 capsule 0   No current facility-administered medications on file prior to visit.    Allergies  Allergen Reactions  . Olmesartan Medoxomil     REACTION: headache    Family History  Problem Relation Age of Onset  . Heart disease Mother   . Allergic rhinitis Mother   . Heart disease Father   . Heart disease Maternal Grandfather   .  Rectal cancer Maternal Grandfather   . Stomach cancer Maternal Grandmother   . Colon cancer Neg Hx   . Breast cancer Neg Hx     BP 132/82  Pulse (!) 106   Ht 5\' 5"  (1.651 m)   Wt 158 lb (71.7 kg)   SpO2 98%   BMI 26.29 kg/m    Review of Systems Denies n/v/d.      Objective:   Physical Exam VITAL SIGNS:  See vs page GENERAL: no distress Pulses: dorsalis pedis intact bilat.   MSK: no deformity of the feet CV: no leg edema.   Skin:  no ulcer on the feet.  normal color and temp on the feet.   Neuro: sensation is intact to touch on the feet.     Lab Results  Component Value Date   HGBA1C 5.6 05/13/2020   Lab Results  Component Value Date   TSH 1.860 02/19/2020   Lab Results  Component Value Date   CREATININE 0.71 02/19/2020   BUN 10 02/19/2020   NA 142 02/19/2020   K 3.9 02/19/2020   CL 102 02/19/2020   CO2 27 02/19/2020      Assessment & Plan:  Type 2 DM: well-controlled  Patient Instructions  Please continue the same metformin.   Please come back for a follow-up appointment in 6 months.

## 2020-05-13 NOTE — Patient Instructions (Addendum)
Please continue the same metformin Please come back for a follow-up appointment in 6 months.  

## 2020-05-16 DIAGNOSIS — N130 Hydronephrosis with ureteropelvic junction obstruction: Secondary | ICD-10-CM | POA: Diagnosis not present

## 2020-05-27 ENCOUNTER — Other Ambulatory Visit: Payer: Self-pay | Admitting: Internal Medicine

## 2020-05-27 DIAGNOSIS — R058 Other specified cough: Secondary | ICD-10-CM

## 2020-05-31 ENCOUNTER — Ambulatory Visit (INDEPENDENT_AMBULATORY_CARE_PROVIDER_SITE_OTHER): Payer: Medicare Other | Admitting: Family

## 2020-05-31 ENCOUNTER — Other Ambulatory Visit: Payer: Self-pay

## 2020-05-31 ENCOUNTER — Encounter: Payer: Self-pay | Admitting: Family

## 2020-05-31 VITALS — BP 128/74 | HR 94 | Temp 98.6°F | Ht 65.0 in | Wt 157.8 lb

## 2020-05-31 DIAGNOSIS — R399 Unspecified symptoms and signs involving the genitourinary system: Secondary | ICD-10-CM | POA: Diagnosis not present

## 2020-05-31 DIAGNOSIS — R350 Frequency of micturition: Secondary | ICD-10-CM | POA: Diagnosis not present

## 2020-05-31 LAB — POCT URINALYSIS DIPSTICK
Bilirubin, UA: NEGATIVE
Blood, UA: NEGATIVE
Glucose, UA: NEGATIVE
Ketones, UA: NEGATIVE
Leukocytes, UA: NEGATIVE
Nitrite, UA: NEGATIVE
Protein, UA: NEGATIVE
Spec Grav, UA: 1.02 (ref 1.010–1.025)
Urobilinogen, UA: 0.2 E.U./dL
pH, UA: 6 (ref 5.0–8.0)

## 2020-05-31 NOTE — Addendum Note (Signed)
Addended by: Shirlyn Goltz on: 05/31/2020 03:43 PM   Modules accepted: Orders

## 2020-05-31 NOTE — Addendum Note (Signed)
Addended by: Cresenciano Lick on: 05/31/2020 04:03 PM   Modules accepted: Orders

## 2020-05-31 NOTE — Progress Notes (Signed)
Amy Escobar is a 74 y.o. female with the following history as recorded in Epic[facility]:  Patient Active Problem List   Diagnosis Date Noted  . Diabetic neuropathy ([place]) 01/10/2020  . Hav (hallux abducto valgus), unspecified laterality 01/10/2020  . Chronic arthropathy 01/10/2020  . 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 edema associated with type 2 diabetes mellitus ([place]) 12/26/2019  . 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 AC (acromioclavicular) arthritis 08/02/2019  . 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 Claudication ([place]) 04/19/2019  . Morbid obesity ([place]) 04/19/2019  . Suspected COVID-19 virus infection 04/13/2019  . Upper airway cough syndrome 08/22/2018  . Greater trochanteric bursitis of both hips 06/15/2018  . Trigger point of shoulder region, left 02/07/2018  . Hypertension   . Mild pulmonary hypertension ([place])   . NSVT (nonsustained ventricular tachycardia) ([place])   . PVC's (premature ventricular contractions)   . URI (upper respiratory infection) 08/09/2016  . Neck pain 02/10/2016  . Urinary incontinence 02/10/2016  . Degenerative arthritis of knee, bilateral 07/17/2015  . Knee MCL sprain 06/07/2015  . Discomfort in chest 02/15/2015  . Chest pain 02/15/2015  . DM type 2, controlled, with complication ([place])   . Wellness examination 08/06/2014  . Acute meniscal tear of knee 02/13/2014  . Primary localized osteoarthrosis, lower leg 02/13/2014  . Gastrocnemius tear 12/25/2013  . UTI (urinary tract infection) 12/20/2013  . Right knee pain 12/20/2013  . Pulmonary hypertension ([place]) 10/06/2013  . DOE (dyspnea on exertion) 08/04/2013  . Dysphagia, unspecified(787.20) 08/10/2012  . Hip pain, left 02/02/2012  . Painful respiration 05/25/2011  . Encounter for long-term (current) use of other medications 01/30/2011  . PELVIC PAIN, LEFT 07/30/2010  . TOBACCO USE, QUIT 07/07/2010  .  FATIGUE 12/20/2009  . Headache 12/20/2009  . Low back pain 12/26/2008  . Disorder of liver 04/13/2008  . FOOT PAIN, BILATERAL 04/13/2008  . FIBROIDS, UTERUS 11/24/2007  . THYROID NODULE, LEFT 11/24/2007  . HYPERCHOLESTEROLEMIA 11/24/2007  . CARPAL TUNNEL SYNDROME, BILATERAL 11/24/2007  . ALKALINE PHOSPHATASE, ELEVATED 11/24/2007  . Gout 08/10/2007  . ANXIETY 08/10/2007  . Essential hypertension 08/10/2007  . Cough 08/01/2007  . Perennial and seasonal allergic rhinitis 01/14/2007  . GERD 01/14/2007  . Osteoporosis 01/14/2007    Current Outpatient Medications  Medication Sig Dispense Refill  . aspirin EC 81 MG tablet Take 81 mg by mouth daily.    . Azelastine-Fluticasone 137-50 MCG/ACT SUSP Place 1 spray into the nose 2 (two) times daily. 23 g 5  . benzonatate (TESSALON) 200 MG capsule TAKE ONE CAPSULE BY MOUTH THREE TIMES A DAY AS NEEDED FOR COUGH 45 capsule 2  . Carbinoxamine Maleate 4 MG TABS Take 1 tablet (4 mg total) by mouth every 8 (eight) hours as needed. 56 tablet 5  . carvedilol (COREG) 6.25 MG tablet Take 1 tablet (6.25 mg total) by mouth 2 (two) times daily. 180 tablet 2  . Chlorphen-Pseudoephed-APAP (CORICIDIN D PO) Take by mouth as needed.     . DULoxetine (CYMBALTA) 30 MG capsule Take 1 capsule (30 mg total) by mouth 2 (two) times daily. 60 capsule 3  . esomeprazole (NEXIUM) 40 MG capsule TAKE ONE CAPSULE BY MOUTH DAILY 30 TO 60 MINUTES BEFORE YOUR FIRST AND LAST MEALS OF THE DAY 180 capsule 3  . fesoterodine (TOVIAZ) 4 MG TB24 tablet Take 4 mg daily by mouth.    Marland Kitchen glucose blood (ONETOUCH VERIO) test strip Use to check blood sugar 2 times a  day. 200 each 2  . hydrocortisone 2.5 % lotion     . metFORMIN (GLUCOPHAGE-XR) 500 MG 24 hr tablet TAKE TWO TABLETS BY MOUTH TWICE A DAY 360 tablet 0  . Olopatadine HCl 0.2 % SOLN Place 1 drop into both eyes daily. 2.5 mL 12  . Respiratory Therapy Supplies MISC Use flutter valve as needed to break the coughing cycle. 1 each 1  .  rosuvastatin (CRESTOR) 5 MG tablet TAKE ONE TABLET BY MOUTH DAILY 90 tablet 1  . triamcinolone cream (KENALOG) 0.1 % APPLY 1 APPLICATION TOPICALLY 4 (FOUR) TIMES DAILY AS NEEDED FOR RASH 45 g 1  . UNKNOWN TO PATIENT Inject as directed every 6 (six) months. 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 valsartan (DIOVAN) 80 MG tablet TAKE ONE TABLET BY MOUTH DAILY 90 tablet 2  . Vitamin D, Ergocalciferol, (DRISDOL) 1.25 MG (50000 UNIT) CAPS capsule TAKE ONE CAPSULE BY MOUTH ONCE WEEKLY 12 capsule 0   No current facility-administered medications for this visit.    Allergies: Olmesartan medoxomil  Past Medical History:  Diagnosis Date  . Alkaline phosphatase deficiency    w/u Ne  . Allergic rhinitis   . Anxiety   . Arthritis   . DM2 (diabetes mellitus, type 2) ([place])   . GERD (gastroesophageal reflux disease)   . Gout   . Hemorrhoids   . Hyperlipidemia   . Hypertension   . Mild pulmonary hypertension ([place])   . NSVT (nonsustained ventricular tachycardia) ([place])   . Osteoporosis   . PVC's (premature ventricular contractions)   . Thyroid nodule    small  . Tubular adenoma of colon 2017  . Urticaria   . Uterine fibroid     Past Surgical History:  Procedure Laterality Date  . CATARACT EXTRACTION Bilateral 2016  . COLONOSCOPY  2007  . echocardiogram (other)  01/16/2002  . 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 stress cardiolite  02/12/2006  . TOENAIL EXCISION    . TUBAL LIGATION    . TYMPANOSTOMY TUBE PLACEMENT      Family History  Problem Relation Age of Onset  . Heart disease Mother   . Allergic rhinitis Mother   . Heart disease Father   . Heart disease Maternal Grandfather   . Rectal cancer Maternal Grandfather   . Stomach cancer Maternal Grandmother   . Colon cancer Neg Hx   . Breast cancer Neg Hx     Social History   Tobacco Use  . Smoking status: Former Smoker    Packs/day: 0.25    Years: 5.00    Pack years: 1.25    Types: Cigarettes    Quit date:  06/29/1968    Years since quitting: 51.9  . Smokeless tobacco: Never Used  Substance Use Topics  . Alcohol use: No    Alcohol/week: 0.0 standard drinks    Subjective:  Patient wanted to get a urine check today to make sure she does not have a UTI; has chronic bladder issues and was having some increased frequency in the past week; notes that she is actually feeling much better today;   Would also like to go over medications and get her medication list correct;    Objective:  Vitals:   05/31/20 1505  BP: 128/74  Pulse: 94  Temp: 98.6 F (37 C)  TempSrc: Oral  SpO2: 96%  Weight: 157 lb 12.8 oz (71.6 kg)  Height: 5\' 5"  (1.651 m)    General: Well developed,  well nourished, in no acute distress  Head: Normocephalic and atraumatic  Lungs: Respirations unlabored;  CVS exam: normal rate and regular rhythm.  Neurologic: Alert and oriented; speech intact; face symmetrical; moves all extremities well; CNII-XII intact without focal deficit   Assessment:  1. Urinary frequency     Plan:   U/A is unremarkable; will hold antibiotic at this time; check urine culture today to rule out underlying infection; follow-up to be determined;   Extensive medication review done with patient today- she has updated medication list at end of visit.  She will see her specialists as scheduled and follow up here prn.  This visit occurred during the SARS-CoV-2 public health emergency.  Safety protocols were in place, including screening questions prior to the visit, additional usage of staff PPE, and extensive cleaning of exam room while observing appropriate contact time as indicated for disinfecting solutions.      No follow-ups on file.  No orders of the defined types were placed in this encounter.   Requested Prescriptions    No prescriptions requested or ordered in this encounter

## 2020-05-31 NOTE — Addendum Note (Signed)
Addended by: Sherlene Shams on: 05/31/2020 03:59 PM   Modules accepted: Orders

## 2020-06-01 ENCOUNTER — Other Ambulatory Visit: Payer: Self-pay | Admitting: Family Medicine

## 2020-06-01 LAB — URINE CULTURE

## 2020-06-04 ENCOUNTER — Telehealth: Payer: Self-pay

## 2020-06-04 NOTE — Telephone Encounter (Signed)
Results given.

## 2020-06-17 NOTE — Progress Notes (Signed)
[place] Phone: [phone] Subjective:   [name], am serving as a scribe for Dr. Hulan Saas. This visit occurred during the SARS-CoV-2 public health emergency.  Safety protocols were in place, including screening questions prior to the visit, additional usage of staff PPE, and extensive cleaning of exam room while observing appropriate contact time as indicated for disinfecting solutions.  I'm seeing this patient by the request  of:  [name], FNP  CC: Bilateral knee pain follow-up  [id number]   04/09/2020 Bilateral injections given today, tolerated the procedure well, discussed icing regimen and home exercise, which activities to doing which wants to avoid.  Increase activity slowly.  Follow-up again in 12 weeks.  Chronic problem with exacerbation patient wants to avoid any surgical problems.  Patient has responded very well to the epidural in the back.  Can repeat if necessary.  Follow-up with me again in 3 months. Last epidural 03/11/2020.  Update 06/18/2020 Krystn Dermody is a 74 y.o. female coming in with complaint of bilateral knee pain. Pain each day.  Patient states that the knee injections do seem to help.  Has had viscosupplementation previously as well.  Patient feels like she would like to try it again.  This will keep her active.  Low back pain as well.  Did respond very well to last epidural in September.  Patient states that it seems to be stable    Past Medical History:  Diagnosis Date  . Alkaline phosphatase deficiency    w/u Ne  . Allergic rhinitis   . Anxiety   . Arthritis   . DM2 (diabetes mellitus, type 2) ([place])   . GERD (gastroesophageal reflux disease)   . Gout   . Hemorrhoids   . Hyperlipidemia   . Hypertension   . Mild pulmonary hypertension ([place])   . NSVT (nonsustained ventricular tachycardia) ([place])   . Osteoporosis   . PVC's (premature ventricular  contractions)   . Thyroid nodule    small  . Tubular adenoma of colon 2017  . Urticaria   . Uterine fibroid    Past Surgical History:  Procedure Laterality Date  . CATARACT EXTRACTION Bilateral 2016  . COLONOSCOPY  2007  . echocardiogram (other)  01/16/2002  . 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 stress cardiolite  02/12/2006  . TOENAIL EXCISION    . TUBAL LIGATION    . TYMPANOSTOMY TUBE PLACEMENT     Social History   Socioeconomic History  . Marital status: Widowed    Spouse name: Not on file  . Number of children: 2  . Years of education: Not on file  . Highest education level: Not on file  Occupational History  . Occupation: Surveyor, minerals: UNEMPLOYED  Tobacco Use  . Smoking status: Former Smoker    Packs/day: 0.25    Years: 5.00    Pack years: 1.25    Types: Cigarettes    Quit date: 06/29/1968    Years since quitting: 52.0  . Smokeless tobacco: Never Used  Vaping Use  . Vaping Use: Never used  Substance and Sexual Activity  . Alcohol use: No    Alcohol/week: 0.0 standard drinks  . Drug use: No  . Sexual activity: Not on file  Other Topics Concern  . Not on file  Social History Narrative   Widowed 2010.    Social Determinants of Health   Financial Resource Strain:  Not on file  Food Insecurity: Not on file  Transportation Needs: Not on file  Physical Activity: Not on file  Stress: Not on file  Social Connections: Not on file   Allergies  Allergen Reactions  . Olmesartan Medoxomil     REACTION: headache   Family History  Problem Relation Age of Onset  . Heart disease Mother   . Allergic rhinitis Mother   . Heart disease Father   . Heart disease Maternal Grandfather   . Rectal cancer Maternal Grandfather   . Stomach cancer Maternal Grandmother   . Colon cancer Neg Hx   . Breast cancer Neg Hx     Current Outpatient Medications (Endocrine & Metabolic):  .  metFORMIN (GLUCOPHAGE-XR) 500 MG 24 hr tablet, TAKE TWO TABLETS BY MOUTH  TWICE A DAY  Current Outpatient Medications (Cardiovascular):  .  carvedilol (COREG) 6.25 MG tablet, Take 1 tablet (6.25 mg total) by mouth 2 (two) times daily. .  rosuvastatin (CRESTOR) 5 MG tablet, TAKE ONE TABLET BY MOUTH DAILY .  valsartan (DIOVAN) 80 MG tablet, TAKE ONE TABLET BY MOUTH DAILY  Current Outpatient Medications (Respiratory):  Marland Kitchen  Azelastine-Fluticasone 137-50 MCG/ACT SUSP, Place 1 spray into the nose 2 (two) times daily. .  benzonatate (TESSALON) 200 MG capsule, TAKE ONE CAPSULE BY MOUTH THREE TIMES A DAY AS NEEDED FOR COUGH .  Carbinoxamine Maleate 4 MG TABS, Take 1 tablet (4 mg total) by mouth every 8 (eight) hours as needed. .  Chlorphen-Pseudoephed-APAP (CORICIDIN D PO), Take by mouth as needed.   Current Outpatient Medications (Analgesics):  .  aspirin EC 81 MG tablet, Take 81 mg by mouth daily.   Current Outpatient Medications (Other):  Marland Kitchen  DULoxetine (CYMBALTA) 30 MG capsule, Take 1 capsule (30 mg total) by mouth 2 (two) times daily. Marland Kitchen  esomeprazole (NEXIUM) 40 MG capsule, TAKE ONE CAPSULE BY MOUTH DAILY 30 TO 60 MINUTES BEFORE YOUR FIRST AND LAST MEALS OF THE DAY .  fesoterodine (TOVIAZ) 4 MG TB24 tablet, Take 4 mg daily by mouth. Marland Kitchen  glucose blood (ONETOUCH VERIO) test strip, Use to check blood sugar 2 times a day. .  hydrocortisone 2.5 % lotion,  .  Olopatadine HCl 0.2 % SOLN, Place 1 drop into both eyes daily. Marland Kitchen  Respiratory Therapy Supplies MISC, Use flutter valve as needed to break the coughing cycle. .  triamcinolone cream (KENALOG) 0.1 %, APPLY 1 APPLICATION TOPICALLY 4 (FOUR) TIMES DAILY AS NEEDED FOR RASH .  UNKNOWN TO PATIENT, Inject as directed every 6 (six) months. 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  Vitamin D, Ergocalciferol, (DRISDOL) 1.25 MG (50000 UNIT) CAPS capsule, TAKE ONE CAPSULE BY MOUTH ONCE WEEKLY   Reviewed prior external information including notes and imaging from  primary care provider As well as notes that were  available from care everywhere and other healthcare systems.  Past medical history, social, surgical and family history all reviewed in electronic medical record.  No pertanent information unless stated regarding to the chief complaint.   Review of Systems:  No headache, visual changes, nausea, vomiting, diarrhea, constipation, dizziness, abdominal pain, skin rash, fevers, chills, night sweats, weight loss, swollen lymph nodes, , joint swelling, chest pain, shortness of breath, mood changes. POSITIVE muscle aches, body aches  Objective  Blood pressure 112/72, pulse 82, height 5\' 5"  (1.651 m), weight 157 lb (71.2 kg), SpO2 96 %.   General: No apparent distress alert and oriented x3 mood and affect normal, dressed appropriately.  HEENT:  Pupils equal, extraocular movements intact  Respiratory: Patient's speak in full sentences and does not appear short of breath  Cardiovascular: No lower extremity edema, non tender, no erythema  Knee: Bilateral valgus deformity noted.  Abnormal thigh to calf ratio.  Tender to palpation over medial and PF joint line.  ROM lacks last 5 degrees of flexion and extension bilaterally instability with valgus force.  painful patellar compression. Patellar glide with moderate crepitus. Patellar and quadriceps tendons unremarkable. Hamstring and quadriceps strength is normal.  After informed written and verbal consent, patient was seated on exam table. Right knee was prepped with alcohol swab and utilizing anterolateral approach, patient's right knee space was injected with 60 mg per 3 mL of Durolane (sodium hyaluronate) in a prefilled syringe was injected easily into the knee through a 22-gauge needle..Patient tolerated the procedure well without immediate complications.  After informed written and verbal consent, patient was seated on exam table. Left knee was prepped with alcohol swab and utilizing anterolateral approach, patient's left knee space was injected with  60 mg per 3 mL of Durolane (sodium hyaluronate) in a prefilled syringe was injected easily into the knee through a 22-gauge needle..Patient tolerated the procedure well without immediate complications.   Impression and Recommendations:     The above documentation has been reviewed and is accurate and complete Lyndal Pulley, DO

## 2020-06-18 ENCOUNTER — Ambulatory Visit (INDEPENDENT_AMBULATORY_CARE_PROVIDER_SITE_OTHER): Payer: Medicare Other | Admitting: Family Medicine

## 2020-06-18 ENCOUNTER — Other Ambulatory Visit: Payer: Self-pay

## 2020-06-18 ENCOUNTER — Encounter: Payer: Self-pay | Admitting: Family Medicine

## 2020-06-18 DIAGNOSIS — M170 Bilateral primary osteoarthritis of knee: Secondary | ICD-10-CM | POA: Diagnosis not present

## 2020-06-18 NOTE — Assessment & Plan Note (Signed)
Chronic problem with exacerbation.  Discussed icing regimen and home exercises which activities to do which wants to avoid.  Increase activity slowly.  Follow-up again 8 to 12 weeks.

## 2020-06-18 NOTE — Patient Instructions (Signed)
Gel injections today Pennsaid 2x a day finger tip sized amount See me again in 10-12 weeks

## 2020-07-02 ENCOUNTER — Ambulatory Visit (INDEPENDENT_AMBULATORY_CARE_PROVIDER_SITE_OTHER): Payer: Medicare Other | Admitting: Internal Medicine

## 2020-07-02 ENCOUNTER — Other Ambulatory Visit: Payer: Self-pay

## 2020-07-02 ENCOUNTER — Ambulatory Visit: Payer: Medicare Other | Admitting: Internal Medicine

## 2020-07-02 ENCOUNTER — Encounter: Payer: Self-pay | Admitting: Internal Medicine

## 2020-07-02 DIAGNOSIS — R0600 Dyspnea, unspecified: Secondary | ICD-10-CM

## 2020-07-02 DIAGNOSIS — R0609 Other forms of dyspnea: Secondary | ICD-10-CM

## 2020-07-02 DIAGNOSIS — Z03818 Encounter for observation for suspected exposure to other biological agents ruled out: Secondary | ICD-10-CM | POA: Diagnosis not present

## 2020-07-02 DIAGNOSIS — R058 Other specified cough: Secondary | ICD-10-CM

## 2020-07-02 LAB — PULMONARY FUNCTION TEST
DL/VA % pred: 85 %
DL/VA: 3.53 ml/min/mmHg/L
DLCO cor % pred: 111 %
DLCO cor: 21.45 ml/min/mmHg
DLCO unc % pred: 111 %
DLCO unc: 21.45 ml/min/mmHg
FEF 25-75 Post: 1.61 L/sec
FEF 25-75 Pre: 1.21 L/sec
FEF2575-%Change-Post: 33 %
FEF2575-%Pred-Post: 104 %
FEF2575-%Pred-Pre: 78 %
FEV1-%Change-Post: 13 %
FEV1-%Pred-Post: 86 %
FEV1-%Pred-Pre: 75 %
FEV1-Post: 1.49 L
FEV1-Pre: 1.31 L
FEV1FVC-%Change-Post: 29 %
FEV1FVC-%Pred-Pre: 97 %
FEV6-%Change-Post: -6 %
FEV6-%Pred-Post: 71 %
FEV6-%Pred-Pre: 76 %
FEV6-Post: 1.54 L
FEV6-Pre: 1.64 L
FEV6FVC-%Pred-Post: 104 %
FEV6FVC-%Pred-Pre: 104 %
FVC-%Change-Post: -11 %
FVC-%Pred-Post: 68 %
FVC-%Pred-Pre: 78 %
FVC-Post: 1.54 L
FVC-Pre: 1.75 L
Post FEV1/FVC ratio: 97 %
Post FEV6/FVC ratio: 100 %
Pre FEV1/FVC ratio: 75 %
Pre FEV6/FVC Ratio: 100 %
RV % pred: 55 %
RV: 1.27 L
TLC % pred: 58 %
TLC: 2.94 L

## 2020-07-02 MED ORDER — FAMOTIDINE 20 MG PO TABS: ORAL_TABLET | ORAL | 11 refills | Status: DC

## 2020-07-02 MED ORDER — MONTELUKAST SODIUM 10 MG PO TABS: 10.0000 mg | ORAL_TABLET | Freq: Every day | ORAL | 11 refills | Status: DC

## 2020-07-02 NOTE — Assessment & Plan Note (Signed)
Onset 2015 - spirometry 08/04/13 truncated in a non-physiologic pattern  - 08/08/2013  Walked RA x 3 laps @ 185 ft each stopped due to end of study no desat  - PFT's 09/12/2013 wnl  - 05/08/2019   Walked RA  2 laps @  approx 291ft each @ avg pace  stopped due to  End of study, mild sob with sats still 93%  And nl cxr  -  05/06/2020   Walked RA  2 laps @ approx 230ft each @ avg pace with cane    stopped due to end of study, min sob with sats 94% at end  - PFT's  07/02/2020  FEV1 1.49 (86 % ) ratio 0.97  p 13 % improvement from saba p 0 prior to study with DLCO  21.45 (111%) corrects to 3.53 (84%)  for alv volume and FV curve nl     Response to prednisone even if transient suggests asthma but I believe most of her symptoms are being driven by allergic rhinitis/ pnds since no worse off all inhalers and no better when on them > continue singulair/ f/u allergy

## 2020-07-02 NOTE — Assessment & Plan Note (Signed)
Onset was around 2013  -  Neg egd by Russella Dar 09/06/12  - Allergy profile 08/22/2018 >  Eos 0.2 /  IgE  2095 RAST pos for everything x cats  - 08/22/2018  gabapentin changed from 600 at hs to 300 mg each am and 600 at bedtime  - 04/19/2019 cough flared off gabapentin > resume plus add singulair trial   - Allergy eval 06/26/2019 [name] skin testing revealed reactivity to grass pollen, ragweed pollen, weed pollen, tree pollen, molds, cockroach antigen, and dust mite antigen > rec add dymista  - 05/06/2020 cough worse off gabapentin > try max gerd rx and short course pred then if not better titrate up gabapentin to max of 300mg  qid plus pred  12 days and f/u allergy > transiently improved on prednisone 07/02/2020 but still noct cough so rec 1st gen H1 blockers per guidelines  X 8 mg hs plus pepcid hs and f/u allergy as prev rec  Daytime cough is better, no change noct cough though has not followed recs for max 1st gen H1 blockers per guidelines  And if not better next step is return to allergy for rx, f/u here prn          Each maintenance medication was reviewed in detail including emphasizing most importantly the difference between maintenance and prns and under what circumstances the prns are to be triggered using an action plan format where appropriate.  Total time for H and P, chart review, counseling, teaching nasal  device and generating customized AVS unique to this office visit / charting = 31 min

## 2020-07-02 NOTE — Patient Instructions (Signed)
For drainage / throat tickle try take CHLORPHENIRAMINE  4 mg  (Chlortab 4mg   at should be easiest to find in the green box)  take one every 4 hours as needed - available over the counter- may cause drowsiness so take 2  @ bedtime  Along with your pepcid 20 mg to see if helps night time cough  GERD (REFLUX)  is an extremely common cause of respiratory symptoms just like yours , many times with no obvious heartburn at all.    It can be treated with medication, but also with lifestyle changes including elevation of the head of your bed (ideally with 6-8inch blocks under the headboard of your bed),  Smoking cessation, avoidance of late meals, excessive alcohol, and avoid fatty foods, chocolate, peppermint, colas, red wine, and acidic juices such as orange juice.  NO MINT OR MENTHOL PRODUCTS SO NO COUGH DROPS  USE SUGARLESS CANDY INSTEAD (Jolley ranchers or Stover's or Life Savers) or even ice chips will also do - the key is to swallow to prevent all throat clearing. NO OIL BASED VITAMINS - use powdered substitutes.  Avoid fish oil when coughing.  Pulmonary follow up is as needed but I strongly advise you to bring all medications with you to be sure our list reflects exactly what you take - do same for Dr [employer]

## 2020-07-02 NOTE — Progress Notes (Signed)
Amy Escobar, female    DOB: 05/16/1946    MRN: [id number]   Brief patient profile:  78 yobf quit smoking for good 1980s with h/o GERD going back to at least 2010 with neg egd by Russella Dar 09/06/12   And some tendency for colds to settle in sinuses and tendency for pnds req daily zyrtec x 2018 - was eval in pulmonary clinic with nl pfts/unexplained doe and cough attributed to uacs and started on gabapentin but did not return p 09/12/13 with  recs  As follows:  Increase neurontin to 300 mg  three times a day to see if helps your cough  If still have drainage a recommend a trial of zyrtec 10 mg daily and possible an allergy evaluation   Improved p this eval and req gabapentin up to 600 tid for dm neuropathy but adjusted this down on her own due to excess dayime drowsiness at only taking 600 mg at when self referred back to pulmonary clinic 08/22/2018 due to cough since mid Jan 2020 s/p 2 zpaks     History of Present Illness  08/22/2018  Pulmonary/ 1st office eval/[name]  Chief Complaint  Patient presents with  . Pulmonary Consult    Self referral. Pt c/o cough x 5 wks- currently prod with clear sputum.   Dyspnea:  Does flat and slow = MMRC2 = can't walk a nl pace on a flat grade s sob but does fine slow and flat also limited by knees  Cough: urge to clear throat x 2013 / pred did not help / daytime aggravated by voice use  Sleep: on one pillow / bed is flat sometimes wakes her up / rec Increase your nexium to 40 mg Take 30- 60 min before your first and last meals of the day  Increase your gabapentin to 300 mg each am and continue 600 mg at bedtime  For cough as needed >  Tessalon 200 mg up to every 6 hours and the goal is no coughing at all  GERD  Diet       11/08/2018  f/u ov/[name] re: uacs improved vs prior  Chief Complaint  Patient presents with  . Follow-up    Cough is about the same. No new co's today.   Dyspnea:  MMRC1 = can walk nl pace, flat grade, can't hurry or go uphills or  steps s sob  Cough: improved but needing tessilon 200 up to twice daily  Sleeping: on wedge wake up with dry cough about  twice weekly  SABA use: none  02: none  rec For drainage / throat tickle try take CHLORPHENIRAMINE  4 mg  (Chlortab 4mg   at should be easiest to find in the green box)  take one every 4 hours as needed - available over the counter- may cause drowsiness so start with just a bedtime dose or two and see how you tolerate it before trying in daytime Ok to take  zyrtec otc daily  but you may find you don't need it anymore    - try and see  Please schedule a follow up visit in 6 months but call sooner if needed  - consider adding singulair if hasn't tried it then allergy eval next step        04/19/19 televist For drainage / throat tickle try take CHLORPHENIRAMINE  4 mg  (Chlortab 4mg   at 04/21/19 should be easiest to find in the green box)  take one every 4 hours  as needed - available over the counter and take 2 tablets about one hour before bed  Renewed Tessalon to take as needed for  Restart gabapentin  300 mg in am  600 mg after supper  Start singulair 10 mg each pm   Keep your previous appt to see me but  Please bring  all medications /inhalers/ solutions in hand so we can verify exactly what you are taking. This includes all medications from all doctors and over the [place] separate them into two bags:  the ones you take automatically, no matter what, vs the ones you take just when you feel you need them "BAG #2 is UP TO YOU"  - this will really help [place] help you take your medications more effectively.    05/08/2019  f/u ov/[name] re: cough x 2013 - brought most of her meds no h1/ no gabapentin Chief Complaint  Patient presents with  . Follow-up    Cough-"sometimes I feel like it's worse"- occ prod with white or clear sputum.   Dyspnea: housework  Cough: sporadic/ tessalon helps when takes it  Sleeping: wakes up sev times a week  coughing  SABA use: none  02: none  rec Chlorpheniramine should be to take 2 bedtime  Continue singulair 10 mg each pm  During the day can take tessalon 200 mg every 6 hours if needed  Follow up here is as needed once you establish with Dr Neldon Mc > Allergy eval 06/26/2019 [name] skin testing revealed reactivity to grass pollen, ragweed pollen, weed pollen, tree pollen, molds, cockroach antigen, and dust mite antigen.  10/23/2019 reported to [facility] that cough resolved   04/05/20 acute visit with NP off singulair worse rec - Continue DELSYM  - Resume Singulair - Continue Azelastine nasal spray twice daily - Ok to continue Carbinoxamine  CXR  showed diffuse interstitial thickening likely reflecting chronic bronchitis; no evidence of pneumonia or pleural effusion. Plan check PFTs with [facility], if diffusion capacity is abnormal would check HRCT.    05/06/2020  f/u ov/[name] re:  Cough since 2013  Now ? ILD / did not bring meds/ stopped gapapentin  Chief Complaint  Patient presents with  . Follow-up    Breathing has improved some since the last visit. She has cough with clear sputum- worse at night.   Dyspnea:  More limited by knee than breathing walks with cane  Cough: waking up most nocts 1-3 am  Sleeping: sleep on wedge pillow bed is flat  SABA use: none  02: none  rec You have really bad allergies which are likely contributing to your night time cough  Prednisone 10 mg take  4 each am x 2 days,   2 each am x 2 days,  1 each am x 2 days and stop  Add pepcid 20 mg (otc)  take on hour before bed along with Chorpheramine 4 mg x 2  Instead of the late dose of carbinoxime  Please remember to go to the  x-ray department  for your tests - we will call you with the results when they are available   Make an appt to see Dr [facility]'s practice next available and take all medications with you  If cough not better work you way back up to 300 mg 4 x day (or whatever dose you can tolerate the  cough is better but do not exceed 1200 mg per day total)   I will see you back the week  after your PFTs  ADD:  PREDNISONE should have read Take 4 for three days 3 for three days 2 for three days 1 for three days and stop    07/02/2020  f/u ov/Othel Dicostanzo re: cough 2013  Prednisone always transiently helps  Chief Complaint  Patient presents with  . Follow-up    Cough is slightly better but she still has prod cough with clear to white sputum.   Dyspnea:  Walks with cane / Not limited by breathing from desired activities   Cough: better p prednisone esp the one at 2 am   Sleeping:  Wakes up nightly sev hours p hs taking pepcid and 1st gen H1 blockers per guidelines  But just the one x 4 mg, never tried 2 hs as rec above SABA use: none  02: none    No obvious day to day or daytime variability or assoc excess/ purulent sputum or mucus plugs or hemoptysis or cp or chest tightness, subjective wheeze or overt sinus or hb symptoms.     Also denies any obvious fluctuation of symptoms with weather or environmental changes or other aggravating or alleviating factors except as outlined above   No unusual exposure hx or h/o childhood pna/ asthma or knowledge of premature birth.  Current Allergies, Complete Past Medical History, Past Surgical History, Family History, and Social History were reviewed in [employer] record.  ROS  The following are not active complaints unless bolded Hoarseness, sore throat, dysphagia, dental problems, itching, sneezing,  nasal congestion or discharge of excess mucus or purulent secretions, ear ache,   fever, chills, sweats, unintended wt loss or wt gain, classically pleuritic or exertional cp,  orthopnea pnd or arm/hand swelling  or leg swelling, presyncope, palpitations, abdominal pain, anorexia, nausea, vomiting, diarrhea  or change in bowel habits or change in bladder habits, change in stools or change in urine, dysuria, hematuria,  rash, arthralgias,  visual complaints, headache, numbness, weakness or ataxia or problems with walking or coordination,  change in mood or  memory.        Current Meds  Medication Sig  . aspirin EC 81 MG tablet Take 81 mg by mouth daily.  . Azelastine-Fluticasone 137-50 MCG/ACT SUSP Place 1 spray into the nose 2 (two) times daily.  . benzonatate (TESSALON) 200 MG capsule TAKE ONE CAPSULE BY MOUTH THREE TIMES A DAY AS NEEDED FOR COUGH  . Carbinoxamine Maleate 4 MG TABS Take 1 tablet (4 mg total) by mouth every 8 (eight) hours as needed.  . carvedilol (COREG) 6.25 MG tablet Take 1 tablet (6.25 mg total) by mouth 2 (two) times daily.  . Chlorphen-Pseudoephed-APAP (CORICIDIN D PO) Take by mouth as needed.   . DULoxetine (CYMBALTA) 30 MG capsule Take 1 capsule (30 mg total) by mouth 2 (two) times daily.  Marland Kitchen esomeprazole (NEXIUM) 40 MG capsule TAKE ONE CAPSULE BY MOUTH DAILY 30 TO 60 MINUTES BEFORE YOUR FIRST AND LAST MEALS OF THE DAY  . fesoterodine (TOVIAZ) 4 MG TB24 tablet Take 4 mg daily by mouth.  Marland Kitchen glucose blood (ONETOUCH VERIO) test strip Use to check blood sugar 2 times a day.  . hydrocortisone 2.5 % lotion   . metFORMIN (GLUCOPHAGE-XR) 500 MG 24 hr tablet TAKE TWO TABLETS BY MOUTH TWICE A DAY  . Olopatadine HCl 0.2 % SOLN Place 1 drop into both eyes daily.  Marland Kitchen Respiratory Therapy Supplies MISC Use flutter valve as needed to break the coughing cycle.  . rosuvastatin (CRESTOR) 5 MG tablet TAKE ONE TABLET BY MOUTH  DAILY  . triamcinolone cream (KENALOG) 0.1 % APPLY 1 APPLICATION TOPICALLY 4 (FOUR) TIMES DAILY AS NEEDED FOR RASH  . UNKNOWN TO PATIENT Inject as directed every 6 (six) months. 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 valsartan (DIOVAN) 80 MG tablet TAKE ONE TABLET BY MOUTH DAILY  . Vitamin D, Ergocalciferol, (DRISDOL) 1.25 MG (50000 UNIT) CAPS capsule TAKE ONE CAPSULE BY MOUTH ONCE WEEKLY            Objective:     07/02/2020          161 05/06/2020        156  05/08/2019        154    11/08/18 173 lb (78.5 kg)  10/26/18 167 lb (75.8 kg)  09/14/18 162 lb (73.5 kg)    Vital signs reviewed  07/02/2020  - Note at rest 02 sats  98% on RA   General appearance:    amb bf a bit iffy on details of care   HEENT : pt wearing mask not removed for exam due to covid -19 concerns.    NECK :  without JVD/Nodes/TM/ nl carotid upstrokes bilaterally   LUNGS: no acc muscle use,  Nl contour chest which is clear to A and P bilaterally without cough on insp or exp maneuvers   CV:  RRR  no s3 or murmur or increase in P2, and no edema   ABD:  soft and nontender with nl inspiratory excursion in the supine position. No bruits or organomegaly appreciated, bowel sounds nl  MS:  Nl gait/ ext warm without deformities, calf tenderness, cyanosis or clubbing No obvious joint restrictions   SKIN: warm and dry without lesions    NEURO:  alert, approp, nl sensorium with  no motor or cerebellar deficits apparent.         Assessment

## 2020-07-02 NOTE — Progress Notes (Signed)
PFT completed today.  

## 2020-07-08 ENCOUNTER — Other Ambulatory Visit: Payer: Self-pay | Admitting: Internal Medicine

## 2020-07-08 DIAGNOSIS — R0600 Dyspnea, unspecified: Secondary | ICD-10-CM

## 2020-07-08 DIAGNOSIS — R0609 Other forms of dyspnea: Secondary | ICD-10-CM

## 2020-07-09 ENCOUNTER — Other Ambulatory Visit: Payer: Self-pay

## 2020-07-10 ENCOUNTER — Other Ambulatory Visit: Payer: Self-pay | Admitting: Internal Medicine

## 2020-07-10 ENCOUNTER — Telehealth: Payer: Self-pay | Admitting: Family

## 2020-07-10 DIAGNOSIS — R058 Other specified cough: Secondary | ICD-10-CM

## 2020-07-10 NOTE — Telephone Encounter (Signed)
Patient called and said that her systolic number has been running in the 140's-150's. She was wondering if any of her medications could be causing this. Please give her a call back at [phone].

## 2020-07-10 NOTE — Telephone Encounter (Signed)
Dr. Melvyn Novas, please advise if you are okay with [place] refilling med for pt.

## 2020-07-12 ENCOUNTER — Other Ambulatory Visit: Payer: Self-pay | Admitting: Endocrinology

## 2020-07-17 ENCOUNTER — Telehealth (INDEPENDENT_AMBULATORY_CARE_PROVIDER_SITE_OTHER): Payer: Medicare Other | Admitting: Family

## 2020-07-17 ENCOUNTER — Ambulatory Visit (INDEPENDENT_AMBULATORY_CARE_PROVIDER_SITE_OTHER): Payer: Medicare Other

## 2020-07-17 DIAGNOSIS — R5383 Other fatigue: Secondary | ICD-10-CM

## 2020-07-17 DIAGNOSIS — Z0000 Encounter for general adult medical examination without abnormal findings: Secondary | ICD-10-CM

## 2020-07-17 NOTE — Progress Notes (Addendum)
I connected with Amy Escobar today by telephone and verified that I am speaking with the correct person using two identifiers. Location patient: home Location provider: work Persons participating in the virtual visit: Axel Hackett and [place], LPN.   I discussed the limitations, risks, security and privacy concerns of performing an evaluation and management service by telephone and the availability of in person appointments. I also discussed with the patient that there may be a patient responsible charge related to this service. The patient expressed understanding and verbally consented to this telephonic visit.    Interactive audio and video telecommunications were attempted between this provider and patient, however failed, due to patient having technical difficulties OR patient did not have access to video capability.  We continued and completed visit with audio only.  Some vital signs may be absent or patient reported.   Time Spent with patient on telephone encounter: 30 minutes  Subjective:   Amy Escobar is a 75 y.o. female who presents for Medicare Annual (Subsequent) preventive examination.  Review of Systems    No ROS. Medicare Wellness Visit. Additional risk factors are reflected in social history. Cardiac Risk Factors include: advanced age (>17men, >67 women);diabetes mellitus;dyslipidemia;hypertension;family history of premature cardiovascular disease     Objective:    There were no vitals filed for this visit. There is no height or weight on file to calculate BMI.  Advanced Directives 07/17/2020 07/12/2019 04/01/2018 03/17/2018 08/12/2016 09/19/2015 08/09/2015  Does Patient Have a Medical Advance Directive? Yes Yes Yes Yes No Yes No  Type of Advance Directive - [place];Living will [place];Living will - - [place];Living will -  Does patient want to make changes to medical advance directive? No - Patient  declined No - Patient declined - - - - -  Copy of Press photographer in Chart? - - Yes - - - -  Would patient like information on creating a medical advance directive? - - - - - - -    Current Medications (verified) Outpatient Encounter Medications as of 07/17/2020  Medication Sig  . aspirin EC 81 MG tablet Take 81 mg by mouth daily.  . Azelastine-Fluticasone 137-50 MCG/ACT SUSP Place 1 spray into the nose 2 (two) times daily.  . benzonatate (TESSALON) 200 MG capsule TAKE ONE CAPSULE BY MOUTH THREE TIMES A DAY AS NEEDED FOR COUGH  . Carbinoxamine Maleate 4 MG TABS Take 1 tablet (4 mg total) by mouth every 8 (eight) hours as needed.  . carvedilol (COREG) 6.25 MG tablet Take 1 tablet (6.25 mg total) by mouth 2 (two) times daily.  . DULoxetine (CYMBALTA) 30 MG capsule Take 1 capsule (30 mg total) by mouth 2 (two) times daily.  Marland Kitchen esomeprazole (NEXIUM) 40 MG capsule TAKE ONE CAPSULE BY MOUTH DAILY 30 TO 60 MINUTES BEFORE YOUR FIRST AND LAST MEALS OF THE DAY  . famotidine (PEPCID) 20 MG tablet One at bedtime  . fesoterodine (TOVIAZ) 4 MG TB24 tablet Take 4 mg daily by mouth.  Marland Kitchen glucose blood (ONETOUCH VERIO) test strip Use to check blood sugar 2 times a day.  . hydrocortisone 2.5 % lotion   . metFORMIN (GLUCOPHAGE-XR) 500 MG 24 hr tablet TAKE TWO TABLETS BY MOUTH TWICE A DAY  . montelukast (SINGULAIR) 10 MG tablet Take 1 tablet (10 mg total) by mouth at bedtime.  . Olopatadine HCl 0.2 % SOLN Place 1 drop into both eyes daily.  Marland Kitchen Respiratory Therapy Supplies MISC Use  flutter valve as needed to break the coughing cycle.  . rosuvastatin (CRESTOR) 5 MG tablet TAKE ONE TABLET BY MOUTH DAILY  . triamcinolone cream (KENALOG) 0.1 % APPLY 1 APPLICATION TOPICALLY 4 (FOUR) TIMES DAILY AS NEEDED FOR RASH  . UNKNOWN TO PATIENT Inject as directed every 6 (six) months. 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 valsartan (DIOVAN) 80 MG tablet TAKE ONE TABLET BY MOUTH DAILY  . Vitamin D,  Ergocalciferol, (DRISDOL) 1.25 MG (50000 UNIT) CAPS capsule TAKE ONE CAPSULE BY MOUTH ONCE WEEKLY   No facility-administered encounter medications on file as of 07/17/2020.    Allergies (verified) Olmesartan medoxomil   History: Past Medical History:  Diagnosis Date  . Alkaline phosphatase deficiency    w/u Ne  . Allergic rhinitis   . Anxiety   . Arthritis   . DM2 (diabetes mellitus, type 2) ([place])   . GERD (gastroesophageal reflux disease)   . Gout   . Hemorrhoids   . Hyperlipidemia   . Hypertension   . Mild pulmonary hypertension ([place])   . NSVT (nonsustained ventricular tachycardia) ([place])   . Osteoporosis   . PVC's (premature ventricular contractions)   . Thyroid nodule    small  . Tubular adenoma of colon 2017  . Urticaria   . Uterine fibroid    Past Surgical History:  Procedure Laterality Date  . CATARACT EXTRACTION Bilateral 2016  . COLONOSCOPY  2007  . echocardiogram (other)  01/16/2002  . 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 stress cardiolite  02/12/2006  . TOENAIL EXCISION    . TUBAL LIGATION    . TYMPANOSTOMY TUBE PLACEMENT     Family History  Problem Relation Age of Onset  . Heart disease Mother   . Allergic rhinitis Mother   . Heart disease Father   . Heart disease Maternal Grandfather   . Rectal cancer Maternal Grandfather   . Stomach cancer Maternal Grandmother   . Colon cancer Neg Hx   . Breast cancer Neg Hx    Social History   Socioeconomic History  . Marital status: Widowed    Spouse name: Not on file  . Number of children: 2  . Years of education: Not on file  . Highest education level: Not on file  Occupational History  . Occupation: Surveyor, minerals: UNEMPLOYED  Tobacco Use  . Smoking status: Former Smoker    Packs/day: 0.25    Years: 5.00    Pack years: 1.25    Types: Cigarettes    Quit date: 06/29/1968    Years since quitting: 52.0  . Smokeless tobacco: Never Used  Vaping Use  . Vaping Use: Never used  Substance  and Sexual Activity  . Alcohol use: No    Alcohol/week: 0.0 standard drinks  . Drug use: No  . Sexual activity: Not on file  Other Topics Concern  . Not on file  Social History Narrative   Widowed 2010.    Social Determinants of Health   Financial Resource Strain: Low Risk   . Difficulty of Paying Living Expenses: Not hard at all  Food Insecurity: No Food Insecurity  . Worried About Charity fundraiser in the Last Year: Never true  . Ran Out of Food in the Last Year: Never true  Transportation Needs: No Transportation Needs  . Lack of Transportation (Medical): No  . Lack of Transportation (Non-Medical): No  Physical Activity: Sufficiently Active  . Days of Exercise per Week:  5 days  . Minutes of Exercise per Session: 30 min  Stress: No Stress Concern Present  . Feeling of Stress : Not at all  Social Connections: Moderately Integrated  . Frequency of Communication with Friends and Family: More than three times a week  . Frequency of Social Gatherings with Friends and Family: Once a week  . Attends Religious Services: More than 4 times per year  . Active Member of Clubs or Organizations: No  . Attends Archivist Meetings: More than 4 times per year  . Marital Status: Widowed    Tobacco Counseling Counseling given: Not Answered   Clinical Intake:  Pre-visit preparation completed: Yes  Pain : No/denies pain     Nutritional Risks: None Diabetes: Yes CBG done?: No Did pt. bring in CBG monitor from home?: No  How often do you need to have someone help you when you read instructions, pamphlets, or other written materials from your doctor or pharmacy?: 1 - Never What is the last grade level you completed in school?: Associate's degree  Diabetic? yes  Interpreter Needed?: No  Information entered by :: Lisette Abu, LPN   Activities of Daily Living In your present state of health, do you have any difficulty performing the following activities:  07/17/2020 05/31/2020  Hearing? N N  Vision? N N  Difficulty concentrating or making decisions? N N  Walking or climbing stairs? N N  Dressing or bathing? N N  Doing errands, shopping? N N  Preparing Food and eating ? N -  Using the Toilet? N -  In the past six months, have you accidently leaked urine? Y -  Do you have problems with loss of bowel control? N -  Managing your Medications? N -  Managing your Finances? N -  Housekeeping or managing your Housekeeping? N -  Some recent data might be hidden    Patient Care Team: [name], [place] as PCP - General (Internal Medicine) Dorothy Spark, MD as PCP - Cardiology (Cardiology) Unice Bailey, MD (Internal Medicine) Earnstine Regal, PA-C as Physician Assistant (Obstetrics and Gynecology) Belva Crome, MD as Consulting Physician (Cardiology) Inda Castle, MD (Inactive) as Consulting Physician (Gastroenterology) Zadie Rhine Clent Demark, MD as Consulting Physician (Ophthalmology) Tanda Rockers, MD as Consulting Physician (Pulmonary Disease) Lyndal Pulley, DO as Attending Physician (Family Medicine)  Indicate any recent Medical Services you may have received from other than [facility] providers in the past year (date may be approximate).     Assessment:   This is a routine wellness examination for [facility].  Hearing/Vision screen No exam data present  Dietary issues and exercise activities discussed: Current Exercise Habits: Home exercise routine, Type of exercise: walking, Time (Minutes): 30, Frequency (Times/Week): 5, Weekly Exercise (Minutes/Week): 150, Intensity: Mild, Exercise limited by: None identified  Goals    .  Patient Stated (pt-stated)      My goal is to get off some of these medicines and get down to 140 pounds.      Depression Screen PHQ 2/9 Scores 07/17/2020 05/31/2020 03/31/2019  PHQ - 2 Score 0 0 0  PHQ- 9 Score - - 0    Fall Risk Fall Risk  07/17/2020 05/31/2020 03/31/2019 03/17/2017 06/16/2016  Falls  in the past year? 0 0 0 No No  Number falls in past yr: 0 0 0 - -  Injury with Fall? 0 0 0 - -  Risk for fall due to : No Fall Risks - - - -  Follow up - - Falls evaluation completed - -    FALL RISK PREVENTION PERTAINING TO THE HOME:  Any stairs in or around the home? Yes  If so, are there any without handrails? No  Home free of loose throw rugs in walkways, pet beds, electrical cords, etc? Yes  Adequate lighting in your home to reduce risk of falls? Yes   ASSISTIVE DEVICES UTILIZED TO PREVENT FALLS:  Life alert? No  Use of a cane, walker or w/c? Yes  Grab bars in the bathroom? No  Shower chair or bench in shower? Yes  Elevated toilet seat or a handicapped toilet? Yes   TIMED UP AND GO:  Was the test performed? No .  Length of time to ambulate 10 feet: 0 sec.   Gait steady and fast with assistive device  Cognitive Function: MMSE - Mini Mental State Exam 06/16/2016  Orientation to time 5  Orientation to Place 5  Registration 3  Attention/ Calculation 5  Recall 2  Language- name 2 objects 2  Language- repeat 1  Language- follow 3 step command 3  Language- read & follow direction 1  Write a sentence 1  Copy design 1  Total score 29   Montreal Cognitive Assessment  03/17/2017  Visuospatial/ Executive (0/5) 5  Naming (0/3) 2  Attention: Read list of digits (0/2) 2  Attention: Read list of letters (0/1) 1  Attention: Serial 7 subtraction starting at 100 (0/3) 3  Language: Repeat phrase (0/2) 2  Language : Fluency (0/1) 1  Abstraction (0/2) 2  Delayed Recall (0/5) 4  Orientation (0/6) 6  Total 28  Adjusted Score (based on education) 29      Immunizations Immunization History  Administered Date(s) Administered  . Fluad Quad(high Dose 65+) 04/18/2019  . Influenza Split 03/23/2011, 04/07/2013  . Influenza Whole 04/13/2008, 04/29/2009, 03/29/2010, 03/29/2018  . Influenza-Unspecified 04/08/2012, 03/28/2014, 03/14/2015, 03/13/2016, 04/15/2020  . PFIZER(Purple  Top)SARS-COV-2 Vaccination 08/13/2019, 09/05/2019, 04/15/2020  . PPD Test 03/23/2011  . Pneumococcal Conjugate-13 01/10/2014  . Pneumococcal Polysaccharide-23 07/07/2010  . Td 12/27/2001  . Tdap 08/06/2014    TDAP status: Up to date  Flu Vaccine status: Up to date  Pneumococcal vaccine status: Up to date  Covid-19 vaccine status: Completed vaccines  Qualifies for Shingles Vaccine? Yes   Zostavax completed No   Shingrix Completed?: No.    Education has been provided regarding the importance of this vaccine. Patient has been advised to call insurance company to determine out of pocket expense if they have not yet received this vaccine. Advised may also receive vaccine at local pharmacy or Health Dept. Verbalized acceptance and understanding.  Screening Tests Health Maintenance  Topic Date Due  . PNA vac Low Risk Adult (2 of 2 - PPSV23) 07/08/2015  . OPHTHALMOLOGY EXAM  06/27/2020  . COLONOSCOPY (Pts 45-43yrs Insurance coverage will need to be confirmed)  10/02/2020  . HEMOGLOBIN A1C  11/10/2020  . FOOT EXAM  05/13/2021  . MAMMOGRAM  10/19/2021  . TETANUS/TDAP  08/06/2024  . INFLUENZA VACCINE  Completed  . DEXA SCAN  Completed  . COVID-19 Vaccine  Completed  . Hepatitis C Screening  Completed    Health Maintenance  Health Maintenance Due  Topic Date Due  . PNA vac Low Risk Adult (2 of 2 - PPSV23) 07/08/2015  . OPHTHALMOLOGY EXAM  06/27/2020    Colorectal cancer screening: Type of screening: Colonoscopy. Completed 10/03/2015. Repeat every 5 years  Mammogram status: Completed 10/20/2019. Repeat every year  Bone Density  status: Completed 07/04/2018. Results reflect: Bone density results: OSTEOPENIA. Repeat every 1 years.  Lung Cancer Screening: (Low Dose CT Chest recommended if Age 71-80 years, 30 pack-year currently smoking OR have quit w/in 15years.) does not qualify.   Lung Cancer Screening Referral: no  Additional Screening:  Hepatitis C Screening: does qualify;  Completed yes  Vision Screening: Recommended annual ophthalmology exams for early detection of glaucoma and other disorders of the eye. Is the patient up to date with their annual eye exam?  Yes  Who is the provider or what is the name of the office in which the patient attends annual eye exams? Rutherford Guys, MD. If pt is not established with a provider, would they like to be referred to a provider to establish care? No .   Dental Screening: Recommended annual dental exams for proper oral hygiene  Community Resource Referral / Chronic Care Management: CRR required this visit?  No   CCM required this visit?  No      Plan:     I have personally reviewed and noted the following in the patient's chart:   . Medical and social history . Use of alcohol, tobacco or illicit drugs  . Current medications and supplements . Functional ability and status . Nutritional status . Physical activity . Advanced directives . List of other physicians . Hospitalizations, surgeries, and ER visits in previous 12 months . Vitals . Screenings to include cognitive, depression, and falls . Referrals and appointments  In addition, I have reviewed and discussed with patient certain preventive protocols, quality metrics, and best practice recommendations. A written personalized care plan for preventive services as well as general preventive health recommendations were provided to patient.     Sheral Flow, LPN   [id number]/61/0960   Nurse Notes:  Patient is cogitatively intact. There were no vitals filed for this visit. There is no height or weight on file to calculate BMI.    Medical screening examination/treatment/procedure(s) were performed by non-physician practitioner and as supervising provider I was immediately available for consultation/collaboration.  I agree with above. [name], FNP

## 2020-07-17 NOTE — Progress Notes (Signed)
Amy Escobar is a 75 y.o. female with the following history as recorded in EpicCare:  Patient Active Problem List   Diagnosis Date Noted  . Diabetic neuropathy ([place]) 01/10/2020  . Hav (hallux abducto valgus), unspecified laterality 01/10/2020  . Chronic arthropathy 01/10/2020  . 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 edema associated with type 2 diabetes mellitus ([place]) 12/26/2019  . 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 AC (acromioclavicular) arthritis 08/02/2019  . 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 Claudication ([place]) 04/19/2019  . Morbid obesity ([place]) 04/19/2019  . Suspected COVID-19 virus infection 04/13/2019  . Upper airway cough syndrome 08/22/2018  . Greater trochanteric bursitis of both hips 06/15/2018  . Trigger point of shoulder region, left 02/07/2018  . Hypertension   . Mild pulmonary hypertension ([place])   . NSVT (nonsustained ventricular tachycardia) ([place])   . PVC's (premature ventricular contractions)   . URI (upper respiratory infection) 08/09/2016  . Neck pain 02/10/2016  . Urinary incontinence 02/10/2016  . Degenerative arthritis of knee, bilateral 07/17/2015  . Knee MCL sprain 06/07/2015  . Discomfort in chest 02/15/2015  . Chest pain 02/15/2015  . DM type 2, controlled, with complication ([place])   . Wellness examination 08/06/2014  . Acute meniscal tear of knee 02/13/2014  . Primary localized osteoarthrosis, lower leg 02/13/2014  . Gastrocnemius tear 12/25/2013  . UTI (urinary tract infection) 12/20/2013  . Right knee pain 12/20/2013  . Pulmonary hypertension ([place]) 10/06/2013  . DOE (dyspnea on exertion) 08/04/2013  . Dysphagia, unspecified(787.20) 08/10/2012  . Hip pain, left 02/02/2012  . Painful respiration 05/25/2011  . Encounter for long-term (current) use of other medications 01/30/2011  . PELVIC PAIN, LEFT 07/30/2010  . TOBACCO USE, QUIT 07/07/2010  .  FATIGUE 12/20/2009  . Headache 12/20/2009  . Low back pain 12/26/2008  . Disorder of liver 04/13/2008  . FOOT PAIN, BILATERAL 04/13/2008  . FIBROIDS, UTERUS 11/24/2007  . THYROID NODULE, LEFT 11/24/2007  . HYPERCHOLESTEROLEMIA 11/24/2007  . CARPAL TUNNEL SYNDROME, BILATERAL 11/24/2007  . ALKALINE PHOSPHATASE, ELEVATED 11/24/2007  . Gout 08/10/2007  . ANXIETY 08/10/2007  . Essential hypertension 08/10/2007  . Cough 08/01/2007  . Perennial and seasonal allergic rhinitis 01/14/2007  . GERD 01/14/2007  . Osteoporosis 01/14/2007    Current Outpatient Medications  Medication Sig Dispense Refill  . aspirin EC 81 MG tablet Take 81 mg by mouth daily.    . Azelastine-Fluticasone 137-50 MCG/ACT SUSP Place 1 spray into the nose 2 (two) times daily. 23 g 5  . benzonatate (TESSALON) 200 MG capsule TAKE ONE CAPSULE BY MOUTH THREE TIMES A DAY AS NEEDED FOR COUGH 45 capsule 2  . carvedilol (COREG) 6.25 MG tablet Take 1 tablet (6.25 mg total) by mouth 2 (two) times daily. 180 tablet 2  . DULoxetine (CYMBALTA) 30 MG capsule Take 1 capsule (30 mg total) by mouth 2 (two) times daily. 60 capsule 3  . esomeprazole (NEXIUM) 40 MG capsule TAKE ONE CAPSULE BY MOUTH DAILY 30 TO 60 MINUTES BEFORE YOUR FIRST AND LAST MEALS OF THE DAY 180 capsule 3  . fesoterodine (TOVIAZ) 4 MG TB24 tablet Take 4 mg daily by mouth.    Marland Kitchen glucose blood (ONETOUCH VERIO) test strip Use to check blood sugar 2 times a day. 200 each 2  . hydrocortisone 2.5 % lotion     . metFORMIN (GLUCOPHAGE-XR) 500 MG 24 hr tablet TAKE TWO TABLETS BY MOUTH TWICE A DAY 360 tablet 0  . montelukast (SINGULAIR) 10  MG tablet Take 1 tablet (10 mg total) by mouth at bedtime. 30 tablet 11  . Olopatadine HCl 0.2 % SOLN Place 1 drop into both eyes daily. 2.5 mL 12  . Respiratory Therapy Supplies MISC Use flutter valve as needed to break the coughing cycle. 1 each 1  . rosuvastatin (CRESTOR) 5 MG tablet TAKE ONE TABLET BY MOUTH DAILY 90 tablet 1  . valsartan  (DIOVAN) 80 MG tablet TAKE ONE TABLET BY MOUTH DAILY 90 tablet 2  . Vitamin D, Ergocalciferol, (DRISDOL) 1.25 MG (50000 UNIT) CAPS capsule TAKE ONE CAPSULE BY MOUTH ONCE WEEKLY 12 capsule 0  . Carbinoxamine Maleate 4 MG TABS Take 1 tablet (4 mg total) by mouth every 8 (eight) hours as needed. 56 tablet 5  . triamcinolone cream (KENALOG) 0.1 % APPLY 1 APPLICATION TOPICALLY 4 (FOUR) TIMES DAILY AS NEEDED FOR RASH (Patient not taking: Reported on 07/17/2020) 45 g 1  . UNKNOWN TO PATIENT Inject as directed every 6 (six) months. Injection for arthritis in right and left knees (Patient not taking: Reported on 07/17/2020)     No current facility-administered medications for this visit.    Allergies: Olmesartan medoxomil  Past Medical History:  Diagnosis Date  . Alkaline phosphatase deficiency    w/u Ne  . Allergic rhinitis   . Anxiety   . Arthritis   . DM2 (diabetes mellitus, type 2) ([place])   . GERD (gastroesophageal reflux disease)   . Gout   . Hemorrhoids   . Hyperlipidemia   . Hypertension   . Mild pulmonary hypertension ([place])   . NSVT (nonsustained ventricular tachycardia) ([place])   . Osteoporosis   . PVC's (premature ventricular contractions)   . Thyroid nodule    small  . Tubular adenoma of colon 2017  . Urticaria   . Uterine fibroid     Past Surgical History:  Procedure Laterality Date  . CATARACT EXTRACTION Bilateral 2016  . COLONOSCOPY  2007  . echocardiogram (other)  01/16/2002  . 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 stress cardiolite  02/12/2006  . TOENAIL EXCISION    . TUBAL LIGATION    . TYMPANOSTOMY TUBE PLACEMENT      Family History  Problem Relation Age of Onset  . Heart disease Mother   . Allergic rhinitis Mother   . Heart disease Father   . Heart disease Maternal Grandfather   . Rectal cancer Maternal Grandfather   . Stomach cancer Maternal Grandmother   . Colon cancer Neg Hx   . Breast cancer Neg Hx     Social History   Tobacco Use  . Smoking  status: Former Smoker    Packs/day: 0.25    Years: 5.00    Pack years: 1.25    Types: Cigarettes    Quit date: 06/29/1968    Years since quitting: 52.0  . Smokeless tobacco: Never Used  Substance Use Topics  . Alcohol use: No    Alcohol/week: 0.0 standard drinks    Subjective:   I connected with Amy Escobar on 07/17/20 at 12:00 PM EST by a telephone call and verified that I am speaking with the correct person using two identifiers.   I discussed the limitations of evaluation and management by telemedicine and the availability of in person appointments. The patient expressed understanding and agreed to proceed. Provider in office/ patient is at home; provider and patient are only 2 people on telephone call.   Patient is concerned that she is taking too many  medications and that her medications are causing her to be drowsy. She is suspicious that medication written by her pulmonologist are the source; did not address/ discuss with him at recent [place]; Has opted to d/c Neurontin completely;     Objective:  There were no vitals filed for this visit.  Lungs: Respirations unlabored;  Neurologic: Alert and oriented; speech intact; face symmetrical;   Assessment:  1. Other fatigue     Plan:  Extensive medication review done with patient; she will try holding her Singulair initially for the next few days to see if her symptoms improve; if no change, re-start Singulair and try cutting Duloxetine back to once per day; may need to discontinue this medication. She is encouraged to be seen in the office but she defers at this time due to weather concerns. She will follow up at a later date.  Time spent 15 minutes  No follow-ups on file.  No orders of the defined types were placed in this encounter.   Requested Prescriptions    No prescriptions requested or ordered in this encounter

## 2020-07-17 NOTE — Patient Instructions (Signed)
Amy Escobar , Thank you for taking time to come for your Medicare Wellness Visit. I appreciate your ongoing commitment to your health goals. Please review the following plan we discussed and let me know if I can assist you in the future.   Screening recommendations/referrals: Colonoscopy: 10/03/2015; due every 5 years Mammogram: 10/20/2019 Bone Density: 07/04/2018; due every 2 years Recommended yearly ophthalmology/optometry visit for glaucoma screening and checkup Recommended yearly dental visit for hygiene and checkup  Vaccinations: Influenza vaccine: 04/15/2020  Pneumococcal vaccine: up to date Tdap vaccine: 08/06/2014; due every 10 years Shingles vaccine: never done   Covid-19: up to date  Advanced directives: Please bring a copy of your health care power of attorney and living will to the office at your convenience.  Conditions/risks identified: Yes; Reviewed health maintenance screenings with patient today and relevant education, vaccines, and/or referrals were provided. Please continue to do your personal lifestyle choices by: daily care of teeth and gums, regular physical activity (goal should be 5 days a week for 30 minutes), eat a healthy diet, avoid tobacco and drug use, limiting any alcohol intake, taking a low-dose aspirin (if not allergic or have been advised by your provider otherwise) and taking vitamins and minerals as recommended by your provider. Continue doing brain stimulating activities (puzzles, reading, adult coloring books, staying active) to keep memory sharp. Continue to eat heart healthy diet (full of fruits, vegetables, whole grains, lean protein, water--limit salt, fat, and sugar intake) and increase physical activity as tolerated.  Next appointment: Please schedule your next Medicare Wellness Visit with your Nurse Health Advisor in 1 year by calling [phone].   Preventive Care 75 Years and Older, Female Preventive care refers to lifestyle choices and visits with  your health care provider that can promote health and wellness. What does preventive care include?  A yearly physical exam. This is also called an annual well check.  Dental exams once or twice a year.  Routine eye exams. Ask your health care provider how often you should have your eyes checked.  Personal lifestyle choices, including:  Daily care of your teeth and gums.  Regular physical activity.  Eating a healthy diet.  Avoiding tobacco and drug use.  Limiting alcohol use.  Practicing safe sex.  Taking low-dose aspirin every day.  Taking vitamin and mineral supplements as recommended by your health care provider. What happens during an annual well check? The services and screenings done by your health care provider during your annual well check will depend on your age, overall health, lifestyle risk factors, and family history of disease. Counseling  Your health care provider may ask you questions about your:  Alcohol use.  Tobacco use.  Drug use.  Emotional well-being.  Home and relationship well-being.  Sexual activity.  Eating habits.  History of falls.  Memory and ability to understand (cognition).  Work and work Statistician.  Reproductive health. Screening  You may have the following tests or measurements:  Height, weight, and BMI.  Blood pressure.  Lipid and cholesterol levels. These may be checked every 5 years, or more frequently if you are over 19 years old.  Skin check.  Lung cancer screening. You may have this screening every year starting at age 29 if you have a 30-pack-year history of smoking and currently smoke or have quit within the past 15 years.  Fecal occult blood test (FOBT) of the stool. You may have this test every year starting at age 45.  Flexible sigmoidoscopy or colonoscopy. You may  have a sigmoidoscopy every 5 years or a colonoscopy every 10 years starting at age 53.  Hepatitis C blood test.  Hepatitis B blood  test.  Sexually transmitted disease (STD) testing.  Diabetes screening. This is done by checking your blood sugar (glucose) after you have not eaten for a while (fasting). You may have this done every 1-3 years.  Bone density scan. This is done to screen for osteoporosis. You may have this done starting at age 36.  Mammogram. This may be done every 1-2 years. Talk to your health care provider about how often you should have regular mammograms. Talk with your health care provider about your test results, treatment options, and if necessary, the need for more tests. Vaccines  Your health care provider may recommend certain vaccines, such as:  Influenza vaccine. This is recommended every year.  Tetanus, diphtheria, and acellular pertussis (Tdap, Td) vaccine. You may need a Td booster every 10 years.  Zoster vaccine. You may need this after age 83.  Pneumococcal 13-valent conjugate (PCV13) vaccine. One dose is recommended after age 75.  Pneumococcal polysaccharide (PPSV23) vaccine. One dose is recommended after age 42. Talk to your health care provider about which screenings and vaccines you need and how often you need them. This information is not intended to replace advice given to you by your health care provider. Make sure you discuss any questions you have with your health care provider. Document Released: 07/12/2015 Document Revised: 03/04/2016 Document Reviewed: 04/16/2015 Elsevier Interactive Patient Education  2017 [place] Prevention in the Home Falls can cause injuries. They can happen to people of all ages. There are many things you can do to make your home safe and to help prevent falls. What can I do on the outside of my home?  Regularly fix the edges of walkways and driveways and fix any cracks.  Remove anything that might make you trip as you walk through a door, such as a raised step or threshold.  Trim any bushes or trees on the path to your home.  Use  bright outdoor lighting.  Clear any walking paths of anything that might make someone trip, such as rocks or tools.  Regularly check to see if handrails are loose or broken. Make sure that both sides of any steps have handrails.  Any raised decks and porches should have guardrails on the edges.  Have any leaves, snow, or ice cleared regularly.  Use sand or salt on walking paths during winter.  Clean up any spills in your garage right away. This includes oil or grease spills. What can I do in the bathroom?  Use night lights.  Install grab bars by the toilet and in the tub and shower. Do not use towel bars as grab bars.  Use non-skid mats or decals in the tub or shower.  If you need to sit down in the shower, use a plastic, non-slip stool.  Keep the floor dry. Clean up any water that spills on the floor as soon as it happens.  Remove soap buildup in the tub or shower regularly.  Attach bath mats securely with double-sided non-slip rug tape.  Do not have throw rugs and other things on the floor that can make you trip. What can I do in the bedroom?  Use night lights.  Make sure that you have a light by your bed that is easy to reach.  Do not use any sheets or blankets that are too big for your  bed. They should not hang down onto the floor.  Have a firm chair that has side arms. You can use this for support while you get dressed.  Do not have throw rugs and other things on the floor that can make you trip. What can I do in the kitchen?  Clean up any spills right away.  Avoid walking on wet floors.  Keep items that you use a lot in easy-to-reach places.  If you need to reach something above you, use a strong step stool that has a grab bar.  Keep electrical cords out of the way.  Do not use floor polish or wax that makes floors slippery. If you must use wax, use non-skid floor wax.  Do not have throw rugs and other things on the floor that can make you trip. What can  I do with my stairs?  Do not leave any items on the stairs.  Make sure that there are handrails on both sides of the stairs and use them. Fix handrails that are broken or loose. Make sure that handrails are as long as the stairways.  Check any carpeting to make sure that it is firmly attached to the stairs. Fix any carpet that is loose or worn.  Avoid having throw rugs at the top or bottom of the stairs. If you do have throw rugs, attach them to the floor with carpet tape.  Make sure that you have a light switch at the top of the stairs and the bottom of the stairs. If you do not have them, ask someone to add them for you. What else can I do to help prevent falls?  Wear shoes that:  Do not have high heels.  Have rubber bottoms.  Are comfortable and fit you well.  Are closed at the toe. Do not wear sandals.  If you use a stepladder:  Make sure that it is fully opened. Do not climb a closed stepladder.  Make sure that both sides of the stepladder are locked into place.  Ask someone to hold it for you, if possible.  Clearly mark and make sure that you can see:  Any grab bars or handrails.  First and last steps.  Where the edge of each step is.  Use tools that help you move around (mobility aids) if they are needed. These include:  Canes.  Walkers.  Scooters.  Crutches.  Turn on the lights when you go into a dark area. Replace any light bulbs as soon as they burn out.  Set up your furniture so you have a clear path. Avoid moving your furniture around.  If any of your floors are uneven, fix them.  If there are any pets around you, be aware of where they are.  Review your medicines with your doctor. Some medicines can make you feel dizzy. This can increase your chance of falling. Ask your doctor what other things that you can do to help prevent falls. This information is not intended to replace advice given to you by your health care provider. Make sure you  discuss any questions you have with your health care provider. Document Released: 04/11/2009 Document Revised: 11/21/2015 Document Reviewed: 07/20/2014 Elsevier Interactive Patient Education  2017 [employer].

## 2020-07-26 ENCOUNTER — Other Ambulatory Visit: Payer: Self-pay

## 2020-07-26 ENCOUNTER — Ambulatory Visit (INDEPENDENT_AMBULATORY_CARE_PROVIDER_SITE_OTHER): Payer: Medicare Other | Admitting: Family

## 2020-07-26 ENCOUNTER — Encounter: Payer: Self-pay | Admitting: Family

## 2020-07-26 VITALS — BP 128/80 | HR 96 | Wt 164.0 lb

## 2020-07-26 DIAGNOSIS — R5383 Other fatigue: Secondary | ICD-10-CM

## 2020-07-26 DIAGNOSIS — R42 Dizziness and giddiness: Secondary | ICD-10-CM

## 2020-07-26 DIAGNOSIS — E118 Type 2 diabetes mellitus with unspecified complications: Secondary | ICD-10-CM

## 2020-07-26 LAB — CBC WITH DIFFERENTIAL/PLATELET
Basophils Absolute: 0.1 10*3/uL (ref 0.0–0.1)
Basophils Relative: 0.7 % (ref 0.0–3.0)
Eosinophils Absolute: 0.2 10*3/uL (ref 0.0–0.7)
Eosinophils Relative: 2.8 % (ref 0.0–5.0)
HCT: 39 % (ref 36.0–46.0)
Hemoglobin: 12.7 g/dL (ref 12.0–15.0)
Lymphocytes Relative: 44.6 % (ref 12.0–46.0)
Lymphs Abs: 3.4 10*3/uL (ref 0.7–4.0)
MCHC: 32.6 g/dL (ref 30.0–36.0)
MCV: 84.3 fl (ref 78.0–100.0)
Monocytes Absolute: 0.8 10*3/uL (ref 0.1–1.0)
Monocytes Relative: 10.6 % (ref 3.0–12.0)
Neutro Abs: 3.1 10*3/uL (ref 1.4–7.7)
Neutrophils Relative %: 41.3 % — ABNORMAL LOW (ref 43.0–77.0)
Platelets: 223 10*3/uL (ref 150.0–400.0)
RBC: 4.62 Mil/uL (ref 3.87–5.11)
RDW: 14.4 % (ref 11.5–15.5)
WBC: 7.6 10*3/uL (ref 4.0–10.5)

## 2020-07-26 LAB — COMPREHENSIVE METABOLIC PANEL
ALT: 16 U/L (ref 0–35)
AST: 19 U/L (ref 0–37)
Albumin: 3.9 g/dL (ref 3.5–5.2)
Alkaline Phosphatase: 110 U/L (ref 39–117)
BUN: 11 mg/dL (ref 6–23)
CO2: 30 mEq/L (ref 19–32)
Calcium: 9.5 mg/dL (ref 8.4–10.5)
Chloride: 102 mEq/L (ref 96–112)
Creatinine, Ser: 0.83 mg/dL (ref 0.40–1.20)
GFR: 69.16 mL/min (ref >60.00)
Glucose, Bld: 101 mg/dL — ABNORMAL HIGH (ref 70–99)
Potassium: 4 mEq/L (ref 3.5–5.1)
Sodium: 138 mEq/L (ref 135–145)
Total Bilirubin: 0.3 mg/dL (ref 0.2–1.2)
Total Protein: 7.2 g/dL (ref 6.0–8.3)

## 2020-07-26 LAB — TSH: TSH: 1.99 u[IU]/mL (ref 0.35–4.50)

## 2020-07-26 LAB — VITAMIN B12: Vitamin B-12: 376 pg/mL (ref 211–911)

## 2020-07-26 LAB — HEMOGLOBIN A1C: Hgb A1c MFr Bld: 6.2 % (ref 4.6–6.5)

## 2020-07-26 NOTE — Progress Notes (Signed)
Amy Escobar is a 75 y.o. female with the following history as recorded in EpicCare:  Patient Active Problem List   Diagnosis Date Noted  . Diabetic neuropathy ([place]) 01/10/2020  . Hav (hallux abducto valgus), unspecified laterality 01/10/2020  . Chronic arthropathy 01/10/2020  . 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 edema associated with type 2 diabetes mellitus ([place]) 12/26/2019  . 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 AC (acromioclavicular) arthritis 08/02/2019  . 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 Claudication ([place]) 04/19/2019  . Morbid obesity ([place]) 04/19/2019  . Suspected COVID-19 virus infection 04/13/2019  . Upper airway cough syndrome 08/22/2018  . Greater trochanteric bursitis of both hips 06/15/2018  . Trigger point of shoulder region, left 02/07/2018  . Hypertension   . Mild pulmonary hypertension ([place])   . NSVT (nonsustained ventricular tachycardia) ([place])   . PVC's (premature ventricular contractions)   . URI (upper respiratory infection) 08/09/2016  . Neck pain 02/10/2016  . Urinary incontinence 02/10/2016  . Degenerative arthritis of knee, bilateral 07/17/2015  . Knee MCL sprain 06/07/2015  . Discomfort in chest 02/15/2015  . Chest pain 02/15/2015  . DM type 2, controlled, with complication ([place])   . Wellness examination 08/06/2014  . Acute meniscal tear of knee 02/13/2014  . Primary localized osteoarthrosis, lower leg 02/13/2014  . Gastrocnemius tear 12/25/2013  . UTI (urinary tract infection) 12/20/2013  . Right knee pain 12/20/2013  . Pulmonary hypertension ([place]) 10/06/2013  . DOE (dyspnea on exertion) 08/04/2013  . Dysphagia, unspecified(787.20) 08/10/2012  . Hip pain, left 02/02/2012  . Painful respiration 05/25/2011  . Encounter for long-term (current) use of other medications 01/30/2011  . PELVIC PAIN, LEFT 07/30/2010  . TOBACCO USE, QUIT 07/07/2010  .  FATIGUE 12/20/2009  . Headache 12/20/2009  . Low back pain 12/26/2008  . Disorder of liver 04/13/2008  . FOOT PAIN, BILATERAL 04/13/2008  . FIBROIDS, UTERUS 11/24/2007  . THYROID NODULE, LEFT 11/24/2007  . HYPERCHOLESTEROLEMIA 11/24/2007  . CARPAL TUNNEL SYNDROME, BILATERAL 11/24/2007  . ALKALINE PHOSPHATASE, ELEVATED 11/24/2007  . Gout 08/10/2007  . ANXIETY 08/10/2007  . Essential hypertension 08/10/2007  . Cough 08/01/2007  . Perennial and seasonal allergic rhinitis 01/14/2007  . GERD 01/14/2007  . Osteoporosis 01/14/2007    Current Outpatient Medications  Medication Sig Dispense Refill  . carvedilol (COREG) 6.25 MG tablet Take 1 tablet (6.25 mg total) by mouth 2 (two) times daily. 180 tablet 2  . Chlorpheniramine-Acetaminophen (CORICIDIN HBP COLD/FLU PO) Take by mouth.    . DULoxetine (CYMBALTA) 30 MG capsule Take 1 capsule (30 mg total) by mouth 2 (two) times daily. (Patient taking differently: Take 30 mg by mouth daily.) 60 capsule 3  . esomeprazole (NEXIUM) 40 MG capsule TAKE ONE CAPSULE BY MOUTH DAILY 30 TO 60 MINUTES BEFORE YOUR FIRST AND LAST MEALS OF THE DAY 180 capsule 3  . fesoterodine (TOVIAZ) 4 MG TB24 tablet Take 4 mg daily by mouth.    Marland Kitchen glucose blood (ONETOUCH VERIO) test strip Use to check blood sugar 2 times a day. 200 each 2  . guaiFENesin (MUCINEX) 600 MG 12 hr tablet Take 600 mg by mouth 2 (two) times daily as needed.    . metFORMIN (GLUCOPHAGE-XR) 500 MG 24 hr tablet TAKE TWO TABLETS BY MOUTH TWICE A DAY 360 tablet 0  . rosuvastatin (CRESTOR) 5 MG tablet TAKE ONE TABLET BY MOUTH DAILY 90 tablet 1  . valsartan (DIOVAN) 80 MG tablet TAKE ONE TABLET BY MOUTH  DAILY 90 tablet 2  . Olopatadine HCl 0.2 % SOLN Place 1 drop into both eyes daily. 2.5 mL 12  . Respiratory Therapy Supplies MISC Use flutter valve as needed to break the coughing cycle. (Patient not taking: Reported on 07/26/2020) 1 each 1   No current facility-administered medications for this visit.     Allergies: Olmesartan medoxomil  Past Medical History:  Diagnosis Date  . Alkaline phosphatase deficiency    w/u Ne  . Allergic rhinitis   . Anxiety   . Arthritis   . DM2 (diabetes mellitus, type 2) ([place])   . GERD (gastroesophageal reflux disease)   . Gout   . Hemorrhoids   . Hyperlipidemia   . Hypertension   . Mild pulmonary hypertension ([place])   . NSVT (nonsustained ventricular tachycardia) ([place])   . Osteoporosis   . PVC's (premature ventricular contractions)   . Thyroid nodule    small  . Tubular adenoma of colon 2017  . Urticaria   . Uterine fibroid     Past Surgical History:  Procedure Laterality Date  . CATARACT EXTRACTION Bilateral 2016  . COLONOSCOPY  2007  . echocardiogram (other)  01/16/2002  . 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 stress cardiolite  02/12/2006  . TOENAIL EXCISION    . TUBAL LIGATION    . TYMPANOSTOMY TUBE PLACEMENT      Family History  Problem Relation Age of Onset  . Heart disease Mother   . Allergic rhinitis Mother   . Heart disease Father   . Heart disease Maternal Grandfather   . Rectal cancer Maternal Grandfather   . Stomach cancer Maternal Grandmother   . Colon cancer Neg Hx   . Breast cancer Neg Hx     Social History   Tobacco Use  . Smoking status: Former Smoker    Packs/day: 0.25    Years: 5.00    Pack years: 1.25    Types: Cigarettes    Quit date: 06/29/1968    Years since quitting: 52.1  . Smokeless tobacco: Never Used  Substance Use Topics  . Alcohol use: No    Alcohol/week: 0.0 standard drinks    Subjective:   Follow up from virtual visit on 07/17/20; since that visit, patient has decreased dosage of Cymbalta to once per day and stopped Singulair; notes she is feeling better but is continuing to have some headaches/ occasional dizziness; wanted to get her labs updated as discussed in the last virtual visit.     Objective:  Vitals:   07/26/20 1549  BP: 128/80  Pulse: 96  SpO2: 96%  Weight: 164 lb (74.4  kg)    General: Well developed, well nourished, in no acute distress  Skin : Warm and dry.  Head: Normocephalic and atraumatic  Eyes: Sclera and conjunctiva clear; pupils round and reactive to light; extraocular movements intact  Ears: External normal; canals clear; tympanic membranes normal  Oropharynx: Pink, supple. No suspicious lesions  Neck: Supple without thyromegaly, adenopathy; no carotid bruits noted  Lungs: Respirations unlabored; clear to auscultation bilaterally without wheeze, rales, rhonchi  CVS exam: normal rate and regular rhythm.  Neurologic: Alert and oriented; speech intact; face symmetrical; moves all extremities well; CNII-XII intact without focal deficit   Assessment:  1. Dizziness   2. Other fatigue   3. DM type 2, controlled, with complication ([place])     Plan:  Symptoms improved with changing some of her medications; will check CT, carotid dopplers; keep planned follow-up with her  cardiologist;  Extensive medication review done again with patient today and medication list is up to date per patient preference.  Time spent 30 minutes  This visit occurred during the SARS-CoV-2 public health emergency.  Safety protocols were in place, including screening questions prior to the visit, additional usage of staff PPE, and extensive cleaning of exam room while observing appropriate contact time as indicated for disinfecting solutions.     No follow-ups on file.  Orders Placed This Encounter  Procedures  . CT Head Wo Contrast    Standing Status:   Future    Standing Expiration Date:   07/26/2021    Order Specific Question:   Preferred imaging location?    Answer:   [id number]  . CBC with Differential/Platelet    Standing Status:   Future    Number of Occurrences:   1    Standing Expiration Date:   07/26/2021  . Comp Met (CMET)    Standing Status:   Future    Number of Occurrences:   1    Standing Expiration Date:   07/26/2021  . Hemoglobin A1c     Standing Status:   Future    Number of Occurrences:   1    Standing Expiration Date:   07/26/2021  . TSH    Standing Status:   Future    Number of Occurrences:   1    Standing Expiration Date:   07/26/2021  . Vitamin B12    Standing Status:   Future    Number of Occurrences:   1    Standing Expiration Date:   07/26/2021    Requested Prescriptions    No prescriptions requested or ordered in this encounter

## 2020-08-07 ENCOUNTER — Other Ambulatory Visit: Payer: Self-pay

## 2020-08-07 ENCOUNTER — Ambulatory Visit (HOSPITAL_COMMUNITY)
Admission: RE | Admit: 2020-08-07 | Discharge: 2020-08-07 | Disposition: A | Discharge status: Home / Self Care | Payer: Medicare Other | Source: Ambulatory Visit | Attending: Cardiovascular Disease | Admitting: Cardiovascular Disease

## 2020-08-07 DIAGNOSIS — R42 Dizziness and giddiness: Secondary | ICD-10-CM | POA: Insufficient documentation

## 2020-08-08 ENCOUNTER — Ambulatory Visit
Admission: RE | Admit: 2020-08-08 | Discharge: 2020-08-08 | Disposition: A | Discharge status: Home / Self Care | Payer: Medicare Other | Source: Ambulatory Visit | Attending: Family | Admitting: Family

## 2020-08-08 DIAGNOSIS — R519 Headache, unspecified: Secondary | ICD-10-CM | POA: Diagnosis not present

## 2020-08-08 DIAGNOSIS — R42 Dizziness and giddiness: Secondary | ICD-10-CM

## 2020-08-09 ENCOUNTER — Other Ambulatory Visit: Payer: Self-pay | Admitting: Family

## 2020-08-09 DIAGNOSIS — R930 Abnormal findings on diagnostic imaging of skull and head, not elsewhere classified: Secondary | ICD-10-CM

## 2020-08-09 DIAGNOSIS — H9313 Tinnitus, bilateral: Secondary | ICD-10-CM | POA: Diagnosis not present

## 2020-08-11 ENCOUNTER — Other Ambulatory Visit: Payer: Self-pay | Admitting: Cardiology

## 2020-08-16 ENCOUNTER — Other Ambulatory Visit: Payer: Self-pay

## 2020-08-16 ENCOUNTER — Other Ambulatory Visit: Payer: Medicare Other | Admitting: *Deleted

## 2020-08-16 DIAGNOSIS — I10 Essential (primary) hypertension: Secondary | ICD-10-CM

## 2020-08-16 DIAGNOSIS — E785 Hyperlipidemia, unspecified: Secondary | ICD-10-CM

## 2020-08-16 DIAGNOSIS — I2510 Atherosclerotic heart disease of native coronary artery without angina pectoris: Secondary | ICD-10-CM

## 2020-08-16 DIAGNOSIS — R7989 Other specified abnormal findings of blood chemistry: Secondary | ICD-10-CM | POA: Diagnosis not present

## 2020-08-16 LAB — COMPREHENSIVE METABOLIC PANEL
ALT: 18 IU/L (ref 0–32)
AST: 26 IU/L (ref 0–40)
Albumin/Globulin Ratio: 1.3 (ref 1.2–2.2)
Albumin: 3.9 g/dL (ref 3.7–4.7)
Alkaline Phosphatase: 116 IU/L (ref 44–121)
BUN/Creatinine Ratio: 11 — ABNORMAL LOW (ref 12–28)
BUN: 7 mg/dL — ABNORMAL LOW (ref 8–27)
Bilirubin Total: 0.4 mg/dL (ref 0.0–1.2)
CO2: 23 mmol/L (ref 20–29)
Calcium: 9.3 mg/dL (ref 8.7–10.3)
Chloride: 103 mmol/L (ref 96–106)
Creatinine, Ser: 0.66 mg/dL (ref 0.57–1.00)
GFR calc Af Amer: 100 mL/min/{1.73_m2} (ref >59)
GFR calc non Af Amer: 87 mL/min/{1.73_m2} (ref >59)
Globulin, Total: 3.1 g/dL (ref 1.5–4.5)
Glucose: 111 mg/dL — ABNORMAL HIGH (ref 65–99)
Potassium: 4 mmol/L (ref 3.5–5.2)
Sodium: 142 mmol/L (ref 134–144)
Total Protein: 7 g/dL (ref 6.0–8.5)

## 2020-08-21 NOTE — Progress Notes (Signed)
[place] Phone: [phone] Subjective:   Amy Escobar, am serving as a scribe for Dr. Hulan Saas. This visit occurred during the SARS-CoV-2 public health emergency.  Safety protocols were in place, including screening questions prior to the visit, additional usage of staff PPE, and extensive cleaning of exam room while observing appropriate contact time as indicated for disinfecting solutions.   I'm seeing this patient by the request  of:  [name], FNP  CC: Bilateral knee pain follow-up  [id number]   06/18/2020 Chronic problem with exacerbation.  Discussed icing regimen and home exercises which activities to do which wants to avoid.  Increase activity slowly.  Follow-up again 8 to 12 weeks.  Update 08/27/2020 Amy Escobar is a 75 y.o. female coming in with complaint of bilateral knee pain. Durolane given last visit. Patient states that her pain has been increasing over past month. Left knee pain worse than right and is on medial aspect.  Patient states worsening pain again at this time.  Starting to affect daily activities.  Feels like she is having swelling.  Left is actually worse than the right at the moment.  Feels the last injections 10 weeks ago were not helpful and was more of the viscosupplementation.       Past Medical History:  Diagnosis Date  . Alkaline phosphatase deficiency    w/u Ne  . Allergic rhinitis   . Anxiety   . Arthritis   . DM2 (diabetes mellitus, type 2) ([place])   . GERD (gastroesophageal reflux disease)   . Gout   . Hemorrhoids   . Hyperlipidemia   . Hypertension   . Mild pulmonary hypertension ([place])   . NSVT (nonsustained ventricular tachycardia) ([place])   . Osteoporosis   . PVC's (premature ventricular contractions)   . Thyroid nodule    small  . Tubular adenoma of colon 2017  . Urticaria   . Uterine fibroid    Past Surgical History:  Procedure  Laterality Date  . CATARACT EXTRACTION Bilateral 2016  . COLONOSCOPY  2007  . echocardiogram (other)  01/16/2002  . 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 stress cardiolite  02/12/2006  . TOENAIL EXCISION    . TUBAL LIGATION    . TYMPANOSTOMY TUBE PLACEMENT     Social History   Socioeconomic History  . Marital status: Widowed    Spouse name: Not on file  . Number of children: 2  . Years of education: Not on file  . Highest education level: Not on file  Occupational History  . Occupation: Surveyor, minerals: UNEMPLOYED  Tobacco Use  . Smoking status: Former Smoker    Packs/day: 0.25    Years: 5.00    Pack years: 1.25    Types: Cigarettes    Quit date: 06/29/1968    Years since quitting: 52.1  . Smokeless tobacco: Never Used  Vaping Use  . Vaping Use: Never used  Substance and Sexual Activity  . Alcohol use: Escobar    Alcohol/week: 0.0 standard drinks  . Drug use: Escobar  . Sexual activity: Not on file  Other Topics Concern  . Not on file  Social History Narrative   Widowed 2010.    Social Determinants of Health   Financial Resource Strain: Low Risk   . Difficulty of Paying Living Expenses: Not hard at all  Food Insecurity: Escobar Food Insecurity  . Worried About Running  Out of Food in the Last Year: Never true  . Ran Out of Food in the Last Year: Never true  Transportation Needs: Escobar Transportation Needs  . Lack of Transportation (Medical): Escobar  . Lack of Transportation (Non-Medical): Escobar  Physical Activity: Sufficiently Active  . Days of Exercise per Week: 5 days  . Minutes of Exercise per Session: 30 min  Stress: Escobar Stress Concern Present  . Feeling of Stress : Not at all  Social Connections: Moderately Integrated  . Frequency of Communication with Friends and Family: More than three times a week  . Frequency of Social Gatherings with Friends and Family: Once a week  . Attends Religious Services: More than 4 times per year  . Active Member of Clubs or Organizations:  Escobar  . Attends Archivist Meetings: More than 4 times per year  . Marital Status: Widowed   Allergies  Allergen Reactions  . Olmesartan Medoxomil     REACTION: headache   Family History  Problem Relation Age of Onset  . Heart disease Mother   . Allergic rhinitis Mother   . Heart disease Father   . Heart disease Maternal Grandfather   . Rectal cancer Maternal Grandfather   . Stomach cancer Maternal Grandmother   . Colon cancer Neg Hx   . Breast cancer Neg Hx     Current Outpatient Medications (Endocrine & Metabolic):  .  metFORMIN (GLUCOPHAGE-XR) 500 MG 24 hr tablet, TAKE TWO TABLETS BY MOUTH TWICE A DAY  Current Outpatient Medications (Cardiovascular):  .  carvedilol (COREG) 12.5 MG tablet, Take 1 tablet (12.5 mg total) by mouth 2 (two) times daily with a meal. .  rosuvastatin (CRESTOR) 5 MG tablet, TAKE ONE TABLET BY MOUTH DAILY .  valsartan (DIOVAN) 160 MG tablet, Take 1 tablet (160 mg total) by mouth daily.  Current Outpatient Medications (Respiratory):  .  benzonatate (TESSALON) 100 MG capsule, Take by mouth as needed for cough. .  Chlorpheniramine-Acetaminophen (CORICIDIN HBP COLD/FLU PO), Take by mouth. Marland Kitchen  guaiFENesin (MUCINEX) 600 MG 12 hr tablet, Take 600 mg by mouth 2 (two) times daily as needed.  Current Outpatient Medications (Analgesics):  .  aspirin EC 81 MG tablet, Take 81 mg by mouth daily. Swallow whole.  Current Outpatient Medications (Hematological):  Marland Kitchen  Cyanocobalamin (B-12 PO), Take by mouth daily.  Current Outpatient Medications (Other):  .  calcium elemental as carbonate (ANTACID MAXIMUM) 400 MG chewable tablet, Chew 1,000 mg by mouth as needed for heartburn. .  DULoxetine (CYMBALTA) 30 MG capsule, Take 1 capsule (30 mg total) by mouth 2 (two) times daily. Marland Kitchen  esomeprazole (NEXIUM) 40 MG capsule, TAKE ONE CAPSULE BY MOUTH DAILY 30 TO 60 MINUTES BEFORE YOUR FIRST AND LAST MEALS OF THE DAY .  fesoterodine (TOVIAZ) 4 MG TB24 tablet, Take 4 mg  daily by mouth. Marland Kitchen  glucose blood (ONETOUCH VERIO) test strip, Use to check blood sugar 2 times a day. .  Olopatadine HCl 0.2 % SOLN, Place 1 drop into both eyes daily. Marland Kitchen  Respiratory Therapy Supplies MISC, Use flutter valve as needed to break the coughing cycle. .  Vitamin D, Ergocalciferol, (DRISDOL) 1.25 MG (50000 UNIT) CAPS capsule, TAKE ONE CAPSULE BY MOUTH ONCE WEEKLY   Reviewed prior external information including notes and imaging from  primary care provider As well as notes that were available from care everywhere and other healthcare systems.  Past medical history, social, surgical and family history all reviewed in electronic medical record.  Escobar pertanent  information unless stated regarding to the chief complaint.   Review of Systems:  Escobar headache, visual changes, nausea, vomiting, diarrhea, constipation, dizziness, abdominal pain, skin rash, fevers, chills, night sweats, weight loss, swollen lymph nodes, body aches, joint swelling, chest pain, shortness of breath, mood changes. POSITIVE muscle aches  Objective  Blood pressure 122/88, pulse 90, height 5' 4.5" (1.638 m), weight 162 lb (73.5 kg), SpO2 98 %.   General: Escobar apparent distress alert and oriented x3 mood and affect normal, dressed appropriately.  HEENT: Pupils equal, extraocular movements intact  Respiratory: Patient's speak in full sentences and does not appear short of breath  Cardiovascular: Escobar lower extremity edema, non tender, Escobar erythema  Gait mild antalgic gait MSK:   Knee: Bilateral valgus deformity noted.  Abnormal thigh to calf ratio.  Tender to palpation over medial and PF joint line.  ROM lacks 5 degrees of extension bilaterally in the last 10 degrees of flexion on the left instability with valgus force.  painful patellar compression. Patellar glide with moderate crepitus. Patellar and quadriceps tendons unremarkable. Hamstring and quadriceps strength is normal.  After informed written and verbal  consent, patient was seated on exam table. Right knee was prepped with alcohol swab and utilizing anterolateral approach, patient's right knee space was injected with 4:1  marcaine 0.5%: Kenalog 40mg /dL. Patient tolerated the procedure well without immediate complications.  After informed written and verbal consent, patient was seated on exam table. Left knee was prepped with alcohol swab and utilizing anterolateral approach, patient's left knee space was injected with 4:1  marcaine 0.5%: Kenalog 40mg /dL. Patient tolerated the procedure well without immediate complications.    Impression and Recommendations:     The above documentation has been reviewed and is accurate and complete Lyndal Pulley, DO

## 2020-08-26 ENCOUNTER — Encounter: Payer: Self-pay | Admitting: Cardiology

## 2020-08-26 ENCOUNTER — Other Ambulatory Visit: Payer: Self-pay

## 2020-08-26 ENCOUNTER — Ambulatory Visit (INDEPENDENT_AMBULATORY_CARE_PROVIDER_SITE_OTHER): Payer: Medicare Other | Admitting: Cardiology

## 2020-08-26 VITALS — BP 130/92 | HR 90 | Ht 64.5 in | Wt 160.8 lb

## 2020-08-26 DIAGNOSIS — E782 Mixed hyperlipidemia: Secondary | ICD-10-CM

## 2020-08-26 DIAGNOSIS — I10 Essential (primary) hypertension: Secondary | ICD-10-CM

## 2020-08-26 DIAGNOSIS — I2510 Atherosclerotic heart disease of native coronary artery without angina pectoris: Secondary | ICD-10-CM | POA: Diagnosis not present

## 2020-08-26 MED ORDER — VALSARTAN 160 MG PO TABS: 160.0000 mg | ORAL_TABLET | Freq: Every day | ORAL | 2 refills | Status: DC

## 2020-08-26 MED ORDER — CARVEDILOL 12.5 MG PO TABS: 12.5000 mg | ORAL_TABLET | Freq: Two times a day (BID) | ORAL | 2 refills | Status: DC

## 2020-08-26 NOTE — Progress Notes (Signed)
Cardiology Office Note    Date:  08/26/2020   ID:  Amy, Escobar [birth year], MRN [id number]  PCP:  Amy Escobar, [place]  Cardiologist: Amy Dawley, MD EPS: None  Chief complain: Hypertension  History of Present Illness:  Amy Escobar is a 75 y.o. female with history of hypertension, hyperlipidemia, IDDM, moderate pulmonary hypertension, moderate nonobstructive CAD, coronary CTA 2018 calcium score 220 which is 81st percentile for age and sex match control, moderate plaque in the ostial left main and mild plaque in the ostial RCA.  Patient saw Dr. Meda Escobar 02/23/2019 complaining of dyspnea on exertion and some chest pain.  Nuclear stress test was ordered.  Also 2D echo because of moderate pulmonary hypertension.  2D echo 03/10/2019 normal LVEF 55 to 60% borderline enlarged pulmonary artery, normal right ventricular systolic pressure.  NST was normal.  The patient is coming after 6 months, she has been doing great, she previously had lower extremity pain but eventually underwent epidural injections that helped with her pain.  She denies any chest pain.  She remains active.  She has noticed that her blood pressure is from 130s to 150s.  No dyspnea on exertion, lower extremity edema orthopnea proximal nocturnal dyspnea.  She has been compliant with her meds and has no side effects.  Past Medical History:  Diagnosis Date  . Alkaline phosphatase deficiency    w/u Ne  . Allergic rhinitis   . Anxiety   . Arthritis   . DM2 (diabetes mellitus, type 2) ([place])   . GERD (gastroesophageal reflux disease)   . Gout   . Hemorrhoids   . Hyperlipidemia   . Hypertension   . Mild pulmonary hypertension ([place])   . NSVT (nonsustained ventricular tachycardia) ([place])   . Osteoporosis   . PVC's (premature ventricular contractions)   . Thyroid nodule    small  . Tubular adenoma of colon 2017  . Urticaria   . Uterine fibroid     Past Surgical History:  Procedure Laterality Date  .  CATARACT EXTRACTION Bilateral 2016  . COLONOSCOPY  2007  . echocardiogram (other)  01/16/2002  . 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 stress cardiolite  02/12/2006  . TOENAIL EXCISION    . TUBAL LIGATION    . TYMPANOSTOMY TUBE PLACEMENT      Current Medications: Current Meds  Medication Sig  . aspirin EC 81 MG tablet Take 81 mg by mouth daily. Swallow whole.  . benzonatate (TESSALON) 100 MG capsule Take by mouth as needed for cough.  . calcium elemental as carbonate (ANTACID MAXIMUM) 400 MG chewable tablet Chew 1,000 mg by mouth as needed for heartburn.  . carvedilol (COREG) 12.5 MG tablet Take 1 tablet (12.5 mg total) by mouth 2 (two) times daily with a meal.  . Chlorpheniramine-Acetaminophen (CORICIDIN HBP COLD/FLU PO) Take by mouth.  . Cyanocobalamin (B-12 PO) Take by mouth daily.  . DULoxetine (CYMBALTA) 30 MG capsule Take 1 capsule (30 mg total) by mouth 2 (two) times daily.  . ergocalciferol (VITAMIN D2) 1.25 MG (50000 UT) capsule Take 50,000 Units by mouth once a week.  . esomeprazole (NEXIUM) 40 MG capsule TAKE ONE CAPSULE BY MOUTH DAILY 30 TO 60 MINUTES BEFORE YOUR FIRST AND LAST MEALS OF THE DAY  . fesoterodine (TOVIAZ) 4 MG TB24 tablet Take 4 mg daily by mouth.  Marland Kitchen glucose blood (ONETOUCH VERIO) test strip Use to check blood sugar 2 times a day.  Marland Kitchen guaiFENesin (MUCINEX) 600 MG 12  hr tablet Take 600 mg by mouth 2 (two) times daily as needed.  . metFORMIN (GLUCOPHAGE-XR) 500 MG 24 hr tablet TAKE TWO TABLETS BY MOUTH TWICE A DAY  . Olopatadine HCl 0.2 % SOLN Place 1 drop into both eyes daily.  Marland Kitchen Respiratory Therapy Supplies MISC Use flutter valve as needed to break the coughing cycle.  . rosuvastatin (CRESTOR) 5 MG tablet TAKE ONE TABLET BY MOUTH DAILY  . valsartan (DIOVAN) 160 MG tablet Take 1 tablet (160 mg total) by mouth daily.  . [DISCONTINUED] carvedilol (COREG) 6.25 MG tablet Take 1 tablet (6.25 mg total) by mouth 2 (two) times daily.  . [DISCONTINUED] valsartan  (DIOVAN) 80 MG tablet TAKE ONE TABLET BY MOUTH DAILY     Allergies:   Olmesartan medoxomil   Social History   Socioeconomic History  . Marital status: Widowed    Spouse name: Not on file  . Number of children: 2  . Years of education: Not on file  . Highest education level: Not on file  Occupational History  . Occupation: Surveyor, minerals: UNEMPLOYED  Tobacco Use  . Smoking status: Former Smoker    Packs/day: 0.25    Years: 5.00    Pack years: 1.25    Types: Cigarettes    Quit date: 06/29/1968    Years since quitting: 52.1  . Smokeless tobacco: Never Used  Vaping Use  . Vaping Use: Never used  Substance and Sexual Activity  . Alcohol use: No    Alcohol/week: 0.0 standard drinks  . Drug use: No  . Sexual activity: Not on file  Other Topics Concern  . Not on file  Social History Narrative   Widowed 2010.    Social Determinants of Health   Financial Resource Strain: Low Risk   . Difficulty of Paying Living Expenses: Not hard at all  Food Insecurity: No Food Insecurity  . Worried About Charity fundraiser in the Last Year: Never true  . Ran Out of Food in the Last Year: Never true  Transportation Needs: No Transportation Needs  . Lack of Transportation (Medical): No  . Lack of Transportation (Non-Medical): No  Physical Activity: Sufficiently Active  . Days of Exercise per Week: 5 days  . Minutes of Exercise per Session: 30 min  Stress: No Stress Concern Present  . Feeling of Stress : Not at all  Social Connections: Moderately Integrated  . Frequency of Communication with Friends and Family: More than three times a week  . Frequency of Social Gatherings with Friends and Family: Once a week  . Attends Religious Services: More than 4 times per year  . Active Member of Clubs or Organizations: No  . Attends Archivist Meetings: More than 4 times per year  . Marital Status: Widowed     Family History:  The patient's   family history includes Allergic  rhinitis in her mother; Heart disease in her father, maternal grandfather, and mother; Rectal cancer in her maternal grandfather; Stomach cancer in her maternal grandmother.   ROS:   Please see the history of present illness.    ROS All other systems reviewed and are negative.   PHYSICAL EXAM:   VS:  BP (!) 130/92   Pulse 90   Ht 5' 4.5" (1.638 m)   Wt 160 lb 12.8 oz (72.9 kg)   SpO2 99%   BMI 27.17 kg/m   Physical Exam  GEN: Well nourished, well developed, in no acute distress  Neck:  no JVD, carotid bruits, or masses Cardiac:RRR; no murmurs, rubs, or gallops  Respiratory:  clear to auscultation bilaterally, normal work of breathing GI: soft, nontender, nondistended, + BS Ext: without cyanosis, clubbing, or edema, Good distal pulses bilaterally Neuro:  Alert and Oriented x 3 Psych: euthymic mood, full affect  Wt Readings from Last 3 Encounters:  08/26/20 160 lb 12.8 oz (72.9 kg)  07/26/20 164 lb (74.4 kg)  07/02/20 161 lb 12.8 oz (73.4 kg)    Studies/Labs Reviewed:   EKG:  EKG today shows sinus rhythm, 86 bpm, PVCs that are new, otherwise normal EKG.  This was personally reviewed.  Recent Labs: 12/15/2019: Magnesium 1.9 04/05/2020: Pro B Natriuretic peptide (BNP) 88.0 07/26/2020: Hemoglobin 12.7; Platelets 223.0; TSH 1.99 08/16/2020: ALT 18; BUN 7; Creatinine, Ser 0.66; Potassium 4.0; Sodium 142   Lipid Panel    Component Value Date/Time   CHOL 166 02/19/2020 0802   TRIG 92 02/19/2020 0802   TRIG 48 05/03/2006 0733   HDL 56 02/19/2020 0802   CHOLHDL 3.0 02/19/2020 0802   CHOLHDL 3 07/04/2018 1554   VLDL 12.8 07/04/2018 1554   LDLCALC 93 02/19/2020 0802   LDLDIRECT 165.6 06/19/2009 0731    Additional studies/ records that were reviewed today include:  NST 03/10/2019  Nuclear stress EF: 59%.  There was no ST segment deviation noted during stress.  The study is normal.  This is a low risk study.  The left ventricular ejection fraction is normal (55-65%).    Normal stress nuclear study with no ischemia or infarction.  Gated ejection fraction 59% with normal wall motion.    Nuclear History and Indications   2D echo 03/10/2019 IMPRESSIONS     1. The left ventricle has normal systolic function, with an ejection  fraction of 55-60%. The cavity size was normal. There is mild concentric  left ventricular hypertrophy. Left ventricular diastolic Doppler  parameters are indeterminate. No evidence of  left ventricular regional wall motion abnormalities.   2. Trivial pericardial effusion is present.   3. The aortic valve is tricuspid. No stenosis of the aortic valve.   4. The aorta is normal unless otherwise noted.   5. The aortic root, ascending aorta and aortic arch are normal in size  and structure.   6. Borderline enlarged pulmonary artery.   FINDINGS   Left Ventricle: The left ventricle has normal systolic function, with an  ejection fraction of 55-60%. The cavity size was normal. There is mild  concentric left ventricular hypertrophy. Left ventricular diastolic  Doppler parameters are indeterminate. No  evidence of left ventricular regional wall motion abnormalities..   Right Ventricle: The right ventricle has low normal systolic function. The  cavity was normal. There is no increase in right ventricular wall  thickness. Right ventricular systolic pressure is normal.   Left Atrium: Left atrial size was normal in size.   Right Atrium: Right atrial size was normal in size.   Interatrial Septum: No atrial level shunt detected by color flow Doppler.   Pericardium: Trivial pericardial effusion is present. There is a  pericardial fat pad noted.   Mitral Valve: The mitral valve is normal in structure. Mitral valve  regurgitation is trivial by color flow Doppler.   Tricuspid Valve: The tricuspid valve is normal in structure. Tricuspid  valve regurgitation is mild by color flow Doppler.   Aortic Valve: The aortic valve is tricuspid  Aortic valve regurgitation was  not visualized by color flow Doppler. There is No stenosis of the  aortic  valve.   Pulmonic Valve: The pulmonic valve was grossly normal. Pulmonic valve  regurgitation is not visualized by color flow Doppler. No evidence of  pulmonic stenosis.   Aorta: The aortic root, ascending aorta and aortic arch are normal in size  and structure. The aorta is normal unless otherwise noted.   Pulmonary Artery: The pulmonary artery is borderline enlarged.       ASSESSMENT:    1. CAD in native artery   2. Essential hypertension   3. Mixed hyperlipidemia     PLAN:  In order of problems listed above:  CAD nonobstructive on coronary CTA 2015, normal NST 02/2019, chest pain felt to be muscular and improved with PT. she is currently asymptomatic with no ischemic changes on her EKG.  Essential hypertension BP -uncontrolled, I will increase carvedilol to 12.5 mg p.o. twice daily and valsartan 260 mg p.o. daily, she will send [place] her new blood pressure measurements and symptoms through MyChart.  History of pulmonary hypertension echo 10/386 RV systolic pressure is normal  Hyperlipidemia - on low-dose Crestor 5 mg daily.  We will check her lipids today.  Recent LFTs were normal.  Medication Adjustments/Labs and Tests Ordered: Current medicines are reviewed at length with the patient today.  Concerns regarding medicines are outlined above.  Medication changes, Labs and Tests ordered today are listed in the Patient Instructions below. Patient Instructions  Medication Instructions:   INCREASE YOUR CARVEDILOL TO 12.5 MG BY MOUTH TWICE DAILY  INCREASE YOUR VALSARTAN TO 160 MG BY MOUTH DAILY  *If you need a refill on your cardiac medications before your next appointment, please call your pharmacy*   Lab Work:  TODAY--LIPIDS  If you have labs (blood work) drawn today and your tests are completely normal, you will receive your results only by: Marland Kitchen MyChart Message (if  you have MyChart) OR . A paper copy in the mail If you have any lab test that is abnormal or we need to change your treatment, we will call you to review the results.   Follow-Up: At [facility], you and your health needs are our priority.  As part of our continuing mission to provide you with exceptional heart care, we have created designated Provider Care Teams.  These Care Teams include your primary Cardiologist (physician) and Advanced Practice Providers (APPs -  Physician Assistants and Nurse Practitioners) who all work together to provide you with the care you need, when you need it.  We recommend signing up for the patient portal called "MyChart".  Sign up information is provided on this After Visit Summary.  MyChart is used to connect with patients for Virtual Visits (Telemedicine).  Patients are able to view lab/test results, encounter notes, upcoming appointments, etc.  Non-urgent messages can be sent to your provider as well.   To learn more about what you can do with MyChart, go to [email or web address].    Your next appointment:   6 month(s)  The format for your next appointment:   In Person  Provider:   Gwyndolyn Kaufman, MD   Other Instructions  PLEASE SIGN UP FOR MYCHART AND SEND [place] SOME BP READINGS IN THE NEXT WEEK OR 2.      Signed, Amy Dawley, MD  08/26/2020 8:50 AM    [facility] [place], [place], [place]  [address] Phone: [phone]; Fax: [phone]

## 2020-08-26 NOTE — Patient Instructions (Signed)
Medication Instructions:   INCREASE YOUR CARVEDILOL TO 12.5 MG BY MOUTH TWICE DAILY  INCREASE YOUR VALSARTAN TO 160 MG BY MOUTH DAILY  *If you need a refill on your cardiac medications before your next appointment, please call your pharmacy*   Lab Work:  TODAY--LIPIDS  If you have labs (blood work) drawn today and your tests are completely normal, you will receive your results only by: Marland Kitchen MyChart Message (if you have MyChart) OR . A paper copy in the mail If you have any lab test that is abnormal or we need to change your treatment, we will call you to review the results.   Follow-Up: At [facility], you and your health needs are our priority.  As part of our continuing mission to provide you with exceptional heart care, we have created designated Provider Care Teams.  These Care Teams include your primary Cardiologist (physician) and Advanced Practice Providers (APPs -  Physician Assistants and Nurse Practitioners) who all work together to provide you with the care you need, when you need it.  We recommend signing up for the patient portal called "MyChart".  Sign up information is provided on this After Visit Summary.  MyChart is used to connect with patients for Virtual Visits (Telemedicine).  Patients are able to view lab/test results, encounter notes, upcoming appointments, etc.  Non-urgent messages can be sent to your provider as well.   To learn more about what you can do with MyChart, go to [email or web address].    Your next appointment:   6 month(s)  The format for your next appointment:   In Person  Provider:   Gwyndolyn Kaufman, MD   Other Instructions  PLEASE SIGN UP FOR MYCHART AND SEND [place] SOME BP READINGS IN THE NEXT WEEK OR 2.

## 2020-08-27 ENCOUNTER — Ambulatory Visit (INDEPENDENT_AMBULATORY_CARE_PROVIDER_SITE_OTHER): Payer: Medicare Other

## 2020-08-27 ENCOUNTER — Encounter: Payer: Self-pay | Admitting: Family Medicine

## 2020-08-27 ENCOUNTER — Ambulatory Visit (INDEPENDENT_AMBULATORY_CARE_PROVIDER_SITE_OTHER): Payer: Medicare Other | Admitting: Family Medicine

## 2020-08-27 ENCOUNTER — Other Ambulatory Visit: Payer: Self-pay | Admitting: Family Medicine

## 2020-08-27 VITALS — BP 122/88 | HR 90 | Ht 64.5 in | Wt 162.0 lb

## 2020-08-27 DIAGNOSIS — M170 Bilateral primary osteoarthritis of knee: Secondary | ICD-10-CM

## 2020-08-27 DIAGNOSIS — M25562 Pain in left knee: Secondary | ICD-10-CM | POA: Diagnosis not present

## 2020-08-27 DIAGNOSIS — M25561 Pain in right knee: Secondary | ICD-10-CM

## 2020-08-27 DIAGNOSIS — M2550 Pain in unspecified joint: Secondary | ICD-10-CM | POA: Diagnosis not present

## 2020-08-27 DIAGNOSIS — G8929 Other chronic pain: Secondary | ICD-10-CM

## 2020-08-27 LAB — VITAMIN D 25 HYDROXY (VIT D DEFICIENCY, FRACTURES): VITD: 76.01 ng/mL (ref 30.00–100.00)

## 2020-08-27 LAB — LIPID PANEL
Chol/HDL Ratio: 3.7 ratio (ref 0.0–4.4)
Cholesterol, Total: 162 mg/dL (ref 100–199)
HDL: 44 mg/dL (ref >39)
LDL Chol Calc (NIH): 99 mg/dL (ref 0–99)
Triglycerides: 105 mg/dL (ref 0–149)
VLDL Cholesterol Cal: 19 mg/dL (ref 5–40)

## 2020-08-27 NOTE — Assessment & Plan Note (Signed)
Patient given repeat injections.  Patient feels that the viscosupplementation did not last long term.  Patient continues to have pain.  We will repeat x-rays to further evaluate the amount of arthritic changes.  Patient has had difficulty with vitamin D and will get laboratory work-up for that.  Discussed with patient to continue to stay active where possible.  Patient can follow-up again in 2 to 3 months otherwise.  Patient feels that the viscosupplementation likely is not helping as much anymore.  I do both think that this still is a possibility long-term for her if she wants to avoid the surgical intervention.

## 2020-08-27 NOTE — Patient Instructions (Signed)
Xray today Labs today Lets see what labs say-Wait for lab results before you pick up Vit D rx See me again in 3 months

## 2020-09-04 ENCOUNTER — Encounter (INDEPENDENT_AMBULATORY_CARE_PROVIDER_SITE_OTHER): Payer: Self-pay | Admitting: Otolaryngology

## 2020-09-04 ENCOUNTER — Ambulatory Visit (INDEPENDENT_AMBULATORY_CARE_PROVIDER_SITE_OTHER): Payer: Medicare Other | Admitting: Otolaryngology

## 2020-09-04 ENCOUNTER — Other Ambulatory Visit: Payer: Self-pay

## 2020-09-04 VITALS — Temp 96.4°F

## 2020-09-04 DIAGNOSIS — G44229 Chronic tension-type headache, not intractable: Secondary | ICD-10-CM | POA: Diagnosis not present

## 2020-09-04 DIAGNOSIS — M26609 Unspecified temporomandibular joint disorder, unspecified side: Secondary | ICD-10-CM

## 2020-09-04 NOTE — Progress Notes (Signed)
HPI: Amy Escobar is a 75 y.o. female who presents is referred by her PCP for evaluation of abnormality noted on a CT scan because of history of headaches and dizziness.  On the CT scan there was a small polypoid mass noted in the posterior right nasal cavity.  I reviewed this and this showed a small polypoid lesion in the posterior right superior nasal cavity that is not obstructing any of the sinuses as all of the sinus regions were clear but it was adjacent to the opening of the right sphenoid sinus.  Patient is having no trouble breathing through her nose and no discharge from the nose. Patient has apparently been having headaches for the last 2 to 3 months.  Past Medical History:  Diagnosis Date  . Alkaline phosphatase deficiency    w/u Ne  . Allergic rhinitis   . Anxiety   . Arthritis   . DM2 (diabetes mellitus, type 2) ([place])   . GERD (gastroesophageal reflux disease)   . Gout   . Hemorrhoids   . Hyperlipidemia   . Hypertension   . Mild pulmonary hypertension ([place])   . NSVT (nonsustained ventricular tachycardia) ([place])   . Osteoporosis   . PVC's (premature ventricular contractions)   . Thyroid nodule    small  . Tubular adenoma of colon 2017  . Urticaria   . Uterine fibroid    Past Surgical History:  Procedure Laterality Date  . CATARACT EXTRACTION Bilateral 2016  . COLONOSCOPY  2007  . echocardiogram (other)  01/16/2002  . 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 stress cardiolite  02/12/2006  . TOENAIL EXCISION    . TUBAL LIGATION    . TYMPANOSTOMY TUBE PLACEMENT     Social History   Socioeconomic History  . Marital status: Widowed    Spouse name: Not on file  . Number of children: 2  . Years of education: Not on file  . Highest education level: Not on file  Occupational History  . Occupation: Surveyor, minerals: UNEMPLOYED  Tobacco Use  . Smoking status: Former Smoker    Packs/day: 0.25    Years: 5.00    Pack years: 1.25    Types: Cigarettes     Quit date: 06/29/1968    Years since quitting: 52.2  . Smokeless tobacco: Never Used  Vaping Use  . Vaping Use: Never used  Substance and Sexual Activity  . Alcohol use: No    Alcohol/week: 0.0 standard drinks  . Drug use: No  . Sexual activity: Not on file  Other Topics Concern  . Not on file  Social History Narrative   Widowed 2010.    Social Determinants of Health   Financial Resource Strain: Low Risk   . Difficulty of Paying Living Expenses: Not hard at all  Food Insecurity: No Food Insecurity  . Worried About Charity fundraiser in the Last Year: Never true  . Ran Out of Food in the Last Year: Never true  Transportation Needs: No Transportation Needs  . Lack of Transportation (Medical): No  . Lack of Transportation (Non-Medical): No  Physical Activity: Sufficiently Active  . Days of Exercise per Week: 5 days  . Minutes of Exercise per Session: 30 min  Stress: No Stress Concern Present  . Feeling of Stress : Not at all  Social Connections: Moderately Integrated  . Frequency of Communication with Friends and Family: More than three times a week  . Frequency of Social Gatherings  with Friends and Family: Once a week  . Attends Religious Services: More than 4 times per year  . Active Member of Clubs or Organizations: No  . Attends Archivist Meetings: More than 4 times per year  . Marital Status: Widowed   Family History  Problem Relation Age of Onset  . Heart disease Mother   . Allergic rhinitis Mother   . Heart disease Father   . Heart disease Maternal Grandfather   . Rectal cancer Maternal Grandfather   . Stomach cancer Maternal Grandmother   . Colon cancer Neg Hx   . Breast cancer Neg Hx    Allergies  Allergen Reactions  . Olmesartan Medoxomil     REACTION: headache   Prior to Admission medications   Medication Sig Start Date End Date Taking? Authorizing Provider  aspirin EC 81 MG tablet Take 81 mg by mouth daily. Swallow whole.    [provider]  benzonatate (TESSALON) 100 MG capsule Take by mouth as needed for cough.    [provider]  calcium elemental as carbonate (ANTACID MAXIMUM) 400 MG chewable tablet Chew 1,000 mg by mouth as needed for heartburn.    [provider]  carvedilol (COREG) 12.5 MG tablet Take 1 tablet (12.5 mg total) by mouth 2 (two) times daily with a meal. 08/26/20   Dorothy Spark, MD  Chlorpheniramine-Acetaminophen (CORICIDIN HBP COLD/FLU PO) Take by mouth.    [provider]  Cyanocobalamin (B-12 PO) Take by mouth daily.    [provider]  DULoxetine (CYMBALTA) 30 MG capsule Take 1 capsule (30 mg total) by mouth 2 (two) times daily. 04/26/20   [name], FNP  esomeprazole (NEXIUM) 40 MG capsule TAKE ONE CAPSULE BY MOUTH DAILY 30 TO 60 MINUTES BEFORE YOUR FIRST AND LAST MEALS OF THE DAY 01/17/20   Tanda Rockers, MD  fesoterodine (TOVIAZ) 4 MG TB24 tablet Take 4 mg daily by mouth.    [provider]  glucose blood (ONETOUCH VERIO) test strip Use to check blood sugar 2 times a day. 12/12/19   Renato Shin, MD  guaiFENesin (MUCINEX) 600 MG 12 hr tablet Take 600 mg by mouth 2 (two) times daily as needed.    [provider]  metFORMIN (GLUCOPHAGE-XR) 500 MG 24 hr tablet TAKE TWO TABLETS BY MOUTH TWICE A DAY 07/12/20   Renato Shin, MD  Olopatadine HCl 0.2 % SOLN Place 1 drop into both eyes daily. 04/02/20 04/02/21  Rankin, Clent Demark, MD  Respiratory Therapy Supplies MISC Use flutter valve as needed to break the coughing cycle. 06/26/19   Bobbitt, [place] Muta, MD  rosuvastatin (CRESTOR) 5 MG tablet TAKE ONE TABLET BY MOUTH DAILY 08/12/20   Dorothy Spark, MD  valsartan (DIOVAN) 160 MG tablet Take 1 tablet (160 mg total) by mouth daily. 08/26/20   Dorothy Spark, MD  Vitamin D, Ergocalciferol, (DRISDOL) 1.25 MG (50000 UNIT) CAPS capsule TAKE ONE CAPSULE BY MOUTH ONCE WEEKLY 08/27/20   Lyndal Pulley, DO     Positive ROS: Otherwise  negative  All other systems have been reviewed and were otherwise negative with the exception of those mentioned in the HPI and as above.  Physical Exam: Constitutional: Alert, well-appearing, no acute distress Ears: External ears without lesions or tenderness. Ear canals are clear bilaterally with intact, clear TMs.  Nasal: External nose without lesions. Septum deviated to the right.  Nasal endoscopy was performed on both sides and on nasal endoscopy she has slight  congestion in the superior right nasal cavity with normal-appearing mucosa otherwise.  She has had no bleeding and no abnormal mucosal lesions noted..  Oral: Lips and gums without lesions. Tongue and palate mucosa without lesions. Posterior oropharynx clear. Neck: No palpable adenopathy or masses.  She does have some slight TMJ discomfort.  More so on the right side. Respiratory: Breathing comfortably  Skin: No facial/neck lesions or rash noted.  Procedures  Assessment: History of headaches do not appear to be arising from the nasal cavity problems or sinuses.  Plan: Patient feels like she may be having some abnormality with her dentures and is looking to get new dentures.  This may be contributing to her headaches. Nasal sinus CT scan was clear except for what was mentioned in the report but this is benign and not causing headaches.   Radene Journey, MD   CC:

## 2020-09-20 ENCOUNTER — Other Ambulatory Visit: Payer: Self-pay | Admitting: Cardiology

## 2020-09-24 ENCOUNTER — Encounter (INDEPENDENT_AMBULATORY_CARE_PROVIDER_SITE_OTHER): Payer: Self-pay | Admitting: Ophthalmology

## 2020-09-24 ENCOUNTER — Ambulatory Visit (INDEPENDENT_AMBULATORY_CARE_PROVIDER_SITE_OTHER): Payer: Medicare Other | Admitting: Ophthalmology

## 2020-09-24 ENCOUNTER — Other Ambulatory Visit: Payer: Self-pay

## 2020-09-24 DIAGNOSIS — H35413 Lattice degeneration of retina, bilateral: Secondary | ICD-10-CM | POA: Diagnosis not present

## 2020-09-24 DIAGNOSIS — E113393 Type 2 diabetes mellitus with moderate nonproliferative diabetic retinopathy without macular edema, bilateral: Secondary | ICD-10-CM | POA: Diagnosis not present

## 2020-09-24 NOTE — Progress Notes (Signed)
09/24/2020     CHIEF COMPLAINT Patient presents for Retina Follow Up (9 Month Diabetic f\u OU. FP/Pt states OU vision occasional becomes cloudy. Pt states seasonal allergies are affecting OU. /BGL: did not check, usually runs about 120/A1C: 6.2)   HISTORY OF PRESENT ILLNESS: Amy Escobar is a 75 y.o. female who presents to the clinic today for:   HPI    Retina Follow Up    Patient presents with  Diabetic Retinopathy.  In both eyes.  Severity is moderate.  Duration of 9 months.  Since onset it is stable.  I, the attending physician,  performed the HPI with the patient and updated documentation appropriately. Additional comments: 9 Month Diabetic f\u OU. FP Pt states OU vision occasional becomes cloudy. Pt states seasonal allergies are affecting OU.  BGL: did not check, usually runs about 120 A1C: 6.2       Last edited by Tilda Franco on 09/24/2020  8:34 AM. (History)      Referring physician: [name], [place],  [place] [address]  HISTORICAL INFORMATION:   Selected notes from the MEDICAL RECORD NUMBER    Lab Results  Component Value Date   HGBA1C 6.2 07/26/2020     CURRENT MEDICATIONS: Current Outpatient Medications (Ophthalmic Drugs)  Medication Sig  . Olopatadine HCl 0.2 % SOLN Place 1 drop into both eyes daily.   No current facility-administered medications for this visit. (Ophthalmic Drugs)   Current Outpatient Medications (Other)  Medication Sig  . aspirin EC 81 MG tablet Take 81 mg by mouth daily. Swallow whole.  . benzonatate (TESSALON) 100 MG capsule Take by mouth as needed for cough.  . calcium elemental as carbonate (ANTACID MAXIMUM) 400 MG chewable tablet Chew 1,000 mg by mouth as needed for heartburn.  . carvedilol (COREG) 12.5 MG tablet Take 1 tablet (12.5 mg total) by mouth 2 (two) times daily with a meal.  . Chlorpheniramine-Acetaminophen (CORICIDIN HBP COLD/FLU PO) Take by mouth.  . Cyanocobalamin (B-12 PO)  Take by mouth daily.  . DULoxetine (CYMBALTA) 30 MG capsule Take 1 capsule (30 mg total) by mouth 2 (two) times daily.  Marland Kitchen esomeprazole (NEXIUM) 40 MG capsule TAKE ONE CAPSULE BY MOUTH DAILY 30 TO 60 MINUTES BEFORE YOUR FIRST AND LAST MEALS OF THE DAY  . fesoterodine (TOVIAZ) 4 MG TB24 tablet Take 4 mg daily by mouth.  Marland Kitchen glucose blood (ONETOUCH VERIO) test strip Use to check blood sugar 2 times a day.  Marland Kitchen guaiFENesin (MUCINEX) 600 MG 12 hr tablet Take 600 mg by mouth 2 (two) times daily as needed.  . metFORMIN (GLUCOPHAGE-XR) 500 MG 24 hr tablet TAKE TWO TABLETS BY MOUTH TWICE A DAY  . Respiratory Therapy Supplies MISC Use flutter valve as needed to break the coughing cycle.  . rosuvastatin (CRESTOR) 5 MG tablet TAKE ONE TABLET BY MOUTH DAILY  . valsartan (DIOVAN) 160 MG tablet Take 1 tablet (160 mg total) by mouth daily.  . Vitamin D, Ergocalciferol, (DRISDOL) 1.25 MG (50000 UNIT) CAPS capsule TAKE ONE CAPSULE BY MOUTH ONCE WEEKLY   No current facility-administered medications for this visit. (Other)      REVIEW OF SYSTEMS: ROS    Positive for: Endocrine   Last edited by Tilda Franco on 09/24/2020  8:34 AM. (History)       ALLERGIES Allergies  Allergen Reactions  . Olmesartan Medoxomil     REACTION: headache    PAST MEDICAL HISTORY Past Medical History:  Diagnosis Date  . Alkaline phosphatase deficiency    w/u Ne  . Allergic rhinitis   . Anxiety   . Arthritis   . DM2 (diabetes mellitus, type 2) ([place])   . GERD (gastroesophageal reflux disease)   . Gout   . Hemorrhoids   . Hyperlipidemia   . Hypertension   . Mild pulmonary hypertension ([place])   . NSVT (nonsustained ventricular tachycardia) ([place])   . Osteoporosis   . PVC's (premature ventricular contractions)   . Thyroid nodule    small  . Tubular adenoma of colon 2017  . Urticaria   . Uterine fibroid    Past Surgical History:  Procedure Laterality Date  . CATARACT EXTRACTION Bilateral 2016  . COLONOSCOPY   2007  . echocardiogram (other)  01/16/2002  . 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 stress cardiolite  02/12/2006  . TOENAIL EXCISION    . TUBAL LIGATION    . TYMPANOSTOMY TUBE PLACEMENT      FAMILY HISTORY Family History  Problem Relation Age of Onset  . Heart disease Mother   . Allergic rhinitis Mother   . Heart disease Father   . Heart disease Maternal Grandfather   . Rectal cancer Maternal Grandfather   . Stomach cancer Maternal Grandmother   . Colon cancer Neg Hx   . Breast cancer Neg Hx     SOCIAL HISTORY Social History   Tobacco Use  . Smoking status: Former Smoker    Packs/day: 0.25    Years: 5.00    Pack years: 1.25    Types: Cigarettes    Quit date: 06/29/1968    Years since quitting: 52.2  . Smokeless tobacco: Never Used  Vaping Use  . Vaping Use: Never used  Substance Use Topics  . Alcohol use: No    Alcohol/week: 0.0 standard drinks  . Drug use: No         OPHTHALMIC EXAM:  Base Eye Exam    Visual Acuity (Snellen - Linear)      Right Left   Dist [place] 20/20 20/20 -2       Tonometry (Tonopen, 8:38 AM)      Right Left   Pressure 10 11       Pupils      Pupils Dark Light Shape React APD   Right PERRL 4 3 Round Slow None   Left PERRL 4 3 Round Slow None       Visual Fields (Counting fingers)      Left Right    Full Full       Neuro/Psych    Oriented x3: Yes   Mood/Affect: Normal       Dilation    Both eyes: 1.0% Mydriacyl, 2.5% Phenylephrine @ 8:38 AM        Slit Lamp and Fundus Exam    External Exam      Right Left   External Normal Normal       Slit Lamp Exam      Right Left   Lids/Lashes Normal Normal   Conjunctiva/Sclera White and quiet White and quiet   Cornea Clear Clear   Anterior Chamber Deep and quiet Deep and quiet   Iris Round and reactive Round and reactive   Lens Posterior chamber intraocular lens Posterior chamber intraocular lens   Anterior Vitreous Normal Normal       Fundus Exam      Right Left    Posterior Vitreous Normal Normal   Disc Normal Normal  C/D Ratio 0.25 0.35   Macula Normal Normal   Vessels NPDR- Moderate NPDR- Moderate   Periphery , Heavy pigmented region supero temporal to the macula OD, unchanged with good peripheral PRP in the temporal anterior periphery in regions of previous "white lines" of nonperfusion. , Heavy pigmented region  temporal to the macula OD, unchanged with good peripheral PRP in the temporal anterior periphery in regions of previous "white lines" of nonperfusion.          IMAGING AND PROCEDURES  Imaging and Procedures for 09/24/20  Color Fundus Photography Optos - OU - Both Eyes       Right Eye Progression has been stable. Disc findings include normal observations. Macula : normal observations.   Left Eye Progression has been stable.   Notes OD, moderate nonproliferative diabetic retinopathy stable.  Good PRP in the anterior temporal periphery and regions of retinal nonperfusion   OS, moderate nonproliferative diabetic retinopathy, stable, good PRP in the anterior temporal periphery periphery and regions of nonperfusion.  Old lattice type degeneration temporal to the macula stable                ASSESSMENT/PLAN:  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 edema associated with type 2 diabetes mellitus ([place]) OU, with moderate nonproliferative diabetic retinopathy and no progression over the last 69-month interval.  Heavily pigmented area temporal portion of the macula left eye stable after regional PRP delivered to prevent progression of retinal nonperfusion to neovascular disease in the past.  OD similarly far temporal periphery with regional PRP for incidental note of retinal nonperfusion.  Otherwise there is no significant diabetic retinopathy progression thus  follow-up in 1 year is appropriate  Lattice degeneration of both retinas No new retinal holes or tears observe      ICD-10-CM   1.  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 Lattice degeneration of both retinas  H35.413     1.  OU, stable we will continue to observe 2.  3.  Ophthalmic Meds Ordered this visit:  No orders of the defined types were placed in this encounter.      Return in about 1 year (around 09/24/2021) for DILATE OU, OCT, COLOR FP.  There are no Patient Instructions on file for this visit.   Explained the diagnoses, plan, and follow up with the patient and they expressed understanding.  Patient expressed understanding of the importance of proper follow up care.   Clent Demark Chaun Uemura M.D. Diseases & Surgery of the Retina and Vitreous Retina & Diabetic [place] 09/24/20     Abbreviations: M myopia (nearsighted); A astigmatism; H hyperopia (farsighted); P presbyopia; Mrx spectacle prescription;  CTL contact lenses; OD right eye; OS left eye; OU both eyes  XT exotropia; ET esotropia; PEK punctate epithelial keratitis; PEE punctate epithelial erosions; DES dry eye syndrome; MGD meibomian gland dysfunction; ATs artificial tears; PFAT's preservative free artificial tears; [place] nuclear sclerotic cataract; PSC posterior subcapsular cataract; ERM epi-retinal membrane; PVD posterior vitreous detachment; RD retinal detachment; DM diabetes mellitus; DR diabetic retinopathy; NPDR non-proliferative diabetic retinopathy; PDR proliferative diabetic retinopathy; CSME clinically significant macular edema; DME diabetic macular edema; dbh dot blot hemorrhages; CWS cotton wool spot; POAG primary open angle glaucoma; C/D cup-to-disc ratio; HVF humphrey visual field; GVF goldmann visual field; OCT optical coherence tomography; IOP intraocular pressure; BRVO Branch retinal vein occlusion;  CRVO central retinal vein occlusion; CRAO central retinal artery occlusion; BRAO branch retinal  artery occlusion; RT retinal tear; SB scleral buckle; PPV pars plana vitrectomy; VH Vitreous hemorrhage; PRP panretinal laser photocoagulation; IVK intravitreal kenalog; VMT vitreomacular traction; MH Macular hole;  NVD neovascularization of the disc; NVE neovascularization elsewhere; AREDS age related eye disease study; ARMD age related macular degeneration; POAG primary open angle glaucoma; EBMD epithelial/anterior basement membrane dystrophy; ACIOL anterior chamber intraocular lens; IOL intraocular lens; PCIOL posterior chamber intraocular lens; Phaco/IOL phacoemulsification with intraocular lens placement; [place] photorefractive keratectomy; LASIK laser assisted in situ keratomileusis; HTN hypertension; DM diabetes mellitus; COPD chronic obstructive pulmonary disease

## 2020-09-24 NOTE — Assessment & Plan Note (Addendum)
OU, with moderate nonproliferative diabetic retinopathy and no progression over the last 72-month interval.  Heavily pigmented area temporal portion of the macula left eye stable after regional PRP delivered to prevent progression of retinal nonperfusion to neovascular disease in the past.  OD similarly far temporal periphery with regional PRP for incidental note of retinal nonperfusion.  Otherwise there is no significant diabetic retinopathy progression thus  follow-up in 1 year is appropriate

## 2020-09-24 NOTE — Assessment & Plan Note (Signed)
No new retinal holes or tears observe

## 2020-09-26 ENCOUNTER — Other Ambulatory Visit: Payer: Self-pay | Admitting: Internal Medicine

## 2020-09-26 ENCOUNTER — Telehealth: Payer: Self-pay | Admitting: Internal Medicine

## 2020-09-26 NOTE — Telephone Encounter (Signed)
Ok x 2 refills but let's plan on f/u ov before these run out with all meds in hand is this medication does not cure the cough, it just suppresses it

## 2020-09-26 NOTE — Telephone Encounter (Signed)
Pt is requesting refill on Tessalon Pearls. In last OV date it was stated she should contuine med during the day as needed for cough. Would you like [place] to refill this medication. Last ordered on 08/20/2020, Dr. Melvyn Novas please advise.

## 2020-09-26 NOTE — Telephone Encounter (Signed)
Attempted to call pt but unable to reach. Left message for her to return call. 

## 2020-09-27 MED ORDER — BENZONATATE 100 MG PO CAPS: ORAL_CAPSULE | ORAL | 2 refills | Status: DC

## 2020-09-27 NOTE — Telephone Encounter (Signed)
Called and spoke with patient. She is aware that MW is ok with sending the refills. I attempted to her scheduled for a follow up but she stated that she would have to call back since she was not at home to look at her calendar.   RX has been sent. Nothing further needed at time of call.

## 2020-10-12 ENCOUNTER — Other Ambulatory Visit: Payer: Self-pay | Admitting: Endocrinology

## 2020-10-16 ENCOUNTER — Other Ambulatory Visit: Payer: Self-pay

## 2020-10-16 ENCOUNTER — Ambulatory Visit (INDEPENDENT_AMBULATORY_CARE_PROVIDER_SITE_OTHER): Payer: Medicare Other | Admitting: Podiatry

## 2020-10-16 ENCOUNTER — Encounter: Payer: Self-pay | Admitting: Podiatry

## 2020-10-16 DIAGNOSIS — M2010 Hallux valgus (acquired), unspecified foot: Secondary | ICD-10-CM

## 2020-10-16 DIAGNOSIS — E1142 Type 2 diabetes mellitus with diabetic polyneuropathy: Secondary | ICD-10-CM | POA: Diagnosis not present

## 2020-10-16 NOTE — Progress Notes (Addendum)
Patient presented for foam casting for 3 pair custom diabetic shoe inserts. Patient is measured with a [place] Device to be a size 8 1/2 medium  Diabetic shoes are chosen from the [employer] catalog.  The shoes chosen are [id number]  The patient will be contacted when the shoes and inserts are ready for pick up.  Gardiner Barefoot DPM

## 2020-10-16 NOTE — Progress Notes (Signed)
This patient presents to the office to pick up new diabetic shoes.  She has been diagnosed with diabetic neuropathy and HAV  B/L.  Vascular  Dorsalis pedis and posterior tibial pulses are palpable  B/L.  Capillary return  WNL.  Temperature gradient is  WNL.  Skin turgor  WNL  Sensorium  Senn Weinstein monofilament wire  WNL. Normal tactile sensation.  Nail Exam  Patient has normal nails with no evidence of bacterial or fungal infection.  Orthopedic  Exam  Muscle tone and muscle strength  WNL.  No limitations of motion feet  B/L.  No crepitus or joint effusion noted.  Foot type is unremarkable and digits show no abnormalities.  HAV  Bilateral.  Skin  No open lesions.  Normal skin texture and turgor.  Diabetic neuropathy  HAV  B/L.  ROV.  Diabetic exam reveals no vascular or neurologic pathology.  Patient to be measured for her new diabetic shoes today.   Gardiner Barefoot DPM

## 2020-10-21 ENCOUNTER — Telehealth: Payer: Self-pay | Admitting: Family

## 2020-10-21 NOTE — Telephone Encounter (Signed)
Pt called she would like an antibiotic prescription sent for her, she has an uti   Please advice

## 2020-10-22 MED ORDER — NITROFURANTOIN MONOHYD MACRO 100 MG PO CAPS: 100.0000 mg | ORAL_CAPSULE | Freq: Two times a day (BID) | ORAL | 0 refills | Status: DC

## 2020-10-22 NOTE — Telephone Encounter (Signed)
I have called pt back and relayed then message from the provider and she stated understanding.

## 2020-10-22 NOTE — Telephone Encounter (Signed)
Will send in antibiotic for her; keep planned appointment on Thursday as scheduled.

## 2020-10-22 NOTE — Telephone Encounter (Signed)
Pt reports that she is having Urinary burning and she is taking the Azo to help. She is also having urinary urgency and she has has this in the past. She has now has an appt 10/24/20 @ 10 am.   Please advise.

## 2020-10-24 ENCOUNTER — Other Ambulatory Visit: Payer: Self-pay | Admitting: *Deleted

## 2020-10-24 ENCOUNTER — Ambulatory Visit (INDEPENDENT_AMBULATORY_CARE_PROVIDER_SITE_OTHER): Payer: Medicare Other | Admitting: Family

## 2020-10-24 ENCOUNTER — Other Ambulatory Visit: Payer: Self-pay

## 2020-10-24 ENCOUNTER — Encounter: Payer: Self-pay | Admitting: Family

## 2020-10-24 VITALS — BP 110/70 | HR 80 | Temp 98.1°F | Ht 64.0 in | Wt 161.8 lb

## 2020-10-24 DIAGNOSIS — I10 Essential (primary) hypertension: Secondary | ICD-10-CM | POA: Diagnosis not present

## 2020-10-24 DIAGNOSIS — E118 Type 2 diabetes mellitus with unspecified complications: Secondary | ICD-10-CM

## 2020-10-24 DIAGNOSIS — E1142 Type 2 diabetes mellitus with diabetic polyneuropathy: Secondary | ICD-10-CM | POA: Diagnosis not present

## 2020-10-24 DIAGNOSIS — R350 Frequency of micturition: Secondary | ICD-10-CM

## 2020-10-24 MED ORDER — ROSUVASTATIN CALCIUM 5 MG PO TABS: 5.0000 mg | ORAL_TABLET | Freq: Every day | ORAL | 1 refills | Status: DC

## 2020-10-24 MED ORDER — DULOXETINE HCL 30 MG PO CPEP: 30.0000 mg | ORAL_CAPSULE | Freq: Every day | ORAL | 3 refills | Status: DC

## 2020-10-24 NOTE — Progress Notes (Signed)
Amy Escobar is a 75 y.o. female with the following history as recorded in EpicCare:  Patient Active Problem List   Diagnosis Date Noted  . Diabetic neuropathy ([place]) 01/10/2020  . Hav (hallux abducto valgus), unspecified laterality 01/10/2020  . Chronic arthropathy 01/10/2020  . 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 edema associated with type 2 diabetes mellitus ([place]) 12/26/2019  . 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 AC (acromioclavicular) arthritis 08/02/2019  . 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 Claudication ([place]) 04/19/2019  . Morbid obesity ([place]) 04/19/2019  . Suspected COVID-19 virus infection 04/13/2019  . Upper airway cough syndrome 08/22/2018  . Greater trochanteric bursitis of both hips 06/15/2018  . Trigger point of shoulder region, left 02/07/2018  . Hypertension   . Mild pulmonary hypertension ([place])   . NSVT (nonsustained ventricular tachycardia) ([place])   . PVC's (premature ventricular contractions)   . URI (upper respiratory infection) 08/09/2016  . Neck pain 02/10/2016  . Urinary incontinence 02/10/2016  . Degenerative arthritis of knee, bilateral 07/17/2015  . Knee MCL sprain 06/07/2015  . Discomfort in chest 02/15/2015  . Chest pain 02/15/2015  . DM type 2, controlled, with complication ([place])   . Wellness examination 08/06/2014  . Acute meniscal tear of knee 02/13/2014  . Primary localized osteoarthrosis, lower leg 02/13/2014  . Gastrocnemius tear 12/25/2013  . UTI (urinary tract infection) 12/20/2013  . Right knee pain 12/20/2013  . Pulmonary hypertension ([place]) 10/06/2013  . DOE (dyspnea on exertion) 08/04/2013  . Dysphagia, unspecified(787.20) 08/10/2012  . Hip pain, left 02/02/2012  . Painful respiration 05/25/2011  . Encounter for long-term (current) use of other medications 01/30/2011  . PELVIC PAIN, LEFT 07/30/2010  . TOBACCO USE, QUIT 07/07/2010  .  FATIGUE 12/20/2009  . Headache 12/20/2009  . Low back pain 12/26/2008  . Disorder of liver 04/13/2008  . FOOT PAIN, BILATERAL 04/13/2008  . FIBROIDS, UTERUS 11/24/2007  . THYROID NODULE, LEFT 11/24/2007  . HYPERCHOLESTEROLEMIA 11/24/2007  . CARPAL TUNNEL SYNDROME, BILATERAL 11/24/2007  . ALKALINE PHOSPHATASE, ELEVATED 11/24/2007  . Gout 08/10/2007  . ANXIETY 08/10/2007  . Essential hypertension 08/10/2007  . Cough 08/01/2007  . Perennial and seasonal allergic rhinitis 01/14/2007  . GERD 01/14/2007  . Osteoporosis 01/14/2007    Current Outpatient Medications  Medication Sig Dispense Refill  . aspirin EC 81 MG tablet Take 81 mg by mouth daily. Swallow whole.    . benzonatate (TESSALON) 100 MG capsule Take 1 capsule by mouth three times a day as needed for cough 45 capsule 2  . calcium elemental as carbonate (ANTACID MAXIMUM) 400 MG chewable tablet Chew 1,000 mg by mouth as needed for heartburn.    . carvedilol (COREG) 12.5 MG tablet Take 1 tablet (12.5 mg total) by mouth 2 (two) times daily with a meal. 180 tablet 2  . Cyanocobalamin (B-12 PO) Take by mouth daily.    Marland Kitchen esomeprazole (NEXIUM) 40 MG capsule TAKE ONE CAPSULE BY MOUTH DAILY 30 TO 60 MINUTES BEFORE YOUR FIRST AND LAST MEALS OF THE DAY 180 capsule 3  . famotidine (PEPCID) 20 MG tablet Take 20 mg by mouth daily.    . fesoterodine (TOVIAZ) 4 MG TB24 tablet Take 4 mg daily by mouth.    Marland Kitchen glucose blood (ONETOUCH VERIO) test strip Use to check blood sugar 2 times a day. 200 each 2  . guaiFENesin (MUCINEX) 600 MG 12 hr tablet Take 600 mg by mouth 2 (two) times daily as needed.    Marland Kitchen  metFORMIN (GLUCOPHAGE-XR) 500 MG 24 hr tablet TAKE TWO TABLETS BY MOUTH TWICE A DAY 360 tablet 0  . montelukast (SINGULAIR) 10 MG tablet Take 1 tablet by mouth daily.    . nitrofurantoin, macrocrystal-monohydrate, (MACROBID) 100 MG capsule Take 1 capsule (100 mg total) by mouth 2 (two) times daily. 14 capsule 0  . Olopatadine HCl 0.2 % SOLN Place 1 drop  into both eyes daily. 2.5 mL 12  . Respiratory Therapy Supplies MISC Use flutter valve as needed to break the coughing cycle. 1 each 1  . valsartan (DIOVAN) 160 MG tablet Take 1 tablet (160 mg total) by mouth daily. 90 tablet 2  . Vitamin D, Ergocalciferol, (DRISDOL) 1.25 MG (50000 UNIT) CAPS capsule TAKE ONE CAPSULE BY MOUTH ONCE WEEKLY 12 capsule 0  . DULoxetine (CYMBALTA) 30 MG capsule Take 1 capsule (30 mg total) by mouth daily. 30 capsule 3  . rosuvastatin (CRESTOR) 5 MG tablet Take 1 tablet (5 mg total) by mouth daily. 90 tablet 1   No current facility-administered medications for this visit.    Allergies: Olmesartan medoxomil  Past Medical History:  Diagnosis Date  . Alkaline phosphatase deficiency    w/u Ne  . Allergic rhinitis   . Anxiety   . Arthritis   . DM2 (diabetes mellitus, type 2) ([place])   . GERD (gastroesophageal reflux disease)   . Gout   . Hemorrhoids   . Hyperlipidemia   . Hypertension   . Mild pulmonary hypertension ([place])   . NSVT (nonsustained ventricular tachycardia) ([place])   . Osteoporosis   . PVC's (premature ventricular contractions)   . Thyroid nodule    small  . Tubular adenoma of colon 2017  . Urticaria   . Uterine fibroid     Past Surgical History:  Procedure Laterality Date  . CATARACT EXTRACTION Bilateral 2016  . COLONOSCOPY  2007  . echocardiogram (other)  01/16/2002  . 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 stress cardiolite  02/12/2006  . TOENAIL EXCISION    . TUBAL LIGATION    . TYMPANOSTOMY TUBE PLACEMENT      Family History  Problem Relation Age of Onset  . Heart disease Mother   . Allergic rhinitis Mother   . Heart disease Father   . Heart disease Maternal Grandfather   . Rectal cancer Maternal Grandfather   . Stomach cancer Maternal Grandmother   . Colon cancer Neg Hx   . Breast cancer Neg Hx     Social History   Tobacco Use  . Smoking status: Former Smoker    Packs/day: 0.25    Years: 5.00    Pack years: 1.25     Types: Cigarettes    Quit date: 06/29/1968    Years since quitting: 52.3  . Smokeless tobacco: Never Used  Substance Use Topics  . Alcohol use: No    Alcohol/week: 0.0 standard drinks    Subjective:  Follow up on UTI- treatment started 2 days ago/ asked to do in office visit to make sure improving; notes feeling better on current regimen;   Would like to review medications- discuss blood pressure; has been feeling better with changes made by cardiology in February; due for OV there in August; dizziness has resolved;     Objective:  Vitals:   10/24/20 0935  BP: 110/70  Pulse: 80  Temp: 98.1 F (36.7 C)  TempSrc: Oral  SpO2: 91%  Weight: 161 lb 12.8 oz (73.4 kg)  Height: 5\' 4"  (1.626 m)  General: Well developed, well nourished, in no acute distress  Skin : Warm and dry.  Head: Normocephalic and atraumatic  Eyes: Sclera and conjunctiva clear; pupils round and reactive to light; extraocular movements intact  Ears: External normal; canals clear; tympanic membranes normal  Oropharynx: Pink, supple. No suspicious lesions  Neck: Supple without thyromegaly, adenopathy  Lungs: Respirations unlabored; clear to auscultation bilaterally without wheeze, rales, rhonchi  CVS exam: normal rate and regular rhythm.  Neurologic: Alert and oriented; speech intact; face symmetrical; moves all extremities well; CNII-XII intact without focal deficit   Assessment:  1. Urinary frequency   2. Primary hypertension   3. DM type 2, controlled, with complication ([place])   4. Diabetic polyneuropathy associated with type 2 diabetes mellitus ([place])     Plan:  1. Suspect UTI- appears to be responding to treatment; check urine culture; 2. Stable; continue same regimen; encouraged to see cardiology in August; 3. Keep planned follow up with endocrinology; refill updated on Crestor; 4. Encouraged to try and take Cymbalta daily to help with symptoms;  This visit occurred during the SARS-CoV-2 public health  emergency.  Safety protocols were in place, including screening questions prior to the visit, additional usage of staff PPE, and extensive cleaning of exam room while observing appropriate contact time as indicated for disinfecting solutions.      Return in about 6 months (around 04/25/2021).  Orders Placed This Encounter  Procedures  . Urine Culture    Requested Prescriptions   Signed Prescriptions Disp Refills  . DULoxetine (CYMBALTA) 30 MG capsule 30 capsule 3    Sig: Take 1 capsule (30 mg total) by mouth daily.  . rosuvastatin (CRESTOR) 5 MG tablet 90 tablet 1    Sig: Take 1 tablet (5 mg total) by mouth daily.

## 2020-10-25 LAB — URINE CULTURE
MICRO NUMBER:: 11826146
Result:: NO GROWTH
SPECIMEN QUALITY:: ADEQUATE

## 2020-11-10 ENCOUNTER — Other Ambulatory Visit: Payer: Self-pay | Admitting: Endocrinology

## 2020-11-11 ENCOUNTER — Ambulatory Visit: Payer: Medicare Other | Admitting: Endocrinology

## 2020-11-17 ENCOUNTER — Other Ambulatory Visit: Payer: Self-pay | Admitting: Family Medicine

## 2020-11-18 ENCOUNTER — Other Ambulatory Visit: Payer: Self-pay

## 2020-11-18 DIAGNOSIS — I10 Essential (primary) hypertension: Secondary | ICD-10-CM

## 2020-11-18 DIAGNOSIS — E782 Mixed hyperlipidemia: Secondary | ICD-10-CM

## 2020-11-18 DIAGNOSIS — I2510 Atherosclerotic heart disease of native coronary artery without angina pectoris: Secondary | ICD-10-CM

## 2020-11-18 MED ORDER — CARVEDILOL 12.5 MG PO TABS: 12.5000 mg | ORAL_TABLET | Freq: Two times a day (BID) | ORAL | 3 refills | Status: DC

## 2020-11-26 ENCOUNTER — Ambulatory Visit (INDEPENDENT_AMBULATORY_CARE_PROVIDER_SITE_OTHER): Payer: Medicare Other | Admitting: Family Medicine

## 2020-11-26 ENCOUNTER — Other Ambulatory Visit: Payer: Self-pay

## 2020-11-26 ENCOUNTER — Encounter: Payer: Self-pay | Admitting: Family Medicine

## 2020-11-26 VITALS — BP 132/80 | HR 95 | Ht 64.0 in | Wt 159.0 lb

## 2020-11-26 DIAGNOSIS — M5416 Radiculopathy, lumbar region: Secondary | ICD-10-CM

## 2020-11-26 DIAGNOSIS — M2550 Pain in unspecified joint: Secondary | ICD-10-CM | POA: Diagnosis not present

## 2020-11-26 DIAGNOSIS — M170 Bilateral primary osteoarthritis of knee: Secondary | ICD-10-CM | POA: Diagnosis not present

## 2020-11-26 DIAGNOSIS — G8929 Other chronic pain: Secondary | ICD-10-CM

## 2020-11-26 DIAGNOSIS — M545 Low back pain, unspecified: Secondary | ICD-10-CM | POA: Diagnosis not present

## 2020-11-26 MED ORDER — METHYLPREDNISOLONE ACETATE 40 MG/ML IJ SUSP
40.0000 mg | Freq: Once | INTRAMUSCULAR | Status: AC
  Administered 2020-11-26: 40 mg via INTRAMUSCULAR

## 2020-11-26 MED ORDER — KETOROLAC TROMETHAMINE 30 MG/ML IJ SOLN
30.0000 mg | Freq: Once | INTRAMUSCULAR | Status: AC
  Administered 2020-11-26: 30 mg via INTRAMUSCULAR

## 2020-11-26 NOTE — Patient Instructions (Addendum)
Always good to see you  2 injections today  epidural ordered today as well- call [phone] to schedule it sooner Hold on knee injections See me again in 4 weeks

## 2020-11-26 NOTE — Progress Notes (Signed)
[facility] [place] Phone: [phone] Subjective:    I'm seeing this patient by the request  of:  [name], FNP  CC: Back pain and knee pain follow-up  [id number]   08/27/2020 Patient given repeat injections.  Patient feels that the viscosupplementation did not last long term.  Patient continues to have pain.  We will repeat x-rays to further evaluate the amount of arthritic changes.  Patient has had difficulty with vitamin D and will get laboratory work-up for that.  Discussed with patient to continue to stay active where possible.  Patient can follow-up again in 2 to 3 months otherwise.  Patient feels that the viscosupplementation likely is not helping as much anymore.  I do both think that this still is a possibility long-term for her if she wants to avoid the surgical intervention.  Update 11/26/2020 Amy Escobar is a 75 y.o. female coming in with complaint of B knee pain. Patient states that her knee pain has increased.   Patient is having increase in back pain. Epidural in September 2021. Using [place] patches for pain relief.       Past Medical History:  Diagnosis Date  . Alkaline phosphatase deficiency    w/u Ne  . Allergic rhinitis   . Anxiety   . Arthritis   . DM2 (diabetes mellitus, type 2) ([place])   . GERD (gastroesophageal reflux disease)   . Gout   . Hemorrhoids   . Hyperlipidemia   . Hypertension   . Mild pulmonary hypertension ([place])   . NSVT (nonsustained ventricular tachycardia) ([place])   . Osteoporosis   . PVC's (premature ventricular contractions)   . Thyroid nodule    small  . Tubular adenoma of colon 2017  . Urticaria   . Uterine fibroid    Past Surgical History:  Procedure Laterality Date  . CATARACT EXTRACTION Bilateral 2016  . COLONOSCOPY  2007  . echocardiogram (other)  01/16/2002  . 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 stress cardiolite  02/12/2006  .  TOENAIL EXCISION    . TUBAL LIGATION    . TYMPANOSTOMY TUBE PLACEMENT     Social History   Socioeconomic History  . Marital status: Widowed    Spouse name: Not on file  . Number of children: 2  . Years of education: Not on file  . Highest education level: Not on file  Occupational History  . Occupation: Surveyor, minerals: UNEMPLOYED  Tobacco Use  . Smoking status: Former Smoker    Packs/day: 0.25    Years: 5.00    Pack years: 1.25    Types: Cigarettes    Quit date: 06/29/1968    Years since quitting: 52.4  . Smokeless tobacco: Never Used  Vaping Use  . Vaping Use: Never used  Substance and Sexual Activity  . Alcohol use: No    Alcohol/week: 0.0 standard drinks  . Drug use: No  . Sexual activity: Not on file  Other Topics Concern  . Not on file  Social History Narrative   Widowed 2010.    Social Determinants of Health   Financial Resource Strain: Low Risk   . Difficulty of Paying Living Expenses: Not hard at all  Food Insecurity: No Food Insecurity  . Worried About Charity fundraiser in the Last Year: Never true  . Ran Out of Food in the Last Year: Never true  Transportation Needs: No Transportation Needs  .  Lack of Transportation (Medical): No  . Lack of Transportation (Non-Medical): No  Physical Activity: Sufficiently Active  . Days of Exercise per Week: 5 days  . Minutes of Exercise per Session: 30 min  Stress: No Stress Concern Present  . Feeling of Stress : Not at all  Social Connections: Moderately Integrated  . Frequency of Communication with Friends and Family: More than three times a week  . Frequency of Social Gatherings with Friends and Family: Once a week  . Attends Religious Services: More than 4 times per year  . Active Member of Clubs or Organizations: No  . Attends Archivist Meetings: More than 4 times per year  . Marital Status: Widowed   Allergies  Allergen Reactions  . Olmesartan Medoxomil     REACTION: headache   Family  History  Problem Relation Age of Onset  . Heart disease Mother   . Allergic rhinitis Mother   . Heart disease Father   . Heart disease Maternal Grandfather   . Rectal cancer Maternal Grandfather   . Stomach cancer Maternal Grandmother   . Colon cancer Neg Hx   . Breast cancer Neg Hx     Current Outpatient Medications (Endocrine & Metabolic):  .  metFORMIN (GLUCOPHAGE-XR) 500 MG 24 hr tablet, TAKE TWO TABLETS BY MOUTH TWICE A DAY  Current Outpatient Medications (Cardiovascular):  .  carvedilol (COREG) 12.5 MG tablet, Take 1 tablet (12.5 mg total) by mouth 2 (two) times daily with a meal. .  rosuvastatin (CRESTOR) 5 MG tablet, Take 1 tablet (5 mg total) by mouth daily. .  valsartan (DIOVAN) 160 MG tablet, Take 1 tablet (160 mg total) by mouth daily.  Current Outpatient Medications (Respiratory):  .  benzonatate (TESSALON) 100 MG capsule, Take 1 capsule by mouth three times a day as needed for cough .  guaiFENesin (MUCINEX) 600 MG 12 hr tablet, Take 600 mg by mouth 2 (two) times daily as needed. .  montelukast (SINGULAIR) 10 MG tablet, Take 1 tablet by mouth daily.  Current Outpatient Medications (Analgesics):  .  aspirin EC 81 MG tablet, Take 81 mg by mouth daily. Swallow whole.  Current Outpatient Medications (Hematological):  Marland Kitchen  Cyanocobalamin (B-12 PO), Take by mouth daily.  Current Outpatient Medications (Other):  .  Blood Glucose Monitoring Suppl (ONETOUCH VERIO FLEX SYSTEM) w/Device KIT, USE TO TEST BLOOD SUGAR DAILY AS DIRECTED .  calcium elemental as carbonate (ANTACID MAXIMUM) 400 MG chewable tablet, Chew 1,000 mg by mouth as needed for heartburn. .  DULoxetine (CYMBALTA) 30 MG capsule, Take 1 capsule (30 mg total) by mouth daily. Marland Kitchen  esomeprazole (NEXIUM) 40 MG capsule, TAKE ONE CAPSULE BY MOUTH DAILY 30 TO 60 MINUTES BEFORE YOUR FIRST AND LAST MEALS OF THE DAY .  famotidine (PEPCID) 20 MG tablet, Take 20 mg by mouth daily. .  fesoterodine (TOVIAZ) 4 MG TB24 tablet, Take 4  mg daily by mouth. Marland Kitchen  glucose blood (ONETOUCH VERIO) test strip, Use to check blood sugar 2 times a day. .  nitrofurantoin, macrocrystal-monohydrate, (MACROBID) 100 MG capsule, Take 1 capsule (100 mg total) by mouth 2 (two) times daily. .  Olopatadine HCl 0.2 % SOLN, Place 1 drop into both eyes daily. Marland Kitchen  Respiratory Therapy Supplies MISC, Use flutter valve as needed to break the coughing cycle. .  Vitamin D, Ergocalciferol, (DRISDOL) 1.25 MG (50000 UNIT) CAPS capsule, TAKE ONE CAPSULE BY MOUTH ONCE WEEKLY   Reviewed prior external information including notes and imaging from  primary care  provider As well as notes that were available from care everywhere and other healthcare systems.  Past medical history, social, surgical and family history all reviewed in electronic medical record.  No pertanent information unless stated regarding to the chief complaint.   Review of Systems:  No headache, visual changes, nausea, vomiting, diarrhea, constipation, dizziness, abdominal pain, skin rash, fevers, chills, night sweats, weight loss, swollen lymph nodes, body aches, joint swelling, chest pain, shortness of breath, mood changes. POSITIVE muscle aches  Objective  Blood pressure 132/80, pulse 95, height _0  (1.626 m), weight 159 lb (72.1 kg), SpO2 96 %.   General: No apparent distress alert and oriented x3 mood and affect normal, dressed appropriately.  HEENT: Pupils equal, extraocular movements intact  Respiratory: Patient's speak in full sentences and does not appear short of breath  Cardiovascular: No lower extremity edema, non tender, no erythema  Gait antalgic  MSK: Low back exam does have significant discomfort noted in the paraspinal musculature right greater than left.  Patient does have loss of lordosis.  Patient is some pain with flexion and extension of the back.  Even pain to light palpation of the sacroiliac joint in the gluteal area bilaterally.  Knee exam does show that patient does  have arthritic changes in the knees bilaterally.      Impression and Recommendations:     .;zend The above documentation has been reviewed and is accurate and complete Lyndal Pulley, DO

## 2020-11-26 NOTE — Progress Notes (Deleted)
[facility] [place] Phone: [phone] Subjective:    I'm seeing this patient by the request  of:  [name], FNP  CC:   [id number]   08/27/2020 Patient given repeat injections.  Patient feels that the viscosupplementation did not last long term.  Patient continues to have pain.  We will repeat x-rays to further evaluate the amount of arthritic changes.  Patient has had difficulty with vitamin D and will get laboratory work-up for that.  Discussed with patient to continue to stay active where possible.  Patient can follow-up again in 2 to 3 months otherwise.  Patient feels that the viscosupplementation likely is not helping as much anymore.  I do both think that this still is a possibility long-term for her if she wants to avoid the surgical intervention.  Update 11/27/2020 Amy Escobar is a 75 y.o. female coming in with complaint of B knee pain.   Onset-  Location Duration-  Character- Aggravating factors- Reliving factors-  Therapies tried-  Severity-     Past Medical History:  Diagnosis Date  . Alkaline phosphatase deficiency    w/u Ne  . Allergic rhinitis   . Anxiety   . Arthritis   . DM2 (diabetes mellitus, type 2) ([place])   . GERD (gastroesophageal reflux disease)   . Gout   . Hemorrhoids   . Hyperlipidemia   . Hypertension   . Mild pulmonary hypertension ([place])   . NSVT (nonsustained ventricular tachycardia) ([place])   . Osteoporosis   . PVC's (premature ventricular contractions)   . Thyroid nodule    small  . Tubular adenoma of colon 2017  . Urticaria   . Uterine fibroid    Past Surgical History:  Procedure Laterality Date  . CATARACT EXTRACTION Bilateral 2016  . COLONOSCOPY  2007  . echocardiogram (other)  01/16/2002  . 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 stress cardiolite  02/12/2006  . TOENAIL EXCISION    . TUBAL LIGATION    . TYMPANOSTOMY TUBE PLACEMENT     Social  History   Socioeconomic History  . Marital status: Widowed    Spouse name: Not on file  . Number of children: 2  . Years of education: Not on file  . Highest education level: Not on file  Occupational History  . Occupation: Surveyor, minerals: UNEMPLOYED  Tobacco Use  . Smoking status: Former Smoker    Packs/day: 0.25    Years: 5.00    Pack years: 1.25    Types: Cigarettes    Quit date: 06/29/1968    Years since quitting: 52.4  . Smokeless tobacco: Never Used  Vaping Use  . Vaping Use: Never used  Substance and Sexual Activity  . Alcohol use: No    Alcohol/week: 0.0 standard drinks  . Drug use: No  . Sexual activity: Not on file  Other Topics Concern  . Not on file  Social History Narrative   Widowed 2010.    Social Determinants of Health   Financial Resource Strain: Low Risk   . Difficulty of Paying Living Expenses: Not hard at all  Food Insecurity: No Food Insecurity  . Worried About Charity fundraiser in the Last Year: Never true  . Ran Out of Food in the Last Year: Never true  Transportation Needs: No Transportation Needs  . Lack of Transportation (Medical): No  . Lack of Transportation (Non-Medical): No  Physical Activity:  Sufficiently Active  . Days of Exercise per Week: 5 days  . Minutes of Exercise per Session: 30 min  Stress: No Stress Concern Present  . Feeling of Stress : Not at all  Social Connections: Moderately Integrated  . Frequency of Communication with Friends and Family: More than three times a week  . Frequency of Social Gatherings with Friends and Family: Once a week  . Attends Religious Services: More than 4 times per year  . Active Member of Clubs or Organizations: No  . Attends Archivist Meetings: More than 4 times per year  . Marital Status: Widowed   Allergies  Allergen Reactions  . Olmesartan Medoxomil     REACTION: headache   Family History  Problem Relation Age of Onset  . Heart disease Mother   . Allergic  rhinitis Mother   . Heart disease Father   . Heart disease Maternal Grandfather   . Rectal cancer Maternal Grandfather   . Stomach cancer Maternal Grandmother   . Colon cancer Neg Hx   . Breast cancer Neg Hx     Current Outpatient Medications (Endocrine & Metabolic):  .  metFORMIN (GLUCOPHAGE-XR) 500 MG 24 hr tablet, TAKE TWO TABLETS BY MOUTH TWICE A DAY  Current Outpatient Medications (Cardiovascular):  .  carvedilol (COREG) 12.5 MG tablet, Take 1 tablet (12.5 mg total) by mouth 2 (two) times daily with a meal. .  rosuvastatin (CRESTOR) 5 MG tablet, Take 1 tablet (5 mg total) by mouth daily. .  valsartan (DIOVAN) 160 MG tablet, Take 1 tablet (160 mg total) by mouth daily.  Current Outpatient Medications (Respiratory):  .  benzonatate (TESSALON) 100 MG capsule, Take 1 capsule by mouth three times a day as needed for cough .  guaiFENesin (MUCINEX) 600 MG 12 hr tablet, Take 600 mg by mouth 2 (two) times daily as needed. .  montelukast (SINGULAIR) 10 MG tablet, Take 1 tablet by mouth daily.  Current Outpatient Medications (Analgesics):  .  aspirin EC 81 MG tablet, Take 81 mg by mouth daily. Swallow whole.  Current Outpatient Medications (Hematological):  Marland Kitchen  Cyanocobalamin (B-12 PO), Take by mouth daily.  Current Outpatient Medications (Other):  .  Blood Glucose Monitoring Suppl (ONETOUCH VERIO FLEX SYSTEM) w/Device KIT, USE TO TEST BLOOD SUGAR DAILY AS DIRECTED .  calcium elemental as carbonate (ANTACID MAXIMUM) 400 MG chewable tablet, Chew 1,000 mg by mouth as needed for heartburn. .  DULoxetine (CYMBALTA) 30 MG capsule, Take 1 capsule (30 mg total) by mouth daily. Marland Kitchen  esomeprazole (NEXIUM) 40 MG capsule, TAKE ONE CAPSULE BY MOUTH DAILY 30 TO 60 MINUTES BEFORE YOUR FIRST AND LAST MEALS OF THE DAY .  famotidine (PEPCID) 20 MG tablet, Take 20 mg by mouth daily. .  fesoterodine (TOVIAZ) 4 MG TB24 tablet, Take 4 mg daily by mouth. Marland Kitchen  glucose blood (ONETOUCH VERIO) test strip, Use to check  blood sugar 2 times a day. .  nitrofurantoin, macrocrystal-monohydrate, (MACROBID) 100 MG capsule, Take 1 capsule (100 mg total) by mouth 2 (two) times daily. .  Olopatadine HCl 0.2 % SOLN, Place 1 drop into both eyes daily. Marland Kitchen  Respiratory Therapy Supplies MISC, Use flutter valve as needed to break the coughing cycle. .  Vitamin D, Ergocalciferol, (DRISDOL) 1.25 MG (50000 UNIT) CAPS capsule, TAKE ONE CAPSULE BY MOUTH ONCE WEEKLY   Reviewed prior external information including notes and imaging from  primary care provider As well as notes that were available from care everywhere and other healthcare systems.  Past medical history, social, surgical and family history all reviewed in electronic medical record.  No pertanent information unless stated regarding to the chief complaint.   Review of Systems:  No headache, visual changes, nausea, vomiting, diarrhea, constipation, dizziness, abdominal pain, skin rash, fevers, chills, night sweats, weight loss, swollen lymph nodes, body aches, joint swelling, chest pain, shortness of breath, mood changes. POSITIVE muscle aches  Objective  There were no vitals taken for this visit.   General: No apparent distress alert and oriented x3 mood and affect normal, dressed appropriately.  HEENT: Pupils equal, extraocular movements intact  Respiratory: Patient's speak in full sentences and does not appear short of breath  Cardiovascular: No lower extremity edema, non tender, no erythema  Gait normal with good balance and coordination.  MSK:  Non tender with full range of motion and good stability and symmetric strength and tone of shoulders, elbows, wrist, hip, knee and ankles bilaterally.     Impression and Recommendations:     The above documentation has been reviewed and is accurate and complete Jacqualin Combes

## 2020-11-26 NOTE — Assessment & Plan Note (Signed)
Chronic problem with worsening symptoms.  Toradol and Depo-Medrol given today.  Warned of potential side effects.  Patient has responded well to them previously though.  Patient will be set up for having a possibility of a another epidural.  Last epidural in the back was in September of last year.  Patient did respond fairly well to it.  Hopefully patient will continue to respond to them.  Patient will follow up with me again in 4 weeks.  Does have known arthritic changes of the knees and we will discussed the potential for injections if necessary at follow-up.

## 2020-11-26 NOTE — Assessment & Plan Note (Signed)
Patient was having more pain of the knees recently.  Having more pain of the back as well decided on the Toradol and Depo-Medrol but will consider repeating the steroid injection again at next follow-up.

## 2020-11-27 ENCOUNTER — Ambulatory Visit: Payer: Medicare Other | Admitting: Family Medicine

## 2020-11-28 ENCOUNTER — Telehealth: Payer: Self-pay | Admitting: Podiatry

## 2020-11-28 NOTE — Telephone Encounter (Signed)
Pt message checking on status of diabetic shoes for herself and her mother.  I returned call and left message that Dr Loanne Drilling stated pt does not qualify for diabetic shoes and as far as her mom they should be shipping soon and to call if any further questions.

## 2020-12-02 ENCOUNTER — Other Ambulatory Visit: Payer: Self-pay | Admitting: Family

## 2020-12-02 ENCOUNTER — Encounter: Payer: Self-pay | Admitting: Podiatry

## 2020-12-02 ENCOUNTER — Other Ambulatory Visit: Payer: Self-pay

## 2020-12-02 ENCOUNTER — Ambulatory Visit (INDEPENDENT_AMBULATORY_CARE_PROVIDER_SITE_OTHER): Payer: Medicare Other | Admitting: Podiatry

## 2020-12-02 DIAGNOSIS — E1142 Type 2 diabetes mellitus with diabetic polyneuropathy: Secondary | ICD-10-CM | POA: Diagnosis not present

## 2020-12-02 DIAGNOSIS — Z1231 Encounter for screening mammogram for malignant neoplasm of breast: Secondary | ICD-10-CM

## 2020-12-02 NOTE — Progress Notes (Signed)
This patient returns to my office for at risk foot care.  This patient requires this care by a professional since this patient will be at risk due to having diabetic neuropathy.  She says she is experiencing burning into her toes especially during sleep.  She says she has told her doctor this problem and he has not treated according to this patient.  This patient presents for at risk foot care today.  General Appearance  Alert, conversant and in no acute stress.  Vascular  Dorsalis pedis and posterior tibial  pulses are palpable  bilaterally.  Capillary return is within normal limits  bilaterally. Temperature is within normal limits  bilaterally.  Neurologic  Senn-Weinstein monofilament wire test within normal limits  bilaterally. Muscle power within normal limits bilaterally.  Nails Thick disfigured discolored nails with subungual debris  from hallux to fifth toes bilaterally. No evidence of bacterial infection or drainage bilaterally.  Orthopedic  No limitations of motion  feet .  No crepitus or effusions noted.  No bony pathology or digital deformities noted.  HAV  B/L.  Skin  normotropic skin with no porokeratosis noted bilaterally.  No signs of infections or ulcers noted.     Diabetic neuropathy.  Consent was obtained for treatment procedures.   Discussd her neuropathy with this patient and gave her the names of medicine.  Checked on her diabetic shoes.   Return office visit   prn                  Told patient to return for periodic foot care and evaluation due to potential at risk complications.   Gardiner Barefoot DPM

## 2020-12-03 ENCOUNTER — Ambulatory Visit
Admission: RE | Admit: 2020-12-03 | Discharge: 2020-12-03 | Disposition: A | Discharge status: Home / Self Care | Payer: Medicare Other | Source: Ambulatory Visit | Attending: Family Medicine | Admitting: Family Medicine

## 2020-12-03 DIAGNOSIS — M5416 Radiculopathy, lumbar region: Secondary | ICD-10-CM

## 2020-12-03 DIAGNOSIS — M545 Low back pain, unspecified: Secondary | ICD-10-CM | POA: Diagnosis not present

## 2020-12-03 MED ORDER — METHYLPREDNISOLONE ACETATE 40 MG/ML INJ SUSP (RADIOLOG
80.0000 mg | Freq: Once | INTRAMUSCULAR | Status: AC
  Administered 2020-12-03: 80 mg via EPIDURAL

## 2020-12-03 MED ORDER — IOPAMIDOL (ISOVUE-M 200) INJECTION 41%
1.0000 mL | Freq: Once | INTRAMUSCULAR | Status: AC
  Administered 2020-12-03: 1 mL via EPIDURAL

## 2020-12-03 NOTE — Discharge Instructions (Signed)
Post Procedure Spinal Discharge Instruction Sheet  1. You may resume a regular diet and any medications that you routinely take (including pain medications).  2. No driving day of procedure.  3. Light activity throughout the rest of the day.  Do not do any strenuous work, exercise, bending or lifting.  The day following the procedure, you can resume normal physical activity but you should refrain from exercising or physical therapy for at least three days thereafter.   Common Side Effects:   Headaches- take your usual medications as directed by your physician.  Increase your fluid intake.  Caffeinated beverages may be helpful.  Lie flat in bed until your headache resolves.   Restlessness or inability to sleep- you may have trouble sleeping for the next few days.  Ask your referring physician if you need any medication for sleep.   Facial flushing or redness- should subside within a few days.   Increased pain- a temporary increase in pain a day or two following your procedure is not unusual.  Take your pain medication as prescribed by your referring physician.   Leg cramps  Please contact our office at [phone] for the following symptoms:  Fever greater than 100 degrees.  Headaches unresolved with medication after 2-3 days.  Increased swelling, pain, or redness at injection site.   Thank you for visiting [facility] today.   YOU MAY RESUME YOUR ASPIRIN ANYTIME TODAY AFTER INJECTION

## 2020-12-05 ENCOUNTER — Ambulatory Visit: Payer: Medicare Other

## 2020-12-12 ENCOUNTER — Other Ambulatory Visit: Payer: Self-pay | Admitting: Internal Medicine

## 2020-12-24 NOTE — Progress Notes (Deleted)
[address] [address] [place] Phone: [phone] Subjective:    I'm seeing this patient by the request  of:  [name], FNP  CC:   [id number]  11/26/2020 Patient was having more pain of the knees recently.  Having more pain of the back as well decided on the Toradol and Depo-Medrol but will consider repeating the steroid injection again at next follow-up.  Update 12/25/2020 [name] is a 75 y.o. female coming in with complaint of back and B knee pain. Patient states   Onset-  Location Duration-  Character- Aggravating factors- Reliving factors-  Therapies tried-  Severity-     Past Medical History:  Diagnosis Date   Alkaline phosphatase deficiency    w/u Ne   Allergic rhinitis    Anxiety    Arthritis    DM2 (diabetes mellitus, type 2) (HCC)    GERD (gastroesophageal reflux disease)    Gout    Hemorrhoids    Hyperlipidemia    Hypertension    Mild pulmonary hypertension (HCC)    NSVT (nonsustained ventricular tachycardia) (HCC)    Osteoporosis    PVC's (premature ventricular contractions)    Thyroid nodule    small   Tubular adenoma of colon 2017   Urticaria    Uterine fibroid    Past Surgical History:  Procedure Laterality Date   CATARACT EXTRACTION Bilateral 2016   COLONOSCOPY  2007   echocardiogram (other)  01/16/2002   removed tumors from foot nerves  04/1999   stress cardiolite  02/12/2006   TOENAIL EXCISION     TUBAL LIGATION     TYMPANOSTOMY TUBE PLACEMENT     Social History   Socioeconomic History   Marital status: Widowed    Spouse name: Not on file   Number of children: 2   Years of education: Not on file   Highest education level: Not on file  Occupational History   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    Social Determinants of Health   Financial Resource Strain: Low Risk    Difficulty of Paying Living Expenses: Not hard at all  Food Insecurity: No Food Insecurity   Worried About Charity fundraiser in the Last Year: Never true   [place] in the Last Year: Never true  Transportation Needs: No Transportation Needs   Lack of Transportation (Medical): No   Lack of Transportation (Non-Medical): No  Physical Activity: Sufficiently Active   Days of Exercise per Week: 5 days   Minutes of Exercise per Session: 30 min  Stress: No Stress Concern Present   Feeling of Stress : Not at all  Social Connections: Moderately Integrated   Frequency of Communication with Friends and Family: More than three times a week   Frequency of Social Gatherings with Friends and Family: Once a week  Attends Religious Services: More than 4 times per year   Active Member of Clubs or Organizations: No   Attends Archivist Meetings: More than 4 times per year   Marital Status: Widowed   Allergies  Allergen Reactions   Olmesartan Medoxomil     REACTION: headache   Family History  Problem Relation Age of Onset   Heart disease Mother    Allergic rhinitis Mother    Heart disease Father    Heart disease Maternal Grandfather    Rectal cancer Maternal Grandfather    Stomach cancer Maternal Grandmother    Colon cancer Neg Hx    Breast cancer Neg Hx     Current Outpatient Medications (Endocrine & Metabolic):    metFORMIN (GLUCOPHAGE-XR) 500 MG 24 hr tablet, TAKE TWO TABLETS BY MOUTH TWICE A DAY  Current Outpatient Medications (Cardiovascular):    carvedilol (COREG) 12.5 MG tablet, Take 1  tablet (12.5 mg total) by mouth 2 (two) times daily with a meal.   rosuvastatin (CRESTOR) 5 MG tablet, Take 1 tablet (5 mg total) by mouth daily.   valsartan (DIOVAN) 160 MG tablet, Take 1 tablet (160 mg total) by mouth daily.  Current Outpatient Medications (Respiratory):    benzonatate (TESSALON) 100 MG capsule, TAKE ONE CAPSULE BY MOUTH THREE TIMES A DAY AS NEEDED FOR COUGH   guaiFENesin (MUCINEX) 600 MG 12 hr tablet, Take 600 mg by mouth 2 (two) times daily as needed.   montelukast (SINGULAIR) 10 MG tablet, Take 1 tablet by mouth daily.  Current Outpatient Medications (Analgesics):    aspirin EC 81 MG tablet, Take 81 mg by mouth daily. Swallow whole.  Current Outpatient Medications (Hematological):    Cyanocobalamin (B-12 PO), Take by mouth daily.  Current Outpatient Medications (Other):    Blood Glucose Monitoring Suppl (ONETOUCH VERIO FLEX SYSTEM) w/Device KIT, USE TO TEST BLOOD SUGAR DAILY AS DIRECTED   calcium elemental as carbonate (ANTACID MAXIMUM) 400 MG chewable tablet, Chew 1,000 mg by mouth as needed for heartburn.   DULoxetine (CYMBALTA) 30 MG capsule, Take 1 capsule (30 mg total) by mouth daily.   esomeprazole (NEXIUM) 40 MG capsule, TAKE ONE CAPSULE BY MOUTH DAILY 30 TO 60 MINUTES BEFORE YOUR FIRST AND LAST MEALS OF THE DAY   famotidine (PEPCID) 20 MG tablet, Take 20 mg by mouth daily.   fesoterodine (TOVIAZ) 4 MG TB24 tablet, Take 4 mg daily by mouth.   glucose blood (ONETOUCH VERIO) test strip, Use to check blood sugar 2 times a day.   nitrofurantoin, macrocrystal-monohydrate, (MACROBID) 100 MG capsule, Take 1 capsule (100 mg total) by mouth 2 (two) times daily.   Olopatadine HCl 0.2 % SOLN, Place 1 drop into both eyes daily.   Respiratory Therapy Supplies MISC, Use flutter valve as needed to break the coughing cycle.   Vitamin D, Ergocalciferol, (DRISDOL) 1.25 MG (50000 UNIT) CAPS capsule, TAKE ONE CAPSULE BY MOUTH ONCE WEEKLY   Reviewed prior external information  including notes and imaging from  primary care provider As well as notes that were available from care everywhere and other healthcare systems.  Past medical history, social, surgical and family history all reviewed in electronic medical record.  No pertanent information unless stated regarding to the chief complaint.   Review of Systems:  No headache, visual changes, nausea, vomiting, diarrhea, constipation, dizziness, abdominal pain, skin rash, fevers, chills, night sweats, weight loss, swollen lymph nodes, body aches, joint swelling, chest pain, shortness of breath, mood changes. POSITIVE muscle aches  Objective  There were no vitals taken for this visit.   General: No apparent distress alert and oriented x3 mood and affect normal, dressed appropriately.  HEENT: Pupils equal, extraocular movements intact  Respiratory: Patient's speak in full sentences and does not appear short of breath  Cardiovascular: No lower extremity edema, non tender, no erythema  Gait normal with good balance and coordination.  MSK:  Non tender with full range of motion and good stability and symmetric strength and tone of shoulders, elbows, wrist, hip, knee and ankles bilaterally.     Impression and Recommendations:     The above documentation has been reviewed and is accurate and complete Amy Escobar

## 2020-12-25 ENCOUNTER — Ambulatory Visit: Payer: Medicare Other | Admitting: Family Medicine

## 2020-12-25 ENCOUNTER — Ambulatory Visit: Payer: Medicare Other | Admitting: Endocrinology

## 2020-12-26 ENCOUNTER — Ambulatory Visit (INDEPENDENT_AMBULATORY_CARE_PROVIDER_SITE_OTHER): Payer: Medicare Other | Admitting: Endocrinology

## 2020-12-26 ENCOUNTER — Other Ambulatory Visit: Payer: Self-pay

## 2020-12-26 VITALS — BP 124/86 | HR 91 | Ht 64.0 in | Wt 157.0 lb

## 2020-12-26 DIAGNOSIS — M79671 Pain in right foot: Secondary | ICD-10-CM | POA: Diagnosis not present

## 2020-12-26 DIAGNOSIS — M79672 Pain in left foot: Secondary | ICD-10-CM

## 2020-12-26 DIAGNOSIS — E118 Type 2 diabetes mellitus with unspecified complications: Secondary | ICD-10-CM

## 2020-12-26 LAB — POCT GLYCOSYLATED HEMOGLOBIN (HGB A1C): Hemoglobin A1C: 5.7 % — AB (ref 4.0–5.6)

## 2020-12-26 NOTE — Progress Notes (Signed)
Subjective:    Patient ID: Amy Escobar, female    DOB: [birth year], 75 y.o.   MRN: [id number]  HPI Pt returns for f/u of diabetes mellitus:  DM type: 2 Dx'ed: 2446 Complications: CAD and DR. Therapy: metformin.   GDM: never.   DKA: never.   Severe hypoglycemia: never.   Pancreatitis: never.  Other: she has never been on insulin; she gets occasional steroid injections into the knees; she did not tolerate Invokana (vaginitis).   Interval history: pt states she takes metformin as rx'ed.  Main symptom is bilat foot pain at rest.    Past Medical History:  Diagnosis Date   Alkaline phosphatase deficiency    w/u Ne   Allergic rhinitis    Anxiety    Arthritis    DM2 (diabetes mellitus, type 2) (HCC)    GERD (gastroesophageal reflux disease)    Gout    Hemorrhoids    Hyperlipidemia    Hypertension    Mild pulmonary hypertension (HCC)    NSVT (nonsustained ventricular tachycardia) (HCC)    Osteoporosis    PVC's (premature ventricular contractions)    Thyroid nodule    small   Tubular adenoma of colon 2017   Urticaria    Uterine fibroid     Past Surgical History:  Procedure Laterality Date   CATARACT EXTRACTION Bilateral 2016   COLONOSCOPY  2007   echocardiogram (other)  01/16/2002   removed tumors from foot nerves  04/1999   stress cardiolite  02/12/2006   TOENAIL EXCISION     TUBAL LIGATION     TYMPANOSTOMY TUBE PLACEMENT      Social History   Socioeconomic History   Marital status: Widowed    Spouse name: Not on file   Number of children: 2   Years of education: Not on file   Highest education level: Not on file  Occupational History   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    Social Determinants of Health   Financial Resource Strain: Low Risk    Difficulty of Paying Living Expenses: Not hard at all  Food Insecurity: No Food Insecurity   Worried About Charity fundraiser in the Last Year: Never true   [place] in the Last Year: Never true  Transportation Needs: No Transportation Needs   Lack of Transportation (Medical): No   Lack of Transportation (Non-Medical): No  Physical Activity: Sufficiently Active   Days of Exercise per Week: 5 days   Minutes of Exercise per Session: 30 min  Stress: No Stress Concern Present   Feeling of Stress : Not at all  Social Connections: Moderately Integrated   Frequency of Communication with Friends and Family: More than three times a week   Frequency of Social Gatherings with Friends and Family: Once a week   Attends Religious Services: More than 4 times per year   Active  Member of Clubs or Organizations: No   Attends Music therapist: More than 4 times per year   Marital Status: Widowed  Intimate Partner Violence: Not on file    Current Outpatient Medications on File Prior to Visit  Medication Sig Dispense Refill   aspirin EC 81 MG tablet Take 81 mg by mouth daily. Swallow whole.     benzonatate (TESSALON) 100 MG capsule TAKE ONE CAPSULE BY MOUTH THREE TIMES A DAY AS NEEDED FOR COUGH 45 capsule 2   Blood Glucose Monitoring Suppl (ONETOUCH VERIO FLEX SYSTEM) w/Device KIT USE TO TEST BLOOD SUGAR DAILY AS DIRECTED 1 kit 0   calcium elemental as carbonate (ANTACID MAXIMUM) 400 MG chewable tablet Chew 1,000 mg by mouth as needed for heartburn.     carvedilol (COREG) 12.5 MG tablet Take 1 tablet (12.5 mg total) by mouth 2 (two) times daily with a meal. 180 tablet 3   Cyanocobalamin (B-12 PO) Take by mouth daily.     DULoxetine  (CYMBALTA) 30 MG capsule Take 1 capsule (30 mg total) by mouth daily. 30 capsule 3   esomeprazole (NEXIUM) 40 MG capsule TAKE ONE CAPSULE BY MOUTH DAILY 30 TO 60 MINUTES BEFORE YOUR FIRST AND LAST MEALS OF THE DAY 180 capsule 3   famotidine (PEPCID) 20 MG tablet Take 20 mg by mouth daily.     fesoterodine (TOVIAZ) 4 MG TB24 tablet Take 4 mg daily by mouth.     glucose blood (ONETOUCH VERIO) test strip Use to check blood sugar 2 times a day. 200 each 2   guaiFENesin (MUCINEX) 600 MG 12 hr tablet Take 600 mg by mouth 2 (two) times daily as needed.     metFORMIN (GLUCOPHAGE-XR) 500 MG 24 hr tablet TAKE TWO TABLETS BY MOUTH TWICE A DAY 360 tablet 0   montelukast (SINGULAIR) 10 MG tablet Take 1 tablet by mouth daily.     nitrofurantoin, macrocrystal-monohydrate, (MACROBID) 100 MG capsule Take 1 capsule (100 mg total) by mouth 2 (two) times daily. 14 capsule 0   Olopatadine HCl 0.2 % SOLN Place 1 drop into both eyes daily. 2.5 mL 12   Respiratory Therapy Supplies MISC Use flutter valve as needed to break the coughing cycle. 1 each 1   rosuvastatin (CRESTOR) 5 MG tablet Take 1 tablet (5 mg total) by mouth daily. 90 tablet 1   valsartan (DIOVAN) 160 MG tablet Take 1 tablet (160 mg total) by mouth daily. 90 tablet 2   Vitamin D, Ergocalciferol, (DRISDOL) 1.25 MG (50000 UNIT) CAPS capsule TAKE ONE CAPSULE BY MOUTH ONCE WEEKLY 12 capsule 0   No current facility-administered medications on file prior to visit.    Allergies  Allergen Reactions   Olmesartan Medoxomil     REACTION: headache    Family History  Problem Relation Age of Onset   Heart disease Mother    Allergic rhinitis Mother    Heart disease Father    Heart disease Maternal Grandfather    Rectal cancer Maternal Grandfather    Stomach cancer Maternal Grandmother    Colon cancer Neg Hx    Breast cancer Neg Hx     BP 124/86 (BP Location: Right Arm, Patient Position: Sitting, Cuff Size: Normal)   Pulse 91   Ht '5\' 4"'  (1.626 m)   Wt  157 lb (71.2 kg)   SpO2 97%   BMI 26.95 kg/m    Review of Systems     Objective:   Physical Exam Pulses: dorsalis pedis intact bilat.  MSK: no deformity of the feet CV: no leg edema.   Skin:  no ulcer on the feet.  normal color and temp on the feet.   Neuro: sensation is intact to touch on the feet, but decreased from normal.   EXT: both great toenails are absent.    Lab Results  Component Value Date   HGBA1C 5.7 (A) 12/26/2020       Assessment & Plan:  Type 2 DM: well-controlled Foot pain, uncertain etiology and prognosis.  Patient Instructions  Please continue the same metformin.   Let's recheck the circulation.  you will receive a phone call, about a day and time for an appointment.   Please come back for a follow-up appointment in 6 months.

## 2020-12-26 NOTE — Patient Instructions (Addendum)
Please continue the same metformin.   Let's recheck the circulation.  you will receive a phone call, about a day and time for an appointment.   Please come back for a follow-up appointment in 6 months.

## 2021-01-10 ENCOUNTER — Other Ambulatory Visit: Payer: Self-pay | Admitting: Endocrinology

## 2021-01-10 ENCOUNTER — Other Ambulatory Visit: Payer: Self-pay | Admitting: Internal Medicine

## 2021-01-10 DIAGNOSIS — R058 Other specified cough: Secondary | ICD-10-CM

## 2021-01-14 ENCOUNTER — Other Ambulatory Visit: Payer: Self-pay | Admitting: Endocrinology

## 2021-01-14 DIAGNOSIS — E118 Type 2 diabetes mellitus with unspecified complications: Secondary | ICD-10-CM

## 2021-01-24 ENCOUNTER — Ambulatory Visit: Payer: Medicare Other

## 2021-02-07 ENCOUNTER — Encounter: Payer: Self-pay | Admitting: Family

## 2021-02-07 ENCOUNTER — Ambulatory Visit (INDEPENDENT_AMBULATORY_CARE_PROVIDER_SITE_OTHER): Payer: Medicare Other | Admitting: Family

## 2021-02-07 ENCOUNTER — Other Ambulatory Visit: Payer: Medicare Other

## 2021-02-07 ENCOUNTER — Other Ambulatory Visit: Payer: Self-pay

## 2021-02-07 VITALS — BP 132/70 | HR 85 | Temp 98.4°F | Ht 64.0 in | Wt 160.4 lb

## 2021-02-07 DIAGNOSIS — E538 Deficiency of other specified B group vitamins: Secondary | ICD-10-CM | POA: Diagnosis not present

## 2021-02-07 DIAGNOSIS — R5383 Other fatigue: Secondary | ICD-10-CM

## 2021-02-07 DIAGNOSIS — D649 Anemia, unspecified: Secondary | ICD-10-CM

## 2021-02-07 LAB — CBC WITH DIFFERENTIAL/PLATELET
Basophils Absolute: 0 10*3/uL (ref 0.0–0.1)
Basophils Relative: 0.4 % (ref 0.0–3.0)
Eosinophils Absolute: 0.2 10*3/uL (ref 0.0–0.7)
Eosinophils Relative: 2.8 % (ref 0.0–5.0)
HCT: 34.5 % — ABNORMAL LOW (ref 36.0–46.0)
Hemoglobin: 11.1 g/dL — ABNORMAL LOW (ref 12.0–15.0)
Lymphocytes Relative: 35.2 % (ref 12.0–46.0)
Lymphs Abs: 2.3 10*3/uL (ref 0.7–4.0)
MCHC: 32.2 g/dL (ref 30.0–36.0)
MCV: 85.9 fl (ref 78.0–100.0)
Monocytes Absolute: 0.7 10*3/uL (ref 0.1–1.0)
Monocytes Relative: 10.2 % (ref 3.0–12.0)
Neutro Abs: 3.3 10*3/uL (ref 1.4–7.7)
Neutrophils Relative %: 51.4 % (ref 43.0–77.0)
Platelets: 194 10*3/uL (ref 150.0–400.0)
RBC: 4.02 Mil/uL (ref 3.87–5.11)
RDW: 14 % (ref 11.5–15.5)
WBC: 6.5 10*3/uL (ref 4.0–10.5)

## 2021-02-07 LAB — COMPREHENSIVE METABOLIC PANEL
ALT: 15 U/L (ref 0–35)
AST: 19 U/L (ref 0–37)
Albumin: 3.8 g/dL (ref 3.5–5.2)
Alkaline Phosphatase: 95 U/L (ref 39–117)
BUN: 13 mg/dL (ref 6–23)
CO2: 30 mEq/L (ref 19–32)
Calcium: 9.2 mg/dL (ref 8.4–10.5)
Chloride: 103 mEq/L (ref 96–112)
Creatinine, Ser: 0.93 mg/dL (ref 0.40–1.20)
GFR: 60.11 mL/min (ref >60.00)
Glucose, Bld: 121 mg/dL — ABNORMAL HIGH (ref 70–99)
Potassium: 3.3 mEq/L — ABNORMAL LOW (ref 3.5–5.1)
Sodium: 143 mEq/L (ref 135–145)
Total Bilirubin: 0.4 mg/dL (ref 0.2–1.2)
Total Protein: 7 g/dL (ref 6.0–8.3)

## 2021-02-07 LAB — VITAMIN B12: Vitamin B-12: 1030 pg/mL — ABNORMAL HIGH (ref 211–911)

## 2021-02-07 NOTE — Progress Notes (Signed)
Amy Escobar is a 75 y.o. 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   Claudication ([place]) 04/19/2019   Morbid obesity ([place]) 04/19/2019   Suspected COVID-19 virus infection 04/13/2019   Upper airway cough syndrome 08/22/2018   Greater trochanteric bursitis of both hips 06/15/2018   Trigger point of shoulder region, left 02/07/2018   Hypertension    Mild pulmonary hypertension (HCC)    NSVT (nonsustained ventricular tachycardia) (HCC)    PVC's (premature ventricular contractions)    URI (upper respiratory infection) 08/09/2016   Neck pain 02/10/2016   Urinary incontinence 02/10/2016   Degenerative arthritis of knee, bilateral 07/17/2015   Knee MCL sprain 06/07/2015   Discomfort in chest 02/15/2015   Chest pain 02/15/2015   DM type 2, controlled, with complication ([place])    Wellness examination 08/06/2014   Acute meniscal tear of knee 02/13/2014   Primary localized osteoarthrosis, lower leg 02/13/2014   Gastrocnemius tear 12/25/2013   UTI (urinary tract infection) 12/20/2013   Right knee pain 12/20/2013   Pulmonary hypertension ([place]) 10/06/2013   DOE (dyspnea on exertion) 08/04/2013   Dysphagia, unspecified(787.20) 08/10/2012   Hip pain, left 02/02/2012   Painful respiration 05/25/2011   Encounter for long-term (current) use of other medications 01/30/2011   PELVIC PAIN, LEFT 07/30/2010   TOBACCO USE, QUIT 07/07/2010   FATIGUE  12/20/2009   Headache 12/20/2009   Low back pain 12/26/2008   Disorder of liver 04/13/2008   FOOT PAIN, BILATERAL 04/13/2008   FIBROIDS, UTERUS 11/24/2007   THYROID NODULE, LEFT 11/24/2007   HYPERCHOLESTEROLEMIA 11/24/2007   CARPAL TUNNEL SYNDROME, BILATERAL 11/24/2007   ALKALINE PHOSPHATASE, ELEVATED 11/24/2007   Gout 08/10/2007   ANXIETY 08/10/2007   Essential hypertension 08/10/2007   Cough 08/01/2007   Perennial and seasonal allergic rhinitis 01/14/2007   GERD 01/14/2007   Osteoporosis 01/14/2007    Current Outpatient Medications  Medication Sig Dispense Refill   aspirin EC 81 MG tablet Take 81 mg by mouth daily. Swallow whole.     benzonatate (TESSALON) 100 MG capsule TAKE ONE CAPSULE BY MOUTH THREE TIMES A DAY AS NEEDED FOR COUGH 45 capsule 2   Blood Glucose Monitoring Suppl (ONETOUCH VERIO FLEX SYSTEM) w/Device KIT USE TO TEST BLOOD SUGAR DAILY AS DIRECTED 1 kit 0   calcium elemental as carbonate (ANTACID MAXIMUM) 400 MG chewable tablet Chew 1,000 mg by mouth as needed for heartburn.     carvedilol (COREG) 12.5 MG tablet Take 1 tablet (12.5 mg total) by mouth 2 (two) times daily with a meal. 180 tablet 3   Cyanocobalamin (B-12 PO) Take by mouth daily.     DULoxetine (CYMBALTA) 30 MG capsule Take 1 capsule (30 mg total) by mouth daily. 30 capsule 3   esomeprazole (NEXIUM) 40 MG capsule TAKE ONE CAPSULE BY MOUTH TWO TIMES A DAY. 30 TO 60 MINUTES BEFORE YOUR FIRST AND LAST MEALS OF THE DAY 180 capsule 0   famotidine (PEPCID) 20 MG tablet Take 20 mg by mouth daily.     fesoterodine (TOVIAZ) 4 MG  TB24 tablet Take 4 mg daily by mouth.     glucose blood (ONETOUCH VERIO) test strip USE TO CHECK BLOOD SUGAE TWO TIMES A DAY 100 strip 3   guaiFENesin (MUCINEX) 600 MG 12 hr tablet Take 600 mg by mouth 2 (two) times daily as needed.     montelukast (SINGULAIR) 10 MG tablet Take 1 tablet by mouth daily.     valsartan (DIOVAN) 160 MG tablet Take 1 tablet (160 mg total) by mouth daily. 90  tablet 2   Vitamin D, Ergocalciferol, (DRISDOL) 1.25 MG (50000 UNIT) CAPS capsule TAKE ONE CAPSULE BY MOUTH ONCE WEEKLY 12 capsule 0   metFORMIN (GLUCOPHAGE-XR) 500 MG 24 hr tablet TAKE TWO TABLETS BY MOUTH TWICE A DAY (Patient not taking: Reported on 02/07/2021) 360 tablet 0   Respiratory Therapy Supplies MISC Use flutter valve as needed to break the coughing cycle. (Patient not taking: Reported on 02/07/2021) 1 each 1   No current facility-administered medications for this visit.    Allergies: Olmesartan medoxomil  Past Medical History:  Diagnosis Date   Alkaline phosphatase deficiency    w/u Ne   Allergic rhinitis    Anxiety    Arthritis    DM2 (diabetes mellitus, type 2) (HCC)    GERD (gastroesophageal reflux disease)    Gout    Hemorrhoids    Hyperlipidemia    Hypertension    Mild pulmonary hypertension (HCC)    NSVT (nonsustained ventricular tachycardia) (HCC)    Osteoporosis    PVC's (premature ventricular contractions)    Thyroid nodule    small   Tubular adenoma of colon 2017   Urticaria    Uterine fibroid     Past Surgical History:  Procedure Laterality Date   CATARACT EXTRACTION Bilateral 2016   COLONOSCOPY  2007   echocardiogram (other)  01/16/2002   removed tumors from foot nerves  04/1999   stress cardiolite  02/12/2006   TOENAIL EXCISION     TUBAL LIGATION     TYMPANOSTOMY TUBE PLACEMENT      Family History  Problem Relation Age of Onset   Heart disease Mother    Allergic rhinitis Mother    Heart disease Father    Heart disease Maternal Grandfather    Rectal cancer Maternal Grandfather    Stomach cancer Maternal Grandmother    Colon cancer Neg Hx    Breast cancer Neg Hx     Social History   Tobacco Use   Smoking status: Former    Packs/day: 0.25    Years: 5.00    Pack years: 1.25    Types: Cigarettes    Quit date: 06/29/1968    Years since quitting: 52.6   Smokeless tobacco: Never  Substance Use Topics   Alcohol use: No    Alcohol/week: 0.0  standard drinks    Subjective:  Presents with concerns for chronic fatigue/ "feeling sleepy all the time." Worried that could have medication reaction as source; Had normal sleep study in 11/2018 when she had similar complaints; Was found to have borderline low B12 level in January 2022;    Objective:  Vitals:   02/07/21 1034  BP: 132/70  Pulse: 85  Temp: 98.4 F (36.9 C)  TempSrc: Oral  SpO2: 96%  Weight: 160 lb 6.4 oz (72.8 kg)  Height: '5\' 4"'  (1.626 m)    General: Well developed, well nourished, in no acute distress  Skin : Warm and dry.  Head: Normocephalic and atraumatic  Eyes: Sclera and conjunctiva clear; pupils  round and reactive to light; extraocular movements intact  Ears: External normal; canals clear; tympanic membranes normal  Oropharynx: Pink, supple. No suspicious lesions  Neck: Supple without thyromegaly, adenopathy  Lungs: Respirations unlabored; clear to auscultation bilaterally without wheeze, rales, rhonchi  CVS exam: normal rate and regular rhythm.  Neurologic: Alert and oriented; speech intact; face symmetrical; moves all extremities well; CNII-XII intact without focal deficit   Assessment:  1. Low vitamin B12 level   2. Other fatigue     Plan:  Update labs today; Patient will try tapering off Duloxetine- cut back to one 30 mg tablet per day and call back with response; could also consider Singulair as source of fatigue; sleep study was normal in 2020; stressed to make sure her cardiologist is aware of fatigue at upcoming appointment as well.   This visit occurred during the SARS-CoV-2 public health emergency.  Safety protocols were in place, including screening questions prior to the visit, additional usage of staff PPE, and extensive cleaning of exam room while observing appropriate contact time as indicated for disinfecting solutions.    No follow-ups on file.  Orders Placed This Encounter  Procedures   Comp Met (CMET)   CBC with  Differential/Platelet   Vitamin B12    Requested Prescriptions    No prescriptions requested or ordered in this encounter

## 2021-02-07 NOTE — Patient Instructions (Signed)
Please try cutting back on the Duloxetine ( Cymbalta) to once per day; let me know if that helps with the fatigue and we may need to stop completely;

## 2021-02-08 LAB — IRON,TIBC AND FERRITIN PANEL
%SAT: 22 % (calc) (ref 16–45)
Ferritin: 121 ng/mL (ref 16–288)
Iron: 65 ug/dL (ref 45–160)
TIBC: 297 mcg/dL (calc) (ref 250–450)

## 2021-02-11 ENCOUNTER — Other Ambulatory Visit: Payer: Self-pay | Admitting: Family

## 2021-02-11 ENCOUNTER — Ambulatory Visit: Payer: Medicare Other | Admitting: Cardiology

## 2021-02-11 MED ORDER — POTASSIUM CHLORIDE CRYS ER 10 MEQ PO TBCR: 10.0000 meq | EXTENDED_RELEASE_TABLET | Freq: Two times a day (BID) | ORAL | 0 refills | Status: DC

## 2021-02-17 ENCOUNTER — Other Ambulatory Visit: Payer: Self-pay | Admitting: Family Medicine

## 2021-02-26 ENCOUNTER — Ambulatory Visit (HOSPITAL_BASED_OUTPATIENT_CLINIC_OR_DEPARTMENT_OTHER): Payer: Medicare Other | Admitting: Family

## 2021-02-27 ENCOUNTER — Other Ambulatory Visit: Payer: Self-pay | Admitting: Family

## 2021-02-27 ENCOUNTER — Telehealth: Payer: Self-pay | Admitting: Family

## 2021-02-27 NOTE — Telephone Encounter (Signed)
At her last OV, we discussed tapering off the Cymbalta to only one per day. How is she feeling with this change? Does she want to taper off the medication completely or is the fatigue better?

## 2021-02-27 NOTE — Telephone Encounter (Signed)
It's better not to just stop this medication "cold [place]."  Let's have her taper off-  Go to every day x 3 days and then every 3 days x 3 days and then stop.  She needs to make sure she keeps upcoming appointment with cardiology and make sure they are aware of her fatigue issues. I am concerned this is related to her heart and not medication.

## 2021-02-27 NOTE — Telephone Encounter (Signed)
I have called pt back and asked the providers question.   Pt reports she is doing well, but she is still feeling fatigued. She will stop and sit when she gets tired. Pt stated that she wants to stop the rx for the month of Sept and she will start today since she did not take that medication yet.   She will give [place] a call if she feels like she needs to be back on it.

## 2021-02-27 NOTE — Telephone Encounter (Signed)
I have called pt back and relayed the providers message she stated understanding and will take the medication every other day for a week and then do every other day.   Pt stated that she will call us back after her cards appt next tue.

## 2021-03-03 NOTE — Progress Notes (Signed)
Office Visit    Patient Name: Amy Escobar Date of Encounter: 03/04/2021  PCP:  [name], [place]  Cardiologist:  Freada Bergeron, MD  Advanced Practice Provider:  No care team member to display Electrophysiologist:  None      Chief Complaint    Amy Escobar is a 75 y.o. female with a hx of hypertension, hyperlipidemia, insulin-dependent diabetes, moderate pulmonary hypertension, moderate nonobstructive coronary disease presents today for follow-up of CAD  Past Medical History    Past Medical History:  Diagnosis Date   Alkaline phosphatase deficiency    w/u Ne   Allergic rhinitis    Anxiety    Arthritis    DM2 (diabetes mellitus, type 2) ([place])    GERD (gastroesophageal reflux disease)    Gout    Hemorrhoids    Hyperlipidemia    Hypertension    Mild pulmonary hypertension ([place])    NSVT (nonsustained ventricular tachycardia) ([place])    Osteoporosis    PVC's (premature ventricular contractions)    Thyroid nodule    small   Tubular adenoma of colon 2017   Urticaria    Uterine fibroid    Past Surgical History:  Procedure Laterality Date   CATARACT EXTRACTION Bilateral 2016   COLONOSCOPY  2007   echocardiogram (other)  01/16/2002   removed tumors from foot nerves  04/1999   stress cardiolite  02/12/2006   TOENAIL EXCISION     TUBAL LIGATION     TYMPANOSTOMY TUBE PLACEMENT      Allergies  Allergies  Allergen Reactions   Olmesartan Medoxomil     REACTION: headache    History of Present Illness    Amy Escobar is a 75 y.o. female with a hx of hypertension, hyperlipidemia, insulin-dependent diabetes, moderate pulmonary hypertension, moderate nonobstructive coronary disease last seen 08/26/20.  Previous coronary CTA in 2018 with calcium score of 220 placing her in the 81st percentile for age and sex matched control.  There was moderate plaque in the ostial left main and mild plaque in the ostial RCA.   She was seen August 2020 by Dr. Meda Coffee noting dyspnea on exertion and chest pain.  2D echo 03/10/2019 normal LVEF 55 to 60%, borderline enlarged pulmonary artery, normal RVSP.  She underwent stress test which was normal.  She was last seen 08/26/2020 and was doing very well from a cardiac perspective.  Home blood pressure was 130s to 150s.  S/p carvedilol was increased to 12.5 mg twice daily and valsartan to 260 mg daily.  She presents today for follow-up. Tells me she is concerned about having blockages. She reports pains under her right chest or under her left breast. They occur at random and self resolve. No noted aggravating or relieving factors. Reports no shortness of breath nor dyspnea on exertion. No edema, orthopnea, PND. Reports no palpitations.  Enjoys walking for exercise in her apartment complex which is a converted school building.   EKGs/Labs/Other Studies Reviewed:   The following studies were reviewed today:  NST 03/10/2019 Nuclear stress EF: 59%. There was no ST segment deviation noted during stress. The study is normal. This is a low risk study. The left ventricular ejection fraction is normal (55-65%).   Normal stress nuclear study with no ischemia or infarction.  Gated ejection fraction 59% with normal wall motion.    Nuclear History and Indications     2D echo 03/10/2019 IMPRESSIONS     1. The left  ventricle has normal systolic function, with an ejection  fraction of 55-60%. The cavity size was normal. There is mild concentric  left ventricular hypertrophy. Left ventricular diastolic Doppler  parameters are indeterminate. No evidence of  left ventricular regional wall motion abnormalities.   2. Trivial pericardial effusion is present.   3. The aortic valve is tricuspid. No stenosis of the aortic valve.   4. The aorta is normal unless otherwise noted.   5. The aortic root, ascending aorta and aortic arch are normal in size  and structure.   6. Borderline enlarged  pulmonary artery.   FINDINGS   Left Ventricle: The left ventricle has normal systolic function, with an  ejection fraction of 55-60%. The cavity size was normal. There is mild  concentric left ventricular hypertrophy. Left ventricular diastolic  Doppler parameters are indeterminate. No  evidence of left ventricular regional wall motion abnormalities..   Right Ventricle: The right ventricle has low normal systolic function. The  cavity was normal. There is no increase in right ventricular wall  thickness. Right ventricular systolic pressure is normal.   Left Atrium: Left atrial size was normal in size.   Right Atrium: Right atrial size was normal in size.   Interatrial Septum: No atrial level shunt detected by color flow Doppler.   Pericardium: Trivial pericardial effusion is present. There is a  pericardial fat pad noted.   Mitral Valve: The mitral valve is normal in structure. Mitral valve  regurgitation is trivial by color flow Doppler.   Tricuspid Valve: The tricuspid valve is normal in structure. Tricuspid  valve regurgitation is mild by color flow Doppler.   Aortic Valve: The aortic valve is tricuspid Aortic valve regurgitation was  not visualized by color flow Doppler. There is No stenosis of the aortic  valve.   Pulmonic Valve: The pulmonic valve was grossly normal. Pulmonic valve  regurgitation is not visualized by color flow Doppler. No evidence of  pulmonic stenosis.   Aorta: The aortic root, ascending aorta and aortic arch are normal in size  and structure. The aorta is normal unless otherwise noted.   Pulmonary Artery: The pulmonary artery is borderline enlarged.   EKG:  EKG is ordered today.  The ekg ordered today demonstrates NSR 89 bpm with LVH repolarization. No acute ST/T wave changes.   Recent Labs: 04/05/2020: Pro B Natriuretic peptide (BNP) 88.0 07/26/2020: TSH 1.99 02/07/2021: ALT 15; BUN 13; Creatinine, Ser 0.93; Hemoglobin 11.1; Platelets 194.0;  Potassium 3.3; Sodium 143  Recent Lipid Panel    Component Value Date/Time   CHOL 162 08/26/2020 0846   TRIG 105 08/26/2020 0846   TRIG 48 05/03/2006 0733   HDL 44 08/26/2020 0846   CHOLHDL 3.7 08/26/2020 0846   CHOLHDL 3 07/04/2018 1554   VLDL 12.8 07/04/2018 1554   LDLCALC 99 08/26/2020 0846   LDLDIRECT 165.6 06/19/2009 0731    Home Medications   Current Meds  Medication Sig   rosuvastatin (CRESTOR) 10 MG tablet Take 1 tablet (10 mg total) by mouth daily.     Review of Systems      All other systems reviewed and are otherwise negative except as noted above.  Physical Exam    VS:  BP 130/80 (BP Location: Right Arm, Patient Position: Sitting)   Pulse 89   Ht 5' 1.5" (1.562 m)   Wt 157 lb (71.2 kg)   SpO2 96%   BMI 29.18 kg/m  , BMI Body mass index is 29.18 kg/m.  Wt Readings from Last  3 Encounters:  03/04/21 157 lb (71.2 kg)  02/07/21 160 lb 6.4 oz (72.8 kg)  12/26/20 157 lb (71.2 kg)     GEN: Well nourished, well developed, in no acute distress. HEENT: normal. Neck: Supple, no JVD, carotid bruits, or masses. Cardiac: RRR, no murmurs, rubs, or gallops. No clubbing, cyanosis, edema.  Radials/PT 2+ and equal bilaterally.  Respiratory:  Respirations regular and unlabored, clear to auscultation bilaterally. GI: Soft, nontender, nondistended. MS: No deformity or atrophy. Skin: Warm and dry, no rash. Neuro:  Strength and sensation are intact. Psych: Normal affect.  Assessment & Plan    Nonobstructive CAD - Stable with no anginal symptoms. No indication for ischemic evaluation.  We discussed that her infrequent pain under right arm is likely a musculoskeletal pain. GDMT includes Aspirin, Coreg, Rosuvastatin. Heart healthy diet and regular cardiovascular exercise encouraged.    Hypertension- BP mildly elevated in clinic thought improved from 162/88 to 136/80 without intervention. She will monitor at home and report BP consistently >130/80 after medications. Continue  Coreg 12.'5mg'$  BID, Vaslartan '160mg'$  daily. For persistently elevated BP could consider incresaed dose Valsartan.   Hypokalemia - CMP for monitoring today.   Pulmonary hypertension- Reports no dyspnea. Continue optimal BP control. No indication for Lasix at this time.   Hyperlipidemia, LDL goal less than 70 - 08/26/20 LDL 99. Increase rosuvastatin to '10mg'$  daily. Repeat FLP/CMP in 2 months.   Disposition: Follow up in 6 month(s) with Dr. Johney Frame or APP.  Signed, Loel Dubonnet, NP 03/04/2021, 9:07 AM [place]

## 2021-03-04 ENCOUNTER — Ambulatory Visit: Payer: Medicare Other | Admitting: Podiatry

## 2021-03-04 ENCOUNTER — Ambulatory Visit (INDEPENDENT_AMBULATORY_CARE_PROVIDER_SITE_OTHER): Payer: Medicare Other | Admitting: Family

## 2021-03-04 ENCOUNTER — Encounter (HOSPITAL_BASED_OUTPATIENT_CLINIC_OR_DEPARTMENT_OTHER): Payer: Self-pay | Admitting: Family

## 2021-03-04 ENCOUNTER — Other Ambulatory Visit: Payer: Self-pay

## 2021-03-04 VITALS — BP 136/80 | HR 89 | Ht 61.5 in | Wt 157.0 lb

## 2021-03-04 DIAGNOSIS — E785 Hyperlipidemia, unspecified: Secondary | ICD-10-CM

## 2021-03-04 DIAGNOSIS — I272 Pulmonary hypertension, unspecified: Secondary | ICD-10-CM

## 2021-03-04 DIAGNOSIS — I25118 Atherosclerotic heart disease of native coronary artery with other forms of angina pectoris: Secondary | ICD-10-CM

## 2021-03-04 DIAGNOSIS — I10 Essential (primary) hypertension: Secondary | ICD-10-CM | POA: Diagnosis not present

## 2021-03-04 MED ORDER — ROSUVASTATIN CALCIUM 10 MG PO TABS: 10.0000 mg | ORAL_TABLET | Freq: Every day | ORAL | 3 refills | Status: DC

## 2021-03-04 NOTE — Patient Instructions (Addendum)
Medication Instructions:  Your physician has recommended you make the following change in your medication:   CHANGE your Rosuvastatin to '10mg'$  daily   *If you need a refill on your cardiac medications before your next appointment, please call your pharmacy*   Lab Work: Your physician recommends that you return for lab work today for complete metabolic panel.  Your physician recommends that you return for lab work in 2 months for fasting lipid panel   If you have labs (blood work) drawn today and your tests are completely normal, you will receive your results only by: [place] (if you have MyChart) OR A paper copy in the mail If you have any lab test that is abnormal or we need to change your treatment, we will call you to review the results.  Testing/Procedures: Your EKG today showed normal sinus rhythm which is a great result  Follow-Up: At [facility], you and your health needs are our priority.  As part of our continuing mission to provide you with exceptional heart care, we have created designated Provider Care Teams.  These Care Teams include your primary Cardiologist (physician) and Advanced Practice Providers (APPs -  Physician Assistants and Nurse Practitioners) who all work together to provide you with the care you need, when you need it.  We recommend signing up for the patient portal called "MyChart".  Sign up information is provided on this After Visit Summary.  MyChart is used to connect with patients for Virtual Visits (Telemedicine).  Patients are able to view lab/test results, encounter notes, upcoming appointments, etc.  Non-urgent messages can be sent to your provider as well.   To learn more about what you can do with MyChart, go to [email or web address].    Your next appointment:   In 6 months with Dr. Johney Frame as scheduled   Other Instructions  Tips to Measure your Blood Pressure Correctly  Check your blood pressure at least 2 hours after your  medications. If your blood pressure is consistently more than 130/80, please call our office.   To determine whether you have hypertension, a medical professional will take a blood pressure reading. How you prepare for the test, the position of your arm, and other factors can change a blood pressure reading by 10% or more. That could be enough to hide high blood pressure, start you on a drug you don't really need, or lead your doctor to incorrectly adjust your medications.  National and international guidelines offer specific instructions for measuring blood pressure. If a doctor, nurse, or medical assistant isn't doing it right, don't hesitate to ask him or her to get with the guidelines.  Here's what you can do to ensure a correct reading:  Don't drink a caffeinated beverage or smoke during the 30 minutes before the test.  Sit quietly for five minutes before the test begins.  During the measurement, sit in a chair with your feet on the floor and your arm supported so your elbow is at about heart level.  The inflatable part of the cuff should completely cover at least 80% of your upper arm, and the cuff should be placed on bare skin, not over a shirt.  Don't talk during the measurement.  Have your blood pressure measured twice, with a brief break in between. If the readings are different by 5 points or more, have it done a third time.  In 2017, new guidelines from the [place], the [employer] of Cardiology, and nine other health organizations lowered  the diagnosis of high blood pressure to 130/80 mm Hg or higher for all adults. The guidelines also redefined the various blood pressure categories to now include normal, elevated, Stage 1 hypertension, Stage 2 hypertension, and hypertensive crisis (see "Blood pressure categories").   Blood Pressure Log   Date   Time  Blood Pressure  Position  Example: Nov 1 9 AM 124/78 sitting                                                       Heart Healthy Diet Recommendations: A low-salt diet is recommended. Meats should be grilled, baked, or boiled. Avoid fried foods. Focus on lean protein sources like fish or chicken with vegetables and fruits. The American Heart Association is a Microbiologist!    Exercise recommendations: The American Heart Association recommends 150 minutes of moderate intensity exercise weekly. Try 30 minutes of moderate intensity exercise 4-5 times per week. This could include walking, jogging, or swimming.   For coronary artery disease often called "heart disease" we aim for optimal guideline directed medical therapy. We use the "A, B, C"s to help keep [place] on track!  A = Aspirin '81mg'$  daily B = Beta blocker which helps to relax the heart. This is your Carvedilol. C = Cholesterol control. You take Rosuvastatin to help control your cholesterol.

## 2021-03-05 ENCOUNTER — Telehealth: Payer: Self-pay | Admitting: Cardiology

## 2021-03-05 ENCOUNTER — Encounter: Payer: Self-pay | Admitting: Podiatry

## 2021-03-05 ENCOUNTER — Telehealth: Payer: Self-pay | Admitting: Family

## 2021-03-05 ENCOUNTER — Ambulatory Visit (INDEPENDENT_AMBULATORY_CARE_PROVIDER_SITE_OTHER): Payer: Medicare Other | Admitting: Podiatry

## 2021-03-05 DIAGNOSIS — M2010 Hallux valgus (acquired), unspecified foot: Secondary | ICD-10-CM

## 2021-03-05 DIAGNOSIS — E1142 Type 2 diabetes mellitus with diabetic polyneuropathy: Secondary | ICD-10-CM

## 2021-03-05 LAB — COMPREHENSIVE METABOLIC PANEL
ALT: 11 IU/L (ref 0–32)
AST: 20 IU/L (ref 0–40)
Albumin/Globulin Ratio: 1.2 (ref 1.2–2.2)
Albumin: 4.1 g/dL (ref 3.7–4.7)
Alkaline Phosphatase: 119 IU/L (ref 44–121)
BUN/Creatinine Ratio: 10 — ABNORMAL LOW (ref 12–28)
BUN: 11 mg/dL (ref 8–27)
Bilirubin Total: 0.3 mg/dL (ref 0.0–1.2)
CO2: 26 mmol/L (ref 20–29)
Calcium: 9.6 mg/dL (ref 8.7–10.3)
Chloride: 102 mmol/L (ref 96–106)
Creatinine, Ser: 1.12 mg/dL — ABNORMAL HIGH (ref 0.57–1.00)
Globulin, Total: 3.4 g/dL (ref 1.5–4.5)
Glucose: 78 mg/dL (ref 65–99)
Potassium: 3.7 mmol/L (ref 3.5–5.2)
Sodium: 145 mmol/L — ABNORMAL HIGH (ref 134–144)
Total Protein: 7.5 g/dL (ref 6.0–8.5)
eGFR: 51 mL/min/{1.73_m2} — ABNORMAL LOW (ref >59)

## 2021-03-05 NOTE — Telephone Encounter (Signed)
The patient has been notified of the result and verbalized understanding.  All questions (if any) were answered. Nuala Alpha, LPN [id number] PM

## 2021-03-05 NOTE — Progress Notes (Signed)
This patient presents to the office to discuss her acquiring diabetic shoes.  She received a pair of diabetic shoes last year  which were approved by [employer].  She was diagnosed with diabetic neuropathy and treated with cymbalta.  She has attempted to acquire diabetic shoes but she was told her shoes would not be approved.  She presents to the office to discuss her options.  Vascular  Dorsalis pedis and posterior tibial pulses are palpable  B/L.  Capillary return  WNL.  Temperature gradient is  WNL.  Skin turgor  WNL  Sensorium  Senn Weinstein monofilament wire  WNL/diminished. Normal tactile sensation.  Nail Exam  Patient has normal nails with no evidence of bacterial or fungal infection.  Orthopedic  Exam  Muscle tone and muscle strength  WNL.  No limitations of motion feet  B/L.  No crepitus or joint effusion noted.  Foot type is unremarkable and digits show no abnormalities.HAV  B/L.  Skin  No open lesions.  Normal skin texture and turgor.   Diabetic neuropathy.  Discussed this problem with this patient.  To send the paperwork to [employer] for shoe approval.   She was given the same paperwork to hand deliver.  RTC  prn.   Gardiner Barefoot DPM

## 2021-03-05 NOTE — Telephone Encounter (Signed)
-----   Message from Loel Dubonnet, NP sent at 03/05/2021  7:35 AM EDT ----- Potassium is now normal. Normal liver function. Good result! Kidney function slightly decreased compared to previous, recommend increased fluid intake. Will recheck kidney function at labs in November as scheduled.

## 2021-03-05 NOTE — Telephone Encounter (Signed)
Patient returning call to discuss lab results. Please call back

## 2021-03-05 NOTE — Telephone Encounter (Signed)
Type of form received [employer] form for therapeutic shoes  Form faxed to [facility] at [phone]   Please contact the patient when the form is complete

## 2021-03-06 ENCOUNTER — Ambulatory Visit (INDEPENDENT_AMBULATORY_CARE_PROVIDER_SITE_OTHER): Payer: Medicare Other | Admitting: Sports Medicine

## 2021-03-06 ENCOUNTER — Ambulatory Visit (INDEPENDENT_AMBULATORY_CARE_PROVIDER_SITE_OTHER): Payer: Medicare Other

## 2021-03-06 ENCOUNTER — Other Ambulatory Visit: Payer: Self-pay

## 2021-03-06 ENCOUNTER — Encounter: Payer: Self-pay | Admitting: Sports Medicine

## 2021-03-06 ENCOUNTER — Ambulatory Visit: Payer: Self-pay

## 2021-03-06 VITALS — BP 126/78 | HR 87 | Ht 61.5 in | Wt 159.0 lb

## 2021-03-06 DIAGNOSIS — M25511 Pain in right shoulder: Secondary | ICD-10-CM

## 2021-03-06 DIAGNOSIS — M7541 Impingement syndrome of right shoulder: Secondary | ICD-10-CM

## 2021-03-06 DIAGNOSIS — M19011 Primary osteoarthritis, right shoulder: Secondary | ICD-10-CM | POA: Diagnosis not present

## 2021-03-06 NOTE — Patient Instructions (Addendum)
Good to see you  Injection given in Right shoulder today  Take tylenol when you get home Will feel sore later tonight medication will kick in about 2 days Shoulder exercises given try to do 3X a week  Activity as tolerated  See me again in when you need me

## 2021-03-06 NOTE — Progress Notes (Signed)
Amy Escobar D.[place] [address] [place] Phone: [phone]   Assessment and Plan:     1. Acute pain of right shoulder 2.  Impingement syndrome, right 3. Osteoarthritis of right glenohumeral joint 4. Osteoarthritis of right AC (acromioclavicular) joint -Acute, unchanged, initial sports medicine visit - Pain likely due to acute flare of impingement syndrome, osteoarthritis of right glenohumeral and right AC joints - X-ray obtained in clinic.  My interpretation: Cortical irregularities seen at inferior glenoid, AC joint and greater tuberosity.  No acute fracture.  Joint spaces maintained though mildly decreased. - Patient elected for subacromial CSI.  Tolerated well per procedure note below - Start HEP.  Handout provided - Continue Tylenol, Salonpas, Voltaren gel as needed for pain control - DG Shoulder Right; Future - [place] LIMITED JOINT SPACE STRUCTURES UP RIGHT(NO LINKED CHARGES); Future   Procedure: Subacromial Injection Side: Right  Risks explained and consent was given verbally. The site was cleaned with alcohol prep. A steroid injection was performed from posterior approach using 64m of 1% lidocaine without epinephrine and 139mof kenalog 4066ml. This was well tolerated and resulted in symptomatic relief.  Needle was removed, hemostasis achieved, and post injection instructions were explained.   Pt was advised to call or return to clinic if these symptoms worsen or fail to improve as anticipated.      All pertinent previous records reviewed prior to and during visit.    Follow Up: As needed if no improvement or worsening of symptoms   Subjective:   I, Amy Escobar serving as a scribe for Dr. BenGlennon Machief Complaint: right shoulder pain   HPI: 75 63ar old Female presenting with R shoulder pain    03/06/21 Patient states Right shoulder pain X3-4 days. No MOI. Locates pain to back of R shoulder and  radiates to the front of shoulder and down the R arm.   Radiates: yes down R arm  Mechanical symptoms: no Numbness/tingling: no but has neuropathy Weakness: no Neck pain: yes back of neck/has arthritis Aggravates: nothing that she knows of  Treatments tried: salonpas, tylenol,    Relevant Historical Information: History of neck pain and cervical OA, but states that this feels like a different pain.  Denies weakness in upper extremities.  Additional pertinent review of systems negative.   Current Outpatient Medications:    aspirin EC 81 MG tablet, Take 81 mg by mouth daily. Swallow whole., Disp: , Rfl:    Blood Glucose Monitoring Suppl (ONETOUCH VERIO FLEX SYSTEM) w/Device KIT, USE TO TEST BLOOD SUGAR DAILY AS DIRECTED, Disp: 1 kit, Rfl: 0   calcium elemental as carbonate (ANTACID MAXIMUM) 400 MG chewable tablet, Chew 1,000 mg by mouth as needed for heartburn., Disp: , Rfl:    carvedilol (COREG) 12.5 MG tablet, Take 1 tablet (12.5 mg total) by mouth 2 (two) times daily with a meal., Disp: 180 tablet, Rfl: 3   Cyanocobalamin (B-12 PO), Take by mouth daily., Disp: , Rfl:    DULoxetine (CYMBALTA) 30 MG capsule, Take 1 capsule (30 mg total) by mouth daily., Disp: 30 capsule, Rfl: 3   esomeprazole (NEXIUM) 40 MG capsule, TAKE ONE CAPSULE BY MOUTH TWO TIMES A DAY. 30 TO 60 MINUTES BEFORE YOUR FIRST AND LAST MEALS OF THE DAY, Disp: 180 capsule, Rfl: 0   famotidine (PEPCID) 20 MG tablet, Take 20 mg by mouth daily., Disp: , Rfl:    fesoterodine (TOVIAZ) 4 MG TB24 tablet, Take 4  mg daily by mouth., Disp: , Rfl:    glucose blood (ONETOUCH VERIO) test strip, USE TO CHECK BLOOD SUGAE TWO TIMES A DAY, Disp: 100 strip, Rfl: 3   montelukast (SINGULAIR) 10 MG tablet, Take 1 tablet by mouth daily., Disp: , Rfl:    potassium chloride (KLOR-CON) 10 MEQ tablet, Take 1 tablet (10 mEq total) by mouth 2 (two) times daily., Disp: 5 tablet, Rfl: 0   rosuvastatin (CRESTOR) 10 MG tablet, Take 1 tablet (10 mg total)  by mouth daily., Disp: 90 tablet, Rfl: 3   valsartan (DIOVAN) 160 MG tablet, Take 1 tablet (160 mg total) by mouth daily., Disp: 90 tablet, Rfl: 2   Vitamin D, Ergocalciferol, (DRISDOL) 1.25 MG (50000 UNIT) CAPS capsule, TAKE ONE CAPSULE BY MOUTH ONCE WEEKLY, Disp: 12 capsule, Rfl: 0   benzonatate (TESSALON) 100 MG capsule, TAKE ONE CAPSULE BY MOUTH THREE TIMES A DAY AS NEEDED FOR COUGH (Patient not taking: Reported on 03/06/2021), Disp: 45 capsule, Rfl: 2   guaiFENesin (MUCINEX) 600 MG 12 hr tablet, Take 600 mg by mouth 2 (two) times daily as needed. (Patient not taking: Reported on 03/06/2021), Disp: , Rfl:    metFORMIN (GLUCOPHAGE-XR) 500 MG 24 hr tablet, TAKE TWO TABLETS BY MOUTH TWICE A DAY (Patient not taking: No sig reported), Disp: 360 tablet, Rfl: 0   Respiratory Therapy Supplies MISC, Use flutter valve as needed to break the coughing cycle. (Patient not taking: No sig reported), Disp: 1 each, Rfl: 1   Objective:     Vitals:   03/06/21 1032  BP: 126/78  Pulse: 87  SpO2: 96%  Weight: 159 lb (72.1 kg)  Height: 5' 1.5" (1.562 m)      Body mass index is 29.56 kg/m.    Physical Exam:    Gen: Appears well, nad, nontoxic and pleasant Neuro:sensation intact, strength is 5/5 with df/pf/inv/ev, muscle tone wnl Skin: no suspicious lesion or defmority Psych: A&O, appropriate mood and affect  Right shoulder: no deformity, swelling or muscle wasting No scapular winging FF 90, abd 80, int 10, ext 60 TTP AC, biceps groove, anterior/lateral/posterior shoulder musculature NTTP over the [place], humerus, deltoid, subacromial space, scap spine, trap, cervical spine Positive neer, hawkings, empty can, subscap liftoff, speeds, obriens Neg ant drawer, sulcus sign Negative Spurling's test bilat FROM of neck    Electronically signed by:  Amy Escobar D.[facility] 11:10 AM 03/06/21

## 2021-03-06 NOTE — Telephone Encounter (Signed)
I have not seen any paperwork for her yet.

## 2021-03-06 NOTE — Telephone Encounter (Signed)
I have called the pt and informed her that Mickel Baas no longer works at [employer]. She is now at the [facility] office. Pt stated that she will run the forms to [place]. I stated understanding and confirmed that she was coming to the [facility] office. I will be waiting on the ppwk.

## 2021-03-07 ENCOUNTER — Telehealth: Payer: Self-pay | Admitting: Family

## 2021-03-07 NOTE — Telephone Encounter (Signed)
Pt has called back and I have relayed the information. I have informed her that we are unable to sign the ppwk since Dr. Quay Burow name is on it and we are unable to turn in any notes stating that we manage her DM since that is done by another provider. Pt stated she wanted to switch to another Endo provider. I have informed her she has to call his office and inform them that she is thinking about the switch.   I will hold onto the paperwork for now, pt is to call his ENDO office to see if they will be about to send notes and sign stating that they are managing pts DM.

## 2021-03-07 NOTE — Telephone Encounter (Signed)
I have received the paperwork; however, it was under Dr. Quay Burow name. I then called [employer] to let them know that the provider on the form needs to be changed to Laura's supervising physician. The rep from [employer] stated that office notes will have to be with the paperwork and I have inform him that pt sees an Endocrinologist for DM management. The [employer] rep stated that Podiatrist has to put in another order with the corrected providers name; Dr. Burnett Kanaris since that is who manages her DM.   I have attempted to call pt to relay this message to her and there is no answer so I left a message to call back.   I have also called her Podiatrist  ask him to place another order under the correct providers name. No answer so I left a message on the identified answering service.

## 2021-03-07 NOTE — Telephone Encounter (Signed)
Pt dropped off document to be filled out by provider (3 pages of [place] [employer] [place] paperwork) Pt stated when document ready to call her to pick up copy and also it can be fax it is needed to [phone]. Document put at front office tray under providers name.

## 2021-03-10 ENCOUNTER — Telehealth: Payer: Self-pay | Admitting: Podiatry

## 2021-03-10 NOTE — Progress Notes (Signed)
I have reviewed the resulted tests. No significant abnormalities were found. No need for further action at this time. Patient may contact our office if they have specific questions.

## 2021-03-10 NOTE — Telephone Encounter (Signed)
Received voicemail from [place] I believe the name was Amy Escobar, stating that the provider for the diabetic shoes needs to be changed.  I looked in [facility] and the note states pt is seen by Dr Loanne Drilling the endo and Dr Quay Burow will not sign the documents.  Upon reading the notes from pcp office pt is to be switching to a new Endo..  I left message for pt that I got the message from Dr Quay Burow that she is not going to sign the documents for the diabetic shoes but pt is going to switch endocrinologist and I asked her to let me know who she is switching to and I can get documents switched into that name and sent over for her appt.

## 2021-03-29 ENCOUNTER — Other Ambulatory Visit: Payer: Self-pay | Admitting: Endocrinology

## 2021-03-29 ENCOUNTER — Other Ambulatory Visit: Payer: Self-pay | Admitting: Internal Medicine

## 2021-03-29 DIAGNOSIS — R058 Other specified cough: Secondary | ICD-10-CM

## 2021-04-03 ENCOUNTER — Other Ambulatory Visit (INDEPENDENT_AMBULATORY_CARE_PROVIDER_SITE_OTHER): Payer: Self-pay | Admitting: Ophthalmology

## 2021-04-15 ENCOUNTER — Ambulatory Visit
Admission: EM | Admit: 2021-04-15 | Discharge: 2021-04-15 | Disposition: A | Discharge status: Home / Self Care | Payer: Medicare Other | Attending: Internal Medicine | Admitting: Internal Medicine

## 2021-04-15 ENCOUNTER — Other Ambulatory Visit: Payer: Self-pay

## 2021-04-15 DIAGNOSIS — N3001 Acute cystitis with hematuria: Secondary | ICD-10-CM | POA: Insufficient documentation

## 2021-04-15 DIAGNOSIS — R300 Dysuria: Secondary | ICD-10-CM | POA: Insufficient documentation

## 2021-04-15 DIAGNOSIS — R350 Frequency of micturition: Secondary | ICD-10-CM | POA: Diagnosis not present

## 2021-04-15 LAB — POCT URINALYSIS DIP (MANUAL ENTRY)
Bilirubin, UA: NEGATIVE
Glucose, UA: NEGATIVE mg/dL
Ketones, POC UA: NEGATIVE mg/dL
Nitrite, UA: NEGATIVE
Protein Ur, POC: >=300 mg/dL — AB
Spec Grav, UA: 1.025 (ref 1.010–1.025)
Urobilinogen, UA: 0.2 E.U./dL
pH, UA: 6 (ref 5.0–8.0)

## 2021-04-15 MED ORDER — CEPHALEXIN 500 MG PO CAPS: 500.0000 mg | ORAL_CAPSULE | Freq: Two times a day (BID) | ORAL | 0 refills | Status: AC

## 2021-04-15 NOTE — ED Triage Notes (Signed)
One week h/o dysuria. Denies vaginal discharge and hematuria. Pt reports a long h/o her "bladder being pushed against the kidneys".

## 2021-04-15 NOTE — Discharge Instructions (Addendum)
You are being treated for urinary tract infection with cephalexin antibiotic.  Urine culture is pending.  We will call if it is positive.

## 2021-04-15 NOTE — ED Provider Notes (Signed)
[facility]    CSN: [id number] Arrival date & time: 04/15/21  1414      History   Chief Complaint Chief Complaint  Patient presents with   Dysuria    HPI Amy Escobar is a 75 y.o. female.   Patient presents with 1 week history of urinary burning and urinary frequency.  Denies vaginal discharge, hematuria, pelvic pain, abdominal pain, fever, chills but does endorse some right-sided lower back pain.  Denies any known exposure to STD.   Dysuria  Past Medical History:  Diagnosis Date   Alkaline phosphatase deficiency    w/u Ne   Allergic rhinitis    Anxiety    Arthritis    DM2 (diabetes mellitus, type 2) (HCC)    GERD (gastroesophageal reflux disease)    Gout    Hemorrhoids    Hyperlipidemia    Hypertension    Mild pulmonary hypertension (HCC)    NSVT (nonsustained ventricular tachycardia)    Osteoporosis    PVC's (premature ventricular contractions)    Thyroid nodule    small   Tubular adenoma of colon 2017   Urticaria    Uterine fibroid     Patient Active Problem List   Diagnosis Date Noted   Osteoarthritis of right AC (acromioclavicular) joint 03/06/2021   Osteoarthritis of right glenohumeral joint 03/06/2021   Foot pain, bilateral 12/26/2020   Diabetic neuropathy ([place]) 01/10/2020   Hav (hallux abducto valgus), unspecified laterality 01/10/2020   Chronic arthropathy 01/10/2020   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 edema associated with type 2 diabetes mellitus ([place]) 12/26/2019   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   AC (acromioclavicular) arthritis 08/02/2019   Unspecified inflammatory spondylopathy, 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   Claudication ([place]) 04/19/2019   Morbid obesity ([place]) 04/19/2019   Suspected COVID-19 virus infection 04/13/2019   Upper airway cough syndrome 08/22/2018   Greater trochanteric bursitis of both hips 06/15/2018   Trigger point of shoulder region, left  02/07/2018   Hypertension    Mild pulmonary hypertension (HCC)    NSVT (nonsustained ventricular tachycardia)    PVC's (premature ventricular contractions)    URI (upper respiratory infection) 08/09/2016   Neck pain 02/10/2016   Urinary incontinence 02/10/2016   Degenerative arthritis of knee, bilateral 07/17/2015   Knee MCL sprain 06/07/2015   Discomfort in chest 02/15/2015   Chest pain 02/15/2015   DM type 2, controlled, with complication ([place])    Wellness examination 08/06/2014   Acute meniscal tear of knee 02/13/2014   Primary localized osteoarthrosis, lower leg 02/13/2014   Gastrocnemius tear 12/25/2013   UTI (urinary tract infection) 12/20/2013   Right knee pain 12/20/2013   Pulmonary hypertension ([place]) 10/06/2013   DOE (dyspnea on exertion) 08/04/2013   Dysphagia, unspecified(787.20) 08/10/2012   Hip pain, left 02/02/2012   Painful respiration 05/25/2011   Encounter for long-term (current) use of other medications 01/30/2011   PELVIC PAIN, LEFT 07/30/2010   TOBACCO USE, QUIT 07/07/2010   FATIGUE 12/20/2009   Headache 12/20/2009   Low back pain 12/26/2008   Disorder of liver 04/13/2008   FOOT PAIN, BILATERAL 04/13/2008   FIBROIDS, UTERUS 11/24/2007   THYROID NODULE, LEFT 11/24/2007   HYPERCHOLESTEROLEMIA 11/24/2007   CARPAL TUNNEL SYNDROME, BILATERAL 11/24/2007   ALKALINE PHOSPHATASE, ELEVATED 11/24/2007   Gout 08/10/2007   ANXIETY 08/10/2007   Essential hypertension 08/10/2007   Cough 08/01/2007   Perennial and seasonal allergic rhinitis 01/14/2007   GERD 01/14/2007   Osteoporosis 01/14/2007  Past Surgical History:  Procedure Laterality Date   CATARACT EXTRACTION Bilateral 2016   COLONOSCOPY  2007   echocardiogram (other)  01/16/2002   removed tumors from foot nerves  04/1999   stress cardiolite  02/12/2006   TOENAIL EXCISION     TUBAL LIGATION     TYMPANOSTOMY TUBE PLACEMENT      OB History   No obstetric history on file.      Home Medications     Prior to Admission medications   Medication Sig Start Date End Date Taking? Authorizing Provider  cephALEXin (KEFLEX) 500 MG capsule Take 1 capsule (500 mg total) by mouth 2 (two) times daily for 5 days. 04/15/21 04/20/21 Yes Odis Luster, FNP  aspirin EC 81 MG tablet Take 81 mg by mouth daily. Swallow whole.    [provider]  benzonatate (TESSALON) 100 MG capsule TAKE ONE CAPSULE BY MOUTH THREE TIMES A DAY AS NEEDED FOR COUGH Patient not taking: Reported on 03/06/2021 12/13/20   Tanda Rockers, MD  Blood Glucose Monitoring Suppl (ONETOUCH VERIO FLEX SYSTEM) w/Device KIT USE TO TEST BLOOD SUGAR DAILY AS DIRECTED 11/11/20   Renato Shin, MD  calcium elemental as carbonate (ANTACID MAXIMUM) 400 MG chewable tablet Chew 1,000 mg by mouth as needed for heartburn.    [provider]  carvedilol (COREG) 12.5 MG tablet Take 1 tablet (12.5 mg total) by mouth 2 (two) times daily with a meal. 11/18/20   Bhagat, Bhavinkumar, PA  Cyanocobalamin (B-12 PO) Take by mouth daily.    [provider]  DULoxetine (CYMBALTA) 30 MG capsule Take 1 capsule (30 mg total) by mouth daily. 10/24/20   [name], FNP  esomeprazole ([place]) 40 MG capsule TAKE ONE CAPSULE BY MOUTH TWO TIMES A DAY. 30 TO 60 MINUTES BEFORE YOUR FIRST AND LAST MEALS OF THE DAY 01/10/21   Tanda Rockers, MD  famotidine (PEPCID) 20 MG tablet Take 20 mg by mouth daily. 10/05/20   [provider]  fesoterodine (TOVIAZ) 4 MG TB24 tablet Take 4 mg daily by mouth.    [provider]  glucose blood (ONETOUCH VERIO) test strip USE TO CHECK BLOOD SUGAE TWO TIMES A DAY 01/15/21   Renato Shin, MD  guaiFENesin (MUCINEX) 600 MG 12 hr tablet Take 600 mg by mouth 2 (two) times daily as needed. Patient not taking: Reported on 03/06/2021    [provider]  metFORMIN (GLUCOPHAGE-XR) 500 MG 24 hr tablet TAKE TWO TABLETS BY MOUTH TWICE A DAY 03/30/21   Renato Shin, MD  montelukast (SINGULAIR) 10 MG  tablet Take 1 tablet by mouth daily. 08/31/20   [provider]  Olopatadine HCl 0.2 % SOLN Place 1 drop into both eyes daily. 04/07/21 04/07/22  Rankin, Clent Demark, MD  potassium chloride (KLOR-CON) 10 MEQ tablet Take 1 tablet (10 mEq total) by mouth 2 (two) times daily. 02/11/21   [name], FNP  Respiratory Therapy Supplies MISC Use flutter valve as needed to break the coughing cycle. Patient not taking: No sig reported 06/26/19   Bobbitt, [place] Muta, MD  rosuvastatin (CRESTOR) 10 MG tablet Take 1 tablet (10 mg total) by mouth daily. 03/04/21 06/02/21  Loel Dubonnet, NP  valsartan (DIOVAN) 160 MG tablet Take 1 tablet (160 mg total) by mouth daily. 08/26/20   Dorothy Spark, MD  Vitamin D, Ergocalciferol, (DRISDOL) 1.25 MG (50000 UNIT) CAPS capsule TAKE ONE CAPSULE BY MOUTH ONCE WEEKLY 02/17/21   Lyndal Pulley, DO  Family History Family History  Problem Relation Age of Onset   Heart disease Mother    Allergic rhinitis Mother    Heart disease Father    Heart disease Maternal Grandfather    Rectal cancer Maternal Grandfather    Stomach cancer Maternal Grandmother    Colon cancer Neg Hx    Breast cancer Neg Hx     Social History Social History   Tobacco Use   Smoking status: Former    Packs/day: 0.25    Years: 5.00    Pack years: 1.25    Types: Cigarettes    Quit date: 06/29/1968    Years since quitting: 52.8   Smokeless tobacco: Never  Vaping Use   Vaping Use: Never used  Substance Use Topics   Alcohol use: No    Alcohol/week: 0.0 standard drinks   Drug use: No     Allergies   Olmesartan medoxomil   Review of Systems Review of Systems Per HPI  Physical Exam Triage Vital Signs ED Triage Vitals  Enc Vitals Group     BP 04/15/21 1424 138/82     Pulse Rate 04/15/21 1424 84     Resp 04/15/21 1424 18     Temp 04/15/21 1424 97.6 F (36.4 C)     Temp Source 04/15/21 1424 Oral     SpO2 04/15/21 1424 95 %     Weight --      Height --       Head Circumference --      Peak Flow --      Pain Score 04/15/21 1431 8     Pain Loc --      Pain Edu? --      Excl. in [place]? --    No data found.  Updated Vital Signs BP 138/82 (BP Location: Left Arm)   Pulse 84   Temp 97.6 F (36.4 C) (Oral)   Resp 18   SpO2 95%   Visual Acuity Right Eye Distance:   Left Eye Distance:   Bilateral Distance:    Right Eye Near:   Left Eye Near:    Bilateral Near:     Physical Exam Constitutional:      General: She is not in acute distress.    Appearance: Normal appearance. She is not toxic-appearing or diaphoretic.  HENT:     Head: Normocephalic and atraumatic.  Eyes:     Extraocular Movements: Extraocular movements intact.     Conjunctiva/sclera: Conjunctivae normal.  Cardiovascular:     Rate and Rhythm: Normal rate and regular rhythm.     Pulses: Normal pulses.     Heart sounds: Normal heart sounds.  Pulmonary:     Effort: Pulmonary effort is normal.  Abdominal:     General: Bowel sounds are normal. There is no distension.     Palpations: Abdomen is soft.     Tenderness: There is no abdominal tenderness.  Skin:    General: Skin is warm and dry.  Neurological:     General: No focal deficit present.     Mental Status: She is alert and oriented to person, place, and time. Mental status is at baseline.  Psychiatric:        Mood and Affect: Mood normal.        Behavior: Behavior normal.        Thought Content: Thought content normal.        Judgment: Judgment normal.     UC Treatments / Results  Labs (all labs  ordered are listed, but only abnormal results are displayed) Labs Reviewed  POCT URINALYSIS DIP (MANUAL ENTRY) - Abnormal; Notable for the following components:      Result Value   Clarity, UA cloudy (*)    Blood, UA moderate (*)    Protein Ur, POC >=300 (*)    Leukocytes, UA Large (3+) (*)    All other components within normal limits  URINE CULTURE    EKG   Radiology No results  found.  Procedures Procedures (including critical care time)  Medications Ordered in UC Medications - No data to display  Initial Impression / Assessment and Plan / UC Course  I have reviewed the triage vital signs and the nursing notes.  Pertinent labs & imaging results that were available during my care of the patient were reviewed by me and considered in my medical decision making (see chart for details).     Urinalysis indicating urinary tract infection.  Will treat cephalexin antibiotic x5 days given patient's age.  Urine culture is pending.  No red flags on exam.Discussed strict return precautions. Patient verbalized understanding and is agreeable with plan.  Final Clinical Impressions(s) / UC Diagnoses   Final diagnoses:  Acute cystitis with hematuria  Dysuria  Urinary frequency     Discharge Instructions      You are being treated for urinary tract infection with cephalexin antibiotic.  Urine culture is pending.  We will call if it is positive.     ED Prescriptions     Medication Sig Dispense Auth. Provider   cephALEXin (KEFLEX) 500 MG capsule Take 1 capsule (500 mg total) by mouth 2 (two) times daily for 5 days. 10 capsule Odis Luster, FNP      PDMP not reviewed this encounter.   Odis Luster, [place] 04/15/21 1451

## 2021-04-16 LAB — URINE CULTURE: Culture: 80000 — AB

## 2021-04-29 ENCOUNTER — Ambulatory Visit (INDEPENDENT_AMBULATORY_CARE_PROVIDER_SITE_OTHER): Payer: Medicare Other | Admitting: Family

## 2021-04-29 ENCOUNTER — Other Ambulatory Visit: Payer: Self-pay

## 2021-04-29 ENCOUNTER — Ambulatory Visit: Payer: Medicare Other | Attending: Internal Medicine

## 2021-04-29 ENCOUNTER — Encounter: Payer: Self-pay | Admitting: Family

## 2021-04-29 VITALS — BP 122/70 | HR 80 | Temp 98.1°F | Ht 64.0 in | Wt 152.2 lb

## 2021-04-29 DIAGNOSIS — E1142 Type 2 diabetes mellitus with diabetic polyneuropathy: Secondary | ICD-10-CM | POA: Diagnosis not present

## 2021-04-29 DIAGNOSIS — Z1211 Encounter for screening for malignant neoplasm of colon: Secondary | ICD-10-CM

## 2021-04-29 DIAGNOSIS — Z23 Encounter for immunization: Secondary | ICD-10-CM

## 2021-04-29 DIAGNOSIS — Z86010 Personal history of colonic polyps: Secondary | ICD-10-CM | POA: Diagnosis not present

## 2021-04-29 DIAGNOSIS — I10 Essential (primary) hypertension: Secondary | ICD-10-CM

## 2021-04-29 NOTE — Patient Instructions (Signed)
Please call [place] [facility] to ask about changing from Dr. Loanne Drilling to Dr. Kelton Pillar at the [facility] location;

## 2021-04-29 NOTE — Progress Notes (Signed)
Amy Escobar is a 75 y.o. 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 type 2 diabetes mellitus ([place]) 12/26/2019   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   AC (acromioclavicular) arthritis 08/02/2019   Unspecified inflammatory spondylopathy, 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[place]) 04/19/2019   Claudication ([place]) 04/19/2019   Morbid obesity ([place]) 04/19/2019   Suspected COVID-19 virus infection 04/13/2019   Upper airway cough syndrome 08/22/2018   Greater trochanteric bursitis of both hips 06/15/2018   Trigger point of shoulder region, left 02/07/2018   Hypertension    Mild pulmonary hypertension (HCC)    NSVT (nonsustained ventricular tachycardia)    PVC's (premature ventricular contractions)    URI (upper respiratory infection) 08/09/2016   Neck pain 02/10/2016   Urinary incontinence 02/10/2016   Degenerative arthritis of knee, bilateral 07/17/2015   Knee MCL sprain 06/07/2015   Discomfort in chest 02/15/2015   Chest pain 02/15/2015   DM type 2, controlled, with complication ([place])    Wellness examination 08/06/2014   Acute meniscal tear of knee 02/13/2014   Primary localized osteoarthrosis, lower leg 02/13/2014   Gastrocnemius tear 12/25/2013   UTI (urinary tract infection) 12/20/2013   Right knee pain 12/20/2013   Pulmonary hypertension ([place]) 10/06/2013   DOE (dyspnea on exertion) 08/04/2013   Dysphagia, unspecified(787.20) 08/10/2012   Hip pain, left 02/02/2012   Painful respiration 05/25/2011   Encounter for long-term  (current) use of other medications 01/30/2011   PELVIC PAIN, LEFT 07/30/2010   TOBACCO USE, QUIT 07/07/2010   FATIGUE 12/20/2009   Headache 12/20/2009   Low back pain 12/26/2008   Disorder of liver 04/13/2008   FOOT PAIN, BILATERAL 04/13/2008   FIBROIDS, UTERUS 11/24/2007   THYROID NODULE, LEFT 11/24/2007   HYPERCHOLESTEROLEMIA 11/24/2007   CARPAL TUNNEL SYNDROME, BILATERAL 11/24/2007   ALKALINE PHOSPHATASE, ELEVATED 11/24/2007   Gout 08/10/2007   ANXIETY 08/10/2007   Essential hypertension 08/10/2007   Cough 08/01/2007   Perennial and seasonal allergic rhinitis 01/14/2007   GERD 01/14/2007   Osteoporosis 01/14/2007    Current Outpatient Medications  Medication Sig Dispense Refill   aspirin EC 81 MG tablet Take 81 mg by mouth daily. Swallow whole.     Blood Glucose Monitoring Suppl (ONETOUCH VERIO FLEX SYSTEM) w/Device KIT USE TO TEST BLOOD SUGAR DAILY AS DIRECTED 1 kit 0   calcium elemental as carbonate (ANTACID MAXIMUM) 400 MG chewable tablet Chew 1,000 mg by mouth as needed for heartburn.     carvedilol (COREG) 12.5 MG tablet Take 1 tablet (12.5 mg total) by mouth 2 (two) times daily with a meal. 180 tablet 3   Cyanocobalamin (B-12 PO) Take by mouth daily.     DULoxetine (CYMBALTA) 30 MG capsule Take 1 capsule (30 mg total) by mouth daily. 30 capsule 3   esomeprazole (NEXIUM) 40 MG capsule TAKE ONE CAPSULE BY MOUTH TWO TIMES A DAY. 30 TO 60 MINUTES BEFORE YOUR FIRST AND LAST MEALS OF THE DAY 180 capsule 0   famotidine (PEPCID) 20 MG tablet Take 20 mg by mouth daily.     fesoterodine (TOVIAZ) 4 MG TB24 tablet Take 4 mg daily by  mouth.     glucose blood (ONETOUCH VERIO) test strip USE TO CHECK BLOOD SUGAE TWO TIMES A DAY 100 strip 3   guaiFENesin (MUCINEX) 600 MG 12 hr tablet Take 600 mg by mouth 2 (two) times daily as needed.     metFORMIN (GLUCOPHAGE-XR) 500 MG 24 hr tablet TAKE TWO TABLETS BY MOUTH TWICE A DAY 360 tablet 0   montelukast (SINGULAIR) 10 MG tablet Take 1 tablet by  mouth daily.     Olopatadine HCl 0.2 % SOLN Place 1 drop into both eyes daily. 1.5 mL 11   rosuvastatin (CRESTOR) 10 MG tablet Take 1 tablet (10 mg total) by mouth daily. 90 tablet 3   valsartan (DIOVAN) 160 MG tablet Take 1 tablet (160 mg total) by mouth daily. 90 tablet 2   Vitamin D, Ergocalciferol, (DRISDOL) 1.25 MG (50000 UNIT) CAPS capsule TAKE ONE CAPSULE BY MOUTH ONCE WEEKLY 12 capsule 0   potassium chloride (KLOR-CON) 10 MEQ tablet Take 1 tablet (10 mEq total) by mouth 2 (two) times daily. (Patient not taking: Reported on 04/29/2021) 5 tablet 0   Respiratory Therapy Supplies MISC Use flutter valve as needed to break the coughing cycle. (Patient not taking: No sig reported) 1 each 1   No current facility-administered medications for this visit.    Allergies: Olmesartan medoxomil  Past Medical History:  Diagnosis Date   Alkaline phosphatase deficiency    w/u Ne   Allergic rhinitis    Anxiety    Arthritis    DM2 (diabetes mellitus, type 2) (HCC)    GERD (gastroesophageal reflux disease)    Gout    Hemorrhoids    Hyperlipidemia    Hypertension    Mild pulmonary hypertension (HCC)    NSVT (nonsustained ventricular tachycardia)    Osteoporosis    PVC's (premature ventricular contractions)    Thyroid nodule    small   Tubular adenoma of colon 2017   Urticaria    Uterine fibroid     Past Surgical History:  Procedure Laterality Date   CATARACT EXTRACTION Bilateral 2016   COLONOSCOPY  2007   echocardiogram (other)  01/16/2002   removed tumors from foot nerves  04/1999   stress cardiolite  02/12/2006   TOENAIL EXCISION     TUBAL LIGATION     TYMPANOSTOMY TUBE PLACEMENT      Family History  Problem Relation Age of Onset   Heart disease Mother    Allergic rhinitis Mother    Heart disease Father    Heart disease Maternal Grandfather    Rectal cancer Maternal Grandfather    Stomach cancer Maternal Grandmother    Colon cancer Neg Hx    Breast cancer Neg Hx     Social  History   Tobacco Use   Smoking status: Former    Packs/day: 0.25    Years: 5.00    Pack years: 1.25    Types: Cigarettes    Quit date: 06/29/1968    Years since quitting: 52.8   Smokeless tobacco: Never  Substance Use Topics   Alcohol use: No    Alcohol/week: 0.0 standard drinks    Subjective:   6 month follow up on chronic care needs; Wants to establish with new endocrinologist- frustrated that current endocrinologist won't refill her diabetic shoes;  Has been feeling better with lowering dosage of Cymbalta to 30 mg daily; also feeling better with increased dosage of blood pressure medication;  No acute concerns today; overdue for 5 year follow up on colonoscopy; planning  to get COVID booster; has already had flu shot;    Objective:  Vitals:   04/29/21 0825  BP: 122/70  Pulse: 80  Temp: 98.1 F (36.7 C)  TempSrc: Oral  SpO2: 98%  Weight: 152 lb 3.2 oz (69 kg)  Height: '5\' 4"'  (1.626 m)    General: Well developed, well nourished, in no acute distress  Skin : Warm and dry.  Head: Normocephalic and atraumatic  Lungs: Respirations unlabored; clear to auscultation bilaterally without wheeze, rales, rhonchi  CVS exam: normal rate and regular rhythm.  Neurologic: Alert and oriented; speech intact; face symmetrical; moves all extremities well; CNII-XII intact without focal deficit   Assessment:  1. Encounter for colonoscopy due to history of adenomatous colonic polyps   2. Diabetic polyneuropathy associated with type 2 diabetes mellitus ([place])   3. Primary hypertension     Plan:  Referral updated; Good response to Duloxetine 30 mg daily; she understands that legally I cannot sign for her diabetic shoes- must be an MD; she will establish with new endocrinologist; Stable; continue with cardiology as scheduled;  This visit occurred during the SARS-CoV-2 public health emergency.  Safety protocols were in place, including screening questions prior to the visit, additional usage  of staff PPE, and extensive cleaning of exam room while observing appropriate contact time as indicated for disinfecting solutions.    Return in about 6 months (around 10/27/2021).  Orders Placed This Encounter  Procedures   Ambulatory referral to Gastroenterology    Referral Priority:   Routine    Referral Type:   Consultation    Referral Reason:   Specialty Services Required    Referred to Provider:   Ladene Artist, MD    Number of Visits Requested:   1    Requested Prescriptions    No prescriptions requested or ordered in this encounter

## 2021-04-29 NOTE — Progress Notes (Signed)
   Covid-19 Vaccination Clinic  Name:  Amy Escobar    MRN: [id number] DOB: May 09, 1946  04/29/2021  Ms. [name] was observed post Covid-19 immunization for 15 minutes without incident. She was provided with Vaccine Information Sheet and instruction to access the V-Safe system.   Ms. [name] was instructed to call 911 with any severe reactions post vaccine: Difficulty breathing  Swelling of face and throat  A fast heartbeat  A bad rash all over body  Dizziness and weakness   Immunizations Administered     Name Date Dose VIS Date Route   Pfizer Covid-19 Vaccine Bivalent Booster 04/29/2021  9:23 AM 0.3 mL 02/26/2021 Intramuscular   Manufacturer: [place]   Lot: [id number]   [place]: [phone]

## 2021-04-30 ENCOUNTER — Encounter: Payer: Self-pay | Admitting: Gastroenterology

## 2021-05-05 ENCOUNTER — Other Ambulatory Visit: Payer: Self-pay | Admitting: Internal Medicine

## 2021-05-07 ENCOUNTER — Other Ambulatory Visit: Payer: Self-pay | Admitting: Internal Medicine

## 2021-05-07 ENCOUNTER — Telehealth: Payer: Self-pay | Admitting: Internal Medicine

## 2021-05-07 DIAGNOSIS — N130 Hydronephrosis with ureteropelvic junction obstruction: Secondary | ICD-10-CM | POA: Diagnosis not present

## 2021-05-07 NOTE — Telephone Encounter (Signed)
Ok to try x one refill and schedule ov before this runs out or give enough to last until the ov at tid prn (max 3/day )

## 2021-05-07 NOTE — Telephone Encounter (Signed)
Hello Dr Melvyn Novas would you like to refill her Tessalon>

## 2021-05-07 NOTE — Telephone Encounter (Signed)
MW please advise on the refill of the tessalon perles.  Pt was last seen 06/2020.  No appts scheduled at this time.

## 2021-05-08 ENCOUNTER — Other Ambulatory Visit: Payer: Medicare Other

## 2021-05-08 MED ORDER — BENZONATATE 100 MG PO CAPS: ORAL_CAPSULE | ORAL | 0 refills | Status: DC

## 2021-05-08 NOTE — Telephone Encounter (Signed)
Refill has been sent to the pharmacy.  

## 2021-05-13 ENCOUNTER — Other Ambulatory Visit: Payer: Medicare Other | Admitting: *Deleted

## 2021-05-13 ENCOUNTER — Other Ambulatory Visit: Payer: Self-pay

## 2021-05-13 ENCOUNTER — Encounter (INDEPENDENT_AMBULATORY_CARE_PROVIDER_SITE_OTHER): Payer: Self-pay

## 2021-05-13 ENCOUNTER — Telehealth: Payer: Self-pay | Admitting: Cardiology

## 2021-05-13 ENCOUNTER — Other Ambulatory Visit: Payer: Self-pay | Admitting: Family

## 2021-05-13 DIAGNOSIS — I10 Essential (primary) hypertension: Secondary | ICD-10-CM

## 2021-05-13 DIAGNOSIS — E785 Hyperlipidemia, unspecified: Secondary | ICD-10-CM

## 2021-05-13 DIAGNOSIS — I2510 Atherosclerotic heart disease of native coronary artery without angina pectoris: Secondary | ICD-10-CM

## 2021-05-13 DIAGNOSIS — I25118 Atherosclerotic heart disease of native coronary artery with other forms of angina pectoris: Secondary | ICD-10-CM | POA: Diagnosis not present

## 2021-05-13 DIAGNOSIS — Z79899 Other long term (current) drug therapy: Secondary | ICD-10-CM

## 2021-05-13 NOTE — Telephone Encounter (Signed)
Will route this message to our PharmD team, for further assistance and advisement on this matter.  Will follow-up with the pt accordingly thereafter.

## 2021-05-13 NOTE — Telephone Encounter (Signed)
Pt aware that per [facility], this med is ok to take and there is only a small risk that it could lower her BP, but typically not an issue. Advised the pt to make sure she is staying plenty hydrated with water. Pt verbalized understanding and agrees with this plan. Pt was more than gracious for all the assistance provided.

## 2021-05-13 NOTE — Telephone Encounter (Signed)
New message   Pt c/o medication issue:  1. Name of Medication: nitrofurantoin mono-mcr  2. How are you currently taking this medication (dosage and times per day)?  100mg  daily for bladder problems  3. Are you having a reaction (difficulty breathing--STAT)? no  4. What is your medication issue? Patient want to know if she can take this medication along with her heart medication?  She was here for lab work and wanted to ask someone this question.  Please call

## 2021-05-13 NOTE — Progress Notes (Signed)
Amy Escobar with lab requesting orders placed as pt is here for lab draw.  Per C. Gilford Rile, NP note 02/2021 pt will need CMET and FLP.  Order placed.

## 2021-05-13 NOTE — Telephone Encounter (Signed)
Ok to take. There is a small risk that it could lower her blood pressure, but typically not an issue.

## 2021-05-15 LAB — COMPREHENSIVE METABOLIC PANEL
ALT: 19 IU/L (ref 0–32)
AST: 22 IU/L (ref 0–40)
Albumin/Globulin Ratio: 1.1 — ABNORMAL LOW (ref 1.2–2.2)
Albumin: 3.9 g/dL (ref 3.7–4.7)
Alkaline Phosphatase: 100 IU/L (ref 44–121)
BUN/Creatinine Ratio: 16 (ref 12–28)
BUN: 12 mg/dL (ref 8–27)
Bilirubin Total: 0.3 mg/dL (ref 0.0–1.2)
CO2: 27 mmol/L (ref 20–29)
Calcium: 8.9 mg/dL (ref 8.7–10.3)
Chloride: 100 mmol/L (ref 96–106)
Creatinine, Ser: 0.75 mg/dL (ref 0.57–1.00)
Globulin, Total: 3.4 g/dL (ref 1.5–4.5)
Glucose: 142 mg/dL — ABNORMAL HIGH (ref 70–99)
Potassium: 3.9 mmol/L (ref 3.5–5.2)
Sodium: 140 mmol/L (ref 134–144)
Total Protein: 7.3 g/dL (ref 6.0–8.5)
eGFR: 83 mL/min/{1.73_m2} (ref >59)

## 2021-05-15 LAB — LIPID PANEL
Chol/HDL Ratio: 3.2 ratio (ref 0.0–4.4)
Cholesterol, Total: 152 mg/dL (ref 100–199)
HDL: 48 mg/dL (ref >39)
LDL Chol Calc (NIH): 88 mg/dL (ref 0–99)
Triglycerides: 83 mg/dL (ref 0–149)
VLDL Cholesterol Cal: 16 mg/dL (ref 5–40)

## 2021-05-15 MED ORDER — ROSUVASTATIN CALCIUM 20 MG PO TABS: 20.0000 mg | ORAL_TABLET | Freq: Every day | ORAL | 3 refills | Status: DC

## 2021-05-15 NOTE — Addendum Note (Signed)
Addended by: Gerald Stabs on: 05/15/2021 02:42 PM   Modules accepted: Orders

## 2021-05-15 NOTE — Progress Notes (Signed)
Called pt. To review lab work, medication change, and follow up lab work  "Normal kidney and liver function, electrolytes. Cholesterol panel shows LDL improved from 99 to 88 but not at goal of <70. Recommend increasing Crestor to 20mg  daily with repeat LFT/FLP in 8 weeks. "  Pt. Endorses understanding and requested a new prescription. Will fill and mail labs to pt.

## 2021-05-19 ENCOUNTER — Other Ambulatory Visit (HOSPITAL_BASED_OUTPATIENT_CLINIC_OR_DEPARTMENT_OTHER): Payer: Self-pay

## 2021-05-19 ENCOUNTER — Other Ambulatory Visit: Payer: Self-pay | Admitting: Family Medicine

## 2021-05-19 MED ORDER — PFIZER COVID-19 VAC BIVALENT 30 MCG/0.3ML IM SUSP
INTRAMUSCULAR | 0 refills | Status: DC
  Filled 2021-05-19: qty 0.3, 1d supply, fill #0

## 2021-06-05 ENCOUNTER — Other Ambulatory Visit (HOSPITAL_BASED_OUTPATIENT_CLINIC_OR_DEPARTMENT_OTHER): Payer: Self-pay | Admitting: *Deleted

## 2021-06-05 DIAGNOSIS — E782 Mixed hyperlipidemia: Secondary | ICD-10-CM

## 2021-06-05 DIAGNOSIS — I10 Essential (primary) hypertension: Secondary | ICD-10-CM

## 2021-06-05 DIAGNOSIS — I2510 Atherosclerotic heart disease of native coronary artery without angina pectoris: Secondary | ICD-10-CM

## 2021-06-05 MED ORDER — VALSARTAN 160 MG PO TABS: 160.0000 mg | ORAL_TABLET | Freq: Every day | ORAL | 2 refills | Status: DC

## 2021-06-05 NOTE — Telephone Encounter (Signed)
Rx(s) sent to pharmacy electronically.  

## 2021-06-06 ENCOUNTER — Other Ambulatory Visit: Payer: Self-pay

## 2021-06-06 ENCOUNTER — Ambulatory Visit (INDEPENDENT_AMBULATORY_CARE_PROVIDER_SITE_OTHER): Payer: Medicare Other | Admitting: Podiatry

## 2021-06-06 ENCOUNTER — Encounter: Payer: Self-pay | Admitting: Podiatry

## 2021-06-06 DIAGNOSIS — M129 Arthropathy, unspecified: Secondary | ICD-10-CM | POA: Diagnosis not present

## 2021-06-06 DIAGNOSIS — E118 Type 2 diabetes mellitus with unspecified complications: Secondary | ICD-10-CM

## 2021-06-06 DIAGNOSIS — M2010 Hallux valgus (acquired), unspecified foot: Secondary | ICD-10-CM

## 2021-06-06 DIAGNOSIS — E1142 Type 2 diabetes mellitus with diabetic polyneuropathy: Secondary | ICD-10-CM

## 2021-06-06 NOTE — Progress Notes (Signed)
This patient presents to the office to discuss her acquiring diabetic shoes.  She received a pair of diabetic shoes last year  which were approved by [employer] Gosling.  She was previously under the care of Dr.  Loanne Drilling who never approved diabetic shoes.  She now says she a new diabetic doctor and would like to receive diabetic shoes.    Vascular  Dorsalis pedis and posterior tibial pulses are palpable  B/L.  Capillary return  WNL.  Temperature gradient is  WNL.  Skin turgor  WNL  Sensorium  Senn Weinstein monofilament wire  WNL/diminished. Normal tactile sensation.  Nail Exam  Patient has normal nails with no evidence of bacterial or fungal infection.  Orthopedic  Exam  Muscle tone and muscle strength  WNL.  No limitations of motion feet  B/L.  No crepitus or joint effusion noted.  Foot type is unremarkable and digits show no abnormalities.HAV  B/L.  Hammer toes 2-5  B/L  Skin  No open lesions.  Normal skin texture and turgor.   Diabetic neuropathy.  Discussed this problem with this patient.  She is to make an appointment with Aaron Edelman for diabetic shoes.  RTC  prn.   Gardiner Barefoot DPM

## 2021-06-17 ENCOUNTER — Telehealth: Payer: Self-pay

## 2021-06-17 NOTE — Telephone Encounter (Signed)
OK for direct colonoscopy at the [facility].

## 2021-06-17 NOTE — Telephone Encounter (Signed)
Good Day Dr. Fuller Plan:  Pt is 75 yr old  No recall sheet  Last OV 08/2019  Last colon 2017.  Does she need an office visit?  Thank you

## 2021-06-18 ENCOUNTER — Ambulatory Visit (INDEPENDENT_AMBULATORY_CARE_PROVIDER_SITE_OTHER): Payer: Medicare Other | Admitting: Internal Medicine

## 2021-06-18 ENCOUNTER — Encounter: Payer: Self-pay | Admitting: Internal Medicine

## 2021-06-18 ENCOUNTER — Other Ambulatory Visit: Payer: Self-pay

## 2021-06-18 VITALS — BP 140/90 | HR 70 | Ht 64.0 in | Wt 156.0 lb

## 2021-06-18 DIAGNOSIS — E1159 Type 2 diabetes mellitus with other circulatory complications: Secondary | ICD-10-CM | POA: Diagnosis not present

## 2021-06-18 DIAGNOSIS — E113393 Type 2 diabetes mellitus with moderate nonproliferative diabetic retinopathy without macular edema, bilateral: Secondary | ICD-10-CM

## 2021-06-18 DIAGNOSIS — E119 Type 2 diabetes mellitus without complications: Secondary | ICD-10-CM | POA: Insufficient documentation

## 2021-06-18 DIAGNOSIS — E1142 Type 2 diabetes mellitus with diabetic polyneuropathy: Secondary | ICD-10-CM | POA: Diagnosis not present

## 2021-06-18 DIAGNOSIS — E118 Type 2 diabetes mellitus with unspecified complications: Secondary | ICD-10-CM | POA: Diagnosis not present

## 2021-06-18 LAB — POCT GLYCOSYLATED HEMOGLOBIN (HGB A1C): Hemoglobin A1C: 5.8 % — AB (ref 4.0–5.6)

## 2021-06-18 LAB — MICROALBUMIN / CREATININE URINE RATIO
Creatinine,U: 61.9 mg/dL
Microalb Creat Ratio: 8.9 mg/g (ref 0.0–30.0)
Microalb, Ur: 5.5 mg/dL — ABNORMAL HIGH (ref 0.0–1.9)

## 2021-06-18 NOTE — Patient Instructions (Signed)
-   Stay Off  Metformin for now     Try Capsaicin Cream for neuropathy

## 2021-06-18 NOTE — Progress Notes (Signed)
Name: Colandra Ohanian  Age/ Sex: 75 y.o., female   MRN/ DOB: [id number], December 11, 1945     PCP: [name], FNP   Reason for Endocrinology Evaluation: Type 2 Diabetes Mellitus  Initial Endocrine Consultative Visit: 11/25/2012    PATIENT IDENTIFIER: Ms. Cinda Hara is a 75 y.o. female with a past medical history of T2DM, HTN, non-obstructive CAD , and Dyslipidemia . The patient has followed with Endocrinology clinic since 11/25/2012 for consultative assistance with management of her diabetes.  DIABETIC HISTORY:  Ms. Rossa was diagnosed with DM years ago, She has been on Metformin . Her hemoglobin A1c has ranged from 5.6% in 2021, peaking at 7.2% in 2015.  Invokana caused genital infections   Transitioned care from Dr. Loanne Drilling 05/2021   SUBJECTIVE:   During the last visit (12/26/2020): Saw Dr. Loanne Drilling , continued Metformin   Today (06/18/2021): Ms. Salsberry  is here for a follow up on diabetes management. She checks her blood sugars 1 times daily. The patient has not had hypoglycemic episodes since the last clinic visit.    Saw podiatry 06/06/2021 - needs diabetic shoes . She had a UTI 03/2021  She stopped Metformin 12/2020 as she read it causes renal failure   She has right back pain and tingling of legs - stopped Gabapentin due to drowsiness     HOME DIABETES REGIMEN:  Metformin 500 mg XR - not taking      Statin: yes ACE-I/ARB: yes    METER DOWNLOAD SUMMARY: 101-163 mg/dL    DIABETIC COMPLICATIONS: Microvascular complications:  Peripheral Neuropathy , moderate nonproliferative DR Denies: CKD  Last Eye Exam: Completed 06/2020  Macrovascular complications:  Non-Obstructive CAD - follows with Cardiology  Denies: CVA, PVD   HISTORY:  Past Medical History:  Past Medical History:  Diagnosis Date   Alkaline phosphatase deficiency    w/u Ne   Allergic rhinitis    Anxiety    Arthritis    DM2 (diabetes mellitus, type 2) (HCC)    GERD  (gastroesophageal reflux disease)    Gout    Hemorrhoids    Hyperlipidemia    Hypertension    Mild pulmonary hypertension (HCC)    NSVT (nonsustained ventricular tachycardia)    Osteoporosis    PVC's (premature ventricular contractions)    Thyroid nodule    small   Tubular adenoma of colon 2017   Urticaria    Uterine fibroid    Past Surgical History:  Past Surgical History:  Procedure Laterality Date   CATARACT EXTRACTION Bilateral 2016   COLONOSCOPY  2007   echocardiogram (other)  01/16/2002   removed tumors from foot nerves  04/1999   stress cardiolite  02/12/2006   TOENAIL EXCISION     TUBAL LIGATION     TYMPANOSTOMY TUBE PLACEMENT     Social History:  reports that she quit smoking about 53 years ago. Her smoking use included cigarettes. She has a 1.25 pack-year smoking history. She has never used smokeless tobacco. She reports that she does not drink alcohol and does not use drugs. Family History:  Family History  Problem Relation Age of Onset   Heart disease Mother    Allergic rhinitis Mother    Heart disease Father    Heart disease Maternal Grandfather    Rectal cancer Maternal Grandfather    Stomach cancer Maternal Grandmother    Colon cancer Neg Hx    Breast cancer Neg Hx      HOME MEDICATIONS: Allergies as of 06/18/2021  Reactions   Olmesartan Medoxomil    REACTION: headache        Medication List        Accurate as of June 18, 2021 10:38 AM. If you have any questions, ask your nurse or doctor.          Antacid Maximum 400 MG chewable tablet Generic drug: calcium elemental as carbonate Chew 1,000 mg by mouth as needed for heartburn.   aspirin EC 81 MG tablet Take 81 mg by mouth daily. Swallow whole.   B-12 PO Take by mouth daily.   benzonatate 100 MG capsule Commonly known as: TESSALON TAKE ONE CAPSULE BY MOUTH THREE TIMES A DAY AS NEEDED FOR COUGH   carvedilol 12.5 MG tablet Commonly known as: COREG Take 1 tablet (12.5 mg  total) by mouth 2 (two) times daily with a meal.   DULoxetine 30 MG capsule Commonly known as: CYMBALTA Take 1 capsule (30 mg total) by mouth daily.   esomeprazole 40 MG capsule Commonly known as: NEXIUM TAKE ONE CAPSULE BY MOUTH TWO TIMES A DAY. 30 TO 60 MINUTES BEFORE YOUR FIRST AND LAST MEALS OF THE DAY   famotidine 20 MG tablet Commonly known as: PEPCID Take 20 mg by mouth daily.   fesoterodine 4 MG Tb24 tablet Commonly known as: TOVIAZ Take 4 mg daily by mouth.   guaiFENesin 600 MG 12 hr tablet Commonly known as: MUCINEX Take 600 mg by mouth 2 (two) times daily as needed.   metFORMIN 500 MG 24 hr tablet Commonly known as: GLUCOPHAGE-XR TAKE TWO TABLETS BY MOUTH TWICE A DAY   montelukast 10 MG tablet Commonly known as: SINGULAIR Take 1 tablet by mouth daily.   Olopatadine HCl 0.2 % Soln Place 1 drop into both eyes daily.   OneTouch Verio Flex System w/Device Kit USE TO TEST BLOOD SUGAR DAILY AS DIRECTED   OneTouch Verio test strip Generic drug: glucose blood USE TO CHECK BLOOD SUGAE TWO TIMES A DAY   Pfizer COVID-19 Vac Bivalent injection Generic drug: COVID-19 mRNA bivalent vaccine (Pfizer) Inject into the muscle.   potassium chloride 10 MEQ tablet Commonly known as: KLOR-CON M Take 1 tablet (10 mEq total) by mouth 2 (two) times daily.   Respiratory Therapy Supplies Misc Use flutter valve as needed to break the coughing cycle.   rosuvastatin 20 MG tablet Commonly known as: CRESTOR Take 1 tablet (20 mg total) by mouth daily.   valsartan 160 MG tablet Commonly known as: DIOVAN Take 1 tablet (160 mg total) by mouth daily.   Vitamin D (Ergocalciferol) 1.25 MG (50000 UNIT) Caps capsule Commonly known as: DRISDOL TAKE ONE CAPSULE BY MOUTH ONCE WEEKLY         OBJECTIVE:   Vital Signs: BP 140/90 (BP Location: Left Arm, Patient Position: Sitting, Cuff Size: Small)    Pulse 70    Ht '5\' 4"'  (1.626 m)    Wt 156 lb (70.8 kg)    SpO2 99%    BMI 26.78 kg/m    Wt Readings from Last 3 Encounters:  06/18/21 156 lb (70.8 kg)  04/29/21 152 lb 3.2 oz (69 kg)  03/06/21 159 lb (72.1 kg)     Exam: General: Pt appears well and is in NAD  Neck: General: Supple without adenopathy. Thyroid: Thyroid size normal.  No goiter or nodules appreciated.  Lungs: Clear with good BS bilat with no rales, rhonchi, or wheezes  Heart: RRR with normal S1 and S2 and no gallops; no murmurs; no rub  Abdomen: Normoactive bowel sounds, soft, nontender, without masses or organomegaly palpable  Extremities: No pretibial edema.   Neuro: MS is good with appropriate affect, pt is alert and Ox3    DM foot exam: 06/18/2021   The skin of the feet is without sores or ulcerations. The pedal pulses are 2+ on right and 2+ on left. The sensation is decreased  to a screening 5.07, 10 gram monofilament on the left and absent at the left great toe           DATA REVIEWED:  Lab Results  Component Value Date   HGBA1C 5.8 (A) 06/18/2021   HGBA1C 5.7 (A) 12/26/2020   HGBA1C 6.2 07/26/2020   Lab Results  Component Value Date   MICROALBUR <0.7 08/12/2017   LDLCALC 88 05/13/2021   CREATININE 0.75 05/13/2021   Lab Results  Component Value Date   MICRALBCREAT 1.2 08/12/2017     Lab Results  Component Value Date   CHOL 152 05/13/2021   HDL 48 05/13/2021   LDLCALC 88 05/13/2021   LDLDIRECT 165.6 06/19/2009   TRIG 83 05/13/2021   CHOLHDL 3.2 05/13/2021         ASSESSMENT / PLAN / RECOMMENDATIONS:   1) Type 2 Diabetes Mellitus, Optimally controlled, With Retinopathic, neuropathic and macrovascular complications - Most recent A1c of 5.8 %. Goal A1c < 7.0 %.    - She has been off Metformin for months, she read online that it causes renal damage, I explained to the patient that uncontrolled diabetes causes renal failure and the metformin dose would need to be adjusted with decreased GFR.  Reassurance has been provided given that she has a normal renal function -At this  time with an A1c of 5.8%, with being off glycemic agent for approximately 4 months we have opted to hold off on any glycemic treatment.  We will consider restarting metformin if A1c increases to ~7.0%   -I have encouraged the patient to continue with low-carb diet, avoiding sugar sweetened beverages -Patient tells me that should be expecting forms from her podiatrist for new diabetic shoes  MEDICATIONS: N/a  EDUCATION / INSTRUCTIONS: BG monitoring instructions: Patient is instructed to check her blood sugars 1 times a day Call [place] [facility] if: BG persistently < 70  I reviewed the Rule of 15 for the treatment of hypoglycemia in detail with the patient. Literature supplied.    2) Diabetic complications:  Eye: Does  have known diabetic retinopathy.  Neuro/ Feet: Does  have known diabetic peripheral neuropathy .  Renal: Patient does not have known baseline CKD. She   is  on an ACEI/ARB at present.      F/U in 4 months   Signed electronically by: Mack Guise, MD  [facility] Endocrinology  [address] [facility] [address] [place], [place] [address] Phone: [phone] FAX: [phone]   CC: [name], [address] [place] [address] Phone: [phone]  Fax: [phone]  Return to Endocrinology clinic as below: Future Appointments  Date Time Provider [place]  06/18/2021 10:50 AM Pio Eatherly, Melanie Crazier, MD [facility] None  07/01/2021  9:00 AM [facility] PREVISIT RM 51 [facility]  07/08/2021  9:45 AM Little, Olevia Bowens [facility]  07/15/2021  8:30 AM Ladene Artist, MD [facility]  09/09/2021  1:15 PM Gardiner Barefoot, DPM [facility]  09/16/2021  8:00 AM Freada Bergeron, MD [facility]  09/25/2021  9:00 AM Rankin, Clent Demark,  MD [facility] None  10/28/2021  8:20 AM [name], FNP [facility]

## 2021-06-28 ENCOUNTER — Other Ambulatory Visit: Payer: Self-pay | Admitting: Family

## 2021-06-28 ENCOUNTER — Other Ambulatory Visit: Payer: Self-pay | Admitting: Internal Medicine

## 2021-06-28 ENCOUNTER — Other Ambulatory Visit: Payer: Self-pay | Admitting: Endocrinology

## 2021-06-28 DIAGNOSIS — R058 Other specified cough: Secondary | ICD-10-CM

## 2021-07-01 ENCOUNTER — Ambulatory Visit (INDEPENDENT_AMBULATORY_CARE_PROVIDER_SITE_OTHER): Payer: Commercial Managed Care - HMO | Admitting: Family Medicine

## 2021-07-01 ENCOUNTER — Other Ambulatory Visit: Payer: Self-pay

## 2021-07-01 ENCOUNTER — Ambulatory Visit (AMBULATORY_SURGERY_CENTER): Payer: Medicare Other | Admitting: *Deleted

## 2021-07-01 VITALS — Ht 64.0 in | Wt 153.0 lb

## 2021-07-01 DIAGNOSIS — M7062 Trochanteric bursitis, left hip: Secondary | ICD-10-CM

## 2021-07-01 DIAGNOSIS — M7061 Trochanteric bursitis, right hip: Secondary | ICD-10-CM | POA: Diagnosis not present

## 2021-07-01 DIAGNOSIS — Z86010 Personal history of colonic polyps: Secondary | ICD-10-CM

## 2021-07-01 DIAGNOSIS — M170 Bilateral primary osteoarthritis of knee: Secondary | ICD-10-CM

## 2021-07-01 MED ORDER — NA SULFATE-K SULFATE-MG SULF 17.5-3.13-1.6 GM/177ML PO SOLN: 1.0000 | Freq: Once | ORAL | 0 refills | Status: AC

## 2021-07-01 NOTE — Assessment & Plan Note (Addendum)
°  Chronic problem with exacerbation.  Discussed icing regimen and home exercises.  The ventricular is making a difference differential includes lumbar radiculopathy.  Patient does not want another epidural of the back we will continue with the Cymbalta.  Follow-up with me again in 6 to 8 weeks patient knows to monitor the blood sugars.  Patient likely her A1c has been at 5.7.

## 2021-07-01 NOTE — Assessment & Plan Note (Signed)
Chronic problem with exacerbation.  Discussed icing regimen and home exercises Discussed with patient A.  Increase activity slowly.  Patient could be candidate for viscosupplementation we will try to get approval.  Follow-up again in 6 weeks

## 2021-07-01 NOTE — Progress Notes (Signed)
No egg or soy allergy known to patient  No issues known to pt with past sedation with any surgeries or procedures Patient denies ever being told they had issues or difficulty with intubation  No FH of Malignant Hyperthermia Pt is not on diet pills Pt is not on  home 02  Pt is not on blood thinners  Pt denies issues with constipation  No A fib or A flutter  Pt is fully vaccinated  for Covid   Discussed with pt there will be an out-of-pocket cost for prep and that varies from $0 to 70 +  dollars - pt verbalized understanding   Due to the COVID-19 pandemic we are asking patients to follow certain guidelines in [facility] and the [place]   Pt aware of COVID protocols and [facility] guidelines

## 2021-07-01 NOTE — Progress Notes (Signed)
[facility] [address] [place] Phone: [phone] Subjective:   IVilma Meckel, am serving as a scribe for Dr. Hulan Saas. This visit occurred during the SARS-CoV-2 public health emergency.  Safety protocols were in place, including screening questions prior to the visit, additional usage of staff PPE, and extensive cleaning of exam room while observing appropriate contact time as indicated for disinfecting solutions.   I'm seeing this patient by the request  of:  [name], FNP  CC: knee pain   HUT:[id number]  Azaliah Carrero [place] is a 76 y.o. female coming in with complaint of knee pain. Hips are hurting as well. Mid back pain in lats sore. Patient states that having more increasing knee pain recently.  Sometimes feels like it is stopping her from activity.  Also does have pain on the hips.  Waking her up at night.      Past Medical History:  Diagnosis Date   Alkaline phosphatase deficiency    w/u Ne   Allergic rhinitis    Allergy    Anxiety    Arthritis    Cataract    BILATERAL-REMOVED   DM2 (diabetes mellitus, type 2) (HCC)    GERD (gastroesophageal reflux disease)    Gout    Hemorrhoids    Hyperlipidemia    Hypertension    Mild pulmonary hypertension (HCC)    NSVT (nonsustained ventricular tachycardia)    Osteoporosis    PVC's (premature ventricular contractions)    Thyroid nodule    small   Tubular adenoma of colon 2017   Urticaria    Uterine fibroid    Past Surgical History:  Procedure Laterality Date   CATARACT EXTRACTION Bilateral 2016   COLONOSCOPY  2007   echocardiogram (other)  01/16/2002   POLYPECTOMY     removed tumors from foot nerves  04/1999   stress cardiolite  02/12/2006   TOENAIL EXCISION     TUBAL LIGATION     TYMPANOSTOMY TUBE PLACEMENT     Social History   Socioeconomic History   Marital status: Widowed    Spouse name: Not on file   Number of children: 2   Years of  education: Not on file   Highest education level: Not on file  Occupational History   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    Social Determinants of Health   Financial Resource Strain: Low Risk    Difficulty of Paying Living Expenses: Not hard at all  Food Insecurity: No Food Insecurity   Worried About Charity fundraiser in the Last Year: Never true   [place] in the Last Year: Never true  Transportation Needs: No Transportation Needs   Lack of Transportation (Medical): No   Lack of Transportation (Non-Medical): No  Physical Activity: Sufficiently Active   Days of Exercise per Week: 5 days  Minutes of Exercise per Session: 30 min  Stress: No Stress Concern Present   Feeling of Stress : Not at all  Social Connections: Moderately Integrated   Frequency of Communication with Friends and Family: More than three times a week   Frequency of Social Gatherings with Friends and Family: Once a week   Attends Religious Services: More than 4 times per year   Active Member of [employer] or Organizations: No   Attends Music therapist: More than 4 times per year   Marital Status: Widowed   Allergies  Allergen Reactions   Olmesartan Medoxomil     REACTION: headache   Family History  Problem Relation Age of Onset   Heart disease Mother    Allergic rhinitis Mother    Heart disease Father    Stomach cancer Maternal Grandmother    Heart disease Maternal Grandfather    Rectal cancer Maternal Grandfather    Colon  cancer Neg Hx    Breast cancer Neg Hx    Colon polyps Neg Hx    Esophageal cancer Neg Hx     Current Outpatient Medications (Endocrine & Metabolic):    metFORMIN (GLUCOPHAGE-XR) 500 MG 24 hr tablet, TAKE TWO TABLETS BY MOUTH TWICE A DAY (Patient not taking: Reported on 07/01/2021)  Current Outpatient Medications (Cardiovascular):    carvedilol (COREG) 12.5 MG tablet, Take 1 tablet (12.5 mg total) by mouth 2 (two) times daily with a meal.   rosuvastatin (CRESTOR) 20 MG tablet, Take 1 tablet (20 mg total) by mouth daily.   valsartan (DIOVAN) 160 MG tablet, Take 1 tablet (160 mg total) by mouth daily.  Current Outpatient Medications (Respiratory):    benzonatate (TESSALON) 100 MG capsule, TAKE ONE CAPSULE BY MOUTH THREE TIMES A DAY AS NEEDED FOR COUGH (Patient not taking: Reported on 07/01/2021)   guaiFENesin (MUCINEX) 600 MG 12 hr tablet, Take 600 mg by mouth 2 (two) times daily as needed. (Patient not taking: Reported on 06/18/2021)   montelukast (SINGULAIR) 10 MG tablet, Take 1 tablet by mouth daily.  Current Outpatient Medications (Analgesics):    aspirin EC 81 MG tablet, Take 81 mg by mouth daily. Swallow whole.  Current Outpatient Medications (Hematological):    Cyanocobalamin (B-12 PO), Take by mouth daily.  Current Outpatient Medications (Other):    Blood Glucose Monitoring Suppl (ONETOUCH VERIO FLEX SYSTEM) w/Device KIT, USE TO TEST BLOOD SUGAR DAILY AS DIRECTED   calcium elemental as carbonate (ANTACID MAXIMUM) 400 MG chewable tablet, Chew 1,000 mg by mouth as needed for heartburn.   COVID-19 mRNA bivalent vaccine, Pfizer, (PFIZER COVID-19 VAC BIVALENT) injection, Inject into the muscle. (Patient not taking: Reported on 07/01/2021)   DULoxetine (CYMBALTA) 30 MG capsule, Take 1 capsule (30 mg total) by mouth daily.   esomeprazole (NEXIUM) 40 MG capsule, TAKE ONE CAPSULE BY MOUTH TWICE A DAY 30 TO 60 MINUTES BEFORE YOUR FIRST AND LAST MEALS OF THE DAY *NEED APPOINTMENT FOR FURTHER  REFILLS*   famotidine (PEPCID) 20 MG tablet, TAKE ONE TABLET BY MOUTH EVERY NIGHT AT BEDTIME   fesoterodine (TOVIAZ) 4 MG TB24 tablet, Take 4 mg daily by mouth.   glucose blood (ONETOUCH VERIO) test strip, USE TO CHECK BLOOD SUGAE TWO TIMES A DAY   Na Sulfate-K Sulfate-Mg Sulf 17.5-3.13-1.6 GM/177ML SOLN, Take 1 kit by mouth once for 1 dose. May use generic   Olopatadine HCl 0.2 % SOLN, Place 1 drop into both eyes daily.   potassium chloride (KLOR-CON) 10 MEQ tablet, Take 1 tablet (10  mEq total) by mouth 2 (two) times daily. (Patient not taking: Reported on 06/18/2021)   Respiratory Therapy Supplies MISC, Use flutter valve as needed to break the coughing cycle. (Patient not taking: Reported on 07/01/2021)   Vitamin D, Ergocalciferol, (DRISDOL) 1.25 MG (50000 UNIT) CAPS capsule, TAKE ONE CAPSULE BY MOUTH ONCE WEEKLY   Reviewed prior external information including notes and imaging from  primary care provider As well as notes that were available from care everywhere and other healthcare systems.  Past medical history, social, surgical and family history all reviewed in electronic medical record.  No pertanent information unless stated regarding to the chief complaint.   Review of Systems:  No headache, visual changes, nausea, vomiting, diarrhea, constipation, dizziness, abdominal pain, skin rash, fevers, chills, night sweats, weight loss, swollen lymph nodes, body aches, joint swelling, chest pain, shortness of breath, mood changes. POSITIVE muscle aches  Objective  Blood pressure 126/82, pulse 85, height _0  (1.626 m), weight 157 lb (71.2 kg), SpO2 96 %.   General: No apparent distress alert and oriented x3 mood and affect normal, dressed appropriately.  HEENT: Pupils equal, extraocular movements intact  Respiratory: Patient's speak in full sentences and does not appear short of breath  Cardiovascular: No lower extremity edema, non tender, no erythema  Gait antalgic MSK: Bilateral knees do  have some instability noted with valgus and varus force.  Tender to palpation diffusely on both knees.  Crepitus noted. Bilateral hips do have some tenderness to palpation as well.  Tightness with FABER test bilaterally.  After informed written and verbal consent, patient was seated on exam table. Right knee was prepped with alcohol swab and utilizing anterolateral approach, patient's right knee space was injected with 4:1  marcaine 0.5%: Kenalog 18m/dL. Patient tolerated the procedure well without immediate complications.  After informed written and verbal consent, patient was seated on exam table. Left knee was prepped with alcohol swab and utilizing anterolateral approach, patient's left knee space was injected with 4:1  marcaine 0.5%: Kenalog 45mdL. Patient tolerated the procedure well without immediate complications.  After verbal consent patient was prepped with alcohol swab and with a 21-gauge 2 inch needle injected into the right greater trochanteric area with a total of 1 cc of 0.5% Marcaine and 1 cc of Kenalog 40 mg/mL.  No blood loss.  Postinjection instructions given.  After verbal consent was prepped with alcohol swab and with a 21-gauge 2 inch needle injected into the left greater trochanteric area with a total of 1 cc of 0.5% Marcaine and 1 cc of Kenalog 40 mg/mL.  No blood loss.  Postinjection instructions given.   Impression and Recommendations:     The above documentation has been reviewed and is accurate and complete ZaLyndal PulleyDO

## 2021-07-01 NOTE — Patient Instructions (Signed)
Injections today See you again in 3 months

## 2021-07-03 ENCOUNTER — Ambulatory Visit: Payer: Medicare Other | Admitting: Endocrinology

## 2021-07-03 ENCOUNTER — Other Ambulatory Visit: Payer: Self-pay | Admitting: Internal Medicine

## 2021-07-03 ENCOUNTER — Telehealth: Payer: Self-pay | Admitting: Internal Medicine

## 2021-07-03 DIAGNOSIS — N130 Hydronephrosis with ureteropelvic junction obstruction: Secondary | ICD-10-CM | POA: Diagnosis not present

## 2021-07-03 DIAGNOSIS — N3000 Acute cystitis without hematuria: Secondary | ICD-10-CM | POA: Diagnosis not present

## 2021-07-03 MED ORDER — GABAPENTIN 300 MG PO CAPS: 300.0000 mg | ORAL_CAPSULE | Freq: Every day | ORAL | 3 refills | Status: DC

## 2021-07-03 NOTE — Telephone Encounter (Signed)
Patient call concerning over the counter medication called CAPSAICIN cream. She states this medication is burning her toes and cannot use it. Request an alternate medication. Please call patient at [phone].

## 2021-07-03 NOTE — Telephone Encounter (Signed)
Patient advise and verbalized understanding. 

## 2021-07-03 NOTE — Telephone Encounter (Signed)
Patient using  Capsaicin cream for nerve pain and would like something else because cream burns her toes.

## 2021-07-08 ENCOUNTER — Ambulatory Visit: Payer: Commercial Managed Care - HMO

## 2021-07-08 ENCOUNTER — Other Ambulatory Visit: Payer: Self-pay

## 2021-07-08 DIAGNOSIS — M2010 Hallux valgus (acquired), unspecified foot: Secondary | ICD-10-CM

## 2021-07-08 DIAGNOSIS — E1142 Type 2 diabetes mellitus with diabetic polyneuropathy: Secondary | ICD-10-CM

## 2021-07-08 NOTE — Progress Notes (Signed)
SITUATION Reason for Consult: Evaluation for Prefabricated Diabetic Shoes and Bilateral Custom Diabetic Inserts. Patient / Caregiver Report: Patient laments the lack of cute shoes  OBJECTIVE DATA: Patient History / Diagnosis:    ICD-10-CM   1. Diabetic polyneuropathy associated with type 2 diabetes mellitus (HCC)  E11.42     2. Hav (hallux abducto valgus), unspecified laterality  M20.10       Presence of Diabetic Complications: - Peripheral Neuropathy  Current or Previous Devices: None and no history  In-Person Foot Examination:  Skin presentation:   Thin, Shiny, Hairless Nail presentation:   Thick, Ingrown, With Fungus Ulcers & Callousing:   None  Shoe Size: 8.3M  ORTHOTIC RECOMMENDATION Recommended Devices: - 1x pair prefabricated PDAC approved diabetic shoes: [id number] 8.3M - 3x pair custom-to-patient direct vacuum formed diabetic insoles.   GOALS OF SHOES AND INSOLES - Reduce shear and pressure - Reduce / Prevent callus formation - Reduce / Prevent ulceration - Protect the fragile healing compromised diabetic foot.  Patient would benefit from diabetic shoes and inserts as patient has diabetes mellitus and the patient has one or more of the following conditions: - History of partial or complete amputation of the foot - History of previous foot ulceration. - History of pre-ulcerative callus - Peripheral neuropathy with evidence of callus formation - Foot deformity - Poor circulation  ACTIONS PERFORMED Patient was casted for insoles via crush box and measured for shoes via brannock device. Procedure was explained and patient tolerated procedure well. All questions were answered and concerns addressed.  PLAN Insurance to be verified and out of pocket cost communicated to patient. Once cost verified and agreed upon and diabetic certification received, casts are to be sent to [facility] for fabrication. Patient is to be called for fitting when devices are ready.

## 2021-07-09 ENCOUNTER — Encounter: Payer: Self-pay | Admitting: Gastroenterology

## 2021-07-15 ENCOUNTER — Encounter: Payer: Self-pay | Admitting: Gastroenterology

## 2021-07-15 ENCOUNTER — Ambulatory Visit (AMBULATORY_SURGERY_CENTER): Payer: Commercial Managed Care - HMO | Admitting: Gastroenterology

## 2021-07-15 ENCOUNTER — Other Ambulatory Visit: Payer: Self-pay

## 2021-07-15 VITALS — BP 133/92 | HR 62 | Temp 96.6°F | Resp 17 | Ht 64.0 in | Wt 153.0 lb

## 2021-07-15 DIAGNOSIS — D124 Benign neoplasm of descending colon: Secondary | ICD-10-CM

## 2021-07-15 DIAGNOSIS — Z86010 Personal history of colonic polyps: Secondary | ICD-10-CM

## 2021-07-15 DIAGNOSIS — D123 Benign neoplasm of transverse colon: Secondary | ICD-10-CM

## 2021-07-15 DIAGNOSIS — Z1211 Encounter for screening for malignant neoplasm of colon: Secondary | ICD-10-CM | POA: Diagnosis not present

## 2021-07-15 MED ORDER — SODIUM CHLORIDE 0.9 % IV SOLN: 500.0000 mL | Freq: Once | INTRAVENOUS | Status: DC

## 2021-07-15 NOTE — Progress Notes (Signed)
Pt's states no medical or surgical changes since previsit or office visit.Pt's states no medical or surgical changes since previsit or office visit. 

## 2021-07-15 NOTE — Progress Notes (Signed)
Called to room to assist during endoscopic procedure.  Patient ID and intended procedure confirmed with present staff. Received instructions for my participation in the procedure from the performing physician.  

## 2021-07-15 NOTE — Progress Notes (Signed)
See 06/18/2021 H&P, no changes.

## 2021-07-15 NOTE — Patient Instructions (Signed)
Handouts provided on polyps and diverticulosis.   No repeat colonoscopy due to age.   YOU HAD AN ENDOSCOPIC PROCEDURE TODAY AT [facility]:   Refer to the procedure report that was given to you for any specific questions about what was found during the examination.  If the procedure report does not answer your questions, please call your gastroenterologist to clarify.  If you requested that your care partner not be given the details of your procedure findings, then the procedure report has been included in a sealed envelope for you to review at your convenience later.  YOU SHOULD EXPECT: Some feelings of bloating in the abdomen. Passage of more gas than usual.  Walking can help get rid of the air that was put into your GI tract during the procedure and reduce the bloating. If you had a lower endoscopy (such as a colonoscopy or flexible sigmoidoscopy) you may notice spotting of blood in your stool or on the toilet paper. If you underwent a bowel prep for your procedure, you may not have a normal bowel movement for a few days.  Please Note:  You might notice some irritation and congestion in your nose or some drainage.  This is from the oxygen used during your procedure.  There is no need for concern and it should clear up in a day or so.  SYMPTOMS TO REPORT IMMEDIATELY:  Following lower endoscopy (colonoscopy or flexible sigmoidoscopy):  Excessive amounts of blood in the stool  Significant tenderness or worsening of abdominal pains  Swelling of the abdomen that is new, acute  Fever of 100F or higher  For urgent or emergent issues, a gastroenterologist can be reached at any hour by calling [phone]. Do not use MyChart messaging for urgent concerns.    DIET:  We do recommend a small meal at first, but then you may proceed to your regular diet.  Drink plenty of fluids but you should avoid alcoholic beverages for 24 hours.  ACTIVITY:  You should plan to take it easy for the  rest of today and you should NOT DRIVE or use heavy machinery until tomorrow (because of the sedation medicines used during the test).    FOLLOW UP: Our staff will call the number listed on your records 48-72 hours following your procedure to check on you and address any questions or concerns that you may have regarding the information given to you following your procedure. If we do not reach you, we will leave a message.  We will attempt to reach you two times.  During this call, we will ask if you have developed any symptoms of COVID 19. If you develop any symptoms (ie: fever, flu-like symptoms, shortness of breath, cough etc.) before then, please call [phone].  If you test positive for Covid 19 in the 2 weeks post procedure, please call and report this information to [place].    If any biopsies were taken you will be contacted by phone or by letter within the next 1-3 weeks.  Please call us at [phone] if you have not heard about the biopsies in 3 weeks.    SIGNATURES/CONFIDENTIALITY: You and/or your care partner have signed paperwork which will be entered into your electronic medical record.  These signatures attest to the fact that that the information above on your After Visit Summary has been reviewed and is understood.  Full responsibility of the confidentiality of this discharge information lies with you and/or your care-partner.

## 2021-07-15 NOTE — Op Note (Signed)
[place] Patient Name: Amy Escobar Procedure Date: 07/15/2021 7:21 AM MRN: [id number] Endoscopist: Ladene Artist , MD Age: 76 Referring MD:  Date of Birth: Apr 05, 1946 Gender: Female Account #: [id number] Procedure:                Colonoscopy Indications:              Surveillance: Personal history of adenomatous                            polyps on last colonoscopy > 5 years ago Medicines:                Monitored Anesthesia Care Procedure:                Pre-Anesthesia Assessment:                           - Prior to the procedure, a History and Physical                            was performed, and patient medications and                            allergies were reviewed. The patient's tolerance of                            previous anesthesia was also reviewed. The risks                            and benefits of the procedure and the sedation                            options and risks were discussed with the patient.                            All questions were answered, and informed consent                            was obtained. Prior Anticoagulants: The patient has                            taken no previous anticoagulant or antiplatelet                            agents. ASA Grade Assessment: III - A patient with                            severe systemic disease. After reviewing the risks                            and benefits, the patient was deemed in                            satisfactory condition to undergo the procedure.  After obtaining informed consent, the colonoscope                            was passed under direct vision. Throughout the                            procedure, the patient's blood pressure, pulse, and                            oxygen saturations were monitored continuously. The                            Olympus PCF-[id number] (#[id number]) Colonoscope was                            introduced through the  anus and advanced to the the                            cecum, identified by appendiceal orifice and                            ileocecal valve. The ileocecal valve, appendiceal                            orifice, and rectum were photographed. The quality                            of the bowel preparation was good. The colonoscopy                            was performed without difficulty. The patient                            tolerated the procedure well. Scope In: 8:06:29 AM Scope Out: 8:22:35 AM Scope Withdrawal Time: 0 hours 12 minutes 26 seconds  Total Procedure Duration: 0 hours 16 minutes 6 seconds  Findings:                 The perianal and digital rectal examinations were                            normal.                           Two sessile polyps were found in the descending                            colon and transverse colon. The polyps were 5 to 7                            mm in size. These polyps were removed with a cold                            snare. Resection and retrieval were complete.  A few small-mouthed diverticula were found in the                            left colon. There was no evidence of diverticular                            bleeding.                           The exam was otherwise without abnormality on                            direct and retroflexion views. Complications:            No immediate complications. Estimated blood loss:                            None. Estimated Blood Loss:     Estimated blood loss: none. Impression:               - Two 5 to 7 mm polyps in the descending colon and                            in the transverse colon, removed with a cold snare.                            Resected and retrieved.                           - Mild diverticulosis in the left colon.                           - The examination was otherwise normal on direct                            and retroflexion  views. Recommendation:           - Patient has a contact number available for                            emergencies. The signs and symptoms of potential                            delayed complications were discussed with the                            patient. Return to normal activities tomorrow.                            Written discharge instructions were provided to the                            patient.                           - Resume previous diet.                           -  Continue present medications.                           - Await pathology results.                           - No repeat colonoscopy due to age. Ladene Artist, MD 07/15/2021 8:25:50 AM This report has been signed electronically.

## 2021-07-15 NOTE — Progress Notes (Signed)
To pacu, VSS. Report to [email or web address] °

## 2021-07-17 ENCOUNTER — Telehealth: Payer: Self-pay

## 2021-07-17 NOTE — Telephone Encounter (Signed)
°  Follow up Call-  Call back number 07/15/2021  Post procedure Call Back phone  # [phone]  Permission to leave phone message Yes  Some recent data might be hidden     Patient questions:  Do you have a fever, pain , or abdominal swelling? No. Pain Score  0 *  Have you tolerated food without any problems? Yes.    Have you been able to return to your normal activities? Yes.    Do you have any questions about your discharge instructions: Diet   No. Medications  No. Follow up visit  No.  Do you have questions or concerns about your Care? No.  Actions: * If pain score is 4 or above: No action needed, pain <4.

## 2021-07-17 NOTE — Telephone Encounter (Signed)
First attempt follow up call to pt, no answer. 

## 2021-07-23 ENCOUNTER — Encounter: Payer: Self-pay | Admitting: Gastroenterology

## 2021-07-30 ENCOUNTER — Ambulatory Visit: Payer: Commercial Managed Care - HMO

## 2021-08-05 ENCOUNTER — Ambulatory Visit (INDEPENDENT_AMBULATORY_CARE_PROVIDER_SITE_OTHER): Payer: Medicare Other

## 2021-08-05 ENCOUNTER — Other Ambulatory Visit: Payer: Self-pay

## 2021-08-05 DIAGNOSIS — M2010 Hallux valgus (acquired), unspecified foot: Secondary | ICD-10-CM

## 2021-08-05 DIAGNOSIS — M2011 Hallux valgus (acquired), right foot: Secondary | ICD-10-CM

## 2021-08-05 DIAGNOSIS — M2012 Hallux valgus (acquired), left foot: Secondary | ICD-10-CM

## 2021-08-05 DIAGNOSIS — E1142 Type 2 diabetes mellitus with diabetic polyneuropathy: Secondary | ICD-10-CM | POA: Diagnosis not present

## 2021-08-05 NOTE — Progress Notes (Signed)
SITUATION Reason for Visit: Fitting of Diabetic Shoes & Insoles Patient / Caregiver Report:  Patient is excited about comfortable new shoes  OBJECTIVE DATA: Patient History / Diagnosis:     ICD-10-CM   1. Diabetic polyneuropathy associated with type 2 diabetes mellitus (HCC)  E11.42     2. Hav (hallux abducto valgus), unspecified laterality  M20.10       Change in Status:   None  ACTIONS PERFORMED: In-Person Delivery, patient was fit with: - 1x pair A5500 PDAC approved prefabricated Diabetic Shoes: Apex A3200W 8.57M - 3x pair [id number] PDAC approved vacuum formed custom diabetic insoles  Shoes and insoles were verified for structural integrity and safety. Patient wore shoes and insoles in office. Skin was inspected and free of areas of concern after wearing shoes and inserts. Shoes and inserts fit properly. Patient / Caregiver provided with ferbal instruction and demonstration regarding donning, doffing, wear, care, proper fit, function, purpose, cleaning, and use of shoes and insoles ' and in all related precautions and risks and benefits regarding shoes and insoles. Patient / Caregiver was instructed to wear properly fitting socks with shoes at all times. Patient was also provided with verbal instruction regarding how to report any failures or malfunctions of shoes or inserts, and necessary follow up care. Patient / Caregiver was also instructed to contact physician regarding change in status that may affect function of shoes and inserts.   Patient / Caregiver verbalized undersatnding of instruction provided. Patient / Caregiver demonstrated independence with proper donning and doffing of shoes and inserts.  PLAN Patient to follow up as needed. Plan of care was discussed with and agreed upon by patient and/or caregiver. All questions were answered and concerns addressed.

## 2021-08-11 DIAGNOSIS — H9313 Tinnitus, bilateral: Secondary | ICD-10-CM | POA: Diagnosis not present

## 2021-09-04 ENCOUNTER — Ambulatory Visit: Payer: Medicare Other | Admitting: Family Medicine

## 2021-09-04 ENCOUNTER — Other Ambulatory Visit: Payer: Self-pay

## 2021-09-04 ENCOUNTER — Encounter: Payer: Self-pay | Admitting: Emergency Medicine

## 2021-09-04 ENCOUNTER — Ambulatory Visit
Admission: EM | Admit: 2021-09-04 | Discharge: 2021-09-04 | Disposition: A | Discharge status: Home / Self Care | Payer: Medicare Other | Attending: Physician Assistant | Admitting: Physician Assistant

## 2021-09-04 DIAGNOSIS — E1159 Type 2 diabetes mellitus with other circulatory complications: Secondary | ICD-10-CM | POA: Diagnosis not present

## 2021-09-04 DIAGNOSIS — R350 Frequency of micturition: Secondary | ICD-10-CM

## 2021-09-04 DIAGNOSIS — G4489 Other headache syndrome: Secondary | ICD-10-CM | POA: Diagnosis not present

## 2021-09-04 DIAGNOSIS — J069 Acute upper respiratory infection, unspecified: Secondary | ICD-10-CM | POA: Diagnosis not present

## 2021-09-04 DIAGNOSIS — I10 Essential (primary) hypertension: Secondary | ICD-10-CM | POA: Diagnosis not present

## 2021-09-04 DIAGNOSIS — E118 Type 2 diabetes mellitus with unspecified complications: Secondary | ICD-10-CM

## 2021-09-04 LAB — POCT INFLUENZA A/B
Influenza A, POC: NEGATIVE
Influenza B, POC: NEGATIVE

## 2021-09-04 LAB — POCT FASTING CBG KUC MANUAL ENTRY: POCT Glucose (KUC): 112 mg/dL — AB (ref 70–99)

## 2021-09-04 LAB — POCT URINALYSIS DIP (MANUAL ENTRY)
Bilirubin, UA: NEGATIVE
Blood, UA: NEGATIVE
Glucose, UA: NEGATIVE mg/dL
Ketones, POC UA: NEGATIVE mg/dL
Leukocytes, UA: NEGATIVE
Nitrite, UA: NEGATIVE
Protein Ur, POC: NEGATIVE mg/dL
Spec Grav, UA: 1.02 (ref 1.010–1.025)
Urobilinogen, UA: 0.2 E.U./dL
pH, UA: 7 (ref 5.0–8.0)

## 2021-09-04 MED ORDER — IBUPROFEN 400 MG PO TABS
400.0000 mg | ORAL_TABLET | Freq: Once | ORAL | Status: AC
  Administered 2021-09-04: 11:00:00 400 mg via ORAL

## 2021-09-04 NOTE — ED Triage Notes (Signed)
Reports she woke up at 3 am with a headache, dizziness. Generalized headache. Also reports new cough last night, productive, runny nose. ?

## 2021-09-04 NOTE — ED Provider Notes (Addendum)
[facility]    CSN: [id number] Arrival date & time: 09/04/21  0857      History   Chief Complaint Chief Complaint  Patient presents with   Headache    HPI Amy Escobar is a 76 y.o. female.   For evaluation of headache, urinary frequency, body aches, cough and runny nose that started last night.  She reports that headache is generalized.  She does have some reported dizziness.  She denies any nausea, vomiting or diarrhea.  She has tried tylenol with mild relief of headache but requests medication for same.  She does report blood pressure somewhat elevated today compared to her baseline.  She denies any chest pain or shortness of breath.  She has not had any numbness or tingling and denies any weakness.  She has not had any vision changes.  The history is provided by the patient.  Headache Associated symptoms: congestion, cough, myalgias and sore throat   Associated symptoms: no abdominal pain, no diarrhea, no ear pain, no fever, no nausea and no vomiting    Past Medical History:  Diagnosis Date   Alkaline phosphatase deficiency    w/u Ne   Allergic rhinitis    Allergy    Anxiety    Arthritis    Cataract    BILATERAL-REMOVED   DM2 (diabetes mellitus, type 2) (HCC)    GERD (gastroesophageal reflux disease)    Gout    Hemorrhoids    Hyperlipidemia    Hypertension    Mild pulmonary hypertension (HCC)    NSVT (nonsustained ventricular tachycardia)    Osteoporosis    PVC's (premature ventricular contractions)    Thyroid nodule    small   Tubular adenoma of colon 2017   Urticaria    Uterine fibroid     Patient Active Problem List   Diagnosis Date Noted   Type 2 diabetes mellitus with diabetic polyneuropathy, without long-term current use of insulin ([place]) 06/18/2021   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 without macular edema, without long-term current use of insulin ([place]) 06/18/2021   Diabetes  mellitus ([place]) 06/18/2021   Osteoarthritis of right AC (acromioclavicular) joint 03/06/2021   Osteoarthritis of right glenohumeral joint 03/06/2021   Foot pain, bilateral 12/26/2020   Diabetic neuropathy ([place]) 01/10/2020   Hav (hallux abducto valgus), unspecified laterality 01/10/2020   Chronic arthropathy 01/10/2020   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 edema associated with type 2 diabetes mellitus ([place]) 12/26/2019   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   AC (acromioclavicular) arthritis 08/02/2019   Unspecified inflammatory spondylopathy, 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[place]) 04/19/2019   Claudication ([place]) 04/19/2019   Morbid obesity ([place]) 04/19/2019   Suspected COVID-19 virus infection 04/13/2019   Upper airway cough syndrome 08/22/2018   Greater trochanteric bursitis of both hips 06/15/2018   Trigger point of shoulder region, left 02/07/2018   Hypertension    Mild pulmonary hypertension (HCC)    NSVT (nonsustained ventricular tachycardia)    PVC's (premature ventricular contractions)    URI (upper respiratory infection) 08/09/2016   Neck pain 02/10/2016   Urinary incontinence 02/10/2016   Degenerative arthritis of knee, bilateral 07/17/2015   Knee MCL sprain 06/07/2015   Discomfort in chest 02/15/2015   Chest pain 02/15/2015   DM type 2, controlled, with complication ([place])    Wellness examination 08/06/2014   Acute meniscal tear of knee 02/13/2014   Primary localized osteoarthrosis, lower leg 02/13/2014   Gastrocnemius tear 12/25/2013  UTI (urinary tract infection) 12/20/2013   Right knee pain 12/20/2013   Pulmonary hypertension ([place]) 10/06/2013   DOE (dyspnea on exertion) 08/04/2013   Dysphagia, unspecified(787.20) 08/10/2012   Hip pain, left 02/02/2012   Painful respiration 05/25/2011   Encounter for long-term (current) use of other medications 01/30/2011   PELVIC PAIN, LEFT 07/30/2010   TOBACCO USE, QUIT 07/07/2010   FATIGUE  12/20/2009   Headache 12/20/2009   Low back pain 12/26/2008   Disorder of liver 04/13/2008   FOOT PAIN, BILATERAL 04/13/2008   FIBROIDS, UTERUS 11/24/2007   THYROID NODULE, LEFT 11/24/2007   HYPERCHOLESTEROLEMIA 11/24/2007   CARPAL TUNNEL SYNDROME, BILATERAL 11/24/2007   ALKALINE PHOSPHATASE, ELEVATED 11/24/2007   Gout 08/10/2007   ANXIETY 08/10/2007   Essential hypertension 08/10/2007   Cough 08/01/2007   Perennial and seasonal allergic rhinitis 01/14/2007   GERD 01/14/2007   Osteoporosis 01/14/2007    Past Surgical History:  Procedure Laterality Date   CATARACT EXTRACTION Bilateral 2016   COLONOSCOPY  2007   echocardiogram (other)  01/16/2002   POLYPECTOMY     removed tumors from foot nerves  04/1999   stress cardiolite  02/12/2006   TOENAIL EXCISION     TUBAL LIGATION     TYMPANOSTOMY TUBE PLACEMENT      OB History   No obstetric history on file.      Home Medications    Prior to Admission medications   Medication Sig Start Date End Date Taking? Authorizing Provider  aspirin EC 81 MG tablet Take 81 mg by mouth daily. Swallow whole.    [provider]  benzonatate (TESSALON) 100 MG capsule TAKE ONE CAPSULE BY MOUTH THREE TIMES A DAY AS NEEDED FOR COUGH Patient not taking: Reported on 07/01/2021 05/08/21   Tanda Rockers, MD  Blood Glucose Monitoring Suppl (ONETOUCH VERIO FLEX SYSTEM) w/Device KIT USE TO TEST BLOOD SUGAR DAILY AS DIRECTED 11/11/20   Renato Shin, MD  calcium elemental as carbonate (ANTACID MAXIMUM) 400 MG chewable tablet Chew 1,000 mg by mouth as needed for heartburn.    [provider]  carvedilol (COREG) 12.5 MG tablet Take 1 tablet (12.5 mg total) by mouth 2 (two) times daily with a meal. 11/18/20   Bhagat, Bhavinkumar, PA  cephALEXin (KEFLEX) 250 MG capsule Take 250 mg by mouth daily. 07/01/21   [provider]  COVID-19 mRNA bivalent vaccine, Pfizer, (PFIZER COVID-19 VAC BIVALENT) injection Inject into the muscle. Patient  not taking: Reported on 07/01/2021 04/29/21   Carlyle Basques, MD  Cyanocobalamin (B-12 PO) Take by mouth daily.    [provider]  DULoxetine (CYMBALTA) 30 MG capsule Take 1 capsule (30 mg total) by mouth daily. 10/24/20   [name], FNP  esomeprazole (NEXIUM) 40 MG capsule TAKE ONE CAPSULE BY MOUTH TWICE A DAY 30 TO 60 MINUTES BEFORE YOUR FIRST AND LAST MEALS OF THE DAY *NEED APPOINTMENT FOR FURTHER REFILLS* 07/01/21   Tanda Rockers, MD  famotidine (PEPCID) 20 MG tablet TAKE ONE TABLET BY MOUTH EVERY NIGHT AT BEDTIME 07/01/21   Tanda Rockers, MD  fesoterodine (TOVIAZ) 4 MG TB24 tablet Take 4 mg daily by mouth.    [provider]  gabapentin (NEURONTIN) 300 MG capsule Take 1 capsule (300 mg total) by mouth at bedtime. 07/03/21   Shamleffer, Melanie Crazier, MD  glucose blood (ONETOUCH VERIO) test strip USE TO CHECK BLOOD SUGAE TWO TIMES A DAY 01/15/21   Renato Shin, MD  guaiFENesin (MUCINEX) 600 MG 12 hr tablet Take  600 mg by mouth 2 (two) times daily as needed. Patient not taking: Reported on 06/18/2021    [provider]  metFORMIN (GLUCOPHAGE-XR) 500 MG 24 hr tablet TAKE TWO TABLETS BY MOUTH TWICE A DAY 07/01/21   Renato Shin, MD  montelukast (SINGULAIR) 10 MG tablet Take 1 tablet by mouth daily. 08/31/20   [provider]  Olopatadine HCl 0.2 % SOLN Place 1 drop into both eyes daily. 04/07/21 04/07/22  Rankin, Clent Demark, MD  potassium chloride (KLOR-CON) 10 MEQ tablet Take 1 tablet (10 mEq total) by mouth 2 (two) times daily. Patient not taking: Reported on 06/18/2021 02/11/21   [name], FNP  Respiratory Therapy Supplies MISC Use flutter valve as needed to break the coughing cycle. Patient not taking: Reported on 07/01/2021 06/26/19   Bobbitt, [place] Muta, MD  rosuvastatin (CRESTOR) 20 MG tablet Take 1 tablet (20 mg total) by mouth daily. 05/15/21 08/13/21  Loel Dubonnet, NP  valsartan (DIOVAN) 160 MG tablet Take 1 tablet (160 mg total)  by mouth daily. 06/05/21   Freada Bergeron, MD  Vitamin D, Ergocalciferol, (DRISDOL) 1.25 MG (50000 UNIT) CAPS capsule TAKE ONE CAPSULE BY MOUTH ONCE WEEKLY 05/19/21   Lyndal Pulley, DO    Family History Family History  Problem Relation Age of Onset   Heart disease Mother    Allergic rhinitis Mother    Heart disease Father    Stomach cancer Maternal Grandmother    Heart disease Maternal Grandfather    Rectal cancer Maternal Grandfather    Colon cancer Neg Hx    Breast cancer Neg Hx    Colon polyps Neg Hx    Esophageal cancer Neg Hx     Social History Social History   Tobacco Use   Smoking status: Former    Packs/day: 0.25    Years: 5.00    Pack years: 1.25    Types: Cigarettes    Quit date: 06/29/1968    Years since quitting: 53.2   Smokeless tobacco: Never  Vaping Use   Vaping Use: Never used  Substance Use Topics   Alcohol use: No    Alcohol/week: 0.0 standard drinks   Drug use: No     Allergies   Olmesartan medoxomil   Review of Systems Review of Systems  Constitutional:  Negative for chills and fever.  HENT:  Positive for congestion and sore throat. Negative for ear pain.   Eyes:  Negative for discharge and redness.  Respiratory:  Positive for cough. Negative for shortness of breath and wheezing.   Gastrointestinal:  Negative for abdominal pain, diarrhea, nausea and vomiting.  Musculoskeletal:  Positive for myalgias.  Neurological:  Positive for headaches.    Physical Exam Triage Vital Signs ED Triage Vitals  Enc Vitals Group     BP 09/04/21 0917 (!) 163/92     Pulse Rate 09/04/21 0917 96     Resp 09/04/21 0917 16     Temp 09/04/21 0917 98.5 F (36.9 C)     Temp Source 09/04/21 0917 Oral     SpO2 09/04/21 0917 95 %     Weight --      Height --      Head Circumference --      Peak Flow --      Pain Score 09/04/21 0918 10     Pain Loc --      Pain Edu? --      Excl. in [place]? --    No data found.  Updated Vital Signs BP (!) 163/92 (BP  Location: Left Arm)    Pulse 96    Temp 98.5 F (36.9 C) (Oral)    Resp 16    SpO2 95%      Physical Exam Vitals and nursing note reviewed.  Constitutional:      General: She is not in acute distress.    Appearance: Normal appearance. She is not ill-appearing.  HENT:     Head: Normocephalic and atraumatic.     Nose: Congestion present.     Mouth/Throat:     Mouth: Mucous membranes are moist.     Pharynx: No oropharyngeal exudate or posterior oropharyngeal erythema.  Eyes:     Extraocular Movements: Extraocular movements intact.     Conjunctiva/sclera: Conjunctivae normal.     Pupils: Pupils are equal, round, and reactive to light.  Cardiovascular:     Rate and Rhythm: Normal rate and regular rhythm.     Heart sounds: Normal heart sounds. No murmur heard. Pulmonary:     Effort: Pulmonary effort is normal. No respiratory distress.     Breath sounds: Normal breath sounds. No wheezing, rhonchi or rales.  Skin:    General: Skin is warm and dry.  Neurological:     General: No focal deficit present.     Mental Status: She is alert.     Coordination: Coordination normal.     Comments: No facial droop, normal speech, no pronator drift, normal finger-nose  Psychiatric:        Mood and Affect: Mood normal.        Thought Content: Thought content normal.     UC Treatments / Results  Labs (all labs ordered are listed, but only abnormal results are displayed) Labs Reviewed  POCT FASTING CBG [facility] MANUAL ENTRY - Abnormal; Notable for the following components:      Result Value   POCT Glucose ([facility]) 112 (*)    All other components within normal limits  NOVEL CORONAVIRUS, NAA  POCT INFLUENZA A/B  POCT URINALYSIS DIP (MANUAL ENTRY)    EKG   Radiology No results found.  Procedures Procedures (including critical care time)  Medications Ordered in UC Medications  ibuprofen (ADVIL) tablet 400 mg (400 mg Oral Given 09/04/21 1037)    Initial Impression / Assessment and Plan / UC  Course  I have reviewed the triage vital signs and the nursing notes.  Pertinent labs & imaging results that were available during my care of the patient were reviewed by me and considered in my medical decision making (see chart for details).    Ibuprofen administered in office to hopefully help with headache.  Suspect likely viral etiology of symptoms, will screen for COVID.  Recommended further evaluation in the emergency department with any worsening symptoms given age and comorbidities.  At this time there are no neurological concerns.  Final Clinical Impressions(s) / UC Diagnoses   Final diagnoses:  Acute upper respiratory infection  Essential hypertension  DM type 2, controlled, with complication ([place])  Other headache syndrome  Urinary frequency   Discharge Instructions   None    ED Prescriptions   None    PDMP not reviewed this encounter.   Francene Finders, PA-C 09/04/21 1045    Ewell Poe F, PA-C 09/04/21 1046

## 2021-09-05 ENCOUNTER — Other Ambulatory Visit: Payer: Self-pay | Admitting: Family

## 2021-09-05 ENCOUNTER — Telehealth: Payer: Self-pay | Admitting: Family

## 2021-09-05 ENCOUNTER — Ambulatory Visit: Payer: Medicare Other | Admitting: Podiatry

## 2021-09-05 LAB — NOVEL CORONAVIRUS, NAA: SARS-CoV-2, NAA: DETECTED — AB

## 2021-09-05 MED ORDER — MOLNUPIRAVIR EUA 200MG CAPSULE: 4.0000 | ORAL_CAPSULE | Freq: Two times a day (BID) | ORAL | 0 refills | Status: AC

## 2021-09-05 NOTE — Telephone Encounter (Signed)
I have called pt and relayed the message from provider. Pt stated that she will get someone to pick it up.   ?

## 2021-09-05 NOTE — Telephone Encounter (Signed)
Pt was seen at [facility] yesterday, and tested + for covid. They recommended she call her pcp to get antiviral. Please advise.  ? ?[facility] [id number]  ?[place], [place] [address]  ?Phone:  [phone]  Fax:  [phone]  ?

## 2021-09-05 NOTE — Telephone Encounter (Signed)
Please let her know that we called in [place] for her to take to treat her COVID; take 4 tablets twice a day x 5 days;  ?

## 2021-09-05 NOTE — Telephone Encounter (Signed)
We have called the pt and informed her that the medication Molnupiravir for her to take to treat her COVID; take 4 tablets twice a day x 5 days; She stated understanding and will get someone to get it for her.  ?

## 2021-09-08 ENCOUNTER — Ambulatory Visit: Payer: Medicare Other | Admitting: Family Medicine

## 2021-09-09 ENCOUNTER — Ambulatory Visit: Payer: Medicare Other | Admitting: Podiatry

## 2021-09-11 ENCOUNTER — Ambulatory Visit
Admission: EM | Admit: 2021-09-11 | Discharge: 2021-09-11 | Disposition: A | Discharge status: Home / Self Care | Payer: Medicare Other

## 2021-09-11 ENCOUNTER — Other Ambulatory Visit: Payer: Self-pay

## 2021-09-11 NOTE — ED Notes (Signed)
Pt was just here to be tested again for Covid. We advised her she was only 7 days out and really do not need to be tested bc the test will most likely come back positive. No symptoms. 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 Advised to stay away for a few more days for safety of the elder lady.  ?

## 2021-09-12 NOTE — Progress Notes (Deleted)
?Cardiology Office Note:   ? ?Date:  09/12/2021  ? ?ID:  Amy Escobar, DOB [birth year], MRN [id number] ? ?PCP:  [name], [place] ?  ?[facility] Providers ?Cardiologist:  Freada Bergeron, MD { ?  ? ?Referring MD: [name],*  ? ? ?History of Present Illness:   ? ?Amy Escobar is a 76 y.o. female with a hx of hypertension, hyperlipidemia, insulin-dependent diabetes, moderate pulmonary hypertension, moderate nonobstructive coronary disease who previously followed with Dr. Meda Coffee who now returns to clinic for follow-up. ? ?Per review of the record,coronary CTA in 2018 with calcium score of 220 placing her in the 81st percentile for age and sex matched control.  There was moderate plaque in the ostial left main and mild plaque in the ostial RCA. She was seen August 2020 by Dr. Meda Coffee noting dyspnea on exertion and chest pain.  TTE 03/10/2019 normal LVEF 55 to 60%, borderline enlarged pulmonary artery, normal RVSP. Myoview 02/2019 normal. ? ?Past Medical History:  ?Diagnosis Date  ? Alkaline phosphatase deficiency   ? w/u Ne  ? Allergic rhinitis   ? Allergy   ? Anxiety   ? Arthritis   ? Cataract   ? BILATERAL-REMOVED  ? DM2 (diabetes mellitus, type 2) ([place])   ? GERD (gastroesophageal reflux disease)   ? Gout   ? Hemorrhoids   ? Hyperlipidemia   ? Hypertension   ? Mild pulmonary hypertension ([place])   ? NSVT (nonsustained ventricular tachycardia)   ? Osteoporosis   ? PVC's (premature ventricular contractions)   ? Thyroid nodule   ? small  ? Tubular adenoma of colon 2017  ? Urticaria   ? Uterine fibroid   ? ? ?Past Surgical History:  ?Procedure Laterality Date  ? CATARACT EXTRACTION Bilateral 2016  ? COLONOSCOPY  2007  ? echocardiogram (other)  01/16/2002  ? POLYPECTOMY    ? removed tumors from foot nerves  04/1999  ? stress cardiolite  02/12/2006  ? TOENAIL EXCISION    ? TUBAL LIGATION    ? TYMPANOSTOMY TUBE PLACEMENT    ? ? ?Current Medications: ?No outpatient medications have been marked  as taking for the 09/16/21 encounter (Appointment) with Freada Bergeron, MD.  ?  ? ?Allergies:   Olmesartan medoxomil  ? ?Social History  ? ?Socioeconomic History  ? Marital status: Widowed  ?  Spouse name: Not on file  ? Number of children: 2  ? Years of education: Not on file  ? Highest education level: Not on file  ?Occupational History  ? Occupation: OTC CLERK  ?  Employer: UNEMPLOYED  ?Tobacco Use  ? Smoking status: Former  ?  Packs/day: 0.25  ?  Years: 5.00  ?  Pack years: 1.25  ?  Types: Cigarettes  ?  Quit date: 06/29/1968  ?  Years since quitting: 53.2  ? Smokeless tobacco: Never  ?Vaping Use  ? Vaping Use: Never used  ?Substance and Sexual Activity  ? Alcohol use: No  ?  Alcohol/week: 0.0 standard drinks  ? Drug use: No  ? Sexual activity: Not on file  ?Other Topics Concern  ? Not on file  ?Social History Narrative  ? Widowed 2010.   ? ?Social Determinants of Health  ? ?Financial Resource Strain: Not on file  ?Food Insecurity: Not on file  ?Transportation Needs: Not on file  ?Physical Activity: Not on file  ?Stress: Not on file  ?Social Connections: Not on file  ?  ? ?Family History: ?The patient's ***family  history includes Allergic rhinitis in her mother; Heart disease in her father, maternal grandfather, and mother; Rectal cancer in her maternal grandfather; Stomach cancer in her maternal grandmother. There is no history of Colon cancer, Breast cancer, Colon polyps, or Esophageal cancer. ? ?ROS:   ?Please see the history of present illness.    ?*** All other systems reviewed and are negative. ? ?EKGs/Labs/Other Studies Reviewed:   ? ?The following studies were reviewed today: ?TTE 02/2019: ?IMPRESSIONS  ? 1. The left ventricle has normal systolic function, with an ejection  ?fraction of 55-60%. The cavity size was normal. There is mild concentric  ?left ventricular hypertrophy. Left ventricular diastolic Doppler  ?parameters are indeterminate. No evidence of  ?left ventricular regional wall motion  abnormalities.  ? 2. Trivial pericardial effusion is present.  ? 3. The aortic valve is tricuspid. No stenosis of the aortic valve.  ? 4. The aorta is normal unless otherwise noted.  ? 5. The aortic root, ascending aorta and aortic arch are normal in size  ?and structure.  ? 6. Borderline enlarged pulmonary artery.  ? ?02/2019: ?Nuclear stress EF: 59%. ?There was no ST segment deviation noted during stress. ?The study is normal. ?This is a low risk study. ?The left ventricular ejection fraction is normal (55-65%). ?  ?Normal stress nuclear study with no ischemia or infarction.  Gated ejection fraction 59% with normal wall motion. ? ?EKG:  EKG is *** ordered today.  The ekg ordered today demonstrates *** ? ?Recent Labs: ?02/07/2021: Hemoglobin 11.1; Platelets 194.0 ?05/13/2021: ALT 19; BUN 12; Creatinine, Ser 0.75; Potassium 3.9; Sodium 140  ?Recent Lipid Panel ?   ?Component Value Date/Time  ? CHOL 152 05/13/2021 0000  ? TRIG 83 05/13/2021 0000  ? TRIG 48 05/03/2006 0733  ? HDL 48 05/13/2021 0000  ? CHOLHDL 3.2 05/13/2021 0000  ? CHOLHDL 3 07/04/2018 1554  ? VLDL 12.8 07/04/2018 1554  ? [place] 88 05/13/2021 0000  ? LDLDIRECT 165.6 06/19/2009 0731  ? ? ? ?Risk Assessment/Calculations:   ?{Does this patient have ATRIAL FIBRILLATION?:[id number]} ? ?    ? ?Physical Exam:   ? ?VS:  There were no vitals taken for this visit.   ? ?Wt Readings from Last 3 Encounters:  ?07/15/21 153 lb (69.4 kg)  ?07/01/21 157 lb (71.2 kg)  ?07/01/21 153 lb (69.4 kg)  ?  ? ?GEN: *** Well nourished, well developed in no acute distress ?HEENT: Normal ?NECK: No JVD; No carotid bruits ?LYMPHATICS: No lymphadenopathy ?CARDIAC: ***RRR, no murmurs, rubs, gallops ?RESPIRATORY:  Clear to auscultation without rales, wheezing or rhonchi  ?ABDOMEN: Soft, non-tender, non-distended ?MUSCULOSKELETAL:  No edema; No deformity  ?SKIN: Warm and dry ?NEUROLOGIC:  Alert and oriented x 3 ?PSYCHIATRIC:  Normal affect  ? ?ASSESSMENT:   ? ?No diagnosis found. ?PLAN:    ? ?In order of problems listed above: ? ?#Nonobstructive CAD: ?Coronary CTA 10/2016 with 25-50% ostial RCA, 50-69% ostial LM, 25-50% prox LAD. FFR negative. Ca score 220 (81% for age, gender matched controls). Most recent myoview in 06/2018 low risk. No anginal symptoms.  ?-Continue crestor '20mg'$  daily ? ?#HTN: ?-Continue coreg 12.'5mg'$  BID ?-Continue valsartan '160mg'$  daily ? ?#Pulmonary HTN: ?TTE 02/2019 with low normal systolic function. No significant symptoms. ? ?#HLD: ?-Continue crestor '20mg'$  daily ?-Goal LDL<70 ?   ? ?{Are you ordering a CV Procedure (e.g. stress test, cath, DCCV, TEE, etc)?   Press F2        :[id number]}  ? ? ?Medication Adjustments/Labs and Tests Ordered: ?Current  medicines are reviewed at length with the patient today.  Concerns regarding medicines are outlined above.  ?No orders of the defined types were placed in this encounter. ? ?No orders of the defined types were placed in this encounter. ? ? ?There are no Patient Instructions on file for this visit.  ? ?Signed, ?Freada Bergeron, MD  ?09/12/2021 11:25 AM    ?[place] ?

## 2021-09-15 NOTE — Progress Notes (Signed)
?Cardiology Office Note:   ? ?Date:  09/16/2021  ? ?ID:  Amy Escobar, DOB 06/01/1946, MRN [id number] ? ?PCP:  [name], [place] ?  ?[facility] Providers ?Cardiologist:  Freada Bergeron, MD { ?  ? ?Referring MD: [name],*  ? ? ?History of Present Illness:   ? ?Amy Escobar is a 76 y.o. female with a hx of hypertension, hyperlipidemia, insulin-dependent diabetes, moderate pulmonary hypertension, moderate nonobstructive coronary disease who previously followed with Dr. Meda Coffee who now returns to clinic for follow-up. ? ?Per review of the record,coronary CTA in 2018 with calcium score of 220 placing her in the 81st percentile for age and sex matched control.  There was moderate plaque in the ostial left main and mild plaque in the ostial RCA. She was seen August 2020 by Dr. Meda Coffee noting dyspnea on exertion and chest pain. TTE 03/10/2019 normal LVEF 55 to 60%, borderline enlarged pulmonary artery, normal RVSP. Myoview 02/2019 normal. ? ?Today, she is doing well from a CV standpoint. She was recently infected with COVID this past month. She was given molnupiravir and Mucinex for congestion. She reports she did not have symptoms and is recovering well.  ? ?She records her blood pressure at home and reports an average of  540 systolic. No episodes of low blood pressure. Compliant with all medications. ? ?Otherwise, she denies any exertional chest pain. Has some SOB with climbing stairs but is able to walk on flat ground without issue. No nausea, vomiting, diaphoresis.  ? ?She denies palpitations, PND, orthopnea, or leg swelling. Denies cough, fever, chills. Denies nausea, vomiting. Denies syncope or presyncope. Denies snoring. ? ?Past Medical History:  ?Diagnosis Date  ? Alkaline phosphatase deficiency   ? w/u Ne  ? Allergic rhinitis   ? Allergy   ? Anxiety   ? Arthritis   ? Cataract   ? BILATERAL-REMOVED  ? DM2 (diabetes mellitus, type 2) ([place])   ? GERD (gastroesophageal reflux  disease)   ? Gout   ? Hemorrhoids   ? Hyperlipidemia   ? Hypertension   ? Mild pulmonary hypertension ([place])   ? NSVT (nonsustained ventricular tachycardia)   ? Osteoporosis   ? PVC's (premature ventricular contractions)   ? Thyroid nodule   ? small  ? Tubular adenoma of colon 2017  ? Urticaria   ? Uterine fibroid   ? ? ?Past Surgical History:  ?Procedure Laterality Date  ? CATARACT EXTRACTION Bilateral 2016  ? COLONOSCOPY  2007  ? echocardiogram (other)  01/16/2002  ? POLYPECTOMY    ? removed tumors from foot nerves  04/1999  ? stress cardiolite  02/12/2006  ? TOENAIL EXCISION    ? TUBAL LIGATION    ? TYMPANOSTOMY TUBE PLACEMENT    ? ? ?Current Medications: ?Current Meds  ?Medication Sig  ? aspirin EC 81 MG tablet Take 81 mg by mouth daily. Swallow whole.  ? benzonatate (TESSALON) 100 MG capsule TAKE ONE CAPSULE BY MOUTH THREE TIMES A DAY AS NEEDED FOR COUGH  ? Blood Glucose Monitoring Suppl (ONETOUCH VERIO FLEX SYSTEM) w/Device KIT USE TO TEST BLOOD SUGAR DAILY AS DIRECTED  ? calcium elemental as carbonate (ANTACID MAXIMUM) 400 MG chewable tablet Chew 1,000 mg by mouth as needed for heartburn.  ? carvedilol (COREG) 12.5 MG tablet Take 1 tablet (12.5 mg total) by mouth 2 (two) times daily with a meal.  ? cephALEXin (KEFLEX) 250 MG capsule Take 250 mg by mouth daily.  ? COVID-19 mRNA bivalent vaccine, Pfizer, (  PFIZER COVID-19 VAC BIVALENT) injection Inject into the muscle.  ? Cyanocobalamin (B-12 PO) Take by mouth daily.  ? DULoxetine (CYMBALTA) 30 MG capsule Take 1 capsule (30 mg total) by mouth daily.  ? esomeprazole (NEXIUM) 40 MG capsule TAKE ONE CAPSULE BY MOUTH TWICE A DAY 30 TO 60 MINUTES BEFORE YOUR FIRST AND LAST MEALS OF THE DAY *NEED APPOINTMENT FOR FURTHER REFILLS*  ? famotidine (PEPCID) 20 MG tablet TAKE ONE TABLET BY MOUTH EVERY NIGHT AT BEDTIME  ? fesoterodine (TOVIAZ) 4 MG TB24 tablet Take 4 mg daily by mouth.  ? gabapentin (NEURONTIN) 300 MG capsule Take 1 capsule (300 mg total) by mouth at bedtime.   ? glucose blood (ONETOUCH VERIO) test strip USE TO CHECK BLOOD SUGAE TWO TIMES A DAY  ? guaiFENesin (MUCINEX) 600 MG 12 hr tablet Take 600 mg by mouth 2 (two) times daily as needed.  ? metFORMIN (GLUCOPHAGE-XR) 500 MG 24 hr tablet TAKE TWO TABLETS BY MOUTH TWICE A DAY  ? montelukast (SINGULAIR) 10 MG tablet Take 1 tablet by mouth daily.  ? Olopatadine HCl 0.2 % SOLN Place 1 drop into both eyes daily.  ? potassium chloride (KLOR-CON) 10 MEQ tablet Take 1 tablet (10 mEq total) by mouth 2 (two) times daily.  ? Respiratory Therapy Supplies MISC Use flutter valve as needed to break the coughing cycle.  ? valsartan (DIOVAN) 160 MG tablet Take 1 tablet (160 mg total) by mouth daily.  ? Vitamin D, Ergocalciferol, (DRISDOL) 1.25 MG (50000 UNIT) CAPS capsule TAKE ONE CAPSULE BY MOUTH ONCE WEEKLY  ?  ? ?Allergies:   Olmesartan medoxomil  ? ?Social History  ? ?Socioeconomic History  ? Marital status: Widowed  ?  Spouse name: Not on file  ? Number of children: 2  ? Years of education: Not on file  ? Highest education level: Not on file  ?Occupational History  ? Occupation: OTC CLERK  ?  Employer: UNEMPLOYED  ?Tobacco Use  ? Smoking status: Former  ?  Packs/day: 0.25  ?  Years: 5.00  ?  Pack years: 1.25  ?  Types: Cigarettes  ?  Quit date: 06/29/1968  ?  Years since quitting: 53.2  ? Smokeless tobacco: Never  ?Vaping Use  ? Vaping Use: Never used  ?Substance and Sexual Activity  ? Alcohol use: No  ?  Alcohol/week: 0.0 standard drinks  ? Drug use: No  ? Sexual activity: Not on file  ?Other Topics Concern  ? Not on file  ?Social History Narrative  ? Widowed 2010.   ? ?Social Determinants of Health  ? ?Financial Resource Strain: Not on file  ?Food Insecurity: Not on file  ?Transportation Needs: Not on file  ?Physical Activity: Not on file  ?Stress: Not on file  ?Social Connections: Not on file  ?  ? ?Family History: ?The patient's family history includes Allergic rhinitis in her mother; Heart disease in her father, maternal  grandfather, and mother; Rectal cancer in her maternal grandfather; Stomach cancer in her maternal grandmother. There is no history of Colon cancer, Breast cancer, Colon polyps, or Esophageal cancer. ? ?ROS:   ?Please see the history of present illness.    ?Review of Systems  ?Constitutional:  Negative for fever and malaise/fatigue.  ?HENT:  Negative for congestion and sore throat.   ?Eyes:  Negative for blurred vision.  ?Respiratory:  Positive for shortness of breath (exertional). Negative for cough.   ?Cardiovascular:  Positive for chest pain (Upper L chest). Negative for palpitations, orthopnea, claudication,  leg swelling and PND.  ?Gastrointestinal:  Negative for heartburn and nausea.  ?Genitourinary:  Negative for dysuria and urgency.  ?Musculoskeletal:  Negative for joint pain and myalgias.  ?Skin:  Negative for itching and rash.  ?Neurological:  Negative for dizziness and headaches.  ?Endo/Heme/Allergies:  Does not bruise/bleed easily.  ?Psychiatric/Behavioral:  The patient is not nervous/anxious and does not have insomnia.   ?All other systems reviewed and are negative. ? ?EKGs/Labs/Other Studies Reviewed:   ? ?The following studies were reviewed today: ?TTE 02/2019: ?IMPRESSIONS  ? 1. The left ventricle has normal systolic function, with an ejection  ?fraction of 55-60%. The cavity size was normal. There is mild concentric  ?left ventricular hypertrophy. Left ventricular diastolic Doppler  ?parameters are indeterminate. No evidence of  ?left ventricular regional wall motion abnormalities.  ? 2. Trivial pericardial effusion is present.  ? 3. The aortic valve is tricuspid. No stenosis of the aortic valve.  ? 4. The aorta is normal unless otherwise noted.  ? 5. The aortic root, ascending aorta and aortic arch are normal in size  ?and structure.  ? 6. Borderline enlarged pulmonary artery.  ? ?Myoview 02/2019: ?Nuclear stress EF: 59%. ?There was no ST segment deviation noted during stress. ?The study is  normal. ?This is a low risk study. ?The left ventricular ejection fraction is normal (55-65%). ?  ?Normal stress nuclear study with no ischemia or infarction.  Gated ejection fraction 59% with normal wall motion. ? ?EKG: EK

## 2021-09-16 ENCOUNTER — Encounter (INDEPENDENT_AMBULATORY_CARE_PROVIDER_SITE_OTHER): Payer: Self-pay

## 2021-09-16 ENCOUNTER — Ambulatory Visit (INDEPENDENT_AMBULATORY_CARE_PROVIDER_SITE_OTHER): Payer: Medicare Other | Admitting: Cardiology

## 2021-09-16 ENCOUNTER — Other Ambulatory Visit: Payer: Self-pay

## 2021-09-16 ENCOUNTER — Ambulatory Visit: Payer: Medicare Other | Admitting: Family

## 2021-09-16 ENCOUNTER — Ambulatory Visit (INDEPENDENT_AMBULATORY_CARE_PROVIDER_SITE_OTHER): Payer: Medicare Other | Admitting: Family

## 2021-09-16 ENCOUNTER — Encounter: Payer: Self-pay | Admitting: Family

## 2021-09-16 ENCOUNTER — Encounter: Payer: Self-pay | Admitting: Cardiology

## 2021-09-16 VITALS — BP 128/86 | HR 90 | Ht 64.0 in | Wt 161.0 lb

## 2021-09-16 VITALS — BP 140/80 | HR 91 | Temp 98.8°F | Resp 98 | Ht 64.0 in | Wt 163.0 lb

## 2021-09-16 DIAGNOSIS — E538 Deficiency of other specified B group vitamins: Secondary | ICD-10-CM | POA: Diagnosis not present

## 2021-09-16 DIAGNOSIS — E785 Hyperlipidemia, unspecified: Secondary | ICD-10-CM

## 2021-09-16 DIAGNOSIS — E118 Type 2 diabetes mellitus with unspecified complications: Secondary | ICD-10-CM

## 2021-09-16 DIAGNOSIS — I10 Essential (primary) hypertension: Secondary | ICD-10-CM | POA: Diagnosis not present

## 2021-09-16 DIAGNOSIS — U071 COVID-19: Secondary | ICD-10-CM | POA: Diagnosis not present

## 2021-09-16 DIAGNOSIS — I25118 Atherosclerotic heart disease of native coronary artery with other forms of angina pectoris: Secondary | ICD-10-CM | POA: Diagnosis not present

## 2021-09-16 DIAGNOSIS — I272 Pulmonary hypertension, unspecified: Secondary | ICD-10-CM | POA: Diagnosis not present

## 2021-09-16 DIAGNOSIS — E876 Hypokalemia: Secondary | ICD-10-CM

## 2021-09-16 LAB — CBC WITH DIFFERENTIAL/PLATELET
Basophils Absolute: 0 10*3/uL (ref 0.0–0.1)
Basophils Relative: 0.3 % (ref 0.0–3.0)
Eosinophils Absolute: 0.1 10*3/uL (ref 0.0–0.7)
Eosinophils Relative: 1.6 % (ref 0.0–5.0)
HCT: 34.6 % — ABNORMAL LOW (ref 36.0–46.0)
Hemoglobin: 11.4 g/dL — ABNORMAL LOW (ref 12.0–15.0)
Lymphocytes Relative: 36.1 % (ref 12.0–46.0)
Lymphs Abs: 2.6 10*3/uL (ref 0.7–4.0)
MCHC: 32.9 g/dL (ref 30.0–36.0)
MCV: 86 fl (ref 78.0–100.0)
Monocytes Absolute: 0.7 10*3/uL (ref 0.1–1.0)
Monocytes Relative: 9.6 % (ref 3.0–12.0)
Neutro Abs: 3.7 10*3/uL (ref 1.4–7.7)
Neutrophils Relative %: 52.4 % (ref 43.0–77.0)
Platelets: 227 10*3/uL (ref 150.0–400.0)
RBC: 4.02 Mil/uL (ref 3.87–5.11)
RDW: 14.5 % (ref 11.5–15.5)
WBC: 7.1 10*3/uL (ref 4.0–10.5)

## 2021-09-16 LAB — LIPID PANEL
Cholesterol: 132 mg/dL (ref 0–200)
HDL: 41.2 mg/dL (ref >39.00)
LDL Cholesterol: 71 mg/dL (ref 0–99)
NonHDL: 91.04
Total CHOL/HDL Ratio: 3
Triglycerides: 99 mg/dL (ref 0.0–149.0)
VLDL: 19.8 mg/dL (ref 0.0–40.0)

## 2021-09-16 LAB — COMPREHENSIVE METABOLIC PANEL
ALT: 22 U/L (ref 0–35)
AST: 23 U/L (ref 0–37)
Albumin: 3.8 g/dL (ref 3.5–5.2)
Alkaline Phosphatase: 80 U/L (ref 39–117)
BUN: 12 mg/dL (ref 6–23)
CO2: 30 mEq/L (ref 19–32)
Calcium: 9 mg/dL (ref 8.4–10.5)
Chloride: 101 mEq/L (ref 96–112)
Creatinine, Ser: 0.76 mg/dL (ref 0.40–1.20)
GFR: 76.26 mL/min (ref >60.00)
Glucose, Bld: 100 mg/dL — ABNORMAL HIGH (ref 70–99)
Potassium: 3.9 mEq/L (ref 3.5–5.1)
Sodium: 139 mEq/L (ref 135–145)
Total Bilirubin: 0.3 mg/dL (ref 0.2–1.2)
Total Protein: 6.9 g/dL (ref 6.0–8.3)

## 2021-09-16 LAB — HEMOGLOBIN A1C: Hgb A1c MFr Bld: 5.9 % (ref 4.6–6.5)

## 2021-09-16 LAB — VITAMIN B12: Vitamin B-12: 971 pg/mL — ABNORMAL HIGH (ref 211–911)

## 2021-09-16 NOTE — Progress Notes (Signed)
?Amy Escobar is a 76 y.o. female with the following history as recorded in EpicCare:  ?Patient Active Problem List  ? Diagnosis Date Noted  ? Type 2 diabetes mellitus with diabetic polyneuropathy, without long-term current use of insulin ([place]) 06/18/2021  ? 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 without macular edema, without long-term current use of insulin ([place]) 06/18/2021  ? Diabetes mellitus ([place]) 06/18/2021  ? Osteoarthritis of right AC (acromioclavicular) joint 03/06/2021  ? Osteoarthritis of right glenohumeral joint 03/06/2021  ? Foot pain, bilateral 12/26/2020  ? Diabetic neuropathy ([place]) 01/10/2020  ? Hav (hallux abducto valgus), unspecified laterality 01/10/2020  ? Chronic arthropathy 01/10/2020  ? 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 edema associated with type 2 diabetes mellitus ([place]) 12/26/2019  ? 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  ? AC (acromioclavicular) arthritis 08/02/2019  ? Unspecified inflammatory spondylopathy, cervical region [facility]) 04/19/2019  ? Claudication ([place]) 04/19/2019  ? Morbid obesity ([place]) 04/19/2019  ? Suspected COVID-19 virus infection 04/13/2019  ? Upper airway cough syndrome 08/22/2018  ? Greater trochanteric bursitis of both hips 06/15/2018  ? Trigger point of shoulder region, left 02/07/2018  ? Hypertension   ? Mild pulmonary hypertension ([place])   ? NSVT (nonsustained ventricular tachycardia)   ? PVC's (premature ventricular contractions)   ? URI (upper respiratory infection) 08/09/2016  ? Neck pain 02/10/2016  ? Urinary incontinence 02/10/2016  ? Degenerative arthritis of knee, bilateral 07/17/2015  ? Knee MCL sprain 06/07/2015  ? Discomfort in chest 02/15/2015  ? Chest pain 02/15/2015  ? DM type 2, controlled, with complication ([place])   ? Wellness examination 08/06/2014  ? Acute meniscal tear of knee 02/13/2014  ? Primary localized osteoarthrosis, lower leg 02/13/2014  ?  Gastrocnemius tear 12/25/2013  ? UTI (urinary tract infection) 12/20/2013  ? Right knee pain 12/20/2013  ? Pulmonary hypertension ([place]) 10/06/2013  ? DOE (dyspnea on exertion) 08/04/2013  ? Dysphagia, unspecified(787.20) 08/10/2012  ? Hip pain, left 02/02/2012  ? Painful respiration 05/25/2011  ? Encounter for long-term (current) use of other medications 01/30/2011  ? PELVIC PAIN, LEFT 07/30/2010  ? TOBACCO USE, QUIT 07/07/2010  ? FATIGUE 12/20/2009  ? Headache 12/20/2009  ? Low back pain 12/26/2008  ? Disorder of liver 04/13/2008  ? FOOT PAIN, BILATERAL 04/13/2008  ? FIBROIDS, UTERUS 11/24/2007  ? THYROID NODULE, LEFT 11/24/2007  ? HYPERCHOLESTEROLEMIA 11/24/2007  ? CARPAL TUNNEL SYNDROME, BILATERAL 11/24/2007  ? ALKALINE PHOSPHATASE, ELEVATED 11/24/2007  ? Gout 08/10/2007  ? ANXIETY 08/10/2007  ? Essential hypertension 08/10/2007  ? Cough 08/01/2007  ? Perennial and seasonal allergic rhinitis 01/14/2007  ? GERD 01/14/2007  ? Osteoporosis 01/14/2007  ?  ?Current Outpatient Medications  ?Medication Sig Dispense Refill  ? aspirin EC 81 MG tablet Take 81 mg by mouth daily. Swallow whole.    ? Blood Glucose Monitoring Suppl (ONETOUCH VERIO FLEX SYSTEM) w/Device KIT USE TO TEST BLOOD SUGAR DAILY AS DIRECTED 1 kit 0  ? calcium elemental as carbonate (ANTACID MAXIMUM) 400 MG chewable tablet Chew 1,000 mg by mouth as needed for heartburn.    ? carvedilol (COREG) 12.5 MG tablet Take 1 tablet (12.5 mg total) by mouth 2 (two) times daily with a meal. 180 tablet 3  ? cholecalciferol (VITAMIN D3) 25 MCG (1000 UNIT) tablet Take 1,000 Units by mouth daily.    ? Cyanocobalamin (B-12 PO) Take by mouth daily.    ? esomeprazole (NEXIUM) 40 MG capsule TAKE ONE CAPSULE BY MOUTH TWICE  A DAY 30 TO 60 MINUTES BEFORE YOUR FIRST AND LAST MEALS OF THE DAY *NEED APPOINTMENT FOR FURTHER REFILLS* 180 capsule 0  ? famotidine (PEPCID) 20 MG tablet TAKE ONE TABLET BY MOUTH EVERY NIGHT AT BEDTIME 90 tablet 0  ? fesoterodine (TOVIAZ) 4 MG TB24  tablet Take 4 mg daily by mouth.    ? gabapentin (NEURONTIN) 300 MG capsule Take 1 capsule (300 mg total) by mouth at bedtime. 90 capsule 3  ? glucose blood (ONETOUCH VERIO) test strip USE TO CHECK BLOOD SUGAE TWO TIMES A DAY 100 strip 3  ? guaiFENesin (MUCINEX) 600 MG 12 hr tablet Take 600 mg by mouth 2 (two) times daily as needed.    ? metFORMIN (GLUCOPHAGE-XR) 500 MG 24 hr tablet TAKE TWO TABLETS BY MOUTH TWICE A DAY 360 tablet 2  ? montelukast (SINGULAIR) 10 MG tablet Take 1 tablet by mouth daily.    ? Olopatadine HCl 0.2 % SOLN Place 1 drop into both eyes daily. 1.5 mL 11  ? Respiratory Therapy Supplies MISC Use flutter valve as needed to break the coughing cycle. 1 each 1  ? valsartan (DIOVAN) 160 MG tablet Take 1 tablet (160 mg total) by mouth daily. 90 tablet 2  ? benzonatate (TESSALON) 100 MG capsule TAKE ONE CAPSULE BY MOUTH THREE TIMES A DAY AS NEEDED FOR COUGH (Patient not taking: Reported on 09/16/2021) 45 capsule 0  ? rosuvastatin (CRESTOR) 20 MG tablet Take 1 tablet (20 mg total) by mouth daily. 90 tablet 3  ? ?No current facility-administered medications for this visit.  ?  ?Allergies: Olmesartan medoxomil  ?Past Medical History:  ?Diagnosis Date  ? Alkaline phosphatase deficiency   ? w/u Ne  ? Allergic rhinitis   ? Allergy   ? Anxiety   ? Arthritis   ? Cataract   ? BILATERAL-REMOVED  ? DM2 (diabetes mellitus, type 2) ([place])   ? GERD (gastroesophageal reflux disease)   ? Gout   ? Hemorrhoids   ? Hyperlipidemia   ? Hypertension   ? Mild pulmonary hypertension ([place])   ? NSVT (nonsustained ventricular tachycardia)   ? Osteoporosis   ? PVC's (premature ventricular contractions)   ? Thyroid nodule   ? small  ? Tubular adenoma of colon 2017  ? Urticaria   ? Uterine fibroid   ?  ?Past Surgical History:  ?Procedure Laterality Date  ? CATARACT EXTRACTION Bilateral 2016  ? COLONOSCOPY  2007  ? echocardiogram (other)  01/16/2002  ? POLYPECTOMY    ? removed tumors from foot nerves  04/1999  ? stress cardiolite   02/12/2006  ? TOENAIL EXCISION    ? TUBAL LIGATION    ? TYMPANOSTOMY TUBE PLACEMENT    ?  ?Family History  ?Problem Relation Age of Onset  ? Heart disease Mother   ? Allergic rhinitis Mother   ? Heart disease Father   ? Stomach cancer Maternal Grandmother   ? Heart disease Maternal Grandfather   ? Rectal cancer Maternal Grandfather   ? Colon cancer Neg Hx   ? Breast cancer Neg Hx   ? Colon polyps Neg Hx   ? Esophageal cancer Neg Hx   ?  ?Social History  ? ?Tobacco Use  ? Smoking status: Former  ?  Packs/day: 0.25  ?  Years: 5.00  ?  Pack years: 1.25  ?  Types: Cigarettes  ?  Quit date: 06/29/1968  ?  Years since quitting: 53.2  ? Smokeless tobacco: Never  ?Substance Use Topics  ?  Alcohol use: No  ?  Alcohol/week: 0.0 standard drinks  ?  ?Subjective:  ?Patient has documented chronic low potassium; requesting to have her levels checked today; not currently taking potassium supplement;  ?No specific concerns today otherwise;  ? ? ? ?Objective:  ?Vitals:  ? 09/16/21 1259  ?BP: 140/80  ?Pulse: 91  ?Resp: (!) 98  ?Temp: 98.8 ?F (37.1 ?C)  ?TempSrc: Oral  ?Weight: 163 lb (73.9 kg)  ?Height: '5\' 4"'  (1.626 m)  ?  ?General: Well developed, well nourished, in no acute distress  ?Skin : Warm and dry.  ?Head: Normocephalic and atraumatic  ?Lungs: Respirations unlabored; clear to auscultation bilaterally without wheeze, rales, rhonchi  ?CVS exam: normal rate and regular rhythm.  ?Neurologic: Alert and oriented; speech intact; face symmetrical; moves all extremities well; CNII-XII intact without focal deficit  ? ?Assessment:  ?1. Essential hypertension   ?2. Hypokalemia   ?3. Low vitamin B12 level   ?4. Hyperlipidemia, unspecified hyperlipidemia type   ?5. DM type 2, controlled, with complication ([place])   ?  ?Plan:  ?Will update labs per patient request; will go ahead and check lipid panel per planned visit with cardiologist and share those results;  ?Congratulated patient on commitment to her health;  ?Follow up in 6 months;  ? ?This  visit occurred during the SARS-CoV-2 public health emergency.  Safety protocols were in place, including screening questions prior to the visit, additional usage of staff PPE, and extensive cleaning of exam

## 2021-09-16 NOTE — Patient Instructions (Addendum)
Medication Instructions:  ? ?Your physician recommends that you continue on your current medications as directed. Please refer to the Current Medication list given to you today. ? ?*If you need a refill on your cardiac medications before your next appointment, please call your pharmacy* ? ? ?LAB:  tomorrow or sometime this week--fasting lipids ? ?Follow-Up: ?At [facility], you and your health needs are our priority.  As part of our continuing mission to provide you with exceptional heart care, we have created designated Provider Care Teams.  These Care Teams include your primary Cardiologist (physician) and Advanced Practice Providers (APPs -  Physician Assistants and Nurse Practitioners) who all work together to provide you with the care you need, when you need it. ? ?We recommend signing up for the patient portal called "MyChart".  Sign up information is provided on this After Visit Summary.  MyChart is used to connect with patients for Virtual Visits (Telemedicine).  Patients are able to view lab/test results, encounter notes, upcoming appointments, etc.  Non-urgent messages can be sent to your provider as well.   ?To learn more about what you can do with MyChart, go to [email or web address].   ? ?Your next appointment:   ?6 month(s) ? ?The format for your next appointment:   ?In Person ? ?Provider:   ?Freada Bergeron, MD { ? ? ?

## 2021-09-18 ENCOUNTER — Telehealth: Payer: Self-pay | Admitting: Family

## 2021-09-18 NOTE — Telephone Encounter (Signed)
Pt would like to go over results. Advised her results were mailed, but she stated if a nurse was able to speak with on the phone as well, she would appreciate it.  ?

## 2021-09-18 NOTE — Telephone Encounter (Signed)
I have called pt back and relayed the message. She stated understanding and is happy with the results. ?

## 2021-09-19 ENCOUNTER — Other Ambulatory Visit: Payer: Medicare Other

## 2021-09-25 ENCOUNTER — Encounter (INDEPENDENT_AMBULATORY_CARE_PROVIDER_SITE_OTHER): Payer: Medicare Other | Admitting: Ophthalmology

## 2021-09-25 NOTE — Progress Notes (Signed)
?Charlann Boxer D.O. ?[facility] ?[place] ?Phone: [phone] ?Subjective:   ?I, Amy Escobar, am serving as a Education administrator for Dr. Hulan Saas. ?This visit occurred during the SARS-CoV-2 public health emergency.  Safety protocols were in place, including screening questions prior to the visit, additional usage of staff PPE, and extensive cleaning of exam room while observing appropriate contact time as indicated for disinfecting solutions.  ? ?I'm seeing this patient by the request  of:  [name], FNP ? ?CC: Bilateral knee and bilateral hip pain ? ?TDV:[id number]  ?07/01/2021 ?Chronic problem with exacerbation.  Discussed icing regimen and home exercises.  The ventricular is making a difference differential includes lumbar radiculopathy.  Patient does not want another epidural of the back we will continue with the Cymbalta.  Follow-up with me again in 6 to 8 weeks patient knows to monitor the blood sugars.  Patient likely her A1c has been at 5.7. ? ?Chronic problem with exacerbation.  Discussed icing regimen and home exercises ?Discussed with patient ?A.  Increase activity slowly.  Patient could be candidate for viscosupplementation we will try to get approval.  Follow-up again in 6 weeks ? ?Update 09/30/2021 ?Amy Escobar is a 76 y.o. female coming in with complaint of B knee and B hip pain. Patient states that she had COVID on March 9th and has been achy and tired. Would like to get injections today. Had MRI cervical in February 2023.  ? ?  ? ?Past Medical History:  ?Diagnosis Date  ? Alkaline phosphatase deficiency   ? w/u Ne  ? Allergic rhinitis   ? Allergy   ? Anxiety   ? Arthritis   ? Cataract   ? BILATERAL-REMOVED  ? DM2 (diabetes mellitus, type 2) ([place])   ? GERD (gastroesophageal reflux disease)   ? Gout   ? Hemorrhoids   ? Hyperlipidemia   ? Hypertension   ? Mild pulmonary hypertension ([place])   ? NSVT (nonsustained ventricular tachycardia)   ? Osteoporosis    ? PVC's (premature ventricular contractions)   ? Thyroid nodule   ? small  ? Tubular adenoma of colon 2017  ? Urticaria   ? Uterine fibroid   ? ?Past Surgical History:  ?Procedure Laterality Date  ? CATARACT EXTRACTION Bilateral 2016  ? COLONOSCOPY  2007  ? echocardiogram (other)  01/16/2002  ? POLYPECTOMY    ? removed tumors from foot nerves  04/1999  ? stress cardiolite  02/12/2006  ? TOENAIL EXCISION    ? TUBAL LIGATION    ? TYMPANOSTOMY TUBE PLACEMENT    ? ?Social History  ? ?Socioeconomic History  ? Marital status: Widowed  ?  Spouse name: Not on file  ? Number of children: 2  ? Years of education: Not on file  ? Highest education level: Not on file  ?Occupational History  ? Occupation: OTC CLERK  ?  Employer: UNEMPLOYED  ?Tobacco Use  ? Smoking status: Former  ?  Packs/day: 0.25  ?  Years: 5.00  ?  Pack years: 1.25  ?  Types: Cigarettes  ?  Quit date: 06/29/1968  ?  Years since quitting: 53.2  ? Smokeless tobacco: Never  ?Vaping Use  ? Vaping Use: Never used  ?Substance and Sexual Activity  ? Alcohol use: No  ?  Alcohol/week: 0.0 standard drinks  ? Drug use: No  ? Sexual activity: Not on file  ?Other Topics Concern  ? Not on file  ?Social History Narrative  ?  Widowed 2010.   ? ?Social Determinants of Health  ? ?Financial Resource Strain: Not on file  ?Food Insecurity: Not on file  ?Transportation Needs: Not on file  ?Physical Activity: Not on file  ?Stress: Not on file  ?Social Connections: Not on file  ? ?Allergies  ?Allergen Reactions  ? Olmesartan Medoxomil   ?  REACTION: headache  ? ?Family History  ?Problem Relation Age of Onset  ? Heart disease Mother   ? Allergic rhinitis Mother   ? Heart disease Father   ? Stomach cancer Maternal Grandmother   ? Heart disease Maternal Grandfather   ? Rectal cancer Maternal Grandfather   ? Colon cancer Neg Hx   ? Breast cancer Neg Hx   ? Colon polyps Neg Hx   ? Esophageal cancer Neg Hx   ? ? ?Current Outpatient Medications (Endocrine & Metabolic):  ?  metFORMIN  (GLUCOPHAGE-XR) 500 MG 24 hr tablet, TAKE TWO TABLETS BY MOUTH TWICE A DAY ? ?Current Outpatient Medications (Cardiovascular):  ?  carvedilol (COREG) 12.5 MG tablet, Take 1 tablet (12.5 mg total) by mouth 2 (two) times daily with a meal. ?  valsartan (DIOVAN) 160 MG tablet, Take 1 tablet (160 mg total) by mouth daily. ?  rosuvastatin (CRESTOR) 20 MG tablet, Take 1 tablet (20 mg total) by mouth daily. ? ?Current Outpatient Medications (Respiratory):  ?  benzonatate (TESSALON) 100 MG capsule, TAKE ONE CAPSULE BY MOUTH THREE TIMES A DAY AS NEEDED FOR COUGH ?  guaiFENesin (MUCINEX) 600 MG 12 hr tablet, Take 600 mg by mouth 2 (two) times daily as needed. ?  montelukast (SINGULAIR) 10 MG tablet, Take 1 tablet by mouth daily. ? ?Current Outpatient Medications (Analgesics):  ?  aspirin EC 81 MG tablet, Take 81 mg by mouth daily. Swallow whole. ? ?Current Outpatient Medications (Hematological):  ?  Cyanocobalamin (B-12 PO), Take by mouth daily. ? ?Current Outpatient Medications (Other):  ?  Blood Glucose Monitoring Suppl (ONETOUCH VERIO FLEX SYSTEM) w/Device KIT, USE TO TEST BLOOD SUGAR DAILY AS DIRECTED ?  calcium elemental as carbonate (ANTACID MAXIMUM) 400 MG chewable tablet, Chew 1,000 mg by mouth as needed for heartburn. ?  cholecalciferol (VITAMIN D3) 25 MCG (1000 UNIT) tablet, Take 1,000 Units by mouth daily. ?  esomeprazole (NEXIUM) 40 MG capsule, TAKE ONE CAPSULE BY MOUTH TWICE A DAY 30 TO 60 MINUTES BEFORE YOUR FIRST AND LAST MEALS OF THE DAY *NEED APPOINTMENT FOR FURTHER REFILLS* ?  famotidine (PEPCID) 20 MG tablet, TAKE ONE TABLET BY MOUTH EVERY NIGHT AT BEDTIME ?  fesoterodine (TOVIAZ) 4 MG TB24 tablet, Take 4 mg daily by mouth. ?  gabapentin (NEURONTIN) 300 MG capsule, Take 1 capsule (300 mg total) by mouth at bedtime. ?  glucose blood (ONETOUCH VERIO) test strip, USE TO CHECK BLOOD SUGAE TWO TIMES A DAY ?  Olopatadine HCl 0.2 % SOLN, Place 1 drop into both eyes daily. ?  Respiratory Therapy Supplies MISC, Use  flutter valve as needed to break the coughing cycle. ? ? ?Reviewed prior external information including notes and imaging from  ?primary care provider ?As well as notes that were available from care everywhere and other healthcare systems. ? ?Past medical history, social, surgical and family history all reviewed in electronic medical record.  No pertanent information unless stated regarding to the chief complaint.  ? ?Review of Systems: ? No headache, visual changes, nausea, vomiting, diarrhea, constipation, dizziness, abdominal pain, skin rash, fevers, chills, night sweats, weight loss, swollen lymph nodes, joint swelling, chest pain,  shortness of breath, mood changes. POSITIVE muscle aches, body aches ? ?Objective  ?Blood pressure 116/82, pulse 95, height '5\' 4"'  (1.626 m), weight 163 lb (73.9 kg), SpO2 98 %. ?  ?General: No apparent distress alert and oriented x3 mood and affect normal, dressed appropriately.  ?HEENT: Pupils equal, extraocular movements intact  ?Respiratory: Patient's speak in full sentences and does not appear short of breath  ?Cardiovascular: No lower extremity edema, non tender, no erythema  ?Gait antalgic gait ?Severe tenderness to palpation of the greater trochanteric area bilaterally.  No pain in the groin with internal or external range of motion. ?Patient does have arthritic changes of the knees bilaterally.  Trace effusion bilaterally.  Instability with valgus and varus force. ? ?After informed written and verbal consent, patient was seated on exam table. Right knee was prepped with alcohol swab and utilizing anterolateral approach, patient's right knee space was injected with 4:1  marcaine 0.5%: Kenalog 63m/dL. Patient tolerated the procedure well without immediate complications. ? ?After informed written and verbal consent, patient was seated on exam table. Left knee was prepped with alcohol swab and utilizing anterolateral approach, patient's left knee space was injected with 4:1   marcaine 0.5%: Kenalog 470mdL. Patient tolerated the procedure well without immediate complications. ? ?After verbal consent patient was prepped with alcohol swabs and with a 21-gauge 2 inch needle injected with

## 2021-09-26 ENCOUNTER — Other Ambulatory Visit: Payer: Self-pay | Admitting: Family

## 2021-09-26 ENCOUNTER — Other Ambulatory Visit: Payer: Self-pay | Admitting: Internal Medicine

## 2021-09-26 ENCOUNTER — Other Ambulatory Visit: Payer: Self-pay | Admitting: Endocrinology

## 2021-09-26 DIAGNOSIS — R058 Other specified cough: Secondary | ICD-10-CM

## 2021-09-29 ENCOUNTER — Other Ambulatory Visit: Payer: Self-pay | Admitting: *Deleted

## 2021-09-29 DIAGNOSIS — I10 Essential (primary) hypertension: Secondary | ICD-10-CM

## 2021-09-29 DIAGNOSIS — I2510 Atherosclerotic heart disease of native coronary artery without angina pectoris: Secondary | ICD-10-CM

## 2021-09-29 DIAGNOSIS — E782 Mixed hyperlipidemia: Secondary | ICD-10-CM

## 2021-09-29 MED ORDER — VALSARTAN 160 MG PO TABS: 160.0000 mg | ORAL_TABLET | Freq: Every day | ORAL | 2 refills | Status: DC

## 2021-09-30 ENCOUNTER — Encounter (INDEPENDENT_AMBULATORY_CARE_PROVIDER_SITE_OTHER): Payer: Medicare Other | Admitting: Ophthalmology

## 2021-09-30 ENCOUNTER — Ambulatory Visit (INDEPENDENT_AMBULATORY_CARE_PROVIDER_SITE_OTHER): Payer: Medicare Other | Admitting: Family Medicine

## 2021-09-30 DIAGNOSIS — M7061 Trochanteric bursitis, right hip: Secondary | ICD-10-CM | POA: Diagnosis not present

## 2021-09-30 DIAGNOSIS — M7062 Trochanteric bursitis, left hip: Secondary | ICD-10-CM | POA: Diagnosis not present

## 2021-09-30 DIAGNOSIS — M170 Bilateral primary osteoarthritis of knee: Secondary | ICD-10-CM | POA: Diagnosis not present

## 2021-09-30 NOTE — Assessment & Plan Note (Signed)
Chronic, with exacerbation.  Has had this difficulty for some time.  Discussed the possibility of a lumbar radiculopathy with radicular symptoms.  Discussed which activities to do which ones to avoid.  Follow-up again in 3 months ?

## 2021-09-30 NOTE — Assessment & Plan Note (Signed)
Patient given injection today and tolerated the procedure well, discussed icing regimen and home exercise, discussed which activities to do which wants to avoid.  Discussed which activities to potentially avoid.  Patient does not want to do the home exercises on a regular basis at the moment.  Follow-up with me again 6 to 8 weeks.  Can repeat every 3 months if needed ?

## 2021-09-30 NOTE — Patient Instructions (Addendum)
Good to see you! ? ?Injections in hips and knees today ?See you again in 10 weeks ?

## 2021-10-02 ENCOUNTER — Ambulatory Visit (INDEPENDENT_AMBULATORY_CARE_PROVIDER_SITE_OTHER): Payer: Medicare Other | Admitting: Ophthalmology

## 2021-10-02 ENCOUNTER — Other Ambulatory Visit: Payer: Self-pay

## 2021-10-02 ENCOUNTER — Encounter (INDEPENDENT_AMBULATORY_CARE_PROVIDER_SITE_OTHER): Payer: Self-pay | Admitting: Ophthalmology

## 2021-10-02 ENCOUNTER — Encounter (INDEPENDENT_AMBULATORY_CARE_PROVIDER_SITE_OTHER): Payer: Medicare Other | Admitting: Ophthalmology

## 2021-10-02 DIAGNOSIS — E113393 Type 2 diabetes mellitus with moderate nonproliferative diabetic retinopathy without macular edema, bilateral: Secondary | ICD-10-CM

## 2021-10-02 DIAGNOSIS — H35413 Lattice degeneration of retina, bilateral: Secondary | ICD-10-CM

## 2021-10-02 NOTE — Assessment & Plan Note (Addendum)
The nature of moderate nonproliferative diabetic retinopathy was discussed with the patient as well as the need for more frequent follow up to judge for progression. Good blood glucose, blood pressure, and serum lipid control was recommended as well as avoidance of smoking and maintenance of normal weight.  Close follow up with PCP encouraged. ?No progression of moderate NPDR OU ? ?The nature of diabetic retinopathy was explained using the following analogy: ?"Retinopathy develops in the body's blood supply like salty water corrodes the linings of pipes in a house, until rust appears, then holes in the pipes develop which leak followed by destruction and loss of the pipes as the corrosion turns them to dust. In a similar fashion, Diabetes damages the blood supply of the body by cumulative long--term elevated blood sugar, which corrodes the blood supply in the body, particularly the blood vessels supplying the retina, kidneys, and nerves?.  Thus, control of blood sugar, slows the progression of the corrosive effect of diabetes mellitus. ? ? ? ?

## 2021-10-02 NOTE — Assessment & Plan Note (Signed)
No changes, no holes or tears ?

## 2021-10-02 NOTE — Progress Notes (Signed)
? ? ?10/02/2021 ? ?  ? ?CHIEF COMPLAINT ?Patient presents for  ?Chief Complaint  ?Patient presents with  ? Diabetic Retinopathy without Macular Edema  ? ? ? ? ?HISTORY OF PRESENT ILLNESS: ?Amy Escobar is a 76 y.o. female who presents to the clinic today for:  ? ?HPI   ?1 yr fu OU oct FP. ?Pt states she has had headaches that end up in left eye, they started about two months ago, she reports she had COVID around March 1st, but the headaches stopped after having COVID. Patient states vision is stable and unchanged since last visit. Denies any new floaters or FOL. ? LBS: 112 this morning ?A1C: "6.2, 2 months ago," patient reports. ?Last edited by Laurin Coder on 10/02/2021  8:54 AM.  ?  ? ? ?Referring physician: ?[name], [facility] ?[address] ?[address] ?[place],  [place] [address] ? ?HISTORICAL INFORMATION:  ? ?Selected notes from the [facility] ?  ? ?Lab Results  ?Component Value Date  ? HGBA1C 5.9 09/16/2021  ?  ? ?CURRENT MEDICATIONS: ?Current Outpatient Medications (Ophthalmic Drugs)  ?Medication Sig  ? Olopatadine HCl 0.2 % SOLN Place 1 drop into both eyes daily.  ? ?No current facility-administered medications for this visit. (Ophthalmic Drugs)  ? ?Current Outpatient Medications (Other)  ?Medication Sig  ? aspirin EC 81 MG tablet Take 81 mg by mouth daily. Swallow whole.  ? benzonatate (TESSALON) 100 MG capsule TAKE ONE CAPSULE BY MOUTH THREE TIMES A DAY AS NEEDED FOR COUGH  ? Blood Glucose Monitoring Suppl (ONETOUCH VERIO FLEX SYSTEM) w/Device KIT USE TO TEST BLOOD SUGAR DAILY AS DIRECTED  ? calcium elemental as carbonate (ANTACID MAXIMUM) 400 MG chewable tablet Chew 1,000 mg by mouth as needed for heartburn.  ? carvedilol (COREG) 12.5 MG tablet Take 1 tablet (12.5 mg total) by mouth 2 (two) times daily with a meal.  ? cholecalciferol (VITAMIN D3) 25 MCG (1000 UNIT) tablet Take 1,000 Units by mouth daily.  ? Cyanocobalamin (B-12 PO) Take by mouth daily.  ? esomeprazole (NEXIUM) 40  MG capsule TAKE ONE CAPSULE BY MOUTH TWICE A DAY 30 TO 60 MINUTES BEFORE YOUR FIRST AND LAST MEALS OF THE DAY *NEED APPOINTMENT FOR FURTHER REFILLS*  ? famotidine (PEPCID) 20 MG tablet TAKE ONE TABLET BY MOUTH EVERY NIGHT AT BEDTIME  ? fesoterodine (TOVIAZ) 4 MG TB24 tablet Take 4 mg daily by mouth.  ? gabapentin (NEURONTIN) 300 MG capsule Take 1 capsule (300 mg total) by mouth at bedtime.  ? glucose blood (ONETOUCH VERIO) test strip USE TO CHECK BLOOD SUGAE TWO TIMES A DAY  ? guaiFENesin (MUCINEX) 600 MG 12 hr tablet Take 600 mg by mouth 2 (two) times daily as needed.  ? metFORMIN (GLUCOPHAGE-XR) 500 MG 24 hr tablet TAKE TWO TABLETS BY MOUTH TWICE A DAY  ? montelukast (SINGULAIR) 10 MG tablet Take 1 tablet by mouth daily.  ? Respiratory Therapy Supplies MISC Use flutter valve as needed to break the coughing cycle.  ? rosuvastatin (CRESTOR) 20 MG tablet Take 1 tablet (20 mg total) by mouth daily.  ? valsartan (DIOVAN) 160 MG tablet Take 1 tablet (160 mg total) by mouth daily.  ? ?No current facility-administered medications for this visit. (Other)  ? ? ? ? ?REVIEW OF SYSTEMS: ?ROS   ?Negative for: Constitutional, Gastrointestinal, Neurological, Skin, Genitourinary, Musculoskeletal, HENT, Endocrine, Cardiovascular, Eyes, Respiratory, Psychiatric, Allergic/Imm, Heme/Lymph ?Last edited by Hurman Horn, MD on 10/02/2021  9:30 AM.  ?  ? ? ? ?  ALLERGIES ?Allergies  ?Allergen Reactions  ? Olmesartan Medoxomil   ?  REACTION: headache  ? ? ?PAST MEDICAL HISTORY ?Past Medical History:  ?Diagnosis Date  ? Alkaline phosphatase deficiency   ? w/u Ne  ? Allergic rhinitis   ? Allergy   ? Anxiety   ? Arthritis   ? Cataract   ? BILATERAL-REMOVED  ? DM2 (diabetes mellitus, type 2) ([place])   ? GERD (gastroesophageal reflux disease)   ? Gout   ? Hemorrhoids   ? Hyperlipidemia   ? Hypertension   ? Mild pulmonary hypertension ([place])   ? NSVT (nonsustained ventricular tachycardia) ([place])   ? Osteoporosis   ? PVC's (premature ventricular  contractions)   ? Thyroid nodule   ? small  ? Tubular adenoma of colon 2017  ? Urticaria   ? Uterine fibroid   ? ?Past Surgical History:  ?Procedure Laterality Date  ? CATARACT EXTRACTION Bilateral 2016  ? COLONOSCOPY  2007  ? echocardiogram (other)  01/16/2002  ? POLYPECTOMY    ? removed tumors from foot nerves  04/1999  ? stress cardiolite  02/12/2006  ? TOENAIL EXCISION    ? TUBAL LIGATION    ? TYMPANOSTOMY TUBE PLACEMENT    ? ? ?FAMILY HISTORY ?Family History  ?Problem Relation Age of Onset  ? Heart disease Mother   ? Allergic rhinitis Mother   ? Heart disease Father   ? Stomach cancer Maternal Grandmother   ? Heart disease Maternal Grandfather   ? Rectal cancer Maternal Grandfather   ? Colon cancer Neg Hx   ? Breast cancer Neg Hx   ? Colon polyps Neg Hx   ? Esophageal cancer Neg Hx   ? ? ?SOCIAL HISTORY ?Social History  ? ?Tobacco Use  ? Smoking status: Former  ?  Packs/day: 0.25  ?  Years: 5.00  ?  Pack years: 1.25  ?  Types: Cigarettes  ?  Quit date: 06/29/1968  ?  Years since quitting: 53.2  ? Smokeless tobacco: Never  ?Vaping Use  ? Vaping Use: Never used  ?Substance Use Topics  ? Alcohol use: No  ?  Alcohol/week: 0.0 standard drinks  ? Drug use: No  ? ?  ? ?  ? ?OPHTHALMIC EXAM: ? ?Base Eye Exam   ? ? Visual Acuity (ETDRS)   ? ?   Right Left  ? Dist [place] 20/20 -1 20/20  ? ?  ?  ? ? Tonometry (Tonopen, 8:56 AM)   ? ?   Right Left  ? Pressure 10 10  ? ?  ?  ? ? Pupils   ? ?   Pupils Dark Light Shape React APD  ? Right PERRL 4 4 Round Minimal None  ? Left PERRL 4 4 Round Minimal None  ? ?  ?  ? ? Visual Fields (Counting fingers)   ? ?   Left Right  ?  Full Full  ? ?  ?  ? ? Extraocular Movement   ? ?   Right Left  ?  Full Full  ? ?  ?  ? ? Neuro/Psych   ? ? Oriented x3: Yes  ? Mood/Affect: Normal  ? ?  ?  ? ? Dilation   ? ? Both eyes: 1.0% Mydriacyl, 2.5% Phenylephrine @ 8:56 AM  ? ?  ?  ? ?  ? ?Slit Lamp and Fundus Exam   ? ? External Exam   ? ?   Right Left  ? External Normal Normal  ? ?  ?  ? ?  Slit Lamp Exam    ? ?   Right Left  ? Lids/Lashes Normal Normal  ? Conjunctiva/Sclera White and quiet White and quiet  ? Cornea Clear Clear  ? Anterior Chamber Deep and quiet Deep and quiet  ? Iris Round and reactive Round and reactive  ? Lens Posterior chamber intraocular lens Posterior chamber intraocular lens  ? Anterior Vitreous Normal Normal  ? ?  ?  ? ? Fundus Exam   ? ?   Right Left  ? Posterior Vitreous Normal Normal  ? Disc Normal Normal  ? C/D Ratio 0.25 0.35  ? Macula Normal Normal  ? Vessels NPDR- Moderate NPDR- Moderate  ? Periphery Heavy pigmented region supero temporal to the macula OD, unchanged with good peripheral PRP in the temporal anterior periphery in regions of previous "white lines" of nonperfusion. Heavy pigmented region  temporal to the macula OD, unchanged with good peripheral PRP in the temporal anterior periphery in regions of previous "white lines" of nonperfusion.  ? ?  ?  ? ?  ? ? ?IMAGING AND PROCEDURES  ?Imaging and Procedures for 10/02/21 ? ?OCT, Retina - OU - Both Eyes   ? ?   ?Right Eye ?Quality was good. Scan locations included subfoveal. Central Foveal Thickness: 287. Progression has been stable. Findings include normal foveal contour.  ? ?Left Eye ?Quality was good. Scan locations included subfoveal. Central Foveal Thickness: 287. Progression has been stable. Findings include normal foveal contour.  ? ?Notes ?OU, no active maculopathy ? ?  ? ?Color Fundus Photography Optos - OU - Both Eyes   ? ?   ?Right Eye ?Progression has been stable. Disc findings include normal observations. Macula : normal observations.  ? ?Left Eye ?Progression has been stable.  ? ?Notes ?OD, moderate nonproliferative diabetic retinopathy stable.  Good PRP in the anterior temporal periphery and regions of retinal nonperfusion  ? ?OS, moderate nonproliferative diabetic retinopathy, stable, good PRP in the anterior temporal periphery periphery and regions of nonperfusion.  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 ?  ? ? ?  ?  ? ?  ?ASSESSMENT/PLAN: ? ?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 edema associated with type 2 diabetes mellitus ([place]) ?The nature of moderate

## 2021-10-07 ENCOUNTER — Ambulatory Visit (INDEPENDENT_AMBULATORY_CARE_PROVIDER_SITE_OTHER): Payer: Medicare Other

## 2021-10-07 VITALS — Ht 64.0 in | Wt 163.0 lb

## 2021-10-07 DIAGNOSIS — Z0000 Encounter for general adult medical examination without abnormal findings: Secondary | ICD-10-CM

## 2021-10-07 DIAGNOSIS — Z780 Asymptomatic menopausal state: Secondary | ICD-10-CM

## 2021-10-07 DIAGNOSIS — Z1231 Encounter for screening mammogram for malignant neoplasm of breast: Secondary | ICD-10-CM | POA: Diagnosis not present

## 2021-10-07 NOTE — Patient Instructions (Signed)
Amy Escobar , ?Thank you for taking time to complete your Medicare Wellness Visit. I appreciate your ongoing commitment to your health goals. Please review the following plan we discussed and let me know if I can assist you in the future.  ? ?Screening recommendations/referrals: ?Colonoscopy: No longer required ?Mammogram: Ordered today. Someone will call you to schedule. ?Bone Density: Ordered today. Someone will call you to schedule. ?Recommended yearly ophthalmology/optometry visit for glaucoma screening and checkup ?Recommended yearly dental visit for hygiene and checkup ? ?Vaccinations: ?Influenza vaccine: Up to date ?Pneumococcal vaccine: Up to date ?Tdap vaccine: Up to date ?Shingles vaccine: Due-May obtain vaccine at your local pharmacy. ?Covid-19:Up to date ? ?Advanced directives: May pick up forms at our office. ? ?Conditions/risks identified: See problem list ? ?Next appointment: Follow up in one year for your annual wellness visit  ? ? ?Preventive Care 32 Years and Older, Female ?Preventive care refers to lifestyle choices and visits with your health care provider that can promote health and wellness. ?What does preventive care include? ?A yearly physical exam. This is also called an annual well check. ?Dental exams once or twice a year. ?Routine eye exams. Ask your health care provider how often you should have your eyes checked. ?Personal lifestyle choices, including: ?Daily care of your teeth and gums. ?Regular physical activity. ?Eating a healthy diet. ?Avoiding tobacco and drug use. ?Limiting alcohol use. ?Practicing safe sex. ?Taking low-dose aspirin every day. ?Taking vitamin and mineral supplements as recommended by your health care provider. ?What happens during an annual well check? ?The services and screenings done by your health care provider during your annual well check will depend on your age, overall health, lifestyle risk factors, and family history of disease. ?Counseling  ?Your health  care provider may ask you questions about your: ?Alcohol use. ?Tobacco use. ?Drug use. ?Emotional well-being. ?Home and relationship well-being. ?Sexual activity. ?Eating habits. ?History of falls. ?Memory and ability to understand (cognition). ?Work and work Statistician. ?Reproductive health. ?Screening  ?You may have the following tests or measurements: ?Height, weight, and BMI. ?Blood pressure. ?Lipid and cholesterol levels. These may be checked every 5 years, or more frequently if you are over 66 years old. ?Skin check. ?Lung cancer screening. You may have this screening every year starting at age 60 if you have a 30-pack-year history of smoking and currently smoke or have quit within the past 15 years. ?Fecal occult blood test (FOBT) of the stool. You may have this test every year starting at age 50. ?Flexible sigmoidoscopy or colonoscopy. You may have a sigmoidoscopy every 5 years or a colonoscopy every 10 years starting at age 85. ?Hepatitis C blood test. ?Hepatitis B blood test. ?Sexually transmitted disease (STD) testing. ?Diabetes screening. This is done by checking your blood sugar (glucose) after you have not eaten for a while (fasting). You may have this done every 1-3 years. ?Bone density scan. This is done to screen for osteoporosis. You may have this done starting at age 54. ?Mammogram. This may be done every 1-2 years. Talk to your health care provider about how often you should have regular mammograms. ?Talk with your health care provider about your test results, treatment options, and if necessary, the need for more tests. ?Vaccines  ?Your health care provider may recommend certain vaccines, such as: ?Influenza vaccine. This is recommended every year. ?Tetanus, diphtheria, and acellular pertussis (Tdap, Td) vaccine. You may need a Td booster every 10 years. ?Zoster vaccine. You may need this after age 23. ?  Pneumococcal 13-valent conjugate (PCV13) vaccine. One dose is recommended after age  19. ?Pneumococcal polysaccharide (PPSV23) vaccine. One dose is recommended after age 2. ?Talk to your health care provider about which screenings and vaccines you need and how often you need them. ?This information is not intended to replace advice given to you by your health care provider. Make sure you discuss any questions you have with your health care provider. ?Document Released: 07/12/2015 Document Revised: 03/04/2016 Document Reviewed: 04/16/2015 ?Elsevier Interactive Patient Education ? 2017 [place]. ? ?Fall Prevention in the Home ?Falls can cause injuries. They can happen to people of all ages. There are many things you can do to make your home safe and to help prevent falls. ?What can I do on the outside of my home? ?Regularly fix the edges of walkways and driveways and fix any cracks. ?Remove anything that might make you trip as you walk through a door, such as a raised step or threshold. ?Trim any bushes or trees on the path to your home. ?Use bright outdoor lighting. ?Clear any walking paths of anything that might make someone trip, such as rocks or tools. ?Regularly check to see if handrails are loose or broken. Make sure that both sides of any steps have handrails. ?Any raised decks and porches should have guardrails on the edges. ?Have any leaves, snow, or ice cleared regularly. ?Use sand or salt on walking paths during winter. ?Clean up any spills in your garage right away. This includes oil or grease spills. ?What can I do in the bathroom? ?Use night lights. ?Install grab bars by the toilet and in the tub and shower. Do not use towel bars as grab bars. ?Use non-skid mats or decals in the tub or shower. ?If you need to sit down in the shower, use a plastic, non-slip stool. ?Keep the floor dry. Clean up any water that spills on the floor as soon as it happens. ?Remove soap buildup in the tub or shower regularly. ?Attach bath mats securely with double-sided non-slip rug tape. ?Do not have throw  rugs and other things on the floor that can make you trip. ?What can I do in the bedroom? ?Use night lights. ?Make sure that you have a light by your bed that is easy to reach. ?Do not use any sheets or blankets that are too big for your bed. They should not hang down onto the floor. ?Have a firm chair that has side arms. You can use this for support while you get dressed. ?Do not have throw rugs and other things on the floor that can make you trip. ?What can I do in the kitchen? ?Clean up any spills right away. ?Avoid walking on wet floors. ?Keep items that you use a lot in easy-to-reach places. ?If you need to reach something above you, use a strong step stool that has a grab bar. ?Keep electrical cords out of the way. ?Do not use floor polish or wax that makes floors slippery. If you must use wax, use non-skid floor wax. ?Do not have throw rugs and other things on the floor that can make you trip. ?What can I do with my stairs? ?Do not leave any items on the stairs. ?Make sure that there are handrails on both sides of the stairs and use them. Fix handrails that are broken or loose. Make sure that handrails are as long as the stairways. ?Check any carpeting to make sure that it is firmly attached to the stairs. Fix any  carpet that is loose or worn. ?Avoid having throw rugs at the top or bottom of the stairs. If you do have throw rugs, attach them to the floor with carpet tape. ?Make sure that you have a light switch at the top of the stairs and the bottom of the stairs. If you do not have them, ask someone to add them for you. ?What else can I do to help prevent falls? ?Wear shoes that: ?Do not have high heels. ?Have rubber bottoms. ?Are comfortable and fit you well. ?Are closed at the toe. Do not wear sandals. ?If you use a stepladder: ?Make sure that it is fully opened. Do not climb a closed stepladder. ?Make sure that both sides of the stepladder are locked into place. ?Ask someone to hold it for you, if  possible. ?Clearly mark and make sure that you can see: ?Any grab bars or handrails. ?First and last steps. ?Where the edge of each step is. ?Use tools that help you move around (mobility aids) if they are needed. T

## 2021-10-07 NOTE — Progress Notes (Signed)
? ?Subjective:  ? Amy Escobar is a 76 y.o. female who presents for Medicare Annual (Subsequent) preventive examination. ? ?I connected with Mariyana today by telephone and verified that I am speaking with the correct person using two identifiers. ?Location patient: home ?Location provider: work ?Persons participating in the virtual visit: patient, nurse.  ?  ?I discussed the limitations, risks, security and privacy concerns of performing an evaluation and management service by telephone and the availability of in person appointments. I also discussed with the patient that there may be a patient responsible charge related to this service. The patient expressed understanding and verbally consented to this telephonic visit.  ?  ?Interactive audio and video telecommunications were attempted between this provider and patient, however failed, due to patient having technical difficulties OR patient did not have access to video capability.  We continued and completed visit with audio only. ? ?Some vital signs may be absent or patient reported.  ? ?Time Spent with patient on telephone encounter: 25 minutes ? ? ?Review of Systems    ? ?Cardiac Risk Factors include: advanced age (>47mn, >>29women);diabetes mellitus;dyslipidemia;hypertension;sedentary lifestyle ? ?   ?Objective:  ?  ?Today's Vitals  ? 10/07/21 0901  ?Weight: 163 lb (73.9 kg)  ?Height: 5' 4" (1.626 m)  ? ?Body mass index is 27.98 kg/m?. ? ? ?  10/07/2021  ?  9:05 AM 07/17/2020  ? 12:17 PM 07/12/2019  ?  8:40 AM 04/01/2018  ?  9:03 AM 03/17/2018  ?  8:29 AM 08/12/2016  ?  8:08 AM 09/19/2015  ?  8:26 AM  ?Advanced Directives  ?Does Patient Have a Medical Advance Directive? No Yes Yes Yes Yes No Yes  ?Type of AScientist, physiologicalof [place]ky ValleyLiving will [place]ull HollowLiving will   HPort GibsonLiving will  ?Does patient want to make changes to medical advance directive?  No - Patient declined No - Patient declined       ?Copy of [place]?    Yes     ? ? ?Current Medications (verified) ?Outpatient Encounter Medications as of 10/07/2021  ?Medication Sig  ? aspirin EC 81 MG tablet Take 81 mg by mouth daily. Swallow whole.  ? benzonatate (TESSALON) 100 MG capsule TAKE ONE CAPSULE BY MOUTH THREE TIMES A DAY AS NEEDED FOR COUGH  ? Blood Glucose Monitoring Suppl (ONETOUCH VERIO FLEX SYSTEM) w/Device KIT USE TO TEST BLOOD SUGAR DAILY AS DIRECTED  ? calcium elemental as carbonate (ANTACID MAXIMUM) 400 MG chewable tablet Chew 1,000 mg by mouth as needed for heartburn.  ? carvedilol (COREG) 12.5 MG tablet Take 1 tablet (12.5 mg total) by mouth 2 (two) times daily with a meal.  ? cholecalciferol (VITAMIN D3) 25 MCG (1000 UNIT) tablet Take 1,000 Units by mouth daily.  ? Cyanocobalamin (B-12 PO) Take by mouth daily.  ? esomeprazole (NEXIUM) 40 MG capsule TAKE ONE CAPSULE BY MOUTH TWICE A DAY 30 TO 60 MINUTES BEFORE YOUR FIRST AND LAST MEALS OF THE DAY *NEED APPOINTMENT FOR FURTHER REFILLS*  ? famotidine (PEPCID) 20 MG tablet TAKE ONE TABLET BY MOUTH EVERY NIGHT AT BEDTIME  ? fesoterodine (TOVIAZ) 4 MG TB24 tablet Take 4 mg daily by mouth.  ? gabapentin (NEURONTIN) 300 MG capsule Take 1 capsule (300 mg total) by mouth at bedtime.  ? glucose blood (ONETOUCH VERIO) test strip USE TO CHECK BLOOD SUGAE TWO TIMES A DAY  ? guaiFENesin (MUCINEX) 600 MG 12 hr tablet  Take 600 mg by mouth 2 (two) times daily as needed.  ? metFORMIN (GLUCOPHAGE-XR) 500 MG 24 hr tablet TAKE TWO TABLETS BY MOUTH TWICE A DAY  ? montelukast (SINGULAIR) 10 MG tablet Take 1 tablet by mouth daily.  ? Olopatadine HCl 0.2 % SOLN Place 1 drop into both eyes daily.  ? Respiratory Therapy Supplies MISC Use flutter valve as needed to break the coughing cycle.  ? valsartan (DIOVAN) 160 MG tablet Take 1 tablet (160 mg total) by mouth daily.  ? rosuvastatin (CRESTOR) 20 MG tablet Take 1 tablet (20 mg total) by mouth daily.  ? ?No facility-administered encounter  medications on file as of 10/07/2021.  ? ? ?Allergies (verified) ?Olmesartan medoxomil  ? ?History: ?Past Medical History:  ?Diagnosis Date  ? Alkaline phosphatase deficiency   ? w/u Ne  ? Allergic rhinitis   ? Allergy   ? Anxiety   ? Arthritis   ? Cataract   ? BILATERAL-REMOVED  ? DM2 (diabetes mellitus, type 2) ([place])   ? GERD (gastroesophageal reflux disease)   ? Gout   ? Hemorrhoids   ? Hyperlipidemia   ? Hypertension   ? Mild pulmonary hypertension ([place])   ? NSVT (nonsustained ventricular tachycardia) ([place])   ? Osteoporosis   ? PVC's (premature ventricular contractions)   ? Thyroid nodule   ? small  ? Tubular adenoma of colon 2017  ? Urticaria   ? Uterine fibroid   ? ?Past Surgical History:  ?Procedure Laterality Date  ? CATARACT EXTRACTION Bilateral 2016  ? COLONOSCOPY  2007  ? echocardiogram (other)  01/16/2002  ? POLYPECTOMY    ? removed tumors from foot nerves  04/1999  ? stress cardiolite  02/12/2006  ? TOENAIL EXCISION    ? TUBAL LIGATION    ? TYMPANOSTOMY TUBE PLACEMENT    ? ?Family History  ?Problem Relation Age of Onset  ? Heart disease Mother   ? Allergic rhinitis Mother   ? Heart disease Father   ? Stomach cancer Maternal Grandmother   ? Heart disease Maternal Grandfather   ? Rectal cancer Maternal Grandfather   ? Colon cancer Neg Hx   ? Breast cancer Neg Hx   ? Colon polyps Neg Hx   ? Esophageal cancer Neg Hx   ? ?Social History  ? ?Socioeconomic History  ? Marital status: Widowed  ?  Spouse name: Not on file  ? Number of children: 2  ? Years of education: Not on file  ? Highest education level: Not on file  ?Occupational History  ? Occupation: OTC CLERK  ?  Employer: UNEMPLOYED  ?Tobacco Use  ? Smoking status: Former  ?  Packs/day: 0.25  ?  Years: 5.00  ?  Pack years: 1.25  ?  Types: Cigarettes  ?  Quit date: 06/29/1968  ?  Years since quitting: 53.3  ? Smokeless tobacco: Never  ?Vaping Use  ? Vaping Use: Never used  ?Substance and Sexual Activity  ? Alcohol use: No  ?  Alcohol/week: 0.0 standard  drinks  ? Drug use: No  ? Sexual activity: Not on file  ?Other Topics Concern  ? Not on file  ?Social History Narrative  ? Widowed 2010.   ? ?Social Determinants of Health  ? ?Financial Resource Strain: Low Risk   ? Difficulty of Paying Living Expenses: Not hard at all  ?Food Insecurity: No Food Insecurity  ? Worried About Charity fundraiser in the Last Year: Never true  ? Ran Out of Food  in the Last Year: Never true  ?Transportation Needs: No Transportation Needs  ? Lack of Transportation (Medical): No  ? Lack of Transportation (Non-Medical): No  ?Physical Activity: Inactive  ? Days of Exercise per Week: 0 days  ? Minutes of Exercise per Session: 0 min  ?Stress: No Stress Concern Present  ? Feeling of Stress : Not at all  ?Social Connections: Moderately Integrated  ? Frequency of Communication with Friends and Family: More than three times a week  ? Frequency of Social Gatherings with Friends and Family: More than three times a week  ? Attends Religious Services: More than 4 times per year  ? Active Member of Clubs or Organizations: Yes  ? Attends Archivist Meetings: More than 4 times per year  ? Marital Status: Widowed  ? ? ?Tobacco Counseling ?Counseling given: Not Answered ? ? ?Clinical Intake: ? ?Pre-visit preparation completed: Yes ? ?Pain : No/denies pain ? ?  ? ?BMI - recorded: 27.98 ?Nutritional Status: BMI 25 -29 Overweight ?Nutritional Risks: None ?Diabetes: Yes ?CBG done?: No ?Did pt. bring in CBG monitor from home?: No (phone visit) ? ?How often do you need to have someone help you when you read instructions, pamphlets, or other written materials from your doctor or pharmacy?: 1 - Never ? ?Diabetes: ? ?Is the patient diabetic?  Yes  ?If diabetic, was a CBG obtained today?  No  ?Did the patient bring in their glucometer from home?  No phone ?How often do you monitor your CBG's? daily.  ? ?Financial Strains and Diabetes Management: ? ?Are you having any financial strains with the device, your  supplies or your medication? No .  ?Does the patient want to be seen by Chronic Care Management for management of their diabetes?  No  ?Would the patient like to be referred to a Nutritionist or for Diabetic

## 2021-10-09 ENCOUNTER — Ambulatory Visit: Payer: Medicare Other

## 2021-10-23 ENCOUNTER — Encounter: Payer: Self-pay | Admitting: Internal Medicine

## 2021-10-23 ENCOUNTER — Telehealth: Payer: Self-pay | Admitting: Cardiology

## 2021-10-23 ENCOUNTER — Ambulatory Visit (INDEPENDENT_AMBULATORY_CARE_PROVIDER_SITE_OTHER): Payer: Medicare Other | Admitting: Internal Medicine

## 2021-10-23 VITALS — BP 122/78 | HR 94 | Ht 64.0 in | Wt 163.4 lb

## 2021-10-23 DIAGNOSIS — E118 Type 2 diabetes mellitus with unspecified complications: Secondary | ICD-10-CM

## 2021-10-23 DIAGNOSIS — E113393 Type 2 diabetes mellitus with moderate nonproliferative diabetic retinopathy without macular edema, bilateral: Secondary | ICD-10-CM | POA: Diagnosis not present

## 2021-10-23 DIAGNOSIS — R079 Chest pain, unspecified: Secondary | ICD-10-CM | POA: Diagnosis not present

## 2021-10-23 DIAGNOSIS — E1142 Type 2 diabetes mellitus with diabetic polyneuropathy: Secondary | ICD-10-CM | POA: Diagnosis not present

## 2021-10-23 NOTE — Progress Notes (Signed)
?Name: Amy Escobar  ?Age/ Sex: 76 y.o., female   ?MRN/ DOB: [id number], 1946/04/01    ? ?PCP: [name], [place]   ?Reason for Endocrinology Evaluation: Type 2 Diabetes Mellitus  ?Initial Endocrine Consultative Visit: 11/25/2012  ? ? ?PATIENT IDENTIFIER: [name] is a 76 y.o. female with a past medical history of T2DM, HTN, non-obstructive CAD , and Dyslipidemia . The patient has followed with Endocrinology clinic since 11/25/2012 for consultative assistance with management of her diabetes. ? ?DIABETIC HISTORY:  ?Amy Escobar was diagnosed with DM years ago, She has been on Metformin . Her hemoglobin A1c has ranged from 5.6% in 2021, peaking at 7.2% in 2015. ? ?Invokana caused genital infections  ? ?Transitioned care from Dr. Loanne Drilling 05/2021 ? ? ?SUBJECTIVE:  ? ?During the last visit (06/18/2021): A1c 5.8% remained off metformin  ? ? ? ?Today (10/23/2021): Amy Escobar  is here for a follow up on diabetes management. She checks her blood sugars 1 times daily. The patient has not had hypoglycemic episodes since the last clinic visit.  ? ? ?Saw podiatry 08/05/2021 ?We had stopped Metformin but today she tells me she takes it once a week  ? ?She denies nausea, vomiting or diarrhea but has occasional constipation  ? ?She sees urology for bladder incontinence ? ?She wok up last night which chest tightness and had to take a deep breath, has been having occasional SOB , she does endorse GERD symptoms, she also lifted something heavy a few days prior and thinks it may be muscular, she was seen by cardiology 08/2021 ? ? ? ?HOME DIABETES REGIMEN:  ?N/A ? ? ? ? ?Statin: yes ?ACE-I/ARB: yes ? ? ? ?METER DOWNLOAD SUMMARY: ?97-168 mg/dL  ? ? 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 ?Macrovascular complications:  ?Non-Obstructive CAD - follows with Cardiology  ?Denies: CVA, PVD ? ? ?HISTORY:  ?Past  Medical History:  ?Past Medical History:  ?Diagnosis Date  ? Alkaline phosphatase deficiency   ? w/u Ne  ? Allergic rhinitis   ? Allergy   ? Anxiety   ? Arthritis   ? Cataract   ? BILATERAL-REMOVED  ? DM2 (diabetes mellitus, type 2) ([place])   ? GERD (gastroesophageal reflux disease)   ? Gout   ? Hemorrhoids   ? Hyperlipidemia   ? Hypertension   ? Mild pulmonary hypertension ([place])   ? NSVT (nonsustained ventricular tachycardia) ([place])   ? Osteoporosis   ? PVC's (premature ventricular contractions)   ? Thyroid nodule   ? small  ? Tubular adenoma of colon 2017  ? Urticaria   ? Uterine fibroid   ? ?Past Surgical History:  ?Past Surgical History:  ?Procedure Laterality Date  ? CATARACT EXTRACTION Bilateral 2016  ? COLONOSCOPY  2007  ? echocardiogram (other)  01/16/2002  ? POLYPECTOMY    ? removed tumors from foot nerves  04/1999  ? stress cardiolite  02/12/2006  ? TOENAIL EXCISION    ? TUBAL LIGATION    ? TYMPANOSTOMY TUBE PLACEMENT    ? ?Social History:  reports that she quit smoking about 53 years ago. Her smoking use included cigarettes. She has a 1.25 pack-year smoking history. She has never used smokeless tobacco. She reports that she does not drink alcohol and does not use drugs. ?Family History:  ?Family History  ?Problem Relation Age of Onset  ? Heart disease Mother   ? Allergic rhinitis Mother   ?  Heart disease Father   ? Stomach cancer Maternal Grandmother   ? Heart disease Maternal Grandfather   ? Rectal cancer Maternal Grandfather   ? Colon cancer Neg Hx   ? Breast cancer Neg Hx   ? Colon polyps Neg Hx   ? Esophageal cancer Neg Hx   ? ? ? ?HOME MEDICATIONS: ?Allergies as of 10/23/2021   ? ?   Reactions  ? Olmesartan Medoxomil   ? REACTION: headache  ? ?  ? ?  ?Medication List  ?  ? ?  ? Accurate as of October 23, 2021  7:46 AM. If you have any questions, ask your nurse or doctor.  ?  ?  ? ?  ? ?Antacid Maximum 400 MG chewable tablet ?Generic drug: calcium elemental as carbonate ?Chew 1,000 mg by mouth as needed for  heartburn. ?  ?aspirin EC 81 MG tablet ?Take 81 mg by mouth daily. Swallow whole. ?  ?B-12 PO ?Take by mouth daily. ?  ?benzonatate 100 MG capsule ?Commonly known as: TESSALON ?TAKE ONE CAPSULE BY MOUTH THREE TIMES A DAY AS NEEDED FOR COUGH ?  ?carvedilol 12.5 MG tablet ?Commonly known as: COREG ?Take 1 tablet (12.5 mg total) by mouth 2 (two) times daily with a meal. ?  ?cholecalciferol 25 MCG (1000 UNIT) tablet ?Commonly known as: VITAMIN D3 ?Take 1,000 Units by mouth daily. ?  ?esomeprazole 40 MG capsule ?Commonly known as: [place] ?TAKE ONE CAPSULE BY MOUTH TWICE A DAY 30 TO 60 MINUTES BEFORE YOUR FIRST AND LAST MEALS OF THE DAY *NEED APPOINTMENT FOR FURTHER REFILLS* ?  ?famotidine 20 MG tablet ?Commonly known as: PEPCID ?TAKE ONE TABLET BY MOUTH EVERY NIGHT AT BEDTIME ?  ?fesoterodine 4 MG Tb24 tablet ?Commonly known as: TOVIAZ ?Take 4 mg daily by mouth. ?  ?gabapentin 300 MG capsule ?Commonly known as: NEURONTIN ?Take 1 capsule (300 mg total) by mouth at bedtime. ?  ?guaiFENesin 600 MG 12 hr tablet ?Commonly known as: [place] ?Take 600 mg by mouth 2 (two) times daily as needed. ?  ?metFORMIN 500 MG 24 hr tablet ?Commonly known as: GLUCOPHAGE-XR ?TAKE TWO TABLETS BY MOUTH TWICE A DAY ?  ?montelukast 10 MG tablet ?Commonly known as: SINGULAIR ?Take 1 tablet by mouth daily. ?  ?Olopatadine HCl 0.2 % Soln ?Place 1 drop into both eyes daily. ?  ?Marketing executive w/Device Kit ?USE TO TEST BLOOD SUGAR DAILY AS DIRECTED ?  ?OneTouch Verio test strip ?Generic drug: glucose blood ?USE TO CHECK BLOOD SUGAE TWO TIMES A DAY ?  ?Respiratory Therapy Supplies Misc ?Use flutter valve as needed to break the coughing cycle. ?  ?rosuvastatin 20 MG tablet ?Commonly known as: CRESTOR ?Take 1 tablet (20 mg total) by mouth daily. ?  ?valsartan 160 MG tablet ?Commonly known as: DIOVAN ?Take 1 tablet (160 mg total) by mouth daily. ?  ? ?  ? ? ? ?OBJECTIVE:  ? ?Vital Signs: BP 122/78 (BP Location: Left Arm, Patient Position:  Sitting, Cuff Size: Small)   Pulse 94   Ht '5\' 4"'  (1.626 m)   Wt 163 lb 6.4 oz (74.1 kg)   SpO2 99%   BMI 28.05 kg/m?   ?Wt Readings from Last 3 Encounters:  ?10/23/21 163 lb 6.4 oz (74.1 kg)  ?10/07/21 163 lb (73.9 kg)  ?09/30/21 163 lb (73.9 kg)  ? ? ? ?Exam: ?General: Pt appears well and is in NAD  ?Neck: General: Supple without adenopathy. ?Thyroid: Thyroid size normal.  No goiter or nodules  appreciated.  ?Lungs: Clear with good BS bilat with no rales, rhonchi, or wheezes  ?Heart: RRR with normal S1 and S2 and no gallops; no murmurs; no rub  ?Abdomen: Normoactive bowel sounds, soft, nontender, without masses or organomegaly palpable  ?Extremities: No pretibial edema.   ?Neuro: MS is good with appropriate affect, pt is alert and Ox3  ? ? ?DM foot exam: 06/18/2021 ?  ?The skin of the feet is without sores or ulcerations. ?The pedal pulses are 2+ on right and 2+ on left. ?The sensation is decreased  to a screening 5.07, 10 gram monofilament on the left and absent at the left great toe  ? ?  ?   ? ? ? ?DATA REVIEWED: ? ?Lab Results  ?Component Value Date  ? HGBA1C 5.9 09/16/2021  ? HGBA1C 5.8 (A) 06/18/2021  ? HGBA1C 5.7 (A) 12/26/2020  ? ?Lab Results  ?Component Value Date  ? MICROALBUR 5.5 (H) 06/18/2021  ? [place] 71 09/16/2021  ? CREATININE 0.76 09/16/2021  ? ? Latest Reference Range & Units 09/16/21 13:27  ?Sodium 135 - 145 mEq/L 139  ?Potassium 3.5 - 5.1 mEq/L 3.9  ?Chloride 96 - 112 mEq/L 101  ?CO2 19 - 32 mEq/L 30  ?Glucose 70 - 99 mg/dL 100 (H)  ?BUN 6 - 23 mg/dL 12  ?Creatinine 0.40 - 1.20 mg/dL 0.76  ?Calcium 8.4 - 10.5 mg/dL 9.0  ?Alkaline Phosphatase 39 - 117 U/L 80  ?Albumin 3.5 - 5.2 g/dL 3.8  ?AST 0 - 37 U/L 23  ?ALT 0 - 35 U/L 22  ?Total Protein 6.0 - 8.3 g/dL 6.9  ?Total Bilirubin 0.2 - 1.2 mg/dL 0.3  ?GFR >60.00 mL/min 76.26  ? ? Latest Reference Range & Units 09/16/21 13:27  ?Total CHOL/HDL Ratio  3  ?Cholesterol 0 - 200 mg/dL 132  ?HDL Cholesterol >39.00 mg/dL 41.20  ?LDL (calc) 0 - 99 mg/dL  71  ?NonHDL  91.04  ?Triglycerides 0.0 - 149.0 mg/dL 99.0  ?VLDL 0.0 - 40.0 mg/dL 19.8  ? ? ? ?ASSESSMENT / PLAN / RECOMMENDATIONS:  ? ?1) Type 2 Diabetes Mellitus, Optimally controlled, With Retinopathic,

## 2021-10-23 NOTE — Telephone Encounter (Signed)
Spoke with pt who states she was awakened with chest pain last night.  Pt reports it was mid chest without radiation, nausea or diaphoresis.  She denies current CP, SOB or dizziness.  She states she sat on the side of the bed, went to the bathroom, drank some water and pain subsided.  Pt reports she also has GERD and had eaten a large meal for dinner consisting of meat loaf, rice, gravy, brussels sprouts  and peppers.  Pt states she feels "great" this morning and had an appointment with her endocrinologist who advised her to let cardiology know about the episode of chest pain.  Pt states she is taking medications as prescribed.  B/P this morning was 122/78 with HR of 94.   ?Pt advised to continue to take medications as prescribed and monitor daily BP.  If she continues to have episodic CP to contact office.  Reviewed ED precautions.  Pt verbalizes understanding and agrees with current plan. ?

## 2021-10-23 NOTE — Telephone Encounter (Signed)
Pt c/o of Chest Pain: STAT if CP now or developed within 24 hours ? ?1. Are you having CP right now? No  ? ?2. Are you experiencing any other symptoms (ex. SOB, nausea, vomiting, sweating)? No  ? ?3. How long have you been experiencing CP? Chest pain woke her up last night. She does have acid reflux.  ? ?4. Is your CP continuous or coming and going? Its just came last night she drank some water and the pain left hasn't come back.  ? ?5. Have you taken Nitroglycerin? No.  ??  ?

## 2021-10-28 ENCOUNTER — Ambulatory Visit: Payer: Medicare Other | Admitting: Family

## 2021-10-28 ENCOUNTER — Ambulatory Visit
Admission: RE | Admit: 2021-10-28 | Discharge: 2021-10-28 | Disposition: A | Discharge status: Home / Self Care | Payer: Medicare Other | Source: Ambulatory Visit | Attending: Family | Admitting: Family

## 2021-10-28 DIAGNOSIS — Z1231 Encounter for screening mammogram for malignant neoplasm of breast: Secondary | ICD-10-CM | POA: Diagnosis not present

## 2021-11-03 ENCOUNTER — Ambulatory Visit (INDEPENDENT_AMBULATORY_CARE_PROVIDER_SITE_OTHER): Payer: Medicare Other | Admitting: Sports Medicine

## 2021-11-03 ENCOUNTER — Ambulatory Visit (INDEPENDENT_AMBULATORY_CARE_PROVIDER_SITE_OTHER): Payer: Medicare Other

## 2021-11-03 VITALS — BP 132/80 | HR 107 | Ht 64.0 in | Wt 163.0 lb

## 2021-11-03 DIAGNOSIS — M25522 Pain in left elbow: Secondary | ICD-10-CM

## 2021-11-03 DIAGNOSIS — M542 Cervicalgia: Secondary | ICD-10-CM

## 2021-11-03 DIAGNOSIS — M25529 Pain in unspecified elbow: Secondary | ICD-10-CM | POA: Diagnosis not present

## 2021-11-03 DIAGNOSIS — M545 Low back pain, unspecified: Secondary | ICD-10-CM

## 2021-11-03 DIAGNOSIS — M25512 Pain in left shoulder: Secondary | ICD-10-CM

## 2021-11-03 DIAGNOSIS — M25562 Pain in left knee: Secondary | ICD-10-CM | POA: Diagnosis not present

## 2021-11-03 NOTE — Progress Notes (Signed)
? ? Benito Mccreedy D.Merril Abbe ?[facility] ?[place] ?Phone: [phone] ?  ?Assessment and Plan:   ?  ?1. Left shoulder pain, unspecified chronicity ?2. Neck pain ?3. Elbow pain, unspecified laterality ?4. Low back pain, unspecified back pain laterality, unspecified chronicity, unspecified whether sciatica present ?5. Left knee pain, unspecified chronicity ?-Chronic with exacerbation, subsequent visit ?- Acute flares of multiple chronic pains after fall yesterday.  No fracture seen on x-rays.  Will await radiology read ?- Start Tylenol 1000 mg tablets 3 times a day for day-to-day pain relief ?- X-rays obtained in clinic.  My interpretation: No acute fractures, dislocations, vertebral collapsing.  Multiple areas of chronic appearing change including DDD of cervical and lumbar regions, tricompartmental left knee OA, prominent medial epicondyle ?- DG Knee AP/LAT W/[place] Left; Future  ?  ?Pertinent previous records reviewed include none ?  ?Follow Up: 1 week for reevaluation to see if multiple musculoskeletal complaints have improved.  Could consider repeat knee CSI ?  ?Subjective:   ?I, Pincus Badder, am serving as a Education administrator for [employer] ? ?Chief Complaint: left side pain  ? ?HPI:  ? ?11/03/21 ?Patient is a 76 year old female complaining of  left side pain. Patient states that she fell yesterday she walking down the hall left shoulder , elbow , hand and some rib pain down her hip , knee, leg and low back had to take 2 tylenol was still able to ADLs but today has been in the bed all day long do to the pain.is getting numbness and tingling left elbow  ? ?Relevant Historical Information: Hypertension, GERD, DM type II, osteoporosis, osteoarthritis of multiple joints ? ?Additional pertinent review of systems negative. ? ? ?Current Outpatient Medications:  ?  aspirin EC 81 MG tablet, Take 81 mg by mouth daily. Swallow whole., Disp: , Rfl:  ?   benzonatate (TESSALON) 100 MG capsule, TAKE ONE CAPSULE BY MOUTH THREE TIMES A DAY AS NEEDED FOR COUGH, Disp: 45 capsule, Rfl: 0 ?  Blood Glucose Monitoring Suppl (ONETOUCH VERIO FLEX SYSTEM) w/Device KIT, USE TO TEST BLOOD SUGAR DAILY AS DIRECTED, Disp: 1 kit, Rfl: 0 ?  calcium elemental as carbonate (ANTACID MAXIMUM) 400 MG chewable tablet, Chew 1,000 mg by mouth as needed for heartburn., Disp: , Rfl:  ?  carvedilol (COREG) 12.5 MG tablet, Take 1 tablet (12.5 mg total) by mouth 2 (two) times daily with a meal., Disp: 180 tablet, Rfl: 3 ?  cholecalciferol (VITAMIN D3) 25 MCG (1000 UNIT) tablet, Take 1,000 Units by mouth daily., Disp: , Rfl:  ?  Cyanocobalamin (B-12 PO), Take by mouth daily., Disp: , Rfl:  ?  esomeprazole (NEXIUM) 40 MG capsule, TAKE ONE CAPSULE BY MOUTH TWICE A DAY 30 TO 60 MINUTES BEFORE YOUR FIRST AND LAST MEALS OF THE DAY *NEED APPOINTMENT FOR FURTHER REFILLS*, Disp: 180 capsule, Rfl: 0 ?  famotidine (PEPCID) 20 MG tablet, TAKE ONE TABLET BY MOUTH EVERY NIGHT AT BEDTIME, Disp: 90 tablet, Rfl: 0 ?  fesoterodine (TOVIAZ) 4 MG TB24 tablet, Take 4 mg daily by mouth., Disp: , Rfl:  ?  gabapentin (NEURONTIN) 300 MG capsule, Take 1 capsule (300 mg total) by mouth at bedtime., Disp: 90 capsule, Rfl: 3 ?  glucose blood (ONETOUCH VERIO) test strip, USE TO CHECK BLOOD SUGAE TWO TIMES A DAY, Disp: 100 strip, Rfl: 3 ?  guaiFENesin (MUCINEX) 600 MG 12 hr tablet, Take 600 mg by mouth 2 (two) times daily as needed.,  Disp: , Rfl:  ?  metFORMIN (GLUCOPHAGE-XR) 500 MG 24 hr tablet, TAKE TWO TABLETS BY MOUTH TWICE A DAY, Disp: 360 tablet, Rfl: 2 ?  montelukast (SINGULAIR) 10 MG tablet, Take 1 tablet by mouth daily., Disp: , Rfl:  ?  Olopatadine HCl 0.2 % SOLN, Place 1 drop into both eyes daily., Disp: 1.5 mL, Rfl: 11 ?  Respiratory Therapy Supplies MISC, Use flutter valve as needed to break the coughing cycle., Disp: 1 each, Rfl: 1 ?  valsartan (DIOVAN) 160 MG tablet, Take 1 tablet (160 mg total) by mouth daily.,  Disp: 90 tablet, Rfl: 2 ?  rosuvastatin (CRESTOR) 20 MG tablet, Take 1 tablet (20 mg total) by mouth daily., Disp: 90 tablet, Rfl: 3  ? ?Objective:   ?  ?Vitals:  ? 11/03/21 1442  ?BP: 132/80  ?Pulse: (!) 107  ?SpO2: 96%  ?Weight: 163 lb (73.9 kg)  ?Height: '5\' 4"'  (1.626 m)  ?  ?  ?Body mass index is 27.98 kg/m?.  ?  ?Physical Exam:   ? ?Cervical Spine: Posture normal ?Skin: normal, intact ? ?Neurological:  ? ?Strength: ? Right  Left   ?Deltoid 5/5 5/5  ?Bicep 5/5  5/5  ?Tricep 5/5 5/5  ?Wrist Flexion 5/5 5/5  ?Wrist Extension 5/5 5/5  ?Grip 5/5 5/5  ?Finger Abduction 5/5 5/5  ? ?Sensation: intact to light touch in upper extremities bilaterally ? ?Spurling's:  negative bilaterally ?Neck ROM: Full active ROM ?TTP: cervical paraspinal, thoracic paraspinal, trapezius ?NTTP: cervical spinous processes,  ? ?Gen: Appears well, nad, nontoxic and pleasant ?Neuro:sensation intact, strength is 5/5 with df/pf/inv/ev, muscle tone wnl ?Skin: no suspicious lesion or defmority ?Psych: A&O, appropriate mood and affect ? ?Left shoulder: no deformity, swelling or muscle wasting ?No scapular winging ?FF 160, abd 160, int 20, ext 70 ?TTP deltoid, biceps groove, trapezius ?NTTP over the [place], clavicle, ac, coracoid,   cervical spine ?  ?Left ELBOW: no deformity, swelling or muscle wasting ?Normal Carrying angle ?ROM:0-140, supination and pronation 90 ?NTTP over triceps, ticeps tendon, olecronon, lat epicondyle, medial epicondyle, antecubital fossa, biceps tendon, supinator, pronator ? ?Back - Normal skin, Spine with normal alignment and no deformity.   ?No tenderness to vertebral process palpation.   ?Paraspinous muscles are mildly tender and without spasm ?Straight leg raise negative ? ? ?Left knee: ?No swelling ?No deformity ?Neg fluid wave, joint milking ?ROM Flex 100, Ext 5 ?NTTP over the quad tendon, medial fem condyle, lat fem condyle, patella, plica, patella tendon, tibial tuberostiy, fibular head, posterior fossa, pes anserine  bursa, gerdy's tubercle, medial jt line, lateral jt line ?  ? ?Electronically signed by:  ?Benito Mccreedy D.Merril Abbe ?[facility] ?3:31 PM 11/03/21 ?

## 2021-11-03 NOTE — Patient Instructions (Addendum)
Good to see you  ?Out of work for 1 week ?Tylenol 1000 mg 3 times a day for pain relief  ?1 week follow up  ? ?

## 2021-11-05 ENCOUNTER — Telehealth: Payer: Self-pay | Admitting: *Deleted

## 2021-11-05 NOTE — Telephone Encounter (Signed)
Pt was made an appointment for 11/06/2021 ?

## 2021-11-05 NOTE — Telephone Encounter (Signed)
Pt called, discussed xray results. Pt stated she's very concerned about her breast tenderness/pain that she's having & would like a call back to discuss further.  ?

## 2021-11-06 ENCOUNTER — Ambulatory Visit (INDEPENDENT_AMBULATORY_CARE_PROVIDER_SITE_OTHER): Payer: Medicare Other | Admitting: Sports Medicine

## 2021-11-06 ENCOUNTER — Ambulatory Visit (INDEPENDENT_AMBULATORY_CARE_PROVIDER_SITE_OTHER): Payer: Medicare Other

## 2021-11-06 VITALS — BP 120/80 | HR 87 | Ht 64.0 in | Wt 163.0 lb

## 2021-11-06 DIAGNOSIS — S63502A Unspecified sprain of left wrist, initial encounter: Secondary | ICD-10-CM

## 2021-11-06 DIAGNOSIS — R911 Solitary pulmonary nodule: Secondary | ICD-10-CM | POA: Diagnosis not present

## 2021-11-06 DIAGNOSIS — M25532 Pain in left wrist: Secondary | ICD-10-CM | POA: Diagnosis not present

## 2021-11-06 DIAGNOSIS — Z9181 History of falling: Secondary | ICD-10-CM | POA: Diagnosis not present

## 2021-11-06 DIAGNOSIS — W19XXXD Unspecified fall, subsequent encounter: Secondary | ICD-10-CM | POA: Diagnosis not present

## 2021-11-06 DIAGNOSIS — N644 Mastodynia: Secondary | ICD-10-CM | POA: Diagnosis not present

## 2021-11-06 DIAGNOSIS — R0781 Pleurodynia: Secondary | ICD-10-CM

## 2021-11-06 MED ORDER — AMBULATORY NON FORMULARY MEDICATION: 0 refills | Status: DC

## 2021-11-06 NOTE — Progress Notes (Addendum)
? ? Benito Mccreedy D.Merril Abbe ?[facility] ?[place] ?Phone: [phone] ?  ?Assessment and Plan:   ?  ?1. Rib pain on left side ?2. Fall, subsequent encounter ?3. Left wrist pain ?4. Sprain of left wrist, initial encounter ?5. At high risk for injury related to fall ?-Acute, subsequent visit ?- Patient returned to clinic because of left-sided rib cage pain after her fall that became more prominent after our office visit on 11/03/2021 as well as mild continued left wrist pain ?- X-ray obtained in clinic.  My interpretation: No definitive rib fracture.  No evidence of pneumothorax. ?- No evidence of rib fracture on physical exam or x-ray, no significant tenderness with palpation of wrist ?- Advised to use rolling walker for ambulation until musculoskeletal complaints improve to prevent further fall.  DME order provided to patient ?- Advised to use left wrist brace when active, and take brace off when at home to prevent stiffness.  DME order provided to patient ?  ?Pertinent previous records reviewed include none ?  ?Follow Up: As needed if no improvement or worsening of symptoms ?  ?Subjective:   ?I, Pincus Badder, am serving as a Education administrator for [employer] ?  ?Chief Complaint: left side pain  ?  ?HPI:  ?  ?11/03/21 ?Patient is a 76 year old female complaining of  left side pain. Patient states that she fell yesterday she walking down the hall left shoulder , elbow , hand and some rib pain down her hip , knee, leg and low back had to take 2 tylenol was still able to ADLs but today has been in the bed all day long do to the pain.is getting numbness and tingling left elbow  ? ?11/06/2021 ?Patient states has some pain under her left breast isnt able to sleep on her left side ?  ?Relevant Historical Information: Hypertension, GERD, DM type II, osteoporosis, osteoarthritis of multiple joints ? ?Additional pertinent review of systems negative. ? ? ?Current  Outpatient Medications:  ?  aspirin EC 81 MG tablet, Take 81 mg by mouth daily. Swallow whole., Disp: , Rfl:  ?  benzonatate (TESSALON) 100 MG capsule, TAKE ONE CAPSULE BY MOUTH THREE TIMES A DAY AS NEEDED FOR COUGH, Disp: 45 capsule, Rfl: 0 ?  Blood Glucose Monitoring Suppl (ONETOUCH VERIO FLEX SYSTEM) w/Device KIT, USE TO TEST BLOOD SUGAR DAILY AS DIRECTED, Disp: 1 kit, Rfl: 0 ?  calcium elemental as carbonate (ANTACID MAXIMUM) 400 MG chewable tablet, Chew 1,000 mg by mouth as needed for heartburn., Disp: , Rfl:  ?  carvedilol (COREG) 12.5 MG tablet, Take 1 tablet (12.5 mg total) by mouth 2 (two) times daily with a meal., Disp: 180 tablet, Rfl: 3 ?  cholecalciferol (VITAMIN D3) 25 MCG (1000 UNIT) tablet, Take 1,000 Units by mouth daily., Disp: , Rfl:  ?  Cyanocobalamin (B-12 PO), Take by mouth daily., Disp: , Rfl:  ?  esomeprazole (NEXIUM) 40 MG capsule, TAKE ONE CAPSULE BY MOUTH TWICE A DAY 30 TO 60 MINUTES BEFORE YOUR FIRST AND LAST MEALS OF THE DAY *NEED APPOINTMENT FOR FURTHER REFILLS*, Disp: 180 capsule, Rfl: 0 ?  famotidine (PEPCID) 20 MG tablet, TAKE ONE TABLET BY MOUTH EVERY NIGHT AT BEDTIME, Disp: 90 tablet, Rfl: 0 ?  fesoterodine (TOVIAZ) 4 MG TB24 tablet, Take 4 mg daily by mouth., Disp: , Rfl:  ?  gabapentin (NEURONTIN) 300 MG capsule, Take 1 capsule (300 mg total) by mouth at bedtime., Disp: 90 capsule,  Rfl: 3 ?  glucose blood (ONETOUCH VERIO) test strip, USE TO CHECK BLOOD SUGAE TWO TIMES A DAY, Disp: 100 strip, Rfl: 3 ?  guaiFENesin (MUCINEX) 600 MG 12 hr tablet, Take 600 mg by mouth 2 (two) times daily as needed., Disp: , Rfl:  ?  metFORMIN (GLUCOPHAGE-XR) 500 MG 24 hr tablet, TAKE TWO TABLETS BY MOUTH TWICE A DAY, Disp: 360 tablet, Rfl: 2 ?  montelukast (SINGULAIR) 10 MG tablet, Take 1 tablet by mouth daily., Disp: , Rfl:  ?  Olopatadine HCl 0.2 % SOLN, Place 1 drop into both eyes daily., Disp: 1.5 mL, Rfl: 11 ?  Respiratory Therapy Supplies MISC, Use flutter valve as needed to break the coughing  cycle., Disp: 1 each, Rfl: 1 ?  rosuvastatin (CRESTOR) 20 MG tablet, Take 1 tablet (20 mg total) by mouth daily., Disp: 90 tablet, Rfl: 3 ?  valsartan (DIOVAN) 160 MG tablet, Take 1 tablet (160 mg total) by mouth daily., Disp: 90 tablet, Rfl: 2  ? ?Objective:   ?  ?Vitals:  ? 11/06/21 0907  ?BP: 120/80  ?Pulse: 87  ?SpO2: 96%  ?Weight: 163 lb (73.9 kg)  ?Height: _0  (1.626 m)  ?  ?  ?Body mass index is 27.98 kg/m?.  ?  ?Physical Exam:   ? ?General: Appears well, nad, nontoxic and pleasant ?Neuro:sensation intact, strength is 5/5 with df/pf/inv/ev, muscle tone wnl ?Skin:no susupicious lesions or rashes ?Chest wall: TTP anterior ribs 5-7 without step-off.  No flail chest, deformity, bruising.  No pain with deep inhalation. ? ?Left wrist:  No deformity or swelling appreciated. ?ROM  Ext 90, flexion70, radial/ulnar deviation 30 ?nttp over the snauff box, dorsal carpals, volar carpals, radial styloid, ulnar styloid, 1st mcp, tfcc ?  ? ? ?Electronically signed by:  ?Benito Mccreedy D.Merril Abbe ?[facility] ?9:26 AM 11/06/21 ?

## 2021-11-06 NOTE — Patient Instructions (Addendum)
Good to see you  ?2 DME rolling walker left wrist brace  ?As needed follow up  ?

## 2021-11-07 ENCOUNTER — Other Ambulatory Visit: Payer: Self-pay | Admitting: Sports Medicine

## 2021-11-07 DIAGNOSIS — R911 Solitary pulmonary nodule: Secondary | ICD-10-CM

## 2021-11-07 NOTE — Addendum Note (Signed)
Addended by: Judy Pimple R on: 11/07/2021 01:15 PM ? ? Modules accepted: Orders ? ?

## 2021-11-07 NOTE — Progress Notes (Signed)
Action items completed: CT chest with contrast was ordered due to solitary lung nodule seen on chest x-ray. ? ?I called and spoke with patient.  I informed her that we had updated the diagnosis codes and refaxed over order forms for her relating walker and her wrist brace.  I also informed patient that a solitary lung nodule was seen on chest x-ray with recommendation of further evaluation with CT scan.  Patient verbalized understanding and had no further questions.  Once CT has reported, I will contact patient with results and further treatment plan. ?

## 2021-11-10 ENCOUNTER — Ambulatory Visit
Admission: RE | Admit: 2021-11-10 | Discharge: 2021-11-10 | Disposition: A | Discharge status: Home / Self Care | Payer: Medicare Other | Source: Ambulatory Visit | Attending: Sports Medicine | Admitting: Sports Medicine

## 2021-11-10 DIAGNOSIS — N1330 Unspecified hydronephrosis: Secondary | ICD-10-CM | POA: Diagnosis not present

## 2021-11-10 DIAGNOSIS — R911 Solitary pulmonary nodule: Secondary | ICD-10-CM | POA: Diagnosis not present

## 2021-11-10 DIAGNOSIS — M25532 Pain in left wrist: Secondary | ICD-10-CM | POA: Diagnosis not present

## 2021-11-10 DIAGNOSIS — K760 Fatty (change of) liver, not elsewhere classified: Secondary | ICD-10-CM | POA: Diagnosis not present

## 2021-11-10 DIAGNOSIS — I700 Atherosclerosis of aorta: Secondary | ICD-10-CM | POA: Diagnosis not present

## 2021-11-10 DIAGNOSIS — Z9181 History of falling: Secondary | ICD-10-CM | POA: Diagnosis not present

## 2021-11-10 MED ORDER — IOPAMIDOL (ISOVUE-300) INJECTION 61%
75.0000 mL | Freq: Once | INTRAVENOUS | Status: AC | PRN
  Administered 2021-11-10: 75 mL via INTRAVENOUS

## 2021-11-10 NOTE — Progress Notes (Deleted)
Benito Mccreedy D.[place] Phone: [phone]   Assessment and Plan:     There are no diagnoses linked to this encounter.  ***   Pertinent previous records reviewed include ***   Follow Up: ***     Subjective:   I, Stevin Bielinski, am serving as a Education administrator for Doctor Glennon Mac   Chief Complaint: left side pain    HPI:    11/03/21 Patient is a 76 year old female complaining of  left side pain. Patient states that she fell yesterday she walking down the hall left shoulder , elbow , hand and some rib pain down her hip , knee, leg and low back had to take 2 tylenol was still able to ADLs but today has been in the bed all day long do to the pain.is getting numbness and tingling left elbow    11/06/2021 Patient states has some pain under her left breast isnt able to sleep on her left side  11/11/2021 Patient states   Relevant Historical Information: Hypertension, GERD, DM type II, osteoporosis, osteoarthritis of multiple joints  Additional pertinent review of systems negative.   Current Outpatient Medications:    AMBULATORY NON FORMULARY MEDICATION, Left wrist brace, Disp: 1 Piece, Rfl: 0   aspirin EC 81 MG tablet, Take 81 mg by mouth daily. Swallow whole., Disp: , Rfl:    benzonatate (TESSALON) 100 MG capsule, TAKE ONE CAPSULE BY MOUTH THREE TIMES A DAY AS NEEDED FOR COUGH, Disp: 45 capsule, Rfl: 0   Blood Glucose Monitoring Suppl (ONETOUCH VERIO FLEX SYSTEM) w/Device KIT, USE TO TEST BLOOD SUGAR DAILY AS DIRECTED, Disp: 1 kit, Rfl: 0   calcium elemental as carbonate (ANTACID MAXIMUM) 400 MG chewable tablet, Chew 1,000 mg by mouth as needed for heartburn., Disp: , Rfl:    carvedilol (COREG) 12.5 MG tablet, Take 1 tablet (12.5 mg total) by mouth 2 (two) times daily with a meal., Disp: 180 tablet, Rfl: 3   cholecalciferol (VITAMIN D3) 25 MCG (1000 UNIT) tablet, Take 1,000 Units by mouth daily., Disp: ,  Rfl:    Cyanocobalamin (B-12 PO), Take by mouth daily., Disp: , Rfl:    esomeprazole (NEXIUM) 40 MG capsule, TAKE ONE CAPSULE BY MOUTH TWICE A DAY 30 TO 60 MINUTES BEFORE YOUR FIRST AND LAST MEALS OF THE DAY *NEED APPOINTMENT FOR FURTHER REFILLS*, Disp: 180 capsule, Rfl: 0   famotidine (PEPCID) 20 MG tablet, TAKE ONE TABLET BY MOUTH EVERY NIGHT AT BEDTIME, Disp: 90 tablet, Rfl: 0   fesoterodine (TOVIAZ) 4 MG TB24 tablet, Take 4 mg daily by mouth., Disp: , Rfl:    gabapentin (NEURONTIN) 300 MG capsule, Take 1 capsule (300 mg total) by mouth at bedtime., Disp: 90 capsule, Rfl: 3   glucose blood (ONETOUCH VERIO) test strip, USE TO CHECK BLOOD SUGAE TWO TIMES A DAY, Disp: 100 strip, Rfl: 3   guaiFENesin (MUCINEX) 600 MG 12 hr tablet, Take 600 mg by mouth 2 (two) times daily as needed., Disp: , Rfl:    metFORMIN (GLUCOPHAGE-XR) 500 MG 24 hr tablet, TAKE TWO TABLETS BY MOUTH TWICE A DAY, Disp: 360 tablet, Rfl: 2   montelukast (SINGULAIR) 10 MG tablet, Take 1 tablet by mouth daily., Disp: , Rfl:    Olopatadine HCl 0.2 % SOLN, Place 1 drop into both eyes daily., Disp: 1.5 mL, Rfl: 11   Respiratory Therapy Supplies MISC, Use flutter valve as needed to break the coughing cycle., Disp: 1  each, Rfl: 1   rosuvastatin (CRESTOR) 20 MG tablet, Take 1 tablet (20 mg total) by mouth daily., Disp: 90 tablet, Rfl: 3   valsartan (DIOVAN) 160 MG tablet, Take 1 tablet (160 mg total) by mouth daily., Disp: 90 tablet, Rfl: 2   Objective:     There were no vitals filed for this visit.    There is no height or weight on file to calculate BMI.    Physical Exam:    ***   Electronically signed by:  Benito Mccreedy D.[facility] 8:49 AM 11/10/21

## 2021-11-11 ENCOUNTER — Ambulatory Visit: Payer: Medicare Other | Admitting: Sports Medicine

## 2021-11-12 ENCOUNTER — Other Ambulatory Visit: Payer: Self-pay | Admitting: Sports Medicine

## 2021-11-12 DIAGNOSIS — N1330 Unspecified hydronephrosis: Secondary | ICD-10-CM

## 2021-11-12 NOTE — Progress Notes (Signed)
Call and spoke with patient and patients daughter about referral to CT for abdomen and pelvis ?

## 2021-11-12 NOTE — Progress Notes (Signed)
Action items: Please contact patient and inform her that I have ordered CT abdomen/pelvis with contrast to further evaluate hydronephrosis (swollen kidney) seen incompletely on CT chest with contrast which was ordered to further evaluate a potential nodule seen on chest x-ray that was ordered due to a fall.  No nodule was seen on CT chest.  Incidental finding of hydronephrosis on CT chest.  I tried calling patient x1 which went to voicemail.  Thank you. ? ?I will contact patient with results of CT abdomen pelvis to discuss further treatment plan.

## 2021-11-12 NOTE — Progress Notes (Signed)
Pt was called and left a VM to call clinic to get update  ?

## 2021-11-12 NOTE — Progress Notes (Signed)
CT abdomen/pelvis with contrast ordered to further evaluate hydronephrosis seen incompletely on CT chest with contrast which was ordered to further evaluate a potential nodule seen on chest x-ray that was ordered due to a fall.  No nodule was seen on CT chest.  Incidental finding of hydronephrosis on CT chest. ? ?We will contact patient with results to discuss further treatment plan after imaging is completed. ?

## 2021-11-17 ENCOUNTER — Telehealth: Payer: Self-pay | Admitting: *Deleted

## 2021-11-17 NOTE — Telephone Encounter (Signed)
Pt called stating [facility] cannot get her in for her CT until June 8 & would like to have this done sooner at another location. Possibly [facility]?

## 2021-11-17 NOTE — Telephone Encounter (Signed)
No pre cert required for patient so she can go anywhere, I have sent a message to [place] at [facility] for them to call and schedule

## 2021-11-18 ENCOUNTER — Other Ambulatory Visit: Payer: Self-pay

## 2021-11-18 ENCOUNTER — Other Ambulatory Visit (INDEPENDENT_AMBULATORY_CARE_PROVIDER_SITE_OTHER): Payer: Medicare Other

## 2021-11-18 DIAGNOSIS — M2550 Pain in unspecified joint: Secondary | ICD-10-CM

## 2021-11-18 LAB — CBC WITH DIFFERENTIAL/PLATELET
Basophils Absolute: 0 10*3/uL (ref 0.0–0.1)
Basophils Relative: 0.5 % (ref 0.0–3.0)
Eosinophils Absolute: 0.3 10*3/uL (ref 0.0–0.7)
Eosinophils Relative: 3.9 % (ref 0.0–5.0)
HCT: 36.7 % (ref 36.0–46.0)
Hemoglobin: 12 g/dL (ref 12.0–15.0)
Lymphocytes Relative: 30.3 % (ref 12.0–46.0)
Lymphs Abs: 2.6 10*3/uL (ref 0.7–4.0)
MCHC: 32.7 g/dL (ref 30.0–36.0)
MCV: 86.7 fl (ref 78.0–100.0)
Monocytes Absolute: 0.9 10*3/uL (ref 0.1–1.0)
Monocytes Relative: 10.1 % (ref 3.0–12.0)
Neutro Abs: 4.7 10*3/uL (ref 1.4–7.7)
Neutrophils Relative %: 55.2 % (ref 43.0–77.0)
Platelets: 229 10*3/uL (ref 150.0–400.0)
RBC: 4.24 Mil/uL (ref 3.87–5.11)
RDW: 14.2 % (ref 11.5–15.5)
WBC: 8.5 10*3/uL (ref 4.0–10.5)

## 2021-11-18 LAB — COMPREHENSIVE METABOLIC PANEL
ALT: 23 U/L (ref 0–35)
AST: 22 U/L (ref 0–37)
Albumin: 3.8 g/dL (ref 3.5–5.2)
Alkaline Phosphatase: 82 U/L (ref 39–117)
BUN: 14 mg/dL (ref 6–23)
CO2: 31 mEq/L (ref 19–32)
Calcium: 9.4 mg/dL (ref 8.4–10.5)
Chloride: 103 mEq/L (ref 96–112)
Creatinine, Ser: 0.93 mg/dL (ref 0.40–1.20)
GFR: 59.78 mL/min — ABNORMAL LOW (ref >60.00)
Glucose, Bld: 130 mg/dL — ABNORMAL HIGH (ref 70–99)
Potassium: 3.6 mEq/L (ref 3.5–5.1)
Sodium: 141 mEq/L (ref 135–145)
Total Bilirubin: 0.3 mg/dL (ref 0.2–1.2)
Total Protein: 7.5 g/dL (ref 6.0–8.3)

## 2021-11-20 ENCOUNTER — Ambulatory Visit (INDEPENDENT_AMBULATORY_CARE_PROVIDER_SITE_OTHER)
Admission: RE | Admit: 2021-11-20 | Discharge: 2021-11-20 | Disposition: A | Discharge status: Home / Self Care | Payer: Medicare Other | Source: Ambulatory Visit | Attending: Sports Medicine | Admitting: Sports Medicine

## 2021-11-20 DIAGNOSIS — N134 Hydroureter: Secondary | ICD-10-CM | POA: Diagnosis not present

## 2021-11-20 DIAGNOSIS — N1330 Unspecified hydronephrosis: Secondary | ICD-10-CM

## 2021-11-20 MED ORDER — IOHEXOL 300 MG/ML  SOLN
100.0000 mL | Freq: Once | INTRAMUSCULAR | Status: AC | PRN
  Administered 2021-11-20: 100 mL via INTRAVENOUS

## 2021-11-25 ENCOUNTER — Other Ambulatory Visit: Payer: Self-pay | Admitting: Sports Medicine

## 2021-11-25 DIAGNOSIS — N1330 Unspecified hydronephrosis: Secondary | ICD-10-CM

## 2021-11-25 NOTE — Progress Notes (Signed)
Pt was called and notified about referral to urology

## 2021-12-03 DIAGNOSIS — N130 Hydronephrosis with ureteropelvic junction obstruction: Secondary | ICD-10-CM | POA: Diagnosis not present

## 2021-12-03 NOTE — Progress Notes (Signed)
Amy Escobar [address] [place] Phone: [phone] Subjective:   IVilma Meckel, am serving as a scribe for Dr. Hulan Saas.  I'm seeing this patient by the request  of:  [name], FNP  CC: knee pain   [id number]  09/30/2021 Patient given injection today and tolerated the procedure well, discussed icing regimen and home exercise, discussed which activities to do which wants to avoid.  Discussed which activities to potentially avoid.  Patient does not want to do the home exercises on a regular basis at the moment.  Follow-up with me again 6 to 8 weeks.  Can repeat every 3 months if needed  Chronic, with exacerbation.  Has had this difficulty for some time.  Discussed the possibility of a lumbar radiculopathy with radicular symptoms.  Discussed which activities to do which ones to avoid.  Follow-up again in 3 months  Update 12/09/2021 Amy Escobar is a 76 y.o. female coming in with complaint of B hip and B knee pain. Saw Dr. Glennon Mac twice in May 2023 for shoulder, elbow, and wrist pain after a fall. Worsening pain in the knees  Patient states still sore from fall. Time for injections in knees and wondering about GT injections because she has to see a kidney doctor.   Patient when seen another provider was found to have a possible lung nodule.  Was sent for a CT chest with contrast that did not show any significant nodule noted on CT scan but was found to have significant right hydronephrosis.  And then was sent for CT abdomen pelvis which did show that there was a significant right-sided hydronephrosis with possible stricture and no sign of true stone formation.  Patient also GFR has been decreased with most recent labs of 59.8    Past Medical History:  Diagnosis Date   Alkaline phosphatase deficiency    w/u Ne   Allergic rhinitis    Allergy    Anxiety    Arthritis    Cataract    BILATERAL-REMOVED   DM2 (diabetes  mellitus, type 2) (HCC)    GERD (gastroesophageal reflux disease)    Gout    Hemorrhoids    Hyperlipidemia    Hypertension    Mild pulmonary hypertension (HCC)    NSVT (nonsustained ventricular tachycardia) (HCC)    Osteoporosis    PVC's (premature ventricular contractions)    Thyroid nodule    small   Tubular adenoma of colon 2017   Urticaria    Uterine fibroid    Past Surgical History:  Procedure Laterality Date   CATARACT EXTRACTION Bilateral 2016   COLONOSCOPY  2007   echocardiogram (other)  01/16/2002   POLYPECTOMY     removed tumors from foot nerves  04/1999   stress cardiolite  02/12/2006   TOENAIL EXCISION     TUBAL LIGATION     TYMPANOSTOMY TUBE PLACEMENT     Social History   Socioeconomic History   Marital status: Widowed    Spouse name: Not on file   Number of children: 2   Years of education: Not on file   Highest education level: Not on file  Occupational History   Occupation: OTC CLERK    Employer: UNEMPLOYED  Tobacco Use   Smoking status: Former    Packs/day: 0.25    Years: 5.00    Total pack years: 1.25    Types: Cigarettes    Quit date: 06/29/1968    Years since quitting:  53.4   Smokeless tobacco: Never  Vaping Use   Vaping Use: Never used  Substance and Sexual Activity   Alcohol use: No    Alcohol/week: 0.0 standard drinks of alcohol   Drug use: No   Sexual activity: Not on file  Other Topics Concern   Not on file  Social History Narrative   Widowed 2010.    Social Determinants of Health   Financial Resource Strain: Low Risk  (10/07/2021)   Overall Financial Resource Strain (CARDIA)    Difficulty of Paying Living Expenses: Not hard at all  Food Insecurity: No Food Insecurity (10/07/2021)   Hunger Vital Sign    Worried About Running Out of Food in the Last Year: Never true    Ran Out of Food in the Last Year: Never true  Transportation Needs: No Transportation Needs (10/07/2021)   PRAPARE - Hydrologist  (Medical): No    Lack of Transportation (Non-Medical): No  Physical Activity: Inactive (10/07/2021)   Exercise Vital Sign    Days of Exercise per Week: 0 days    Minutes of Exercise per Session: 0 min  Stress: No Stress Concern Present (10/07/2021)   [place]    Feeling of Stress : Not at all  Social Connections: Moderately Integrated (10/07/2021)   Social Connection and Isolation Panel [NHANES]    Frequency of Communication with Friends and Family: More than three times a week    Frequency of Social Gatherings with Friends and Family: More than three times a week    Attends Religious Services: More than 4 times per year    Active Member of [employer] or Organizations: Yes    Attends Archivist Meetings: More than 4 times per year    Marital Status: Widowed   Allergies  Allergen Reactions   Olmesartan Medoxomil     REACTION: headache   Family History  Problem Relation Age of Onset   Heart disease Mother    Allergic rhinitis Mother    Heart disease Father    Stomach cancer Maternal Grandmother    Heart disease Maternal Grandfather    Rectal cancer Maternal Grandfather    Colon cancer Neg Hx    Breast cancer Neg Hx    Colon polyps Neg Hx    Esophageal cancer Neg Hx     Current Outpatient Medications (Endocrine & Metabolic):    metFORMIN (GLUCOPHAGE-XR) 500 MG 24 hr tablet, TAKE TWO TABLETS BY MOUTH TWICE A DAY  Current Outpatient Medications (Cardiovascular):    carvedilol (COREG) 12.5 MG tablet, Take 1 tablet (12.5 mg total) by mouth 2 (two) times daily with a meal.   rosuvastatin (CRESTOR) 20 MG tablet, Take 1 tablet (20 mg total) by mouth daily.   valsartan (DIOVAN) 160 MG tablet, Take 1 tablet (160 mg total) by mouth daily.  Current Outpatient Medications (Respiratory):    benzonatate (TESSALON) 100 MG capsule, TAKE ONE CAPSULE BY MOUTH THREE TIMES A DAY AS NEEDED FOR COUGH   guaiFENesin (MUCINEX)  600 MG 12 hr tablet, Take 600 mg by mouth 2 (two) times daily as needed.   montelukast (SINGULAIR) 10 MG tablet, Take 1 tablet by mouth daily.  Current Outpatient Medications (Analgesics):    aspirin EC 81 MG tablet, Take 81 mg by mouth daily. Swallow whole.  Current Outpatient Medications (Hematological):    Cyanocobalamin (B-12 PO), Take by mouth daily.  Current Outpatient Medications (Other):    AMBULATORY NON  FORMULARY MEDICATION, Left wrist brace   Blood Glucose Monitoring Suppl (ONETOUCH VERIO FLEX SYSTEM) w/Device KIT, USE TO TEST BLOOD SUGAR DAILY AS DIRECTED   calcium elemental as carbonate (ANTACID MAXIMUM) 400 MG chewable tablet, Chew 1,000 mg by mouth as needed for heartburn.   cholecalciferol (VITAMIN D3) 25 MCG (1000 UNIT) tablet, Take 1,000 Units by mouth daily.   esomeprazole (NEXIUM) 40 MG capsule, TAKE ONE CAPSULE BY MOUTH TWICE A DAY 30 TO 60 MINUTES BEFORE YOUR FIRST AND LAST MEALS OF THE DAY *NEED APPOINTMENT FOR FURTHER REFILLS*   famotidine (PEPCID) 20 MG tablet, TAKE ONE TABLET BY MOUTH EVERY NIGHT AT BEDTIME   fesoterodine (TOVIAZ) 4 MG TB24 tablet, Take 4 mg daily by mouth.   gabapentin (NEURONTIN) 300 MG capsule, Take 1 capsule (300 mg total) by mouth at bedtime.   glucose blood (ONETOUCH VERIO) test strip, USE TO CHECK BLOOD SUGAE TWO TIMES A DAY   Olopatadine HCl 0.2 % SOLN, Place 1 drop into both eyes daily.   Respiratory Therapy Supplies MISC, Use flutter valve as needed to break the coughing cycle.   Reviewed prior external information including notes and imaging from  primary care provider As well as notes that were available from care everywhere and other healthcare systems.  Past medical history, social, surgical and family history all reviewed in electronic medical record.  No pertanent information unless stated regarding to the chief complaint.   Review of Systems:  No headache, visual changes, nausea, vomiting, diarrhea, constipation, dizziness,  abdominal pain, skin rash, fevers, chills, night sweats, weight loss, swollen lymph nodes, body aches, joint swelling, chest pain, shortness of breath, mood changes. POSITIVE muscle aches  Objective  Blood pressure 118/82, pulse 62, height _0  (1.626 m), weight 160 lb (72.6 kg), SpO2 95 %.   General: No apparent distress alert and oriented x3 mood and affect normal, dressed appropriately.  HEENT: Pupils equal, extraocular movements intact  Respiratory: Patient's speak in full sentences and does not appear short of breath  Cardiovascular: No lower extremity edema, non tender, no erythema  Gait Uses the aid of a cane MSK:   Patient does have knee arthritis bilaterally.  Patient does have instability with valgus and varus force.  Patient does have trace effusion noted bilaterally.  Mild atrophy of the lower extremities. Patient does have some instability of the knees as stated.  After informed written and verbal consent, patient was seated on exam table. Right knee was prepped with alcohol swab and utilizing anterolateral approach, patient's right knee space was injected with 4:1  marcaine 0.5%: Kenalog 11m/dL. Patient tolerated the procedure well without immediate complications.  After informed written and verbal consent, patient was seated on exam table. Left knee was prepped with alcohol swab and utilizing anterolateral approach, patient's left knee space was injected with 4:1  marcaine 0.5%: Kenalog 423mdL. Patient tolerated the procedure well without immediate complications.   Impression and Recommendations:     The above documentation has been reviewed and is accurate and complete Amy PulleyDO

## 2021-12-04 ENCOUNTER — Other Ambulatory Visit: Payer: Medicare Other

## 2021-12-09 ENCOUNTER — Ambulatory Visit (INDEPENDENT_AMBULATORY_CARE_PROVIDER_SITE_OTHER): Payer: Medicare Other | Admitting: Family Medicine

## 2021-12-09 DIAGNOSIS — M170 Bilateral primary osteoarthritis of knee: Secondary | ICD-10-CM | POA: Diagnosis not present

## 2021-12-09 NOTE — Patient Instructions (Signed)
Injections in knees today If you have any questions you know where we are See you again in 3 months

## 2021-12-09 NOTE — Assessment & Plan Note (Signed)
Chronic problem with exacerbation.  Worsening pain again.  Due to patient's other comorbidities patient's injections are likely the safest thing for her at the moment.  Discussed icing regimen and home exercises.  Which activities to do which ones to avoid.  Follow-up again 2 to 3 months

## 2021-12-10 ENCOUNTER — Telehealth: Payer: Self-pay

## 2021-12-10 NOTE — Telephone Encounter (Signed)
Patient states that she is not taking any medication for her sugars. Patient states that her sugar have been going up. Patient states that she had a headache last night   6/14/ 271 9am Fasting   234 930am After breakfast Grits and 2 strips of bacon  257  1030   230 11:30am Had several bottles of water    232  2:36pm Had a [employer]

## 2021-12-10 NOTE — Telephone Encounter (Signed)
Patient states that she has bladder issues and that why she is not on Metformin. Patient will try the Glimepiride. I will have to see where to get her in at as Dr. Kelton Pillar doesn't have any openings.

## 2021-12-11 ENCOUNTER — Other Ambulatory Visit: Payer: Self-pay

## 2021-12-11 DIAGNOSIS — E118 Type 2 diabetes mellitus with unspecified complications: Secondary | ICD-10-CM

## 2021-12-11 MED ORDER — GLIMEPIRIDE 1 MG PO TABS: 1.0000 mg | ORAL_TABLET | Freq: Every day | ORAL | 2 refills | Status: DC

## 2021-12-11 NOTE — Telephone Encounter (Signed)
Patient needs to get in sooner but you are pretty full.

## 2021-12-11 NOTE — Telephone Encounter (Signed)
Where you gonna send in the Glimepiride

## 2021-12-11 NOTE — Telephone Encounter (Signed)
Rx sent to preferred pharmacy.

## 2021-12-12 ENCOUNTER — Telehealth: Payer: Self-pay

## 2021-12-12 NOTE — Telephone Encounter (Signed)
Per Mickel Baas- schedule appt to discuss- she is on vacation next week- Pt scheduled w/ Lovena Le on 12/16/21.

## 2021-12-12 NOTE — Telephone Encounter (Signed)
Pt requesting call back from Mickel Baas directly- I asked what it is regarding- she states its regarding insurance and a need for a home health aide. She may be reached at [phone] .

## 2021-12-16 ENCOUNTER — Encounter: Payer: Self-pay | Admitting: Family Medicine

## 2021-12-16 ENCOUNTER — Ambulatory Visit (INDEPENDENT_AMBULATORY_CARE_PROVIDER_SITE_OTHER): Payer: Medicare Other | Admitting: Family Medicine

## 2021-12-16 VITALS — BP 115/57 | HR 85 | Ht 64.0 in | Wt 162.8 lb

## 2021-12-16 DIAGNOSIS — Z9181 History of falling: Secondary | ICD-10-CM

## 2021-12-16 DIAGNOSIS — R531 Weakness: Secondary | ICD-10-CM

## 2021-12-16 DIAGNOSIS — R5381 Other malaise: Secondary | ICD-10-CM

## 2021-12-16 DIAGNOSIS — R2681 Unsteadiness on feet: Secondary | ICD-10-CM | POA: Diagnosis not present

## 2021-12-16 NOTE — Patient Instructions (Signed)
I placed an order to try to get you home physical therapy and an aide to help out. They will be calling you to evaluate and set this up if able. Please continue safety measures at home like you have been. Labs 3-4 weeks ago were stable.

## 2021-12-16 NOTE — Progress Notes (Addendum)
Acute Office Visit  Subjective:     Patient ID: Amy Escobar, female    DOB: 1946-01-24, 76 y.o.   MRN: [id number]  CC: recent fall, requesting home health aide   HPI Patient is in today requesting an order for home health aide due to recent fall.  Patient reports that she fell at home a little over a month ago while rushing to Something she had forgotten on her way to church.  States she does not know of anything specific that triggered her fall.  States she did not trip over anything, legs did not feel weak/unsteady, she did not have any dizziness, vision changes, syncopal symptoms.  States she has been doing pretty well overall, she did not break anything and has just been bruised/sore.  She has been following with sports medicine for other issues and no new concerns.  She has been using a walker or cane in and out of the home lately just to be extra safe as she does feel sore/weak from the fall still.  States she is having trouble getting her normal routine done and she is needing some assistance a few days per week at her home.  States she called her insurance and they told her that she was eligible for an aide.  States she would also like help with occasional ADLs, housework, getting to appointments as she is more cautious about going out and about now.    ROS All review of systems negative except what is listed in the HPI      Objective:    BP (!) 115/57   Pulse 85   Ht '5\' 4"'$  (1.626 m)   Wt 162 lb 12.8 oz (73.8 kg)   BMI 27.94 kg/m    Physical Exam Vitals reviewed.  Constitutional:      General: She is not in acute distress.    Appearance: Normal appearance. She is not ill-appearing.  Cardiovascular:     Rate and Rhythm: Normal rate and regular rhythm.  Pulmonary:     Effort: Pulmonary effort is normal.     Breath sounds: Normal breath sounds.  Musculoskeletal:        General: No swelling or tenderness.     Comments: Slow gait, using cane  Skin:    General:  Skin is warm and dry.  Neurological:     General: No focal deficit present.     Mental Status: She is alert and oriented to person, place, and time. Mental status is at baseline.  Psychiatric:        Mood and Affect: Mood normal.        Behavior: Behavior normal.        Thought Content: Thought content normal.        Judgment: Judgment normal.        No results found for any visits on 12/16/21.      Assessment & Plan:   1. History of recent fall 2. Generalized weakness 3. Physical deconditioning 4. Gait instability  Patient denies any acute concerns today. Fall was > 1 month ago and she has seen several different providers since then. No new symptoms. She continues to have some soreness and generalized weakness, now requiring cane or walker use in the home and when out of the home. She still drives but is leary of going to appointments/errands by herself. She is able to perform most ADLs though it takes her longer than before - she is requesting home health aid a few  days per week for assistance and agrees that she could benefit from PT strengthening exercises and safety with assistive devices.  Patient aware of signs/symptoms requiring further/urgent evaluation.   - Ambulatory referral to [facility]   Return if symptoms worsen or fail to improve, for / keep upcoming scheduled appointments .  Terrilyn Saver, NP

## 2021-12-22 ENCOUNTER — Telehealth: Payer: Self-pay | Admitting: Family

## 2021-12-24 ENCOUNTER — Ambulatory Visit: Payer: Medicare Other | Admitting: Podiatry

## 2021-12-25 ENCOUNTER — Other Ambulatory Visit: Payer: Self-pay | Admitting: Physician Assistant

## 2021-12-25 ENCOUNTER — Telehealth: Payer: Self-pay

## 2021-12-25 DIAGNOSIS — I2510 Atherosclerotic heart disease of native coronary artery without angina pectoris: Secondary | ICD-10-CM

## 2021-12-25 DIAGNOSIS — R338 Other retention of urine: Secondary | ICD-10-CM | POA: Diagnosis not present

## 2021-12-25 DIAGNOSIS — E782 Mixed hyperlipidemia: Secondary | ICD-10-CM

## 2021-12-25 DIAGNOSIS — I10 Essential (primary) hypertension: Secondary | ICD-10-CM

## 2021-12-25 DIAGNOSIS — N130 Hydronephrosis with ureteropelvic junction obstruction: Secondary | ICD-10-CM | POA: Diagnosis not present

## 2021-12-25 NOTE — Telephone Encounter (Signed)
ERROR

## 2021-12-26 ENCOUNTER — Telehealth: Payer: Self-pay | Admitting: Family

## 2021-12-26 DIAGNOSIS — M810 Age-related osteoporosis without current pathological fracture: Secondary | ICD-10-CM | POA: Diagnosis not present

## 2021-12-26 DIAGNOSIS — E113393 Type 2 diabetes mellitus with moderate nonproliferative diabetic retinopathy without macular edema, bilateral: Secondary | ICD-10-CM | POA: Diagnosis not present

## 2021-12-26 DIAGNOSIS — M109 Gout, unspecified: Secondary | ICD-10-CM | POA: Diagnosis not present

## 2021-12-26 DIAGNOSIS — I272 Pulmonary hypertension, unspecified: Secondary | ICD-10-CM | POA: Diagnosis not present

## 2021-12-26 DIAGNOSIS — E1142 Type 2 diabetes mellitus with diabetic polyneuropathy: Secondary | ICD-10-CM | POA: Diagnosis not present

## 2021-12-26 NOTE — Congregational Nurse Program (Signed)
Member left service, I checked on her.  Reports she is having some minimal pain in upper chest.  Has been dx with angina.  She felt better after a few minutes.  Then returned to [facility].   She will share last visit with me by text  Vinnie Langton, RN Congregational Nurse [phone]

## 2021-12-26 NOTE — Telephone Encounter (Signed)
Orders given.  

## 2021-12-26 NOTE — Telephone Encounter (Signed)
Caller/Agency: [place] Number: [phone] Requesting OT/PT/Skilled Nursing/Social Work/Speech Therapy: PT Frequency: 1 w 1 , 2 w 3, 1 w 2

## 2021-12-29 DIAGNOSIS — M109 Gout, unspecified: Secondary | ICD-10-CM | POA: Diagnosis not present

## 2021-12-29 DIAGNOSIS — I272 Pulmonary hypertension, unspecified: Secondary | ICD-10-CM | POA: Diagnosis not present

## 2021-12-29 DIAGNOSIS — R338 Other retention of urine: Secondary | ICD-10-CM | POA: Diagnosis not present

## 2021-12-29 DIAGNOSIS — E113393 Type 2 diabetes mellitus with moderate nonproliferative diabetic retinopathy without macular edema, bilateral: Secondary | ICD-10-CM | POA: Diagnosis not present

## 2021-12-29 DIAGNOSIS — E1142 Type 2 diabetes mellitus with diabetic polyneuropathy: Secondary | ICD-10-CM | POA: Diagnosis not present

## 2021-12-29 DIAGNOSIS — M810 Age-related osteoporosis without current pathological fracture: Secondary | ICD-10-CM | POA: Diagnosis not present

## 2021-12-31 DIAGNOSIS — E1142 Type 2 diabetes mellitus with diabetic polyneuropathy: Secondary | ICD-10-CM | POA: Diagnosis not present

## 2021-12-31 DIAGNOSIS — I272 Pulmonary hypertension, unspecified: Secondary | ICD-10-CM | POA: Diagnosis not present

## 2021-12-31 DIAGNOSIS — M109 Gout, unspecified: Secondary | ICD-10-CM | POA: Diagnosis not present

## 2021-12-31 DIAGNOSIS — E113393 Type 2 diabetes mellitus with moderate nonproliferative diabetic retinopathy without macular edema, bilateral: Secondary | ICD-10-CM | POA: Diagnosis not present

## 2021-12-31 DIAGNOSIS — M810 Age-related osteoporosis without current pathological fracture: Secondary | ICD-10-CM | POA: Diagnosis not present

## 2022-01-08 DIAGNOSIS — I272 Pulmonary hypertension, unspecified: Secondary | ICD-10-CM | POA: Diagnosis not present

## 2022-01-08 DIAGNOSIS — M810 Age-related osteoporosis without current pathological fracture: Secondary | ICD-10-CM | POA: Diagnosis not present

## 2022-01-08 DIAGNOSIS — E1142 Type 2 diabetes mellitus with diabetic polyneuropathy: Secondary | ICD-10-CM | POA: Diagnosis not present

## 2022-01-08 DIAGNOSIS — E113393 Type 2 diabetes mellitus with moderate nonproliferative diabetic retinopathy without macular edema, bilateral: Secondary | ICD-10-CM | POA: Diagnosis not present

## 2022-01-08 DIAGNOSIS — M109 Gout, unspecified: Secondary | ICD-10-CM | POA: Diagnosis not present

## 2022-01-09 DIAGNOSIS — E113393 Type 2 diabetes mellitus with moderate nonproliferative diabetic retinopathy without macular edema, bilateral: Secondary | ICD-10-CM | POA: Diagnosis not present

## 2022-01-09 DIAGNOSIS — M109 Gout, unspecified: Secondary | ICD-10-CM | POA: Diagnosis not present

## 2022-01-09 DIAGNOSIS — I272 Pulmonary hypertension, unspecified: Secondary | ICD-10-CM | POA: Diagnosis not present

## 2022-01-09 DIAGNOSIS — M810 Age-related osteoporosis without current pathological fracture: Secondary | ICD-10-CM | POA: Diagnosis not present

## 2022-01-09 DIAGNOSIS — E1142 Type 2 diabetes mellitus with diabetic polyneuropathy: Secondary | ICD-10-CM | POA: Diagnosis not present

## 2022-01-12 DIAGNOSIS — M810 Age-related osteoporosis without current pathological fracture: Secondary | ICD-10-CM | POA: Diagnosis not present

## 2022-01-12 DIAGNOSIS — E1142 Type 2 diabetes mellitus with diabetic polyneuropathy: Secondary | ICD-10-CM | POA: Diagnosis not present

## 2022-01-12 DIAGNOSIS — E113393 Type 2 diabetes mellitus with moderate nonproliferative diabetic retinopathy without macular edema, bilateral: Secondary | ICD-10-CM | POA: Diagnosis not present

## 2022-01-12 DIAGNOSIS — M109 Gout, unspecified: Secondary | ICD-10-CM | POA: Diagnosis not present

## 2022-01-12 DIAGNOSIS — I272 Pulmonary hypertension, unspecified: Secondary | ICD-10-CM | POA: Diagnosis not present

## 2022-01-13 DIAGNOSIS — M810 Age-related osteoporosis without current pathological fracture: Secondary | ICD-10-CM | POA: Diagnosis not present

## 2022-01-13 DIAGNOSIS — E113393 Type 2 diabetes mellitus with moderate nonproliferative diabetic retinopathy without macular edema, bilateral: Secondary | ICD-10-CM | POA: Diagnosis not present

## 2022-01-13 DIAGNOSIS — M109 Gout, unspecified: Secondary | ICD-10-CM | POA: Diagnosis not present

## 2022-01-13 DIAGNOSIS — E1142 Type 2 diabetes mellitus with diabetic polyneuropathy: Secondary | ICD-10-CM | POA: Diagnosis not present

## 2022-01-13 DIAGNOSIS — I272 Pulmonary hypertension, unspecified: Secondary | ICD-10-CM | POA: Diagnosis not present

## 2022-01-14 ENCOUNTER — Other Ambulatory Visit: Payer: Self-pay | Admitting: Ophthalmology

## 2022-01-14 DIAGNOSIS — N3289 Other specified disorders of bladder: Secondary | ICD-10-CM | POA: Diagnosis not present

## 2022-01-14 DIAGNOSIS — N1330 Unspecified hydronephrosis: Secondary | ICD-10-CM | POA: Diagnosis not present

## 2022-01-14 DIAGNOSIS — R338 Other retention of urine: Secondary | ICD-10-CM | POA: Diagnosis not present

## 2022-01-14 DIAGNOSIS — R339 Retention of urine, unspecified: Secondary | ICD-10-CM | POA: Diagnosis not present

## 2022-01-14 DIAGNOSIS — D303 Benign neoplasm of bladder: Secondary | ICD-10-CM | POA: Diagnosis not present

## 2022-01-20 ENCOUNTER — Telehealth: Payer: Self-pay | Admitting: Family

## 2022-01-20 NOTE — Telephone Encounter (Signed)
Pt called stating that a order needs to be put in for her to have have her [facility] PT with the following company:  [facility] [phone]

## 2022-01-20 NOTE — Telephone Encounter (Signed)
Called HH and Pt, the home health lady stated they are coming to reassess her tomorrow.  Called the pt and was advised

## 2022-01-20 NOTE — Telephone Encounter (Signed)
Called pt stated she has Home health PT coming out to her house but today is the last day. They need a new order for PT to  Come out starting on tomorrow

## 2022-01-21 DIAGNOSIS — I272 Pulmonary hypertension, unspecified: Secondary | ICD-10-CM | POA: Diagnosis not present

## 2022-01-21 DIAGNOSIS — E1142 Type 2 diabetes mellitus with diabetic polyneuropathy: Secondary | ICD-10-CM | POA: Diagnosis not present

## 2022-01-21 DIAGNOSIS — M810 Age-related osteoporosis without current pathological fracture: Secondary | ICD-10-CM | POA: Diagnosis not present

## 2022-01-21 DIAGNOSIS — E113393 Type 2 diabetes mellitus with moderate nonproliferative diabetic retinopathy without macular edema, bilateral: Secondary | ICD-10-CM | POA: Diagnosis not present

## 2022-01-21 DIAGNOSIS — M109 Gout, unspecified: Secondary | ICD-10-CM | POA: Diagnosis not present

## 2022-01-22 DIAGNOSIS — N3020 Other chronic cystitis without hematuria: Secondary | ICD-10-CM | POA: Diagnosis not present

## 2022-01-22 DIAGNOSIS — R338 Other retention of urine: Secondary | ICD-10-CM | POA: Diagnosis not present

## 2022-01-22 DIAGNOSIS — N130 Hydronephrosis with ureteropelvic junction obstruction: Secondary | ICD-10-CM | POA: Diagnosis not present

## 2022-01-22 NOTE — Telephone Encounter (Signed)
Amy Escobar from [facility] stated that the last visit with Amy Escobar, only has generalized weakness as the diagnosis which is not enough for medicare to cover the therapy. Medicare requires a qualifying diagnosis that causes the generalized weakness. Amy Escobar would like to know if the note from 12/16/2021 could be addended to reflect the cause of patient's generalized weakness. If this can be updated, please fax the note to [phone]. If there are any questions or concerns, please call [phone].

## 2022-01-23 NOTE — Telephone Encounter (Signed)
Updated note faxe.d

## 2022-01-29 ENCOUNTER — Other Ambulatory Visit: Payer: Self-pay

## 2022-01-29 DIAGNOSIS — E113393 Type 2 diabetes mellitus with moderate nonproliferative diabetic retinopathy without macular edema, bilateral: Secondary | ICD-10-CM | POA: Diagnosis not present

## 2022-01-29 DIAGNOSIS — M810 Age-related osteoporosis without current pathological fracture: Secondary | ICD-10-CM | POA: Diagnosis not present

## 2022-01-29 DIAGNOSIS — E1142 Type 2 diabetes mellitus with diabetic polyneuropathy: Secondary | ICD-10-CM | POA: Diagnosis not present

## 2022-01-29 DIAGNOSIS — R339 Retention of urine, unspecified: Secondary | ICD-10-CM | POA: Diagnosis not present

## 2022-01-29 DIAGNOSIS — I272 Pulmonary hypertension, unspecified: Secondary | ICD-10-CM | POA: Diagnosis not present

## 2022-01-29 DIAGNOSIS — M109 Gout, unspecified: Secondary | ICD-10-CM | POA: Diagnosis not present

## 2022-01-29 DIAGNOSIS — E118 Type 2 diabetes mellitus with unspecified complications: Secondary | ICD-10-CM

## 2022-01-29 MED ORDER — ONETOUCH VERIO VI STRP: ORAL_STRIP | 3 refills | Status: DC

## 2022-01-30 NOTE — Congregational Nurse Program (Signed)
Member  reports she has been trying to cut back on work as her back bothers her.  She has been helping others.  Feels need to focus on herself.  Vinnie Langton, RN, [place] Nurse, [phone]

## 2022-02-04 ENCOUNTER — Other Ambulatory Visit: Payer: Self-pay

## 2022-02-04 DIAGNOSIS — E118 Type 2 diabetes mellitus with unspecified complications: Secondary | ICD-10-CM

## 2022-02-04 MED ORDER — ONETOUCH VERIO VI STRP: ORAL_STRIP | 3 refills | Status: DC

## 2022-02-26 NOTE — Progress Notes (Signed)
Benito Mccreedy D.[facility] Phone: [phone]   Assessment and Plan:     1. Primary osteoarthritis of both knees - Chronic with exacerbation, subsequent sports medicine visit - Consistent with flare of osteoarthritis in bilateral knees - Patient's previous intra-articular knee CSI was performed on 12/09/2021 the patient received 8 to 10 weeks relief.  Advised patient that we ideally wait a minimum of 3 months in between CSI to allow for tissues to heal.  Patient is aware and is agreeable to repeating CSI 1 to 2 weeks before about 3 months.  At today's visit.  Tolerated well per note below.  Advised that bilateral CSI may temporarily increase patient's blood glucose with history of DM type II. - Patient is currently using a 1 pronged cane for support, but states she will feel unsteady at times.  Requesting a 4 pronged cane for additional support.  DME prescription provided today  Procedure: Knee Joint Injection Side: Bilateral Indication: Flare of osteoarthritis  Risks explained and consent was given verbally. The site was cleaned with alcohol prep. A needle was introduced with an anterio-lateral approach. Injection given using 4m of 1% lidocaine without epinephrine and 119mof kenalog 4054ml. This was well tolerated and resulted in symptomatic relief.  Needle was removed, hemostasis achieved, and post injection instructions were explained.  Procedure was repeated on contralateral side pt was advised to call or return to clinic if these symptoms worsen or fail to improve as anticipated.    Pertinent previous records reviewed include none   Follow Up: Has follow-up appointment with Dr. SmiTamala Julian 03/11/2022.  Could discuss HA injections versus hip CSI based on her symptom presentation at that visit.   Subjective:   I, MoePincus Badderm serving as a scrEducation administratorr Doctor BenGlennon Machief Complaint: bilateral knee  injection   HPI:  09/30/2021 Patient given injection today and tolerated the procedure well, discussed icing regimen and home exercise, discussed which activities to do which wants to avoid.  Discussed which activities to potentially avoid.  Patient does not want to do the home exercises on a regular basis at the moment.  Follow-up with me again 6 to 8 weeks.  Can repeat every 3 months if needed   Chronic, with exacerbation.  Has had this difficulty for some time.  Discussed the possibility of a lumbar radiculopathy with radicular symptoms.  Discussed which activities to do which ones to avoid.  Follow-up again in 3 months   Update 12/09/2021 MarAmen Dargis a 76 64o. female coming in with complaint of B hip and B knee pain. Saw Dr. JacGlennon Macice in May 2023 for shoulder, elbow, and wrist pain after a fall. Worsening pain in the knees  Patient states still sore from fall. Time for injections in knees and wondering about GT injections because she has to see a kidney doctor.     Patient when seen another provider was found to have a possible lung nodule.  Was sent for a CT chest with contrast that did not show any significant nodule noted on CT scan but was found to have significant right hydronephrosis.  And then was sent for CT abdomen pelvis which did show that there was a significant right-sided hydronephrosis with possible stricture and no sign of true stone formation.   Patient also GFR has been decreased with most recent labs of 59.8  02/27/2022 Patient states wants knee and injection and hip  injections  Relevant Historical Information: DM type II, hypertension  Additional pertinent review of systems negative.   Current Outpatient Medications:    AMBULATORY NON FORMULARY MEDICATION, Left wrist brace, Disp: 1 Piece, Rfl: 0   aspirin EC 81 MG tablet, Take 81 mg by mouth daily. Swallow whole., Disp: , Rfl:    Blood Glucose Monitoring Suppl (ONETOUCH VERIO FLEX SYSTEM) w/Device KIT, USE TO  TEST BLOOD SUGAR DAILY AS DIRECTED, Disp: 1 kit, Rfl: 0   calcium elemental as carbonate (ANTACID MAXIMUM) 400 MG chewable tablet, Chew 1,000 mg by mouth as needed for heartburn., Disp: , Rfl:    carvedilol (COREG) 12.5 MG tablet, TAKE 1 TABLET BY MOUTH TWO TIMES A DAY WITH A MEAL, Disp: 180 tablet, Rfl: 2   cholecalciferol (VITAMIN D3) 25 MCG (1000 UNIT) tablet, Take 1,000 Units by mouth daily., Disp: , Rfl:    Cyanocobalamin (B-12 PO), Take by mouth daily., Disp: , Rfl:    esomeprazole (NEXIUM) 40 MG capsule, TAKE ONE CAPSULE BY MOUTH TWICE A DAY 30 TO 60 MINUTES BEFORE YOUR FIRST AND LAST MEALS OF THE DAY *NEED APPOINTMENT FOR FURTHER REFILLS*, Disp: 180 capsule, Rfl: 0   famotidine (PEPCID) 20 MG tablet, TAKE ONE TABLET BY MOUTH EVERY NIGHT AT BEDTIME, Disp: 90 tablet, Rfl: 0   fesoterodine (TOVIAZ) 4 MG TB24 tablet, Take 4 mg daily by mouth., Disp: , Rfl:    gabapentin (NEURONTIN) 300 MG capsule, Take 1 capsule (300 mg total) by mouth at bedtime., Disp: 90 capsule, Rfl: 3   glimepiride (AMARYL) 1 MG tablet, Take 1 tablet (1 mg total) by mouth daily with breakfast., Disp: 30 tablet, Rfl: 2   glucose blood (ONETOUCH VERIO) test strip, USE TO CHECK BLOOD SUGAE TWO TIMES A DAY, Disp: 100 strip, Rfl: 3   guaiFENesin (MUCINEX) 600 MG 12 hr tablet, Take 600 mg by mouth 2 (two) times daily as needed., Disp: , Rfl:    metFORMIN (GLUCOPHAGE-XR) 500 MG 24 hr tablet, TAKE TWO TABLETS BY MOUTH TWICE A DAY, Disp: 360 tablet, Rfl: 2   montelukast (SINGULAIR) 10 MG tablet, Take 1 tablet by mouth daily., Disp: , Rfl:    Olopatadine HCl 0.2 % SOLN, Place 1 drop into both eyes daily., Disp: 1.5 mL, Rfl: 11   Respiratory Therapy Supplies MISC, Use flutter valve as needed to break the coughing cycle., Disp: 1 each, Rfl: 1   valsartan (DIOVAN) 160 MG tablet, Take 1 tablet (160 mg total) by mouth daily., Disp: 90 tablet, Rfl: 2   rosuvastatin (CRESTOR) 20 MG tablet, Take 1 tablet (20 mg total) by mouth daily., Disp:  90 tablet, Rfl: 3   Objective:     Vitals:   02/27/22 0800  BP: 130/82  Pulse: 96  SpO2: 98%  Weight: 162 lb (73.5 kg)  Height: _0  (1.626 m)      Body mass index is 27.81 kg/m.    Physical Exam:    General:  awake, alert oriented, no acute distress nontoxic Skin: no suspicious lesions or rashes Neuro:sensation intact, no deficits, strength 5/5 with no deficits, no atrophy, normal muscle tone Psych: No signs of anxiety, depression or other mood disorder  Bilateral knee: mild swelling No deformity Neg fluid wave, joint milking ROM Flex 100, Ext 5 TTP medial lateral joint line NTTP over the quad tendon, medial fem condyle, lat fem condyle, patella, plica, patella tendon, tibial tuberostiy, fibular head, posterior fossa, pes anserine bursa, gerdy's tubercle, medial jt line, lateral jt line  Gait slow with short steps.  Using a 1 pronged cane for support   Electronically signed by:  Benito Mccreedy D.[facility] 8:38 AM 02/27/22

## 2022-02-27 ENCOUNTER — Ambulatory Visit (INDEPENDENT_AMBULATORY_CARE_PROVIDER_SITE_OTHER): Payer: Medicare Other | Admitting: Sports Medicine

## 2022-02-27 VITALS — BP 130/82 | HR 96 | Ht 64.0 in | Wt 162.0 lb

## 2022-02-27 DIAGNOSIS — M170 Bilateral primary osteoarthritis of knee: Secondary | ICD-10-CM

## 2022-02-27 NOTE — Congregational Nurse Program (Signed)
Walking on a cane today, tells me she has given up "helping" people as she has lost some energy this last year.  Food bank proved too much for her.  She will come help with health fair.  Vinnie Langton, RN

## 2022-02-27 NOTE — Patient Instructions (Addendum)
Good to see you  You received a steroid shot in both knees today. You have an appointment on 03/11/2022 with Dr. Tamala Julian.  At that visit you could discuss gel injections for the knee or corticosteroid injections for her hips. We have provided you with a DME order for a 4 pronged cane at today's visit.

## 2022-03-01 NOTE — Congregational Nurse Program (Signed)
Still having difficulty with energy level.  Dressed nicely today  as always. Family member has covid.   Vinnie Langton, RN  CN  [phone]

## 2022-03-03 ENCOUNTER — Telehealth: Payer: Self-pay

## 2022-03-03 ENCOUNTER — Ambulatory Visit (INDEPENDENT_AMBULATORY_CARE_PROVIDER_SITE_OTHER): Payer: Medicare Other | Admitting: Family

## 2022-03-03 VITALS — BP 113/62 | HR 71 | Temp 97.6°F | Resp 18 | Ht 64.0 in | Wt 161.2 lb

## 2022-03-03 DIAGNOSIS — R058 Other specified cough: Secondary | ICD-10-CM

## 2022-03-03 DIAGNOSIS — I10 Essential (primary) hypertension: Secondary | ICD-10-CM | POA: Diagnosis not present

## 2022-03-03 DIAGNOSIS — E118 Type 2 diabetes mellitus with unspecified complications: Secondary | ICD-10-CM

## 2022-03-03 LAB — COMPREHENSIVE METABOLIC PANEL
ALT: 18 U/L (ref 0–35)
AST: 17 U/L (ref 0–37)
Albumin: 3.8 g/dL (ref 3.5–5.2)
Alkaline Phosphatase: 74 U/L (ref 39–117)
BUN: 16 mg/dL (ref 6–23)
CO2: 28 mEq/L (ref 19–32)
Calcium: 9.3 mg/dL (ref 8.4–10.5)
Chloride: 105 mEq/L (ref 96–112)
Creatinine, Ser: 0.84 mg/dL (ref 0.40–1.20)
GFR: 67.41 mL/min (ref >60.00)
Glucose, Bld: 112 mg/dL — ABNORMAL HIGH (ref 70–99)
Potassium: 4.1 mEq/L (ref 3.5–5.1)
Sodium: 142 mEq/L (ref 135–145)
Total Bilirubin: 0.5 mg/dL (ref 0.2–1.2)
Total Protein: 7.1 g/dL (ref 6.0–8.3)

## 2022-03-03 LAB — CBC WITH DIFFERENTIAL/PLATELET
Basophils Absolute: 0 10*3/uL (ref 0.0–0.1)
Basophils Relative: 0.3 % (ref 0.0–3.0)
Eosinophils Absolute: 0.1 10*3/uL (ref 0.0–0.7)
Eosinophils Relative: 1.2 % (ref 0.0–5.0)
HCT: 39.4 % (ref 36.0–46.0)
Hemoglobin: 13 g/dL (ref 12.0–15.0)
Lymphocytes Relative: 35 % (ref 12.0–46.0)
Lymphs Abs: 2.8 10*3/uL (ref 0.7–4.0)
MCHC: 32.9 g/dL (ref 30.0–36.0)
MCV: 86.3 fl (ref 78.0–100.0)
Monocytes Absolute: 0.9 10*3/uL (ref 0.1–1.0)
Monocytes Relative: 11.1 % (ref 3.0–12.0)
Neutro Abs: 4.3 10*3/uL (ref 1.4–7.7)
Neutrophils Relative %: 52.4 % (ref 43.0–77.0)
Platelets: 194 10*3/uL (ref 150.0–400.0)
RBC: 4.56 Mil/uL (ref 3.87–5.11)
RDW: 14.7 % (ref 11.5–15.5)
WBC: 8.1 10*3/uL (ref 4.0–10.5)

## 2022-03-03 LAB — HEMOGLOBIN A1C: Hgb A1c MFr Bld: 6.8 % — ABNORMAL HIGH (ref 4.6–6.5)

## 2022-03-03 MED ORDER — ESOMEPRAZOLE MAGNESIUM 40 MG PO CPDR: DELAYED_RELEASE_CAPSULE | ORAL | 0 refills | Status: DC

## 2022-03-03 NOTE — Telephone Encounter (Signed)
Appt today w/ PCP.

## 2022-03-03 NOTE — Progress Notes (Signed)
Amy Escobar is a 76 y.o. 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 without macular edema, without long-term current use of insulin ([place]) 06/18/2021   Diabetes mellitus ([place]) 06/18/2021   Osteoarthritis of right AC (acromioclavicular) joint 03/06/2021   Osteoarthritis of right glenohumeral joint 03/06/2021   Foot pain, bilateral 12/26/2020   Diabetic neuropathy ([place]) 01/10/2020   Hav (hallux abducto valgus), unspecified laterality 01/10/2020   Chronic arthropathy 01/10/2020   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 edema associated with type 2 diabetes mellitus ([place]) 12/26/2019   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   AC (acromioclavicular) arthritis 08/02/2019   Unspecified inflammatory spondylopathy, 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   Claudication ([place]) 04/19/2019   Morbid obesity ([place]) 04/19/2019   Suspected COVID-19 virus infection 04/13/2019   Upper airway cough syndrome 08/22/2018   Greater trochanteric bursitis of both hips 06/15/2018   Trigger point of shoulder region, left 02/07/2018   Hypertension    Mild pulmonary hypertension (HCC)    NSVT (nonsustained ventricular tachycardia) (HCC)    PVC's (premature ventricular contractions)    URI (upper respiratory infection) 08/09/2016   Neck pain 02/10/2016   Urinary incontinence 02/10/2016   Degenerative arthritis of knee, bilateral 07/17/2015   Knee MCL sprain 06/07/2015   Discomfort in chest 02/15/2015   Chest pain 02/15/2015   DM type 2, controlled, with complication ([place])    Wellness examination 08/06/2014   Acute meniscal tear of knee 02/13/2014   Primary localized osteoarthrosis, lower leg  02/13/2014   Gastrocnemius tear 12/25/2013   UTI (urinary tract infection) 12/20/2013   Right knee pain 12/20/2013   Pulmonary hypertension ([place]) 10/06/2013   DOE (dyspnea on exertion) 08/04/2013   Dysphagia, unspecified(787.20) 08/10/2012   Hip pain, left 02/02/2012   Painful respiration 05/25/2011   Encounter for long-term (current) use of other medications 01/30/2011   PELVIC PAIN, LEFT 07/30/2010   TOBACCO USE, QUIT 07/07/2010   FATIGUE 12/20/2009   Headache 12/20/2009   Low back pain 12/26/2008   Disorder of liver 04/13/2008   FOOT PAIN, BILATERAL 04/13/2008   FIBROIDS, UTERUS 11/24/2007   THYROID NODULE, LEFT 11/24/2007   HYPERCHOLESTEROLEMIA 11/24/2007   CARPAL TUNNEL SYNDROME, BILATERAL 11/24/2007   ALKALINE PHOSPHATASE, ELEVATED 11/24/2007   Gout 08/10/2007   ANXIETY 08/10/2007   Essential hypertension 08/10/2007   Cough 08/01/2007   Perennial and seasonal allergic rhinitis 01/14/2007   GERD 01/14/2007   Osteoporosis 01/14/2007    Current Outpatient Medications  Medication Sig Dispense Refill   AMBULATORY NON FORMULARY MEDICATION Left wrist brace 1 Piece 0   aspirin EC 81 MG tablet Take 81 mg by mouth daily. Swallow whole.     Blood Glucose Monitoring Suppl (ONETOUCH VERIO FLEX SYSTEM) w/Device KIT USE TO TEST BLOOD SUGAR DAILY AS DIRECTED 1 kit 0   calcium elemental as carbonate (ANTACID MAXIMUM) 400 MG chewable tablet Chew 1,000 mg by mouth as needed for heartburn.     carvedilol (COREG) 12.5 MG tablet TAKE 1 TABLET BY MOUTH TWO TIMES A DAY WITH A MEAL 180 tablet 2   cholecalciferol (VITAMIN D3) 25 MCG (1000 UNIT) tablet Take 1,000 Units by mouth daily.     famotidine (PEPCID) 20 MG tablet TAKE ONE TABLET BY MOUTH EVERY NIGHT  AT BEDTIME 90 tablet 0   fesoterodine (TOVIAZ) 4 MG TB24 tablet Take 4 mg daily by mouth.     gabapentin (NEURONTIN) 300 MG capsule Take 1 capsule (300 mg total) by mouth at bedtime. 90 capsule 3   glimepiride (AMARYL) 1 MG tablet Take 1  tablet (1 mg total) by mouth daily with breakfast. 30 tablet 2   glucose blood (ONETOUCH VERIO) test strip USE TO CHECK BLOOD SUGAE TWO TIMES A DAY 100 strip 3   guaiFENesin (MUCINEX) 600 MG 12 hr tablet Take 600 mg by mouth 2 (two) times daily as needed.     montelukast (SINGULAIR) 10 MG tablet Take 1 tablet by mouth daily.     Olopatadine HCl 0.2 % SOLN Place 1 drop into both eyes daily. 1.5 mL 11   Respiratory Therapy Supplies MISC Use flutter valve as needed to break the coughing cycle. 1 each 1   valsartan (DIOVAN) 160 MG tablet Take 1 tablet (160 mg total) by mouth daily. 90 tablet 2   esomeprazole (NEXIUM) 40 MG capsule TAKE ONE CAPSULE BY MOUTH TWICE A DAY 30 TO 60 MINUTES BEFORE YOUR FIRST AND LAST MEALS OF THE DAY *NEED APPOINTMENT FOR FURTHER REFILLS* 180 capsule 0   rosuvastatin (CRESTOR) 20 MG tablet Take 1 tablet (20 mg total) by mouth daily. 90 tablet 3   No current facility-administered medications for this visit.    Allergies: Olmesartan medoxomil  Past Medical History:  Diagnosis Date   Alkaline phosphatase deficiency    w/u Ne   Allergic rhinitis    Allergy    Anxiety    Arthritis    Cataract    BILATERAL-REMOVED   DM2 (diabetes mellitus, type 2) (HCC)    GERD (gastroesophageal reflux disease)    Gout    Hemorrhoids    Hyperlipidemia    Hypertension    Mild pulmonary hypertension (HCC)    NSVT (nonsustained ventricular tachycardia) (HCC)    Osteoporosis    PVC's (premature ventricular contractions)    Thyroid nodule    small   Tubular adenoma of colon 2017   Urticaria    Uterine fibroid     Past Surgical History:  Procedure Laterality Date   CATARACT EXTRACTION Bilateral 2016   COLONOSCOPY  2007   echocardiogram (other)  01/16/2002   POLYPECTOMY     removed tumors from foot nerves  04/1999   stress cardiolite  02/12/2006   TOENAIL EXCISION     TUBAL LIGATION     TYMPANOSTOMY TUBE PLACEMENT      Family History  Problem Relation Age of Onset    Heart disease Mother    Allergic rhinitis Mother    Heart disease Father    Stomach cancer Maternal Grandmother    Heart disease Maternal Grandfather    Rectal cancer Maternal Grandfather    Colon cancer Neg Hx    Breast cancer Neg Hx    Colon polyps Neg Hx    Esophageal cancer Neg Hx     Social History   Tobacco Use   Smoking status: Former    Packs/day: 0.25    Years: 5.00    Total pack years: 1.25    Types: Cigarettes    Quit date: 06/29/1968    Years since quitting: 53.7   Smokeless tobacco: Never  Substance Use Topics   Alcohol use: No    Alcohol/week: 0.0 standard drinks of alcohol    Subjective:   6 month follow up chronic needs; having increased problems  with acid reflux; only taking Nexium once per day; planning to schedule follow up with her pulmonologist; Is scheduled to see her cardiology and endocrinologist in the next few months; due to see eye doctor in November; would like to get labs updated today;   CT from May 2023 showed hydronephrosis- did follow up with urology as recommended; Dr. Gloriann Loan was planning to place stent but area of concern did heal- no surgery;    Objective:  Vitals:   03/03/22 0810  BP: 113/62  Pulse: 71  Resp: 18  Temp: 97.6 F (36.4 C)  TempSrc: Temporal  SpO2: 97%  Weight: 161 lb 3.2 oz (73.1 kg)  Height: _0  (1.626 m)    General: Well developed, well nourished, in no acute distress  Skin : Warm and dry.  Head: Normocephalic and atraumatic  Lungs: Respirations unlabored; clear to auscultation bilaterally without wheeze, rales, rhonchi  CVS exam: normal rate and regular rhythm.  Neurologic: Alert and oriented; speech intact; face symmetrical; moves all extremities well; CNII-XII intact without focal deficit   Assessment:  1. Essential hypertension   2. Upper airway cough syndrome   3. DM type 2, controlled, with complication ([place])     Plan:  Stable; keep planned follow up with cardiology; Encouraged to take her Nexium bid  as ordered; schedule a follow up with Dr. Melvyn Novas. Stable; check Hgba1c today; keep planned follow up with endocrine.   No follow-ups on file.  Orders Placed This Encounter  Procedures   CBC with Differential/Platelet   Comp Met (CMET)   Hemoglobin A1c    Requested Prescriptions   Signed Prescriptions Disp Refills   esomeprazole (NEXIUM) 40 MG capsule 180 capsule 0    Sig: TAKE ONE CAPSULE BY MOUTH TWICE A DAY 30 TO 60 MINUTES BEFORE YOUR FIRST AND LAST MEALS OF THE DAY *NEED APPOINTMENT FOR FURTHER REFILLS*

## 2022-03-03 NOTE — Telephone Encounter (Signed)
Nurse Assessment Nurse: Marcelline Deist, RN, Mickel Baas Date/Time Amy Escobar Time): 02/27/2022 9:03:41 PM Confirm and document reason for call. If symptomatic, describe symptoms. ---Caller states she is having a problem with her blood pressure. She states she has had a bad day today. She had two cortisone shots in her knees today and then had an issue at home. It is now 143/99. She wants to know if she can take another blood pressure pill to bring it down. She has a bad headache too. Does the patient have any new or worsening symptoms? ---Yes Will a triage be completed? ---Yes Related visit to physician within the last 2 weeks? ---No Does the PT have any chronic conditions? (i.e. diabetes, asthma, this includes High risk factors for pregnancy, etc.) ---Yes List chronic conditions. ---HTN, Diabetes, Bladder problems and does self cath. Is this a behavioral health or substance abuse call? ---No Guidelines Guideline Title Affirmed Question Affirmed Notes Nurse Date/Time (Eastern Time) Blood Pressure - High [8] Systolic BP >= 546 OR Diastolic >= Marcelline Deist, RN, Mickel Baas 02/27/2022 9:05:44 PM PLEASE NOTE: All timestamps contained within this report are represented as [place] Standard Time. CONFIDENTIALTY NOTICE: This fax transmission is intended only for the addressee. It contains information that is legally privileged, confidential or otherwise protected from use or disclosure. If you are not the intended recipient, you are strictly prohibited from reviewing, disclosing, copying using or disseminating any of this information or taking any action in reliance on or regarding this information. If you have received this fax in error, please notify us immediately by telephone so that we can arrange for its return to [place]. Phone: [phone], Toll-Free: [phone], Fax: [phone] Page: 2 of 2 Call Id: [id number] Guidelines Guideline Title Affirmed Question Affirmed Notes Nurse Date/Time Amy Escobar Time) 80 AND  [2] taking BP medications Disp. Time Amy Escobar Time) Disposition Final User 02/27/2022 9:08:29 PM See PCP within 2 Weeks Yes Marcelline Deist, RN, Mickel Baas Final Disposition 02/27/2022 9:08:29 PM See PCP within 2 Weeks Yes Marcelline Deist, RN, Elige Radon Disagree/Comply Comply Caller Understands Yes PreDisposition Call Doctor Care Advice Given Per Guideline SEE PCP WITHIN 2 WEEKS: CALL BACK IF: * Your blood pressure is over 160/100 * Chest pain or difficulty breathing occurs * Difficulty walking, difficulty talking, or severe headache occurs * You become worse

## 2022-03-04 NOTE — Progress Notes (Addendum)
Amy Escobar Phone: [phone] Subjective:   [name], am serving as a scribe for Dr. Hulan Saas.  I'm seeing this patient by the request  of:  [name], FNP  CC: bilateral knee and hips   IOM:[id number]  Amy Escobar is a 76 y.o. female coming in with complaint of B knee pain. Injected by Dr. Glennon Mac on 02/27/2022. Durolane approved. Patient states that she is would like injections in backside and gel injections.      Past Medical History:  Diagnosis Date   Alkaline phosphatase deficiency    w/u Ne   Allergic rhinitis    Allergy    Anxiety    Arthritis    Cataract    BILATERAL-REMOVED   DM2 (diabetes mellitus, type 2) (HCC)    GERD (gastroesophageal reflux disease)    Gout    Hemorrhoids    Hyperlipidemia    Hypertension    Mild pulmonary hypertension (HCC)    NSVT (nonsustained ventricular tachycardia) (HCC)    Osteoporosis    PVC's (premature ventricular contractions)    Thyroid nodule    small   Tubular adenoma of colon 2017   Urticaria    Uterine fibroid    Past Surgical History:  Procedure Laterality Date   CATARACT EXTRACTION Bilateral 2016   COLONOSCOPY  2007   echocardiogram (other)  01/16/2002   POLYPECTOMY     removed tumors from foot nerves  04/1999   stress cardiolite  02/12/2006   TOENAIL EXCISION     TUBAL LIGATION     TYMPANOSTOMY TUBE PLACEMENT     Social History   Socioeconomic History   Marital status: Widowed    Spouse name: Not on file   Number of children: 2   Years of education: Not on file   Highest education level: Not on file  Occupational History   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    Social Determinants of Health   Financial Resource Strain: Low Risk  (10/07/2021)   Overall Financial Resource Strain (CARDIA)    Difficulty of Paying Living Expenses: Not hard at all  Food Insecurity: No Food Insecurity (10/07/2021)   Hunger Vital Sign    Worried About Running Out of Food in the Last Year: Never true    Ran Out of Food in the Last Year: Never true  Transportation Needs: No Transportation Needs (10/07/2021)   PRAPARE - Hydrologist (Medical): No    Lack of Transportation (Non-Medical): No  Physical Activity: Inactive (10/07/2021)   Exercise Vital Sign    Days of Exercise per Week: 0 days    Minutes of Exercise per Session: 0 min  Stress: No Stress Concern Present (10/07/2021)   [place]  Feeling of Stress : Not at all  Social Connections: Moderately Integrated (10/07/2021)   Social Connection and Isolation Panel [NHANES]    Frequency of Communication with Friends and Family: More than three times a week    Frequency of Social Gatherings with Friends and Family: More than three times a week    Attends Religious Services: More than 4 times per year    Active Member of [employer] or Organizations: Yes    Attends Archivist Meetings: More than 4 times per year    Marital Status: Widowed   Allergies  Allergen Reactions   Olmesartan Medoxomil     REACTION: headache   Family History  Problem Relation Age of Onset   Heart disease Mother    Allergic rhinitis Mother    Heart disease Father    Stomach cancer Maternal Grandmother    Heart disease Maternal Grandfather    Rectal cancer Maternal Grandfather    Colon cancer Neg Hx    Breast  cancer Neg Hx    Colon polyps Neg Hx    Esophageal cancer Neg Hx     Current Outpatient Medications (Endocrine & Metabolic):    glimepiride (AMARYL) 1 MG tablet, TAKE ONE TABLET BY MOUTH DAILY WITH BREAKFAST  Current Outpatient Medications (Cardiovascular):    carvedilol (COREG) 12.5 MG tablet, TAKE 1 TABLET BY MOUTH TWO TIMES A DAY WITH A MEAL   valsartan (DIOVAN) 160 MG tablet, Take 1 tablet (160 mg total) by mouth daily.   rosuvastatin (CRESTOR) 20 MG tablet, Take 1 tablet (20 mg total) by mouth daily.  Current Outpatient Medications (Respiratory):    guaiFENesin (MUCINEX) 600 MG 12 hr tablet, Take 600 mg by mouth 2 (two) times daily as needed.   montelukast (SINGULAIR) 10 MG tablet, Take 1 tablet by mouth daily.  Current Outpatient Medications (Analgesics):    aspirin EC 81 MG tablet, Take 81 mg by mouth daily. Swallow whole.   Current Outpatient Medications (Other):    AMBULATORY NON FORMULARY MEDICATION, Left wrist brace   Blood Glucose Monitoring Suppl (ONETOUCH VERIO FLEX SYSTEM) w/Device KIT, USE TO TEST BLOOD SUGAR DAILY AS DIRECTED   calcium elemental as carbonate (ANTACID MAXIMUM) 400 MG chewable tablet, Chew 1,000 mg by mouth as needed for heartburn.   cephALEXin (KEFLEX) 250 MG capsule, Take 250 mg by mouth daily.   cholecalciferol (VITAMIN D3) 25 MCG (1000 UNIT) tablet, Take 1,000 Units by mouth daily.   esomeprazole (NEXIUM) 40 MG capsule, TAKE ONE CAPSULE BY MOUTH TWICE A DAY 30 TO 60 MINUTES BEFORE YOUR FIRST AND LAST MEALS OF THE DAY *NEED APPOINTMENT FOR FURTHER REFILLS*   famotidine (PEPCID) 20 MG tablet, TAKE ONE TABLET BY MOUTH EVERY NIGHT AT BEDTIME   fesoterodine (TOVIAZ) 4 MG TB24 tablet, Take 4 mg daily by mouth.   gabapentin (NEURONTIN) 300 MG capsule, Take 1 capsule (300 mg total) by mouth at bedtime.   glucose blood (ONETOUCH VERIO) test strip, USE TO CHECK BLOOD SUGAE TWO TIMES A DAY   Olopatadine HCl 0.2 % SOLN, Place 1 drop into both eyes daily.    Respiratory Therapy Supplies MISC, Use flutter valve as needed to break the coughing cycle.   Reviewed prior external information including notes and imaging from  primary care provider As well as notes that were available from care everywhere and other healthcare systems.  Past medical history, social, surgical and family history all reviewed in electronic medical record.  No pertanent information unless stated regarding to the chief  complaint.   Review of Systems:  No headache, visual changes, nausea, vomiting, diarrhea, constipation, dizziness, abdominal pain, skin rash, fevers, chills, night sweats, weight loss, swollen lymph nodes, body aches, joint swelling, chest pain, shortness of breath, mood changes. POSITIVE muscle aches  Objective  Blood pressure 114/72, pulse 75, height _0  (1.626 m), weight 163 lb (73.9 kg), SpO2 97 %.   General: No apparent distress alert and oriented x3 mood and affect normal, dressed appropriately.  HEENT: Pupils equal, extraocular movements intact  Respiratory: Patient's speak in full sentences and does not appear short of breath  Cardiovascular: No lower extremity edema, non tender, no erythema  Bilateral OA with instability  TTP GT injection- positive faber   After informed written and verbal consent, patient was seated on exam table. Right knee was prepped with alcohol swab and utilizing anterolateral approach, patient's right knee space was injected with 60 mg per 3 mL of Durolane (sodium hyaluronate) in a prefilled syringe was injected easily into the knee through a 22-gauge needle..Patient tolerated the procedure well without immediate complications.  After informed written and verbal consent, patient was seated on exam table. Left knee was prepped with alcohol swab and utilizing anterolateral approach, patient's left knee space was injected with 60 mg per 3 mL of Durolane (sodium hyaluronate) in a prefilled syringe was injected easily into the knee  through a 22-gauge needle..Patient tolerated the procedure well without immediate complications.   After verbal consent patient was prepped with alcohol swab and with a 21-gauge 2 inch needle injected into the left  greater trochanteric area with 2 cc of 0.5% Marcaine and 1 cc of Kenalog 40 mg/mL.  No blood loss.  Band-Aid placed.  Postinjection instructions given   After verbal consent patient was prepped with alcohol swab and with a 21-gauge 2 inch needle injected into the right   greater trochanteric area with 2 cc of 0.5% Marcaine and 1 cc of Kenalog 40 mg/mL.  No blood loss.  Band-Aid placed.  Postinjection instructions given    Impression and Recommendations:     The above documentation has been reviewed and is accurate and complete Lyndal Pulley, DO

## 2022-03-06 NOTE — Progress Notes (Unsigned)
Office Visit    Patient Name: Amy Escobar Date of Encounter: 03/06/2022  Primary Care Provider:  [name], [place] Primary Cardiologist:  Freada Bergeron, MD Primary Electrophysiologist: None  Chief Complaint    Amy Escobar is a 76 y.o. female with PMH of HTN, HLD, IDDM, nonobstructive CAD, moderate pulmonary HTN who presents today for 53-monthfollow-up of CAD.  Past Medical History    Past Medical History:  Diagnosis Date   Alkaline phosphatase deficiency    w/u Ne   Allergic rhinitis    Allergy    Anxiety    Arthritis    Cataract    BILATERAL-REMOVED   DM2 (diabetes mellitus, type 2) (HCC)    GERD (gastroesophageal reflux disease)    Gout    Hemorrhoids    Hyperlipidemia    Hypertension    Mild pulmonary hypertension (HCC)    [place]    Osteoporosis    PVC's (premature ventricular contractions)    Thyroid nodule    small   Tubular adenoma of colon 2017   Urticaria    Uterine fibroid    Past Surgical History:  Procedure Laterality Date   CATARACT EXTRACTION Bilateral 2016   COLONOSCOPY  2007   echocardiogram (other)  01/16/2002   POLYPECTOMY     removed tumors from foot nerves  04/1999   stress cardiolite  02/12/2006   TOENAIL EXCISION     TUBAL LIGATION     TYMPANOSTOMY TUBE PLACEMENT      Allergies  Allergies  Allergen Reactions   Olmesartan Medoxomil     REACTION: headache    History of Present Illness    Amy Escobar a 76year old female with the above-mentioned past medical history who presents today for 674-monthollow-up of coronary artery disease.  She was previously followed by Dr. NeMeda Coffeend is currently seeing Dr. PeJohney Frame Patient has a history of nonobstructive CAD that was diagnosed following coronary CTA in 2018.  Patient's calcium score was 220 and had moderate plaque in the ostial left main and mild plaque in the ostial RCA.  2D echo was completed in 02/2019  due to complaint of dyspnea on exertion and chest pain.  EF of 55-60% and borderline enlarged pulmonary artery, normal RVSP.  Lexiscan Myoview was completed 02/2019 that was normal with no ischemia.  She was last seen by Dr. PeJohney Framen 08/2021 and was doing well from a cardiovascular standpoint.  She was dealing with COVID infection and had residual congestion.   Since last being seen in the office patient reports***.  Patient denies chest pain, palpitations, dyspnea, PND, orthopnea, nausea, vomiting, dizziness, syncope, edema, weight gain, or early satiety.   ***Notes:  Home Medications    Current Outpatient Medications  Medication Sig Dispense Refill   AMBULATORY NON FORMULARY MEDICATION Left wrist brace 1 Piece 0   aspirin EC 81 MG tablet Take 81 mg by mouth daily. Swallow whole.     Blood Glucose Monitoring Suppl (ONETOUCH VERIO FLEX SYSTEM) w/Device KIT USE TO TEST BLOOD SUGAR DAILY AS DIRECTED 1 kit 0   calcium elemental as carbonate (ANTACID MAXIMUM) 400 MG chewable tablet Chew 1,000 mg by mouth as needed for heartburn.     carvedilol (COREG) 12.5 MG tablet TAKE 1 TABLET BY MOUTH TWO TIMES A DAY WITH A MEAL 180 tablet 2   cholecalciferol (VITAMIN D3) 25 MCG (1000 UNIT) tablet Take 1,000 Units by mouth daily.  esomeprazole (NEXIUM) 40 MG capsule TAKE ONE CAPSULE BY MOUTH TWICE A DAY 30 TO 60 MINUTES BEFORE YOUR FIRST AND LAST MEALS OF THE DAY *NEED APPOINTMENT FOR FURTHER REFILLS* 180 capsule 0   famotidine (PEPCID) 20 MG tablet TAKE ONE TABLET BY MOUTH EVERY NIGHT AT BEDTIME 90 tablet 0   fesoterodine (TOVIAZ) 4 MG TB24 tablet Take 4 mg daily by mouth.     gabapentin (NEURONTIN) 300 MG capsule Take 1 capsule (300 mg total) by mouth at bedtime. 90 capsule 3   glimepiride (AMARYL) 1 MG tablet Take 1 tablet (1 mg total) by mouth daily with breakfast. 30 tablet 2   glucose blood (ONETOUCH VERIO) test strip USE TO CHECK BLOOD SUGAE TWO TIMES A DAY 100 strip 3   guaiFENesin (MUCINEX)  600 MG 12 hr tablet Take 600 mg by mouth 2 (two) times daily as needed.     montelukast (SINGULAIR) 10 MG tablet Take 1 tablet by mouth daily.     Olopatadine HCl 0.2 % SOLN Place 1 drop into both eyes daily. 1.5 mL 11   Respiratory Therapy Supplies MISC Use flutter valve as needed to break the coughing cycle. 1 each 1   rosuvastatin (CRESTOR) 20 MG tablet Take 1 tablet (20 mg total) by mouth daily. 90 tablet 3   valsartan (DIOVAN) 160 MG tablet Take 1 tablet (160 mg total) by mouth daily. 90 tablet 2   No current facility-administered medications for this visit.     Review of Systems  Please see the history of present illness.    (+)*** (+)***  All other systems reviewed and are otherwise negative except as noted above.  Physical Exam    Wt Readings from Last 3 Encounters:  03/03/22 161 lb 3.2 oz (73.1 kg)  02/27/22 162 lb (73.5 kg)  12/16/21 162 lb 12.8 oz (73.8 kg)   EB:RAXEN were no vitals filed for this visit.,There is no height or weight on file to calculate BMI.  Constitutional:      Appearance: Healthy appearance. Not in distress.  Neck:     Vascular: JVD normal.  Pulmonary:     Effort: Pulmonary effort is normal.     Breath sounds: No wheezing. No rales. Diminished in the bases Cardiovascular:     Normal rate. Regular rhythm. Normal S1. Normal S2.      Murmurs: There is no murmur.  Edema:    Peripheral edema absent.  Abdominal:     Palpations: Abdomen is soft non tender. There is no hepatomegaly.  Skin:    General: Skin is warm and dry.  Neurological:     General: No focal deficit present.     Mental Status: Alert and oriented to person, place and time.     Cranial Nerves: Cranial nerves are intact.  EKG/LABS/Other Studies Reviewed    ECG personally reviewed by me today - ***  Risk Assessment/Calculations:   {Does this patient have ATRIAL FIBRILLATION?[phone]}        Lab Results  Component Value Date   WBC 8.1 03/03/2022   HGB 13.0 03/03/2022    HCT 39.4 03/03/2022   MCV 86.3 03/03/2022   PLT 194.0 03/03/2022   Lab Results  Component Value Date   CREATININE 0.84 03/03/2022   BUN 16 03/03/2022   NA 142 03/03/2022   K 4.1 03/03/2022   CL 105 03/03/2022   CO2 28 03/03/2022   Lab Results  Component Value Date   ALT 18 03/03/2022   AST 17 03/03/2022  ALKPHOS 74 03/03/2022   BILITOT 0.5 03/03/2022   Lab Results  Component Value Date   CHOL 132 09/16/2021   HDL 41.20 09/16/2021   LDLCALC 71 09/16/2021   LDLDIRECT 165.6 06/19/2009   TRIG 99.0 09/16/2021   CHOLHDL 3 09/16/2021    Lab Results  Component Value Date   HGBA1C 6.8 (H) 03/03/2022    Assessment & Plan    1.  Nonobstructive CAD  2.  HTN  3.  HLD  4.  Pulmonary hypertension      Disposition: Follow-up with Freada Bergeron, MD or APP in *** months {Are you ordering a CV Procedure (e.g. stress test, cath, DCCV, TEE, etc)?   Press F2        :[id number]}   Medication Adjustments/Labs and Tests Ordered: Current medicines are reviewed at length with the patient today.  Concerns regarding medicines are outlined above.   Signed, Mable Fill, Marissa Nestle, NP 03/06/2022, 12:44 PM [place]

## 2022-03-07 ENCOUNTER — Other Ambulatory Visit: Payer: Self-pay | Admitting: Endocrinology

## 2022-03-07 DIAGNOSIS — E118 Type 2 diabetes mellitus with unspecified complications: Secondary | ICD-10-CM

## 2022-03-10 ENCOUNTER — Ambulatory Visit: Payer: Medicare Other | Attending: Cardiology | Admitting: Nurse Practitioner

## 2022-03-10 ENCOUNTER — Ambulatory Visit: Payer: Medicare Other | Attending: Nurse Practitioner

## 2022-03-10 ENCOUNTER — Encounter: Payer: Self-pay | Admitting: Nurse Practitioner

## 2022-03-10 VITALS — BP 118/62 | HR 72 | Ht 64.0 in | Wt 163.0 lb

## 2022-03-10 DIAGNOSIS — E782 Mixed hyperlipidemia: Secondary | ICD-10-CM

## 2022-03-10 DIAGNOSIS — I10 Essential (primary) hypertension: Secondary | ICD-10-CM | POA: Diagnosis not present

## 2022-03-10 DIAGNOSIS — R0602 Shortness of breath: Secondary | ICD-10-CM

## 2022-03-10 DIAGNOSIS — R002 Palpitations: Secondary | ICD-10-CM | POA: Diagnosis not present

## 2022-03-10 DIAGNOSIS — I25118 Atherosclerotic heart disease of native coronary artery with other forms of angina pectoris: Secondary | ICD-10-CM | POA: Diagnosis not present

## 2022-03-10 NOTE — Progress Notes (Unsigned)
Enrolled for [employer] to mail a ZIO XT long term holter monitor to the patients address on file.  ? ?Dr. Pemberton to read. ?

## 2022-03-10 NOTE — Patient Instructions (Signed)
Medication Instructions:  Your physician recommends that you continue on your current medications as directed. Please refer to the Current Medication list given to you today.  *If you need a refill on your cardiac medications before your next appointment, please call your pharmacy*   Lab Work: NONE If you have labs (blood work) drawn today and your tests are completely normal, you will receive your results only by: [place] (if you have MyChart) OR A paper copy in the mail If you have any lab test that is abnormal or we need to change your treatment, we will call you to review the results.   Testing/Procedures: Your physician has requested that you have a lexiscan myoview. For further information please visit [email or web address]. Please follow instruction sheet, as given.   Your physician has requested that you have an echocardiogram. Echocardiography is a painless test that uses sound waves to create images of your heart. It provides your doctor with information about the size and shape of your heart and how well your heart's chambers and valves are working. This procedure takes approximately one hour. There are no restrictions for this procedure.   ZIO XT- Long Term Monitor Instructions  Your physician has requested you wear a ZIO patch monitor for 7 days.  This is a single patch monitor. [employer] supplies one patch monitor per enrollment. Additional stickers are not available. Please do not apply patch if you will be having a Nuclear Stress Test,  Echocardiogram, Cardiac CT, MRI, or Chest Xray during the period you would be wearing the  monitor. The patch cannot be worn during these tests. You cannot remove and re-apply the  ZIO XT patch monitor.  Your ZIO patch monitor will be mailed 3 day USPS to your address on file. It may take 3-5 days  to receive your monitor after you have been enrolled.  Once you have received your monitor, please review the enclosed instructions. Your  monitor  has already been registered assigning a specific monitor serial # to you.  Billing and Patient Assistance Program Information  We have supplied [employer] with any of your insurance information on file for billing purposes. [employer] offers a sliding scale Patient Assistance Program for patients that do not have  insurance, or whose insurance does not completely cover the cost of the ZIO monitor.  You must apply for the Patient Assistance Program to qualify for this discounted rate.  To apply, please call [employer] at [phone], select option 4, select option 2, ask to apply for  Patient Assistance Program. Theodore Demark will ask your household income, and how many people  are in your household. They will quote your out-of-pocket cost based on that information.  [employer] will also be able to set up a 64-month interest-free payment plan if needed.  Applying the monitor   Shave hair from upper left chest.  Hold abrader disc by orange tab. Rub abrader in 40 strokes over the upper left chest as  indicated in your monitor instructions.  Clean area with 4 enclosed alcohol pads. Let dry.  Apply patch as indicated in monitor instructions. Patch will be placed under collarbone on left  side of chest with arrow pointing upward.  Rub patch adhesive wings for 2 minutes. Remove white label marked "1". Remove the white  label marked "2". Rub patch adhesive wings for 2 additional minutes.  While looking in a mirror, press and release button in center of patch. A small green light will  flash 3-4 times. This will be  your only indicator that the monitor has been turned on.  Do not shower for the first 24 hours. You may shower after the first 24 hours.  Press the button if you feel a symptom. You will hear a small click. Record Date, Time and  Symptom in the Patient Logbook.  When you are ready to remove the patch, follow instructions on the last 2 pages of Patient  Logbook. Stick patch monitor onto the  last page of Patient Logbook.  Place Patient Logbook in the blue and white box. Use locking tab on box and tape box closed  securely. The blue and white box has prepaid postage on it. Please place it in the mailbox as  soon as possible. Your physician should have your test results approximately 7 days after the  monitor has been mailed back to [facility].  Call [facility] at [phone] if you have questions regarding  your ZIO XT patch monitor. Call them immediately if you see an orange light blinking on your  monitor.  If your monitor falls off in less than 4 days, contact our Monitor department at [phone].  If your monitor becomes loose or falls off after 4 days call [employer] at [phone] for  suggestions on securing your monitor    Follow-Up: At [facility], you and your health needs are our priority.  As part of our continuing mission to provide you with exceptional heart care, we have created designated Provider Care Teams.  These Care Teams include your primary Cardiologist (physician) and Advanced Practice Providers (APPs -  Physician Assistants and Nurse Practitioners) who all work together to provide you with the care you need, when you need it.  We recommend signing up for the patient portal called "MyChart".  Sign up information is provided on this After Visit Summary.  MyChart is used to connect with patients for Virtual Visits (Telemedicine).  Patients are able to view lab/test results, encounter notes, upcoming appointments, etc.  Non-urgent messages can be sent to your provider as well.   To learn more about what you can do with MyChart, go to [email or web address].    Your next appointment:   1 month(s)  The format for your next appointment:   In Person  Provider:   Freada Bergeron, MD    Important Information About Sugar

## 2022-03-11 ENCOUNTER — Ambulatory Visit (INDEPENDENT_AMBULATORY_CARE_PROVIDER_SITE_OTHER): Payer: Medicare Other | Admitting: Family Medicine

## 2022-03-11 VITALS — BP 114/72 | HR 75 | Ht 64.0 in | Wt 163.0 lb

## 2022-03-11 DIAGNOSIS — M7061 Trochanteric bursitis, right hip: Secondary | ICD-10-CM | POA: Diagnosis not present

## 2022-03-11 DIAGNOSIS — M25562 Pain in left knee: Secondary | ICD-10-CM | POA: Diagnosis not present

## 2022-03-11 DIAGNOSIS — M7062 Trochanteric bursitis, left hip: Secondary | ICD-10-CM

## 2022-03-11 DIAGNOSIS — G8929 Other chronic pain: Secondary | ICD-10-CM

## 2022-03-11 DIAGNOSIS — M170 Bilateral primary osteoarthritis of knee: Secondary | ICD-10-CM | POA: Diagnosis not present

## 2022-03-11 DIAGNOSIS — M25561 Pain in right knee: Secondary | ICD-10-CM | POA: Diagnosis not present

## 2022-03-11 MED ORDER — SODIUM HYALURONATE 60 MG/3ML IX PRSY
120.0000 mg | PREFILLED_SYRINGE | Freq: Once | INTRA_ARTICULAR | Status: AC
  Administered 2022-03-11: 120 mg via INTRA_ARTICULAR

## 2022-03-11 NOTE — Assessment & Plan Note (Signed)
Bilateral injections given today.  Chronic problem with exacerbation.  Has responded well to injections previously and hopeful that this will make another significant improvement for 3 to 6 months.  Discussed icing regimen.  Monitor blood sugars closely for the next 3 days.  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  Follow-up again in 6 to 8 weeks

## 2022-03-11 NOTE — Assessment & Plan Note (Signed)
Bilateral injection given today visco and should do well also had GT injections Chronic problem with exacerbation, wants to avoid surgery  RTC in 3 months

## 2022-03-11 NOTE — Patient Instructions (Signed)
Injected gel in both knees today Injected steroid in both Gts See me again in 3 months

## 2022-03-11 NOTE — Addendum Note (Signed)
Addended by: Drue Novel I on: 03/11/2022 07:01 PM   Modules accepted: Orders

## 2022-03-13 ENCOUNTER — Telehealth (HOSPITAL_COMMUNITY): Payer: Self-pay | Admitting: *Deleted

## 2022-03-13 NOTE — Telephone Encounter (Signed)
Left message on voicemail per DPR in reference to upcoming appointment scheduled on  03/20/22 with detailed instructions given per Myocardial Perfusion Study Information Sheet for the test. LM to arrive 15 minutes early, and that it is imperative to arrive on time for appointment to keep from having the test rescheduled. If you need to cancel or reschedule your appointment, please call the office within 24 hours of your appointment. Failure to do so may result in a cancellation of your appointment, and a $50 no show fee. Phone number given for call back for any questions. Kirstie Peri

## 2022-03-20 ENCOUNTER — Ambulatory Visit (HOSPITAL_BASED_OUTPATIENT_CLINIC_OR_DEPARTMENT_OTHER): Payer: Medicare Other

## 2022-03-20 ENCOUNTER — Ambulatory Visit (HOSPITAL_COMMUNITY): Payer: Medicare Other | Attending: Nurse Practitioner

## 2022-03-20 DIAGNOSIS — R0602 Shortness of breath: Secondary | ICD-10-CM

## 2022-03-20 LAB — MYOCARDIAL PERFUSION IMAGING
LV dias vol: 80 mL (ref 46–106)
LV sys vol: 45 mL
Nuc Stress EF: 44 %
Peak HR: 109 {beats}/min
Rest HR: 75 {beats}/min
Rest Nuclear Isotope Dose: 10.5 mCi
SDS: 3
SRS: 1
SSS: 4
ST Depression (mm): 0 mm
Stress Nuclear Isotope Dose: 32.4 mCi
TID: 1

## 2022-03-20 LAB — ECHOCARDIOGRAM COMPLETE
Area-P 1/2: 3.31 cm2
Height: 64 in
S' Lateral: 3.6 cm
Weight: 2608 oz

## 2022-03-20 MED ORDER — REGADENOSON 0.4 MG/5ML IV SOLN
0.4000 mg | Freq: Once | INTRAVENOUS | Status: AC
  Administered 2022-03-20: 0.4 mg via INTRAVENOUS

## 2022-03-20 MED ORDER — TECHNETIUM TC 99M TETROFOSMIN IV KIT
10.5000 | PACK | Freq: Once | INTRAVENOUS | Status: AC | PRN
  Administered 2022-03-20: 10.5 via INTRAVENOUS

## 2022-03-20 MED ORDER — TECHNETIUM TC 99M TETROFOSMIN IV KIT
32.4000 | PACK | Freq: Once | INTRAVENOUS | Status: AC | PRN
  Administered 2022-03-20: 32.4 via INTRAVENOUS

## 2022-03-23 DIAGNOSIS — E1142 Type 2 diabetes mellitus with diabetic polyneuropathy: Secondary | ICD-10-CM | POA: Diagnosis not present

## 2022-03-23 DIAGNOSIS — L603 Nail dystrophy: Secondary | ICD-10-CM | POA: Diagnosis not present

## 2022-03-23 DIAGNOSIS — M2012 Hallux valgus (acquired), left foot: Secondary | ICD-10-CM | POA: Diagnosis not present

## 2022-03-23 DIAGNOSIS — E1151 Type 2 diabetes mellitus with diabetic peripheral angiopathy without gangrene: Secondary | ICD-10-CM | POA: Diagnosis not present

## 2022-03-23 DIAGNOSIS — M2011 Hallux valgus (acquired), right foot: Secondary | ICD-10-CM | POA: Diagnosis not present

## 2022-03-23 DIAGNOSIS — M7752 Other enthesopathy of left foot: Secondary | ICD-10-CM | POA: Diagnosis not present

## 2022-03-23 DIAGNOSIS — M7751 Other enthesopathy of right foot: Secondary | ICD-10-CM | POA: Diagnosis not present

## 2022-03-23 DIAGNOSIS — I739 Peripheral vascular disease, unspecified: Secondary | ICD-10-CM | POA: Diagnosis not present

## 2022-03-26 ENCOUNTER — Ambulatory Visit: Payer: Medicare Other | Admitting: Cardiology

## 2022-03-26 NOTE — Congregational Nurse Program (Signed)
Attended [employer] [place], all dressed up, looked beautiful.  Member had fallen, no problems except some soreness.   Vinnie Langton, RN  Congregational Nurse  [phone]

## 2022-03-29 NOTE — Congregational Nurse Program (Signed)
Still having difficulty with energy level.  Walking with cane today.  Will followup    Vinnie Langton, RN

## 2022-03-30 ENCOUNTER — Telehealth: Payer: Self-pay | Admitting: Cardiology

## 2022-03-30 NOTE — Telephone Encounter (Signed)
Dr. Johney Frame, pt had an echo done on 9/22.  Ambrose Pancoast NP ordered the echo and resulted it.  Pt is very anxious about the results.   Can you please review and advise on her echo report as well?  I can call her with your suggestions thereafter.  Thanks!

## 2022-03-30 NOTE — Telephone Encounter (Signed)
Patient is following up regading recent echo results. She states she is very anxious and concerned with leaking in left heart valve--she has not been able to sleep at all. Patient is requesting a call back to discuss further/be worked in for sooner appointment with Dr. Johney Frame if necessary.

## 2022-03-30 NOTE — Telephone Encounter (Signed)
Dr. Johney Frame spoke with the pt on the phone about the echo report.  Pt was very reassured after that conversation and was gracious for the call back.

## 2022-03-31 ENCOUNTER — Other Ambulatory Visit: Payer: Self-pay | Admitting: Family

## 2022-03-31 ENCOUNTER — Ambulatory Visit
Admission: RE | Admit: 2022-03-31 | Discharge: 2022-03-31 | Disposition: A | Discharge status: Home / Self Care | Payer: Medicare Other | Source: Ambulatory Visit | Attending: Family | Admitting: Family

## 2022-03-31 DIAGNOSIS — M810 Age-related osteoporosis without current pathological fracture: Secondary | ICD-10-CM | POA: Diagnosis not present

## 2022-03-31 DIAGNOSIS — Z780 Asymptomatic menopausal state: Secondary | ICD-10-CM

## 2022-03-31 MED ORDER — ALENDRONATE SODIUM 70 MG PO TABS: 70.0000 mg | ORAL_TABLET | ORAL | 11 refills | Status: DC

## 2022-04-01 ENCOUNTER — Telehealth: Payer: Self-pay

## 2022-04-01 ENCOUNTER — Telehealth: Payer: Self-pay | Admitting: Family

## 2022-04-01 NOTE — Telephone Encounter (Signed)
Patient called wanting to go over her Bone density scan again. She would like a call back. Please advise.

## 2022-04-01 NOTE — Telephone Encounter (Signed)
[place], [place]  03/31/2022  4:20 PM EDT     Patient called.  Patient aware of the results and she stated that she doesn't need another option since she trusts Mickel Baas. Please assist in sending something that will help with her bone loss.    [name], [place]  03/31/2022  9:37 AM EDT     There has been further bone loss compared to DEXA in 2020- she is now considered osteoporotic. Is she willing to consider taking a medication to treat this? She could also discuss with her endocrinologist if she wants to get second opinion.

## 2022-04-01 NOTE — Telephone Encounter (Signed)
Nurse Assessment Nurse: Marlon Pel, RN, [place] Date/Time (Eastern Time): 03/31/2022 9:22:36 PM Confirm and document reason for call. If symptomatic, describe symptoms. ---Caller states she has knee pain that is making difficulty to walk and get it in and out of the car. Caller states she gets injections to knee, last dose was last week. Caller states left hurts more than right. states she had a bone density scan today and told she has arthritis. Does the patient have any new or worsening symptoms? ---Yes Will a triage be completed? ---Yes Related visit to physician within the last 2 weeks? ---Yes Does the PT have any chronic conditions? (i.e. diabetes, asthma, this includes High risk factors for pregnancy, etc.) ---Yes List chronic conditions. ---DM, cardiac Is this a behavioral health or substance abuse call? ---No Guidelines Guideline Title Affirmed Question Affirmed Notes Nurse Date/Time (Eastern Time) Knee Pain [1] SEVERE pain (e.g., excruciating, unable to walk) AND [2] not improved after 2 hours of pain medicine Marlon Pel, RN, [place] 03/31/2022 9:25:57 PM PLEASE NOTE: All timestamps contained within this report are represented as [place] Standard Time. CONFIDENTIALTY NOTICE: This fax transmission is intended only for the addressee. It contains information that is legally privileged, confidential or otherwise protected from use or disclosure. If you are not the intended recipient, you are strictly prohibited from reviewing, disclosing, copying using or disseminating any of this information or taking any action in reliance on or regarding this information. If you have received this fax in error, please notify us immediately by telephone so that we can arrange for its return to [place]. Phone: [phone], Toll-Free: [phone], Fax: [phone] Page: 2 of 2 Call Id: [id number] [place]. Time Eilene Ghazi Time) Disposition Final User 03/31/2022 9:27:40 PM See HCP within 4  Hours (or PCP triage) Yes Marlon Pel, [place], [place] Final Disposition 03/31/2022 9:27:40 PM See HCP within 4 Hours (or PCP triage) Yes Marlon Pel, RN, Beatrix Shipper Disagree/Comply Disagree Caller Understands Yes PreDisposition Call Doctor Care Advice Given Per Guideline SEE HCP (OR PCP TRIAGE) WITHIN 4 HOURS: * UCC: Some UCCs can manage patients who are stable and have less serious symptoms (e.g., minor illnesses and injuries). The triager must know the [facility] capabilities before sending a patient there. If unsure, call ahead. CALL BACK IF: * You become worse CARE ADVICE given per Knee Pain (Adult) guideline. Comments User: Macy Mis, RN Date/Time Eilene Ghazi Time): 03/31/2022 9:26:43 PM Caller rates pain 7/10 User: Macy Mis, RN Date/Time Eilene Ghazi Time): 03/31/2022 9:30:00 PM Caller states she would like results f bone density scan and to know what type of arthritis she has. Caller states she wont go to UC tonight but will call office tomorrow. Referrals GO TO FACILITY REFUSED

## 2022-04-02 ENCOUNTER — Telehealth: Payer: Self-pay

## 2022-04-02 NOTE — Telephone Encounter (Signed)
Patient advised and verbalized understanding 

## 2022-04-02 NOTE — Telephone Encounter (Signed)
I have called the pt back and she stated that she got her questions answered and she is good to go. She will call back the provider that helped her with his knee. I stated understanding.

## 2022-04-02 NOTE — Telephone Encounter (Signed)
Patient was advise by her pcp to call and make sure it's okay to take the Fosamax with her diabetes medications.

## 2022-04-02 NOTE — Telephone Encounter (Signed)
Rx has been sent in and pt had all her questions answered.

## 2022-04-04 LAB — GLUCOSE, POCT (MANUAL RESULT ENTRY): POC Glucose: 112 mg/dl — AB (ref 70–99)

## 2022-04-14 ENCOUNTER — Ambulatory Visit (INDEPENDENT_AMBULATORY_CARE_PROVIDER_SITE_OTHER): Payer: Medicare Other | Admitting: Sports Medicine

## 2022-04-14 VITALS — BP 122/78 | HR 83 | Ht 64.0 in | Wt 163.0 lb

## 2022-04-14 DIAGNOSIS — M170 Bilateral primary osteoarthritis of knee: Secondary | ICD-10-CM

## 2022-04-14 DIAGNOSIS — G8929 Other chronic pain: Secondary | ICD-10-CM | POA: Diagnosis not present

## 2022-04-14 DIAGNOSIS — M25562 Pain in left knee: Secondary | ICD-10-CM | POA: Diagnosis not present

## 2022-04-14 DIAGNOSIS — M2352 Chronic instability of knee, left knee: Secondary | ICD-10-CM

## 2022-04-14 DIAGNOSIS — M25561 Pain in right knee: Secondary | ICD-10-CM | POA: Diagnosis not present

## 2022-04-14 NOTE — Patient Instructions (Addendum)
Good to see you MRI bilat knees Follow up 3 days after to discuss MRI results

## 2022-04-14 NOTE — Progress Notes (Signed)
Benito Mccreedy D.[place] Phone: [phone]   Assessment and Plan:     1. Chronic pain of both knees 2. Primary osteoarthritis of both knees 3. Recurrent left knee instability -Chronic with exacerbation, subsequent visit - Consistent of flare of osteoarthritis in bilateral knees, however patient is not responding to treatments including little to no improvement after CSI to bilateral knees on 02/27/2022 and HA injection to bilateral knees on 03/11/2022 - Due to failure to improve with >6 weeks of conservative therapy, arthritic changes seen on x-ray, pain frequently >6/10, pain affecting day-to-day activities, instability primarily of left knee, I feel that it is important to proceed with MRIs of bilateral knees to identify patient's underlying diagnosis - Due to instability, patient prefers to use bilateral knee braces.  Provided in office visit today - Recommend continued use of Tylenol for day-to-day pain relief - MR Knee Left  Wo Contrast; Future - MR Knee Right Wo Contrast; Future    Pertinent previous records reviewed include none   Follow Up: 3 days after MRI to review results and discuss treatment plan.  Patient hopes to avoid surgery   Subjective:   I, Pincus Badder, am serving as a Education administrator for Doctor Glennon Mac   Chief Complaint: bilateral knee injection    HPI:  09/30/2021 Patient given injection today and tolerated the procedure well, discussed icing regimen and home exercise, discussed which activities to do which wants to avoid.  Discussed which activities to potentially avoid.  Patient does not want to do the home exercises on a regular basis at the moment.  Follow-up with me again 6 to 8 weeks.  Can repeat every 3 months if needed   Chronic, with exacerbation.  Has had this difficulty for some time.  Discussed the possibility of a lumbar radiculopathy with radicular symptoms.  Discussed  which activities to do which ones to avoid.  Follow-up again in 3 months   Update 12/09/2021 Evelene Roussin is a 76 y.o. female coming in with complaint of B hip and B knee pain. Saw Dr. Glennon Mac twice in May 2023 for shoulder, elbow, and wrist pain after a fall. Worsening pain in the knees  Patient states still sore from fall. Time for injections in knees and wondering about GT injections because she has to see a kidney doctor.     Patient when seen another provider was found to have a possible lung nodule.  Was sent for a CT chest with contrast that did not show any significant nodule noted on CT scan but was found to have significant right hydronephrosis.  And then was sent for CT abdomen pelvis which did show that there was a significant right-sided hydronephrosis with possible stricture and no sign of true stone formation.   Patient also GFR has been decreased with most recent labs of 59.8   02/27/2022 Patient states wants knee and injection and hip injections   03/11/2022 Siah Kannan Brundidge is a 76 y.o. female coming in with complaint of B knee pain. Injected by Dr. Glennon Mac on 02/27/2022. Durolane approved. Patient states that she is would like injections in backside and gel injections.   04/14/2022 Patient states she is in a lot of pain  , her left knee goes out and her right knee hurts a lot   Relevant Historical Information: DM type II, hypertension  Additional pertinent review of systems negative.   Current Outpatient Medications:    alendronate (  FOSAMAX) 70 MG tablet, Take 1 tablet (70 mg total) by mouth every 7 (seven) days. Take with a full glass of water on an empty stomach., Disp: 4 tablet, Rfl: 11   AMBULATORY NON FORMULARY MEDICATION, Left wrist brace, Disp: 1 Piece, Rfl: 0   aspirin EC 81 MG tablet, Take 81 mg by mouth daily. Swallow whole., Disp: , Rfl:    Blood Glucose Monitoring Suppl (ONETOUCH VERIO FLEX SYSTEM) w/Device KIT, USE TO TEST BLOOD SUGAR DAILY AS DIRECTED, Disp:  1 kit, Rfl: 0   calcium elemental as carbonate (ANTACID MAXIMUM) 400 MG chewable tablet, Chew 1,000 mg by mouth as needed for heartburn., Disp: , Rfl:    carvedilol (COREG) 12.5 MG tablet, TAKE 1 TABLET BY MOUTH TWO TIMES A DAY WITH A MEAL, Disp: 180 tablet, Rfl: 2   cephALEXin (KEFLEX) 250 MG capsule, Take 250 mg by mouth daily., Disp: , Rfl:    cholecalciferol (VITAMIN D3) 25 MCG (1000 UNIT) tablet, Take 1,000 Units by mouth daily., Disp: , Rfl:    esomeprazole (NEXIUM) 40 MG capsule, TAKE ONE CAPSULE BY MOUTH TWICE A DAY 30 TO 60 MINUTES BEFORE YOUR FIRST AND LAST MEALS OF THE DAY *NEED APPOINTMENT FOR FURTHER REFILLS*, Disp: 180 capsule, Rfl: 0   famotidine (PEPCID) 20 MG tablet, TAKE ONE TABLET BY MOUTH EVERY NIGHT AT BEDTIME, Disp: 90 tablet, Rfl: 0   fesoterodine (TOVIAZ) 4 MG TB24 tablet, Take 4 mg daily by mouth., Disp: , Rfl:    gabapentin (NEURONTIN) 300 MG capsule, Take 1 capsule (300 mg total) by mouth at bedtime., Disp: 90 capsule, Rfl: 3   glimepiride (AMARYL) 1 MG tablet, TAKE ONE TABLET BY MOUTH DAILY WITH BREAKFAST, Disp: 30 tablet, Rfl: 2   glucose blood (ONETOUCH VERIO) test strip, USE TO CHECK BLOOD SUGAE TWO TIMES A DAY, Disp: 100 strip, Rfl: 3   guaiFENesin (MUCINEX) 600 MG 12 hr tablet, Take 600 mg by mouth 2 (two) times daily as needed., Disp: , Rfl:    montelukast (SINGULAIR) 10 MG tablet, Take 1 tablet by mouth daily., Disp: , Rfl:    Respiratory Therapy Supplies MISC, Use flutter valve as needed to break the coughing cycle., Disp: 1 each, Rfl: 1   rosuvastatin (CRESTOR) 20 MG tablet, Take 1 tablet (20 mg total) by mouth daily., Disp: 90 tablet, Rfl: 3   valsartan (DIOVAN) 160 MG tablet, Take 1 tablet (160 mg total) by mouth daily., Disp: 90 tablet, Rfl: 2   Objective:     Vitals:   04/14/22 1426  BP: 122/78  Pulse: 83  SpO2: 98%  Weight: 163 lb (73.9 kg)  Height: '5\' 4"'  (1.626 m)      Body mass index is 27.98 kg/m.    Physical Exam:    nontoxic Skin: no  suspicious lesions or rashes Neuro:sensation intact, no deficits, strength 5/5 with no deficits, no atrophy, normal muscle tone Psych: No signs of anxiety, depression or other mood disorder   Bilateral knee: mild swelling No deformity Neg fluid wave, joint milking ROM Flex 100, Ext 5 TTP medial lateral joint line NTTP over the quad tendon, medial fem condyle, lat fem condyle, patella, plica, patella tendon, tibial tuberostiy, fibular head, posterior fossa, pes anserine bursa, gerdy's tubercle, medial jt line, lateral jt line [place] causes pain bilaterally, but no palpable pop Negative anterior posterior drawer   Gait slow with short steps.  Using a 4 pronged cane for support   Electronically signed by:  Benito Mccreedy D.[facility] 3:09 PM 04/14/22

## 2022-04-19 NOTE — Progress Notes (Unsigned)
Cardiology Office Note:    Date:  04/19/2022   ID:  Escobar, Amy 1946/06/14, MRN [id number]  PCP:  [name], [place] [facility] Cardiologist:  Freada Bergeron, MD {    Referring MD: [name],*    History of Present Illness:    Amy Escobar is a 76 y.o. female with a hx of hypertension, hyperlipidemia, insulin-dependent diabetes, moderate pulmonary hypertension, moderate nonobstructive coronary disease who previously followed with Dr. Meda Coffee who now returns to clinic for follow-up.  Per review of the record,coronary CTA in 2018 with calcium score of 220 placing her in the 81st percentile for age and sex matched control.  There was moderate plaque in the ostial left main and mild plaque in the ostial RCA. She was seen August 2020 by Dr. Meda Coffee noting dyspnea on exertion and chest pain. TTE 03/10/2019 normal LVEF 55 to 60%, borderline enlarged pulmonary artery, normal RVSP. Myoview 02/2019 normal.  Was last seen in clinic by Ambrose Pancoast, NP where she was having some dyspnea on exertion as well as palpitations. TTE 02/2022 with EF 55-60%, GLS -20.7%, normal RV, mild MR. Myoview 02/2022 with no ischemia or infarction.   Today, ***  Past Medical History:  Diagnosis Date   Alkaline phosphatase deficiency    w/u Ne   Allergic rhinitis    Allergy    Anxiety    Arthritis    Cataract    BILATERAL-REMOVED   DM2 (diabetes mellitus, type 2) (HCC)    GERD (gastroesophageal reflux disease)    Gout    Hemorrhoids    Hyperlipidemia    Hypertension    Mild pulmonary hypertension (HCC)    NSVT (nonsustained ventricular tachycardia) (HCC)    Osteoporosis    PVC's (premature ventricular contractions)    Thyroid nodule    small   Tubular adenoma of colon 2017   Urticaria    Uterine fibroid     Past Surgical History:  Procedure Laterality Date   CATARACT EXTRACTION Bilateral 2016   COLONOSCOPY  2007   echocardiogram (other)   01/16/2002   POLYPECTOMY     removed tumors from foot nerves  04/1999   stress cardiolite  02/12/2006   TOENAIL EXCISION     TUBAL LIGATION     TYMPANOSTOMY TUBE PLACEMENT      Current Medications: No outpatient medications have been marked as taking for the 04/21/22 encounter (Appointment) with Freada Bergeron, MD.     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    Social Determinants of  Health   Financial Resource Strain: Low Risk  (10/07/2021)   Overall Financial Resource Strain (CARDIA)    Difficulty of Paying Living Expenses: Not hard at all  Food Insecurity: No Food Insecurity (10/07/2021)   Hunger Vital Sign    Worried About Running Out of Food in the Last Year: Never true    Ran Out of Food in the Last Year: Never true  Transportation Needs: No Transportation Needs (10/07/2021)   PRAPARE - Hydrologist (Medical): No    Lack of Transportation (Non-Medical): No  Physical Activity: Inactive (10/07/2021)   Exercise Vital Sign    Days of Exercise per Week: 0 days    Minutes of Exercise per Session: 0 min  Stress: No Stress Concern Present (10/07/2021)   [place]    Feeling of Stress : Not at all  Social Connections: Moderately Integrated (10/07/2021)   Social Connection and Isolation Panel [NHANES]    Frequency of Communication with Friends and Family: More than three times a week    Frequency of Social Gatherings with Friends and Family: More than three times a week    Attends Religious Services: More than 4 times per year    Active Member of [employer] or Organizations: Yes    Attends Archivist Meetings: More than 4 times per year    Marital Status: Widowed     Family History: The patient's family history includes Allergic rhinitis in her mother; Heart disease in her father, maternal grandfather, and mother; Rectal cancer in her maternal grandfather; Stomach cancer in her maternal grandmother. There is no history of Colon cancer, Breast cancer, Colon polyps, or Esophageal cancer.  ROS:   Please see the history of present illness.    Review of Systems  Constitutional:  Negative for fever and malaise/fatigue.  HENT:  Negative for congestion and sore throat.   Eyes:  Negative for blurred vision.  Respiratory:  Positive for shortness of breath (exertional). Negative for cough.   Cardiovascular:  Positive for chest pain (Upper L chest). Negative for palpitations, orthopnea, claudication, leg swelling and PND.  Gastrointestinal:  Negative for heartburn and nausea.  Genitourinary:  Negative for dysuria and urgency.  Musculoskeletal:  Negative for joint pain and myalgias.  Skin:  Negative for itching and rash.  Neurological:  Negative for dizziness and headaches.  Endo/Heme/Allergies:  Does not bruise/bleed easily.  Psychiatric/Behavioral:  The patient is not nervous/anxious and does not have insomnia.    All other systems reviewed and are negative.  EKGs/Labs/Other Studies Reviewed:    The following studies were reviewed today: TTE Mar 26, 2022: IMPRESSIONS     1. Left ventricular ejection fraction, by estimation, is 55 to  60%. The  left ventricle has normal function. The left ventricle has no regional  wall motion abnormalities. Left ventricular diastolic parameters are  indeterminate. The average left  ventricular global longitudinal strain is -20.7 %. The global longitudinal  strain is normal.   2. Right ventricular systolic function is normal. The right ventricular  size is normal. There is normal pulmonary artery systolic pressure. The  estimated right ventricular systolic pressure is 10.1 mmHg.   3. The mitral valve is normal in structure. Mild mitral valve  regurgitation. No evidence of mitral stenosis.   4. The aortic valve is tricuspid. Aortic valve regurgitation is not  visualized. No aortic stenosis is present.   5. The inferior vena cava is normal in size with  greater than 50%  respiratory variability, suggesting right atrial pressure of 3 mmHg.   Comparison(s): No significant change from prior study. 03/10/19 EF 55-60%.  PA pressure 58mHg.   Myoview 02/2022:   Findings are consistent with no prior ischemia and no prior myocardial infarction. The study is intermediate risk based on ejection fraction.   No ST deviation was noted.   LV perfusion is normal. There is no evidence of ischemia. There is no evidence of infarction.   Left ventricular function is abnormal. Global function is moderately reduced. There were no regional wall motion abnormalities. Nuclear stress EF: 44 %. The left ventricular ejection fraction is moderately decreased (30-44%). End diastolic cavity size is mildly enlarged. End systolic cavity size is mildly enlarged.   Prior study available for comparison from 03/10/2019.   Normal perfusion global hypokinesis worse in the apex estimated EF 44%   TTE 02/2019: IMPRESSIONS   1. The left ventricle has normal systolic function, with an ejection  fraction of 55-60%. The cavity size was normal. There is mild concentric  left ventricular hypertrophy. Left ventricular diastolic  Doppler  parameters are indeterminate. No evidence of  left ventricular regional wall motion abnormalities.   2. Trivial pericardial effusion is present.   3. The aortic valve is tricuspid. No stenosis of the aortic valve.   4. The aorta is normal unless otherwise noted.   5. The aortic root, ascending aorta and aortic arch are normal in size  and structure.   6. Borderline enlarged pulmonary artery.   Myoview 02/2019: Nuclear stress EF: 59%. There was no ST segment deviation noted during stress. The study is normal. This is a low risk study. The left ventricular ejection fraction is normal (55-65%).   Normal stress nuclear study with no ischemia or infarction.  Gated ejection fraction 59% with normal wall motion.  EKG: EKG was not ordered today  Recent Labs: 03/03/2022: ALT 18; BUN 16; Creatinine, Ser 0.84; Hemoglobin 13.0; Platelets 194.0; Potassium 4.1; Sodium 142  Recent Lipid Panel    Component Value Date/Time   CHOL 132 09/16/2021 1327   CHOL 152 05/13/2021 0000   TRIG 99.0 09/16/2021 1327   TRIG 48 05/03/2006 0733   HDL 41.20 09/16/2021 1327   HDL 48 05/13/2021 0000   CHOLHDL 3 09/16/2021 1327   VLDL 19.8 09/16/2021 1327   LDLCALC 71 09/16/2021 1327   LDLCALC 88 05/13/2021 0000   LDLDIRECT 165.6 06/19/2009 0731     Risk Assessment/Calculations:           Physical Exam:    VS:  There were no vitals taken for this visit.    Wt Readings from Last 3 Encounters:  04/14/22 163 lb (73.9 kg)  03/20/22 163 lb (73.9 kg)  03/11/22 163 lb (73.9 kg)     GEN: Well nourished, well developed in no acute distress HEENT: Normal NECK: No JVD; No carotid bruits CARDIAC: RRR, no murmurs, rubs, gallops RESPIRATORY:  Clear to auscultation without rales, wheezing or rhonchi ABDOMEN: Soft, non-tender, non-distended MUSCULOSKELETAL:  No edema; No deformity  SKIN: Warm and dry NEUROLOGIC:  Alert and oriented x 3 PSYCHIATRIC:  Normal affect   ASSESSMENT:    No diagnosis  found. PLAN:    In order of problems listed above:  #Nonobstructive CAD: Coronary CTA 10/2016 with 25-50% ostial RCA, 50-69% ostial LM, 25-50% prox LAD. FFR negative. Ca score 220 (81% for age, gender matched controls). Most recent myoview in 02/2022 with no ischemia or infarction. TTE 02/2022 with LVEF  55-60%, no significant valve disease.  -Continue crestor '20mg'$  daily -Continue ASA '81mg'$  daily  #HTN: Very well controlled and at goal.  -Continue coreg 12.'5mg'$  BID -Continue valsartan '160mg'$  daily  #Pulmonary HTN: TTE 02/2019 with low normal RV systolic function. No significant symptoms. Has quit smoking. -Continue to monitor  #HLD: -Continue crestor '20mg'$  daily -LDL 71 in 08/2021     Medication Adjustments/Labs and Tests Ordered: Current medicines are reviewed at length with the patient today.  Concerns regarding medicines are outlined above.  No orders of the defined types were placed in this encounter.  No orders of the defined types were placed in this encounter.   There are no Patient Instructions on file for this visit.     Signed, Freada Bergeron, MD  04/19/2022 7:56 PM    [place]

## 2022-04-20 ENCOUNTER — Ambulatory Visit (INDEPENDENT_AMBULATORY_CARE_PROVIDER_SITE_OTHER): Payer: Medicare Other

## 2022-04-20 ENCOUNTER — Encounter (INDEPENDENT_AMBULATORY_CARE_PROVIDER_SITE_OTHER): Payer: Medicare Other | Admitting: Ophthalmology

## 2022-04-20 DIAGNOSIS — M25562 Pain in left knee: Secondary | ICD-10-CM

## 2022-04-20 DIAGNOSIS — M170 Bilateral primary osteoarthritis of knee: Secondary | ICD-10-CM

## 2022-04-20 DIAGNOSIS — G8929 Other chronic pain: Secondary | ICD-10-CM | POA: Diagnosis not present

## 2022-04-20 DIAGNOSIS — M1712 Unilateral primary osteoarthritis, left knee: Secondary | ICD-10-CM | POA: Diagnosis not present

## 2022-04-20 DIAGNOSIS — M7122 Synovial cyst of popliteal space [Baker], left knee: Secondary | ICD-10-CM | POA: Diagnosis not present

## 2022-04-20 DIAGNOSIS — S83242A Other tear of medial meniscus, current injury, left knee, initial encounter: Secondary | ICD-10-CM | POA: Diagnosis not present

## 2022-04-20 DIAGNOSIS — M2352 Chronic instability of knee, left knee: Secondary | ICD-10-CM | POA: Diagnosis not present

## 2022-04-20 DIAGNOSIS — M25561 Pain in right knee: Secondary | ICD-10-CM | POA: Diagnosis not present

## 2022-04-20 DIAGNOSIS — M25462 Effusion, left knee: Secondary | ICD-10-CM | POA: Diagnosis not present

## 2022-04-21 ENCOUNTER — Encounter: Payer: Self-pay | Admitting: Cardiology

## 2022-04-21 ENCOUNTER — Ambulatory Visit: Payer: Medicare Other | Attending: Cardiology | Admitting: Cardiology

## 2022-04-21 VITALS — BP 136/72 | HR 83 | Ht 64.0 in | Wt 168.8 lb

## 2022-04-21 DIAGNOSIS — R0602 Shortness of breath: Secondary | ICD-10-CM

## 2022-04-21 DIAGNOSIS — I10 Essential (primary) hypertension: Secondary | ICD-10-CM

## 2022-04-21 DIAGNOSIS — I272 Pulmonary hypertension, unspecified: Secondary | ICD-10-CM | POA: Diagnosis not present

## 2022-04-21 DIAGNOSIS — E782 Mixed hyperlipidemia: Secondary | ICD-10-CM | POA: Diagnosis not present

## 2022-04-21 DIAGNOSIS — I25118 Atherosclerotic heart disease of native coronary artery with other forms of angina pectoris: Secondary | ICD-10-CM | POA: Diagnosis not present

## 2022-04-21 DIAGNOSIS — R002 Palpitations: Secondary | ICD-10-CM | POA: Diagnosis not present

## 2022-04-21 MED ORDER — FAMOTIDINE 20 MG PO TABS: 20.0000 mg | ORAL_TABLET | Freq: Every day | ORAL | 2 refills | Status: DC

## 2022-04-21 MED ORDER — MONTELUKAST SODIUM 10 MG PO TABS: 10.0000 mg | ORAL_TABLET | Freq: Every day | ORAL | 2 refills | Status: DC

## 2022-04-21 NOTE — Progress Notes (Signed)
Cardiology Office Note:    Date:  04/21/2022   ID:  Amy Escobar, Amy Escobar Feb 20, 1946, MRN [id number]  PCP:  [name], [facility] Cardiologist:  Freada Bergeron, MD {    Referring MD: [name],*    History of Present Illness:    Amy Escobar is a 76 y.o. female with a hx of hypertension, hyperlipidemia, insulin-dependent diabetes, moderate pulmonary hypertension, moderate nonobstructive coronary disease who previously followed with Dr. Meda Coffee who now returns to clinic for follow-up.  Per review of the record,coronary CTA in 2018 with calcium score of 220 placing her in the 81st percentile for age and sex matched control.  There was moderate plaque in the ostial left main and mild plaque in the ostial RCA. She was seen August 2020 by Dr. Meda Coffee noting dyspnea on exertion and chest pain. TTE 03/10/2019 normal LVEF 55 to 60%, borderline enlarged pulmonary artery, normal RVSP. Myoview 02/2019 normal.  Was last seen in clinic by Ambrose Pancoast, NP where she was having some dyspnea on exertion as well as palpitations. TTE 02/2022 with EF 55-60%, GLS -20.7%, normal RV, mild MR. Myoview 02/2022 with no ischemia or infarction.   Today, she reports that she is feeling well overall. She did have a fall in May and has been having some difficulty with her legs, especially her knees since then. She had an MRI yesterday but does not have the results yet. She should have the results on Thursday. She has been able to walk with her cane, she does have a walker that she has been using at home.   She has been doing well from a CV standpoint. Has occasional chest discomfort that is nonexertional and improved with pepcid. No palpitations, LE edema, orthopnea or PND. SOB is improved.   Past Medical History:  Diagnosis Date   Alkaline phosphatase deficiency    w/u Ne   Allergic rhinitis    Allergy    Anxiety    Arthritis    Cataract    BILATERAL-REMOVED    DM2 (diabetes mellitus, type 2) (HCC)    GERD (gastroesophageal reflux disease)    Gout    Hemorrhoids    Hyperlipidemia    Hypertension    Mild pulmonary hypertension (HCC)    NSVT (nonsustained ventricular tachycardia) (HCC)    Osteoporosis    PVC's (premature ventricular contractions)    Thyroid nodule    small   Tubular adenoma of colon 2017   Urticaria    Uterine fibroid     Past Surgical History:  Procedure Laterality Date   CATARACT EXTRACTION Bilateral 2016   COLONOSCOPY  2007   echocardiogram (other)  01/16/2002   POLYPECTOMY     removed tumors from foot nerves  04/1999   stress cardiolite  02/12/2006   TOENAIL EXCISION     TUBAL LIGATION     TYMPANOSTOMY TUBE PLACEMENT      Current Medications: Current Meds  Medication Sig   alendronate (FOSAMAX) 70 MG tablet Take 1 tablet (70 mg total) by mouth every 7 (seven) days. Take with a full glass of water on an empty stomach.   AMBULATORY NON FORMULARY MEDICATION Left wrist brace   aspirin EC 81 MG tablet Take 81 mg by mouth daily. Swallow whole.   Blood Glucose Monitoring Suppl (ONETOUCH VERIO FLEX SYSTEM) w/Device KIT USE TO TEST BLOOD SUGAR DAILY AS DIRECTED   calcium elemental as carbonate (ANTACID MAXIMUM) 400 MG chewable tablet Chew 1,000  mg by mouth as needed for heartburn.   carvedilol (COREG) 12.5 MG tablet TAKE 1 TABLET BY MOUTH TWO TIMES A DAY WITH A MEAL   cephALEXin (KEFLEX) 250 MG capsule Take 250 mg by mouth daily.   cholecalciferol (VITAMIN D3) 25 MCG (1000 UNIT) tablet Take 1,000 Units by mouth daily.   esomeprazole (NEXIUM) 40 MG capsule TAKE ONE CAPSULE BY MOUTH TWICE A DAY 30 TO 60 MINUTES BEFORE YOUR FIRST AND LAST MEALS OF THE DAY *NEED APPOINTMENT FOR FURTHER REFILLS*   fesoterodine (TOVIAZ) 4 MG TB24 tablet Take 4 mg daily by mouth.   gabapentin (NEURONTIN) 300 MG capsule Take 1 capsule (300 mg total) by mouth at bedtime.   glimepiride (AMARYL) 1 MG tablet TAKE ONE TABLET BY MOUTH DAILY WITH  BREAKFAST   glucose blood (ONETOUCH VERIO) test strip USE TO CHECK BLOOD SUGAE TWO TIMES A DAY   guaiFENesin (MUCINEX) 600 MG 12 hr tablet Take 600 mg by mouth 2 (two) times daily as needed.   Respiratory Therapy Supplies MISC Use flutter valve as needed to break the coughing cycle.   valsartan (DIOVAN) 160 MG tablet Take 1 tablet (160 mg total) by mouth daily.   [DISCONTINUED] famotidine (PEPCID) 20 MG tablet TAKE ONE TABLET BY MOUTH EVERY NIGHT AT BEDTIME   [DISCONTINUED] montelukast (SINGULAIR) 10 MG tablet Take 1 tablet by mouth daily.     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    Social Determinants of Health   Financial Resource Strain: Low Risk  (10/07/2021)   Overall Financial Resource Strain (CARDIA)    Difficulty of Paying Living Expenses: Not hard at all  Food Insecurity: No Food Insecurity (10/07/2021)   Hunger Vital Sign    Worried About Running Out of Food in the Last Year: Never true    Ran Out of Food in the Last Year: Never true  Transportation Needs: No Transportation Needs (10/07/2021)   PRAPARE - Hydrologist (Medical): No    Lack of Transportation (Non-Medical): No  Physical Activity: Inactive (10/07/2021)    Exercise Vital Sign    Days of Exercise per Week: 0 days    Minutes of Exercise per Session: 0 min  Stress: No Stress Concern Present (10/07/2021)   Thor    Feeling of Stress : Not at all  Social Connections: Moderately Integrated (10/07/2021)   Social Connection and Isolation Panel [NHANES]    Frequency of Communication with Friends and Family: More than three times a week  Frequency of Social Gatherings with Friends and Family: More than three times a week    Attends Religious Services: More than 4 times per year    Active Member of Clubs or Organizations: Yes    Attends Archivist Meetings: More than 4 times per year    Marital Status: Widowed     Family History: The patient's family history includes Allergic rhinitis in her mother; Heart disease in her father, maternal grandfather, and mother; Rectal cancer in her maternal grandfather; Stomach cancer in her maternal grandmother. There is no history of Colon cancer, Breast cancer, Colon polyps, or Esophageal cancer.  ROS:   Please see the history of present illness.    Review of Systems  Constitutional:  Negative for fever and malaise/fatigue.  HENT:  Negative for congestion and sore throat.   Eyes:  Negative for blurred vision, pain and discharge.  Respiratory:  Negative for cough and shortness of breath (exertional).   Cardiovascular:  Positive for chest pain (Upper L chest) and leg swelling (right leg, intermittent). Negative for palpitations, orthopnea, claudication and PND.  Gastrointestinal:  Negative for heartburn and nausea.  Genitourinary:  Negative for dysuria and urgency.  Musculoskeletal:  Positive for joint pain (knees). Negative for myalgias.  Skin:  Negative for itching and rash.  Neurological:  Negative for dizziness and headaches.  Endo/Heme/Allergies:  Does not bruise/bleed easily.  Psychiatric/Behavioral:  The patient is not  nervous/anxious and does not have insomnia.    All other systems reviewed and are negative.  EKGs/Labs/Other Studies Reviewed:    The following studies were reviewed today: TTE 04/05/2022: IMPRESSIONS     1. Left ventricular ejection fraction, by estimation, is 55 to 60%. The  left ventricle has normal function. The left ventricle has no regional  wall motion abnormalities. Left ventricular diastolic parameters are  indeterminate. The average left  ventricular global longitudinal strain is -20.7 %. The global longitudinal  strain is normal.   2. Right ventricular systolic function is normal. The right ventricular  size is normal. There is normal pulmonary artery systolic pressure. The  estimated right ventricular systolic pressure is 37.1 mmHg.   3. The mitral valve is normal in structure. Mild mitral valve  regurgitation. No evidence of mitral stenosis.   4. The aortic valve is tricuspid. Aortic valve regurgitation is not  visualized. No aortic stenosis is present.   5. The inferior vena cava is normal in size with greater than 50%  respiratory variability, suggesting right atrial pressure of 3 mmHg.   Comparison(s): No significant change from prior study. 2019-04-06 EF 55-60%.  PA pressure 40mHg.   Myoview 010/01/2022   Findings are consistent with no prior ischemia and no prior myocardial infarction. The study is intermediate risk based on ejection fraction.   No ST deviation was noted.   LV perfusion is normal. There is no evidence of ischemia. There is no evidence of infarction.   Left ventricular function is abnormal. Global function is moderately reduced. There were no regional wall motion abnormalities. Nuclear stress EF: 44 %. The left ventricular ejection fraction is moderately decreased (30-44%). End diastolic cavity size is mildly enlarged. End systolic cavity size is mildly enlarged.   Prior study available for comparison from 92020/10/08   Normal perfusion global hypokinesis  worse in the apex estimated EF 44%   TTE 010-01-2019 IMPRESSIONS   1. The left ventricle has normal systolic function, with an ejection  fraction of 55-60%. The cavity size was normal. There is mild  concentric  left ventricular hypertrophy. Left ventricular diastolic Doppler  parameters are indeterminate. No evidence of  left ventricular regional wall motion abnormalities.   2. Trivial pericardial effusion is present.   3. The aortic valve is tricuspid. No stenosis of the aortic valve.   4. The aorta is normal unless otherwise noted.   5. The aortic root, ascending aorta and aortic arch are normal in size  and structure.   6. Borderline enlarged pulmonary artery.   Myoview 02/2019: Nuclear stress EF: 59%. There was no ST segment deviation noted during stress. The study is normal. This is a low risk study. The left ventricular ejection fraction is normal (55-65%).   Normal stress nuclear study with no ischemia or infarction.  Gated ejection fraction 59% with normal wall motion.  EKG:EKG is personally reviewed.  04/20/2022: EKG was not ordered.  09/16/2021: EKG was not ordered today  Recent Labs: 03/03/2022: ALT 18; BUN 16; Creatinine, Ser 0.84; Hemoglobin 13.0; Platelets 194.0; Potassium 4.1; Sodium 142  Recent Lipid Panel    Component Value Date/Time   CHOL 132 09/16/2021 1327   CHOL 152 05/13/2021 0000   TRIG 99.0 09/16/2021 1327   TRIG 48 05/03/2006 0733   HDL 41.20 09/16/2021 1327   HDL 48 05/13/2021 0000   CHOLHDL 3 09/16/2021 1327   VLDL 19.8 09/16/2021 1327   LDLCALC 71 09/16/2021 1327   LDLCALC 88 05/13/2021 0000   LDLDIRECT 165.6 06/19/2009 0731     Risk Assessment/Calculations:           Physical Exam:    VS:  BP 136/72   Pulse 83   Ht _0  (1.626 m)   Wt 168 lb 12.8 oz (76.6 kg)   SpO2 96%   BMI 28.97 kg/m     Wt Readings from Last 3 Encounters:  04/21/22 168 lb 12.8 oz (76.6 kg)  04/14/22 163 lb (73.9 kg)  03/20/22 163 lb (73.9 kg)     GEN:  Well nourished, well developed in no acute distress HEENT: Normal NECK: No JVD; No carotid bruits CARDIAC: RRR, no murmurs, rubs, gallops RESPIRATORY:  Clear to auscultation without rales, wheezing or rhonchi ABDOMEN: Soft, non-tender, non-distended MUSCULOSKELETAL:  No edema; No deformity  SKIN: Warm and dry NEUROLOGIC:  Alert and oriented x 3 PSYCHIATRIC:  Normal affect   ASSESSMENT:    1. SOB (shortness of breath)   2. Coronary artery disease of native artery of native heart with stable angina pectoris (HCC)   3. Palpitations   4. Essential hypertension   5. Mixed hyperlipidemia   6. Pulmonary hypertension (HCC)    PLAN:    In order of problems listed above:  #Nonobstructive CAD: Coronary CTA 10/2016 with 25-50% ostial RCA, 50-69% ostial LM, 25-50% prox LAD. FFR negative. Ca score 220 (81% for age, gender matched controls). Most recent myoview in 02/2022 with no ischemia or infarction. TTE 02/2022 with LVEF 55-60%, no significant valve disease.  -Continue crestor 62m daily -Continue ASA 818mdaily  #HTN: Very well controlled and at goal.  -Continue coreg 12.64m48mID -Continue valsartan 160m80mily  #Mild Pulmonary HTN>IMPROVED: TTE 02/2022 with normal RV systolic function. RVSP 30.7 (improved). No significant symptoms. Has quit smoking. -Continue to monitor  #HLD: -Continue crestor 20mg31mly -LDL 71 in 08/2021     Follow up: 6 months  Medication Adjustments/Labs and Tests Ordered: Current medicines are reviewed at length with the patient today.  Concerns regarding medicines are outlined above.  No orders of the defined types  were placed in this encounter.  Meds ordered this encounter  Medications   montelukast (SINGULAIR) 10 MG tablet    Sig: Take 1 tablet (10 mg total) by mouth daily.    Dispense:  90 tablet    Refill:  2   famotidine (PEPCID) 20 MG tablet    Sig: Take 1 tablet (20 mg total) by mouth at bedtime.    Dispense:  90 tablet    Refill:  2     Patient Instructions  Medication Instructions:   Your physician recommends that you continue on your current medications as directed. Please refer to the Current Medication list given to you today.  *If you need a refill on your cardiac medications before your next appointment, please call your pharmacy*    Follow-Up: At [facility], you and your health needs are our priority.  As part of our continuing mission to provide you with exceptional heart care, we have created designated Provider Care Teams.  These Care Teams include your primary Cardiologist (physician) and Advanced Practice Providers (APPs -  Physician Assistants and Nurse Practitioners) who all work together to provide you with the care you need, when you need it.  We recommend signing up for the patient portal called "MyChart".  Sign up information is provided on this After Visit Summary.  MyChart is used to connect with patients for Virtual Visits (Telemedicine).  Patients are able to view lab/test results, encounter notes, upcoming appointments, etc.  Non-urgent messages can be sent to your provider as well.   To learn more about what you can do with MyChart, go to [email or web address].    Your next appointment:   6 month(s)  The format for your next appointment:   In Person  Provider:   Freada Bergeron, MD      Important Information About [place],acting as a scribe for Freada Bergeron, MD.,have documented all relevant documentation on the behalf of Freada Bergeron, MD,as directed by  Freada Bergeron, MD while in the presence of Freada Bergeron, MD.   I, Freada Bergeron, MD, have reviewed all documentation for this visit. The documentation on 04/21/22 for the exam, diagnosis, procedures, and orders are all accurate and complete.    Signed, Freada Bergeron, MD  04/21/2022 9:35 AM    [place]

## 2022-04-21 NOTE — Patient Instructions (Signed)
Medication Instructions:   Your physician recommends that you continue on your current medications as directed. Please refer to the Current Medication list given to you today.  *If you need a refill on your cardiac medications before your next appointment, please call your pharmacy*   Follow-Up: At [facility], you and your health needs are our priority.  As part of our continuing mission to provide you with exceptional heart care, we have created designated Provider Care Teams.  These Care Teams include your primary Cardiologist (physician) and Advanced Practice Providers (APPs -  Physician Assistants and Nurse Practitioners) who all work together to provide you with the care you need, when you need it.  We recommend signing up for the patient portal called "MyChart".  Sign up information is provided on this After Visit Summary.  MyChart is used to connect with patients for Virtual Visits (Telemedicine).  Patients are able to view lab/test results, encounter notes, upcoming appointments, etc.  Non-urgent messages can be sent to your provider as well.   To learn more about what you can do with MyChart, go to [email or web address].    Your next appointment:   6 month(s)  The format for your next appointment:   In Person  Provider:   Heather E Pemberton, MD     Important Information About [place]       

## 2022-04-21 NOTE — Progress Notes (Unsigned)
Amy Escobar D.Amy Escobar Phone: [phone]   Assessment and Plan:     There are no diagnoses linked to this encounter.  ***   Pertinent previous records reviewed include ***   Follow Up: ***     Subjective:   I, Amy Escobar, am serving as a Education administrator for Doctor Amy Escobar   Chief Complaint: bilateral knee injection    HPI:  09/30/2021 Patient given injection today and tolerated the procedure well, discussed icing regimen and home exercise, discussed which activities to do which wants to avoid.  Discussed which activities to potentially avoid.  Patient does not want to do the home exercises on a regular basis at the moment.  Follow-up with me again 6 to 8 weeks.  Can repeat every 3 months if needed   Chronic, with exacerbation.  Has had this difficulty for some time.  Discussed the possibility of a lumbar radiculopathy with radicular symptoms.  Discussed which activities to do which ones to avoid.  Follow-up again in 3 months   Update 12/09/2021 Amy Escobar is a 76 y.o. female coming in with complaint of B hip and B knee pain. Saw Dr. Glennon Escobar twice in May 2023 for shoulder, elbow, and wrist pain after a fall. Worsening pain in the knees  Patient states still sore from fall. Time for injections in knees and wondering about GT injections because she has to see a kidney doctor.     Patient when seen another provider was found to have a possible lung nodule.  Was sent for a CT chest with contrast that did not show any significant nodule noted on CT scan but was found to have significant right hydronephrosis.  And then was sent for CT abdomen pelvis which did show that there was a significant right-sided hydronephrosis with possible stricture and no sign of true stone formation.   Patient also GFR has been decreased with most recent labs of 59.8   02/27/2022 Patient states wants knee and injection and  hip injections   03/11/2022 Amy Escobar is a 76 y.o. female coming in with complaint of B knee pain. Injected by Dr. Glennon Escobar on 02/27/2022. Durolane approved. Patient states that she is would like injections in backside and gel injections.    04/14/2022 Patient states she is in a lot of pain  , her left knee goes out and her right knee hurts a lot   04/22/2022 Patient states  Relevant Historical Information: DM type II, hypertension  Additional pertinent review of systems negative.   Current Outpatient Medications:    alendronate (FOSAMAX) 70 MG tablet, Take 1 tablet (70 mg total) by mouth every 7 (seven) days. Take with a full glass of water on an empty stomach., Disp: 4 tablet, Rfl: 11   AMBULATORY NON FORMULARY MEDICATION, Left wrist brace, Disp: 1 Piece, Rfl: 0   aspirin EC 81 MG tablet, Take 81 mg by mouth daily. Swallow whole., Disp: , Rfl:    Blood Glucose Monitoring Suppl (ONETOUCH VERIO FLEX SYSTEM) w/Device KIT, USE TO TEST BLOOD SUGAR DAILY AS DIRECTED, Disp: 1 kit, Rfl: 0   calcium elemental as carbonate (ANTACID MAXIMUM) 400 MG chewable tablet, Chew 1,000 mg by mouth as needed for heartburn., Disp: , Rfl:    carvedilol (COREG) 12.5 MG tablet, TAKE 1 TABLET BY MOUTH TWO TIMES A DAY WITH A MEAL, Disp: 180 tablet, Rfl: 2   cephALEXin (KEFLEX) 250 MG capsule, Take  250 mg by mouth daily., Disp: , Rfl:    cholecalciferol (VITAMIN D3) 25 MCG (1000 UNIT) tablet, Take 1,000 Units by mouth daily., Disp: , Rfl:    esomeprazole (NEXIUM) 40 MG capsule, TAKE ONE CAPSULE BY MOUTH TWICE A DAY 30 TO 60 MINUTES BEFORE YOUR FIRST AND LAST MEALS OF THE DAY *NEED APPOINTMENT FOR FURTHER REFILLS*, Disp: 180 capsule, Rfl: 0   famotidine (PEPCID) 20 MG tablet, TAKE ONE TABLET BY MOUTH EVERY NIGHT AT BEDTIME, Disp: 90 tablet, Rfl: 0   fesoterodine (TOVIAZ) 4 MG TB24 tablet, Take 4 mg daily by mouth., Disp: , Rfl:    gabapentin (NEURONTIN) 300 MG capsule, Take 1 capsule (300 mg total) by mouth at  bedtime., Disp: 90 capsule, Rfl: 3   glimepiride (AMARYL) 1 MG tablet, TAKE ONE TABLET BY MOUTH DAILY WITH BREAKFAST, Disp: 30 tablet, Rfl: 2   glucose blood (ONETOUCH VERIO) test strip, USE TO CHECK BLOOD SUGAE TWO TIMES A DAY, Disp: 100 strip, Rfl: 3   guaiFENesin (MUCINEX) 600 MG 12 hr tablet, Take 600 mg by mouth 2 (two) times daily as needed., Disp: , Rfl:    montelukast (SINGULAIR) 10 MG tablet, Take 1 tablet by mouth daily., Disp: , Rfl:    Respiratory Therapy Supplies MISC, Use flutter valve as needed to break the coughing cycle., Disp: 1 each, Rfl: 1   rosuvastatin (CRESTOR) 20 MG tablet, Take 1 tablet (20 mg total) by mouth daily., Disp: 90 tablet, Rfl: 3   valsartan (DIOVAN) 160 MG tablet, Take 1 tablet (160 mg total) by mouth daily., Disp: 90 tablet, Rfl: 2   Objective:     There were no vitals filed for this visit.    There is no height or weight on file to calculate BMI.    Physical Exam:    ***   Electronically signed by:  Amy Escobar D.Amy Escobar [facility] 7:57 AM 04/21/22

## 2022-04-22 ENCOUNTER — Ambulatory Visit (INDEPENDENT_AMBULATORY_CARE_PROVIDER_SITE_OTHER): Payer: Medicare Other | Admitting: Sports Medicine

## 2022-04-22 VITALS — BP 138/80 | Ht 64.0 in | Wt 168.0 lb

## 2022-04-22 DIAGNOSIS — M170 Bilateral primary osteoarthritis of knee: Secondary | ICD-10-CM | POA: Diagnosis not present

## 2022-04-22 DIAGNOSIS — S83242D Other tear of medial meniscus, current injury, left knee, subsequent encounter: Secondary | ICD-10-CM

## 2022-04-22 DIAGNOSIS — M25562 Pain in left knee: Secondary | ICD-10-CM | POA: Diagnosis not present

## 2022-04-22 DIAGNOSIS — M25561 Pain in right knee: Secondary | ICD-10-CM | POA: Diagnosis not present

## 2022-04-22 DIAGNOSIS — M23203 Derangement of unspecified medial meniscus due to old tear or injury, right knee: Secondary | ICD-10-CM

## 2022-04-22 DIAGNOSIS — G8929 Other chronic pain: Secondary | ICD-10-CM | POA: Diagnosis not present

## 2022-04-22 NOTE — Patient Instructions (Addendum)
Good to see you  Ortho referral  Tylenol [phone] mg 2-3 times a day for pain relief  As needed follow up

## 2022-04-28 ENCOUNTER — Ambulatory Visit (INDEPENDENT_AMBULATORY_CARE_PROVIDER_SITE_OTHER): Payer: Medicare Other | Admitting: Internal Medicine

## 2022-04-28 ENCOUNTER — Encounter: Payer: Self-pay | Admitting: Internal Medicine

## 2022-04-28 VITALS — BP 130/80 | HR 68 | Ht 64.0 in | Wt 172.0 lb

## 2022-04-28 DIAGNOSIS — E113393 Type 2 diabetes mellitus with moderate nonproliferative diabetic retinopathy without macular edema, bilateral: Secondary | ICD-10-CM

## 2022-04-28 DIAGNOSIS — E1142 Type 2 diabetes mellitus with diabetic polyneuropathy: Secondary | ICD-10-CM | POA: Diagnosis not present

## 2022-04-28 DIAGNOSIS — E1159 Type 2 diabetes mellitus with other circulatory complications: Secondary | ICD-10-CM | POA: Diagnosis not present

## 2022-04-28 MED ORDER — SEMAGLUTIDE(0.25 OR 0.5MG/DOS) 2 MG/3ML ~~LOC~~ SOPN: 0.5000 mg | PEN_INJECTOR | SUBCUTANEOUS | 3 refills | Status: DC

## 2022-04-28 NOTE — Patient Instructions (Signed)
STOP Glimepiride  Start Ozempic 0.25 mg once weekly for 6 weeks, than increase to 0.5 mg once weekly      HOW TO TREAT LOW BLOOD SUGARS (Blood sugar LESS THAN 70 MG/DL) Please follow the RULE OF 15 for the treatment of hypoglycemia treatment (when your (blood sugars are less than 70 mg/dL)   STEP 1: Take 15 grams of carbohydrates when your blood sugar is low, which includes:  3-4 GLUCOSE TABS  OR 3-4 OZ OF JUICE OR REGULAR SODA OR ONE TUBE OF GLUCOSE GEL    STEP 2: RECHECK blood sugar in 15 MINUTES STEP 3: If your blood sugar is still low at the 15 minute recheck --> then, go back to STEP 1 and treat AGAIN with another 15 grams of carbohydrates.

## 2022-04-28 NOTE — Progress Notes (Signed)
Name: Amy Escobar  Age/ Sex: 76 y.o., female   MRN/ DOB: [id number], [birth year]     PCP: [name], FNP   Reason for Endocrinology Evaluation: Type 2 Diabetes Mellitus  Initial Endocrine Consultative Visit: 11/25/2012    PATIENT IDENTIFIER: Amy Escobar is a 76 y.o. female with a past medical history of T2DM, HTN, non-obstructive CAD , and Dyslipidemia . The patient has followed with Endocrinology clinic since 11/25/2012 for consultative assistance with management of her diabetes.  DIABETIC HISTORY:  Amy Escobar was diagnosed with DM years ago, She has been on Metformin . Her hemoglobin A1c has ranged from 5.6% in 2021, peaking at 7.2% in 2015.  Invokana caused genital infections   Transitioned care from Dr. Loanne Drilling 05/2021  She was started on glimepiride in September 2023 due to hyperglycemia, but this was discontinued as it caused weight gain Started on Ozempic 03/2022 SUBJECTIVE:   During the last visit (10/23/2021): A1c 5.8%     Today (04/28/2022): Amy Escobar  is here for a follow up on diabetes management. She checks her blood sugars 1 times daily. The patient has not had hypoglycemic episodes since the last clinic visit.   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 Denies: CKD  Last Eye Exam: Completed 10/02/2021  Macrovascular complications:  Non-Obstructive CAD - follows with Cardiology  Denies: CVA, PVD   HISTORY:  Past Medical History:  Past Medical History:  Diagnosis  Date   Alkaline phosphatase deficiency    w/u Ne   Allergic rhinitis    Allergy    Anxiety    Arthritis    Cataract    BILATERAL-REMOVED   DM2 (diabetes mellitus, type 2) ([place])    GERD (gastroesophageal reflux disease)    Gout    Hemorrhoids    Hyperlipidemia    Hypertension    Mild pulmonary hypertension (HCC)    NSVT (nonsustained ventricular tachycardia) (HCC)    Osteoporosis    PVC's (premature ventricular contractions)    Thyroid nodule    small   Tubular adenoma of colon 2017   Urticaria    Uterine fibroid    Past Surgical History:  Past Surgical History:  Procedure Laterality Date   CATARACT EXTRACTION Bilateral 2016   COLONOSCOPY  2007   echocardiogram (other)  01/16/2002   POLYPECTOMY     removed tumors from foot nerves  04/1999   stress cardiolite  02/12/2006   TOENAIL EXCISION     TUBAL LIGATION     TYMPANOSTOMY TUBE PLACEMENT     Social History:  reports that she quit smoking about 53 years ago. Her smoking use included cigarettes. She has a 1.25 pack-year smoking history. She has never used smokeless tobacco. She reports that she does not drink alcohol and does not use drugs. Family History:  Family History  Problem Relation Age of Onset   Heart disease Mother    Allergic rhinitis Mother    Heart disease Father    Stomach cancer Maternal Grandmother    Heart  disease Maternal Grandfather    Rectal cancer Maternal Grandfather    Colon cancer Neg Hx    Breast cancer Neg Hx    Colon polyps Neg Hx    Esophageal cancer Neg Hx      HOME MEDICATIONS: Allergies as of 04/28/2022       Reactions   Olmesartan Medoxomil    REACTION: headache        Medication List        Accurate as of April 28, 2022  8:23 AM. If you have any questions, ask your nurse or doctor.          alendronate 70 MG tablet Commonly known as: FOSAMAX Take 1 tablet (70 mg total) by mouth every 7 (seven) days. Take with a full glass of water on an empty stomach.    AMBULATORY NON FORMULARY MEDICATION Left wrist brace   Antacid Maximum 400 MG chewable tablet Generic drug: calcium elemental as carbonate Chew 1,000 mg by mouth as needed for heartburn.   aspirin EC 81 MG tablet Take 81 mg by mouth daily. Swallow whole.   carvedilol 12.5 MG tablet Commonly known as: COREG TAKE 1 TABLET BY MOUTH TWO TIMES A DAY WITH A MEAL   cephALEXin 250 MG capsule Commonly known as: KEFLEX Take 250 mg by mouth daily.   cholecalciferol 25 MCG (1000 UNIT) tablet Commonly known as: VITAMIN D3 Take 1,000 Units by mouth daily.   esomeprazole 40 MG capsule Commonly known as: NEXIUM TAKE ONE CAPSULE BY MOUTH TWICE A DAY 30 TO 60 MINUTES BEFORE YOUR FIRST AND LAST MEALS OF THE DAY *NEED APPOINTMENT FOR FURTHER REFILLS*   famotidine 20 MG tablet Commonly known as: PEPCID Take 1 tablet (20 mg total) by mouth at bedtime.   fesoterodine 4 MG Tb24 tablet Commonly known as: TOVIAZ Take 4 mg daily by mouth.   gabapentin 300 MG capsule Commonly known as: NEURONTIN Take 1 capsule (300 mg total) by mouth at bedtime.   glimepiride 1 MG tablet Commonly known as: AMARYL TAKE ONE TABLET BY MOUTH DAILY WITH BREAKFAST   guaiFENesin 600 MG 12 hr tablet Commonly known as: MUCINEX Take 600 mg by mouth 2 (two) times daily as needed.   montelukast 10 MG tablet Commonly known as: SINGULAIR Take 1 tablet (10 mg total) by mouth daily.   OneTouch Verio Flex System w/Device Kit USE TO TEST BLOOD SUGAR DAILY AS DIRECTED   OneTouch Verio test strip Generic drug: glucose blood USE TO CHECK BLOOD SUGAE TWO TIMES A DAY   Respiratory Therapy Supplies Misc Use flutter valve as needed to break the coughing cycle.   rosuvastatin 20 MG tablet Commonly known as: CRESTOR Take 1 tablet (20 mg total) by mouth daily.   valsartan 160 MG tablet Commonly known as: DIOVAN Take 1 tablet (160 mg total) by mouth daily.         OBJECTIVE:   Vital Signs: BP 130/80 (BP Location:  Left Arm, Patient Position: Sitting, Cuff Size: Large)   Pulse 68   Ht _0  (1.626 m)   Wt 172 lb (78 kg)   SpO2 94%   BMI 29.52 kg/m   Wt Readings from Last 3 Encounters:  04/28/22 172 lb (78 kg)  04/22/22 168 lb (76.2 kg)  04/21/22 168 lb 12.8 oz (76.6 kg)     Exam: General: Pt appears well and is in NAD  Neck: General: Supple without adenopathy. Thyroid: Thyroid size normal.  No goiter or nodules appreciated.  Lungs: Clear  with good BS bilat with no rales, rhonchi, or wheezes  Heart: RRR with normal S1 and S2 and no gallops; no murmurs; no rub  Abdomen: soft, nontender, without masses or organomegaly palpable  Extremities: No pretibial edema.   Neuro: MS is good with appropriate affect, pt is alert and Ox3    DM foot exam: 06/18/2021   The skin of the feet is without sores or ulcerations. The pedal pulses are 2+ on right and 2+ on left. The sensation is decreased  to a screening 5.07, 10 gram monofilament on the left and absent at the left great toe           DATA REVIEWED:  Lab Results  Component Value Date   HGBA1C 6.8 (H) 03/03/2022   HGBA1C 5.9 09/16/2021   HGBA1C 5.8 (A) 06/18/2021   Lab Results  Component Value Date   MICROALBUR 5.5 (H) 06/18/2021   LDLCALC 71 09/16/2021   CREATININE 0.84 03/03/2022    Latest Reference Range & Units 09/16/21 13:27  Sodium 135 - 145 mEq/L 139  Potassium 3.5 - 5.1 mEq/L 3.9  Chloride 96 - 112 mEq/L 101  CO2 19 - 32 mEq/L 30  Glucose 70 - 99 mg/dL 100 (H)  BUN 6 - 23 mg/dL 12  Creatinine 0.40 - 1.20 mg/dL 0.76  Calcium 8.4 - 10.5 mg/dL 9.0  Alkaline Phosphatase 39 - 117 U/L 80  Albumin 3.5 - 5.2 g/dL 3.8  AST 0 - 37 U/L 23  ALT 0 - 35 U/L 22  Total Protein 6.0 - 8.3 g/dL 6.9  Total Bilirubin 0.2 - 1.2 mg/dL 0.3  GFR >60.00 mL/min 76.26    Latest Reference Range & Units 09/16/21 13:27  Total CHOL/HDL Ratio  3  Cholesterol 0 - 200 mg/dL 132  HDL Cholesterol >39.00 mg/dL 41.20  LDL (calc) 0 - 99 mg/dL 71   NonHDL  91.04  Triglycerides 0.0 - 149.0 mg/dL 99.0  VLDL 0.0 - 40.0 mg/dL 19.8     ASSESSMENT / PLAN / RECOMMENDATIONS:   1) Type 2 Diabetes Mellitus, Optimally controlled, With Retinopathic, neuropathic and macrovascular complications - Most recent A1c of 6.8 %. Goal A1c < 7.0 %.    - A1c remains at goal.  - She is not interested in restarting Metformin  - Glimepiride caused weight gain -We discussed starting Ozempic, cautioned against GI side effects, she has no history of pancreatitis -She was given a sample pen of Ozempic and was trained by my assistant on injection technique  MEDICATIONS: Start Ozempic 0.25 mg weekly for 6 weeks, then increase to 0.5 milligram weekly  EDUCATION / INSTRUCTIONS: BG monitoring instructions: Patient is instructed to check her blood sugars 1 times a day    2) Diabetic complications:  Eye: Does  have known diabetic retinopathy.  Neuro/ Feet: Does  have known diabetic peripheral neuropathy .  Renal: Patient does not have known baseline CKD. She   is  on an ACEI/ARB at present.     F/U in 6 months   Signed electronically by: Mack Guise, MD  [facility] Endocrinology  [address] [place], [place] [address] Phone: [phone] FAX: [phone]   CC: [name], [place] [address] [place] [address] Phone: [phone]  Fax: [phone]  Return to Endocrinology clinic as below: Future Appointments  Date Time Provider [place]  05/07/2022  1:00 PM Mcarthur Rossetti, MD [facility] None  10/06/2022  8:00 AM [place],  Clent Demark, MD [facility] None

## 2022-05-01 DIAGNOSIS — R339 Retention of urine, unspecified: Secondary | ICD-10-CM | POA: Diagnosis not present

## 2022-05-05 ENCOUNTER — Telehealth: Payer: Self-pay

## 2022-05-05 NOTE — Telephone Encounter (Signed)
Spoke with patient and advised on directions for Ozempic. 0.'25mg'$  for 6 weeks and then increase to 0.'5mg'$ . Patient verbalized understanding

## 2022-05-06 DIAGNOSIS — N130 Hydronephrosis with ureteropelvic junction obstruction: Secondary | ICD-10-CM | POA: Diagnosis not present

## 2022-05-06 DIAGNOSIS — N3020 Other chronic cystitis without hematuria: Secondary | ICD-10-CM | POA: Diagnosis not present

## 2022-05-07 ENCOUNTER — Ambulatory Visit (INDEPENDENT_AMBULATORY_CARE_PROVIDER_SITE_OTHER): Payer: Medicare Other | Admitting: Orthopaedic Surgery

## 2022-05-07 ENCOUNTER — Other Ambulatory Visit: Payer: Self-pay

## 2022-05-07 ENCOUNTER — Encounter: Payer: Self-pay | Admitting: Orthopaedic Surgery

## 2022-05-07 DIAGNOSIS — G8929 Other chronic pain: Secondary | ICD-10-CM

## 2022-05-07 DIAGNOSIS — M25561 Pain in right knee: Secondary | ICD-10-CM

## 2022-05-07 DIAGNOSIS — S83232D Complex tear of medial meniscus, current injury, left knee, subsequent encounter: Secondary | ICD-10-CM

## 2022-05-07 DIAGNOSIS — M25562 Pain in left knee: Secondary | ICD-10-CM

## 2022-05-07 NOTE — Progress Notes (Signed)
The patient is a 76 year old female that I am seeing for the first time.  She is accompanied by her daughter today.  She is sent from Dr. Glennon Mac to evaluate and treat bilateral knee pain with the left worse than right and MRIs that accompany her showing small meniscal root tears bilaterally of the posterior and the medial meniscus.  She said she did not have any knee issues before until mechanical fall earlier this year.  After that fall she had had steroid injections and hyaluronic acid injections in the knees.  The injections first helped but then got to where they have not helped.  She does wear hinged knee braces on both knees for support.  She does ambulate with a 4 pronged cane in her right hand.  The left knee hurts her worse.  There are MRIs and plain films on the canopy system for me to review.  I did review all of her notes in epic in terms of her medical issues and her medications.  She says her left knee does not lock and catch but it hurts quite a bit along the medial joint lines where she points to and hurts with weightbearing activities and mobility.  What she describes as more of an arthritic type of pain.  Examination of both knees shows just minimal effusion.  She has medial joint tenderness bilaterally.  I do feel that there is aggravation of the meniscus on the medial aspect when I rotate the tibia on the femur from flexion to extension on that medial aspect with a positive McMurray's sign.  Her Lachman's exam is negative and her lateral compartment is intact.  Her right knee has just a little bit of medial tenderness but no instability on exam either.  The MRI of both knees were reviewed with her.  There is some mild arthritic changes in the left knee and there is definitely subchondral edema in the medial tibial plateau consistent with arthritic changes.  She does have a meniscal root tear as well in the medial meniscus at the posterior horn.  This is the same on the right side  but there is no subchondral edema on the right knee and some mild arthritic changes.  I did extensive discussion about her knee and about treatment options.  It sounds like she is got a lot of conservative treatment options and I cannot guarantee her arthroscopic intervention would help from an arthritic standpoint but her arthritis does not seem to be significant.  I feel that she should go without the knee braces and try a course of outpatient physical therapy for just generalized strengthening of both knees and working on her mobility.  We can always then proceed with an arthroscopic intervention of the left knee if this is not helping.  I did describe in detail what that type of surgery involves.  We will set up outpatient physical therapy for her knees and I would like to see her back in 4 weeks to see how she is doing overall.  All question concerns were answered and addressed.  She and her daughter agree with this treatment plan.

## 2022-05-08 ENCOUNTER — Other Ambulatory Visit: Payer: Self-pay | Admitting: Family

## 2022-05-08 DIAGNOSIS — I25118 Atherosclerotic heart disease of native coronary artery with other forms of angina pectoris: Secondary | ICD-10-CM

## 2022-05-08 DIAGNOSIS — E785 Hyperlipidemia, unspecified: Secondary | ICD-10-CM

## 2022-05-08 NOTE — Telephone Encounter (Signed)
Rx(s) sent to pharmacy electronically.  

## 2022-05-18 ENCOUNTER — Ambulatory Visit (INDEPENDENT_AMBULATORY_CARE_PROVIDER_SITE_OTHER): Payer: Medicare Other | Admitting: Rehabilitative and Restorative Service Providers"

## 2022-05-18 ENCOUNTER — Encounter: Payer: Self-pay | Admitting: Rehabilitative and Restorative Service Providers"

## 2022-05-18 DIAGNOSIS — R262 Difficulty in walking, not elsewhere classified: Secondary | ICD-10-CM

## 2022-05-18 DIAGNOSIS — M25662 Stiffness of left knee, not elsewhere classified: Secondary | ICD-10-CM

## 2022-05-18 DIAGNOSIS — M25562 Pain in left knee: Secondary | ICD-10-CM | POA: Diagnosis not present

## 2022-05-18 DIAGNOSIS — M25561 Pain in right knee: Secondary | ICD-10-CM | POA: Diagnosis not present

## 2022-05-18 DIAGNOSIS — R600 Localized edema: Secondary | ICD-10-CM | POA: Diagnosis not present

## 2022-05-18 DIAGNOSIS — M25661 Stiffness of right knee, not elsewhere classified: Secondary | ICD-10-CM

## 2022-05-18 NOTE — Therapy (Signed)
OUTPATIENT PHYSICAL THERAPY LOWER EXTREMITY EVALUATION   Patient Name: Amy Escobar MRN: [id number] DOB:May 07, 1946, 76 y.o., female Today's Date: 05/18/2022  END OF SESSION:  PT End of Session - 05/18/22 1637     Visit Number 1    Number of Visits 16    Progress Note Due on Visit 10    PT Start Time 0808    PT Stop Time 8841    PT Time Calculation (min) 39 min    Activity Tolerance Patient tolerated treatment well    Behavior During Therapy WFL for tasks assessed/performed             Past Medical History:  Diagnosis Date   Alkaline phosphatase deficiency    w/u Ne   Allergic rhinitis    Allergy    Anxiety    Arthritis    Cataract    BILATERAL-REMOVED   DM2 (diabetes mellitus, type 2) ([place])    GERD (gastroesophageal reflux disease)    Gout    Hemorrhoids    Hyperlipidemia    Hypertension    Mild pulmonary hypertension (HCC)    NSVT (nonsustained ventricular tachycardia) (HCC)    Osteoporosis    PVC's (premature ventricular contractions)    Thyroid nodule    small   Tubular adenoma of colon 2017   Urticaria    Uterine fibroid    Past Surgical History:  Procedure Laterality Date   CATARACT EXTRACTION Bilateral 2016   COLONOSCOPY  2007   echocardiogram (other)  01/16/2002   POLYPECTOMY     removed tumors from foot nerves  04/1999   stress cardiolite  02/12/2006   TOENAIL EXCISION     TUBAL LIGATION     TYMPANOSTOMY TUBE PLACEMENT     Patient Active Problem List   Diagnosis Date Noted   Type 2 diabetes mellitus with diabetic polyneuropathy, without long-term current use of insulin ([place]) 06/18/2021   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 without macular edema, without long-term current use of insulin ([place]) 06/18/2021   Diabetes mellitus ([place]) 06/18/2021   Osteoarthritis of right AC (acromioclavicular) joint 03/06/2021   Osteoarthritis of right glenohumeral joint 03/06/2021   Foot pain, bilateral  12/26/2020   Diabetic neuropathy ([place]) 01/10/2020   Hav (hallux abducto valgus), unspecified laterality 01/10/2020   Chronic arthropathy 01/10/2020   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 edema associated with type 2 diabetes mellitus ([place]) 12/26/2019   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   AC (acromioclavicular) arthritis 08/02/2019   Unspecified inflammatory spondylopathy, 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   Claudication ([place]) 04/19/2019   Morbid obesity ([place]) 04/19/2019   Suspected COVID-19 virus infection 04/13/2019   Upper airway cough syndrome 08/22/2018   Greater trochanteric bursitis of both hips 06/15/2018   Trigger point of shoulder region, left 02/07/2018   Hypertension    Mild pulmonary hypertension (HCC)    NSVT (nonsustained ventricular tachycardia) (HCC)    PVC's (premature ventricular contractions)    URI (upper respiratory infection) 08/09/2016   Neck pain 02/10/2016   Urinary incontinence 02/10/2016   Degenerative arthritis of knee, bilateral 07/17/2015   Knee MCL sprain 06/07/2015   Discomfort in chest 02/15/2015   Chest pain 02/15/2015   DM type 2, controlled, with complication ([place])    Wellness examination 08/06/2014   Acute meniscal tear of knee 02/13/2014   Primary localized osteoarthrosis, lower leg 02/13/2014   Gastrocnemius tear 12/25/2013   UTI (urinary tract infection) 12/20/2013  Right knee pain 12/20/2013   Pulmonary hypertension ([place]) 10/06/2013   DOE (dyspnea on exertion) 08/04/2013   Dysphagia, unspecified(787.20) 08/10/2012   Hip pain, left 02/02/2012   Painful respiration 05/25/2011   Encounter for long-term (current) use of other medications 01/30/2011   PELVIC PAIN, LEFT 07/30/2010   TOBACCO USE, QUIT 07/07/2010   FATIGUE 12/20/2009   Headache 12/20/2009   Low back pain 12/26/2008   Disorder of liver 04/13/2008   FOOT PAIN, BILATERAL 04/13/2008   FIBROIDS, UTERUS 11/24/2007   THYROID  NODULE, LEFT 11/24/2007   HYPERCHOLESTEROLEMIA 11/24/2007   CARPAL TUNNEL SYNDROME, BILATERAL 11/24/2007   ALKALINE PHOSPHATASE, ELEVATED 11/24/2007   Gout 08/10/2007   ANXIETY 08/10/2007   Essential hypertension 08/10/2007   Cough 08/01/2007   Perennial and seasonal allergic rhinitis 01/14/2007   GERD 01/14/2007   Osteoporosis 01/14/2007    PCP: [name], FNP  REFERRING PROVIDER: Mcarthur Rossetti, MD  REFERRING DIAG:  Diagnosis  [phone] (ICD-10-CM) - Chronic pain of left knee  M25.561,G89.29 (ICD-10-CM) - Chronic pain of right knee  S83.232D (ICD-10-CM) - Complex tear of medial meniscus of left knee as current injury, subsequent encounter    THERAPY DIAG:  Difficulty in walking, not elsewhere classified  Localized edema  Stiffness of left knee, not elsewhere classified  Stiffness of right knee, not elsewhere classified  Pain in both knees, unspecified chronicity  Rationale for Evaluation and Treatment: Rehabilitation  ONSET DATE: Chronic  SUBJECTIVE:   SUBJECTIVE STATEMENT: Amy Escobar reports a fall in May of this year. Bilateral knee symptoms have gotten worse since that time. She is now using a cane. She would like to avoid an arthroscopy if possible and return to more normal function and gait.  PERTINENT HISTORY: Are you having pain? Type 2 DM, HLD, HTN, osteoporosis PAIN:  3-7/10 on the VAS  PRECAUTIONS: None  WEIGHT BEARING RESTRICTIONS: No  FALLS:  Has patient fallen in last 6 months? Yes. Number of falls 1  LIVING ENVIRONMENT: Lives with: lives alone Lives in: House/apartment Stairs:  No stairs at home, uses handrail when she has stairs Has following equipment at home: Quad cane small base  OCCUPATION: Retired  PLOF: Independent  PATIENT GOALS: Return to normal house chores, shopping and walking without the cane or bilateral knee pain.  NEXT MD VISIT:   OBJECTIVE:   DIAGNOSTIC FINDINGS:   Left knee IMPRESSION: 1.  Root tear of the posterior horn of the medial meniscus with resulting mild peripheral extrusion of the meniscus from the joint space. 2. Mild medial and patellofemoral compartment degenerative chondrosis. No acute osseous findings. 3. Intact lateral meniscus, cruciate and collateral ligaments. 4. Heterogeneous, serpiginous T2 hyperintensity centrally and laterally in Hoffa's fat may reflect a venous malformation or atypical ganglion.  Right knee IMPRESSION: 1. Chronic root tear of the posterior horn of the medial meniscus with progressive peripheral extrusion of the meniscus from the joint space. 2. Progressive medial compartment osteoarthritis. No acute osseous findings. 3. The lateral meniscus, cruciate and collateral ligaments are intact. 4. Small joint effusion and small Baker's cyst.  PATIENT SURVEYS:  FOTO 59 (Goal 63 in 12 visits)  COGNITION: Overall cognitive status: Within functional limits for tasks assessed     SENSATION: No complaints of peripheral paresthesias, new tingling or numbness  EDEMA:  Noted and not objectively assessed for bilateral knees  LOWER EXTREMITY ROM:  Active ROM Right eval Left eval  Hip flexion    Hip extension    Hip abduction    Hip  adduction    Hip internal rotation    Hip external rotation    Knee flexion 124 90  Knee extension -2 0  Ankle dorsiflexion    Ankle plantarflexion    Ankle inversion    Ankle eversion     (Blank rows = not tested)  LOWER EXTREMITY STRENGTH:  Strength in pounds assessed by hand-held dynamometer Right eval Left eval  Hip flexion    Hip extension    Hip abduction    Hip adduction    Hip internal rotation    Hip external rotation    Knee flexion    Knee extension 19.2 17.3  Ankle dorsiflexion    Ankle plantarflexion    Ankle inversion    Ankle eversion     (Blank rows = not tested)  GAIT: Distance walked: In the clinic Assistive device utilized: Quad cane small base Level of  assistance: Complete Independence Comments: Doesn't use the cane at times but doesn't trust her leg strength   TODAY'S TREATMENT:                                                                                                                              DATE: 05/18/2022  Quadriceps sets 10X 5 seconds Seated straight leg raises 2 sets of 5 for 3 seconds Tailgate knee flexion 2 minutes  PATIENT EDUCATION:  Education details: Reviewed exam findings and HEP Person educated: Patient Education method: Explanation, Demonstration, Tactile cues, Verbal cues, and Handouts Education comprehension: verbalized understanding, returned demonstration, verbal cues required, tactile cues required, and needs further education  HOME EXERCISE PROGRAM: Access Code: [email or web address] URL: [email or web address] Date: 05/18/2022 Prepared by: Vista Mink  Exercises - Supine Quadricep Sets  - 3-5 x daily - 7 x weekly - 2-3 sets - 10 reps - 5 second hold - Seated Knee Flexion AAROM  - 3 x daily - 7 x weekly - 1 sets - 1 reps - 3 minutes hold - Small Range Straight Leg Raise  - 3-5 x daily - 7 x weekly - 2 sets - 5 reps - 3 seconds hold  ASSESSMENT:  CLINICAL IMPRESSION: Patient is a 76 y.o. female who was seen today for physical therapy evaluation and treatment for bilateral knee pain.  Beckham has a history of bilateral knee pain with symptoms worsening with a fall earlier this year.  She would like to return to walking and normal household and community activities with less pain, more stability and without use of her cane.  Currently, her quadriceps strength is quite poor and improving this will almost certainly improve her quality of life.  She may still be a candidate for a arthroscopic procedure, but she will review this with Dr. Ninfa Linden at her next follow-up.  OBJECTIVE IMPAIRMENTS: Abnormal gait, decreased activity tolerance, decreased endurance, decreased knowledge of condition, difficulty walking,  decreased ROM, decreased strength, increased edema, and pain.   ACTIVITY LIMITATIONS: carrying, lifting, bending, sitting, standing, squatting, stairs, bed mobility,  and locomotion level  PARTICIPATION LIMITATIONS: driving, shopping, and community activity  PERSONAL FACTORS: Type 2 DM, HLD, HTN, osteoporosis are also affecting patient's functional outcome.   REHAB POTENTIAL: Good  CLINICAL DECISION MAKING: Stable/uncomplicated  EVALUATION COMPLEXITY: Low   GOALS: Goals reviewed with patient? Yes  SHORT TERM GOALS: Target date: 07/13/2022 Samuel will improve her bilateral quadriceps strength to at least 30 pounds Baseline: Less than 20 pounds Goal status: INITIAL  2.  Jahlisa will improve her left knee flexion active range of motion to at least 110 degrees Baseline: 90 degrees Goal status: INITIAL  3.  Gwenneth will demonstrate independence with her day 1 home exercise program Baseline: Started 1120/2023 Goal status: INITIAL    LONG TERM GOALS: Target date: 07/13/2021  Improve Foto to 63 Baseline: 59 Goal status: INITIAL  2.  Charlyne will report bilateral knee pain consistently 0-4 out of 10 on the visual analog scale (VAS) Baseline: 3-7 out of 10 Goal status: INITIAL  3.  Improve bilateral quadricep strength to at least 50 pounds bilaterally Baseline: Less than 20 pounds Goal status: INITIAL  4.  Improve bilateral knee flexion active range of motion to 125 degrees and extension to 0 degrees Baseline: 90/124 and 0/-2 Goal status: INITIAL  5.  Graham will be independent with her long-term home exercise program at discharge Baseline: Started 05/18/2022 Goal status: INITIAL  PLAN:  PT FREQUENCY: 1-2x/week  PT DURATION: 8 weeks  PLANNED INTERVENTIONS: Therapeutic exercises, Therapeutic activity, Neuromuscular re-education, Balance training, Gait training, Patient/Family education, Self Care, Stair training, Cryotherapy, and Vasopneumatic device  PLAN FOR NEXT SESSION: Review  day 1 home exercise program, progress quadriceps strength, implement balance and functional activities as appropriate.   Farley Ly, PT, MPT 05/18/2022, 4:39 PM

## 2022-05-26 ENCOUNTER — Ambulatory Visit (INDEPENDENT_AMBULATORY_CARE_PROVIDER_SITE_OTHER): Payer: Medicare Other | Admitting: Physical Therapy

## 2022-05-26 ENCOUNTER — Encounter: Payer: Self-pay | Admitting: Physical Therapy

## 2022-05-26 DIAGNOSIS — R600 Localized edema: Secondary | ICD-10-CM

## 2022-05-26 DIAGNOSIS — M25661 Stiffness of right knee, not elsewhere classified: Secondary | ICD-10-CM

## 2022-05-26 DIAGNOSIS — M25561 Pain in right knee: Secondary | ICD-10-CM | POA: Diagnosis not present

## 2022-05-26 DIAGNOSIS — M25662 Stiffness of left knee, not elsewhere classified: Secondary | ICD-10-CM

## 2022-05-26 DIAGNOSIS — M25562 Pain in left knee: Secondary | ICD-10-CM | POA: Diagnosis not present

## 2022-05-26 DIAGNOSIS — R262 Difficulty in walking, not elsewhere classified: Secondary | ICD-10-CM

## 2022-05-26 NOTE — Therapy (Signed)
OUTPATIENT PHYSICAL THERAPY TREATMENT NOTE   Patient Name: Amy Escobar MRN: [id number] DOB:01-15-1946, 76 y.o., female Today's Date: 05/26/2022  PCP: [name], FNP  REFERRING PROVIDER: Mcarthur Rossetti, MD   END OF SESSION:   PT End of Session - 05/26/22 0905     Visit Number 2    Number of Visits 16    Progress Note Due on Visit 10    PT Start Time 0848    PT Stop Time 0928    PT Time Calculation (min) 40 min    Activity Tolerance Patient tolerated treatment well    Behavior During Therapy WFL for tasks assessed/performed             Past Medical History:  Diagnosis Date   Alkaline phosphatase deficiency    w/u Ne   Allergic rhinitis    Allergy    Anxiety    Arthritis    Cataract    BILATERAL-REMOVED   DM2 (diabetes mellitus, type 2) ([place])    GERD (gastroesophageal reflux disease)    Gout    Hemorrhoids    Hyperlipidemia    Hypertension    Mild pulmonary hypertension ([place])    NSVT (nonsustained ventricular tachycardia) ([place])    Osteoporosis    PVC's (premature ventricular contractions)    Thyroid nodule    small   Tubular adenoma of colon 2017   Urticaria    Uterine fibroid    Past Surgical History:  Procedure Laterality Date   CATARACT EXTRACTION Bilateral 2016   COLONOSCOPY  2007   echocardiogram (other)  01/16/2002   POLYPECTOMY     removed tumors from foot nerves  04/1999   stress cardiolite  02/12/2006   TOENAIL EXCISION     TUBAL LIGATION     TYMPANOSTOMY TUBE PLACEMENT     Patient Active Problem List   Diagnosis Date Noted   Type 2 diabetes mellitus with diabetic polyneuropathy, without long-term current use of insulin ([place]) 06/18/2021   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 without macular edema, without long-term current use of insulin ([place]) 06/18/2021   Diabetes mellitus ([place]) 06/18/2021   Osteoarthritis of right AC (acromioclavicular) joint 03/06/2021    Osteoarthritis of right glenohumeral joint 03/06/2021   Foot pain, bilateral 12/26/2020   Diabetic neuropathy ([place]) 01/10/2020   Hav (hallux abducto valgus), unspecified laterality 01/10/2020   Chronic arthropathy 01/10/2020   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 edema associated with type 2 diabetes mellitus ([place]) 12/26/2019   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   AC (acromioclavicular) arthritis 08/02/2019   Unspecified inflammatory spondylopathy, 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[place]) 04/19/2019   Claudication ([place]) 04/19/2019   Morbid obesity ([place]) 04/19/2019   Suspected COVID-19 virus infection 04/13/2019   Upper airway cough syndrome 08/22/2018   Greater trochanteric bursitis of both hips 06/15/2018   Trigger point of shoulder region, left 02/07/2018   Hypertension    Mild pulmonary hypertension (H[facility])    NSVT (nonsustained ventricular tachycardia) (H[facility])    PVC's (premature ventricular contractions)    URI (upper respiratory infection) 08/09/2016   Neck pain 02/10/2016   Urinary incontinence 02/10/2016   Degenerative arthritis of knee, bilateral 07/17/2015   Knee MCL sprain 06/07/2015   Discomfort in chest 02/15/2015   Chest pain 02/15/2015   DM type 2, controlled, with complication ([place])    Wellness examination 08/06/2014   Acute meniscal tear of knee 02/13/2014   Primary localized osteoarthrosis, lower leg 02/13/2014  Gastrocnemius tear 12/25/2013   UTI (urinary tract infection) 12/20/2013   Right knee pain 12/20/2013   Pulmonary hypertension ([place]) 10/06/2013   DOE (dyspnea on exertion) 08/04/2013   Dysphagia, unspecified(787.20) 08/10/2012   Hip pain, left 02/02/2012   Painful respiration 05/25/2011   Encounter for long-term (current) use of other medications 01/30/2011   PELVIC PAIN, LEFT 07/30/2010   TOBACCO USE, QUIT 07/07/2010   FATIGUE 12/20/2009   Headache 12/20/2009   Low back pain 12/26/2008   Disorder of liver 04/13/2008    FOOT PAIN, BILATERAL 04/13/2008   FIBROIDS, UTERUS 11/24/2007   THYROID NODULE, LEFT 11/24/2007   HYPERCHOLESTEROLEMIA 11/24/2007   CARPAL TUNNEL SYNDROME, BILATERAL 11/24/2007   ALKALINE PHOSPHATASE, ELEVATED 11/24/2007   Gout 08/10/2007   ANXIETY 08/10/2007   Essential hypertension 08/10/2007   Cough 08/01/2007   Perennial and seasonal allergic rhinitis 01/14/2007   GERD 01/14/2007   Osteoporosis 01/14/2007    REFERRING DIAG:  M25.562,G89.29 (ICD-10-CM) - Chronic pain of left knee  M25.561,G89.29 (ICD-10-CM) - Chronic pain of right knee  S83.232D (ICD-10-CM) - Complex tear of medial meniscus of left knee as current injury, subsequent encounter    THERAPY DIAG:  Difficulty in walking, not elsewhere classified  Localized edema  Stiffness of left knee, not elsewhere classified  Stiffness of right knee, not elsewhere classified  Pain in both knees, unspecified chronicity  Rationale for Evaluation and Treatment Rehabilitation  PERTINENT HISTORY: Type 2 DM, HLD, HTN, osteoporosis   PRECAUTIONS: none  SUBJECTIVE:                                                                                                                                                                                      SUBJECTIVE STATEMENT:   Pt stating compliance in her HEP and reporting more stiffness when first waking up.    PAIN:  Are you having pain? Yes, left knee 3/10, Rt knee 5/10   OBJECTIVE: (objective measures completed at initial evaluation unless otherwise dated) DIAGNOSTIC FINDINGS:    Left knee IMPRESSION: 1. Root tear of the posterior horn of the medial meniscus with resulting mild peripheral extrusion of the meniscus from the joint space. 2. Mild medial and patellofemoral compartment degenerative chondrosis. No acute osseous findings. 3. Intact lateral meniscus, cruciate and collateral ligaments. 4. Heterogeneous, serpiginous T2 hyperintensity centrally and laterally in  Hoffa's fat may reflect a venous malformation or atypical ganglion.   Right knee IMPRESSION: 1. Chronic root tear of the posterior horn of the medial meniscus with progressive peripheral extrusion of the meniscus from the joint space. 2. Progressive medial compartment osteoarthritis. No acute osseous findings. 3. The lateral meniscus, cruciate and collateral ligaments are  intact. 4. Small joint effusion and small Baker's cyst.   PATIENT SURVEYS:  FOTO 59 (Goal 63 in 12 visits)   COGNITION: Overall cognitive status: Within functional limits for tasks assessed                         SENSATION: No complaints of peripheral paresthesias, new tingling or numbness   EDEMA:  Noted and not objectively assessed for bilateral knees   LOWER EXTREMITY ROM:   Active ROM Right eval Left eval Rt/Left 05/26/22  Hip flexion       Hip extension       Hip abduction       Hip adduction       Hip internal rotation       Hip external rotation       Knee flexion 124 90 122/110  Knee extension -2 0 0/0  Ankle dorsiflexion       Ankle plantarflexion       Ankle inversion       Ankle eversion        (Blank rows = not tested)   LOWER EXTREMITY STRENGTH:   Strength in pounds assessed by hand-held dynamometer Right eval Left eval  Hip flexion      Hip extension      Hip abduction      Hip adduction      Hip internal rotation      Hip external rotation      Knee flexion      Knee extension 19.2 17.3  Ankle dorsiflexion      Ankle plantarflexion      Ankle inversion      Ankle eversion       (Blank rows = not tested)   GAIT: Distance walked: In the clinic Assistive device utilized: Quad cane small base Level of assistance: Complete Independence Comments: Doesn't use the cane at times but doesn't trust her leg strength     TODAY'S TREATMENT:  05/26/22:    TherEx:  Nustep: level 4 x  6 minutes Calf stretch on slant board: x 2 holding 20 sec Step ups: x 10 each LE leading c  UE support Leg Press: bil LE's 43# 2 x 10 Sit to stand: x 10 Seated heel slides: x 15  Quad sets: towel under knee: x 10 holding 5 seconds SLR: x 10                                                                                                                 05/18/2022  Quadriceps sets 10X 5 seconds Seated straight leg raises 2 sets of 5 for 3 seconds Tailgate knee flexion 2 minutes   PATIENT EDUCATION:  Education details: Reviewed exam findings and HEP Person educated: Patient Education method: Explanation, Demonstration, Tactile cues, Verbal cues, and Handouts Education comprehension: verbalized understanding, returned demonstration, verbal cues required, tactile cues required, and needs further education   HOME EXERCISE PROGRAM: Access Code: [email or web address] URL: [email or web address] Date: 05/18/2022 Prepared by:  Vista Mink   Exercises - Supine Quadricep Sets  - 3-5 x daily - 7 x weekly - 2-3 sets - 10 reps - 5 second hold - Seated Knee Flexion AAROM  - 3 x daily - 7 x weekly - 1 sets - 1 reps - 3 minutes hold - Small Range Straight Leg Raise  - 3-5 x daily - 7 x weekly - 2 sets - 5 reps - 3 seconds hold   ASSESSMENT:   CLINICAL IMPRESSION: Pt tolerating quad strengthening and ROM exercises well with no reports of increased pain during session. Pt's HEP was reviewed and pt able to return demonstration. Pt has met 2 of her STG's set at initial eval. Pt has improved her left knee AROM to >/= 110 degrees. Continue with skilled PT to maximize pt's function.    OBJECTIVE IMPAIRMENTS: Abnormal gait, decreased activity tolerance, decreased endurance, decreased knowledge of condition, difficulty walking, decreased ROM, decreased strength, increased edema, and pain.    ACTIVITY LIMITATIONS: carrying, lifting, bending, sitting, standing, squatting, stairs, bed mobility, and locomotion level   PARTICIPATION LIMITATIONS: driving, shopping, and community activity   PERSONAL  FACTORS: Type 2 DM, HLD, HTN, osteoporosis are also affecting patient's functional outcome.    REHAB POTENTIAL: Good   CLINICAL DECISION MAKING: Stable/uncomplicated   EVALUATION COMPLEXITY: Low     GOALS: Goals reviewed with patient? Yes   SHORT TERM GOALS: Target date: 07/13/2022 Rishita will improve her bilateral quadriceps strength to at least 30 pounds Baseline: Less than 20 pounds Goal status: On-going: 05/26/22   2.  Bentleigh will improve her left knee flexion active range of motion to at least 110 degrees Baseline: 90 degrees Goal status: MET 05/26/22   3.  Philicia will demonstrate independence with her day 1 home exercise program Baseline: Started 1120/2023 Goal status: MET 05/26/22       LONG TERM GOALS: Target date: 07/13/2021   Improve Foto to 63 Baseline: 59 Goal status: INITIAL   2.  Cleva will report bilateral knee pain consistently 0-4 out of 10 on the visual analog scale (VAS) Baseline: 3-7 out of 10 Goal status: INITIAL   3.  Improve bilateral quadricep strength to at least 50 pounds bilaterally Baseline: Less than 20 pounds Goal status: INITIAL   4.  Improve bilateral knee flexion active range of motion to 125 degrees and extension to 0 degrees Baseline: 90/124 and 0/-2 Goal status: INITIAL   5.  Girlie will be independent with her long-term home exercise program at discharge Baseline: Started 05/18/2022 Goal status: INITIAL   PLAN:   PT FREQUENCY: 1-2x/week   PT DURATION: 8 weeks   PLANNED INTERVENTIONS: Therapeutic exercises, Therapeutic activity, Neuromuscular re-education, Balance training, Gait training, Patient/Family education, Self Care, Stair training, Cryotherapy, and Vasopneumatic device   PLAN FOR NEXT SESSION:  progress quadriceps strength, implement balance and functional activities as appropriate.    Oretha Caprice, PT, MPT 05/26/2022, 9:23 AM

## 2022-05-28 ENCOUNTER — Other Ambulatory Visit: Payer: Self-pay | Admitting: Family

## 2022-05-28 DIAGNOSIS — R058 Other specified cough: Secondary | ICD-10-CM

## 2022-05-29 ENCOUNTER — Encounter: Payer: Self-pay | Admitting: Physical Therapy

## 2022-05-29 ENCOUNTER — Ambulatory Visit (INDEPENDENT_AMBULATORY_CARE_PROVIDER_SITE_OTHER): Payer: Medicare Other | Admitting: Physical Therapy

## 2022-05-29 DIAGNOSIS — M25661 Stiffness of right knee, not elsewhere classified: Secondary | ICD-10-CM | POA: Diagnosis not present

## 2022-05-29 DIAGNOSIS — M25662 Stiffness of left knee, not elsewhere classified: Secondary | ICD-10-CM

## 2022-05-29 DIAGNOSIS — R262 Difficulty in walking, not elsewhere classified: Secondary | ICD-10-CM | POA: Diagnosis not present

## 2022-05-29 DIAGNOSIS — M25562 Pain in left knee: Secondary | ICD-10-CM | POA: Diagnosis not present

## 2022-05-29 DIAGNOSIS — R600 Localized edema: Secondary | ICD-10-CM | POA: Diagnosis not present

## 2022-05-29 DIAGNOSIS — M25561 Pain in right knee: Secondary | ICD-10-CM

## 2022-05-29 NOTE — Therapy (Signed)
OUTPATIENT PHYSICAL THERAPY TREATMENT NOTE   Patient Name: Amy Escobar MRN: [id number] DOB:[birth year], 76 y.o., female Today's Date: 05/29/2022  PCP: [name], FNP  REFERRING PROVIDER: Mcarthur Rossetti, MD   END OF SESSION:   PT End of Session - 05/29/22 0810     Visit Number 3    Number of Visits 16    Progress Note Due on Visit 10    PT Start Time 0803    PT Stop Time 8315    PT Time Calculation (min) 39 min    Activity Tolerance Patient tolerated treatment well    Behavior During Therapy WFL for tasks assessed/performed              Past Medical History:  Diagnosis Date   Alkaline phosphatase deficiency    w/u Ne   Allergic rhinitis    Allergy    Anxiety    Arthritis    Cataract    BILATERAL-REMOVED   DM2 (diabetes mellitus, type 2) ([place])    GERD (gastroesophageal reflux disease)    Gout    Hemorrhoids    Hyperlipidemia    Hypertension    Mild pulmonary hypertension ([place])    NSVT (nonsustained ventricular tachycardia) ([place])    Osteoporosis    PVC's (premature ventricular contractions)    Thyroid nodule    small   Tubular adenoma of colon 2017   Urticaria    Uterine fibroid    Past Surgical History:  Procedure Laterality Date   CATARACT EXTRACTION Bilateral 2016   COLONOSCOPY  2007   echocardiogram (other)  01/16/2002   POLYPECTOMY     removed tumors from foot nerves  04/1999   stress cardiolite  02/12/2006   TOENAIL EXCISION     TUBAL LIGATION     TYMPANOSTOMY TUBE PLACEMENT     Patient Active Problem List   Diagnosis Date Noted   Type 2 diabetes mellitus with diabetic polyneuropathy, without long-term current use of insulin ([place]) 06/18/2021   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 without macular edema, without long-term current use of insulin ([place]) 06/18/2021   Diabetes mellitus ([place]) 06/18/2021   Osteoarthritis of right AC (acromioclavicular) joint 03/06/2021    Osteoarthritis of right glenohumeral joint 03/06/2021   Foot pain, bilateral 12/26/2020   Diabetic neuropathy ([place]) 01/10/2020   Hav (hallux abducto valgus), unspecified laterality 01/10/2020   Chronic arthropathy 01/10/2020   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 edema associated with type 2 diabetes mellitus ([place]) 12/26/2019   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   AC (acromioclavicular) arthritis 08/02/2019   Unspecified inflammatory spondylopathy, 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[place]) 04/19/2019   Claudication ([place]) 04/19/2019   Morbid obesity ([place]) 04/19/2019   Suspected COVID-19 virus infection 04/13/2019   Upper airway cough syndrome 08/22/2018   Greater trochanteric bursitis of both hips 06/15/2018   Trigger point of shoulder region, left 02/07/2018   Hypertension    Mild pulmonary hypertension ([facility])    NSVT (nonsustained ventricular tachycardia) ([facility])    PVC's (premature ventricular contractions)    URI (upper respiratory infection) 08/09/2016   Neck pain 02/10/2016   Urinary incontinence 02/10/2016   Degenerative arthritis of knee, bilateral 07/17/2015   Knee MCL sprain 06/07/2015   Discomfort in chest 02/15/2015   Chest pain 02/15/2015   DM type 2, controlled, with complication ([place])    Wellness examination 08/06/2014   Acute meniscal tear of knee 02/13/2014   Primary localized osteoarthrosis, lower leg  02/13/2014   Gastrocnemius tear 12/25/2013   UTI (urinary tract infection) 12/20/2013   Right knee pain 12/20/2013   Pulmonary hypertension ([place]) 10/06/2013   DOE (dyspnea on exertion) 08/04/2013   Dysphagia, unspecified(787.20) 08/10/2012   Hip pain, left 02/02/2012   Painful respiration 05/25/2011   Encounter for long-term (current) use of other medications 01/30/2011   PELVIC PAIN, LEFT 07/30/2010   TOBACCO USE, QUIT 07/07/2010   FATIGUE 12/20/2009   Headache 12/20/2009   Low back pain 12/26/2008   Disorder of liver 04/13/2008    FOOT PAIN, BILATERAL 04/13/2008   FIBROIDS, UTERUS 11/24/2007   THYROID NODULE, LEFT 11/24/2007   HYPERCHOLESTEROLEMIA 11/24/2007   CARPAL TUNNEL SYNDROME, BILATERAL 11/24/2007   ALKALINE PHOSPHATASE, ELEVATED 11/24/2007   Gout 08/10/2007   ANXIETY 08/10/2007   Essential hypertension 08/10/2007   Cough 08/01/2007   Perennial and seasonal allergic rhinitis 01/14/2007   GERD 01/14/2007   Osteoporosis 01/14/2007    REFERRING DIAG:  M25.562,G89.29 (ICD-10-CM) - Chronic pain of left knee  M25.561,G89.29 (ICD-10-CM) - Chronic pain of right knee  S83.232D (ICD-10-CM) - Complex tear of medial meniscus of left knee as current injury, subsequent encounter    THERAPY DIAG:  Difficulty in walking, not elsewhere classified  Localized edema  Stiffness of left knee, not elsewhere classified  Stiffness of right knee, not elsewhere classified  Pain in both knees, unspecified chronicity  Rationale for Evaluation and Treatment Rehabilitation  PERTINENT HISTORY: Type 2 DM, HLD, HTN, osteoporosis   PRECAUTIONS: none  SUBJECTIVE:                                                                                                                                                                                      SUBJECTIVE STATEMENT:    Nothing new going on this morning, I don't have my cane and it seems to be going OK.    PAIN:  Are you having pain? Yes, left knee 4/10, Rt knee 6/10 Description: constant pain Location: B knees Aggravating factors: exercising Relieving factors: ice    OBJECTIVE: (objective measures completed at initial evaluation unless otherwise dated) DIAGNOSTIC FINDINGS:    Left knee IMPRESSION: 1. Root tear of the posterior horn of the medial meniscus with resulting mild peripheral extrusion of the meniscus from the joint space. 2. Mild medial and patellofemoral compartment degenerative chondrosis. No acute osseous findings. 3. Intact lateral meniscus,  cruciate and collateral ligaments. 4. Heterogeneous, serpiginous T2 hyperintensity centrally and laterally in Hoffa's fat may reflect a venous malformation or atypical ganglion.   Right knee IMPRESSION: 1. Chronic root tear of the posterior horn of the medial meniscus with progressive peripheral extrusion of the meniscus from  the joint space. 2. Progressive medial compartment osteoarthritis. No acute osseous findings. 3. The lateral meniscus, cruciate and collateral ligaments are intact. 4. Small joint effusion and small Baker's cyst.   PATIENT SURVEYS:  FOTO 59 (Goal 63 in 12 visits)   COGNITION: Overall cognitive status: Within functional limits for tasks assessed                         SENSATION: No complaints of peripheral paresthesias, new tingling or numbness   EDEMA:  Noted and not objectively assessed for bilateral knees   LOWER EXTREMITY ROM:   Active ROM Right eval Left eval Rt/Left 05/26/22  Hip flexion       Hip extension       Hip abduction       Hip adduction       Hip internal rotation       Hip external rotation       Knee flexion 124 90 122/110  Knee extension -2 0 0/0  Ankle dorsiflexion       Ankle plantarflexion       Ankle inversion       Ankle eversion        (Blank rows = not tested)   LOWER EXTREMITY STRENGTH:   Strength in pounds assessed by hand-held dynamometer Right eval Left eval  Hip flexion      Hip extension      Hip abduction      Hip adduction      Hip internal rotation      Hip external rotation      Knee flexion      Knee extension 19.2 17.3  Ankle dorsiflexion      Ankle plantarflexion      Ankle inversion      Ankle eversion       (Blank rows = not tested)   GAIT: Distance walked: In the clinic Assistive device utilized: Quad cane small base Level of assistance: Complete Independence Comments: Doesn't use the cane at times but doesn't trust her leg strength     TODAY'S TREATMENT:     05/29/22  TherEx  SciFit bike L5 x6 minutes seat 9 Forward and lateral step ups x10 B 6 inch step light BUE support  HS stretch from chair 2x30 seconds B  Piriformis stretch from chair 2x30 seconds B Staggered bridges x10 B  Sidelying hip ABD x10 B   NMR  Tandem stance 3x30 seconds B foam pad min guard   05/26/22:    TherEx:  Nustep: level 4 x  6 minutes Calf stretch on slant board: x 2 holding 20 sec Step ups: x 10 each LE leading c UE support Leg Press: bil LE's 43# 2 x 10 Sit to stand: x 10 Seated heel slides: x 15  Quad sets: towel under knee: x 10 holding 5 seconds SLR: x 10  05/18/2022  Quadriceps sets 10X 5 seconds Seated straight leg raises 2 sets of 5 for 3 seconds Tailgate knee flexion 2 minutes   PATIENT EDUCATION:  Education details: exercise form and purpose, walking program or even consider walking in the pool to take stress off of knees   Person educated: Patient Education method: Explanation : verbalized understanding, returned demonstration, verbal cues required, tactile cues required, and needs further education   HOME EXERCISE PROGRAM: Access Code: [email or web address] URL: [email or web address] Date: 05/18/2022 Prepared by: Vista Mink   Exercises - Supine Quadricep Sets  - 3-5 x daily - 7 x weekly - 2-3 sets - 10 reps - 5 second hold - Seated Knee Flexion AAROM  - 3 x daily - 7 x weekly - 1 sets - 1 reps - 3 minutes hold - Small Range Straight Leg Raise  - 3-5 x daily - 7 x weekly - 2 sets - 5 reps - 3 seconds hold   ASSESSMENT:   CLINICAL IMPRESSION:  Ms. Deanda arrives today doing OK, sounds like she has been doing alright walking without her cane recently. Encouraged walking program within pain limits, also consider walking in the pool if pain is an issue when she is walking over ground. Needed a lot of encouragement this  morning, often stated "I'm tired" during basic exercises. Warmed up on Scifit bike then progressed all activities as able and tolerated. Will continue efforts.    OBJECTIVE IMPAIRMENTS: Abnormal gait, decreased activity tolerance, decreased endurance, decreased knowledge of condition, difficulty walking, decreased ROM, decreased strength, increased edema, and pain.    ACTIVITY LIMITATIONS: carrying, lifting, bending, sitting, standing, squatting, stairs, bed mobility, and locomotion level   PARTICIPATION LIMITATIONS: driving, shopping, and community activity   PERSONAL FACTORS: Type 2 DM, HLD, HTN, osteoporosis are also affecting patient's functional outcome.    REHAB POTENTIAL: Good   CLINICAL DECISION MAKING: Stable/uncomplicated   EVALUATION COMPLEXITY: Low     GOALS: Goals reviewed with patient? Yes   SHORT TERM GOALS: Target date: 07/13/2022 Elzabeth will improve her bilateral quadriceps strength to at least 30 pounds Baseline: Less than 20 pounds Goal status: On-going: 05/26/22   2.  Kristalynn will improve her left knee flexion active range of motion to at least 110 degrees Baseline: 90 degrees Goal status: MET 05/26/22   3.  Maira will demonstrate independence with her day 1 home exercise program Baseline: Started 1120/2023 Goal status: MET 05/26/22       LONG TERM GOALS: Target date: 07/13/2021   Improve Foto to 63 Baseline: 59 Goal status: INITIAL   2.  Janey will report bilateral knee pain consistently 0-4 out of 10 on the visual analog scale (VAS) Baseline: 3-7 out of 10 Goal status: INITIAL   3.  Improve bilateral quadricep strength to at least 50 pounds bilaterally Baseline: Less than 20 pounds Goal status: INITIAL   4.  Improve bilateral knee flexion active range of motion to 125 degrees and extension to 0 degrees Baseline: 90/124 and 0/-2 Goal status: INITIAL   5.  Mata will be independent with her long-term home exercise program at discharge Baseline: Started  05/18/2022 Goal status: INITIAL   PLAN:   PT FREQUENCY: 1-2x/week   PT DURATION: 8 weeks   PLANNED INTERVENTIONS: Therapeutic exercises, Therapeutic activity, Neuromuscular re-education, Balance training, Gait training, Patient/Family education, Self Care, Stair training, Cryotherapy, and Vasopneumatic device   PLAN FOR NEXT SESSION:  progress quadriceps strength, implement balance and functional activities as appropriate.    Ann Lions  PT DPT PN2  05/29/2022, 8:42 AM

## 2022-06-01 NOTE — Congregational Nurse Program (Signed)
Brief encounter at [facility].  Walking better this Sunday.  Vinnie Langton, RN, [place] Nurse, [phone].

## 2022-06-02 NOTE — Progress Notes (Signed)
[facility] Quality Team Note  Name: Amy Escobar Date of Birth: February 19, 1946 MRN: [id number] Date: 06/02/2022  [facility] Quality Team has reviewed this patient's chart, please see recommendations below:  [facility] Quality Other; (KED: KIDNEY HEALTH EVALUATION GAP- PATIENT NEEDS URINE MICROALBUMIN CREATININE RATIO FOR GAP CLOSURE)

## 2022-06-03 ENCOUNTER — Encounter: Payer: Self-pay | Admitting: Physical Therapy

## 2022-06-03 ENCOUNTER — Ambulatory Visit (INDEPENDENT_AMBULATORY_CARE_PROVIDER_SITE_OTHER): Payer: Medicare Other | Admitting: Physical Therapy

## 2022-06-03 ENCOUNTER — Ambulatory Visit (INDEPENDENT_AMBULATORY_CARE_PROVIDER_SITE_OTHER): Payer: Medicare Other | Admitting: Orthopaedic Surgery

## 2022-06-03 ENCOUNTER — Encounter: Payer: Self-pay | Admitting: Orthopaedic Surgery

## 2022-06-03 DIAGNOSIS — M25662 Stiffness of left knee, not elsewhere classified: Secondary | ICD-10-CM | POA: Diagnosis not present

## 2022-06-03 DIAGNOSIS — R600 Localized edema: Secondary | ICD-10-CM | POA: Diagnosis not present

## 2022-06-03 DIAGNOSIS — M25561 Pain in right knee: Secondary | ICD-10-CM | POA: Diagnosis not present

## 2022-06-03 DIAGNOSIS — M25661 Stiffness of right knee, not elsewhere classified: Secondary | ICD-10-CM | POA: Diagnosis not present

## 2022-06-03 DIAGNOSIS — M7052 Other bursitis of knee, left knee: Secondary | ICD-10-CM

## 2022-06-03 DIAGNOSIS — R262 Difficulty in walking, not elsewhere classified: Secondary | ICD-10-CM | POA: Diagnosis not present

## 2022-06-03 DIAGNOSIS — M25562 Pain in left knee: Secondary | ICD-10-CM | POA: Diagnosis not present

## 2022-06-03 DIAGNOSIS — M7051 Other bursitis of knee, right knee: Secondary | ICD-10-CM | POA: Diagnosis not present

## 2022-06-03 MED ORDER — LIDOCAINE HCL 1 % IJ SOLN
1.0000 mL | INTRAMUSCULAR | Status: AC | PRN
  Administered 2022-06-03: 1 mL

## 2022-06-03 MED ORDER — METHYLPREDNISOLONE ACETATE 40 MG/ML IJ SUSP
20.0000 mg | INTRAMUSCULAR | Status: AC | PRN
  Administered 2022-06-03: 20 mg via INTRAMUSCULAR

## 2022-06-03 NOTE — Progress Notes (Signed)
   Procedure Note  Patient: Amy Escobar             Date of Birth: [birth year]           MRN: [id number]             Visit Date: 06/03/2022 HPI: Amy Escobar returns today for follow-up of her bilateral knee pain.  She states that physical therapy has helped quite a bit.  She is no longer walking with a cane.  She does still have some painful popping particularly in the left knee.  Currently her right knee is bothering her more than the left.  She is having pain in the anterior aspect of both knees.  Again MRI of her knees show meniscal root tears bilaterally and also mild arthritic changes.  She denies any fevers chills.  Diabetes under good control.  Review systems: See HPI otherwise negative  Physical exam: Bilateral knees no abnormal warmth erythema.  Good range of motion of both knees without patellofemoral crepitus.  Tenderness over the pes anserinus region of both knees.  Procedures: Visit Diagnoses:  1. Pes anserinus bursitis of both knees     Trigger Point Inj  Date/Time: 06/03/2022 10:17 AM  Performed by: Pete Pelt, PA-C Authorized by: Pete Pelt, PA-C   Indications:  Pain Total # of Trigger Points:  2 Location: lower extremity   Needle Size:  25 G Approach:  Medial Medications #1:  1 mL lidocaine 1 %; 20 mg methylPREDNISolone acetate 40 MG/ML Medications #2:  1 mL lidocaine 1 %; 20 mg methylPREDNISolone acetate 40 MG/ML    Plan continue to work on quad strengthening and range of motion both knees with physical therapy.  Include hamstring stretching and modalities to the pes anserinus region bilaterally.  She will monitor her glucose levels closely over the next 24 to 48 hours.  Follow-up with [place] in 8 weeks be mindful of any mechanical symptoms and also what type of relief she had with the pes anserinus injections.  Questions were encouraged

## 2022-06-03 NOTE — Therapy (Signed)
OUTPATIENT PHYSICAL THERAPY TREATMENT NOTE   Patient Name: Amy Escobar MRN: [id number] DOB:16-Feb-1946, 76 y.o., female Today's Date: 06/03/2022  PCP: [name], FNP  REFERRING PROVIDER: Mcarthur Rossetti, MD   END OF SESSION:   PT End of Session - 06/03/22 0904     Visit Number 4    Number of Visits 16    Progress Note Due on Visit 10    PT Start Time 0848    PT Stop Time 0928    PT Time Calculation (min) 40 min    Activity Tolerance Patient tolerated treatment well    Behavior During Therapy WFL for tasks assessed/performed               Past Medical History:  Diagnosis Date   Alkaline phosphatase deficiency    w/u Ne   Allergic rhinitis    Allergy    Anxiety    Arthritis    Cataract    BILATERAL-REMOVED   DM2 (diabetes mellitus, type 2) ([place])    GERD (gastroesophageal reflux disease)    Gout    Hemorrhoids    Hyperlipidemia    Hypertension    Mild pulmonary hypertension (HCC)    NSVT (nonsustained ventricular tachycardia) ([place])    Osteoporosis    PVC's (premature ventricular contractions)    Thyroid nodule    small   Tubular adenoma of colon 2017   Urticaria    Uterine fibroid    Past Surgical History:  Procedure Laterality Date   CATARACT EXTRACTION Bilateral 2016   COLONOSCOPY  2007   echocardiogram (other)  01/16/2002   POLYPECTOMY     removed tumors from foot nerves  04/1999   stress cardiolite  02/12/2006   TOENAIL EXCISION     TUBAL LIGATION     TYMPANOSTOMY TUBE PLACEMENT     Patient Active Problem List   Diagnosis Date Noted   Type 2 diabetes mellitus with diabetic polyneuropathy, without long-term current use of insulin ([place]) 06/18/2021   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 without macular edema, without long-term current use of insulin ([place]) 06/18/2021   Diabetes mellitus ([place]) 06/18/2021   Osteoarthritis of right AC (acromioclavicular) joint 03/06/2021    Osteoarthritis of right glenohumeral joint 03/06/2021   Foot pain, bilateral 12/26/2020   Diabetic neuropathy ([place]) 01/10/2020   Hav (hallux abducto valgus), unspecified laterality 01/10/2020   Chronic arthropathy 01/10/2020   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 edema associated with type 2 diabetes mellitus ([place]) 12/26/2019   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   AC (acromioclavicular) arthritis 08/02/2019   Unspecified inflammatory spondylopathy, 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[place]) 04/19/2019   Claudication ([place]) 04/19/2019   Morbid obesity ([place]) 04/19/2019   Suspected COVID-19 virus infection 04/13/2019   Upper airway cough syndrome 08/22/2018   Greater trochanteric bursitis of both hips 06/15/2018   Trigger point of shoulder region, left 02/07/2018   Hypertension    Mild pulmonary hypertension (HCC)    NSVT (nonsustained ventricular tachycardia) (HCC)    PVC's (premature ventricular contractions)    URI (upper respiratory infection) 08/09/2016   Neck pain 02/10/2016   Urinary incontinence 02/10/2016   Degenerative arthritis of knee, bilateral 07/17/2015   Knee MCL sprain 06/07/2015   Discomfort in chest 02/15/2015   Chest pain 02/15/2015   DM type 2, controlled, with complication ([place])    Wellness examination 08/06/2014   Acute meniscal tear of knee 02/13/2014   Primary localized osteoarthrosis, lower  leg 02/13/2014   Gastrocnemius tear 12/25/2013   UTI (urinary tract infection) 12/20/2013   Right knee pain 12/20/2013   Pulmonary hypertension ([place]) 10/06/2013   DOE (dyspnea on exertion) 08/04/2013   Dysphagia, unspecified(787.20) 08/10/2012   Hip pain, left 02/02/2012   Painful respiration 05/25/2011   Encounter for long-term (current) use of other medications 01/30/2011   PELVIC PAIN, LEFT 07/30/2010   TOBACCO USE, QUIT 07/07/2010   FATIGUE 12/20/2009   Headache 12/20/2009   Low back pain 12/26/2008   Disorder of liver 04/13/2008    FOOT PAIN, BILATERAL 04/13/2008   FIBROIDS, UTERUS 11/24/2007   THYROID NODULE, LEFT 11/24/2007   HYPERCHOLESTEROLEMIA 11/24/2007   CARPAL TUNNEL SYNDROME, BILATERAL 11/24/2007   ALKALINE PHOSPHATASE, ELEVATED 11/24/2007   Gout 08/10/2007   ANXIETY 08/10/2007   Essential hypertension 08/10/2007   Cough 08/01/2007   Perennial and seasonal allergic rhinitis 01/14/2007   GERD 01/14/2007   Osteoporosis 01/14/2007    REFERRING DIAG:  M25.562,G89.29 (ICD-10-CM) - Chronic pain of left knee  M25.561,G89.29 (ICD-10-CM) - Chronic pain of right knee  S83.232D (ICD-10-CM) - Complex tear of medial meniscus of left knee as current injury, subsequent encounter    THERAPY DIAG:  Difficulty in walking, not elsewhere classified  Localized edema  Stiffness of left knee, not elsewhere classified  Stiffness of right knee, not elsewhere classified  Pain in both knees, unspecified chronicity  Rationale for Evaluation and Treatment Rehabilitation  PERTINENT HISTORY: Type 2 DM, HLD, HTN, osteoporosis   PRECAUTIONS: none  SUBJECTIVE:                                                                                                                                                                                      SUBJECTIVE STATEMENT:    Pt arriving today reporting pain is 4/10 in bil legs. Pt believes that her pain has increased due to the cold weather.    PAIN:  Are you having pain? Yes, left knee 4/10,bil knees Description: constant pain Location: B knees Aggravating factors: exercising Relieving factors: ice    OBJECTIVE: (objective measures completed at initial evaluation unless otherwise dated) DIAGNOSTIC FINDINGS:    Left knee IMPRESSION: 1. Root tear of the posterior horn of the medial meniscus with resulting mild peripheral extrusion of the meniscus from the joint space. 2. Mild medial and patellofemoral compartment degenerative chondrosis. No acute osseous findings. 3.  Intact lateral meniscus, cruciate and collateral ligaments. 4. Heterogeneous, serpiginous T2 hyperintensity centrally and laterally in Hoffa's fat may reflect a venous malformation or atypical ganglion.   Right knee IMPRESSION: 1. 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 2. Progressive medial compartment osteoarthritis. No acute osseous findings. 3. The lateral meniscus, cruciate and collateral ligaments are intact. 4. Small joint effusion and small Baker's cyst.   PATIENT SURVEYS:  FOTO 59 (Goal 63 in 12 visits)   COGNITION: Overall cognitive status: Within functional limits for tasks assessed                         SENSATION: No complaints of peripheral paresthesias, new tingling or numbness   EDEMA:  Noted and not objectively assessed for bilateral knees   LOWER EXTREMITY ROM:   Active ROM Right eval Left eval Rt/Left 05/26/22 Rt/left 06/03/22  Hip flexion        Hip extension        Hip abduction        Hip adduction        Hip internal rotation        Hip external rotation        Knee flexion 124 90 122/110 122/114  Knee extension -2 0 0/0 0/0  Ankle dorsiflexion        Ankle plantarflexion        Ankle inversion        Ankle eversion         (Blank rows = not tested)   LOWER EXTREMITY STRENGTH:   Strength in pounds assessed by hand-held dynamometer Right eval Left eval  Hip flexion      Hip extension      Hip abduction      Hip adduction      Hip internal rotation      Hip external rotation      Knee flexion      Knee extension 19.2 17.3  Ankle dorsiflexion      Ankle plantarflexion      Ankle inversion      Ankle eversion       (Blank rows = not tested)   GAIT: Distance walked: In the clinic Assistive device utilized: Quad cane small base Level of assistance: Complete Independence Comments: Doesn't use the cane at times but doesn't trust her  leg strength     TODAY'S TREATMENT:  06/03/22: TherEx Nustep: level 5 x 6 minutes Calf stretch on slant board: x 3 holding 20 sec Heel raises: x 15 UE support HS stretch from chair 2x30 seconds  Piriformis stretch from chair 2x30 seconds Bridges:  2 x 10 holding 5 sec Sidelying hip ABD x10  NMR Standing on Airex: x 30 sec with intermittent UE support Tapping on 4 inch step x 20  Side steppping with UE support 6 feet x 4   05/29/22 TherEx  SciFit bike L5 x6 minutes seat 9 Forward and lateral step ups x10 B 6 inch step light BUE support  HS stretch from chair 2x30 seconds B  Piriformis stretch from chair 2x30 seconds B Staggered bridges x10 B  Sidelying hip ABD x10 B  NMR Tandem stance 3x30 seconds B foam pad min guard   05/26/22:    TherEx:  Nustep: level 4 x  6 minutes Calf stretch on slant board: x 2 holding 20 sec Step ups: x 10 each LE leading c UE support Leg Press: bil LE's 43# 2 x 10 Sit to stand: x 10 Seated heel slides: x 15  Quad sets: towel under knee: x 10 holding 5 seconds SLR: x 10  PATIENT EDUCATION:  Education details: exercise form and purpose, walking program or even consider walking in the pool to take stress off of knees   Person educated: Patient Education method: Explanation : verbalized understanding, returned demonstration, verbal cues required, tactile cues required, and needs further education   HOME EXERCISE PROGRAM: Access Code: [email or web address] URL: [email or web address] Date: 05/18/2022 Prepared by: Vista Mink   Exercises - Supine Quadricep Sets  - 3-5 x daily - 7 x weekly - 2-3 sets - 10 reps - 5 second hold - Seated Knee Flexion AAROM  - 3 x daily - 7 x weekly - 1 sets - 1 reps - 3 minutes hold - Small Range Straight Leg Raise  - 3-5 x daily - 7 x weekly - 2 sets - 5 reps - 3 seconds hold   ASSESSMENT:    CLINICAL IMPRESSION: Pt tolerating exercises well with rest breaks as needed. Focus on LE strengthening and dynamic balance. Continue progressing toward LTG's.     OBJECTIVE IMPAIRMENTS: Abnormal gait, decreased activity tolerance, decreased endurance, decreased knowledge of condition, difficulty walking, decreased ROM, decreased strength, increased edema, and pain.    ACTIVITY LIMITATIONS: carrying, lifting, bending, sitting, standing, squatting, stairs, bed mobility, and locomotion level   PARTICIPATION LIMITATIONS: driving, shopping, and community activity   PERSONAL FACTORS: Type 2 DM, HLD, HTN, osteoporosis are also affecting patient's functional outcome.    REHAB POTENTIAL: Good   CLINICAL DECISION MAKING: Stable/uncomplicated   EVALUATION COMPLEXITY: Low     GOALS: Goals reviewed with patient? Yes   SHORT TERM GOALS: Target date: 07/13/2022 Shonda will improve her bilateral quadriceps strength to at least 30 pounds Baseline: Less than 20 pounds Goal status: On-going: 05/26/22   2.  Pollyann will improve her left knee flexion active range of motion to at least 110 degrees Baseline: 90 degrees Goal status: MET 05/26/22   3.  Kodi will demonstrate independence with her day 1 home exercise program Baseline: Started 1120/2023 Goal status: MET 05/26/22       LONG TERM GOALS: Target date: 07/13/2021   Improve Foto to 63 Baseline: 59 Goal status: INITIAL   2.  Anthonette will report bilateral knee pain consistently 0-4 out of 10 on the visual analog scale (VAS) Baseline: 3-7 out of 10 Goal status: INITIAL   3.  Improve bilateral quadricep strength to at least 50 pounds bilaterally Baseline: Less than 20 pounds Goal status: INITIAL   4.  Improve bilateral knee flexion active range of motion to 125 degrees and extension to 0 degrees Baseline: 90/124 and 0/-2 Goal status: INITIAL   5.  Andreya will be independent with her long-term home exercise program at discharge Baseline:  Started 05/18/2022 Goal status: INITIAL   PLAN:   PT FREQUENCY: 1-2x/week   PT DURATION: 8 weeks   PLANNED INTERVENTIONS: Therapeutic exercises, Therapeutic activity, Neuromuscular re-education, Balance training, Gait training, Patient/Family education, Self Care, Stair training, Cryotherapy, and Vasopneumatic device   PLAN FOR NEXT SESSION:  progress quadriceps strength, implement balance and functional activities as appropriate.    Kearney Hard, PT, MPT 06/03/22 9:15 AM   06/03/2022, 9:15 AM

## 2022-06-04 NOTE — Therapy (Signed)
OUTPATIENT PHYSICAL THERAPY TREATMENT NOTE   Patient Name: Amy Escobar MRN: [id number] DOB:[birth year], 76 y.o., female Today's Date: 06/05/2022  PCP: [name], FNP  REFERRING PROVIDER: Mcarthur Rossetti, MD   END OF SESSION:   PT End of Session - 06/05/22 0756     Visit Number 5    Number of Visits 16    Progress Note Due on Visit 10    PT Start Time 0800    PT Stop Time 0840    PT Time Calculation (min) 40 min    Activity Tolerance Patient tolerated treatment well    Behavior During Therapy WFL for tasks assessed/performed                Past Medical History:  Diagnosis Date   Alkaline phosphatase deficiency    w/u Ne   Allergic rhinitis    Allergy    Anxiety    Arthritis    Cataract    BILATERAL-REMOVED   DM2 (diabetes mellitus, type 2) ([place])    GERD (gastroesophageal reflux disease)    Gout    Hemorrhoids    Hyperlipidemia    Hypertension    Mild pulmonary hypertension ([facility])    NSVT (nonsustained ventricular tachycardia) ([place])    Osteoporosis    PVC's (premature ventricular contractions)    Thyroid nodule    small   Tubular adenoma of colon 2017   Urticaria    Uterine fibroid    Past Surgical History:  Procedure Laterality Date   CATARACT EXTRACTION Bilateral 2016   COLONOSCOPY  2007   echocardiogram (other)  01/16/2002   POLYPECTOMY     removed tumors from foot nerves  04/1999   stress cardiolite  02/12/2006   TOENAIL EXCISION     TUBAL LIGATION     TYMPANOSTOMY TUBE PLACEMENT     Patient Active Problem List   Diagnosis Date Noted   Type 2 diabetes mellitus with diabetic polyneuropathy, without long-term current use of insulin ([place]) 06/18/2021   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 without macular edema, without long-term current use of insulin ([place]) 06/18/2021   Diabetes mellitus ([place]) 06/18/2021   Osteoarthritis of right AC (acromioclavicular) joint 03/06/2021    Osteoarthritis of right glenohumeral joint 03/06/2021   Foot pain, bilateral 12/26/2020   Diabetic neuropathy ([place]) 01/10/2020   Hav (hallux abducto valgus), unspecified laterality 01/10/2020   Chronic arthropathy 01/10/2020   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 edema associated with type 2 diabetes mellitus ([place]) 12/26/2019   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   AC (acromioclavicular) arthritis 08/02/2019   Unspecified inflammatory spondylopathy, 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[place]) 04/19/2019   Claudication ([place]) 04/19/2019   Morbid obesity ([place]) 04/19/2019   Suspected COVID-19 virus infection 04/13/2019   Upper airway cough syndrome 08/22/2018   Greater trochanteric bursitis of both hips 06/15/2018   Trigger point of shoulder region, left 02/07/2018   Hypertension    Mild pulmonary hypertension ([facility])    NSVT (nonsustained ventricular tachycardia) ([facility])    PVC's (premature ventricular contractions)    URI (upper respiratory infection) 08/09/2016   Neck pain 02/10/2016   Urinary incontinence 02/10/2016   Degenerative arthritis of knee, bilateral 07/17/2015   Knee MCL sprain 06/07/2015   Discomfort in chest 02/15/2015   Chest pain 02/15/2015   DM type 2, controlled, with complication ([place])    Wellness examination 08/06/2014   Acute meniscal tear of knee 02/13/2014   Primary localized osteoarthrosis,  lower leg 02/13/2014   Gastrocnemius tear 12/25/2013   UTI (urinary tract infection) 12/20/2013   Right knee pain 12/20/2013   Pulmonary hypertension ([place]) 10/06/2013   DOE (dyspnea on exertion) 08/04/2013   Dysphagia, unspecified(787.20) 08/10/2012   Hip pain, left 02/02/2012   Painful respiration 05/25/2011   Encounter for long-term (current) use of other medications 01/30/2011   PELVIC PAIN, LEFT 07/30/2010   TOBACCO USE, QUIT 07/07/2010   FATIGUE 12/20/2009   Headache 12/20/2009   Low back pain 12/26/2008   Disorder of liver 04/13/2008    FOOT PAIN, BILATERAL 04/13/2008   FIBROIDS, UTERUS 11/24/2007   THYROID NODULE, LEFT 11/24/2007   HYPERCHOLESTEROLEMIA 11/24/2007   CARPAL TUNNEL SYNDROME, BILATERAL 11/24/2007   ALKALINE PHOSPHATASE, ELEVATED 11/24/2007   Gout 08/10/2007   ANXIETY 08/10/2007   Essential hypertension 08/10/2007   Cough 08/01/2007   Perennial and seasonal allergic rhinitis 01/14/2007   GERD 01/14/2007   Osteoporosis 01/14/2007    REFERRING DIAG:  M25.562,G89.29 (ICD-10-CM) - Chronic pain of left knee  M25.561,G89.29 (ICD-10-CM) - Chronic pain of right knee  S83.232D (ICD-10-CM) - Complex tear of medial meniscus of left knee as current injury, subsequent encounter    THERAPY DIAG:  Difficulty in walking, not elsewhere classified  Localized edema  Stiffness of left knee, not elsewhere classified  Stiffness of right knee, not elsewhere classified  Pain in both knees, unspecified chronicity  Rationale for Evaluation and Treatment Rehabilitation  PERTINENT HISTORY: Type 2 DM, HLD, HTN, osteoporosis   PRECAUTIONS: none  SUBJECTIVE:                                                                                                                                                                                      SUBJECTIVE STATEMENT:   Pt states that she got cortisone shots in her bilat knees on Wednesday's. She reports improved pain with more pain in her R today than her L.    PAIN:  Are you having pain? Yes, left knee 4/10,bil knees Description: constant pain Location: B knees Aggravating factors: exercising Relieving factors: ice    OBJECTIVE: (objective measures completed at initial evaluation unless otherwise dated) DIAGNOSTIC FINDINGS:    Left knee IMPRESSION: 1. Root tear of the posterior horn of the medial meniscus with resulting mild peripheral extrusion of the meniscus from the joint space. 2. Mild medial and patellofemoral compartment degenerative chondrosis. No acute  osseous findings. 3. Intact lateral meniscus, cruciate and collateral ligaments. 4. Heterogeneous, serpiginous T2 hyperintensity centrally and laterally in Hoffa's fat may reflect a venous malformation or atypical ganglion.   Right knee IMPRESSION: 1. Chronic root tear of the posterior horn of the medial meniscus  with progressive peripheral extrusion of the meniscus from the joint space. 2. Progressive medial compartment osteoarthritis. No acute osseous findings. 3. The lateral meniscus, cruciate and collateral ligaments are intact. 4. Small joint effusion and small Baker's cyst.   PATIENT SURVEYS:  FOTO 59 (Goal 63 in 12 visits)   COGNITION: Overall cognitive status: Within functional limits for tasks assessed                         SENSATION: No complaints of peripheral paresthesias, new tingling or numbness   EDEMA:  Noted and not objectively assessed for bilateral knees   LOWER EXTREMITY ROM:   Active ROM Right eval Left eval Rt/Left 05/26/22 Rt/left 06/03/22  Hip flexion        Hip extension        Hip abduction        Hip adduction        Hip internal rotation        Hip external rotation        Knee flexion 124 90 122/110 122/114  Knee extension -2 0 0/0 0/0  Ankle dorsiflexion        Ankle plantarflexion        Ankle inversion        Ankle eversion         (Blank rows = not tested)   LOWER EXTREMITY STRENGTH:   Strength in pounds assessed by hand-held dynamometer Right eval Left eval  Hip flexion      Hip extension      Hip abduction      Hip adduction      Hip internal rotation      Hip external rotation      Knee flexion      Knee extension 19.2 17.3  Ankle dorsiflexion      Ankle plantarflexion      Ankle inversion      Ankle eversion       (Blank rows = not tested)   GAIT: Distance walked: In the clinic Assistive device utilized: Quad cane small base Level of assistance: Complete Independence Comments: Doesn't use the cane at times  but doesn't trust her leg strength     TODAY'S TREATMENT:  06/05/22: TherEx Nustep: level 5 x 6 minutes Calf stretch on slant board: x 3 holding 20 sec HS stretch from chair 2x30 seconds  Piriformis stretch from chair 2x30 seconds Bridges:  2 x 10 holding 5 sec Sidelying hip ABD x10  Leg Press Bil 32# x15, SL25# x10 each NMR Standing on Airex: x 30 sec with intermittent UE support Tapping on 4 inch step x 20  Lateral tapping on 4 inch step. Pt only able to tolerate 4x each side prior to pain.   06/03/22: TherEx Nustep: level 5 x 6 minutes Calf stretch on slant board: x 3 holding 20 sec Heel raises: x 15 UE support HS stretch from chair 2x30 seconds  Piriformis stretch from chair 2x30 seconds Bridges:  2 x 10 holding 5 sec Sidelying hip ABD x10  NMR Standing on Airex: x 30 sec with intermittent UE support Tapping on 4 inch step x 20  Side steppping with UE support 6 feet x 4   05/29/22 TherEx  SciFit bike L5 x6 minutes seat 9 Forward and lateral step ups x10 B 6 inch step light BUE support  HS stretch from chair 2x30 seconds B  Piriformis stretch from chair 2x30 seconds B Staggered bridges x10  B  Sidelying hip ABD x10 B  NMR Tandem stance 3x30 seconds B foam pad min guard   05/26/22:    TherEx:  Nustep: level 4 x  6 minutes Calf stretch on slant board: x 2 holding 20 sec Step ups: x 10 each LE leading c UE support Leg Press: bil LE's 43# 2 x 10 Sit to stand: x 10 Seated heel slides: x 15  Quad sets: towel under knee: x 10 holding 5 seconds SLR: x 10                                                                                                                    PATIENT EDUCATION:  Education details: exercise form and purpose, walking program or even consider walking in the pool to take stress off of knees   Person educated: Patient Education method: Explanation : verbalized understanding, returned demonstration, verbal cues required, tactile cues required,  and needs further education   HOME EXERCISE PROGRAM: Access Code: [email or web address] URL: [email or web address] Date: 05/18/2022 Prepared by: Vista Mink   Exercises - Supine Quadricep Sets  - 3-5 x daily - 7 x weekly - 2-3 sets - 10 reps - 5 second hold - Seated Knee Flexion AAROM  - 3 x daily - 7 x weekly - 1 sets - 1 reps - 3 minutes hold - Small Range Straight Leg Raise  - 3-5 x daily - 7 x weekly - 2 sets - 5 reps - 3 seconds hold   ASSESSMENT:   CLINICAL IMPRESSION: Pt tolerating exercises well with rest breaks as needed. She was limited with lateral step up's today due to reported pain in her knee. Challenged her with leg press today with no adverse effects. Session continued to focus on LE strengthening and dynamic balance. Continue progressing toward LTG's.     OBJECTIVE IMPAIRMENTS: Abnormal gait, decreased activity tolerance, decreased endurance, decreased knowledge of condition, difficulty walking, decreased ROM, decreased strength, increased edema, and pain.    ACTIVITY LIMITATIONS: carrying, lifting, bending, sitting, standing, squatting, stairs, bed mobility, and locomotion level   PARTICIPATION LIMITATIONS: driving, shopping, and community activity   PERSONAL FACTORS: Type 2 DM, HLD, HTN, osteoporosis are also affecting patient's functional outcome.    REHAB POTENTIAL: Good   CLINICAL DECISION MAKING: Stable/uncomplicated   EVALUATION COMPLEXITY: Low     GOALS: Goals reviewed with patient? Yes   SHORT TERM GOALS: Target date: 07/13/2022 [place] will improve her bilateral quadriceps strength to at least 30 pounds Baseline: Less than 20 pounds Goal status: On-going: 05/26/22   2.  Milah will improve her left knee flexion active range of motion to at least 110 degrees Baseline: 90 degrees Goal status: MET 05/26/22   3.  [place] will demonstrate independence with her day 1 home exercise program Baseline: Started 1120/2023 Goal status: MET 05/26/22        LONG TERM GOALS: Target date: 07/13/2021   Improve Foto to 63 Baseline: 59 Goal status: INITIAL   2.  Unknown will report bilateral  knee pain consistently 0-4 out of 10 on the visual analog scale (VAS) Baseline: 3-7 out of 10 Goal status: INITIAL   3.  Improve bilateral quadricep strength to at least 50 pounds bilaterally Baseline: Less than 20 pounds Goal status: INITIAL   4.  Improve bilateral knee flexion active range of motion to 125 degrees and extension to 0 degrees Baseline: 90/124 and 0/-2 Goal status: INITIAL   5.  Kayleana will be independent with her long-term home exercise program at discharge Baseline: Started 05/18/2022 Goal status: INITIAL   PLAN:   PT FREQUENCY: 1-2x/week   PT DURATION: 8 weeks   PLANNED INTERVENTIONS: Therapeutic exercises, Therapeutic activity, Neuromuscular re-education, Balance training, Gait training, Patient/Family education, Self Care, Stair training, Cryotherapy, and Vasopneumatic device   PLAN FOR NEXT SESSION:  progress quadriceps strength, implement balance and functional activities as appropriate.    Rudi Heap PT, DPT 06/05/22  8:36 AM

## 2022-06-05 ENCOUNTER — Encounter: Payer: Self-pay | Admitting: Physical Therapy

## 2022-06-05 ENCOUNTER — Ambulatory Visit (INDEPENDENT_AMBULATORY_CARE_PROVIDER_SITE_OTHER): Payer: Medicare Other | Admitting: Physical Therapy

## 2022-06-05 DIAGNOSIS — M25662 Stiffness of left knee, not elsewhere classified: Secondary | ICD-10-CM | POA: Diagnosis not present

## 2022-06-05 DIAGNOSIS — R262 Difficulty in walking, not elsewhere classified: Secondary | ICD-10-CM

## 2022-06-05 DIAGNOSIS — R600 Localized edema: Secondary | ICD-10-CM

## 2022-06-05 DIAGNOSIS — M25561 Pain in right knee: Secondary | ICD-10-CM | POA: Diagnosis not present

## 2022-06-05 DIAGNOSIS — M25562 Pain in left knee: Secondary | ICD-10-CM

## 2022-06-05 DIAGNOSIS — M25661 Stiffness of right knee, not elsewhere classified: Secondary | ICD-10-CM

## 2022-06-10 ENCOUNTER — Ambulatory Visit: Payer: Medicare Other | Admitting: Family Medicine

## 2022-06-15 ENCOUNTER — Encounter: Payer: Self-pay | Admitting: Physical Therapy

## 2022-06-15 ENCOUNTER — Ambulatory Visit (INDEPENDENT_AMBULATORY_CARE_PROVIDER_SITE_OTHER): Payer: Medicare Other | Admitting: Physical Therapy

## 2022-06-15 DIAGNOSIS — R262 Difficulty in walking, not elsewhere classified: Secondary | ICD-10-CM

## 2022-06-15 DIAGNOSIS — R600 Localized edema: Secondary | ICD-10-CM

## 2022-06-15 DIAGNOSIS — M25661 Stiffness of right knee, not elsewhere classified: Secondary | ICD-10-CM | POA: Diagnosis not present

## 2022-06-15 DIAGNOSIS — M25561 Pain in right knee: Secondary | ICD-10-CM | POA: Diagnosis not present

## 2022-06-15 DIAGNOSIS — M25562 Pain in left knee: Secondary | ICD-10-CM | POA: Diagnosis not present

## 2022-06-15 DIAGNOSIS — M25662 Stiffness of left knee, not elsewhere classified: Secondary | ICD-10-CM

## 2022-06-15 NOTE — Therapy (Signed)
OUTPATIENT PHYSICAL THERAPY TREATMENT NOTE   Patient Name: Amy Escobar MRN: [id number] DOB:January 05, 1946, 76 y.o., female Today's Date: 06/15/2022  PCP: [name], FNP  REFERRING PROVIDER: Mcarthur Rossetti, MD   END OF SESSION:   PT End of Session - 06/15/22 1212     Visit Number 6    Number of Visits 16    Progress Note Due on Visit 10    PT Start Time 4696    PT Stop Time 1230    PT Time Calculation (min) 35 min    Activity Tolerance Patient tolerated treatment well    Behavior During Therapy WFL for tasks assessed/performed                Past Medical History:  Diagnosis Date   Alkaline phosphatase deficiency    w/u Ne   Allergic rhinitis    Allergy    Anxiety    Arthritis    Cataract    BILATERAL-REMOVED   DM2 (diabetes mellitus, type 2) ([place])    GERD (gastroesophageal reflux disease)    Gout    Hemorrhoids    Hyperlipidemia    Hypertension    Mild pulmonary hypertension (HCC)    NSVT (nonsustained ventricular tachycardia) ([place])    Osteoporosis    PVC's (premature ventricular contractions)    Thyroid nodule    small   Tubular adenoma of colon 2017   Urticaria    Uterine fibroid    Past Surgical History:  Procedure Laterality Date   CATARACT EXTRACTION Bilateral 2016   COLONOSCOPY  2007   echocardiogram (other)  01/16/2002   POLYPECTOMY     removed tumors from foot nerves  04/1999   stress cardiolite  02/12/2006   TOENAIL EXCISION     TUBAL LIGATION     TYMPANOSTOMY TUBE PLACEMENT     Patient Active Problem List   Diagnosis Date Noted   Type 2 diabetes mellitus with diabetic polyneuropathy, without long-term current use of insulin ([place]) 06/18/2021   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 without macular edema, without long-term current use of insulin ([place]) 06/18/2021   Diabetes mellitus ([place]) 06/18/2021   Osteoarthritis of right AC (acromioclavicular) joint 03/06/2021    Osteoarthritis of right glenohumeral joint 03/06/2021   Foot pain, bilateral 12/26/2020   Diabetic neuropathy ([place]) 01/10/2020   Hav (hallux abducto valgus), unspecified laterality 01/10/2020   Chronic arthropathy 01/10/2020   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 edema associated with type 2 diabetes mellitus ([place]) 12/26/2019   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   AC (acromioclavicular) arthritis 08/02/2019   Unspecified inflammatory spondylopathy, 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[place]) 04/19/2019   Claudication ([place]) 04/19/2019   Morbid obesity ([place]) 04/19/2019   Suspected COVID-19 virus infection 04/13/2019   Upper airway cough syndrome 08/22/2018   Greater trochanteric bursitis of both hips 06/15/2018   Trigger point of shoulder region, left 02/07/2018   Hypertension    Mild pulmonary hypertension (HCC)    NSVT (nonsustained ventricular tachycardia) (HCC)    PVC's (premature ventricular contractions)    URI (upper respiratory infection) 08/09/2016   Neck pain 02/10/2016   Urinary incontinence 02/10/2016   Degenerative arthritis of knee, bilateral 07/17/2015   Knee MCL sprain 06/07/2015   Discomfort in chest 02/15/2015   Chest pain 02/15/2015   DM type 2, controlled, with complication ([place])    Wellness examination 08/06/2014   Acute meniscal tear of knee 02/13/2014   Primary localized osteoarthrosis,  lower leg 02/13/2014   Gastrocnemius tear 12/25/2013   UTI (urinary tract infection) 12/20/2013   Right knee pain 12/20/2013   Pulmonary hypertension ([place]) 10/06/2013   DOE (dyspnea on exertion) 08/04/2013   Dysphagia, unspecified(787.20) 08/10/2012   Hip pain, left 02/02/2012   Painful respiration 05/25/2011   Encounter for long-term (current) use of other medications 01/30/2011   PELVIC PAIN, LEFT 07/30/2010   TOBACCO USE, QUIT 07/07/2010   FATIGUE 12/20/2009   Headache 12/20/2009   Low back pain 12/26/2008   Disorder of liver  04/13/2008   FOOT PAIN, BILATERAL 04/13/2008   FIBROIDS, UTERUS 11/24/2007   THYROID NODULE, LEFT 11/24/2007   HYPERCHOLESTEROLEMIA 11/24/2007   CARPAL TUNNEL SYNDROME, BILATERAL 11/24/2007   ALKALINE PHOSPHATASE, ELEVATED 11/24/2007   Gout 08/10/2007   ANXIETY 08/10/2007   Essential hypertension 08/10/2007   Cough 08/01/2007   Perennial and seasonal allergic rhinitis 01/14/2007   GERD 01/14/2007   Osteoporosis 01/14/2007    REFERRING DIAG:  M25.562,G89.29 (ICD-10-CM) - Chronic pain of left knee  M25.561,G89.29 (ICD-10-CM) - Chronic pain of right knee  S83.232D (ICD-10-CM) - Complex tear of medial meniscus of left knee as current injury, subsequent encounter    THERAPY DIAG:  Difficulty in walking, not elsewhere classified  Localized edema  Stiffness of left knee, not elsewhere classified  Stiffness of right knee, not elsewhere classified  Pain in both knees, unspecified chronicity  Rationale for Evaluation and Treatment Rehabilitation  PERTINENT HISTORY: Type 2 DM, HLD, HTN, osteoporosis   PRECAUTIONS: none  SUBJECTIVE:                                                                                                                                                                                      SUBJECTIVE STATEMENT:   Pt states that she got cortisone shots in her bilat knees on Wednesday's. She reports improved pain with more pain in her R today than her L.    PAIN:  Are you having pain? Yes, left knee 4/10,bil knees Description: constant pain Location: B knees Aggravating factors: exercising Relieving factors: ice    OBJECTIVE: (objective measures completed at initial evaluation unless otherwise dated) DIAGNOSTIC FINDINGS:    Left knee IMPRESSION: 1. Root tear of the posterior horn of the medial meniscus with resulting mild peripheral extrusion of the meniscus from the joint space. 2. Mild medial and patellofemoral compartment  degenerative chondrosis. No acute osseous findings. 3. Intact lateral meniscus, cruciate and collateral ligaments. 4. Heterogeneous, serpiginous T2 hyperintensity centrally and laterally in Hoffa's fat may reflect a venous malformation or atypical ganglion.   Right knee IMPRESSION: 1. Chronic root tear of the posterior horn of the medial meniscus  with progressive peripheral extrusion of the meniscus from the joint space. 2. Progressive medial compartment osteoarthritis. No acute osseous findings. 3. The lateral meniscus, cruciate and collateral ligaments are intact. 4. Small joint effusion and small Baker's cyst.   PATIENT SURVEYS:  FOTO 59 (Goal 63 in 12 visits)   COGNITION: Overall cognitive status: Within functional limits for tasks assessed                         SENSATION: No complaints of peripheral paresthesias, new tingling or numbness   EDEMA:  Noted and not objectively assessed for bilateral knees   LOWER EXTREMITY ROM:   Active ROM Right eval Left eval Rt/Left 05/26/22 Rt/left 06/03/22  Hip flexion        Hip extension        Hip abduction        Hip adduction        Hip internal rotation        Hip external rotation        Knee flexion 124 90 122/110 122/114  Knee extension -2 0 0/0 0/0  Ankle dorsiflexion        Ankle plantarflexion        Ankle inversion        Ankle eversion         (Blank rows = not tested)   LOWER EXTREMITY STRENGTH:   Strength in pounds assessed by hand-held dynamometer Right eval Left eval  Hip flexion      Hip extension      Hip abduction      Hip adduction      Hip internal rotation      Hip external rotation      Knee flexion      Knee extension 19.2 17.3  Ankle dorsiflexion      Ankle plantarflexion      Ankle inversion      Ankle eversion       (Blank rows = not tested)   GAIT: Distance walked: In the clinic Assistive device utilized: Quad cane small base Level of assistance: Complete  Independence Comments: Doesn't use the cane at times but doesn't trust her leg strength     TODAY'S TREATMENT:  06/05/22: TherEx Nustep: level 5 x 5 minutes Seated Hamstring stretch: 2x holding 30 seconds  Piriformis stretch supine x 2 each LE holding 30 sec Bridges:  2 x 10 holding 5 sec Sidelying hip ABD x10  Leg Press Bil 50# 2 x 10 SL 25# x10 each Side stepping: 20 feet x 2 each direction    06/05/22: TherEx Nustep: level 5 x 6 minutes Calf stretch on slant board: x 3 holding 20 sec HS stretch from chair 2x30 seconds  Piriformis stretch from chair 2x30 seconds Bridges:  2 x 10 holding 5 sec Sidelying hip ABD x10  Leg Press Bil 32# x15, SL25# x10 each NMR Standing on Airex: x 30 sec with intermittent UE support Tapping on 4 inch step x 20  Lateral tapping on 4 inch step. Pt only able to tolerate 4x each side prior to pain.   06/03/22: TherEx Nustep: level 5 x 6 minutes Calf stretch on slant board: x 3 holding 20 sec Heel raises: x 15 UE support HS stretch from chair 2x30 seconds  Piriformis stretch from chair 2x30 seconds Bridges:  2 x 10 holding 5 sec Sidelying hip ABD x10  NMR Standing on Airex: x 30 sec with intermittent UE support  Tapping on 4 inch step x 20  Side steppping with UE support 6 feet x 4   05/29/22 TherEx  SciFit bike L5 x6 minutes seat 9 Forward and lateral step ups x10 B 6 inch step light BUE support  HS stretch from chair 2x30 seconds B  Piriformis stretch from chair 2x30 seconds B Staggered bridges x10 B  Sidelying hip ABD x10 B  NMR Tandem stance 3x30 seconds B foam pad min guard                                                                                                     PATIENT EDUCATION:  Education details: exercise form and purpose, walking program or even consider walking in the pool to take stress off of knees   Person educated: Patient Education method: Explanation : verbalized understanding, returned demonstration,  verbal cues required, tactile cues required, and needs further education   HOME EXERCISE PROGRAM: Access Code: [email or web address] URL: [email or web address] Date: 05/18/2022 Prepared by: Vista Mink   Exercises - Supine Quadricep Sets  - 3-5 x daily - 7 x weekly - 2-3 sets - 10 reps - 5 second hold - Seated Knee Flexion AAROM  - 3 x daily - 7 x weekly - 1 sets - 1 reps - 3 minutes hold - Small Range Straight Leg Raise  - 3-5 x daily - 7 x weekly - 2 sets - 5 reps - 3 seconds hold   ASSESSMENT:   CLINICAL IMPRESSION: Pt arriving 10 minutes late today. Pt tolerating exercises however reporting muscle fatigue and required rest breaks throughout. Treatment focusing on LE strengthening. Continue skilled PT to build strength,  endurance, and functional mobility.     OBJECTIVE IMPAIRMENTS: Abnormal gait, decreased activity tolerance, decreased endurance, decreased knowledge of condition, difficulty walking, decreased ROM, decreased strength, increased edema, and pain.    ACTIVITY LIMITATIONS: carrying, lifting, bending, sitting, standing, squatting, stairs, bed mobility, and locomotion level   PARTICIPATION LIMITATIONS: driving, shopping, and community activity   PERSONAL FACTORS: Type 2 DM, HLD, HTN, osteoporosis are also affecting patient's functional outcome.    REHAB POTENTIAL: Good   CLINICAL DECISION MAKING: Stable/uncomplicated   EVALUATION COMPLEXITY: Low     GOALS: Goals reviewed with patient? Yes   SHORT TERM GOALS: Target date: 07/13/2022 Laya will improve her bilateral quadriceps strength to at least 30 pounds Baseline: Less than 20 pounds Goal status: On-going: 05/26/22   2.  Latrell will improve her left knee flexion active range of motion to at least 110 degrees Baseline: 90 degrees Goal status: MET 05/26/22   3.  [place]aver will demonstrate independence with her day 1 home exercise program Baseline: Started 1120/2023 Goal status: MET 05/26/22       LONG TERM  GOALS: Target date: 07/13/2021   Improve Foto to 63 Baseline: 59 Goal status: INITIAL   2.  Verlean will report bilateral knee pain consistently 0-4 out of 10 on the visual analog scale (VAS) Baseline: 3-7 out of 10 Goal status: INITIAL   3.  Improve bilateral quadricep strength to at least 50 pounds bilaterally  Baseline: Less than 20 pounds Goal status: INITIAL   4.  Improve bilateral knee flexion active range of motion to 125 degrees and extension to 0 degrees Baseline: 90/124 and 0/-2 Goal status: INITIAL   5.  Tammera will be independent with her long-term home exercise program at discharge Baseline: Started 05/18/2022 Goal status: INITIAL   PLAN:   PT FREQUENCY: 1-2x/week   PT DURATION: 8 weeks   PLANNED INTERVENTIONS: Therapeutic exercises, Therapeutic activity, Neuromuscular re-education, Balance training, Gait training, Patient/Family education, Self Care, Stair training, Cryotherapy, and Vasopneumatic device   PLAN FOR NEXT SESSION:  progress quadriceps strength, implement balance and functional activities as appropriate.   Kearney Hard, PT, MPT 06/15/22 12:25 PM   06/15/22  12:25 PM

## 2022-06-19 ENCOUNTER — Ambulatory Visit (INDEPENDENT_AMBULATORY_CARE_PROVIDER_SITE_OTHER): Payer: Medicare Other | Admitting: Rehabilitative and Restorative Service Providers"

## 2022-06-19 ENCOUNTER — Encounter: Payer: Self-pay | Admitting: Rehabilitative and Restorative Service Providers"

## 2022-06-19 ENCOUNTER — Other Ambulatory Visit: Payer: Self-pay

## 2022-06-19 DIAGNOSIS — R600 Localized edema: Secondary | ICD-10-CM

## 2022-06-19 DIAGNOSIS — E1142 Type 2 diabetes mellitus with diabetic polyneuropathy: Secondary | ICD-10-CM

## 2022-06-19 DIAGNOSIS — R262 Difficulty in walking, not elsewhere classified: Secondary | ICD-10-CM | POA: Diagnosis not present

## 2022-06-19 DIAGNOSIS — M25562 Pain in left knee: Secondary | ICD-10-CM

## 2022-06-19 DIAGNOSIS — M25662 Stiffness of left knee, not elsewhere classified: Secondary | ICD-10-CM | POA: Diagnosis not present

## 2022-06-19 DIAGNOSIS — M25661 Stiffness of right knee, not elsewhere classified: Secondary | ICD-10-CM

## 2022-06-19 DIAGNOSIS — M25561 Pain in right knee: Secondary | ICD-10-CM | POA: Diagnosis not present

## 2022-06-19 MED ORDER — ONETOUCH VERIO FLEX SYSTEM W/DEVICE KIT: PACK | 0 refills | Status: AC

## 2022-06-19 NOTE — Therapy (Signed)
OUTPATIENT PHYSICAL THERAPY TREATMENT/PROGRESS NOTE   Patient Name: Amy Escobar MRN: [id number] DOB:Apr 27, 1946, 76 y.o., female Today's Date: 06/19/2022  PCP: [name], FNP  REFERRING PROVIDER: Mcarthur Rossetti, MD   END OF SESSION:   PT End of Session - 06/19/22 0911     Visit Number 7    Number of Visits 16    Progress Note Due on Visit 10    PT Start Time 0804    PT Stop Time 0017    PT Time Calculation (min) 39 min    Activity Tolerance Patient tolerated treatment well;No increased pain    Behavior During Therapy [facility] for tasks assessed/performed            Progress Note Reporting Period 05/18/2022 to 06/19/2022  See note below for Objective Data and Assessment of Progress/Goals.      Past Medical History:  Diagnosis Date   Alkaline phosphatase deficiency    w/u Ne   Allergic rhinitis    Allergy    Anxiety    Arthritis    Cataract    BILATERAL-REMOVED   DM2 (diabetes mellitus, type 2) (HCC)    GERD (gastroesophageal reflux disease)    Gout    Hemorrhoids    Hyperlipidemia    Hypertension    Mild pulmonary hypertension (HCC)    NSVT (nonsustained ventricular tachycardia) (HCC)    Osteoporosis    PVC's (premature ventricular contractions)    Thyroid nodule    small   Tubular adenoma of colon 2017   Urticaria    Uterine fibroid    Past Surgical History:  Procedure Laterality Date   CATARACT EXTRACTION Bilateral 2016   COLONOSCOPY  2007   echocardiogram (other)  01/16/2002   POLYPECTOMY     removed tumors from foot nerves  04/1999   stress cardiolite  02/12/2006   TOENAIL EXCISION     TUBAL LIGATION     TYMPANOSTOMY TUBE PLACEMENT     Patient Active Problem List   Diagnosis Date Noted   Type 2 diabetes mellitus with diabetic polyneuropathy, without long-term current use of insulin ([place]) 06/18/2021   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 without macular edema, without  long-term current use of insulin ([place]) 06/18/2021   Diabetes mellitus ([place]) 06/18/2021   Osteoarthritis of right AC (acromioclavicular) joint 03/06/2021   Osteoarthritis of right glenohumeral joint 03/06/2021   Foot pain, bilateral 12/26/2020   Diabetic neuropathy ([place]) 01/10/2020   Hav (hallux abducto valgus), unspecified laterality 01/10/2020   Chronic arthropathy 01/10/2020   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 edema associated with type 2 diabetes mellitus ([place]) 12/26/2019   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   AC (acromioclavicular) arthritis 08/02/2019   Unspecified inflammatory spondylopathy, 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[place]) 04/19/2019   Claudication ([place]) 04/19/2019   Morbid obesity ([place]) 04/19/2019   Suspected COVID-19 virus infection 04/13/2019   Upper airway cough syndrome 08/22/2018   Greater trochanteric bursitis of both hips 06/15/2018   Trigger point of shoulder region, left 02/07/2018   Hypertension    Mild pulmonary hypertension (HCC)    NSVT (nonsustained ventricular tachycardia) (HCC)    PVC's (premature ventricular contractions)    URI (upper respiratory infection) 08/09/2016   Neck pain 02/10/2016   Urinary incontinence 02/10/2016   Degenerative arthritis of knee, bilateral 07/17/2015   Knee MCL sprain 06/07/2015   Discomfort in chest 02/15/2015   Chest pain 02/15/2015   DM type 2, controlled, with  complication ([place])    Wellness examination 08/06/2014   Acute meniscal tear of knee 02/13/2014   Primary localized osteoarthrosis, lower leg 02/13/2014   Gastrocnemius tear 12/25/2013   UTI (urinary tract infection) 12/20/2013   Right knee pain 12/20/2013   Pulmonary hypertension ([place]) 10/06/2013   DOE (dyspnea on exertion) 08/04/2013   Dysphagia, unspecified(787.20) 08/10/2012   Hip pain, left 02/02/2012   Painful respiration 05/25/2011   Encounter for long-term (current) use of other medications 01/30/2011   PELVIC  PAIN, LEFT 07/30/2010   TOBACCO USE, QUIT 07/07/2010   FATIGUE 12/20/2009   Headache 12/20/2009   Low back pain 12/26/2008   Disorder of liver 04/13/2008   FOOT PAIN, BILATERAL 04/13/2008   FIBROIDS, UTERUS 11/24/2007   THYROID NODULE, LEFT 11/24/2007   HYPERCHOLESTEROLEMIA 11/24/2007   CARPAL TUNNEL SYNDROME, BILATERAL 11/24/2007   ALKALINE PHOSPHATASE, ELEVATED 11/24/2007   Gout 08/10/2007   ANXIETY 08/10/2007   Essential hypertension 08/10/2007   Cough 08/01/2007   Perennial and seasonal allergic rhinitis 01/14/2007   GERD 01/14/2007   Osteoporosis 01/14/2007    REFERRING DIAG:  M25.562,G89.29 (ICD-10-CM) - Chronic pain of left knee  M25.561,G89.29 (ICD-10-CM) - Chronic pain of right knee  S83.232D (ICD-10-CM) - Complex tear of medial meniscus of left knee as current injury, subsequent encounter    THERAPY DIAG:  Difficulty in walking, not elsewhere classified  Localized edema  Stiffness of left knee, not elsewhere classified  Stiffness of right knee, not elsewhere classified  Pain in both knees, unspecified chronicity  Rationale for Evaluation and Treatment Rehabilitation  PERTINENT HISTORY: Type 2 DM, HLD, HTN, osteoporosis   PRECAUTIONS: none  SUBJECTIVE:                                                                                                                                                                                      SUBJECTIVE STATEMENT:   [name] reports that there are long periods of time where she has little to no bilateral knee pain.  She still has days/times where she can get 6/10 pain.  Weather and prolonged activities are most irritating.   PAIN:  Are you having pain? Yes, left knee 0-6/10, right knee 0-6/10 this week Description: Ache Location: B knees Aggravating factors: Prolonged postures, rain, cold weather Relieving factors: Ice, exercise, movement    OBJECTIVE: (objective measures completed at initial evaluation unless  otherwise dated) DIAGNOSTIC FINDINGS:    Left knee IMPRESSION: 1. Root tear of the posterior horn of the medial meniscus with resulting mild peripheral extrusion of the meniscus from the joint space. 2. Mild medial and patellofemoral compartment degenerative chondrosis. No acute osseous findings. 3. Intact lateral meniscus, cruciate  and collateral ligaments. 4. Heterogeneous, serpiginous T2 hyperintensity centrally and laterally in Hoffa's fat may reflect a venous malformation or atypical ganglion.   Right knee IMPRESSION: 1. Chronic root tear of the posterior horn of the medial meniscus with progressive peripheral extrusion of the meniscus from the joint space. 2. Progressive medial compartment osteoarthritis. No acute osseous findings. 3. The lateral meniscus, cruciate and collateral ligaments are intact. 4. Small joint effusion and small Baker's cyst.   PATIENT SURVEYS:  06/19/2022 FOTO 78 (Goal met)  05/18/2022 FOTO 59 (Goal 63 in 12 visits)   COGNITION: Overall cognitive status: Within functional limits for tasks assessed                         SENSATION: No complaints of peripheral paresthesias, new tingling or numbness   EDEMA:  Noted and not objectively assessed for bilateral knees   LOWER EXTREMITY ROM:   Active ROM Right eval Left eval Rt/Left 05/26/22 Rt/left 06/03/22  Hip flexion        Hip extension        Hip abduction        Hip adduction        Hip internal rotation        Hip external rotation        Knee flexion 124 90 122/110 122/114  Knee extension -2 0 0/0 0/0  Ankle dorsiflexion        Ankle plantarflexion        Ankle inversion        Ankle eversion         (Blank rows = not tested)   LOWER EXTREMITY STRENGTH:   Strength in pounds assessed by hand-held dynamometer Right 05/18/22 Left 05/18/22 Left/Right in pounds 06/19/22  Hip flexion       Hip extension       Hip abduction       Hip adduction       Hip internal rotation        Hip external rotation       Knee flexion       Knee extension 19.2 17.3 32.7/43.2  Ankle dorsiflexion       Ankle plantarflexion       Ankle inversion       Ankle eversion        (Blank rows = not tested)   GAIT: Distance walked: In the clinic Assistive device utilized: None (was Quad cane small base) Level of assistance: Complete Independence Comments: Rarely uses the cane (previously used the cane almost of the time)     TODAY'S TREATMENT:  06/19/22: Bridges 10X 5 seconds Quadriceps sets 10X 5 seconds Seated straight leg raises 3 sets of 5 for 3 seconds  Functional Activities: Reassessment, review imaging, gym and HEP   06/05/22: TherEx Nustep: level 5 x 5 minutes Seated Hamstring stretch: 2x holding 30 seconds  Piriformis stretch supine x 2 each LE holding 30 sec Bridges:  2 x 10 holding 5 sec Sidelying hip ABD x10  Leg Press Bil 50# 2 x 10 SL 25# x10 each Side stepping: 20 feet x 2 each direction   06/03/22: TherEx Nustep: level 5 x 6 minutes Calf stretch on slant board: x 3 holding 20 sec HS stretch from chair 2x30 seconds  Piriformis stretch from chair 2x30 seconds Bridges:  2 x 10 holding 5 sec Sidelying hip ABD x10  Leg Press Bil 32# x15, SL25# x10 each NMR Standing on Airex:  x 30 sec with intermittent UE support Tapping on 4 inch step x 20  Lateral tapping on 4 inch step. Pt only able to tolerate 4x each side prior to pain.     PATIENT EDUCATION:  Education details: exercise form and purpose, walking program or even consider walking in the pool to take stress off of knees   Person educated: Patient Education method: Explanation : verbalized understanding, returned demonstration, verbal cues required, tactile cues required, and needs further education   HOME EXERCISE PROGRAM: Access Code: [email or web address] URL: [email or web address] Date: 06/19/2022 Prepared by: Vista Mink  Exercises - Supine Quadricep Sets  - 3-5 x daily - 7 x weekly -  2-3 sets - 10 reps - 5 second hold - Seated Knee Flexion AAROM  - 3 x daily - 3 x weekly - 1 sets - 1 reps - 3 minutes hold - Small Range Straight Leg Raise  - 3-5 x daily - 3 x weekly - 2 sets - 5 reps - 3 seconds hold - Yoga Bridge  - 1 x daily - 7 x weekly - 2-3 sets - 10 reps - 5 seconds hold    ASSESSMENT:   CLINICAL IMPRESSION: Velda is making excellent progress with her physical therapy.  Other than weather fronts moving through, cold weather and overuse, she is doing much better.  Quadriceps strength has significantly improved and remains lower than normal.  I recommend Telena continue her current program for a few more visits until she is comfortable and independent with her HEP.     OBJECTIVE IMPAIRMENTS: Abnormal gait, decreased activity tolerance, decreased endurance, decreased knowledge of condition, difficulty walking, decreased ROM, decreased strength, increased edema, and pain.    ACTIVITY LIMITATIONS: carrying, lifting, bending, sitting, standing, squatting, stairs, bed mobility, and locomotion level   PARTICIPATION LIMITATIONS: driving, shopping, and community activity   PERSONAL FACTORS: Type 2 DM, HLD, HTN, osteoporosis are also affecting patient's functional outcome.    REHAB POTENTIAL: Good   CLINICAL DECISION MAKING: Stable/uncomplicated   EVALUATION COMPLEXITY: Low     GOALS: Goals reviewed with patient? Yes   SHORT TERM GOALS: Target date: 07/13/2022 Yelitza will improve her bilateral quadriceps strength to at least 30 pounds Baseline: Less than 20 pounds Goal status: MET 06/19/22   2.  Zhania will improve her left knee flexion active range of motion to at least 110 degrees Baseline: 90 degrees Goal status: MET 05/26/22   3.  Deyonna will demonstrate independence with her day 1 home exercise program Baseline: Started 1120/2023 Goal status: MET 05/26/22       LONG TERM GOALS: Target date: 07/13/2021   Improve Foto to 63 Baseline: 59 Goal status: MET  06/19/2022   2.  Estera will report bilateral knee pain consistently 0-4 out of 10 on the visual analog scale (VAS) Baseline: 3-7 out of 10 Goal status: On Going 06/19/2022   3.  Improve bilateral quadriceps strength to at least 50 pounds bilaterally Baseline: Less than 20 pounds Goal status: On Going 06/19/2022   4.  Improve bilateral knee flexion active range of motion to 125 degrees and extension to 0 degrees Baseline: 90/124 and 0/-2 Goal status: On Going 06/19/2022   5.  Yolette will be independent with her long-term home exercise program at discharge Baseline: Started 05/18/2022 Goal status: On Going 06/19/2022   PLAN:   PT FREQUENCY: 1-2x/week   PT DURATION: 8 weeks   PLANNED INTERVENTIONS: Therapeutic exercises, Therapeutic activity, Neuromuscular re-education, Balance training, Gait training, Patient/Family education, Self  Care, Stair training, Cryotherapy, and Vasopneumatic device   PLAN FOR NEXT SESSION: [place] quadriceps strength, implement balance and functional activities as appropriate.   Farley Ly PT, MPT 06/19/22 9:12 AM   06/19/22  9:12 AM

## 2022-06-24 DIAGNOSIS — L603 Nail dystrophy: Secondary | ICD-10-CM | POA: Diagnosis not present

## 2022-06-24 DIAGNOSIS — I739 Peripheral vascular disease, unspecified: Secondary | ICD-10-CM | POA: Diagnosis not present

## 2022-06-24 DIAGNOSIS — E1142 Type 2 diabetes mellitus with diabetic polyneuropathy: Secondary | ICD-10-CM | POA: Diagnosis not present

## 2022-06-24 DIAGNOSIS — E1151 Type 2 diabetes mellitus with diabetic peripheral angiopathy without gangrene: Secondary | ICD-10-CM | POA: Diagnosis not present

## 2022-06-25 ENCOUNTER — Ambulatory Visit (INDEPENDENT_AMBULATORY_CARE_PROVIDER_SITE_OTHER): Payer: Medicare Other | Admitting: Rehabilitative and Restorative Service Providers"

## 2022-06-25 ENCOUNTER — Encounter: Payer: Self-pay | Admitting: Rehabilitative and Restorative Service Providers"

## 2022-06-25 DIAGNOSIS — M25661 Stiffness of right knee, not elsewhere classified: Secondary | ICD-10-CM

## 2022-06-25 DIAGNOSIS — M25662 Stiffness of left knee, not elsewhere classified: Secondary | ICD-10-CM | POA: Diagnosis not present

## 2022-06-25 DIAGNOSIS — M25562 Pain in left knee: Secondary | ICD-10-CM

## 2022-06-25 DIAGNOSIS — R262 Difficulty in walking, not elsewhere classified: Secondary | ICD-10-CM | POA: Diagnosis not present

## 2022-06-25 DIAGNOSIS — M25561 Pain in right knee: Secondary | ICD-10-CM

## 2022-06-25 DIAGNOSIS — R600 Localized edema: Secondary | ICD-10-CM

## 2022-06-25 NOTE — Therapy (Signed)
OUTPATIENT PHYSICAL THERAPY TREATMENT NOTE   Patient Name: Amy Escobar MRN: [id number] DOB:[birth year], 76 y.o., female Today's Date: 06/25/2022  PCP: [name], FNP  REFERRING PROVIDER: Mcarthur Rossetti, MD   END OF SESSION:   PT End of Session - 06/25/22 0904     Visit Number 8    Number of Visits 16    Progress Note Due on Visit 17    PT Start Time 2876    PT Stop Time 8115    PT Time Calculation (min) 29 min    Activity Tolerance Patient tolerated treatment well;No increased pain    Behavior During Therapy WFL for tasks assessed/performed             Past Medical History:  Diagnosis Date   Alkaline phosphatase deficiency    w/u Ne   Allergic rhinitis    Allergy    Anxiety    Arthritis    Cataract    BILATERAL-REMOVED   DM2 (diabetes mellitus, type 2) (HCC)    GERD (gastroesophageal reflux disease)    Gout    Hemorrhoids    Hyperlipidemia    Hypertension    Mild pulmonary hypertension (HCC)    NSVT (nonsustained ventricular tachycardia) (HCC)    Osteoporosis    PVC's (premature ventricular contractions)    Thyroid nodule    small   Tubular adenoma of colon 2017   Urticaria    Uterine fibroid    Past Surgical History:  Procedure Laterality Date   CATARACT EXTRACTION Bilateral 2016   COLONOSCOPY  2007   echocardiogram (other)  01/16/2002   POLYPECTOMY     removed tumors from foot nerves  04/1999   stress cardiolite  02/12/2006   TOENAIL EXCISION     TUBAL LIGATION     TYMPANOSTOMY TUBE PLACEMENT     Patient Active Problem List   Diagnosis Date Noted   Type 2 diabetes mellitus with diabetic polyneuropathy, without long-term current use of insulin ([place]) 06/18/2021   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 without macular edema, without long-term current use of insulin ([place]) 06/18/2021   Diabetes mellitus ([place]) 06/18/2021   Osteoarthritis of right AC (acromioclavicular) joint  03/06/2021   Osteoarthritis of right glenohumeral joint 03/06/2021   Foot pain, bilateral 12/26/2020   Diabetic neuropathy ([place]) 01/10/2020   Hav (hallux abducto valgus), unspecified laterality 01/10/2020   Chronic arthropathy 01/10/2020   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 edema associated with type 2 diabetes mellitus ([place]) 12/26/2019   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   AC (acromioclavicular) arthritis 08/02/2019   Unspecified inflammatory spondylopathy, 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[place]) 04/19/2019   Claudication ([place]) 04/19/2019   Morbid obesity ([place]) 04/19/2019   Suspected COVID-19 virus infection 04/13/2019   Upper airway cough syndrome 08/22/2018   Greater trochanteric bursitis of both hips 06/15/2018   Trigger point of shoulder region, left 02/07/2018   Hypertension    Mild pulmonary hypertension (HCC)    NSVT (nonsustained ventricular tachycardia) (HCC)    PVC's (premature ventricular contractions)    URI (upper respiratory infection) 08/09/2016   Neck pain 02/10/2016   Urinary incontinence 02/10/2016   Degenerative arthritis of knee, bilateral 07/17/2015   Knee MCL sprain 06/07/2015   Discomfort in chest 02/15/2015   Chest pain 02/15/2015   DM type 2, controlled, with complication ([place])    Wellness examination 08/06/2014   Acute meniscal tear of knee 02/13/2014   Primary localized osteoarthrosis, lower  leg 02/13/2014   Gastrocnemius tear 12/25/2013   UTI (urinary tract infection) 12/20/2013   Right knee pain 12/20/2013   Pulmonary hypertension ([place]) 10/06/2013   DOE (dyspnea on exertion) 08/04/2013   Dysphagia, unspecified(787.20) 08/10/2012   Hip pain, left 02/02/2012   Painful respiration 05/25/2011   Encounter for long-term (current) use of other medications 01/30/2011   PELVIC PAIN, LEFT 07/30/2010   TOBACCO USE, QUIT 07/07/2010   FATIGUE 12/20/2009   Headache 12/20/2009   Low back pain 12/26/2008   Disorder of  liver 04/13/2008   FOOT PAIN, BILATERAL 04/13/2008   FIBROIDS, UTERUS 11/24/2007   THYROID NODULE, LEFT 11/24/2007   HYPERCHOLESTEROLEMIA 11/24/2007   CARPAL TUNNEL SYNDROME, BILATERAL 11/24/2007   ALKALINE PHOSPHATASE, ELEVATED 11/24/2007   Gout 08/10/2007   ANXIETY 08/10/2007   Essential hypertension 08/10/2007   Cough 08/01/2007   Perennial and seasonal allergic rhinitis 01/14/2007   GERD 01/14/2007   Osteoporosis 01/14/2007    REFERRING DIAG:  M25.562,G89.29 (ICD-10-CM) - Chronic pain of left knee  M25.561,G89.29 (ICD-10-CM) - Chronic pain of right knee  S83.232D (ICD-10-CM) - Complex tear of medial meniscus of left knee as current injury, subsequent encounter    THERAPY DIAG:  Difficulty in walking, not elsewhere classified  Localized edema  Stiffness of left knee, not elsewhere classified  Stiffness of right knee, not elsewhere classified  Pain in both knees, unspecified chronicity  Rationale for Evaluation and Treatment Rehabilitation  PERTINENT HISTORY: Type 2 DM, HLD, HTN, osteoporosis   PRECAUTIONS: none  SUBJECTIVE:                                                                                                                                                                                      SUBJECTIVE STATEMENT:   Amy Escobar had a rough past 3 days with all the rain.  Her knees hurt constantly but are doing better today.  She reports that there typically are long periods of time where she has little to no bilateral knee pain.  She still has days/times where she can get 6/10 pain, like this past weekend.  Weather and prolonged activities are most irritating.   PAIN:  Are you having pain? Yes, left knee 0-6/10, right knee 0-6/10 this weekend Description: Ache Location: B knees Aggravating factors: Prolonged postures, rain, cold weather Relieving factors: Ice, exercise, movement    OBJECTIVE: (objective measures completed at initial evaluation unless  otherwise dated) DIAGNOSTIC FINDINGS:    Left knee IMPRESSION: 1. Root tear of the posterior horn of the medial meniscus with resulting mild peripheral extrusion of the meniscus from the joint space. 2. Mild medial and patellofemoral compartment degenerative chondrosis. No acute osseous findings.  3. Intact lateral meniscus, cruciate and collateral ligaments. 4. Heterogeneous, serpiginous T2 hyperintensity centrally and laterally in Hoffa's fat may reflect a venous malformation or atypical ganglion.   Right knee IMPRESSION: 1. Chronic root tear of the posterior horn of the medial meniscus with progressive peripheral extrusion of the meniscus from the joint space. 2. Progressive medial compartment osteoarthritis. No acute osseous findings. 3. The lateral meniscus, cruciate and collateral ligaments are intact. 4. Small joint effusion and small Baker's cyst.   PATIENT SURVEYS:  06/19/2022 FOTO 78 (Goal met)  05/18/2022 FOTO 59 (Goal 63 in 12 visits)   COGNITION: Overall cognitive status: Within functional limits for tasks assessed                         SENSATION: No complaints of peripheral paresthesias, new tingling or numbness   EDEMA:  Noted and not objectively assessed for bilateral knees   LOWER EXTREMITY ROM:   Active ROM Right eval Left eval Rt/Left 05/26/22 Rt/left 06/03/22  Hip flexion        Hip extension        Hip abduction        Hip adduction        Hip internal rotation        Hip external rotation        Knee flexion 124 90 122/110 122/114  Knee extension -2 0 0/0 0/0  Ankle dorsiflexion        Ankle plantarflexion        Ankle inversion        Ankle eversion         (Blank rows = not tested)   LOWER EXTREMITY STRENGTH:   Strength in pounds assessed by hand-held dynamometer Right 05/18/22 Left 05/18/22 Left/Right in pounds 06/19/22  Hip flexion       Hip extension       Hip abduction       Hip adduction       Hip internal rotation        Hip external rotation       Knee flexion       Knee extension 19.2 17.3 32.7/43.2  Ankle dorsiflexion       Ankle plantarflexion       Ankle inversion       Ankle eversion        (Blank rows = not tested)   GAIT: Distance walked: In the clinic Assistive device utilized: None (was Quad cane small base) Level of assistance: Complete Independence Comments: Rarely uses the cane (previously used the cane almost of the time)     TODAY'S TREATMENT:  06/25/2022 Bridges 10X 5 seconds Quadriceps sets 10X 5 seconds Seated straight leg raises 3 sets of 5 for 3 seconds  Functional Activities: Double Leg Press 50# 15X slow eccentrics Single Leg Press 25# Bil 10X slow eccentrics   06/19/22: Bridges 10X 5 seconds Quadriceps sets 10X 5 seconds Seated straight leg raises 3 sets of 5 for 3 seconds  Functional Activities: Reassessment, review imaging, gym and HEP   06/05/22: TherEx Nustep: level 5 x 5 minutes Seated Hamstring stretch: 2x holding 30 seconds  Piriformis stretch supine x 2 each LE holding 30 sec Bridges:  2 x 10 holding 5 sec Sidelying hip ABD x10  Leg Press Bil 50# 2 x 10 SL 25# x10 each Side stepping: 20 feet x 2 each direction     PATIENT EDUCATION:  Education details: exercise form and  purpose, walking program or even consider walking in the pool to take stress off of knees   Person educated: Patient Education method: Explanation : verbalized understanding, returned demonstration, verbal cues required, tactile cues required, and needs further education   HOME EXERCISE PROGRAM: Access Code: [email or web address] URL: [email or web address] Date: 06/19/2022 Prepared by: Vista Mink  Exercises - Supine Quadricep Sets  - 3-5 x daily - 7 x weekly - 2-3 sets - 10 reps - 5 second hold - Seated Knee Flexion AAROM  - 3 x daily - 3 x weekly - 1 sets - 1 reps - 3 minutes hold - Small Range Straight Leg Raise  - 3-5 x daily - 3 x weekly - 2 sets - 5 reps - 3  seconds hold - Yoga Bridge  - 1 x daily - 7 x weekly - 2-3 sets - 10 reps - 5 seconds hold    ASSESSMENT:   CLINICAL IMPRESSION: Lota reports continued good compliance with her HEP.  Other than weather fronts moving through, cold weather, rain and overuse, she is doing much better.  Quadriceps strength work remains the focus of her home and clinic program and R[place]a will be transitioned into independent rehabilitation over the next 1-3 weeks as strength continues to improve.    OBJECTIVE IMPAIRMENTS: Abnormal gait, decreased activity tolerance, decreased endurance, decreased knowledge of condition, difficulty walking, decreased ROM, decreased strength, increased edema, and pain.    ACTIVITY LIMITATIONS: carrying, lifting, bending, sitting, standing, squatting, stairs, bed mobility, and locomotion level   PARTICIPATION LIMITATIONS: driving, shopping, and community activity   PERSONAL FACTORS: Type 2 DM, HLD, HTN, osteoporosis are also affecting patient's functional outcome.    REHAB POTENTIAL: Good   CLINICAL DECISION MAKING: Stable/uncomplicated   EVALUATION COMPLEXITY: Low     GOALS: Goals reviewed with patient? Yes   SHORT TERM GOALS: Target date: 07/13/2022 Say[place] will improve her bilateral quadriceps strength to at least 30 pounds Baseline: Less than 20 pounds Goal status: MET 06/19/22   2.  Nigel will improve her left knee flexion active range of motion to at least 110 degrees Baseline: 90 degrees Goal status: MET 05/26/22   3.  Oktober will demonstrate independence with her day 1 home exercise program Baseline: Started 1120/2023 Goal status: MET 05/26/22       LONG TERM GOALS: Target date: 07/13/2021   Improve Foto to 63 Baseline: 59 Goal status: MET 06/19/2022   2.  Reyna will report bilateral knee pain consistently 0-4 out of 10 on the visual analog scale (VAS) Baseline: 3-7 out of 10 Goal status: On Going 06/25/2022   3.  Improve bilateral quadriceps strength to at  least 50 pounds bilaterally Baseline: Less than 20 pounds Goal status: On Going 06/19/2022   4.  Improve bilateral knee flexion active range of motion to 125 degrees and extension to 0 degrees Baseline: 90/124 and 0/-2 Goal status: On Going 06/19/2022   5.  Cherylene will be independent with her long-term home exercise program at discharge Baseline: Started 05/18/2022 Goal status: On Going 06/25/2022   PLAN:   PT FREQUENCY: 1-2x/week   PT DURATION: 8 weeks   PLANNED INTERVENTIONS: Therapeutic exercises, Therapeutic activity, Neuromuscular re-education, Balance training, Gait training, Patient/Family education, Self Care, Stair training, Cryotherapy, and Vasopneumatic device   PLAN FOR NEXT SESSION: Progress quadriceps strength, implement balance and functional activities as appropriate.   Farley Ly PT, MPT 06/25/22 9:28 AM   06/25/22  9:28 AM

## 2022-06-26 ENCOUNTER — Ambulatory Visit (INDEPENDENT_AMBULATORY_CARE_PROVIDER_SITE_OTHER): Payer: Medicare Other | Admitting: Rehabilitative and Restorative Service Providers"

## 2022-06-26 ENCOUNTER — Encounter: Payer: Self-pay | Admitting: Rehabilitative and Restorative Service Providers"

## 2022-06-26 DIAGNOSIS — M25662 Stiffness of left knee, not elsewhere classified: Secondary | ICD-10-CM | POA: Diagnosis not present

## 2022-06-26 DIAGNOSIS — R600 Localized edema: Secondary | ICD-10-CM | POA: Diagnosis not present

## 2022-06-26 DIAGNOSIS — M25561 Pain in right knee: Secondary | ICD-10-CM

## 2022-06-26 DIAGNOSIS — M25562 Pain in left knee: Secondary | ICD-10-CM

## 2022-06-26 DIAGNOSIS — R262 Difficulty in walking, not elsewhere classified: Secondary | ICD-10-CM

## 2022-06-26 DIAGNOSIS — M25661 Stiffness of right knee, not elsewhere classified: Secondary | ICD-10-CM

## 2022-06-26 NOTE — Therapy (Signed)
OUTPATIENT PHYSICAL THERAPY TREATMENT NOTE   Patient Name: Amy Escobar MRN: [id number] DOB:1945/08/18, 76 y.o., female Today's Date: 06/26/2022  PCP: [name], FNP  REFERRING PROVIDER: Mcarthur Rossetti, MD   END OF SESSION:   PT End of Session - 06/26/22 0816     Visit Number 9    Number of Visits 16    Progress Note Due on Visit 17    PT Start Time 4235    PT Stop Time 0853    PT Time Calculation (min) 39 min    Activity Tolerance Patient tolerated treatment well;No increased pain    Behavior During Therapy WFL for tasks assessed/performed              Past Medical History:  Diagnosis Date   Alkaline phosphatase deficiency    w/u Ne   Allergic rhinitis    Allergy    Anxiety    Arthritis    Cataract    BILATERAL-REMOVED   DM2 (diabetes mellitus, type 2) (HCC)    GERD (gastroesophageal reflux disease)    Gout    Hemorrhoids    Hyperlipidemia    Hypertension    Mild pulmonary hypertension (HCC)    NSVT (nonsustained ventricular tachycardia) (HCC)    Osteoporosis    PVC's (premature ventricular contractions)    Thyroid nodule    small   Tubular adenoma of colon 2017   Urticaria    Uterine fibroid    Past Surgical History:  Procedure Laterality Date   CATARACT EXTRACTION Bilateral 2016   COLONOSCOPY  2007   echocardiogram (other)  01/16/2002   POLYPECTOMY     removed tumors from foot nerves  04/1999   stress cardiolite  02/12/2006   TOENAIL EXCISION     TUBAL LIGATION     TYMPANOSTOMY TUBE PLACEMENT     Patient Active Problem List   Diagnosis Date Noted   Type 2 diabetes mellitus with diabetic polyneuropathy, without long-term current use of insulin ([place]) 06/18/2021   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 without macular edema, without long-term current use of insulin ([place]) 06/18/2021   Diabetes mellitus ([place]) 06/18/2021   Osteoarthritis of right AC (acromioclavicular)  joint 03/06/2021   Osteoarthritis of right glenohumeral joint 03/06/2021   Foot pain, bilateral 12/26/2020   Diabetic neuropathy ([place]) 01/10/2020   Hav (hallux abducto valgus), unspecified laterality 01/10/2020   Chronic arthropathy 01/10/2020   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 edema associated with type 2 diabetes mellitus ([place]) 12/26/2019   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   AC (acromioclavicular) arthritis 08/02/2019   Unspecified inflammatory spondylopathy, 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[place]) 04/19/2019   Claudication ([place]) 04/19/2019   Morbid obesity ([place]) 04/19/2019   Suspected COVID-19 virus infection 04/13/2019   Upper airway cough syndrome 08/22/2018   Greater trochanteric bursitis of both hips 06/15/2018   Trigger point of shoulder region, left 02/07/2018   Hypertension    Mild pulmonary hypertension (HCC)    NSVT (nonsustained ventricular tachycardia) (HCC)    PVC's (premature ventricular contractions)    URI (upper respiratory infection) 08/09/2016   Neck pain 02/10/2016   Urinary incontinence 02/10/2016   Degenerative arthritis of knee, bilateral 07/17/2015   Knee MCL sprain 06/07/2015   Discomfort in chest 02/15/2015   Chest pain 02/15/2015   DM type 2, controlled, with complication ([place])    Wellness examination 08/06/2014   Acute meniscal tear of knee 02/13/2014   Primary localized osteoarthrosis,  lower leg 02/13/2014   Gastrocnemius tear 12/25/2013   UTI (urinary tract infection) 12/20/2013   Right knee pain 12/20/2013   Pulmonary hypertension ([place]) 10/06/2013   DOE (dyspnea on exertion) 08/04/2013   Dysphagia, unspecified(787.20) 08/10/2012   Hip pain, left 02/02/2012   Painful respiration 05/25/2011   Encounter for long-term (current) use of other medications 01/30/2011   PELVIC PAIN, LEFT 07/30/2010   TOBACCO USE, QUIT 07/07/2010   FATIGUE 12/20/2009   Headache 12/20/2009   Low back pain 12/26/2008   Disorder  of liver 04/13/2008   FOOT PAIN, BILATERAL 04/13/2008   FIBROIDS, UTERUS 11/24/2007   THYROID NODULE, LEFT 11/24/2007   HYPERCHOLESTEROLEMIA 11/24/2007   CARPAL TUNNEL SYNDROME, BILATERAL 11/24/2007   ALKALINE PHOSPHATASE, ELEVATED 11/24/2007   Gout 08/10/2007   ANXIETY 08/10/2007   Essential hypertension 08/10/2007   Cough 08/01/2007   Perennial and seasonal allergic rhinitis 01/14/2007   GERD 01/14/2007   Osteoporosis 01/14/2007    REFERRING DIAG:  M25.562,G89.29 (ICD-10-CM) - Chronic pain of left knee  M25.561,G89.29 (ICD-10-CM) - Chronic pain of right knee  S83.232D (ICD-10-CM) - Complex tear of medial meniscus of left knee as current injury, subsequent encounter    THERAPY DIAG:  Difficulty in walking, not elsewhere classified  Localized edema  Stiffness of left knee, not elsewhere classified  Stiffness of right knee, not elsewhere classified  Pain in both knees, unspecified chronicity  Rationale for Evaluation and Treatment Rehabilitation  PERTINENT HISTORY: Type 2 DM, HLD, HTN, osteoporosis   PRECAUTIONS: none  SUBJECTIVE:                                                                                                                                                                                      SUBJECTIVE STATEMENT:   [name] is feeling the cold weather this morning.  Weather and prolonged activities are most irritating.   PAIN:  Are you having pain? Yes, left knee 8/10, right knee 8/10 with the cold this morning Description: Ache Location: B knees Aggravating factors: Prolonged postures, rain, cold weather Relieving factors: Ice, exercise, movement    OBJECTIVE: (objective measures completed at initial evaluation unless otherwise dated) DIAGNOSTIC FINDINGS:    Left knee IMPRESSION: 1. Root tear of the posterior horn of the medial meniscus with resulting mild peripheral extrusion of the meniscus from the joint space. 2. Mild medial and  patellofemoral compartment degenerative chondrosis. No acute osseous findings. 3. Intact lateral meniscus, cruciate and collateral ligaments. 4. Heterogeneous, serpiginous T2 hyperintensity centrally and laterally in Hoffa's fat may reflect a venous malformation or atypical ganglion.   Right knee IMPRESSION: 1. Chronic root tear of the posterior horn of the medial meniscus  with progressive peripheral extrusion of the meniscus from the joint space. 2. Progressive medial compartment osteoarthritis. No acute osseous findings. 3. The lateral meniscus, cruciate and collateral ligaments are intact. 4. Small joint effusion and small Baker's cyst.   PATIENT SURVEYS:  06/19/2022 FOTO 78 (Goal met)  05/18/2022 FOTO 59 (Goal 63 in 12 visits)   COGNITION: Overall cognitive status: Within functional limits for tasks assessed                         SENSATION: No complaints of peripheral paresthesias, new tingling or numbness   EDEMA:  Noted and not objectively assessed for bilateral knees   LOWER EXTREMITY ROM:   Active ROM Right eval Left eval Rt/Left 05/26/22 Rt/left 06/03/22  Hip flexion        Hip extension        Hip abduction        Hip adduction        Hip internal rotation        Hip external rotation        Knee flexion 124 90 122/110 122/114  Knee extension -2 0 0/0 0/0  Ankle dorsiflexion        Ankle plantarflexion        Ankle inversion        Ankle eversion         (Blank rows = not tested)   LOWER EXTREMITY STRENGTH:   Strength in pounds assessed by hand-held dynamometer Right 05/18/22 Left 05/18/22 Left/Right in pounds 06/19/22  Hip flexion       Hip extension       Hip abduction       Hip adduction       Hip internal rotation       Hip external rotation       Knee flexion       Knee extension 19.2 17.3 32.7/43.2  Ankle dorsiflexion       Ankle plantarflexion       Ankle inversion       Ankle eversion        (Blank rows = not tested)    GAIT: Distance walked: In the clinic Assistive device utilized: None (was Quad cane small base) Level of assistance: Complete Independence Comments: Rarely uses the cane (previously used the cane almost of the time)     TODAY'S TREATMENT:  06/26/2022 Bridges 10X 5 seconds Quadriceps sets 10X 5 seconds Seated straight leg raises 3 sets of 5 for 3 seconds  Functional Activities: Double Leg Press 50# 25X slow eccentrics Single Leg Press 25# Bil 15X slow eccentrics Step-down off 4 inch step 2 sets of 10 slow eccentrics   06/25/2022 Bridges 10X 5 seconds Quadriceps sets 10X 5 seconds Seated straight leg raises 3 sets of 5 for 3 seconds  Functional Activities: Double Leg Press 50# 15X slow eccentrics Single Leg Press 25# Bil 10X slow eccentrics   06/19/22: Bridges 10X 5 seconds Quadriceps sets 10X 5 seconds Seated straight leg raises 3 sets of 5 for 3 seconds  Functional Activities: Reassessment, review imaging, gym and HEP    PATIENT EDUCATION:  Education details: exercise form and purpose, walking program or even consider walking in the pool to take stress off of knees   Person educated: Patient Education method: Explanation : verbalized understanding, returned demonstration, verbal cues required, tactile cues required, and needs further education   HOME EXERCISE PROGRAM: Access Code: [email or web address] URL: [email or web address] Date: 06/19/2022 Prepared by:  Vista Mink  Exercises - Supine Quadricep Sets  - 3-5 x daily - 7 x weekly - 2-3 sets - 10 reps - 5 second hold - Seated Knee Flexion AAROM  - 3 x daily - 3 x weekly - 1 sets - 1 reps - 3 minutes hold - Small Range Straight Leg Raise  - 3-5 x daily - 3 x weekly - 2 sets - 5 reps - 3 seconds hold - Yoga Bridge  - 1 x daily - 7 x weekly - 2-3 sets - 10 reps - 5 seconds hold    ASSESSMENT:   CLINICAL IMPRESSION: Verleen is doing a great job with her seated straight leg raises and bridging at home.   Quadriceps strength work remains the focus of her home and clinic program and Crystalee will be transitioned into independent rehabilitation over the next 1-3 weeks as strength continues to improve.  She had questions about hyaluronic acid and getting another "gel shot."  I encouraged her to talk to Dr. Ninfa Linden about this when she sees him next week.   OBJECTIVE IMPAIRMENTS: Abnormal gait, decreased activity tolerance, decreased endurance, decreased knowledge of condition, difficulty walking, decreased ROM, decreased strength, increased edema, and pain.    ACTIVITY LIMITATIONS: carrying, lifting, bending, sitting, standing, squatting, stairs, bed mobility, and locomotion level   PARTICIPATION LIMITATIONS: driving, shopping, and community activity   PERSONAL FACTORS: Type 2 DM, HLD, HTN, osteoporosis are also affecting patient's functional outcome.    REHAB POTENTIAL: Good   CLINICAL DECISION MAKING: Stable/uncomplicated   EVALUATION COMPLEXITY: Low     GOALS: Goals reviewed with patient? Yes   SHORT TERM GOALS: Target date: 07/13/2022 Emberlee will improve her bilateral quadriceps strength to at least 30 pounds Baseline: Less than 20 pounds Goal status: MET 06/19/22   2.  Deardra will improve her left knee flexion active range of motion to at least 110 degrees Baseline: 90 degrees Goal status: MET 05/26/22   3.  Abbigaile will demonstrate independence with her day 1 home exercise program Baseline: Started 1120/2023 Goal status: MET 05/26/22       LONG TERM GOALS: Target date: 07/13/2021   Improve Foto to 63 Baseline: 59 Goal status: MET 06/19/2022   2.  [place]as will report bilateral knee pain consistently 0-4 out of 10 on the visual analog scale (VAS) Baseline: 3-7 out of 10 Goal status: On Going 06/26/2022   3.  Improve bilateral quadriceps strength to at least 50 pounds bilaterally Baseline: Less than 20 pounds Goal status: On Going 06/19/2022   4.  Improve bilateral knee flexion active  range of motion to 125 degrees and extension to 0 degrees Baseline: 90/124 and 0/-2 Goal status: On Going 06/19/2022   5.  Klarisa will be independent with her long-term home exercise program at discharge Baseline: Started 05/18/2022 Goal status: On Going 06/26/2022   PLAN:   PT FREQUENCY: 1-2x/week   PT DURATION: 8 weeks   PLANNED INTERVENTIONS: Therapeutic exercises, Therapeutic activity, Neuromuscular re-education, Balance training, Gait training, Patient/Family education, Self Care, Stair training, Cryotherapy, and Vasopneumatic device   PLAN FOR NEXT SESSION: Progress quadriceps strength, implement balance and functional activities as appropriate.   Farley Ly PT, MPT 06/26/22 9:00 AM   06/26/22  9:00 AM

## 2022-06-29 NOTE — Congregational Nurse Program (Signed)
Member seen at [facility] . Member being baptized again this morning.  Praising God.  Using cane today.  Vinnie Langton, RN, [place] Nurse, [phone].

## 2022-06-30 ENCOUNTER — Ambulatory Visit (INDEPENDENT_AMBULATORY_CARE_PROVIDER_SITE_OTHER): Payer: Medicare Other | Admitting: Rehabilitative and Restorative Service Providers"

## 2022-06-30 ENCOUNTER — Encounter: Payer: Self-pay | Admitting: Rehabilitative and Restorative Service Providers"

## 2022-06-30 DIAGNOSIS — M25662 Stiffness of left knee, not elsewhere classified: Secondary | ICD-10-CM | POA: Diagnosis not present

## 2022-06-30 DIAGNOSIS — M25562 Pain in left knee: Secondary | ICD-10-CM | POA: Diagnosis not present

## 2022-06-30 DIAGNOSIS — M25661 Stiffness of right knee, not elsewhere classified: Secondary | ICD-10-CM | POA: Diagnosis not present

## 2022-06-30 DIAGNOSIS — R262 Difficulty in walking, not elsewhere classified: Secondary | ICD-10-CM | POA: Diagnosis not present

## 2022-06-30 DIAGNOSIS — R600 Localized edema: Secondary | ICD-10-CM | POA: Diagnosis not present

## 2022-06-30 DIAGNOSIS — M25561 Pain in right knee: Secondary | ICD-10-CM

## 2022-06-30 NOTE — Therapy (Signed)
OUTPATIENT PHYSICAL THERAPY TREATMENT NOTE   Patient Name: Amy Escobar MRN: [id number] DOB:1946-05-13, 77 y.o., female Today's Date: 06/30/2022  PCP: [name], FNP  REFERRING PROVIDER: Mcarthur Rossetti, MD   END OF SESSION:   PT End of Session - 06/30/22 4259     Visit Number 10    Number of Visits 16    Progress Note Due on Visit 17    PT Start Time 0805    PT Stop Time 0845    PT Time Calculation (min) 40 min    Activity Tolerance Patient tolerated treatment well;No increased pain    Behavior During Therapy WFL for tasks assessed/performed               Past Medical History:  Diagnosis Date   Alkaline phosphatase deficiency    w/u Ne   Allergic rhinitis    Allergy    Anxiety    Arthritis    Cataract    BILATERAL-REMOVED   DM2 (diabetes mellitus, type 2) (HCC)    GERD (gastroesophageal reflux disease)    Gout    Hemorrhoids    Hyperlipidemia    Hypertension    Mild pulmonary hypertension (HCC)    NSVT (nonsustained ventricular tachycardia) (HCC)    Osteoporosis    PVC's (premature ventricular contractions)    Thyroid nodule    small   Tubular adenoma of colon 2017   Urticaria    Uterine fibroid    Past Surgical History:  Procedure Laterality Date   CATARACT EXTRACTION Bilateral 2016   COLONOSCOPY  2007   echocardiogram (other)  01/16/2002   POLYPECTOMY     removed tumors from foot nerves  04/1999   stress cardiolite  02/12/2006   TOENAIL EXCISION     TUBAL LIGATION     TYMPANOSTOMY TUBE PLACEMENT     Patient Active Problem List   Diagnosis Date Noted   Type 2 diabetes mellitus with diabetic polyneuropathy, without long-term current use of insulin ([place]) 06/18/2021   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 without macular edema, without long-term current use of insulin ([place]) 06/18/2021   Diabetes mellitus ([place]) 06/18/2021   Osteoarthritis of right AC (acromioclavicular)  joint 03/06/2021   Osteoarthritis of right glenohumeral joint 03/06/2021   Foot pain, bilateral 12/26/2020   Diabetic neuropathy ([place]) 01/10/2020   Hav (hallux abducto valgus), unspecified laterality 01/10/2020   Chronic arthropathy 01/10/2020   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 edema associated with type 2 diabetes mellitus ([place]) 12/26/2019   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   AC (acromioclavicular) arthritis 08/02/2019   Unspecified inflammatory spondylopathy, 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[place]) 04/19/2019   Claudication ([place]) 04/19/2019   Morbid obesity ([place]) 04/19/2019   Suspected COVID-19 virus infection 04/13/2019   Upper airway cough syndrome 08/22/2018   Greater trochanteric bursitis of both hips 06/15/2018   Trigger point of shoulder region, left 02/07/2018   Hypertension    Mild pulmonary hypertension (HCC)    NSVT (nonsustained ventricular tachycardia) (HCC)    PVC's (premature ventricular contractions)    URI (upper respiratory infection) 08/09/2016   Neck pain 02/10/2016   Urinary incontinence 02/10/2016   Degenerative arthritis of knee, bilateral 07/17/2015   Knee MCL sprain 06/07/2015   Discomfort in chest 02/15/2015   Chest pain 02/15/2015   DM type 2, controlled, with complication ([place])    Wellness examination 08/06/2014   Acute meniscal tear of knee 02/13/2014   Primary localized  osteoarthrosis, lower leg 02/13/2014   Gastrocnemius tear 12/25/2013   UTI (urinary tract infection) 12/20/2013   Right knee pain 12/20/2013   Pulmonary hypertension ([place]) 10/06/2013   DOE (dyspnea on exertion) 08/04/2013   Dysphagia, unspecified(787.20) 08/10/2012   Hip pain, left 02/02/2012   Painful respiration 05/25/2011   Encounter for long-term (current) use of other medications 01/30/2011   PELVIC PAIN, LEFT 07/30/2010   TOBACCO USE, QUIT 07/07/2010   FATIGUE 12/20/2009   Headache 12/20/2009   Low back pain 12/26/2008   Disorder  of liver 04/13/2008   FOOT PAIN, BILATERAL 04/13/2008   FIBROIDS, UTERUS 11/24/2007   THYROID NODULE, LEFT 11/24/2007   HYPERCHOLESTEROLEMIA 11/24/2007   CARPAL TUNNEL SYNDROME, BILATERAL 11/24/2007   ALKALINE PHOSPHATASE, ELEVATED 11/24/2007   Gout 08/10/2007   ANXIETY 08/10/2007   Essential hypertension 08/10/2007   Cough 08/01/2007   Perennial and seasonal allergic rhinitis 01/14/2007   GERD 01/14/2007   Osteoporosis 01/14/2007    REFERRING DIAG:  M25.562,G89.29 (ICD-10-CM) - Chronic pain of left knee  M25.561,G89.29 (ICD-10-CM) - Chronic pain of right knee  S83.232D (ICD-10-CM) - Complex tear of medial meniscus of left knee as current injury, subsequent encounter    THERAPY DIAG:  Difficulty in walking, not elsewhere classified  Localized edema  Stiffness of left knee, not elsewhere classified  Stiffness of right knee, not elsewhere classified  Pain in both knees, unspecified chronicity  Rationale for Evaluation and Treatment Rehabilitation  PERTINENT HISTORY: Type 2 DM, HLD, HTN, osteoporosis   PRECAUTIONS: none  SUBJECTIVE:                                                                                                                                                                                      SUBJECTIVE STATEMENT:   [name] is feeling the cold weather this morning.  Once she got warmed up and moving, things improved significantly.  PAIN:  Are you having pain? Yes, left knee 8/10, right knee 8/10 with the cold this morning Description: Ache Location: B knees Aggravating factors: Prolonged postures, rain, cold weather Relieving factors: Ice, exercise, movement    OBJECTIVE: (objective measures completed at initial evaluation unless otherwise dated) DIAGNOSTIC FINDINGS:    Left knee IMPRESSION: 1. Root tear of the posterior horn of the medial meniscus with resulting mild peripheral extrusion of the meniscus from the joint space. 2. Mild medial and  patellofemoral compartment degenerative chondrosis. No acute osseous findings. 3. Intact lateral meniscus, cruciate and collateral ligaments. 4. Heterogeneous, serpiginous T2 hyperintensity centrally and laterally in Hoffa's fat may reflect a venous malformation or atypical ganglion.   Right knee IMPRESSION: 1. Chronic root tear of the posterior horn of  the medial meniscus with progressive peripheral extrusion of the meniscus from the joint space. 2. Progressive medial compartment osteoarthritis. No acute osseous findings. 3. The lateral meniscus, cruciate and collateral ligaments are intact. 4. Small joint effusion and small Baker's cyst.   PATIENT SURVEYS:  06/19/2022 FOTO 78 (Goal met)  05/18/2022 FOTO 59 (Goal 63 in 12 visits)   COGNITION: Overall cognitive status: Within functional limits for tasks assessed                         SENSATION: No complaints of peripheral paresthesias, new tingling or numbness   EDEMA:  Noted and not objectively assessed for bilateral knees   LOWER EXTREMITY ROM:   Active ROM Right eval Left eval Rt/Left 05/26/22 Rt/left 06/03/22 Rt/Lt 06/30/2021  Hip flexion         Hip extension         Hip abduction         Hip adduction         Hip internal rotation         Hip external rotation         Knee flexion 124 90 122/110 122/114 134/132  Knee extension -2 0 0/0 0/0 0/0  Ankle dorsiflexion         Ankle plantarflexion         Ankle inversion         Ankle eversion          (Blank rows = not tested)   LOWER EXTREMITY STRENGTH:   Strength in pounds assessed by hand-held dynamometer Right 05/18/22 Left 05/18/22 Left/Right in pounds 06/19/22  Hip flexion       Hip extension       Hip abduction       Hip adduction       Hip internal rotation       Hip external rotation       Knee flexion       Knee extension 19.2 17.3 32.7/43.2  Ankle dorsiflexion       Ankle plantarflexion       Ankle inversion       Ankle eversion         (Blank rows = not tested)   GAIT: Distance walked: In the clinic Assistive device utilized: None (was Quad cane small base) Level of assistance: Complete Independence Comments: Rarely uses the cane (previously used the cane almost of the time)     TODAY'S TREATMENT:  06/30/2021 Bridges 10X 5 seconds Quadriceps sets 10X 5 seconds Seated straight leg raises 4 sets of 5 for 3 seconds with 1# weight  Functional Activities: Double Leg Press 50# 25X slow eccentrics Single Leg Press 25# Bil 15X slow eccentrics Step-down off 6 inch step 10X slow eccentrics Step-up and over 6 inch step 10X slow eccentrics   06/26/2022 Bridges 10X 5 seconds Quadriceps sets 10X 5 seconds Seated straight leg raises 3 sets of 5 for 3 seconds  Functional Activities: Double Leg Press 50# 25X slow eccentrics Single Leg Press 25# Bil 15X slow eccentrics Step-down off 4 inch step 2 sets of 10 slow eccentrics   06/25/2022 Bridges 10X 5 seconds Quadriceps sets 10X 5 seconds Seated straight leg raises 3 sets of 5 for 3 seconds  Functional Activities: Double Leg Press 50# 15X slow eccentrics Single Leg Press 25# Bil 10X slow eccentrics   PATIENT EDUCATION:  Education details: exercise form and purpose, walking program or even consider walking in  the pool to take stress off of knees   Person educated: Patient Education method: Explanation : verbalized understanding, returned demonstration, verbal cues required, tactile cues required, and needs further education   HOME EXERCISE PROGRAM: Access Code: [email or web address] URL: [email or web address] Date: 06/19/2022 Prepared by: Vista Mink  Exercises - Supine Quadricep Sets  - 3-5 x daily - 7 x weekly - 2-3 sets - 10 reps - 5 second hold - Seated Knee Flexion AAROM  - 3 x daily - 3 x weekly - 1 sets - 1 reps - 3 minutes hold - Small Range Straight Leg Raise  - 3-5 x daily - 3 x weekly - 2 sets - 5 reps - 3 seconds hold - Yoga Bridge  - 1 x daily -  7 x weekly - 2-3 sets - 10 reps - 5 seconds hold    ASSESSMENT:   CLINICAL IMPRESSION: Neelah is moving better than at her last reassessment.  AROM is significantly improved and she is doing a better job of getting herself warmed up in the morning so that painful periods are shorter in duration.  She would like to continue to maximize her quadriceps strength and learn as much as possible with supervised physical therapy before transferring into independent rehabilitation on her own.  We discussed doing another quadriceps strength measurement next week along with another FOTO before discharge.   OBJECTIVE IMPAIRMENTS: Abnormal gait, decreased activity tolerance, decreased endurance, decreased knowledge of condition, difficulty walking, decreased ROM, decreased strength, increased edema, and pain.    ACTIVITY LIMITATIONS: carrying, lifting, bending, sitting, standing, squatting, stairs, bed mobility, and locomotion level   PARTICIPATION LIMITATIONS: driving, shopping, and community activity   PERSONAL FACTORS: Type 2 DM, HLD, HTN, osteoporosis are also affecting patient's functional outcome.    REHAB POTENTIAL: Good   CLINICAL DECISION MAKING: Stable/uncomplicated   EVALUATION COMPLEXITY: Low     GOALS: Goals reviewed with patient? Yes   SHORT TERM GOALS: Target date: 07/13/2022 Wm will improve her bilateral quadriceps strength to at least 30 pounds Baseline: Less than 20 pounds Goal status: MET 06/19/22   2.  B[place] will improve her left knee flexion active range of motion to at least 110 degrees Baseline: 90 degrees Goal status: MET 05/26/22   3.  Kandas will demonstrate independence with her day 1 home exercise program Baseline: Started 1120/2023 Goal status: MET 05/26/22       LONG TERM GOALS: Target date: 07/13/2021   Improve Foto to 63 Baseline: 59 Goal status: MET 06/19/2022   2.  Renesmae will report bilateral knee pain consistently 0-4 out of 10 on the visual analog scale  (VAS) Baseline: 3-7 out of 10 Goal status: On Going 06/30/2022   3.  Improve bilateral quadriceps strength to at least 50 pounds bilaterally Baseline: Less than 20 pounds Goal status: On Going 06/30/2022   4.  Improve bilateral knee flexion active range of motion to 125 degrees and extension to 0 degrees Baseline: 90/124 and 0/-2 Goal status: Met 06/30/2022   5.  Syeda will be independent with her long-term home exercise program at discharge Baseline: Started 05/18/2022 Goal status: On Going 06/30/2022   PLAN:   PT FREQUENCY: 1-2x/week   PT DURATION: 8 weeks   PLANNED INTERVENTIONS: Therapeutic exercises, Therapeutic activity, Neuromuscular re-education, Balance training, Gait training, Patient/Family education, Self Care, Stair training, Cryotherapy, and Vasopneumatic device   PLAN FOR NEXT SESSION: Progress quadriceps strength, implement balance and functional activities as appropriate.   Farley Ly PT, MPT 06/30/22  11:46 AM   06/30/22  11:46 AM

## 2022-07-02 ENCOUNTER — Encounter: Payer: Medicare Other | Admitting: Rehabilitative and Restorative Service Providers"

## 2022-07-07 ENCOUNTER — Encounter: Payer: Self-pay | Admitting: Physical Therapy

## 2022-07-07 ENCOUNTER — Ambulatory Visit (INDEPENDENT_AMBULATORY_CARE_PROVIDER_SITE_OTHER): Payer: Medicare Other | Admitting: Physical Therapy

## 2022-07-07 DIAGNOSIS — M25562 Pain in left knee: Secondary | ICD-10-CM | POA: Diagnosis not present

## 2022-07-07 DIAGNOSIS — M25662 Stiffness of left knee, not elsewhere classified: Secondary | ICD-10-CM | POA: Diagnosis not present

## 2022-07-07 DIAGNOSIS — R600 Localized edema: Secondary | ICD-10-CM

## 2022-07-07 DIAGNOSIS — M25561 Pain in right knee: Secondary | ICD-10-CM | POA: Diagnosis not present

## 2022-07-07 DIAGNOSIS — M25661 Stiffness of right knee, not elsewhere classified: Secondary | ICD-10-CM

## 2022-07-07 DIAGNOSIS — R262 Difficulty in walking, not elsewhere classified: Secondary | ICD-10-CM

## 2022-07-07 NOTE — Therapy (Signed)
OUTPATIENT PHYSICAL THERAPY TREATMENT NOTE   Patient Name: Amy Escobar MRN: [id number] DOB:07/25/1945, 77 y.o., female Today's Date: 07/07/2022  PCP: [name], FNP  REFERRING PROVIDER: Mcarthur Rossetti, MD   END OF SESSION:   PT End of Session - 07/07/22 0818     Visit Number 11    Number of Visits 16    PT Start Time 0803    PT Stop Time 0272    PT Time Calculation (min) 40 min    Activity Tolerance Patient tolerated treatment well;No increased pain    Behavior During Therapy WFL for tasks assessed/performed               Past Medical History:  Diagnosis Date   Alkaline phosphatase deficiency    w/u Ne   Allergic rhinitis    Allergy    Anxiety    Arthritis    Cataract    BILATERAL-REMOVED   DM2 (diabetes mellitus, type 2) (HCC)    GERD (gastroesophageal reflux disease)    Gout    Hemorrhoids    Hyperlipidemia    Hypertension    Mild pulmonary hypertension (HCC)    NSVT (nonsustained ventricular tachycardia) (HCC)    Osteoporosis    PVC's (premature ventricular contractions)    Thyroid nodule    small   Tubular adenoma of colon 2017   Urticaria    Uterine fibroid    Past Surgical History:  Procedure Laterality Date   CATARACT EXTRACTION Bilateral 2016   COLONOSCOPY  2007   echocardiogram (other)  01/16/2002   POLYPECTOMY     removed tumors from foot nerves  04/1999   stress cardiolite  02/12/2006   TOENAIL EXCISION     TUBAL LIGATION     TYMPANOSTOMY TUBE PLACEMENT     Patient Active Problem List   Diagnosis Date Noted   Type 2 diabetes mellitus with diabetic polyneuropathy, without long-term current use of insulin ([place]) 06/18/2021   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 without macular edema, without long-term current use of insulin ([place]) 06/18/2021   Diabetes mellitus ([place]) 06/18/2021   Osteoarthritis of right AC (acromioclavicular) joint 03/06/2021   Osteoarthritis of  right glenohumeral joint 03/06/2021   Foot pain, bilateral 12/26/2020   Diabetic neuropathy ([place]) 01/10/2020   Hav (hallux abducto valgus), unspecified laterality 01/10/2020   Chronic arthropathy 01/10/2020   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 edema associated with type 2 diabetes mellitus ([place]) 12/26/2019   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   AC (acromioclavicular) arthritis 08/02/2019   Unspecified inflammatory spondylopathy, 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[place]) 04/19/2019   Claudication ([place]) 04/19/2019   Morbid obesity ([place]) 04/19/2019   Suspected COVID-19 virus infection 04/13/2019   Upper airway cough syndrome 08/22/2018   Greater trochanteric bursitis of both hips 06/15/2018   Trigger point of shoulder region, left 02/07/2018   Hypertension    Mild pulmonary hypertension (HCC)    NSVT (nonsustained ventricular tachycardia) (HCC)    PVC's (premature ventricular contractions)    URI (upper respiratory infection) 08/09/2016   Neck pain 02/10/2016   Urinary incontinence 02/10/2016   Degenerative arthritis of knee, bilateral 07/17/2015   Knee MCL sprain 06/07/2015   Discomfort in chest 02/15/2015   Chest pain 02/15/2015   DM type 2, controlled, with complication ([place])    Wellness examination 08/06/2014   Acute meniscal tear of knee 02/13/2014   Primary localized osteoarthrosis, lower leg 02/13/2014   Gastrocnemius tear 12/25/2013  UTI (urinary tract infection) 12/20/2013   Right knee pain 12/20/2013   Pulmonary hypertension ([place]) 10/06/2013   DOE (dyspnea on exertion) 08/04/2013   Dysphagia, unspecified(787.20) 08/10/2012   Hip pain, left 02/02/2012   Painful respiration 05/25/2011   Encounter for long-term (current) use of other medications 01/30/2011   PELVIC PAIN, LEFT 07/30/2010   TOBACCO USE, QUIT 07/07/2010   FATIGUE 12/20/2009   Headache 12/20/2009   Low back pain 12/26/2008   Disorder of liver 04/13/2008   FOOT PAIN,  BILATERAL 04/13/2008   FIBROIDS, UTERUS 11/24/2007   THYROID NODULE, LEFT 11/24/2007   HYPERCHOLESTEROLEMIA 11/24/2007   CARPAL TUNNEL SYNDROME, BILATERAL 11/24/2007   ALKALINE PHOSPHATASE, ELEVATED 11/24/2007   Gout 08/10/2007   ANXIETY 08/10/2007   Essential hypertension 08/10/2007   Cough 08/01/2007   Perennial and seasonal allergic rhinitis 01/14/2007   GERD 01/14/2007   Osteoporosis 01/14/2007    REFERRING DIAG:  M25.562,G89.29 (ICD-10-CM) - Chronic pain of left knee  M25.561,G89.29 (ICD-10-CM) - Chronic pain of right knee  S83.232D (ICD-10-CM) - Complex tear of medial meniscus of left knee as current injury, subsequent encounter    THERAPY DIAG:  Difficulty in walking, not elsewhere classified  Localized edema  Stiffness of left knee, not elsewhere classified  Stiffness of right knee, not elsewhere classified  Pain in both knees, unspecified chronicity  Rationale for Evaluation and Treatment Rehabilitation  PERTINENT HISTORY: Type 2 DM, HLD, HTN, osteoporosis   PRECAUTIONS: none  SUBJECTIVE:                                                                                                                                                                                      SUBJECTIVE STATEMENT:   Pt stating pain in her left medial knee is 7/10. Pt stating Rt knee pain is 5/10 anterior knee. Pt stating she still is not able to clean her home like she wants. Pt staing when her pain increases she rubs her knee which seems to help some.   PAIN:  Are you having pain? Yes, see above Description: Ache Location: B knees Aggravating factors: Prolonged postures, rain, cold weather Relieving factors: Ice, exercise, movement    OBJECTIVE: (objective measures completed at initial evaluation unless otherwise dated) DIAGNOSTIC FINDINGS:    Left knee IMPRESSION: 1. Root tear of the posterior horn of the medial meniscus with resulting mild peripheral extrusion of the  meniscus from the joint space. 2. Mild medial and patellofemoral compartment degenerative chondrosis. No acute osseous findings. 3. Intact lateral meniscus, cruciate and collateral ligaments. 4. Heterogeneous, serpiginous T2 hyperintensity centrally and laterally in Hoffa's fat may reflect a venous malformation or atypical ganglion.   Right knee  IMPRESSION: 1. Chronic root tear of the posterior horn of the medial meniscus with progressive peripheral extrusion of the meniscus from the joint space. 2. Progressive medial compartment osteoarthritis. No acute osseous findings. 3. The lateral meniscus, cruciate and collateral ligaments are intact. 4. Small joint effusion and small Baker's cyst.   PATIENT SURVEYS:  06/19/2022 FOTO 78 (Goal met)  05/18/2022 FOTO 59 (Goal 63 in 12 visits)   COGNITION: Overall cognitive status: Within functional limits for tasks assessed                         SENSATION: No complaints of peripheral paresthesias, new tingling or numbness   EDEMA:  Noted and not objectively assessed for bilateral knees   LOWER EXTREMITY ROM:   Active ROM Right eval Left eval Rt/Left 05/26/22 Rt/left 06/03/22 Rt/Lt 06/30/2021  Hip flexion         Hip extension         Hip abduction         Hip adduction         Hip internal rotation         Hip external rotation         Knee flexion 124 90 122/110 122/114 134/132  Knee extension -2 0 0/0 0/0 0/0  Ankle dorsiflexion         Ankle plantarflexion         Ankle inversion         Ankle eversion          (Blank rows = not tested)   LOWER EXTREMITY STRENGTH:   Strength in pounds assessed by hand-held dynamometer Right 05/18/22 Left 05/18/22 Left/Right in pounds 06/19/22 Left/Rt 07/07/21  Hip flexion        Hip extension        Hip abduction        Hip adduction        Hip internal rotation        Hip external rotation        Knee flexion        Knee extension 19.2 17.3 32.7/43.2 35.8/38.8  Ankle  dorsiflexion        Ankle plantarflexion        Ankle inversion        Ankle eversion         (Blank rows = not tested)   GAIT: Distance walked: In the clinic Assistive device utilized: None (was Quad cane small base) Level of assistance: Complete Independence Comments: Rarely uses the cane (previously used the cane almost of the time)     TODAY'S TREATMENT:  07/07/22:  TherEx:  Scifit: Level 3 x 8 minutes Leg Press: bil LE's 62# x 20 Leg Press: single LE  31#  2 x 10  Slant board: x 1 minute Sit to stand: x 10 from mat at lowest level  Lateral step ups on 6 inch step x 20   Measurements taken again this visit (see chart above).    06/26/2022 Bridges 10X 5 seconds Quadriceps sets 10X 5 seconds Seated straight leg raises 3 sets of 5 for 3 seconds  Functional Activities: Double Leg Press 50# 25X slow eccentrics Single Leg Press 25# Bil 15X slow eccentrics Step-down off 4 inch step 2 sets of 10 slow eccentrics      PATIENT EDUCATION:  Education details: exercise form and purpose, walking program or even consider walking in the pool to take stress off of knees  Person educated: Patient Education method: Explanation : verbalized understanding, returned demonstration, verbal cues required, tactile cues required, and needs further education   HOME EXERCISE PROGRAM: Access Code: [email or web address] URL: [email or web address] Date: 06/19/2022 Prepared by: Vista Mink  Exercises - Supine Quadricep Sets  - 3-5 x daily - 7 x weekly - 2-3 sets - 10 reps - 5 second hold - Seated Knee Flexion AAROM  - 3 x daily - 3 x weekly - 1 sets - 1 reps - 3 minutes hold - Small Range Straight Leg Raise  - 3-5 x daily - 3 x weekly - 2 sets - 5 reps - 3 seconds hold - Yoga Bridge  - 1 x daily - 7 x weekly - 2-3 sets - 10 reps - 5 seconds hold    ASSESSMENT:   CLINICAL IMPRESSION: Pt tolerating exercises today with slight improvements in her bilateral knee strength based on HHD  measurements. Pt still reporting moderate pain in bilateral knees. Pt left her lights on in her car and had to leave a few minutes early. Continue skilled PT to progress toward LTG's set.    OBJECTIVE IMPAIRMENTS: Abnormal gait, decreased activity tolerance, decreased endurance, decreased knowledge of condition, difficulty walking, decreased ROM, decreased strength, increased edema, and pain.    ACTIVITY LIMITATIONS: carrying, lifting, bending, sitting, standing, squatting, stairs, bed mobility, and locomotion level   PARTICIPATION LIMITATIONS: driving, shopping, and community activity   PERSONAL FACTORS: Type 2 DM, HLD, HTN, osteoporosis are also affecting patient's functional outcome.    REHAB POTENTIAL: Good   CLINICAL DECISION MAKING: Stable/uncomplicated   EVALUATION COMPLEXITY: Low     GOALS: Goals reviewed with patient? Yes   SHORT TERM GOALS: Target date: 07/13/2022 Vianny will improve her bilateral quadriceps strength to at least 30 pounds Baseline: Less than 20 pounds Goal status: MET 06/19/22   2.  Oluwaseun will improve her left knee flexion active range of motion to at least 110 degrees Baseline: 90 degrees Goal status: MET 05/26/22   3.  Eveleigh will demonstrate independence with her day 1 home exercise program Baseline: Started 1120/2023 Goal status: MET 05/26/22       LONG TERM GOALS: Target date: 07/13/2021   Improve Foto to 63 Baseline: 59 Goal status: MET 06/19/2022   2.  Brylynn will report bilateral knee pain consistently 0-4 out of 10 on the visual analog scale (VAS) Baseline: 3-7 out of 10 Goal status: On Going 06/30/2022   3.  Improve bilateral quadriceps strength to at least 50 pounds bilaterally Baseline: Less than 20 pounds Goal status: On Going 06/30/2022   4.  Improve bilateral knee flexion active range of motion to 125 degrees and extension to 0 degrees Baseline: 90/124 and 0/-2 Goal status: Met 06/30/2022   5.  Mearl will be independent with her long-term  home exercise program at discharge Baseline: Started 05/18/2022 Goal status: On Going 06/30/2022   PLAN:   PT FREQUENCY: 1-2x/week   PT DURATION: 8 weeks   PLANNED INTERVENTIONS: Therapeutic exercises, Therapeutic activity, Neuromuscular re-education, Balance training, Gait training, Patient/Family education, Self Care, Stair training, Cryotherapy, and Vasopneumatic device   PLAN FOR NEXT SESSION: Progress quadriceps strength, implement balance and functional activities as appropriate.   Kearney Hard, PT, MPT 07/07/22 8:42 AM   07/07/22 8:42 AM   07/07/22  8:42 AM

## 2022-07-08 DIAGNOSIS — I10 Essential (primary) hypertension: Secondary | ICD-10-CM | POA: Diagnosis not present

## 2022-07-08 DIAGNOSIS — E119 Type 2 diabetes mellitus without complications: Secondary | ICD-10-CM | POA: Diagnosis not present

## 2022-07-09 ENCOUNTER — Ambulatory Visit (INDEPENDENT_AMBULATORY_CARE_PROVIDER_SITE_OTHER): Payer: 59 | Admitting: Rehabilitative and Restorative Service Providers"

## 2022-07-09 ENCOUNTER — Encounter: Payer: Self-pay | Admitting: Rehabilitative and Restorative Service Providers"

## 2022-07-09 DIAGNOSIS — M25662 Stiffness of left knee, not elsewhere classified: Secondary | ICD-10-CM | POA: Diagnosis not present

## 2022-07-09 DIAGNOSIS — M25561 Pain in right knee: Secondary | ICD-10-CM

## 2022-07-09 DIAGNOSIS — R600 Localized edema: Secondary | ICD-10-CM | POA: Diagnosis not present

## 2022-07-09 DIAGNOSIS — M25661 Stiffness of right knee, not elsewhere classified: Secondary | ICD-10-CM | POA: Diagnosis not present

## 2022-07-09 DIAGNOSIS — R262 Difficulty in walking, not elsewhere classified: Secondary | ICD-10-CM

## 2022-07-09 DIAGNOSIS — M25562 Pain in left knee: Secondary | ICD-10-CM | POA: Diagnosis not present

## 2022-07-09 NOTE — Therapy (Signed)
OUTPATIENT PHYSICAL THERAPY TREATMENT NOTE   Patient Name: Amy Escobar MRN: [id number] DOB:1945-09-15, 77 y.o., female Today's Date: 07/09/2022  PCP: [name], FNP  REFERRING PROVIDER: Mcarthur Rossetti, MD   END OF SESSION:   PT End of Session - 07/09/22 0808     Visit Number 12    Number of Visits 16    PT Start Time 0803    PT Stop Time 0843    PT Time Calculation (min) 40 min    Activity Tolerance Patient tolerated treatment well;No increased pain;Patient limited by fatigue    Behavior During Therapy [facility] for tasks assessed/performed                Past Medical History:  Diagnosis Date   Alkaline phosphatase deficiency    w/u Ne   Allergic rhinitis    Allergy    Anxiety    Arthritis    Cataract    BILATERAL-REMOVED   DM2 (diabetes mellitus, type 2) (HCC)    GERD (gastroesophageal reflux disease)    Gout    Hemorrhoids    Hyperlipidemia    Hypertension    Mild pulmonary hypertension (HCC)    NSVT (nonsustained ventricular tachycardia) (HCC)    Osteoporosis    PVC's (premature ventricular contractions)    Thyroid nodule    small   Tubular adenoma of colon 2017   Urticaria    Uterine fibroid    Past Surgical History:  Procedure Laterality Date   CATARACT EXTRACTION Bilateral 2016   COLONOSCOPY  2007   echocardiogram (other)  01/16/2002   POLYPECTOMY     removed tumors from foot nerves  04/1999   stress cardiolite  02/12/2006   TOENAIL EXCISION     TUBAL LIGATION     TYMPANOSTOMY TUBE PLACEMENT     Patient Active Problem List   Diagnosis Date Noted   Type 2 diabetes mellitus with diabetic polyneuropathy, without long-term current use of insulin ([place]) 06/18/2021   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 without macular edema, without long-term current use of insulin ([place]) 06/18/2021   Diabetes mellitus ([place]) 06/18/2021   Osteoarthritis of right AC (acromioclavicular) joint  03/06/2021   Osteoarthritis of right glenohumeral joint 03/06/2021   Foot pain, bilateral 12/26/2020   Diabetic neuropathy ([place]) 01/10/2020   Hav (hallux abducto valgus), unspecified laterality 01/10/2020   Chronic arthropathy 01/10/2020   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 edema associated with type 2 diabetes mellitus ([place]) 12/26/2019   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   AC (acromioclavicular) arthritis 08/02/2019   Unspecified inflammatory spondylopathy, 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[place]) 04/19/2019   Claudication ([place]) 04/19/2019   Morbid obesity ([place]) 04/19/2019   Suspected COVID-19 virus infection 04/13/2019   Upper airway cough syndrome 08/22/2018   Greater trochanteric bursitis of both hips 06/15/2018   Trigger point of shoulder region, left 02/07/2018   Hypertension    Mild pulmonary hypertension (HCC)    NSVT (nonsustained ventricular tachycardia) (HCC)    PVC's (premature ventricular contractions)    URI (upper respiratory infection) 08/09/2016   Neck pain 02/10/2016   Urinary incontinence 02/10/2016   Degenerative arthritis of knee, bilateral 07/17/2015   Knee MCL sprain 06/07/2015   Discomfort in chest 02/15/2015   Chest pain 02/15/2015   DM type 2, controlled, with complication ([place])    Wellness examination 08/06/2014   Acute meniscal tear of knee 02/13/2014   Primary localized osteoarthrosis, lower leg 02/13/2014  Gastrocnemius tear 12/25/2013   UTI (urinary tract infection) 12/20/2013   Right knee pain 12/20/2013   Pulmonary hypertension ([place]) 10/06/2013   DOE (dyspnea on exertion) 08/04/2013   Dysphagia, unspecified(787.20) 08/10/2012   Hip pain, left 02/02/2012   Painful respiration 05/25/2011   Encounter for long-term (current) use of other medications 01/30/2011   PELVIC PAIN, LEFT 07/30/2010   TOBACCO USE, QUIT 07/07/2010   FATIGUE 12/20/2009   Headache 12/20/2009   Low back pain 12/26/2008   Disorder of  liver 04/13/2008   FOOT PAIN, BILATERAL 04/13/2008   FIBROIDS, UTERUS 11/24/2007   THYROID NODULE, LEFT 11/24/2007   HYPERCHOLESTEROLEMIA 11/24/2007   CARPAL TUNNEL SYNDROME, BILATERAL 11/24/2007   ALKALINE PHOSPHATASE, ELEVATED 11/24/2007   Gout 08/10/2007   ANXIETY 08/10/2007   Essential hypertension 08/10/2007   Cough 08/01/2007   Perennial and seasonal allergic rhinitis 01/14/2007   GERD 01/14/2007   Osteoporosis 01/14/2007    REFERRING DIAG:  M25.562,G89.29 (ICD-10-CM) - Chronic pain of left knee  M25.561,G89.29 (ICD-10-CM) - Chronic pain of right knee  S83.232D (ICD-10-CM) - Complex tear of medial meniscus of left knee as current injury, subsequent encounter    THERAPY DIAG:  Difficulty in walking, not elsewhere classified  Localized edema  Stiffness of left knee, not elsewhere classified  Stiffness of right knee, not elsewhere classified  Pain in both knees, unspecified chronicity  Rationale for Evaluation and Treatment Rehabilitation  PERTINENT HISTORY: Type 2 DM, HLD, HTN, osteoporosis   PRECAUTIONS: none  SUBJECTIVE:                                                                                                                                                                                      SUBJECTIVE STATEMENT:   [name] rates her pain in her left medial knee at 7/10.  Rt knee pain is 5/10 anterior knee.  She reports "fair" HEP compliance.  PAIN:  Are you having pain? Yes, see above Description: Ache Location: B knees Aggravating factors: Prolonged postures, rain, cold weather Relieving factors: Ice, exercise, movement    OBJECTIVE: (objective measures completed at initial evaluation unless otherwise dated) DIAGNOSTIC FINDINGS:    Left knee IMPRESSION: 1. Root tear of the posterior horn of the medial meniscus with resulting mild peripheral extrusion of the meniscus from the joint space. 2. Mild medial and patellofemoral compartment  degenerative chondrosis. No acute osseous findings. 3. Intact lateral meniscus, cruciate and collateral ligaments. 4. Heterogeneous, serpiginous T2 hyperintensity centrally and laterally in Hoffa's fat may reflect a venous malformation or atypical ganglion.   Right knee IMPRESSION: 1. Chronic root tear of the posterior horn of the medial meniscus with progressive peripheral extrusion of the  meniscus from the joint space. 2. Progressive medial compartment osteoarthritis. No acute osseous findings. 3. The lateral meniscus, cruciate and collateral ligaments are intact. 4. Small joint effusion and small Baker's cyst.   PATIENT SURVEYS:  06/19/2022 FOTO 78 (Goal met)  05/18/2022 FOTO 59 (Goal 63 in 12 visits)   COGNITION: Overall cognitive status: Within functional limits for tasks assessed                         SENSATION: No complaints of peripheral paresthesias, new tingling or numbness   EDEMA:  Noted and not objectively assessed for bilateral knees   LOWER EXTREMITY ROM:   Active ROM Right eval Left eval Rt/Left 05/26/22 Rt/left 06/03/22 Rt/Lt 06/30/2021  Hip flexion         Hip extension         Hip abduction         Hip adduction         Hip internal rotation         Hip external rotation         Knee flexion 124 90 122/110 122/114 134/132  Knee extension -2 0 0/0 0/0 0/0  Ankle dorsiflexion         Ankle plantarflexion         Ankle inversion         Ankle eversion          (Blank rows = not tested)   LOWER EXTREMITY STRENGTH:   Strength in pounds assessed by hand-held dynamometer Right 05/18/22 Left 05/18/22 Left/Right in pounds 06/19/22 Left/Rt 07/07/21  Hip flexion        Hip extension        Hip abduction        Hip adduction        Hip internal rotation        Hip external rotation        Knee flexion        Knee extension 19.2 17.3 32.7/43.2 35.8/38.8  Ankle dorsiflexion        Ankle plantarflexion        Ankle inversion        Ankle eversion          (Blank rows = not tested)   GAIT: Distance walked: In the clinic Assistive device utilized: None (was Quad cane small base) Level of assistance: Complete Independence Comments: Rarely uses the cane (previously used the cane almost of the time)     TODAY'S TREATMENT:  07/09/2022 Bridges 2 sets of 10 5 seconds Quadriceps sets 10X 5 seconds Seated straight leg raises 4 sets of 5 for 3 seconds with 1# weight   Functional Activities: Double Leg Press 50# 25X slow eccentrics Single Leg Press 25# Bil 15X slow eccentrics Step-down off 4 inch step 5X slow eccentrics Bil Step-up and over 6 & 8 inch step 10X slow eccentrics   07/07/22:  TherEx:  Scifit: Level 3 x 8 minutes Leg Press: bil LE's 62# x 20 Leg Press: single LE  31#  2 x 10  Slant board: x 1 minute Sit to stand: x 10 from mat at lowest level  Lateral step ups on 6 inch step x 20   Measurements taken again this visit (see chart above).   06/30/2021 Bridges 10X 5 seconds Quadriceps sets 10X 5 seconds Seated straight leg raises 4 sets of 5 for 3 seconds with 1# weight   Functional Activities: Double Leg Press 50#  25X slow eccentrics Single Leg Press 25# Bil 15X slow eccentrics Step-down off 6 inch step 10X slow eccentrics Step-up and over 6 inch step 10X slow eccentrics    PATIENT EDUCATION:  Education details: exercise form and purpose, walking program or even consider walking in the pool to take stress off of knees   Person educated: Patient Education method: Explanation : verbalized understanding, returned demonstration, verbal cues required, tactile cues required, and needs further education   HOME EXERCISE PROGRAM: Access Code: [email or web address] URL: [email or web address] Date: 06/19/2022 Prepared by: Vista Mink  Exercises - Supine Quadricep Sets  - 3-5 x daily - 7 x weekly - 2-3 sets - 10 reps - 5 second hold - Seated Knee Flexion AAROM  - 3 x daily - 3 x weekly - 1 sets - 1 reps - 3 minutes  hold - Small Range Straight Leg Raise  - 3-5 x daily - 3 x weekly - 2 sets - 5 reps - 3 seconds hold - Yoga Bridge  - 1 x daily - 7 x weekly - 2-3 sets - 10 reps - 5 seconds hold    ASSESSMENT:   CLINICAL IMPRESSION: Shy sees Dr. Ninfa Linden on January 31st.  We discussed the importance of consistent HEP compliance, particularly with quadriceps strengthening activities.  Specifically, quad sets, seated straight leg raises and bridges.  Continue current emphasis on quadriceps strength with progress note in 1-2 visits pre-MD visit.    OBJECTIVE IMPAIRMENTS: Abnormal gait, decreased activity tolerance, decreased endurance, decreased knowledge of condition, difficulty walking, decreased ROM, decreased strength, increased edema, and pain.    ACTIVITY LIMITATIONS: carrying, lifting, bending, sitting, standing, squatting, stairs, bed mobility, and locomotion level   PARTICIPATION LIMITATIONS: driving, shopping, and community activity   PERSONAL FACTORS: Type 2 DM, HLD, HTN, osteoporosis are also affecting patient's functional outcome.    REHAB POTENTIAL: Good   CLINICAL DECISION MAKING: Stable/uncomplicated   EVALUATION COMPLEXITY: Low     GOALS: Goals reviewed with patient? Yes   SHORT TERM GOALS: Target date: 07/13/2022 Lorilei will improve her bilateral quadriceps strength to at least 30 pounds Baseline: Less than 20 pounds Goal status: MET 06/19/22   2.  Tari will improve her left knee flexion active range of motion to at least 110 degrees Baseline: 90 degrees Goal status: MET 05/26/22   3.  Kynslei will demonstrate independence with her day 1 home exercise program Baseline: Started 1120/2023 Goal status: MET 05/26/22       LONG TERM GOALS: Target date: 07/13/2021   Improve Foto to 63 Baseline: 59 Goal status: MET 06/19/2022   2.  Kendre will report bilateral knee pain consistently 0-4 out of 10 on the visual analog scale (VAS) Baseline: 3-7 out of 10 Goal status: On Going  07/09/2022   3.  Improve bilateral quadriceps strength to at least 50 pounds bilaterally Baseline: Less than 20 pounds Goal status: On Going 07/09/2022   4.  Improve bilateral knee flexion active range of motion to 125 degrees and extension to 0 degrees Baseline: 90/124 and 0/-2 Goal status: Met 06/30/2022   5.  Ethyl will be independent with her long-term home exercise program at discharge Baseline: Started 05/18/2022 Goal status: On Going 07/09/2022   PLAN:   PT FREQUENCY: 1-2x/week   PT DURATION: 8 weeks   PLANNED INTERVENTIONS: Therapeutic exercises, Therapeutic activity, Neuromuscular re-education, Balance training, Gait training, Patient/Family education, Self Care, Stair training, Cryotherapy, and Vasopneumatic device   PLAN FOR NEXT SESSION: Progress quadriceps strength, implement balance and  functional activities as appropriate.   Farley Ly, PT, MPT 07/09/22 8:45 AM   07/09/22 8:45 AM   07/09/22  8:45 AM

## 2022-07-14 ENCOUNTER — Ambulatory Visit (INDEPENDENT_AMBULATORY_CARE_PROVIDER_SITE_OTHER): Payer: Medicare Other | Admitting: Physical Therapy

## 2022-07-14 ENCOUNTER — Encounter: Payer: Self-pay | Admitting: Physical Therapy

## 2022-07-14 DIAGNOSIS — R262 Difficulty in walking, not elsewhere classified: Secondary | ICD-10-CM

## 2022-07-14 DIAGNOSIS — M25562 Pain in left knee: Secondary | ICD-10-CM

## 2022-07-14 DIAGNOSIS — M25661 Stiffness of right knee, not elsewhere classified: Secondary | ICD-10-CM

## 2022-07-14 DIAGNOSIS — R600 Localized edema: Secondary | ICD-10-CM

## 2022-07-14 DIAGNOSIS — M25662 Stiffness of left knee, not elsewhere classified: Secondary | ICD-10-CM

## 2022-07-14 DIAGNOSIS — M25561 Pain in right knee: Secondary | ICD-10-CM

## 2022-07-14 NOTE — Therapy (Signed)
OUTPATIENT PHYSICAL THERAPY TREATMENT NOTE   Patient Name: Amy Escobar MRN: [id number] DOB:10/17/1945, 77 y.o., female Today's Date: 07/14/2022  PCP: [name], FNP  REFERRING PROVIDER: Mcarthur Rossetti, MD   END OF SESSION:   PT End of Session - 07/14/22 0832     Visit Number 13    Number of Visits 16    Progress Note Due on Visit 6256    PT Start Time 0810    PT Stop Time 0845    PT Time Calculation (min) 35 min    Activity Tolerance Patient tolerated treatment well;No increased pain;Patient limited by fatigue    Behavior During Therapy [facility] for tasks assessed/performed                Past Medical History:  Diagnosis Date   Alkaline phosphatase deficiency    w/u Ne   Allergic rhinitis    Allergy    Anxiety    Arthritis    Cataract    BILATERAL-REMOVED   DM2 (diabetes mellitus, type 2) ([place])    GERD (gastroesophageal reflux disease)    Gout    Hemorrhoids    Hyperlipidemia    Hypertension    Mild pulmonary hypertension (HCC)    NSVT (nonsustained ventricular tachycardia) (HCC)    Osteoporosis    PVC's (premature ventricular contractions)    Thyroid nodule    small   Tubular adenoma of colon 2017   Urticaria    Uterine fibroid    Past Surgical History:  Procedure Laterality Date   CATARACT EXTRACTION Bilateral 2016   COLONOSCOPY  2007   echocardiogram (other)  01/16/2002   POLYPECTOMY     removed tumors from foot nerves  04/1999   stress cardiolite  02/12/2006   TOENAIL EXCISION     TUBAL LIGATION     TYMPANOSTOMY TUBE PLACEMENT     Patient Active Problem List   Diagnosis Date Noted   Type 2 diabetes mellitus with diabetic polyneuropathy, without long-term current use of insulin ([place]) 06/18/2021   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 without macular edema, without long-term current use of insulin ([place]) 06/18/2021   Diabetes mellitus ([place]) 06/18/2021   Osteoarthritis of  right AC (acromioclavicular) joint 03/06/2021   Osteoarthritis of right glenohumeral joint 03/06/2021   Foot pain, bilateral 12/26/2020   Diabetic neuropathy ([place]) 01/10/2020   Hav (hallux abducto valgus), unspecified laterality 01/10/2020   Chronic arthropathy 01/10/2020   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 edema associated with type 2 diabetes mellitus ([place]) 12/26/2019   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   AC (acromioclavicular) arthritis 08/02/2019   Unspecified inflammatory spondylopathy, 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[place]) 04/19/2019   Claudication ([place]) 04/19/2019   Morbid obesity ([place]) 04/19/2019   Suspected COVID-19 virus infection 04/13/2019   Upper airway cough syndrome 08/22/2018   Greater trochanteric bursitis of both hips 06/15/2018   Trigger point of shoulder region, left 02/07/2018   Hypertension    Mild pulmonary hypertension (HCC)    NSVT (nonsustained ventricular tachycardia) (HCC)    PVC's (premature ventricular contractions)    URI (upper respiratory infection) 08/09/2016   Neck pain 02/10/2016   Urinary incontinence 02/10/2016   Degenerative arthritis of knee, bilateral 07/17/2015   Knee MCL sprain 06/07/2015   Discomfort in chest 02/15/2015   Chest pain 02/15/2015   DM type 2, controlled, with complication ([place])    Wellness examination 08/06/2014   Acute meniscal tear of knee 02/13/2014  Primary localized osteoarthrosis, lower leg 02/13/2014   Gastrocnemius tear 12/25/2013   UTI (urinary tract infection) 12/20/2013   Right knee pain 12/20/2013   Pulmonary hypertension ([place]) 10/06/2013   DOE (dyspnea on exertion) 08/04/2013   Dysphagia, unspecified(787.20) 08/10/2012   Hip pain, left 02/02/2012   Painful respiration 05/25/2011   Encounter for long-term (current) use of other medications 01/30/2011   PELVIC PAIN, LEFT 07/30/2010   TOBACCO USE, QUIT 07/07/2010   FATIGUE 12/20/2009   Headache 12/20/2009   Low back  pain 12/26/2008   Disorder of liver 04/13/2008   FOOT PAIN, BILATERAL 04/13/2008   FIBROIDS, UTERUS 11/24/2007   THYROID NODULE, LEFT 11/24/2007   HYPERCHOLESTEROLEMIA 11/24/2007   CARPAL TUNNEL SYNDROME, BILATERAL 11/24/2007   ALKALINE PHOSPHATASE, ELEVATED 11/24/2007   Gout 08/10/2007   ANXIETY 08/10/2007   Essential hypertension 08/10/2007   Cough 08/01/2007   Perennial and seasonal allergic rhinitis 01/14/2007   GERD 01/14/2007   Osteoporosis 01/14/2007    REFERRING DIAG:  M25.562,G89.29 (ICD-10-CM) - Chronic pain of left knee  M25.561,G89.29 (ICD-10-CM) - Chronic pain of right knee  S83.232D (ICD-10-CM) - Complex tear of medial meniscus of left knee as current injury, subsequent encounter    THERAPY DIAG:  Difficulty in walking, not elsewhere classified  Localized edema  Stiffness of left knee, not elsewhere classified  Stiffness of right knee, not elsewhere classified  Pain in both knees, unspecified chronicity  Rationale for Evaluation and Treatment Rehabilitation  PERTINENT HISTORY: Type 2 DM, HLD, HTN, osteoporosis   PRECAUTIONS: none  SUBJECTIVE:                                                                                                                                                                                      SUBJECTIVE STATEMENT:   No pain upon arrival. Pt stating she had pain all weekend, but no pain yesterday and today so far.   PAIN:  Are you having pain? None today, pain over weekend was 4-5/10 in her left knee.  Description: Ache Location: B knees Aggravating factors: Prolonged postures, rain, cold weather Relieving factors: Ice, exercise, movement    OBJECTIVE: (objective measures completed at initial evaluation unless otherwise dated) DIAGNOSTIC FINDINGS:    Left knee IMPRESSION: 1. Root tear of the posterior horn of the medial meniscus with resulting mild peripheral extrusion of the meniscus from the joint space. 2. Mild  medial and patellofemoral compartment degenerative chondrosis. No acute osseous findings. 3. Intact lateral meniscus, cruciate and collateral ligaments. 4. Heterogeneous, serpiginous T2 hyperintensity centrally and laterally in Hoffa's fat may reflect a venous malformation or atypical ganglion.   Right knee IMPRESSION: 1. Chronic root tear of the  posterior horn of the medial meniscus with progressive peripheral extrusion of the meniscus from the joint space. 2. Progressive medial compartment osteoarthritis. No acute osseous findings. 3. The lateral meniscus, cruciate and collateral ligaments are intact. 4. Small joint effusion and small Baker's cyst.   PATIENT SURVEYS:  06/19/2022 FOTO 78 (Goal met)  05/18/2022 FOTO 59 (Goal 63 in 12 visits)   COGNITION: Overall cognitive status: Within functional limits for tasks assessed                         SENSATION: No complaints of peripheral paresthesias, new tingling or numbness   EDEMA:  Noted and not objectively assessed for bilateral knees   LOWER EXTREMITY ROM:   Active ROM Right eval Left eval Rt/Left 05/26/22 Rt/left 06/03/22 Rt/Lt 06/30/2021  Hip flexion         Hip extension         Hip abduction         Hip adduction         Hip internal rotation         Hip external rotation         Knee flexion 124 90 122/110 122/114 134/132  Knee extension -2 0 0/0 0/0 0/0  Ankle dorsiflexion         Ankle plantarflexion         Ankle inversion         Ankle eversion          (Blank rows = not tested)   LOWER EXTREMITY STRENGTH:   Strength in pounds assessed by hand-held dynamometer Right 05/18/22 Left 05/18/22 Left/Right in pounds 06/19/22 Left/Rt 07/07/21  Hip flexion        Hip extension        Hip abduction        Hip adduction        Hip internal rotation        Hip external rotation        Knee flexion        Knee extension 19.2 17.3 32.7/43.2 35.8/38.8  Ankle dorsiflexion        Ankle plantarflexion         Ankle inversion        Ankle eversion         (Blank rows = not tested)   GAIT: Distance walked: In the clinic Assistive device utilized: None (was Quad cane small base) Level of assistance: Complete Independence Comments: Rarely uses the cane (previously used the cane almost of the time)     TODAY'S TREATMENT:  07/14/22:  TherEx:  Scifit: Level 3 x 6 minutes Leg Press: bil LE's 81# x 25 Leg Press: single LE  56#  2 x 10  Slant board: x 1 minute Step ups on 8 inch step c single UE support x 20 Neuromuscular Re-edu:  Side stepping x 20 feet x 4 Tandum stance: c CGA to Close supervision x 3 with each LE leading  Bil calf raise, then lifting one leg off ground and lowering with single leg x 5 each LE c UE support   07/09/2022 Bridges 2 sets of 10 5 seconds Quadriceps sets 10X 5 seconds Seated straight leg raises 4 sets of 5 for 3 seconds with 1# weight   Functional Activities: Double Leg Press 50# 25X slow eccentrics Single Leg Press 25# Bil 15X slow eccentrics Step-down off 4 inch step 5X slow eccentrics Bil Step-up and over 6 & 8  inch step 10X slow eccentrics   07/07/22:  TherEx:  Scifit: Level 3 x 8 minutes Leg Press: bil LE's 62# x 20 Leg Press: single LE  31#  2 x 10  Slant board: x 1 minute Sit to stand: x 10 from mat at lowest level  Lateral step ups on 6 inch step x 20   Measurements taken again this visit (see chart above).   06/30/2021 Bridges 10X 5 seconds Quadriceps sets 10X 5 seconds Seated straight leg raises 4 sets of 5 for 3 seconds with 1# weight   Functional Activities: Double Leg Press 50# 25X slow eccentrics Single Leg Press 25# Bil 15X slow eccentrics Step-down off 6 inch step 10X slow eccentrics Step-up and over 6 inch step 10X slow eccentrics    PATIENT EDUCATION:  Education details: exercise form and purpose, walking program or even consider walking in the pool to take stress off of knees   Person educated: Patient Education method:  Explanation : verbalized understanding, returned demonstration, verbal cues required, tactile cues required, and needs further education   HOME EXERCISE PROGRAM: Access Code: [email or web address] URL: [email or web address] Date: 06/19/2022 Prepared by: Vista Mink  Exercises - Supine Quadricep Sets  - 3-5 x daily - 7 x weekly - 2-3 sets - 10 reps - 5 second hold - Seated Knee Flexion AAROM  - 3 x daily - 3 x weekly - 1 sets - 1 reps - 3 minutes hold - Small Range Straight Leg Raise  - 3-5 x daily - 3 x weekly - 2 sets - 5 reps - 3 seconds hold - Yoga Bridge  - 1 x daily - 7 x weekly - 2-3 sets - 10 reps - 5 seconds hold    ASSESSMENT:   CLINICAL IMPRESSION: Pt arriving 10 minutes late to her visit today. Pt reporting no pain upon arrival. Pt did however state she experienced pain over the weekend in her left knee. Pt stating pain was circumferential around entire knee joint. Pt tolerating all exercises well focusing on left LE strengthening and dynamic activities. Pt will need PN prior to next MD visit on 07/29/22. Continue skilled PT to maximize pt's function.     OBJECTIVE IMPAIRMENTS: Abnormal gait, decreased activity tolerance, decreased endurance, decreased knowledge of condition, difficulty walking, decreased ROM, decreased strength, increased edema, and pain.    ACTIVITY LIMITATIONS: carrying, lifting, bending, sitting, standing, squatting, stairs, bed mobility, and locomotion level   PARTICIPATION LIMITATIONS: driving, shopping, and community activity   PERSONAL FACTORS: Type 2 DM, HLD, HTN, osteoporosis are also affecting patient's functional outcome.    REHAB POTENTIAL: Good   CLINICAL DECISION MAKING: Stable/uncomplicated   EVALUATION COMPLEXITY: Low     GOALS: Goals reviewed with patient? Yes   SHORT TERM GOALS: Target date: 07/13/2022 Rut will improve her bilateral quadriceps strength to at least 30 pounds Baseline: Less than 20 pounds Goal status: MET  06/19/22   2.  Hilaria will improve her left knee flexion active range of motion to at least 110 degrees Baseline: 90 degrees Goal status: MET 05/26/22   3.  Haroldine will demonstrate independence with her day 1 home exercise program Baseline: Started 1120/2023 Goal status: MET 05/26/22       LONG TERM GOALS: Target date: 07/13/2021   Improve Foto to 63 Baseline: 59 Goal status: MET 06/19/2022   2.  Tata will report bilateral knee pain consistently 0-4 out of 10 on the visual analog scale (VAS) Baseline: 3-7 out of 10 Goal status: On  Going 07/09/2022   3.  Improve bilateral quadriceps strength to at least 50 pounds bilaterally Baseline: Less than 20 pounds Goal status: On Going 07/09/2022   4.  Improve bilateral knee flexion active range of motion to 125 degrees and extension to 0 degrees Baseline: 90/124 and 0/-2 Goal status: Met 06/30/2022   5.  Elverda will be independent with her long-term home exercise program at discharge Baseline: Started 05/18/2022 Goal status: On Going 07/09/2022   PLAN:   PT FREQUENCY: 1-2x/week   PT DURATION: 8 weeks   PLANNED INTERVENTIONS: Therapeutic exercises, Therapeutic activity, Neuromuscular re-education, Balance training, Gait training, Patient/Family education, Self Care, Stair training, Cryotherapy, and Vasopneumatic device   PLAN FOR NEXT SESSION: Progress quadriceps strength, implement balance and functional activities as appropriate. PN before MD visit on 07/29/22   Kearney Hard, PT, MPT 07/14/22 8:55 AM

## 2022-07-16 ENCOUNTER — Ambulatory Visit (INDEPENDENT_AMBULATORY_CARE_PROVIDER_SITE_OTHER): Payer: Medicare Other | Admitting: Rehabilitative and Restorative Service Providers"

## 2022-07-16 ENCOUNTER — Encounter: Payer: Self-pay | Admitting: Rehabilitative and Restorative Service Providers"

## 2022-07-16 DIAGNOSIS — M25662 Stiffness of left knee, not elsewhere classified: Secondary | ICD-10-CM

## 2022-07-16 DIAGNOSIS — M25561 Pain in right knee: Secondary | ICD-10-CM

## 2022-07-16 DIAGNOSIS — M25562 Pain in left knee: Secondary | ICD-10-CM

## 2022-07-16 DIAGNOSIS — M25661 Stiffness of right knee, not elsewhere classified: Secondary | ICD-10-CM

## 2022-07-16 DIAGNOSIS — R600 Localized edema: Secondary | ICD-10-CM

## 2022-07-16 DIAGNOSIS — R262 Difficulty in walking, not elsewhere classified: Secondary | ICD-10-CM

## 2022-07-16 NOTE — Therapy (Signed)
OUTPATIENT PHYSICAL THERAPY TREATMENT/PROGRESS NOTE   Patient Name: Amy Escobar MRN: [id number] DOB:1945/09/14, 77 y.o., female Today's Date: 07/16/2022  PCP: Amy Salvage, FNP  REFERRING PROVIDER: Mcarthur Rossetti, MD   END OF SESSION:   PT End of Session - 07/16/22 0810     Visit Number 14    Number of Visits 16    Progress Note Due on Visit 3664    PT Start Time 0804    PT Stop Time 4034    PT Time Calculation (min) 45 min    Activity Tolerance Patient tolerated treatment well;No increased pain    Behavior During Therapy [facility] for tasks assessed/performed            Progress Note Reporting Period 05/18/2022 to 07/16/2022  See note below for Objective Data and Assessment of Progress/Goals.      Past Medical History:  Diagnosis Date   Alkaline phosphatase deficiency    w/u Ne   Allergic rhinitis    Allergy    Anxiety    Arthritis    Cataract    BILATERAL-REMOVED   DM2 (diabetes mellitus, type 2) (HCC)    GERD (gastroesophageal reflux disease)    Gout    Hemorrhoids    Hyperlipidemia    Hypertension    Mild pulmonary hypertension (HCC)    NSVT (nonsustained ventricular tachycardia) (HCC)    Osteoporosis    PVC's (premature ventricular contractions)    Thyroid nodule    small   Tubular adenoma of colon 2017   Urticaria    Uterine fibroid    Past Surgical History:  Procedure Laterality Date   CATARACT EXTRACTION Bilateral 2016   COLONOSCOPY  2007   echocardiogram (other)  01/16/2002   POLYPECTOMY     removed tumors from foot nerves  04/1999   stress cardiolite  02/12/2006   TOENAIL EXCISION     TUBAL LIGATION     TYMPANOSTOMY TUBE PLACEMENT     Patient Active Problem List   Diagnosis Date Noted   Type 2 diabetes mellitus with diabetic polyneuropathy, without long-term current use of insulin ([place]) 06/18/2021   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 without macular edema, without  long-term current use of insulin ([place]) 06/18/2021   Diabetes mellitus ([place]) 06/18/2021   Osteoarthritis of right AC (acromioclavicular) joint 03/06/2021   Osteoarthritis of right glenohumeral joint 03/06/2021   Foot pain, bilateral 12/26/2020   Diabetic neuropathy ([place]) 01/10/2020   Hav (hallux abducto valgus), unspecified laterality 01/10/2020   Chronic arthropathy 01/10/2020   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 edema associated with type 2 diabetes mellitus ([place]) 12/26/2019   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   AC (acromioclavicular) arthritis 08/02/2019   Unspecified inflammatory spondylopathy, 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[place]) 04/19/2019   Claudication ([place]) 04/19/2019   Morbid obesity ([place]) 04/19/2019   Suspected COVID-19 virus infection 04/13/2019   Upper airway cough syndrome 08/22/2018   Greater trochanteric bursitis of both hips 06/15/2018   Trigger point of shoulder region, left 02/07/2018   Hypertension    Mild pulmonary hypertension (HCC)    NSVT (nonsustained ventricular tachycardia) (HCC)    PVC's (premature ventricular contractions)    URI (upper respiratory infection) 08/09/2016   Neck pain 02/10/2016   Urinary incontinence 02/10/2016   Degenerative arthritis of knee, bilateral 07/17/2015   Knee MCL sprain 06/07/2015   Discomfort in chest 02/15/2015   Chest pain 02/15/2015   DM type 2, controlled, with  complication ([place])    Wellness examination 08/06/2014   Acute meniscal tear of knee 02/13/2014   Primary localized osteoarthrosis, lower leg 02/13/2014   Gastrocnemius tear 12/25/2013   UTI (urinary tract infection) 12/20/2013   Right knee pain 12/20/2013   Pulmonary hypertension ([place]) 10/06/2013   DOE (dyspnea on exertion) 08/04/2013   Dysphagia, unspecified(787.20) 08/10/2012   Hip pain, left 02/02/2012   Painful respiration 05/25/2011   Encounter for long-term (current) use of other medications 01/30/2011   PELVIC  PAIN, LEFT 07/30/2010   TOBACCO USE, QUIT 07/07/2010   FATIGUE 12/20/2009   Headache 12/20/2009   Low back pain 12/26/2008   Disorder of liver 04/13/2008   FOOT PAIN, BILATERAL 04/13/2008   FIBROIDS, UTERUS 11/24/2007   THYROID NODULE, LEFT 11/24/2007   HYPERCHOLESTEROLEMIA 11/24/2007   CARPAL TUNNEL SYNDROME, BILATERAL 11/24/2007   ALKALINE PHOSPHATASE, ELEVATED 11/24/2007   Gout 08/10/2007   ANXIETY 08/10/2007   Essential hypertension 08/10/2007   Cough 08/01/2007   Perennial and seasonal allergic rhinitis 01/14/2007   GERD 01/14/2007   Osteoporosis 01/14/2007    REFERRING DIAG:  M25.562,G89.29 (ICD-10-CM) - Chronic pain of left knee  M25.561,G89.29 (ICD-10-CM) - Chronic pain of right knee  S83.232D (ICD-10-CM) - Complex tear of medial meniscus of left knee as current injury, subsequent encounter    THERAPY DIAG:  Difficulty in walking, not elsewhere classified  Localized edema  Stiffness of left knee, not elsewhere classified  Stiffness of right knee, not elsewhere classified  Pain in both knees, unspecified chronicity  Rationale for Evaluation and Treatment Rehabilitation  PERTINENT HISTORY: Type 2 DM, HLD, HTN, osteoporosis   PRECAUTIONS: none  SUBJECTIVE:                                                                                                                                                                                      SUBJECTIVE STATEMENT:   Amy Escobar has the most difficulty with cold and/or rainy weather.  Prolonged postures are also difficult.  PAIN:  Are you having pain? 3-4/10 in her left > right knee.  Description: Ache Location: Bil knees Aggravating factors: Prolonged postures, rain, cold weather Relieving factors: Ice, exercise, movement    OBJECTIVE: (objective measures completed at initial evaluation unless otherwise dated) DIAGNOSTIC FINDINGS:    Left knee IMPRESSION: 1. Root tear of the posterior horn of the medial meniscus  with resulting mild peripheral extrusion of the meniscus from the joint space. 2. Mild medial and patellofemoral compartment degenerative chondrosis. No acute osseous findings. 3. Intact lateral meniscus, cruciate and collateral ligaments. 4. Heterogeneous, serpiginous T2 hyperintensity centrally and laterally in Hoffa's fat may reflect a venous malformation or atypical ganglion.  Right knee IMPRESSION: 1. Chronic root tear of the posterior horn of the medial meniscus with progressive peripheral extrusion of the meniscus from the joint space. 2. Progressive medial compartment osteoarthritis. No acute osseous findings. 3. The lateral meniscus, cruciate and collateral ligaments are intact. 4. Small joint effusion and small Baker's cyst.   PATIENT SURVEYS:  06/19/2022 FOTO 78 (Goal met)  05/18/2022 FOTO 59 (Goal 63 in 12 visits)   COGNITION: Overall cognitive status: Within functional limits for tasks assessed                         SENSATION: No complaints of peripheral paresthesias, new tingling or numbness   EDEMA:  Noted and not objectively assessed for bilateral knees   LOWER EXTREMITY ROM:   Active ROM Right eval Left eval Rt/Left 05/26/22 Rt/left 06/03/22 Rt/Lt 06/30/2021 Left/Right 07/16/2022  Hip flexion          Hip extension          Hip abduction          Hip adduction          Hip internal rotation          Hip external rotation          Knee flexion 124 90 122/110 122/114 134/132 136/134  Knee extension -2 0 0/0 0/0 0/0 0/0  Ankle dorsiflexion          Ankle plantarflexion          Ankle inversion          Ankle eversion           (Blank rows = not tested)   LOWER EXTREMITY STRENGTH:   Strength in pounds assessed by hand-held dynamometer Right 05/18/22 Left 05/18/22 Left/Right in pounds 06/19/22 Left/Rt 07/07/21 Left/Right 07/16/2022  Hip flexion         Hip extension         Hip abduction         Hip adduction         Hip internal rotation          Hip external rotation         Knee flexion         Knee extension 19.2 17.3 32.7/43.2 35.8/38.8 30.5/36.5  Ankle dorsiflexion         Ankle plantarflexion         Ankle inversion         Ankle eversion          (Blank rows = not tested)   GAIT: Distance walked: In the clinic Assistive device utilized: None (was Quad cane small base) Level of assistance: Complete Independence Comments: Rarely uses the cane (previously used the cane almost of the time)     TODAY'S TREATMENT:  07/16/2022 Bridges 2 sets of 10 5 seconds Quadriceps sets 10X 5 seconds Seated straight leg raises 4 sets of 5 for 3 seconds with 1# weight   Functional Activities: Double Leg Press 50# 25X slow eccentrics Single Leg Press 25# Bil 15X slow eccentrics Step-down off 4 inch step 5X slow eccentrics Bil Step-up and over 6 & 8 inch step 10X slow eccentrics   07/14/22:  TherEx:  Scifit: Level 3 x 6 minutes Leg Press: bil LE's 81# x 25 Leg Press: single LE  56#  2 x 10  Slant board: x 1 minute Step ups on 8 inch step c single UE support x 20 Neuromuscular Re-edu:  Side stepping x 20 feet x 4 Tandum stance: c CGA to Close supervision x 3 with each LE leading  Bil calf raise, then lifting one leg off ground and lowering with single leg x 5 each LE c UE support   07/09/2022 Bridges 2 sets of 10 5 seconds Quadriceps sets 10X 5 seconds Seated straight leg raises 4 sets of 5 for 3 seconds with 1# weight   Functional Activities: Double Leg Press 50# 25X slow eccentrics Single Leg Press 25# Bil 15X slow eccentrics Step-down off 4 inch step 5X slow eccentrics Bil Step-up and over 6 & 8 inch step 10X slow eccentrics   PATIENT EDUCATION:  Education details: exercise form and purpose, walking program or even consider walking in the pool to take stress off of knees   Person educated: Patient Education method: Explanation : verbalized understanding, returned demonstration, verbal cues required, tactile cues  required, and needs further education   HOME EXERCISE PROGRAM: Access Code: [email or web address] URL: [email or web address] Date: 07/16/2022 Prepared by: Vista Mink  Exercises - Supine Quadricep Sets  - 3-5 x daily - 3-4 x weekly - 2-3 sets - 10 reps - 5 second hold - Seated Knee Flexion AAROM  - 3 x daily - 1 x weekly - 1 sets - 1 reps - 3 minutes hold - Small Range Straight Leg Raise  - 3-5 x daily - 3-4 x weekly - 2 sets - 5 reps - 3 seconds hold - Yoga Bridge  - 1 x daily - 3-4 x weekly - 2-3 sets - 10 reps - 5 seconds hold - Lateral Step Down  - 1 x daily - 3-4 x weekly - 3 sets - 10 reps   ASSESSMENT:   CLINICAL IMPRESSION: Elese has made great progress with her bilateral knee AROM and self-reported function.  Both AROM and functional goals have been met.  Pain and strength, although improved, will benefit from continued long-term compliance with her updated HEP.  Cherylyn asked to have availability to her last 2 visits (she missed 2 appointments) and she was given the option to attend them if it helps with her long-term understanding and compliance with her home exercises.  She will DC into independent PT between today and another 2 visits (if requested).    OBJECTIVE IMPAIRMENTS: Abnormal gait, decreased activity tolerance, decreased endurance, decreased knowledge of condition, difficulty walking, decreased ROM, decreased strength, increased edema, and pain.    ACTIVITY LIMITATIONS: carrying, lifting, bending, sitting, standing, squatting, stairs, bed mobility, and locomotion level   PARTICIPATION LIMITATIONS: driving, shopping, and community activity   PERSONAL FACTORS: Type 2 DM, HLD, HTN, osteoporosis are also affecting patient's functional outcome.    REHAB POTENTIAL: Good   CLINICAL DECISION MAKING: Stable/uncomplicated   EVALUATION COMPLEXITY: Low     GOALS: Goals reviewed with patient? Yes   SHORT TERM GOALS: Target date: 07/13/2022 Crysta will improve her bilateral  quadriceps strength to at least 30 pounds Baseline: Less than 20 pounds Goal status: MET 06/19/22   2.  Demiyah will improve her left knee flexion active range of motion to at least 110 degrees Baseline: 90 degrees Goal status: MET 05/26/22   3.  Kei will demonstrate independence with her day 1 home exercise program Baseline: Started 1120/2023 Goal status: MET 05/26/22       LONG TERM GOALS: Target date: 08/13/2021   Improve Foto to 63 Baseline: 59 Goal status: MET 06/19/2022   2.  Sonali will report bilateral knee pain consistently 0-4 out of  10 on the visual analog scale (VAS) Baseline: 3-7 out of 10 Goal status: On Going 07/16/2022   3.  Improve bilateral quadriceps strength to at least 50 pounds bilaterally Baseline: Less than 20 pounds Goal status: On Going 07/16/2022   4.  Improve bilateral knee flexion active range of motion to 125 degrees and extension to 0 degrees Baseline: 90/124 and 0/-2 Goal status: Met 06/30/2022   5.  Lilyahna will be independent with her long-term home exercise program at discharge Baseline: Started 05/18/2022 Goal status: On Going 07/16/2022   PLAN:   PT FREQUENCY: 1-2 visits   PT DURATION: 1-2 visits   PLANNED INTERVENTIONS: Therapeutic exercises, Therapeutic activity, Neuromuscular re-education, Balance training, Gait training, Patient/Family education, Self Care, Stair training, Cryotherapy, and Vasopneumatic device   PLAN FOR NEXT SESSION: Continue to transition Kynadee into independent rehabilitation with DC in 1-2 visits.   Farley Ly, PT, MPT 07/16/22 12:09 PM

## 2022-07-17 ENCOUNTER — Other Ambulatory Visit: Payer: Self-pay | Admitting: Internal Medicine

## 2022-07-29 ENCOUNTER — Other Ambulatory Visit: Payer: Self-pay

## 2022-07-29 ENCOUNTER — Telehealth: Payer: 59

## 2022-07-29 ENCOUNTER — Ambulatory Visit (INDEPENDENT_AMBULATORY_CARE_PROVIDER_SITE_OTHER): Payer: 59 | Admitting: Orthopaedic Surgery

## 2022-07-29 ENCOUNTER — Encounter: Payer: Self-pay | Admitting: Orthopaedic Surgery

## 2022-07-29 DIAGNOSIS — M25561 Pain in right knee: Secondary | ICD-10-CM

## 2022-07-29 DIAGNOSIS — M7051 Other bursitis of knee, right knee: Secondary | ICD-10-CM | POA: Diagnosis not present

## 2022-07-29 DIAGNOSIS — M7052 Other bursitis of knee, left knee: Secondary | ICD-10-CM | POA: Diagnosis not present

## 2022-07-29 DIAGNOSIS — G8929 Other chronic pain: Secondary | ICD-10-CM | POA: Diagnosis not present

## 2022-07-29 DIAGNOSIS — M25562 Pain in left knee: Secondary | ICD-10-CM

## 2022-07-29 NOTE — Progress Notes (Signed)
The patient comes in with continued bilateral knee pain that has gotten some better with injections and especially physical therapy.  At her last visit injections were placed in both pes bursa.  She is 77 years old and does ambulate the cane.  She would like to continue physical therapy upstairs.  In the remote past she has had hyaluronic acid for her knees to treat the pain from osteoarthritis.  She would like to try that again as well.  She is otherwise had no acute change in medical status.  We have been seeing her for a while now with continuing to try conservative treatment for both knees.  Both knees were examined and are stiff and show arthritic changes with pain in all 3 compartments.  There is also pes bursa pain in both knees.  I agree with ordering hyaluronic acid again for both knees to treat the pain from osteoarthritis since that has worked best in the past.  She will continue outpatient physical therapy as well.  We will see her back in follow-up once we have approval in order for those injections.  All questions and concerns were addressed and answered.

## 2022-07-29 NOTE — Telephone Encounter (Signed)
Bilateral gel injections  

## 2022-07-29 NOTE — Telephone Encounter (Signed)
VOB submitted for Durolane, bilateral knee.  

## 2022-07-31 DIAGNOSIS — R339 Retention of urine, unspecified: Secondary | ICD-10-CM | POA: Diagnosis not present

## 2022-07-31 LAB — POCT GLYCOSYLATED HEMOGLOBIN (HGB A1C): Hemoglobin-A1c: 5.7

## 2022-07-31 LAB — GLUCOSE, POCT (MANUAL RESULT ENTRY): POC Glucose: 102 mg/dl — AB (ref 70–99)

## 2022-08-06 ENCOUNTER — Ambulatory Visit (INDEPENDENT_AMBULATORY_CARE_PROVIDER_SITE_OTHER): Payer: 59 | Admitting: Rehabilitative and Restorative Service Providers"

## 2022-08-06 ENCOUNTER — Other Ambulatory Visit: Payer: Self-pay

## 2022-08-06 ENCOUNTER — Encounter: Payer: Self-pay | Admitting: Rehabilitative and Restorative Service Providers"

## 2022-08-06 DIAGNOSIS — M25661 Stiffness of right knee, not elsewhere classified: Secondary | ICD-10-CM

## 2022-08-06 DIAGNOSIS — M25562 Pain in left knee: Secondary | ICD-10-CM | POA: Diagnosis not present

## 2022-08-06 DIAGNOSIS — G8929 Other chronic pain: Secondary | ICD-10-CM

## 2022-08-06 DIAGNOSIS — M25662 Stiffness of left knee, not elsewhere classified: Secondary | ICD-10-CM | POA: Diagnosis not present

## 2022-08-06 DIAGNOSIS — R600 Localized edema: Secondary | ICD-10-CM | POA: Diagnosis not present

## 2022-08-06 DIAGNOSIS — R262 Difficulty in walking, not elsewhere classified: Secondary | ICD-10-CM | POA: Diagnosis not present

## 2022-08-06 DIAGNOSIS — M25561 Pain in right knee: Secondary | ICD-10-CM | POA: Diagnosis not present

## 2022-08-06 NOTE — Therapy (Signed)
OUTPATIENT PHYSICAL THERAPY TREATMENT/PROGRESS NOTE   Patient Name: Amy Escobar MRN: [id number] DOB:[birth year], 77 y.o., female Today's Date: 08/06/2022  PCP: [name], FNP  REFERRING PROVIDER: Mcarthur Rossetti, MD   Progress Note Reporting Period 05/18/2022 to 08/06/2022  See note below for Objective Data and Assessment of Progress/Goals.     END OF SESSION:   PT End of Session - 08/06/22 0803     Visit Number 15    Number of Visits 16    Progress Note Due on Visit 1610    PT Start Time 0802    PT Stop Time 0843    PT Time Calculation (min) 41 min    Activity Tolerance Patient tolerated treatment well;No increased pain;Patient limited by fatigue;Patient limited by pain    Behavior During Therapy [facility] for tasks assessed/performed             Past Medical History:  Diagnosis Date   Alkaline phosphatase deficiency    w/u Ne   Allergic rhinitis    Allergy    Anxiety    Arthritis    Cataract    BILATERAL-REMOVED   DM2 (diabetes mellitus, type 2) ([place])    GERD (gastroesophageal reflux disease)    Gout    Hemorrhoids    Hyperlipidemia    Hypertension    Mild pulmonary hypertension (HCC)    NSVT (nonsustained ventricular tachycardia) (HCC)    Osteoporosis    PVC's (premature ventricular contractions)    Thyroid nodule    small   Tubular adenoma of colon 2017   Urticaria    Uterine fibroid    Past Surgical History:  Procedure Laterality Date   CATARACT EXTRACTION Bilateral 2016   COLONOSCOPY  2007   echocardiogram (other)  01/16/2002   POLYPECTOMY     removed tumors from foot nerves  04/1999   stress cardiolite  02/12/2006   TOENAIL EXCISION     TUBAL LIGATION     TYMPANOSTOMY TUBE PLACEMENT     Patient Active Problem List   Diagnosis Date Noted   Type 2 diabetes mellitus with diabetic polyneuropathy, without long-term current use of insulin ([place]) 06/18/2021   Type 2 diabetes mellitus with both eyes affected by moderate  nonproliferative retinopathy without macular edema, without long-term current use of insulin ([place]) 06/18/2021   Diabetes mellitus ([place]) 06/18/2021   Osteoarthritis of right AC (acromioclavicular) joint 03/06/2021   Osteoarthritis of right glenohumeral joint 03/06/2021   Foot pain, bilateral 12/26/2020   Diabetic neuropathy ([place]) 01/10/2020   Hav (hallux abducto valgus), unspecified laterality 01/10/2020   Chronic arthropathy 01/10/2020   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 edema associated with type 2 diabetes mellitus ([place]) 12/26/2019   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   AC (acromioclavicular) arthritis 08/02/2019   Unspecified inflammatory spondylopathy, 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[place]) 04/19/2019   Claudication ([place]) 04/19/2019   Morbid obesity ([place]) 04/19/2019   Suspected COVID-19 virus infection 04/13/2019   Upper airway cough syndrome 08/22/2018   Greater trochanteric bursitis of both hips 06/15/2018   Trigger point of shoulder region, left 02/07/2018   Hypertension    Mild pulmonary hypertension (HCC)    NSVT (nonsustained ventricular tachycardia) (HCC)    PVC's (premature ventricular contractions)    URI (upper respiratory infection) 08/09/2016   Neck pain 02/10/2016   Urinary incontinence 02/10/2016   Degenerative arthritis of knee, bilateral 07/17/2015   Knee MCL sprain 06/07/2015   Discomfort in chest 02/15/2015   Chest pain 02/15/2015  DM type 2, controlled, with complication ([employer])    Wellness examination 08/06/2014   Acute meniscal tear of knee 02/13/2014   Primary localized osteoarthrosis, lower leg 02/13/2014   Gastrocnemius tear 12/25/2013   UTI (urinary tract infection) 12/20/2013   Right knee pain 12/20/2013   Pulmonary hypertension ([place]) 10/06/2013   DOE (dyspnea on exertion) 08/04/2013   Dysphagia, unspecified(787.20) 08/10/2012   Hip pain, left 02/02/2012   Painful respiration 05/25/2011   Encounter for  long-term (current) use of other medications 01/30/2011   PELVIC PAIN, LEFT 07/30/2010   TOBACCO USE, QUIT 07/07/2010   FATIGUE 12/20/2009   Headache 12/20/2009   Low back pain 12/26/2008   Disorder of liver 04/13/2008   FOOT PAIN, BILATERAL 04/13/2008   FIBROIDS, UTERUS 11/24/2007   THYROID NODULE, LEFT 11/24/2007   HYPERCHOLESTEROLEMIA 11/24/2007   CARPAL TUNNEL SYNDROME, BILATERAL 11/24/2007   ALKALINE PHOSPHATASE, ELEVATED 11/24/2007   Gout 08/10/2007   ANXIETY 08/10/2007   Essential hypertension 08/10/2007   Cough 08/01/2007   Perennial and seasonal allergic rhinitis 01/14/2007   GERD 01/14/2007   Osteoporosis 01/14/2007    REFERRING DIAG:  M25.562,G89.29 (ICD-10-CM) - Chronic pain of left knee  M25.561,G89.29 (ICD-10-CM) - Chronic pain of right knee  S83.232D (ICD-10-CM) - Complex tear of medial meniscus of left knee as current injury, subsequent encounter    THERAPY DIAG:  Difficulty in walking, not elsewhere classified  Localized edema  Stiffness of left knee, not elsewhere classified  Stiffness of right knee, not elsewhere classified  Pain in both knees, unspecified chronicity  Rationale for Evaluation and Treatment Rehabilitation  PERTINENT HISTORY: Type 2 DM, HLD, HTN, osteoporosis   PRECAUTIONS: none  SUBJECTIVE:                                                                                                                                                                                      SUBJECTIVE STATEMENT:   Amy Escobar says she saw Dr. Ninfa Linden last week.  They are trying to get gel shots authorized and she wants to avoid surgery and continue supervised PT.  We discussed the need to transfer into independent PT for long-term management and settled on another 4 weeks of supervised PT with a progress note at that time.    PAIN:  Are you having pain? 3-6/10 in her left knee and 3-4/10 right knee this week.  Description: Ache Location: Bil  knees Aggravating factors: Prolonged postures, rain, cold weather Relieving factors: Ice, exercise, movement    OBJECTIVE: (objective measures completed at initial evaluation unless otherwise dated) DIAGNOSTIC FINDINGS:    Left knee IMPRESSION: 1. Root tear of the posterior horn of the medial meniscus with  resulting mild peripheral extrusion of the meniscus from the joint space. 2. Mild medial and patellofemoral compartment degenerative chondrosis. No acute osseous findings. 3. Intact lateral meniscus, cruciate and collateral ligaments. 4. Heterogeneous, serpiginous T2 hyperintensity centrally and laterally in Hoffa's fat may reflect a venous malformation or atypical ganglion.   Right knee IMPRESSION: 1. Chronic root tear of the posterior horn of the medial meniscus with progressive peripheral extrusion of the meniscus from the joint space. 2. Progressive medial compartment osteoarthritis. No acute osseous findings. 3. The lateral meniscus, cruciate and collateral ligaments are intact. 4. Small joint effusion and small Baker's cyst.   PATIENT SURVEYS:  06/19/2022 FOTO 78 (Goal met)  05/18/2022 FOTO 59 (Goal 63 in 12 visits)   COGNITION: Overall cognitive status: Within functional limits for tasks assessed                         SENSATION: No complaints of peripheral paresthesias, new tingling or numbness   EDEMA:  Noted and not objectively assessed for bilateral knees   LOWER EXTREMITY ROM:   Active ROM Right eval Left eval Rt/Left 05/26/22 Rt/left 06/03/22 Rt/Lt 06/30/2021 Left/Right 07/16/2022  Hip flexion          Hip extension          Hip abduction          Hip adduction          Hip internal rotation          Hip external rotation          Knee flexion 124 90 122/110 122/114 134/132 136/134  Knee extension -2 0 0/0 0/0 0/0 0/0  Ankle dorsiflexion          Ankle plantarflexion          Ankle inversion          Ankle eversion           (Blank rows =  not tested)   LOWER EXTREMITY STRENGTH:   Strength in pounds assessed by hand-held dynamometer Right 05/18/22 Left 05/18/22 Left/Right in pounds 06/19/22 Left/Rt 07/07/21 Left/Right 07/16/2022 Left/Right 08/06/2022  Hip flexion          Hip extension          Hip abduction          Hip adduction          Hip internal rotation          Hip external rotation          Knee flexion          Knee extension 19.2 17.3 32.7/43.2 35.8/38.8 30.5/36.5 27.1/31.9  Ankle dorsiflexion          Ankle plantarflexion          Ankle inversion          Ankle eversion           (Blank rows = not tested)   GAIT: Distance walked: In the clinic Assistive device utilized: None (was Quad cane small base) Level of assistance: Complete Independence Comments: Rarely uses the cane (previously used the cane almost of the time)     TODAY'S TREATMENT:  08/06/2022 [place] 2 sets of 10 5 seconds Quadriceps sets 10X 5 seconds Seated straight leg raises 3 sets of 5 for 3 seconds with 1# weight   Functional Activities: Double Leg Press 50# 15X slow eccentrics Single Leg Press 25# Bil 10X slow eccentrics   07/16/2022 Constance Haw  2 sets of 10 5 seconds Quadriceps sets 10X 5 seconds Seated straight leg raises 4 sets of 5 for 3 seconds with 1# weight   Functional Activities: Double Leg Press 50# 25X slow eccentrics Single Leg Press 25# Bil 15X slow eccentrics Step-down off 4 inch step 5X slow eccentrics Bil Step-up and over 6 & 8 inch step 10X slow eccentrics   07/14/22:  TherEx:  Scifit: Level 3 x 6 minutes Leg Press: bil LE's 81# x 25 Leg Press: single LE  56#  2 x 10  Slant board: x 1 minute Step ups on 8 inch step c single UE support x 20 Neuromuscular Re-edu:  Side stepping x 20 feet x 4 Tandum stance: c CGA to Close supervision x 3 with each LE leading  Bil calf raise, then lifting one leg off ground and lowering with single leg x 5 each LE c UE support   PATIENT EDUCATION:  Education details:  exercise form and purpose, walking program or even consider walking in the pool to take stress off of knees   Person educated: Patient Education method: Explanation : verbalized understanding, returned demonstration, verbal cues required, tactile cues required, and needs further education   HOME EXERCISE PROGRAM: Access Code: [email or web address] URL: [email or web address] Date: 08/06/2022 Prepared by: Vista Mink  Exercises - Supine Quadricep Sets  - 3-5 x daily - 4-5 x weekly - 2-3 sets - 10 reps - 5 second hold - Seated Knee Flexion AAROM  - 3 x daily - 1 x weekly - 1 sets - 1 reps - 3 minutes hold - Small Range Straight Leg Raise  - 3-5 x daily - 4-5 x weekly - 2 sets - 5 reps - 3 seconds hold - Yoga Bridge  - 1 x daily - 4-5 x weekly - 2-3 sets - 10 reps - 5 seconds hold - Lateral Step Down  - 1 x daily - 3 x weekly - 2-3 sets - 10 reps   ASSESSMENT:   CLINICAL IMPRESSION: Sydna reports "fair" HEP compliance since her last PT visit 3 weeks ago.  She is awaiting authorization for knee injections and has requested another month of supervised PT as she very much wants to avoid any surgeries.  We discussed the importance of transfer into independent exercise long-term and settled on another 4 weeks of supervised PT to get quadriceps strength back up to acceptable levels (slight decrease in quadriceps strength while independent over the past 3 weeks) and make sure she is comfortable with her long-term HEP (some correction was required today).  I anticipate transfer into independent PT at the end of the 4 weeks.    OBJECTIVE IMPAIRMENTS: Abnormal gait, decreased activity tolerance, decreased endurance, decreased knowledge of condition, difficulty walking, decreased ROM, decreased strength, increased edema, and pain.    ACTIVITY LIMITATIONS: carrying, lifting, bending, sitting, standing, squatting, stairs, bed mobility, and locomotion level   PARTICIPATION LIMITATIONS: driving, shopping,  and community activity   PERSONAL FACTORS: Type 2 DM, HLD, HTN, osteoporosis are also affecting patient's functional outcome.    REHAB POTENTIAL: Good   CLINICAL DECISION MAKING: Stable/uncomplicated   EVALUATION COMPLEXITY: Low     GOALS: Goals reviewed with patient? Yes   SHORT TERM GOALS: Target date: 07/13/2022  Amy Escobar will improve her bilateral quadriceps strength to at least 30 pounds Baseline: Less than 20 pounds Goal status: MET 06/19/22   2.  Amy Escobar will improve her left knee flexion active range of motion to at least 110 degrees Baseline: 90  degrees Goal status: MET 05/26/22   3.  Amy Escobar will demonstrate independence with her day 1 home exercise program Baseline: Started 1120/2023 Goal status: MET 05/26/22       LONG TERM GOALS: Target date: 09/02/2021   Improve Foto to 63 Baseline: 59 Goal status: MET 06/19/2022   2.  Amy Escobar will report bilateral knee pain consistently 0-4 out of 10 on the visual analog scale (VAS) Baseline: 3-7 out of 10 Goal status: On Going 08/06/2022   3.  Improve bilateral quadriceps strength to at least 50 pounds bilaterally Baseline: Less than 20 pounds Goal status: On Going 08/06/2022   4.  Improve bilateral knee flexion active range of motion to 125 degrees and extension to 0 degrees Baseline: 90/124 and 0/-2 Goal status: Met 06/30/2022   5.  Amy Escobar will be independent with her long-term home exercise program at discharge Baseline: Started 05/18/2022 Goal status: On Going 08/06/2022   PLAN:   PT FREQUENCY: 1-2 X/week   PT DURATION: 4 weeks then DC to independent PT   PLANNED INTERVENTIONS: Therapeutic exercises, Therapeutic activity, Neuromuscular re-education, Balance training, Gait training, Patient/Family education, Self Care, Stair training, Cryotherapy, and Vasopneumatic device   PLAN FOR NEXT SESSION: Continue to increase quadriceps strength, independence with her HEP and transition Amy Escobar into independent rehabilitation with DC in 4  weeks.   Farley Ly, PT, MPT 08/06/22 2:00 PM

## 2022-08-07 ENCOUNTER — Ambulatory Visit (INDEPENDENT_AMBULATORY_CARE_PROVIDER_SITE_OTHER): Payer: 59 | Admitting: Family

## 2022-08-07 ENCOUNTER — Other Ambulatory Visit (INDEPENDENT_AMBULATORY_CARE_PROVIDER_SITE_OTHER): Payer: 59

## 2022-08-07 ENCOUNTER — Encounter: Payer: Self-pay | Admitting: Family

## 2022-08-07 ENCOUNTER — Other Ambulatory Visit: Payer: Self-pay | Admitting: Family

## 2022-08-07 ENCOUNTER — Encounter: Payer: Self-pay | Admitting: *Deleted

## 2022-08-07 VITALS — BP 118/72 | HR 88 | Resp 18 | Ht 64.0 in | Wt 164.6 lb

## 2022-08-07 DIAGNOSIS — R058 Other specified cough: Secondary | ICD-10-CM

## 2022-08-07 DIAGNOSIS — D649 Anemia, unspecified: Secondary | ICD-10-CM | POA: Diagnosis not present

## 2022-08-07 DIAGNOSIS — R790 Abnormal level of blood mineral: Secondary | ICD-10-CM | POA: Diagnosis not present

## 2022-08-07 DIAGNOSIS — R5383 Other fatigue: Secondary | ICD-10-CM

## 2022-08-07 DIAGNOSIS — E538 Deficiency of other specified B group vitamins: Secondary | ICD-10-CM

## 2022-08-07 DIAGNOSIS — E559 Vitamin D deficiency, unspecified: Secondary | ICD-10-CM | POA: Diagnosis not present

## 2022-08-07 LAB — CBC WITH DIFFERENTIAL/PLATELET
Basophils Absolute: 0 10*3/uL (ref 0.0–0.1)
Basophils Relative: 0.3 % (ref 0.0–3.0)
Eosinophils Absolute: 0.2 10*3/uL (ref 0.0–0.7)
Eosinophils Relative: 2.6 % (ref 0.0–5.0)
HCT: 38.9 % (ref 36.0–46.0)
Hemoglobin: 12.5 g/dL (ref 12.0–15.0)
Lymphocytes Relative: 41.4 % (ref 12.0–46.0)
Lymphs Abs: 3 10*3/uL (ref 0.7–4.0)
MCHC: 32.2 g/dL (ref 30.0–36.0)
MCV: 86.1 fl (ref 78.0–100.0)
Monocytes Absolute: 0.8 10*3/uL (ref 0.1–1.0)
Monocytes Relative: 10.7 % (ref 3.0–12.0)
Neutro Abs: 3.2 10*3/uL (ref 1.4–7.7)
Neutrophils Relative %: 45 % (ref 43.0–77.0)
Platelets: 198 10*3/uL (ref 150.0–400.0)
RBC: 4.52 Mil/uL (ref 3.87–5.11)
RDW: 13.9 % (ref 11.5–15.5)
WBC: 7.2 10*3/uL (ref 4.0–10.5)

## 2022-08-07 LAB — COMPREHENSIVE METABOLIC PANEL
ALT: 15 U/L (ref 0–35)
AST: 17 U/L (ref 0–37)
Albumin: 4 g/dL (ref 3.5–5.2)
Alkaline Phosphatase: 56 U/L (ref 39–117)
BUN: 12 mg/dL (ref 6–23)
CO2: 28 mEq/L (ref 19–32)
Calcium: 9 mg/dL (ref 8.4–10.5)
Chloride: 102 mEq/L (ref 96–112)
Creatinine, Ser: 0.81 mg/dL (ref 0.40–1.20)
GFR: 70.21 mL/min (ref >60.00)
Glucose, Bld: 89 mg/dL (ref 70–99)
Potassium: 4.2 mEq/L (ref 3.5–5.1)
Sodium: 140 mEq/L (ref 135–145)
Total Bilirubin: 0.4 mg/dL (ref 0.2–1.2)
Total Protein: 6.9 g/dL (ref 6.0–8.3)

## 2022-08-07 LAB — MAGNESIUM: Magnesium: 1.9 mg/dL (ref 1.5–2.5)

## 2022-08-07 LAB — VITAMIN B12: Vitamin B-12: 529 pg/mL (ref 211–911)

## 2022-08-07 LAB — VITAMIN D 25 HYDROXY (VIT D DEFICIENCY, FRACTURES): VITD: 32.78 ng/mL (ref 30.00–100.00)

## 2022-08-07 NOTE — Progress Notes (Signed)
Pt has [facility] PCP and event screening results sent to her PCP via in-basket message (next PCP appt is today at 1P). Pt was given her own screening results at the time of the event, SDOH screening done, with no immediate needs, and is available for PCP review. No further health equity team support indicated at this time.

## 2022-08-07 NOTE — Progress Notes (Signed)
Amy Escobar is a 77 y.o. female with the following history as recorded in EpicCare:  Patient Active Problem List   Diagnosis Date Noted   Type 2 diabetes mellitus with diabetic polyneuropathy, without long-term current use of insulin ([place]) 06/18/2021   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 without macular edema, without long-term current use of insulin ([place]) 06/18/2021   Diabetes mellitus ([place]) 06/18/2021   Osteoarthritis of right AC (acromioclavicular) joint 03/06/2021   Osteoarthritis of right glenohumeral joint 03/06/2021   Foot pain, bilateral 12/26/2020   Diabetic neuropathy ([place]) 01/10/2020   Hav (hallux abducto valgus), unspecified laterality 01/10/2020   Chronic arthropathy 01/10/2020   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 edema associated with type 2 diabetes mellitus ([place]) 12/26/2019   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   AC (acromioclavicular) arthritis 08/02/2019   Unspecified inflammatory spondylopathy, 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[place]) 04/19/2019   Claudication ([place]) 04/19/2019   Morbid obesity ([place]) 04/19/2019   Suspected COVID-19 virus infection 04/13/2019   Upper airway cough syndrome 08/22/2018   Greater trochanteric bursitis of both hips 06/15/2018   Trigger point of shoulder region, left 02/07/2018   Hypertension    Mild pulmonary hypertension (HCC)    NSVT (nonsustained ventricular tachycardia) (HCC)    PVC's (premature ventricular contractions)    URI (upper respiratory infection) 08/09/2016   Neck pain 02/10/2016   Urinary incontinence 02/10/2016   Degenerative arthritis of knee, bilateral 07/17/2015   Knee MCL sprain 06/07/2015   Discomfort in chest 02/15/2015   Chest pain 02/15/2015   DM type 2, controlled, with complication ([place])    Wellness examination 08/06/2014   Acute meniscal tear of knee 02/13/2014   Primary localized osteoarthrosis, lower leg  02/13/2014   Gastrocnemius tear 12/25/2013   UTI (urinary tract infection) 12/20/2013   Right knee pain 12/20/2013   Pulmonary hypertension ([place]) 10/06/2013   DOE (dyspnea on exertion) 08/04/2013   Dysphagia, unspecified(787.20) 08/10/2012   Hip pain, left 02/02/2012   Painful respiration 05/25/2011   Encounter for long-term (current) use of other medications 01/30/2011   PELVIC PAIN, LEFT 07/30/2010   TOBACCO USE, QUIT 07/07/2010   FATIGUE 12/20/2009   Headache 12/20/2009   Low back pain 12/26/2008   Disorder of liver 04/13/2008   FOOT PAIN, BILATERAL 04/13/2008   FIBROIDS, UTERUS 11/24/2007   THYROID NODULE, LEFT 11/24/2007   HYPERCHOLESTEROLEMIA 11/24/2007   CARPAL TUNNEL SYNDROME, BILATERAL 11/24/2007   ALKALINE PHOSPHATASE, ELEVATED 11/24/2007   Gout 08/10/2007   ANXIETY 08/10/2007   Essential hypertension 08/10/2007   Cough 08/01/2007   Perennial and seasonal allergic rhinitis 01/14/2007   GERD 01/14/2007   Osteoporosis 01/14/2007    Current Outpatient Medications  Medication Sig Dispense Refill   alendronate (FOSAMAX) 70 MG tablet Take 1 tablet (70 mg total) by mouth every 7 (seven) days. Take with a full glass of water on an empty stomach. 4 tablet 11   AMBULATORY NON FORMULARY MEDICATION Left wrist brace 1 Piece 0   aspirin EC 81 MG tablet Take 81 mg by mouth daily. Swallow whole.     Blood Glucose Monitoring Suppl (ONETOUCH VERIO FLEX SYSTEM) w/Device KIT USE TO TEST BLOOD SUGAR DAILY AS DIRECTED 1 kit 0   calcium carbonate (OSCAL) 1500 (600 Ca) MG TABS tablet Take by mouth 2 (two) times daily with a meal.     calcium elemental as carbonate (ANTACID MAXIMUM) 400 MG chewable tablet Chew 1,000 mg by mouth as needed for heartburn.  carvedilol (COREG) 12.5 MG tablet TAKE 1 TABLET BY MOUTH TWO TIMES A DAY WITH A MEAL 180 tablet 2   cephALEXin (KEFLEX) 250 MG capsule Take 250 mg by mouth daily.     cholecalciferol (VITAMIN D3) 25 MCG (1000 UNIT) tablet Take 1,000 Units  by mouth daily.     esomeprazole (NEXIUM) 40 MG capsule TAKE 1 CAPSULE BY MOUTH TWICE A DAY 30 TO 60 MINUTES BEFORE YOUR FIRST AND LAST MEALS OF DAY 180 capsule 0   famotidine (PEPCID) 20 MG tablet Take 1 tablet (20 mg total) by mouth at bedtime. 90 tablet 2   fesoterodine (TOVIAZ) 4 MG TB24 tablet Take 4 mg daily by mouth.     gabapentin (NEURONTIN) 300 MG capsule TAKE ONE CAPSULE BY MOUTH EVERY NIGHT AT BEDTIME 90 capsule 3   glucose blood (ONETOUCH VERIO) test strip USE TO CHECK BLOOD SUGAE TWO TIMES A DAY 100 strip 3   guaiFENesin (MUCINEX) 600 MG 12 hr tablet Take 600 mg by mouth 2 (two) times daily as needed.     Homeopathic Products (ZICAM COLD REMEDY NA) Place into the nose.     magnesium citrate SOLN Take 1 Bottle by mouth once.     montelukast (SINGULAIR) 10 MG tablet Take 1 tablet (10 mg total) by mouth daily. 90 tablet 2   Multiple Vitamins-Minerals (HAIR SKIN AND NAILS FORMULA PO) Take by mouth.     Respiratory Therapy Supplies MISC Use flutter valve as needed to break the coughing cycle. 1 each 1   rosuvastatin (CRESTOR) 20 MG tablet TAKE ONE TABLET BY MOUTH DAILY 90 tablet 2   Semaglutide,0.25 or 0.5MG/DOS, 2 MG/3ML SOPN Inject 0.5 mg into the skin once a week. 6 mL 3   Turmeric (QC TUMERIC COMPLEX) 500 MG CAPS Take by mouth.     valsartan (DIOVAN) 160 MG tablet Take 1 tablet (160 mg total) by mouth daily. 90 tablet 2   No current facility-administered medications for this visit.    Allergies: Olmesartan medoxomil  Past Medical History:  Diagnosis Date   Alkaline phosphatase deficiency    w/u Ne   Allergic rhinitis    Allergy    Anxiety    Arthritis    Cataract    BILATERAL-REMOVED   DM2 (diabetes mellitus, type 2) (HCC)    GERD (gastroesophageal reflux disease)    Gout    Hemorrhoids    Hyperlipidemia    Hypertension    Mild pulmonary hypertension (HCC)    NSVT (nonsustained ventricular tachycardia) (HCC)    Osteoporosis    PVC's (premature ventricular  contractions)    Thyroid nodule    small   Tubular adenoma of colon 2017   Urticaria    Uterine fibroid     Past Surgical History:  Procedure Laterality Date   CATARACT EXTRACTION Bilateral 2016   COLONOSCOPY  2007   echocardiogram (other)  01/16/2002   POLYPECTOMY     removed tumors from foot nerves  04/1999   stress cardiolite  02/12/2006   TOENAIL EXCISION     TUBAL LIGATION     TYMPANOSTOMY TUBE PLACEMENT      Family History  Problem Relation Age of Onset   Heart disease Mother    Allergic rhinitis Mother    Heart disease Father    Stomach cancer Maternal Grandmother    Heart disease Maternal Grandfather    Rectal cancer Maternal Grandfather    Colon cancer Neg Hx    Breast cancer Neg Hx  Colon polyps Neg Hx    Esophageal cancer Neg Hx     Social History   Tobacco Use   Smoking status: Former    Packs/day: 0.25    Years: 5.00    Total pack years: 1.25    Types: Cigarettes    Quit date: 06/29/1968    Years since quitting: 54.1   Smokeless tobacco: Never  Substance Use Topics   Alcohol use: No    Alcohol/week: 0.0 standard drinks of alcohol    Subjective:   Patient presents for 6 month follow up; has been feeling more tired recently and wants to make "sure everything is okay."  Scheduled to see her cardiologist next month; working with ortho for chronic left knee pain and is in PT 2 x per week;  Most recent Hgba1c shows excellent control at 5.7; has lost 8 pounds since starting Ozempic in October 2023;   Objective:  Vitals:   08/07/22 1256  BP: 118/72  Pulse: 88  Resp: 18  SpO2: 98%  Weight: 164 lb 9.6 oz (74.7 kg)  Height: 5' 4"$  (1.626 m)    General: Well developed, well nourished, in no acute distress  Skin : Warm and dry.  Head: Normocephalic and atraumatic  Eyes: Sclera and conjunctiva clear; pupils round and reactive to light; extraocular movements intact  Ears: External normal; canals clear; tympanic membranes normal  Oropharynx: Pink,  supple. No suspicious lesions  Neck: Supple without thyromegaly, adenopathy  Lungs: Respirations unlabored; clear to auscultation bilaterally without wheeze, rales, rhonchi  CVS exam: normal rate and regular rhythm.  Neurologic: Alert and oriented; speech intact; face symmetrical; moves all extremities well; CNII-XII intact without focal deficit   Assessment:  1. Other fatigue   2. Vitamin D deficiency   3. Disorder of vitamin B12   4. Low magnesium level   5. Anemia, unspecified type     Plan:  Will update labs today; follow up to be determined; patient had normal sleep study in 2020/ has had normal cardiac evaluation in the past year;   No follow-ups on file.  Orders Placed This Encounter  Procedures   CBC with Differential/Platelet   Comp Met (CMET)   B12   Vitamin D (25 hydroxy)   Magnesium   Iron, TIBC and Ferritin Panel    Requested Prescriptions    No prescriptions requested or ordered in this encounter

## 2022-08-07 NOTE — Addendum Note (Signed)
Addended by: Kem Boroughs D on: 08/07/2022 04:50 PM   Modules accepted: Orders

## 2022-08-10 LAB — IBC + FERRITIN
Ferritin: 53.8 ng/mL (ref 10.0–291.0)
Iron: 82 ug/dL (ref 42–145)
Saturation Ratios: 24.8 % (ref 20.0–50.0)
TIBC: 330.4 ug/dL (ref 250.0–450.0)
Transferrin: 236 mg/dL (ref 212.0–360.0)

## 2022-08-17 ENCOUNTER — Ambulatory Visit (INDEPENDENT_AMBULATORY_CARE_PROVIDER_SITE_OTHER): Payer: 59 | Admitting: Physician Assistant

## 2022-08-17 ENCOUNTER — Encounter: Payer: Self-pay | Admitting: Physician Assistant

## 2022-08-17 VITALS — Ht 64.0 in | Wt 162.0 lb

## 2022-08-17 DIAGNOSIS — M170 Bilateral primary osteoarthritis of knee: Secondary | ICD-10-CM | POA: Diagnosis not present

## 2022-08-17 DIAGNOSIS — M1711 Unilateral primary osteoarthritis, right knee: Secondary | ICD-10-CM

## 2022-08-17 DIAGNOSIS — M1712 Unilateral primary osteoarthritis, left knee: Secondary | ICD-10-CM | POA: Diagnosis not present

## 2022-08-17 MED ORDER — SODIUM HYALURONATE 60 MG/3ML IX PRSY
60.0000 mg | PREFILLED_SYRINGE | INTRA_ARTICULAR | Status: AC | PRN
  Administered 2022-08-17: 60 mg via INTRA_ARTICULAR

## 2022-08-17 NOTE — Progress Notes (Signed)
   Procedure Note  Patient: Amy Escobar             Date of Birth: 05/19/1946           MRN: [id number]             Visit Date: 08/17/2022  HPI: Amy Escobar comes in today for scheduled Durolane injections both knees. Patient has known medial and patellar femoral arthritis bilateral knees.  She has tried cortisone injections and physical therapy.  She has no scheduled surgery on either knee in the next 6 months.  Physical exam: Bilateral knee stiffness but she has full extension and flexion beyond 90 degrees bilaterally no abnormal warmth erythema or effusion of either knee.  Procedures: Visit Diagnoses:  1. Primary osteoarthritis of left knee   2. Primary osteoarthritis of right knee     Large Joint Inj: bilateral knee on 08/17/2022 4:40 PM Indications: pain Details: 22 G 1.5 in needle, anterolateral approach  Arthrogram: No  Medications (Right): 60 mg Sodium Hyaluronate 60 MG/3ML Medications (Left): 60 mg Sodium Hyaluronate 60 MG/3ML Outcome: tolerated well, no immediate complications Procedure, treatment alternatives, risks and benefits explained, specific risks discussed. Consent was given by the patient. Immediately prior to procedure a time out was called to verify the correct patient, procedure, equipment, support staff and site/side marked as required. Patient was prepped and draped in the usual sterile fashion.     Plan: Follow up as needed.  She knows to wait 3 months between cortisone injections in 6 months between viscosupplementation injections.

## 2022-08-18 ENCOUNTER — Encounter: Payer: 59 | Admitting: Physical Therapy

## 2022-08-20 ENCOUNTER — Ambulatory Visit (INDEPENDENT_AMBULATORY_CARE_PROVIDER_SITE_OTHER): Payer: 59 | Admitting: Rehabilitative and Restorative Service Providers"

## 2022-08-20 ENCOUNTER — Encounter: Payer: Self-pay | Admitting: Rehabilitative and Restorative Service Providers"

## 2022-08-20 DIAGNOSIS — M25662 Stiffness of left knee, not elsewhere classified: Secondary | ICD-10-CM | POA: Diagnosis not present

## 2022-08-20 DIAGNOSIS — M25562 Pain in left knee: Secondary | ICD-10-CM

## 2022-08-20 DIAGNOSIS — R600 Localized edema: Secondary | ICD-10-CM

## 2022-08-20 DIAGNOSIS — M25661 Stiffness of right knee, not elsewhere classified: Secondary | ICD-10-CM | POA: Diagnosis not present

## 2022-08-20 DIAGNOSIS — R262 Difficulty in walking, not elsewhere classified: Secondary | ICD-10-CM | POA: Diagnosis not present

## 2022-08-20 DIAGNOSIS — M25561 Pain in right knee: Secondary | ICD-10-CM | POA: Diagnosis not present

## 2022-08-20 NOTE — Therapy (Signed)
OUTPATIENT PHYSICAL THERAPY TREATMENT NOTE   Patient Name: Amy Escobar MRN: [id number] DOB:01/12/1946, 77 y.o., female Today's Date: 08/20/2022  PCP: [name], FNP  REFERRING PROVIDER: Mcarthur Rossetti, MD   END OF SESSION:   PT End of Session - 08/20/22 1510     Visit Number 16    Number of Visits 16    PT Start Time 1509    PT Stop Time [id number]    PT Time Calculation (min) 40 min    Activity Tolerance Patient tolerated treatment well;No increased pain;Patient limited by fatigue    Behavior During Therapy [facility] for tasks assessed/performed             Past Medical History:  Diagnosis Date   Alkaline phosphatase deficiency    w/u Ne   Allergic rhinitis    Allergy    Anxiety    Arthritis    Cataract    BILATERAL-REMOVED   DM2 (diabetes mellitus, type 2) ([place])    GERD (gastroesophageal reflux disease)    Gout    Hemorrhoids    Hyperlipidemia    Hypertension    Mild pulmonary hypertension (HCC)    NSVT (nonsustained ventricular tachycardia) (HCC)    Osteoporosis    PVC's (premature ventricular contractions)    Thyroid nodule    small   Tubular adenoma of colon 2017   Urticaria    Uterine fibroid    Past Surgical History:  Procedure Laterality Date   CATARACT EXTRACTION Bilateral 2016   COLONOSCOPY  2007   echocardiogram (other)  01/16/2002   POLYPECTOMY     removed tumors from foot nerves  04/1999   stress cardiolite  02/12/2006   TOENAIL EXCISION     TUBAL LIGATION     TYMPANOSTOMY TUBE PLACEMENT     Patient Active Problem List   Diagnosis Date Noted   Type 2 diabetes mellitus with diabetic polyneuropathy, without long-term current use of insulin ([place]) 06/18/2021   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 without macular edema, without long-term current use of insulin ([place]) 06/18/2021   Diabetes mellitus ([place]) 06/18/2021   Osteoarthritis of right AC (acromioclavicular) joint  03/06/2021   Osteoarthritis of right glenohumeral joint 03/06/2021   Foot pain, bilateral 12/26/2020   Diabetic neuropathy ([place]) 01/10/2020   Hav (hallux abducto valgus), unspecified laterality 01/10/2020   Chronic arthropathy 01/10/2020   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 edema associated with type 2 diabetes mellitus ([place]) 12/26/2019   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   AC (acromioclavicular) arthritis 08/02/2019   Unspecified inflammatory spondylopathy, 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[place]) 04/19/2019   Claudication ([place]) 04/19/2019   Morbid obesity ([place]) 04/19/2019   Suspected COVID-19 virus infection 04/13/2019   Upper airway cough syndrome 08/22/2018   Greater trochanteric bursitis of both hips 06/15/2018   Trigger point of shoulder region, left 02/07/2018   Hypertension    Mild pulmonary hypertension (HCC)    NSVT (nonsustained ventricular tachycardia) (HCC)    PVC's (premature ventricular contractions)    URI (upper respiratory infection) 08/09/2016   Neck pain 02/10/2016   Urinary incontinence 02/10/2016   Degenerative arthritis of knee, bilateral 07/17/2015   Knee MCL sprain 06/07/2015   Discomfort in chest 02/15/2015   Chest pain 02/15/2015   DM type 2, controlled, with complication ([place])    Wellness examination 08/06/2014   Acute meniscal tear of knee 02/13/2014   Primary localized osteoarthrosis, lower leg 02/13/2014   Gastrocnemius tear  12/25/2013   UTI (urinary tract infection) 12/20/2013   Right knee pain 12/20/2013   Pulmonary hypertension ([place]) 10/06/2013   DOE (dyspnea on exertion) 08/04/2013   Dysphagia, unspecified(787.20) 08/10/2012   Hip pain, left 02/02/2012   Painful respiration 05/25/2011   Encounter for long-term (current) use of other medications 01/30/2011   PELVIC PAIN, LEFT 07/30/2010   TOBACCO USE, QUIT 07/07/2010   FATIGUE 12/20/2009   Headache 12/20/2009   Low back pain 12/26/2008   Disorder of  liver 04/13/2008   FOOT PAIN, BILATERAL 04/13/2008   FIBROIDS, UTERUS 11/24/2007   THYROID NODULE, LEFT 11/24/2007   HYPERCHOLESTEROLEMIA 11/24/2007   CARPAL TUNNEL SYNDROME, BILATERAL 11/24/2007   ALKALINE PHOSPHATASE, ELEVATED 11/24/2007   Gout 08/10/2007   ANXIETY 08/10/2007   Essential hypertension 08/10/2007   Cough 08/01/2007   Perennial and seasonal allergic rhinitis 01/14/2007   GERD 01/14/2007   Osteoporosis 01/14/2007    REFERRING DIAG:  M25.562,G89.29 (ICD-10-CM) - Chronic pain of left knee  M25.561,G89.29 (ICD-10-CM) - Chronic pain of right knee  S83.232D (ICD-10-CM) - Complex tear of medial meniscus of left knee as current injury, subsequent encounter    THERAPY DIAG:  Difficulty in walking, not elsewhere classified  Localized edema  Stiffness of left knee, not elsewhere classified  Stiffness of right knee, not elsewhere classified  Pain in both knees, unspecified chronicity  Rationale for Evaluation and Treatment Rehabilitation  PERTINENT HISTORY: Type 2 DM, HLD, HTN, osteoporosis   PRECAUTIONS: none  SUBJECTIVE:                                                                                                                                                                                      SUBJECTIVE STATEMENT:   [name] says she got her gel shots 4 days ago.  She is not happy with these shots.  She has had the gel shots before and this is the first time she had pain with them.    PAIN:  Are you having pain? 5-7/10 in her left knee and 3-6/10 right knee this week.  Description: Ache Location: Bil knees Aggravating factors: Prolonged postures, rain, cold weather Relieving factors: Ice, exercise, movement    OBJECTIVE: (objective measures completed at initial evaluation unless otherwise dated) DIAGNOSTIC FINDINGS:    Left knee IMPRESSION: 1. Root tear of the posterior horn of the medial meniscus with resulting mild peripheral extrusion of the  meniscus from the joint space. 2. Mild medial and patellofemoral compartment degenerative chondrosis. No acute osseous findings. 3. Intact lateral meniscus, cruciate and collateral ligaments. 4. Heterogeneous, serpiginous T2 hyperintensity centrally and laterally in Hoffa's fat may reflect a venous malformation or atypical ganglion.  Right knee IMPRESSION: 1. Chronic root tear of the posterior horn of the medial meniscus with progressive peripheral extrusion of the meniscus from the joint space. 2. Progressive medial compartment osteoarthritis. No acute osseous findings. 3. The lateral meniscus, cruciate and collateral ligaments are intact. 4. Small joint effusion and small Baker's cyst.   PATIENT SURVEYS:  06/19/2022 FOTO 78 (Goal met)  05/18/2022 FOTO 59 (Goal 63 in 12 visits)   COGNITION: Overall cognitive status: Within functional limits for tasks assessed                         SENSATION: No complaints of peripheral paresthesias, new tingling or numbness   EDEMA:  Noted and not objectively assessed for bilateral knees   LOWER EXTREMITY ROM:   Active ROM Right eval Left eval Rt/Left 05/26/22 Rt/left 06/03/22 Rt/Lt 06/30/2021 Left/Right 07/16/2022  Hip flexion          Hip extension          Hip abduction          Hip adduction          Hip internal rotation          Hip external rotation          Knee flexion 124 90 122/110 122/114 134/132 136/134  Knee extension -2 0 0/0 0/0 0/0 0/0  Ankle dorsiflexion          Ankle plantarflexion          Ankle inversion          Ankle eversion           (Blank rows = not tested)   LOWER EXTREMITY STRENGTH:   Strength in pounds assessed by hand-held dynamometer Right 05/18/22 Left 05/18/22 Left/Right in pounds 06/19/22 Left/Rt 07/07/21 Left/Right 07/16/2022 Left/Right 08/06/2022  Hip flexion          Hip extension          Hip abduction          Hip adduction          Hip internal rotation          Hip external  rotation          Knee flexion          Knee extension 19.2 17.3 32.7/43.2 35.8/38.8 30.5/36.5 27.1/31.9  Ankle dorsiflexion          Ankle plantarflexion          Ankle inversion          Ankle eversion           (Blank rows = not tested)   GAIT: Distance walked: In the clinic Assistive device utilized: None (was Quad cane small base) Level of assistance: Complete Independence Comments: Rarely uses the cane (previously used the cane almost of the time)     TODAY'S TREATMENT:  08/20/2022 Bridges 2 sets of 10 5 seconds Quadriceps sets 10X 5 seconds Seated straight leg raises 4 sets of 5 for 3 seconds with 1# weight   Functional Activities: Double Leg Press 50# 25X slow eccentrics Single Leg Press 25# Bil 15X slow eccentrics Step-down off 2 inch step 10X slow eccentrics Bil Step-up and over 4 inch step 10X slow eccentrics   08/06/2022 Bridges 2 sets of 10 5 seconds Quadriceps sets 10X 5 seconds Seated straight leg raises 3 sets of 5 for 3 seconds with 1# weight   Functional Activities: Double Leg Press 50# 15X  slow eccentrics Single Leg Press 25# Bil 10X slow eccentrics   07/16/2022 Bridges 2 sets of 10 5 seconds Quadriceps sets 10X 5 seconds Seated straight leg raises 4 sets of 5 for 3 seconds with 1# weight   Functional Activities: Double Leg Press 50# 25X slow eccentrics Single Leg Press 25# Bil 15X slow eccentrics Step-down off 4 inch step 5X slow eccentrics Bil Step-up and over 6 & 8 inch step 10X slow eccentrics   07/14/22:  TherEx:  Scifit: Level 3 x 6 minutes Leg Press: bil LE's 81# x 25 Leg Press: single LE  56#  2 x 10  Slant board: x 1 minute Step ups on 8 inch step c single UE support x 20 Neuromuscular Re-edu:  Side stepping x 20 feet x 4 Tandum stance: c CGA to Close supervision x 3 with each LE leading  Bil calf raise, then lifting one leg off ground and lowering with single leg x 5 each LE c UE support   PATIENT EDUCATION:  Education details:  exercise form and purpose, walking program or even consider walking in the pool to take stress off of knees   Person educated: Patient Education method: Explanation : verbalized understanding, returned demonstration, verbal cues required, tactile cues required, and needs further education   HOME EXERCISE PROGRAM: Access Code: [email or web address] URL: [email or web address] Date: 08/06/2022 Prepared by: Vista Mink  Exercises - Supine Quadricep Sets  - 3-5 x daily - 4-5 x weekly - 2-3 sets - 10 reps - 5 second hold - Seated Knee Flexion AAROM  - 3 x daily - 1 x weekly - 1 sets - 1 reps - 3 minutes hold - Small Range Straight Leg Raise  - 3-5 x daily - 4-5 x weekly - 2 sets - 5 reps - 3 seconds hold - Yoga Bridge  - 1 x daily - 4-5 x weekly - 2-3 sets - 10 reps - 5 seconds hold - Lateral Step Down  - 1 x daily - 3 x weekly - 2-3 sets - 10 reps   ASSESSMENT:   CLINICAL IMPRESSION: Filza reports she had more pain with this round of knee injections than previously.  Because her strong preference is to avoid any surgeries, she has another 2-3 weeks of supervised PT to get quadriceps strength back up to acceptable levels and make sure she is comfortable with her long-term HEP before transfer into independent PT at the end of the 2-3 weeks.  I also encouraged Gracilyn to start looking at gyms so that she may more easily transfer into independent rehabilitation at the end of the next 2 to 3 weeks.    OBJECTIVE IMPAIRMENTS: Abnormal gait, decreased activity tolerance, decreased endurance, decreased knowledge of condition, difficulty walking, decreased ROM, decreased strength, increased edema, and pain.    ACTIVITY LIMITATIONS: carrying, lifting, bending, sitting, standing, squatting, stairs, bed mobility, and locomotion level   PARTICIPATION LIMITATIONS: driving, shopping, and community activity   PERSONAL FACTORS: Type 2 DM, HLD, HTN, osteoporosis are also affecting patient's functional outcome.     REHAB POTENTIAL: Good   CLINICAL DECISION MAKING: Stable/uncomplicated   EVALUATION COMPLEXITY: Low     GOALS: Goals reviewed with patient? Yes   SHORT TERM GOALS: Target date: 07/13/2022  Blondine will improve her bilateral quadriceps strength to at least 30 pounds Baseline: Less than 20 pounds Goal status: MET 06/19/22   2.  Tila will improve her left knee flexion active range of motion to at least 110 degrees Baseline: 90 degrees  Goal status: MET 05/26/22   3.  Taylin will demonstrate independence with her day 1 home exercise program Baseline: Started 1120/2023 Goal status: MET 05/26/22       LONG TERM GOALS: Target date: 09/02/2021   Improve Foto to 63 Baseline: 59 Goal status: MET 06/19/2022   2.  Krisandra will report bilateral knee pain consistently 0-4 out of 10 on the visual analog scale (VAS) Baseline: 3-7 out of 10 Goal status: On Going 08/20/2022   3.  Improve bilateral quadriceps strength to at least 50 pounds bilaterally Baseline: Less than 20 pounds Goal status: On Going 08/20/2022   4.  Improve bilateral knee flexion active range of motion to 125 degrees and extension to 0 degrees Baseline: 90/124 and 0/-2 Goal status: Met 06/30/2022   5.  Jessamy will be independent with her long-term home exercise program at discharge Baseline: Started 05/18/2022 Goal status: On Going 08/20/2022   PLAN:   PT FREQUENCY: 1-2 X/week   PT DURATION: 4 weeks then DC to independent PT   PLANNED INTERVENTIONS: Therapeutic exercises, Therapeutic activity, Neuromuscular re-education, Balance training, Gait training, Patient/Family education, Self Care, Stair training, Cryotherapy, and Vasopneumatic device   PLAN FOR NEXT SESSION: Continue to increase quadriceps strength, independence with her HEP and transition Lanie into independent rehabilitation with DC in 4 weeks.   Farley Ly, PT, MPT 08/20/22 4:05 PM

## 2022-08-24 NOTE — Progress Notes (Unsigned)
Office Visit    Patient Name: Amy Escobar Date of Encounter: 08/24/2022  Primary Care Provider:  [name], [place] Primary Cardiologist:  Freada Bergeron, MD Primary Electrophysiologist: None  Chief Complaint    Amy Escobar is a 77 y.o. female with PMH of HTN, HLD, IDDM, nonobstructive CAD, moderate pulmonary HTN who presents today for follow-up of dyspnea on exertion and shortness of breath.  Past Medical History    Past Medical History:  Diagnosis Date   Alkaline phosphatase deficiency    w/u Ne   Allergic rhinitis    Allergy    Anxiety    Arthritis    Cataract    BILATERAL-REMOVED   DM2 (diabetes mellitus, type 2) (HCC)    GERD (gastroesophageal reflux disease)    Gout    Hemorrhoids    Hyperlipidemia    Hypertension    Mild pulmonary hypertension (HCC)    NSVT (nonsustained ventricular tachycardia) ([place])    Osteoporosis    PVC's (premature ventricular contractions)    Thyroid nodule    small   Tubular adenoma of colon 2017   Urticaria    Uterine fibroid    Past Surgical History:  Procedure Laterality Date   CATARACT EXTRACTION Bilateral 2016   COLONOSCOPY  2007   echocardiogram (other)  01/16/2002   POLYPECTOMY     removed tumors from foot nerves  04/1999   stress cardiolite  02/12/2006   TOENAIL EXCISION     TUBAL LIGATION     TYMPANOSTOMY TUBE PLACEMENT      Allergies  Allergies  Allergen Reactions   Olmesartan Medoxomil     REACTION: headache    History of Present Illness    Amy Escobar is a 77 year old female with the above-mentioned past medical history who presents today for 34-monthfollow-up of coronary artery disease.  She was previously followed by Dr. NMeda Coffeeand is currently seeing Dr. PJohney Frame  Patient has a history of nonobstructive CAD that was diagnosed following coronary CTA in 2018.  Patient's calcium score was 220 and had moderate plaque in the ostial left main and mild plaque in the ostial RCA.   2D echo was completed in 02/2019 due to complaint of dyspnea on exertion and chest pain.  EF of 55-60% and borderline enlarged pulmonary artery, normal RVSP.  Lexiscan Myoview was completed 02/2019 that was normal with no ischemia. She was last seen by Dr. PJohney Frameon 08/2021 and was doing well from a cardiovascular standpoint.  She was dealing with COVID infection and had residual congestion.  She had no medication changes made and presents today for 626-monthollow-up.  Patient had report of shortness of breath [facility] normal perfusion and no evidence of ischemia.  2D echo was also ordered and revealed an EF 55-60%, GLS -20.7%, normal RV, mild MR. By Dr. PeJohney Framen 04/21/2022 and was feeling well overall.  She was still recovering from her fall in May and was having some difficulty with her legs.  She was pending an MRI and recently underwent Durolane injections in both knees.  Amy Escobar today 1 month follow-up.  Since last being seen in the office patient reports that she has been doing well from a cardiac perspective and denies any palpitations or chest discomfort.  Her blood pressure today is well-controlled at 110/62 and heart rate was 84 bpm.  She does report some fatigue that she has been experiencing over the  past few months.  She has previously been tested for sleep apnea and reports that her sleepiness is associated with daytime somnolence.  In reviewing her medications she is on gabapentin 300 mg at night.  She was advised to reduce this dose or possibly hold it to see if it helps with her fatigue and sleepiness.  During her visit we discussed her echocardiogram and Lexiscan results.  She unfortunately received a call during our visit that a good friend had passed away.  I offered my condolences and she was fine with completing her visit at that time.  Patient denies chest pain, palpitations, dyspnea, PND, orthopnea, nausea, vomiting,  dizziness, syncope, edema, weight gain, or early satiety.  Home Medications    Current Outpatient Medications  Medication Sig Dispense Refill   alendronate (FOSAMAX) 70 MG tablet Take 1 tablet (70 mg total) by mouth every 7 (seven) days. Take with a full glass of water on an empty stomach. 4 tablet 11   AMBULATORY NON FORMULARY MEDICATION Left wrist brace 1 Piece 0   aspirin EC 81 MG tablet Take 81 mg by mouth daily. Swallow whole.     Blood Glucose Monitoring Suppl (ONETOUCH VERIO FLEX SYSTEM) w/Device KIT USE TO TEST BLOOD SUGAR DAILY AS DIRECTED 1 kit 0   calcium carbonate (OSCAL) 1500 (600 Ca) MG TABS tablet Take by mouth 2 (two) times daily with a meal.     calcium elemental as carbonate (ANTACID MAXIMUM) 400 MG chewable tablet Chew 1,000 mg by mouth as needed for heartburn.     carvedilol (COREG) 12.5 MG tablet TAKE 1 TABLET BY MOUTH TWO TIMES A DAY WITH A MEAL 180 tablet 2   cephALEXin (KEFLEX) 250 MG capsule Take 250 mg by mouth daily.     cholecalciferol (VITAMIN D3) 25 MCG (1000 UNIT) tablet Take 1,000 Units by mouth daily.     esomeprazole (NEXIUM) 40 MG capsule TAKE 1 CAPSULE BY MOUTH TWICE A DAY 30 TO 60 MINUTES BEFORE YOUR FIRST AND LAST MEALS OF DAY 180 capsule 0   famotidine (PEPCID) 20 MG tablet Take 1 tablet (20 mg total) by mouth at bedtime. 90 tablet 2   fesoterodine (TOVIAZ) 4 MG TB24 tablet Take 4 mg daily by mouth.     gabapentin (NEURONTIN) 300 MG capsule TAKE ONE CAPSULE BY MOUTH EVERY NIGHT AT BEDTIME 90 capsule 3   glucose blood (ONETOUCH VERIO) test strip USE TO CHECK BLOOD SUGAE TWO TIMES A DAY 100 strip 3   guaiFENesin (MUCINEX) 600 MG 12 hr tablet Take 600 mg by mouth 2 (two) times daily as needed.     Homeopathic Products (ZICAM COLD REMEDY NA) Place into the nose.     magnesium citrate SOLN Take 1 Bottle by mouth once.     montelukast (SINGULAIR) 10 MG tablet Take 1 tablet (10 mg total) by mouth daily. 90 tablet 2   Multiple Vitamins-Minerals (HAIR SKIN AND  NAILS FORMULA PO) Take by mouth.     Respiratory Therapy Supplies MISC Use flutter valve as needed to break the coughing cycle. 1 each 1   rosuvastatin (CRESTOR) 20 MG tablet TAKE ONE TABLET BY MOUTH DAILY 90 tablet 2   Semaglutide,0.25 or 0.'5MG'$ /DOS, 2 MG/3ML SOPN Inject 0.5 mg into the skin once a week. 6 mL 3   Turmeric (QC TUMERIC COMPLEX) 500 MG CAPS Take by mouth.     valsartan (DIOVAN) 160 MG tablet Take 1 tablet (160 mg total) by mouth daily. 90 tablet 2   No  current facility-administered medications for this visit.     Review of Systems  Please see the history of present illness.    (+) Fatigue (+) Tiredness, left knee pain  All other systems reviewed and are otherwise negative except as noted above.  Physical Exam    Wt Readings from Last 3 Encounters:  08/17/22 162 lb (73.5 kg)  08/07/22 164 lb 9.6 oz (74.7 kg)  04/28/22 172 lb (78 kg)   [id number] were no vitals filed for this visit.,There is no height or weight on file to calculate BMI.  Constitutional:      Appearance: Healthy appearance. Not in distress.  Neck:     Vascular: JVD normal.  Pulmonary:     Effort: Pulmonary effort is normal.     Breath sounds: No wheezing. No rales. Diminished in the bases Cardiovascular:     Normal rate. Regular rhythm. Normal S1. Normal S2.      Murmurs: There is no murmur.  Edema:    Peripheral edema absent.  Abdominal:     Palpations: Abdomen is soft non tender. There is no hepatomegaly.  Skin:    General: Skin is warm and dry.  Neurological:     General: No focal deficit present.     Mental Status: Alert and oriented to person, place and time.     Cranial Nerves: Cranial nerves are intact.  EKG/LABS/Other Studies Reviewed    ECG personally reviewed by me today -none completed today    Lab Results  Component Value Date   WBC 7.2 08/07/2022   HGB 12.5 08/07/2022   HCT 38.9 08/07/2022   MCV 86.1 08/07/2022   PLT 198.0 08/07/2022   Lab Results  Component Value Date    CREATININE 0.81 08/07/2022   BUN 12 08/07/2022   NA 140 08/07/2022   K 4.2 08/07/2022   CL 102 08/07/2022   CO2 28 08/07/2022   Lab Results  Component Value Date   ALT 15 08/07/2022   AST 17 08/07/2022   ALKPHOS 56 08/07/2022   BILITOT 0.4 08/07/2022   Lab Results  Component Value Date   CHOL 132 09/16/2021   HDL 41.20 09/16/2021   LDLCALC 71 09/16/2021   LDLDIRECT 165.6 06/19/2009   TRIG 99.0 09/16/2021   CHOLHDL 3 09/16/2021    Lab Results  Component Value Date   HGBA1C 5.7 07/31/2022    Assessment & Plan    1.  Nonobstructive CAD: -Nonobstructive CAD diagnosed following CTA in 2018 -Patient currently on GDMT with ASA 81 mg, Crestor 20 mg -Today patient reports no recurrence of chest discomfort or shortness of breath since her previous visit.   2.  HTN: -Blood pressure today was today is well controlled at 110/62 -Continue carvedilol 12.5 mg twice daily, valsartan 160 mg daily   3.  HLD: -Patient's last LDL cholesterol was slightly above goal at 71 -Continue Crestor 20 mg daily   4.  Pulmonary hypertension: -2D echo with updated on 03/21/2022 and showed normal heart pumping function with stable pulmonary artery pressures.   5.  Palpitations: -Patient reports palpitations have resolved and are no longer bothersome.  Fatigue: -Patient reports increased fatigue that has been very bothersome over the past few months. -Advised her to hold her gabapentin for a few days to see if this helps with her fatigue. -She will reach out to [place] if this does not help and we will make additional adjustments with her medications if needed.  Disposition: Follow-up with Freada Bergeron, MD as  scheduled  Medication Adjustments/Labs and Tests Ordered: Current medicines are reviewed at length with the patient today.  Concerns regarding medicines are outlined above.   Signed, Mable Fill, Marissa Nestle, NP 08/24/2022, 12:37 PM [facility]  Note:  This  document was prepared using Dragon voice recognition software and may include unintentional dictation errors.

## 2022-08-25 ENCOUNTER — Ambulatory Visit (INDEPENDENT_AMBULATORY_CARE_PROVIDER_SITE_OTHER): Payer: 59 | Admitting: Physical Therapy

## 2022-08-25 ENCOUNTER — Encounter: Payer: Self-pay | Admitting: Physical Therapy

## 2022-08-25 DIAGNOSIS — M25661 Stiffness of right knee, not elsewhere classified: Secondary | ICD-10-CM

## 2022-08-25 DIAGNOSIS — R600 Localized edema: Secondary | ICD-10-CM

## 2022-08-25 DIAGNOSIS — M25562 Pain in left knee: Secondary | ICD-10-CM

## 2022-08-25 DIAGNOSIS — M25561 Pain in right knee: Secondary | ICD-10-CM

## 2022-08-25 DIAGNOSIS — M25662 Stiffness of left knee, not elsewhere classified: Secondary | ICD-10-CM | POA: Diagnosis not present

## 2022-08-25 DIAGNOSIS — R262 Difficulty in walking, not elsewhere classified: Secondary | ICD-10-CM | POA: Diagnosis not present

## 2022-08-25 NOTE — Therapy (Signed)
OUTPATIENT PHYSICAL THERAPY TREATMENT NOTE   Patient Name: Amy Escobar MRN: [id number] DOB:20-Aug-1945, 77 y.o., female Today's Date: 08/25/2022  PCP: [name], FNP  REFERRING PROVIDER: Mcarthur Rossetti, MD   END OF SESSION:   PT End of Session - 08/25/22 1030     Visit Number 17    Number of Visits 23    Date for PT Re-Evaluation 09/03/22    Progress Note Due on Visit 25   Progress note and Recert sent on [id number] at 15th visit   PT Start Time 1020    PT Stop Time 1058    PT Time Calculation (min) 38 min    Activity Tolerance Patient tolerated treatment well;Patient limited by fatigue;No increased pain    Behavior During Therapy WFL for tasks assessed/performed              Past Medical History:  Diagnosis Date   Alkaline phosphatase deficiency    w/u Ne   Allergic rhinitis    Allergy    Anxiety    Arthritis    Cataract    BILATERAL-REMOVED   DM2 (diabetes mellitus, type 2) (HCC)    GERD (gastroesophageal reflux disease)    Gout    Hemorrhoids    Hyperlipidemia    Hypertension    Mild pulmonary hypertension (HCC)    NSVT (nonsustained ventricular tachycardia) (HCC)    Osteoporosis    PVC's (premature ventricular contractions)    Thyroid nodule    small   Tubular adenoma of colon 2017   Urticaria    Uterine fibroid    Past Surgical History:  Procedure Laterality Date   CATARACT EXTRACTION Bilateral 2016   COLONOSCOPY  2007   echocardiogram (other)  01/16/2002   POLYPECTOMY     removed tumors from foot nerves  04/1999   stress cardiolite  02/12/2006   TOENAIL EXCISION     TUBAL LIGATION     TYMPANOSTOMY TUBE PLACEMENT     Patient Active Problem List   Diagnosis Date Noted   Type 2 diabetes mellitus with diabetic polyneuropathy, without long-term current use of insulin ([place]) 06/18/2021   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 without macular edema, without long-term current use  of insulin ([place]) 06/18/2021   Diabetes mellitus ([place]) 06/18/2021   Osteoarthritis of right AC (acromioclavicular) joint 03/06/2021   Osteoarthritis of right glenohumeral joint 03/06/2021   Foot pain, bilateral 12/26/2020   Diabetic neuropathy ([place]) 01/10/2020   Hav (hallux abducto valgus), unspecified laterality 01/10/2020   Chronic arthropathy 01/10/2020   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 edema associated with type 2 diabetes mellitus ([place]) 12/26/2019   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   AC (acromioclavicular) arthritis 08/02/2019   Unspecified inflammatory spondylopathy, 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/2019   Claudication ([place]) 04/19/2019   Morbid obesity ([place]) 04/19/2019   Suspected COVID-19 virus infection 04/13/2019   Upper airway cough syndrome 08/22/2018   Greater trochanteric bursitis of both hips 06/15/2018   Trigger point of shoulder region, left 02/07/2018   Hypertension    Mild pulmonary hypertension (HCC)    NSVT (nonsustained ventricular tachycardia) (HCC)    PVC's (premature ventricular contractions)    URI (upper respiratory infection) 08/09/2016   Neck pain 02/10/2016   Urinary incontinence 02/10/2016   Degenerative arthritis of knee, bilateral 07/17/2015   Knee MCL sprain 06/07/2015   Discomfort in chest 02/15/2015   Chest pain 02/15/2015   DM type 2, controlled,  with complication ([place])    Wellness examination 08/06/2014   Acute meniscal tear of knee 02/13/2014   Primary localized osteoarthrosis, lower leg 02/13/2014   Gastrocnemius tear 12/25/2013   UTI (urinary tract infection) 12/20/2013   Right knee pain 12/20/2013   Pulmonary hypertension ([place]) 10/06/2013   DOE (dyspnea on exertion) 08/04/2013   Dysphagia, unspecified(787.20) 08/10/2012   Hip pain, left 02/02/2012   Painful respiration 05/25/2011   Encounter for long-term (current) use of other medications 01/30/2011   PELVIC PAIN, LEFT 07/30/2010    TOBACCO USE, QUIT 07/07/2010   FATIGUE 12/20/2009   Headache 12/20/2009   Low back pain 12/26/2008   Disorder of liver 04/13/2008   FOOT PAIN, BILATERAL 04/13/2008   FIBROIDS, UTERUS 11/24/2007   THYROID NODULE, LEFT 11/24/2007   HYPERCHOLESTEROLEMIA 11/24/2007   CARPAL TUNNEL SYNDROME, BILATERAL 11/24/2007   ALKALINE PHOSPHATASE, ELEVATED 11/24/2007   Gout 08/10/2007   ANXIETY 08/10/2007   Essential hypertension 08/10/2007   Cough 08/01/2007   Perennial and seasonal allergic rhinitis 01/14/2007   GERD 01/14/2007   Osteoporosis 01/14/2007    REFERRING DIAG:  M25.562,G89.29 (ICD-10-CM) - Chronic pain of left knee  M25.561,G89.29 (ICD-10-CM) - Chronic pain of right knee  S83.232D (ICD-10-CM) - Complex tear of medial meniscus of left knee as current injury, subsequent encounter    THERAPY DIAG:  Difficulty in walking, not elsewhere classified  Localized edema  Stiffness of left knee, not elsewhere classified  Stiffness of right knee, not elsewhere classified  Pain in both knees, unspecified chronicity  Rationale for Evaluation and Treatment Rehabilitation  PERTINENT HISTORY: Type 2 DM, HLD, HTN, osteoporosis   PRECAUTIONS: none  SUBJECTIVE:                                                                                                                                                                                      SUBJECTIVE STATEMENT:   Pt arriving to therapy reporting 8/10 pain on her left knee.   PAIN:  Are you having pain? 8/10 in left knee Description: Ache Location: Bil knees Aggravating factors: Prolonged postures, rain, cold weather Relieving factors: Ice, exercise, movement    OBJECTIVE: (objective measures completed at initial evaluation unless otherwise dated) DIAGNOSTIC FINDINGS:    Left knee IMPRESSION: 1. Root tear of the posterior horn of the medial meniscus with resulting mild peripheral extrusion of the meniscus from the  joint space. 2. Mild medial and patellofemoral compartment degenerative chondrosis. No acute osseous findings. 3. Intact lateral meniscus, cruciate and collateral ligaments. 4. Heterogeneous, serpiginous T2 hyperintensity centrally and laterally in Hoffa's fat may reflect a venous malformation or atypical ganglion.   Right knee IMPRESSION: 1. Chronic root  tear of the posterior horn of the medial meniscus with progressive peripheral extrusion of the meniscus from the joint space. 2. Progressive medial compartment osteoarthritis. No acute osseous findings. 3. The lateral meniscus, cruciate and collateral ligaments are intact. 4. Small joint effusion and small Baker's cyst.   PATIENT SURVEYS:  06/19/2022 FOTO 78 (Goal met)  05/18/2022 FOTO 59 (Goal 63 in 12 visits)   COGNITION: Overall cognitive status: Within functional limits for tasks assessed                         SENSATION: No complaints of peripheral paresthesias, new tingling or numbness   EDEMA:  Noted and not objectively assessed for bilateral knees   LOWER EXTREMITY ROM:   Active ROM Right eval Left eval Rt/Left 05/26/22 Rt/left 06/03/22 Rt/Lt 06/30/2021 Left/Right 07/16/2022 Left 08/25/22  Hip flexion           Hip extension           Hip abduction           Hip adduction           Hip internal rotation           Hip external rotation           Knee flexion 124 90 122/110 122/114 134/132 136/134 134  Knee extension -2 0 0/0 0/0 0/0 0/0 0  Ankle dorsiflexion           Ankle plantarflexion           Ankle inversion           Ankle eversion            (Blank rows = not tested)   LOWER EXTREMITY STRENGTH:   Strength in pounds assessed by hand-held dynamometer Right 05/18/22 Left 05/18/22 Left/Right in pounds 06/19/22 Left/Rt 07/07/21 Left/Right 07/16/2022 Left/Right 08/06/2022  Hip flexion          Hip extension          Hip abduction          Hip adduction          Hip internal rotation          Hip  external rotation          Knee flexion          Knee extension 19.2 17.3 32.7/43.2 35.8/38.8 30.5/36.5 27.1/31.9  Ankle dorsiflexion          Ankle plantarflexion          Ankle inversion          Ankle eversion           (Blank rows = not tested)   GAIT: Distance walked: In the clinic Assistive device utilized: None (was Quad cane small base) Level of assistance: Complete Independence Comments: Rarely uses the cane (previously used the cane almost of the time)     TODAY'S TREATMENT:   08/25/22:  TherEx:  Scifit: Level 3 x 6 minutes Leg Press: bil LE's 75# x 25 Leg Press: single LE  37#  2 x 10  Lateral step ups on 8 inch step c single UE support x 20 Standing hip flexion stretch in lunge position Neuromuscular Re-edu:  Fitter fit rocker board x 2 minutes each direction c intermittent support Stepping over 6 inch hurdles (4 hurdles) x 4   08/20/2022 Bridges 2 sets of 10 5 seconds Quadriceps sets 10X 5 seconds Seated straight leg raises 4 sets  of 5 for 3 seconds with 1# weight   Functional Activities: Double Leg Press 50# 25X slow eccentrics Single Leg Press 25# Bil 15X slow eccentrics Step-down off 2 inch step 10X slow eccentrics Bil Step-up and over 4 inch step 10X slow eccentrics   08/06/2022 Bridges 2 sets of 10 5 seconds Quadriceps sets 10X 5 seconds Seated straight leg raises 3 sets of 5 for 3 seconds with 1# weight   Functional Activities: Double Leg Press 50# 15X slow eccentrics Single Leg Press 25# Bil 10X slow eccentrics        PATIENT EDUCATION:  Education details: exercise form and purpose, walking program or even consider walking in the pool to take stress off of knees   Person educated: Patient Education method: Explanation : verbalized understanding, returned demonstration, verbal cues required, tactile cues required, and needs further education   HOME EXERCISE PROGRAM: Access Code: [email or web address] URL: [email or web address] Date:  08/25/2022 Prepared by: Kearney Hard  Exercises - Supine Quadricep Sets  - 3-5 x daily - 4-5 x weekly - 2-3 sets - 10 reps - 5 second hold - Seated Knee Flexion AAROM  - 3 x daily - 1 x weekly - 1 sets - 1 reps - 3 minutes hold - Small Range Straight Leg Raise  - 3-5 x daily - 4-5 x weekly - 2 sets - 5 reps - 3 seconds hold - Yoga Bridge  - 1 x daily - 4-5 x weekly - 2-3 sets - 10 reps - 5 seconds hold - Lateral Step Down  - 1 x daily - 3 x weekly - 2-3 sets - 10 reps - Sit to Stand Without Arm Support  - 1 x daily - 7 x weekly - 2 sets - 10 reps - Standing Hip Abduction with Counter Support  - 1 x daily - 7 x weekly - 2 sets - 10 reps - Standing Hip Extension with Counter Support  - 1 x daily - 7 x weekly - 2 sets - 10 reps - Standing Hip Flexor Stretch  - 1 x daily - 7 x weekly - 3 reps - 20 seconds hold   ASSESSMENT:   CLINICAL IMPRESSION:  Pt's HEP was updated this visit. Pt reporting compliance in her previous program. Pt still encouraged to join a gym for continued improvements once skilled PT interventions are no longer needed. Pt tolerating treatment well. Pt still progressing with quadriceps strengthening. Conitnue skilled PT and prepare for discharge.     OBJECTIVE IMPAIRMENTS: Abnormal gait, decreased activity tolerance, decreased endurance, decreased knowledge of condition, difficulty walking, decreased ROM, decreased strength, increased edema, and pain.    ACTIVITY LIMITATIONS: carrying, lifting, bending, sitting, standing, squatting, stairs, bed mobility, and locomotion level   PARTICIPATION LIMITATIONS: driving, shopping, and community activity   PERSONAL FACTORS: Type 2 DM, HLD, HTN, osteoporosis are also affecting patient's functional outcome.    REHAB POTENTIAL: Good   CLINICAL DECISION MAKING: Stable/uncomplicated   EVALUATION COMPLEXITY: Low     GOALS: Goals reviewed with patient? Yes   SHORT TERM GOALS: Target date: 07/13/2022  Jaskiran will improve her  bilateral quadriceps strength to at least 30 pounds Baseline: Less than 20 pounds Goal status: MET 06/19/22   2.  Athziri will improve her left knee flexion active range of motion to at least 110 degrees Baseline: 90 degrees Goal status: MET 05/26/22   3.  Anahid will demonstrate independence with her day 1 home exercise program Baseline: Started 1120/2023 Goal status: MET 05/26/22  LONG TERM GOALS: Target date: 09/02/2021   Improve Foto to 63 Baseline: 59 Goal status: MET 06/19/2022   2.  Tailey will report bilateral knee pain consistently 0-4 out of 10 on the visual analog scale (VAS) Baseline: 3-7 out of 10 Goal status: On Going 08/20/2022   3.  Improve bilateral quadriceps strength to at least 50 pounds bilaterally Baseline: Less than 20 pounds Goal status: On Going 08/20/2022   4.  Improve bilateral knee flexion active range of motion to 125 degrees and extension to 0 degrees Baseline: 90/124 and 0/-2 Goal status: Met 06/30/2022   5.  Bernadeen will be independent with her long-term home exercise program at discharge Baseline: Started 05/18/2022 Goal status: On Going 08/20/2022   PLAN:   PT FREQUENCY: 1-2 X/week   PT DURATION: 4 weeks then DC to independent PT   PLANNED INTERVENTIONS: Therapeutic exercises, Therapeutic activity, Neuromuscular re-education, Balance training, Gait training, Patient/Family education, Self Care, Stair training, Cryotherapy, and Vasopneumatic device   PLAN FOR NEXT SESSION: Continue to increase quadriceps strength, independence with her HEP and transition Velina into independent rehabilitation. Certification runs out on 09/03/22, prepare pt for discharge next week.    Kearney Hard, PT, MPT 08/25/22 11:03 AM   08/25/22 11:03 AM

## 2022-08-26 ENCOUNTER — Encounter: Payer: Self-pay | Admitting: Nurse Practitioner

## 2022-08-26 ENCOUNTER — Ambulatory Visit: Payer: 59 | Attending: Nurse Practitioner | Admitting: Nurse Practitioner

## 2022-08-26 VITALS — BP 110/62 | HR 84 | Ht 64.0 in | Wt 161.4 lb

## 2022-08-26 DIAGNOSIS — I10 Essential (primary) hypertension: Secondary | ICD-10-CM

## 2022-08-26 DIAGNOSIS — R5383 Other fatigue: Secondary | ICD-10-CM

## 2022-08-26 DIAGNOSIS — I2510 Atherosclerotic heart disease of native coronary artery without angina pectoris: Secondary | ICD-10-CM | POA: Diagnosis not present

## 2022-08-26 DIAGNOSIS — I493 Ventricular premature depolarization: Secondary | ICD-10-CM

## 2022-08-26 DIAGNOSIS — I272 Pulmonary hypertension, unspecified: Secondary | ICD-10-CM | POA: Diagnosis not present

## 2022-08-26 NOTE — Patient Instructions (Signed)
Medication Instructions:  Your physician recommends that you continue on your current medications as directed. Please refer to the Current Medication list given to you today. *If you need a refill on your cardiac medications before your next appointment, please call your pharmacy*   Lab Work: None ordered If you have labs (blood work) drawn today and your tests are completely normal, you will receive your results only by: [place] (if you have MyChart) OR A paper copy in the mail If you have any lab test that is abnormal or we need to change your treatment, we will call you to review the results.   Testing/Procedures: None ordered   Follow-Up: At [facility], you and your health needs are our priority.  As part of our continuing mission to provide you with exceptional heart care, we have created designated Provider Care Teams.  These Care Teams include your primary Cardiologist (physician) and Advanced Practice Providers (APPs -  Physician Assistants and Nurse Practitioners) who all work together to provide you with the care you need, when you need it.  We recommend signing up for the patient portal called "MyChart".  Sign up information is provided on this After Visit Summary.  MyChart is used to connect with patients for Virtual Visits (Telemedicine).  Patients are able to view lab/test results, encounter notes, upcoming appointments, etc.  Non-urgent messages can be sent to your provider as well.   To learn more about what you can do with MyChart, go to [email or web address].    Your next appointment:   FOLLOW UP AS SCHEDULED   Provider:   Freada Bergeron, MD     Other Instructions

## 2022-08-27 ENCOUNTER — Encounter: Payer: 59 | Admitting: Rehabilitative and Restorative Service Providers"

## 2022-08-27 ENCOUNTER — Telehealth: Payer: Self-pay | Admitting: Rehabilitative and Restorative Service Providers"

## 2022-08-27 NOTE — Telephone Encounter (Signed)
Spoke with Stanton Kidney and reminded her of her next 2 appointments Tuesday and Thursday at 8:45 AM.

## 2022-08-29 NOTE — Congregational Nurse Program (Signed)
Accompanied to [employer] event.  She was enjoying event and learning a lot.  Got some good info to read.  Vinnie Langton, RN, [place] Nurse, [phone]

## 2022-08-31 ENCOUNTER — Ambulatory Visit (INDEPENDENT_AMBULATORY_CARE_PROVIDER_SITE_OTHER): Payer: 59 | Admitting: Internal Medicine

## 2022-08-31 ENCOUNTER — Encounter: Payer: Self-pay | Admitting: Internal Medicine

## 2022-08-31 VITALS — BP 120/70 | HR 85 | Ht 64.0 in | Wt 161.0 lb

## 2022-08-31 DIAGNOSIS — E113393 Type 2 diabetes mellitus with moderate nonproliferative diabetic retinopathy without macular edema, bilateral: Secondary | ICD-10-CM | POA: Diagnosis not present

## 2022-08-31 DIAGNOSIS — E1142 Type 2 diabetes mellitus with diabetic polyneuropathy: Secondary | ICD-10-CM

## 2022-08-31 DIAGNOSIS — E1159 Type 2 diabetes mellitus with other circulatory complications: Secondary | ICD-10-CM

## 2022-08-31 DIAGNOSIS — G63 Polyneuropathy in diseases classified elsewhere: Secondary | ICD-10-CM

## 2022-08-31 MED ORDER — GABAPENTIN 100 MG PO CAPS: 100.0000 mg | ORAL_CAPSULE | Freq: Every day | ORAL | 3 refills | Status: DC

## 2022-08-31 MED ORDER — SEMAGLUTIDE(0.25 OR 0.5MG/DOS) 2 MG/3ML ~~LOC~~ SOPN: 0.5000 mg | PEN_INJECTOR | SUBCUTANEOUS | 3 refills | Status: DC

## 2022-08-31 NOTE — Patient Instructions (Signed)
Continue Ozempic 0.5 mg once weekly       HOW TO TREAT LOW BLOOD SUGARS (Blood sugar LESS THAN 70 MG/DL) Please follow the RULE OF 15 for the treatment of hypoglycemia treatment (when your (blood sugars are less than 70 mg/dL)   STEP 1: Take 15 grams of carbohydrates when your blood sugar is low, which includes:  3-4 GLUCOSE TABS  OR 3-4 OZ OF JUICE OR REGULAR SODA OR ONE TUBE OF GLUCOSE GEL    STEP 2: RECHECK blood sugar in 15 MINUTES STEP 3: If your blood sugar is still low at the 15 minute recheck --> then, go back to STEP 1 and treat AGAIN with another 15 grams of carbohydrates.

## 2022-08-31 NOTE — Progress Notes (Signed)
Name: Jazsmine Rousse  Age/ Sex: 77 y.o., female   MRN/ DOB: [id number], 1946/06/09     PCP: [name], FNP   Reason for Endocrinology Evaluation: Type 2 Diabetes Mellitus  Initial Endocrine Consultative Visit: 11/25/2012    PATIENT IDENTIFIER: Ms. Tirenioluwa Telfair is a 77 y.o. female with a past medical history of T2DM, HTN, non-obstructive CAD , and Dyslipidemia . The patient has followed with Endocrinology clinic since 11/25/2012 for consultative assistance with management of her diabetes.  DIABETIC HISTORY:  Ms. Belvin was diagnosed with DM years ago, She has been on Metformin . Her hemoglobin A1c has ranged from 5.6% in 2021, peaking at 7.2% in 2015.  Invokana caused genital infections   Transitioned care from Dr. Loanne Drilling 05/2021  She was started on glimepiride in September 2023 due to hyperglycemia, but this was discontinued as it caused weight gain Started on Ozempic 03/2022    SUBJECTIVE:   During the last visit (03/2022): A1c 6.8%     Today (08/31/2022): Ms. Sweetland  is here for a follow up on diabetes management. She checks her blood sugars 1 times daily. The patient has not had hypoglycemic episodes since the last clinic visit.   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 Denies: CKD  Last Eye Exam: Completed 10/02/2021  Macrovascular complications:  Non-Obstructive CAD - follows with Cardiology  Denies: CVA, PVD   HISTORY:  Past Medical History:  Past  Medical History:  Diagnosis Date   Alkaline phosphatase deficiency    w/u Ne   Allergic rhinitis    Allergy    Anxiety    Arthritis    Cataract    BILATERAL-REMOVED   DM2 (diabetes mellitus, type 2) ([place])    GERD (gastroesophageal reflux disease)    Gout    Hemorrhoids    Hyperlipidemia    Hypertension    Mild pulmonary hypertension (HCC)    NSVT (nonsustained ventricular tachycardia) ([place])    Osteoporosis    PVC's (premature ventricular contractions)    Thyroid nodule    small   Tubular adenoma of colon 2017   Urticaria    Uterine fibroid    Past Surgical History:  Past Surgical History:  Procedure Laterality Date   CATARACT EXTRACTION Bilateral 2016   COLONOSCOPY  2007   echocardiogram (other)  01/16/2002   POLYPECTOMY     removed tumors from foot nerves  04/1999   stress cardiolite  02/12/2006   TOENAIL EXCISION     TUBAL LIGATION     TYMPANOSTOMY TUBE PLACEMENT     Social History:  reports that she quit smoking about 54 years ago. Her smoking use included cigarettes. She has a 1.25 pack-year smoking history. She has never used smokeless tobacco. She reports that she does not drink alcohol and does not use drugs. Family History:  Family History  Problem Relation Age of Onset   Heart disease Mother    Allergic rhinitis Mother    Heart disease Father  Stomach cancer Maternal Grandmother    Heart disease Maternal Grandfather    Rectal cancer Maternal Grandfather    Colon cancer Neg Hx    Breast cancer Neg Hx    Colon polyps Neg Hx    Esophageal cancer Neg Hx      HOME MEDICATIONS: Allergies as of 08/31/2022       Reactions   Olmesartan Medoxomil    REACTION: headache        Medication List        Accurate as of August 31, 2022  8:01 AM. If you have any questions, ask your nurse or doctor.          STOP taking these medications    cephALEXin 250 MG capsule Commonly known as: KEFLEX Stopped by: Dorita Sciara, MD       TAKE these  medications    alendronate 70 MG tablet Commonly known as: FOSAMAX Take 1 tablet (70 mg total) by mouth every 7 (seven) days. Take with a full glass of water on an empty stomach.   AMBULATORY NON FORMULARY MEDICATION Left wrist brace   Antacid Maximum 400 MG chewable tablet Generic drug: calcium elemental as carbonate Chew 1,000 mg by mouth as needed for heartburn.   aspirin EC 81 MG tablet Take 81 mg by mouth daily. Swallow whole.   carvedilol 12.5 MG tablet Commonly known as: COREG TAKE 1 TABLET BY MOUTH TWO TIMES A DAY WITH A MEAL   cholecalciferol 25 MCG (1000 UNIT) tablet Commonly known as: VITAMIN D3 Take 1,000 Units by mouth daily.   esomeprazole 40 MG capsule Commonly known as: NEXIUM TAKE 1 CAPSULE BY MOUTH TWICE A DAY 30 TO 60 MINUTES BEFORE YOUR FIRST AND LAST MEALS OF DAY   famotidine 20 MG tablet Commonly known as: PEPCID Take 1 tablet (20 mg total) by mouth at bedtime.   fesoterodine 4 MG Tb24 tablet Commonly known as: TOVIAZ Take 4 mg daily by mouth.   gabapentin 300 MG capsule Commonly known as: NEURONTIN TAKE ONE CAPSULE BY MOUTH EVERY NIGHT AT BEDTIME   guaiFENesin 600 MG 12 hr tablet Commonly known as: MUCINEX Take 600 mg by mouth 2 (two) times daily as needed.   HAIR SKIN AND NAILS FORMULA PO Take by mouth.   montelukast 10 MG tablet Commonly known as: SINGULAIR Take 1 tablet (10 mg total) by mouth daily.   OneTouch Verio Flex System w/Device Kit USE TO TEST BLOOD SUGAR DAILY AS DIRECTED   OneTouch Verio test strip Generic drug: glucose blood USE TO CHECK BLOOD SUGAE TWO TIMES A DAY   QC Tumeric Complex 500 MG Caps Generic drug: Turmeric Take by mouth.   rosuvastatin 20 MG tablet Commonly known as: CRESTOR TAKE ONE TABLET BY MOUTH DAILY   Semaglutide(0.25 or 0.'5MG'$ /DOS) 2 MG/3ML Sopn Inject 0.5 mg into the skin once a week.   valsartan 160 MG tablet Commonly known as: DIOVAN Take 1 tablet (160 mg total) by mouth daily.          OBJECTIVE:   Vital Signs: BP 120/70 (BP Location: Left Arm, Patient Position: Sitting, Cuff Size: Large)   Pulse 85   Ht '5\' 4"'$  (1.626 m)   Wt 161 lb (73 kg)   SpO2 96%   BMI 27.64 kg/m   Wt Readings from Last 3 Encounters:  08/31/22 161 lb (73 kg)  08/26/22 161 lb 6.4 oz (73.2 kg)  08/17/22 162 lb (73.5 kg)     Exam: General: Pt appears well  and is in NAD  Neck: General: Supple without adenopathy. Thyroid: Thyroid size normal.  No goiter or nodules appreciated.  Lungs: Clear with good BS bilat with no rales, rhonchi, or wheezes  Heart: RRR with normal S1 and S2 and no gallops; no murmurs; no rub  Abdomen: soft, nontender, without masses or organomegaly palpable  Extremities: No pretibial edema.   Neuro: MS is good with appropriate affect, pt is alert and Ox3    DM foot exam: 08/31/2022   The skin of the feet is without sores or ulcerations. The pedal pulses are 2+ on right and 2+ on left. The sensation is intact   to a screening 5.07, 10 gram monofilament on the left and absent at the left great toe           DATA REVIEWED:  Lab Results  Component Value Date   HGBA1C 5.7 07/31/2022   HGBA1C 6.8 (H) 03/03/2022   HGBA1C 5.9 09/16/2021    Latest Reference Range & Units 08/07/22 13:30  Sodium 135 - 145 mEq/L 140  Potassium 3.5 - 5.1 mEq/L 4.2  Chloride 96 - 112 mEq/L 102  CO2 19 - 32 mEq/L 28  Glucose 70 - 99 mg/dL 89  BUN 6 - 23 mg/dL 12  Creatinine 0.40 - 1.20 mg/dL 0.81  Calcium 8.4 - 10.5 mg/dL 9.0  Magnesium 1.5 - 2.5 mg/dL 1.9  Alkaline Phosphatase 39 - 117 U/L 56  Albumin 3.5 - 5.2 g/dL 4.0  AST 0 - 37 U/L 17  ALT 0 - 35 U/L 15  Total Protein 6.0 - 8.3 g/dL 6.9  Total Bilirubin 0.2 - 1.2 mg/dL 0.4  GFR >60.00 mL/min 70.21    ASSESSMENT / PLAN / RECOMMENDATIONS:   1) Type 2 Diabetes Mellitus, Optimally controlled, With Retinopathic, neuropathic and macrovascular complications - Most recent A1c of 5.7 %. Goal A1c < 7.0 %.    - A1c remains at  goal.  - She is not interested in restarting Metformin  - Glimepiride caused weight gain - She is satisfied with Ozempic - No changes     MEDICATIONS: Continue Ozempic  0.5 milligram weekly  EDUCATION / INSTRUCTIONS: BG monitoring instructions: Patient is instructed to check her blood sugars 1 times a day    2) Diabetic complications:  Eye: Does  have known diabetic retinopathy.  Neuro/ Feet: Does  have known diabetic peripheral neuropathy .  Renal: Patient does not have known baseline CKD. She   is  on an ACEI/ARB at present.   3) Peripheral Neuropathy   - Will Decrease Gabapentin due to daytime excessive sleepiness   Medication  Stop Gabapentin 300 mg  Start Gabapentin 100 mg daily     F/U in 6 months   Signed electronically by: Mack Guise, MD  [facility] Endocrinology  [facility] [address] [place] [address] Phone: [phone] FAX: [phone]   CC: [name], [place] [address] [place] [address] Phone: [phone]  Fax: [phone]  Return to Endocrinology clinic as below: Future Appointments  Date Time Provider [place]  09/01/2022  8:45 AM Oretha Caprice, PT [facility] None  09/03/2022  8:45 AM Maricela Bo, PT [facility] None  09/08/2022  9:30 AM Oretha Caprice, PT [facility] None  09/10/2022  9:30 AM Maricela Bo, PT [facility] None  10/06/2022  8:00 AM Zadie Rhine Clent Demark, MD [facility] None  10/26/2022  8:40 AM Freada Bergeron, MD [facility]  02/05/2023  8:20 AM [name], FNP [facility]

## 2022-09-01 ENCOUNTER — Encounter: Payer: Self-pay | Admitting: Physical Therapy

## 2022-09-01 ENCOUNTER — Ambulatory Visit (INDEPENDENT_AMBULATORY_CARE_PROVIDER_SITE_OTHER): Payer: 59 | Admitting: Physical Therapy

## 2022-09-01 DIAGNOSIS — M25561 Pain in right knee: Secondary | ICD-10-CM | POA: Diagnosis not present

## 2022-09-01 DIAGNOSIS — M25662 Stiffness of left knee, not elsewhere classified: Secondary | ICD-10-CM | POA: Diagnosis not present

## 2022-09-01 DIAGNOSIS — M25661 Stiffness of right knee, not elsewhere classified: Secondary | ICD-10-CM | POA: Diagnosis not present

## 2022-09-01 DIAGNOSIS — R262 Difficulty in walking, not elsewhere classified: Secondary | ICD-10-CM

## 2022-09-01 DIAGNOSIS — M25562 Pain in left knee: Secondary | ICD-10-CM | POA: Diagnosis not present

## 2022-09-01 DIAGNOSIS — R600 Localized edema: Secondary | ICD-10-CM | POA: Diagnosis not present

## 2022-09-01 NOTE — Therapy (Signed)
OUTPATIENT PHYSICAL THERAPY TREATMENT NOTE RE-cert/ Progress Note   Patient Name: Amy Escobar MRN: [id number] DOB:02/10/1946, 77 y.o., female Today's Date: 09/01/2022  PCP: [name], FNP  REFERRING PROVIDER: Mcarthur Rossetti, MD   Progress Note Reporting Period 07/09/22 to 09/01/22  See note below for Objective Data and Assessment of Progress/Goals.      END OF SESSION:   PT End of Session - 09/01/22 0911     Visit Number 18    Number of Visits 27    Date for PT Re-Evaluation 10/02/22    Authorization Type Recert sent on [id number] for 4 more weeks until 10/02/22    Progress Note Due on Visit 35    PT Start Time 0845    PT Stop Time 0925    PT Time Calculation (min) 40 min    Activity Tolerance Patient tolerated treatment well;Patient limited by fatigue;No increased pain    Behavior During Therapy WFL for tasks assessed/performed              Past Medical History:  Diagnosis Date   Alkaline phosphatase deficiency    w/u Ne   Allergic rhinitis    Allergy    Anxiety    Arthritis    Cataract    BILATERAL-REMOVED   DM2 (diabetes mellitus, type 2) (HCC)    GERD (gastroesophageal reflux disease)    Gout    Hemorrhoids    Hyperlipidemia    Hypertension    Mild pulmonary hypertension (HCC)    NSVT (nonsustained ventricular tachycardia) (HCC)    Osteoporosis    PVC's (premature ventricular contractions)    Thyroid nodule    small   Tubular adenoma of colon 2017   Urticaria    Uterine fibroid    Past Surgical History:  Procedure Laterality Date   CATARACT EXTRACTION Bilateral 2016   COLONOSCOPY  2007   echocardiogram (other)  01/16/2002   POLYPECTOMY     removed tumors from foot nerves  04/1999   stress cardiolite  02/12/2006   TOENAIL EXCISION     TUBAL LIGATION     TYMPANOSTOMY TUBE PLACEMENT     Patient Active Problem List   Diagnosis Date Noted   Polyneuropathy associated with underlying disease ([place]) 08/31/2022   Type 2  diabetes mellitus with diabetic polyneuropathy, without long-term current use of insulin ([place]) 06/18/2021   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 without macular edema, without long-term current use of insulin ([place]) 06/18/2021   Diabetes mellitus ([place]) 06/18/2021   Osteoarthritis of right AC (acromioclavicular) joint 03/06/2021   Osteoarthritis of right glenohumeral joint 03/06/2021   Foot pain, bilateral 12/26/2020   Diabetic neuropathy ([place]) 01/10/2020   Hav (hallux abducto valgus), unspecified laterality 01/10/2020   Chronic arthropathy 01/10/2020   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 edema associated with type 2 diabetes mellitus ([place]) 12/26/2019   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   AC (acromioclavicular) arthritis 08/02/2019   Unspecified inflammatory spondylopathy, 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[place]) 04/19/2019   Claudication ([place]) 04/19/2019   Morbid obesity ([place]) 04/19/2019   Suspected COVID-19 virus infection 04/13/2019   Upper airway cough syndrome 08/22/2018   Greater trochanteric bursitis of both hips 06/15/2018   Trigger point of shoulder region, left 02/07/2018   Hypertension    Mild pulmonary hypertension (HCC)    NSVT (nonsustained ventricular tachycardia) (HCC)    PVC's (premature ventricular contractions)    URI (upper respiratory infection) 08/09/2016  Neck pain 02/10/2016   Urinary incontinence 02/10/2016   Degenerative arthritis of knee, bilateral 07/17/2015   Knee MCL sprain 06/07/2015   Discomfort in chest 02/15/2015   Chest pain 02/15/2015   DM type 2, controlled, with complication ([place])    Wellness examination 08/06/2014   Acute meniscal tear of knee 02/13/2014   Primary localized osteoarthrosis, lower leg 02/13/2014   Gastrocnemius tear 12/25/2013   UTI (urinary tract infection) 12/20/2013   Right knee pain 12/20/2013   Pulmonary hypertension ([place]) 10/06/2013    DOE (dyspnea on exertion) 08/04/2013   Dysphagia, unspecified(787.20) 08/10/2012   Hip pain, left 02/02/2012   Painful respiration 05/25/2011   Encounter for long-term (current) use of other medications 01/30/2011   PELVIC PAIN, LEFT 07/30/2010   TOBACCO USE, QUIT 07/07/2010   FATIGUE 12/20/2009   Headache 12/20/2009   Low back pain 12/26/2008   Disorder of liver 04/13/2008   FOOT PAIN, BILATERAL 04/13/2008   FIBROIDS, UTERUS 11/24/2007   THYROID NODULE, LEFT 11/24/2007   HYPERCHOLESTEROLEMIA 11/24/2007   CARPAL TUNNEL SYNDROME, BILATERAL 11/24/2007   ALKALINE PHOSPHATASE, ELEVATED 11/24/2007   Gout 08/10/2007   ANXIETY 08/10/2007   Essential hypertension 08/10/2007   Cough 08/01/2007   Perennial and seasonal allergic rhinitis 01/14/2007   GERD 01/14/2007   Osteoporosis 01/14/2007    REFERRING DIAG:  M25.562,G89.29 (ICD-10-CM) - Chronic pain of left knee  M25.561,G89.29 (ICD-10-CM) - Chronic pain of right knee  S83.232D (ICD-10-CM) - Complex tear of medial meniscus of left knee as current injury, subsequent encounter    THERAPY DIAG:  Difficulty in walking, not elsewhere classified  Localized edema  Stiffness of left knee, not elsewhere classified  Stiffness of right knee, not elsewhere classified  Pain in both knees, unspecified chronicity  Rationale for Evaluation and Treatment Rehabilitation  PERTINENT HISTORY: Type 2 DM, HLD, HTN, osteoporosis   PRECAUTIONS: none  SUBJECTIVE:                                                                                                                                                                                      SUBJECTIVE STATEMENT:   Pt arriving to therapy reporting 9/10 pain on her left knee. Pt still reporting LOB where she is able to catch herself in a community setting. Pt feels she is not ready to for discharge this week.   PAIN:  Are you having pain? 9/10 in left knee Description: Ache Location: Bil  knees Aggravating factors: Prolonged postures, rain, cold weather Relieving factors: Ice, exercise, movement    OBJECTIVE: (objective measures completed at initial evaluation unless otherwise dated) DIAGNOSTIC FINDINGS:    Left knee IMPRESSION: 1. Root tear of the  posterior horn of the medial meniscus with resulting mild peripheral extrusion of the meniscus from the joint space. 2. Mild medial and patellofemoral compartment degenerative chondrosis. No acute osseous findings. 3. Intact lateral meniscus, cruciate and collateral ligaments. 4. Heterogeneous, serpiginous T2 hyperintensity centrally and laterally in Hoffa's fat may reflect a venous malformation or atypical ganglion.   Right knee IMPRESSION: 1. Chronic root tear of the posterior horn of the medial meniscus with progressive peripheral extrusion of the meniscus from the joint space. 2. Progressive medial compartment osteoarthritis. No acute osseous findings. 3. The lateral meniscus, cruciate and collateral ligaments are intact. 4. Small joint effusion and small Baker's cyst.   PATIENT SURVEYS:  06/19/2022 FOTO 78 (Goal met)  05/18/2022 FOTO 59 (Goal 63 in 12 visits)   COGNITION: Overall cognitive status: Within functional limits for tasks assessed                         SENSATION: No complaints of peripheral paresthesias, new tingling or numbness   EDEMA:  Noted and not objectively assessed for bilateral knees   LOWER EXTREMITY ROM:   Active ROM Right eval Left eval Rt/Left 05/26/22 Rt/left 06/03/22 Rt/Lt 06/30/2021 Left/Right 07/16/2022 Left 08/25/22 Left 09/01/22  Hip flexion            Hip extension            Hip abduction            Hip adduction            Hip internal rotation            Hip external rotation            Knee flexion 124 90 122/110 122/114 134/132 136/134 134 134  Knee extension -2 0 0/0 0/0 0/0 0/0 0 0  Ankle dorsiflexion            Ankle plantarflexion            Ankle  inversion            Ankle eversion             (Blank rows = not tested)   LOWER EXTREMITY STRENGTH:   Strength in pounds assessed by hand-held dynamometer Right 05/18/22 Left 05/18/22 Left/Right in pounds 06/19/22 Left/Rt 07/07/21 Left/Right 07/16/2022 Left/Right 08/06/2022 Left/Rt 09/01/22  Hip flexion           Hip extension           Hip abduction           Hip adduction           Hip internal rotation           Hip external rotation           Knee flexion           Knee extension 19.2 17.3 32.7/43.2 35.8/38.8 30.5/36.5 27.1/31.9 32.2/42.3  Ankle dorsiflexion           Ankle plantarflexion           Ankle inversion           Ankle eversion            (Blank rows = not tested)   GAIT: Distance walked: In the clinic Assistive device utilized: None (was Quad cane small base) Level of assistance: Complete Independence Comments: Rarely uses the cane (previously used the cane almost of the time)     TODAY'S TREATMENT:  08/31/22:  TherEx:  Nustep: level 5 x 7 minutes  Leg Press: bil LE's 87# x 25 Leg Press: single LE  37#  2 x 10  Sit to stand x 15 from chair using no UE support Neuromuscular Re-edu:  Up and down 1 flight of stairs with single hand rail Stepping over objects lying on floor  08/25/22:  TherEx:  Scifit: Level 3 x 6 minutes Leg Press: bil LE's 75# x 25 Leg Press: single LE  37#  2 x 10  Lateral step ups on 8 inch step c single UE support x 20 Standing hip flexion stretch in lunge position Neuromuscular Re-edu:  Fitter fit rocker board x 2 minutes each direction c intermittent support Stepping over 6 inch hurdles (4 hurdles) x 4   08/20/2022 Bridges 2 sets of 10 5 seconds Quadriceps sets 10X 5 seconds Seated straight leg raises 4 sets of 5 for 3 seconds with 1# weight   Functional Activities: Double Leg Press 50# 25X slow eccentrics Single Leg Press 25# Bil 15X slow eccentrics Step-down off 2 inch step 10X slow eccentrics Bil Step-up and over 4 inch step  10X slow eccentrics   08/06/2022 Bridges 2 sets of 10 5 seconds Quadriceps sets 10X 5 seconds Seated straight leg raises 3 sets of 5 for 3 seconds with 1# weight   Functional Activities: Double Leg Press 50# 15X slow eccentrics Single Leg Press 25# Bil 10X slow eccentrics        PATIENT EDUCATION:  Education details: exercise form and purpose, walking program or even consider walking in the pool to take stress off of knees   Person educated: Patient Education method: Explanation : verbalized understanding, returned demonstration, verbal cues required, tactile cues required, and needs further education   HOME EXERCISE PROGRAM: Access Code: [email or web address] URL: [email or web address] Date: 08/25/2022 Prepared by: Kearney Hard  Exercises - Supine Quadricep Sets  - 3-5 x daily - 4-5 x weekly - 2-3 sets - 10 reps - 5 second hold - Seated Knee Flexion AAROM  - 3 x daily - 1 x weekly - 1 sets - 1 reps - 3 minutes hold - Small Range Straight Leg Raise  - 3-5 x daily - 4-5 x weekly - 2 sets - 5 reps - 3 seconds hold - Yoga Bridge  - 1 x daily - 4-5 x weekly - 2-3 sets - 10 reps - 5 seconds hold - Lateral Step Down  - 1 x daily - 3 x weekly - 2-3 sets - 10 reps - Sit to Stand Without Arm Support  - 1 x daily - 7 x weekly - 2 sets - 10 reps - Standing Hip Abduction with Counter Support  - 1 x daily - 7 x weekly - 2 sets - 10 reps - Standing Hip Extension with Counter Support  - 1 x daily - 7 x weekly - 2 sets - 10 reps - Standing Hip Flexor Stretch  - 1 x daily - 7 x weekly - 3 reps - 20 seconds hold   ASSESSMENT:   CLINICAL IMPRESSION:  Pt is still limited by pain and with independent mobility using no device in community setting. I am requesting 1-2x/ week for an additional 4 weeks. Pt is still progressing toward her LTG's set at initial evaluation.     OBJECTIVE IMPAIRMENTS: Abnormal gait, decreased activity tolerance, decreased endurance, decreased knowledge of  condition, difficulty walking, decreased ROM, decreased strength, increased edema, and pain.    ACTIVITY LIMITATIONS: carrying, lifting, bending, sitting,  standing, squatting, stairs, bed mobility, and locomotion level   PARTICIPATION LIMITATIONS: driving, shopping, and community activity   PERSONAL FACTORS: Type 2 DM, HLD, HTN, osteoporosis are also affecting patient's functional outcome.    REHAB POTENTIAL: Good   CLINICAL DECISION MAKING: Stable/uncomplicated   EVALUATION COMPLEXITY: Low     GOALS: Goals reviewed with patient? Yes   SHORT TERM GOALS: Target date: 07/13/2022  Chloris will improve her bilateral quadriceps strength to at least 30 pounds Baseline: Less than 20 pounds Goal status: MET 06/19/22   2.  Clotee will improve her left knee flexion active range of motion to at least 110 degrees Baseline: 90 degrees Goal status: MET 05/26/22   3.  Rudean will demonstrate independence with her day 1 home exercise program Baseline: Started 1120/2023 Goal status: MET 05/26/22       LONG TERM GOALS: Target date: 10/02/22   Improve Foto to 63 Baseline: 59 Goal status: MET 06/19/2022   2.  Jillien will report bilateral knee pain consistently 0-4 out of 10 on the visual analog scale (VAS) Baseline: 3-7 out of 10 Goal status: On Going 09/01/2022   3.  Improve bilateral quadriceps strength to at least 50 pounds bilaterally Baseline: Less than 20 pounds Goal status: On Going 09/01/22   4.  Improve bilateral knee flexion active range of motion to 125 degrees and extension to 0 degrees Baseline: 90/124 and 0/-2 Goal status: Met 06/30/2022   5.  Corneshia will be independent with her long-term home exercise program at discharge Baseline: Started 05/18/2022 Goal status: On Going 09/01/2022   PLAN:   PT FREQUENCY: 1-2x/ week   PT DURATION: 4 weeks then DC to independent PT (10/02/22)   PLANNED INTERVENTIONS: Therapeutic exercises, Therapeutic activity, Neuromuscular re-education, Balance  training, Gait training, Patient/Family education, Self Care, Stair training, Cryotherapy, and Vasopneumatic device   PLAN FOR NEXT SESSION: Continue to increase quadriceps strength, independence with her HEP and transition Daron into independent rehabilitation. Recert on [id number]   Kearney Hard, PT, MPT 09/01/22 9:25 AM   09/01/22 9:25 AM

## 2022-09-03 ENCOUNTER — Ambulatory Visit (INDEPENDENT_AMBULATORY_CARE_PROVIDER_SITE_OTHER): Payer: 59 | Admitting: Rehabilitative and Restorative Service Providers"

## 2022-09-03 ENCOUNTER — Encounter: Payer: Self-pay | Admitting: Rehabilitative and Restorative Service Providers"

## 2022-09-03 DIAGNOSIS — M25661 Stiffness of right knee, not elsewhere classified: Secondary | ICD-10-CM | POA: Diagnosis not present

## 2022-09-03 DIAGNOSIS — R600 Localized edema: Secondary | ICD-10-CM | POA: Diagnosis not present

## 2022-09-03 DIAGNOSIS — M25561 Pain in right knee: Secondary | ICD-10-CM | POA: Diagnosis not present

## 2022-09-03 DIAGNOSIS — R262 Difficulty in walking, not elsewhere classified: Secondary | ICD-10-CM

## 2022-09-03 DIAGNOSIS — M25662 Stiffness of left knee, not elsewhere classified: Secondary | ICD-10-CM | POA: Diagnosis not present

## 2022-09-03 DIAGNOSIS — M25562 Pain in left knee: Secondary | ICD-10-CM

## 2022-09-03 NOTE — Therapy (Signed)
OUTPATIENT PHYSICAL THERAPY TREATMENT NOTE   Patient Name: Amy Escobar MRN: [id number] DOB:1945/07/15, 77 y.o., female Today's Date: 09/03/2022  PCP: [name], FNP  REFERRING PROVIDER: Mcarthur Rossetti, MD    END OF SESSION:   PT End of Session - 09/03/22 0900     Visit Number 19    Number of Visits 27    Date for PT Re-Evaluation 10/02/22    Authorization Type Recert sent on [id number] for 4 more weeks until 10/02/22    Progress Note Due on Visit 78    PT Start Time 0856    PT Stop Time 0925    PT Time Calculation (min) 29 min    Activity Tolerance Patient tolerated treatment well;Patient limited by fatigue;No increased pain    Behavior During Therapy WFL for tasks assessed/performed             Past Medical History:  Diagnosis Date   Alkaline phosphatase deficiency    w/u Ne   Allergic rhinitis    Allergy    Anxiety    Arthritis    Cataract    BILATERAL-REMOVED   DM2 (diabetes mellitus, type 2) (HCC)    GERD (gastroesophageal reflux disease)    Gout    Hemorrhoids    Hyperlipidemia    Hypertension    Mild pulmonary hypertension (HCC)    NSVT (nonsustained ventricular tachycardia) (HCC)    Osteoporosis    PVC's (premature ventricular contractions)    Thyroid nodule    small   Tubular adenoma of colon 2017   Urticaria    Uterine fibroid    Past Surgical History:  Procedure Laterality Date   CATARACT EXTRACTION Bilateral 2016   COLONOSCOPY  2007   echocardiogram (other)  01/16/2002   POLYPECTOMY     removed tumors from foot nerves  04/1999   stress cardiolite  02/12/2006   TOENAIL EXCISION     TUBAL LIGATION     TYMPANOSTOMY TUBE PLACEMENT     Patient Active Problem List   Diagnosis Date Noted   Polyneuropathy associated with underlying disease ([place]) 08/31/2022   Type 2 diabetes mellitus with diabetic polyneuropathy, without long-term current use of insulin ([place]) 06/18/2021   Type 2 diabetes mellitus with both eyes affected by  moderate nonproliferative retinopathy without macular edema, without long-term current use of insulin ([place]) 06/18/2021   Diabetes mellitus ([place]) 06/18/2021   Osteoarthritis of right AC (acromioclavicular) joint 03/06/2021   Osteoarthritis of right glenohumeral joint 03/06/2021   Foot pain, bilateral 12/26/2020   Diabetic neuropathy ([place]) 01/10/2020   Hav (hallux abducto valgus), unspecified laterality 01/10/2020   Chronic arthropathy 01/10/2020   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 edema associated with type 2 diabetes mellitus ([place]) 12/26/2019   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   AC (acromioclavicular) arthritis 08/02/2019   Unspecified inflammatory spondylopathy, 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[place]) 04/19/2019   Claudication ([place]) 04/19/2019   Morbid obesity ([place]) 04/19/2019   Suspected COVID-19 virus infection 04/13/2019   Upper airway cough syndrome 08/22/2018   Greater trochanteric bursitis of both hips 06/15/2018   Trigger point of shoulder region, left 02/07/2018   Hypertension    Mild pulmonary hypertension (HCC)    NSVT (nonsustained ventricular tachycardia) (HCC)    PVC's (premature ventricular contractions)    URI (upper respiratory infection) 08/09/2016   Neck pain 02/10/2016   Urinary incontinence 02/10/2016   Degenerative arthritis of knee, bilateral 07/17/2015   Knee MCL sprain 06/07/2015   Discomfort in  chest 02/15/2015   Chest pain 02/15/2015   DM type 2, controlled, with complication ([place])    Wellness examination 08/06/2014   Acute meniscal tear of knee 02/13/2014   Primary localized osteoarthrosis, lower leg 02/13/2014   Gastrocnemius tear 12/25/2013   UTI (urinary tract infection) 12/20/2013   Right knee pain 12/20/2013   Pulmonary hypertension ([place]) 10/06/2013   DOE (dyspnea on exertion) 08/04/2013   Dysphagia, unspecified(787.20) 08/10/2012   Hip pain, left 02/02/2012   Painful respiration 05/25/2011   Encounter  for long-term (current) use of other medications 01/30/2011   PELVIC PAIN, LEFT 07/30/2010   TOBACCO USE, QUIT 07/07/2010   FATIGUE 12/20/2009   Headache 12/20/2009   Low back pain 12/26/2008   Disorder of liver 04/13/2008   FOOT PAIN, BILATERAL 04/13/2008   FIBROIDS, UTERUS 11/24/2007   THYROID NODULE, LEFT 11/24/2007   HYPERCHOLESTEROLEMIA 11/24/2007   CARPAL TUNNEL SYNDROME, BILATERAL 11/24/2007   ALKALINE PHOSPHATASE, ELEVATED 11/24/2007   Gout 08/10/2007   ANXIETY 08/10/2007   Essential hypertension 08/10/2007   Cough 08/01/2007   Perennial and seasonal allergic rhinitis 01/14/2007   GERD 01/14/2007   Osteoporosis 01/14/2007    REFERRING DIAG:  M25.562,G89.29 (ICD-10-CM) - Chronic pain of left knee  M25.561,G89.29 (ICD-10-CM) - Chronic pain of right knee  S83.232D (ICD-10-CM) - Complex tear of medial meniscus of left knee as current injury, subsequent encounter    THERAPY DIAG:  Difficulty in walking, not elsewhere classified  Localized edema  Stiffness of left knee, not elsewhere classified  Stiffness of right knee, not elsewhere classified  Pain in both knees, unspecified chronicity  Rationale for Evaluation and Treatment Rehabilitation  PERTINENT HISTORY: Type 2 DM, HLD, HTN, osteoporosis   PRECAUTIONS: none  SUBJECTIVE:                                                                                                                                                                                      SUBJECTIVE STATEMENT:   [name] reports consistent HEP compliance.  She is pleased with her current program and is aware transition into independent PT is expected by 10/02/2022.  PAIN:  Are you having pain? At worst 6/10 right and 8/10 left knee over the past few days Description: Ache Location: Bil knees Aggravating factors: Prolonged postures, rain, cold weather Relieving factors: Ice, exercise, movement    OBJECTIVE: (objective measures completed at initial  evaluation unless otherwise dated) DIAGNOSTIC FINDINGS:    Left knee IMPRESSION: 1. Root tear of the posterior horn of the medial meniscus with resulting mild peripheral extrusion of the meniscus from the joint space. 2. Mild medial and patellofemoral compartment degenerative chondrosis. No acute osseous findings. 3.  Intact lateral meniscus, cruciate and collateral ligaments. 4. Heterogeneous, serpiginous T2 hyperintensity centrally and laterally in Hoffa's fat may reflect a venous malformation or atypical ganglion.   Right knee IMPRESSION: 1. Chronic root tear of the posterior horn of the medial meniscus with progressive peripheral extrusion of the meniscus from the joint space. 2. Progressive medial compartment osteoarthritis. No acute osseous findings. 3. The lateral meniscus, cruciate and collateral ligaments are intact. 4. Small joint effusion and small Baker's cyst.   PATIENT SURVEYS:  06/19/2022 FOTO 78 (Goal met)  05/18/2022 FOTO 59 (Goal 63 in 12 visits)   COGNITION: Overall cognitive status: Within functional limits for tasks assessed                         SENSATION: No complaints of peripheral paresthesias, new tingling or numbness   EDEMA:  Noted and not objectively assessed for bilateral knees   LOWER EXTREMITY ROM:   Active ROM Right eval Left eval Rt/Left 05/26/22 Rt/left 06/03/22 Rt/Lt 06/30/2021 Left/Right 07/16/2022 Left 08/25/22 Left 09/01/22  Hip flexion            Hip extension            Hip abduction            Hip adduction            Hip internal rotation            Hip external rotation            Knee flexion 124 90 122/110 122/114 134/132 136/134 134 134  Knee extension -2 0 0/0 0/0 0/0 0/0 0 0  Ankle dorsiflexion            Ankle plantarflexion            Ankle inversion            Ankle eversion             (Blank rows = not tested)   LOWER EXTREMITY STRENGTH:   Strength in pounds assessed by hand-held dynamometer  Right 05/18/22 Left 05/18/22 Left/Right in pounds 06/19/22 Left/Rt 07/07/21 Left/Right 07/16/2022 Left/Right 08/06/2022 Left/Rt 09/01/22  Hip flexion           Hip extension           Hip abduction           Hip adduction           Hip internal rotation           Hip external rotation           Knee flexion           Knee extension 19.2 17.3 32.7/43.2 35.8/38.8 30.5/36.5 27.1/31.9 32.2/42.3  Ankle dorsiflexion           Ankle plantarflexion           Ankle inversion           Ankle eversion            (Blank rows = not tested)   GAIT: Distance walked: In the clinic Assistive device utilized: None (was Quad cane small base) Level of assistance: Complete Independence Comments: Rarely uses the cane (previously used the cane almost of the time)     TODAY'S TREATMENT:  09/03/22: Verbally reviewed HEP emphasis of Bridges 2 sets of 10 5 seconds; Quadriceps sets 10X 5 seconds; Seated straight leg raises 4 sets of 5 for 3 seconds with 1# weight  NuStep Level 5 for 8 minutes   Functional Activities: Double Leg Press 75# 25X slow eccentrics Single Leg Press 31# Bil 15X slow eccentrics Step-down off 2 inch step 10X slow eccentrics Bil Step-up and over 4 inch step 10X slow eccentrics Reviewed the importance of slow eccentrics with sit to stand and stairs for strength and functional progressions   08/31/22:  TherEx:  Nustep: level 5 x 7 minutes  Leg Press: bil LE's 87# x 25 Leg Press: single LE  37#  2 x 10  Sit to stand x 15 from chair using no UE support Neuromuscular Re-edu:  Up and down 1 flight of stairs with single hand rail Stepping over objects lying on floor   08/25/22:  TherEx:  Scifit: Level 3 x 6 minutes Leg Press: bil LE's 75# x 25 Leg Press: single LE  37#  2 x 10  Lateral step ups on 8 inch step c single UE support x 20 Standing hip flexion stretch in lunge position Neuromuscular Re-edu:  Fitter fit rocker board x 2 minutes each direction c intermittent support Stepping  over 6 inch hurdles (4 hurdles) x 4     PATIENT EDUCATION:  Education details: exercise form and purpose, walking program or even consider walking in the pool to take stress off of knees   Person educated: Patient Education method: Explanation : verbalized understanding, returned demonstration, verbal cues required, tactile cues required, and needs further education   HOME EXERCISE PROGRAM: Access Code: [email or web address] URL: [email or web address] Date: 08/25/2022 Prepared by: Kearney Hard  Exercises - Supine Quadricep Sets  - 3-5 x daily - 4-5 x weekly - 2-3 sets - 10 reps - 5 second hold - Seated Knee Flexion AAROM  - 3 x daily - 1 x weekly - 1 sets - 1 reps - 3 minutes hold - Small Range Straight Leg Raise  - 3-5 x daily - 4-5 x weekly - 2 sets - 5 reps - 3 seconds hold - Yoga Bridge  - 1 x daily - 4-5 x weekly - 2-3 sets - 10 reps - 5 seconds hold - Lateral Step Down  - 1 x daily - 3 x weekly - 2-3 sets - 10 reps - Sit to Stand Without Arm Support  - 1 x daily - 7 x weekly - 2 sets - 10 reps - Standing Hip Abduction with Counter Support  - 1 x daily - 7 x weekly - 2 sets - 10 reps - Standing Hip Extension with Counter Support  - 1 x daily - 7 x weekly - 2 sets - 10 reps - Standing Hip Flexor Stretch  - 1 x daily - 7 x weekly - 3 reps - 20 seconds hold   ASSESSMENT:   CLINICAL IMPRESSION:  Started almost 15 minutes late due to Chaunice's late arrival.  Jenyfer reports good compliance with her HEP, particularly seated straight leg raises and sit to stand.  Jamieann knows she is working on transition into independent rehabilitation.  Either a gym or with her HEP.  Continue functional proximal thigh strength emphasis to meet long-term goals.  OBJECTIVE IMPAIRMENTS: Abnormal gait, decreased activity tolerance, decreased endurance, decreased knowledge of condition, difficulty walking, decreased ROM, decreased strength, increased edema, and pain.    ACTIVITY LIMITATIONS: carrying,  lifting, bending, sitting, standing, squatting, stairs, bed mobility, and locomotion level   PARTICIPATION LIMITATIONS: driving, shopping, and community activity   PERSONAL FACTORS: Type 2 DM, HLD, HTN, osteoporosis are also affecting patient's functional outcome.  REHAB POTENTIAL: Good   CLINICAL DECISION MAKING: Stable/uncomplicated   EVALUATION COMPLEXITY: Low     GOALS: Goals reviewed with patient? Yes   SHORT TERM GOALS: Target date: 07/13/2022  Jhanvi will improve her bilateral quadriceps strength to at least 30 pounds Baseline: Less than 20 pounds Goal status: MET 06/19/22   2.  Seniya will improve her left knee flexion active range of motion to at least 110 degrees Baseline: 90 degrees Goal status: MET 05/26/22   3.  Arrayah will demonstrate independence with her day 1 home exercise program Baseline: Started 1120/2023 Goal status: MET 05/26/22       LONG TERM GOALS: Target date: 10/02/22   Improve Foto to 63 Baseline: 59 Goal status: MET 06/19/2022   2.  Deleta will report bilateral knee pain consistently 0-4 out of 10 on the visual analog scale (VAS) Baseline: 3-7 out of 10 Goal status: On Going 09/03/2022   3.  Improve bilateral quadriceps strength to at least 50 pounds bilaterally Baseline: Less than 20 pounds Goal status: On Going 09/01/22   4.  Improve bilateral knee flexion active range of motion to 125 degrees and extension to 0 degrees Baseline: 90/124 and 0/-2 Goal status: Met 06/30/2022   5.  Iliyah will be independent with her long-term home exercise program at discharge Baseline: Started 05/18/2022 Goal status: On Going 09/03/2022   PLAN:   PT FREQUENCY: 1-2x/ week   PT DURATION: 4 weeks then DC to independent PT (10/02/22)   PLANNED INTERVENTIONS: Therapeutic exercises, Therapeutic activity, Neuromuscular re-education, Balance training, Gait training, Patient/Family education, Self Care, Stair training, Cryotherapy, and Vasopneumatic device   PLAN FOR NEXT  SESSION: Continue to increase quadriceps strength, independence with her HEP and transition [place] into independent rehabilitation. Recert on [id number]   Farley Ly PT, MPT 09/03/22 9:26 AM   09/03/22 9:26 AM

## 2022-09-08 ENCOUNTER — Ambulatory Visit (INDEPENDENT_AMBULATORY_CARE_PROVIDER_SITE_OTHER): Payer: 59 | Admitting: Physical Therapy

## 2022-09-08 ENCOUNTER — Encounter: Payer: Self-pay | Admitting: Physical Therapy

## 2022-09-08 DIAGNOSIS — R600 Localized edema: Secondary | ICD-10-CM

## 2022-09-08 DIAGNOSIS — M25562 Pain in left knee: Secondary | ICD-10-CM | POA: Diagnosis not present

## 2022-09-08 DIAGNOSIS — R262 Difficulty in walking, not elsewhere classified: Secondary | ICD-10-CM

## 2022-09-08 DIAGNOSIS — M25662 Stiffness of left knee, not elsewhere classified: Secondary | ICD-10-CM

## 2022-09-08 DIAGNOSIS — M25661 Stiffness of right knee, not elsewhere classified: Secondary | ICD-10-CM

## 2022-09-08 DIAGNOSIS — M25561 Pain in right knee: Secondary | ICD-10-CM | POA: Diagnosis not present

## 2022-09-08 NOTE — Therapy (Signed)
OUTPATIENT PHYSICAL THERAPY TREATMENT NOTE   Patient Name: Amy Escobar MRN: [id number] DOB:July 13, 1945, 77 y.o., female Today's Date: 09/08/2022  PCP: [name], FNP  REFERRING PROVIDER: Mcarthur Rossetti, MD    END OF SESSION:   PT End of Session - 09/08/22 0941     Visit Number 20    Number of Visits 27    Date for PT Re-Evaluation 10/02/22    Authorization Type Recert sent on [id number] for 4 more weeks until 10/02/22    Progress Note Due on Visit 53    PT Start Time 0935    PT Stop Time 1015    PT Time Calculation (min) 40 min    Activity Tolerance Patient tolerated treatment well;Patient limited by fatigue;No increased pain    Behavior During Therapy WFL for tasks assessed/performed             Past Medical History:  Diagnosis Date   Alkaline phosphatase deficiency    w/u Ne   Allergic rhinitis    Allergy    Anxiety    Arthritis    Cataract    BILATERAL-REMOVED   DM2 (diabetes mellitus, type 2) (HCC)    GERD (gastroesophageal reflux disease)    Gout    Hemorrhoids    Hyperlipidemia    Hypertension    Mild pulmonary hypertension (HCC)    NSVT (nonsustained ventricular tachycardia) (HCC)    Osteoporosis    PVC's (premature ventricular contractions)    Thyroid nodule    small   Tubular adenoma of colon 2017   Urticaria    Uterine fibroid    Past Surgical History:  Procedure Laterality Date   CATARACT EXTRACTION Bilateral 2016   COLONOSCOPY  2007   echocardiogram (other)  01/16/2002   POLYPECTOMY     removed tumors from foot nerves  04/1999   stress cardiolite  02/12/2006   TOENAIL EXCISION     TUBAL LIGATION     TYMPANOSTOMY TUBE PLACEMENT     Patient Active Problem List   Diagnosis Date Noted   Polyneuropathy associated with underlying disease ([place]) 08/31/2022   Type 2 diabetes mellitus with diabetic polyneuropathy, without long-term current use of insulin ([place]) 06/18/2021   Type 2 diabetes mellitus with both eyes affected by  moderate nonproliferative retinopathy without macular edema, without long-term current use of insulin ([place]) 06/18/2021   Diabetes mellitus ([place]) 06/18/2021   Osteoarthritis of right AC (acromioclavicular) joint 03/06/2021   Osteoarthritis of right glenohumeral joint 03/06/2021   Foot pain, bilateral 12/26/2020   Diabetic neuropathy ([place]) 01/10/2020   Hav (hallux abducto valgus), unspecified laterality 01/10/2020   Chronic arthropathy 01/10/2020   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 edema associated with type 2 diabetes mellitus ([place]) 12/26/2019   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   AC (acromioclavicular) arthritis 08/02/2019   Unspecified inflammatory spondylopathy, 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[place]) 04/19/2019   Claudication ([employer]) 04/19/2019   Morbid obesity ([place]) 04/19/2019   Suspected COVID-19 virus infection 04/13/2019   Upper airway cough syndrome 08/22/2018   Greater trochanteric bursitis of both hips 06/15/2018   Trigger point of shoulder region, left 02/07/2018   Hypertension    Mild pulmonary hypertension (HCC)    NSVT (nonsustained ventricular tachycardia) (HCC)    PVC's (premature ventricular contractions)    URI (upper respiratory infection) 08/09/2016   Neck pain 02/10/2016   Urinary incontinence 02/10/2016   Degenerative arthritis of knee, bilateral 07/17/2015   Knee MCL sprain 06/07/2015   Discomfort in  chest 02/15/2015   Chest pain 02/15/2015   DM type 2, controlled, with complication ([place])    Wellness examination 08/06/2014   Acute meniscal tear of knee 02/13/2014   Primary localized osteoarthrosis, lower leg 02/13/2014   Gastrocnemius tear 12/25/2013   UTI (urinary tract infection) 12/20/2013   Right knee pain 12/20/2013   Pulmonary hypertension ([place]) 10/06/2013   DOE (dyspnea on exertion) 08/04/2013   Dysphagia, unspecified(787.20) 08/10/2012   Hip pain, left 02/02/2012   Painful respiration 05/25/2011   Encounter  for long-term (current) use of other medications 01/30/2011   PELVIC PAIN, LEFT 07/30/2010   TOBACCO USE, QUIT 07/07/2010   FATIGUE 12/20/2009   Headache 12/20/2009   Low back pain 12/26/2008   Disorder of liver 04/13/2008   FOOT PAIN, BILATERAL 04/13/2008   FIBROIDS, UTERUS 11/24/2007   THYROID NODULE, LEFT 11/24/2007   HYPERCHOLESTEROLEMIA 11/24/2007   CARPAL TUNNEL SYNDROME, BILATERAL 11/24/2007   ALKALINE PHOSPHATASE, ELEVATED 11/24/2007   Gout 08/10/2007   ANXIETY 08/10/2007   Essential hypertension 08/10/2007   Cough 08/01/2007   Perennial and seasonal allergic rhinitis 01/14/2007   GERD 01/14/2007   Osteoporosis 01/14/2007    REFERRING DIAG:  M25.562,G89.29 (ICD-10-CM) - Chronic pain of left knee  M25.561,G89.29 (ICD-10-CM) - Chronic pain of right knee  S83.232D (ICD-10-CM) - Complex tear of medial meniscus of left knee as current injury, subsequent encounter    THERAPY DIAG:  Difficulty in walking, not elsewhere classified  Localized edema  Stiffness of left knee, not elsewhere classified  Stiffness of right knee, not elsewhere classified  Pain in both knees, unspecified chronicity  Rationale for Evaluation and Treatment Rehabilitation  PERTINENT HISTORY: Type 2 DM, HLD, HTN, osteoporosis   PRECAUTIONS: none  SUBJECTIVE:                                                                                                                                                                                      SUBJECTIVE STATEMENT:   Pt arriving today reporting Rt knee pain of 8/10 since waking up on Saturday. Pt stating left knee is 6/10.   PAIN:  Are you having pain? YES, see above Description: Ache Location: Bil knees Aggravating factors: Prolonged postures, rain, cold weather Relieving factors: Ice, exercise, movement    OBJECTIVE: (objective measures completed at initial evaluation unless otherwise dated) DIAGNOSTIC FINDINGS:    Left knee IMPRESSION: 1.  Root tear of the posterior horn of the medial meniscus with resulting mild peripheral extrusion of the meniscus from the joint space. 2. Mild medial and patellofemoral compartment degenerative chondrosis. No acute osseous findings. 3. Intact lateral meniscus, cruciate and collateral ligaments. 4. Heterogeneous, serpiginous T2 hyperintensity centrally  and laterally in Hoffa's fat may reflect a venous malformation or atypical ganglion.   Right knee IMPRESSION: 1. Chronic root tear of the posterior horn of the medial meniscus with progressive peripheral extrusion of the meniscus from the joint space. 2. Progressive medial compartment osteoarthritis. No acute osseous findings. 3. The lateral meniscus, cruciate and collateral ligaments are intact. 4. Small joint effusion and small Baker's cyst.   PATIENT SURVEYS: 09/08/2022:  FOTO  62%   06/19/2022 FOTO 78 (Goal met)  05/18/2022 FOTO 59 (Goal 63 in 12 visits)    COGNITION: Overall cognitive status: Within functional limits for tasks assessed                         SENSATION: No complaints of peripheral paresthesias, new tingling or numbness   EDEMA:  Noted and not objectively assessed for bilateral knees   LOWER EXTREMITY ROM:   Active ROM Right eval Left eval Rt/Left 05/26/22 Rt/left 06/03/22 Rt/Lt 06/30/2021 Left/Right 07/16/2022 Left 08/25/22 Left 09/01/22  Hip flexion            Hip extension            Hip abduction            Hip adduction            Hip internal rotation            Hip external rotation            Knee flexion 124 90 122/110 122/114 134/132 136/134 134 134  Knee extension -2 0 0/0 0/0 0/0 0/0 0 0  Ankle dorsiflexion            Ankle plantarflexion            Ankle inversion            Ankle eversion             (Blank rows = not tested)   LOWER EXTREMITY STRENGTH:   Strength in pounds assessed by hand-held dynamometer Right 05/18/22 Left 05/18/22 Left/Right in pounds 06/19/22  Left/Rt 07/07/21 Left/Right 07/16/2022 Left/Right 08/06/2022 Left/Rt 09/01/22  Hip flexion           Hip extension           Hip abduction           Hip adduction           Hip internal rotation           Hip external rotation           Knee flexion           Knee extension 19.2 17.3 32.7/43.2 35.8/38.8 30.5/36.5 27.1/31.9 32.2/42.3  Ankle dorsiflexion           Ankle plantarflexion           Ankle inversion           Ankle eversion            (Blank rows = not tested)   GAIT: Distance walked: In the clinic Assistive device utilized: None (was Quad cane small base) Level of assistance: Complete Independence Comments: Rarely uses the cane (previously used the cane almost of the time)     TODAY'S TREATMENT:  08/31/22:  TherEx:  Nustep: level 5 x 7 minutes  Leg Press: bil LE's 87# x 20 Leg Press: single LE 37#  x 15 Wall slides x 10 (c small ball behind back) Neuromuscular Re-edu:  Up and down 1 flight of stairs with single hand rail Stepping over 6 inch hurdles (6 hurdles) x 4  Modalities:  Vasopenumatic: 34 deg, x 10 minutes, medium compression  09/03/22: Verbally reviewed HEP emphasis of Bridges 2 sets of 10 5 seconds; Quadriceps sets 10X 5 seconds; Seated straight leg raises 4 sets of 5 for 3 seconds with 1# weight NuStep Level 5 for 8 minutes   Functional Activities: Double Leg Press 75# 25X slow eccentrics Single Leg Press 31# Bil 15X slow eccentrics Step-down off 2 inch step 10X slow eccentrics Bil Step-up and over 4 inch step 10X slow eccentrics Reviewed the importance of slow eccentrics with sit to stand and stairs for strength and functional progressions   08/31/22:  TherEx:  Nustep: level 5 x 7 minutes  Leg Press: bil LE's 87# x 25 Leg Press: single LE  37#  2 x 10  Sit to stand x 15 from chair using no UE support Neuromuscular Re-edu:  Up and down 1 flight of stairs with single hand rail Stepping over objects lying on floor       PATIENT EDUCATION:   Education details: exercise form and purpose, walking program or even consider walking in the pool to take stress off of knees   Person educated: Patient Education method: Explanation : verbalized understanding, returned demonstration, verbal cues required, tactile cues required, and needs further education   HOME EXERCISE PROGRAM: Access Code: [email or web address] URL: [email or web address] Date: 08/25/2022 Prepared by: Kearney Hard  Exercises - Supine Quadricep Sets  - 3-5 x daily - 4-5 x weekly - 2-3 sets - 10 reps - 5 second hold - Seated Knee Flexion AAROM  - 3 x daily - 1 x weekly - 1 sets - 1 reps - 3 minutes hold - Small Range Straight Leg Raise  - 3-5 x daily - 4-5 x weekly - 2 sets - 5 reps - 3 seconds hold - Yoga Bridge  - 1 x daily - 4-5 x weekly - 2-3 sets - 10 reps - 5 seconds hold - Lateral Step Down  - 1 x daily - 3 x weekly - 2-3 sets - 10 reps - Sit to Stand Without Arm Support  - 1 x daily - 7 x weekly - 2 sets - 10 reps - Standing Hip Abduction with Counter Support  - 1 x daily - 7 x weekly - 2 sets - 10 reps - Standing Hip Extension with Counter Support  - 1 x daily - 7 x weekly - 2 sets - 10 reps - Standing Hip Flexor Stretch  - 1 x daily - 7 x weekly - 3 reps - 20 seconds hold   ASSESSMENT:   CLINICAL IMPRESSION:  Pt arriving today 5 minutes late for her appointment. Pt reporting more pain in her Rt knee than left. Pt tolerating exercises well and is aware we are progressing toward discharge in the next couple of weeks to home based vs  gym based program. Due to increased pain levels this visit. Vasopneumatic performed. Continue skilled PT to progress toward LTG's set.   OBJECTIVE IMPAIRMENTS: Abnormal gait, decreased activity tolerance, decreased endurance, decreased knowledge of condition, difficulty walking, decreased ROM, decreased strength, increased edema, and pain.    ACTIVITY LIMITATIONS: carrying, lifting, bending, sitting, standing, squatting,  stairs, bed mobility, and locomotion level   PARTICIPATION LIMITATIONS: driving, shopping, and community activity   PERSONAL FACTORS: Type 2 DM, HLD, HTN, osteoporosis are also affecting patient's functional outcome.    REHAB  POTENTIAL: Good   CLINICAL DECISION MAKING: Stable/uncomplicated   EVALUATION COMPLEXITY: Low     GOALS: Goals reviewed with patient? Yes   SHORT TERM GOALS: Target date: 07/13/2022  Kyi will improve her bilateral quadriceps strength to at least 30 pounds Baseline: Less than 20 pounds Goal status: MET 06/19/22   2.  Belanna will improve her left knee flexion active range of motion to at least 110 degrees Baseline: 90 degrees Goal status: MET 05/26/22   3.  Eloise will demonstrate independence with her day 1 home exercise program Baseline: Started 1120/2023 Goal status: MET 05/26/22       LONG TERM GOALS: Target date: 10/02/22   Improve Foto to 63 Baseline: 59 Goal status: MET 06/19/2022   2.  Tarshia will report bilateral knee pain consistently 0-4 out of 10 on the visual analog scale (VAS) Baseline: 3-7 out of 10 Goal status: On Going 09/08/2022   3.  Improve bilateral quadriceps strength to at least 50 pounds bilaterally Baseline: Less than 20 pounds Goal status: On Going 09/01/22   4.  Improve bilateral knee flexion active range of motion to 125 degrees and extension to 0 degrees Baseline: 90/124 and 0/-2 Goal status: Met 06/30/2022   5.  Hagan will be independent with her long-term home exercise program at discharge Baseline: Started 05/18/2022 Goal status: On Going 09/08/2022   PLAN:   PT FREQUENCY: 1-2x/ week   PT DURATION: 4 weeks then DC to independent PT (10/02/22)   PLANNED INTERVENTIONS: Therapeutic exercises, Therapeutic activity, Neuromuscular re-education, Balance training, Gait training, Patient/Family education, Self Care, Stair training, Cryotherapy, and Vasopneumatic device   PLAN FOR NEXT SESSION: Continue to increase quadriceps  strength, independence with her HEP and transition Janellie into independent rehabilitation. Recert on [id number]   Kearney Hard, PT, MPT 09/08/22 9:45 AM   09/08/22 9:45 AM   09/08/22 9:45 AM

## 2022-09-10 ENCOUNTER — Encounter: Payer: Self-pay | Admitting: Rehabilitative and Restorative Service Providers"

## 2022-09-10 ENCOUNTER — Ambulatory Visit (INDEPENDENT_AMBULATORY_CARE_PROVIDER_SITE_OTHER): Payer: 59 | Admitting: Rehabilitative and Restorative Service Providers"

## 2022-09-10 DIAGNOSIS — R262 Difficulty in walking, not elsewhere classified: Secondary | ICD-10-CM | POA: Diagnosis not present

## 2022-09-10 DIAGNOSIS — M25561 Pain in right knee: Secondary | ICD-10-CM | POA: Diagnosis not present

## 2022-09-10 DIAGNOSIS — M25562 Pain in left knee: Secondary | ICD-10-CM | POA: Diagnosis not present

## 2022-09-10 DIAGNOSIS — M25662 Stiffness of left knee, not elsewhere classified: Secondary | ICD-10-CM | POA: Diagnosis not present

## 2022-09-10 DIAGNOSIS — R600 Localized edema: Secondary | ICD-10-CM

## 2022-09-10 DIAGNOSIS — M25661 Stiffness of right knee, not elsewhere classified: Secondary | ICD-10-CM | POA: Diagnosis not present

## 2022-09-10 NOTE — Therapy (Addendum)
OUTPATIENT PHYSICAL THERAPY TREATMENT NOTE  /DISCHARGE   Patient Name: Amy Escobar MRN: [id number] DOB:April 03, 1946, 77 y.o., female Today's Date: 09/10/2022  PCP: Olive Bass, FNP  REFERRING PROVIDER: Kathryne Hitch, MD    END OF SESSION:   PT End of Session - 09/10/22 0937     Visit Number 21    Number of Visits 27    Date for PT Re-Evaluation 10/02/22    Authorization Type Recert sent on 09/01/22 for 4 more weeks until 10/02/22    Progress Note Due on Visit 35    PT Start Time 0930    PT Stop Time 1025    PT Time Calculation (min) 55 min    Activity Tolerance Patient tolerated treatment well;No increased pain    Behavior During Therapy WFL for tasks assessed/performed              Past Medical History:  Diagnosis Date   Alkaline phosphatase deficiency    w/u Ne   Allergic rhinitis    Allergy    Anxiety    Arthritis    Cataract    BILATERAL-REMOVED   DM2 (diabetes mellitus, type 2) (HCC)    GERD (gastroesophageal reflux disease)    Gout    Hemorrhoids    Hyperlipidemia    Hypertension    Mild pulmonary hypertension (HCC)    NSVT (nonsustained ventricular tachycardia) (HCC)    Osteoporosis    PVC's (premature ventricular contractions)    Thyroid nodule    small   Tubular adenoma of colon 2017   Urticaria    Uterine fibroid    Past Surgical History:  Procedure Laterality Date   CATARACT EXTRACTION Bilateral 2016   COLONOSCOPY  2007   echocardiogram (other)  01/16/2002   POLYPECTOMY     removed tumors from foot nerves  04/1999   stress cardiolite  02/12/2006   TOENAIL EXCISION     TUBAL LIGATION     TYMPANOSTOMY TUBE PLACEMENT     Patient Active Problem List   Diagnosis Date Noted   Polyneuropathy associated with underlying disease (HCC) 08/31/2022   Type 2 diabetes mellitus with diabetic polyneuropathy, without long-term current use of insulin (HCC) 06/18/2021   Type 2 diabetes mellitus with both eyes affected by moderate  nonproliferative retinopathy without macular edema, without long-term current use of insulin (HCC) 06/18/2021   Diabetes mellitus (HCC) 06/18/2021   Osteoarthritis of right AC (acromioclavicular) joint 03/06/2021   Osteoarthritis of right glenohumeral joint 03/06/2021   Foot pain, bilateral 12/26/2020   Diabetic neuropathy (HCC) 01/10/2020   Hav (hallux abducto valgus), unspecified laterality 01/10/2020   Chronic arthropathy 01/10/2020   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 edema associated with type 2 diabetes mellitus (HCC) 12/26/2019   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   AC (acromioclavicular) arthritis 08/02/2019   Unspecified inflammatory spondylopathy, 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   Claudication (HCC) 04/19/2019   Morbid obesity (HCC) 04/19/2019   Suspected COVID-19 virus infection 04/13/2019   Upper airway cough syndrome 08/22/2018   Greater trochanteric bursitis of both hips 06/15/2018   Trigger point of shoulder region, left 02/07/2018   Hypertension    Mild pulmonary hypertension (HCC)    NSVT (nonsustained ventricular tachycardia) (HCC)    PVC's (premature ventricular contractions)    URI (upper respiratory infection) 08/09/2016   Neck pain 02/10/2016   Urinary incontinence 02/10/2016   Degenerative arthritis of knee, bilateral 07/17/2015   Knee MCL sprain 06/07/2015   Discomfort in  chest 02/15/2015   Chest pain 02/15/2015   DM type 2, controlled, with complication (HCC)    Wellness examination 08/06/2014   Acute meniscal tear of knee 02/13/2014   Primary localized osteoarthrosis, lower leg 02/13/2014   Gastrocnemius tear 12/25/2013   UTI (urinary tract infection) 12/20/2013   Right knee pain 12/20/2013   Pulmonary hypertension (HCC) 10/06/2013   DOE (dyspnea on exertion) 08/04/2013   Dysphagia, unspecified(787.20) 08/10/2012   Hip pain, left 02/02/2012   Painful respiration 05/25/2011   Encounter for  long-term (current) use of other medications 01/30/2011   PELVIC PAIN, LEFT 07/30/2010   TOBACCO USE, QUIT 07/07/2010   FATIGUE 12/20/2009   Headache 12/20/2009   Low back pain 12/26/2008   Disorder of liver 04/13/2008   FOOT PAIN, BILATERAL 04/13/2008   FIBROIDS, UTERUS 11/24/2007   THYROID NODULE, LEFT 11/24/2007   HYPERCHOLESTEROLEMIA 11/24/2007   CARPAL TUNNEL SYNDROME, BILATERAL 11/24/2007   ALKALINE PHOSPHATASE, ELEVATED 11/24/2007   Gout 08/10/2007   ANXIETY 08/10/2007   Essential hypertension 08/10/2007   Cough 08/01/2007   Perennial and seasonal allergic rhinitis 01/14/2007   GERD 01/14/2007   Osteoporosis 01/14/2007    REFERRING DIAG:  M25.562,G89.29 (ICD-10-CM) - Chronic pain of left knee  M25.561,G89.29 (ICD-10-CM) - Chronic pain of right knee  S83.232D (ICD-10-CM) - Complex tear of medial meniscus of left knee as current injury, subsequent encounter    THERAPY DIAG:  Difficulty in walking, not elsewhere classified  Localized edema  Stiffness of left knee, not elsewhere classified  Stiffness of right knee, not elsewhere classified  Pain in both knees, unspecified chronicity  Rationale for Evaluation and Treatment Rehabilitation  PERTINENT HISTORY: Type 2 DM, HLD, HTN, osteoporosis   PRECAUTIONS: none  SUBJECTIVE:                                                                                                                                                                                      SUBJECTIVE STATEMENT:   [name] reports she is having a lot of knee pain this morning.  She is following-up with Dr. Magnus Ivan in April.  She reports good HEP compliance.  PAIN:  Are you having pain?  8-10/10 this week Description: Ache Location: Bil knees Aggravating factors: Prolonged postures, rain, cold weather Relieving factors: Ice, exercise, movement    OBJECTIVE: (objective measures completed at initial evaluation unless otherwise dated) DIAGNOSTIC  FINDINGS:    Left knee IMPRESSION: 1. Root tear of the posterior horn of the medial meniscus with resulting mild peripheral extrusion of the meniscus from the joint space. 2. Mild medial and patellofemoral compartment degenerative chondrosis. No acute osseous findings. 3. Intact lateral meniscus, cruciate and collateral  ligaments. 4. Heterogeneous, serpiginous T2 hyperintensity centrally and laterally in Hoffa's fat may reflect a venous malformation or atypical ganglion.   Right knee IMPRESSION: 1. Chronic root tear of the posterior horn of the medial meniscus with progressive peripheral extrusion of the meniscus from the joint space. 2. Progressive medial compartment osteoarthritis. No acute osseous findings. 3. The lateral meniscus, cruciate and collateral ligaments are intact. 4. Small joint effusion and small Baker's cyst.   PATIENT SURVEYS: 09/08/2022:  FOTO  62%   06/19/2022 FOTO 78 (Goal met)  05/18/2022 FOTO 59 (Goal 63 in 12 visits)    COGNITION: Overall cognitive status: Within functional limits for tasks assessed                         SENSATION: No complaints of peripheral paresthesias, new tingling or numbness   EDEMA:  Noted and not objectively assessed for bilateral knees   LOWER EXTREMITY ROM:   Active ROM Right eval Left eval Rt/Left 05/26/22 Rt/left 06/03/22 Rt/Lt 06/30/2021 Left/Right 07/16/2022 Left 08/25/22 Left 09/01/22  Hip flexion            Hip extension            Hip abduction            Hip adduction            Hip internal rotation            Hip external rotation            Knee flexion 124 90 122/110 122/114 134/132 136/134 134 134  Knee extension -2 0 0/0 0/0 0/0 0/0 0 0  Ankle dorsiflexion            Ankle plantarflexion            Ankle inversion            Ankle eversion             (Blank rows = not tested)   LOWER EXTREMITY STRENGTH:   Strength in pounds assessed by hand-held dynamometer Right 05/18/22 Left 05/18/22  Left/Right in pounds 06/19/22 Left/Rt 07/07/21 Left/Right 07/16/2022 Left/Right 08/06/2022 Left/Rt 09/01/22  Hip flexion           Hip extension           Hip abduction           Hip adduction           Hip internal rotation           Hip external rotation           Knee flexion           Knee extension 19.2 17.3 32.7/43.2 35.8/38.8 30.5/36.5 27.1/31.9 32.2/42.3  Ankle dorsiflexion           Ankle plantarflexion           Ankle inversion           Ankle eversion            (Blank rows = not tested)   GAIT: Distance walked: In the clinic Assistive device utilized: None (was Quad cane small base) Level of assistance: Complete Independence Comments: Rarely uses the cane (previously used the cane almost of the time)     TODAY'S TREATMENT:  09/10/2022 Bridges 2 sets of 10 5 seconds Quadriceps sets 10X 5 seconds Seated straight leg raises 4 sets of 5 for 3 seconds with 1# weight  Functional Activities: Double  Leg Press 75# 25X slow eccentrics Single Leg Press 31# Bil 15X slow eccentrics Reviewed the importance of slow eccentrics with sit to stand and stairs for strength and functional progressions  Vaso Bilateral knees 10 minutes Medium Pressure 34*   09/08/22:  TherEx:  Nustep: level 5 x 7 minutes  Leg Press: bil LE's 87# x 20 Leg Press: single LE 37#  x 15 Wall slides x 10 (c small ball behind back) Neuromuscular Re-edu:  Up and down 1 flight of stairs with single hand rail Stepping over 6 inch hurdles (6 hurdles) x 4  Modalities:  Vasopenumatic: 34 deg, x 10 minutes, medium compression   09/03/22: Verbally reviewed HEP emphasis of Bridges 2 sets of 10 5 seconds; Quadriceps sets 10X 5 seconds; Seated straight leg raises 4 sets of 5 for 3 seconds with 1# weight NuStep Level 5 for 8 minutes   Functional Activities: Double Leg Press 75# 25X slow eccentrics Single Leg Press 31# Bil 15X slow eccentrics Step-down off 2 inch step 10X slow eccentrics Bil Step-up and over 4 inch  step 10X slow eccentrics Reviewed the importance of slow eccentrics with sit to stand and stairs for strength and functional progressions   PATIENT EDUCATION:  Education details: exercise form and purpose, walking program or even consider walking in the pool to take stress off of knees   Person educated: Patient Education method: Explanation : verbalized understanding, returned demonstration, verbal cues required, tactile cues required, and needs further education   HOME EXERCISE PROGRAM: Access Code: [email or web address] URL: [email or web address] Date: 08/25/2022 Prepared by: Narda Amber  Exercises - Supine Quadricep Sets  - 3-5 x daily - 4-5 x weekly - 2-3 sets - 10 reps - 5 second hold - Seated Knee Flexion AAROM  - 3 x daily - 1 x weekly - 1 sets - 1 reps - 3 minutes hold - Small Range Straight Leg Raise  - 3-5 x daily - 4-5 x weekly - 2 sets - 5 reps - 3 seconds hold - Yoga Bridge  - 1 x daily - 4-5 x weekly - 2-3 sets - 10 reps - 5 seconds hold - Lateral Step Down  - 1 x daily - 3 x weekly - 2-3 sets - 10 reps - Sit to Stand Without Arm Support  - 1 x daily - 7 x weekly - 2 sets - 10 reps - Standing Hip Abduction with Counter Support  - 1 x daily - 7 x weekly - 2 sets - 10 reps - Standing Hip Extension with Counter Support  - 1 x daily - 7 x weekly - 2 sets - 10 reps - Standing Hip Flexor Stretch  - 1 x daily - 7 x weekly - 3 reps - 20 seconds hold   ASSESSMENT:   CLINICAL IMPRESSION:  Nella is following up with Dr. Magnus Ivan April.  She feels stronger but she is not where she would like to be from a pain stand point.  Maudell has had injections and extensive, comprehensive physical therapy.  We discussed continuing her quadriceps strength activities until her appointment with Dr. Magnus Ivan where she can discuss her other options.  OBJECTIVE IMPAIRMENTS: Abnormal gait, decreased activity tolerance, decreased endurance, decreased knowledge of condition, difficulty walking,  decreased ROM, decreased strength, increased edema, and pain.    ACTIVITY LIMITATIONS: carrying, lifting, bending, sitting, standing, squatting, stairs, bed mobility, and locomotion level   PARTICIPATION LIMITATIONS: driving, shopping, and community activity   PERSONAL FACTORS: Type 2 DM, HLD, HTN, osteoporosis  are also affecting patient's functional outcome.    REHAB POTENTIAL: Good   CLINICAL DECISION MAKING: Stable/uncomplicated   EVALUATION COMPLEXITY: Low     GOALS: Goals reviewed with patient? Yes   SHORT TERM GOALS: Target date: 07/13/2022  Keyasia will improve her bilateral quadriceps strength to at least 30 pounds Baseline: Less than 20 pounds Goal status: MET 06/19/22   2.  Chastidy will improve her left knee flexion active range of motion to at least 110 degrees Baseline: 90 degrees Goal status: MET 05/26/22   3.  Guenevere will demonstrate independence with her day 1 home exercise program Baseline: Started 1120/2023 Goal status: MET 05/26/22       LONG TERM GOALS: Target date: 10/02/22   Improve Foto to 63 Baseline: 59 Goal status: MET 06/19/2022   2.  Elissia will report bilateral knee pain consistently 0-4 out of 10 on the visual analog scale (VAS) Baseline: 3-7 out of 10 Goal status: On Going 09/10/2022   3.  Improve bilateral quadriceps strength to at least 50 pounds bilaterally Baseline: Less than 20 pounds Goal status: On Going 09/01/22   4.  Improve bilateral knee flexion active range of motion to 125 degrees and extension to 0 degrees Baseline: 90/124 and 0/-2 Goal status: Met 06/30/2022   5.  Aleesa will be independent with her long-term home exercise program at discharge Baseline: Started 05/18/2022 Goal status: On Going 09/10/2022   PLAN:   PT FREQUENCY: 1-2x/ week   PT DURATION: 4 weeks then DC to independent PT (10/02/22)   PLANNED INTERVENTIONS: Therapeutic exercises, Therapeutic activity, Neuromuscular re-education, Balance training, Gait training,  Patient/Family education, Self Care, Stair training, Cryotherapy, and Vasopneumatic device   PLAN FOR NEXT SESSION: Continue to increase quadriceps strength, independence with her HEP and transition Malaina into independent rehabilitation. Recert on 09/01/22   Cherlyn Cushing PT, MPT 09/10/22 10:20 AM    PHYSICAL THERAPY DISCHARGE SUMMARY  Visits from Start of Care: 21  Current functional level related to goals / functional outcomes: See note   Remaining deficits: See note   Education / Equipment: HEP  Patient goals were partially met. Patient is being discharged due to not returning since the last visit.  Chyrel Masson, PT, DPT, OCS, ATC 10/14/22  3:17 PM

## 2022-09-14 ENCOUNTER — Ambulatory Visit: Payer: 59 | Admitting: Family Medicine

## 2022-09-17 ENCOUNTER — Other Ambulatory Visit: Payer: Self-pay

## 2022-09-17 DIAGNOSIS — I2510 Atherosclerotic heart disease of native coronary artery without angina pectoris: Secondary | ICD-10-CM

## 2022-09-17 DIAGNOSIS — E782 Mixed hyperlipidemia: Secondary | ICD-10-CM

## 2022-09-17 DIAGNOSIS — I10 Essential (primary) hypertension: Secondary | ICD-10-CM

## 2022-09-17 MED ORDER — VALSARTAN 160 MG PO TABS: 160.0000 mg | ORAL_TABLET | Freq: Every day | ORAL | 3 refills | Status: DC

## 2022-09-23 DIAGNOSIS — L603 Nail dystrophy: Secondary | ICD-10-CM | POA: Diagnosis not present

## 2022-09-23 DIAGNOSIS — I739 Peripheral vascular disease, unspecified: Secondary | ICD-10-CM | POA: Diagnosis not present

## 2022-09-23 DIAGNOSIS — E1142 Type 2 diabetes mellitus with diabetic polyneuropathy: Secondary | ICD-10-CM | POA: Diagnosis not present

## 2022-09-23 DIAGNOSIS — E1151 Type 2 diabetes mellitus with diabetic peripheral angiopathy without gangrene: Secondary | ICD-10-CM | POA: Diagnosis not present

## 2022-09-23 DIAGNOSIS — L84 Corns and callosities: Secondary | ICD-10-CM | POA: Diagnosis not present

## 2022-10-01 ENCOUNTER — Telehealth: Payer: Self-pay | Admitting: Family

## 2022-10-01 NOTE — Telephone Encounter (Signed)
Copied from [place] [phone]. Topic: Medicare AWV >> Oct 01, 2022  9:52 AM Devoria Glassing wrote: Reason for CRM: Called patient to schedule Medicare Annual Wellness Visit (AWV). No voicemail available to leave a message.  Last date of AWV: 10/07/21  Please schedule an appointment at any time with Brayton El, LPN .  If any questions, please contact me.  Thank you ,  Sherol Dade; [place] Direct Dial: [phone]

## 2022-10-05 ENCOUNTER — Ambulatory Visit (INDEPENDENT_AMBULATORY_CARE_PROVIDER_SITE_OTHER): Payer: 59 | Admitting: Orthopaedic Surgery

## 2022-10-05 DIAGNOSIS — M79602 Pain in left arm: Secondary | ICD-10-CM | POA: Diagnosis not present

## 2022-10-05 MED ORDER — CELECOXIB 200 MG PO CAPS: 200.0000 mg | ORAL_CAPSULE | Freq: Two times a day (BID) | ORAL | 1 refills | Status: DC | PRN

## 2022-10-05 NOTE — Progress Notes (Signed)
The patient is a 77 year old female well-known to [place].  She has significant arthritis in both her knees and hyaluronic acid injections placed in both knees back in February only worked a little bit.  She is still not interested in any type of knee replacement surgery states that she is just too old.  She is complaining of left arm pain though.  She fell about a year ago and since then has reported pain in her left elbow, forearm and wrist.  X-rays were obtained in the past of her left shoulder and left elbow and these were negative.  On exam she has full range of motion of her left wrist and her left elbow with some pain with dorsiflexion of the wrist.  I cannot palpate any deformities.  She has good pulses and normal sensation.  Her elbow is ligamentously stable and the wrist is limited to stable.  It is painful.  I would like to try Celebrex as an anti-inflammatory and send her to hand therapy to see if there is any modalities that can help decrease her left forearm and wrist pain.  We can then see her back after a course of therapy.  She agrees with this treatment plan.

## 2022-10-06 ENCOUNTER — Encounter (INDEPENDENT_AMBULATORY_CARE_PROVIDER_SITE_OTHER): Payer: 59 | Admitting: Ophthalmology

## 2022-10-06 ENCOUNTER — Other Ambulatory Visit: Payer: Self-pay

## 2022-10-06 DIAGNOSIS — M79602 Pain in left arm: Secondary | ICD-10-CM

## 2022-10-19 ENCOUNTER — Ambulatory Visit: Payer: 59 | Admitting: Physical Therapy

## 2022-10-23 ENCOUNTER — Other Ambulatory Visit: Payer: Self-pay | Admitting: Family

## 2022-10-23 DIAGNOSIS — Z1231 Encounter for screening mammogram for malignant neoplasm of breast: Secondary | ICD-10-CM

## 2022-10-23 NOTE — Congregational Nurse Program (Signed)
Seen at [facility].  Reports pain in shoulder.  Feels muscular or bone related.  Has appt  on Monday. Barry Dienes,  Congregational Nurse, [phone]

## 2022-10-26 ENCOUNTER — Encounter: Payer: Self-pay | Admitting: Rehabilitative and Restorative Service Providers"

## 2022-10-26 ENCOUNTER — Ambulatory Visit (INDEPENDENT_AMBULATORY_CARE_PROVIDER_SITE_OTHER): Payer: 59 | Admitting: Rehabilitative and Restorative Service Providers"

## 2022-10-26 ENCOUNTER — Other Ambulatory Visit: Payer: Self-pay

## 2022-10-26 ENCOUNTER — Ambulatory Visit: Payer: Medicare Other | Admitting: Cardiology

## 2022-10-26 DIAGNOSIS — M25632 Stiffness of left wrist, not elsewhere classified: Secondary | ICD-10-CM | POA: Diagnosis not present

## 2022-10-26 DIAGNOSIS — M25532 Pain in left wrist: Secondary | ICD-10-CM

## 2022-10-26 DIAGNOSIS — M6281 Muscle weakness (generalized): Secondary | ICD-10-CM

## 2022-10-26 DIAGNOSIS — M25612 Stiffness of left shoulder, not elsewhere classified: Secondary | ICD-10-CM

## 2022-10-26 NOTE — Therapy (Signed)
OUTPATIENT OCCUPATIONAL THERAPY ORTHO EVALUATION  Patient Name: Amy Escobar MRN: [id number] DOB:1946/03/15, 77 y.o., female Today's Date: 10/26/2022  PCP: Ria Clock, FNP REFERRING PROVIDER: Kathryne Hitch, MD   END OF SESSION:  OT End of Session - 10/26/22 0935     Visit Number 1    Number of Visits 12    Date for OT Re-Evaluation 12/04/22    Authorization Type [employer] Medicare    OT Start Time 0936    OT Stop Time 1026    OT Time Calculation (min) 50 min    Activity Tolerance Patient tolerated treatment well;Patient limited by pain;Patient limited by fatigue    Behavior During Therapy [facility] for tasks assessed/performed             Past Medical History:  Diagnosis Date   Alkaline phosphatase deficiency    w/u Ne   Allergic rhinitis    Allergy    Anxiety    Arthritis    Cataract    BILATERAL-REMOVED   DM2 (diabetes mellitus, type 2) (HCC)    GERD (gastroesophageal reflux disease)    Gout    Hemorrhoids    Hyperlipidemia    Hypertension    Mild pulmonary hypertension (HCC)    NSVT (nonsustained ventricular tachycardia) (HCC)    Osteoporosis    PVC's (premature ventricular contractions)    Thyroid nodule    small   Tubular adenoma of colon 2017   Urticaria    Uterine fibroid    Past Surgical History:  Procedure Laterality Date   CATARACT EXTRACTION Bilateral 2016   COLONOSCOPY  2007   echocardiogram (other)  01/16/2002   POLYPECTOMY     removed tumors from foot nerves  04/1999   stress cardiolite  02/12/2006   TOENAIL EXCISION     TUBAL LIGATION     TYMPANOSTOMY TUBE PLACEMENT     Patient Active Problem List   Diagnosis Date Noted   Polyneuropathy associated with underlying disease (HCC) 08/31/2022   Type 2 diabetes mellitus with diabetic polyneuropathy, without long-term current use of insulin (HCC) 06/18/2021   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 without macular edema, without  long-term current use of insulin (HCC) 06/18/2021   Diabetes mellitus (HCC) 06/18/2021   Osteoarthritis of right AC (acromioclavicular) joint 03/06/2021   Osteoarthritis of right glenohumeral joint 03/06/2021   Foot pain, bilateral 12/26/2020   Diabetic neuropathy (HCC) 01/10/2020   Hav (hallux abducto valgus), unspecified laterality 01/10/2020   Chronic arthropathy 01/10/2020   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 edema associated with type 2 diabetes mellitus (HCC) 12/26/2019   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   AC (acromioclavicular) arthritis 08/02/2019   Unspecified inflammatory spondylopathy, 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   Claudication (HCC) 04/19/2019   Morbid obesity (HCC) 04/19/2019   Suspected COVID-19 virus infection 04/13/2019   Upper airway cough syndrome 08/22/2018   Greater trochanteric bursitis of both hips 06/15/2018   Trigger point of shoulder region, left 02/07/2018   Hypertension    Mild pulmonary hypertension (HCC)    NSVT (nonsustained ventricular tachycardia) (HCC)    PVC's (premature ventricular contractions)    URI (upper respiratory infection) 08/09/2016   Neck pain 02/10/2016   Urinary incontinence 02/10/2016   Degenerative arthritis of knee, bilateral 07/17/2015   Knee MCL sprain 06/07/2015   Discomfort in chest 02/15/2015   Chest pain 02/15/2015   DM type 2, controlled, with complication (HCC)    Wellness examination  08/06/2014   Acute meniscal tear of knee 02/13/2014   Primary localized osteoarthrosis, lower leg 02/13/2014   Gastrocnemius tear 12/25/2013   UTI (urinary tract infection) 12/20/2013   Right knee pain 12/20/2013   Pulmonary hypertension (HCC) 10/06/2013   DOE (dyspnea on exertion) 08/04/2013   Dysphagia, unspecified(787.20) 08/10/2012   Hip pain, left 02/02/2012   Painful respiration 05/25/2011   Encounter for long-term (current) use of other medications 01/30/2011   PELVIC  PAIN, LEFT 07/30/2010   TOBACCO USE, QUIT 07/07/2010   FATIGUE 12/20/2009   Headache 12/20/2009   Low back pain 12/26/2008   Disorder of liver 04/13/2008   FOOT PAIN, BILATERAL 04/13/2008   FIBROIDS, UTERUS 11/24/2007   THYROID NODULE, LEFT 11/24/2007   HYPERCHOLESTEROLEMIA 11/24/2007   CARPAL TUNNEL SYNDROME, BILATERAL 11/24/2007   ALKALINE PHOSPHATASE, ELEVATED 11/24/2007   Gout 08/10/2007   ANXIETY 08/10/2007   Essential hypertension 08/10/2007   Cough 08/01/2007   Perennial and seasonal allergic rhinitis 01/14/2007   GERD 01/14/2007   Osteoporosis 01/14/2007    ONSET DATE: chronic ~1 year  REFERRING DIAG:  M79.602 (ICD-10-CM) - Left arm pain  M25.539 (ICD-10-CM) - Wrist pain    THERAPY DIAG:  Muscle weakness (generalized)  Pain in left wrist  Stiffness of left wrist, not elsewhere classified  Stiffness of left shoulder, not elsewhere classified  Rationale for Evaluation and Treatment: Rehabilitation  SUBJECTIVE:   SUBJECTIVE STATEMENT: She states falling a year ago and landing on her Lt arm (elbow to hand). She states she's been having Lt sided pain and problems since. She has diabetic neuropathy, and does complain of hand tingling/numbness at times but not at the moment. Rt arm does not bother her at the moment.  She is wearing a store-bought prefabricated neoprene wrist support brace today on Lt wrist.    PERTINENT HISTORY: Per MD notes: OT left forearm and left wrist pain, " She fell about a year ago and since then has reported pain in her left elbow, forearm and wrist.  X-rays were obtained in the past of her left shoulder and left elbow and these were negative. On exam she has full range of motion of her left wrist and her left elbow with some pain with dorsiflexion of the wrist.  I cannot palpate any deformities.  She has good pulses and normal sensation.  Her elbow is ligamentously stable and the wrist is limited to stable.  It is painful. I would like to try  Celebrex as an anti-inflammatory and send her to hand therapy to see if there is any modalities that can help decrease her left forearm and wrist pain.  We can then see her back after a course of therapy.  She agrees with this treatment plan."  PRECAUTIONS: None; WEIGHT BEARING RESTRICTIONS: No  PAIN:  Are you having pain? Yes: NPRS scale: 5/10 Pain location: dorsal forearm to dorsal wrist Pain description: tight and pulling Aggravating factors: overuse Relieving factors: pressure  FALLS: Has patient fallen in last 6 months? No  LIVING ENVIRONMENT: Lives with: lives alone (widowed)  Lives in: House/apartment Has following equipment at home: Single point cane and rollator (she is using neither today)  PLOF: Independent  PATIENT GOALS: To have less pain in left arm   OBJECTIVE: (All objective assessments below are from initial evaluation on: 10/26/22 unless otherwise specified.)   HAND DOMINANCE: Right   ADLs: Overall ADLs: States decreased ability to grab, hold household objects, pain and inability to open containers, perform FMS tasks (manipulate fasteners  on clothing), mild to moderate bathing problems as well.    FUNCTIONAL OUTCOME MEASURES: Eval: Quck DASH 41% impairment today  (Higher % Score  =  More Impairment)    UPPER EXTREMITY ROM     Shoulder to Wrist AROM Left eval  Shoulder flexion 111  Shoulder abduction   Shoulder extension   Shoulder internal rotation 38  Shoulder external rotation 55  Elbow flexion full  Elbow extension full  Forearm supination   Forearm pronation    Wrist flexion 61 (57* Rt)  Wrist extension 53 (45* Rt)  (Blank rows = not tested)   Hand AROM Right eval Left eval  Full Fist Ability (or Gap to Distal Palmar Crease) Full but loose Full but loose  (Blank rows = not tested)   UPPER EXTREMITY MMT:     MMT Right 10/26/22 Left 10/26/22  Shoulder flexion    Shoulder abduction    Shoulder adduction    Shoulder extension     Shoulder internal rotation    Shoulder external rotation    Elbow flexion    Elbow extension    Forearm supination    Forearm pronation    Wrist flexion    Wrist extension 5/5 4/5 tender to elbow  (Blank rows = not tested)  HAND FUNCTION: Eval: Observed weakness in affected hand.  Grip strength Right: 21.8 lbs, Left: 18.7 lbs   COORDINATION: Eval: No significant observed coordination impairments, other than pain with attempted sh motion (gross motion)  SENSATION: Eval:  Light touch intact today, though she does c/o neuropathy at times and has some signs of ulnar and possibly radial neuritis   EDEMA:   Eval: none significant   COGNITION: Eval: Overall cognitive status: WFL for evaluation today   OBSERVATIONS:   Eval: negative b/l wrist flexion test (Phalen's) and neg Tinel's at bil carpal tunnel. positive elbow flexion test on Lt arm (~7 sec), neg Tinel's @ Lt elbow and carpal tunnel, tight muscle spasms in upper trap, supraspinatus, ticeps, FA extensor wad Lt side compared to Rt side. Mild signs and c/o of Lt lateral epicondylitis   She presents as tight painful muscle spasms from the shoulder down to the dorsal forearm as well as mild left lateral epicondylitis and ulnar nerve neuritis-possibly some radial and median nerve tenderness as well partially from muscle spasms and general tightness in the left arm.  This is probably originating from guarding from pain habits and postures after her fall   TODAY'S TREATMENT:  Post-evaluation treatment:  She was educated to avoid prolonged bent elbow postures as well as direct pressure on her left elbow to help prevent soreness.  She was educated to use moist heat modality for about 5 minutes on her shoulder elbow before trying the following stretch routine.  Due to the length of evaluation and discussion, only 2 exercises were educated upon today; wrist stretches and flexion and extension.  She tolerates these well with no additional  pain, states feeling a stretch.  She did state getting a small tingle in her fingers with wrist flexion stretch which is interesting as she had a negative wrist flexion test during the evaluation earlier.  Exercises - Standing Radial Nerve Glide  - 4-6 x daily - 1 sets - 10-15 reps - Ulnar Nerve Flossing  - 3-4 x daily - 1-2 sets - 5-10 reps - Seated Scapular Retraction  - 4 x daily - 5-10 reps - Seated Shoulder Flexion Towel Slide at Table Top  - 4 x daily -  3-5 reps - 15 hold - Seated Shoulder External Rotation PROM on Table  - 3-4 x daily - 3-5 reps - 15 sec hold - Sleeper Stretch  - 3-4 x daily - 3-5 reps - 15 hold - Standing neck/upper traps stretch  - 4-6 x daily - 3-5 reps - 15 sec hold - Wrist Flexion Stretch  - 4 x daily - 3-5 reps - 15 sec hold - Wrist Prayer Stretch  - 4 x daily - 3-5 reps - 15 sec hold - BACK KNUCKLE STRETCHES   - 4 x daily - 3-5 reps - 15 sec hold   PATIENT EDUCATION: Education details: See tx section above for details  Person educated: Patient Education method: Verbal Instruction, Teach back, Handouts  Education comprehension: States and demonstrates understanding, Additional Education required    HOME EXERCISE PROGRAM: Access Code: [email or web address] URL: [email or web address] Date: 10/26/2022 Prepared by: Fannie Knee   GOALS: Goals reviewed with patient? Yes   SHORT TERM GOALS: (STG required if POC>30 days) Target Date: 11/06/22  Pt will demo/state understanding of initial HEP to improve pain levels and prerequisite motion. Goal status: INITIAL   LONG TERM GOALS: Target Date: 12/04/22  Pt will improve functional ability by decreased impairment per Quick DASH assessment from 41% to 20% or better, for better quality of life. Goal status: INITIAL  2.  Pt will improve grip strength in b/l hands to at least 25lbs for functional use at home and in IADLs. Goal status: INITIAL  3.  Pt will improve A/ROM in Lt wrist ext from 53* to at  least 60*, to have functional motion for tasks like reach and grasp.  Goal status: INITIAL  4.  Pt will improve strength in Lt wrist ext from 4/5 tender MMT to at least 4+/5 MMT to have increased functional ability to carry out selfcare and higher-level homecare tasks with no difficulty. Goal status: INITIAL  5.  Pt will improve A/ROM in Lt Sh IR from 38* to at least 45*, to have less sh pain.  Goal status: INITIAL  6.  Pt will decrease pain at rest (in UEs) from 5/10 to 2/10 or better to have better sleep and occupational participation in daily roles. Goal status: INITIAL   ASSESSMENT:  CLINICAL IMPRESSION: Patient is a 77 y.o. female who was seen today for occupational therapy evaluation for chronic left-sided tightness and pain also some paresthesia complaints and nerve sensitivity which negatively impact her daily routines.  She will benefit from outpatient occupational therapy to decrease symptoms and increase quality of life.   PERFORMANCE DEFICITS: in functional skills including ADLs, IADLs, sensation, ROM, strength, pain, fascial restrictions, muscle spasms, flexibility, Gross motor control, body mechanics, endurance, decreased knowledge of precautions, and UE functional use, cognitive skills including problem solving and safety awareness, and psychosocial skills including coping strategies, environmental adaptation, and habits.   IMPAIRMENTS: are limiting patient from ADLs, IADLs, rest and sleep, and leisure.   COMORBIDITIES: has co-morbidities such as gout, HTN, anxiety, LBP, DM II, COPD, fatigue, polyneuropathy, and many more  that affects occupational performance. Patient will benefit from skilled OT to address above impairments and improve overall function.  MODIFICATION OR ASSISTANCE TO COMPLETE EVALUATION: Min-Moderate modification of tasks or assist with assess necessary to complete an evaluation.  OT OCCUPATIONAL PROFILE AND HISTORY: Problem focused assessment: Including  review of records relating to presenting problem.  CLINICAL DECISION MAKING: Moderate - several treatment options, min-mod task modification necessary  REHAB POTENTIAL: Excellent  EVALUATION COMPLEXITY: Low  PLAN:  OT FREQUENCY: 2x/week  OT DURATION: 6 weeks (through 12/04/22 as needed)   PLANNED INTERVENTIONS: self care/ADL training, therapeutic exercise, therapeutic activity, neuromuscular re-education, manual therapy, passive range of motion, splinting, electrical stimulation, ultrasound, compression bandaging, moist heat, cryotherapy, contrast bath, patient/family education, energy conservation, coping strategies training, DME and/or AE instructions, and Dry needling  RECOMMENDED OTHER SERVICES: none now   CONSULTED AND AGREED WITH PLAN OF CARE: Patient  PLAN FOR NEXT SESSION:  Use moist heat modality as well as potentially dry needling to trigger points or other manual therapy, educate on entire comprehensive home exercise program including neuromuscular reeducation with nerve glides.    Fannie Knee, OTR/L, CHT 10/26/2022, 10:44 AM

## 2022-11-02 ENCOUNTER — Ambulatory Visit (INDEPENDENT_AMBULATORY_CARE_PROVIDER_SITE_OTHER): Payer: 59 | Admitting: Rehabilitative and Restorative Service Providers"

## 2022-11-02 ENCOUNTER — Other Ambulatory Visit: Payer: Self-pay | Admitting: Family

## 2022-11-02 ENCOUNTER — Encounter: Payer: 59 | Admitting: Rehabilitative and Restorative Service Providers"

## 2022-11-02 ENCOUNTER — Encounter: Payer: Self-pay | Admitting: Rehabilitative and Restorative Service Providers"

## 2022-11-02 DIAGNOSIS — M25612 Stiffness of left shoulder, not elsewhere classified: Secondary | ICD-10-CM | POA: Diagnosis not present

## 2022-11-02 DIAGNOSIS — M25632 Stiffness of left wrist, not elsewhere classified: Secondary | ICD-10-CM | POA: Diagnosis not present

## 2022-11-02 DIAGNOSIS — R058 Other specified cough: Secondary | ICD-10-CM

## 2022-11-02 DIAGNOSIS — M25532 Pain in left wrist: Secondary | ICD-10-CM | POA: Diagnosis not present

## 2022-11-02 DIAGNOSIS — M6281 Muscle weakness (generalized): Secondary | ICD-10-CM

## 2022-11-02 NOTE — Congregational Nurse Program (Signed)
Seen at [facility].  BP 127/77, p.97  Taking medication.  Encouraged to take BP at home regularly.  Juliann Pulse, RN  Congregational Nursing  [phone]

## 2022-11-02 NOTE — Therapy (Signed)
OUTPATIENT OCCUPATIONAL THERAPY TREATMENT NOTE  Patient Name: Amy Escobar MRN: [id number] DOB:25-Feb-1946, 77 y.o., female Today's Date: 11/02/2022  PCP: Ria Clock, FNP REFERRING PROVIDER: Kathryne Hitch, MD   END OF SESSION:  OT End of Session - 11/02/22 1300     Visit Number 2    Number of Visits 12    Date for OT Re-Evaluation 12/04/22    Authorization Type [employer]    OT Start Time 1300    OT Stop Time 1345    OT Time Calculation (min) 45 min    Activity Tolerance Patient tolerated treatment well;Patient limited by pain;Patient limited by fatigue;No increased pain    Behavior During Therapy WFL for tasks assessed/performed              Past Medical History:  Diagnosis Date   Alkaline phosphatase deficiency    w/u Ne   Allergic rhinitis    Allergy    Anxiety    Arthritis    Cataract    BILATERAL-REMOVED   DM2 (diabetes mellitus, type 2) (HCC)    GERD (gastroesophageal reflux disease)    Gout    Hemorrhoids    Hyperlipidemia    Hypertension    Mild pulmonary hypertension (HCC)    NSVT (nonsustained ventricular tachycardia) (HCC)    Osteoporosis    PVC's (premature ventricular contractions)    Thyroid nodule    small   Tubular adenoma of colon 2017   Urticaria    Uterine fibroid    Past Surgical History:  Procedure Laterality Date   CATARACT EXTRACTION Bilateral 2016   COLONOSCOPY  2007   echocardiogram (other)  01/16/2002   POLYPECTOMY     removed tumors from foot nerves  04/1999   stress cardiolite  02/12/2006   TOENAIL EXCISION     TUBAL LIGATION     TYMPANOSTOMY TUBE PLACEMENT     Patient Active Problem List   Diagnosis Date Noted   Polyneuropathy associated with underlying disease (HCC) 08/31/2022   Type 2 diabetes mellitus with diabetic polyneuropathy, without long-term current use of insulin (HCC) 06/18/2021   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 without macular  edema, without long-term current use of insulin (HCC) 06/18/2021   Diabetes mellitus (HCC) 06/18/2021   Osteoarthritis of right AC (acromioclavicular) joint 03/06/2021   Osteoarthritis of right glenohumeral joint 03/06/2021   Foot pain, bilateral 12/26/2020   Diabetic neuropathy (HCC) 01/10/2020   Hav (hallux abducto valgus), unspecified laterality 01/10/2020   Chronic arthropathy 01/10/2020   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 edema associated with type 2 diabetes mellitus (HCC) 12/26/2019   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   AC (acromioclavicular) arthritis 08/02/2019   Unspecified inflammatory spondylopathy, 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   Claudication (HCC) 04/19/2019   Morbid obesity (HCC) 04/19/2019   Suspected COVID-19 virus infection 04/13/2019   Upper airway cough syndrome 08/22/2018   Greater trochanteric bursitis of both hips 06/15/2018   Trigger point of shoulder region, left 02/07/2018   Hypertension    Mild pulmonary hypertension (HCC)    NSVT (nonsustained ventricular tachycardia) (HCC)    PVC's (premature ventricular contractions)    URI (upper respiratory infection) 08/09/2016   Neck pain 02/10/2016   Urinary incontinence 02/10/2016   Degenerative arthritis of knee, bilateral 07/17/2015   Knee MCL sprain 06/07/2015   Discomfort in chest 02/15/2015   Chest pain 02/15/2015   DM type 2, controlled, with complication (HCC)  Wellness examination 08/06/2014   Acute meniscal tear of knee 02/13/2014   Primary localized osteoarthrosis, lower leg 02/13/2014   Gastrocnemius tear 12/25/2013   UTI (urinary tract infection) 12/20/2013   Right knee pain 12/20/2013   Pulmonary hypertension (HCC) 10/06/2013   DOE (dyspnea on exertion) 08/04/2013   Dysphagia, unspecified(787.20) 08/10/2012   Hip pain, left 02/02/2012   Painful respiration 05/25/2011   Encounter for long-term (current) use of other medications  01/30/2011   PELVIC PAIN, LEFT 07/30/2010   TOBACCO USE, QUIT 07/07/2010   FATIGUE 12/20/2009   Headache 12/20/2009   Low back pain 12/26/2008   Disorder of liver 04/13/2008   FOOT PAIN, BILATERAL 04/13/2008   FIBROIDS, UTERUS 11/24/2007   THYROID NODULE, LEFT 11/24/2007   HYPERCHOLESTEROLEMIA 11/24/2007   CARPAL TUNNEL SYNDROME, BILATERAL 11/24/2007   ALKALINE PHOSPHATASE, ELEVATED 11/24/2007   Gout 08/10/2007   ANXIETY 08/10/2007   Essential hypertension 08/10/2007   Cough 08/01/2007   Perennial and seasonal allergic rhinitis 01/14/2007   GERD 01/14/2007   Osteoporosis 01/14/2007    ONSET DATE: chronic ~1 year  REFERRING DIAG:  M79.602 (ICD-10-CM) - Left arm pain  M25.539 (ICD-10-CM) - Wrist pain    THERAPY DIAG:  Muscle weakness (generalized)  Pain in left wrist  Stiffness of left shoulder, not elsewhere classified  Stiffness of left wrist, not elsewhere classified  Rationale for Evaluation and Treatment: Rehabilitation  PERTINENT HISTORY: Per MD notes: OT left forearm and left wrist pain, " She fell about a year ago and since then has reported pain in her left elbow, forearm and wrist.  X-rays were obtained in the past of her left shoulder and left elbow and these were negative. On exam she has full range of motion of her left wrist and her left elbow with some pain with dorsiflexion of the wrist.  I cannot palpate any deformities.  She has good pulses and normal sensation.  Her elbow is ligamentously stable and the wrist is limited to stable.  It is painful. I would like to try Celebrex as an anti-inflammatory and send her to hand therapy to see if there is any modalities that can help decrease her left forearm and wrist pain.  We can then see her back after a course of therapy.  She agrees with this treatment plan." She states falling a year ago and landing on her Lt arm (elbow to hand). She states she's been having Lt sided pain and problems since. She has diabetic  neuropathy, and does complain of hand tingling/numbness at times but not at the moment. Rt arm does not bother her at the moment.  She is wearing a store-bought prefabricated neoprene wrist support brace today on Lt wrist.   PRECAUTIONS: None; WEIGHT BEARING RESTRICTIONS: No   SUBJECTIVE:   SUBJECTIVE STATEMENT: She states her elbow hurts most today, she does notice a habit of keeping Lt elbow up on car door which is painful and she's trying to stop. Marland Kitchen    PAIN:  Are you having pain?  Yes: NPRS scale: 6/10 Pain location: dorsal forearm to dorsal wrist Pain description: tight and pulling Aggravating factors: overuse Relieving factors: pressure  FALLS: Has patient fallen in last 6 months? No  LIVING ENVIRONMENT: Lives with: lives alone (widowed)  Lives in: House/apartment Has following equipment at home: Single point cane and rollator (she is using neither today)  PLOF: Independent  PATIENT GOALS: To have less pain in left arm   OBJECTIVE: (All objective assessments below are from initial evaluation  on: 10/26/22 unless otherwise specified.)   HAND DOMINANCE: Right   ADLs: Overall ADLs: States decreased ability to grab, hold household objects, pain and inability to open containers, perform FMS tasks (manipulate fasteners on clothing), mild to moderate bathing problems as well.    FUNCTIONAL OUTCOME MEASURES: Eval: Quck DASH 41% impairment today  (Higher % Score  =  More Impairment)    UPPER EXTREMITY ROM     Shoulder to Wrist AROM Left eval  Shoulder flexion 111  Shoulder abduction   Shoulder extension   Shoulder internal rotation 38  Shoulder external rotation 55  Elbow flexion full  Elbow extension full  Forearm supination   Forearm pronation    Wrist flexion 61 (57* Rt)  Wrist extension 53 (45* Rt)  (Blank rows = not tested)   Hand AROM Right eval Left eval  Full Fist Ability (or Gap to Distal Palmar Crease) Full but loose Full but loose  (Blank rows = not  tested)   UPPER EXTREMITY MMT:     MMT Right 10/26/22 Left 10/26/22  Shoulder flexion    Shoulder abduction    Shoulder adduction    Shoulder extension    Shoulder internal rotation    Shoulder external rotation    Elbow flexion    Elbow extension    Forearm supination    Forearm pronation    Wrist flexion    Wrist extension 5/5 4/5 tender to elbow  (Blank rows = not tested)  HAND FUNCTION: Eval: Observed weakness in affected hand.  Grip strength Right: 21.8 lbs, Left: 18.7 lbs   COORDINATION: Eval: No significant observed coordination impairments, other than pain with attempted sh motion (gross motion)  SENSATION: Eval:  Light touch intact today, though she does c/o neuropathy at times and has some signs of ulnar and possibly radial neuritis   OBSERVATIONS:   Eval: negative b/l wrist flexion test (Phalen's) and neg Tinel's at bil carpal tunnel. positive elbow flexion test on Lt arm (~7 sec), neg Tinel's @ Lt elbow and carpal tunnel, tight muscle spasms in upper trap, supraspinatus, ticeps, FA extensor wad Lt side compared to Rt side. Mild signs and c/o of Lt lateral epicondylitis   She presents as tight painful muscle spasms from the shoulder down to the dorsal forearm as well as mild left lateral epicondylitis and ulnar nerve neuritis-possibly some radial and median nerve tenderness as well partially from muscle spasms and general tightness in the left arm.  This is probably originating from guarding from pain habits and postures after her fall   TODAY'S TREATMENT:  11/02/22: OT puts MH on her sore Lt elbow while showing and demo'ing HEP ~5 mins. She states this feels good, and then she performs back HEP with cues from OT to slow down on stretches, for good form. She states understanding all- no added pain, but feels proper stretches. Nerve glides also done for radial and ulnar nerve tightness. Tolerates well. Leaves in no added pain states ~4/10.   Exercises - Standing Radial  Nerve Glide  - 4-6 x daily - 1 sets - 10-15 reps - Ulnar Nerve Flossing  - 3-4 x daily - 1-2 sets - 5-10 reps - Seated Scapular Retraction  - 4 x daily - 5-10 reps - Seated Shoulder Flexion Towel Slide at Table Top  - 4 x daily - 3-5 reps - 15 hold - Seated Shoulder External Rotation PROM on Table  - 3-4 x daily - 3-5 reps - 15 sec hold - Sleeper  Stretch  - 3-4 x daily - 3-5 reps - 15 hold - Standing neck/upper traps stretch  - 4-6 x daily - 3-5 reps - 15 sec hold - Wrist Flexion Stretch  - 4 x daily - 3-5 reps - 15 sec hold - Wrist Prayer Stretch  - 4 x daily - 3-5 reps - 15 sec hold - BACK KNUCKLE STRETCHES   - 4 x daily - 3-5 reps - 15 sec hold  PATIENT EDUCATION: Education details: See tx section above for details  Person educated: Patient Education method: Verbal Instruction, Teach back, Handouts  Education comprehension: States and demonstrates understanding, Additional Education required    HOME EXERCISE PROGRAM: Access Code: [email or web address] URL: [email or web address] Date: 10/26/2022 Prepared by: Fannie Knee   GOALS: Goals reviewed with patient? Yes   SHORT TERM GOALS: (STG required if POC>30 days) Target Date: 11/06/22  Pt will demo/state understanding of initial HEP to improve pain levels and prerequisite motion. Goal status: INITIAL   LONG TERM GOALS: Target Date: 12/04/22  Pt will improve functional ability by decreased impairment per Quick DASH assessment from 41% to 20% or better, for better quality of life. Goal status: INITIAL  2.  Pt will improve grip strength in b/l hands to at least 25lbs for functional use at home and in IADLs. Goal status: INITIAL  3.  Pt will improve A/ROM in Lt wrist ext from 53* to at least 60*, to have functional motion for tasks like reach and grasp.  Goal status: INITIAL  4.  Pt will improve strength in Lt wrist ext from 4/5 tender MMT to at least 4+/5 MMT to have increased functional ability to carry out selfcare and  higher-level homecare tasks with no difficulty. Goal status: INITIAL  5.  Pt will improve A/ROM in Lt Sh IR from 38* to at least 45*, to have less sh pain.  Goal status: INITIAL  6.  Pt will decrease pain at rest (in UEs) from 5/10 to 2/10 or better to have better sleep and occupational participation in daily roles. Goal status: INITIAL   ASSESSMENT:  CLINICAL IMPRESSION: 11/02/22: Tolerating all stretches and nerve tensions well with decreasing pain by end of session, carry on.   Eval: Patient is a 77 y.o. female who was seen today for occupational therapy evaluation for chronic left-sided tightness and pain also some paresthesia complaints and nerve sensitivity which negatively impact her daily routines.  She will benefit from outpatient occupational therapy to decrease symptoms and increase quality of life.      PLAN:  OT FREQUENCY: 2x/week  OT DURATION: 6 weeks (through 12/04/22 as needed)   PLANNED INTERVENTIONS: self care/ADL training, therapeutic exercise, therapeutic activity, neuromuscular re-education, manual therapy, passive range of motion, splinting, electrical stimulation, ultrasound, compression bandaging, moist heat, cryotherapy, contrast bath, patient/family education, energy conservation, coping strategies training, DME and/or AE instructions, and Dry needling  CONSULTED AND AGREED WITH PLAN OF CARE: Patient  PLAN FOR NEXT SESSION:  Review HEP as needed, use MH and modalities as needed, may need to focus some on tight tricep as well.    Fannie Knee, OTR/L, CHT 11/02/2022, 1:47 PM

## 2022-11-03 DIAGNOSIS — R339 Retention of urine, unspecified: Secondary | ICD-10-CM | POA: Diagnosis not present

## 2022-11-03 NOTE — Therapy (Signed)
OUTPATIENT OCCUPATIONAL THERAPY TREATMENT NOTE  Patient Name: Amy Escobar MRN: [id number] DOB:1946/01/02, 77 y.o., female Today's Date: 11/06/2022  PCP: Ria Clock, FNP REFERRING PROVIDER: Kathryne Hitch, MD   END OF SESSION:  OT End of Session - 11/06/22 0801     Visit Number 3    Number of Visits 12    Date for OT Re-Evaluation 12/04/22    Authorization Type [employer]    OT Start Time 0801    OT Stop Time 0840    OT Time Calculation (min) 39 min    Activity Tolerance Patient tolerated treatment well;Patient limited by pain;Patient limited by fatigue;No increased pain    Behavior During Therapy WFL for tasks assessed/performed               Past Medical History:  Diagnosis Date   Alkaline phosphatase deficiency    w/u Ne   Allergic rhinitis    Allergy    Anxiety    Arthritis    Cataract    BILATERAL-REMOVED   DM2 (diabetes mellitus, type 2) (HCC)    GERD (gastroesophageal reflux disease)    Gout    Hemorrhoids    Hyperlipidemia    Hypertension    Mild pulmonary hypertension (HCC)    NSVT (nonsustained ventricular tachycardia) (HCC)    Osteoporosis    PVC's (premature ventricular contractions)    Thyroid nodule    small   Tubular adenoma of colon 2017   Urticaria    Uterine fibroid    Past Surgical History:  Procedure Laterality Date   CATARACT EXTRACTION Bilateral 2016   COLONOSCOPY  2007   echocardiogram (other)  01/16/2002   POLYPECTOMY     removed tumors from foot nerves  04/1999   stress cardiolite  02/12/2006   TOENAIL EXCISION     TUBAL LIGATION     TYMPANOSTOMY TUBE PLACEMENT     Patient Active Problem List   Diagnosis Date Noted   Polyneuropathy associated with underlying disease (HCC) 08/31/2022   Type 2 diabetes mellitus with diabetic polyneuropathy, without long-term current use of insulin (HCC) 06/18/2021   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 without macular  edema, without long-term current use of insulin (HCC) 06/18/2021   Diabetes mellitus (HCC) 06/18/2021   Osteoarthritis of right AC (acromioclavicular) joint 03/06/2021   Osteoarthritis of right glenohumeral joint 03/06/2021   Foot pain, bilateral 12/26/2020   Diabetic neuropathy (HCC) 01/10/2020   Hav (hallux abducto valgus), unspecified laterality 01/10/2020   Chronic arthropathy 01/10/2020   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 edema associated with type 2 diabetes mellitus (HCC) 12/26/2019   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   AC (acromioclavicular) arthritis 08/02/2019   Unspecified inflammatory spondylopathy, 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   Claudication (HCC) 04/19/2019   Morbid obesity (HCC) 04/19/2019   Suspected COVID-19 virus infection 04/13/2019   Upper airway cough syndrome 08/22/2018   Greater trochanteric bursitis of both hips 06/15/2018   Trigger point of shoulder region, left 02/07/2018   Hypertension    Mild pulmonary hypertension (HCC)    NSVT (nonsustained ventricular tachycardia) (HCC)    PVC's (premature ventricular contractions)    URI (upper respiratory infection) 08/09/2016   Neck pain 02/10/2016   Urinary incontinence 02/10/2016   Degenerative arthritis of knee, bilateral 07/17/2015   Knee MCL sprain 06/07/2015   Discomfort in chest 02/15/2015   Chest pain 02/15/2015   DM type 2, controlled, with complication (HCC)  Wellness examination 08/06/2014   Acute meniscal tear of knee 02/13/2014   Primary localized osteoarthrosis, lower leg 02/13/2014   Gastrocnemius tear 12/25/2013   UTI (urinary tract infection) 12/20/2013   Right knee pain 12/20/2013   Pulmonary hypertension (HCC) 10/06/2013   DOE (dyspnea on exertion) 08/04/2013   Dysphagia, unspecified(787.20) 08/10/2012   Hip pain, left 02/02/2012   Painful respiration 05/25/2011   Encounter for long-term (current) use of other medications  01/30/2011   PELVIC PAIN, LEFT 07/30/2010   TOBACCO USE, QUIT 07/07/2010   FATIGUE 12/20/2009   Headache 12/20/2009   Low back pain 12/26/2008   Disorder of liver 04/13/2008   FOOT PAIN, BILATERAL 04/13/2008   FIBROIDS, UTERUS 11/24/2007   THYROID NODULE, LEFT 11/24/2007   HYPERCHOLESTEROLEMIA 11/24/2007   CARPAL TUNNEL SYNDROME, BILATERAL 11/24/2007   ALKALINE PHOSPHATASE, ELEVATED 11/24/2007   Gout 08/10/2007   ANXIETY 08/10/2007   Essential hypertension 08/10/2007   Cough 08/01/2007   Perennial and seasonal allergic rhinitis 01/14/2007   GERD 01/14/2007   Osteoporosis 01/14/2007    ONSET DATE: chronic ~1 year  REFERRING DIAG:  M79.602 (ICD-10-CM) - Left arm pain  M25.539 (ICD-10-CM) - Wrist pain    THERAPY DIAG:  Muscle weakness (generalized)  Pain in left wrist  Stiffness of left wrist, not elsewhere classified  Stiffness of left shoulder, not elsewhere classified  Rationale for Evaluation and Treatment: Rehabilitation  PERTINENT HISTORY: Per MD notes: OT left forearm and left wrist pain, " She fell about a year ago and since then has reported pain in her left elbow, forearm and wrist.  X-rays were obtained in the past of her left shoulder and left elbow and these were negative. On exam she has full range of motion of her left wrist and her left elbow with some pain with dorsiflexion of the wrist.  I cannot palpate any deformities.  She has good pulses and normal sensation.  Her elbow is ligamentously stable and the wrist is limited to stable.  It is painful. I would like to try Celebrex as an anti-inflammatory and send her to hand therapy to see if there is any modalities that can help decrease her left forearm and wrist pain.  We can then see her back after a course of therapy.  She agrees with this treatment plan." She states falling a year ago and landing on her Lt arm (elbow to hand). She states she's been having Lt sided pain and problems since. She has diabetic  neuropathy, and does complain of hand tingling/numbness at times but not at the moment. Rt arm does not bother her at the moment.  She is wearing a store-bought prefabricated neoprene wrist support brace today on Lt wrist.   PRECAUTIONS: None; WEIGHT BEARING RESTRICTIONS: No   SUBJECTIVE:   SUBJECTIVE STATEMENT: She states her pain "comes and goes," and now no pain today.    PAIN:  Are you having pain?  Yes: NPRS scale: 0/10 Pain location: dorsal forearm to dorsal wrist Pain description: tight and pulling Aggravating factors: overuse Relieving factors: pressure   PATIENT GOALS: To have less pain in left arm   OBJECTIVE: (All objective assessments below are from initial evaluation on: 10/26/22 unless otherwise specified.)   HAND DOMINANCE: Right   ADLs: Overall ADLs: States decreased ability to grab, hold household objects, pain and inability to open containers, perform FMS tasks (manipulate fasteners on clothing), mild to moderate bathing problems as well.    FUNCTIONAL OUTCOME MEASURES: Eval: Quck DASH 41% impairment today  (  Higher % Score  =  More Impairment)    UPPER EXTREMITY ROM     Shoulder to Wrist AROM Left eval Lt 11/06/22  Shoulder flexion 111   Shoulder abduction    Shoulder extension    Shoulder internal rotation 38 49  Shoulder external rotation 55 79  Elbow flexion full   Elbow extension full   Forearm supination    Forearm pronation     Wrist flexion 61 (57* Rt) 51  Wrist extension 53 (45* Rt) 52  (Blank rows = not tested)   Hand AROM Right eval Left eval  Full Fist Ability (or Gap to Distal Palmar Crease) Full but loose Full but loose  (Blank rows = not tested)   UPPER EXTREMITY MMT:     MMT Right 10/26/22 Left 10/26/22  Shoulder flexion    Shoulder abduction    Shoulder adduction    Shoulder extension    Shoulder internal rotation    Shoulder external rotation    Elbow flexion    Elbow extension    Forearm supination    Forearm  pronation    Wrist flexion    Wrist extension 5/5 4/5 tender to elbow  (Blank rows = not tested)  HAND FUNCTION: Eval: Observed weakness in affected hand.  Grip strength Right: 21.8 lbs, Left: 18.7 lbs   COORDINATION: Eval: No significant observed coordination impairments, other than pain with attempted sh motion (gross motion)  SENSATION: Eval:  Light touch intact today, though she does c/o neuropathy at times and has some signs of ulnar and possibly radial neuritis   OBSERVATIONS:   Eval: negative b/l wrist flexion test (Phalen's) and neg Tinel's at bil carpal tunnel. positive elbow flexion test on Lt arm (~7 sec), neg Tinel's @ Lt elbow and carpal tunnel, tight muscle spasms in upper trap, supraspinatus, ticeps, FA extensor wad Lt side compared to Rt side. Mild signs and c/o of Lt lateral epicondylitis   She presents as tight painful muscle spasms from the shoulder down to the dorsal forearm as well as mild left lateral epicondylitis and ulnar nerve neuritis-possibly some radial and median nerve tenderness as well partially from muscle spasms and general tightness in the left arm.  This is probably originating from guarding from pain habits and postures after her fall   TODAY'S TREATMENT:  11/06/22: She does AROM for exercise and new measures which show great improvement at her shoulder but still tight at wrist. OT then quickly review HEP with her, having her perform back any confusing exercises or ones that are more vital. OT does manual therapy IASTM and stretch s to bil wrists and FA s  for tightness there and helping manage stiffness in fingers. She states this is much better and understanding need to maintain her gains. .    11/02/22: OT puts MH on her sore Lt elbow while showing and demo'ing HEP ~5 mins. She states this feels good, and then she performs back HEP with cues from OT to slow down on stretches, for good form. She states understanding all- no added pain, but feels proper  stretches. Nerve glides also done for radial and ulnar nerve tightness. Tolerates well. Leaves in no added pain states ~4/10.   Exercises - Standing Radial Nerve Glide  - 4-6 x daily - 1 sets - 10-15 reps - Ulnar Nerve Flossing  - 3-4 x daily - 1-2 sets - 5-10 reps - Seated Scapular Retraction  - 4 x daily - 5-10 reps - Seated Shoulder  Flexion Towel Slide at Table Top  - 4 x daily - 3-5 reps - 15 hold - Seated Shoulder External Rotation PROM on Table  - 3-4 x daily - 3-5 reps - 15 sec hold - Sleeper Stretch  - 3-4 x daily - 3-5 reps - 15 hold - Standing neck/upper traps stretch  - 4-6 x daily - 3-5 reps - 15 sec hold - Wrist Flexion Stretch  - 4 x daily - 3-5 reps - 15 sec hold - Wrist Prayer Stretch  - 4 x daily - 3-5 reps - 15 sec hold - BACK KNUCKLE STRETCHES   - 4 x daily - 3-5 reps - 15 sec hold  PATIENT EDUCATION: Education details: See tx section above for details  Person educated: Patient Education method: Verbal Instruction, Teach back, Handouts  Education comprehension: States and demonstrates understanding, Additional Education required    HOME EXERCISE PROGRAM: Access Code: [email or web address] URL: [email or web address] Date: 10/26/2022 Prepared by: Fannie Knee   GOALS: Goals reviewed with patient? Yes   SHORT TERM GOALS: (STG required if POC>30 days) Target Date: 11/06/22  Pt will demo/state understanding of initial HEP to improve pain levels and prerequisite motion. Goal status: 11/06/22: MET   LONG TERM GOALS: Target Date: 12/04/22  Pt will improve functional ability by decreased impairment per Quick DASH assessment from 41% to 20% or better, for better quality of life. Goal status: INITIAL  2.  Pt will improve grip strength in b/l hands to at least 25lbs for functional use at home and in IADLs. Goal status: INITIAL  3.  Pt will improve A/ROM in Lt wrist ext from 53* to at least 60*, to have functional motion for tasks like reach and grasp.  Goal  status: INITIAL  4.  Pt will improve strength in Lt wrist ext from 4/5 tender MMT to at least 4+/5 MMT to have increased functional ability to carry out selfcare and higher-level homecare tasks with no difficulty. Goal status: INITIAL  5.  Pt will improve A/ROM in Lt Sh IR from 38* to at least 45*, to have less sh pain.  Goal status: INITIAL  6.  Pt will decrease pain at rest (in UEs) from 5/10 to 2/10 or better to have better sleep and occupational participation in daily roles. Goal status: INITIAL   ASSESSMENT:  CLINICAL IMPRESSION: 11/06/22: Doing well, no pains now, OT emphasizes to maintain with 1-2 x day stretches. Carry on   PLAN:  OT FREQUENCY: 2x/week  OT DURATION: 6 weeks (through 12/04/22 as needed)   PLANNED INTERVENTIONS: self care/ADL training, therapeutic exercise, therapeutic activity, neuromuscular re-education, manual therapy, passive range of motion, splinting, electrical stimulation, ultrasound, compression bandaging, moist heat, cryotherapy, contrast bath, patient/family education, energy conservation, coping strategies training, DME and/or AE instructions, and Dry needling  CONSULTED AND AGREED WITH PLAN OF CARE: Patient  PLAN FOR NEXT SESSION:  Check status, if she is feeling well multiple weeks in a row, she may not need as much therapy as initially expected    [employer], OTR/L, CHT 11/06/2022, 8:50 AM

## 2022-11-06 ENCOUNTER — Ambulatory Visit (INDEPENDENT_AMBULATORY_CARE_PROVIDER_SITE_OTHER): Payer: 59 | Admitting: Rehabilitative and Restorative Service Providers"

## 2022-11-06 ENCOUNTER — Encounter: Payer: Self-pay | Admitting: Rehabilitative and Restorative Service Providers"

## 2022-11-06 DIAGNOSIS — M25612 Stiffness of left shoulder, not elsewhere classified: Secondary | ICD-10-CM

## 2022-11-06 DIAGNOSIS — M25532 Pain in left wrist: Secondary | ICD-10-CM | POA: Diagnosis not present

## 2022-11-06 DIAGNOSIS — M25632 Stiffness of left wrist, not elsewhere classified: Secondary | ICD-10-CM

## 2022-11-06 DIAGNOSIS — M6281 Muscle weakness (generalized): Secondary | ICD-10-CM | POA: Diagnosis not present

## 2022-11-06 NOTE — Therapy (Signed)
OUTPATIENT OCCUPATIONAL THERAPY TREATMENT NOTE  Patient Name: Amy Escobar MRN: [id number] DOB:20-Jun-1946, 77 y.o., female Today's Date: 11/09/2022  PCP: Ria Clock, FNP REFERRING PROVIDER: Kathryne Hitch, MD   END OF SESSION:  OT End of Session - 11/09/22 0806     Visit Number 4    Number of Visits 12    Date for OT Re-Evaluation 12/04/22    Authorization Type [employer] Medicare    OT Start Time 0806    Activity Tolerance Patient tolerated treatment well;Patient limited by pain;Patient limited by fatigue;No increased pain    Behavior During Therapy WFL for tasks assessed/performed            Past Medical History:  Diagnosis Date   Alkaline phosphatase deficiency    w/u Ne   Allergic rhinitis    Allergy    Anxiety    Arthritis    Cataract    BILATERAL-REMOVED   DM2 (diabetes mellitus, type 2) (HCC)    GERD (gastroesophageal reflux disease)    Gout    Hemorrhoids    Hyperlipidemia    Hypertension    Mild pulmonary hypertension (HCC)    NSVT (nonsustained ventricular tachycardia) (HCC)    Osteoporosis    PVC's (premature ventricular contractions)    Thyroid nodule    small   Tubular adenoma of colon 2017   Urticaria    Uterine fibroid    Past Surgical History:  Procedure Laterality Date   CATARACT EXTRACTION Bilateral 2016   COLONOSCOPY  2007   echocardiogram (other)  01/16/2002   POLYPECTOMY     removed tumors from foot nerves  04/1999   stress cardiolite  02/12/2006   TOENAIL EXCISION     TUBAL LIGATION     TYMPANOSTOMY TUBE PLACEMENT     Patient Active Problem List   Diagnosis Date Noted   Polyneuropathy associated with underlying disease (HCC) 08/31/2022   Type 2 diabetes mellitus with diabetic polyneuropathy, without long-term current use of insulin (HCC) 06/18/2021   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 without macular edema, without long-term current use of insulin (HCC) 06/18/2021    Diabetes mellitus (HCC) 06/18/2021   Osteoarthritis of right AC (acromioclavicular) joint 03/06/2021   Osteoarthritis of right glenohumeral joint 03/06/2021   Foot pain, bilateral 12/26/2020   Diabetic neuropathy (HCC) 01/10/2020   Hav (hallux abducto valgus), unspecified laterality 01/10/2020   Chronic arthropathy 01/10/2020   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 edema associated with type 2 diabetes mellitus (HCC) 12/26/2019   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   AC (acromioclavicular) arthritis 08/02/2019   Unspecified inflammatory spondylopathy, 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   Claudication (HCC) 04/19/2019   Morbid obesity (HCC) 04/19/2019   Suspected COVID-19 virus infection 04/13/2019   Upper airway cough syndrome 08/22/2018   Greater trochanteric bursitis of both hips 06/15/2018   Trigger point of shoulder region, left 02/07/2018   Hypertension    Mild pulmonary hypertension (HCC)    NSVT (nonsustained ventricular tachycardia) (HCC)    PVC's (premature ventricular contractions)    URI (upper respiratory infection) 08/09/2016   Neck pain 02/10/2016   Urinary incontinence 02/10/2016   Degenerative arthritis of knee, bilateral 07/17/2015   Knee MCL sprain 06/07/2015   Discomfort in chest 02/15/2015   Chest pain 02/15/2015   DM type 2, controlled, with complication (HCC)    Wellness examination 08/06/2014   Acute meniscal tear of knee 02/13/2014   Primary localized osteoarthrosis, lower  leg 02/13/2014   Gastrocnemius tear 12/25/2013   UTI (urinary tract infection) 12/20/2013   Right knee pain 12/20/2013   Pulmonary hypertension (HCC) 10/06/2013   DOE (dyspnea on exertion) 08/04/2013   Dysphagia, unspecified(787.20) 08/10/2012   Hip pain, left 02/02/2012   Painful respiration 05/25/2011   Encounter for long-term (current) use of other medications 01/30/2011   PELVIC PAIN, LEFT 07/30/2010   TOBACCO USE, QUIT  07/07/2010   FATIGUE 12/20/2009   Headache 12/20/2009   Low back pain 12/26/2008   Disorder of liver 04/13/2008   FOOT PAIN, BILATERAL 04/13/2008   FIBROIDS, UTERUS 11/24/2007   THYROID NODULE, LEFT 11/24/2007   HYPERCHOLESTEROLEMIA 11/24/2007   CARPAL TUNNEL SYNDROME, BILATERAL 11/24/2007   ALKALINE PHOSPHATASE, ELEVATED 11/24/2007   Gout 08/10/2007   ANXIETY 08/10/2007   Essential hypertension 08/10/2007   Cough 08/01/2007   Perennial and seasonal allergic rhinitis 01/14/2007   GERD 01/14/2007   Osteoporosis 01/14/2007    ONSET DATE: chronic ~1 year  REFERRING DIAG:  M79.602 (ICD-10-CM) - Left arm pain  M25.539 (ICD-10-CM) - Wrist pain    THERAPY DIAG:  Muscle weakness (generalized)  Pain in left wrist  Stiffness of left wrist, not elsewhere classified  Stiffness of left shoulder, not elsewhere classified  Rationale for Evaluation and Treatment: Rehabilitation  PERTINENT HISTORY: Per MD notes: OT left forearm and left wrist pain, " She fell about a year ago and since then has reported pain in her left elbow, forearm and wrist.  X-rays were obtained in the past of her left shoulder and left elbow and these were negative. On exam she has full range of motion of her left wrist and her left elbow with some pain with dorsiflexion of the wrist.  I cannot palpate any deformities.  She has good pulses and normal sensation.  Her elbow is ligamentously stable and the wrist is limited to stable.  It is painful. I would like to try Celebrex as an anti-inflammatory and send her to hand therapy to see if there is any modalities that can help decrease her left forearm and wrist pain.  We can then see her back after a course of therapy.  She agrees with this treatment plan." She states falling a year ago and landing on her Lt arm (elbow to hand). She states she's been having Lt sided pain and problems since. She has diabetic neuropathy, and does complain of hand tingling/numbness at times  but not at the moment. Rt arm does not bother her at the moment.  She is wearing a store-bought prefabricated neoprene wrist support brace today on Lt wrist.   PRECAUTIONS: None; WEIGHT BEARING RESTRICTIONS: No   SUBJECTIVE:   SUBJECTIVE STATEMENT: She states still not having any significant pain. She demo's back ulnar nerve glide as her main HEP movement.   PAIN:  Are you having pain? NO: NPRS scale: 0/10    PATIENT GOALS: To have less pain in left arm   OBJECTIVE: (All objective assessments below are from initial evaluation on: 10/26/22 unless otherwise specified.)   HAND DOMINANCE: Right   ADLs: Overall ADLs: States decreased ability to grab, hold household objects, pain and inability to open containers, perform FMS tasks (manipulate fasteners on clothing), mild to moderate bathing problems as well.    FUNCTIONAL OUTCOME MEASURES: Eval: Quck DASH 41% impairment today  (Higher % Score  =  More Impairment)    UPPER EXTREMITY ROM     Shoulder to Wrist AROM Left eval Lt 11/06/22  Shoulder flexion  111   Shoulder abduction    Shoulder extension    Shoulder internal rotation 38 49  Shoulder external rotation 55 79  Elbow flexion full   Elbow extension full   Forearm supination    Forearm pronation     Wrist flexion 61 (57* Rt) 51  Wrist extension 53 (45* Rt) 52  (Blank rows = not tested)   Hand AROM Right eval Left eval  Full Fist Ability (or Gap to Distal Palmar Crease) Full but loose Full but loose  (Blank rows = not tested)   UPPER EXTREMITY MMT:     MMT Right 10/26/22 Left 10/26/22  Shoulder flexion    Shoulder abduction    Shoulder adduction    Shoulder extension    Shoulder internal rotation    Shoulder external rotation    Elbow flexion    Elbow extension    Forearm supination    Forearm pronation    Wrist flexion    Wrist extension 5/5 4/5 tender to elbow  (Blank rows = not tested)  HAND FUNCTION: Eval: Observed weakness in affected hand.   Grip strength Right: 21.8 lbs, Left: 18.7 lbs   COORDINATION: Eval: No significant observed coordination impairments, other than pain with attempted sh motion (gross motion)  SENSATION: Eval:  Light touch intact today, though she does c/o neuropathy at times and has some signs of ulnar and possibly radial neuritis   OBSERVATIONS:   Eval: negative b/l wrist flexion test (Phalen's) and neg Tinel's at bil carpal tunnel. positive elbow flexion test on Lt arm (~7 sec), neg Tinel's @ Lt elbow and carpal tunnel, tight muscle spasms in upper trap, supraspinatus, ticeps, FA extensor wad Lt side compared to Rt side. Mild signs and c/o of Lt lateral epicondylitis   She presents as tight painful muscle spasms from the shoulder down to the dorsal forearm as well as mild left lateral epicondylitis and ulnar nerve neuritis-possibly some radial and median nerve tenderness as well partially from muscle spasms and general tightness in the left arm.  This is probably originating from guarding from pain habits and postures after her fall   TODAY'S TREATMENT:  11/09/22: Stretches were reviewed, and she apparently had not been stretching shoulders passively on table. She feels this stretch in her elbow (flexion and ER stretches). Shoulder retraction, wrist stretches and MCP J stretches also performed for form/consistency.  She states non-painful but "feeling it."   At end of session, no added pain, feeling pretty good, no complaints. She was asked to keep doing these things, especially if the weather turns of if she overworks herself, etc.). She states understanding.  Exercises - Standing Radial Nerve Glide  - 4-6 x daily - 1 sets - 10-15 reps - Ulnar Nerve Flossing  - 3-4 x daily - 1-2 sets - 5-10 reps - Seated Scapular Retraction  - 4 x daily - 5-10 reps - Seated Shoulder Flexion Towel Slide at Table Top  - 4 x daily - 3-5 reps - 15 hold - Seated Shoulder External Rotation PROM on Table  - 3-4 x daily - 3-5 reps  - 15 sec hold - Sleeper Stretch  - 3-4 x daily - 3-5 reps - 15 hold - Standing neck/upper traps stretch  - 4-6 x daily - 3-5 reps - 15 sec hold - Wrist Flexion Stretch  - 4 x daily - 3-5 reps - 15 sec hold - Wrist Prayer Stretch  - 4 x daily - 3-5 reps - 15 sec hold -  BACK KNUCKLE STRETCHES   - 4 x daily - 3-5 reps - 15 sec hold  PATIENT EDUCATION: Education details: See tx section above for details  Person educated: Patient Education method: Verbal Instruction, Teach back, Handouts  Education comprehension: States and demonstrates understanding, Additional Education required    HOME EXERCISE PROGRAM: Access Code: [email or web address] URL: [email or web address] Date: 10/26/2022 Prepared by: Fannie Knee   GOALS: Goals reviewed with patient? Yes   SHORT TERM GOALS: (STG required if POC>30 days) Target Date: 11/06/22  Pt will demo/state understanding of initial HEP to improve pain levels and prerequisite motion. Goal status: 11/06/22: MET   LONG TERM GOALS: Target Date: 12/04/22  Pt will improve functional ability by decreased impairment per Quick DASH assessment from 41% to 20% or better, for better quality of life. Goal status: INITIAL  2.  Pt will improve grip strength in b/l hands to at least 25lbs for functional use at home and in IADLs. Goal status: INITIAL  3.  Pt will improve A/ROM in Lt wrist ext from 53* to at least 60*, to have functional motion for tasks like reach and grasp.  Goal status: INITIAL  4.  Pt will improve strength in Lt wrist ext from 4/5 tender MMT to at least 4+/5 MMT to have increased functional ability to carry out selfcare and higher-level homecare tasks with no difficulty. Goal status: INITIAL  5.  Pt will improve A/ROM in Lt Sh IR from 38* to at least 45*, to have less sh pain.  Goal status: INITIAL  6.  Pt will decrease pain at rest (in UEs) from 5/10 to 2/10 or better to have better sleep and occupational participation in daily  roles. Goal status: INITIAL   ASSESSMENT:  CLINICAL IMPRESSION: 11/09/22: She has had elbow soreness and pain >1 year since fall and now is not feeling pain after only 3-4 OT sessions. She was asked to not stop her program, however, as a chronic issue will likely try to return.    PLAN:  OT FREQUENCY: 2x/week  OT DURATION: 6 weeks (through 12/04/22 as needed)   PLANNED INTERVENTIONS: self care/ADL training, therapeutic exercise, therapeutic activity, neuromuscular re-education, manual therapy, passive range of motion, splinting, electrical stimulation, ultrasound, compression bandaging, moist heat, cryotherapy, contrast bath, patient/family education, energy conservation, coping strategies training, DME and/or AE instructions, and Dry needling  CONSULTED AND AGREED WITH PLAN OF CARE: Patient  PLAN FOR NEXT SESSION:  Do prog note and likely d/c if still not having symptoms. Fnl strength could also be addressed if this is a concern for her.    Fannie Knee, OTR/L, CHT 11/09/2022, 8:47 AM

## 2022-11-09 ENCOUNTER — Ambulatory Visit
Admission: RE | Admit: 2022-11-09 | Discharge: 2022-11-09 | Disposition: A | Discharge status: Home / Self Care | Payer: 59 | Source: Ambulatory Visit | Attending: Family | Admitting: Family

## 2022-11-09 ENCOUNTER — Encounter: Payer: Self-pay | Admitting: Rehabilitative and Restorative Service Providers"

## 2022-11-09 ENCOUNTER — Ambulatory Visit (INDEPENDENT_AMBULATORY_CARE_PROVIDER_SITE_OTHER): Payer: 59 | Admitting: Rehabilitative and Restorative Service Providers"

## 2022-11-09 DIAGNOSIS — M25532 Pain in left wrist: Secondary | ICD-10-CM

## 2022-11-09 DIAGNOSIS — M6281 Muscle weakness (generalized): Secondary | ICD-10-CM | POA: Diagnosis not present

## 2022-11-09 DIAGNOSIS — Z1231 Encounter for screening mammogram for malignant neoplasm of breast: Secondary | ICD-10-CM | POA: Diagnosis not present

## 2022-11-09 DIAGNOSIS — M25612 Stiffness of left shoulder, not elsewhere classified: Secondary | ICD-10-CM

## 2022-11-09 DIAGNOSIS — M25632 Stiffness of left wrist, not elsewhere classified: Secondary | ICD-10-CM

## 2022-11-10 NOTE — Progress Notes (Signed)
Cardiology Office Note:    Date:  11/18/2022   ID:  Amy Escobar, Amy Escobar [birth year], MRN [id number]  PCP:  Amy Bass, FNP  [facility] Providers Cardiologist:  Amy Sprague, MD     Referring MD: Amy Escobar,*   Chief Complaint:  Follow-up     History of Present Illness:   Amy Escobar is a 77 y.o. female with  history of hypertension, hyperlipidemia, IDDM, moderate pulmonary hypertension, moderate nonobstructive CAD, coronary CTA 2018 calcium score 220 which is 81st percentile for age and sex match control, moderate plaque in the ostial left main and mild plaque in the ostial RCA.  2D echo 03/10/2019 normal LVEF 55 to 60% borderline enlarged pulmonary artery, normal right ventricular systolic pressure.  NST was normal. He was last seen by Dr. Shari Escobar on 08/2021 and was doing well from a cardiovascular standpoint.    NST 02/2022 no ischemia EF 44%.  Patient comes in fro f/u. She wants to know why her ears are turning brown.  She needs lab work down-liver, lipids, etc. She has acid reflux and coughing with it. She denies chest pain, shortness of breath. Has lost almost 20 lbs on ozempic. She walks 30 min daily.       Past Medical History:  Diagnosis Date   Alkaline phosphatase deficiency    w/u Ne   Allergic rhinitis    Allergy    Anxiety    Arthritis    Cataract    BILATERAL-REMOVED   DM2 (diabetes mellitus, type 2) (HCC)    GERD (gastroesophageal reflux disease)    Gout    Hemorrhoids    Hyperlipidemia    Hypertension    Mild pulmonary hypertension (HCC)    NSVT (nonsustained ventricular tachycardia) (HCC)    Osteoporosis    PVC's (premature ventricular contractions)    Thyroid nodule    small   Tubular adenoma of colon 2017   Urticaria    Uterine fibroid    Current Medications: Current Meds  Medication Sig   alendronate (FOSAMAX) 70 MG tablet Take 1 tablet (70 mg total) by mouth every 7 (seven) days. Take with a full  glass of water on an empty stomach.   AMBULATORY NON FORMULARY MEDICATION Left wrist brace   aspirin EC 81 MG tablet Take 81 mg by mouth daily. Swallow whole.   Blood Glucose Monitoring Suppl (ONETOUCH VERIO FLEX SYSTEM) w/Device KIT USE TO TEST BLOOD SUGAR DAILY AS DIRECTED   calcium elemental as carbonate (ANTACID MAXIMUM) 400 MG chewable tablet Chew 1,000 mg by mouth as needed for heartburn.   carvedilol (COREG) 12.5 MG tablet TAKE 1 TABLET BY MOUTH TWO TIMES A DAY WITH A MEAL   celecoxib (CELEBREX) 200 MG capsule Take 1 capsule (200 mg total) by mouth 2 (two) times daily between meals as needed.   cholecalciferol (VITAMIN D3) 25 MCG (1000 UNIT) tablet Take 1,000 Units by mouth daily.   esomeprazole (NEXIUM) 40 MG capsule TAKE 1 CAPSULE BY MOUTH TWICE A DAY 30 TO 60 MINUTES BEFORE YOUR FIRST AND LAST MEALS OF THE DAY   famotidine (PEPCID) 20 MG tablet Take 1 tablet (20 mg total) by mouth at bedtime.   fesoterodine (TOVIAZ) 4 MG TB24 tablet Take 4 mg daily by mouth.   gabapentin (NEURONTIN) 100 MG capsule Take 1 capsule (100 mg total) by mouth at bedtime.   glucose blood (ONETOUCH VERIO) test strip USE TO CHECK BLOOD SUGAE TWO TIMES A DAY   guaiFENesin (  MUCINEX) 600 MG 12 hr tablet Take 600 mg by mouth 2 (two) times daily as needed.   montelukast (SINGULAIR) 10 MG tablet Take 1 tablet (10 mg total) by mouth daily.   Multiple Vitamins-Minerals (HAIR SKIN AND NAILS FORMULA PO) Take by mouth.   rosuvastatin (CRESTOR) 20 MG tablet TAKE ONE TABLET BY MOUTH DAILY   Semaglutide,0.25 or 0.5MG /DOS, 2 MG/3ML SOPN Inject 0.5 mg into the skin once a week.   Turmeric (QC TUMERIC COMPLEX) 500 MG CAPS Take by mouth.   valsartan (DIOVAN) 160 MG tablet Take 1 tablet (160 mg total) by mouth daily.    Allergies:   Olmesartan medoxomil   Social History   Tobacco Use   Smoking status: Former    Packs/day: 0.25    Years: 5.00    Additional pack years: 0.00    Total pack years: 1.25    Types: Cigarettes     Quit date: 06/29/1968    Years since quitting: 54.4   Smokeless tobacco: Never  Vaping Use   Vaping Use: Never used  Substance Use Topics   Alcohol use: No    Alcohol/week: 0.0 standard drinks of alcohol   Drug use: No    Family Hx: The patient's family history includes Allergic rhinitis in her mother; Heart disease in her father, maternal grandfather, and mother; Rectal cancer in her maternal grandfather; Stomach cancer in her maternal grandmother. There is no history of Colon cancer, Breast cancer, Colon polyps, or Esophageal cancer.  ROS     Physical Exam:    VS:  BP 122/78   Pulse 97   Ht 5\' 4"  (1.626 m)   Wt 155 lb 12.8 oz (70.7 kg)   SpO2 94%   BMI 26.74 kg/m     Wt Readings from Last 3 Encounters:  11/18/22 155 lb 12.8 oz (70.7 kg)  08/31/22 161 lb (73 kg)  08/26/22 161 lb 6.4 oz (73.2 kg)    Physical Exam  GEN: Well nourished, well developed, in no acute distress  Neck: no JVD, carotid bruits, or masses Cardiac:RRR; no murmurs, rubs, or gallops  Respiratory:  clear to auscultation bilaterally, normal work of breathing GI: soft, nontender, nondistended, + BS Ext: without cyanosis, clubbing, or edema,  Neuro:  Alert and Oriented x 3, Strength and sensation are intact Psych: euthymic mood, full affect        EKGs/Labs/Other Test Reviewed:    EKG:  EKG is not ordered today.    Recent Labs: 08/07/2022: ALT 15; BUN 12; Creatinine, Ser 0.81; Hemoglobin 12.5; Magnesium 1.9; Platelets 198.0; Potassium 4.2; Sodium 140   Recent Lipid Panel No results for input(s): "CHOL", "TRIG", "HDL", "VLDL", "LDLCALC", "LDLDIRECT" in the last 8760 hours.   Prior CV Studies:   NST 02/2022  Findings are consistent with no prior ischemia and no prior myocardial infarction. The study is intermediate risk based on ejection fraction.   No ST deviation was noted.   LV perfusion is normal. There is no evidence of ischemia. There is no evidence of infarction.   Left ventricular function  is abnormal. Global function is moderately reduced. There were no regional wall motion abnormalities. Nuclear stress EF: 44 %. The left ventricular ejection fraction is moderately decreased (30-44%). End diastolic cavity size is mildly enlarged. End systolic cavity size is mildly enlarged.   Prior study available for comparison from 03/10/2019.   Normal perfusion global hypokinesis worse in the apex estimated EF 44%   Echo 02/2022 IMPRESSIONS     1.  Left ventricular ejection fraction, by estimation, is 55 to 60%. The  left ventricle has normal function. The left ventricle has no regional  wall motion abnormalities. Left ventricular diastolic parameters are  indeterminate. The average left  ventricular global longitudinal strain is -20.7 %. The global longitudinal  strain is normal.   2. Right ventricular systolic function is normal. The right ventricular  size is normal. There is normal pulmonary artery systolic pressure. The  estimated right ventricular systolic pressure is 30.7 mmHg.   3. The mitral valve is normal in structure. Mild mitral valve  regurgitation. No evidence of mitral stenosis.   4. The aortic valve is tricuspid. Aortic valve regurgitation is not  visualized. No aortic stenosis is present.   5. The inferior vena cava is normal in size with greater than 50%  respiratory variability, suggesting right atrial pressure of 3 mmHg.   Comparison(s): No significant change from prior study. 03/10/19 EF 55-60%.  PA pressure .   Conclusion(s)/Recommendation(s): Otherwise normal echocardiogram, with  minor abnormalities described in the report.    Risk Assessment/Calculations/Metrics:              ASSESSMENT & PLAN:   No problem-specific Assessment & Plan notes found for this encounter.   Nonobstructive CAD: -Nonobstructive CAD diagnosed following CTA in 2018 -Patient currently on GDMT with ASA 81 mg, Crestor 20 mg - no recurrence of chest discomfort or shortness of  breath since her previous visit.   HTN: -Blood pressure well controlled -Continue carvedilol 12.5 mg twice daily, valsartan 160 mg daily    HLD: -check FLP tomorrow -Continue Crestor 20 mg daily   Pulmonary hypertension: -2D echo with updated on 03/21/2022 and showed normal heart pumping function with stable pulmonary artery pressures.    Palpitations: -no recent palpitations   DM2- requesting A1C to be checked and thyroid              Dispo:  No follow-ups on file.   Medication Adjustments/Labs and Tests Ordered: Current medicines are reviewed at length with the patient today.  Concerns regarding medicines are outlined above.  Tests Ordered: Orders Placed This Encounter  Procedures   Lipid panel   TSH   HgB A1c   Medication Changes: No orders of the defined types were placed in this encounter.  Elson Clan, PA-C  11/18/2022 12:23 PM    [facility] [address] [place], [place], [place]  [address] Phone: [phone]; Fax: [phone]

## 2022-11-11 NOTE — Therapy (Signed)
OUTPATIENT OCCUPATIONAL THERAPY TREATMENT & *** NOTE  Patient Name: Amy Escobar MRN: [id number] DOB:Jul 20, 1945, 77 y.o., female Today's Date: 11/11/2022  PCP: Ria Clock, FNP REFERRING PROVIDER: Kathryne Hitch, MD   END OF SESSION:   Past Medical History:  Diagnosis Date   Alkaline phosphatase deficiency    w/u Ne   Allergic rhinitis    Allergy    Anxiety    Arthritis    Cataract    BILATERAL-REMOVED   DM2 (diabetes mellitus, type 2) (HCC)    GERD (gastroesophageal reflux disease)    Gout    Hemorrhoids    Hyperlipidemia    Hypertension    Mild pulmonary hypertension (HCC)    NSVT (nonsustained ventricular tachycardia) (HCC)    Osteoporosis    PVC's (premature ventricular contractions)    Thyroid nodule    small   Tubular adenoma of colon 2017   Urticaria    Uterine fibroid    Past Surgical History:  Procedure Laterality Date   CATARACT EXTRACTION Bilateral 2016   COLONOSCOPY  2007   echocardiogram (other)  01/16/2002   POLYPECTOMY     removed tumors from foot nerves  04/1999   stress cardiolite  02/12/2006   TOENAIL EXCISION     TUBAL LIGATION     TYMPANOSTOMY TUBE PLACEMENT     Patient Active Problem List   Diagnosis Date Noted   Polyneuropathy associated with underlying disease (HCC) 08/31/2022   Type 2 diabetes mellitus with diabetic polyneuropathy, without long-term current use of insulin (HCC) 06/18/2021   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 without macular edema, without long-term current use of insulin (HCC) 06/18/2021   Diabetes mellitus (HCC) 06/18/2021   Osteoarthritis of right AC (acromioclavicular) joint 03/06/2021   Osteoarthritis of right glenohumeral joint 03/06/2021   Foot pain, bilateral 12/26/2020   Diabetic neuropathy (HCC) 01/10/2020   Hav (hallux abducto valgus), unspecified laterality 01/10/2020   Chronic arthropathy 01/10/2020   Moderate nonproliferative  diabetic retinopathy of both eyes without macular edema associated with type 2 diabetes mellitus (HCC) 12/26/2019   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   AC (acromioclavicular) arthritis 08/02/2019   Unspecified inflammatory spondylopathy, 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   Claudication (HCC) 04/19/2019   Morbid obesity (HCC) 04/19/2019   Suspected COVID-19 virus infection 04/13/2019   Upper airway cough syndrome 08/22/2018   Greater trochanteric bursitis of both hips 06/15/2018   Trigger point of shoulder region, left 02/07/2018   Hypertension    Mild pulmonary hypertension (HCC)    NSVT (nonsustained ventricular tachycardia) (HCC)    PVC's (premature ventricular contractions)    URI (upper respiratory infection) 08/09/2016   Neck pain 02/10/2016   Urinary incontinence 02/10/2016   Degenerative arthritis of knee, bilateral 07/17/2015   Knee MCL sprain 06/07/2015   Discomfort in chest 02/15/2015   Chest pain 02/15/2015   DM type 2, controlled, with complication (HCC)    Wellness examination 08/06/2014   Acute meniscal tear of knee 02/13/2014   Primary localized osteoarthrosis, lower leg 02/13/2014   Gastrocnemius tear 12/25/2013   UTI (urinary tract infection) 12/20/2013   Right knee pain 12/20/2013   Pulmonary hypertension (HCC) 10/06/2013   DOE (dyspnea on exertion) 08/04/2013   Dysphagia, unspecified(787.20) 08/10/2012   Hip pain, left 02/02/2012   Painful respiration 05/25/2011   Encounter for long-term (current) use of other medications 01/30/2011   PELVIC PAIN, LEFT 07/30/2010   TOBACCO USE, QUIT 07/07/2010   FATIGUE 12/20/2009  Headache 12/20/2009   Low back pain 12/26/2008   Disorder of liver 04/13/2008   FOOT PAIN, BILATERAL 04/13/2008   FIBROIDS, UTERUS 11/24/2007   THYROID NODULE, LEFT 11/24/2007   HYPERCHOLESTEROLEMIA 11/24/2007   CARPAL TUNNEL SYNDROME, BILATERAL 11/24/2007   ALKALINE PHOSPHATASE, ELEVATED 11/24/2007   Gout 08/10/2007   ANXIETY  08/10/2007   Essential hypertension 08/10/2007   Cough 08/01/2007   Perennial and seasonal allergic rhinitis 01/14/2007   GERD 01/14/2007   Osteoporosis 01/14/2007    ONSET DATE: chronic ~1 year  REFERRING DIAG:  M79.602 (ICD-10-CM) - Left arm pain  M25.539 (ICD-10-CM) - Wrist pain    THERAPY DIAG:  No diagnosis found.  Rationale for Evaluation and Treatment: Rehabilitation  PERTINENT HISTORY: Per MD notes: OT left forearm and left wrist pain, " She fell about a year ago and since then has reported pain in her left elbow, forearm and wrist.  X-rays were obtained in the past of her left shoulder and left elbow and these were negative. On exam she has full range of motion of her left wrist and her left elbow with some pain with dorsiflexion of the wrist.  I cannot palpate any deformities.  She has good pulses and normal sensation.  Her elbow is ligamentously stable and the wrist is limited to stable.  It is painful. I would like to try Celebrex as an anti-inflammatory and send her to hand therapy to see if there is any modalities that can help decrease her left forearm and wrist pain.  We can then see her back after a course of therapy.  She agrees with this treatment plan." She states falling a year ago and landing on her Lt arm (elbow to hand). She states she's been having Lt sided pain and problems since. She has diabetic neuropathy, and does complain of hand tingling/numbness at times but not at the moment. Rt arm does not bother her at the moment.  She is wearing a store-bought prefabricated neoprene wrist support brace today on Lt wrist.   PRECAUTIONS: None; WEIGHT BEARING RESTRICTIONS: No   SUBJECTIVE:   SUBJECTIVE STATEMENT: She states ***  still not having any significant pain. She demo's back ulnar nerve glide as her main HEP movement.   PAIN:  Are you having pain?***  NO: NPRS scale: 0/10   PATIENT GOALS: To have less pain in left arm   OBJECTIVE: (All objective  assessments below are from initial evaluation on: 10/26/22 unless otherwise specified.)   HAND DOMINANCE: Right   ADLs: Overall ADLs: *** States decreased ability to grab, hold household objects, pain and inability to open containers, perform FMS tasks (manipulate fasteners on clothing), mild to moderate bathing problems as well.    FUNCTIONAL OUTCOME MEASURES: 11/12/22: Nadean Corwin DASH ***% impairment today  Eval: Quck DASH 41% impairment today  (Higher % Score  =  More Impairment)    UPPER EXTREMITY ROM     Shoulder to Wrist AROM Left eval Lt 11/06/22 Lt 11/12/22  Shoulder flexion 111  ***  Shoulder abduction     Shoulder extension     Shoulder internal rotation 38 49 ***  Shoulder external rotation 55 79 ***  Elbow flexion full    Elbow extension full    Forearm supination     Forearm pronation      Wrist flexion 61 (57* Rt) 51 ***  Wrist extension 53 (45* Rt) 52 ***  (Blank rows = not tested)   Hand AROM Right eval Left eval  Full Fist  Ability (or Gap to Distal Palmar Crease) Full but loose Full but loose  (Blank rows = not tested)   UPPER EXTREMITY MMT:     MMT Right 10/26/22 Left 10/26/22 Lt 11/12/22  Shoulder internal rotation     Shoulder external rotation     Elbow flexion     Elbow extension     Forearm supination     Forearm pronation     Wrist flexion   ***  Wrist extension 5/5 4/5 tender to elbow ***  (Blank rows = not tested)  HAND FUNCTION: 11/12/22: Lt grip: ***#   Eval: Observed weakness in affected hand.  Grip strength Right: 21.8 lbs, Left: 18.7 lbs    OBSERVATIONS:   Eval: negative b/l wrist flexion test (Phalen's) and neg Tinel's at bil carpal tunnel. positive elbow flexion test on Lt arm (~7 sec), neg Tinel's @ Lt elbow and carpal tunnel, tight muscle spasms in upper trap, supraspinatus, ticeps, FA extensor wad Lt side compared to Rt side. Mild signs and c/o of Lt lateral epicondylitis   She presents as tight painful muscle spasms from the  shoulder down to the dorsal forearm as well as mild left lateral epicondylitis and ulnar nerve neuritis-possibly some radial and median nerve tenderness as well partially from muscle spasms and general tightness in the left arm.  This is probably originating from guarding from pain habits and postures after her fall   TODAY'S TREATMENT:  11/12/22: *** Do prog note and likely d/c if still not having symptoms. Fnl strength could also be addressed if this is a concern for her.    11/09/22: Stretches were reviewed, and she apparently had not been stretching shoulders passively on table. She feels this stretch in her elbow (flexion and ER stretches). Shoulder retraction, wrist stretches and MCP J stretches also performed for form/consistency.  She states non-painful but "feeling it."   At end of session, no added pain, feeling pretty good, no complaints. She was asked to keep doing these things, especially if the weather turns of if she overworks herself, etc.). She states understanding.  Exercises - Standing Radial Nerve Glide  - 4-6 x daily - 1 sets - 10-15 reps - Ulnar Nerve Flossing  - 3-4 x daily - 1-2 sets - 5-10 reps - Seated Scapular Retraction  - 4 x daily - 5-10 reps - Seated Shoulder Flexion Towel Slide at Table Top  - 4 x daily - 3-5 reps - 15 hold - Seated Shoulder External Rotation PROM on Table  - 3-4 x daily - 3-5 reps - 15 sec hold - Sleeper Stretch  - 3-4 x daily - 3-5 reps - 15 hold - Standing neck/upper traps stretch  - 4-6 x daily - 3-5 reps - 15 sec hold - Wrist Flexion Stretch  - 4 x daily - 3-5 reps - 15 sec hold - Wrist Prayer Stretch  - 4 x daily - 3-5 reps - 15 sec hold - BACK KNUCKLE STRETCHES   - 4 x daily - 3-5 reps - 15 sec hold  PATIENT EDUCATION: Education details: See tx section above for details  Person educated: Patient Education method: Verbal Instruction, Teach back, Handouts  Education comprehension: States and demonstrates understanding, Additional Education  required    HOME EXERCISE PROGRAM: Access Code: [email or web address] URL: [email or web address] Date: 10/26/2022 Prepared by: Fannie Knee   GOALS: Goals reviewed with patient? Yes   SHORT TERM GOALS: (STG required if POC>30 days) Target Date: 11/06/22  Pt will demo/state understanding of  initial HEP to improve pain levels and prerequisite motion. Goal status: 11/06/22: MET   LONG TERM GOALS: Target Date: 12/04/22  Pt will improve functional ability by decreased impairment per Quick DASH assessment from 41% to 20% or better, for better quality of life. Goal status: 11/12/22: ***  2.  Pt will improve grip strength in b/l hands to at least 25lbs for functional use at home and in IADLs. Goal status: 11/12/22: ***  3.  Pt will improve A/ROM in Lt wrist ext from 53* to at least 60*, to have functional motion for tasks like reach and grasp.  Goal status: 11/12/22: ***  4.  Pt will improve strength in Lt wrist ext from 4/5 tender MMT to at least 4+/5 MMT to have increased functional ability to carry out selfcare and higher-level homecare tasks with no difficulty. Goal status: 11/12/22: ***  5.  Pt will improve A/ROM in Lt Sh IR from 38* to at least 45*, to have less sh pain.  Goal status: 11/12/22: ***  6.  Pt will decrease pain at rest (in UEs) from 5/10 to 2/10 or better to have better sleep and occupational participation in daily roles. Goal status: 11/12/22: ***   ASSESSMENT:  CLINICAL IMPRESSION: 11/12/22: ***  11/09/22: She has had elbow soreness and pain >1 year since fall and now is not feeling pain after only 3-4 OT sessions. She was asked to not stop her program, however, as a chronic issue will likely try to return.    PLAN:  OT FREQUENCY: 2x/week  OT DURATION: 6 weeks (through 12/04/22 as needed)   PLANNED INTERVENTIONS: self care/ADL training, therapeutic exercise, therapeutic activity, neuromuscular re-education, manual therapy, passive range of motion,  splinting, electrical stimulation, ultrasound, compression bandaging, moist heat, cryotherapy, contrast bath, patient/family education, energy conservation, coping strategies training, DME and/or AE instructions, and Dry needling  CONSULTED AND AGREED WITH PLAN OF CARE: Patient  PLAN FOR NEXT SESSION:  ***   Fannie Knee, OTR/L, CHT 11/11/2022, 8:03 AM

## 2022-11-12 ENCOUNTER — Encounter: Payer: Self-pay | Admitting: Rehabilitative and Restorative Service Providers"

## 2022-11-12 ENCOUNTER — Ambulatory Visit (INDEPENDENT_AMBULATORY_CARE_PROVIDER_SITE_OTHER): Payer: 59 | Admitting: Rehabilitative and Restorative Service Providers"

## 2022-11-12 DIAGNOSIS — M25532 Pain in left wrist: Secondary | ICD-10-CM | POA: Diagnosis not present

## 2022-11-12 DIAGNOSIS — M6281 Muscle weakness (generalized): Secondary | ICD-10-CM

## 2022-11-12 DIAGNOSIS — M25612 Stiffness of left shoulder, not elsewhere classified: Secondary | ICD-10-CM | POA: Diagnosis not present

## 2022-11-12 DIAGNOSIS — M25632 Stiffness of left wrist, not elsewhere classified: Secondary | ICD-10-CM

## 2022-11-16 ENCOUNTER — Encounter: Payer: 59 | Admitting: Rehabilitative and Restorative Service Providers"

## 2022-11-16 ENCOUNTER — Ambulatory Visit: Payer: 59 | Admitting: Orthopaedic Surgery

## 2022-11-16 ENCOUNTER — Telehealth: Payer: Self-pay | Admitting: Rehabilitative and Restorative Service Providers"

## 2022-11-16 NOTE — Telephone Encounter (Signed)
OT called patient to discuss missed appointment. She was under the impression that he next was this Thursday, and there just seems to have been a mixup.  She will be seen this Thurs at 8am. Sh is doing well, doing her exercises and had a good church service yesterday- though she does have a headache today. She was recommended to try gentle head/neck AROM to improve headache.

## 2022-11-18 ENCOUNTER — Ambulatory Visit: Payer: 59 | Attending: Cardiology | Admitting: Physician Assistant

## 2022-11-18 ENCOUNTER — Encounter: Payer: Self-pay | Admitting: Physician Assistant

## 2022-11-18 VITALS — BP 122/78 | HR 97 | Ht 64.0 in | Wt 155.8 lb

## 2022-11-18 DIAGNOSIS — I2510 Atherosclerotic heart disease of native coronary artery without angina pectoris: Secondary | ICD-10-CM

## 2022-11-18 DIAGNOSIS — R002 Palpitations: Secondary | ICD-10-CM | POA: Diagnosis not present

## 2022-11-18 DIAGNOSIS — I272 Pulmonary hypertension, unspecified: Secondary | ICD-10-CM | POA: Diagnosis not present

## 2022-11-18 DIAGNOSIS — I10 Essential (primary) hypertension: Secondary | ICD-10-CM | POA: Diagnosis not present

## 2022-11-18 DIAGNOSIS — E113393 Type 2 diabetes mellitus with moderate nonproliferative diabetic retinopathy without macular edema, bilateral: Secondary | ICD-10-CM | POA: Diagnosis not present

## 2022-11-18 DIAGNOSIS — E7849 Other hyperlipidemia: Secondary | ICD-10-CM

## 2022-11-18 DIAGNOSIS — H35413 Lattice degeneration of retina, bilateral: Secondary | ICD-10-CM | POA: Diagnosis not present

## 2022-11-18 NOTE — Patient Instructions (Addendum)
Medication Instructions:  Your physician recommends that you continue on your current medications as directed. Please refer to the Current Medication list given to you today. *If you need a refill on your cardiac medications before your next appointment, please call your pharmacy*   Lab Work: TOMORROW FASTING-LIPIDS, TSH & A1C If you have labs (blood work) drawn today and your tests are completely normal, you will receive your results only by: MyChart Message (if you have MyChart) OR A paper copy in the mail If you have any lab test that is abnormal or we need to change your treatment, we will call you to review the results.   Testing/Procedures: NONE ORDERED   Follow-Up: At [facility], you and your health needs are our priority.  As part of our continuing mission to provide you with exceptional heart care, we have created designated Provider Care Teams.  These Care Teams include your primary Cardiologist (physician) and Advanced Practice Providers (APPs -  Physician Assistants and Nurse Practitioners) who all work together to provide you with the care you need, when you need it.  We recommend signing up for the patient portal called "MyChart".  Sign up information is provided on this After Visit Summary.  MyChart is used to connect with patients for Virtual Visits (Telemedicine).  Patients are able to view lab/test results, encounter notes, upcoming appointments, etc.  Non-urgent messages can be sent to your provider as well.   To learn more about what you can do with MyChart, go to [email or web address].    Your next appointment:   6 month(s)  Provider:   Meriam Sprague, MD     Other Instructions

## 2022-11-18 NOTE — Therapy (Signed)
OUTPATIENT OCCUPATIONAL THERAPY TREATMENT & DISCHARGE NOTE  Patient Name: Amy Escobar MRN: [id number] DOB:02/06/1946, 77 y.o., female Today's Date: 11/19/2022  PCP: Ria Clock, FNP REFERRING PROVIDER: Kathryne Hitch, MD   END OF SESSION:  OT End of Session - 11/19/22 0801     Visit Number 6    Number of Visits 12    Date for OT Re-Evaluation 12/04/22    Authorization Type [employer]    OT Start Time 0801    OT Stop Time 0845    OT Time Calculation (min) 44 min    Activity Tolerance Patient tolerated treatment well;No increased pain    Behavior During Therapy WFL for tasks assessed/performed             Past Medical History:  Diagnosis Date   Alkaline phosphatase deficiency    w/u Ne   Allergic rhinitis    Allergy    Anxiety    Arthritis    Cataract    BILATERAL-REMOVED   DM2 (diabetes mellitus, type 2) (HCC)    GERD (gastroesophageal reflux disease)    Gout    Hemorrhoids    Hyperlipidemia    Hypertension    Mild pulmonary hypertension (HCC)    NSVT (nonsustained ventricular tachycardia) (HCC)    Osteoporosis    PVC's (premature ventricular contractions)    Thyroid nodule    small   Tubular adenoma of colon 2017   Urticaria    Uterine fibroid    Past Surgical History:  Procedure Laterality Date   CATARACT EXTRACTION Bilateral 2016   COLONOSCOPY  2007   echocardiogram (other)  01/16/2002   POLYPECTOMY     removed tumors from foot nerves  04/1999   stress cardiolite  02/12/2006   TOENAIL EXCISION     TUBAL LIGATION     TYMPANOSTOMY TUBE PLACEMENT     Patient Active Problem List   Diagnosis Date Noted   Polyneuropathy associated with underlying disease (HCC) 08/31/2022   Type 2 diabetes mellitus with diabetic polyneuropathy, without long-term current use of insulin (HCC) 06/18/2021   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 without macular edema, without long-term current use of  insulin (HCC) 06/18/2021   Diabetes mellitus (HCC) 06/18/2021   Osteoarthritis of right AC (acromioclavicular) joint 03/06/2021   Osteoarthritis of right glenohumeral joint 03/06/2021   Foot pain, bilateral 12/26/2020   Diabetic neuropathy (HCC) 01/10/2020   Hav (hallux abducto valgus), unspecified laterality 01/10/2020   Chronic arthropathy 01/10/2020   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 edema associated with type 2 diabetes mellitus (HCC) 12/26/2019   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   AC (acromioclavicular) arthritis 08/02/2019   Unspecified inflammatory spondylopathy, 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   Claudication (HCC) 04/19/2019   Morbid obesity (HCC) 04/19/2019   Suspected COVID-19 virus infection 04/13/2019   Upper airway cough syndrome 08/22/2018   Greater trochanteric bursitis of both hips 06/15/2018   Trigger point of shoulder region, left 02/07/2018   Hypertension    Mild pulmonary hypertension (HCC)    NSVT (nonsustained ventricular tachycardia) (HCC)    PVC's (premature ventricular contractions)    URI (upper respiratory infection) 08/09/2016   Neck pain 02/10/2016   Urinary incontinence 02/10/2016   Degenerative arthritis of knee, bilateral 07/17/2015   Knee MCL sprain 06/07/2015   Discomfort in chest 02/15/2015   Chest pain 02/15/2015   DM type 2, controlled, with complication (HCC)    Wellness examination 08/06/2014  Acute meniscal tear of knee 02/13/2014   Primary localized osteoarthrosis, lower leg 02/13/2014   Gastrocnemius tear 12/25/2013   UTI (urinary tract infection) 12/20/2013   Right knee pain 12/20/2013   Pulmonary hypertension (HCC) 10/06/2013   DOE (dyspnea on exertion) 08/04/2013   Dysphagia, unspecified(787.20) 08/10/2012   Hip pain, left 02/02/2012   Painful respiration 05/25/2011   Encounter for long-term (current) use of other medications 01/30/2011   PELVIC PAIN, LEFT 07/30/2010    TOBACCO USE, QUIT 07/07/2010   FATIGUE 12/20/2009   Headache 12/20/2009   Low back pain 12/26/2008   Disorder of liver 04/13/2008   FOOT PAIN, BILATERAL 04/13/2008   FIBROIDS, UTERUS 11/24/2007   THYROID NODULE, LEFT 11/24/2007   HYPERCHOLESTEROLEMIA 11/24/2007   CARPAL TUNNEL SYNDROME, BILATERAL 11/24/2007   ALKALINE PHOSPHATASE, ELEVATED 11/24/2007   Gout 08/10/2007   ANXIETY 08/10/2007   Essential hypertension 08/10/2007   Cough 08/01/2007   Perennial and seasonal allergic rhinitis 01/14/2007   GERD 01/14/2007   Osteoporosis 01/14/2007    ONSET DATE: chronic ~1 year  REFERRING DIAG:  M79.602 (ICD-10-CM) - Left arm pain  M25.539 (ICD-10-CM) - Wrist pain    THERAPY DIAG:  Muscle weakness (generalized)  Pain in left wrist  Stiffness of left wrist, not elsewhere classified  Localized edema  Stiffness of left shoulder, not elsewhere classified  Rationale for Evaluation and Treatment: Rehabilitation  PERTINENT HISTORY: Per MD notes: OT left forearm and left wrist pain, " She fell about a year ago and since then has reported pain in her left elbow, forearm and wrist.  X-rays were obtained in the past of her left shoulder and left elbow and these were negative. On exam she has full range of motion of her left wrist and her left elbow with some pain with dorsiflexion of the wrist.  I cannot palpate any deformities.  She has good pulses and normal sensation.  Her elbow is ligamentously stable and the wrist is limited to stable.  It is painful. I would like to try Celebrex as an anti-inflammatory and send her to hand therapy to see if there is any modalities that can help decrease her left forearm and wrist pain.  We can then see her back after a course of therapy.  She agrees with this treatment plan." She states falling a year ago and landing on her Lt arm (elbow to hand). She states she's been having Lt sided pain and problems since. She has diabetic neuropathy, and does complain  of hand tingling/numbness at times but not at the moment. Rt arm does not bother her at the moment.  She is wearing a store-bought prefabricated neoprene wrist support brace today on Lt wrist.   PRECAUTIONS: None; WEIGHT BEARING RESTRICTIONS: No   SUBJECTIVE:   SUBJECTIVE STATEMENT: She states running around for different doctor's appts yesterday and feeling well.    PAIN:  Are you having pain?  NO: NPRS scale: 0/10 in upper body    PATIENT GOALS: To have less pain in left arm   OBJECTIVE: (All objective assessments below are from initial evaluation on: 10/26/22 unless otherwise specified.)   HAND DOMINANCE: Right   ADLs: Overall ADLs:  States only minor issues with a couple tougher functional tasks now.     FUNCTIONAL OUTCOME MEASURES: 11/19/22: Quick DASH: 11% Impairment today  Eval: Quck DASH 41% impairment today  (Higher % Score  =  More Impairment)    UPPER EXTREMITY ROM     Shoulder to Wrist AROM Left eval  Lt 11/06/22 Lt 11/12/22 Lt 11/19/22  Shoulder flexion 111  119 (121 in Rt)  125*  Shoulder abduction      Shoulder extension      Shoulder internal rotation 38 49 37 18  Shoulder external rotation 55 79 67 79  Elbow flexion full     Elbow extension full     Forearm supination      Forearm pronation       Wrist flexion 61 (57* Rt) 51  48  Wrist extension 53 (45* Rt) 52  40  (Blank rows = not tested)   Hand AROM Right eval Left eval  Full Fist Ability (or Gap to Distal Palmar Crease) Full but loose Full but loose  (Blank rows = not tested)   UPPER EXTREMITY MMT:     MMT Right 10/26/22 Left 10/26/22 Lt 11/19/22  Wrist extension 5/5 4/5 tender to elbow 4+/5 MMT  (Blank rows = not tested)  HAND FUNCTION: 11/19/22: Grip, Lt hand: 26#   Eval: Observed weakness in affected hand.  Grip strength Right: 21.8 lbs, Left: 18.7 lbs    OBSERVATIONS:   11/19/22: Still has some sh IR tightness, but is asymptomatic now in Lt sh, elbow, but was cautioned to keep  on HEP to keep away stiffness. No signs of lat epicondylitis    TODAY'S TREATMENT:  11/19/22: Pt performs AROM, gripping, and strength with Lt arm against therapist's resistance for exercise/activities as well as new measures today. OT also discusses home and functional tasks with the pt and reviews goals. Using the complied data, OT also reviews home exercises and provides updated recommendations as below.  She performs the most important stretches together with OT today. Pt states understanding and tolerates everything well.    Exercises - Standing Radial Nerve Glide  - 4-6 x daily - 1 sets - 10-15 reps - Seated Scapular Retraction  - 4 x daily - 5-10 reps - Seated Shoulder Flexion Towel Slide at Table Top  - 4 x daily - 3-5 reps - 15 hold - Seated Shoulder External Rotation PROM on Table  - 3-4 x daily - 3-5 reps - 15 sec hold - Sleeper Stretch  - 3-4 x daily - 3-5 reps - 15 hold - Standing neck/upper traps stretch  - 4-6 x daily - 3-5 reps - 15 sec hold - Wrist Flexion Stretch  - 4 x daily - 3-5 reps - 15 sec hold - Wrist Prayer Stretch  - 4 x daily - 3-5 reps - 15 sec hold   PATIENT EDUCATION: Education details: See tx section above for details  Person educated: Patient Education method: Verbal Instruction, Teach back, Handouts  Education comprehension: States and demonstrates understanding, Additional Education required    HOME EXERCISE PROGRAM: Access Code: [email or web address] URL: [email or web address] Date: 10/26/2022 Prepared by: Fannie Knee   GOALS: Goals reviewed with patient? Yes   SHORT TERM GOALS: (STG required if POC>30 days) Target Date: 11/06/22  Pt will demo/state understanding of initial HEP to improve pain levels and prerequisite motion. Goal status: 11/06/22: MET   LONG TERM GOALS: Target Date: 12/04/22  Pt will improve functional ability by decreased impairment per Quick DASH assessment from 41% to 20% or better, for better quality of life. Goal  status: 11/19/22: MET 11% now   2.  Pt will improve grip strength in b/l hands to at least 25lbs for functional use at home and in IADLs. Goal status: 11/19/22: MET  3.  Pt will improve A/ROM in Lt wrist  ext from 53* to at least 60*, to have functional motion for tasks like reach and grasp.  Goal status: 11/19/22: NOT MET  4.  Pt will improve strength in Lt wrist ext from 4/5 tender MMT to at least 4+/5 MMT to have increased functional ability to carry out selfcare and higher-level homecare tasks with no difficulty. Goal status: 11/19/22: MET  5.  Pt will improve A/ROM in Lt Sh IR from 38* to at least 45*, to have less sh pain.  Goal status: 11/19/22: NOT MET  6.  Pt will decrease pain at rest (in UEs) from 5/10 to 2/10 or better to have better sleep and occupational participation in daily roles. Goal status: 11/19/22: MET   ASSESSMENT:  CLINICAL IMPRESSION: 11/19/22: She has had no significant pain or functional problems in the past 2 weeks now, has no new complaints.  While she has allowed her internal rotation to get a little tight again as well as at the wrist, OT reviews her home exercises and emphasized that she must do these now and then to keep herself out of stiffness and pain.  She states understanding what to do and is in agreement with discharge today.  PLAN:  OT FREQUENCY & OT DURATION: D/C now  PLANNED INTERVENTIONS: self care/ADL training, therapeutic exercise, therapeutic activity, neuromuscular re-education, manual therapy, passive range of motion, splinting, electrical stimulation, ultrasound, compression bandaging, moist heat, cryotherapy, contrast bath, patient/family education, energy conservation, coping strategies training, DME and/or AE instructions, and Dry needling  CONSULTED AND AGREED WITH PLAN OF CARE: Patient  PLAN FOR NEXT SESSION:  Successful discharge today   Fannie Knee, OTR/L, CHT 11/19/2022, 9:37 AM     OCCUPATIONAL THERAPY DISCHARGE  SUMMARY  Visits from Start of Care: 6  Current functional level related to goals / functional outcomes: Pt has met most goals to satisfactory levels and is pleased with outcomes.   Remaining deficits: Pt has no more significant functional deficits or pain.   Education / Equipment: Pt has all needed materials and education. Pt understands how to continue on with self-management. See tx notes for more details.   Patient agrees to discharge due to max benefits received from outpatient occupational therapy / hand therapy at this time.   Fannie Knee, OTR/L, CHT 11/19/22

## 2022-11-19 ENCOUNTER — Ambulatory Visit (INDEPENDENT_AMBULATORY_CARE_PROVIDER_SITE_OTHER): Payer: 59 | Admitting: Rehabilitative and Restorative Service Providers"

## 2022-11-19 ENCOUNTER — Encounter: Payer: Self-pay | Admitting: Rehabilitative and Restorative Service Providers"

## 2022-11-19 ENCOUNTER — Ambulatory Visit: Payer: 59 | Attending: Physician Assistant

## 2022-11-19 DIAGNOSIS — M25632 Stiffness of left wrist, not elsewhere classified: Secondary | ICD-10-CM

## 2022-11-19 DIAGNOSIS — R002 Palpitations: Secondary | ICD-10-CM

## 2022-11-19 DIAGNOSIS — I10 Essential (primary) hypertension: Secondary | ICD-10-CM

## 2022-11-19 DIAGNOSIS — M25532 Pain in left wrist: Secondary | ICD-10-CM

## 2022-11-19 DIAGNOSIS — I2510 Atherosclerotic heart disease of native coronary artery without angina pectoris: Secondary | ICD-10-CM

## 2022-11-19 DIAGNOSIS — M25612 Stiffness of left shoulder, not elsewhere classified: Secondary | ICD-10-CM | POA: Diagnosis not present

## 2022-11-19 DIAGNOSIS — R600 Localized edema: Secondary | ICD-10-CM | POA: Diagnosis not present

## 2022-11-19 DIAGNOSIS — E7849 Other hyperlipidemia: Secondary | ICD-10-CM | POA: Diagnosis not present

## 2022-11-19 DIAGNOSIS — I272 Pulmonary hypertension, unspecified: Secondary | ICD-10-CM

## 2022-11-19 DIAGNOSIS — Z79899 Other long term (current) drug therapy: Secondary | ICD-10-CM | POA: Diagnosis not present

## 2022-11-19 DIAGNOSIS — M6281 Muscle weakness (generalized): Secondary | ICD-10-CM

## 2022-11-20 LAB — LIPID PANEL
Chol/HDL Ratio: 3.1 ratio (ref 0.0–4.4)
Cholesterol, Total: 116 mg/dL (ref 100–199)
HDL: 37 mg/dL — ABNORMAL LOW (ref >39)
LDL Chol Calc (NIH): 58 mg/dL (ref 0–99)
Triglycerides: 112 mg/dL (ref 0–149)
VLDL Cholesterol Cal: 21 mg/dL (ref 5–40)

## 2022-11-20 LAB — TSH: TSH: 2.1 u[IU]/mL (ref 0.450–4.500)

## 2022-11-20 LAB — HEMOGLOBIN A1C
Est. average glucose Bld gHb Est-mCnc: 114 mg/dL
Hgb A1c MFr Bld: 5.6 % (ref 4.8–5.6)

## 2022-11-24 ENCOUNTER — Ambulatory Visit (INDEPENDENT_AMBULATORY_CARE_PROVIDER_SITE_OTHER): Payer: 59 | Admitting: Family

## 2022-11-24 VITALS — BP 114/62 | HR 85 | Ht 64.0 in | Wt 154.8 lb

## 2022-11-24 DIAGNOSIS — R239 Unspecified skin changes: Secondary | ICD-10-CM

## 2022-11-24 DIAGNOSIS — M810 Age-related osteoporosis without current pathological fracture: Secondary | ICD-10-CM

## 2022-11-24 MED ORDER — HYDROCORTISONE 2.5 % EX CREA: TOPICAL_CREAM | Freq: Two times a day (BID) | CUTANEOUS | 0 refills | Status: DC

## 2022-11-24 NOTE — Patient Instructions (Signed)
Please read the information about Prolia- this will help strengthen your bones. Please let us know if you want to start this medication.

## 2022-11-24 NOTE — Progress Notes (Signed)
Amy Escobar is a 77 y.o. 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 without macular edema, without long-term current use of insulin (HCC) 06/18/2021   Diabetes mellitus (HCC) 06/18/2021   Osteoarthritis of right AC (acromioclavicular) joint 03/06/2021   Osteoarthritis of right glenohumeral joint 03/06/2021   Foot pain, bilateral 12/26/2020   Diabetic neuropathy (HCC) 01/10/2020   Hav (hallux abducto valgus), unspecified laterality 01/10/2020   Chronic arthropathy 01/10/2020   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 edema associated with type 2 diabetes mellitus (HCC) 12/26/2019   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   AC (acromioclavicular) arthritis 08/02/2019   Unspecified inflammatory spondylopathy, 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   Claudication (HCC) 04/19/2019   Morbid obesity (HCC) 04/19/2019   Suspected COVID-19 virus infection 04/13/2019   Upper airway cough syndrome 08/22/2018   Greater trochanteric bursitis of both hips 06/15/2018   Trigger point of shoulder region, left 02/07/2018   Hypertension    Mild pulmonary hypertension (HCC)    NSVT (nonsustained ventricular tachycardia) (HCC)    PVC's (premature ventricular contractions)    URI (upper respiratory infection) 08/09/2016   Neck pain 02/10/2016   Urinary incontinence 02/10/2016   Degenerative arthritis of knee, bilateral 07/17/2015   Knee MCL sprain 06/07/2015   Discomfort in chest 02/15/2015   Chest pain 02/15/2015   DM type 2, controlled, with complication (HCC)    Wellness examination 08/06/2014   Acute meniscal tear of  knee 02/13/2014   Primary localized osteoarthrosis, lower leg 02/13/2014   Gastrocnemius tear 12/25/2013   UTI (urinary tract infection) 12/20/2013   Right knee pain 12/20/2013   Pulmonary hypertension (HCC) 10/06/2013   DOE (dyspnea on exertion) 08/04/2013   Dysphagia, unspecified(787.20) 08/10/2012   Hip pain, left 02/02/2012   Painful respiration 05/25/2011   Encounter for long-term (current) use of other medications 01/30/2011   PELVIC PAIN, LEFT 07/30/2010   TOBACCO USE, QUIT 07/07/2010   FATIGUE 12/20/2009   Headache 12/20/2009   Low back pain 12/26/2008   Disorder of liver 04/13/2008   FOOT PAIN, BILATERAL 04/13/2008   FIBROIDS, UTERUS 11/24/2007   THYROID NODULE, LEFT 11/24/2007   HYPERCHOLESTEROLEMIA 11/24/2007   CARPAL TUNNEL SYNDROME, BILATERAL 11/24/2007   ALKALINE PHOSPHATASE, ELEVATED 11/24/2007   Gout 08/10/2007   ANXIETY 08/10/2007   Essential hypertension 08/10/2007   Cough 08/01/2007   Perennial and seasonal allergic rhinitis 01/14/2007   GERD 01/14/2007   Osteoporosis 01/14/2007    Current Outpatient Medications  Medication Sig Dispense Refill   AMBULATORY NON FORMULARY MEDICATION Left wrist brace 1 Piece 0   aspirin EC 81 MG tablet Take 81 mg by mouth daily. Swallow whole.     Blood Glucose Monitoring Suppl (ONETOUCH VERIO FLEX SYSTEM) w/Device KIT USE TO TEST BLOOD SUGAR DAILY AS DIRECTED 1 kit 0   calcium elemental as carbonate (ANTACID MAXIMUM) 400 MG chewable tablet Chew 1,000 mg by mouth as needed for heartburn.     carvedilol (COREG) 12.5 MG tablet TAKE 1 TABLET BY MOUTH TWO TIMES A DAY WITH A MEAL 180 tablet 2   celecoxib (CELEBREX) 200 MG capsule Take 1 capsule (200 mg total) by mouth 2 (two) times daily between meals as needed.  60 capsule 1   cholecalciferol (VITAMIN D3) 25 MCG (1000 UNIT) tablet Take 1,000 Units by mouth daily.     esomeprazole (NEXIUM) 40 MG capsule TAKE 1 CAPSULE BY MOUTH TWICE A DAY 30 TO 60 MINUTES BEFORE YOUR FIRST AND LAST  MEALS OF THE DAY 180 capsule 0   famotidine (PEPCID) 20 MG tablet Take 1 tablet (20 mg total) by mouth at bedtime. 90 tablet 2   fesoterodine (TOVIAZ) 4 MG TB24 tablet Take 4 mg daily by mouth.     gabapentin (NEURONTIN) 100 MG capsule Take 1 capsule (100 mg total) by mouth at bedtime. 90 capsule 3   glucose blood (ONETOUCH VERIO) test strip USE TO CHECK BLOOD SUGAE TWO TIMES A DAY 100 strip 3   guaiFENesin (MUCINEX) 600 MG 12 hr tablet Take 600 mg by mouth 2 (two) times daily as needed.     hydrocortisone 2.5 % cream Apply topically 2 (two) times daily. 30 g 0   montelukast (SINGULAIR) 10 MG tablet Take 1 tablet (10 mg total) by mouth daily. 90 tablet 2   Multiple Vitamins-Minerals (HAIR SKIN AND NAILS FORMULA PO) Take by mouth.     rosuvastatin (CRESTOR) 20 MG tablet TAKE ONE TABLET BY MOUTH DAILY 90 tablet 2   Semaglutide,0.25 or 0.5MG /DOS, 2 MG/3ML SOPN Inject 0.5 mg into the skin once a week. 6 mL 3   Turmeric (QC TUMERIC COMPLEX) 500 MG CAPS Take by mouth.     valsartan (DIOVAN) 160 MG tablet Take 1 tablet (160 mg total) by mouth daily. 90 tablet 3   No current facility-administered medications for this visit.    Allergies: Olmesartan medoxomil  Past Medical History:  Diagnosis Date   Alkaline phosphatase deficiency    w/u Ne   Allergic rhinitis    Allergy    Anxiety    Arthritis    Cataract    BILATERAL-REMOVED   DM2 (diabetes mellitus, type 2) (HCC)    GERD (gastroesophageal reflux disease)    Gout    Hemorrhoids    Hyperlipidemia    Hypertension    Mild pulmonary hypertension (HCC)    NSVT (nonsustained ventricular tachycardia) (HCC)    Osteoporosis    PVC's (premature ventricular contractions)    Thyroid nodule    small   Tubular adenoma of colon 2017   Urticaria    Uterine fibroid     Past Surgical History:  Procedure Laterality Date   CATARACT EXTRACTION Bilateral 2016   COLONOSCOPY  2007   echocardiogram (other)  01/16/2002   POLYPECTOMY     removed  tumors from foot nerves  04/1999   stress cardiolite  02/12/2006   TOENAIL EXCISION     TUBAL LIGATION     TYMPANOSTOMY TUBE PLACEMENT      Family History  Problem Relation Age of Onset   Heart disease Mother    Allergic rhinitis Mother    Heart disease Father    Stomach cancer Maternal Grandmother    Heart disease Maternal Grandfather    Rectal cancer Maternal Grandfather    Colon cancer Neg Hx    Breast cancer Neg Hx    Colon polyps Neg Hx    Esophageal cancer Neg Hx     Social History   Tobacco Use   Smoking status: Former    Packs/day: 0.25    Years: 5.00    Additional pack years: 0.00    Total pack years: 1.25    Types: Cigarettes    Quit date:  06/29/1968    Years since quitting: 54.4   Smokeless tobacco: Never  Substance Use Topics   Alcohol use: No    Alcohol/week: 0.0 standard drinks of alcohol    Subjective:   Concerned about discoloration on bilateral ears- denies any new soaps, foods, detergents or medications.   Opted against taking Fosamax- would be open to considering other options  Objective:  Vitals:   11/24/22 0945  BP: 114/62  Pulse: 85  SpO2: 99%  Weight: 154 lb 12.8 oz (70.2 kg)  Height: 5\' 4"  (1.626 m)    General: Well developed, well nourished, in no acute distress  Skin : Warm and dry.  Head: Normocephalic and atraumatic  Lungs: Respirations unlabored; clear to auscultation bilaterally without wheeze, rales, rhonchi  CVS exam: normal rate and regular rhythm.  Neurologic: Alert and oriented; speech intact; face symmetrical; moves all extremities well; CNII-XII intact without focal deficit   Assessment:  1. Skin change   2. Osteoporosis, unspecified osteoporosis type, unspecified pathological fracture presence     Plan:  Physical exam is reassuring but patient very concerned about changes noted on ears- ? Sun damage; trial of Hydrocortisone 2.5%- apply bid to affected area;  She defers taking Fosamax due to potential side effects;  information given regarding Prolia to consider- she will call back to let us know if she would like to try this medication.   Return in about 6 months (around 05/27/2023) for follow up.  No orders of the defined types were placed in this encounter.   Requested Prescriptions   Signed Prescriptions Disp Refills   hydrocortisone 2.5 % cream 30 g 0    Sig: Apply topically 2 (two) times daily.

## 2022-11-25 ENCOUNTER — Telehealth: Payer: Self-pay

## 2022-11-25 NOTE — Telephone Encounter (Addendum)
Patients states that she has knots on both sides of her stomach where she injects her Ozempic.  One from last week and one from this week.

## 2022-11-25 NOTE — Telephone Encounter (Signed)
Patient would like to know if it would be okay to start this supplement   Sweet Relief Glycogen Capsules,

## 2022-11-26 NOTE — Telephone Encounter (Signed)
Patient advised.

## 2022-11-30 ENCOUNTER — Other Ambulatory Visit: Payer: Self-pay | Admitting: Orthopaedic Surgery

## 2022-12-02 DIAGNOSIS — N130 Hydronephrosis with ureteropelvic junction obstruction: Secondary | ICD-10-CM | POA: Diagnosis not present

## 2022-12-06 NOTE — Congregational Nurse Program (Signed)
Member from [employer] seen today.  She reports some discoloration of outer ear.  Seeing dr for same. Possibly sun damage, given trial of hydrocortisone.   Juliann Pulse, RN  Congregational Nursing  [phone]

## 2022-12-08 DIAGNOSIS — L819 Disorder of pigmentation, unspecified: Secondary | ICD-10-CM | POA: Diagnosis not present

## 2022-12-08 DIAGNOSIS — L918 Other hypertrophic disorders of the skin: Secondary | ICD-10-CM | POA: Diagnosis not present

## 2022-12-09 DIAGNOSIS — M792 Neuralgia and neuritis, unspecified: Secondary | ICD-10-CM | POA: Diagnosis not present

## 2022-12-16 ENCOUNTER — Encounter: Payer: Self-pay | Admitting: Orthopaedic Surgery

## 2022-12-16 ENCOUNTER — Ambulatory Visit (INDEPENDENT_AMBULATORY_CARE_PROVIDER_SITE_OTHER): Payer: 59 | Admitting: Orthopaedic Surgery

## 2022-12-16 DIAGNOSIS — M1711 Unilateral primary osteoarthritis, right knee: Secondary | ICD-10-CM | POA: Diagnosis not present

## 2022-12-16 DIAGNOSIS — M1712 Unilateral primary osteoarthritis, left knee: Secondary | ICD-10-CM

## 2022-12-16 NOTE — Progress Notes (Signed)
The patient is a 77 year old female well-known to [place].  She comes in for follow-up after having therapy mainly on her upper extremity after mechanical fall but also in her legs.  She says she gets out of chairs better and overall she is doing well.  She says OT has been very important for her with her ADLs.  She has become more mobile and is not using a cane as much.  She also uses castor oil on her knees and other holistic remedies and those things are helping her quite a bit.  She has had the hyaluronic acid injections really did not help much.  Both knees seem to be moving well.  They are tender but really overall look good.  Her left upper extremity is also moving better and there is really no significant acute issues right now.  Follow-up can be as needed for now since she is mobilizing so well.  If this worsens for anyway she knows to let us know.

## 2022-12-21 ENCOUNTER — Telehealth: Payer: Self-pay | Admitting: Cardiology

## 2022-12-21 NOTE — Telephone Encounter (Signed)
Pt is calling to ask Dr. Shari Prows who she would recommend taking over her cardiac care, since she is leaving.   Pt states she prefers a female, but is ok if she has a female cardiologist to.  Informed the pt that Dr. Shari Prows is out of the office today but I will certainly route this message to her to further advise on.   She is aware that I will follow-up with her accordingly thereafter.  Pt verbalized understanding and agrees with this plan.

## 2022-12-21 NOTE — Telephone Encounter (Signed)
Left the pt a message to call the office back. 

## 2022-12-21 NOTE — Telephone Encounter (Signed)
Pt would like a call back from the nurse Lajoyce Corners) to discuss referral. Please advise.

## 2022-12-22 ENCOUNTER — Telehealth: Payer: Self-pay | Admitting: Family

## 2022-12-22 NOTE — Telephone Encounter (Signed)
Pt aware that Dr. Shari Prows recommends that she establish care with Dr. Duke Salvia at our [facility] location, if she is ok with that.   Pt states she is very familiar with our [facility] location.  Advised the pt to keep her appt with Robin Searing NP on 11/18, and I updated in the appt notes for Alden Server and team to have the next follow-up appt arranged with Dr. Duke Salvia to establish with.  She is aware that Alden Server will arrange the next appt with Dr. Duke Salvia at her 11/18 visit with him.  Pt verbalized understanding and agrees with this plan. Pt was more than gracious for all the assistance provided.

## 2022-12-22 NOTE — Telephone Encounter (Signed)
Pt is requesting tessalon pearls. Advised this is not on her medication list and she may need an appt. She stated she did not want to come in and wanted to check with pcp first.

## 2022-12-22 NOTE — Telephone Encounter (Signed)
Amy Escobar, Amy Escobar - 12/21/2022  8:58 AM Meriam Sprague, MD  Sent: Mon December 21, 2022  8:51 PM  To: Loa Socks, LPN         Message  How about Dr. Duke Salvia if she is able to travel to [place]? If this is too far, we can refer to Dr. Jacques Navy or Dr. Mayford Knife.

## 2022-12-23 DIAGNOSIS — E1142 Type 2 diabetes mellitus with diabetic polyneuropathy: Secondary | ICD-10-CM | POA: Diagnosis not present

## 2022-12-23 DIAGNOSIS — L603 Nail dystrophy: Secondary | ICD-10-CM | POA: Diagnosis not present

## 2022-12-23 DIAGNOSIS — E1151 Type 2 diabetes mellitus with diabetic peripheral angiopathy without gangrene: Secondary | ICD-10-CM | POA: Diagnosis not present

## 2022-12-23 DIAGNOSIS — I739 Peripheral vascular disease, unspecified: Secondary | ICD-10-CM | POA: Diagnosis not present

## 2022-12-23 NOTE — Telephone Encounter (Signed)
Spoke with pt to advise her it would be best to come in for an OV, pt stated she was "busy" at the moment and would call back at a later time to schedule appointment.

## 2022-12-24 NOTE — Congregational Nurse Program (Signed)
Member seen at [facility].  Walking without cane.  Reports is doing better with ambulation.  Offered comfort to me after prayer.  Juliann Pulse, RN  Congregational Nursing  [phone]

## 2022-12-25 ENCOUNTER — Ambulatory Visit
Admission: EM | Admit: 2022-12-25 | Discharge: 2022-12-25 | Disposition: A | Discharge status: Home / Self Care | Payer: 59 | Attending: Emergency Medicine | Admitting: Emergency Medicine

## 2022-12-25 DIAGNOSIS — R058 Other specified cough: Secondary | ICD-10-CM

## 2022-12-25 DIAGNOSIS — H66002 Acute suppurative otitis media without spontaneous rupture of ear drum, left ear: Secondary | ICD-10-CM | POA: Diagnosis not present

## 2022-12-25 DIAGNOSIS — J302 Other seasonal allergic rhinitis: Secondary | ICD-10-CM

## 2022-12-25 DIAGNOSIS — R0982 Postnasal drip: Secondary | ICD-10-CM | POA: Diagnosis not present

## 2022-12-25 MED ORDER — FLUTICASONE PROPIONATE 50 MCG/ACT NA SUSP: 1.0000 | Freq: Every day | NASAL | 2 refills | Status: DC

## 2022-12-25 MED ORDER — LEVOCETIRIZINE DIHYDROCHLORIDE 5 MG PO TABS: 5.0000 mg | ORAL_TABLET | Freq: Every evening | ORAL | 1 refills | Status: DC

## 2022-12-25 MED ORDER — PROMETHAZINE-DM 6.25-15 MG/5ML PO SYRP: 2.5000 mL | ORAL_SOLUTION | Freq: Every evening | ORAL | 0 refills | Status: DC | PRN

## 2022-12-25 MED ORDER — AZITHROMYCIN 250 MG PO TABS: ORAL_TABLET | ORAL | 0 refills | Status: AC

## 2022-12-25 NOTE — ED Triage Notes (Signed)
Patient presents to UC for cough, bilateral ear fullness x 1 week.  Treating with coricidin and delsym with no relief.   Denies fever.

## 2022-12-25 NOTE — Discharge Instructions (Signed)
Please read below to learn more about the medications, dosages and frequencies that I recommend to help alleviate your symptoms and to get you feeling better soon:   Z-Pak (azithromycin): To treat the infection in your left inner ear, please take two (2) tablets on day one and one tablet daily thereafter until the prescription is complete.This antibiotic can cause upset stomach, this will resolve once antibiotics are complete.  You are welcome to take a probiotic, eat yogurt, take Imodium while taking this medication.  Please avoid other systemic medications such as Maalox, Pepto-Bismol or milk of magnesia as they can interfere with the body's ability to absorb the antibiotics.       Xyzal (levocetirizine): This is an excellent second-generation antihistamine that helps to reduce respiratory inflammatory response to environmental allergens.  In some patients, this medication can cause daytime sleepiness so I recommend that you take 1 tablet daily at bedtime.     Flonase (fluticasone): This is a steroid nasal spray that used once daily, 1 spray in each nare.  This works best when used on a daily basis. This medication does not work well if it is only used when you think you need it.  After 3 to 5 days of use, you will notice significant reduction of the inflammation and mucus production that is currently being caused by exposure to allergens, whether seasonal or environmental.  The most common side effect of this medication is nosebleeds.  If you experience a nosebleed, please discontinue use for 1 week, then feel free to resume.  If you find that your insurance will not pay for this medication, please consider a different nasal steroids such as Nasonex (mometasone), or Nasacort (triamcinolone).   Promethazine DM: Promethazine is both a nasal decongestant that dries up mucous membranes and an antinausea medication.  Promethazine often makes most patients feel fairly sleepy.  "DM" is dextromethorphan, a single  symptom reliever which is a cough suppressant found in many over-the-counter cough medications and combination cold preparations.  Please take 2.5 mL before bedtime to minimize your cough which will help you sleep better.  I have sent a prescription for this medication to your pharmacy because it cannot be purchased over-the-counter.   If symptoms have not meaningfully improved in the next 3 to 5 days, please return for repeat evaluation or follow-up with your regular provider.  If symptoms have worsened in the next 3 to 5 days, please return for repeat evaluation or follow-up with your regular provider.    Thank you for visiting urgent care today.  We appreciate the opportunity to participate in your care.

## 2022-12-25 NOTE — ED Provider Notes (Signed)
[facility]    CSN: [id number] Arrival date & time: 12/25/22  1427    HISTORY   Chief Complaint  Patient presents with   Cough   Ear Fullness   HPI Amy Escobar is a pleasant, 77 y.o. female who presents to urgent care today. Patient c/o for non-productive cough, bilateral ear fullness x 1 week, right ear pain greater than left.  Patient denies hearing loss, drainage from either ear.  States she has been taking coricidin and delsym with no relief. Denies fever, body aches, chills, known sick contacts.  Patient reports a history of seasonal allergies, not currently taking any allergy medication, states she has not Flonase at home but has not been using it.   The history is provided by the patient.   Past Medical History:  Diagnosis Date   Alkaline phosphatase deficiency    w/u Ne   Allergic rhinitis    Allergy    Anxiety    Arthritis    Cataract    BILATERAL-REMOVED   DM2 (diabetes mellitus, type 2) (HCC)    GERD (gastroesophageal reflux disease)    Gout    Hemorrhoids    Hyperlipidemia    Hypertension    Mild pulmonary hypertension (HCC)    NSVT (nonsustained ventricular tachycardia) (HCC)    Osteoporosis    PVC's (premature ventricular contractions)    Thyroid nodule    small   Tubular adenoma of colon 2017   Urticaria    Uterine fibroid    Patient Active Problem List   Diagnosis Date Noted   Polyneuropathy associated with underlying disease (HCC) 08/31/2022   Type 2 diabetes mellitus with diabetic polyneuropathy, without long-term current use of insulin (HCC) 06/18/2021   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 without macular edema, without long-term current use of insulin (HCC) 06/18/2021   Diabetes mellitus (HCC) 06/18/2021   Osteoarthritis of right AC (acromioclavicular) joint 03/06/2021   Osteoarthritis of right glenohumeral joint 03/06/2021   Foot pain, bilateral 12/26/2020   Diabetic  neuropathy (HCC) 01/10/2020   Hav (hallux abducto valgus), unspecified laterality 01/10/2020   Chronic arthropathy 01/10/2020   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 edema associated with type 2 diabetes mellitus (HCC) 12/26/2019   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   AC (acromioclavicular) arthritis 08/02/2019   Unspecified inflammatory spondylopathy, 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   Claudication (HCC) 04/19/2019   Morbid obesity (HCC) 04/19/2019   Suspected COVID-19 virus infection 04/13/2019   Upper airway cough syndrome 08/22/2018   Greater trochanteric bursitis of both hips 06/15/2018   Trigger point of shoulder region, left 02/07/2018   Hypertension    Mild pulmonary hypertension (HCC)    NSVT (nonsustained ventricular tachycardia) (HCC)    PVC's (premature ventricular contractions)    URI (upper respiratory infection) 08/09/2016   Neck pain 02/10/2016   Urinary incontinence 02/10/2016   Degenerative arthritis of knee, bilateral 07/17/2015   Knee MCL sprain 06/07/2015   Discomfort in chest 02/15/2015   Chest pain 02/15/2015   DM type 2, controlled, with complication (HCC)    Wellness examination 08/06/2014   Acute meniscal tear of knee 02/13/2014   Primary localized osteoarthrosis, lower leg 02/13/2014   Gastrocnemius tear 12/25/2013   UTI (urinary tract infection) 12/20/2013   Right knee pain 12/20/2013   Pulmonary hypertension (HCC) 10/06/2013   DOE (dyspnea on exertion) 08/04/2013   Dysphagia, unspecified(787.20) 08/10/2012   Hip pain, left 02/02/2012   Painful  respiration 05/25/2011   Encounter for long-term (current) use of other medications 01/30/2011   PELVIC PAIN, LEFT 07/30/2010   TOBACCO USE, QUIT 07/07/2010   FATIGUE 12/20/2009   Headache 12/20/2009   Low back pain 12/26/2008   Disorder of liver 04/13/2008   FOOT PAIN, BILATERAL 04/13/2008   FIBROIDS, UTERUS 11/24/2007   THYROID NODULE, LEFT 11/24/2007    HYPERCHOLESTEROLEMIA 11/24/2007   CARPAL TUNNEL SYNDROME, BILATERAL 11/24/2007   ALKALINE PHOSPHATASE, ELEVATED 11/24/2007   Gout 08/10/2007   ANXIETY 08/10/2007   Essential hypertension 08/10/2007   Cough 08/01/2007   Perennial and seasonal allergic rhinitis 01/14/2007   GERD 01/14/2007   Osteoporosis 01/14/2007   Past Surgical History:  Procedure Laterality Date   CATARACT EXTRACTION Bilateral 2016   COLONOSCOPY  2007   echocardiogram (other)  01/16/2002   POLYPECTOMY     removed tumors from foot nerves  04/1999   stress cardiolite  02/12/2006   TOENAIL EXCISION     TUBAL LIGATION     TYMPANOSTOMY TUBE PLACEMENT     OB History   No obstetric history on file.    Home Medications    Prior to Admission medications   Medication Sig Start Date End Date Taking? Authorizing Provider  AMBULATORY NON FORMULARY MEDICATION Left wrist brace 11/06/21   Richardean Sale, DO  aspirin EC 81 MG tablet Take 81 mg by mouth daily. Swallow whole.    [provider]  Blood Glucose Monitoring Suppl (ONETOUCH VERIO FLEX SYSTEM) w/Device KIT USE TO TEST BLOOD SUGAR DAILY AS DIRECTED 06/19/22   Shamleffer, Konrad Dolores, MD  calcium elemental as carbonate (ANTACID MAXIMUM) 400 MG chewable tablet Chew 1,000 mg by mouth as needed for heartburn.    [provider]  carvedilol (COREG) 12.5 MG tablet TAKE 1 TABLET BY MOUTH TWO TIMES A DAY WITH A MEAL 12/25/21   Bhagat, [place], PA  celecoxib (CELEBREX) 200 MG capsule TAKE 1 CAPSULE BY MOUTH 2 TIMES A DAY BETWEEN MEALS AS NEEDED 11/30/22   Kirtland Bouchard, PA-C  cholecalciferol (VITAMIN D3) 25 MCG (1000 UNIT) tablet Take 1,000 Units by mouth daily.    [provider]  esomeprazole (NEXIUM) 40 MG capsule TAKE 1 CAPSULE BY MOUTH TWICE A DAY 30 TO 60 MINUTES BEFORE YOUR FIRST AND LAST MEALS OF THE DAY 11/02/22   Olive Bass, FNP  famotidine (PEPCID) 20 MG tablet Take 1 tablet (20 mg total) by mouth at bedtime.  04/21/22   Meriam Sprague, MD  fesoterodine (TOVIAZ) 4 MG TB24 tablet Take 4 mg daily by mouth.    [provider]  gabapentin (NEURONTIN) 100 MG capsule Take 1 capsule (100 mg total) by mouth at bedtime. 08/31/22   Shamleffer, Konrad Dolores, MD  glucose blood (ONETOUCH VERIO) test strip USE TO CHECK BLOOD SUGAE TWO TIMES A DAY 02/04/22   Shamleffer, Konrad Dolores, MD  guaiFENesin (MUCINEX) 600 MG 12 hr tablet Take 600 mg by mouth 2 (two) times daily as needed.    [provider]  hydrocortisone 2.5 % cream Apply topically 2 (two) times daily. 11/24/22   Olive Bass, FNP  montelukast (SINGULAIR) 10 MG tablet Take 1 tablet (10 mg total) by mouth daily. 04/21/22   Meriam Sprague, MD  Multiple Vitamins-Minerals (HAIR SKIN AND NAILS FORMULA PO) Take by mouth.    [provider]  rosuvastatin (CRESTOR) 20 MG tablet TAKE ONE TABLET BY MOUTH DAILY 05/08/22   Alver Sorrow, NP  Semaglutide,0.25 or 0.5MG /DOS, 2 MG/3ML  SOPN Inject 0.5 mg into the skin once a week. 08/31/22   Shamleffer, Konrad Dolores, MD  Turmeric (QC TUMERIC COMPLEX) 500 MG CAPS Take by mouth.    [provider]  valsartan (DIOVAN) 160 MG tablet Take 1 tablet (160 mg total) by mouth daily. 09/17/22   Meriam Sprague, MD    Family History Family History  Problem Relation Age of Onset   Heart disease Mother    Allergic rhinitis Mother    Heart disease Father    Stomach cancer Maternal Grandmother    Heart disease Maternal Grandfather    Rectal cancer Maternal Grandfather    Colon cancer Neg Hx    Breast cancer Neg Hx    Colon polyps Neg Hx    Esophageal cancer Neg Hx    Social History Social History   Tobacco Use   Smoking status: Former    Packs/day: 0.25    Years: 5.00    Additional pack years: 0.00    Total pack years: 1.25    Types: Cigarettes    Quit date: 06/29/1968    Years since quitting: 54.5   Smokeless tobacco: Never  Vaping Use   Vaping Use:  Never used  Substance Use Topics   Alcohol use: No    Alcohol/week: 0.0 standard drinks of alcohol   Drug use: No   Allergies   Olmesartan medoxomil  Review of Systems Review of Systems Pertinent findings revealed after performing a 14 point review of systems has been noted in the history of present illness.  Physical Exam Vital Signs BP 109/68 (BP Location: Left Arm)   Pulse (!) 103   Temp 98.6 F (37 C) (Oral)   Resp 16   SpO2 95%   No data found.  Physical Exam Vitals and nursing note reviewed.  Constitutional:      General: She is not in acute distress.    Appearance: Normal appearance. She is not ill-appearing.  HENT:     Head: Normocephalic and atraumatic.     Salivary Glands: Right salivary gland is not diffusely enlarged or tender. Left salivary gland is not diffusely enlarged or tender.     Right Ear: Ear canal and external ear normal. No drainage. A middle ear effusion is present. There is no impacted cerumen. Tympanic membrane is bulging. Tympanic membrane is not injected or erythematous.     Left Ear: Ear canal and external ear normal. No drainage. A middle ear effusion is present. There is no impacted cerumen. Tympanic membrane is bulging. Tympanic membrane is not injected or erythematous.     Ears:     Comments: Bilateral EACs normal, both TMs bulging with clear fluid    Nose: Rhinorrhea present. No nasal deformity, septal deviation, signs of injury, nasal tenderness, mucosal edema or congestion. Rhinorrhea is clear.     Right Nostril: Occlusion present. No foreign body, epistaxis or septal hematoma.     Left Nostril: Occlusion present. No foreign body, epistaxis or septal hematoma.     Right Turbinates: Enlarged, swollen and pale.     Left Turbinates: Enlarged, swollen and pale.     Right Sinus: No maxillary sinus tenderness or frontal sinus tenderness.     Left Sinus: No maxillary sinus tenderness or frontal sinus tenderness.     Mouth/Throat:     Lips:  Pink. No lesions.     Mouth: Mucous membranes are moist. No oral lesions.     Pharynx: Oropharynx is clear. Uvula midline. No posterior oropharyngeal erythema  or uvula swelling.     Tonsils: No tonsillar exudate. 0 on the right. 0 on the left.     Comments: Postnasal drip Eyes:     General: Lids are normal.        Right eye: No discharge.        Left eye: No discharge.     Extraocular Movements: Extraocular movements intact.     Conjunctiva/sclera: Conjunctivae normal.     Right eye: Right conjunctiva is not injected.     Left eye: Left conjunctiva is not injected.  Neck:     Trachea: Trachea and phonation normal.  Cardiovascular:     Rate and Rhythm: Normal rate and regular rhythm.     Pulses: Normal pulses.     Heart sounds: Normal heart sounds. No murmur heard.    No friction rub. No gallop.  Pulmonary:     Effort: Pulmonary effort is normal. No accessory muscle usage, prolonged expiration or respiratory distress.     Breath sounds: Normal breath sounds. No stridor, decreased air movement or transmitted upper airway sounds. No decreased breath sounds, wheezing, rhonchi or rales.  Chest:     Chest wall: No tenderness.  Musculoskeletal:        General: Normal range of motion.     Cervical back: Normal range of motion and neck supple. Normal range of motion.  Lymphadenopathy:     Cervical: No cervical adenopathy.  Skin:    General: Skin is warm and dry.     Findings: No erythema or rash.  Neurological:     General: No focal deficit present.     Mental Status: She is alert and oriented to person, place, and time.  Psychiatric:        Mood and Affect: Mood normal.        Behavior: Behavior normal.     Visual Acuity Right Eye Distance:   Left Eye Distance:   Bilateral Distance:    Right Eye Near:   Left Eye Near:    Bilateral Near:     UC Couse / Diagnostics / Procedures:     Radiology No results found.  Procedures Procedures (including critical care  time) EKG  Pending results:  Labs Reviewed - No data to display  Medications Ordered in UC: Medications - No data to display  UC Diagnoses / Final Clinical Impressions(s)   I have reviewed the triage vital signs and the nursing notes.  Pertinent labs & imaging results that were available during my care of the patient were reviewed by me and considered in my medical decision making (see chart for details).    Final diagnoses:  Acute suppurative otitis media of left ear without spontaneous rupture of tympanic membrane, recurrence not specified  Seasonal allergic rhinitis, unspecified trigger  Post-nasal drip  Non-productive cough   Patient advised physical exam findings concerning for uncontrolled seasonal allergies.  Patient also advised of infection in left inner ear, despite right ear pain being greater than left.  Patient provided with prescription for azithromycin for resolution of presumed bacterial infection in left inner ear.  Patient provided with prescriptions for Xyzal and Flonase which I have advised her to use every day for the next 2 to 3 months.  Conservative care recommended.  Return precautions advised.  Please see discharge instructions below for details of plan of care as provided to patient. ED Prescriptions     Medication Sig Dispense Auth. Provider   levocetirizine (XYZAL) 5 MG tablet Take 1 tablet (  5 mg total) by mouth every evening. 90 tablet Theadora Rama Scales, PA-C   fluticasone (FLONASE) 50 MCG/ACT nasal spray Place 1 spray into both nostrils daily. Begin by using 2 sprays in each nare daily for 3 to 5 days, then decrease to 1 spray in each nare daily. 15.8 mL Theadora Rama Scales, PA-C   promethazine-dextromethorphan (PROMETHAZINE-DM) 6.25-15 MG/5ML syrup Take 2.5 mLs by mouth at bedtime as needed for cough. 30 mL Theadora Rama Scales, PA-C   azithromycin (ZITHROMAX) 250 MG tablet Take 2 tablets (500 mg total) by mouth daily for 1 day, THEN 1 tablet  (250 mg total) daily for 4 days. 6 tablet Theadora Rama Scales, PA-C      PDMP not reviewed this encounter.  Pending results:  Labs Reviewed - No data to display  Discharge Instructions:   Discharge Instructions      Please read below to learn more about the medications, dosages and frequencies that I recommend to help alleviate your symptoms and to get you feeling better soon:   Z-Pak (azithromycin): To treat the infection in your left inner ear, please take two (2) tablets on day one and one tablet daily thereafter until the prescription is complete.This antibiotic can cause upset stomach, this will resolve once antibiotics are complete.  You are welcome to take a probiotic, eat yogurt, take Imodium while taking this medication.  Please avoid other systemic medications such as Maalox, Pepto-Bismol or milk of magnesia as they can interfere with the body's ability to absorb the antibiotics.       Xyzal (levocetirizine): This is an excellent second-generation antihistamine that helps to reduce respiratory inflammatory response to environmental allergens.  In some patients, this medication can cause daytime sleepiness so I recommend that you take 1 tablet daily at bedtime.     Flonase (fluticasone): This is a steroid nasal spray that used once daily, 1 spray in each nare.  This works best when used on a daily basis. This medication does not work well if it is only used when you think you need it.  After 3 to 5 days of use, you will notice significant reduction of the inflammation and mucus production that is currently being caused by exposure to allergens, whether seasonal or environmental.  The most common side effect of this medication is nosebleeds.  If you experience a nosebleed, please discontinue use for 1 week, then feel free to resume.  If you find that your insurance will not pay for this medication, please consider a different nasal steroids such as Nasonex (mometasone), or Nasacort  (triamcinolone).   Promethazine DM: Promethazine is both a nasal decongestant that dries up mucous membranes and an antinausea medication.  Promethazine often makes most patients feel fairly sleepy.  "DM" is dextromethorphan, a single symptom reliever which is a cough suppressant found in many over-the-counter cough medications and combination cold preparations.  Please take 2.5 mL before bedtime to minimize your cough which will help you sleep better.  I have sent a prescription for this medication to your pharmacy because it cannot be purchased over-the-counter.   If symptoms have not meaningfully improved in the next 3 to 5 days, please return for repeat evaluation or follow-up with your regular provider.  If symptoms have worsened in the next 3 to 5 days, please return for repeat evaluation or follow-up with your regular provider.    Thank you for visiting urgent care today.  We appreciate the opportunity to participate in your care.  Disposition Upon Discharge:  Condition: stable for discharge home  Patient presented with an acute illness with associated systemic symptoms and significant discomfort requiring urgent management. In my opinion, this is a condition that a prudent lay person (someone who possesses an average knowledge of health and medicine) may potentially expect to result in complications if not addressed urgently such as respiratory distress, impairment of bodily function or dysfunction of bodily organs.   Routine symptom specific, illness specific and/or disease specific instructions were discussed with the patient and/or caregiver at length.   As such, the patient has been evaluated and assessed, work-up was performed and treatment was provided in alignment with urgent care protocols and evidence based medicine.  Patient/parent/caregiver has been advised that the patient may require follow up for further testing and treatment if the symptoms continue in spite of  treatment, as clinically indicated and appropriate.  Patient/parent/caregiver has been advised to return to the [facility] or PCP if no better; to PCP or the Emergency Department if new signs and symptoms develop, or if the current signs or symptoms continue to change or worsen for further workup, evaluation and treatment as clinically indicated and appropriate  The patient will follow up with their current PCP if and as advised. If the patient does not currently have a PCP we will assist them in obtaining one.   The patient may need specialty follow up if the symptoms continue, in spite of conservative treatment and management, for further workup, evaluation, consultation and treatment as clinically indicated and appropriate.  Patient/parent/caregiver verbalized understanding and agreement of plan as discussed.  All questions were addressed during visit.  Please see discharge instructions below for further details of plan.  This office note has been dictated using Teaching laboratory technician.  Unfortunately, this method of dictation can sometimes lead to typographical or grammatical errors.  I apologize for your inconvenience in advance if this occurs.  Please do not hesitate to reach out to me if clarification is needed.      Theadora Rama Scales, PA-C 12/25/22 1512

## 2022-12-28 ENCOUNTER — Ambulatory Visit (INDEPENDENT_AMBULATORY_CARE_PROVIDER_SITE_OTHER): Payer: 59 | Admitting: *Deleted

## 2022-12-28 DIAGNOSIS — Z0000 Encounter for general adult medical examination without abnormal findings: Secondary | ICD-10-CM | POA: Diagnosis not present

## 2022-12-28 NOTE — Progress Notes (Signed)
Subjective:   Amy Escobar is a 77 y.o. female who presents for Medicare Annual (Subsequent) preventive examination.  Visit Complete: Virtual  I connected with  Amy Escobar on 12/28/22 by a audio enabled telemedicine application and verified that I am speaking with the correct person using two identifiers.  Patient Location: Home  Provider Location: Office/Clinic  I discussed the limitations of evaluation and management by telemedicine. The patient expressed understanding and agreed to proceed.   Review of Systems     Cardiac Risk Factors include: advanced age (>66men, >12 women);diabetes mellitus;dyslipidemia;hypertension     Objective:    Today's Vitals   There is no height or weight on file to calculate BMI.     12/28/2022    9:00 AM 10/26/2022   10:01 AM 05/18/2022    4:36 PM 10/07/2021    9:05 AM 07/17/2020   12:17 PM 07/12/2019    8:40 AM 04/01/2018    9:03 AM  Advanced Directives  Does Patient Have a Medical Advance Directive? Yes Yes No No Yes Yes Yes  Type of Estate agent of [place];Living will Healthcare Power of [place];Living will    Healthcare Power of [place];Living will Healthcare Power of [place];Living will  Does patient want to make changes to medical advance directive?  No - Patient declined   No - Patient declined No - Patient declined   Copy of Healthcare Power of Attorney in Chart? No - copy requested      Yes  Would patient like information on creating a medical advance directive?   No - Patient declined        Current Medications (verified) Outpatient Encounter Medications as of 12/28/2022  Medication Sig   AMBULATORY NON FORMULARY MEDICATION Left wrist brace   aspirin EC 81 MG tablet Take 81 mg by mouth daily. Swallow whole.   azithromycin (ZITHROMAX) 250 MG tablet Take 2 tablets (500 mg total) by mouth daily for 1 day, THEN 1 tablet (250 mg total) daily for 4 days.   Blood Glucose Monitoring Suppl (ONETOUCH VERIO  FLEX SYSTEM) w/Device KIT USE TO TEST BLOOD SUGAR DAILY AS DIRECTED   calcium elemental as carbonate (ANTACID MAXIMUM) 400 MG chewable tablet Chew 1,000 mg by mouth as needed for heartburn.   carvedilol (COREG) 12.5 MG tablet TAKE 1 TABLET BY MOUTH TWO TIMES A DAY WITH A MEAL   celecoxib (CELEBREX) 200 MG capsule TAKE 1 CAPSULE BY MOUTH 2 TIMES A DAY BETWEEN MEALS AS NEEDED   cholecalciferol (VITAMIN D3) 25 MCG (1000 UNIT) tablet Take 1,000 Units by mouth daily.   esomeprazole (NEXIUM) 40 MG capsule TAKE 1 CAPSULE BY MOUTH TWICE A DAY 30 TO 60 MINUTES BEFORE YOUR FIRST AND LAST MEALS OF THE DAY   famotidine (PEPCID) 20 MG tablet Take 1 tablet (20 mg total) by mouth at bedtime.   fesoterodine (TOVIAZ) 4 MG TB24 tablet Take 4 mg daily by mouth.   fluticasone (FLONASE) 50 MCG/ACT nasal spray Place 1 spray into both nostrils daily. Begin by using 2 sprays in each nare daily for 3 to 5 days, then decrease to 1 spray in each nare daily.   gabapentin (NEURONTIN) 100 MG capsule Take 1 capsule (100 mg total) by mouth at bedtime.   glucose blood (ONETOUCH VERIO) test strip USE TO CHECK BLOOD SUGAE TWO TIMES A DAY   guaiFENesin (MUCINEX) 600 MG 12 hr tablet Take 600 mg by mouth 2 (two) times daily as needed.   hydrocortisone 2.5 %  cream Apply topically 2 (two) times daily.   levocetirizine (XYZAL) 5 MG tablet Take 1 tablet (5 mg total) by mouth every evening.   montelukast (SINGULAIR) 10 MG tablet Take 1 tablet (10 mg total) by mouth daily.   Multiple Vitamins-Minerals (HAIR SKIN AND NAILS FORMULA PO) Take by mouth.   promethazine-dextromethorphan (PROMETHAZINE-DM) 6.25-15 MG/5ML syrup Take 2.5 mLs by mouth at bedtime as needed for cough.   rosuvastatin (CRESTOR) 20 MG tablet TAKE ONE TABLET BY MOUTH DAILY   Semaglutide,0.25 or 0.5MG /DOS, 2 MG/3ML SOPN Inject 0.5 mg into the skin once a week.   Turmeric (QC TUMERIC COMPLEX) 500 MG CAPS Take by mouth.   valsartan (DIOVAN) 160 MG tablet Take 1 tablet (160 mg  total) by mouth daily.   No facility-administered encounter medications on file as of 12/28/2022.    Allergies (verified) Olmesartan medoxomil   History: Past Medical History:  Diagnosis Date   Alkaline phosphatase deficiency    w/u Ne   Allergic rhinitis    Allergy    Anxiety    Arthritis    Cataract    BILATERAL-REMOVED   DM2 (diabetes mellitus, type 2) (HCC)    GERD (gastroesophageal reflux disease)    Gout    Hemorrhoids    Hyperlipidemia    Hypertension    Mild pulmonary hypertension (HCC)    NSVT (nonsustained ventricular tachycardia) (HCC)    Osteoporosis    PVC's (premature ventricular contractions)    Thyroid nodule    small   Tubular adenoma of colon 2017   Urticaria    Uterine fibroid    Past Surgical History:  Procedure Laterality Date   CATARACT EXTRACTION Bilateral 2016   COLONOSCOPY  2007   echocardiogram (other)  01/16/2002   POLYPECTOMY     removed tumors from foot nerves  04/1999   stress cardiolite  02/12/2006   TOENAIL EXCISION     TUBAL LIGATION     TYMPANOSTOMY TUBE PLACEMENT     Family History  Problem Relation Age of Onset   Heart disease Mother    Allergic rhinitis Mother    Heart disease Father    Stomach cancer Maternal Grandmother    Heart disease Maternal Grandfather    Rectal cancer Maternal Grandfather    Colon cancer Neg Hx    Breast cancer Neg Hx    Colon polyps Neg Hx    Esophageal cancer Neg Hx    Social History   Socioeconomic History   Marital status: Widowed    Spouse name: Not on file   Number of children: 2   Years of education: Not on file   Highest education level: Not on file  Occupational History   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    Social Determinants of Health   Financial Resource Strain: Low Risk  (12/28/2022)  Overall Financial Resource Strain (CARDIA)    Difficulty of Paying Living Expenses: Not hard at all  Food Insecurity: No Food Insecurity (07/31/2022)   Hunger Vital Sign    Worried About Running Out of Food in the Last Year: Never true    Ran Out of Food in the Last Year: Never true  Transportation Needs: No Transportation Needs (07/31/2022)   PRAPARE - Administrator, Civil Service (Medical): No    Lack of Transportation (Non-Medical): No  Physical Activity: Inactive (12/28/2022)   Exercise Vital Sign    Days of Exercise per Week: 0 days    Minutes of Exercise per Session: 0 min  Stress: No Stress Concern Present (12/28/2022)   [employer] of Occupational Health - Occupational Stress Questionnaire    Feeling of Stress : Not at all  Social Connections: Moderately Integrated (12/28/2022)   Social Connection and Isolation Panel [NHANES]    Frequency of Communication with Friends and Family: More than three times a week    Frequency of Social Gatherings with Friends and Family: More than three times a week    Attends Religious Services: More than 4 times per year    Active Member of [employer] or Organizations: Yes    Attends Banker Meetings: More than 4 times per year    Marital Status: Widowed    Tobacco Counseling Counseling given: Not Answered   Clinical Intake:  Pre-visit preparation completed: Yes  Pain : No/denies pain  Nutritional Risks: None Diabetes: Yes CBG done?: No Did pt. bring in CBG monitor from home?: No  How often do you need to have someone help you when you read instructions, pamphlets, or other written materials from  your doctor or pharmacy?: 1 - Never  Interpreter Needed?: No  Information entered by :: Donne Anon, CMA   Activities of Daily Living    12/28/2022    9:01 AM  In your present state of health, do you have any difficulty performing the following activities:  Hearing? 0  Vision? 0  Difficulty concentrating or making decisions? 1  Comment remembering  Walking or climbing stairs? 0  Dressing or bathing? 0  Doing errands, shopping? 0  Preparing Food and eating ? N  Using the Toilet? N  In the past six months, have you accidently leaked urine? N  Do you have problems with loss of bowel control? N  Managing your Medications? N  Managing your Finances? N  Housekeeping or managing your Housekeeping? N    Patient Care Team: Olive Bass, FNP as PCP - General (Internal Medicine) Meriam Sprague, MD as PCP - Cardiology (Cardiology) Lurena Nida, MD (Internal Medicine) Henreitta Leber, PA-C as Physician Assistant (Obstetrics and Gynecology) Lyn Records, MD (Inactive) as Consulting Physician (Cardiology) Louis Meckel, MD (Inactive) as Consulting Physician (Gastroenterology) Luciana Axe Alford Highland, MD as Consulting Physician (Ophthalmology) Nyoka Cowden, MD as Consulting Physician (Pulmonary Disease) Judi Saa, DO as Attending Physician (Family Medicine) Helane Gunther, DPM as Consulting Physician (Podiatry)  Indicate any recent Medical Services you may have received from other than [facility] providers in the past year (date may be approximate).     Assessment:   This is a routine wellness examination for [facility].  Hearing/Vision screen No results found.  Dietary issues and exercise activities discussed:     Goals Addressed   None    Depression Screen    12/28/2022    9:08 AM 11/24/2022    9:50 AM 08/07/2022  1:07 PM 03/03/2022    8:45 AM 10/07/2021    9:10 AM 09/16/2021    1:02 PM 04/29/2021    8:30 AM  PHQ 2/9 Scores  PHQ - 2 Score 0 0 0 0 0 0 0     Fall Risk    12/28/2022    9:01 AM 11/24/2022    9:50 AM 08/07/2022    1:07 PM 03/03/2022    8:45 AM 10/07/2021    9:09 AM  Fall Risk   Falls in the past year? 0 0 0 1 0  Number falls in past yr: 0 0 0 0 0  Injury with Fall? 0 0 0 1 0  Risk for fall due to : No Fall Risks No Fall Risks No Fall Risks    Follow up Falls evaluation completed Falls evaluation completed Falls evaluation completed  Falls prevention discussed    MEDICARE RISK AT HOME:  Medicare Risk at Home - 12/28/22 0905     Any stairs in or around the home? Yes    If so, are there any without handrails? No    Home free of loose throw rugs in walkways, pet beds, electrical cords, etc? Yes    Adequate lighting in your home to reduce risk of falls? Yes    Life alert? No    Use of a cane, walker or w/c? No    Grab bars in the bathroom? Yes   in the shower   Shower chair or bench in shower? Yes    Elevated toilet seat or a handicapped toilet? Yes             TIMED UP AND GO:  Was the test performed?  No    Cognitive Function:    06/16/2016    9:31 AM  MMSE - Mini Mental State Exam  Orientation to time 5  Orientation to Place 5  Registration 3  Attention/ Calculation 5  Recall 2  Language- name 2 objects 2  Language- repeat 1  Language- follow 3 step command 3  Language- read & follow direction 1  Write a sentence 1  Copy design 1  Total score 29      03/17/2017    9:47 AM  Montreal Cognitive Assessment   Visuospatial/ Executive (0/5) 5  Naming (0/3) 2  Attention: Read list of digits (0/2) 2  Attention: Read list of letters (0/1) 1  Attention: Serial 7 subtraction starting at 100 (0/3) 3  Language: Repeat phrase (0/2) 2  Language : Fluency (0/1) 1  Abstraction (0/2) 2  Delayed Recall (0/5) 4  Orientation (0/6) 6  Total 28  Adjusted Score (based on education) 29      12/28/2022    9:08 AM  6CIT Screen  What Year? 0 points  What month? 0 points  What time? 0 points  Count back from 20 0  points  Months in reverse 0 points  Repeat phrase 2 points  Total Score 2 points    Immunizations Immunization History  Administered Date(s) Administered   Fluad Quad(high Dose 65+) 04/18/2019   Influenza Split 03/23/2011, 04/07/2013   Influenza Whole 04/13/2008, 04/29/2009, 03/29/2010, 03/29/2018   Influenza-Unspecified 04/08/2012, 03/28/2014, 03/14/2015, 03/13/2016, 04/15/2020   PFIZER(Purple Top)SARS-COV-2 Vaccination 08/13/2019, 09/05/2019, 04/15/2020   PPD Test 03/23/2011   Pfizer Covid-19 Vaccine Bivalent Booster 48yrs & up 04/29/2021   Pneumococcal Conjugate-13 01/10/2014   Pneumococcal Polysaccharide-23 07/07/2010, 03/31/2018   Td 12/27/2001   Tdap 08/06/2014   Zoster Recombinant(Shingrix) 03/31/2018  TDAP status: Up to date  Flu Vaccine status: Up to date  Pneumococcal vaccine status: Up to date  Covid-19 vaccine status: Information provided on how to obtain vaccines.   Qualifies for Shingles Vaccine? Yes   Zostavax completed No   Shingrix Completed?: No.    Education has been provided regarding the importance of this vaccine. Patient has been advised to call insurance company to determine out of pocket expense if they have not yet received this vaccine. Advised may also receive vaccine at local pharmacy or Health Dept. Verbalized acceptance and understanding.  Screening Tests Health Maintenance  Topic Date Due   Zoster Vaccines- Shingrix (2 of 2) 05/26/2018   OPHTHALMOLOGY EXAM  06/27/2020   COVID-19 Vaccine (5 - 2023-24 season) 02/27/2022   Diabetic kidney evaluation - Urine ACR  06/18/2022   Medicare Annual Wellness (AWV)  10/08/2022   INFLUENZA VACCINE  01/28/2023   HEMOGLOBIN A1C  05/22/2023   Diabetic kidney evaluation - eGFR measurement  08/08/2023   FOOT EXAM  08/31/2023   DTaP/Tdap/Td (3 - Td or Tdap) 08/06/2024   Pneumonia Vaccine 80+ Years old  Completed   DEXA SCAN  Completed   Hepatitis C Screening  Completed   HPV VACCINES  Aged Out    Colonoscopy  Discontinued    Health Maintenance  Health Maintenance Due  Topic Date Due   Zoster Vaccines- Shingrix (2 of 2) 05/26/2018   OPHTHALMOLOGY EXAM  06/27/2020   COVID-19 Vaccine (5 - 2023-24 season) 02/27/2022   Diabetic kidney evaluation - Urine ACR  06/18/2022   Medicare Annual Wellness (AWV)  10/08/2022    Colorectal cancer screening: Type of screening: Colonoscopy. Completed 07/15/21. Repeat every N/a years  Mammogram status: Completed 11/09/22. Repeat every year  Bone Density status: Completed 03/31/22. Results reflect: Bone density results: OSTEOPOROSIS. Repeat every 2 years.  Lung Cancer Screening: (Low Dose CT Chest recommended if Age 31-80 years, 20 pack-year currently smoking OR have quit w/in 15years.) does not qualify.    Additional Screening:  Hepatitis C Screening: does qualify; Completed 01/10/14  Vision Screening: Recommended annual ophthalmology exams for early detection of glaucoma and other disorders of the eye. Is the patient up to date with their annual eye exam?  Yes  Who is the provider or what is the name of the office in which the patient attends annual eye exams? Dr.Rankin-retina specialist, [facility] for regular eye exam If pt is not established with a provider, would they like to be referred to a provider to establish care? No .   Dental Screening: Recommended annual dental exams for proper oral hygiene  Diabetic Foot Exam: Diabetic Foot Exam: Completed 08/31/22  Community Resource Referral / Chronic Care Management: CRR required this visit?  No   CCM required this visit?  No     Plan:     I have personally reviewed and noted the following in the patient's chart:   Medical and social history Use of alcohol, tobacco or illicit drugs  Current medications and supplements including opioid prescriptions. Patient is not currently taking opioid prescriptions. Functional ability and status Nutritional status Physical activity Advanced  directives List of other physicians Hospitalizations, surgeries, and ER visits in previous 12 months Vitals Screenings to include cognitive, depression, and falls Referrals and appointments  In addition, I have reviewed and discussed with patient certain preventive protocols, quality metrics, and best practice recommendations. A written personalized care plan for preventive services as well as general preventive health recommendations were provided to patient.  Donne Anon, CMA   12/28/2022   After Visit Summary: (Declined) Due to this being a telephonic visit, with patients personalized plan was offered to patient but patient Declined AVS at this time   Nurse Notes: None

## 2022-12-28 NOTE — Patient Instructions (Signed)
[name] , Thank you for taking time to come for your Medicare Wellness Visit. I appreciate your ongoing commitment to your health goals. Please review the following plan we discussed and let me know if I can assist you in the future.     This is a list of the screening recommended for you and due dates:  Health Maintenance  Topic Date Due   Zoster (Shingles) Vaccine (2 of 2) 05/26/2018   Eye exam for diabetics  06/27/2020   COVID-19 Vaccine (5 - 2023-24 season) 02/27/2022   Yearly kidney health urinalysis for diabetes  06/18/2022   Flu Shot  01/28/2023   Hemoglobin A1C  05/22/2023   Yearly kidney function blood test for diabetes  08/08/2023   Complete foot exam   08/31/2023   Medicare Annual Wellness Visit  12/28/2023   DTaP/Tdap/Td vaccine (3 - Td or Tdap) 08/06/2024   Pneumonia Vaccine  Completed   DEXA scan (bone density measurement)  Completed   Hepatitis C Screening  Completed   HPV Vaccine  Aged Out   Colon Cancer Screening  Discontinued    Next appointment: Follow up in one year for your annual wellness visit.   Preventive Care 42 Years and Older, Female Preventive care refers to lifestyle choices and visits with your health care provider that can promote health and wellness. What does preventive care include? A yearly physical exam. This is also called an annual well check. Dental exams once or twice a year. Routine eye exams. Ask your health care provider how often you should have your eyes checked. Personal lifestyle choices, including: Daily care of your teeth and gums. Regular physical activity. Eating a healthy diet. Avoiding tobacco and drug use. Limiting alcohol use. Practicing safe sex. Taking low-dose aspirin every day. Taking vitamin and mineral supplements as recommended by your health care provider. What happens during an annual well check? The services and screenings done by your health care provider during your annual well check will depend on your  age, overall health, lifestyle risk factors, and family history of disease. Counseling  Your health care provider may ask you questions about your: Alcohol use. Tobacco use. Drug use. Emotional well-being. Home and relationship well-being. Sexual activity. Eating habits. History of falls. Memory and ability to understand (cognition). Work and work Astronomer. Reproductive health. Screening  You may have the following tests or measurements: Height, weight, and BMI. Blood pressure. Lipid and cholesterol levels. These may be checked every 5 years, or more frequently if you are over 23 years old. Skin check. Lung cancer screening. You may have this screening every year starting at age 69 if you have a 30-pack-year history of smoking and currently smoke or have quit within the past 15 years. Fecal occult blood test (FOBT) of the stool. You may have this test every year starting at age 49. Flexible sigmoidoscopy or colonoscopy. You may have a sigmoidoscopy every 5 years or a colonoscopy every 10 years starting at age 76. Hepatitis C blood test. Hepatitis B blood test. Sexually transmitted disease (STD) testing. Diabetes screening. This is done by checking your blood sugar (glucose) after you have not eaten for a while (fasting). You may have this done every 1-3 years. Bone density scan. This is done to screen for osteoporosis. You may have this done starting at age 61. Mammogram. This may be done every 1-2 years. Talk to your health care provider about how often you should have regular mammograms. Talk with your health care provider about  your test results, treatment options, and if necessary, the need for more tests. Vaccines  Your health care provider may recommend certain vaccines, such as: Influenza vaccine. This is recommended every year. Tetanus, diphtheria, and acellular pertussis (Tdap, Td) vaccine. You may need a Td booster every 10 years. Zoster vaccine. You may need this after  age 20. Pneumococcal 13-valent conjugate (PCV13) vaccine. One dose is recommended after age 61. Pneumococcal polysaccharide (PPSV23) vaccine. One dose is recommended after age 74. Talk to your health care provider about which screenings and vaccines you need and how often you need them. This information is not intended to replace advice given to you by your health care provider. Make sure you discuss any questions you have with your health care provider. Document Released: 07/12/2015 Document Revised: 03/04/2016 Document Reviewed: 04/16/2015 Elsevier Interactive Patient Education  2017 [place] Prevention in the Home Falls can cause injuries. They can happen to people of all ages. There are many things you can do to make your home safe and to help prevent falls. What can I do on the outside of my home? Regularly fix the edges of walkways and driveways and fix any cracks. Remove anything that might make you trip as you walk through a door, such as a raised step or threshold. Trim any bushes or trees on the path to your home. Use bright outdoor lighting. Clear any walking paths of anything that might make someone trip, such as rocks or tools. Regularly check to see if handrails are loose or broken. Make sure that both sides of any steps have handrails. Any raised decks and porches should have guardrails on the edges. Have any leaves, snow, or ice cleared regularly. Use sand or salt on walking paths during winter. Clean up any spills in your garage right away. This includes oil or grease spills. What can I do in the bathroom? Use night lights. Install grab bars by the toilet and in the tub and shower. Do not use towel bars as grab bars. Use non-skid mats or decals in the tub or shower. If you need to sit down in the shower, use a plastic, non-slip stool. Keep the floor dry. Clean up any water that spills on the floor as soon as it happens. Remove soap buildup in the tub or shower  regularly. Attach bath mats securely with double-sided non-slip rug tape. Do not have throw rugs and other things on the floor that can make you trip. What can I do in the bedroom? Use night lights. Make sure that you have a light by your bed that is easy to reach. Do not use any sheets or blankets that are too big for your bed. They should not hang down onto the floor. Have a firm chair that has side arms. You can use this for support while you get dressed. Do not have throw rugs and other things on the floor that can make you trip. What can I do in the kitchen? Clean up any spills right away. Avoid walking on wet floors. Keep items that you use a lot in easy-to-reach places. If you need to reach something above you, use a strong step stool that has a grab bar. Keep electrical cords out of the way. Do not use floor polish or wax that makes floors slippery. If you must use wax, use non-skid floor wax. Do not have throw rugs and other things on the floor that can make you trip. What can I do with  my stairs? Do not leave any items on the stairs. Make sure that there are handrails on both sides of the stairs and use them. Fix handrails that are broken or loose. Make sure that handrails are as long as the stairways. Check any carpeting to make sure that it is firmly attached to the stairs. Fix any carpet that is loose or worn. Avoid having throw rugs at the top or bottom of the stairs. If you do have throw rugs, attach them to the floor with carpet tape. Make sure that you have a light switch at the top of the stairs and the bottom of the stairs. If you do not have them, ask someone to add them for you. What else can I do to help prevent falls? Wear shoes that: Do not have high heels. Have rubber bottoms. Are comfortable and fit you well. Are closed at the toe. Do not wear sandals. If you use a stepladder: Make sure that it is fully opened. Do not climb a closed stepladder. Make sure that  both sides of the stepladder are locked into place. Ask someone to hold it for you, if possible. Clearly mark and make sure that you can see: Any grab bars or handrails. First and last steps. Where the edge of each step is. Use tools that help you move around (mobility aids) if they are needed. These include: Canes. Walkers. Scooters. Crutches. Turn on the lights when you go into a dark area. Replace any light bulbs as soon as they burn out. Set up your furniture so you have a clear path. Avoid moving your furniture around. If any of your floors are uneven, fix them. If there are any pets around you, be aware of where they are. Review your medicines with your doctor. Some medicines can make you feel dizzy. This can increase your chance of falling. Ask your doctor what other things that you can do to help prevent falls. This information is not intended to replace advice given to you by your health care provider. Make sure you discuss any questions you have with your health care provider. Document Released: 04/11/2009 Document Revised: 11/21/2015 Document Reviewed: 07/20/2014 Elsevier Interactive Patient Education  2017 [employer].

## 2022-12-29 ENCOUNTER — Ambulatory Visit: Payer: 59 | Admitting: Family

## 2023-01-03 NOTE — Congregational Nurse Program (Signed)
Member from [employer] seen today.  Walking without cane.     Juliann Pulse, RN

## 2023-01-14 ENCOUNTER — Ambulatory Visit (INDEPENDENT_AMBULATORY_CARE_PROVIDER_SITE_OTHER): Payer: 59

## 2023-01-14 ENCOUNTER — Ambulatory Visit
Admission: EM | Admit: 2023-01-14 | Discharge: 2023-01-14 | Disposition: A | Discharge status: Home / Self Care | Payer: 59

## 2023-01-14 ENCOUNTER — Encounter: Payer: Self-pay | Admitting: *Deleted

## 2023-01-14 DIAGNOSIS — R0981 Nasal congestion: Secondary | ICD-10-CM | POA: Diagnosis not present

## 2023-01-14 DIAGNOSIS — R053 Chronic cough: Secondary | ICD-10-CM

## 2023-01-14 DIAGNOSIS — R918 Other nonspecific abnormal finding of lung field: Secondary | ICD-10-CM | POA: Diagnosis not present

## 2023-01-14 MED ORDER — PREDNISONE 20 MG PO TABS: 40.0000 mg | ORAL_TABLET | Freq: Every day | ORAL | 0 refills | Status: AC

## 2023-01-14 MED ORDER — BENZONATATE 100 MG PO CAPS: 100.0000 mg | ORAL_CAPSULE | Freq: Three times a day (TID) | ORAL | 0 refills | Status: DC | PRN

## 2023-01-14 NOTE — Discharge Instructions (Signed)
I have prescribed you prednisone steroid to help alleviate inflammation and upper airway and for cough.  Cough medication has also been prescribed for you to take as needed.  Please follow-up with primary care doctor and pulmonologist as your x-ray is showing possible chronic lung disease.

## 2023-01-14 NOTE — ED Provider Notes (Signed)
[facility]    CSN: [id number] Arrival date & time: 01/14/23  1016      History   Chief Complaint Chief Complaint  Patient presents with   Cough    HPI Amy Escobar is a 77 y.o. female.   Patient presents with persistent nasal congestion and cough that has been present for a few weeks.  Patient was seen on 12/25/2022 and prescribed azithromycin for ear infection, Flonase, levocetirizine, Promethazine DM.  Reports minimal improvement with medications.  Cough is dry at times and productive with white sputum at times per patient report.  She denies any fever, body aches, chills.  Denies chest pain or shortness of breath.  Denies history of asthma or COPD and patient does not smoke cigarettes.   Cough   Past Medical History:  Diagnosis Date   Alkaline phosphatase deficiency    w/u Ne   Allergic rhinitis    Allergy    Anxiety    Arthritis    Cataract    BILATERAL-REMOVED   DM2 (diabetes mellitus, type 2) (HCC)    GERD (gastroesophageal reflux disease)    Gout    Hemorrhoids    Hyperlipidemia    Hypertension    Mild pulmonary hypertension (HCC)    NSVT (nonsustained ventricular tachycardia) (HCC)    Osteoporosis    PVC's (premature ventricular contractions)    Thyroid nodule    small   Tubular adenoma of colon 2017   Urticaria    Uterine fibroid     Patient Active Problem List   Diagnosis Date Noted   Polyneuropathy associated with underlying disease (HCC) 08/31/2022   Type 2 diabetes mellitus with diabetic polyneuropathy, without long-term current use of insulin (HCC) 06/18/2021   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 without macular edema, without long-term current use of insulin (HCC) 06/18/2021   Diabetes mellitus (HCC) 06/18/2021   Osteoarthritis of right AC (acromioclavicular) joint 03/06/2021   Osteoarthritis of right glenohumeral joint 03/06/2021   Foot pain, bilateral 12/26/2020    Diabetic neuropathy (HCC) 01/10/2020   Hav (hallux abducto valgus), unspecified laterality 01/10/2020   Chronic arthropathy 01/10/2020   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 edema associated with type 2 diabetes mellitus (HCC) 12/26/2019   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   AC (acromioclavicular) arthritis 08/02/2019   Unspecified inflammatory spondylopathy, 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   Claudication (HCC) 04/19/2019   Morbid obesity (HCC) 04/19/2019   Suspected COVID-19 virus infection 04/13/2019   Upper airway cough syndrome 08/22/2018   Greater trochanteric bursitis of both hips 06/15/2018   Trigger point of shoulder region, left 02/07/2018   Hypertension    Mild pulmonary hypertension (HCC)    NSVT (nonsustained ventricular tachycardia) (HCC)    PVC's (premature ventricular contractions)    URI (upper respiratory infection) 08/09/2016   Neck pain 02/10/2016   Urinary incontinence 02/10/2016   Degenerative arthritis of knee, bilateral 07/17/2015   Knee MCL sprain 06/07/2015   Discomfort in chest 02/15/2015   Chest pain 02/15/2015   DM type 2, controlled, with complication (HCC)    Wellness examination 08/06/2014   Acute meniscal tear of knee 02/13/2014   Primary localized osteoarthrosis, lower leg 02/13/2014   Gastrocnemius tear 12/25/2013   UTI (urinary tract infection) 12/20/2013   Right knee pain 12/20/2013   Pulmonary hypertension (HCC) 10/06/2013   DOE (dyspnea on exertion) 08/04/2013   Dysphagia, unspecified(787.20) 08/10/2012   Hip pain, left 02/02/2012  Painful respiration 05/25/2011   Encounter for long-term (current) use of other medications 01/30/2011   PELVIC PAIN, LEFT 07/30/2010   TOBACCO USE, QUIT 07/07/2010   FATIGUE 12/20/2009   Headache 12/20/2009   Low back pain 12/26/2008   Disorder of liver 04/13/2008   FOOT PAIN, BILATERAL 04/13/2008   FIBROIDS, UTERUS 11/24/2007   THYROID NODULE, LEFT  11/24/2007   HYPERCHOLESTEROLEMIA 11/24/2007   CARPAL TUNNEL SYNDROME, BILATERAL 11/24/2007   ALKALINE PHOSPHATASE, ELEVATED 11/24/2007   Gout 08/10/2007   ANXIETY 08/10/2007   Essential hypertension 08/10/2007   Cough 08/01/2007   Perennial and seasonal allergic rhinitis 01/14/2007   GERD 01/14/2007   Osteoporosis 01/14/2007    Past Surgical History:  Procedure Laterality Date   CATARACT EXTRACTION Bilateral 2016   COLONOSCOPY  2007   echocardiogram (other)  01/16/2002   POLYPECTOMY     removed tumors from foot nerves  04/1999   stress cardiolite  02/12/2006   TOENAIL EXCISION     TUBAL LIGATION     TYMPANOSTOMY TUBE PLACEMENT      OB History   No obstetric history on file.      Home Medications    Prior to Admission medications   Medication Sig Start Date End Date Taking? Authorizing Provider  aspirin EC 81 MG tablet Take 81 mg by mouth daily. Swallow whole.   Yes [provider]  benzonatate (TESSALON) 100 MG capsule Take 1 capsule (100 mg total) by mouth every 8 (eight) hours as needed for cough. 01/14/23  Yes Saad Buhl, Rolly Salter E, FNP  carvedilol (COREG) 12.5 MG tablet TAKE 1 TABLET BY MOUTH TWO TIMES A DAY WITH A MEAL 12/25/21  Yes Bhagat, Bhavinkumar, PA  Chlorpheniramine-Acetaminophen (CORICIDIN HBP COLD/FLU PO) Take by mouth 2 (two) times daily.   Yes [provider]  DULoxetine (CYMBALTA) 30 MG capsule Take 30 mg by mouth daily.   Yes [provider]  esomeprazole (NEXIUM) 40 MG capsule TAKE 1 CAPSULE BY MOUTH TWICE A DAY 30 TO 60 MINUTES BEFORE YOUR FIRST AND LAST MEALS OF THE DAY 11/02/22  Yes Olive Bass, FNP  fesoterodine (TOVIAZ) 4 MG TB24 tablet Take 4 mg daily by mouth.   Yes [provider]  fluticasone (FLONASE) 50 MCG/ACT nasal spray Place 1 spray into both nostrils daily. Begin by using 2 sprays in each nare daily for 3 to 5 days, then decrease to 1 spray in each nare daily. 12/25/22  Yes Theadora Rama Scales, PA-C   guaiFENesin (MUCINEX) 600 MG 12 hr tablet Take 600 mg by mouth 2 (two) times daily as needed.   Yes [provider]  metformin (FORTAMET) 500 MG (OSM) 24 hr tablet Take 1,000 mg by mouth 2 (two) times daily with a meal.   Yes [provider]  Olopatadine HCl 0.2 % SOLN Apply to eye.   Yes [provider]  predniSONE (DELTASONE) 20 MG tablet Take 2 tablets (40 mg total) by mouth daily for 5 days. 01/14/23 01/19/23 Yes Kaylon Laroche, Acie Fredrickson, FNP  rosuvastatin (CRESTOR) 20 MG tablet TAKE ONE TABLET BY MOUTH DAILY 05/08/22  Yes Alver Sorrow, NP  valsartan (DIOVAN) 160 MG tablet Take 1 tablet (160 mg total) by mouth daily. 09/17/22  Yes Meriam Sprague, MD  Vitamin D, Ergocalciferol, (DRISDOL) 1.25 MG (50000 UNIT) CAPS capsule Take 50,000 Units by mouth every 7 (seven) days.   Yes [provider]  AMBULATORY NON FORMULARY MEDICATION Left wrist brace 11/06/21   Richardean Sale, DO  Blood  Glucose Monitoring Suppl (ONETOUCH VERIO FLEX SYSTEM) w/Device KIT USE TO TEST BLOOD SUGAR DAILY AS DIRECTED 06/19/22   Shamleffer, Konrad Dolores, MD  calcium elemental as carbonate (ANTACID MAXIMUM) 400 MG chewable tablet Chew 1,000 mg by mouth as needed for heartburn.    [provider]  celecoxib (CELEBREX) 200 MG capsule TAKE 1 CAPSULE BY MOUTH 2 TIMES A DAY BETWEEN MEALS AS NEEDED 11/30/22   Kirtland Bouchard, PA-C  cholecalciferol (VITAMIN D3) 25 MCG (1000 UNIT) tablet Take 1,000 Units by mouth daily.    [provider]  famotidine (PEPCID) 20 MG tablet Take 1 tablet (20 mg total) by mouth at bedtime. 04/21/22   Meriam Sprague, MD  gabapentin (NEURONTIN) 100 MG capsule Take 1 capsule (100 mg total) by mouth at bedtime. 08/31/22   Shamleffer, Konrad Dolores, MD  glucose blood (ONETOUCH VERIO) test strip USE TO CHECK BLOOD SUGAE TWO TIMES A DAY 02/04/22   Shamleffer, Konrad Dolores, MD  hydrocortisone 2.5 % cream Apply topically 2 (two) times daily. 11/24/22    Olive Bass, FNP  levocetirizine (XYZAL) 5 MG tablet Take 1 tablet (5 mg total) by mouth every evening. 12/25/22 06/23/23  Theadora Rama Scales, PA-C  montelukast (SINGULAIR) 10 MG tablet Take 1 tablet (10 mg total) by mouth daily. 04/21/22   Meriam Sprague, MD  Multiple Vitamins-Minerals (HAIR SKIN AND NAILS FORMULA PO) Take by mouth.    [provider]  promethazine-dextromethorphan (PROMETHAZINE-DM) 6.25-15 MG/5ML syrup Take 2.5 mLs by mouth at bedtime as needed for cough. 12/25/22   Theadora Rama Scales, PA-C  Semaglutide,0.25 or 0.5MG /DOS, 2 MG/3ML SOPN Inject 0.5 mg into the skin once a week. 08/31/22   Shamleffer, Konrad Dolores, MD  Turmeric (QC TUMERIC COMPLEX) 500 MG CAPS Take by mouth.    [provider]    Family History Family History  Problem Relation Age of Onset   Heart disease Mother    Allergic rhinitis Mother    Heart disease Father    Stomach cancer Maternal Grandmother    Heart disease Maternal Grandfather    Rectal cancer Maternal Grandfather    Colon cancer Neg Hx    Breast cancer Neg Hx    Colon polyps Neg Hx    Esophageal cancer Neg Hx     Social History Social History   Tobacco Use   Smoking status: Former    Current packs/day: 0.00    Average packs/day: 0.3 packs/day for 5.0 years (1.3 ttl pk-yrs)    Types: Cigarettes    Start date: 06/30/1963    Quit date: 06/29/1968    Years since quitting: 54.5   Smokeless tobacco: Never  Vaping Use   Vaping status: Never Used  Substance Use Topics   Alcohol use: No    Alcohol/week: 0.0 standard drinks of alcohol   Drug use: No     Allergies   Olmesartan medoxomil   Review of Systems Review of Systems Per HPI  Physical Exam Triage Vital Signs ED Triage Vitals  Encounter Vitals Group     BP 01/14/23 1022 128/72     Systolic BP Percentile --      Diastolic BP Percentile --      Pulse Rate 01/14/23 1022 86     Resp 01/14/23 1022 18     Temp 01/14/23 1022 97.8 F  (36.6 C)     Temp Source 01/14/23 1022 Oral     SpO2 01/14/23 1022 95 %     Weight --  Height --      Head Circumference --      Peak Flow --      Pain Score 01/14/23 1025 0     Pain Loc --      Pain Education --      Exclude from Growth Chart --    No data found.  Updated Vital Signs BP 128/72   Pulse 86   Temp 97.8 F (36.6 C) (Oral)   Resp 18   SpO2 95%   Visual Acuity Right Eye Distance:   Left Eye Distance:   Bilateral Distance:    Right Eye Near:   Left Eye Near:    Bilateral Near:     Physical Exam Constitutional:      General: She is not in acute distress.    Appearance: Normal appearance. She is not toxic-appearing or diaphoretic.  HENT:     Head: Normocephalic and atraumatic.     Right Ear: Ear canal normal. A middle ear effusion is present. Tympanic membrane is not perforated, erythematous or bulging.     Left Ear: Ear canal normal. A middle ear effusion is present. Tympanic membrane is not perforated, erythematous or bulging.     Nose: Congestion present.     Mouth/Throat:     Mouth: Mucous membranes are moist.     Pharynx: No posterior oropharyngeal erythema.  Eyes:     Extraocular Movements: Extraocular movements intact.     Conjunctiva/sclera: Conjunctivae normal.     Pupils: Pupils are equal, round, and reactive to light.  Cardiovascular:     Rate and Rhythm: Normal rate and regular rhythm.     Pulses: Normal pulses.     Heart sounds: Normal heart sounds.  Pulmonary:     Effort: Pulmonary effort is normal. No respiratory distress.     Breath sounds: Normal breath sounds. No stridor. No wheezing, rhonchi or rales.  Abdominal:     General: Abdomen is flat. Bowel sounds are normal.     Palpations: Abdomen is soft.  Musculoskeletal:        General: Normal range of motion.     Cervical back: Normal range of motion.  Skin:    General: Skin is warm and dry.  Neurological:     General: No focal deficit present.     Mental Status: She is  alert and oriented to person, place, and time. Mental status is at baseline.  Psychiatric:        Mood and Affect: Mood normal.        Behavior: Behavior normal.      UC Treatments / Results  Labs (all labs ordered are listed, but only abnormal results are displayed) Labs Reviewed - No data to display  EKG   Radiology DG Chest 2 View  Result Date: 01/14/2023 CLINICAL DATA:  persistent cough EXAM: CHEST - 2 VIEW COMPARISON:  CXR 12/07/21 FINDINGS: No pleural effusion. No pneumothorax. Unchanged cardiac and mediastinal contours. No focal airspace opacity. There are prominent bilateral interstitial opacities that are nonspecific and could represent changes related to an underlying interstitial lung disease. No radiographically apparent displaced rib fractures. Visualized upper abdomen is unremarkable. Vertebral body heights are maintained. IMPRESSION: 1. No focal airspace opacity. 2. Prominent bilateral interstitial opacities are nonspecific and could represent changes related to an underlying interstitial lung disease. Electronically Signed   By: Lorenza Cambridge M.D.   On: 01/14/2023 10:52    Procedures Procedures (including critical care time)  Medications Ordered in UC Medications -  No data to display  Initial Impression / Assessment and Plan / UC Course  I have reviewed the triage vital signs and the nursing notes.  Pertinent labs & imaging results that were available during my care of the patient were reviewed by me and considered in my medical decision making (see chart for details).     Chest x-ray is showing possible chronic lung disease but no acute abnormality. Patient reports that she used to smoke in the past but was not aware of any chronic lung disease. There are no signs of secondary bacterial infection on exam so will defer antibiotics at this time especially given patient was already treated with azithromycin.  I do think patient would benefit from prednisone to decrease  inflammation in chest.  Although, EF is 44% but despite this, I do think that benefits outweigh risks.  Low-dose and short course of prednisone was prescribed for patient.  Encouraged patient to monitor blood glucose very closely at home and follow-up with PCP if it becomes elevated with prednisone steroid treatment.  Benzonatate prescribed to take as needed for cough.  Patient reports that she has a pulmonologist and has an appointment next month so encouraged her to follow-up with them about chest x-ray findings today.  Also encouraged PCP follow-up.  Patient verbalized understanding and was agreeable with plan. Final Clinical Impressions(s) / UC Diagnoses   Final diagnoses:  Persistent cough  Nasal congestion     Discharge Instructions      I have prescribed you prednisone steroid to help alleviate inflammation and upper airway and for cough.  Cough medication has also been prescribed for you to take as needed.  Please follow-up with primary care doctor and pulmonologist as your x-ray is showing possible chronic lung disease.     ED Prescriptions     Medication Sig Dispense Auth. Provider   predniSONE (DELTASONE) 20 MG tablet Take 2 tablets (40 mg total) by mouth daily for 5 days. 10 tablet [place]ata, Cheraw E, [place]   benzonatate (TESSALON) 100 MG capsule Take 1 capsule (100 mg total) by mouth every 8 (eight) hours as needed for cough. 21 capsule [place], Acie Fredrickson, [place]      PDMP not reviewed this encounter.   Gustavus Bryant, [place] 01/14/23 1123

## 2023-01-14 NOTE — ED Triage Notes (Signed)
States was seen 12/25/22 and was given abx and nasal spray - states still has the nasal spray and is using, but "I was wondering if she'd give me a refill for the abx bc I still have a cough" and she describes coughing up whitish sputum.

## 2023-01-21 ENCOUNTER — Ambulatory Visit (INDEPENDENT_AMBULATORY_CARE_PROVIDER_SITE_OTHER): Payer: 59 | Admitting: Pulmonary Disease

## 2023-01-21 ENCOUNTER — Encounter (HOSPITAL_BASED_OUTPATIENT_CLINIC_OR_DEPARTMENT_OTHER): Payer: Self-pay | Admitting: Pulmonary Disease

## 2023-01-21 VITALS — BP 142/80 | HR 82 | Resp 19 | Ht 64.0 in | Wt 154.4 lb

## 2023-01-21 DIAGNOSIS — J3089 Other allergic rhinitis: Secondary | ICD-10-CM | POA: Diagnosis not present

## 2023-01-21 DIAGNOSIS — R058 Other specified cough: Secondary | ICD-10-CM | POA: Diagnosis not present

## 2023-01-21 DIAGNOSIS — J849 Interstitial pulmonary disease, unspecified: Secondary | ICD-10-CM

## 2023-01-21 NOTE — Patient Instructions (Signed)
Lab tests today  Will schedule high resolution CT chest and pulmonary function test  Follow up in 3 weeks

## 2023-01-21 NOTE — Progress Notes (Signed)
[facility]  Chief Complaint  Patient presents with   New Patient (Initial Visit)    Coughing , no shortness of breath,  had x-ray  done told that she had spot on lung and possible COPD    Past Surgical History:  She  has a past surgical history that includes removed tumors from foot nerves (04/1999); stress cardiolite (02/12/2006); echocardiogram (other) (01/16/2002); Tubal ligation; Toenail excision; Cataract extraction (Bilateral, 2016); Colonoscopy (2007); Tympanostomy tube placement; and Polypectomy.  Past Medical History:  Allergies, Anxiety, Arthritis, Cataract, DM type 2, GERD, Gout, HLD, HTN, NSVT, Urticaria, Neuropathy  Constitutional:  BP (!) 142/80   Pulse 82   Resp 19   Ht 5\' 4"  (1.626 m)   Wt 154 lb 6.4 oz (70 kg)   SpO2 99%   BMI 26.50 kg/m   Brief Summary:  Amy Escobar is a 77 y.o. female with upper airway cough, laryngopharyngeal reflux, and allergic rhinitis.      Subjective:   Previously seen by Dr. Sherene Sires.  Developed more of a cough over past few weeks.  Not bringing up sputum or having wheeze.  No fever, chest pain, or hemoptysis.  Hasn't been taking reflux medications.  Chest xray from 01/14/23 showed prominent interstitial opacities.  She was advised to have further assessment with pulmonary.  Physical Exam:   Appearance - well kempt   ENMT - no sinus tenderness, no oral exudate, no LAN, Mallampati 3 airway, no stridor  Respiratory - equal breath sounds bilaterally, no wheezing or rales  CV - s1s2 regular rate and rhythm, no murmurs  Ext - no clubbing, no edema  Skin - no rashes  Psych - normal mood and affect   Pulmonary testing:  Allergy test 06/26/19 >> grass, ragweed, trees, mold, dog, cockroach IgE 04/05/20 >> 2307 PFT 07/02/20 >> FEV1 1.49 (86%), FEV1% 97, TLC 2.94 (58%), DLCO 111%  Chest Imaging:  CT chest 11/11/21 >> mild diffuse interstitial thickening, fatty liver, Rt  hydronephrosis  Sleep Tests:  PSG 12/25/18 >> AHI 2.3, SpO2 low 90%  Cardiac Tests:  Echo 03/10/19 >> EF 55 to 60%  Social History:  She  reports that she quit smoking about 54 years ago. Her smoking use included cigarettes. She started smoking about 59 years ago. She has a 1.3 pack-year smoking history. She has never used smokeless tobacco. She reports that she does not drink alcohol and does not use drugs.  Family History:  Her family history includes Allergic rhinitis in her mother; Heart disease in her father, maternal grandfather, and mother; Rectal cancer in her maternal grandfather; Stomach cancer in her maternal grandmother.     Assessment/Plan:   Abnormal chest xray with prominent interstitial opacities. - will arrange for high resolution CT chest and pulmonary function testing to assess for interstitial lung disease - will check serology also  Upper airway cough with allergic rhinitis and elevate IgE. - will arrange for CMET, CBC with diff, and IgE - continue singulair, flonase, xyzal  Laryngopharyngeal reflux. - could have silent aspiration causing lung scarring  Time Spent Involved in Patient Care on Day of Examination:  37 minutes  Follow up:   Patient Instructions  Lab tests today  Will schedule high resolution CT chest and pulmonary function test  Follow up in 3 weeks  Medication List:   Allergies as of 01/21/2023       Reactions   Olmesartan Medoxomil    REACTION: headache  Medication List        Accurate as of January 21, 2023  9:42 AM. If you have any questions, ask your nurse or doctor.          STOP taking these medications    celecoxib 200 MG capsule Commonly known as: CELEBREX Stopped by: Coralyn Helling   fesoterodine 4 MG Tb24 tablet Commonly known as: TOVIAZ Stopped by: Coralyn Helling   guaiFENesin 600 MG 12 hr tablet Commonly known as: MUCINEX Stopped by: Jinelle Butchko   HAIR SKIN AND NAILS FORMULA PO Stopped by: Coralyn Helling   metformin 500 MG (OSM) 24 hr tablet Commonly known as: FORTAMET Stopped by: Coralyn Helling   promethazine-dextromethorphan 6.25-15 MG/5ML syrup Commonly known as: PROMETHAZINE-DM Stopped by: Coralyn Helling   Vitamin D (Ergocalciferol) 1.25 MG (50000 UNIT) Caps capsule Commonly known as: DRISDOL Stopped by: Coralyn Helling       TAKE these medications    AMBULATORY NON FORMULARY MEDICATION Left wrist brace   Antacid Maximum 1000 MG chewable tablet Generic drug: calcium elemental as carbonate Chew 1,000 mg by mouth as needed for heartburn.   aspirin EC 81 MG tablet Take 81 mg by mouth daily. Swallow whole.   benzonatate 100 MG capsule Commonly known as: TESSALON Take 1 capsule (100 mg total) by mouth every 8 (eight) hours as needed for cough.   carvedilol 12.5 MG tablet Commonly known as: COREG TAKE 1 TABLET BY MOUTH TWO TIMES A DAY WITH A MEAL   cholecalciferol 25 MCG (1000 UNIT) tablet Commonly known as: VITAMIN D3 Take 1,000 Units by mouth daily.   CORICIDIN HBP COLD/FLU PO Take by mouth 2 (two) times daily.   DULoxetine 30 MG capsule Commonly known as: CYMBALTA Take 30 mg by mouth daily.   esomeprazole 40 MG capsule Commonly known as: NEXIUM TAKE 1 CAPSULE BY MOUTH TWICE A DAY 30 TO 60 MINUTES BEFORE YOUR FIRST AND LAST MEALS OF THE DAY   famotidine 20 MG tablet Commonly known as: PEPCID Take 1 tablet (20 mg total) by mouth at bedtime.   fluticasone 50 MCG/ACT nasal spray Commonly known as: FLONASE Place 1 spray into both nostrils daily. Begin by using 2 sprays in each nare daily for 3 to 5 days, then decrease to 1 spray in each nare daily.   gabapentin 100 MG capsule Commonly known as: NEURONTIN Take 1 capsule (100 mg total) by mouth at bedtime.   hydrocortisone 2.5 % cream Apply topically 2 (two) times daily.   levocetirizine 5 MG tablet Commonly known as: XYZAL Take 1 tablet (5 mg total) by mouth every evening.   montelukast 10 MG  tablet Commonly known as: SINGULAIR Take 1 tablet (10 mg total) by mouth daily.   Olopatadine HCl 0.2 % Soln Apply to eye.   OneTouch Verio Flex System w/Device Kit USE TO TEST BLOOD SUGAR DAILY AS DIRECTED   OneTouch Verio test strip Generic drug: glucose blood USE TO CHECK BLOOD SUGAE TWO TIMES A DAY   QC Tumeric Complex 500 MG Caps Generic drug: Turmeric Take by mouth.   rosuvastatin 20 MG tablet Commonly known as: CRESTOR TAKE ONE TABLET BY MOUTH DAILY   Semaglutide(0.25 or 0.5MG /DOS) 2 MG/3ML Sopn Inject 0.5 mg into the skin once a week.   valsartan 160 MG tablet Commonly known as: DIOVAN Take 1 tablet (160 mg total) by mouth daily.        Signature:  Coralyn Helling, MD [facility] Pulmonary/Critical Care Pager - [phone] 01/21/2023,  9:42 AM

## 2023-01-25 ENCOUNTER — Ambulatory Visit (INDEPENDENT_AMBULATORY_CARE_PROVIDER_SITE_OTHER): Payer: 59 | Admitting: Pulmonary Disease

## 2023-01-25 DIAGNOSIS — J849 Interstitial pulmonary disease, unspecified: Secondary | ICD-10-CM | POA: Diagnosis not present

## 2023-01-25 LAB — PULMONARY FUNCTION TEST
DL/VA % pred: 76 %
DL/VA: 3.13 ml/min/mmHg/L
DLCO cor % pred: 53 %
DLCO cor: 10.18 ml/min/mmHg
DLCO unc % pred: 53 %
DLCO unc: 10.18 ml/min/mmHg
FEF 25-75 Post: 2.66 L/sec
FEF 25-75 Pre: 1.63 L/sec
FEF2575-%Change-Post: 62 %
FEF2575-%Pred-Post: 170 %
FEF2575-%Pred-Pre: 104 %
FEV1-%Change-Post: 9 %
FEV1-%Pred-Post: 85 %
FEV1-%Pred-Pre: 77 %
FEV1-Post: 1.74 L
FEV1-Pre: 1.59 L
FEV1FVC-%Change-Post: 4 %
FEV1FVC-%Pred-Pre: 108 %
FEV6-%Change-Post: 6 %
FEV6-%Pred-Post: 78 %
FEV6-%Pred-Pre: 73 %
FEV6-Post: 2.03 L
FEV6-Pre: 1.91 L
FEV6FVC-%Change-Post: 0 %
FEV6FVC-%Pred-Post: 104 %
FEV6FVC-%Pred-Pre: 105 %
FVC-%Change-Post: 4 %
FVC-%Pred-Post: 74 %
FVC-%Pred-Pre: 71 %
FVC-Post: 2.05 L
FVC-Pre: 1.96 L
Post FEV1/FVC ratio: 85 %
Post FEV6/FVC ratio: 99 %
Pre FEV1/FVC ratio: 81 %
Pre FEV6/FVC Ratio: 100 %
RV % pred: 116 %
RV: 2.72 L
TLC % pred: 85 %
TLC: 4.32 L

## 2023-01-25 NOTE — Progress Notes (Signed)
Full PFT performed today. °

## 2023-01-25 NOTE — Patient Instructions (Signed)
Full PFT performed today. °

## 2023-01-28 ENCOUNTER — Other Ambulatory Visit: Payer: Self-pay | Admitting: Family

## 2023-01-28 DIAGNOSIS — R058 Other specified cough: Secondary | ICD-10-CM

## 2023-01-29 ENCOUNTER — Ambulatory Visit (HOSPITAL_BASED_OUTPATIENT_CLINIC_OR_DEPARTMENT_OTHER)
Admission: RE | Admit: 2023-01-29 | Discharge: 2023-01-29 | Disposition: A | Discharge status: Home / Self Care | Payer: 59 | Source: Ambulatory Visit | Attending: Pulmonary Disease | Admitting: Pulmonary Disease

## 2023-01-29 DIAGNOSIS — R590 Localized enlarged lymph nodes: Secondary | ICD-10-CM | POA: Diagnosis not present

## 2023-01-29 DIAGNOSIS — R918 Other nonspecific abnormal finding of lung field: Secondary | ICD-10-CM | POA: Diagnosis not present

## 2023-01-29 DIAGNOSIS — J849 Interstitial pulmonary disease, unspecified: Secondary | ICD-10-CM | POA: Insufficient documentation

## 2023-01-29 DIAGNOSIS — I700 Atherosclerosis of aorta: Secondary | ICD-10-CM | POA: Diagnosis not present

## 2023-02-01 ENCOUNTER — Telehealth: Payer: Self-pay | Admitting: Radiology

## 2023-02-01 ENCOUNTER — Ambulatory Visit
Admission: EM | Admit: 2023-02-01 | Discharge: 2023-02-01 | Disposition: A | Discharge status: Home / Self Care | Payer: 59 | Attending: Internal Medicine | Admitting: Internal Medicine

## 2023-02-01 ENCOUNTER — Encounter: Payer: Self-pay | Admitting: Emergency Medicine

## 2023-02-01 DIAGNOSIS — Z20822 Contact with and (suspected) exposure to covid-19: Secondary | ICD-10-CM | POA: Insufficient documentation

## 2023-02-01 DIAGNOSIS — J069 Acute upper respiratory infection, unspecified: Secondary | ICD-10-CM | POA: Diagnosis not present

## 2023-02-01 MED ORDER — GUAIFENESIN 200 MG PO TABS: 200.0000 mg | ORAL_TABLET | ORAL | 0 refills | Status: DC | PRN

## 2023-02-01 NOTE — ED Provider Notes (Signed)
[facility]    CSN: [id number] Arrival date & time: 02/01/23  1124      History   Chief Complaint Chief Complaint  Patient presents with   Cough    HPI Amy Escobar is a 77 y.o. female.   Patient presents with headache, cough, nasal congestion, nasal drainage that started about 4 to 5 days ago.  Denies any fever at home.  Reports that she was exposed to COVID-19.  Did a COVID test at home that was negative.  Denies chest pain or shortness of breath.  Patient's history reports that she has a history of interstitial lung disease but patient reports that she has never been formally diagnosed with this.  She is currently being followed by pulmonology and several tests are being run per her report.  She also has a CT scan completed as well.  She has been taking benzonatate at home with minimal improvement.  Patient was recently seen for a persistent cough but reports that these current symptoms are new.   Cough   Past Medical History:  Diagnosis Date   Alkaline phosphatase deficiency    w/u Ne   Allergic rhinitis    Allergy    Anxiety    Arthritis    Cataract    BILATERAL-REMOVED   DM2 (diabetes mellitus, type 2) (HCC)    GERD (gastroesophageal reflux disease)    Gout    Hemorrhoids    Hyperlipidemia    Hypertension    Mild pulmonary hypertension (HCC)    NSVT (nonsustained ventricular tachycardia) (HCC)    Osteoporosis    PVC's (premature ventricular contractions)    Thyroid nodule    small   Tubular adenoma of colon 2017   Urticaria    Uterine fibroid     Patient Active Problem List   Diagnosis Date Noted   Polyneuropathy associated with underlying disease (HCC) 08/31/2022   Type 2 diabetes mellitus with diabetic polyneuropathy, without long-term current use of insulin (HCC) 06/18/2021   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 without macular edema, without long-term current use of insulin (HCC)  06/18/2021   Diabetes mellitus (HCC) 06/18/2021   Osteoarthritis of right AC (acromioclavicular) joint 03/06/2021   Osteoarthritis of right glenohumeral joint 03/06/2021   Foot pain, bilateral 12/26/2020   Diabetic neuropathy (HCC) 01/10/2020   Hav (hallux abducto valgus), unspecified laterality 01/10/2020   Chronic arthropathy 01/10/2020   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 edema associated with type 2 diabetes mellitus (HCC) 12/26/2019   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   AC (acromioclavicular) arthritis 08/02/2019   Unspecified inflammatory spondylopathy, 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   Claudication (HCC) 04/19/2019   Morbid obesity (HCC) 04/19/2019   Suspected COVID-19 virus infection 04/13/2019   Upper airway cough syndrome 08/22/2018   Greater trochanteric bursitis of both hips 06/15/2018   Trigger point of shoulder region, left 02/07/2018   Hypertension    Mild pulmonary hypertension (HCC)    NSVT (nonsustained ventricular tachycardia) (HCC)    PVC's (premature ventricular contractions)    URI (upper respiratory infection) 08/09/2016   Neck pain 02/10/2016   Urinary incontinence 02/10/2016   Degenerative arthritis of knee, bilateral 07/17/2015   Knee MCL sprain 06/07/2015   Discomfort in chest 02/15/2015   Chest pain 02/15/2015   DM type 2, controlled, with complication (HCC)    Wellness examination 08/06/2014   Acute meniscal tear of knee 02/13/2014   Primary localized osteoarthrosis, lower  leg 02/13/2014   Gastrocnemius tear 12/25/2013   UTI (urinary tract infection) 12/20/2013   Right knee pain 12/20/2013   Pulmonary hypertension (HCC) 10/06/2013   DOE (dyspnea on exertion) 08/04/2013   Dysphagia, unspecified(787.20) 08/10/2012   Hip pain, left 02/02/2012   Painful respiration 05/25/2011   Encounter for long-term (current) use of other medications 01/30/2011   PELVIC PAIN, LEFT 07/30/2010   TOBACCO USE,  QUIT 07/07/2010   FATIGUE 12/20/2009   Headache 12/20/2009   Low back pain 12/26/2008   Disorder of liver 04/13/2008   FOOT PAIN, BILATERAL 04/13/2008   FIBROIDS, UTERUS 11/24/2007   THYROID NODULE, LEFT 11/24/2007   HYPERCHOLESTEROLEMIA 11/24/2007   CARPAL TUNNEL SYNDROME, BILATERAL 11/24/2007   ALKALINE PHOSPHATASE, ELEVATED 11/24/2007   Gout 08/10/2007   ANXIETY 08/10/2007   Essential hypertension 08/10/2007   Cough 08/01/2007   Perennial and seasonal allergic rhinitis 01/14/2007   GERD 01/14/2007   Osteoporosis 01/14/2007    Past Surgical History:  Procedure Laterality Date   CATARACT EXTRACTION Bilateral 2016   COLONOSCOPY  2007   echocardiogram (other)  01/16/2002   POLYPECTOMY     removed tumors from foot nerves  04/1999   stress cardiolite  02/12/2006   TOENAIL EXCISION     TUBAL LIGATION     TYMPANOSTOMY TUBE PLACEMENT      OB History   No obstetric history on file.      Home Medications    Prior to Admission medications   Medication Sig Start Date End Date Taking? Authorizing Provider  AMBULATORY NON FORMULARY MEDICATION Left wrist brace 11/06/21  Yes Richardean Sale, DO  aspirin EC 81 MG tablet Take 81 mg by mouth daily. Swallow whole.   Yes [provider]  benzonatate (TESSALON) 100 MG capsule Take 1 capsule (100 mg total) by mouth every 8 (eight) hours as needed for cough. 01/14/23  Yes Lizbeth Feijoo, Rolly Salter E, FNP  Blood Glucose Monitoring Suppl (ONETOUCH VERIO FLEX SYSTEM) w/Device KIT USE TO TEST BLOOD SUGAR DAILY AS DIRECTED 06/19/22  Yes Shamleffer, Konrad Dolores, MD  calcium elemental as carbonate (ANTACID MAXIMUM) 400 MG chewable tablet Chew 1,000 mg by mouth as needed for heartburn.   Yes [provider]  carvedilol (COREG) 12.5 MG tablet TAKE 1 TABLET BY MOUTH TWO TIMES A DAY WITH A MEAL 12/25/21  Yes Bhagat, Bhavinkumar, PA  Chlorpheniramine-Acetaminophen (CORICIDIN HBP COLD/FLU PO) Take by mouth 2 (two) times daily.   Yes [provider]  cholecalciferol (VITAMIN D3) 25 MCG (1000 UNIT) tablet Take 1,000 Units by mouth daily.   Yes [provider]  DULoxetine (CYMBALTA) 30 MG capsule Take 30 mg by mouth daily.   Yes [provider]  esomeprazole (NEXIUM) 40 MG capsule TAKE 1 CAPSULE BY MOUTH 2 TIMES A DAY 30 TO 60 MINUTES BEFORE YOUR FIRST AND LAST MEALS OF THE DAY 01/28/23  Yes Olive Bass, FNP  famotidine (PEPCID) 20 MG tablet Take 1 tablet (20 mg total) by mouth at bedtime. 04/21/22  Yes Meriam Sprague, MD  fluticasone (FLONASE) 50 MCG/ACT nasal spray Place 1 spray into both nostrils daily. Begin by using 2 sprays in each nare daily for 3 to 5 days, then decrease to 1 spray in each nare daily. 12/25/22  Yes Theadora Rama Scales, PA-C  gabapentin (NEURONTIN) 100 MG capsule Take 1 capsule (100 mg total) by mouth at bedtime. 08/31/22  Yes Shamleffer, Konrad Dolores, MD  glucose blood (ONETOUCH VERIO) test strip USE TO CHECK BLOOD SUGAE TWO TIMES  A DAY 02/04/22  Yes Shamleffer, Konrad Dolores, MD  guaiFENesin 200 MG tablet Take 1 tablet (200 mg total) by mouth every 4 (four) hours as needed for cough or to loosen phlegm. 02/01/23  Yes Altamese Deguire, Rolly Salter E, FNP  hydrocortisone 2.5 % cream Apply topically 2 (two) times daily. 11/24/22  Yes Olive Bass, FNP  levocetirizine (XYZAL) 5 MG tablet Take 1 tablet (5 mg total) by mouth every evening. 12/25/22 06/23/23 Yes Theadora Rama Scales, PA-C  montelukast (SINGULAIR) 10 MG tablet Take 1 tablet (10 mg total) by mouth daily. 04/21/22  Yes Meriam Sprague, MD  Olopatadine HCl 0.2 % SOLN Apply to eye.   Yes [provider]  rosuvastatin (CRESTOR) 20 MG tablet TAKE ONE TABLET BY MOUTH DAILY 05/08/22  Yes Alver Sorrow, NP  Semaglutide,0.25 or 0.5MG /DOS, 2 MG/3ML SOPN Inject 0.5 mg into the skin once a week. 08/31/22  Yes Shamleffer, Konrad Dolores, MD  Turmeric (QC TUMERIC COMPLEX) 500 MG CAPS Take by mouth.   Yes [provider]  valsartan (DIOVAN) 160 MG tablet Take 1 tablet (160 mg total) by mouth daily. 09/17/22  Yes Meriam Sprague, MD    Family History Family History  Problem Relation Age of Onset   Heart disease Mother    Allergic rhinitis Mother    Heart disease Father    Stomach cancer Maternal Grandmother    Heart disease Maternal Grandfather    Rectal cancer Maternal Grandfather    Colon cancer Neg Hx    Breast cancer Neg Hx    Colon polyps Neg Hx    Esophageal cancer Neg Hx     Social History Social History   Tobacco Use   Smoking status: Former    Current packs/day: 0.00    Average packs/day: 0.3 packs/day for 5.0 years (1.3 ttl pk-yrs)    Types: Cigarettes    Start date: 06/30/1963    Quit date: 06/29/1968    Years since quitting: 54.6   Smokeless tobacco: Never  Vaping Use   Vaping status: Never Used  Substance Use Topics   Alcohol use: No    Alcohol/week: 0.0 standard drinks of alcohol   Drug use: No     Allergies   Olmesartan medoxomil   Review of Systems Review of Systems Per HPI  Physical Exam Triage Vital Signs ED Triage Vitals  Encounter Vitals Group     BP 02/01/23 1314 (!) 159/93     Systolic BP Percentile --      Diastolic BP Percentile --      Pulse Rate 02/01/23 1314 (!) 107     Resp 02/01/23 1314 18     Temp 02/01/23 1314 99.9 F (37.7 C)     Temp Source 02/01/23 1314 Oral     SpO2 02/01/23 1314 91 %     Weight 02/01/23 1315 152 lb (68.9 kg)     Height 02/01/23 1315 5\' 8"  (1.727 m)     Head Circumference --      Peak Flow --      Pain Score 02/01/23 1315 9     Pain Loc --      Pain Education --      Exclude from Growth Chart --    No data found.  Updated Vital Signs BP (!) 159/93 (BP Location: Left Arm)   Pulse (!) 107   Temp 99.9 F (37.7 C) (Oral)   Resp 18   Ht 5\' 8"  (1.727 m)   Wt 152  lb (68.9 kg)   SpO2 98%   BMI 23.11 kg/m   Visual Acuity Right Eye Distance:   Left Eye Distance:   Bilateral Distance:     Right Eye Near:   Left Eye Near:    Bilateral Near:     Physical Exam Constitutional:      General: She is not in acute distress.    Appearance: Normal appearance. She is not toxic-appearing or diaphoretic.  HENT:     Head: Normocephalic and atraumatic.     Right Ear: Tympanic membrane and ear canal normal.     Left Ear: Tympanic membrane and ear canal normal.     Nose: Congestion present.     Mouth/Throat:     Mouth: Mucous membranes are moist.     Pharynx: No posterior oropharyngeal erythema.  Eyes:     Extraocular Movements: Extraocular movements intact.     Conjunctiva/sclera: Conjunctivae normal.     Pupils: Pupils are equal, round, and reactive to light.  Cardiovascular:     Rate and Rhythm: Regular rhythm. Tachycardia present.     Pulses: Normal pulses.     Heart sounds: Normal heart sounds.  Pulmonary:     Effort: Pulmonary effort is normal. No respiratory distress.     Breath sounds: Normal breath sounds. No stridor. No wheezing, rhonchi or rales.  Abdominal:     General: Abdomen is flat. Bowel sounds are normal.     Palpations: Abdomen is soft.  Musculoskeletal:        General: Normal range of motion.     Cervical back: Normal range of motion.  Skin:    General: Skin is warm and dry.  Neurological:     General: No focal deficit present.     Mental Status: She is alert and oriented to person, place, and time. Mental status is at baseline.  Psychiatric:        Mood and Affect: Mood normal.        Behavior: Behavior normal.      UC Treatments / Results  Labs (all labs ordered are listed, but only abnormal results are displayed) Labs Reviewed  SARS CORONAVIRUS 2 (TAT 6-24 HRS)    EKG   Radiology No results found.  Procedures Procedures (including critical care time)  Medications Ordered in UC Medications - No data to display  Initial Impression / Assessment and Plan / UC Course  I have reviewed the triage vital signs and the nursing  notes.  Pertinent labs & imaging results that were available during my care of the patient were reviewed by me and considered in my medical decision making (see chart for details).     Patient presents with symptoms likely from a viral upper respiratory infection.  Do not suspect underlying cardiopulmonary process. Symptoms seem unlikely related to ACS, CHF or COPD exacerbations, pneumonia, pneumothorax. Patient is nontoxic appearing and not in need of emergent medical intervention.  COVID PCR pending given close exposure.  Patient's oxygen during triage was 91%.  On physical exam by provider, oxygen was 98% and sustaining. She is also not tachypneic with no adventitious lung sounds so do not think that chest imaging or emergent evaluation is necessary at this time.  Patient is mildly tachycardic but suspect this is due to inflammation from acute viral illness and no further workup or emergent evaluation is needed for this.  Also has low-grade temp which is most likely contributing to this.  Recommended symptom control with medications and supportive care.  Patient requesting cough medication given benzonatate has not been helpful.  Will treat with guaifenesin given patient takes blood pressure medication as this is most likely safest option at this time.  Return if symptoms fail to improve.  Patient given strict ER precautions.  Patient states understanding and is agreeable.  Discharged with PCP followup.  Final Clinical Impressions(s) / UC Diagnoses   Final diagnoses:  Viral upper respiratory tract infection with cough  Close exposure to COVID-19 virus     Discharge Instructions      COVID test is pending.  We will call if it is positive.  I have sent you cough medication to see if this will be helpful.  Please follow-up if any symptoms persist or worsen.    ED Prescriptions     Medication Sig Dispense Auth. Provider   guaiFENesin 200 MG tablet Take 1 tablet (200 mg total) by mouth  every 4 (four) hours as needed for cough or to loosen phlegm. 30 tablet [place], Acie Fredrickson, [place]      PDMP not reviewed this encounter.   Gustavus Bryant, [place] 02/01/23 1356

## 2023-02-01 NOTE — ED Triage Notes (Signed)
Patient c/o headache, cough, congestion, nasal drainage since yesterday.  Patient was exposed to COVID.  Home COVID test was negative.  Patient has taken Benzentate.

## 2023-02-01 NOTE — Discharge Instructions (Addendum)
COVID test is pending.  We will call if it is positive.  I have sent you cough medication to see if this will be helpful.  Please follow-up if any symptoms persist or worsen.

## 2023-02-01 NOTE — Telephone Encounter (Signed)
Received fax request from [facility] for refill on Celecoxib 200mg  capsule.  Take one capsule by mouth 2 times a day between meals as needed #60 , last filled 01/04/2023.    Please advise. OK to refill?

## 2023-02-02 ENCOUNTER — Other Ambulatory Visit: Payer: Self-pay | Admitting: Family

## 2023-02-02 ENCOUNTER — Telehealth: Payer: Self-pay | Admitting: Family

## 2023-02-02 ENCOUNTER — Other Ambulatory Visit: Payer: Self-pay

## 2023-02-02 MED ORDER — NIRMATRELVIR/RITONAVIR (PAXLOVID)TABLET: 3.0000 | ORAL_TABLET | Freq: Two times a day (BID) | ORAL | 0 refills | Status: AC

## 2023-02-02 MED ORDER — CELECOXIB 200 MG PO CAPS: 200.0000 mg | ORAL_CAPSULE | Freq: Every day | ORAL | 1 refills | Status: DC

## 2023-02-02 NOTE — Telephone Encounter (Signed)
Spoke with pt, pt is aware and expressed understanding, pt was advised to stop Crestor until finished with Paxlovid.

## 2023-02-02 NOTE — Telephone Encounter (Signed)
Pt was seen by UC and received a positive result and would like Paxlovid to be sent in. Pt uses [facility]. . Please call patient to advise.

## 2023-02-05 ENCOUNTER — Ambulatory Visit: Payer: 59 | Admitting: Family

## 2023-02-05 ENCOUNTER — Other Ambulatory Visit: Payer: Self-pay | Admitting: Family

## 2023-02-05 DIAGNOSIS — E785 Hyperlipidemia, unspecified: Secondary | ICD-10-CM

## 2023-02-05 DIAGNOSIS — I25118 Atherosclerotic heart disease of native coronary artery with other forms of angina pectoris: Secondary | ICD-10-CM

## 2023-02-11 ENCOUNTER — Telehealth: Payer: Self-pay | Admitting: Family

## 2023-02-11 NOTE — Telephone Encounter (Signed)
Prescription Request  02/11/2023  Is this a "Controlled Substance" medicine? No  LOV: 11/24/2022  What is the name of the medication or equipment?   guaiFENesin 200 MG tablet [[id number]]  Have you contacted your pharmacy to request a refill? No   Which pharmacy would you like this sent to?  [id number] [place], [place] - [address] [place] [address] Phone: [phone] Fax: [phone]    Patient notified that their request is being sent to the clinical staff for review and that they should receive a response within 2 business days.   Please advise at Mobile There is no such number on file (mobile).

## 2023-02-12 ENCOUNTER — Other Ambulatory Visit: Payer: Self-pay | Admitting: Family

## 2023-02-12 MED ORDER — GUAIFENESIN 200 MG PO TABS: 200.0000 mg | ORAL_TABLET | ORAL | 0 refills | Status: DC | PRN

## 2023-02-15 ENCOUNTER — Encounter: Payer: Self-pay | Admitting: *Deleted

## 2023-02-28 ENCOUNTER — Other Ambulatory Visit: Payer: Self-pay | Admitting: Family

## 2023-03-01 NOTE — Congregational Nurse Program (Signed)
Member from [employer] seen today.  Walking without cane.    Juliann Pulse, RN  Congregational Nursing  [phone]

## 2023-03-03 ENCOUNTER — Ambulatory Visit (INDEPENDENT_AMBULATORY_CARE_PROVIDER_SITE_OTHER): Payer: 59 | Admitting: Pulmonary Disease

## 2023-03-03 ENCOUNTER — Encounter (HOSPITAL_BASED_OUTPATIENT_CLINIC_OR_DEPARTMENT_OTHER): Payer: Self-pay | Admitting: Pulmonary Disease

## 2023-03-03 VITALS — BP 108/70 | HR 100 | Resp 16 | Ht 68.0 in | Wt 163.2 lb

## 2023-03-03 DIAGNOSIS — J3089 Other allergic rhinitis: Secondary | ICD-10-CM

## 2023-03-03 DIAGNOSIS — J849 Interstitial pulmonary disease, unspecified: Secondary | ICD-10-CM | POA: Diagnosis not present

## 2023-03-03 MED ORDER — FLUTICASONE-SALMETEROL 115-21 MCG/ACT IN AERO: 2.0000 | INHALATION_SPRAY | Freq: Two times a day (BID) | RESPIRATORY_TRACT | 12 refills | Status: DC

## 2023-03-03 NOTE — Patient Instructions (Signed)
You have interstitial lung disease.  This is scarring in the lungs.  You will be scheduled for an appointment with Dr. Marchelle Gearing in 6 weeks, who is our interstitial lung disease expert.  Will have you try symbicort two puffs twice per day since you might also have asthma related to allergies.

## 2023-03-03 NOTE — Progress Notes (Signed)
[facility]  Chief Complaint  Patient presents with   Follow-up    Past Surgical History:  She  has a past surgical history that includes removed tumors from foot nerves (04/1999); stress cardiolite (02/12/2006); echocardiogram (other) (01/16/2002); Tubal ligation; Toenail excision; Cataract extraction (Bilateral, 2016); Colonoscopy (2007); Tympanostomy tube placement; and Polypectomy.  Past Medical History:  Allergies, Anxiety, Arthritis, Cataract, DM type 2, GERD, Gout, HLD, HTN, NSVT, Urticaria, Neuropathy  Constitutional:  BP 108/70   Pulse 100   Resp 16   Ht 5\' 8"  (1.727 m)   Wt 163 lb 3.2 oz (74 kg)   SpO2 97%   BMI 24.81 kg/m   Brief Summary:  Amy Escobar is a 77 y.o. female with upper airway cough, laryngopharyngeal reflux, and allergic rhinitis.      Subjective:   She had COVID in August 2024.  PFT showed moderate diffusion defect.  HRCT chest showed probable UIP changes.  Labs from 01/21/23 were unremarkable expect elevated IgE.  She has cough with chest congestion.  Has been using robitussin.  Physical Exam:   Appearance - well kempt   ENMT - no sinus tenderness, no oral exudate, no LAN, Mallampati 3 airway, no stridor  Respiratory - equal breath sounds bilaterally, no wheezing or rales  CV - s1s2 regular rate and rhythm, no murmurs  Ext - no clubbing, no edema  Skin - no rashes  Psych - normal mood and affect   Pulmonary testing:  Allergy test 06/26/19 >> grass, ragweed, trees, mold, dog, cockroach IgE 04/05/20 >> 2307 PFT 07/02/20 >> FEV1 1.49 (86%), FEV1% 97, TLC 2.94 (58%), DLCO 111% IgE 01/21/23 >> 1646 Serology 01/21/23 >> ANA, RF negative PFT 01/25/23 >> FEV1 1.74 (85%), FEV1% 85, TLC 3.98 (85%), DLCO 53%  Chest Imaging:  CT chest 11/11/21 >> mild diffuse interstitial thickening, fatty liver, Rt hydronephrosis HRCT chest 02/04/23 >> coronary atherosclerosis; patchy subpleural reticulation and  GGO with traction BTX (probable UIP)  Sleep Tests:  PSG 12/25/18 >> AHI 2.3, SpO2 low 90%  Cardiac Tests:  Echo 03/10/19 >> EF 55 to 60%  Social History:  She  reports that she quit smoking about 54 years ago. Her smoking use included cigarettes. She started smoking about 59 years ago. She has a 1.3 pack-year smoking history. She has never used smokeless tobacco. She reports that she does not drink alcohol and does not use drugs.  Family History:  Her family history includes Allergic rhinitis in her mother; Heart disease in her father, maternal grandfather, and mother; Rectal cancer in her maternal grandfather; Stomach cancer in her maternal grandmother.     Assessment/Plan:   Interstitial lung disease with UIP pattern. - serology negative - will arrange for referral to Dr. Kalman Shan with the pulmonary fibrosis foundation for further management  Upper airway cough with allergic rhinitis and elevate IgE. - might also have a component of asthma - will have her try symbicort 80 two puffs bid - continue singulair, flonase, xyzal  Laryngopharyngeal reflux. - could have silent aspiration causing lung scarring  Time Spent Involved in Patient Care on Day of Examination:  37 minutes  Follow up:   Patient Instructions  You have interstitial lung disease.  This is scarring in the lungs.  You will be scheduled for an appointment with Dr. Marchelle Gearing in 6 weeks, who is our interstitial lung disease expert.  Will have you try symbicort two puffs twice per day since you might also have  asthma related to allergies.    Medication List:   Allergies as of 03/03/2023       Reactions   Olmesartan Medoxomil    REACTION: headache        Medication List        Accurate as of March 03, 2023  1:41 PM. If you have any questions, ask your nurse or doctor.          AMBULATORY NON FORMULARY MEDICATION Left wrist brace   Antacid Maximum 1000 MG chewable tablet Generic drug:  calcium elemental as carbonate Chew 1,000 mg by mouth as needed for heartburn.   aspirin EC 81 MG tablet Take 81 mg by mouth daily. Swallow whole.   benzonatate 100 MG capsule Commonly known as: TESSALON Take 1 capsule (100 mg total) by mouth every 8 (eight) hours as needed for cough.   carvedilol 12.5 MG tablet Commonly known as: COREG TAKE 1 TABLET BY MOUTH TWO TIMES A DAY WITH A MEAL   celecoxib 200 MG capsule Commonly known as: CELEBREX Take 1 capsule (200 mg total) by mouth daily.   cholecalciferol 25 MCG (1000 UNIT) tablet Commonly known as: VITAMIN D3 Take 1,000 Units by mouth daily.   CORICIDIN HBP COLD/FLU PO Take by mouth 2 (two) times daily.   DULoxetine 30 MG capsule Commonly known as: CYMBALTA Take 30 mg by mouth daily.   esomeprazole 40 MG capsule Commonly known as: NEXIUM TAKE 1 CAPSULE BY MOUTH 2 TIMES A DAY 30 TO 60 MINUTES BEFORE YOUR FIRST AND LAST MEALS OF THE DAY   famotidine 20 MG tablet Commonly known as: PEPCID Take 1 tablet (20 mg total) by mouth at bedtime.   fluticasone 50 MCG/ACT nasal spray Commonly known as: FLONASE Place 1 spray into both nostrils daily. Begin by using 2 sprays in each nare daily for 3 to 5 days, then decrease to 1 spray in each nare daily.   fluticasone-salmeterol 115-21 MCG/ACT inhaler Commonly known as: Advair HFA Inhale 2 puffs into the lungs 2 (two) times daily. Started by: Coralyn Helling   gabapentin 100 MG capsule Commonly known as: NEURONTIN Take 1 capsule (100 mg total) by mouth at bedtime.   guaiFENesin 200 MG tablet Take 1 tablet (200 mg total) by mouth every 4 (four) hours as needed for cough or to loosen phlegm.   hydrocortisone 2.5 % cream Apply topically 2 (two) times daily.   levocetirizine 5 MG tablet Commonly known as: XYZAL Take 1 tablet (5 mg total) by mouth every evening.   montelukast 10 MG tablet Commonly known as: SINGULAIR Take 1 tablet (10 mg total) by mouth daily.   Olopatadine HCl  0.2 % Soln Apply to eye.   OneTouch Verio Flex System w/Device Kit USE TO TEST BLOOD SUGAR DAILY AS DIRECTED   OneTouch Verio test strip Generic drug: glucose blood USE TO CHECK BLOOD SUGAE TWO TIMES A DAY   QC Tumeric Complex 500 MG Caps Generic drug: Turmeric Take by mouth.   rosuvastatin 20 MG tablet Commonly known as: CRESTOR TAKE 1 TABLET BY MOUTH DAILY   Semaglutide(0.25 or 0.5MG /DOS) 2 MG/3ML Sopn Inject 0.5 mg into the skin once a week.   valsartan 160 MG tablet Commonly known as: DIOVAN Take 1 tablet (160 mg total) by mouth daily.        Signature:  Coralyn Helling, MD [facility]/Critical Care Pager - [phone] 03/03/2023, 1:41 PM

## 2023-03-08 ENCOUNTER — Encounter: Payer: Self-pay | Admitting: Internal Medicine

## 2023-03-08 ENCOUNTER — Ambulatory Visit (INDEPENDENT_AMBULATORY_CARE_PROVIDER_SITE_OTHER): Payer: 59 | Admitting: Internal Medicine

## 2023-03-08 VITALS — BP 110/68 | HR 70 | Ht 68.0 in | Wt 159.0 lb

## 2023-03-08 DIAGNOSIS — E1142 Type 2 diabetes mellitus with diabetic polyneuropathy: Secondary | ICD-10-CM | POA: Diagnosis not present

## 2023-03-08 DIAGNOSIS — Z7985 Long-term (current) use of injectable non-insulin antidiabetic drugs: Secondary | ICD-10-CM | POA: Diagnosis not present

## 2023-03-08 LAB — POCT GLYCOSYLATED HEMOGLOBIN (HGB A1C): Hemoglobin A1C: 5.9 % — AB (ref 4.0–5.6)

## 2023-03-08 LAB — POCT GLUCOSE (DEVICE FOR HOME USE): POC Glucose: 160 mg/dL — AB (ref 70–99)

## 2023-03-08 NOTE — Progress Notes (Signed)
Name: Amy Escobar  Age/ Sex: 77 y.o., female   MRN/ DOB: [id number], May 13, 1946     PCP: Olive Bass, FNP   Reason for Endocrinology Evaluation: Type 2 Diabetes Mellitus  Initial Endocrine Consultative Visit: 11/25/2012    PATIENT IDENTIFIER: Amy Escobar is a 77 y.o. female with a past medical history of T2DM, HTN, non-obstructive CAD , and Dyslipidemia . The patient has followed with Endocrinology clinic since 11/25/2012 for consultative assistance with management of her diabetes.  DIABETIC HISTORY:  Amy Escobar was diagnosed with DM years ago, She has been on Metformin . Her hemoglobin A1c has ranged from 5.6% in 2021, peaking at 7.2% in 2015.  Invokana caused genital infections   Transitioned care from Dr. Everardo All 05/2021  She was started on glimepiride in September 2023 due to hyperglycemia, but this was discontinued as it caused weight gain Started on Ozempic 03/2022    SUBJECTIVE:   During the last visit (08/31/2022): A1c 5.7%     Today (03/08/2023): Amy Escobar  is here for a follow up on diabetes management. She checks her blood sugars 1 times daily. The patient has not had hypoglycemic episodes since the last clinic visit.   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 Denies: CKD  Last Eye Exam: Completed 10/02/2021  Macrovascular complications:  Non-Obstructive CAD - follows with Cardiology  Denies: CVA, PVD   HISTORY:  Past Medical History:  Past Medical History:   Diagnosis Date   Alkaline phosphatase deficiency    w/u Ne   Allergic rhinitis    Allergy    Anxiety    Arthritis    Cataract    BILATERAL-REMOVED   DM2 (diabetes mellitus, type 2) (HCC)    GERD (gastroesophageal reflux disease)    Gout    Hemorrhoids    Hyperlipidemia    Hypertension    Mild pulmonary hypertension (HCC)    NSVT (nonsustained ventricular tachycardia) (HCC)    Osteoporosis    PVC's (premature ventricular contractions)    Thyroid nodule    small   Tubular adenoma of colon 2017   Urticaria    Uterine fibroid    Past Surgical History:  Past Surgical History:  Procedure Laterality Date   CATARACT EXTRACTION Bilateral 2016   COLONOSCOPY  2007   echocardiogram (other)  01/16/2002   POLYPECTOMY     removed tumors from foot nerves  04/1999   stress cardiolite  02/12/2006   TOENAIL EXCISION     TUBAL LIGATION     TYMPANOSTOMY TUBE PLACEMENT     Social History:  reports that she quit smoking about 54 years ago. Her smoking use included cigarettes. She started smoking about 59 years ago. She has a 1.3 pack-year smoking history. She has never used smokeless tobacco. She reports that she does not drink alcohol and does not use drugs. Family History:  Family History  Problem Relation Age of Onset   Heart disease Mother    Allergic rhinitis Mother  Heart disease Father    Stomach cancer Maternal Grandmother    Heart disease Maternal Grandfather    Rectal cancer Maternal Grandfather    Colon cancer Neg Hx    Breast cancer Neg Hx    Colon polyps Neg Hx    Esophageal cancer Neg Hx      HOME MEDICATIONS: Allergies as of 03/08/2023       Reactions   Olmesartan Medoxomil    REACTION: headache        Medication List        Accurate as of March 08, 2023 10:10 AM. If you have any questions, ask your nurse or doctor.          alendronate 70 MG tablet Commonly known as: FOSAMAX Take 70 mg by mouth once a week.   AMBULATORY NON FORMULARY  MEDICATION Left wrist brace   Antacid Maximum 1000 MG chewable tablet Generic drug: calcium elemental as carbonate Chew 1,000 mg by mouth as needed for heartburn.   aspirin EC 81 MG tablet Take 81 mg by mouth daily. Swallow whole.   benzonatate 100 MG capsule Commonly known as: TESSALON Take 1 capsule (100 mg total) by mouth every 8 (eight) hours as needed for cough.   carvedilol 12.5 MG tablet Commonly known as: COREG TAKE 1 TABLET BY MOUTH TWO TIMES A DAY WITH A MEAL   celecoxib 200 MG capsule Commonly known as: CELEBREX Take 1 capsule (200 mg total) by mouth daily.   cholecalciferol 25 MCG (1000 UNIT) tablet Commonly known as: VITAMIN D3 Take 1,000 Units by mouth daily.   CORICIDIN HBP COLD/FLU PO Take by mouth 2 (two) times daily.   DULoxetine 30 MG capsule Commonly known as: CYMBALTA Take 30 mg by mouth daily.   esomeprazole 40 MG capsule Commonly known as: NEXIUM TAKE 1 CAPSULE BY MOUTH 2 TIMES A DAY 30 TO 60 MINUTES BEFORE YOUR FIRST AND LAST MEALS OF THE DAY   famotidine 20 MG tablet Commonly known as: PEPCID Take 1 tablet (20 mg total) by mouth at bedtime.   fluticasone 50 MCG/ACT nasal spray Commonly known as: FLONASE Place 1 spray into both nostrils daily. Begin by using 2 sprays in each nare daily for 3 to 5 days, then decrease to 1 spray in each nare daily.   fluticasone-salmeterol 115-21 MCG/ACT inhaler Commonly known as: Advair HFA Inhale 2 puffs into the lungs 2 (two) times daily.   gabapentin 100 MG capsule Commonly known as: NEURONTIN Take 1 capsule (100 mg total) by mouth at bedtime.   guaiFENesin 200 MG tablet Take 1 tablet (200 mg total) by mouth every 4 (four) hours as needed for cough or to loosen phlegm.   hydrocortisone 2.5 % cream Apply topically 2 (two) times daily.   levocetirizine 5 MG tablet Commonly known as: XYZAL Take 1 tablet (5 mg total) by mouth every evening.   montelukast 10 MG tablet Commonly known as:  SINGULAIR Take 1 tablet (10 mg total) by mouth daily.   Olopatadine HCl 0.2 % Soln Apply to eye.   OneTouch Verio Flex System w/Device Kit USE TO TEST BLOOD SUGAR DAILY AS DIRECTED   OneTouch Verio test strip Generic drug: glucose blood USE TO CHECK BLOOD SUGAE TWO TIMES A DAY   QC Tumeric Complex 500 MG Caps Generic drug: Turmeric Take by mouth.   rosuvastatin 20 MG tablet Commonly known as: CRESTOR TAKE 1 TABLET BY MOUTH DAILY   Semaglutide(0.25 or 0.5MG /DOS) 2 MG/3ML Sopn Inject 0.5 mg into  the skin once a week.   valsartan 160 MG tablet Commonly known as: DIOVAN Take 1 tablet (160 mg total) by mouth daily.         OBJECTIVE:   Vital Signs: BP 110/68 (BP Location: Left Arm, Patient Position: Sitting, Cuff Size: Small)   Pulse 70   Ht 5\' 8"  (1.727 m)   Wt 159 lb (72.1 kg)   SpO2 98%   BMI 24.18 kg/m   Wt Readings from Last 3 Encounters:  03/08/23 159 lb (72.1 kg)  03/03/23 163 lb 3.2 oz (74 kg)  02/01/23 152 lb (68.9 kg)     Exam: General: Pt appears well and is in NAD  Neck: General: Supple without adenopathy. Thyroid: Thyroid size normal.  No goiter or nodules appreciated.  Lungs: Clear with good BS bilat with no rales, rhonchi, or wheezes  Heart: RRR with normal S1 and S2 and no gallops; no murmurs; no rub  Extremities: No pretibial edema.   Neuro: MS is good with appropriate affect, pt is alert and Ox3    DM foot exam: 08/31/2022   The skin of the feet is without sores or ulcerations. The pedal pulses are 2+ on right and 2+ on left. The sensation is intact   to a screening 5.07, 10 gram monofilament on the left and absent at the left great toe           DATA REVIEWED:  Lab Results  Component Value Date   HGBA1C 5.9 (A) 03/08/2023   HGBA1C 5.6 11/19/2022   HGBA1C 5.7 07/31/2022    Latest Reference Range & Units 01/21/23 09:50  Sodium 134 - 144 mmol/L 143  Potassium 3.5 - 5.2 mmol/L 4.2  Chloride 96 - 106 mmol/L 103  CO2 20 - 29  mmol/L 25  Glucose 70 - 99 mg/dL 99  BUN 8 - 27 mg/dL 19  Creatinine 8.11 - 9.14 mg/dL 7.82  Calcium 8.7 - 95.6 mg/dL 9.7  BUN/Creatinine Ratio 12 - 28  23  eGFR >59 mL/min/1.73 75  Alkaline Phosphatase 44 - 121 IU/L 109  Albumin 3.8 - 4.8 g/dL 4.4  AST 0 - 40 IU/L 17  ALT 0 - 32 IU/L 17  Total Protein 6.0 - 8.5 g/dL 7.6  Total Bilirubin 0.0 - 1.2 mg/dL 0.3      ASSESSMENT / PLAN / RECOMMENDATIONS:   1) Type 2 Diabetes Mellitus, Optimally controlled, With Retinopathic, neuropathic and macrovascular complications - Most recent A1c of 5.9 %. Goal A1c < 7.0 %.    - A1c remains at goal.  - She is not interested in restarting Metformin  - Glimepiride caused weight gain - No changes     MEDICATIONS: Continue Ozempic  0.5 milligram weekly  EDUCATION / INSTRUCTIONS: BG monitoring instructions: Patient is instructed to check her blood sugars 1 times a day    2) Diabetic complications:  Eye: Does  have known diabetic retinopathy.  Neuro/ Feet: Does  have known diabetic peripheral neuropathy .  Renal: Patient does not have known baseline CKD. She   is  on an ACEI/ARB at present.   3) Peripheral Neuropathy   -This has been taken over by podiatry -She is currently on gabapentin 100 mg 3 times daily    F/U in 6 months   Signed electronically by: Lyndle Herrlich, MD  [facility] Endocrinology  [facility] [address] [place] [place], [place] [address] Phone: [phone] FAX: [phone]   CC: Olive Bass, FNP [address] [place] [address] Phone: [phone]  Fax: [phone]  Return to Endocrinology clinic as below: Future Appointments  Date Time Provider Department Center  04/20/2023 11:00 AM Kalman Shan, MD [facility] None  05/17/2023  8:00 AM Gaston Islam., NP [facility]  06/01/2023  8:40 AM Olive Bass, FNP [facility]

## 2023-03-08 NOTE — Patient Instructions (Signed)
Continue Ozempic 0.5 mg once weekly       HOW TO TREAT LOW BLOOD SUGARS (Blood sugar LESS THAN 70 MG/DL) Please follow the RULE OF 15 for the treatment of hypoglycemia treatment (when your (blood sugars are less than 70 mg/dL)   STEP 1: Take 15 grams of carbohydrates when your blood sugar is low, which includes:  3-4 GLUCOSE TABS  OR 3-4 OZ OF JUICE OR REGULAR SODA OR ONE TUBE OF GLUCOSE GEL    STEP 2: RECHECK blood sugar in 15 MINUTES STEP 3: If your blood sugar is still low at the 15 minute recheck --> then, go back to STEP 1 and treat AGAIN with another 15 grams of carbohydrates.

## 2023-03-10 DIAGNOSIS — M792 Neuralgia and neuritis, unspecified: Secondary | ICD-10-CM | POA: Diagnosis not present

## 2023-03-23 ENCOUNTER — Telehealth: Payer: Self-pay | Admitting: Gastroenterology

## 2023-03-23 NOTE — Telephone Encounter (Signed)
Inbound call from patient stating she is having issues with moving her bowels. Patient is requesting a call to discuss recommendations. Please advise.

## 2023-03-24 NOTE — Telephone Encounter (Signed)
I spoke with the Amy Escobar and she states that she has been constipated for years and the metamucil and milk of magnesia has not been working as well.  She did say that she took miralax yesterday and this has seemed to help.  We discussed that she can titrate up and down as needed to maintain her bowel habits.  She will call back if this does not resolve her symptoms.  She was last seen last year for colon- I did advise that she should make an appt if she continues to have issues with constipation. The Amy Escobar has been advised of the information and verbalized understanding.

## 2023-03-30 ENCOUNTER — Other Ambulatory Visit: Payer: Self-pay | Admitting: Physician Assistant

## 2023-04-08 DIAGNOSIS — L84 Corns and callosities: Secondary | ICD-10-CM | POA: Diagnosis not present

## 2023-04-08 DIAGNOSIS — E1142 Type 2 diabetes mellitus with diabetic polyneuropathy: Secondary | ICD-10-CM | POA: Diagnosis not present

## 2023-04-08 DIAGNOSIS — E1151 Type 2 diabetes mellitus with diabetic peripheral angiopathy without gangrene: Secondary | ICD-10-CM | POA: Diagnosis not present

## 2023-04-08 DIAGNOSIS — I739 Peripheral vascular disease, unspecified: Secondary | ICD-10-CM | POA: Diagnosis not present

## 2023-04-08 DIAGNOSIS — L603 Nail dystrophy: Secondary | ICD-10-CM | POA: Diagnosis not present

## 2023-04-13 NOTE — Progress Notes (Signed)
Amy Escobar D.[facility] [address] [place] [address] Phone: [phone]   Assessment and Plan:     1. Chronic pain of both knees 2. Primary osteoarthritis of both knees  -Chronic with exacerbation, subsequent visit - Patient presents with recurrent flare of bilateral knee pain consistent with meniscal tears in arthritic changes seen on prior MRIs - Patient is not interested in surgical intervention at this time and wants to proceed with injection therapy to decrease overall pain - Patient elected for bilateral intra-articular knee CSI.  Tolerated well per note below.  CSI may temporarily increase blood glucose in patient with past medical history of DM type II - Patient has only received mild and temporary benefit from CSI in the past, so we will order HA injections for bilateral knees, obtain prior authorization, and follow-up with patient for injection visit - May use Tylenol for day-to-day pain  Procedure: Knee Joint Injection Side: Bilateral Indication: Flare of osteoarthritis  Risks explained and consent was given verbally. The site was cleaned with alcohol prep. A needle was introduced with an anterio-lateral approach. Injection given using 2mL of 1% lidocaine without epinephrine and 1mL of kenalog 40mg /ml. This was well tolerated and resulted in symptomatic relief.  Needle was removed, hemostasis achieved, and post injection instructions were explained.  Procedure was repeated on contralateral side.  Pt was advised to call or return to clinic if these symptoms worsen or fail to improve as anticipated.   Pertinent previous records reviewed include none   Follow Up: We will contact patient once prior authorization has been obtained for bilateral HA injection   Subjective:   I, Amy Escobar, am serving as a Neurosurgeon for Doctor Richardean Sale   Chief Complaint: bilateral knee injection    HPI:  09/30/2021 Patient given injection  today and tolerated the procedure well, discussed icing regimen and home exercise, discussed which activities to do which wants to avoid.  Discussed which activities to potentially avoid.  Patient does not want to do the home exercises on a regular basis at the moment.  Follow-up with me again 6 to 8 weeks.  Can repeat every 3 months if needed   Chronic, with exacerbation.  Has had this difficulty for some time.  Discussed the possibility of a lumbar radiculopathy with radicular symptoms.  Discussed which activities to do which ones to avoid.  Follow-up again in 3 months   Update 12/09/2021 Amy Escobar is a 77 y.o. female coming in with complaint of B hip and B knee pain. Saw Dr. Jean Rosenthal twice in May 2023 for shoulder, elbow, and wrist pain after a fall. Worsening pain in the knees  Patient states still sore from fall. Time for injections in knees and wondering about GT injections because she has to see a kidney doctor.     Patient when seen another provider was found to have a possible lung nodule.  Was sent for a CT chest with contrast that did not show any significant nodule noted on CT scan but was found to have significant right hydronephrosis.  And then was sent for CT abdomen pelvis which did show that there was a significant right-sided hydronephrosis with possible stricture and no sign of true stone formation.   Patient also GFR has been decreased with most recent labs of 59.8   02/27/2022 Patient states wants knee and injection and hip injections   03/11/2022 Amy Escobar is a 77 y.o. female coming in with  complaint of B knee pain. Injected by Dr. Jean Rosenthal on 02/27/2022. Durolane approved. Patient states that she is would like injections in backside and gel injections.    04/14/2022 Patient states she is in a lot of pain  , her left knee goes out and her right knee hurts a lot    04/22/2022 Patient states still in pain , left is worse than right   04/26/2023 Patient states here  for injection wants to discuss where to put them , has been working at [employer]    Relevant Historical Information: DM type II, hypertension  Additional pertinent review of systems negative.   Current Outpatient Medications:    alendronate (FOSAMAX) 70 MG tablet, Take 70 mg by mouth once a week., Disp: , Rfl:    AMBULATORY NON FORMULARY MEDICATION, Left wrist brace, Disp: 1 Piece, Rfl: 0   aspirin EC 81 MG tablet, Take 81 mg by mouth daily. Swallow whole., Disp: , Rfl:    benzonatate (TESSALON) 100 MG capsule, Take 1 capsule (100 mg total) by mouth every 8 (eight) hours as needed for cough., Disp: 21 capsule, Rfl: 0   Blood Glucose Monitoring Suppl (ONETOUCH VERIO FLEX SYSTEM) w/Device KIT, USE TO TEST BLOOD SUGAR DAILY AS DIRECTED, Disp: 1 kit, Rfl: 0   calcium elemental as carbonate (ANTACID MAXIMUM) 400 MG chewable tablet, Chew 1,000 mg by mouth as needed for heartburn., Disp: , Rfl:    carvedilol (COREG) 12.5 MG tablet, TAKE 1 TABLET BY MOUTH TWO TIMES A DAY WITH A MEAL, Disp: 180 tablet, Rfl: 2   celecoxib (CELEBREX) 200 MG capsule, TAKE 1 CAPSULE BY MOUTH DAILY, Disp: 30 capsule, Rfl: 1   Chlorpheniramine-Acetaminophen (CORICIDIN HBP COLD/FLU PO), Take by mouth 2 (two) times daily., Disp: , Rfl:    cholecalciferol (VITAMIN D3) 25 MCG (1000 UNIT) tablet, Take 1,000 Units by mouth daily., Disp: , Rfl:    DULoxetine (CYMBALTA) 30 MG capsule, Take 30 mg by mouth daily., Disp: , Rfl:    esomeprazole (NEXIUM) 40 MG capsule, Take 1 capsule (40 mg total) by mouth 2 (two) times daily before a meal., Disp: 180 capsule, Rfl: 0   famotidine (PEPCID) 20 MG tablet, Take 1 tablet (20 mg total) by mouth at bedtime., Disp: 90 tablet, Rfl: 2   fluticasone (FLONASE) 50 MCG/ACT nasal spray, Place 1 spray into both nostrils daily. Begin by using 2 sprays in each nare daily for 3 to 5 days, then decrease to 1 spray in each nare daily., Disp: 15.8 mL, Rfl: 2   fluticasone-salmeterol (ADVAIR HFA)  115-21 MCG/ACT inhaler, Inhale 2 puffs into the lungs 2 (two) times daily., Disp: 1 each, Rfl: 12   gabapentin (NEURONTIN) 100 MG capsule, Take 1 capsule (100 mg total) by mouth at bedtime., Disp: 90 capsule, Rfl: 3   glucose blood (ONETOUCH VERIO) test strip, USE TO CHECK BLOOD SUGAE TWO TIMES A DAY, Disp: 100 strip, Rfl: 3   guaiFENesin 200 MG tablet, Take 1 tablet (200 mg total) by mouth every 4 (four) hours as needed for cough or to loosen phlegm., Disp: 30 tablet, Rfl: 0   hydrocortisone 2.5 % cream, Apply topically 2 (two) times daily., Disp: 30 g, Rfl: 0   levocetirizine (XYZAL) 5 MG tablet, Take 1 tablet (5 mg total) by mouth every evening., Disp: 90 tablet, Rfl: 1   montelukast (SINGULAIR) 10 MG tablet, Take 1 tablet (10 mg total) by mouth daily., Disp: 90 tablet, Rfl: 2   Olopatadine HCl 0.2 % SOLN,  Apply to eye., Disp: , Rfl:    rosuvastatin (CRESTOR) 20 MG tablet, TAKE 1 TABLET BY MOUTH DAILY, Disp: 90 tablet, Rfl: 2   Semaglutide,0.25 or 0.5MG /DOS, 2 MG/3ML SOPN, Inject 0.5 mg into the skin once a week., Disp: 6 mL, Rfl: 3   Turmeric (QC TUMERIC COMPLEX) 500 MG CAPS, Take by mouth., Disp: , Rfl:    valsartan (DIOVAN) 160 MG tablet, Take 1 tablet (160 mg total) by mouth daily., Disp: 90 tablet, Rfl: 3   Objective:     Vitals:   04/26/23 1421  Weight: 160 lb (72.6 kg)  Height: 5\' 4"  (1.626 m)      Body mass index is 27.46 kg/m.    Physical Exam:    General: Nontoxic, pleasant, alert Skin: no suspicious lesions or rashes Neuro:sensation intact, no deficits, strength 5/5 with no deficits, no atrophy, normal muscle tone Psych: No signs of anxiety, depression or other mood disorder   Bilateral knee: mild swelling No deformity Neg fluid wave, joint milking ROM Flex 100, Ext 5 TTP medial lateral joint line NTTP over the quad tendon, medial fem condyle, lat fem condyle, patella, plica, patella tendon, tibial tuberostiy, fibular head, posterior fossa, pes anserine bursa,  gerdy's tubercle, medial jt line, lateral jt line McMurray causes pain bilaterally, but no palpable pop Negative anterior/posterior drawer   Gait slow with short steps.     Electronically signed by:  Amy Escobar D.[facility] 4:12 PM 04/26/23

## 2023-04-20 ENCOUNTER — Other Ambulatory Visit (INDEPENDENT_AMBULATORY_CARE_PROVIDER_SITE_OTHER): Payer: 59

## 2023-04-20 ENCOUNTER — Telehealth: Payer: Self-pay | Admitting: Internal Medicine

## 2023-04-20 ENCOUNTER — Encounter: Payer: Self-pay | Admitting: Internal Medicine

## 2023-04-20 ENCOUNTER — Ambulatory Visit (INDEPENDENT_AMBULATORY_CARE_PROVIDER_SITE_OTHER): Payer: 59 | Admitting: Internal Medicine

## 2023-04-20 VITALS — BP 152/86 | HR 86 | Ht 64.5 in | Wt 159.6 lb

## 2023-04-20 DIAGNOSIS — J302 Other seasonal allergic rhinitis: Secondary | ICD-10-CM | POA: Diagnosis not present

## 2023-04-20 DIAGNOSIS — R768 Other specified abnormal immunological findings in serum: Secondary | ICD-10-CM | POA: Diagnosis not present

## 2023-04-20 DIAGNOSIS — Z9109 Other allergy status, other than to drugs and biological substances: Secondary | ICD-10-CM | POA: Diagnosis not present

## 2023-04-20 DIAGNOSIS — J849 Interstitial pulmonary disease, unspecified: Secondary | ICD-10-CM

## 2023-04-20 DIAGNOSIS — K219 Gastro-esophageal reflux disease without esophagitis: Secondary | ICD-10-CM | POA: Diagnosis not present

## 2023-04-20 DIAGNOSIS — J84112 Idiopathic pulmonary fibrosis: Secondary | ICD-10-CM | POA: Diagnosis not present

## 2023-04-20 LAB — HEPATIC FUNCTION PANEL
ALT: 14 U/L (ref 0–35)
AST: 17 U/L (ref 0–37)
Albumin: 4.2 g/dL (ref 3.5–5.2)
Alkaline Phosphatase: 85 U/L (ref 39–117)
Bilirubin, Direct: 0.1 mg/dL (ref 0.0–0.3)
Total Bilirubin: 0.3 mg/dL (ref 0.2–1.2)
Total Protein: 7.9 g/dL (ref 6.0–8.3)

## 2023-04-20 NOTE — Patient Instructions (Addendum)
ILD (interstitial lung disease) (HCC) IPF (idiopathic pulmonary fibrosis) (HCC)  -Giving a provisional diagnosis of idiopathic pulmonary fibrosis [IPF].  This is based on age greater than 90, disease predominantly in the lower lobe, probable UIP description on the CT scan, progression since 2020 [he had normal pulmonary function test in 2015], and near normal serology blood work  Plan  - Take ILD questionnaire and fill this up - Start pirfenidone per protocol  - Check LFT 04/20/2023  -Discussed this medicine in detail  -CMA to initiate paperwork -Clinical trial considerations in the future'   GERD  Plan  - Address at next visit including Fosamax use  Seasonal allergies Elevated IgE level House dust mite allergy   Plan   - refer allergist - continue curernt inahler  Follow-up - 6-8-week visit with Dr. Marchelle Gearing or nurse practitioner 30-minute to see pirfenidone uptake

## 2023-04-20 NOTE — Telephone Encounter (Signed)
   Rx team  Amy Escobar - esbreit start for IPF. I counseled  Thanks    SIGNATURE    Dr. Kalman Shan, M.D., F.C.C.P,  Pulmonary and Critical Care Medicine Staff Physician, [facility] Director - Interstitial Lung Disease  Program  Pulmonary Fibrosis [facility] at [facility] [place], [place], [address]   Pager: [phone], If no answer  -> Check AMION or Try [phone] Telephone (clinical office): [phone] Telephone (research): [phone]  11:45 AM 04/20/2023

## 2023-04-20 NOTE — Progress Notes (Signed)
OV 04/20/2023 -of care to the ILD center.  Referred by Dr. Craige Cotta.  Subjective:  Patient ID: Amy Escobar, female , DOB: 02/23/1946 , age 77 y.o. , MRN: [id number] , ADDRESS: [address] [place] [address] PCP Olive Bass, FNP Patient Care Team: Olive Bass, FNP as PCP - General (Internal Medicine) Meriam Sprague, MD (Inactive) as PCP - Cardiology (Cardiology) Lurena Nida, MD (Internal Medicine) Henreitta Leber, PA-C as Physician Assistant (Obstetrics and Gynecology) Lyn Records, MD (Inactive) as Consulting Physician (Cardiology) Louis Meckel, MD (Inactive) as Consulting Physician (Gastroenterology) Luciana Axe Alford Highland, MD as Consulting Physician (Ophthalmology) Nyoka Cowden, MD as Consulting Physician (Pulmonary Disease) Judi Saa, DO as Attending Physician (Family Medicine) Helane Gunther, DPM as Consulting Physician (Podiatry)  This Provider for this visit: Treatment Team:  Attending Provider: Kalman Shan, MD    04/20/2023 -   Chief Complaint  Patient presents with   Follow-up    Pt was put on symbicort and she does take it everyday. Pt is f/u Ild has concerns about the scarring      HPI Amy Escobar 77 y.o. -I am meeting for the first time.  Presents with daughter Amy Escobar.  Both independent historians.  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  As best as I can gather patient is to be followed by Dr. Sandrea Hughs.  4 years ago there was significant allergy testing positivity with seasonal allergy positive to grass and dust including dust mite and Alternaria and pet dander.  She also has a elevated IgE greater than 1000 sometimes even 2000.  She has been having cough for the last 5 to 6 years present both day and night.  Is associated with acid reflux decreased by Mucinex considered moderate to severe.  Also shortness of breath for the last 2 or 3 years years  particular when she climbs stairs but not for other simple activities of daily living.  She saw Dr. Craige Cotta who did a high-resolution CT chest.  See below.  I personally visualized his agree with probable UIP pattern.  Therefore she has been referred here.  She denies any mold exposure of bird feather or down comforter exposure. That she is noticed to have acid reflux but also on Fosamax.   She has had serologies trace positive for rheumatoid factor but otherwise negative. - Medications reviewed.   SYMPTOM SCALE - ILD 04/20/2023  Current weight   O2 use ra  Shortness of Breath 0 -> 5 scale with 5 being worst (score 6 If unable to do)  At rest 0  Simple tasks - showers, clothes change, eating, shaving 0  Household (dishes, doing bed, laundry) 2  Shopping 3  Walking level at own pace 3  Walking up Stairs 5  Total (30-36) Dyspnea Score 18  How bad is your cough? 2  How bad is your fatigue 3  How bad is nausea 0  How bad is vomiting?  0  How bad is diarrhea? 0  How bad is anxiety? 0  How bad is depression 0  Any chronic pain - if so where and how bad 0      CT Chest data from date:   ive & Impression  CLINICAL DATA:  Abnormal chest radiograph. Evaluate for interstitial lung disease.   EXAM: CT CHEST WITHOUT CONTRAST   TECHNIQUE: Multidetector CT imaging of the chest was performed following the standard protocol  without intravenous contrast. High resolution imaging of the lungs, as well as inspiratory and expiratory imaging, was performed.   RADIATION DOSE REDUCTION: This exam was performed according to the departmental dose-optimization program which includes automated exposure control, adjustment of the mA and/or kV according to patient size and/or use of iterative reconstruction technique.   COMPARISON:  01/14/2023 chest radiograph.   FINDINGS: Cardiovascular: Normal heart size. No significant pericardial effusion/thickening. Left anterior descending  coronary atherosclerosis. Atherosclerotic nonaneurysmal thoracic aorta. Normal caliber pulmonary arteries.   Mediastinum/Nodes: No significant thyroid nodules. Unremarkable esophagus. No pathologically enlarged axillary, mediastinal or hilar lymph nodes, noting limited sensitivity for the detection of hilar adenopathy on this noncontrast study.   Lungs/Pleura: No pneumothorax. No pleural effusion. No acute consolidative airspace disease, lung masses or significant pulmonary nodules. No significant lobular air trapping or evidence of tracheobronchomalacia on the expiration sequence. Moderate patchy confluent subpleural reticulation and ground-glass opacity throughout both lungs with associated moderate traction bronchiectasis and architectural distortion. There is a mild basilar predominance to these findings. No frank honeycombing. Comparison to the 11/10/2021 chest CT study is limited by prominent motion degradation on the prior scan, favor mild interval progression.   Upper abdomen: No acute abnormality.   Musculoskeletal: No aggressive appearing focal osseous lesions. Mild thoracic spondylosis.   IMPRESSION: 1. Spectrum of findings compatible with basilar predominant fibrotic interstitial lung disease without honeycombing. Comparison to the 11/10/2021 chest CT study is limited by prominent motion degradation on the prior scan, favor mild interval progression. Findings are categorized as probable UIP per consensus guidelines: Diagnosis of Idiopathic Pulmonary Fibrosis: An Official ATS/ERS/JRS/ALAT Clinical Practice Guideline. Am Rosezetta Schlatter Crit Care Med Vol 198, Iss 5, [phone], Feb 27 2017. 2. One vessel coronary atherosclerosis. 3.  Aortic Atherosclerosis (ICD10-I70.0).     Electronically Signed   By: Delbert Phenix M.D.   On: 02/04/2023 14:22    Latest Reference Range & Units 01/28/07 09:21 03/26/08 11:40 06/19/09 07:31 12/20/09 10:02 03/09/10 09:13 07/07/10 07:51 07/31/11  08:24 08/05/12 09:12 08/04/13 14:09 04/03/14 11:14 08/06/14 09:03 11/16/14 11:26 08/09/15 09:09 08/12/16 08:18 07/04/18 15:54 08/22/18 09:40 11/30/18 12:26 12/15/19 16:07 02/13/20 10:08 04/05/20 10:20 07/26/20 16:11 02/07/21 11:20 09/16/21 13:27 11/18/21 08:37 03/03/22 08:49 08/07/22 13:30  Eosinophils Absolute 0.0 - 0.7 K/uL 0.4 0.2 0.3 0.2 0.3 0.2 0.2 0.7 0.5 0.2 0.4 0.3 0.2 0.3 0.2 0.2 0.1 0.2 214 0.3 0.2 0.2 0.1 0.3 0.1 0.2     Latest Reference Range & Units 01/21/23 09:50  Immature Granulocytes Not Estab. % 0  Immature Grans (Abs) 0.0 - 0.1 x10E3/uL 0.0  IgE (Immunoglobulin E), Serum 6 - 495 IU/mL 1,646 (H)  ANA Titer 1  Negative  dsDNA Ab 0 - 9 IU/mL <1  ENA RNP Ab 0.0 - 0.9 AI 0.3  ENA SSA (RO) Ab 0.0 - 0.9 AI <0.2  ENA SSB (LA) Ab 0.0 - 0.9 AI <0.2  RA Latex Turbid. <14.0 IU/mL 15.5 (H)  ENA SM Ab Ser-aCnc 0.0 - 0.9 AI <0.2  Scleroderma (Scl-70) (ENA) Antibody, IgG 0.0 - 0.9 AI <0.2  (H): Data is abnormally high PFT     Latest Ref Rng & Units 01/25/2023    8:33 AM 07/08/2020   10:05 AM 09/12/2013   11:57 AM  ILD indicators  FVC-Pre L 1.96  1.75  2.53   FVC-Predicted Pre % 71  78  103   FVC-Post L 2.05  1.54  2.42   FVC-Predicted Post % 74  68  99   TLC L  4.32  2.94  4.12   TLC Predicted % 85  58  81   DLCO uncorrected ml/min/mmHg 10.18  21.45  17.08   DLCO UNC %Pred % 53  111  70   DLCO Corrected ml/min/mmHg 10.18  21.45    DLCO COR %Pred % 53  111      Latest Reference Range & Units 08/22/18 09:40  Sheep Sorrel IgE kU/L 0.53 (H)  Pecan/Hickory Tree IgE kU/L 0.55 (H)  IgE (Immunoglobulin E), Serum <OR=114 kU/L 2,095 (H)  Allergen, D pternoyssinus,d7 kU/L 1.00 (H)  Cat Dander kU/L <0.10  Dog Dander kU/L 0.31 (H)  [place] Grass kU/L 0.57 (H)  [place] Grass kU/L 0.85 (H)  Timothy Grass kU/L 0.46 (H)  Cockroach kU/L 0.55 (H)  Aspergillus fumigatus, m3 kU/L 0.41 (H)  Allergen, Comm Silver Charletta Cousin, t9 kU/L 0.51 (H)  Allergen, Cottonwood, t14 kU/L 0.80 (H)  Elm IgE kU/L  1.04 (H)  Allergen, Mulberry, t76 kU/L 0.19 (H)  Allergen, Oak,t7 kU/L 0.43 (H)  COMMON RAGWEED (SHORT) (W1) IGE kU/L 1.09 (H)  Allergen, Mouse Urine Protein, e78 kU/L <0.10  D. farinae kU/L 1.04 (H)  Allergen, Cedar tree, t12 kU/L 0.68 (H)  Box Elder IgE kU/L 0.78 (H)  Rough Pigweed  IgE kU/L 0.53 (H)  (H): Data is abnormally high   LAB RESULTS last 96 hours No results found.  LAB RESULTS last 90 days Recent Results (from the past 2160 hour(s))  IgE     Status: Abnormal   Collection Time: 01/21/23  9:50 AM  Result Value Ref Range   IgE (Immunoglobulin E), Serum 1,646 (H) 6 - 495 IU/mL  CBC with Differential/Platelet     Status: Abnormal   Collection Time: 01/21/23  9:50 AM  Result Value Ref Range   WBC 10.5 3.4 - 10.8 x10E3/uL   RBC 4.86 3.77 - 5.28 x10E6/uL   Hemoglobin 12.8 11.1 - 15.9 g/dL   Hematocrit 40.9 81.1 - 46.6 %   MCV 85 79 - 97 fL   MCH 26.3 (L) 26.6 - 33.0 pg   MCHC 31.1 (L) 31.5 - 35.7 g/dL   RDW 91.4 78.2 - 95.6 %   Platelets 227 150 - 450 x10E3/uL   Neutrophils 59 Not Estab. %   Lymphs 31 Not Estab. %   Monocytes 9 Not Estab. %   Eos 1 Not Estab. %   Basos 0 Not Estab. %   Neutrophils Absolute 6.2 1.4 - 7.0 x10E3/uL   Lymphocytes Absolute 3.2 (H) 0.7 - 3.1 x10E3/uL   Monocytes Absolute 0.9 0.1 - 0.9 x10E3/uL   EOS (ABSOLUTE) 0.1 0.0 - 0.4 x10E3/uL   Basophils Absolute 0.0 0.0 - 0.2 x10E3/uL   Immature Granulocytes 0 Not Estab. %   Immature Grans (Abs) 0.0 0.0 - 0.1 x10E3/uL  Comp Met (CMET)     Status: None   Collection Time: 01/21/23  9:50 AM  Result Value Ref Range   Glucose 99 70 - 99 mg/dL   BUN 19 8 - 27 mg/dL   Creatinine, Ser 2.13 0.57 - 1.00 mg/dL   eGFR 75 >08 MV/HQI/6.96   BUN/Creatinine Ratio 23 12 - 28   Sodium 143 134 - 144 mmol/L   Potassium 4.2 3.5 - 5.2 mmol/L   Chloride 103 96 - 106 mmol/L   CO2 25 20 - 29 mmol/L   Calcium 9.7 8.7 - 10.3 mg/dL   Total Protein 7.6 6.0 - 8.5 g/dL   Albumin 4.4 3.8 - 4.8 g/dL   Globulin,  Total 3.2 1.5 - 4.5 g/dL   Bilirubin Total 0.3 0.0 - 1.2 mg/dL   Alkaline Phosphatase 109 44 - 121 IU/L   AST 17 0 - 40 IU/L   ALT 17 0 - 32 IU/L  ANA+ENA+DNA/DS+Scl 70+SjoSSA/B     Status: None   Collection Time: 01/21/23  9:50 AM  Result Value Ref Range   ANA Titer 1 Negative     Comment:                                      Negative   <1:80                                      Borderline  1:80                                      Positive   >1:80 ICAP nomenclature: AC-0 For more information about Hep-2 cell patterns use ANApatterns.org, the official website for the International Consensus on Antinuclear Antibody (ANA) Patterns (ICAP).    dsDNA Ab <1 0 - 9 IU/mL    Comment:                                    Negative      <5                                    Equivocal  5 - 9                                    Positive      >9    ENA RNP Ab 0.3 0.0 - 0.9 AI   ENA SM Ab Ser-aCnc <0.2 0.0 - 0.9 AI   Scleroderma (Scl-70) (ENA) Antibody, IgG <0.2 0.0 - 0.9 AI   ENA SSA (RO) Ab <0.2 0.0 - 0.9 AI   ENA SSB (LA) Ab <0.2 0.0 - 0.9 AI  Rheumatoid Factor     Status: Abnormal   Collection Time: 01/21/23  9:50 AM  Result Value Ref Range   Rheumatoid fact SerPl-aCnc 15.5 (H) <14.0 IU/mL  Pulmonary Function Test     Status: None   Collection Time: 01/25/23  8:33 AM  Result Value Ref Range   FVC-Pre 1.96 L   FVC-%Pred-Pre 71 %   FVC-Post 2.05 L   FVC-%Pred-Post 74 %   FVC-%Change-Post 4 %   FEV1-Pre 1.59 L   FEV1-%Pred-Pre 77 %   FEV1-Post 1.74 L   FEV1-%Pred-Post 85 %   FEV1-%Change-Post 9 %   FEV6-Pre 1.91 L   FEV6-%Pred-Pre 73 %   FEV6-Post 2.03 L   FEV6-%Pred-Post 78 %   FEV6-%Change-Post 6 %   Pre FEV1/FVC ratio 81 %   FEV1FVC-%Pred-Pre 108 %   Post FEV1/FVC ratio 85 %   FEV1FVC-%Change-Post 4 %   Pre FEV6/FVC Ratio 100 %   FEV6FVC-%Pred-Pre 105 %   Post FEV6/FVC ratio 99 %   FEV6FVC-%Pred-Post 104 %   FEV6FVC-%Change-Post 0 %   FEF 25-75  Pre 1.63 L/sec    FEF2575-%Pred-Pre 104 %   FEF 25-75 Post 2.66 L/sec   FEF2575-%Pred-Post 170 %   FEF2575-%Change-Post 62 %   RV 2.72 L   RV % pred 116 %   TLC 4.32 L   TLC % pred 85 %   DLCO unc 10.18 ml/min/mmHg   DLCO unc % pred 53 %   DLCO cor 10.18 ml/min/mmHg   DLCO cor % pred 53 %   DL/VA 1.61 ml/min/mmHg/L   DL/VA % pred 76 %  SARS CORONAVIRUS 2 (TAT 6-24 HRS) Anterior Nasal Swab     Status: Abnormal   Collection Time: 02/01/23  1:30 PM   Specimen: Anterior Nasal Swab  Result Value Ref Range   SARS Coronavirus 2 POSITIVE (A) NEGATIVE    Comment: (NOTE) SARS-CoV-2 target nucleic acids are DETECTED.  The SARS-CoV-2 RNA is generally detectable in upper and lower respiratory specimens during the acute phase of infection. Positive results are indicative of the presence of SARS-CoV-2 RNA. Clinical correlation with patient history and other diagnostic information is  necessary to determine patient infection status. Positive results do not rule out bacterial infection or co-infection with other viruses.  The expected result is Negative.  Fact Sheet for Patients: HairSlick.no  Fact Sheet for Healthcare Providers: quierodirigir.com  This test is not yet approved or cleared by the [place] FDA and  has been authorized for detection and/or diagnosis of SARS-CoV-2 by FDA under an Emergency Use Authorization (EUA). This EUA will remain  in effect (meaning this test can be used) for the duration of the COVID-19 declaration under Section 564(b)(1) of the Act, 21 U. S.C. section 360bbb-3(b)(1), unless the authorization is terminated or revoked sooner.   Performed at [facility], [address] [place], [place] [address]   POCT Glucose (Device for Home Use)     Status: Abnormal   Collection Time: 03/08/23  9:54 AM  Result Value Ref Range   Glucose Fasting, POC     POC Glucose 160 (A) 70 - 99 mg/dl  POCT glycosylated  hemoglobin (Hb A1C)     Status: Abnormal   Collection Time: 03/08/23 10:01 AM  Result Value Ref Range   Hemoglobin A1C 5.9 (A) 4.0 - 5.6 %   HbA1c POC (<> result, manual entry)     HbA1c, POC (prediabetic range)     HbA1c, POC (controlled diabetic range)           has a past medical history of Alkaline phosphatase deficiency, Allergic rhinitis, Allergy, Anxiety, Arthritis, Cataract, DM2 (diabetes mellitus, type 2) (HCC), GERD (gastroesophageal reflux disease), Gout, Hemorrhoids, Hyperlipidemia, Hypertension, Mild pulmonary hypertension (HCC), NSVT (nonsustained ventricular tachycardia) (HCC), Osteoporosis, PVC's (premature ventricular contractions), Thyroid nodule, Tubular adenoma of colon (2017), Urticaria, and Uterine fibroid.   reports that she quit smoking about 54 years ago. Her smoking use included cigarettes. She started smoking about 59 years ago. She has a 1.3 pack-year smoking history. She has never used smokeless tobacco.  Past Surgical History:  Procedure Laterality Date   CATARACT EXTRACTION Bilateral 2016   COLONOSCOPY  2007   echocardiogram (other)  01/16/2002   POLYPECTOMY     removed tumors from foot nerves  04/1999   stress cardiolite  02/12/2006   TOENAIL EXCISION     TUBAL LIGATION     TYMPANOSTOMY TUBE PLACEMENT      Allergies  Allergen Reactions   Olmesartan Medoxomil     REACTION: headache    Immunization History  Administered Date(s) Administered   Fluad Quad(high Dose 65+) 04/18/2019   Influenza Split 03/23/2011, 04/07/2013   Influenza Whole 04/13/2008, 04/29/2009, 03/29/2010, 03/29/2018   Influenza, High Dose Seasonal PF 03/11/2023   Influenza-Unspecified 04/08/2012, 03/28/2014, 03/14/2015, 03/13/2016, 04/15/2020   PFIZER(Purple Top)SARS-COV-2 Vaccination 08/13/2019, 09/05/2019, 04/15/2020   PPD Test 03/23/2011   Pfizer Covid-19 Vaccine Bivalent Booster 19yrs & up 04/29/2021   Pneumococcal Conjugate-13 01/10/2014   Pneumococcal  Polysaccharide-23 07/07/2010, 03/31/2018   Td 12/27/2001   Tdap 08/06/2014   Zoster Recombinant(Shingrix) 03/31/2018    Family History  Problem Relation Age of Onset   Heart disease Mother    Allergic rhinitis Mother    Heart disease Father    Stomach cancer Maternal Grandmother    Heart disease Maternal Grandfather    Rectal cancer Maternal Grandfather    Colon cancer Neg Hx    Breast cancer Neg Hx    Colon polyps Neg Hx    Esophageal cancer Neg Hx      Current Outpatient Medications:    alendronate (FOSAMAX) 70 MG tablet, Take 70 mg by mouth once a week., Disp: , Rfl:    AMBULATORY NON FORMULARY MEDICATION, Left wrist brace, Disp: 1 Piece, Rfl: 0   aspirin EC 81 MG tablet, Take 81 mg by mouth daily. Swallow whole., Disp: , Rfl:    benzonatate (TESSALON) 100 MG capsule, Take 1 capsule (100 mg total) by mouth every 8 (eight) hours as needed for cough., Disp: 21 capsule, Rfl: 0   Blood Glucose Monitoring Suppl (ONETOUCH VERIO FLEX SYSTEM) w/Device KIT, USE TO TEST BLOOD SUGAR DAILY AS DIRECTED, Disp: 1 kit, Rfl: 0   calcium elemental as carbonate (ANTACID MAXIMUM) 400 MG chewable tablet, Chew 1,000 mg by mouth as needed for heartburn., Disp: , Rfl:    carvedilol (COREG) 12.5 MG tablet, TAKE 1 TABLET BY MOUTH TWO TIMES A DAY WITH A MEAL, Disp: 180 tablet, Rfl: 2   celecoxib (CELEBREX) 200 MG capsule, TAKE 1 CAPSULE BY MOUTH DAILY, Disp: 30 capsule, Rfl: 1   Chlorpheniramine-Acetaminophen (CORICIDIN HBP COLD/FLU PO), Take by mouth 2 (two) times daily., Disp: , Rfl:    cholecalciferol (VITAMIN D3) 25 MCG (1000 UNIT) tablet, Take 1,000 Units by mouth daily., Disp: , Rfl:    DULoxetine (CYMBALTA) 30 MG capsule, Take 30 mg by mouth daily., Disp: , Rfl:    esomeprazole (NEXIUM) 40 MG capsule, TAKE 1 CAPSULE BY MOUTH 2 TIMES A DAY 30 TO 60 MINUTES BEFORE YOUR FIRST AND LAST MEALS OF THE DAY, Disp: 180 capsule, Rfl: 0   famotidine (PEPCID) 20 MG tablet, Take 1 tablet (20 mg total) by mouth at  bedtime., Disp: 90 tablet, Rfl: 2   fluticasone (FLONASE) 50 MCG/ACT nasal spray, Place 1 spray into both nostrils daily. Begin by using 2 sprays in each nare daily for 3 to 5 days, then decrease to 1 spray in each nare daily., Disp: 15.8 mL, Rfl: 2   fluticasone-salmeterol (ADVAIR HFA) 115-21 MCG/ACT inhaler, Inhale 2 puffs into the lungs 2 (two) times daily., Disp: 1 each, Rfl: 12   gabapentin (NEURONTIN) 100 MG capsule, Take 1 capsule (100 mg total) by mouth at bedtime., Disp: 90 capsule, Rfl: 3   glucose blood (ONETOUCH VERIO) test strip, USE TO CHECK BLOOD SUGAE TWO TIMES A DAY, Disp: 100 strip, Rfl: 3   guaiFENesin 200 MG tablet, Take 1 tablet (200 mg total) by mouth every 4 (four) hours as needed for cough or to loosen phlegm., Disp: 30 tablet, Rfl: 0  hydrocortisone 2.5 % cream, Apply topically 2 (two) times daily., Disp: 30 g, Rfl: 0   levocetirizine (XYZAL) 5 MG tablet, Take 1 tablet (5 mg total) by mouth every evening., Disp: 90 tablet, Rfl: 1   montelukast (SINGULAIR) 10 MG tablet, Take 1 tablet (10 mg total) by mouth daily., Disp: 90 tablet, Rfl: 2   Olopatadine HCl 0.2 % SOLN, Apply to eye., Disp: , Rfl:    rosuvastatin (CRESTOR) 20 MG tablet, TAKE 1 TABLET BY MOUTH DAILY, Disp: 90 tablet, Rfl: 2   Semaglutide,0.25 or 0.5MG /DOS, 2 MG/3ML SOPN, Inject 0.5 mg into the skin once a week., Disp: 6 mL, Rfl: 3   Turmeric (QC TUMERIC COMPLEX) 500 MG CAPS, Take by mouth., Disp: , Rfl:    valsartan (DIOVAN) 160 MG tablet, Take 1 tablet (160 mg total) by mouth daily., Disp: 90 tablet, Rfl: 3      Objective:   Vitals:   04/20/23 1059  BP: (!) 152/86  Pulse: 86  SpO2: 97%  Weight: 159 lb 9.6 oz (72.4 kg)  Height: 5' 4.5" (1.638 m)    Estimated body mass index is 26.97 kg/m as calculated from the following:   Height as of this encounter: 5' 4.5" (1.638 m).   Weight as of this encounter: 159 lb 9.6 oz (72.4 kg).  @WEIGHTCHANGE @  [employer]   04/20/23 1059  Weight: 159 lb 9.6  oz (72.4 kg)     Physical Exam   General: No distress. Looks well O2 at rest: no Cane present: no Sitting in wheel chair: no Frail: no Obese: no Neuro: Alert and Oriented x 3. GCS 15. Speech normal Psych: Pleasant Resp:  Barrel Chest - no.  Wheeze - no, Crackles - NO No overt respiratory distress CVS: Normal heart sounds. Murmurs - no Ext: Stigmata of Connective Tissue Disease - no HEENT: Normal upper airway. PEERL +. No post nasal drip        Assessment:       ICD-10-CM   1. ILD (interstitial lung disease) (HCC)  J84.9     2. IPF (idiopathic pulmonary fibrosis) (HCC)  J84.112     3. Seasonal allergies  J30.2     4. Elevated IgE level  R76.8     5. House dust mite allergy  Z91.09       -Giving a provisional diagnosis of idiopathic pulmonary fibrosis [IPF].  This is based on age greater than 17, disease predominantly in the lower lobe, probable UIP description on the CT scan, progression since 2020 [he had normal pulmonary function test in 2015], and near normal serology blood work  -Discussed that we would likely would not need a biopsy based on the above pretest probably.  She is scared of biopsy anemia   -Discussed prognosis is 1 of future progression with a lot of variability   - Discussed antifibrotic's indicated and discussed the following antifibrotic's   Both drugs OFEV and Esbriet only slow down progression, 1 out of 6 patients  - this means extension in quality of life but no difference in symptoms  - no study directly compares the 2 drugs but efficacy roughly equal at 1 year time point   - OFEV  - - time to first exacerbation possibly reduced in one trial but not in another - twice daily, no titration, potentially more convenient dosing  - no need for sunscreen  - high chance of mild diarrhea but low chance of significant diarrhea needing to stop medication.   - Rx  diarrhea with lomotil - slight increase in heart attack risk and theoretical  increase in bleeding risk,   - need monthly blood work for 3 months and then every 6 months - monitor liver function   - ESBRIET  - 3 pill three times daily, slow titration.  - Need to wean sunscreen  - Some chance of nausea and anorexia with small chance for diarrhea  - no known heart attack risk - no known bleeding risk,   - need monthly blood work for 6 months - monitor liver function - possible mortality benefit in pooled analysis  - larger world wide experience  Based on that she preferred pirfenidone/Esbriet      Plan:     Patient Instructions  ILD (interstitial lung disease) (HCC) IPF (idiopathic pulmonary fibrosis) (HCC)  -Giving a provisional diagnosis of idiopathic pulmonary fibrosis [IPF].  This is based on age greater than 45, disease predominantly in the lower lobe, probable UIP description on the CT scan, progression since 2020 [he had normal pulmonary function test in 2015], and near normal serology blood work  Plan  - Take ILD questionnaire and fill this up - Start pirfenidone per protocol  - Check LFT 04/20/2023  -Discussed this medicine in detail  -CMA to initiate paperwork -Clinical trial considerations in the future   Seasonal allergies Elevated IgE level House dust mite allergy   Plan   - refer allergist  Follow-up - 6-8-week visit with Dr. Marchelle Gearing or nurse practitioner 30-minute to see pirfenidone uptake   FOLLOWUP Return in about 7 weeks (around 06/08/2023) for 30 min visit, ILD, with Dr Marchelle Gearing, Face to Face OR Video Visit.    SIGNATURE    Dr. Kalman Shan, M.D., F.C.C.P,  Pulmonary and Critical Care Medicine Staff Physician, [facility] Center Director - Interstitial Lung Disease  Program  Pulmonary Fibrosis [facility] at [facility] [place], [place], [address]  Pager: [phone], If no answer or between  15:00h - 7:00h: call [phone] Telephone: [phone]  11:44 AM 04/20/2023

## 2023-04-21 ENCOUNTER — Other Ambulatory Visit (HOSPITAL_COMMUNITY): Payer: Self-pay

## 2023-04-21 ENCOUNTER — Telehealth: Payer: Self-pay | Admitting: Pharmacy Technician

## 2023-04-21 DIAGNOSIS — J84112 Idiopathic pulmonary fibrosis: Secondary | ICD-10-CM

## 2023-04-21 NOTE — Telephone Encounter (Signed)
Patient has dual-SNP plan so will not need grant for pirfenidone  Chesley Mires, PharmD, MPH, BCPS, CPP Clinical Pharmacist (Rheumatology and Pulmonology)

## 2023-04-21 NOTE — Telephone Encounter (Signed)
PA team fo Amy Escobar [phone] Anyone is PA team can assist our team

## 2023-04-21 NOTE — Telephone Encounter (Signed)
PA request has been Submitted. New Encounter created for follow up. For additional info see Pharmacy Prior Auth telephone encounter from 04/21/23.

## 2023-04-21 NOTE — Telephone Encounter (Signed)
Returned call and confirmed taper directions.  Prior Auth for patients medication Pirfenidone 267mg  approved by [employer] from 04/21/23 to 06/28/24. 180 tablets for 30 days.  ([employer]) PA Case ID #: [id number]  Ran test claim, patient copay is currently zero for 30 day supply. Can fill through [facility].   Applied for [employer] for patient- program is requesting proof of your Social Security number and household income by 05/21/2023.

## 2023-04-21 NOTE — Telephone Encounter (Signed)
Submitted a Prior Authorization request to [facility] for PIRFENIDONE via CoverMyMeds. Will update once we receive a response.  Key: [id number]

## 2023-04-22 ENCOUNTER — Ambulatory Visit: Payer: 59

## 2023-04-22 NOTE — Progress Notes (Signed)
Patient no longer sees our doctors she will call INS to see where to go  [place]

## 2023-04-26 ENCOUNTER — Ambulatory Visit (INDEPENDENT_AMBULATORY_CARE_PROVIDER_SITE_OTHER): Payer: 59 | Admitting: Sports Medicine

## 2023-04-26 ENCOUNTER — Other Ambulatory Visit: Payer: Self-pay | Admitting: Family

## 2023-04-26 VITALS — Ht 64.0 in | Wt 160.0 lb

## 2023-04-26 DIAGNOSIS — M25561 Pain in right knee: Secondary | ICD-10-CM

## 2023-04-26 DIAGNOSIS — G8929 Other chronic pain: Secondary | ICD-10-CM | POA: Diagnosis not present

## 2023-04-26 DIAGNOSIS — M170 Bilateral primary osteoarthritis of knee: Secondary | ICD-10-CM

## 2023-04-26 DIAGNOSIS — M25562 Pain in left knee: Secondary | ICD-10-CM

## 2023-04-26 DIAGNOSIS — R058 Other specified cough: Secondary | ICD-10-CM

## 2023-04-27 MED ORDER — PIRFENIDONE 267 MG PO TABS
ORAL_TABLET | ORAL | 0 refills | Status: DC
Start: 2023-04-27 — End: 2023-05-20
  Filled 2023-04-28: qty 207, 30d supply, fill #0

## 2023-04-27 MED ORDER — PIRFENIDONE 801 MG PO TABS
801.0000 mg | ORAL_TABLET | Freq: Three times a day (TID) | ORAL | 2 refills | Status: DC
  Filled 2023-04-28 – 2023-05-20 (×2): qty 90, 30d supply, fill #0

## 2023-04-27 NOTE — Telephone Encounter (Signed)
Called patient to advise of pirfenidone approval and cost. Rx sent to [facility]  Patient counseled on purpose, proper use, and potential adverse effects including nausea, vomiting, abdominal pain, GERD, weight loss, arthralgia, dizziness, and suns sensitivity/rash.  Stressed the importance of routine lab monitoring. Will monitor LFT's every month for the first 6 months of treatment then every 3 months.  Starting dose will be Esbriet 267 mg 1 tablet three times daily for 7 days, then 2 tablets three times daily for 7 days, then 3 tablets three times daily.  Maintenance dose will be 801 mg 1 tablet three times daily if tolerated.  Stressed the importance of taking with meals to minimize stomach upset.    Chesley Mires, PharmD, MPH, BCPS, CPP Clinical Pharmacist (Rheumatology and Pulmonology)

## 2023-04-28 ENCOUNTER — Other Ambulatory Visit: Payer: Self-pay

## 2023-04-28 NOTE — Progress Notes (Signed)
Specialty Pharmacy Initial Fill Coordination Note  Amy Escobar is a 77 y.o. female contacted today regarding refills of specialty medication(s) Pirfenidone   Patient requested Delivery   Delivery date: 05/04/23   Verified address: [address]   [place] [address]   Medication will be filled on 05/03/23.   Patient is aware of $0.00 copayment.   Please mail pt a [employer] as she does not like using MyChart, thank you!

## 2023-04-29 ENCOUNTER — Other Ambulatory Visit (HOSPITAL_COMMUNITY): Payer: Self-pay

## 2023-04-29 ENCOUNTER — Other Ambulatory Visit: Payer: Self-pay

## 2023-04-30 ENCOUNTER — Telehealth: Payer: Self-pay

## 2023-04-30 NOTE — Telephone Encounter (Addendum)
Called patient to get her scheduled for Bilateral knee Durolane injection.   No answer left a voicemail to call back   I accidentally said Monovisc on patients voicemail, but meant to say Durolane

## 2023-04-30 NOTE — Progress Notes (Signed)
Patient newly starting pirfenidone. Patient counseled by me over the phone on 04/27/23 regarding pirfneidone dosing and side effects.  Chesley Mires, PharmD, MPH, BCPS, CPP Clinical Pharmacist (Rheumatology and Pulmonology)

## 2023-05-03 ENCOUNTER — Other Ambulatory Visit: Payer: Self-pay

## 2023-05-05 ENCOUNTER — Ambulatory Visit (INDEPENDENT_AMBULATORY_CARE_PROVIDER_SITE_OTHER): Payer: 59 | Admitting: Allergy

## 2023-05-05 ENCOUNTER — Encounter: Payer: Self-pay | Admitting: Allergy

## 2023-05-05 ENCOUNTER — Other Ambulatory Visit: Payer: Self-pay

## 2023-05-05 VITALS — BP 108/60 | HR 89 | Temp 97.9°F | Resp 18 | Ht 62.5 in | Wt 153.4 lb

## 2023-05-05 DIAGNOSIS — H1013 Acute atopic conjunctivitis, bilateral: Secondary | ICD-10-CM

## 2023-05-05 DIAGNOSIS — J302 Other seasonal allergic rhinitis: Secondary | ICD-10-CM

## 2023-05-05 DIAGNOSIS — J849 Interstitial pulmonary disease, unspecified: Secondary | ICD-10-CM | POA: Diagnosis not present

## 2023-05-05 DIAGNOSIS — J3089 Other allergic rhinitis: Secondary | ICD-10-CM | POA: Diagnosis not present

## 2023-05-05 MED ORDER — OLOPATADINE HCL 0.2 % OP SOLN: 1.0000 [drp] | Freq: Every day | OPHTHALMIC | 5 refills | Status: DC | PRN

## 2023-05-05 MED ORDER — RYALTRIS 665-25 MCG/ACT NA SUSP: 2.0000 | Freq: Two times a day (BID) | NASAL | 5 refills | Status: DC

## 2023-05-05 NOTE — Progress Notes (Signed)
New Patient Note  RE: Amy Escobar MRN: [id number] DOB: 1946/02/27 Date of Office Visit: 05/05/2023  Primary care provider: Olive Bass, FNP  Chief Complaint: Allergies  History of present illness: Amy Escobar is a 77 y.o. female presenting today for evaluation of seasonal and perennial allergies with elevated IgE.  The patient, previously thought to have COPD which has been more recently diagnosed at [facility] and history of acid reflux, initially sought allergy care due to persistent coughing and reflux symptoms in 2020 where she saw Dr Nunzio Cobbs, who was part of this practice at that time.  She also has a history of allergies, with positive testing for dust mites, mold, dog dander, cockroach, tree pollen, grass pollen, and weed pollen.  She was prescribed montelukast a few years ago, which she continues to take. Despite taking montelukast, the patient continues to experience allergy symptoms, including sneezing, coughing, and itchy eyes. She also reports a significant amount of drainage from the nose, which she believes contributes to her cough. She uses Flonase for nasal congestion and Petaday eye drops for itchy, watery eyes.  She states getting her lung disease under control is her primary concern.  She is following with Dr. Marchelle Gearing and was recently started on Esbriet.  She will continue to follow with pulmonology for this issue.  Review of systems: 10pt ROS negative unless noted above in HPI  All other systems negative unless noted above in HPI  Past medical history: Past Medical History:  Diagnosis Date   Alkaline phosphatase deficiency    w/u Ne   Allergic rhinitis    Allergy    Anxiety    Arthritis    Cataract    BILATERAL-REMOVED   DM2 (diabetes mellitus, type 2) (HCC)    GERD (gastroesophageal reflux disease)    Gout    Hemorrhoids    Hyperlipidemia    Hypertension    Interstitial lung disease (HCC)    Mild pulmonary hypertension (HCC)    NSVT  (nonsustained ventricular tachycardia) (HCC)    Osteoporosis    PVC's (premature ventricular contractions)    Thyroid nodule    small   Tubular adenoma of colon 2017   Urticaria    Uterine fibroid     Past surgical history: Past Surgical History:  Procedure Laterality Date   CATARACT EXTRACTION Bilateral 2016   COLONOSCOPY  2007   echocardiogram (other)  01/16/2002   POLYPECTOMY     removed tumors from foot nerves  04/1999   stress cardiolite  02/12/2006   TOENAIL EXCISION     TUBAL LIGATION     TYMPANOSTOMY TUBE PLACEMENT      Family history:  Family History  Problem Relation Age of Onset   Heart disease Mother    Allergic rhinitis Mother    Heart disease Father    Stomach cancer Maternal Grandmother    Heart disease Maternal Grandfather    Rectal cancer Maternal Grandfather    Colon cancer Neg Hx    Breast cancer Neg Hx    Colon polyps Neg Hx    Esophageal cancer Neg Hx     Social history: Lives in an apartment with carpeting with electric heating and central cooling.  No pets in the home.  There is no concern for water damage, mildew or roaches in the home.  She is retired. Tobacco Use   Smoking status: Former    Current packs/day: 0.00    Average packs/day: 0.3 packs/day for 5.0 years (1.3 ttl  pk-yrs)    Types: Cigarettes    Start date: 06/30/1963    Quit date: 06/29/1968    Years since quitting: 54.8   Smokeless tobacco: Never  Vaping Use   Vaping status: Never Used    Medication List: Current Outpatient Medications  Medication Sig Dispense Refill   AMBULATORY NON FORMULARY MEDICATION Left wrist brace 1 Piece 0   aspirin EC 81 MG tablet Take 81 mg by mouth daily. Swallow whole.     benzonatate (TESSALON) 100 MG capsule Take 1 capsule (100 mg total) by mouth every 8 (eight) hours as needed for cough. 21 capsule 0   Blood Glucose Monitoring Suppl (ONETOUCH VERIO FLEX SYSTEM) w/Device KIT USE TO TEST BLOOD SUGAR DAILY AS DIRECTED 1 kit 0   calcium elemental  as carbonate (ANTACID MAXIMUM) 400 MG chewable tablet Chew 1,000 mg by mouth as needed for heartburn.     carvedilol (COREG) 12.5 MG tablet TAKE 1 TABLET BY MOUTH TWO TIMES A DAY WITH A MEAL 180 tablet 2   celecoxib (CELEBREX) 200 MG capsule TAKE 1 CAPSULE BY MOUTH DAILY 30 capsule 1   cholecalciferol (VITAMIN D3) 25 MCG (1000 UNIT) tablet Take 1,000 Units by mouth daily.     DULoxetine (CYMBALTA) 30 MG capsule Take 30 mg by mouth daily.     esomeprazole (NEXIUM) 40 MG capsule Take 1 capsule (40 mg total) by mouth 2 (two) times daily before a meal. 180 capsule 0   famotidine (PEPCID) 20 MG tablet Take 1 tablet (20 mg total) by mouth at bedtime. 90 tablet 2   fluticasone (FLONASE) 50 MCG/ACT nasal spray Place 1 spray into both nostrils daily. Begin by using 2 sprays in each nare daily for 3 to 5 days, then decrease to 1 spray in each nare daily. 15.8 mL 2   fluticasone-salmeterol (ADVAIR HFA) 115-21 MCG/ACT inhaler Inhale 2 puffs into the lungs 2 (two) times daily. 1 each 12   gabapentin (NEURONTIN) 100 MG capsule Take 1 capsule (100 mg total) by mouth at bedtime. 90 capsule 3   glucose blood (ONETOUCH VERIO) test strip USE TO CHECK BLOOD SUGAE TWO TIMES A DAY 100 strip 3   hydrocortisone 2.5 % cream Apply topically 2 (two) times daily. 30 g 0   levocetirizine (XYZAL) 5 MG tablet Take 1 tablet (5 mg total) by mouth every evening. 90 tablet 1   montelukast (SINGULAIR) 10 MG tablet Take 1 tablet (10 mg total) by mouth daily. 90 tablet 2   Olopatadine HCl 0.2 % SOLN Apply to eye.     rosuvastatin (CRESTOR) 20 MG tablet TAKE 1 TABLET BY MOUTH DAILY 90 tablet 2   Semaglutide,0.25 or 0.5MG /DOS, 2 MG/3ML SOPN Inject 0.5 mg into the skin once a week. 6 mL 3   Turmeric (QC TUMERIC COMPLEX) 500 MG CAPS Take by mouth.     valsartan (DIOVAN) 160 MG tablet Take 1 tablet (160 mg total) by mouth daily. 90 tablet 3   alendronate (FOSAMAX) 70 MG tablet Take 70 mg by mouth once a week.      Chlorpheniramine-Acetaminophen (CORICIDIN HBP COLD/FLU PO) Take by mouth 2 (two) times daily. (Patient not taking: Reported on 05/05/2023)     guaiFENesin 200 MG tablet Take 1 tablet (200 mg total) by mouth every 4 (four) hours as needed for cough or to loosen phlegm. (Patient not taking: Reported on 05/05/2023) 30 tablet 0   Pirfenidone (ESBRIET) 801 MG TABS Take 1 tablet (801 mg total) by mouth 3 (three) times  daily with meals. 90 tablet 2   Pirfenidone 267 MG TABS Take 1 tab three times daily for 7 days, then 2 tabs three times daily for 7 days, then 3 tabs three times daily thereafter. 270 tablet 0   No current facility-administered medications for this visit.    Known medication allergies: Allergies  Allergen Reactions   Olmesartan Medoxomil     REACTION: headache     Physical examination: Blood pressure 108/60, pulse 89, temperature 97.9 F (36.6 C), temperature source Temporal, resp. rate 18, height 5' 2.5" (1.588 m), weight 153 lb 6.4 oz (69.6 kg), SpO2 97%.  General: Alert, interactive, in no acute distress. HEENT: PERRLA, TMs pearly gray, turbinates mildly edematous without discharge, post-pharynx non erythematous. Neck: Supple without lymphadenopathy. Lungs: Clear to auscultation without wheezing, rhonchi or rales. {no increased work of breathing. CV: Normal S1, S2 without murmurs. Abdomen: Nondistended, nontender. Skin: Warm and dry, without lesions or rashes. Extremities:  No clubbing, cyanosis or edema. Neuro:   Grossly intact.  Diagnositics/Labs: Labs:  Component     Latest Ref Rng 01/21/2023  IgE (Immunoglobulin E), Serum     6 - 495 IU/mL 1,646 (H)     Component     Latest Ref Rng 08/22/2018  Allergen, D pternoyssinus,d7     kU/L 1.00 (H)   Class 1   Class 2   Class 1   Class 0   Class 1   Class 1   Class 2   Class 2   Class 1   Class 1   Class 0   Class 2   Class 2   Class 0   Class 0   Class 1   Class 0   Class 0   Class 0   Class 1   Class  2   Class 1   Class 1   Class 2   D. farinae     kU/L 1.04 (H)   Allergen, P. notatum, m1     kU/L 0.16 (H)   CLADOSPORIUM HERBARUM (M2) IGE     kU/L 0.34 (H)   Aspergillus fumigatus, m3     kU/L 0.41 (H)   Allergen, A. alternata, m6     kU/L <0.10   Cat Dander     kU/L <0.10   Dog Dander     kU/L 0.31 (H)   Cockroach     kU/L 0.55 (H)   Box Elder IgE     kU/L 0.78 (H)   Allergen, Comm Silver Charletta Cousin, t9     kU/L 0.51 (H)   Allergen, Cedar tree, t12     kU/L 0.68 (H)   Allergen, Cottonwood, t14     kU/L 0.80 (H)   Allergen, Oak,t7     kU/L 0.43 (H)   Elm IgE     kU/L 1.04 (H)   Pecan/Hickory Tree IgE     kU/L 0.55 (H)   Allergen, Mulberry, t76     kU/L 0.19 (H)   [place] Grass     kU/L 0.57 (H)   Timothy Grass     kU/L 0.46 (H)   Johnson Grass     kU/L 0.85 (H)   COMMON RAGWEED (SHORT) (W1) IGE     kU/L 1.09 (H)   Rough Pigweed  IgE     kU/L 0.53 (H)   Sheep Sorrel IgE     kU/L 0.53 (H)   Allergen, Mouse Urine Protein, e78     kU/L <0.10   IgE (Immunoglobulin  E), Serum     <OR=114 kU/L 2,095 (H)      Assessment and plan: Allergic Rhinitis with Conjunctivitis Positive environmental allergy testing in 2020.  Continue avoidance measures for dust mites, molds, cockroach, dog dander, tree pollen, grass pollen, weed pollen.   Current symptoms include sneezing, coughing, and postnasal drip.  -Continue Montelukast 10 mg daily at bedtime.  This is an antifungal tried. -Add Xyzal 5 mg daily.  This is an long-acting antihistamine. -Replace Flonase with Ryaltris nasal spray.  Ryaltris is a combination spray with steroid component for congestion control and antihistamine component for drainage. Ryaltris 2 sprays each nostril twice a day as needed for runny or stuffy nose -Continue Pataday eye drops 1 drop each eye daily as needed for itchy watery eyes.  - Consider allergy shots as a means of long-term control if medication management is not effective.  Would recommend  repeat testing if proceeding with allergy shots. - Allergy shots "re-train" and "reset" the immune system to ignore environmental allergens and decrease the resulting immune response to those allergens (sneezing, itchy watery eyes, runny nose, nasal congestion, etc).    - Allergy shots improve symptoms in 75-85% of patients.  - We can discuss more at a future appointment if the medications are not working for you.   Interstitial Lung Disease Under the care of Dr. Monica Becton. Recently started on Pirfenidone (Esbriet). -Continue follow-up with Dr. Monica Becton for management of interstitial lung disease.  Follow-up in 3-4 months to assess the effectiveness of the new allergy regimen.  I appreciate the opportunity to take part in [facility] care. Please do not hesitate to contact me with questions.  Sincerely,   Margo Aye, MD Allergy/Immunology [facility]

## 2023-05-05 NOTE — Patient Instructions (Addendum)
Allergic Rhinitis with Conjunctivitis Positive environmental allergy testing in 2020.  Continue avoidance measures for dust mites, molds, cockroach, dog dander, tree pollen, grass pollen, weed pollen.   Current symptoms include sneezing, coughing, and postnasal drip.  -Continue Montelukast 10 mg daily at bedtime.  This is an antifungal tried. -Add Xyzal 5 mg daily.  This is an long-acting antihistamine. -Replace Flonase with Ryaltris nasal spray.  Ryaltris is a combination spray with steroid component for congestion control and antihistamine component for drainage. Ryaltris 2 sprays each nostril twice a day as needed for runny or stuffy nose -Continue Pataday eye drops 1 drop each eye daily as needed for itchy watery eyes.  - Consider allergy shots as a means of long-term control if medication management is not effective.  Would recommend repeat testing if proceeding with allergy shots. - Allergy shots "re-train" and "reset" the immune system to ignore environmental allergens and decrease the resulting immune response to those allergens (sneezing, itchy watery eyes, runny nose, nasal congestion, etc).    - Allergy shots improve symptoms in 75-85% of patients.  - We can discuss more at a future appointment if the medications are not working for you.   Interstitial Lung Disease Under the care of Dr. Monica Becton. Recently started on Pirfenidone (Esbriet). -Continue follow-up with Dr. Monica Becton for management of interstitial lung disease.  Follow-up in 3-4 months to assess the effectiveness of the new allergy regimen.

## 2023-05-16 NOTE — Progress Notes (Unsigned)
Cardiology Office Note:  .   Date:  05/17/2023  ID:  Latesa, Kurth 08/30/1945, MRN [id number] PCP: Olive Bass, FNP  [facility] Providers Cardiologist:  Meriam Sprague, MD (Inactive)    History of Present Illness: Marland Kitchen   Amy Escobar is a 77 y.o. female with hx of HTN, HLD, IDDM, moderate PAH, nonobstructive coronary artery disease, palpitations.   Prior CTA 2018 calcium score 220 placing her in the 81st percentile for age/sex matched control with moderate plaque in ostial LM and mild plaque in ostial RCA. Echo 02/2019 LVEF 55-60%, RV normal. NST 02/2022 no ischemia, LVEF 44%.   Last seen 11/18/22 doing well.   Discussed the use of AI scribe software for clinical note transcription with the patient, who gave verbal consent to proceed.     Pleasant lady presents today for follow up independently. Reports feeling well since her last visit seven months ago. She reports no significant cardiac symptoms. Reviewed secondary prevention for coronary disease including aspirin, good blood pressure control, and cholesterol control with rosuvastatin. Her LDL 12/2022 was 58 and liver enzymes are normal. She also has a pulmonary disease (ILD) and is on Advair following with Dr. Marchelle Gearing.  No chest pain, dyspnea, edema, orthopnea, PND. No formal exercise routine. Endorses following heart healthy diet.       ROS: Please see the history of present illness.    All other systems reviewed and are negative.   Studies Reviewed: Marland Kitchen   EKG Interpretation Date/Time:  Monday May 17 2023 09:03:49 EST Ventricular Rate:  73 PR Interval:  166 QRS Duration:  94 QT Interval:  406 QTC Calculation: 447 R Axis:   -7  Text Interpretation: Normal sinus rhythm Minimal voltage criteria for LVH, may be normal variant ( R in aVL ) No acute changes Confirmed by Gillian Shields ([id number]) on 05/17/2023 9:21:37 AM    Cardiac Studies & Procedures     STRESS TESTS  MYOCARDIAL PERFUSION  IMAGING 03/20/2022  Narrative   Findings are consistent with no prior ischemia and no prior myocardial infarction. The study is intermediate risk based on ejection fraction.   No ST deviation was noted.   LV perfusion is normal. There is no evidence of ischemia. There is no evidence of infarction.   Left ventricular function is abnormal. Global function is moderately reduced. There were no regional wall motion abnormalities. Nuclear stress EF: 44 %. The left ventricular ejection fraction is moderately decreased (30-44%). End diastolic cavity size is mildly enlarged. End systolic cavity size is mildly enlarged.   Prior study available for comparison from 03/10/2019.  Normal perfusion global hypokinesis worse in the apex estimated EF 44%   ECHOCARDIOGRAM  ECHOCARDIOGRAM COMPLETE 03/20/2022  Narrative ECHOCARDIOGRAM REPORT    Patient Name:   Amy Escobar Date of Exam: 03/20/2022 Medical Rec #:  [id number]        Height:       64.0 in Accession #:    [id number]       Weight:       163.0 lb Date of Birth:  [birth year]        BSA:          1.793 m Patient Age:    76 years         BP:           114/72 mmHg Patient Gender: F                HR:  67 bpm. Exam Location:  [address]  Procedure: 2D Echo, 3D Echo, Cardiac Doppler, Color Doppler and Strain Analysis  Indications:    R06.02 Shortness of breath  History:        Patient has prior history of Echocardiogram examinations, most recent 03/10/2019. Pulmonary HTN, Arrythmias:SVT and PVC, Signs/Symptoms:Shortness of Breath; Risk Factors:Hypertension, Diabetes, Dyslipidemia and Former Smoker. Morbid obesity.  Sonographer:    Jorje Guild BS, RDCS Referring Phys: [phone] Devoria Albe DICK  IMPRESSIONS   1. Left ventricular ejection fraction, by estimation, is 55 to 60%. The left ventricle has normal function. The left ventricle has no regional wall motion abnormalities. Left ventricular diastolic parameters are indeterminate. The  average left ventricular global longitudinal strain is -20.7 %. The global longitudinal strain is normal. 2. Right ventricular systolic function is normal. The right ventricular size is normal. There is normal pulmonary artery systolic pressure. The estimated right ventricular systolic pressure is 30.7 mmHg. 3. The mitral valve is normal in structure. Mild mitral valve regurgitation. No evidence of mitral stenosis. 4. The aortic valve is tricuspid. Aortic valve regurgitation is not visualized. No aortic stenosis is present. 5. The inferior vena cava is normal in size with greater than 50% respiratory variability, suggesting right atrial pressure of 3 mmHg.  Comparison(s): No significant change from prior study. 03/10/19 EF 55-60%. PA pressure .  Conclusion(s)/Recommendation(s): Otherwise normal echocardiogram, with minor abnormalities described in the report.  FINDINGS Left Ventricle: Left ventricular ejection fraction, by estimation, is 55 to 60%. The left ventricle has normal function. The left ventricle has no regional wall motion abnormalities. The average left ventricular global longitudinal strain is -20.7 %. The global longitudinal strain is normal. The left ventricular internal cavity size was normal in size. There is no left ventricular hypertrophy. Left ventricular diastolic parameters are indeterminate.  Right Ventricle: The right ventricular size is normal. No increase in right ventricular wall thickness. Right ventricular systolic function is normal. There is normal pulmonary artery systolic pressure. The tricuspid regurgitant velocity is 2.63 m/s, and with an assumed right atrial pressure of 3 mmHg, the estimated right ventricular systolic pressure is 30.7 mmHg.  Left Atrium: Left atrial size was normal in size.  Right Atrium: Right atrial size was normal in size.  Pericardium: There is no evidence of pericardial effusion.  Mitral Valve: The mitral valve is normal in  structure. Mild mitral annular calcification. Mild mitral valve regurgitation. No evidence of mitral valve stenosis.  Tricuspid Valve: The tricuspid valve is normal in structure. Tricuspid valve regurgitation is mild . No evidence of tricuspid stenosis.  Aortic Valve: The aortic valve is tricuspid. Aortic valve regurgitation is not visualized. No aortic stenosis is present.  Pulmonic Valve: The pulmonic valve was grossly normal. Pulmonic valve regurgitation is not visualized. No evidence of pulmonic stenosis.  Aorta: The aortic root, ascending aorta, aortic arch and descending aorta are all structurally normal, with no evidence of dilitation or obstruction.  Venous: The inferior vena cava is normal in size with greater than 50% respiratory variability, suggesting right atrial pressure of 3 mmHg.  IAS/Shunts: The atrial septum is grossly normal.   LEFT VENTRICLE PLAX 2D LVIDd:         4.70 cm   Diastology LVIDs:         3.60 cm   LV e' medial:    3.98 cm/s LV PW:         1.00 cm   LV E/e' medial:  9.6 LV IVS:  1.00 cm   LV e' lateral:   4.88 cm/s LVOT diam:     2.30 cm   LV E/e' lateral: 7.9 LV SV:         64 LV SV Index:   36        2D Longitudinal Strain LVOT Area:     4.15 cm  2D Strain GLS (A2C):   -19.6 % 2D Strain GLS (A3C):   -19.9 % 2D Strain GLS (A4C):   -22.4 % 2D Strain GLS Avg:     -20.7 %  3D Volume EF: 3D EF:        57 % LV EDV:       101 ml LV ESV:       44 ml LV SV:        57 ml  RIGHT VENTRICLE RV Basal diam:  3.10 cm RV S prime:     10.10 cm/s TAPSE (M-mode): 2.0 cm RVSP:           30.7 mmHg  LEFT ATRIUM             Index        RIGHT ATRIUM           Index LA diam:        4.00 cm 2.23 cm/m   RA Pressure: 3.00 mmHg LA Vol (A2C):   28.7 ml 16.00 ml/m  RA Area:     13.00 cm LA Vol (A4C):   23.7 ml 13.21 ml/m  RA Volume:   32.00 ml  17.84 ml/m LA Biplane Vol: 28.8 ml 16.06 ml/m AORTIC VALVE LVOT Vmax:   64.40 cm/s LVOT Vmean:  45.000  cm/s LVOT VTI:    0.155 m  AORTA Ao Root diam: 3.20 cm Ao Asc diam:  3.60 cm  MITRAL VALVE               TRICUSPID VALVE TR Peak grad:   27.7 mmHg MV Decel Time: 229 msec    TR Vmax:        263.00 cm/s MV E velocity: 38.40 cm/s  Estimated RAP:  3.00 mmHg MV A velocity: 81.40 cm/s  RVSP:           30.7 mmHg MV E/A ratio:  0.47 SHUNTS Systemic VTI:  0.16 m Systemic Diam: 2.30 cm  Jodelle Red MD Electronically signed by Jodelle Red MD Signature Date/Time: 03/20/2022/2:47:48 PM    Final     CT SCANS  CT CORONARY MORPH W/CTA COR W/SCORE 11/18/2016  Addendum 11/20/2016  7:52 AM ADDENDUM REPORT: 11/20/2016 07:50  EXAM: FF/RCT ANALYSIS  FINDINGS: FFRct analysis was performed on the original cardiac CT angiogram dataset. Diagrammatic representation of the FFRct analysis is provided in a separate PDF document in PACS. This dictation was created using the PDF document and an interactive 3D model of the results. 3D model is not available in the EMR/PACS. Normal FFR range is >0.80.  1. Left Main:  No significant stenosis. 2. LAD: No significant stenosis. 3. LCX: No significant stenosis. 4. RCA: No significant stenosis.  IMPRESSION: No hemodynamically significant stenosis was identified by CT FFR.   Electronically Signed By: Tobias Alexander On: 11/20/2016 07:50  Addendum 11/19/2016  8:19 AM ADDENDUM REPORT: 11/19/2016 08:17  CLINICAL DATA:  77 year old female with chest pain.  EXAM: Cardiac/Coronary  CT  TECHNIQUE: The patient was scanned on a Philips 256 scanner.  FINDINGS: A 120 kV prospective scan was triggered in the descending thoracic aorta at 111 HU's. Axial  non-contrast 3 mm slices were carried out through the heart. The data set was analyzed on a dedicated work station and scored using the Agatson method. Gantry rotation speed was 270 msecs and collimation was .9 mm. 5 mg of iv Metoprolol and 0.8 mg of sl NTG was given. The 3D data  set was reconstructed in 5% intervals of the 67-82 % of the R-R cycle. Diastolic phases were analyzed on a dedicated work station using MPR, MIP and VRT modes. The patient received 80 cc of contrast.  Aorta: Normal size, mild diffuse calcifications at the aortic root, no dissection.  Aortic Valve:  Trileaflet.  No calcifications.  Coronary Arteries:  Normal coronary origin.  Right dominance.  Right coronary cusp has a large calcified plaque that partially extends into the RCA ostium contributing to 25-50% stenosis. Remaining RCA has minimal nonobstructive plaque.  Left main is a large caliber and long vessel that gives rise to LAD and LCX arteries. Left coronary cusp contains a large calcified plaque that encroaches LM ostium creating 50-69% stenosis. The remaining LM has no significant plaque.  LAD is a large and long vessel that wraps around the apex. There is a mild mixed plaque in the proximal LAD associated with 25-50% stenosis.  LCX artery is a medium caliber non-dominant vessel that has no significant plaque.  Other findings:  Normal pulmonary vein drainage into the left atrium.  Normal left atrial appendage with no evidence of a thrombus.  No ASD/VSD.  Normal size of the pulmonary artery.  IMPRESSION: 1. Coronary calcium score of 220. This was 14 percentile for age and sex matched control.  2. Normal coronary origin.  Right dominance.  3. Moderate plaque in the ostial LM and mild plaque in the ostial RCA, the study will be sent out for an additional analysis with CT FFR.  Tobias Alexander   Electronically Signed By: Tobias Alexander On: 11/19/2016 08:17  Narrative EXAM: OVER-READ INTERPRETATION  CT CHEST  The following report is an over-read performed by radiologist Dr. Royal Piedra [facility] on 11/18/2016. This over-read does not include interpretation of cardiac or coronary anatomy or pathology. The coronary calcium  score/coronary CTA interpretation by the cardiologist is attached.  COMPARISON:  None.  FINDINGS: Aortic atherosclerosis. By within the visualized portions of the thorax there are no suspicious appearing pulmonary nodules or masses, there is no acute consolidative airspace disease, no pleural effusions, no pneumothorax and no lymphadenopathy. Visualized portions of the upper abdomen demonstrate mild diffuse low attenuation throughout the visualized hepatic parenchyma, indicative of hepatic steatosis. There are no aggressive appearing lytic or blastic lesions noted in the visualized portions of the skeleton.  IMPRESSION: 1. Aortic atherosclerosis. 2. Mild hepatic steatosis.  Electronically Signed: By: Trudie Reed M.D. On: 11/18/2016 15:35          Risk Assessment/Calculations:             Physical Exam:   VS:  BP 120/68   Pulse 76   Ht 5' 2.5" (1.588 m)   Wt 157 lb 1.6 oz (71.3 kg)   SpO2 96%   BMI 28.28 kg/m    Wt Readings from Last 3 Encounters:  05/17/23 157 lb 1.6 oz (71.3 kg)  05/05/23 153 lb 6.4 oz (69.6 kg)  04/26/23 160 lb (72.6 kg)    GEN: Well nourished, well developed in no acute distress NECK: No JVD; No carotid bruits CARDIAC: RRR, no murmurs, rubs, gallops RESPIRATORY:  Clear to auscultation without rales, wheezing  or rhonchi  ABDOMEN: Soft, non-tender, non-distended EXTREMITIES:  No edema; No deformity   ASSESSMENT AND PLAN: .        Nonobstructive CAD Stable with no new symptoms. EKG normal. LDL controlled at 58 on Rosuvastatin. -Continue Aspirin, Rosuvastatin, Carvedilol. -Recheck cholesterol annually. -Recommend aiming for 150 minutes of moderate intensity activity per week and following a heart healthy diet.    Pulmonary Hypertension / ILD No new symptoms. Patient is proactive in managing environmental factors. -Continue Advair. -management per pulmonology   HTN BP controlled. continue present antihypertensive regimen. -Refer to  PREP exercise program.  Follow-up in 6 months. (Plans to establish with Dr. Duke Salvia after Dr. Devin Going departure)            Signed, Alver Sorrow, NP

## 2023-05-17 ENCOUNTER — Ambulatory Visit: Payer: 59 | Admitting: Nurse Practitioner

## 2023-05-17 ENCOUNTER — Encounter (HOSPITAL_BASED_OUTPATIENT_CLINIC_OR_DEPARTMENT_OTHER): Payer: Self-pay | Admitting: Family

## 2023-05-17 ENCOUNTER — Ambulatory Visit (HOSPITAL_BASED_OUTPATIENT_CLINIC_OR_DEPARTMENT_OTHER): Payer: 59 | Admitting: Family

## 2023-05-17 VITALS — BP 120/68 | HR 76 | Ht 62.5 in | Wt 157.1 lb

## 2023-05-17 DIAGNOSIS — E785 Hyperlipidemia, unspecified: Secondary | ICD-10-CM

## 2023-05-17 DIAGNOSIS — I25118 Atherosclerotic heart disease of native coronary artery with other forms of angina pectoris: Secondary | ICD-10-CM | POA: Diagnosis not present

## 2023-05-17 DIAGNOSIS — I10 Essential (primary) hypertension: Secondary | ICD-10-CM

## 2023-05-17 NOTE — Patient Instructions (Addendum)
Medication Instructions:  Your physician recommends that you continue on your current medications as directed. Please refer to the Current Medication list given to you today.  *If you need a refill on your cardiac medications before your next appointment, please call your pharmacy*  Follow-Up: At [facility], you and your health needs are our priority.  As part of our continuing mission to provide you with exceptional heart care, we have created designated Provider Care Teams.  These Care Teams include your primary Cardiologist (physician) and Advanced Practice Providers (APPs -  Physician Assistants and Nurse Practitioners) who all work together to provide you with the care you need, when you need it.  We recommend signing up for the patient portal called "MyChart".  Sign up information is provided on this After Visit Summary.  MyChart is used to connect with patients for Virtual Visits (Telemedicine).  Patients are able to view lab/test results, encounter notes, upcoming appointments, etc.  Non-urgent messages can be sent to your provider as well.   To learn more about what you can do with MyChart, go to [email or web address].    Your next appointment:   6 month(s)  Provider:   Chilton Si, MD or Gillian Shields, NP    Other Instructions  For coronary artery disease often called "heart disease" we aim for optimal guideline directed medical therapy. We use the "A, B, C"s to help keep [place] on track!  A = Aspirin 81mg  daily B = Blood pressure control. C = Cholesterol control. You take Rosuvastatin to help control your cholesterol.  D = Diet aiming to follow a low sodium, heart healthy diet. E = Exercise aiming to gradually increase to 150 minutes of activity per week.   We have sent a referral in for the PREP program in [facility] for you. They will call you soon (within 2 weeks) to reach out and get you started!

## 2023-05-19 ENCOUNTER — Telehealth: Payer: Self-pay | Admitting: *Deleted

## 2023-05-19 NOTE — Telephone Encounter (Signed)
Contacted regarding PREP Class referral. Left voice message to return my call for more information.

## 2023-05-20 ENCOUNTER — Other Ambulatory Visit: Payer: Self-pay

## 2023-05-20 ENCOUNTER — Other Ambulatory Visit (HOSPITAL_COMMUNITY): Payer: Self-pay

## 2023-05-20 ENCOUNTER — Telehealth: Payer: Self-pay | Admitting: Internal Medicine

## 2023-05-20 NOTE — Telephone Encounter (Signed)
[facility] calling.This PT's Advair. will not be covered next year. Here are the alternatives. She is good until the end of the year. Please advise which med PT should replace the Advair with.   Wixela Symbicort

## 2023-05-20 NOTE — Progress Notes (Signed)
Specialty Pharmacy Refill Coordination Note  Amy Escobar is a 77 y.o. female contacted today regarding refills of specialty medication(s) Pirfenidone   Patient requested Delivery   Delivery date: 05/31/23   Verified address: [address]   [place] [address]   Medication will be filled on 05/28/23.

## 2023-05-23 ENCOUNTER — Other Ambulatory Visit: Payer: Self-pay | Admitting: Physician Assistant

## 2023-05-24 NOTE — Progress Notes (Unsigned)
Aleen Sells D.[facility] [address] [place] [address] Phone: [phone]   Assessment and Plan:     There are no diagnoses linked to this encounter.  ***   Pertinent previous records reviewed include ***    Follow Up: ***     Subjective:   I, Mariana Wiederholt, am serving as a Neurosurgeon for Doctor Richardean Sale   Chief Complaint: bilateral knee injection    HPI:  09/30/2021 Patient given injection today and tolerated the procedure well, discussed icing regimen and home exercise, discussed which activities to do which wants to avoid.  Discussed which activities to potentially avoid.  Patient does not want to do the home exercises on a regular basis at the moment.  Follow-up with me again 6 to 8 weeks.  Can repeat every 3 months if needed   Chronic, with exacerbation.  Has had this difficulty for some time.  Discussed the possibility of a lumbar radiculopathy with radicular symptoms.  Discussed which activities to do which ones to avoid.  Follow-up again in 3 months   Update 12/09/2021 Carmyn Spoonamore is a 77 y.o. female coming in with complaint of B hip and B knee pain. Saw Dr. Jean Rosenthal twice in May 2023 for shoulder, elbow, and wrist pain after a fall. Worsening pain in the knees  Patient states still sore from fall. Time for injections in knees and wondering about GT injections because she has to see a kidney doctor.     Patient when seen another provider was found to have a possible lung nodule.  Was sent for a CT chest with contrast that did not show any significant nodule noted on CT scan but was found to have significant right hydronephrosis.  And then was sent for CT abdomen pelvis which did show that there was a significant right-sided hydronephrosis with possible stricture and no sign of true stone formation.   Patient also GFR has been decreased with most recent labs of 59.8   02/27/2022 Patient states wants knee and injection and  hip injections   03/11/2022 Karilyn Cruser Nykaza is a 77 y.o. female coming in with complaint of B knee pain. Injected by Dr. Jean Rosenthal on 02/27/2022. Durolane approved. Patient states that she is would like injections in backside and gel injections.    04/14/2022 Patient states she is in a lot of pain  , her left knee goes out and her right knee hurts a lot    04/22/2022 Patient states still in pain , left is worse than right    04/26/2023 Patient states here for injection wants to discuss where to put them , has been working at the furniture market   05/26/2023   Relevant Historical Information: DM type II, hypertension Additional pertinent review of systems negative.   Current Outpatient Medications:    alendronate (FOSAMAX) 70 MG tablet, Take 70 mg by mouth once a week., Disp: , Rfl:    AMBULATORY NON FORMULARY MEDICATION, Left wrist brace, Disp: 1 Piece, Rfl: 0   aspirin EC 81 MG tablet, Take 81 mg by mouth daily. Swallow whole., Disp: , Rfl:    Blood Glucose Monitoring Suppl (ONETOUCH VERIO FLEX SYSTEM) w/Device KIT, USE TO TEST BLOOD SUGAR DAILY AS DIRECTED, Disp: 1 kit, Rfl: 0   calcium elemental as carbonate (ANTACID MAXIMUM) 400 MG chewable tablet, Chew 1,000 mg by mouth as needed for heartburn., Disp: , Rfl:    carvedilol (COREG) 12.5 MG tablet, TAKE 1 TABLET  BY MOUTH TWO TIMES A DAY WITH A MEAL, Disp: 180 tablet, Rfl: 2   celecoxib (CELEBREX) 200 MG capsule, TAKE 1 CAPSULE BY MOUTH DAILY, Disp: 30 capsule, Rfl: 1   cholecalciferol (VITAMIN D3) 25 MCG (1000 UNIT) tablet, Take 1,000 Units by mouth daily., Disp: , Rfl:    DULoxetine (CYMBALTA) 30 MG capsule, Take 30 mg by mouth daily., Disp: , Rfl:    esomeprazole (NEXIUM) 40 MG capsule, Take 1 capsule (40 mg total) by mouth 2 (two) times daily before a meal., Disp: 180 capsule, Rfl: 0   famotidine (PEPCID) 20 MG tablet, Take 1 tablet (20 mg total) by mouth at bedtime., Disp: 90 tablet, Rfl: 2   fluticasone (FLONASE) 50 MCG/ACT nasal  spray, Place 1 spray into both nostrils daily. Begin by using 2 sprays in each nare daily for 3 to 5 days, then decrease to 1 spray in each nare daily., Disp: 15.8 mL, Rfl: 2   fluticasone-salmeterol (ADVAIR HFA) 115-21 MCG/ACT inhaler, Inhale 2 puffs into the lungs 2 (two) times daily., Disp: 1 each, Rfl: 12   gabapentin (NEURONTIN) 100 MG capsule, Take 1 capsule (100 mg total) by mouth at bedtime., Disp: 90 capsule, Rfl: 3   glucose blood (ONETOUCH VERIO) test strip, USE TO CHECK BLOOD SUGAE TWO TIMES A DAY, Disp: 100 strip, Rfl: 3   hydrocortisone 2.5 % cream, Apply topically 2 (two) times daily., Disp: 30 g, Rfl: 0   levocetirizine (XYZAL) 5 MG tablet, Take 1 tablet (5 mg total) by mouth every evening., Disp: 90 tablet, Rfl: 1   montelukast (SINGULAIR) 10 MG tablet, Take 1 tablet (10 mg total) by mouth daily., Disp: 90 tablet, Rfl: 2   Olopatadine HCl 0.2 % SOLN, Apply 1 drop to eye daily as needed., Disp: 2.5 mL, Rfl: 5   Olopatadine-Mometasone (RYALTRIS) 665-25 MCG/ACT SUSP, Place 2 sprays into the nose in the morning and at bedtime., Disp: 29 g, Rfl: 5   Pirfenidone (ESBRIET) 801 MG TABS, Take 1 tablet (801 mg total) by mouth 3 (three) times daily with meals., Disp: 90 tablet, Rfl: 2   rosuvastatin (CRESTOR) 20 MG tablet, TAKE 1 TABLET BY MOUTH DAILY, Disp: 90 tablet, Rfl: 2   Semaglutide,0.25 or 0.5MG /DOS, 2 MG/3ML SOPN, Inject 0.5 mg into the skin once a week., Disp: 6 mL, Rfl: 3   Turmeric (QC TUMERIC COMPLEX) 500 MG CAPS, Take by mouth., Disp: , Rfl:    valsartan (DIOVAN) 160 MG tablet, Take 1 tablet (160 mg total) by mouth daily., Disp: 90 tablet, Rfl: 3   Objective:     There were no vitals filed for this visit.    There is no height or weight on file to calculate BMI.    Physical Exam:    ***   Electronically signed by:  Aleen Sells D.[facility] 4:03 PM 05/24/23

## 2023-05-24 NOTE — Telephone Encounter (Signed)
Dr. Marchelle Gearing, Patient's Advair will not be covered next year per insurance company.  Please see 2 covered inhalers.  One of these will need to be ordered as of January 1st 2025.  Thank you.

## 2023-05-25 NOTE — Telephone Encounter (Signed)
Go with Symbicort 160/4.52 puff 2 times daily

## 2023-05-26 ENCOUNTER — Ambulatory Visit: Payer: 59 | Admitting: Sports Medicine

## 2023-05-26 VITALS — Ht 62.0 in

## 2023-05-26 DIAGNOSIS — M25562 Pain in left knee: Secondary | ICD-10-CM

## 2023-05-26 DIAGNOSIS — M25561 Pain in right knee: Secondary | ICD-10-CM

## 2023-05-26 DIAGNOSIS — G8929 Other chronic pain: Secondary | ICD-10-CM

## 2023-05-26 DIAGNOSIS — M170 Bilateral primary osteoarthritis of knee: Secondary | ICD-10-CM | POA: Diagnosis not present

## 2023-05-26 MED ORDER — BUDESONIDE-FORMOTEROL FUMARATE 160-4.5 MCG/ACT IN AERO: 2.0000 | INHALATION_SPRAY | Freq: Two times a day (BID) | RESPIRATORY_TRACT | 2 refills | Status: DC

## 2023-05-26 MED ORDER — SODIUM HYALURONATE 60 MG/3ML IX PRSY
120.0000 mg | PREFILLED_SYRINGE | Freq: Once | INTRA_ARTICULAR | Status: AC
  Administered 2023-05-26: 120 mg via INTRA_ARTICULAR

## 2023-05-26 NOTE — Telephone Encounter (Signed)
Spoke with patient and advised of change in medication. She states she has enough Advair to last through the end of the year. New rx sent to pharmacy, patient aware to make change once she runs out of Advair.

## 2023-05-26 NOTE — Patient Instructions (Signed)
Durolane injections today See me again in

## 2023-06-01 ENCOUNTER — Encounter: Payer: Self-pay | Admitting: Family

## 2023-06-01 ENCOUNTER — Ambulatory Visit (INDEPENDENT_AMBULATORY_CARE_PROVIDER_SITE_OTHER): Payer: 59 | Admitting: Family

## 2023-06-01 ENCOUNTER — Telehealth: Payer: Self-pay | Admitting: Sports Medicine

## 2023-06-01 VITALS — BP 118/62 | HR 99 | Ht 62.0 in | Wt 153.4 lb

## 2023-06-01 DIAGNOSIS — R7989 Other specified abnormal findings of blood chemistry: Secondary | ICD-10-CM | POA: Diagnosis not present

## 2023-06-01 DIAGNOSIS — Z13220 Encounter for screening for lipoid disorders: Secondary | ICD-10-CM | POA: Diagnosis not present

## 2023-06-01 DIAGNOSIS — D509 Iron deficiency anemia, unspecified: Secondary | ICD-10-CM

## 2023-06-01 DIAGNOSIS — M545 Low back pain, unspecified: Secondary | ICD-10-CM

## 2023-06-01 DIAGNOSIS — E118 Type 2 diabetes mellitus with unspecified complications: Secondary | ICD-10-CM

## 2023-06-01 LAB — COMPREHENSIVE METABOLIC PANEL
ALT: 67 U/L — ABNORMAL HIGH (ref 0–35)
AST: 46 U/L — ABNORMAL HIGH (ref 0–37)
Albumin: 3.7 g/dL (ref 3.5–5.2)
Alkaline Phosphatase: 101 U/L (ref 39–117)
BUN: 12 mg/dL (ref 6–23)
CO2: 28 meq/L (ref 19–32)
Calcium: 8.9 mg/dL (ref 8.4–10.5)
Chloride: 99 meq/L (ref 96–112)
Creatinine, Ser: 0.77 mg/dL (ref 0.40–1.20)
GFR: 74.18 mL/min (ref >60.00)
Glucose, Bld: 92 mg/dL (ref 70–99)
Potassium: 4.1 meq/L (ref 3.5–5.1)
Sodium: 133 meq/L — ABNORMAL LOW (ref 135–145)
Total Bilirubin: 0.5 mg/dL (ref 0.2–1.2)
Total Protein: 7.2 g/dL (ref 6.0–8.3)

## 2023-06-01 LAB — CBC WITH DIFFERENTIAL/PLATELET
Basophils Absolute: 0 10*3/uL (ref 0.0–0.1)
Basophils Relative: 0.4 % (ref 0.0–3.0)
Eosinophils Absolute: 0.4 10*3/uL (ref 0.0–0.7)
Eosinophils Relative: 5 % (ref 0.0–5.0)
HCT: 37.6 % (ref 36.0–46.0)
Hemoglobin: 12.1 g/dL (ref 12.0–15.0)
Lymphocytes Relative: 28.4 % (ref 12.0–46.0)
Lymphs Abs: 2.2 10*3/uL (ref 0.7–4.0)
MCHC: 32.3 g/dL (ref 30.0–36.0)
MCV: 84.9 fL (ref 78.0–100.0)
Monocytes Absolute: 0.9 10*3/uL (ref 0.1–1.0)
Monocytes Relative: 10.9 % (ref 3.0–12.0)
Neutro Abs: 4.4 10*3/uL (ref 1.4–7.7)
Neutrophils Relative %: 55.3 % (ref 43.0–77.0)
Platelets: 244 10*3/uL (ref 150.0–400.0)
RBC: 4.42 Mil/uL (ref 3.87–5.11)
RDW: 14.2 % (ref 11.5–15.5)
WBC: 7.9 10*3/uL (ref 4.0–10.5)

## 2023-06-01 LAB — IBC + FERRITIN
Ferritin: 119.8 ng/mL (ref 10.0–291.0)
Iron: 142 ug/dL (ref 42–145)
Saturation Ratios: 51 % — ABNORMAL HIGH (ref 20.0–50.0)
TIBC: 278.6 ug/dL (ref 250.0–450.0)
Transferrin: 199 mg/dL — ABNORMAL LOW (ref 212.0–360.0)

## 2023-06-01 LAB — LIPID PANEL
Cholesterol: 123 mg/dL (ref 0–200)
HDL: 36.3 mg/dL — ABNORMAL LOW (ref >39.00)
LDL Cholesterol: 71 mg/dL (ref 0–99)
NonHDL: 86.78
Total CHOL/HDL Ratio: 3
Triglycerides: 79 mg/dL (ref 0.0–149.0)
VLDL: 15.8 mg/dL (ref 0.0–40.0)

## 2023-06-01 LAB — VITAMIN B12: Vitamin B-12: 829 pg/mL (ref 211–911)

## 2023-06-01 LAB — MICROALBUMIN / CREATININE URINE RATIO
Creatinine,U: 174.1 mg/dL
Microalb Creat Ratio: 14.5 mg/g (ref 0.0–30.0)
Microalb, Ur: 25.3 mg/dL — ABNORMAL HIGH (ref 0.0–1.9)

## 2023-06-01 MED ORDER — LIDOCAINE 5 % EX PTCH: 1.0000 | MEDICATED_PATCH | Freq: Two times a day (BID) | CUTANEOUS | 2 refills | Status: DC

## 2023-06-01 NOTE — Progress Notes (Signed)
Amy Escobar is a 77 y.o. 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 without macular edema, without long-term current use of insulin (HCC) 06/18/2021   Diabetes mellitus (HCC) 06/18/2021   Osteoarthritis of right AC (acromioclavicular) joint 03/06/2021   Osteoarthritis of right glenohumeral joint 03/06/2021   Foot pain, bilateral 12/26/2020   Diabetic neuropathy (HCC) 01/10/2020   Hav (hallux abducto valgus), unspecified laterality 01/10/2020   Chronic arthropathy 01/10/2020   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 edema associated with type 2 diabetes mellitus (HCC) 12/26/2019   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   AC (acromioclavicular) arthritis 08/02/2019   Unspecified inflammatory spondylopathy, 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   Claudication (HCC) 04/19/2019   Morbid obesity (HCC) 04/19/2019   Suspected COVID-19 virus infection 04/13/2019   Upper airway cough syndrome 08/22/2018   Greater trochanteric bursitis of both hips 06/15/2018   Trigger point of shoulder region, left 02/07/2018   Hypertension    Mild pulmonary hypertension (HCC)    NSVT (nonsustained ventricular tachycardia) (HCC)    PVC's (premature ventricular contractions)    URI (upper respiratory infection) 08/09/2016   Neck pain 02/10/2016   Urinary incontinence 02/10/2016   Degenerative arthritis of knee, bilateral 07/17/2015   Knee MCL sprain 06/07/2015   Discomfort in chest 02/15/2015   Chest pain 02/15/2015   DM type 2, controlled, with complication (HCC)    Wellness examination 08/06/2014   Acute meniscal tear of  knee 02/13/2014   Primary localized osteoarthrosis, lower leg 02/13/2014   Gastrocnemius tear 12/25/2013   UTI (urinary tract infection) 12/20/2013   Right knee pain 12/20/2013   Pulmonary hypertension (HCC) 10/06/2013   DOE (dyspnea on exertion) 08/04/2013   Dysphagia 08/10/2012   Hip pain, left 02/02/2012   Painful respiration 05/25/2011   Encounter for long-term (current) use of other medications 01/30/2011   PELVIC PAIN, LEFT 07/30/2010   TOBACCO USE, QUIT 07/07/2010   FATIGUE 12/20/2009   Headache 12/20/2009   Low back pain 12/26/2008   Disorder of liver 04/13/2008   FOOT PAIN, BILATERAL 04/13/2008   FIBROIDS, UTERUS 11/24/2007   THYROID NODULE, LEFT 11/24/2007   HYPERCHOLESTEROLEMIA 11/24/2007   CARPAL TUNNEL SYNDROME, BILATERAL 11/24/2007   ALKALINE PHOSPHATASE, ELEVATED 11/24/2007   Gout 08/10/2007   ANXIETY 08/10/2007   Essential hypertension 08/10/2007   Cough 08/01/2007   Perennial and seasonal allergic rhinitis 01/14/2007   GERD 01/14/2007   Osteoporosis 01/14/2007    Current Outpatient Medications  Medication Sig Dispense Refill   alendronate (FOSAMAX) 70 MG tablet Take 70 mg by mouth once a week.     AMBULATORY NON FORMULARY MEDICATION Left wrist brace 1 Piece 0   aspirin EC 81 MG tablet Take 81 mg by mouth daily. Swallow whole.     Blood Glucose Monitoring Suppl (ONETOUCH VERIO FLEX SYSTEM) w/Device KIT USE TO TEST BLOOD SUGAR DAILY AS DIRECTED 1 kit 0   budesonide-formoterol (SYMBICORT) 160-4.5 MCG/ACT inhaler Inhale 2 puffs into the lungs 2 (two) times daily. 3 each 2   calcium elemental as carbonate (ANTACID MAXIMUM) 400 MG chewable tablet Chew 1,000 mg by mouth as needed for heartburn.     carvedilol (COREG) 12.5 MG tablet TAKE 1  TABLET BY MOUTH TWO TIMES A DAY WITH A MEAL 180 tablet 2   celecoxib (CELEBREX) 200 MG capsule TAKE 1 CAPSULE BY MOUTH DAILY 30 capsule 1   cholecalciferol (VITAMIN D3) 25 MCG (1000 UNIT) tablet Take 1,000 Units by mouth daily.      DULoxetine (CYMBALTA) 30 MG capsule Take 30 mg by mouth daily.     esomeprazole (NEXIUM) 40 MG capsule Take 1 capsule (40 mg total) by mouth 2 (two) times daily before a meal. 180 capsule 0   famotidine (PEPCID) 20 MG tablet Take 1 tablet (20 mg total) by mouth at bedtime. 90 tablet 2   fluticasone (FLONASE) 50 MCG/ACT nasal spray Place 1 spray into both nostrils daily. Begin by using 2 sprays in each nare daily for 3 to 5 days, then decrease to 1 spray in each nare daily. 15.8 mL 2   gabapentin (NEURONTIN) 100 MG capsule Take 1 capsule (100 mg total) by mouth at bedtime. 90 capsule 3   glucose blood (ONETOUCH VERIO) test strip USE TO CHECK BLOOD SUGAE TWO TIMES A DAY 100 strip 3   hydrocortisone 2.5 % cream Apply topically 2 (two) times daily. 30 g 0   levocetirizine (XYZAL) 5 MG tablet Take 1 tablet (5 mg total) by mouth every evening. 90 tablet 1   Olopatadine HCl 0.2 % SOLN Apply 1 drop to eye daily as needed. 2.5 mL 5   Olopatadine-Mometasone (RYALTRIS) 665-25 MCG/ACT SUSP Place 2 sprays into the nose in the morning and at bedtime. 29 g 5   Pirfenidone (ESBRIET) 801 MG TABS Take 1 tablet (801 mg total) by mouth 3 (three) times daily with meals. 90 tablet 2   rosuvastatin (CRESTOR) 20 MG tablet TAKE 1 TABLET BY MOUTH DAILY 90 tablet 2   Semaglutide,0.25 or 0.5MG /DOS, 2 MG/3ML SOPN Inject 0.5 mg into the skin once a week. 6 mL 3   Turmeric (QC TUMERIC COMPLEX) 500 MG CAPS Take by mouth.     valsartan (DIOVAN) 160 MG tablet Take 1 tablet (160 mg total) by mouth daily. 90 tablet 3   lidocaine (LIDODERM) 5 % Place 1 patch onto the skin every 12 (twelve) hours. Remove & Discard patch within 12 hours or as directed by MD 30 patch 2   montelukast (SINGULAIR) 10 MG tablet Take 1 tablet (10 mg total) by mouth daily. (Patient not taking: Reported on 06/01/2023) 90 tablet 2   No current facility-administered medications for this visit.    Allergies: Olmesartan medoxomil  Past Medical History:   Diagnosis Date   Alkaline phosphatase deficiency    w/u Ne   Allergic rhinitis    Allergy    Anxiety    Arthritis    Cataract    BILATERAL-REMOVED   DM2 (diabetes mellitus, type 2) (HCC)    GERD (gastroesophageal reflux disease)    Gout    Hemorrhoids    Hyperlipidemia    Hypertension    Interstitial lung disease (HCC)    Mild pulmonary hypertension (HCC)    NSVT (nonsustained ventricular tachycardia) (HCC)    Osteoporosis    PVC's (premature ventricular contractions)    Thyroid nodule    small   Tubular adenoma of colon 2017   Urticaria    Uterine fibroid     Past Surgical History:  Procedure Laterality Date   CATARACT EXTRACTION Bilateral 2016   COLONOSCOPY  2007   echocardiogram (other)  01/16/2002   POLYPECTOMY     removed tumors from foot nerves  04/1999  stress cardiolite  02/12/2006   TOENAIL EXCISION     TUBAL LIGATION     TYMPANOSTOMY TUBE PLACEMENT      Family History  Problem Relation Age of Onset   Heart disease Mother    Allergic rhinitis Mother    Heart disease Father    Stomach cancer Maternal Grandmother    Heart disease Maternal Grandfather    Rectal cancer Maternal Grandfather    Colon cancer Neg Hx    Breast cancer Neg Hx    Colon polyps Neg Hx    Esophageal cancer Neg Hx     Social History   Tobacco Use   Smoking status: Former    Current packs/day: 0.00    Average packs/day: 0.3 packs/day for 5.0 years (1.3 ttl pk-yrs)    Types: Cigarettes    Start date: 06/30/1963    Quit date: 06/29/1968    Years since quitting: 54.9   Smokeless tobacco: Never  Substance Use Topics   Alcohol use: No    Alcohol/week: 0.0 standard drinks of alcohol    Subjective:   6 month follow up on chronic care needs- Has been diagnosed with ILD since she was last seen in our office/ scheduled to see pulmonology in January 2025;  Would like to get her labs updated- specifically asking for B12 and iron levels to be checked;     Objective:  Vitals:    06/01/23 0844  BP: 118/62  Pulse: 99  SpO2: 97%  Weight: 153 lb 6.4 oz (69.6 kg)  Height: 5\' 2"  (1.575 m)    General: Well developed, well nourished, in no acute distress  Skin : Warm and dry.  Head: Normocephalic and atraumatic  Eyes: Sclera and conjunctiva clear; pupils round and reactive to light; extraocular movements intact  Ears: External normal; canals clear; tympanic membranes normal  Oropharynx: Pink, supple. No suspicious lesions  Neck: Supple without thyromegaly, adenopathy  Lungs: Respirations unlabored; clear to auscultation bilaterally without wheeze, rales, rhonchi  CVS exam: normal rate and regular rhythm.  Neurologic: Alert and oriented; speech intact; face symmetrical; moves all extremities well; CNII-XII intact without focal deficit   Assessment:  1. Low vitamin B12 level   2. Iron deficiency anemia, unspecified iron deficiency anemia type   3. Lipid screening   4. DM type 2, controlled, with complication (HCC)     Plan:  Will update labs today as requested by patient;  She will continue with her pulmonologist and endocrinologist as scheduled;   Follow up in 6 months, sooner prn.   Time spent 30 minutes  No follow-ups on file.  Orders Placed This Encounter  Procedures   CBC with Differential/Platelet   Comp Met (CMET)   B12   IBC + Ferritin   Lipid panel   Urine Microalbumin w/creat. ratio    Requested Prescriptions    No prescriptions requested or ordered in this encounter

## 2023-06-01 NOTE — Telephone Encounter (Signed)
Patient called stating that her legs are feeling so much better since her last visit.  She is now having a lot of pain in her back and asked if Dr Jean Rosenthal would be able to send in Lidocaine patches for her?  Please advise.

## 2023-06-01 NOTE — Patient Instructions (Signed)
Please discuss using lidocaine patches with Dr. Jean Rosenthal;

## 2023-06-03 ENCOUNTER — Telehealth: Payer: Self-pay

## 2023-06-03 DIAGNOSIS — N1330 Unspecified hydronephrosis: Secondary | ICD-10-CM | POA: Diagnosis not present

## 2023-06-03 DIAGNOSIS — N3020 Other chronic cystitis without hematuria: Secondary | ICD-10-CM | POA: Diagnosis not present

## 2023-06-03 DIAGNOSIS — R3914 Feeling of incomplete bladder emptying: Secondary | ICD-10-CM | POA: Diagnosis not present

## 2023-06-03 MED ORDER — LEVOCETIRIZINE DIHYDROCHLORIDE 5 MG PO TABS: 5.0000 mg | ORAL_TABLET | Freq: Every evening | ORAL | 1 refills | Status: DC

## 2023-06-03 NOTE — Telephone Encounter (Signed)
Patient came by to see if she could get a refill on the Xyzal sent to her pharmacy. I informed the patient it is OTC and may not be covered. She said it was last time she got it.   [address] [facility] - [place], [place] - [address]

## 2023-06-03 NOTE — Telephone Encounter (Signed)
Refill has been sent to the requested pharmacy

## 2023-06-04 DIAGNOSIS — R339 Retention of urine, unspecified: Secondary | ICD-10-CM | POA: Diagnosis not present

## 2023-06-05 ENCOUNTER — Telehealth: Payer: Self-pay | Admitting: Internal Medicine

## 2023-06-05 NOTE — Telephone Encounter (Signed)
    Call from Laren Amy Escobar to answering service : c/o nausea post esbriet. Called back 6:11 PM 06/05/2023 - went to VM. Advised her to stop the esbriet  Triage pls try calling patient again  Called son [phone] but call did not go through 6:12 PM Then called dauhter [phone] 6:12 PM but gagin call did not go through The called patient again [phone] - kept ringing -6:13 PM -> she answered   - says the esbreit making her drowsy, dizzy, diarrhea (started 05/05/23) - taking it 3 pills three times daily  Plan  - stop it for 1 weeks - retstart 1 tab 3x/day x 2 week -> then go to 2 tab 3x/day -> do not escalate but in between if SE she is to call back  - keep followup early Jan 2025 with Chales Abrahams    Dr. Kalman Shan, M.D., F.C.C.P,  Pulmonary and Critical Care Medicine Staff Physician, [facility] Center Director - Interstitial Lung Disease  Program  Pulmonary Fibrosis [facility] at [facility] [place], [place], [address]   Pager: [phone], If no answer  -> Check AMION or Try [phone] Telephone (clinical office): [phone] Telephone (research): [phone]  6:11 PM 06/05/2023

## 2023-06-08 ENCOUNTER — Ambulatory Visit: Payer: 59 | Admitting: Internal Medicine

## 2023-06-14 ENCOUNTER — Telehealth: Payer: Self-pay | Admitting: Internal Medicine

## 2023-06-14 NOTE — Telephone Encounter (Signed)
Yes go to ER

## 2023-06-14 NOTE — Telephone Encounter (Signed)
Primary Pulmonologist: Ramaswamy Last office visit and with whom: 04/20/2023 Ramaswamy What do we see them for (pulmonary problems): ILD, reflux, seasonal allergies, elevated IgE level, house dust mite allergy Last OV assessment/plan:    Plan:     Patient Instructions  ILD (interstitial lung disease) (HCC) IPF (idiopathic pulmonary fibrosis) (HCC)   -Giving a provisional diagnosis of idiopathic pulmonary fibrosis [IPF].  This is based on age greater than 35, disease predominantly in the lower lobe, probable UIP description on the CT scan, progression since 2020 [he had normal pulmonary function test in 2015], and near normal serology blood work   Plan  - Take ILD questionnaire and fill this up - Start pirfenidone per protocol             - Check LFT 04/20/2023             -Discussed this medicine in detail             -CMA to initiate paperwork -Clinical trial considerations in the future     Seasonal allergies Elevated IgE level House dust mite allergy     Plan   - refer allergist   Follow-up - 6-8-week visit with Dr. Marchelle Gearing or nurse practitioner 30-minute to see pirfenidone uptake     FOLLOWUP Return in about 7 weeks (around 06/08/2023) for 30 min visit, ILD, with Dr Marchelle Gearing, Face to Face OR Video Visit.       SIGNATURE      Dr. Kalman Shan, M.D., F.C.C.P,  Pulmonary and Critical Care Medicine Staff Physician, [facility] Center Director - Interstitial Lung Disease  Program  Pulmonary Fibrosis [facility] at [facility] [place], [place], [address]   Pager: [phone], If no answer or between  15:00h - 7:00h: call [phone] Telephone: [phone]   11:44 AM 04/20/2023      Patient Instructions by Kalman Shan, MD at 04/20/2023 11:00 AM  Author: Kalman Shan, MD Author Type: Physician Filed: 04/20/2023 12:13 PM  Note Status: Addendum Sebastian Ache: Cosign Not Required Encounter Date: 04/20/2023  Editor:  Kalman Shan, MD (Physician)      Prior Versions: 1. Kalman Shan, MD (Physician) at 04/20/2023 12:13 PM - Addendum   2. Kalman Shan, MD (Physician) at 04/20/2023 11:45 AM - Addendum   3. Kalman Shan, MD (Physician) at 04/20/2023 11:44 AM - Addendum   4. Kalman Shan, MD (Physician) at 04/20/2023 11:43 AM - Addendum   5. Kalman Shan, MD (Physician) at 04/20/2023 11:41 AM - Signed  ILD (interstitial lung disease) (HCC) IPF (idiopathic pulmonary fibrosis) (HCC)   -Giving a provisional diagnosis of idiopathic pulmonary fibrosis [IPF].  This is based on age greater than 58, disease predominantly in the lower lobe, probable UIP description on the CT scan, progression since 2020 [he had normal pulmonary function test in 2015], and near normal serology blood work   Plan  - Take ILD questionnaire and fill this up - Start pirfenidone per protocol            - Check LFT 04/20/2023            -Discussed this medicine in detail            -CMA to initiate paperwork -Clinical trial considerations in the future'     GERD   Plan  - Address at next visit including Fosamax use   Seasonal allergies Elevated IgE level House dust mite allergy  Plan   - refer allergist - continue curernt inahler   Follow-up - 6-8-week visit with Dr. Marchelle Gearing or nurse practitioner 30-minute to see pirfenidone uptak       Was appointment offered to patient (explain)?  declined   Reason for call: Called and spoke with patient, she states she got sick last week.  Coughing for a week or more, with clear mucous (more mucous than usual).  She has been short of breath for about a week, she was unable to check her sats as her pulse ox needs new batteries.  She was out of breath just getting off the couch.  She is using Symbicort, not Advair (insurance will not cover in 2025).  She stopped the Esbriet per Dr. Marchelle Gearing.  She started back on it Saturday, one tablet.  Does not have a  rescue inhaler/nebulizer and is not on oxygen.  She is on the gabapentin twice a day, nothing for allergies at this time.  I advised her that she may need to go the the ED given the degree of her SOB.  I let her know I would send a message to Dr. Marchelle Gearing and once we hear back from him we will give her a call back with his recommendations.  Dr. Marchelle Gearing, please advise.  Thank you.  (examples of things to ask: : When did symptoms start? Fever? Cough? Productive? Color to sputum? More sputum than usual? Wheezing? Have you needed increased oxygen? Are you taking your respiratory medications? What over the counter measures have you tried?)  Allergies  Allergen Reactions   Olmesartan Medoxomil     REACTION: headache    Immunization History  Administered Date(s) Administered   Fluad Quad(high Dose 65+) 04/18/2019   Influenza Split 03/23/2011, 04/07/2013   Influenza Whole 04/13/2008, 04/29/2009, 03/29/2010, 03/29/2018   Influenza, High Dose Seasonal PF 03/11/2023   Influenza-Unspecified 04/08/2012, 03/28/2014, 03/14/2015, 03/13/2016, 04/15/2020   PFIZER(Purple Top)SARS-COV-2 Vaccination 08/13/2019, 09/05/2019, 04/15/2020   PPD Test 03/23/2011   Pfizer Covid-19 Vaccine Bivalent Booster 59yrs & up 04/29/2021   Pneumococcal Conjugate-13 01/10/2014   Pneumococcal Polysaccharide-23 07/07/2010, 03/31/2018   Td 12/27/2001   Tdap 08/06/2014   Zoster Recombinant(Shingrix) 03/31/2018

## 2023-06-15 ENCOUNTER — Inpatient Hospital Stay (HOSPITAL_BASED_OUTPATIENT_CLINIC_OR_DEPARTMENT_OTHER)
Admission: EM | Admit: 2023-06-15 | Discharge: 2023-06-19 | DRG: 196 | Disposition: A | Discharge status: Home / Self Care | Payer: 59 | Attending: Internal Medicine | Admitting: Internal Medicine

## 2023-06-15 ENCOUNTER — Emergency Department (HOSPITAL_BASED_OUTPATIENT_CLINIC_OR_DEPARTMENT_OTHER): Payer: 59

## 2023-06-15 ENCOUNTER — Emergency Department (HOSPITAL_BASED_OUTPATIENT_CLINIC_OR_DEPARTMENT_OTHER): Payer: 59 | Admitting: Radiology

## 2023-06-15 ENCOUNTER — Encounter (HOSPITAL_BASED_OUTPATIENT_CLINIC_OR_DEPARTMENT_OTHER): Payer: Self-pay | Admitting: Emergency Medicine

## 2023-06-15 ENCOUNTER — Other Ambulatory Visit: Payer: Self-pay

## 2023-06-15 DIAGNOSIS — E7800 Pure hypercholesterolemia, unspecified: Secondary | ICD-10-CM | POA: Diagnosis not present

## 2023-06-15 DIAGNOSIS — I2510 Atherosclerotic heart disease of native coronary artery without angina pectoris: Secondary | ICD-10-CM | POA: Diagnosis present

## 2023-06-15 DIAGNOSIS — J849 Interstitial pulmonary disease, unspecified: Secondary | ICD-10-CM

## 2023-06-15 DIAGNOSIS — Z7982 Long term (current) use of aspirin: Secondary | ICD-10-CM

## 2023-06-15 DIAGNOSIS — Z1152 Encounter for screening for COVID-19: Secondary | ICD-10-CM

## 2023-06-15 DIAGNOSIS — Z7983 Long term (current) use of bisphosphonates: Secondary | ICD-10-CM

## 2023-06-15 DIAGNOSIS — R059 Cough, unspecified: Secondary | ICD-10-CM | POA: Diagnosis not present

## 2023-06-15 DIAGNOSIS — Z7951 Long term (current) use of inhaled steroids: Secondary | ICD-10-CM | POA: Diagnosis not present

## 2023-06-15 DIAGNOSIS — Z8249 Family history of ischemic heart disease and other diseases of the circulatory system: Secondary | ICD-10-CM | POA: Diagnosis not present

## 2023-06-15 DIAGNOSIS — K219 Gastro-esophageal reflux disease without esophagitis: Secondary | ICD-10-CM | POA: Diagnosis present

## 2023-06-15 DIAGNOSIS — I10 Essential (primary) hypertension: Secondary | ICD-10-CM | POA: Diagnosis not present

## 2023-06-15 DIAGNOSIS — Z888 Allergy status to other drugs, medicaments and biological substances status: Secondary | ICD-10-CM

## 2023-06-15 DIAGNOSIS — M1990 Unspecified osteoarthritis, unspecified site: Secondary | ICD-10-CM | POA: Diagnosis present

## 2023-06-15 DIAGNOSIS — I272 Pulmonary hypertension, unspecified: Secondary | ICD-10-CM | POA: Diagnosis present

## 2023-06-15 DIAGNOSIS — R590 Localized enlarged lymph nodes: Secondary | ICD-10-CM | POA: Diagnosis present

## 2023-06-15 DIAGNOSIS — Z6826 Body mass index (BMI) 26.0-26.9, adult: Secondary | ICD-10-CM

## 2023-06-15 DIAGNOSIS — E1142 Type 2 diabetes mellitus with diabetic polyneuropathy: Secondary | ICD-10-CM | POA: Diagnosis not present

## 2023-06-15 DIAGNOSIS — Z860101 Personal history of adenomatous and serrated colon polyps: Secondary | ICD-10-CM | POA: Diagnosis not present

## 2023-06-15 DIAGNOSIS — R636 Underweight: Secondary | ICD-10-CM | POA: Diagnosis not present

## 2023-06-15 DIAGNOSIS — M109 Gout, unspecified: Secondary | ICD-10-CM | POA: Diagnosis present

## 2023-06-15 DIAGNOSIS — R0602 Shortness of breath: Secondary | ICD-10-CM | POA: Diagnosis not present

## 2023-06-15 DIAGNOSIS — J9601 Acute respiratory failure with hypoxia: Principal | ICD-10-CM | POA: Diagnosis present

## 2023-06-15 DIAGNOSIS — J84112 Idiopathic pulmonary fibrosis: Secondary | ICD-10-CM | POA: Diagnosis not present

## 2023-06-15 DIAGNOSIS — R918 Other nonspecific abnormal finding of lung field: Secondary | ICD-10-CM | POA: Diagnosis not present

## 2023-06-15 DIAGNOSIS — M810 Age-related osteoporosis without current pathological fracture: Secondary | ICD-10-CM | POA: Diagnosis present

## 2023-06-15 DIAGNOSIS — J189 Pneumonia, unspecified organism: Principal | ICD-10-CM | POA: Diagnosis present

## 2023-06-15 DIAGNOSIS — Z7985 Long-term (current) use of injectable non-insulin antidiabetic drugs: Secondary | ICD-10-CM

## 2023-06-15 DIAGNOSIS — R058 Other specified cough: Secondary | ICD-10-CM | POA: Diagnosis not present

## 2023-06-15 DIAGNOSIS — F419 Anxiety disorder, unspecified: Secondary | ICD-10-CM | POA: Diagnosis present

## 2023-06-15 DIAGNOSIS — Z800 Family history of malignant neoplasm of digestive organs: Secondary | ICD-10-CM | POA: Diagnosis not present

## 2023-06-15 DIAGNOSIS — Z87891 Personal history of nicotine dependence: Secondary | ICD-10-CM

## 2023-06-15 DIAGNOSIS — I493 Ventricular premature depolarization: Secondary | ICD-10-CM | POA: Diagnosis present

## 2023-06-15 DIAGNOSIS — Z79899 Other long term (current) drug therapy: Secondary | ICD-10-CM

## 2023-06-15 LAB — CBC
HCT: 37.9 % (ref 36.0–46.0)
Hemoglobin: 12.1 g/dL (ref 12.0–15.0)
MCH: 26.8 pg (ref 26.0–34.0)
MCHC: 31.9 g/dL (ref 30.0–36.0)
MCV: 83.8 fL (ref 80.0–100.0)
Platelets: 368 10*3/uL (ref 150–400)
RBC: 4.52 MIL/uL (ref 3.87–5.11)
RDW: 14.8 % (ref 11.5–15.5)
WBC: 8.8 10*3/uL (ref 4.0–10.5)
nRBC: 0 % (ref 0.0–0.2)

## 2023-06-15 LAB — RESP PANEL BY RT-PCR (RSV, FLU A&B, COVID)  RVPGX2
Influenza A by PCR: NEGATIVE
Influenza B by PCR: NEGATIVE
Resp Syncytial Virus by PCR: NEGATIVE
SARS Coronavirus 2 by RT PCR: NEGATIVE

## 2023-06-15 LAB — BASIC METABOLIC PANEL
Anion gap: 8 (ref 5–15)
BUN: 11 mg/dL (ref 8–23)
CO2: 25 mmol/L (ref 22–32)
Calcium: 9.2 mg/dL (ref 8.9–10.3)
Chloride: 99 mmol/L (ref 98–111)
Creatinine, Ser: 0.84 mg/dL (ref 0.44–1.00)
GFR, Estimated: >60 mL/min (ref >60)
Glucose, Bld: 101 mg/dL — ABNORMAL HIGH (ref 70–99)
Potassium: 4.2 mmol/L (ref 3.5–5.1)
Sodium: 132 mmol/L — ABNORMAL LOW (ref 135–145)

## 2023-06-15 LAB — BRAIN NATRIURETIC PEPTIDE: B Natriuretic Peptide: 76.5 pg/mL (ref 0.0–100.0)

## 2023-06-15 LAB — TROPONIN I (HIGH SENSITIVITY): Troponin I (High Sensitivity): 5 ng/L (ref <18)

## 2023-06-15 MED ORDER — IOHEXOL 350 MG/ML SOLN
100.0000 mL | Freq: Once | INTRAVENOUS | Status: AC | PRN
  Administered 2023-06-15: 75 mL via INTRAVENOUS

## 2023-06-15 MED ORDER — SODIUM CHLORIDE 0.9 % IV SOLN
500.0000 mg | Freq: Once | INTRAVENOUS | Status: AC
  Administered 2023-06-15: 500 mg via INTRAVENOUS
  Filled 2023-06-15: qty 5

## 2023-06-15 MED ORDER — SODIUM CHLORIDE 0.9 % IV SOLN
1.0000 g | Freq: Once | INTRAVENOUS | Status: AC
  Administered 2023-06-15: 1 g via INTRAVENOUS
  Filled 2023-06-15: qty 10

## 2023-06-15 MED ORDER — DEXAMETHASONE SODIUM PHOSPHATE 10 MG/ML IJ SOLN
10.0000 mg | Freq: Once | INTRAMUSCULAR | Status: AC
  Administered 2023-06-15: 10 mg via INTRAVENOUS
  Filled 2023-06-15: qty 1

## 2023-06-15 NOTE — ED Triage Notes (Signed)
Sob x 1 wk. Cough productive clear. Worse with activity. Labored after walking to triage

## 2023-06-15 NOTE — Telephone Encounter (Signed)
Spoke with the pt and informed of response per MR  Nothing further needed

## 2023-06-15 NOTE — ED Provider Notes (Signed)
[place] EMERGENCY DEPARTMENT AT [facility] Provider Note   CSN: [id number] Arrival date & time: 06/15/23  1318     History  Chief Complaint  Patient presents with   Shortness of Breath    Amy Escobar is a 77 y.o. female history intentional lung disease, pulmonary hypertension, diabetes, GERD presented with shortness of breath for the past week.  Patient has had clear sputum however denies hemoptysis, leg swelling, recent travel hospitalizations or surgeries, previous blood clots.  Patient denies any chest pain.  Patient states she is only short of breath when she exerts itself.  Patient denies leg swelling.  Patient denies nausea vomiting, sick contacts, abdominal pain.  Home Medications Prior to Admission medications   Medication Sig Start Date End Date Taking? Authorizing Provider  alendronate (FOSAMAX) 70 MG tablet Take 70 mg by mouth once a week. 12/06/22   [provider]  AMBULATORY NON FORMULARY MEDICATION Left wrist brace 11/06/21   Richardean Sale, DO  aspirin EC 81 MG tablet Take 81 mg by mouth daily. Swallow whole.    [provider]  Blood Glucose Monitoring Suppl (ONETOUCH VERIO FLEX SYSTEM) w/Device KIT USE TO TEST BLOOD SUGAR DAILY AS DIRECTED 06/19/22   Shamleffer, Konrad Dolores, MD  budesonide-formoterol (SYMBICORT) 160-4.5 MCG/ACT inhaler Inhale 2 puffs into the lungs 2 (two) times daily. 05/26/23   Kalman Shan, MD  calcium elemental as carbonate (ANTACID MAXIMUM) 400 MG chewable tablet Chew 1,000 mg by mouth as needed for heartburn.    [provider]  carvedilol (COREG) 12.5 MG tablet TAKE 1 TABLET BY MOUTH TWO TIMES A DAY WITH A MEAL 12/25/21   Bhagat, [place], PA  celecoxib (CELEBREX) 200 MG capsule TAKE 1 CAPSULE BY MOUTH DAILY 05/24/23   Kirtland Bouchard, PA-C  cholecalciferol (VITAMIN D3) 25 MCG (1000 UNIT) tablet Take 1,000 Units by mouth daily.    [provider]  DULoxetine (CYMBALTA) 30 MG  capsule Take 30 mg by mouth daily.    [provider]  esomeprazole (NEXIUM) 40 MG capsule Take 1 capsule (40 mg total) by mouth 2 (two) times daily before a meal. 04/26/23   Olive Bass, FNP  famotidine (PEPCID) 20 MG tablet Take 1 tablet (20 mg total) by mouth at bedtime. 04/21/22   Meriam Sprague, MD  fluticasone (FLONASE) 50 MCG/ACT nasal spray Place 1 spray into both nostrils daily. Begin by using 2 sprays in each nare daily for 3 to 5 days, then decrease to 1 spray in each nare daily. 12/25/22   Theadora Rama Scales, PA-C  gabapentin (NEURONTIN) 100 MG capsule Take 1 capsule (100 mg total) by mouth at bedtime. 08/31/22   Shamleffer, Konrad Dolores, MD  glucose blood (ONETOUCH VERIO) test strip USE TO CHECK BLOOD SUGAE TWO TIMES A DAY 02/04/22   Shamleffer, Konrad Dolores, MD  hydrocortisone 2.5 % cream Apply topically 2 (two) times daily. 11/24/22   Olive Bass, FNP  levocetirizine (XYZAL) 5 MG tablet Take 1 tablet (5 mg total) by mouth every evening. 06/03/23 11/30/23  Marcelyn Bruins, MD  lidocaine (LIDODERM) 5 % Place 1 patch onto the skin every 12 (twelve) hours. Remove & Discard patch within 12 hours or as directed by MD 06/01/23   Richardean Sale, DO  montelukast (SINGULAIR) 10 MG tablet Take 1 tablet (10 mg total) by mouth daily. Patient not taking: Reported on 06/01/2023 04/21/22   Meriam Sprague, MD  Olopatadine HCl 0.2 % SOLN Apply 1 drop to eye  daily as needed. 05/05/23   Marcelyn Bruins, MD  Olopatadine-Mometasone Cristal Generous) [phone] MCG/ACT SUSP Place 2 sprays into the nose in the morning and at bedtime. 05/05/23   Marcelyn Bruins, MD  Pirfenidone (ESBRIET) 801 MG TABS Take 1 tablet (801 mg total) by mouth 3 (three) times daily with meals. 04/27/23   Kalman Shan, MD  rosuvastatin (CRESTOR) 20 MG tablet TAKE 1 TABLET BY MOUTH DAILY 02/05/23   Alver Sorrow, NP  Semaglutide,0.25 or 0.5MG /DOS, 2 MG/3ML SOPN Inject  0.5 mg into the skin once a week. 08/31/22   Shamleffer, Konrad Dolores, MD  Turmeric (QC TUMERIC COMPLEX) 500 MG CAPS Take by mouth.    [provider]  valsartan (DIOVAN) 160 MG tablet Take 1 tablet (160 mg total) by mouth daily. 09/17/22   Meriam Sprague, MD      Allergies    Olmesartan medoxomil    Review of Systems   Review of Systems  Respiratory:  Positive for shortness of breath.     Physical Exam Updated Vital Signs BP 93/68 (BP Location: Right Arm)   Pulse 100   Temp 98 F (36.7 C)   Resp 17   SpO2 93%  Physical Exam Constitutional:      General: She is not in acute distress. Cardiovascular:     Rate and Rhythm: Normal rate and regular rhythm.     Pulses: Normal pulses.     Heart sounds: Normal heart sounds.  Pulmonary:     Effort: Pulmonary effort is normal. No respiratory distress.     Breath sounds: Normal breath sounds.     Comments: Able to speak in full sentences Musculoskeletal:     Right lower leg: No tenderness. No edema.     Left lower leg: No tenderness. No edema.  Skin:    General: Skin is warm and dry.  Neurological:     Mental Status: She is alert.  Psychiatric:        Mood and Affect: Mood normal.     ED Results / Procedures / Treatments   Labs (all labs ordered are listed, but only abnormal results are displayed) Labs Reviewed  BASIC METABOLIC PANEL - Abnormal; Notable for the following components:      Result Value   Sodium 132 (*)    Glucose, Bld 101 (*)    All other components within normal limits  CBC  TROPONIN I (HIGH SENSITIVITY)  TROPONIN I (HIGH SENSITIVITY)    EKG None  Radiology DG Chest 2 View Result Date: 06/15/2023 CLINICAL DATA:  Shortness of breath.  Productive cough. EXAM: CHEST - 2 VIEW COMPARISON:  Chest radiographs 01/14/2023, 11/06/2021 05/06/2020, 04/05/2020 FINDINGS: Cardiac silhouette is at the upper limits of normal size. Mediastinal contours are within limits. Moderate calcification within  the aortic arch. Interval increase in now moderate diffuse bilateral interstitial thickening. This is superimposed on additional chronic mild moderate interstitial thickening at baseline suggesting interstitial lung disease. No acute buckle airspace opacity. No pleural effusion pneumothorax. Mild dextrocurvature of the midthoracic spine. Mild-to-moderate multilevel degenerative disc changes of the thoracic spine. IMPRESSION: 1. Interval increase in now moderate diffuse bilateral interstitial thickening. Findings may represent interstitial pulmonary edema or atypical infection superimposed on chronic interstitial lung disease. 2. Otherwise, no focal acute airspace opacity. Electronically Signed   By: Neita Garnet M.D.   On: 06/15/2023 16:02    Procedures .Critical Care  Performed by: Netta Corrigan, PA-C Authorized by: Netta Corrigan, PA-C   Critical care  provider statement:    Critical care time (minutes):  30   Critical care time was exclusive of:  Separately billable procedures and treating other patients   Critical care was necessary to treat or prevent imminent or life-threatening deterioration of the following conditions:  Respiratory failure   Critical care was time spent personally by me on the following activities:  Development of treatment plan with patient or surrogate, discussions with consultants, blood draw for specimens, evaluation of patient's response to treatment, examination of patient, obtaining history from patient or surrogate, review of old charts, re-evaluation of patient's condition, pulse oximetry, ordering and review of radiographic studies, ordering and review of laboratory studies and ordering and performing treatments and interventions   I assumed direction of critical care for this patient from another provider in my specialty: no     Care discussed with: admitting provider       Medications Ordered in ED Medications - No data to display  ED Course/ Medical  Decision Making/ A&P                                 Medical Decision Making Amount and/or Complexity of Data Reviewed Labs: ordered. Radiology: ordered.  Risk Prescription drug management. Decision regarding hospitalization.   Brandilee Hanko Fiorini 77 y.o. presented today for shortness of breath.  Working DDx that I considered at this time includes, but not limited to, asthma/COPD exacerbation, URI, viral illness, anemia, ACS, PE, pneumonia, pleural effusion, lung cancer.  R/o DDx: asthma/COPD exacerbation, URI, viral illness, anemia, ACS, PE, pleural effusion, lung cancer: These are considered less likely due to history of present illness, physical exam, labs/imaging findings  Review of prior external notes: 06/14/2023 pulmonology telephone  Unique Tests and My Interpretation:  CBC: Unremarkable BMP: Unremarkable EKG: Sinus 94 bpm, prolonged QT, no ST elevations or depressions noted that would be indicative of ischemia Troponin: 5 CXR: Possible bilateral pulmonary edema versus pneumonia CTA Chest PE: Multifocal pneumonia Respiratory Panel: Negative  Social Determinants of Health: none  Discussion with Independent Historian:  Daughter  Discussion of Management of Tests:  [place], MD Hospitalist  Risk: High: hospitalization or escalation of hospital-level care  Risk Stratification Score: none  Staffed with Rubin Payor, MD  Plan: On exam patient was no acute distress with stable vitals however while was evaluate the patient her oxygen was 91% on room air while at rest.  Patient and daughter do not Dors any signs infectious symptoms but stated that she has been coughing up clear sputum and does have interstitial lung disease with pulmonary hypertension.  Exam was ultimately unremarkable however will ambulate patient and get the rest of labs and imaging.  X-ray shows possible edema versus pneumonia however patient not having any infectious symptoms so favor this to be edema.   Patient does not appear fluid overloaded on exam.  Patient was ambulated by RT and dropped down to 84% after 75 feet and was noticeably very short of breath as she was unable to finish a sentence which did improve at rest.  I spoke to the patient and daughter and we agree that patient will need admission due to possible exacerbation of her social lung disease.  Will give steroids and consult hospitalist.  Patient not on oxygen right now as she is currently sitting at 93% room air at rest.  CTA shows multifocal pneumonia and after discussed with hospitalist will start IV antibiotics and admit.  Patient  stable to be admitted at this time.  Patient is currently on 2 L nasal cannula for comfort.  This chart was dictated using voice recognition software.  Despite best efforts to proofread,  errors can occur which can change the documentation meaning.         Final Clinical Impression(s) / ED Diagnoses Final diagnoses:  None    Rx / DC Orders ED Discharge Orders     None         Remi Deter 06/15/23 2152    Benjiman Core, MD 06/15/23 2314

## 2023-06-15 NOTE — ED Notes (Signed)
   06/15/23 1349  Respiratory Assessment  $ RT Protocol Assessment  Yes  Assessment Type Assess only (in tiage)  Respiratory Pattern Regular;Unlabored;Symmetrical  Chest Assessment Chest expansion symmetrical  Cough Productive  Sputum Color White  Sputum Specimen Source Spontaneous cough  Bilateral Breath Sounds Diminished  Oxygen Therapy/Pulse Ox  O2 Therapy Room air  SpO2 93 %   No increased work of breathing noted, BBS decreased coarse, no wheezes noted.  RT to monitor.

## 2023-06-15 NOTE — ED Notes (Signed)
Pt. Refuses to leave BP cuff on

## 2023-06-15 NOTE — ED Notes (Signed)
Patient placed on 2LNC at this time due to desaturations to 86-88% at rest.

## 2023-06-15 NOTE — ED Notes (Signed)
   06/15/23 1822  Resting  Supplemental oxygen during test? No  Resting Heart Rate 89  Resting Sp02 88  Lap 1 (250 feet)  HR 98  02 Sat 84 (At ~ 67feet)   Ambulated approx 75 feet, HR 98, SpO2 84% at lowest on room air.  PA walked with returning to room 11 desat to 84% again. SpO2 recovered after 4 mins rest.

## 2023-06-15 NOTE — ED Notes (Signed)
Food and drink given.

## 2023-06-16 ENCOUNTER — Other Ambulatory Visit: Payer: Self-pay

## 2023-06-16 ENCOUNTER — Encounter (HOSPITAL_COMMUNITY): Payer: Self-pay | Admitting: Internal Medicine

## 2023-06-16 DIAGNOSIS — E1142 Type 2 diabetes mellitus with diabetic polyneuropathy: Secondary | ICD-10-CM | POA: Diagnosis present

## 2023-06-16 DIAGNOSIS — M1990 Unspecified osteoarthritis, unspecified site: Secondary | ICD-10-CM | POA: Diagnosis present

## 2023-06-16 DIAGNOSIS — J189 Pneumonia, unspecified organism: Secondary | ICD-10-CM | POA: Diagnosis present

## 2023-06-16 DIAGNOSIS — J849 Interstitial pulmonary disease, unspecified: Secondary | ICD-10-CM | POA: Diagnosis not present

## 2023-06-16 DIAGNOSIS — R636 Underweight: Secondary | ICD-10-CM | POA: Diagnosis present

## 2023-06-16 DIAGNOSIS — Z6826 Body mass index (BMI) 26.0-26.9, adult: Secondary | ICD-10-CM | POA: Diagnosis not present

## 2023-06-16 DIAGNOSIS — J9601 Acute respiratory failure with hypoxia: Secondary | ICD-10-CM

## 2023-06-16 DIAGNOSIS — Z7982 Long term (current) use of aspirin: Secondary | ICD-10-CM | POA: Diagnosis not present

## 2023-06-16 DIAGNOSIS — Z7983 Long term (current) use of bisphosphonates: Secondary | ICD-10-CM | POA: Diagnosis not present

## 2023-06-16 DIAGNOSIS — Z7985 Long-term (current) use of injectable non-insulin antidiabetic drugs: Secondary | ICD-10-CM | POA: Diagnosis not present

## 2023-06-16 DIAGNOSIS — E7800 Pure hypercholesterolemia, unspecified: Secondary | ICD-10-CM | POA: Diagnosis present

## 2023-06-16 DIAGNOSIS — M810 Age-related osteoporosis without current pathological fracture: Secondary | ICD-10-CM | POA: Diagnosis present

## 2023-06-16 DIAGNOSIS — Z7951 Long term (current) use of inhaled steroids: Secondary | ICD-10-CM | POA: Diagnosis not present

## 2023-06-16 DIAGNOSIS — Z87891 Personal history of nicotine dependence: Secondary | ICD-10-CM | POA: Diagnosis not present

## 2023-06-16 DIAGNOSIS — I2510 Atherosclerotic heart disease of native coronary artery without angina pectoris: Secondary | ICD-10-CM | POA: Diagnosis present

## 2023-06-16 DIAGNOSIS — Z8249 Family history of ischemic heart disease and other diseases of the circulatory system: Secondary | ICD-10-CM | POA: Diagnosis not present

## 2023-06-16 DIAGNOSIS — J84112 Idiopathic pulmonary fibrosis: Secondary | ICD-10-CM

## 2023-06-16 DIAGNOSIS — Z860101 Personal history of adenomatous and serrated colon polyps: Secondary | ICD-10-CM | POA: Diagnosis not present

## 2023-06-16 DIAGNOSIS — M109 Gout, unspecified: Secondary | ICD-10-CM | POA: Diagnosis present

## 2023-06-16 DIAGNOSIS — Z1152 Encounter for screening for COVID-19: Secondary | ICD-10-CM | POA: Diagnosis not present

## 2023-06-16 DIAGNOSIS — I272 Pulmonary hypertension, unspecified: Secondary | ICD-10-CM | POA: Diagnosis not present

## 2023-06-16 DIAGNOSIS — Z800 Family history of malignant neoplasm of digestive organs: Secondary | ICD-10-CM | POA: Diagnosis not present

## 2023-06-16 DIAGNOSIS — I10 Essential (primary) hypertension: Secondary | ICD-10-CM | POA: Diagnosis not present

## 2023-06-16 DIAGNOSIS — K219 Gastro-esophageal reflux disease without esophagitis: Secondary | ICD-10-CM | POA: Diagnosis present

## 2023-06-16 DIAGNOSIS — F419 Anxiety disorder, unspecified: Secondary | ICD-10-CM | POA: Diagnosis present

## 2023-06-16 LAB — RESPIRATORY PANEL BY PCR

## 2023-06-16 LAB — TROPONIN I (HIGH SENSITIVITY): Troponin I (High Sensitivity): 4 ng/L (ref <18)

## 2023-06-16 LAB — GLUCOSE, CAPILLARY
Glucose-Capillary: 154 mg/dL — ABNORMAL HIGH (ref 70–99)
Glucose-Capillary: 109 mg/dL — ABNORMAL HIGH (ref 70–99)
Glucose-Capillary: 143 mg/dL — ABNORMAL HIGH (ref 70–99)
Glucose-Capillary: 134 mg/dL — ABNORMAL HIGH (ref 70–99)

## 2023-06-16 LAB — PROCALCITONIN: Procalcitonin: <0.1 ng/mL

## 2023-06-16 LAB — SEDIMENTATION RATE: Sed Rate: 94 mm/h — ABNORMAL HIGH (ref 0–22)

## 2023-06-16 LAB — C-REACTIVE PROTEIN: CRP: 2.3 mg/dL — ABNORMAL HIGH (ref <1.0)

## 2023-06-16 MED ORDER — MOMETASONE FURO-FORMOTEROL FUM 200-5 MCG/ACT IN AERO
2.0000 | INHALATION_SPRAY | Freq: Two times a day (BID) | RESPIRATORY_TRACT | Status: DC
  Administered 2023-06-16 – 2023-06-19 (×6): 2 via RESPIRATORY_TRACT
  Filled 2023-06-16: qty 8.8

## 2023-06-16 MED ORDER — IRBESARTAN 300 MG PO TABS
150.0000 mg | ORAL_TABLET | Freq: Every day | ORAL | Status: DC
  Administered 2023-06-16 – 2023-06-19 (×4): 150 mg via ORAL
  Filled 2023-06-16 (×4): qty 1

## 2023-06-16 MED ORDER — INSULIN ASPART 100 UNIT/ML IJ SOLN
0.0000 [IU] | Freq: Three times a day (TID) | INTRAMUSCULAR | Status: DC
  Administered 2023-06-19: 1 [IU] via SUBCUTANEOUS

## 2023-06-16 MED ORDER — ASPIRIN 81 MG PO TBEC
81.0000 mg | DELAYED_RELEASE_TABLET | Freq: Every day | ORAL | Status: DC
  Administered 2023-06-16 – 2023-06-19 (×4): 81 mg via ORAL
  Filled 2023-06-16 (×4): qty 1

## 2023-06-16 MED ORDER — POLYETHYLENE GLYCOL 3350 17 G PO PACK: 17.0000 g | PACK | Freq: Every day | ORAL | Status: DC | PRN

## 2023-06-16 MED ORDER — ACETAMINOPHEN 650 MG RE SUPP: 650.0000 mg | Freq: Four times a day (QID) | RECTAL | Status: DC | PRN

## 2023-06-16 MED ORDER — OXYCODONE HCL 5 MG PO TABS: 5.0000 mg | ORAL_TABLET | ORAL | Status: DC | PRN

## 2023-06-16 MED ORDER — DOXYCYCLINE HYCLATE 100 MG PO TABS
100.0000 mg | ORAL_TABLET | Freq: Two times a day (BID) | ORAL | Status: DC
  Administered 2023-06-16 – 2023-06-18 (×6): 100 mg via ORAL
  Filled 2023-06-16 (×6): qty 1

## 2023-06-16 MED ORDER — METHYLPREDNISOLONE SODIUM SUCC 40 MG IJ SOLR
40.0000 mg | Freq: Two times a day (BID) | INTRAMUSCULAR | Status: DC
  Administered 2023-06-16 – 2023-06-19 (×7): 40 mg via INTRAVENOUS
  Filled 2023-06-16 (×7): qty 1

## 2023-06-16 MED ORDER — ROSUVASTATIN CALCIUM 20 MG PO TABS
20.0000 mg | ORAL_TABLET | Freq: Every day | ORAL | Status: DC
  Administered 2023-06-16 – 2023-06-19 (×4): 20 mg via ORAL
  Filled 2023-06-16 (×4): qty 1

## 2023-06-16 MED ORDER — INSULIN ASPART 100 UNIT/ML IJ SOLN: 0.0000 [IU] | Freq: Every day | INTRAMUSCULAR | Status: DC

## 2023-06-16 MED ORDER — ENOXAPARIN SODIUM 40 MG/0.4ML IJ SOSY
40.0000 mg | PREFILLED_SYRINGE | INTRAMUSCULAR | Status: DC
  Administered 2023-06-16 – 2023-06-18 (×3): 40 mg via SUBCUTANEOUS
  Filled 2023-06-16 (×2): qty 0.4

## 2023-06-16 MED ORDER — ALBUTEROL SULFATE (2.5 MG/3ML) 0.083% IN NEBU: 2.5000 mg | INHALATION_SOLUTION | Freq: Four times a day (QID) | RESPIRATORY_TRACT | Status: DC | PRN

## 2023-06-16 MED ORDER — PANTOPRAZOLE SODIUM 40 MG PO TBEC
40.0000 mg | DELAYED_RELEASE_TABLET | Freq: Every day | ORAL | Status: DC
  Administered 2023-06-16 – 2023-06-19 (×4): 40 mg via ORAL
  Filled 2023-06-16 (×4): qty 1

## 2023-06-16 MED ORDER — CEFTRIAXONE SODIUM 1 G IJ SOLR
1.0000 g | INTRAMUSCULAR | Status: DC
  Administered 2023-06-16 – 2023-06-18 (×3): 1 g via INTRAVENOUS
  Filled 2023-06-16 (×3): qty 10

## 2023-06-16 MED ORDER — ACETAMINOPHEN 325 MG PO TABS: 650.0000 mg | ORAL_TABLET | Freq: Four times a day (QID) | ORAL | Status: DC | PRN

## 2023-06-16 MED ORDER — GABAPENTIN 100 MG PO CAPS
100.0000 mg | ORAL_CAPSULE | Freq: Every day | ORAL | Status: DC
  Administered 2023-06-16 – 2023-06-18 (×3): 100 mg via ORAL
  Filled 2023-06-16 (×3): qty 1

## 2023-06-16 MED ORDER — FESOTERODINE FUMARATE ER 4 MG PO TB24
4.0000 mg | ORAL_TABLET | Freq: Every day | ORAL | Status: DC
  Administered 2023-06-16 – 2023-06-19 (×4): 4 mg via ORAL
  Filled 2023-06-16 (×4): qty 1

## 2023-06-16 MED ORDER — HYDRALAZINE HCL 20 MG/ML IJ SOLN: 10.0000 mg | Freq: Four times a day (QID) | INTRAMUSCULAR | Status: DC | PRN

## 2023-06-16 MED ORDER — BISACODYL 5 MG PO TBEC: 5.0000 mg | DELAYED_RELEASE_TABLET | Freq: Every day | ORAL | Status: DC | PRN

## 2023-06-16 NOTE — Progress Notes (Signed)
   06/16/23 0921  TOC Brief Assessment  Insurance and Status Reviewed [facility] Medicare Dual Complete)  Patient has primary care physician Yes [place] Scrape, Allyne Gee, FNP)  Home environment has been reviewed From home  Prior level of function: independent  Prior/Current Home Services No current home services  Social Drivers of Health Review SDOH reviewed no interventions necessary  Readmission risk has been reviewed Yes (N/A listed)  Transition of care needs no transition of care needs at this time   No TOC needs identified at this time  Please place consult should needs arise

## 2023-06-16 NOTE — Plan of Care (Signed)

## 2023-06-16 NOTE — Consult Note (Signed)
NAME:  Amy Escobar, MRN:  [id number], DOB:  Jan 13, 1946, LOS: 0 ADMISSION DATE:  06/15/2023, CONSULTATION DATE:  06/16/23 REFERRING MD:  Pola Corn CHIEF COMPLAINT:  Dyspnea   History of Present Illness:  Amy Escobar is a 77 y.o. female who has a PMH as below including IPF with UIP pattern, followed by Dr. Marchelle Gearing. She was started on Esbriet 04/20/23 but did not tolerate it due to nausea and diarrhea; therefore, it was stopped 06/14/23. She is not normally on O2 at baseline.  On 12/18, she called the pulmonary office for dyspnea and hypoxia and was instructed to come to the ED.  In ED, she initially required 2L O2 and was later increased to 5L via [place]. She also endorsed cough with clear mucus production. She had CTA chest that was negative for PE but showed multifocal PNA along with known IPF and likely reactive lymphadenopathy. She was admitted and started on CAP coverage.  Due to new O2 requirement and being a pt of Dr. Jane Canary, PCCM called to see in consultation.  Pertinent  Medical History:  has FIBROIDS, UTERUS; THYROID NODULE, LEFT; HYPERCHOLESTEROLEMIA; Gout; ANXIETY; CARPAL TUNNEL SYNDROME, BILATERAL; Essential hypertension; Perennial and seasonal allergic rhinitis; GERD; Disorder of liver; Low back pain; FOOT PAIN, BILATERAL; Osteoporosis; FATIGUE; Headache; Cough; ALKALINE PHOSPHATASE, ELEVATED; TOBACCO USE, QUIT; PELVIC PAIN, LEFT; Encounter for long-term (current) use of other medications; Painful respiration; Hip pain, left; Dysphagia; DOE (dyspnea on exertion); Pulmonary hypertension (HCC); UTI (urinary tract infection); Right knee pain; Gastrocnemius tear; Acute meniscal tear of knee; Primary localized osteoarthrosis, lower leg; Wellness examination; Discomfort in chest; DM type 2, controlled, with complication (HCC); Chest pain; Knee MCL sprain; Degenerative arthritis of knee, bilateral; Neck pain; Urinary incontinence; URI (upper respiratory infection); Hypertension; Mild  pulmonary hypertension (HCC); NSVT (nonsustained ventricular tachycardia) (HCC); PVC's (premature ventricular contractions); Trigger point of shoulder region, left; Greater trochanteric bursitis of both hips; Upper airway cough syndrome; Suspected COVID-19 virus infection; Unspecified inflammatory spondylopathy, cervical region [facility]); Claudication [facility]); Morbid obesity (HCC); AC (acromioclavicular) arthritis; 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 edema associated with type 2 diabetes mellitus (HCC); Lattice degeneration of both retinas; Diabetic neuropathy (HCC); Hav (hallux abducto valgus), unspecified laterality; Chronic arthropathy; Foot pain, bilateral; Osteoarthritis of right AC (acromioclavicular) joint; Osteoarthritis of right glenohumeral joint; Type 2 diabetes mellitus with diabetic polyneuropathy, without long-term current use of insulin (HCC); 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 without macular edema, without long-term current use of insulin (HCC); Diabetes mellitus (HCC); Polyneuropathy associated with underlying disease (HCC); and Acute respiratory failure with hypoxia (HCC) on their problem list.  Significant Hospital Events: Including procedures, antibiotic start and stop dates in addition to other pertinent events   12/18 admit. CTA chest >> 1. Negative for acute pulmonary embolism. 2. Pulmonary findings are consistent with multifocal pneumonia superimposed on a background of interstitial lung disease. 3. New mediastinal and hilar lymphadenopathy, likely reactive. RVP >> NEG   Interim History / Subjective:  Comfortable on 5L. Family at bedside. No complaints.  Objective:  Blood pressure 133/74, pulse 82, temperature 97.7 F (36.5 C), temperature source Oral, resp. rate 18, height 5\' 4"  (1.626 m), SpO2 98%.    FiO2 (%):  [1 %] 1 %   Intake/Output Summary (Last 24 hours) at 06/16/2023  1112 Last data filed at 06/15/2023 2214 Gross per 24 hour  Intake 100 ml  Output --  Net 100 ml   There were no vitals filed for  this visit.  Examination: General: Adult female, resting in bed, in NAD. Family at bedside. Neuro: A&O x 3, no deficits. HEENT: [place]/AT. Sclerae anicteric. EOMI. Cardiovascular: RRR, no M/R/G.  Lungs: Respirations even and unlabored.  CTA bilaterally, No W/R/R. Abdomen: BS x 4, soft, NT/ND.  Musculoskeletal: No gross deformities, no edema.  Skin: Intact, warm, no rashes.  Labs/imaging personally reviewed:  CTA chest 12/18 > negative for PE but showed multifocal PNA along with known IPF and likely reactive lymphadenopathy.  Assessment & Plan:   Known IPF/UIP with probable flare 2/2 CAP. - Agree with abx per primary  - IV steroids  - pulmonary hygiene  - mobilize  - titrate O2 to keep sats >90% - pt really does not want to be d/c with O2 although we discussed this as a short term possibility. Will need ambulatory desat prior to d/c  - consider retry esbriet on d/c with slow ramp up (1 tab 3x/day x 2 week -> then go to 2 tab 3x/day -> do not escalate until outpt pulm f/u 07/05/23 with Buelah Manis in pulm office)  Best practice (evaluated daily):  Diet/type: Regular consistency (see orders) DVT prophylaxis: LMWH Pressure ulcer(s): N/A GI prophylaxis: N/A Lines: N/A Foley:  N/A Code Status:  full code Last date of multidisciplinary goals of care discussion: updated pt, son and daughter at bedside 12/18)  Labs   CBC: Recent Labs  Lab 06/15/23 1352  WBC 8.8  HGB 12.1  HCT 37.9  MCV 83.8  PLT 368    Basic Metabolic Panel: Recent Labs  Lab 06/15/23 1352  NA 132*  K 4.2  CL 99  CO2 25  GLUCOSE 101*  BUN 11  CREATININE 0.84  CALCIUM 9.2   GFR: CrCl cannot be calculated (Unknown ideal weight.). Recent Labs  Lab 06/15/23 1352 06/16/23 0417  PROCALCITON  --  <0.10  WBC 8.8  --     Liver Function Tests: No results for input(s):  "AST", "ALT", "ALKPHOS", "BILITOT", "PROT", "ALBUMIN" in the last 168 hours. No results for input(s): "LIPASE", "AMYLASE" in the last 168 hours. No results for input(s): "AMMONIA" in the last 168 hours.  ABG No results found for: "PHART", "PCO2ART", "PO2ART", "HCO3", "TCO2", "ACIDBASEDEF", "O2SAT"   Coagulation Profile: No results for input(s): "INR", "PROTIME" in the last 168 hours.  Cardiac Enzymes: No results for input(s): "CKTOTAL", "CKMB", "CKMBINDEX", "TROPONINI" in the last 168 hours.  HbA1C: Hemoglobin-A1c  Date/Time Value Ref Range Status  07/31/2022 12:36 PM 5.7  Final   Hemoglobin A1C  Date/Time Value Ref Range Status  03/08/2023 10:01 AM 5.9 (A) 4.0 - 5.6 % Final   Hgb A1c MFr Bld  Date/Time Value Ref Range Status  11/19/2022 07:21 AM 5.6 4.8 - 5.6 % Final    Comment:             Prediabetes: 5.7 - 6.4          Diabetes: >6.4          Glycemic control for adults with diabetes: <7.0   03/03/2022 08:49 AM 6.8 (H) 4.6 - 6.5 % Final    Comment:    Glycemic Control Guidelines for People with Diabetes:Non Diabetic:  <6%Goal of Therapy: <7%Additional Action Suggested:  >8%     CBG: Recent Labs  Lab 06/16/23 0252  GLUCAP 154*    Review of Systems:   As per HPI - All other systems reviewed and were neg.    Past Medical History:  She,  has a past  medical history of Alkaline phosphatase deficiency, Allergic rhinitis, Allergy, Anxiety, Arthritis, Cataract, DM2 (diabetes mellitus, type 2) (HCC), GERD (gastroesophageal reflux disease), Gout, Hemorrhoids, Hyperlipidemia, Hypertension, Interstitial lung disease (HCC), Mild pulmonary hypertension (HCC), NSVT (nonsustained ventricular tachycardia) (HCC), Osteoporosis, PVC's (premature ventricular contractions), Thyroid nodule, Tubular adenoma of colon (2017), Urticaria, and Uterine fibroid.   Surgical History:   Past Surgical History:  Procedure Laterality Date   CATARACT EXTRACTION Bilateral 2016   COLONOSCOPY  2007    echocardiogram (other)  01/16/2002   POLYPECTOMY     removed tumors from foot nerves  04/1999   stress cardiolite  02/12/2006   TOENAIL EXCISION     TUBAL LIGATION     TYMPANOSTOMY TUBE PLACEMENT       Social History:   reports that she quit smoking about 55 years ago. Her smoking use included cigarettes. She started smoking about 60 years ago. She has a 1.3 pack-year smoking history. She has never used smokeless tobacco. She reports that she does not drink alcohol and does not use drugs.   Family History:  Her family history includes Allergic rhinitis in her mother; Heart disease in her father, maternal grandfather, and mother; Rectal cancer in her maternal grandfather; Stomach cancer in her maternal grandmother. There is no history of Colon cancer, Breast cancer, Colon polyps, or Esophageal cancer.   Allergies Allergies  Allergen Reactions   Olmesartan Medoxomil     REACTION: headache     Home Medications  Prior to Admission medications   Medication Sig Start Date End Date Taking? Authorizing Provider  alendronate (FOSAMAX) 70 MG tablet Take 70 mg by mouth once a week. 12/06/22   [provider]  AMBULATORY NON FORMULARY MEDICATION Left wrist brace 11/06/21   Richardean Sale, DO  aspirin EC 81 MG tablet Take 81 mg by mouth daily. Swallow whole.    [provider]  Blood Glucose Monitoring Suppl (ONETOUCH VERIO FLEX SYSTEM) w/Device KIT USE TO TEST BLOOD SUGAR DAILY AS DIRECTED 06/19/22   Shamleffer, Konrad Dolores, MD  budesonide-formoterol (SYMBICORT) 160-4.5 MCG/ACT inhaler Inhale 2 puffs into the lungs 2 (two) times daily. 05/26/23   Kalman Shan, MD  calcium elemental as carbonate (ANTACID MAXIMUM) 400 MG chewable tablet Chew 1,000 mg by mouth as needed for heartburn.    [provider]  carvedilol (COREG) 12.5 MG tablet TAKE 1 TABLET BY MOUTH TWO TIMES A DAY WITH A MEAL 12/25/21   Bhagat, [place], PA  celecoxib (CELEBREX) 200 MG capsule  TAKE 1 CAPSULE BY MOUTH DAILY 05/24/23   Kirtland Bouchard, PA-C  cholecalciferol (VITAMIN D3) 25 MCG (1000 UNIT) tablet Take 1,000 Units by mouth daily.    [provider]  DULoxetine (CYMBALTA) 30 MG capsule Take 30 mg by mouth daily.    [provider]  esomeprazole (NEXIUM) 40 MG capsule Take 1 capsule (40 mg total) by mouth 2 (two) times daily before a meal. 04/26/23   Olive Bass, FNP  famotidine (PEPCID) 20 MG tablet Take 1 tablet (20 mg total) by mouth at bedtime. 04/21/22   Meriam Sprague, MD  fluticasone (FLONASE) 50 MCG/ACT nasal spray Place 1 spray into both nostrils daily. Begin by using 2 sprays in each nare daily for 3 to 5 days, then decrease to 1 spray in each nare daily. 12/25/22   Theadora Rama Scales, PA-C  gabapentin (NEURONTIN) 100 MG capsule Take 1 capsule (100 mg total) by mouth at bedtime. 08/31/22   Shamleffer, Konrad Dolores, MD  glucose blood (  ONETOUCH VERIO) test strip USE TO CHECK BLOOD SUGAE TWO TIMES A DAY 02/04/22   Shamleffer, Konrad Dolores, MD  hydrocortisone 2.5 % cream Apply topically 2 (two) times daily. 11/24/22   Olive Bass, FNP  levocetirizine (XYZAL) 5 MG tablet Take 1 tablet (5 mg total) by mouth every evening. 06/03/23 11/30/23  Marcelyn Bruins, MD  lidocaine (LIDODERM) 5 % Place 1 patch onto the skin every 12 (twelve) hours. Remove & Discard patch within 12 hours or as directed by MD 06/01/23   Richardean Sale, DO  montelukast (SINGULAIR) 10 MG tablet Take 1 tablet (10 mg total) by mouth daily. Patient not taking: Reported on 06/01/2023 04/21/22   Meriam Sprague, MD  Olopatadine HCl 0.2 % SOLN Apply 1 drop to eye daily as needed. 05/05/23   Marcelyn Bruins, MD  Olopatadine-Mometasone Cristal Generous) [phone] MCG/ACT SUSP Place 2 sprays into the nose in the morning and at bedtime. 05/05/23   Marcelyn Bruins, MD  Pirfenidone (ESBRIET) 801 MG TABS Take 1 tablet (801 mg total) by mouth 3  (three) times daily with meals. 04/27/23   Kalman Shan, MD  rosuvastatin (CRESTOR) 20 MG tablet TAKE 1 TABLET BY MOUTH DAILY 02/05/23   Alver Sorrow, NP  Semaglutide,0.25 or 0.5MG /DOS, 2 MG/3ML SOPN Inject 0.5 mg into the skin once a week. 08/31/22   Shamleffer, Konrad Dolores, MD  Turmeric (QC TUMERIC COMPLEX) 500 MG CAPS Take by mouth.    [provider]  valsartan (DIOVAN) 160 MG tablet Take 1 tablet (160 mg total) by mouth daily. 09/17/22   Meriam Sprague, MD     Dirk Dress, NP Pulmonary/Critical Care Medicine  06/16/2023  11:13 AM   See Loretha Stapler for personal pager PCCM on call pager [phone] until 7pm. Please call Elink 7p-7a. [phone]

## 2023-06-16 NOTE — H&P (Signed)
[facility] History and Physical  [name] NFA:[id number] DOB: [birth year] DOA: 06/15/2023 PCP: Olive Bass, FNP  Presented from: Home Chief Complaint: Shortness of breath  History of Present Illness: Amy Escobar is a 77 y.o. female with PMH significant for DM2, HTN, HLD, nonobstructive CAD PVCs, ILD/IPF, moderate pulmonary hypertension, GERD, gout, arthritis, anxiety. Patient follows up with pulmonologist Dr. Marchelle Gearing.  She was recently started on Esbriet.  She uses Symbicort, not on home oxygen.  Does not have rescue inhaler at home. 12/16, patient called pulmonologist office with complaint of dyspnea on minimal exertion associated with cough, more than usual clear mucus.  She was getting out of breath even on getting off her couch.  Unable to check O2 sat at home.   With worsening symptoms, patient was sent to [facility] ED on 12/17  In the ED, patient was afebrile, heart rate 90s O2 sat down in the 80s on room air, required 2 L supplemental cannula Labs with CBC unremarkable, BMP mostly unremarkable except sodium 132 Troponin and BNP normal EKG with normal sinus rhythm at 94 bpm, no ST-T wave changes, QTc prolonged at 501 ms Respiratory virus panel normal CT angio chest 1. Negative for acute pulmonary embolism. 2. Pulmonary findings are consistent with multifocal pneumonia superimposed on a background of interstitial lung disease. 3. New mediastinal and hilar lymphadenopathy, likely reactive.  Patient was given IV Rocephin, IV azithromycin TRH consulted for in-hospital management  I received this patient as a carryover admission from last night At the time of my evaluation, patient was propped up in bed.  Not in distress at rest.  On 2 L oxygen nasal cannula. Alert, awake, oriented x 3 and able to confirm the details of the history as below Her daughter was on the phone.   Review of Systems:  All systems were reviewed and were negative unless otherwise  mentioned in the HPI   Past medical history: Past Medical History:  Diagnosis Date   Alkaline phosphatase deficiency    w/u Ne   Allergic rhinitis    Allergy    Anxiety    Arthritis    Cataract    BILATERAL-REMOVED   DM2 (diabetes mellitus, type 2) (HCC)    GERD (gastroesophageal reflux disease)    Gout    Hemorrhoids    Hyperlipidemia    Hypertension    Interstitial lung disease (HCC)    Mild pulmonary hypertension (HCC)    NSVT (nonsustained ventricular tachycardia) (HCC)    Osteoporosis    PVC's (premature ventricular contractions)    Thyroid nodule    small   Tubular adenoma of colon 2017   Urticaria    Uterine fibroid     Past surgical history: Past Surgical History:  Procedure Laterality Date   CATARACT EXTRACTION Bilateral 2016   COLONOSCOPY  2007   echocardiogram (other)  01/16/2002   POLYPECTOMY     removed tumors from foot nerves  04/1999   stress cardiolite  02/12/2006   TOENAIL EXCISION     TUBAL LIGATION     TYMPANOSTOMY TUBE PLACEMENT      Social History:  reports that she quit smoking about 55 years ago. Her smoking use included cigarettes. She started smoking about 60 years ago. She has a 1.3 pack-year smoking history. She has never used smokeless tobacco. She reports that she does not drink alcohol and does not use drugs.  Allergies:  Allergies  Allergen Reactions   Benicar [Olmesartan] Other (See Comments)  Headache    Benicar [olmesartan]   Family history:  Family History  Problem Relation Age of Onset   Heart disease Mother    Allergic rhinitis Mother    Heart disease Father    Stomach cancer Maternal Grandmother    Heart disease Maternal Grandfather    Rectal cancer Maternal Grandfather    Colon cancer Neg Hx    Breast cancer Neg Hx    Colon polyps Neg Hx    Esophageal cancer Neg Hx      Physical Exam: Vitals:   06/16/23 0900 06/16/23 0914 06/16/23 1136 06/16/23 1209  BP:    127/61  Pulse: 84 82 80 81  Resp:    (!)  21  Temp:    97.8 F (36.6 C)  TempSrc:    Oral  SpO2:  98% 97% 96%  Height:       Wt Readings from Last 3 Encounters:  06/01/23 69.6 kg  05/17/23 71.3 kg  05/05/23 69.6 kg   Body mass index is 26.33 kg/m.  General exam: Pleasant, elderly African-American female.  Not in distress at rest Skin: No rashes, lesions or ulcers. HEENT: Atraumatic, normocephalic, no obvious bleeding Lungs: Clear to auscultation bilaterally CVS: Regular rate and rhythm, no murmur GI/Abd soft, nontender, nondistended, bowel sound present CNS: Alert, awake, oriented x 3 Psychiatry: Mood appropriate Extremities: No pedal edema, no calf tenderness   ----------------------------------------------------------------------------------------------------------------------------------------- ----------------------------------------------------------------------------------------------------------------------------------------- -----------------------------------------------------------------------------------------------------------------------------------------  Assessment/Plan: Principal Problem:   Acute respiratory failure with hypoxia (HCC)  Multifocal pneumonia  Presented with progressive dyspnea, cough in the setting of ILD/IPF CT imaging with multifocal pneumonia in the background interstitial lung disease.  I am not sure if the infiltrates are of infectious etiology or inflammatory.  She has no fever, WBC count normal.  Procalcitonin level not elevated Given IV Rocephin and azithromycin in the ED For now, I will continue IV Rocephin.  QTc slightly prolonged at five 1 ms.  Switch to oral doxycycline May benefit from steroids.  I will start on Solu-Medrol 40 mg twice daily. Pulmonary consultation called. Recent Labs  Lab 06/15/23 1352 06/16/23 0417  WBC 8.8  --   PROCALCITON  --  <0.10   Interstitial lung disease Idiopathic pulmonary fibrosis Moderate pulmonary hypertension She follows up with  pulmonologist Dr. Marchelle Gearing. PTA meds-was recently on a course of Esbriet which she has not been on for last 1 week. Continue Advair, Flonase  Acute respiratory failure with hypoxia Apparently she was not requiring supplemental oxygen at home so far. O2 sat in 80s in the ED requiring 2 L oxygen. Wean down as tolerated  Type 2 diabetes mellitus Diabetic neuropathy A1c 5.9 in September 2024 PTA meds-Ozempic weekly Blood sugar level may rise up with the steroids.  Start SSI/Accu-Cheks Continue gabapentin 100 mg nightly Recent Labs  Lab 06/16/23 0252 06/16/23 1211  GLUCAP 154* 109*   Hypertension PTA meds- Coreg 12.5 mg twice daily, valsartan 160 mg daily Resume Coreg.  Substitute valsartan with irbesartan  Nonobstructive CAD HLD PTA meds- aspirin 81 mg daily, Crestor 20 mg daily Continue the same.  GERD PPI to continue  Mobility: Encourage ambulation  Goals of care:   Code Status: Full Code    DVT prophylaxis:  enoxaparin (LOVENOX) injection 40 mg Start: 06/16/23 0915   Antimicrobials: IV Rocephin and doxycycline Fluid: None Consultants: Pulmonology Family Communication: Patient's daughter [name] was on the phone  Dispo: The patient is from: Home              Anticipated  d/c is to: Likely Home in 1 to 2 days  Diet: Diet Order             Diet heart healthy/carb modified Fluid consistency: Thin  Diet effective now                    ------------------------------------------------------------------------------------- Severity of Illness: The appropriate patient status for this patient is OBSERVATION. Observation status is judged to be reasonable and necessary in order to provide the required intensity of service to ensure the patient's safety. The patient's presenting symptoms, physical exam findings, and initial radiographic and laboratory data in the context of their medical condition is felt to place them at decreased risk for further clinical  deterioration. Furthermore, it is anticipated that the patient will be medically stable for discharge from the hospital within 2 midnights of admission.  -------------------------------------------------------------------------------------   Home Meds: Prior to Admission medications   Medication Sig Start Date End Date Taking? Authorizing Provider  alendronate (FOSAMAX) 70 MG tablet Take 70 mg by mouth once a week. 12/06/22   [provider]  AMBULATORY NON FORMULARY MEDICATION Left wrist brace 11/06/21   Richardean Sale, DO  aspirin EC 81 MG tablet Take 81 mg by mouth daily. Swallow whole.    [provider]  Blood Glucose Monitoring Suppl (ONETOUCH VERIO FLEX SYSTEM) w/Device KIT USE TO TEST BLOOD SUGAR DAILY AS DIRECTED 06/19/22   Shamleffer, Konrad Dolores, MD  budesonide-formoterol (SYMBICORT) 160-4.5 MCG/ACT inhaler Inhale 2 puffs into the lungs 2 (two) times daily. 05/26/23   Kalman Shan, MD  calcium elemental as carbonate (ANTACID MAXIMUM) 400 MG chewable tablet Chew 1,000 mg by mouth as needed for heartburn.    [provider]  carvedilol (COREG) 12.5 MG tablet TAKE 1 TABLET BY MOUTH TWO TIMES A DAY WITH A MEAL 12/25/21   Bhagat, [place], PA  celecoxib (CELEBREX) 200 MG capsule TAKE 1 CAPSULE BY MOUTH DAILY 05/24/23   Kirtland Bouchard, PA-C  cholecalciferol (VITAMIN D3) 25 MCG (1000 UNIT) tablet Take 1,000 Units by mouth daily.    [provider]  DULoxetine (CYMBALTA) 30 MG capsule Take 30 mg by mouth daily.    [provider]  esomeprazole (NEXIUM) 40 MG capsule Take 1 capsule (40 mg total) by mouth 2 (two) times daily before a meal. 04/26/23   Olive Bass, FNP  famotidine (PEPCID) 20 MG tablet Take 1 tablet (20 mg total) by mouth at bedtime. 04/21/22   Meriam Sprague, MD  fluticasone (FLONASE) 50 MCG/ACT nasal spray Place 1 spray into both nostrils daily. Begin by using 2 sprays in each nare daily for 3 to 5 days,  then decrease to 1 spray in each nare daily. 12/25/22   Theadora Rama Scales, PA-C  gabapentin (NEURONTIN) 100 MG capsule Take 1 capsule (100 mg total) by mouth at bedtime. 08/31/22   Shamleffer, Konrad Dolores, MD  glucose blood (ONETOUCH VERIO) test strip USE TO CHECK BLOOD SUGAE TWO TIMES A DAY 02/04/22   Shamleffer, Konrad Dolores, MD  hydrocortisone 2.5 % cream Apply topically 2 (two) times daily. 11/24/22   Olive Bass, FNP  levocetirizine (XYZAL) 5 MG tablet Take 1 tablet (5 mg total) by mouth every evening. 06/03/23 11/30/23  Marcelyn Bruins, MD  lidocaine (LIDODERM) 5 % Place 1 patch onto the skin every 12 (twelve) hours. Remove & Discard patch within 12 hours or as directed by MD 06/01/23   Richardean Sale, DO  montelukast (SINGULAIR) 10 MG tablet Take  1 tablet (10 mg total) by mouth daily. Patient not taking: Reported on 06/01/2023 04/21/22   Meriam Sprague, MD  Olopatadine HCl 0.2 % SOLN Apply 1 drop to eye daily as needed. 05/05/23   Marcelyn Bruins, MD  Olopatadine-Mometasone Cristal Generous) [phone] MCG/ACT SUSP Place 2 sprays into the nose in the morning and at bedtime. 05/05/23   Marcelyn Bruins, MD  Pirfenidone (ESBRIET) 801 MG TABS Take 1 tablet (801 mg total) by mouth 3 (three) times daily with meals. 04/27/23   Kalman Shan, MD  rosuvastatin (CRESTOR) 20 MG tablet TAKE 1 TABLET BY MOUTH DAILY 02/05/23   Alver Sorrow, NP  Semaglutide,0.25 or 0.5MG /DOS, 2 MG/3ML SOPN Inject 0.5 mg into the skin once a week. 08/31/22   Shamleffer, Konrad Dolores, MD  Turmeric (QC TUMERIC COMPLEX) 500 MG CAPS Take by mouth.    [provider]  valsartan (DIOVAN) 160 MG tablet Take 1 tablet (160 mg total) by mouth daily. 09/17/22   Meriam Sprague, MD    Labs on Admission:   CBC: Recent Labs  Lab 06/15/23 1352  WBC 8.8  HGB 12.1  HCT 37.9  MCV 83.8  PLT 368    Basic Metabolic Panel: Recent Labs  Lab 06/15/23 1352  NA 132*  K 4.2   CL 99  CO2 25  GLUCOSE 101*  BUN 11  CREATININE 0.84  CALCIUM 9.2    Liver Function Tests: No results for input(s): "AST", "ALT", "ALKPHOS", "BILITOT", "PROT", "ALBUMIN" in the last 168 hours. No results for input(s): "LIPASE", "AMYLASE" in the last 168 hours. No results for input(s): "AMMONIA" in the last 168 hours.  Cardiac Enzymes: No results for input(s): "CKTOTAL", "CKMB", "CKMBINDEX", "TROPONINI" in the last 168 hours.  BNP (last 3 results) Recent Labs    06/15/23 1352  BNP 76.5    ProBNP (last 3 results) No results for input(s): "PROBNP" in the last 8760 hours.  CBG: Recent Labs  Lab 06/16/23 0252 06/16/23 1211  GLUCAP 154* 109*    Lipase     Component Value Date/Time   LIPASE 31 11/04/2016 1220     Urinalysis    Component Value Date/Time   COLORURINE YELLOW 11/30/2018 1226   APPEARANCEUR CLEAR 11/30/2018 1226   LABSPEC 1.010 11/30/2018 1226   PHURINE 7.0 11/30/2018 1226   GLUCOSEU NEGATIVE 11/30/2018 1226   HGBUR NEGATIVE 11/30/2018 1226   HGBUR negative 07/30/2010 0823   BILIRUBINUR negative 09/04/2021 1019   BILIRUBINUR Negative 05/31/2020 1541   KETONESUR negative 09/04/2021 1019   KETONESUR NEGATIVE 11/30/2018 1226   PROTEINUR negative 09/04/2021 1019   PROTEINUR Negative 05/31/2020 1541   PROTEINUR NEGATIVE 03/09/2010 0913   UROBILINOGEN 0.2 09/04/2021 1019   UROBILINOGEN 0.2 11/30/2018 1226   NITRITE Negative 09/04/2021 1019   NITRITE Negative 05/31/2020 1541   NITRITE NEGATIVE 11/30/2018 1226   LEUKOCYTESUR Negative 09/04/2021 1019   LEUKOCYTESUR NEGATIVE 11/30/2018 1226     Drugs of Abuse  No results found for: "LABOPIA", "COCAINSCRNUR", "LABBENZ", "AMPHETMU", "THCU", "LABBARB"    Radiological Exams on Admission: CT Angio Chest PE W/Cm &/Or Wo Cm Result Date: 06/15/2023 CLINICAL DATA:  Cough and hypoxia EXAM: CT ANGIOGRAPHY CHEST WITH CONTRAST TECHNIQUE: Multidetector CT imaging of the chest was performed using the standard  protocol during bolus administration of intravenous contrast. Multiplanar CT image reconstructions and MIPs were obtained to evaluate the vascular anatomy. RADIATION DOSE REDUCTION: This exam was performed according to the departmental dose-optimization program which includes automated exposure control, adjustment of  the mA and/or kV according to patient size and/or use of iterative reconstruction technique. CONTRAST:  75mL OMNIPAQUE IOHEXOL 350 MG/ML SOLN COMPARISON:  Same-day radiograph and CT 01/29/2023 FINDINGS: Cardiovascular: Negative for acute pulmonary embolism. Normal caliber thoracic aorta. No pericardial effusion. Coronary artery and aortic atherosclerotic calcification. Mediastinum/Nodes: Trachea and esophagus are unremarkable. New mediastinal and hilar lymphadenopathy. For example a right hilar node measures 1.0 cm on series 5/image 109. A right paratracheal node measures 1.1 cm on series 5/image 69. Lungs/Pleura: Patchy bilateral ground-glass opacities and centrilobular micro nodules with more confluence airspace opacities in the lower lobes. Peripheral peribronchovascular reticular opacities compatible with interstitial lung disease. No pleural effusion or pneumothorax. Upper Abdomen: No acute abnormality. Musculoskeletal: No acute fracture. Review of the MIP images confirms the above findings. IMPRESSION: 1. Negative for acute pulmonary embolism. 2. Pulmonary findings are consistent with multifocal pneumonia superimposed on a background of interstitial lung disease. 3. New mediastinal and hilar lymphadenopathy, likely reactive. Aortic Atherosclerosis (ICD10-I70.0). Electronically Signed   By: Minerva Fester M.D.   On: 06/15/2023 21:12   DG Chest 2 View Result Date: 06/15/2023 CLINICAL DATA:  Shortness of breath.  Productive cough. EXAM: CHEST - 2 VIEW COMPARISON:  Chest radiographs 01/14/2023, 11/06/2021 05/06/2020, 04/05/2020 FINDINGS: Cardiac silhouette is at the upper limits of normal size.  Mediastinal contours are within limits. Moderate calcification within the aortic arch. Interval increase in now moderate diffuse bilateral interstitial thickening. This is superimposed on additional chronic mild moderate interstitial thickening at baseline suggesting interstitial lung disease. No acute buckle airspace opacity. No pleural effusion pneumothorax. Mild dextrocurvature of the midthoracic spine. Mild-to-moderate multilevel degenerative disc changes of the thoracic spine. IMPRESSION: 1. Interval increase in now moderate diffuse bilateral interstitial thickening. Findings may represent interstitial pulmonary edema or atypical infection superimposed on chronic interstitial lung disease. 2. Otherwise, no focal acute airspace opacity. Electronically Signed   By: Neita Garnet M.D.   On: 06/15/2023 16:02     Signed, Lorin Glass, MD [facility] 06/16/2023

## 2023-06-17 ENCOUNTER — Other Ambulatory Visit: Payer: Self-pay

## 2023-06-17 DIAGNOSIS — I272 Pulmonary hypertension, unspecified: Secondary | ICD-10-CM | POA: Diagnosis not present

## 2023-06-17 DIAGNOSIS — J849 Interstitial pulmonary disease, unspecified: Secondary | ICD-10-CM | POA: Diagnosis not present

## 2023-06-17 DIAGNOSIS — J84112 Idiopathic pulmonary fibrosis: Secondary | ICD-10-CM | POA: Diagnosis not present

## 2023-06-17 DIAGNOSIS — I10 Essential (primary) hypertension: Secondary | ICD-10-CM | POA: Diagnosis not present

## 2023-06-17 DIAGNOSIS — J9601 Acute respiratory failure with hypoxia: Secondary | ICD-10-CM | POA: Diagnosis not present

## 2023-06-17 LAB — GLUCOSE, CAPILLARY
Glucose-Capillary: 136 mg/dL — ABNORMAL HIGH (ref 70–99)
Glucose-Capillary: 152 mg/dL — ABNORMAL HIGH (ref 70–99)
Glucose-Capillary: 170 mg/dL — ABNORMAL HIGH (ref 70–99)
Glucose-Capillary: 138 mg/dL — ABNORMAL HIGH (ref 70–99)
Glucose-Capillary: 103 mg/dL — ABNORMAL HIGH (ref 70–99)

## 2023-06-17 LAB — BASIC METABOLIC PANEL
Anion gap: 8 (ref 5–15)
BUN: 13 mg/dL (ref 8–23)
CO2: 23 mmol/L (ref 22–32)
Calcium: 8.8 mg/dL — ABNORMAL LOW (ref 8.9–10.3)
Chloride: 104 mmol/L (ref 98–111)
Creatinine, Ser: 0.68 mg/dL (ref 0.44–1.00)
GFR, Estimated: >60 mL/min (ref >60)
Glucose, Bld: 139 mg/dL — ABNORMAL HIGH (ref 70–99)
Potassium: 4.6 mmol/L (ref 3.5–5.1)
Sodium: 135 mmol/L (ref 135–145)

## 2023-06-17 LAB — CBC
HCT: 36 % (ref 36.0–46.0)
Hemoglobin: 11.6 g/dL — ABNORMAL LOW (ref 12.0–15.0)
MCH: 27 pg (ref 26.0–34.0)
MCHC: 32.2 g/dL (ref 30.0–36.0)
MCV: 83.9 fL (ref 80.0–100.0)
Platelets: 372 10*3/uL (ref 150–400)
RBC: 4.29 MIL/uL (ref 3.87–5.11)
RDW: 14.6 % (ref 11.5–15.5)
WBC: 8.7 10*3/uL (ref 4.0–10.5)
nRBC: 0 % (ref 0.0–0.2)

## 2023-06-17 MED ORDER — SENNOSIDES-DOCUSATE SODIUM 8.6-50 MG PO TABS
1.0000 | ORAL_TABLET | Freq: Two times a day (BID) | ORAL | Status: DC
  Administered 2023-06-17 – 2023-06-19 (×4): 1 via ORAL
  Filled 2023-06-17 (×3): qty 1

## 2023-06-17 NOTE — Progress Notes (Signed)
PROGRESS NOTE        PATIENT DETAILS Name: Amy Escobar Age: 77 y.o. Sex: female Date of Birth: 04-02-1946 Admit Date: 06/15/2023 Admitting Physician Therisa Doyne, MD PIR:JJOACZ, Allyne Gee, FNP  Brief Summary: Patient is a 77 y.o.  female with history of IPF, moderate pulmonary hypertension, nonobstructive CAD, HTN, HLD, DM-2 who presented with shortness of breath-patient was found to have acute hypoxic respiratory failure either from IPF flare or multifocal PNA.  Significant events: 12/17>> admit to [facility].  Significant studies: 12/17>> CT angio chest: No PE, multifocal PNA superimposed on background of interstitial lung disease  Significant microbiology data: 12/17>> COVID/influenza/RSV PCR: 12/18>> respiratory virus panel: Not detected  Procedures: None  Consults: None  Subjective: Feels better-stable on just 1-2 L of oxygen.  Objective: Vitals: Blood pressure 123/87, pulse 92, temperature 98.2 F (36.8 C), temperature source Oral, resp. rate 15, height 5\' 4"  (1.626 m), SpO2 92%.   Exam: Gen Exam:Alert awake-not in any distress HEENT:atraumatic, normocephalic Chest: Moving air well-inspiratory rales bilaterally. CVS:S1S2 regular Abdomen:soft non tender, non distended Extremities:no edema Neurology: Non focal Skin: no rash  Pertinent Labs/Radiology:    Latest Ref Rng & Units 06/17/2023    4:16 AM 06/15/2023    1:52 PM 06/01/2023    9:28 AM  CBC  WBC 4.0 - 10.5 K/uL 8.7  8.8  7.9   Hemoglobin 12.0 - 15.0 g/dL 66.0  63.0  16.0   Hematocrit 36.0 - 46.0 % 36.0  37.9  37.6   Platelets 150 - 400 K/uL 372  368  244.0     Lab Results  Component Value Date   NA 135 06/17/2023   K 4.6 06/17/2023   CL 104 06/17/2023   CO2 23 06/17/2023      Assessment/Plan: Acute hypoxic respiratory failure-secondary to IPF flare versus multifocal pneumonia Overall improved Suspected IPF flare-procalcitonin negative-low suspicion for  PNA Continue IV steroids Empiric antibiotics x 5 days total Volume status stable-does not require diuretics Slowly titrate down FiO2 Mobilize with nursing staff/mobility tech.  History of idiopathic pulmonary fibrosis Moderate pulm hypertension Esbriet on hold Follow with Dr. Marchelle Gearing postdischarge  HTN Continue Avapro  HLD Statin  DM-2 (A1c 5.9 on 9/9) CBGs able-SSI  Recent Labs    06/16/23 1553 06/16/23 2206 06/17/23 0754  GLUCAP 143* 134* 136*    GERD PPI  Underweight: Estimated body mass index is 26.33 kg/m as calculated from the following:   Height as of this encounter: 5\' 4"  (1.626 m).   Weight as of 06/01/23: 69.6 kg.   Code status:   Code Status: Full Code   DVT Prophylaxis: enoxaparin (LOVENOX) injection 40 mg Start: 06/16/23 0915   Family Communication: None at bedside   Disposition Plan: Status is: Inpatient Remains inpatient appropriate because: Severity of illness   Planned Discharge Destination:Home   Diet: Diet Order             Diet heart healthy/carb modified Fluid consistency: Thin  Diet effective now                     Antimicrobial agents: Anti-infectives (From admission, onward)    Start     Dose/Rate Route Frequency Ordered Stop   06/16/23 1000  cefTRIAXone (ROCEPHIN) 1 g in sodium chloride 0.9 % 100 mL IVPB        1 g  200 mL/hr over 30 Minutes Intravenous Every 24 hours 06/16/23 0836     06/16/23 1000  doxycycline (VIBRA-TABS) tablet 100 mg        100 mg Oral Every 12 hours 06/16/23 0836     06/15/23 2130  cefTRIAXone (ROCEPHIN) 1 g in sodium chloride 0.9 % 100 mL IVPB        1 g 200 mL/hr over 30 Minutes Intravenous  Once 06/15/23 2117 06/15/23 2214   06/15/23 2130  azithromycin (ZITHROMAX) 500 mg in sodium chloride 0.9 % 250 mL IVPB        500 mg 250 mL/hr over 60 Minutes Intravenous  Once 06/15/23 2117 06/16/23 7829        MEDICATIONS: Scheduled Meds:  aspirin EC  81 mg Oral Daily   doxycycline  100  mg Oral Q12H   enoxaparin (LOVENOX) injection  40 mg Subcutaneous Q24H   fesoterodine  4 mg Oral Daily   gabapentin  100 mg Oral QHS   insulin aspart  0-5 Units Subcutaneous QHS   insulin aspart  0-9 Units Subcutaneous TID WC   irbesartan  150 mg Oral Daily   methylPREDNISolone (SOLU-MEDROL) injection  40 mg Intravenous Q12H   mometasone-formoterol  2 puff Inhalation BID   pantoprazole  40 mg Oral Daily   rosuvastatin  20 mg Oral Daily   Continuous Infusions:  cefTRIAXone (ROCEPHIN)  IV 1 g (06/17/23 0909)   PRN Meds:.acetaminophen **OR** acetaminophen, albuterol, bisacodyl, hydrALAZINE, oxyCODONE, polyethylene glycol   I have personally reviewed following labs and imaging studies  LABORATORY DATA: CBC: Recent Labs  Lab 06/15/23 1352 06/17/23 0416  WBC 8.8 8.7  HGB 12.1 11.6*  HCT 37.9 36.0  MCV 83.8 83.9  PLT 368 372    Basic Metabolic Panel: Recent Labs  Lab 06/15/23 1352 06/17/23 0416  NA 132* 135  K 4.2 4.6  CL 99 104  CO2 25 23  GLUCOSE 101* 139*  BUN 11 13  CREATININE 0.84 0.68  CALCIUM 9.2 8.8*    GFR: CrCl cannot be calculated (Unknown ideal weight.).  Liver Function Tests: No results for input(s): "AST", "ALT", "ALKPHOS", "BILITOT", "PROT", "ALBUMIN" in the last 168 hours. No results for input(s): "LIPASE", "AMYLASE" in the last 168 hours. No results for input(s): "AMMONIA" in the last 168 hours.  Coagulation Profile: No results for input(s): "INR", "PROTIME" in the last 168 hours.  Cardiac Enzymes: No results for input(s): "CKTOTAL", "CKMB", "CKMBINDEX", "TROPONINI" in the last 168 hours.  BNP (last 3 results) No results for input(s): "PROBNP" in the last 8760 hours.  Lipid Profile: No results for input(s): "CHOL", "HDL", "LDLCALC", "TRIG", "CHOLHDL", "LDLDIRECT" in the last 72 hours.  Thyroid Function Tests: No results for input(s): "TSH", "T4TOTAL", "FREET4", "T3FREE", "THYROIDAB" in the last 72 hours.  Anemia Panel: No results for  input(s): "VITAMINB12", "FOLATE", "FERRITIN", "TIBC", "IRON", "RETICCTPCT" in the last 72 hours.  Urine analysis:    Component Value Date/Time   COLORURINE YELLOW 11/30/2018 1226   APPEARANCEUR CLEAR 11/30/2018 1226   LABSPEC 1.010 11/30/2018 1226   PHURINE 7.0 11/30/2018 1226   GLUCOSEU NEGATIVE 11/30/2018 1226   HGBUR NEGATIVE 11/30/2018 1226   HGBUR negative 07/30/2010 0823   BILIRUBINUR negative 09/04/2021 1019   BILIRUBINUR Negative 05/31/2020 1541   KETONESUR negative 09/04/2021 1019   KETONESUR NEGATIVE 11/30/2018 1226   PROTEINUR negative 09/04/2021 1019   PROTEINUR Negative 05/31/2020 1541   PROTEINUR NEGATIVE 03/09/2010 0913   UROBILINOGEN 0.2 09/04/2021 1019   UROBILINOGEN 0.2 11/30/2018 1226  NITRITE Negative 09/04/2021 1019   NITRITE Negative 05/31/2020 1541   NITRITE NEGATIVE 11/30/2018 1226   LEUKOCYTESUR Negative 09/04/2021 1019   LEUKOCYTESUR NEGATIVE 11/30/2018 1226    Sepsis Labs: Lactic Acid, Venous No results found for: "LATICACIDVEN"  MICROBIOLOGY: Recent Results (from the past 240 hours)  Resp panel by RT-PCR (RSV, Flu A&B, Covid) Anterior Nasal Swab     Status: None   Collection Time: 06/15/23  6:49 PM   Specimen: Anterior Nasal Swab  Result Value Ref Range Status   SARS Coronavirus 2 by RT PCR NEGATIVE NEGATIVE Final    Comment: (NOTE) SARS-CoV-2 target nucleic acids are NOT DETECTED.  The SARS-CoV-2 RNA is generally detectable in upper respiratory specimens during the acute phase of infection. The lowest concentration of SARS-CoV-2 viral copies this assay can detect is 138 copies/mL. A negative result does not preclude SARS-Cov-2 infection and should not be used as the sole basis for treatment or other patient management decisions. A negative result may occur with  improper specimen collection/handling, submission of specimen other than nasopharyngeal swab, presence of viral mutation(s) within the areas targeted by this assay, and  inadequate number of viral copies(<138 copies/mL). A negative result must be combined with clinical observations, patient history, and epidemiological information. The expected result is Negative.  Fact Sheet for Patients:  BloggerCourse.com  Fact Sheet for Healthcare Providers:  SeriousBroker.it  This test is no t yet approved or cleared by the [place] FDA and  has been authorized for detection and/or diagnosis of SARS-CoV-2 by FDA under an Emergency Use Authorization (EUA). This EUA will remain  in effect (meaning this test can be used) for the duration of the COVID-19 declaration under Section 564(b)(1) of the Act, 21 U.S.C.section 360bbb-3(b)(1), unless the authorization is terminated  or revoked sooner.       Influenza A by PCR NEGATIVE NEGATIVE Final   Influenza B by PCR NEGATIVE NEGATIVE Final    Comment: (NOTE) The Xpert Xpress SARS-CoV-2/FLU/RSV plus assay is intended as an aid in the diagnosis of influenza from Nasopharyngeal swab specimens and should not be used as a sole basis for treatment. Nasal washings and aspirates are unacceptable for Xpert Xpress SARS-CoV-2/FLU/RSV testing.  Fact Sheet for Patients: BloggerCourse.com  Fact Sheet for Healthcare Providers: SeriousBroker.it  This test is not yet approved or cleared by the [place] FDA and has been authorized for detection and/or diagnosis of SARS-CoV-2 by FDA under an Emergency Use Authorization (EUA). This EUA will remain in effect (meaning this test can be used) for the duration of the COVID-19 declaration under Section 564(b)(1) of the Act, 21 U.S.C. section 360bbb-3(b)(1), unless the authorization is terminated or revoked.     Resp Syncytial Virus by PCR NEGATIVE NEGATIVE Final    Comment: (NOTE) Fact Sheet for Patients: BloggerCourse.com  Fact Sheet for Healthcare  Providers: SeriousBroker.it  This test is not yet approved or cleared by the [place] FDA and has been authorized for detection and/or diagnosis of SARS-CoV-2 by FDA under an Emergency Use Authorization (EUA). This EUA will remain in effect (meaning this test can be used) for the duration of the COVID-19 declaration under Section 564(b)(1) of the Act, 21 U.S.C. section 360bbb-3(b)(1), unless the authorization is terminated or revoked.  Performed at [facility], [address] [place], [place] [address]   Respiratory (~20 pathogens) panel by PCR     Status: None   Collection Time: 06/16/23 11:01 AM   Specimen: Nasopharyngeal Swab; Respiratory  Result Value Ref  Range Status   Adenovirus NOT DETECTED NOT DETECTED Final   Coronavirus 229E NOT DETECTED NOT DETECTED Final    Comment: (NOTE) The Coronavirus on the Respiratory Panel, DOES NOT test for the novel  Coronavirus (2019 nCoV)    Coronavirus HKU1 NOT DETECTED NOT DETECTED Final   Coronavirus NL63 NOT DETECTED NOT DETECTED Final   Coronavirus OC43 NOT DETECTED NOT DETECTED Final   Metapneumovirus NOT DETECTED NOT DETECTED Final   Rhinovirus / Enterovirus NOT DETECTED NOT DETECTED Final   Influenza A NOT DETECTED NOT DETECTED Final   Influenza B NOT DETECTED NOT DETECTED Final   Parainfluenza Virus 1 NOT DETECTED NOT DETECTED Final   Parainfluenza Virus 2 NOT DETECTED NOT DETECTED Final   Parainfluenza Virus 3 NOT DETECTED NOT DETECTED Final   Parainfluenza Virus 4 NOT DETECTED NOT DETECTED Final   Respiratory Syncytial Virus NOT DETECTED NOT DETECTED Final   Bordetella pertussis NOT DETECTED NOT DETECTED Final   Bordetella Parapertussis NOT DETECTED NOT DETECTED Final   Chlamydophila pneumoniae NOT DETECTED NOT DETECTED Final   Mycoplasma pneumoniae NOT DETECTED NOT DETECTED Final    Comment: Performed at [facility], [address] [place], [place] [address]     RADIOLOGY STUDIES/RESULTS: CT Angio Chest PE W/Cm &/Or Wo Cm Result Date: 06/15/2023 CLINICAL DATA:  Cough and hypoxia EXAM: CT ANGIOGRAPHY CHEST WITH CONTRAST TECHNIQUE: Multidetector CT imaging of the chest was performed using the standard protocol during bolus administration of intravenous contrast. Multiplanar CT image reconstructions and MIPs were obtained to evaluate the vascular anatomy. RADIATION DOSE REDUCTION: This exam was performed according to the departmental dose-optimization program which includes automated exposure control, adjustment of the mA and/or kV according to patient size and/or use of iterative reconstruction technique. CONTRAST:  75mL OMNIPAQUE IOHEXOL 350 MG/ML SOLN COMPARISON:  Same-day radiograph and CT 01/29/2023 FINDINGS: Cardiovascular: Negative for acute pulmonary embolism. Normal caliber thoracic aorta. No pericardial effusion. Coronary artery and aortic atherosclerotic calcification. Mediastinum/Nodes: Trachea and esophagus are unremarkable. New mediastinal and hilar lymphadenopathy. For example a right hilar node measures 1.0 cm on series 5/image 109. A right paratracheal node measures 1.1 cm on series 5/image 69. Lungs/Pleura: Patchy bilateral ground-glass opacities and centrilobular micro nodules with more confluence airspace opacities in the lower lobes. Peripheral peribronchovascular reticular opacities compatible with interstitial lung disease. No pleural effusion or pneumothorax. Upper Abdomen: No acute abnormality. Musculoskeletal: No acute fracture. Review of the MIP images confirms the above findings. IMPRESSION: 1. Negative for acute pulmonary embolism. 2. Pulmonary findings are consistent with multifocal pneumonia superimposed on a background of interstitial lung disease. 3. New mediastinal and hilar lymphadenopathy, likely reactive. Aortic Atherosclerosis (ICD10-I70.0). Electronically Signed   By: Minerva Fester M.D.   On: 06/15/2023 21:12   DG Chest 2  View Result Date: 06/15/2023 CLINICAL DATA:  Shortness of breath.  Productive cough. EXAM: CHEST - 2 VIEW COMPARISON:  Chest radiographs 01/14/2023, 11/06/2021 05/06/2020, 04/05/2020 FINDINGS: Cardiac silhouette is at the upper limits of normal size. Mediastinal contours are within limits. Moderate calcification within the aortic arch. Interval increase in now moderate diffuse bilateral interstitial thickening. This is superimposed on additional chronic mild moderate interstitial thickening at baseline suggesting interstitial lung disease. No acute buckle airspace opacity. No pleural effusion pneumothorax. Mild dextrocurvature of the midthoracic spine. Mild-to-moderate multilevel degenerative disc changes of the thoracic spine. IMPRESSION: 1. Interval increase in now moderate diffuse bilateral interstitial thickening. Findings may represent interstitial pulmonary edema or atypical infection superimposed on chronic interstitial lung disease. 2.  Otherwise, no focal acute airspace opacity. Electronically Signed   By: Neita Garnet M.D.   On: 06/15/2023 16:02     LOS: 1 day   Jeoffrey Massed, MD  [facility]    To contact the attending provider between 7A-7P or the covering provider during after hours 7P-7A, please log into the web site [email or web address] and access using universal [place] password for that web site. If you do not have the password, please call the hospital operator.  06/17/2023, 9:44 AM

## 2023-06-17 NOTE — Plan of Care (Signed)

## 2023-06-17 NOTE — Progress Notes (Addendum)
NAME:  Amy Escobar, MRN:  [id number], DOB:  [birth year], LOS: 1 ADMISSION DATE:  06/15/2023, CONSULTATION DATE:  06/16/23 REFERRING MD:  Pola Corn CHIEF COMPLAINT:  Dyspnea   History of Present Illness:  Amy Escobar is a 77 y.o. female who has a PMH as below including IPF with UIP pattern, followed by Dr. Marchelle Gearing. She was started on Esbriet 04/20/23 but did not tolerate it due to nausea and diarrhea; therefore, it was stopped 06/14/23. She is not normally on O2 at baseline.  On 12/18, she called the pulmonary office for dyspnea and hypoxia and was instructed to come to the ED.  In ED, she initially required 2L O2 and was later increased to 5L via [place]. She also endorsed cough with clear mucus production. She had CTA chest that was negative for PE but showed multifocal PNA along with known IPF and likely reactive lymphadenopathy. She was admitted and started on CAP coverage.  Due to new O2 requirement and being a pt of Dr. Jane Canary, PCCM called to see in consultation.  Pertinent  Medical History:  has FIBROIDS, UTERUS; THYROID NODULE, LEFT; HYPERCHOLESTEROLEMIA; Gout; ANXIETY; CARPAL TUNNEL SYNDROME, BILATERAL; Essential hypertension; Perennial and seasonal allergic rhinitis; GERD; Disorder of liver; Low back pain; FOOT PAIN, BILATERAL; Osteoporosis; FATIGUE; Headache; Cough; ALKALINE PHOSPHATASE, ELEVATED; TOBACCO USE, QUIT; PELVIC PAIN, LEFT; Encounter for long-term (current) use of other medications; Painful respiration; Hip pain, left; Dysphagia; DOE (dyspnea on exertion); Pulmonary hypertension (HCC); UTI (urinary tract infection); Right knee pain; Gastrocnemius tear; Acute meniscal tear of knee; Primary localized osteoarthrosis, lower leg; Wellness examination; Discomfort in chest; DM type 2, controlled, with complication (HCC); Chest pain; Knee MCL sprain; Degenerative arthritis of knee, bilateral; Neck pain; Urinary incontinence; URI (upper respiratory infection); Hypertension; Mild  pulmonary hypertension (HCC); NSVT (nonsustained ventricular tachycardia) (HCC); PVC's (premature ventricular contractions); Trigger point of shoulder region, left; Greater trochanteric bursitis of both hips; Upper airway cough syndrome; Suspected COVID-19 virus infection; Unspecified inflammatory spondylopathy, cervical region [facility]); Claudication [facility]); Morbid obesity (HCC); AC (acromioclavicular) arthritis; 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 edema associated with type 2 diabetes mellitus (HCC); Lattice degeneration of both retinas; Diabetic neuropathy (HCC); Hav (hallux abducto valgus), unspecified laterality; Chronic arthropathy; Foot pain, bilateral; Osteoarthritis of right AC (acromioclavicular) joint; Osteoarthritis of right glenohumeral joint; Type 2 diabetes mellitus with diabetic polyneuropathy, without long-term current use of insulin (HCC); 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 without macular edema, without long-term current use of insulin (HCC); Diabetes mellitus (HCC); Polyneuropathy associated with underlying disease (HCC); and Acute respiratory failure with hypoxia (HCC) on their problem list.  Significant Hospital Events: Including procedures, antibiotic start and stop dates in addition to other pertinent events   12/18 admit. CTA chest >> 1. Negative for acute pulmonary embolism. 2. Pulmonary findings are consistent with multifocal pneumonia superimposed on a background of interstitial lung disease. 3. New mediastinal and hilar lymphadenopathy, likely reactive. RVP >> NEG   Interim History / Subjective:  On 2L [place] up in bed eating lunch No distress  Objective:  Blood pressure 133/86, pulse 95, temperature 97.9 F (36.6 C), temperature source Oral, resp. rate 15, height 5\' 4"  (1.626 m), SpO2 97%.        Intake/Output Summary (Last 24 hours) at 06/17/2023 1310 Last data filed at 06/16/2023  1500 Gross per 24 hour  Intake 100 ml  Output --  Net 100 ml   There were no vitals filed for this visit.  Examination: General:  NAD HEENT: MM pink/moist Neuro: Aox3; MAE CV: s1s2, RRR, no m/r/g PULM:  dim clear BS bilaterally; [place] 2L GI: soft, bsx4 active  Extremities: warm/dry, no edema  Skin: no rashes or lesions   Afebrile and wbc 8.7 RVP negative  Labs/imaging personally reviewed:  CTA chest 12/18 > negative for PE but showed multifocal PNA along with known IPF and likely reactive lymphadenopathy.  Assessment & Plan:   Acute respiraotry failure w/ hypoxia Known IPF/UIP with probable flare 2/2 CAP. Plan: -cont [place] and wean for sats >92% -rvp negative -cont rocephin/doxy for cap ppx per primary -cont steroids -pulm toiletry: IS/Flutter -oob, pt, ot -holding esbriet while in hospital -cont dulera -outpt pulm f/u 07/05/23 with Buelah Manis in pulm office  Best practice (evaluated daily):  Per primary  Labs   CBC: Recent Labs  Lab 06/15/23 1352 06/17/23 0416  WBC 8.8 8.7  HGB 12.1 11.6*  HCT 37.9 36.0  MCV 83.8 83.9  PLT 368 372    Basic Metabolic Panel: Recent Labs  Lab 06/15/23 1352 06/17/23 0416  NA 132* 135  K 4.2 4.6  CL 99 104  CO2 25 23  GLUCOSE 101* 139*  BUN 11 13  CREATININE 0.84 0.68  CALCIUM 9.2 8.8*   GFR: CrCl cannot be calculated (Unknown ideal weight.). Recent Labs  Lab 06/15/23 1352 06/16/23 0417 06/17/23 0416  PROCALCITON  --  <0.10  --   WBC 8.8  --  8.7    Liver Function Tests: No results for input(s): "AST", "ALT", "ALKPHOS", "BILITOT", "PROT", "ALBUMIN" in the last 168 hours. No results for input(s): "LIPASE", "AMYLASE" in the last 168 hours. No results for input(s): "AMMONIA" in the last 168 hours.  ABG No results found for: "PHART", "PCO2ART", "PO2ART", "HCO3", "TCO2", "ACIDBASEDEF", "O2SAT"   Coagulation Profile: No results for input(s): "INR", "PROTIME" in the last 168 hours.  Cardiac Enzymes: No results  for input(s): "CKTOTAL", "CKMB", "CKMBINDEX", "TROPONINI" in the last 168 hours.  HbA1C: Hemoglobin-A1c  Date/Time Value Ref Range Status  07/31/2022 12:36 PM 5.7  Final   Hemoglobin A1C  Date/Time Value Ref Range Status  03/08/2023 10:01 AM 5.9 (A) 4.0 - 5.6 % Final   Hgb A1c MFr Bld  Date/Time Value Ref Range Status  11/19/2022 07:21 AM 5.6 4.8 - 5.6 % Final    Comment:             Prediabetes: 5.7 - 6.4          Diabetes: >6.4          Glycemic control for adults with diabetes: <7.0   03/03/2022 08:49 AM 6.8 (H) 4.6 - 6.5 % Final    Comment:    Glycemic Control Guidelines for People with Diabetes:Non Diabetic:  <6%Goal of Therapy: <7%Additional Action Suggested:  >8%     CBG: Recent Labs  Lab 06/16/23 1211 06/16/23 1553 06/16/23 2206 06/17/23 0754 06/17/23 1215  GLUCAP 109* 143* 134* 136* 152*    Review of Systems:   As per HPI - All other systems reviewed and were neg.    Past Medical History:  She,  has a past medical history of Alkaline phosphatase deficiency, Allergic rhinitis, Allergy, Anxiety, Arthritis, Cataract, DM2 (diabetes mellitus, type 2) (HCC), GERD (gastroesophageal reflux disease), Gout, Hemorrhoids, Hyperlipidemia, Hypertension, Interstitial lung disease (HCC), Mild pulmonary hypertension (HCC), NSVT (nonsustained ventricular tachycardia) (HCC), Osteoporosis, PVC's (premature ventricular contractions), Thyroid nodule, Tubular adenoma of colon (2017), Urticaria, and Uterine fibroid.   Surgical History:  Past Surgical History:  Procedure Laterality Date   CATARACT EXTRACTION Bilateral 2016   COLONOSCOPY  2007   echocardiogram (other)  01/16/2002   POLYPECTOMY     removed tumors from foot nerves  04/1999   stress cardiolite  02/12/2006   TOENAIL EXCISION     TUBAL LIGATION     TYMPANOSTOMY TUBE PLACEMENT       Social History:   reports that she quit smoking about 55 years ago. Her smoking use included cigarettes. She started smoking about 60  years ago. She has a 1.3 pack-year smoking history. She has never used smokeless tobacco. She reports that she does not drink alcohol and does not use drugs.   Family History:  Her family history includes Allergic rhinitis in her mother; Heart disease in her father, maternal grandfather, and mother; Rectal cancer in her maternal grandfather; Stomach cancer in her maternal grandmother. There is no history of Colon cancer, Breast cancer, Colon polyps, or Esophageal cancer.   Allergies Allergies  Allergen Reactions   Benicar [Olmesartan] Other (See Comments)    Headache      Home Medications  Prior to Admission medications   Medication Sig Start Date End Date Taking? Authorizing Provider  alendronate (FOSAMAX) 70 MG tablet Take 70 mg by mouth once a week. 12/06/22   [provider]  AMBULATORY NON FORMULARY MEDICATION Left wrist brace 11/06/21   Richardean Sale, DO  aspirin EC 81 MG tablet Take 81 mg by mouth daily. Swallow whole.    [provider]  Blood Glucose Monitoring Suppl (ONETOUCH VERIO FLEX SYSTEM) w/Device KIT USE TO TEST BLOOD SUGAR DAILY AS DIRECTED 06/19/22   Shamleffer, Konrad Dolores, MD  budesonide-formoterol (SYMBICORT) 160-4.5 MCG/ACT inhaler Inhale 2 puffs into the lungs 2 (two) times daily. 05/26/23   Kalman Shan, MD  calcium elemental as carbonate (ANTACID MAXIMUM) 400 MG chewable tablet Chew 1,000 mg by mouth as needed for heartburn.    [provider]  carvedilol (COREG) 12.5 MG tablet TAKE 1 TABLET BY MOUTH TWO TIMES A DAY WITH A MEAL 12/25/21   Bhagat, [place], PA  celecoxib (CELEBREX) 200 MG capsule TAKE 1 CAPSULE BY MOUTH DAILY 05/24/23   Kirtland Bouchard, PA-C  cholecalciferol (VITAMIN D3) 25 MCG (1000 UNIT) tablet Take 1,000 Units by mouth daily.    [provider]  DULoxetine (CYMBALTA) 30 MG capsule Take 30 mg by mouth daily.    [provider]  esomeprazole (NEXIUM) 40 MG capsule Take 1 capsule (40 mg total)  by mouth 2 (two) times daily before a meal. 04/26/23   Olive Bass, FNP  famotidine (PEPCID) 20 MG tablet Take 1 tablet (20 mg total) by mouth at bedtime. 04/21/22   Meriam Sprague, MD  fluticasone (FLONASE) 50 MCG/ACT nasal spray Place 1 spray into both nostrils daily. Begin by using 2 sprays in each nare daily for 3 to 5 days, then decrease to 1 spray in each nare daily. 12/25/22   Theadora Rama Scales, PA-C  gabapentin (NEURONTIN) 100 MG capsule Take 1 capsule (100 mg total) by mouth at bedtime. 08/31/22   Shamleffer, Konrad Dolores, MD  glucose blood (ONETOUCH VERIO) test strip USE TO CHECK BLOOD SUGAE TWO TIMES A DAY 02/04/22   Shamleffer, Konrad Dolores, MD  hydrocortisone 2.5 % cream Apply topically 2 (two) times daily. 11/24/22   Olive Bass, FNP  levocetirizine (XYZAL) 5 MG tablet Take 1 tablet (5 mg total) by mouth every evening. 06/03/23 11/30/23  Margo Aye  Elease Hashimoto, MD  lidocaine (LIDODERM) 5 % Place 1 patch onto the skin every 12 (twelve) hours. Remove & Discard patch within 12 hours or as directed by MD 06/01/23   Richardean Sale, DO  montelukast (SINGULAIR) 10 MG tablet Take 1 tablet (10 mg total) by mouth daily. Patient not taking: Reported on 06/01/2023 04/21/22   Meriam Sprague, MD  Olopatadine HCl 0.2 % SOLN Apply 1 drop to eye daily as needed. 05/05/23   Marcelyn Bruins, MD  Olopatadine-Mometasone Cristal Generous) [phone] MCG/ACT SUSP Place 2 sprays into the nose in the morning and at bedtime. 05/05/23   Marcelyn Bruins, MD  Pirfenidone (ESBRIET) 801 MG TABS Take 1 tablet (801 mg total) by mouth 3 (three) times daily with meals. 04/27/23   Kalman Shan, MD  rosuvastatin (CRESTOR) 20 MG tablet TAKE 1 TABLET BY MOUTH DAILY 02/05/23   Alver Sorrow, NP  Semaglutide,0.25 or 0.5MG /DOS, 2 MG/3ML SOPN Inject 0.5 mg into the skin once a week. 08/31/22   Shamleffer, Konrad Dolores, MD  Turmeric (QC TUMERIC COMPLEX) 500 MG CAPS Take by  mouth.    [provider]  valsartan (DIOVAN) 160 MG tablet Take 1 tablet (160 mg total) by mouth daily. 09/17/22   Meriam Sprague, MD     JD Anselm Lis [facility] 06/17/2023, 1:17 PM  Please see [email or web address] for pager details.  From 7A-7P if no response, please call [phone]. After hours, please call ELink [phone].

## 2023-06-17 NOTE — Progress Notes (Signed)
Mobility Specialist Progress Note;   06/17/23 1145  Mobility  Activity Ambulated with assistance in hallway  Level of Assistance Contact guard assist, steadying assist  Assistive Device Front wheel walker  Distance Ambulated (ft) 400 ft  Activity Response Tolerated well  Mobility Referral Yes  Mobility visit 1 Mobility  Mobility Specialist Start Time (ACUTE ONLY) 1145  Mobility Specialist Stop Time (ACUTE ONLY) 1205  Mobility Specialist Time Calculation (min) (ACUTE ONLY) 20 min   Pt agreeable to mobility. On 0.5LO2 upon arrival. Requested assistance to BR, void successful. Required MinG assistance during ambulation for safety. Ambulated on 0.5LO2, VSS throughout. No c/o during session. Pt returned back to [facility] for lunch w/ all needs met, on 0.5LO2.   Caesar Bookman Mobility Specialist Please contact via SecureChat or [employer] [phone]

## 2023-06-17 NOTE — Plan of Care (Signed)
°  Problem: Clinical Measurements: °Goal: Respiratory complications will improve °Outcome: Progressing °  °Problem: Activity: °Goal: Risk for activity intolerance will decrease °Outcome: Progressing °  °Problem: Coping: °Goal: Level of anxiety will decrease °Outcome: Progressing °  °Problem: Elimination: °Goal: Will not experience complications related to bowel motility °Outcome: Progressing °  °

## 2023-06-18 ENCOUNTER — Other Ambulatory Visit: Payer: Self-pay

## 2023-06-18 DIAGNOSIS — J849 Interstitial pulmonary disease, unspecified: Secondary | ICD-10-CM | POA: Diagnosis not present

## 2023-06-18 DIAGNOSIS — J9601 Acute respiratory failure with hypoxia: Secondary | ICD-10-CM | POA: Diagnosis not present

## 2023-06-18 LAB — C-REACTIVE PROTEIN: CRP: 0.7 mg/dL (ref <1.0)

## 2023-06-18 LAB — CBC
HCT: 37.8 % (ref 36.0–46.0)
Hemoglobin: 11.7 g/dL — ABNORMAL LOW (ref 12.0–15.0)
MCH: 27.1 pg (ref 26.0–34.0)
MCHC: 31 g/dL (ref 30.0–36.0)
MCV: 87.5 fL (ref 80.0–100.0)
Platelets: 331 10*3/uL (ref 150–400)
RBC: 4.32 MIL/uL (ref 3.87–5.11)
RDW: 14.8 % (ref 11.5–15.5)
WBC: 9.8 10*3/uL (ref 4.0–10.5)
nRBC: 0 % (ref 0.0–0.2)

## 2023-06-18 LAB — GLUCOSE, CAPILLARY
Glucose-Capillary: 142 mg/dL — ABNORMAL HIGH (ref 70–99)
Glucose-Capillary: 98 mg/dL (ref 70–99)
Glucose-Capillary: 123 mg/dL — ABNORMAL HIGH (ref 70–99)
Glucose-Capillary: 98 mg/dL (ref 70–99)

## 2023-06-18 LAB — BASIC METABOLIC PANEL
Anion gap: 12 (ref 5–15)
BUN: 15 mg/dL (ref 8–23)
CO2: 20 mmol/L — ABNORMAL LOW (ref 22–32)
Calcium: 9.3 mg/dL (ref 8.9–10.3)
Chloride: 103 mmol/L (ref 98–111)
Creatinine, Ser: 0.72 mg/dL (ref 0.44–1.00)
GFR, Estimated: >60 mL/min (ref >60)
Glucose, Bld: 101 mg/dL — ABNORMAL HIGH (ref 70–99)
Potassium: 4.3 mmol/L (ref 3.5–5.1)
Sodium: 135 mmol/L (ref 135–145)

## 2023-06-18 LAB — SEDIMENTATION RATE: Sed Rate: 66 mm/h — ABNORMAL HIGH (ref 0–22)

## 2023-06-18 NOTE — Plan of Care (Signed)

## 2023-06-18 NOTE — Evaluation (Signed)
Physical Therapy Evaluation Patient Details Name: Amy Escobar MRN: [id number] DOB: 18-Jan-1946 Today's Date: 06/18/2023  History of Present Illness  Patient is a 77 y.o. female who presented with shortness of breath-patient was found to have acute hypoxic respiratory failure either from IPF flare or multifocal PNA. PMH: IPF, moderate pulmonary hypertension, nonobstructive CAD, HTN, HLD, DM-2.  Clinical Impression  Pt presents with admitting diagnosis above. Pt today was able to ambulate in hallway with RW CGA. Initially trialed no AD however pt appeared to be a bit unsteady with some scissoring gait noted. SpO2 between 85-92% on RA. Pt reports no SOB. PTA pt reports she lives alone and was fully independent with no AD. Recommend OPPT for balance upon DC. PT will continue to follow.         If plan is discharge home, recommend the following: A little help with walking and/or transfers;A little help with bathing/dressing/bathroom;Assistance with cooking/housework;Assist for transportation;Help with stairs or ramp for entrance   Can travel by private vehicle        Equipment Recommendations None recommended by PT  Recommendations for Other Services       Functional Status Assessment Patient has had a recent decline in their functional status and demonstrates the ability to make significant improvements in function in a reasonable and predictable amount of time.     Precautions / Restrictions Precautions Precautions: Fall Restrictions Weight Bearing Restrictions Per Provider Order: No      Mobility  Bed Mobility Overal bed mobility: Modified Independent                  Transfers Overall transfer level: Independent Equipment used: None                    Ambulation/Gait Ambulation/Gait assistance: Contact guard assist Gait Distance (Feet): 400 Feet Assistive device: Rolling walker (2 wheels), None Gait Pattern/deviations: Decreased stride length,  Step-through pattern, Scissoring Gait velocity: decreased     General Gait Details: pt initially ambulated to bathroom without AD however noted to be somewhat unsteady grabbing onto furniture with occasional scissoring gait noted.  Stairs            Wheelchair Mobility     Tilt Bed    Modified Rankin (Stroke Patients Only)       Balance Overall balance assessment: Needs assistance Sitting-balance support: Bilateral upper extremity supported, Feet supported Sitting balance-Leahy Scale: Fair     Standing balance support: Bilateral upper extremity supported, During functional activity, No upper extremity supported Standing balance-Leahy Scale: Poor Standing balance comment: Reliant on RW. Unsteady without RW.                             Pertinent Vitals/Pain Pain Assessment Pain Assessment: No/denies pain    Home Living Family/patient expects to be discharged to:: Private residence Living Arrangements: Alone Available Help at Discharge: Family;Available PRN/intermittently Type of Home: Apartment Home Access: Level entry       Home Layout: One level Home Equipment: Educational psychologist (2 wheels);Rollator (4 wheels);Cane - quad;Cane - single point;Shower seat      Prior Function Prior Level of Function : Independent/Modified Independent;Driving             Mobility Comments: Ind no AD ADLs Comments: Ind     Extremity/Trunk Assessment   Upper Extremity Assessment Upper Extremity Assessment: Overall WFL for tasks assessed    Lower Extremity Assessment Lower Extremity Assessment:  Overall [facility] for tasks assessed    Cervical / Trunk Assessment Cervical / Trunk Assessment: Normal  Communication   Communication Communication: No apparent difficulties  Cognition Arousal: Alert Behavior During Therapy: WFL for tasks assessed/performed Overall Cognitive Status: Within Functional Limits for tasks assessed                                           General Comments General comments (skin integrity, edema, etc.): SpO2 between 85-92% on RA. Pt reports no SOB.    Exercises     Assessment/Plan    PT Assessment Patient needs continued PT services  PT Problem List Decreased strength;Decreased range of motion;Decreased activity tolerance;Decreased mobility;Decreased balance;Decreased coordination;Decreased knowledge of use of DME;Decreased safety awareness;Decreased knowledge of precautions;Cardiopulmonary status limiting activity       PT Treatment Interventions DME instruction;Gait training;Stair training;Functional mobility training;Therapeutic activities;Therapeutic exercise;Balance training;Neuromuscular re-education;Patient/family education    PT Goals (Current goals can be found in the Care Plan section)  Acute Rehab PT Goals Patient Stated Goal: to go home PT Goal Formulation: With patient Time For Goal Achievement: 07/02/23 Potential to Achieve Goals: Good    Frequency Min 1X/week     Co-evaluation               AM-PAC PT "6 Clicks" Mobility  Outcome Measure Help needed turning from your back to your side while in a flat bed without using bedrails?: None Help needed moving from lying on your back to sitting on the side of a flat bed without using bedrails?: None Help needed moving to and from a bed to a chair (including a wheelchair)?: None Help needed standing up from a chair using your arms (e.g., wheelchair or bedside chair)?: None Help needed to walk in hospital room?: A Little Help needed climbing 3-5 steps with a railing? : A Little 6 Click Score: 22    End of Session Equipment Utilized During Treatment: Gait belt Activity Tolerance: Patient tolerated treatment well Patient left: in bed;with call bell/phone within reach;Other (comment) (Handoff to respiratory.) Nurse Communication: Mobility status;Other (comment) (O2 sats) PT Visit Diagnosis: Other abnormalities of gait  and mobility (R26.89)    Time: 1610-9604 PT Time Calculation (min) (ACUTE ONLY): 24 min   Charges:   PT Evaluation $PT Eval Moderate Complexity: 1 Mod PT Treatments $Gait Training: 8-22 mins PT General Charges $$ ACUTE PT VISIT: 1 Visit         Shela Nevin, PT, DPT Acute Rehab Services [phone]   Gladys Damme 06/18/2023, 10:16 AM

## 2023-06-18 NOTE — Evaluation (Signed)
Occupational Therapy Evaluation Patient Details Name: Amy Escobar MRN: [id number] DOB: [birth year] Today's Date: 06/18/2023   History of Present Illness Patient is a 77 y.o. female who presented with shortness of breath-patient was found to have acute hypoxic respiratory failure either from IPF flare or multifocal PNA. PMH: IPF, moderate pulmonary hypertension, nonobstructive CAD, HTN, HLD, DM-2.   Clinical Impression   Pt admitted for above, presents with decreased activity tolerance and seems to need 1L supplemental 02 while ambulating to maintain Sp02>93%. Pt overall CGA to setup for mobility and ADLs respectively. OT to continue following pt acutely to address deficits and educate on energy conservation prn. Anticipate no post acute OT needed at DC.        If plan is discharge home, recommend the following: Other (comment) (prn)    Functional Status Assessment  Patient has had a recent decline in their functional status and demonstrates the ability to make significant improvements in function in a reasonable and predictable amount of time.  Equipment Recommendations  None recommended by OT    Recommendations for Other Services       Precautions / Restrictions Precautions Precautions: Fall Restrictions Weight Bearing Restrictions Per Provider Order: No      Mobility Bed Mobility Overal bed mobility: Modified Independent                  Transfers Overall transfer level: Independent Equipment used: None                      Balance Overall balance assessment: Needs assistance Sitting-balance support: Bilateral upper extremity supported, Feet supported Sitting balance-Leahy Scale: Fair     Standing balance support: Bilateral upper extremity supported, During functional activity, No upper extremity supported Standing balance-Leahy Scale: Poor Standing balance comment: unsteady without RW                           ADL either performed  or assessed with clinical judgement   ADL Overall ADL's : Needs assistance/impaired Eating/Feeding: Independent;Sitting   Grooming: Standing;Contact guard assist   Upper Body Bathing: Standing;Contact guard assist   Lower Body Bathing: Sitting/lateral leans;Supervison/ safety;Set up   Upper Body Dressing : Set up;Sitting   Lower Body Dressing: Sitting/lateral leans;Set up   Toilet Transfer: Contact guard assist;Ambulation;Rolling walker (2 wheels)   Toileting- Clothing Manipulation and Hygiene: Contact guard assist;Sit to/from stand       Functional mobility during ADLs: Contact guard assist;Rolling walker (2 wheels)       Vision         Perception         Praxis         Pertinent Vitals/Pain Pain Assessment Pain Assessment: No/denies pain     Extremity/Trunk Assessment Upper Extremity Assessment Upper Extremity Assessment: Overall WFL for tasks assessed   Lower Extremity Assessment Lower Extremity Assessment: Overall WFL for tasks assessed   Cervical / Trunk Assessment Cervical / Trunk Assessment: Normal   Communication Communication Communication: No apparent difficulties Cueing Techniques: Verbal cues   Cognition Arousal: Alert Behavior During Therapy: WFL for tasks assessed/performed Overall Cognitive Status: Within Functional Limits for tasks assessed                                       General Comments  review ambulatory 02 note    Exercises  Shoulder Instructions      Home Living Family/patient expects to be discharged to:: Private residence Living Arrangements: Alone Available Help at Discharge: Family;Available PRN/intermittently Type of Home: Apartment Home Access: Level entry     Home Layout: One level     Bathroom Shower/Tub: Chief Strategy Officer: Standard Bathroom Accessibility: Yes   Home Equipment: Educational psychologist (2 wheels);Rollator (4 wheels);Cane - quad;Cane - single  point;Shower seat          Prior Functioning/Environment Prior Level of Function : Independent/Modified Independent;Driving             Mobility Comments: Ind no AD ADLs Comments: Ind        OT Problem List: Decreased activity tolerance;Impaired balance (sitting and/or standing)      OT Treatment/Interventions: Self-care/ADL training;Therapeutic activities;Therapeutic exercise;Energy conservation;Balance training;DME and/or AE instruction    OT Goals(Current goals can be found in the care plan section) Acute Rehab OT Goals Patient Stated Goal: To go home OT Goal Formulation: With patient Time For Goal Achievement: 07/02/23 Potential to Achieve Goals: Good  OT Frequency: Min 1X/week    Co-evaluation              AM-PAC OT "6 Clicks" Daily Activity     Outcome Measure Help from another person eating meals?: None Help from another person taking care of personal grooming?: A Little Help from another person toileting, which includes using toliet, bedpan, or urinal?: A Little Help from another person bathing (including washing, rinsing, drying)?: A Little Help from another person to put on and taking off regular upper body clothing?: A Little Help from another person to put on and taking off regular lower body clothing?: A Little 6 Click Score: 19   End of Session Equipment Utilized During Treatment: Gait belt;Rolling walker (2 wheels) Nurse Communication: Mobility status  Activity Tolerance: Patient tolerated treatment well Patient left: in bed;with call bell/phone within reach  OT Visit Diagnosis: Unsteadiness on feet (R26.81)                Time: 3474-2595 OT Time Calculation (min): 26 min Charges:  OT General Charges $OT Visit: 1 Visit OT Evaluation $OT Eval Low Complexity: 1 Low OT Treatments $Therapeutic Activity: 8-22 mins  06/18/2023  AB, OTR/L  Acute Rehabilitation Services  Office: [phone]   Tristan Schroeder 06/18/2023, 2:23 PM

## 2023-06-18 NOTE — Progress Notes (Addendum)
PROGRESS NOTE        PATIENT DETAILS Name: Amy Escobar Age: 77 y.o. Sex: female Date of Birth: [birth year] Admit Date: 06/15/2023 Admitting Physician Therisa Doyne, MD WUJ:[id number], Allyne Gee, FNP  Brief Summary: Patient is a 77 y.o.  female with history of IPF, moderate pulmonary hypertension, nonobstructive CAD, HTN, HLD, DM-2 who presented with shortness of breath-patient was found to have acute hypoxic respiratory failure either from IPF flare or multifocal PNA.  Significant events: 12/17>> admit to [facility].  Significant studies: 12/17>> CT angio chest: No PE, multifocal PNA superimposed on background of interstitial lung disease  Significant microbiology data: 12/17>> COVID/influenza/RSV PCR: 12/18>> respiratory virus panel: Not detected  Procedures: None  Consults: None  Subjective: Titrated to room air early this morning-O2 saturation in the low 90s.  Objective: Vitals: Blood pressure 136/88, pulse 82, temperature 97.9 F (36.6 C), temperature source Oral, resp. rate 20, height 5\' 4"  (1.626 m), SpO2 93%.   Exam: Gen Exam:Alert awake-not in any distress HEENT:atraumatic, normocephalic Chest: B/L clear to auscultation anteriorly CVS:S1S2 regular Abdomen:soft non tender, non distended Extremities:no edema Neurology: Non focal Skin: no rash  Pertinent Labs/Radiology:    Latest Ref Rng & Units 06/18/2023    2:44 AM 06/17/2023    4:16 AM 06/15/2023    1:52 PM  CBC  WBC 4.0 - 10.5 K/uL 9.8  8.7  8.8   Hemoglobin 12.0 - 15.0 g/dL 78.2  95.6  21.3   Hematocrit 36.0 - 46.0 % 37.8  36.0  37.9   Platelets 150 - 400 K/uL 331  372  368     Lab Results  Component Value Date   NA 135 06/18/2023   K 4.3 06/18/2023   CL 103 06/18/2023   CO2 20 (L) 06/18/2023      Assessment/Plan: Acute hypoxic respiratory failure-secondary to IPF flare versus multifocal pneumonia Suspected IPF flare-procalcitonin negative-low suspicion for  PNA Overall improved with steroids/antibiotics-titrated to room air today-but desaturates down to low-mid 80's on ambulation on room air Plan is for empiric antibiotics 7 days total-will switch to Augmentin on discharge. Continue with IV steroids for now-PCCM recommending 2-week slow taper of steroids on discharge. Discussed with patient/daughter that we may have to consider home O2 with ambulation on discharge.Although hesitant about Home O2 use-they are agreeable, but want to give it one more day-to see if she can come off O2 completely. Check ambulatory O2 sat tomorrow.  History of idiopathic pulmonary fibrosis Moderate pulm hypertension PCCM recommends that we hold Esbriet on discharge Follow with Dr. Marchelle Gearing postdischarge  HTN Continue Avapro  HLD Statin  DM-2 (A1c 5.9 on 9/9) CBGs able-SSI  Recent Labs    06/17/23 2021 06/18/23 0815 06/18/23 1202  GLUCAP 103* 142* 98    GERD PPI  Underweight: Estimated body mass index is 26.33 kg/m as calculated from the following:   Height as of this encounter: 5\' 4"  (1.626 m).   Weight as of 06/01/23: 69.6 kg.   Code status:   Code Status: Full Code   DVT Prophylaxis: enoxaparin (LOVENOX) injection 40 mg Start: 06/16/23 0915   Family Communication: Daughter-Michelle- [phone] updated over the phone 12/20.   Disposition Plan: Status is: Inpatient Remains inpatient appropriate because: Severity of illness   Planned Discharge Destination:Home   Diet: Diet Order  Diet heart healthy/carb modified Fluid consistency: Thin  Diet effective now                     Antimicrobial agents: Anti-infectives (From admission, onward)    Start     Dose/Rate Route Frequency Ordered Stop   06/16/23 1000  cefTRIAXone (ROCEPHIN) 1 g in sodium chloride 0.9 % 100 mL IVPB        1 g 200 mL/hr over 30 Minutes Intravenous Every 24 hours 06/16/23 0836     06/16/23 1000  doxycycline (VIBRA-TABS) tablet 100 mg         100 mg Oral Every 12 hours 06/16/23 0836     06/15/23 2130  cefTRIAXone (ROCEPHIN) 1 g in sodium chloride 0.9 % 100 mL IVPB        1 g 200 mL/hr over 30 Minutes Intravenous  Once 06/15/23 2117 06/15/23 2214   06/15/23 2130  azithromycin (ZITHROMAX) 500 mg in sodium chloride 0.9 % 250 mL IVPB        500 mg 250 mL/hr over 60 Minutes Intravenous  Once 06/15/23 2117 06/16/23 1610        MEDICATIONS: Scheduled Meds:  aspirin EC  81 mg Oral Daily   doxycycline  100 mg Oral Q12H   enoxaparin (LOVENOX) injection  40 mg Subcutaneous Q24H   fesoterodine  4 mg Oral Daily   gabapentin  100 mg Oral QHS   insulin aspart  0-5 Units Subcutaneous QHS   insulin aspart  0-9 Units Subcutaneous TID WC   irbesartan  150 mg Oral Daily   methylPREDNISolone (SOLU-MEDROL) injection  40 mg Intravenous Q12H   mometasone-formoterol  2 puff Inhalation BID   pantoprazole  40 mg Oral Daily   rosuvastatin  20 mg Oral Daily   senna-docusate  1 tablet Oral BID   Continuous Infusions:  cefTRIAXone (ROCEPHIN)  IV 1 g (06/18/23 0834)   PRN Meds:.acetaminophen **OR** acetaminophen, albuterol, bisacodyl, hydrALAZINE, oxyCODONE, polyethylene glycol   I have personally reviewed following labs and imaging studies  LABORATORY DATA: CBC: Recent Labs  Lab 06/15/23 1352 06/17/23 0416 06/18/23 0244  WBC 8.8 8.7 9.8  HGB 12.1 11.6* 11.7*  HCT 37.9 36.0 37.8  MCV 83.8 83.9 87.5  PLT 368 372 331    Basic Metabolic Panel: Recent Labs  Lab 06/15/23 1352 06/17/23 0416 06/18/23 0244  NA 132* 135 135  K 4.2 4.6 4.3  CL 99 104 103  CO2 25 23 20*  GLUCOSE 101* 139* 101*  BUN 11 13 15   CREATININE 0.84 0.68 0.72  CALCIUM 9.2 8.8* 9.3    GFR: CrCl cannot be calculated (Unknown ideal weight.).  Liver Function Tests: No results for input(s): "AST", "ALT", "ALKPHOS", "BILITOT", "PROT", "ALBUMIN" in the last 168 hours. No results for input(s): "LIPASE", "AMYLASE" in the last 168 hours. No results for  input(s): "AMMONIA" in the last 168 hours.  Coagulation Profile: No results for input(s): "INR", "PROTIME" in the last 168 hours.  Cardiac Enzymes: No results for input(s): "CKTOTAL", "CKMB", "CKMBINDEX", "TROPONINI" in the last 168 hours.  BNP (last 3 results) No results for input(s): "PROBNP" in the last 8760 hours.  Lipid Profile: No results for input(s): "CHOL", "HDL", "LDLCALC", "TRIG", "CHOLHDL", "LDLDIRECT" in the last 72 hours.  Thyroid Function Tests: No results for input(s): "TSH", "T4TOTAL", "FREET4", "T3FREE", "THYROIDAB" in the last 72 hours.  Anemia Panel: No results for input(s): "VITAMINB12", "FOLATE", "FERRITIN", "TIBC", "IRON", "RETICCTPCT" in the last 72 hours.  Urine analysis:  Component Value Date/Time   COLORURINE YELLOW 11/30/2018 1226   APPEARANCEUR CLEAR 11/30/2018 1226   LABSPEC 1.010 11/30/2018 1226   PHURINE 7.0 11/30/2018 1226   GLUCOSEU NEGATIVE 11/30/2018 1226   HGBUR NEGATIVE 11/30/2018 1226   HGBUR negative 07/30/2010 0823   BILIRUBINUR negative 09/04/2021 1019   BILIRUBINUR Negative 05/31/2020 1541   KETONESUR negative 09/04/2021 1019   KETONESUR NEGATIVE 11/30/2018 1226   PROTEINUR negative 09/04/2021 1019   PROTEINUR Negative 05/31/2020 1541   PROTEINUR NEGATIVE 03/09/2010 0913   UROBILINOGEN 0.2 09/04/2021 1019   UROBILINOGEN 0.2 11/30/2018 1226   NITRITE Negative 09/04/2021 1019   NITRITE Negative 05/31/2020 1541   NITRITE NEGATIVE 11/30/2018 1226   LEUKOCYTESUR Negative 09/04/2021 1019   LEUKOCYTESUR NEGATIVE 11/30/2018 1226    Sepsis Labs: Lactic Acid, Venous No results found for: "LATICACIDVEN"  MICROBIOLOGY: Recent Results (from the past 240 hours)  Resp panel by RT-PCR (RSV, Flu A&B, Covid) Anterior Nasal Swab     Status: None   Collection Time: 06/15/23  6:49 PM   Specimen: Anterior Nasal Swab  Result Value Ref Range Status   SARS Coronavirus 2 by RT PCR NEGATIVE NEGATIVE Final    Comment: (NOTE) SARS-CoV-2 target  nucleic acids are NOT DETECTED.  The SARS-CoV-2 RNA is generally detectable in upper respiratory specimens during the acute phase of infection. The lowest concentration of SARS-CoV-2 viral copies this assay can detect is 138 copies/mL. A negative result does not preclude SARS-Cov-2 infection and should not be used as the sole basis for treatment or other patient management decisions. A negative result may occur with  improper specimen collection/handling, submission of specimen other than nasopharyngeal swab, presence of viral mutation(s) within the areas targeted by this assay, and inadequate number of viral copies(<138 copies/mL). A negative result must be combined with clinical observations, patient history, and epidemiological information. The expected result is Negative.  Fact Sheet for Patients:  BloggerCourse.com  Fact Sheet for Healthcare Providers:  SeriousBroker.it  This test is no t yet approved or cleared by the [place] FDA and  has been authorized for detection and/or diagnosis of SARS-CoV-2 by FDA under an Emergency Use Authorization (EUA). This EUA will remain  in effect (meaning this test can be used) for the duration of the COVID-19 declaration under Section 564(b)(1) of the Act, 21 U.S.C.section 360bbb-3(b)(1), unless the authorization is terminated  or revoked sooner.       Influenza A by PCR NEGATIVE NEGATIVE Final   Influenza B by PCR NEGATIVE NEGATIVE Final    Comment: (NOTE) The Xpert Xpress SARS-CoV-2/FLU/RSV plus assay is intended as an aid in the diagnosis of influenza from Nasopharyngeal swab specimens and should not be used as a sole basis for treatment. Nasal washings and aspirates are unacceptable for Xpert Xpress SARS-CoV-2/FLU/RSV testing.  Fact Sheet for Patients: BloggerCourse.com  Fact Sheet for Healthcare  Providers: SeriousBroker.it  This test is not yet approved or cleared by the [place] FDA and has been authorized for detection and/or diagnosis of SARS-CoV-2 by FDA under an Emergency Use Authorization (EUA). This EUA will remain in effect (meaning this test can be used) for the duration of the COVID-19 declaration under Section 564(b)(1) of the Act, 21 U.S.C. section 360bbb-3(b)(1), unless the authorization is terminated or revoked.     Resp Syncytial Virus by PCR NEGATIVE NEGATIVE Final    Comment: (NOTE) Fact Sheet for Patients: BloggerCourse.com  Fact Sheet for Healthcare Providers: SeriousBroker.it  This test is not yet approved or cleared by the  [place] [employer] trader and has been authorized for detection and/or diagnosis of SARS-CoV-2 by FDA under an TEFL teacher (EUA). This EUA will remain in effect (meaning this test can be used) for the duration of the COVID-19 declaration under Section 564(b)(1) of the Act, 21 U.S.C. section 360bbb-3(b)(1), unless the authorization is terminated or revoked.  Performed at [facility], [address], [place], [place] [address]   Respiratory (~20 pathogens) panel by PCR     Status: None   Collection Time: 06/16/23 11:01 AM   Specimen: Nasopharyngeal Swab; Respiratory  Result Value Ref Range Status   Adenovirus NOT DETECTED NOT DETECTED Final   Coronavirus 229E NOT DETECTED NOT DETECTED Final    Comment: (NOTE) The Coronavirus on the Respiratory Panel, DOES NOT test for the novel  Coronavirus (2019 nCoV)    Coronavirus HKU1 NOT DETECTED NOT DETECTED Final   Coronavirus NL63 NOT DETECTED NOT DETECTED Final   Coronavirus OC43 NOT DETECTED NOT DETECTED Final   Metapneumovirus NOT DETECTED NOT DETECTED Final   Rhinovirus / Enterovirus NOT DETECTED NOT DETECTED Final   Influenza A NOT DETECTED NOT DETECTED Final   Influenza  B NOT DETECTED NOT DETECTED Final   Parainfluenza Virus 1 NOT DETECTED NOT DETECTED Final   Parainfluenza Virus 2 NOT DETECTED NOT DETECTED Final   Parainfluenza Virus 3 NOT DETECTED NOT DETECTED Final   Parainfluenza Virus 4 NOT DETECTED NOT DETECTED Final   Respiratory Syncytial Virus NOT DETECTED NOT DETECTED Final   Bordetella pertussis NOT DETECTED NOT DETECTED Final   Bordetella Parapertussis NOT DETECTED NOT DETECTED Final   Chlamydophila pneumoniae NOT DETECTED NOT DETECTED Final   Mycoplasma pneumoniae NOT DETECTED NOT DETECTED Final    Comment: Performed at [facility], [address] [place], [place] [address]    RADIOLOGY STUDIES/RESULTS: No results found.    LOS: 2 days   Jeoffrey Massed, MD  [facility]    To contact the attending provider between 7A-7P or the covering provider during after hours 7P-7A, please log into the web site [email or web address] and access using universal [place] password for that web site. If you do not have the password, please call the hospital operator.  06/18/2023, 1:13 PM

## 2023-06-19 DIAGNOSIS — J9601 Acute respiratory failure with hypoxia: Secondary | ICD-10-CM | POA: Diagnosis not present

## 2023-06-19 LAB — GLUCOSE, CAPILLARY
Glucose-Capillary: 139 mg/dL — ABNORMAL HIGH (ref 70–99)
Glucose-Capillary: 144 mg/dL — ABNORMAL HIGH (ref 70–99)

## 2023-06-19 MED ORDER — PREDNISONE 20 MG PO TABS
ORAL_TABLET | ORAL | 0 refills | Status: DC
Start: 2023-06-19 — End: 2023-09-13

## 2023-06-19 MED ORDER — AMOXICILLIN-POT CLAVULANATE 875-125 MG PO TABS
1.0000 | ORAL_TABLET | Freq: Two times a day (BID) | ORAL | Status: DC
  Administered 2023-06-19: 1 via ORAL
  Filled 2023-06-19: qty 1

## 2023-06-19 MED ORDER — AMOXICILLIN-POT CLAVULANATE 875-125 MG PO TABS: 1.0000 | ORAL_TABLET | Freq: Two times a day (BID) | ORAL | 0 refills | Status: AC

## 2023-06-19 NOTE — Discharge Summary (Signed)
[facility]  Physician Discharge Summary   Patient ID: Amy Escobar MRN: [id number] DOB/AGE: Mar 27, 1946 77 y.o.  Admit date: 06/15/2023 Discharge date: 06/19/2023    PCP: Olive Bass, FNP  DISCHARGE DIAGNOSES:    Acute respiratory failure with hypoxia (HCC)   ILD (interstitial lung disease) (HCC)   RECOMMENDATIONS FOR OUTPATIENT FOLLOW UP: Follow-up with pulmonology   Home Health: None Equipment/Devices: Home oxygen  CODE STATUS: Full code  DISCHARGE CONDITION: fair  Diet recommendation: As before  INITIAL HISTORY: Patient is a 77 y.o.  female with history of IPF, moderate pulmonary hypertension, nonobstructive CAD, HTN, HLD, DM-2 who presented with shortness of breath-patient was found to have acute hypoxic respiratory failure either from IPF flare or multifocal PNA.   Significant events: 12/17>> admit to [facility].   Significant studies: 12/17>> CT angio chest: No PE, multifocal PNA superimposed on background of interstitial lung disease   Significant microbiology data: 12/17>> COVID/influenza/RSV PCR: 12/18>> respiratory virus panel: Not detected  HOSPITAL COURSE:   Acute hypoxic respiratory failure-secondary to IPF flare versus multifocal pneumonia Suspected IPF flare-procalcitonin negative-low suspicion for PNA Overall improved with steroids/antibiotics-titrated to room air today-but desaturates down to low-mid 80's on ambulation on room air. Home oxygen has been ordered at discharge. Continue with oral antibiotics for few more days.  2 weeks slow taper of steroids. Pulmonology to arrange outpatient follow-up.   History of idiopathic pulmonary fibrosis Moderate pulm hypertension PCCM recommends that we hold Esbriet on discharge Follow with Dr. Marchelle Gearing postdischarge   HTN Continue Avapro   HLD Statin   DM-2 (A1c 5.9 on 9/9)   GERD  Patient is stable.  Okay for discharge home today.   PERTINENT LABS:  The results of  significant diagnostics from this hospitalization (including imaging, microbiology, ancillary and laboratory) are listed below for reference.    Microbiology: Recent Results (from the past 240 hours)  Resp panel by RT-PCR (RSV, Flu A&B, Covid) Anterior Nasal Swab     Status: None   Collection Time: 06/15/23  6:49 PM   Specimen: Anterior Nasal Swab  Result Value Ref Range Status   SARS Coronavirus 2 by RT PCR NEGATIVE NEGATIVE Final    Comment: (NOTE) SARS-CoV-2 target nucleic acids are NOT DETECTED.  The SARS-CoV-2 RNA is generally detectable in upper respiratory specimens during the acute phase of infection. The lowest concentration of SARS-CoV-2 viral copies this assay can detect is 138 copies/mL. A negative result does not preclude SARS-Cov-2 infection and should not be used as the sole basis for treatment or other patient management decisions. A negative result may occur with  improper specimen collection/handling, submission of specimen other than nasopharyngeal swab, presence of viral mutation(s) within the areas targeted by this assay, and inadequate number of viral copies(<138 copies/mL). A negative result must be combined with clinical observations, patient history, and epidemiological information. The expected result is Negative.  Fact Sheet for Patients:  BloggerCourse.com  Fact Sheet for Healthcare Providers:  SeriousBroker.it  This test is no t yet approved or cleared by the [place] FDA and  has been authorized for detection and/or diagnosis of SARS-CoV-2 by FDA under an Emergency Use Authorization (EUA). This EUA will remain  in effect (meaning this test can be used) for the duration of the COVID-19 declaration under Section 564(b)(1) of the Act, 21 U.S.C.section 360bbb-3(b)(1), unless the authorization is terminated  or revoked sooner.       Influenza A by PCR NEGATIVE NEGATIVE Final   Influenza B by PCR  NEGATIVE NEGATIVE Final    Comment: (NOTE) The Xpert Xpress SARS-CoV-2/FLU/RSV plus assay is intended as an aid in the diagnosis of influenza from Nasopharyngeal swab specimens and should not be used as a sole basis for treatment. Nasal washings and aspirates are unacceptable for Xpert Xpress SARS-CoV-2/FLU/RSV testing.  Fact Sheet for Patients: BloggerCourse.com  Fact Sheet for Healthcare Providers: SeriousBroker.it  This test is not yet approved or cleared by the [place] FDA and has been authorized for detection and/or diagnosis of SARS-CoV-2 by FDA under an Emergency Use Authorization (EUA). This EUA will remain in effect (meaning this test can be used) for the duration of the COVID-19 declaration under Section 564(b)(1) of the Act, 21 U.S.C. section 360bbb-3(b)(1), unless the authorization is terminated or revoked.     Resp Syncytial Virus by PCR NEGATIVE NEGATIVE Final    Comment: (NOTE) Fact Sheet for Patients: BloggerCourse.com  Fact Sheet for Healthcare Providers: SeriousBroker.it  This test is not yet approved or cleared by the [place] FDA and has been authorized for detection and/or diagnosis of SARS-CoV-2 by FDA under an Emergency Use Authorization (EUA). This EUA will remain in effect (meaning this test can be used) for the duration of the COVID-19 declaration under Section 564(b)(1) of the Act, 21 U.S.C. section 360bbb-3(b)(1), unless the authorization is terminated or revoked.  Performed at [facility], [address], [place], [place] [address]   Respiratory (~20 pathogens) panel by PCR     Status: None   Collection Time: 06/16/23 11:01 AM   Specimen: Nasopharyngeal Swab; Respiratory  Result Value Ref Range Status   Adenovirus NOT DETECTED NOT DETECTED Final   Coronavirus 229E NOT DETECTED NOT DETECTED Final    Comment:  (NOTE) The Coronavirus on the Respiratory Panel, DOES NOT test for the novel  Coronavirus (2019 nCoV)    Coronavirus HKU1 NOT DETECTED NOT DETECTED Final   Coronavirus NL63 NOT DETECTED NOT DETECTED Final   Coronavirus OC43 NOT DETECTED NOT DETECTED Final   Metapneumovirus NOT DETECTED NOT DETECTED Final   Rhinovirus / Enterovirus NOT DETECTED NOT DETECTED Final   Influenza A NOT DETECTED NOT DETECTED Final   Influenza B NOT DETECTED NOT DETECTED Final   Parainfluenza Virus 1 NOT DETECTED NOT DETECTED Final   Parainfluenza Virus 2 NOT DETECTED NOT DETECTED Final   Parainfluenza Virus 3 NOT DETECTED NOT DETECTED Final   Parainfluenza Virus 4 NOT DETECTED NOT DETECTED Final   Respiratory Syncytial Virus NOT DETECTED NOT DETECTED Final   Bordetella pertussis NOT DETECTED NOT DETECTED Final   Bordetella Parapertussis NOT DETECTED NOT DETECTED Final   Chlamydophila pneumoniae NOT DETECTED NOT DETECTED Final   Mycoplasma pneumoniae NOT DETECTED NOT DETECTED Final    Comment: Performed at [facility], [address] [place], [place] [address]     Labs:   Basic Metabolic Panel: Recent Labs  Lab 06/15/23 1352 06/17/23 0416 06/18/23 0244  NA 132* 135 135  K 4.2 4.6 4.3  CL 99 104 103  CO2 25 23 20*  GLUCOSE 101* 139* 101*  BUN 11 13 15   CREATININE 0.84 0.68 0.72  CALCIUM 9.2 8.8* 9.3    CBC: Recent Labs  Lab 06/15/23 1352 06/17/23 0416 06/18/23 0244  WBC 8.8 8.7 9.8  HGB 12.1 11.6* 11.7*  HCT 37.9 36.0 37.8  MCV 83.8 83.9 87.5  PLT 368 372 331     CBG: Recent Labs  Lab 06/18/23 1202 06/18/23 1650 06/18/23 1946 06/19/23 0820 06/19/23 1214  GLUCAP  98 123* 98 139* 144*     IMAGING STUDIES CT Angio Chest PE W/Cm &/Or Wo Cm Result Date: 06/15/2023 CLINICAL DATA:  Cough and hypoxia EXAM: CT ANGIOGRAPHY CHEST WITH CONTRAST TECHNIQUE: Multidetector CT imaging of the chest was performed using the standard protocol during bolus administration of intravenous  contrast. Multiplanar CT image reconstructions and MIPs were obtained to evaluate the vascular anatomy. RADIATION DOSE REDUCTION: This exam was performed according to the departmental dose-optimization program which includes automated exposure control, adjustment of the mA and/or kV according to patient size and/or use of iterative reconstruction technique. CONTRAST:  75mL OMNIPAQUE IOHEXOL 350 MG/ML SOLN COMPARISON:  Same-day radiograph and CT 01/29/2023 FINDINGS: Cardiovascular: Negative for acute pulmonary embolism. Normal caliber thoracic aorta. No pericardial effusion. Coronary artery and aortic atherosclerotic calcification. Mediastinum/Nodes: Trachea and esophagus are unremarkable. New mediastinal and hilar lymphadenopathy. For example a right hilar node measures 1.0 cm on series 5/image 109. A right paratracheal node measures 1.1 cm on series 5/image 69. Lungs/Pleura: Patchy bilateral ground-glass opacities and centrilobular micro nodules with more confluence airspace opacities in the lower lobes. Peripheral peribronchovascular reticular opacities compatible with interstitial lung disease. No pleural effusion or pneumothorax. Upper Abdomen: No acute abnormality. Musculoskeletal: No acute fracture. Review of the MIP images confirms the above findings. IMPRESSION: 1. Negative for acute pulmonary embolism. 2. Pulmonary findings are consistent with multifocal pneumonia superimposed on a background of interstitial lung disease. 3. New mediastinal and hilar lymphadenopathy, likely reactive. Aortic Atherosclerosis (ICD10-I70.0). Electronically Signed   By: Minerva Fester M.D.   On: 06/15/2023 21:12   DG Chest 2 View Result Date: 06/15/2023 CLINICAL DATA:  Shortness of breath.  Productive cough. EXAM: CHEST - 2 VIEW COMPARISON:  Chest radiographs 01/14/2023, 11/06/2021 05/06/2020, 04/05/2020 FINDINGS: Cardiac silhouette is at the upper limits of normal size. Mediastinal contours are within limits. Moderate  calcification within the aortic arch. Interval increase in now moderate diffuse bilateral interstitial thickening. This is superimposed on additional chronic mild moderate interstitial thickening at baseline suggesting interstitial lung disease. No acute buckle airspace opacity. No pleural effusion pneumothorax. Mild dextrocurvature of the midthoracic spine. Mild-to-moderate multilevel degenerative disc changes of the thoracic spine. IMPRESSION: 1. Interval increase in now moderate diffuse bilateral interstitial thickening. Findings may represent interstitial pulmonary edema or atypical infection superimposed on chronic interstitial lung disease. 2. Otherwise, no focal acute airspace opacity. Electronically Signed   By: Neita Garnet M.D.   On: 06/15/2023 16:02    DISCHARGE EXAMINATION: Vitals:   06/18/23 2058 06/19/23 0005 06/19/23 0622 06/19/23 0825  BP:  (!) 152/87 138/75 121/74  Pulse: 94 78 77 86  Resp: (!) 24 (!) 26 (!) 25 18  Temp:  98.1 F (36.7 C) 98 F (36.7 C) 98 F (36.7 C)  TempSrc:  Oral Oral Oral  SpO2: 92% 92% 98% 98%  Height:       General appearance: Awake alert.  In no distress Resp: Crackles bilaterally Cardio: S1-S2 is normal regular.  No S3-S4.  No rubs murmurs or bruit GI: Abdomen is soft.  Nontender nondistended.  Bowel sounds are present normal.  No masses organomegaly Extremities: No edema.  Full range of motion of lower extremities. Neurologic: Alert and oriented x3.  No focal neurological deficits.    DISPOSITION: Home  Discharge Instructions     Call MD for:  difficulty breathing, headache or visual disturbances   Complete by: As directed    Call MD for:  extreme fatigue   Complete by: As directed  Call MD for:  hives   Complete by: As directed    Call MD for:  persistant dizziness or light-headedness   Complete by: As directed    Call MD for:  persistant nausea and vomiting   Complete by: As directed    Call MD for:  severe uncontrolled pain    Complete by: As directed    Call MD for:  temperature >100.4   Complete by: As directed    Discharge instructions   Complete by: As directed    Please take your medications as prescribed.  Follow-up with Dr. Marchelle Gearing.  You were cared for by a hospitalist during your hospital stay. If you have any questions about your discharge medications or the care you received while you were in the hospital after you are discharged, you can call the unit and asked to speak with the hospitalist on call if the hospitalist that took care of you is not available. Once you are discharged, your primary care physician will handle any further medical issues. Please note that NO REFILLS for any discharge medications will be authorized once you are discharged, as it is imperative that you return to your primary care physician (or establish a relationship with a primary care physician if you do not have one) for your aftercare needs so that they can reassess your need for medications and monitor your lab values. If you do not have a primary care physician, you can call [phone] for a physician referral.   Increase activity slowly   Complete by: As directed          Allergies as of 06/19/2023       Reactions   Benicar [olmesartan] Other (See Comments)   Headache         Medication List     STOP taking these medications    budesonide-formoterol 160-4.5 MCG/ACT inhaler Commonly known as: SYMBICORT   cephALEXin 250 MG capsule Commonly known as: KEFLEX   lidocaine 5 % Commonly known as: Lidoderm   Pirfenidone 801 MG Tabs Commonly known as: Esbriet   Ryaltris 665-25 MCG/ACT Susp Generic drug: Olopatadine-Mometasone       TAKE these medications    acetaminophen 500 MG tablet Commonly known as: TYLENOL Take 500 mg by mouth daily as needed for headache.   amoxicillin-clavulanate 875-125 MG tablet Commonly known as: AUGMENTIN Take 1 tablet by mouth every 12 (twelve) hours for 4 days.    aspirin EC 81 MG tablet Take 81 mg by mouth daily.   CALCIUM + VITAMIN D3 PO Take 1 tablet by mouth daily.   esomeprazole 40 MG capsule Commonly known as: NEXIUM Take 1 capsule (40 mg total) by mouth 2 (two) times daily before a meal.   fesoterodine 4 MG Tb24 tablet Commonly known as: TOVIAZ Take 4 mg by mouth daily.   fluticasone-salmeterol 115-21 MCG/ACT inhaler Commonly known as: ADVAIR HFA Inhale 2 puffs into the lungs 2 (two) times daily.   gabapentin 300 MG capsule Commonly known as: NEURONTIN Take 300 mg by mouth daily as needed (neuropathy). What changed: Another medication with the same name was changed. Make sure you understand how and when to take each.   gabapentin 100 MG capsule Commonly known as: NEURONTIN Take 1 capsule (100 mg total) by mouth at bedtime. What changed: when to take this   OneTouch Verio Flex System w/Device Kit USE TO TEST BLOOD SUGAR DAILY AS DIRECTED   OneTouch Verio test strip Generic drug: glucose blood USE TO CHECK BLOOD  SUGAE TWO TIMES A DAY   Ozempic (0.25 or 0.5 MG/DOSE) 2 MG/3ML Sopn Generic drug: Semaglutide(0.25 or 0.5MG /DOS) Inject 0.5 mg into the skin every Sunday.   predniSONE 20 MG tablet Commonly known as: DELTASONE Take 3 tablets once daily for 5 days followed by 2 tablets once daily for 5 days followed by 1 tablet once daily for 5 days and then stop   rosuvastatin 20 MG tablet Commonly known as: CRESTOR TAKE 1 TABLET BY MOUTH DAILY   valsartan 160 MG tablet Commonly known as: DIOVAN Take 1 tablet (160 mg total) by mouth daily.   VITAMIN B-12 PO Take 1 tablet by mouth every Sunday.   ZINC PO Take 1 tablet by mouth daily.          Follow-up Information     Olive Bass, FNP. Schedule an appointment as soon as possible for a visit in 1 week(s).   Specialty: Internal Medicine Why: post hospitalization follow up Contact information: [address] [place]  [address] [phone]         Kalman Shan, MD Follow up.   Specialty: Pulmonary Disease Contact information: [address] [place] [address] [phone]                 TOTAL DISCHARGE TIME: 35 minutes  Purcell Jungbluth Rito Ehrlich  [facility] Pager on [email or web address]  06/20/2023, 10:14 AM

## 2023-06-19 NOTE — Progress Notes (Signed)
Patient and son has been provided discharge instructions to include home o2, medications, and follow up appointments. They both verbalize understanding of instructions.

## 2023-06-19 NOTE — TOC Transition Note (Signed)
Transition of Care [facility]) - Discharge Note   Patient Details  Name: Amy Escobar MRN: [id number] Date of Birth: [birth year]  Transition [facility]) CM/SW Contact:  Lawerance Sabal, RN Phone Number: 06/19/2023, 11:34 AM   Clinical Narrative:     Sherron Monday w patient and discussed needs for home.  She will home oxygen, she does not have a provider of preference and [employer] requested to deliver to unit to room.  Patient states that she will have family available to provide transportation home.  No other needs identified     Barriers to Discharge: No Barriers Identified   Patient Goals and CMS Choice Patient states their goals for this hospitalization and ongoing recovery are:: to go home CMS Medicare.gov Compare Post Acute Care list provided to:: Patient Choice offered to / list presented to : Patient      Discharge Placement                       Discharge Plan and Services Additional resources added to the After Visit Summary for     Discharge Planning Services: CM Consult Post Acute Care Choice: Durable Medical Equipment          DME Arranged: Oxygen DME Agency: [employer] Date DME Agency Contacted: 06/19/23 Time DME Agency Contacted: 1133 Representative spoke with at DME Agency: Vaughan Basta            Social Drivers of Health (SDOH) Interventions SDOH Screenings   Food Insecurity: No Food Insecurity (06/16/2023)  Housing: Low Risk  (06/16/2023)  Transportation Needs: No Transportation Needs (06/16/2023)  Utilities: Not At Risk (06/16/2023)  Alcohol Screen: Low Risk  (12/28/2022)  Depression (PHQ2-9): Low Risk  (12/28/2022)  Financial Resource Strain: Low Risk  (12/28/2022)  Physical Activity: Inactive (12/28/2022)  Social Connections: Moderately Integrated (12/28/2022)  Stress: No Stress Concern Present (12/28/2022)  Tobacco Use: Medium Risk (06/16/2023)     Readmission Risk Interventions     No data to display

## 2023-06-19 NOTE — Progress Notes (Signed)
Nurse requested Mobility Specialist to perform oxygen saturation test with pt which includes removing pt from oxygen both at rest and while ambulating.  Below are the results from that testing.     Patient Saturations on Room Air at Rest = spO2 94%  Patient Saturations on Room Air while Ambulating = sp02 86% .    Patient Saturations on 1 Liters of oxygen while Ambulating = sp02 91%  At end of testing pt left in room on 1  Liters of oxygen.  Reported results to nurse.   Addison Lank Mobility Specialist Please contact via SecureChat or  Rehab office at [phone]

## 2023-06-19 NOTE — Progress Notes (Signed)
Mobility Specialist Progress Note:   06/19/23 1100  Mobility  Activity Ambulated with assistance in hallway  Level of Assistance Contact guard assist, steadying assist  Assistive Device Front wheel walker  Distance Ambulated (ft) 250 ft  Activity Response Tolerated well  Mobility Referral Yes  Mobility visit 1 Mobility  Mobility Specialist Start Time (ACUTE ONLY) 1050  Mobility Specialist Stop Time (ACUTE ONLY) 1105  Mobility Specialist Time Calculation (min) (ACUTE ONLY) 15 min   Pt agreeable to mobility session. Required minG to ambulate. 1LO2 needed to maintain SpO2 WFL. Pt c/o minor SOB at EOS. Back in bed with all needs met.   Addison Lank Mobility Specialist Please contact via SecureChat or  Rehab office at [phone]

## 2023-06-19 NOTE — Plan of Care (Signed)
Pt stable on RA. Up to the bathroom, pt placed back on 2L decreased oxygen. Voiding and continuing to self-cath.    Problem: Education: Goal: Knowledge of General Education information will improve Description: Including pain rating scale, medication(s)/side effects and non-pharmacologic comfort measures Outcome: Progressing   Problem: Health Behavior/Discharge Planning: Goal: Ability to manage health-related needs will improve Outcome: Progressing   Problem: Clinical Measurements: Goal: Ability to maintain clinical measurements within normal limits will improve Outcome: Progressing Goal: Will remain free from infection Outcome: Progressing Goal: Diagnostic test results will improve Outcome: Progressing Goal: Respiratory complications will improve Outcome: Progressing Goal: Cardiovascular complication will be avoided Outcome: Progressing   Problem: Activity: Goal: Risk for activity intolerance will decrease Outcome: Progressing   Problem: Nutrition: Goal: Adequate nutrition will be maintained Outcome: Progressing   Problem: Coping: Goal: Level of anxiety will decrease Outcome: Progressing   Problem: Elimination: Goal: Will not experience complications related to bowel motility Outcome: Progressing Goal: Will not experience complications related to urinary retention Outcome: Progressing   Problem: Pain Management: Goal: General experience of comfort will improve Outcome: Progressing   Problem: Safety: Goal: Ability to remain free from injury will improve Outcome: Progressing   Problem: Skin Integrity: Goal: Risk for impaired skin integrity will decrease Outcome: Progressing   Problem: Education: Goal: Ability to describe self-care measures that may prevent or decrease complications (Diabetes Survival Skills Education) will improve Outcome: Progressing Goal: Individualized Educational Video(s) Outcome: Progressing   Problem: Coping: Goal: Ability to adjust  to condition or change in health will improve Outcome: Progressing   Problem: Fluid Volume: Goal: Ability to maintain a balanced intake and output will improve Outcome: Progressing   Problem: Health Behavior/Discharge Planning: Goal: Ability to identify and utilize available resources and services will improve Outcome: Progressing Goal: Ability to manage health-related needs will improve Outcome: Progressing   Problem: Metabolic: Goal: Ability to maintain appropriate glucose levels will improve Outcome: Progressing   Problem: Nutritional: Goal: Maintenance of adequate nutrition will improve Outcome: Progressing Goal: Progress toward achieving an optimal weight will improve Outcome: Progressing   Problem: Skin Integrity: Goal: Risk for impaired skin integrity will decrease Outcome: Progressing   Problem: Tissue Perfusion: Goal: Adequacy of tissue perfusion will improve Outcome: Progressing

## 2023-06-21 ENCOUNTER — Telehealth: Payer: Self-pay

## 2023-06-21 ENCOUNTER — Other Ambulatory Visit: Payer: Self-pay

## 2023-06-21 ENCOUNTER — Other Ambulatory Visit: Payer: Self-pay | Admitting: Family

## 2023-06-21 DIAGNOSIS — J849 Interstitial pulmonary disease, unspecified: Secondary | ICD-10-CM

## 2023-06-21 NOTE — Telephone Encounter (Signed)
Copied from CRM [id number]. Topic: Clinical - Home Health Verbal Orders >> Jun 21, 2023  3:10 PM Orinda Kenner C wrote: Caller/Agency: Sheryl RN case manager transition care at [facility] [phone] trying to set up for home health, PT is needed. Pt was released from hospital 06/19/23, pt will oxygen dependent. Pt will contact the office to scheduled an appt. Pls c/b.

## 2023-06-21 NOTE — Telephone Encounter (Signed)
Spoke wit Lynett Fish nurse, Elnita Maxwell states pt was discharged from the hospital with no home health orders or PT orders put in place. Elnita Maxwell was hoping we could reach out on the pts behalf with verbal orders as the pt is living by herself and may need help with her daily living activities. Also made [facility] nurse aware that the oxygen will be handled by pts pulmonologist, cheryl expressed understanding and stated she has advised pt to schedule a follow up with pulmonology as well.

## 2023-06-21 NOTE — Progress Notes (Signed)
Disenrolled patient from Transport planner.

## 2023-06-21 NOTE — Transitions of Care (Post Inpatient/ED Visit) (Signed)
06/21/2023  Name: Amy Escobar MRN: [id number] DOB: July 18, 1945  Today's TOC FU Call Status: Today's TOC FU Call Status:: Successful TOC FU Call Completed TOC FU Call Complete Date: 06/21/23 Patient's Name and Date of Birth confirmed.  Transition Care Management Follow-up Telephone Call Date of Discharge: 06/19/23 Discharge Facility: [facility]) Type of Discharge: Inpatient Admission Primary Inpatient Discharge Diagnosis:: Acute respiratory failure with hypoxia How have you been since you were released from the hospital?: Better Any questions or concerns?: Yes Patient Questions/Concerns:: Was sent home with oxygen (new need) and is having difficulty doing her own ADLs especially getting in and out of tub/ bed, etc - feels she needs home health services, especially physical therapy but also asked for healthcare assistant (HCA) or nurses aide (CNA). Agreed to call patient's PCP to request an order be placed for HHS eval. Patient Questions/Concerns Addressed:  (Called patient's PCP's office Amy Escobar [phone]) &requested call back from PCP's clinical staff to discuss need for Home Health Services,particularly Physical Therapy and possibly HCA (healthcare assistant) assistance. Awaiting CallBack)  Items Reviewed: Did you receive and understand the discharge instructions provided?: Yes Medications obtained,verified, and reconciled?: Yes (Medications Reviewed) Any new allergies since your discharge?: No Dietary orders reviewed?: Yes Type of Diet Ordered:: Low sodium, heart healthy diet Do you have support at home?: No (Daughter provides support such as drives her to appointments and checks on her but patient does live alone) People in Home: alone Name of Support/Comfort Primary Source: daughter [name] does not lives with patient but is supportive of patient by checking in on her and driving her to appointments  Medications Reviewed Today: Medications Reviewed Today      Reviewed by Amy Eke, RN (Registered Nurse) on 06/21/23 at 1536  Med List Status: <None>   Medication Order Taking? Sig Documenting Provider Last Dose Status Informant  acetaminophen (TYLENOL) 500 MG tablet [id number] Yes Take 500 mg by mouth daily as needed for headache. [provider] Taking Active Self, Pharmacy Records  amoxicillin-clavulanate (AUGMENTIN) 875-125 MG tablet [id number] Yes Take 1 tablet by mouth every 12 (twelve) hours for 4 days. Amy Shipper, MD Taking Active   aspirin EC 81 MG tablet [id number] Yes Take 81 mg by mouth daily. [provider] Taking Active Self, Pharmacy Records  Blood Glucose Monitoring Suppl [facility] [employer] VERIO FLEX SYSTEM) w/Device Andria Rhein [id number] Yes USE TO TEST BLOOD SUGAR DAILY AS DIRECTED Escobar, Amy Dolores, MD Taking Active Self, Pharmacy Records  Calcium Carb-Cholecalciferol (CALCIUM + VITAMIN D3 PO) [id number] Yes Take 1 tablet by mouth daily. [provider] Taking Active Self, Pharmacy Records  Cyanocobalamin (VITAMIN B-12 PO) [id number] Yes Take 1 tablet by mouth every Sunday. [provider] Taking Active Self, Pharmacy Records  esomeprazole (NEXIUM) 40 MG capsule [id number] Yes Take 1 capsule (40 mg total) by mouth 2 (two) times daily before a meal. Amy Scrape Allyne Gee, FNP Taking Active Self, Pharmacy Records  fesoterodine (TOVIAZ) 4 MG TB24 tablet [id number] Yes Take 4 mg by mouth daily. [provider] Taking Active Self, Pharmacy Records  fluticasone-salmeterol (ADVAIR [facility]) 169-67 MCG/ACT inhaler [id number] Yes Inhale 2 puffs into the lungs 2 (two) times daily. [provider] Taking Active Self, Pharmacy Records  gabapentin (NEURONTIN) 100 MG capsule [id number] Yes Take 1 capsule (100 mg total) by mouth at bedtime.  Patient taking differently: Take 100 mg by mouth 2 (two) times daily.   Escobar, Amy Dolores, MD Taking Active Self, Pharmacy Records  Med Note  (Escobar, Amy Schlein   Wed Jun 16, 2023 11:41 AM) Pt states she uses 2 strengths of gabapentin.  gabapentin (NEURONTIN) 300 MG capsule [id number] Yes Take 300 mg by mouth daily as needed (neuropathy). [provider] Taking Active Self, Pharmacy Records  glucose blood (ONETOUCH VERIO) test strip [id number] Yes USE TO CHECK BLOOD SUGAE TWO TIMES A DAY Escobar, Amy Dolores, MD Taking Active Self, Pharmacy Records  Multiple Vitamins-Minerals (ZINC PO) [id number] Yes Take 1 tablet by mouth daily. [provider] Taking Active Self, Pharmacy Records  predniSONE (DELTASONE) 20 MG tablet [id number] Yes Take 3 tablets once daily for 5 days followed by 2 tablets once daily for 5 days followed by 1 tablet once daily for 5 days and then stop Amy Shipper, MD Taking Active   rosuvastatin (CRESTOR) 20 MG tablet [id number] Yes TAKE 1 TABLET BY MOUTH DAILY Amy Sorrow, NP Taking Active Self, Pharmacy Records  Semaglutide,0.25 or 0.5MG /DOS, (OZEMPIC, 0.25 OR 0.5 MG/DOSE,) 2 MG/3ML SOPN [id number] Yes Inject 0.5 mg into the skin every Sunday. [provider] Taking Active Self, Pharmacy Records  valsartan (DIOVAN) 160 MG tablet [id number] Yes Take 1 tablet (160 mg total) by mouth daily. Amy Sprague, MD Taking Active Self, Pharmacy Records            Home Care and Equipment/Supplies: Were Home Health Services Ordered?: (S) No (Called patient's PCP's office Amy Escobar [phone]) &requested call back from PCP's clinical staff to discuss need for Home Health Services,particularly Physical Therapy and possibly HCA (healthcare assistant) assistance. Awaiting CallBack) Any new equipment or medical supplies ordered?: Yes Name of Medical supply agency?: [employer] - new need for Oxygen use at home Were you able to get the equipment/medical supplies?: Yes Do you have any questions related to the use of the equipment/supplies?: No  Functional Questionnaire: Do you  need assistance with bathing/showering or dressing?: (S) Yes (Called patient's PCP's office Amy Escobar [phone]) &requested call back from PCP's clinical staff to discuss need for Home Health Services,particularly Physical Therapy and possibly HCA (healthcare assistant) assistance. Awaiting CallBack) Do you need assistance with meal preparation?: Yes Do you need assistance with eating?: No Do you have difficulty maintaining continence: No Do you need assistance with getting out of bed/getting out of a chair/moving?: Yes (Called patient's PCP's office Amy Escobar [phone]) &requested call back from PCP's clinical staff to discuss need for Home Health Services,particularly Physical Therapy and possibly HCA (healthcare assistant) assistance. Awaiting CallBack) Do you have difficulty managing or taking your medications?: No  Follow up appointments reviewed: PCP Follow-up appointment confirmed?: (S) No (Patient stated she plans to call her PCP Olive Bass FNP and Specialist Dr. Marchelle Gearing tomorrow (12/24)) MD Provider Line [phone] Given: No Specialist Hospital Follow-up appointment confirmed?: No Reason Specialist Follow-Up Not Confirmed: Patient has Specialist Provider Number and will Call for Appointment (Patient stated she plans to call her PCP Olive Bass FNP and Specialist Dr. Marchelle Gearing tomorrow (12/24)) Do you need transportation to your follow-up appointment?: No (Patient states her daughter drives her to appointments) Do you understand care options if your condition(s) worsen?: Yes-patient verbalized understanding    Alyse Low, RN, BA, [facility], CRRN [facility] Population Health Care Management Coordinator, Transition of Care Ph # [phone]

## 2023-06-22 NOTE — Telephone Encounter (Signed)
Home health and pt orders have been placed.

## 2023-06-24 DIAGNOSIS — R0602 Shortness of breath: Secondary | ICD-10-CM | POA: Diagnosis not present

## 2023-06-26 DIAGNOSIS — Z7952 Long term (current) use of systemic steroids: Secondary | ICD-10-CM | POA: Diagnosis not present

## 2023-06-26 DIAGNOSIS — Z7951 Long term (current) use of inhaled steroids: Secondary | ICD-10-CM | POA: Diagnosis not present

## 2023-06-26 DIAGNOSIS — E785 Hyperlipidemia, unspecified: Secondary | ICD-10-CM | POA: Diagnosis not present

## 2023-06-26 DIAGNOSIS — D509 Iron deficiency anemia, unspecified: Secondary | ICD-10-CM | POA: Diagnosis not present

## 2023-06-26 DIAGNOSIS — K219 Gastro-esophageal reflux disease without esophagitis: Secondary | ICD-10-CM | POA: Diagnosis not present

## 2023-06-26 DIAGNOSIS — M810 Age-related osteoporosis without current pathological fracture: Secondary | ICD-10-CM | POA: Diagnosis not present

## 2023-06-26 DIAGNOSIS — J849 Interstitial pulmonary disease, unspecified: Secondary | ICD-10-CM | POA: Diagnosis not present

## 2023-06-26 DIAGNOSIS — M109 Gout, unspecified: Secondary | ICD-10-CM | POA: Diagnosis not present

## 2023-06-26 DIAGNOSIS — Z7982 Long term (current) use of aspirin: Secondary | ICD-10-CM | POA: Diagnosis not present

## 2023-06-26 DIAGNOSIS — E113393 Type 2 diabetes mellitus with moderate nonproliferative diabetic retinopathy without macular edema, bilateral: Secondary | ICD-10-CM | POA: Diagnosis not present

## 2023-06-26 DIAGNOSIS — Z7985 Long-term (current) use of injectable non-insulin antidiabetic drugs: Secondary | ICD-10-CM | POA: Diagnosis not present

## 2023-06-26 DIAGNOSIS — I4729 Other ventricular tachycardia: Secondary | ICD-10-CM | POA: Diagnosis not present

## 2023-06-26 DIAGNOSIS — I272 Pulmonary hypertension, unspecified: Secondary | ICD-10-CM | POA: Diagnosis not present

## 2023-06-26 DIAGNOSIS — E538 Deficiency of other specified B group vitamins: Secondary | ICD-10-CM | POA: Diagnosis not present

## 2023-06-26 DIAGNOSIS — K769 Liver disease, unspecified: Secondary | ICD-10-CM | POA: Diagnosis not present

## 2023-06-26 DIAGNOSIS — I10 Essential (primary) hypertension: Secondary | ICD-10-CM | POA: Diagnosis not present

## 2023-06-26 DIAGNOSIS — E1142 Type 2 diabetes mellitus with diabetic polyneuropathy: Secondary | ICD-10-CM | POA: Diagnosis not present

## 2023-06-26 DIAGNOSIS — I493 Ventricular premature depolarization: Secondary | ICD-10-CM | POA: Diagnosis not present

## 2023-06-26 DIAGNOSIS — Z792 Long term (current) use of antibiotics: Secondary | ICD-10-CM | POA: Diagnosis not present

## 2023-06-26 DIAGNOSIS — Z9981 Dependence on supplemental oxygen: Secondary | ICD-10-CM | POA: Diagnosis not present

## 2023-06-26 DIAGNOSIS — Z556 Problems related to health literacy: Secondary | ICD-10-CM | POA: Diagnosis not present

## 2023-06-26 DIAGNOSIS — Z791 Long term (current) use of non-steroidal anti-inflammatories (NSAID): Secondary | ICD-10-CM | POA: Diagnosis not present

## 2023-06-27 DIAGNOSIS — K769 Liver disease, unspecified: Secondary | ICD-10-CM | POA: Diagnosis not present

## 2023-06-27 DIAGNOSIS — M810 Age-related osteoporosis without current pathological fracture: Secondary | ICD-10-CM | POA: Diagnosis not present

## 2023-06-27 DIAGNOSIS — I272 Pulmonary hypertension, unspecified: Secondary | ICD-10-CM | POA: Diagnosis not present

## 2023-06-27 DIAGNOSIS — Z791 Long term (current) use of non-steroidal anti-inflammatories (NSAID): Secondary | ICD-10-CM | POA: Diagnosis not present

## 2023-06-27 DIAGNOSIS — I4729 Other ventricular tachycardia: Secondary | ICD-10-CM | POA: Diagnosis not present

## 2023-06-27 DIAGNOSIS — D509 Iron deficiency anemia, unspecified: Secondary | ICD-10-CM | POA: Diagnosis not present

## 2023-06-27 DIAGNOSIS — Z7952 Long term (current) use of systemic steroids: Secondary | ICD-10-CM | POA: Diagnosis not present

## 2023-06-27 DIAGNOSIS — I10 Essential (primary) hypertension: Secondary | ICD-10-CM | POA: Diagnosis not present

## 2023-06-27 DIAGNOSIS — I493 Ventricular premature depolarization: Secondary | ICD-10-CM | POA: Diagnosis not present

## 2023-06-27 DIAGNOSIS — Z792 Long term (current) use of antibiotics: Secondary | ICD-10-CM | POA: Diagnosis not present

## 2023-06-27 DIAGNOSIS — Z7951 Long term (current) use of inhaled steroids: Secondary | ICD-10-CM | POA: Diagnosis not present

## 2023-06-27 DIAGNOSIS — Z7982 Long term (current) use of aspirin: Secondary | ICD-10-CM | POA: Diagnosis not present

## 2023-06-27 DIAGNOSIS — Z9981 Dependence on supplemental oxygen: Secondary | ICD-10-CM | POA: Diagnosis not present

## 2023-06-27 DIAGNOSIS — E785 Hyperlipidemia, unspecified: Secondary | ICD-10-CM | POA: Diagnosis not present

## 2023-06-27 DIAGNOSIS — J849 Interstitial pulmonary disease, unspecified: Secondary | ICD-10-CM | POA: Diagnosis not present

## 2023-06-27 DIAGNOSIS — E113393 Type 2 diabetes mellitus with moderate nonproliferative diabetic retinopathy without macular edema, bilateral: Secondary | ICD-10-CM | POA: Diagnosis not present

## 2023-06-27 DIAGNOSIS — K219 Gastro-esophageal reflux disease without esophagitis: Secondary | ICD-10-CM | POA: Diagnosis not present

## 2023-06-27 DIAGNOSIS — M109 Gout, unspecified: Secondary | ICD-10-CM | POA: Diagnosis not present

## 2023-06-27 DIAGNOSIS — E538 Deficiency of other specified B group vitamins: Secondary | ICD-10-CM | POA: Diagnosis not present

## 2023-06-27 DIAGNOSIS — Z7985 Long-term (current) use of injectable non-insulin antidiabetic drugs: Secondary | ICD-10-CM | POA: Diagnosis not present

## 2023-06-27 DIAGNOSIS — Z556 Problems related to health literacy: Secondary | ICD-10-CM | POA: Diagnosis not present

## 2023-06-27 DIAGNOSIS — E1142 Type 2 diabetes mellitus with diabetic polyneuropathy: Secondary | ICD-10-CM | POA: Diagnosis not present

## 2023-06-28 ENCOUNTER — Other Ambulatory Visit: Payer: Self-pay

## 2023-06-28 ENCOUNTER — Telehealth: Payer: Self-pay

## 2023-06-28 NOTE — Patient Outreach (Signed)
  Care Management  Transitions of Care Program Transitions of Care Post-discharge week 2  06/28/2023 Name: Amy Escobar MRN: [id number] DOB: [birth year]  Subjective: Amy Escobar is a 77 y.o. year old female who is a primary care patient of Olive Bass, FNP. The Care Management team was unable to reach the patient by phone to assess and address transitions of care needs.   Plan: Additional outreach attempts will be made to reach the patient enrolled in the [facility] Program (Post Inpatient/ED Visit).  Rescheduled missed appointment for 06/29/23 @ 3pm   Alyse Low, RN, BA, [facility], CRRN [facility] Population Health Care Management Coordinator, Transition of Care Ph # [phone]

## 2023-06-29 ENCOUNTER — Telehealth: Payer: Self-pay

## 2023-06-29 ENCOUNTER — Telehealth: Payer: Self-pay | Admitting: Family

## 2023-06-29 ENCOUNTER — Other Ambulatory Visit: Payer: Self-pay | Admitting: Family

## 2023-06-29 ENCOUNTER — Other Ambulatory Visit: Payer: Self-pay

## 2023-06-29 DIAGNOSIS — J849 Interstitial pulmonary disease, unspecified: Secondary | ICD-10-CM

## 2023-06-29 NOTE — Telephone Encounter (Signed)
 Copied from CRM [id number]. Topic: Clinical - Home Health Verbal Orders >> Jun 28, 2023  4:56 PM Corin V wrote: Caller/Agency: Lorenda Rushing Number: [phone] Service Requested: Physical Therapy Frequency: twice a week for 1 week and once per week for 7 weeks. Any new concerns about the patient? Yes, patient needs a bedside commode for at nighttime if an order can be sent to a medical supply store.

## 2023-06-29 NOTE — Patient Outreach (Signed)
 Care Management  Transitions of Care Program Transitions of Care Post-discharge week 2   06/29/2023 Name: Amy Escobar MRN: [id number] DOB: 12-09-1945  Subjective: Amy Escobar is a 77 y.o. year old female who is a primary care patient of Jason Leita Repine, FNP. The Care Management team Engaged with patient Engaged with patient by telephone to assess and address transitions of care needs.   Consent to Services:  Patient was given information about care management services, agreed to services, and gave verbal consent to participate.   Assessment:           SDOH Interventions    Flowsheet Row Clinical Support from 12/28/2022 in [facility] Primary Care at [facility] Documentation from 07/31/2022 in [facility] Clinical Support from 07/17/2020 in [facility] at [facility] Office Visit from 03/31/2019 in [facility]  SDOH Interventions      Food Insecurity Interventions -- Intervention Not Indicated Intervention Not Indicated --  Housing Interventions -- Intervention Not Indicated Intervention Not Indicated --  Transportation Interventions -- Intervention Not Indicated Intervention Not Indicated --  Utilities Interventions -- Intervention Not Indicated -- --  Alcohol Usage Interventions Intervention Not Indicated (Score <7) -- -- --  Depression Interventions/Treatment  -- -- -- PHQ2-9 Score <4 Follow-up Not Indicated  Financial Strain Interventions Intervention Not Indicated -- Intervention Not Indicated --  Physical Activity Interventions Intervention Not Indicated -- Intervention Not Indicated --  Stress Interventions Intervention Not Indicated -- Intervention Not Indicated --  Social Connections Interventions Intervention Not Indicated -- Intervention Not Indicated --        Goals Addressed             This Visit's Progress    Transition of Care       Current Barriers:   Knowledge Deficits related to plan of care for management of Pulmonary Disease , Interstitial Lung Disease abbreviated ILD  RNCM Clinical Goal(s):  Patient will work with the Care Management team over the next 30 days to address Transition of Care Barriers: Medication access Medication Management Diet/Nutrition/Food Resources Support at home Provider appointments Home Health services Equipment/DME Functional/Safety Transportation And new need for Oxygen  Therapy 24/7  through collaboration with Medical Illustrator, provider, and care team.   Interventions: Evaluation of current treatment plan related to  self management and patient's adherence to plan as established by provider 06/29/23 Completion of TOC Program Adult Health Management including review of cardiovascular, respiratory, endocrine, gastrointestinal, neurological, and musculoskeletal health management needs  Transitions of Care:  Ongoing Doctor Visits  - discussed the importance of doctor visits Referral to Longitudinal Nurse Case Manager for Ongoing follow-up Contacted provider for patient needs 06/21/23 Called and spoke to PCP [name]'s staff member [name] requesting an order for [facility] Evaluation as patient appears to qualify for Physical Therapy and possibly HomeCare Assistant due to patient: lives alone, exhibits declining functional status since hospitalized for Acute Respiratory Failure with hypoxia, and now requires use of oxygen  via nasal cannula at home 06/29/23 Update : Patient has been approved for Home health Services - RN/PT/OT and home visits started this past week.  Patient Goals/Self-Care Activities: Participate in Transition of Care Program/Attend TOC scheduled calls Take all medications as prescribed Attend all scheduled provider appointments Call pharmacy for medication refills 3-7 days in advance of running out of medications Call provider office for new concerns or  questions   Follow Up Plan:  The  patient has been provided with contact information for the care management team and has been advised to call with any health related questions or concerns.  Next telephone appointment with [facility] RN is scheduled for 07/06/23 @ 1pm.        Plan: The patient has been provided with contact information for the care management team and has been advised to call with any health related questions or concerns.   Channing Larry, RN, BA, [facility], CRRN [facility] Coordinator, Transition of Care Ph # [phone]

## 2023-06-29 NOTE — Patient Instructions (Signed)
 Visit Information  Thank you for taking time to visit with me today. Please don't hesitate to contact me if I can be of assistance to you before our next scheduled telephone appointment.  Our next appointment is by telephone on 07/06/23 at 1pm  Following is a copy of your care plan:   Goals Addressed             This Visit's Progress    Transition of Care       Current Barriers:  Knowledge Deficits related to plan of care for management of Pulmonary Disease , Interstitial Lung Disease abbreviated ILD  RNCM Clinical Goal(s):  Patient will work with the Care Management team over the next 30 days to address Transition of Care Barriers: Medication access Medication Management Diet/Nutrition/Food Resources Support at home Provider appointments Home Health services Equipment/DME Functional/Safety Transportation And new need for Oxygen  Therapy 24/7  through collaboration with Medical Illustrator, provider, and care team.   Interventions: Evaluation of current treatment plan related to  self management and patient's adherence to plan as established by provider 06/29/23 Completion of TOC Program Adult Health Management including review of cardiovascular, respiratory, endocrine, gastrointestinal, neurological, and musculoskeletal health management needs  Transitions of Care:  Ongoing Doctor Visits  - discussed the importance of doctor visits Referral to Longitudinal Nurse Case Manager for Ongoing follow-up Contacted provider for patient needs 06/21/23 Called and spoke to PCP [name]'s staff member [name] requesting an order for [facility] Evaluation as patient appears to qualify for Physical Therapy and possibly HomeCare Assistant due to patient: lives alone, exhibits declining functional status since hospitalized for Acute Respiratory Failure with hypoxia, and now requires use of oxygen  via nasal cannula at home 06/29/23 Update : Patient has been approved for Home  health Services - RN/PT/OT and home visits started this past week.  Patient Goals/Self-Care Activities: Participate in Transition of Care Program/Attend TOC scheduled calls Take all medications as prescribed Attend all scheduled provider appointments Call pharmacy for medication refills 3-7 days in advance of running out of medications Call provider office for new concerns or questions   Follow Up Plan:  The patient has been provided with contact information for the care management team and has been advised to call with any health related questions or concerns.  Next telephone appointment with [facility] RN is scheduled for 07/06/23 @ 1pm.        Patient verbalizes understanding of instructions and care plan provided today and agrees to view in MyChart. Active MyChart status and patient understanding of how to access instructions and care plan via MyChart confirmed with patient.     The patient has been provided with contact information for the care management team and has been advised to call with any health related questions or concerns.   Please call the care guide team at [phone] if you need to cancel or reschedule your appointment.   Please call 1-800-273-TALK (toll free, 24 hour hotline) if you are experiencing a Mental Health or Behavioral Health Crisis or need someone to talk to.  Channing Larry, RN, BA, [facility], CRRN [facility] Coordinator, Transition of Care Ph # [phone]

## 2023-06-29 NOTE — Telephone Encounter (Signed)
Community message has been sent to [employer]

## 2023-07-02 ENCOUNTER — Telehealth: Payer: Self-pay | Admitting: Family

## 2023-07-02 NOTE — Telephone Encounter (Signed)
 Copied from CRM [id number]. Topic: Clinical - Home Health Verbal Orders >> Jul 02, 2023 11:32 AM Robinson DEL wrote: Caller/Agency: Cherylann Rushing Number: [phone] Service Requested: Physical Therapy Frequency: 2 week 1, 1 week 8 Any new concerns about the patient? No

## 2023-07-02 NOTE — Telephone Encounter (Signed)
 Spoke with Amy Escobar, orders have been given.

## 2023-07-05 ENCOUNTER — Ambulatory Visit: Payer: 59 | Admitting: Primary Care

## 2023-07-05 ENCOUNTER — Ambulatory Visit (INDEPENDENT_AMBULATORY_CARE_PROVIDER_SITE_OTHER): Payer: 59

## 2023-07-05 ENCOUNTER — Telehealth: Payer: Self-pay | Admitting: Primary Care

## 2023-07-05 ENCOUNTER — Encounter: Payer: Self-pay | Admitting: Primary Care

## 2023-07-05 VITALS — BP 129/78 | HR 79 | Temp 97.7°F | Ht 64.5 in | Wt 155.8 lb

## 2023-07-05 DIAGNOSIS — J9601 Acute respiratory failure with hypoxia: Secondary | ICD-10-CM | POA: Diagnosis not present

## 2023-07-05 DIAGNOSIS — J84112 Idiopathic pulmonary fibrosis: Secondary | ICD-10-CM

## 2023-07-05 DIAGNOSIS — N390 Urinary tract infection, site not specified: Secondary | ICD-10-CM | POA: Diagnosis not present

## 2023-07-05 DIAGNOSIS — R053 Chronic cough: Secondary | ICD-10-CM

## 2023-07-05 DIAGNOSIS — Z5181 Encounter for therapeutic drug level monitoring: Secondary | ICD-10-CM

## 2023-07-05 NOTE — Telephone Encounter (Signed)
 Called patient.  Patient has to come to our office tomorrow to get labs drawn.  She will bring the paper about reactions between Pirfenidone  and cephalexin  and give it to the front desk so they can get it to [facility] tomorrow.    Front desk,  please look out for this paper and put in [employer].  Beth,  just [place].

## 2023-07-05 NOTE — Telephone Encounter (Signed)
 Ok thank you. It was a piece of paper she handed to me about pirfenidone . Ill ask if she can upload to her mychart. It may have been a miscommunication. Thanks Devki   Amy can you ask if patient cqan upload piece of paper regarding pirfenidone  and drug reaction with cephalexin ? Or can she bring by a copy. Pharmacy reports there is no know reaction to these two medication which would be great news for her treatment plan

## 2023-07-05 NOTE — Telephone Encounter (Addendum)
 Patient is on Keflex  daily for prevention of UTI. There is an interaction listed with pirfenidon and keflex . Her LFTs were slightly elevated in December, she was told to stop pirfenidone . Planning on rechecking LFTs today off both medications.   Pharmacy- what is the interaction and how severe is it?  Dr. Geronimo- Would you recommend re-starting esbriet  at lower dose (staying on 2 tablets TID) and/or will she need to change abx for UTI prevention.

## 2023-07-05 NOTE — Telephone Encounter (Signed)
 I do not see any interaction between pirfenidone  and cephalexin  even with detailed investigation of pharmacokinetics. What was the interaction that you read?  She has limited options for UTI ppx as Macrobid  is not an option with her ILD  Amy Escobar, PharmD, MPH, BCPS, CPP Clinical Pharmacist (Rheumatology and Pulmonology)

## 2023-07-05 NOTE — Patient Instructions (Addendum)
-  INTERSTITIAL LUNG DISEASE: Interstitial lung disease involves scarring of the lung tissue, which can make it difficult to breathe. We will check your liver function tests today. If they are normal, we will restart Pirfenidone  at a lower dose (2 tablets three times a day) and closely monitor your liver function tests.  -URINARY TRACT INFECTION (UTI) PREVENTION: You have a history of urinary tract infections and were previously on Keflex  to prevent them. Since you are currently off Pirfenidone , we will resume Keflex  for UTI prevention. We will also consult with a pharmacist about the interaction between Keflex  and Pirfenidone  and consider alternative antibiotics like Cipro or Bactrim  if necessary.  Orders: Labs today Walk test today   Follow-up: 8-12 weeks with Dr. Geronimo (30 min-ILD slot)

## 2023-07-05 NOTE — Progress Notes (Signed)
 @Patient  ID: Amy Escobar, female    DOB: 1945-08-17, 78 y.o.   MRN: [id number]  No chief complaint on file.   Referring provider: Jason Leita Repine,*  HPI: 72 year olf female, former smoker. PMH upper airway cough, URI, seasonal allergic rhinitis, HTN, mild pulmonary hypertension. Former patient of both Dr. Darlean and Dr. Shellia, established with Dr. Geronimo in October 2024 as a new patient.   07/05/2023 Discussed the use of AI scribe software for clinical note transcription with the patient, who gave verbal consent to proceed.  Patient was given a provisional diagnosis of IPF based on age, disease prodominantly in lower lobes (probably UIP) and normal serology. She was started on pirfenidone  in October. Liver function was slightly elevated in December, medication was stopped per Dr. Geronimo and restarted at lower dose. The patient reported experiencing dizziness and feeling stoned while on the medication, to the point of being unable to drive. Patient notified our office on 06/14/2023 with reports of bronchitis symptoms with increased shortness of breath. She was admitted for IPF flare vs PNA. She was discharged on Augmentin  which she has completed and has 1 day left of prednisone  as well at home oxygen  at 2L. Respiratory-wise, the patient denies any current breathing difficulties or shortness of breath. They reported a very minimal cough with occasional clear mucus production, and no wheezing or chest tightness. They also mentioned having acid reflux, which is exacerbated by certain foods.  The patient also has a bladder problem, for which they take Toviaz . They reported that they have been waking up to urinate and have been experiencing urinary urgency. They also mentioned that they have been having issues with emptying their bladder completely. The patient was on a daily antibiotic, Keflex , for urinary tract infection prevention, but it was stopped during their hospital stay due to a  potential interaction with the Pirfenidone .     Allergies  Allergen Reactions   Benicar [Olmesartan] Other (See Comments)    Headache     Immunization History  Administered Date(s) Administered   Fluad Quad(high Dose 65+) 04/18/2019   Influenza Split 03/23/2011, 04/07/2013   Influenza Whole 04/13/2008, 04/29/2009, 03/29/2010, 03/29/2018   Influenza, High Dose Seasonal PF 03/11/2023   Influenza-Unspecified 04/08/2012, 03/28/2014, 03/14/2015, 03/13/2016, 04/15/2020   PFIZER(Purple Top)SARS-COV-2 Vaccination 08/13/2019, 09/05/2019, 04/15/2020   PPD Test 03/23/2011   Pfizer Covid-19 Vaccine Bivalent Booster 24yrs & up 04/29/2021   Pneumococcal Conjugate-13 01/10/2014   Pneumococcal Polysaccharide-23 07/07/2010, 03/31/2018   Td 12/27/2001   Tdap 08/06/2014   Zoster Recombinant(Shingrix) 03/31/2018    Past Medical History:  Diagnosis Date   Alkaline phosphatase deficiency    w/u Ne   Allergic rhinitis    Allergy     Anxiety    Arthritis    Cataract    BILATERAL-REMOVED   DM2 (diabetes mellitus, type 2) (HCC)    GERD (gastroesophageal reflux disease)    Gout    Hemorrhoids    Hyperlipidemia    Hypertension    Interstitial lung disease (HCC)    Mild pulmonary hypertension (HCC)    NSVT (nonsustained ventricular tachycardia) (HCC)    Osteoporosis    PVC's (premature ventricular contractions)    Thyroid  nodule    small   Tubular adenoma of colon 2017   Urticaria    Uterine fibroid     Tobacco History: Social History   Tobacco Use  Smoking Status Former   Current packs/day: 0.00   Average packs/day: 0.3 packs/day for 5.0 years (1.3 ttl pk-yrs)  Types: Cigarettes   Start date: 06/30/1963   Quit date: 06/29/1968   Years since quitting: 55.0  Smokeless Tobacco Never   Counseling given: Not Answered   Outpatient Medications Prior to Visit  Medication Sig Dispense Refill   acetaminophen  (TYLENOL ) 500 MG tablet Take 500 mg by mouth daily as needed for headache.      aspirin  EC 81 MG tablet Take 81 mg by mouth daily.     Blood Glucose Monitoring Suppl (ONETOUCH VERIO FLEX SYSTEM) w/Device KIT USE TO TEST BLOOD SUGAR DAILY AS DIRECTED 1 kit 0   Calcium  Carb-Cholecalciferol (CALCIUM  + VITAMIN D3 PO) Take 1 tablet by mouth daily.     Cyanocobalamin  (VITAMIN B-12 PO) Take 1 tablet by mouth every Sunday.     esomeprazole  (NEXIUM ) 40 MG capsule Take 1 capsule (40 mg total) by mouth 2 (two) times daily before a meal. 180 capsule 0   fesoterodine  (TOVIAZ ) 4 MG TB24 tablet Take 4 mg by mouth daily.     fluticasone -salmeterol (ADVAIR HFA) 115-21 MCG/ACT inhaler Inhale 2 puffs into the lungs 2 (two) times daily.     gabapentin  (NEURONTIN ) 100 MG capsule Take 1 capsule (100 mg total) by mouth at bedtime. (Patient taking differently: Take 100 mg by mouth 2 (two) times daily.) 90 capsule 3   gabapentin  (NEURONTIN ) 300 MG capsule Take 300 mg by mouth daily as needed (neuropathy).     glucose blood (ONETOUCH VERIO) test strip USE TO CHECK BLOOD SUGAE TWO TIMES A DAY 100 strip 3   Multiple Vitamins-Minerals (ZINC PO) Take 1 tablet by mouth daily.     predniSONE  (DELTASONE ) 20 MG tablet Take 3 tablets once daily for 5 days followed by 2 tablets once daily for 5 days followed by 1 tablet once daily for 5 days and then stop 30 tablet 0   rosuvastatin  (CRESTOR ) 20 MG tablet TAKE 1 TABLET BY MOUTH DAILY 90 tablet 2   Semaglutide ,0.25 or 0.5MG /DOS, (OZEMPIC , 0.25 OR 0.5 MG/DOSE,) 2 MG/3ML SOPN Inject 0.5 mg into the skin every Sunday.     valsartan  (DIOVAN ) 160 MG tablet Take 1 tablet (160 mg total) by mouth daily. 90 tablet 3   No facility-administered medications prior to visit.   Review of Systems  Review of Systems  Constitutional: Negative.   HENT: Negative.    Respiratory:  Negative for chest tightness, shortness of breath and wheezing.   Cardiovascular: Negative.    Physical Exam  There were no vitals taken for this visit. Physical Exam Constitutional:       General: She is not in acute distress.    Appearance: Normal appearance. She is not ill-appearing.  HENT:     Head: Normocephalic and atraumatic.     Mouth/Throat:     Pharynx: Oropharynx is clear.  Cardiovascular:     Rate and Rhythm: Normal rate and regular rhythm.  Pulmonary:     Effort: Pulmonary effort is normal.     Breath sounds: Normal breath sounds. No wheezing, rhonchi or rales.     Comments: 2L oxygen  Musculoskeletal:        General: Normal range of motion.  Neurological:     General: No focal deficit present.     Mental Status: She is alert and oriented to person, place, and time. Mental status is at baseline.  Psychiatric:        Mood and Affect: Mood normal.        Behavior: Behavior normal.        Thought  Content: Thought content normal.        Judgment: Judgment normal.      Lab Results:  CBC    Component Value Date/Time   WBC 9.8 06/18/2023 0244   RBC 4.32 06/18/2023 0244   HGB 11.7 (L) 06/18/2023 0244   HGB 12.8 01/21/2023 0950   HCT 37.8 06/18/2023 0244   HCT 41.2 01/21/2023 0950   PLT 331 06/18/2023 0244   PLT 227 01/21/2023 0950   MCV 87.5 06/18/2023 0244   MCV 85 01/21/2023 0950   MCH 27.1 06/18/2023 0244   MCHC 31.0 06/18/2023 0244   RDW 14.8 06/18/2023 0244   RDW 12.9 01/21/2023 0950   LYMPHSABS 2.2 06/01/2023 0928   LYMPHSABS 3.2 (H) 01/21/2023 0950   MONOABS 0.9 06/01/2023 0928   EOSABS 0.4 06/01/2023 0928   EOSABS 0.1 01/21/2023 0950   BASOSABS 0.0 06/01/2023 0928   BASOSABS 0.0 01/21/2023 0950    BMET    Component Value Date/Time   NA 135 06/18/2023 0244   NA 143 01/21/2023 0950   K 4.3 06/18/2023 0244   CL 103 06/18/2023 0244   CO2 20 (L) 06/18/2023 0244   GLUCOSE 101 (H) 06/18/2023 0244   BUN 15 06/18/2023 0244   BUN 19 01/21/2023 0950   CREATININE 0.72 06/18/2023 0244   CREATININE 0.76 02/13/2020 1008   CALCIUM  9.3 06/18/2023 0244   GFRNONAA >60 06/18/2023 0244   GFRAA 100 08/16/2020 0748    BNP    Component Value  Date/Time   BNP 76.5 06/15/2023 1352    ProBNP    Component Value Date/Time   PROBNP 88.0 04/05/2020 1020    Imaging: CT Angio Chest PE W/Cm &/Or Wo Cm Result Date: 06/15/2023 CLINICAL DATA:  Cough and hypoxia EXAM: CT ANGIOGRAPHY CHEST WITH CONTRAST TECHNIQUE: Multidetector CT imaging of the chest was performed using the standard protocol during bolus administration of intravenous contrast. Multiplanar CT image reconstructions and MIPs were obtained to evaluate the vascular anatomy. RADIATION DOSE REDUCTION: This exam was performed according to the departmental dose-optimization program which includes automated exposure control, adjustment of the mA and/or kV according to patient size and/or use of iterative reconstruction technique. CONTRAST:  75mL OMNIPAQUE  IOHEXOL  350 MG/ML SOLN COMPARISON:  Same-day radiograph and CT 01/29/2023 FINDINGS: Cardiovascular: Negative for acute pulmonary embolism. Normal caliber thoracic aorta. No pericardial effusion. Coronary artery and aortic atherosclerotic calcification. Mediastinum/Nodes: Trachea and esophagus are unremarkable. New mediastinal and hilar lymphadenopathy. For example a right hilar node measures 1.0 cm on series 5/image 109. A right paratracheal node measures 1.1 cm on series 5/image 69. Lungs/Pleura: Patchy bilateral ground-glass opacities and centrilobular micro nodules with more confluence airspace opacities in the lower lobes. Peripheral peribronchovascular reticular opacities compatible with interstitial lung disease. No pleural effusion or pneumothorax. Upper Abdomen: No acute abnormality. Musculoskeletal: No acute fracture. Review of the MIP images confirms the above findings. IMPRESSION: 1. Negative for acute pulmonary embolism. 2. Pulmonary findings are consistent with multifocal pneumonia superimposed on a background of interstitial lung disease. 3. New mediastinal and hilar lymphadenopathy, likely reactive. Aortic Atherosclerosis  (ICD10-I70.0). Electronically Signed   By: Norman Gatlin M.D.   On: 06/15/2023 21:12   DG Chest 2 View Result Date: 06/15/2023 CLINICAL DATA:  Shortness of breath.  Productive cough. EXAM: CHEST - 2 VIEW COMPARISON:  Chest radiographs 01/14/2023, 11/06/2021 05/06/2020, 04/05/2020 FINDINGS: Cardiac silhouette is at the upper limits of normal size. Mediastinal contours are within limits. Moderate calcification within the aortic arch. Interval increase in now moderate  diffuse bilateral interstitial thickening. This is superimposed on additional chronic mild moderate interstitial thickening at baseline suggesting interstitial lung disease. No acute buckle airspace opacity. No pleural effusion pneumothorax. Mild dextrocurvature of the midthoracic spine. Mild-to-moderate multilevel degenerative disc changes of the thoracic spine. IMPRESSION: 1. Interval increase in now moderate diffuse bilateral interstitial thickening. Findings may represent interstitial pulmonary edema or atypical infection superimposed on chronic interstitial lung disease. 2. Otherwise, no focal acute airspace opacity. Electronically Signed   By: Tanda Lyons M.D.   On: 06/15/2023 16:02     Assessment & Plan:   1. IPF (idiopathic pulmonary fibrosis) (HCC) (Primary) - Hepatic function panel; Future - DG Chest 2 View; Future  2. Recurrent UTI  3. Acute respiratory failure with hypoxia (HCC)    Interstitial Lung Disease Patient was previously on Pirfenidone  (Esbriet ) but stopped due to elevated liver function tests and side effects including dizziness and disorientation. Admitted in early December for IPF flare vs pneumonia. Completed course of Augmentin  and prednisone . No acute respiratory symptoms today  -Check liver function tests today and repeat CXR  -If liver function tests are normal, restart Pirfenidone  at a lower dose (2 tablets three times a day). -Continue to monitor liver function tests closely.  Acute  respiratory  failure Patient was discharged with home oxygen  in December 2024 following admission for IPF flare. Oxyen level at rest today was 95-96% on room air, she was able to maintain O2 level >90% with ambulation. Continue 2l supplement oxygen  to moderate-heavy exertion and at bedtime. Re-challenge at follow-up.   Urinary Tract Infection (UTI) Prevention Patient has a history of UTIs and was on prophylactic Keflex , but stopped due to potential interaction with Pirfenidone . Patient reports symptoms suggestive of a UTI. -Resume Keflex  for UTI prevention as patient is currently off Pirfenidone . -Consult with pharmacist regarding the severity of the interaction between Keflex  and Pirfenidone . -Consider changing the antibiotic for UTI prevention if necessary, options include Cipro or Bactrim .  Dr. Glendia MacDiarmid with [facility]  [phone]  Follow-up in 8-12 weeks depending on the management of the medication.     Amy LELON Ferrari, NP 07/05/2023

## 2023-07-06 ENCOUNTER — Other Ambulatory Visit: Payer: Self-pay

## 2023-07-06 ENCOUNTER — Telehealth: Payer: Self-pay

## 2023-07-06 LAB — HEPATIC FUNCTION PANEL
ALT: 35 U/L (ref 0–35)
AST: 15 U/L (ref 0–37)
Albumin: 3.6 g/dL (ref 3.5–5.2)
Alkaline Phosphatase: 99 U/L (ref 39–117)
Bilirubin, Direct: 0.1 mg/dL (ref 0.0–0.3)
Total Bilirubin: 0.4 mg/dL (ref 0.2–1.2)
Total Protein: 6.5 g/dL (ref 6.0–8.3)

## 2023-07-06 NOTE — Telephone Encounter (Signed)
 Placing paper in [employer].

## 2023-07-06 NOTE — Patient Outreach (Signed)
 Care Management  Transitions of Care Program Transitions of Care Post-discharge week 3   07/06/2023 Name: Amy Escobar MRN: [id number] DOB: [birth year]  Subjective: Amy Escobar is a 78 y.o. year old female who is a primary care patient of Jason Leita Repine, FNP. The Care Management team Engaged with patient Engaged with patient by telephone to assess and address transitions of care needs.   Consent to Services:  Patient was given information about care management services, agreed to services, and gave verbal consent to participate.   Assessment:           SDOH Interventions    Flowsheet Row Clinical Support from 12/28/2022 in [facility] Documentation from 07/31/2022 in [facility] Clinical Support from 07/17/2020 in [facility] Office Visit from 03/31/2019 in [facility]  SDOH Interventions      Food Insecurity Interventions -- Intervention Not Indicated Intervention Not Indicated --  Housing Interventions -- Intervention Not Indicated Intervention Not Indicated --  Transportation Interventions -- Intervention Not Indicated Intervention Not Indicated --  Utilities Interventions -- Intervention Not Indicated -- --  Alcohol Usage Interventions Intervention Not Indicated (Score <7) -- -- --  Depression Interventions/Treatment  -- -- -- PHQ2-9 Score <4 Follow-up Not Indicated  Financial Strain Interventions Intervention Not Indicated -- Intervention Not Indicated --  Physical Activity Interventions Intervention Not Indicated -- Intervention Not Indicated --  Stress Interventions Intervention Not Indicated -- Intervention Not Indicated --  Social Connections Interventions Intervention Not Indicated -- Intervention Not Indicated --        Goals Addressed             This Visit's Progress    Transition of Care       Current Barriers:   Knowledge Deficits related to plan of care for management of Pulmonary Disease , Interstitial Lung Disease abbreviated ILD  RNCM Clinical Goal(s):  Patient will work with the Care Management team over the next 30 days to address Transition of Care Barriers: Medication access Medication Management Diet/Nutrition/Food Resources Support at home Provider appointments Home Health services Equipment/DME Functional/Safety Transportation And new need for Oxygen  Therapy 24/7  through collaboration with Medical Illustrator, provider, and care team.   Interventions: Evaluation of current treatment plan related to  self management and patient's adherence to plan as established by provider 06/29/23 Completion of TOC Program Adult Health Management including review of cardiovascular, respiratory, endocrine, gastrointestinal, neurological, and musculoskeletal health management needs 07/06/23 Update - no change in health status from previous week. Reviewed/reconciled meds. Continues with HHS with RN/PT/OT and starting receiving meals delivered to her home thru [facility]  Transitions of Care:  Ongoing Doctor Visits  - discussed the importance of doctor visits Referral to Longitudinal Nurse Case Manager for Ongoing follow-up Contacted provider for patient needs 06/21/23 Called and spoke to PCP [name] staff member [name] requesting an order for [facility] Evaluation as patient appears to qualify for Physical Therapy and possibly HomeCare Assistant due to patient: lives alone, exhibits declining functional status since hospitalized for Acute Respiratory Failure with hypoxia, and now requires use of oxygen  via nasal cannula at home 06/29/23 Update : Patient has been approved for Home health Services - RN/PT/OT and home visits started this past week.  Patient Goals/Self-Care Activities: Participate in Transition of Care Program/Attend TOC scheduled calls Take all medications as  prescribed Attend all scheduled provider appointments  Call pharmacy for medication refills 3-7 days in advance of running out of medications Call provider office for new concerns or questions   Follow Up Plan:  The patient has been provided with contact information for the care management team and has been advised to call with any health related questions or concerns.  Next telephone appointment with [facility] RN is scheduled for 07/13/23 @ 1pm.        Plan: The patient has been provided with contact information for the care management team and has been advised to call with any health related questions or concerns.   Channing Larry, RN, BA, [facility], CRRN [facility] Coordinator, Transition of Care Ph # [phone]

## 2023-07-06 NOTE — Patient Instructions (Signed)
 Visit Information  Thank you for taking time to visit with me today. Please don't hesitate to contact me if I can be of assistance to you before our next scheduled telephone appointment.  Our next appointment is by telephone on 07/13/23 at 1pm.  Following is a copy of your care plan:   Goals Addressed             This Visit's Progress    Transition of Care       Current Barriers:  Knowledge Deficits related to plan of care for management of Pulmonary Disease , Interstitial Lung Disease abbreviated ILD  RNCM Clinical Goal(s):  Patient will work with the Care Management team over the next 30 days to address Transition of Care Barriers: Medication access Medication Management Diet/Nutrition/Food Resources Support at home Provider appointments Home Health services Equipment/DME Functional/Safety Transportation And new need for Oxygen  Therapy 24/7  through collaboration with Medical Illustrator, provider, and care team.   Interventions: Evaluation of current treatment plan related to  self management and patient's adherence to plan as established by provider 06/29/23 Completion of TOC Program Adult Health Management including review of cardiovascular, respiratory, endocrine, gastrointestinal, neurological, and musculoskeletal health management needs 07/06/23 Update - no change in health status from previous week. Reviewed/reconciled meds. Continues with HHS with RN/PT/OT and starting receiving meals delivered to her home thru [facility]  Transitions of Care:  Ongoing Doctor Visits  - discussed the importance of doctor visits Referral to Longitudinal Nurse Case Manager for Ongoing follow-up Contacted provider for patient needs 06/21/23 Called and spoke to PCP [name] staff member [name] requesting an order for [facility] Evaluation as patient appears to qualify for Physical Therapy and possibly HomeCare Assistant due to patient: lives alone, exhibits  declining functional status since hospitalized for Acute Respiratory Failure with hypoxia, and now requires use of oxygen  via nasal cannula at home 06/29/23 Update : Patient has been approved for Home health Services - RN/PT/OT and home visits started this past week.  Patient Goals/Self-Care Activities: Participate in Transition of Care Program/Attend TOC scheduled calls Take all medications as prescribed Attend all scheduled provider appointments Call pharmacy for medication refills 3-7 days in advance of running out of medications Call provider office for new concerns or questions   Follow Up Plan:  The patient has been provided with contact information for the care management team and has been advised to call with any health related questions or concerns.  Next telephone appointment with [facility] RN is scheduled for 07/13/23 @ 1pm.        Patient verbalizes understanding of instructions and care plan provided today and agrees to view in MyChart. Active MyChart status and patient understanding of how to access instructions and care plan via MyChart confirmed with patient.     The patient has been provided with contact information for the care management team and has been advised to call with any health related questions or concerns.   Please call the care guide team at [phone] if you need to cancel or reschedule your appointment.   Please call 1-800-273-TALK (toll free, 24 hour hotline) if you are experiencing a Mental Health or Behavioral Health Crisis or need someone to talk to.  Channing Larry, RN, BA, [facility], CRRN [facility] Coordinator, Transition of Care Ph # [phone]

## 2023-07-07 NOTE — Telephone Encounter (Signed)
 Spoke with patient.  Reviewed all information per [facility].  Reminded patient to get LFTs in 1 month.  Patient verbalized understanding.

## 2023-07-07 NOTE — Addendum Note (Signed)
 Addended by: Glenford Bayley on: 07/07/2023 11:33 AM   Modules accepted: Orders

## 2023-07-07 NOTE — Telephone Encounter (Signed)
 Received paperwork from patient.  This form is just going over basic information about pirfenidone  (Esbriet ). Discussed with pharmacy and there is no interaction with Keflex .  She can continue to take antibiotics for UTI.   Her liver function is normal. Restart Esbriet  1 tab three times a day x 1 week; then increase 2 tablets three times a day and stay on this dose. Needs LFTs in 1 month (I have ordered).

## 2023-07-08 ENCOUNTER — Other Ambulatory Visit: Payer: Self-pay | Admitting: Internal Medicine

## 2023-07-08 DIAGNOSIS — E118 Type 2 diabetes mellitus with unspecified complications: Secondary | ICD-10-CM

## 2023-07-09 ENCOUNTER — Other Ambulatory Visit: Payer: Self-pay | Admitting: Family

## 2023-07-13 ENCOUNTER — Other Ambulatory Visit: Payer: Self-pay | Admitting: Internal Medicine

## 2023-07-13 ENCOUNTER — Telehealth: Payer: Self-pay

## 2023-07-13 ENCOUNTER — Other Ambulatory Visit: Payer: Medicare Other

## 2023-07-14 NOTE — Patient Instructions (Signed)
 Visit Information  Dear Amy Escobar,  Thank you for taking time to visit with me today. Please don't hesitate to contact me if I can be of assistance to you before our next scheduled telephone appointment.  It has been both a pleasure and privilege to partner with you ensuring an optimal transition of care of your respiratory symptoms at home, after you were discharged from the hospital. Your positive attitude and commitment to maintaining your health regimen will greatly assist you in reaching your goal to avoid future hospitalizations.  Best wishes & Warm regards,  Bartholomew Light     Following is a copy of your care plan:   Goals Addressed             This Visit's Progress    Transition of Care       Current Barriers:  Knowledge Deficits related to plan of care for management of Pulmonary Disease , Interstitial Lung Disease abbreviated "ILD"  RNCM Clinical Goal(s):  Patient will work with the Care Management team over the next 30 days to address Transition of Care Barriers: Medication access Medication Management Diet/Nutrition/Food Resources Support at home Provider appointments Home Health services Equipment/DME Functional/Safety Transportation And new need for Oxygen Therapy 24/7  through collaboration with RN Care manager, provider, and care team.   Interventions: Evaluation of current treatment plan related to  self management and patient's adherence to plan as established by provider 06/29/23 Completion of TOC Program "Adult Health Management" including review of cardiovascular, respiratory, endocrine, gastrointestinal, neurological, and musculoskeletal health management needs 07/06/23 Update - no change in health status from previous week. Reviewed/reconciled meds. Continues with HHS with RN/PT/OT and starting receiving meals delivered to her home thru [facility]  Transitions of Care:  Ongoing Doctor Visits  - discussed the importance of doctor visits Referral to Longitudinal  Nurse Case Manager for Ongoing follow-up Contacted provider for patient needs 06/21/23 Called and spoke to PCP Honora Lutes Murray's staff member "Camilo Cella" requesting an order for Home Health Services Evaluation as patient appears to qualify for Physical Therapy and possibly HomeCare Assistant due to patient: lives alone, exhibits declining functional status since hospitalized for Acute Respiratory Failure with hypoxia, and now requires use of oxygen via nasal cannula at home 06/29/23 Update : Patient has been approved for [facility] - RN/PT/OT and home visits started this past week.  Patient Goals/Self-Care Activities: Participate in Transition of Care Program/Attend TOC scheduled calls Take all medications as prescribed Attend all scheduled provider appointments Call pharmacy for medication refills 3-7 days in advance of running out of medications Call provider office for new concerns or questions   Follow Up Plan:  The patient has been provided with contact information for the care management team and has been advised to call with any health related questions or concerns.  Patient declined offer of continued care management by Longitudinal CCM RN, feels she is handling her healthcare very well.        Patient verbalizes understanding of instructions and care plan provided today and agrees to view in MyChart. Active MyChart status and patient understanding of how to access instructions and care plan via MyChart confirmed with patient.     The patient has been provided with contact information for the care management team and has been advised to call with any health related questions or concerns.   Please call the care guide team at [phone] if you need to cancel or reschedule your appointment.   Please call 1-800-273-TALK (toll free, 24  hour hotline) if you are experiencing a Mental Health or Behavioral Health Crisis or need someone to talk to.  Amy Cull, RN, BA,  [facility], CRRN [facility] Coordinator, Transition of Care Ph # [phone]

## 2023-07-14 NOTE — Patient Outreach (Signed)
 Care Management  Transitions of Care Program Transitions of Care Post-discharge week 4   07/14/2023 Name: Amy Escobar MRN: [id number] DOB: April 14, 1946  Subjective: Amy Escobar is a 78 y.o. year old female who is a primary care patient of Adra Alanis, FNP. The Care Management team Engaged with patient Engaged with patient by telephone to assess and address transitions of care needs.   Consent to Services:  Patient was given information about care management services, agreed to services, and gave verbal consent to participate.   Assessment:           SDOH Interventions    Flowsheet Row Clinical Support from 12/28/2022 in [facility] at [facility] Documentation from 07/31/2022 in [facility] Clinical Support from 07/17/2020 in [facility] at [facility] Office Visit from 03/31/2019 in [facility]  SDOH Interventions      Food Insecurity Interventions -- Intervention Not Indicated Intervention Not Indicated --  Housing Interventions -- Intervention Not Indicated Intervention Not Indicated --  Transportation Interventions -- Intervention Not Indicated Intervention Not Indicated --  Utilities Interventions -- Intervention Not Indicated -- --  Alcohol Usage Interventions Intervention Not Indicated (Score <7) -- -- --  Depression Interventions/Treatment  -- -- -- PHQ2-9 Score <4 Follow-up Not Indicated  Financial Strain Interventions Intervention Not Indicated -- Intervention Not Indicated --  Physical Activity Interventions Intervention Not Indicated -- Intervention Not Indicated --  Stress Interventions Intervention Not Indicated -- Intervention Not Indicated --  Social Connections Interventions Intervention Not Indicated -- Intervention Not Indicated --        Goals Addressed             This Visit's Progress    Transition of Care       Current Barriers:   Knowledge Deficits related to plan of care for management of Pulmonary Disease , Interstitial Lung Disease abbreviated "ILD"  RNCM Clinical Goal(s):  Patient will work with the Care Management team over the next 30 days to address Transition of Care Barriers: Medication access Medication Management Diet/Nutrition/Food Resources Support at home Provider appointments Home Health services Equipment/DME Functional/Safety Transportation And new need for Oxygen Therapy 24/7  through collaboration with RN Care manager, provider, and care team.   Interventions: Evaluation of current treatment plan related to  self management and patient's adherence to plan as established by provider 06/29/23 Completion of TOC Program "Adult Health Management" including review of cardiovascular, respiratory, endocrine, gastrointestinal, neurological, and musculoskeletal health management needs 07/06/23 Update - no change in health status from previous week. Reviewed/reconciled meds. Continues with HHS with RN/PT/OT and starting receiving meals delivered to her home thru [facility]  Transitions of Care:  Ongoing Doctor Visits  - discussed the importance of doctor visits Referral to Longitudinal Nurse Case Manager for Ongoing follow-up Contacted provider for patient needs 06/21/23 Called and spoke to PCP Honora Lutes Murray's staff member "Camilo Cella" requesting an order for Home Health Services Evaluation as patient appears to qualify for Physical Therapy and possibly HomeCare Assistant due to patient: lives alone, exhibits declining functional status since hospitalized for Acute Respiratory Failure with hypoxia, and now requires use of oxygen via nasal cannula at home 06/29/23 Update : Patient has been approved for [facility] - RN/PT/OT and home visits started this past week.  Patient Goals/Self-Care Activities: Participate in Transition of Care Program/Attend TOC scheduled calls Take all medications as  prescribed Attend all scheduled provider appointments  Call pharmacy for medication refills 3-7 days in advance of running out of medications Call provider office for new concerns or questions   Follow Up Plan:  The patient has been provided with contact information for the care management team and has been advised to call with any health related questions or concerns.  Patient declined offer of continued care management by Longitudinal CCM RN, feels she is handling her healthcare very well.        Plan: The patient has been provided with contact information for the care management team and has been advised to call with any health related questions or concerns.   Santina Cull, RN, BA, [facility], CRRN [facility] Coordinator, Transition of Care Ph # [phone]

## 2023-07-15 ENCOUNTER — Other Ambulatory Visit (HOSPITAL_COMMUNITY): Payer: Self-pay | Admitting: Pharmacy Technician

## 2023-07-15 ENCOUNTER — Other Ambulatory Visit (HOSPITAL_COMMUNITY): Payer: Self-pay

## 2023-07-15 ENCOUNTER — Telehealth: Payer: Self-pay | Admitting: Primary Care

## 2023-07-15 ENCOUNTER — Other Ambulatory Visit: Payer: Self-pay

## 2023-07-15 DIAGNOSIS — J84112 Idiopathic pulmonary fibrosis: Secondary | ICD-10-CM

## 2023-07-15 MED ORDER — PIRFENIDONE 267 MG PO TABS
534.0000 mg | ORAL_TABLET | Freq: Three times a day (TID) | ORAL | 0 refills | Status: DC
  Filled 2023-07-15: qty 180, 30d supply, fill #0
  Filled 2023-08-17: qty 180, 30d supply, fill #1
  Filled 2023-09-17: qty 180, 30d supply, fill #2

## 2023-07-15 NOTE — Telephone Encounter (Signed)
Patient needs clarification on how to take her medicine pirfenidone. Please call and advise [phone]

## 2023-07-15 NOTE — Telephone Encounter (Signed)
Spoke with patient. She has been taking 1 tablet three times day x 1 week now. Advised her to increase to 2 tablets three times daily and stay at that dose. She has enough medication right now for next 2 weeks. She read back directions to me  Refills sent to [facility]  Chesley Mires, PharmD, MPH, BCPS, CPP Clinical Pharmacist (Rheumatology and Pulmonology)

## 2023-07-15 NOTE — Progress Notes (Signed)
Specialty Pharmacy Refill Coordination Note  Amy Escobar is a 78 y.o. female contacted today regarding refills of specialty medication(s) Pirfenidone   Patient requested Delivery   Delivery date: 07/27/23   Verified address: [address] [place]   Medication will be filled on 07/26/23.

## 2023-07-15 NOTE — Telephone Encounter (Signed)
I called and spoke to the pt. Pt stated she wants to know the instructions of how to take her Pirfenidone. I did not see this medication in her med list, but, she was last seen by Waynetta Sandy, Np on 07-05-23 which the lov stated if her Liver Function tests came back normal, then this would be restarted at a lower dose. ( 2 tablets TID) Please advise pt on how to correctly take this rx.

## 2023-07-17 ENCOUNTER — Telehealth: Payer: Self-pay | Admitting: Pulmonary Disease

## 2023-07-17 DIAGNOSIS — R053 Chronic cough: Secondary | ICD-10-CM

## 2023-07-17 DIAGNOSIS — J84112 Idiopathic pulmonary fibrosis: Secondary | ICD-10-CM

## 2023-07-17 DIAGNOSIS — Z5181 Encounter for therapeutic drug level monitoring: Secondary | ICD-10-CM

## 2023-07-17 MED ORDER — BENZONATATE 200 MG PO CAPS: 200.0000 mg | ORAL_CAPSULE | Freq: Three times a day (TID) | ORAL | 1 refills | Status: DC | PRN

## 2023-07-17 NOTE — Telephone Encounter (Signed)
Weekend Call Note  Reports increasing cough and mucous production over recent days/weeks. She has episodes where she gets choked up on her mucous. She denies fever or chills. Denies sick contacts. Denies post nasal drainage. She does have GERD and sleeps on wedge pillow and using nexium.   Plan - start tessalon perles as needed for cough - recommend getting her setup for nebulizer machine and solution along with flutter valve to help with airway clearance. She is to discuss this further with Buelah Manis, NP and Dr. Marchelle Gearing.  Melody Comas, MD [facility] Office: [phone]   See Amion for personal pager PCCM on call pager [phone] until 7pm. Please call Elink 7p-7a. [phone]

## 2023-07-24 ENCOUNTER — Other Ambulatory Visit: Payer: Self-pay | Admitting: Family

## 2023-07-24 ENCOUNTER — Other Ambulatory Visit: Payer: Self-pay | Admitting: Physician Assistant

## 2023-07-24 DIAGNOSIS — R058 Other specified cough: Secondary | ICD-10-CM

## 2023-07-26 ENCOUNTER — Other Ambulatory Visit: Payer: Self-pay

## 2023-07-26 NOTE — Telephone Encounter (Signed)
She had called in over the weekend and spoke to Dr. Francine Graven because of increased cough.  Plan - Please do call her and see if the cough is still at an elevated state despite Tessalon Perles and if so I recommend short course prednisone - Take prednisone 40 mg daily x 2 days, then 20mg  daily x 2 days, then 10mg  daily x 2 days, then 5mg  daily x 2 days and stop  -Please note that I am not ordered the prednisone because I am on night shift and also I do not know if she has been agree to take the prednisone.  Therefore if she agrees then triage you need to order this prednisone  -Recheck liver function test next week [appointment still 2 months away].  Is because she restarted.  Esbriet

## 2023-07-27 ENCOUNTER — Telehealth: Payer: Self-pay | Admitting: Pulmonary Disease

## 2023-07-27 NOTE — Telephone Encounter (Signed)
Amy Escobar; Amy Escobar; Amy Escobar Dx codes on the RX are Invalid DX based on Medicare Guidelines.

## 2023-07-27 NOTE — Telephone Encounter (Signed)
What Rx and what diagnosis? Can yo uplease clarify

## 2023-07-27 NOTE — Telephone Encounter (Signed)
Called and spoke with patient, she states that her cough is better since using the Tessalon.  She does not feel like she needs the Prednisone taper at this time.  She does want to have have the flutter valve and nebulizer machine order sent in.  Dr. Marchelle Gearing, what solution do you want sent in to be used int  he nebulizer machine.  She will come by the office and have the liver function test done as she is taking th Esbriet, 2 tablets 3 times per day.  She is currently on Advair, however, insurance will not pay for Advair going forward, they will cover Symbicort.  She will finish the Advair she currently has and she is ok trying the Symbicort to see if this is comparable to the Advair.  Please advise if you want the Symbicort ordered and if so, what strength.  Thank you.

## 2023-07-28 NOTE — Telephone Encounter (Signed)
Routing to [employer] for clarification.

## 2023-07-29 NOTE — Telephone Encounter (Signed)
Dr. Marchelle Gearing, please advise.

## 2023-08-05 ENCOUNTER — Ambulatory Visit: Payer: 59 | Admitting: Allergy

## 2023-08-06 ENCOUNTER — Telehealth: Payer: Self-pay | Admitting: Internal Medicine

## 2023-08-06 LAB — HEPATIC FUNCTION PANEL
ALT: 19 U/L (ref 0–35)
AST: 21 U/L (ref 0–37)
Albumin: 3.9 g/dL (ref 3.5–5.2)
Alkaline Phosphatase: 86 U/L (ref 39–117)
Bilirubin, Direct: 0.1 mg/dL (ref 0.0–0.3)
Total Bilirubin: 0.4 mg/dL (ref 0.2–1.2)
Total Protein: 7.2 g/dL (ref 6.0–8.3)

## 2023-08-06 NOTE — Telephone Encounter (Signed)
 PT states she called earlier about a cough and they told her it was normal. She presented to the front desk and wanted to speak to another nurse.  Also the meds we called in were an incorrect dosage.  fesoterodine  (TOVIAZ )   We sent in 4mg  and she states it s/b 800 mg. Please call to adise on this as well.  Pharm is [facility] but we sent it to [facility].   Her # is [phone] (Call after 3:00)

## 2023-08-06 NOTE — Telephone Encounter (Signed)
 Toviaz  is a drug used for bladder problems- we did not prescribe this   Called the pt and there was no answer- LMTCB.

## 2023-08-11 NOTE — Telephone Encounter (Signed)
Called and spoke to patient.  She stated that she did not request a refill for Toviaz.  She stated that she was started on ofev in place of Esbriet, however [name] pharmacy sent Esbriet instead of ofev.  Pharmacy team, can you assist with this?   She also requested lab results. Results have been relayed to patient as below.   Amy Escobar, Please let patient know it is normal result

## 2023-08-11 NOTE — Telephone Encounter (Signed)
MR, please see below message and advise. Thanks ?

## 2023-08-11 NOTE — Telephone Encounter (Signed)
There are no notes about patient initiating Ofev even after last visit with Buelah Manis, NP  Dr. Jane Canary note clearly states pirfenidone per protocol. This will be something that needs to be deferred to Dr. Marchelle Gearing  I talked to pt on 07/15/2023 and she read back directions

## 2023-08-12 NOTE — Telephone Encounter (Signed)
I spoke with the pt  She is aware should be taking pirfenidone and not ofev  She is wanting to verify dose of pirfenidone with pharmacy  She says she is confused about whether or not she is supposed to be taking 801 mg and how many times a day  Routing to [employer]

## 2023-08-12 NOTE — Telephone Encounter (Signed)
Patient advised to take pirfenidone TWO TABLETS THREE TIMES DAIL (534mg  three times daily)  She states she was having side effects at low dose but again notes do not correlate with this. There is some obvious confusion and/or miscommunication. Advised to stay at low dose and we can re-assess increasing in future.  Chesley Mires, PharmD, MPH, BCPS, CPP Clinical Pharmacist (Rheumatology and Pulmonology)

## 2023-08-12 NOTE — Telephone Encounter (Signed)
Albuterol was not given by me.  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  Prior to that it was discontinued while she was in the hospital according to the surgeon I did in epic.  Can you please ask her how long she has been using albuterol and if it is nebulizer or MDI?  And who originally prescribed this

## 2023-08-12 NOTE — Telephone Encounter (Signed)
My noes oct 2024 gave her 2 choices - and she went with esbreit. No one else has changed that

## 2023-08-13 NOTE — Telephone Encounter (Signed)
Amy Escobar,   Can you ask DME what code is covered?  I do not know what to ask Dr. Marchelle Gearing?   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  Thank you!

## 2023-08-13 NOTE — Telephone Encounter (Signed)
Murali,  Please see weekend call note from patient when I was on call at [facility]. I had recommended discussing with you and Beth regarding a nebulizer machine for neb treatments along with flutter valve for air way clearance based on her complaints at the time when she called.  Thanks, Cletis Athens

## 2023-08-13 NOTE — Telephone Encounter (Signed)
Referral for nebulizer was placed by Dr. Francine Graven. [facility] please check into why dx used not sufficient.   Also I don't see that any meds were sent to pharmacy for nebulizer per order.

## 2023-08-13 NOTE — Telephone Encounter (Signed)
Insurance will not cover under these dx codes

## 2023-08-13 NOTE — Telephone Encounter (Signed)
Message sent to [employer]

## 2023-08-16 NOTE — Telephone Encounter (Signed)
Amy Escobar; Amy Escobar Received, thank you       Previous Messages    ----- Message ----- From: Darius Bump Sent: 08/13/2023   8:35 PM EST To: Alain Honey; Kathe Becton; * Subject: Nebulizer                                      August 21, 1945 What code is covered for a Nebulizer the nurse is asking me. Thanks

## 2023-08-17 ENCOUNTER — Other Ambulatory Visit: Payer: Self-pay

## 2023-08-17 NOTE — Progress Notes (Signed)
Specialty Pharmacy Refill Coordination Note  Amy Escobar is a 78 y.o. female contacted today regarding refills of specialty medication(s) Pirfenidone   Patient requested Delivery   Delivery date: 08/30/23   Verified address: [address] [place]   Medication will be filled on 08/27/23.

## 2023-08-25 ENCOUNTER — Ambulatory Visit: Payer: 59 | Admitting: Allergy

## 2023-08-27 ENCOUNTER — Ambulatory Visit: Payer: Self-pay | Admitting: Internal Medicine

## 2023-08-27 ENCOUNTER — Other Ambulatory Visit: Payer: Self-pay

## 2023-08-27 NOTE — Telephone Encounter (Addendum)
 Chief Complaint: cough Symptoms: chest soreness L>R Frequency: x 1 week Pertinent Negatives: Patient denies fever, CP, SOB Disposition: [] ED /[x] Urgent Care (no appt availability in office) / [] Appointment(In office/virtual)/ []  [facility]/ [] Home Care/ [x] Refused Recommended Disposition /[]  Mobile Bus/ [x]  Follow-up with PCP Additional Notes: Pt c/o of chest soreness L > R r/t productive coughing x 1 week. Denies fever, SOB, CP. Pt has hx of ILD and wants guidance from pulmonologist on medications she can take/if needing Rx. Of note, advised OTC tylenol for soreness and pt stated she will also use lidocaine patch since it has helped in the past. Pt states she has concerns for cough turning into PNA. Triager will forward encounter for Dr. Marchelle Gearing to review and advise. Patient verbalized understanding and is expecting call back from office for next steps. Triager also reinforced that if pt does not hear back from office, to go to UC to seek evaluation d/t no access at pulm office.   Reason for Disposition  [1] Known COPD or other severe lung disease (i.e., bronchiectasis, cystic fibrosis, lung surgery) AND [2] worsening symptoms (i.e., increased sputum purulence or amount, increased breathing difficulty  Answer Assessment - Initial Assessment Questions E2C2 Pulmonary Triage - Initial Assessment Questions "Chief Complaint (e.g., cough, sob, wheezing, fever, chills, sweat or additional symptoms) *Go to specific symptom protocol after initial questions. cough  "How long have symptoms been present?" A week ago  Have you tested for COVID or Flu? Note: If not, ask patient if a home test can be taken. If so, instruct patient to call back for positive results. No  MEDICINES:   "Have you used any OTC meds to help with symptoms?" No If yes, ask "What medications?" N/a  "Have you used your inhalers/maintenance medication?" No If yes, "What medications?" N/a  If  inhaler, ask "How many puffs and how often?" Note: Review instructions on medication in the chart. N/a  OXYGEN: "Do you wear supplemental oxygen?" Yes If yes, "How many liters are you supposed to use?" 2L at night only  "Do you monitor your oxygen levels?" Yes If yes, "What is your reading (oxygen level) today?" 95  "What is your usual oxygen saturation reading?"  (Note: Pulmonary O2 sats should be 90% or greater) >95%   1. ONSET: "When did the cough begin?"      A week 2. SEVERITY: "How bad is the cough today?"      Causing soreness, L > R 3. SPUTUM: "Describe the color of your sputum" (none, dry cough; clear, white, yellow, green)     clear 4. HEMOPTYSIS: "Are you coughing up any blood?" If so ask: "How much?" (flecks, streaks, tablespoons, etc.)     no 5. DIFFICULTY BREATHING: "Are you having difficulty breathing?" If Yes, ask: "How bad is it?" (e.g., mild, moderate, severe)    - MILD: No SOB at rest, mild SOB with walking, speaks normally in sentences, can lie down, no retractions, pulse < 100.    - MODERATE: SOB at rest, SOB with minimal exertion and prefers to sit, cannot lie down flat, speaks in phrases, mild retractions, audible wheezing, pulse 100-120.    - SEVERE: Very SOB at rest, speaks in single words, struggling to breathe, sitting hunched forward, retractions, pulse > 120      No, just soreness 6. FEVER: "Do you have a fever?" If Yes, ask: "What is your temperature, how was it measured, and when did it start?"     no 7. CARDIAC HISTORY: "  Do you have any history of heart disease?" (e.g., heart attack, congestive heart failure)      HTN Denies CHF 8. LUNG HISTORY: "Do you have any history of lung disease?"  (e.g., pulmonary embolus, asthma, emphysema)     ILD 9. PE RISK FACTORS: "Do you have a history of blood clots?" (or: recent major surgery, recent prolonged travel, bedridden)     denies 10. OTHER SYMPTOMS: "Do you have any other symptoms?" (e.g., runny nose,  wheezing, chest pain)       Denies, soreness from cough  Protocols used: Cough - Acute Productive-A-AH

## 2023-08-27 NOTE — Telephone Encounter (Signed)
MR, please advise. Thanks!  

## 2023-08-27 NOTE — Telephone Encounter (Signed)
 Called and spoke with patient, I verified that she did not receive her nebulizer.  Advised I would look into it and then we would call her back.  She verbalized understanding.  PCCs:  The diagnosis she has are IPF and chronic cough.  Do neither of these diagnosis qualify her for a nebulizer machine?  I see that the PCCs sent a message to [employer], however, I do not see a response.  Please advise.  Thank you.

## 2023-08-27 NOTE — Telephone Encounter (Signed)
 Spoke to patient. Sounds she like she has MSK pain left side to back with coughing. No fever . No hemoptusis. Hs clear sputum - baseline. No change.   Plan  - monitor   - if it gets worse or somehting changes go to ER - ok to put lidocaine patch on it    SIGNATURE    Dr. Kalman Shan, M.D., F.C.C.P,  Pulmonary and Critical Care Medicine Staff Physician, [facility] Director - Interstitial Lung Disease  Program  Pulmonary Fibrosis [facility] Network at [facility] [place], [place], [address]   Pager: [phone], If no answer  -> Check AMION or Try [phone] Telephone (clinical office): [phone] Telephone (research): [phone]  2:19 PM 08/27/2023

## 2023-08-30 NOTE — Telephone Encounter (Signed)
 Response from patricia at [employer] :   Hebert Soho; Percell Locus; Kathe Becton; Randie Heinz, Clovis Riley [phone] were on the order and didn't work and I found J96.01 on the patient problem list and that didn't work either.

## 2023-08-30 NOTE — Telephone Encounter (Signed)
 Called and spoke with patient, advised that we were unable to get her a nebulizer covered through insurance.  Advised her that she can purchase one on [employer] along with the tubing.  She verified understanding.  Nothing further needed.

## 2023-09-01 ENCOUNTER — Telehealth: Payer: Self-pay | Admitting: Internal Medicine

## 2023-09-01 NOTE — Telephone Encounter (Signed)
 Patient states [employer] does not cover insurance. Patient states needs Nebulizer machine. Patient states [employer] covers insurance. Patient phone number is [phone].

## 2023-09-02 NOTE — Telephone Encounter (Signed)
 This issue was already discussed with the patient on 08/30/23, advised that [employer] would not cover the Nebulizer machine with the diagnosis she has, IPF, Chronic respiratory failure with hypoxia or chronic cough.  See note below:  August 30, 2023 Me  HF   08/30/23  4:24 PM Note Called and spoke with patient, advised that we were unable to get her a nebulizer covered through insurance.  Advised her that she can purchase one on [employer] along with the tubing.  She verified understanding.  Nothing further needed.

## 2023-09-07 ENCOUNTER — Encounter: Payer: Self-pay | Admitting: Internal Medicine

## 2023-09-07 ENCOUNTER — Ambulatory Visit (INDEPENDENT_AMBULATORY_CARE_PROVIDER_SITE_OTHER): Payer: 59 | Admitting: Internal Medicine

## 2023-09-07 VITALS — BP 124/82 | HR 110 | Ht 64.5 in | Wt 158.0 lb

## 2023-09-07 DIAGNOSIS — Z7985 Long-term (current) use of injectable non-insulin antidiabetic drugs: Secondary | ICD-10-CM

## 2023-09-07 DIAGNOSIS — E1142 Type 2 diabetes mellitus with diabetic polyneuropathy: Secondary | ICD-10-CM | POA: Diagnosis not present

## 2023-09-07 LAB — POCT GLYCOSYLATED HEMOGLOBIN (HGB A1C): Hemoglobin A1C: 5.8 % — AB (ref 4.0–5.6)

## 2023-09-07 MED ORDER — OZEMPIC (0.25 OR 0.5 MG/DOSE) 2 MG/3ML ~~LOC~~ SOPN: 0.5000 mg | PEN_INJECTOR | SUBCUTANEOUS | 3 refills | Status: DC

## 2023-09-07 NOTE — Progress Notes (Signed)
 Name: Amy Escobar  Age/ Sex: 78 y.o., female   MRN/ DOB: [id number], 1946/04/22     PCP: Olive Bass, FNP   Reason for Endocrinology Evaluation: Type 2 Diabetes Mellitus  Initial Endocrine Consultative Visit: 11/25/2012    PATIENT IDENTIFIER: Amy Escobar is a 78 y.o. female with a past medical history of T2DM, HTN, non-obstructive CAD , and Dyslipidemia . The patient has followed with Endocrinology clinic since 11/25/2012 for consultative assistance with management of her diabetes.  DIABETIC HISTORY:  Amy Escobar was diagnosed with DM years ago, She has been on Metformin . Her hemoglobin A1c has ranged from 5.6% in 2021, peaking at 7.2% in 2015.  Invokana caused genital infections   Transitioned care from Dr. Everardo All 05/2021  She was started on glimepiride in September 2023 due to hyperglycemia, but this was discontinued as it caused weight gain Started on Ozempic 03/2022    SUBJECTIVE:   During the last visit (03/08/2023): A1c 5.9%     Today (09/07/2023): Amy Escobar  is here for a follow up on diabetes management. She checks her blood sugars 1 times daily. The patient has not had hypoglycemic episodes since the last clinic visit.   Patient follows with pulmonary for interstitial lung disease, she is on Pirfenidone  Continues with  tingling of  left foot, sees podiatry ( Dr. 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 Denies: CKD  Last Eye Exam: Completed 10/02/2021  Macrovascular complications:   Non-Obstructive CAD - follows with Cardiology  Denies: CVA, PVD   HISTORY:  Past Medical History:  Past Medical History:  Diagnosis Date   Alkaline phosphatase deficiency    w/u Ne   Allergic rhinitis    Allergy    Anxiety    Arthritis    Cataract    BILATERAL-REMOVED   DM2 (diabetes mellitus, type 2) (HCC)    GERD (gastroesophageal reflux disease)    Gout    Hemorrhoids    Hyperlipidemia    Hypertension    Interstitial lung disease (HCC)    Mild pulmonary hypertension (HCC)    NSVT (nonsustained ventricular tachycardia) (HCC)    Osteoporosis    PVC's (premature ventricular contractions)    Thyroid nodule    small   Tubular adenoma of colon 2017   Urticaria    Uterine fibroid    Past Surgical History:  Past Surgical History:  Procedure Laterality Date   CATARACT EXTRACTION Bilateral 2016   COLONOSCOPY  2007   echocardiogram (other)  01/16/2002   POLYPECTOMY     removed tumors from foot nerves  04/1999   stress cardiolite  02/12/2006   TOENAIL EXCISION     TUBAL LIGATION     TYMPANOSTOMY TUBE PLACEMENT     Social History:  reports that she quit smoking about 55 years ago. Her smoking use included cigarettes. She started smoking about 60 years ago. She has a 1.3 pack-year smoking history. She has never used smokeless tobacco. She reports that she does not drink alcohol and does not  use drugs. Family History:  Family History  Problem Relation Age of Onset   Heart disease Mother    Allergic rhinitis Mother    Heart disease Father    Stomach cancer Maternal Grandmother    Heart disease Maternal Grandfather    Rectal cancer Maternal Grandfather    Colon cancer Neg Hx    Breast cancer Neg Hx    Colon polyps Neg Hx    Esophageal cancer Neg Hx      HOME MEDICATIONS: Allergies as of 09/07/2023       Reactions   Benicar [olmesartan] Other (See Comments)   Headache         Medication List        Accurate as of September 07, 2023  8:39 AM. If you have any  questions, ask your nurse or doctor.          acetaminophen 500 MG tablet Commonly known as: TYLENOL Take 500 mg by mouth daily as needed for headache.   aspirin EC 81 MG tablet Take 81 mg by mouth daily.   benzonatate 200 MG capsule Commonly known as: TESSALON Take 1 capsule (200 mg total) by mouth 3 (three) times daily as needed for cough.   CALCIUM + VITAMIN D3 PO Take 1 tablet by mouth daily.   celecoxib 200 MG capsule Commonly known as: CELEBREX TAKE 1 CAPSULE BY MOUTH DAILY   cephALEXin 250 MG capsule Commonly known as: KEFLEX Take 250 mg by mouth daily.   esomeprazole 40 MG capsule Commonly known as: NEXIUM TAKE 1 CAPSULE BY MOUTH 2 TIMES A DAY BEFORE A MEAL   fesoterodine 4 MG Tb24 tablet Commonly known as: TOVIAZ Take 4 mg by mouth daily.   fluticasone-salmeterol 115-21 MCG/ACT inhaler Commonly known as: ADVAIR HFA Inhale 2 puffs into the lungs 2 (two) times daily.   gabapentin 300 MG capsule Commonly known as: NEURONTIN Take 300 mg by mouth daily as needed (neuropathy). What changed: Another medication with the same name was changed. Make sure you understand how and when to take each.   gabapentin 100 MG capsule Commonly known as: NEURONTIN Take 1 capsule (100 mg total) by mouth at bedtime. What changed: when to take this   lidocaine 5 % Commonly known as: LIDODERM 1 patch daily.   OneTouch Verio Flex System w/Device Kit USE TO TEST BLOOD SUGAR DAILY AS DIRECTED   OneTouch Verio test strip Generic drug: glucose blood USE TO CHECK BLOOD SUGAR TWO TIMES A DAY   Ozempic (0.25 or 0.5 MG/DOSE) 2 MG/3ML Sopn Generic drug: Semaglutide(0.25 or 0.5MG /DOS) DIAL AND INJECT UNDER THE SKIN 0.5 MG WEEKLY   Pirfenidone 267 MG Tabs Take 2 tablets (534 mg total) by mouth in the morning, at noon, and at bedtime. **low dose as maintenance**   predniSONE 20 MG tablet Commonly known as: DELTASONE Take 3 tablets once daily for 5 days followed by 2 tablets  once daily for 5 days followed by 1 tablet once daily for 5 days and then stop   rosuvastatin 20 MG tablet Commonly known as: CRESTOR TAKE 1 TABLET BY MOUTH DAILY   valsartan 160 MG tablet Commonly known as: DIOVAN Take 1 tablet (160 mg total) by mouth daily.   VITAMIN B-12 PO Take 1 tablet by mouth every Sunday.   ZINC PO Take 1 tablet by mouth daily.         OBJECTIVE:   Vital Signs: BP 124/82 (BP Location: Left Arm, Patient Position: Sitting, Cuff Size: Small)  Pulse (!) 110   Ht 5' 4.5" (1.638 m)   Wt 158 lb (71.7 kg)   SpO2 97%   BMI 26.70 kg/m   Wt Readings from Last 3 Encounters:  09/07/23 158 lb (71.7 kg)  07/05/23 155 lb 12.8 oz (70.7 kg)  06/01/23 153 lb 6.4 oz (69.6 kg)     Exam: General: Pt appears well and is in NAD  Neck: General: Supple without adenopathy. Thyroid: Thyroid size normal.  No goiter or nodules appreciated.  Lungs: Clear with good BS bilat with no rales, rhonchi, or wheezes  Heart: RRR with normal S1 and S2 and no gallops; no murmurs; no rub  Extremities: No pretibial edema.   Neuro: MS is good with appropriate affect, pt is alert and Ox3    DM foot exam:03/10/2023 per podiatry        DATA REVIEWED:  Lab Results  Component Value Date   HGBA1C 5.8 (A) 09/07/2023   HGBA1C 5.9 (A) 03/08/2023   HGBA1C 5.6 11/19/2022    Latest Reference Range & Units 08/06/23 10:46  Alkaline Phosphatase 39 - 117 U/L 86  Albumin 3.5 - 5.2 g/dL 3.9  AST 0 - 37 U/L 21  ALT 0 - 35 U/L 19  Total Protein 6.0 - 8.3 g/dL 7.2  Bilirubin, Direct 0.0 - 0.3 mg/dL 0.1  Total Bilirubin 0.2 - 1.2 mg/dL 0.4       Latest Reference Range & Units 01/21/23 09:50  Sodium 134 - 144 mmol/L 143  Potassium 3.5 - 5.2 mmol/L 4.2  Chloride 96 - 106 mmol/L 103  CO2 20 - 29 mmol/L 25  Glucose 70 - 99 mg/dL 99  BUN 8 - 27 mg/dL 19  Creatinine 0.98 - 1.19 mg/dL 1.47  Calcium 8.7 - 82.9 mg/dL 9.7  BUN/Creatinine Ratio 12 - 28  23  eGFR >59 mL/min/1.73 75   Alkaline Phosphatase 44 - 121 IU/L 109  Albumin 3.8 - 4.8 g/dL 4.4  AST 0 - 40 IU/L 17  ALT 0 - 32 IU/L 17  Total Protein 6.0 - 8.5 g/dL 7.6  Total Bilirubin 0.0 - 1.2 mg/dL 0.3      ASSESSMENT / PLAN / RECOMMENDATIONS:   1) Type 2 Diabetes Mellitus, Optimally controlled, With Retinopathic, neuropathic and macrovascular complications - Most recent A1c of 5.8 %. Goal A1c < 7.0 %.    - A1c remains at goal.  - She is not interested in restarting Metformin  - Glimepiride caused weight gain -She is comfortable with the current weight and does not wish to lose anymore weight -No changes at this time    MEDICATIONS: Continue Ozempic  0.5 mg weekly  EDUCATION / INSTRUCTIONS: BG monitoring instructions: Patient is instructed to check her blood sugars 1 times a day    2) Diabetic complications:  Eye: Does  have known diabetic retinopathy.  Neuro/ Feet: Does  have known diabetic peripheral neuropathy .  Renal: Patient does not have known baseline CKD. She   is  on an ACEI/ARB at present.    F/U in 6 months   Signed electronically by: Lyndle Herrlich, MD  [facility]  [facility] [address] [place] [place], [place] [address] Phone: [phone] FAX: [phone]   CC: Olive Bass, FNP [address] [place] [address] Phone: [phone]  Fax: [phone]  Return to Endocrinology clinic as below: Future Appointments  Date Time Provider Department Center  09/13/2023  2:45 PM Kalman Shan, MD [facility]  None  11/16/2023 10:30 AM Alver Sorrow, NP [facility]  11/30/2023  8:40 AM Olive Bass, FNP [facility]

## 2023-09-07 NOTE — Patient Instructions (Signed)
Continue Ozempic 0.5 mg once weekly       HOW TO TREAT LOW BLOOD SUGARS (Blood sugar LESS THAN 70 MG/DL) Please follow the RULE OF 15 for the treatment of hypoglycemia treatment (when your (blood sugars are less than 70 mg/dL)   STEP 1: Take 15 grams of carbohydrates when your blood sugar is low, which includes:  3-4 GLUCOSE TABS  OR 3-4 OZ OF JUICE OR REGULAR SODA OR ONE TUBE OF GLUCOSE GEL    STEP 2: RECHECK blood sugar in 15 MINUTES STEP 3: If your blood sugar is still low at the 15 minute recheck --> then, go back to STEP 1 and treat AGAIN with another 15 grams of carbohydrates.

## 2023-09-13 ENCOUNTER — Other Ambulatory Visit: Payer: Self-pay

## 2023-09-13 ENCOUNTER — Encounter: Payer: Self-pay | Admitting: Internal Medicine

## 2023-09-13 ENCOUNTER — Ambulatory Visit (INDEPENDENT_AMBULATORY_CARE_PROVIDER_SITE_OTHER): Payer: 59 | Admitting: Internal Medicine

## 2023-09-13 VITALS — BP 104/70 | HR 99 | Temp 98.4°F | Ht 64.0 in | Wt 155.0 lb

## 2023-09-13 DIAGNOSIS — J302 Other seasonal allergic rhinitis: Secondary | ICD-10-CM | POA: Diagnosis not present

## 2023-09-13 DIAGNOSIS — E782 Mixed hyperlipidemia: Secondary | ICD-10-CM

## 2023-09-13 DIAGNOSIS — J84112 Idiopathic pulmonary fibrosis: Secondary | ICD-10-CM | POA: Diagnosis not present

## 2023-09-13 DIAGNOSIS — R768 Other specified abnormal immunological findings in serum: Secondary | ICD-10-CM

## 2023-09-13 DIAGNOSIS — R053 Chronic cough: Secondary | ICD-10-CM

## 2023-09-13 DIAGNOSIS — I2510 Atherosclerotic heart disease of native coronary artery without angina pectoris: Secondary | ICD-10-CM

## 2023-09-13 DIAGNOSIS — I10 Essential (primary) hypertension: Secondary | ICD-10-CM

## 2023-09-13 DIAGNOSIS — F17210 Nicotine dependence, cigarettes, uncomplicated: Secondary | ICD-10-CM

## 2023-09-13 MED ORDER — VALSARTAN 160 MG PO TABS: 160.0000 mg | ORAL_TABLET | Freq: Every day | ORAL | 2 refills | Status: DC

## 2023-09-13 NOTE — Progress Notes (Signed)
 OV 04/20/2023 -of care to the ILD center.  Referred by Dr. Craige Cotta.  Subjective:  Patient ID: Amy Escobar, female , DOB: [birth year] , age 78 y.o. , MRN: [id number] , ADDRESS: [address] [place] [address] PCP Amy Bass, FNP Patient Care Team: Amy Bass, FNP as PCP - General (Internal Medicine) Meriam Sprague, MD (Inactive) as PCP - Cardiology (Cardiology) Lurena Nida, MD (Internal Medicine) Henreitta Leber, PA-C as Physician Assistant (Obstetrics and Gynecology) Lyn Records, MD (Inactive) as Consulting Physician (Cardiology) Louis Meckel, MD (Inactive) as Consulting Physician (Gastroenterology) Luciana Axe Alford Highland, MD as Consulting Physician (Ophthalmology) Nyoka Cowden, MD as Consulting Physician (Pulmonary Disease) Judi Saa, DO as Attending Physician (Family Medicine) Helane Gunther, DPM as Consulting Physician (Podiatry)  This Provider for this visit: Treatment Team:  Attending Provider: Kalman Shan, MD    04/20/2023 -   Chief Complaint  Patient presents with   Follow-up    Pt was put on symbicort and she does take it everyday. Pt is f/u Ild has concerns about the scarring      HPI Amy Escobar 78 y.o. -I am meeting for the first time.  Presents with daughter Amy Escobar.  Both independent historians.  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  As best as I can gather patient is to be followed by Dr. Sandrea Hughs.  4 years ago there was significant allergy testing positivity with seasonal allergy positive to grass and dust including dust mite and Alternaria and pet dander.  She also has a elevated IgE greater than 1000 sometimes even 2000.  She has been having cough for the last 5 to 6 years present both day and night.  Is associated with acid reflux decreased by Mucinex considered moderate to severe.  Also shortness of breath for the last 2 or 3 years years particular  when she climbs stairs but not for other simple activities of daily living.  She saw Dr. Craige Cotta who did a high-resolution CT chest.  See below.  I personally visualized his agree with probable UIP pattern.  Therefore she has been referred here.  She denies any mold exposure of bird feather or down comforter exposure. That she is noticed to have acid reflux but also on Fosamax.   She has had serologies trace positive for rheumatoid factor but otherwise negative.  - Medications reviewed.      CT Chest data from date:   ive & Impression  CLINICAL DATA:  Abnormal chest radiograph. Evaluate for interstitial lung disease.   EXAM: CT CHEST WITHOUT CONTRAST   TECHNIQUE: Multidetector CT imaging of the chest was performed following the standard protocol without intravenous contrast. High resolution imaging of the lungs, as well as inspiratory and expiratory imaging, was performed.   RADIATION DOSE REDUCTION: This exam was performed according to the departmental dose-optimization program which includes automated exposure control, adjustment of the mA and/or kV according to patient size and/or use of iterative reconstruction technique.   COMPARISON:  01/14/2023 chest radiograph.   FINDINGS: Cardiovascular: Normal heart size. No significant pericardial effusion/thickening. Left anterior descending coronary atherosclerosis. Atherosclerotic nonaneurysmal thoracic aorta. Normal caliber pulmonary arteries.   Mediastinum/Nodes: No significant thyroid nodules. Unremarkable esophagus. No pathologically enlarged axillary, mediastinal or hilar lymph nodes, noting limited sensitivity for the detection of hilar adenopathy on this noncontrast study.   Lungs/Pleura: No pneumothorax. No pleural effusion. No acute consolidative airspace disease, lung masses or significant  pulmonary nodules. No significant lobular air trapping or evidence of tracheobronchomalacia on the expiration sequence.  Moderate patchy confluent subpleural reticulation and ground-glass opacity throughout both lungs with associated moderate traction bronchiectasis and architectural distortion. There is a mild basilar predominance to these findings. No frank honeycombing. Comparison to the 11/10/2021 chest CT study is limited by prominent motion degradation on the prior scan, favor mild interval progression.   Upper abdomen: No acute abnormality.   Musculoskeletal: No aggressive appearing focal osseous lesions. Mild thoracic spondylosis.   IMPRESSION: 1. Spectrum of findings c 07/05/2023 Discussed the use of AI scribe software for clinical note transcription with the patient, who gave verbal consent to proceed.  Patient was given a provisional diagnosis of IPF based on age, disease prodominantly in lower lobes (probably UIP) and normal serology. She was started on pirfenidone in October. Liver function was slightly elevated in December, medication was stopped per Dr. Marchelle Gearing and restarted at lower dose. The patient reported experiencing dizziness and feeling "stoned" while on the medication, to the point of being unable to drive. Patient notified our office on 06/14/2023 with reports of bronchitis symptoms with increased shortness of breath. She was admitted for IPF flare vs PNA. She was discharged on Augmentin which she has completed and has 1 day left of prednisone as well at home oxygen at 2L. Respiratory-wise, the patient denies any current breathing difficulties or shortness of breath. They reported a very minimal cough with occasional clear mucus production, and no wheezing or chest tightness. They also mentioned having acid reflux, which is exacerbated by certain foods.  The patient also has a bladder problem, for which they take Toviaz. They reported that they have been waking up to urinate and have been experiencing urinary urgency. They also mentioned that they have been having issues with emptying their  bladder completely. The patient was on a daily antibiotic, Keflex, for urinary tract infection prevention, but it was stopped during their hospital stay due to a potential interaction with the Pirfenidone.    uidelines: Diagnosis of Idiopathic Pulmonary Fibrosis: An Official ATS/ERS/JRS/ALAT Clinical Practice Guideline. Am Rosezetta Schlatter Crit Care Med Vol 198, Iss 5, [phone], Feb 27 2017. 2. One vessel coronary atherosclerosis. 3.  Aortic Atherosclerosis (ICD10-I70.0).     Electronically Signed   By: Delbert Phenix M.D.   On: 02/04/2023 14:22    Latest Reference Range & Units 01/28/07 09:21 03/26/08 11:40 06/19/09 07:31 12/20/09 10:02 03/09/10 09:13 07/07/10 07:51 07/31/11 08:24 08/05/12 09:12 08/04/13 14:09 04/03/14 11:14 08/06/14 09:03 11/16/14 11:26 08/09/15 09:09 08/12/16 08:18 07/04/18 15:54 08/22/18 09:40 11/30/18 12:26 12/15/19 16:07 02/13/20 10:08 04/05/20 10:20 07/26/20 16:11 02/07/21 11:20 09/16/21 13:27 11/18/21 08:37 03/03/22 08:49 08/07/22 13:30  Eosinophils Absolute 0.0 - 0.7 K/uL 0.4 0.2 0.3 0.2 0.3 0.2 0.2 0.7 0.5 0.2 0.4 0.3 0.2 0.3 0.2 0.2 0.1 0.2 214 0.3 0.2 0.2 0.1 0.3 0.1 0.2     Latest Reference Range & Units 01/21/23 09:50  Immature Granulocytes Not Estab. % 0  Immature Grans (Abs) 0.0 - 0.1 x10E3/uL 0.0  IgE (Immunoglobulin E), Serum 6 - 495 IU/mL 1,646 (H)  ANA Titer 1  Negative  dsDNA Ab 0 - 9 IU/mL <1  ENA RNP Ab 0.0 - 0.9 AI 0.3  ENA SSA (RO) Ab 0.0 - 0.9 AI <0.2  ENA SSB (LA) Ab 0.0 - 0.9 AI <0.2  RA Latex Turbid. <14.0 IU/mL 15.5 (H)  ENA SM Ab Ser-aCnc 0.0 - 0.9 AI <0.2  Scleroderma (Scl-70) (ENA) Antibody, IgG 0.0 -  0.9 AI <0.2  (H): Data is abnormally high PFT   OV 09/13/2023  Subjective:  Patient ID: Amy Escobar, female , DOB: Nov 13, 1945 , age 24 y.o. , MRN: [id number] , ADDRESS: [address] [place] [address] PCP Amy Bass, FNP Patient Care Team: Amy Bass, FNP as PCP - General (Internal  Medicine) Meriam Sprague, MD (Inactive) as PCP - Cardiology (Cardiology) Lurena Nida, MD (Internal Medicine) Henreitta Leber, PA-C as Physician Assistant (Obstetrics and Gynecology) Lyn Records, MD (Inactive) as Consulting Physician (Cardiology) Louis Meckel, MD (Inactive) as Consulting Physician (Gastroenterology) Luciana Axe Alford Highland, MD as Consulting Physician (Ophthalmology) Nyoka Cowden, MD as Consulting Physician (Pulmonary Disease) Judi Saa, DO as Attending Physician (Family Medicine) Helane Gunther, DPM as Consulting Physician (Podiatry)  This Provider for this visit: Treatment Team:  Attending Provider: Kalman Shan, MD    09/13/2023 -   Chief Complaint  Patient presents with   Follow-up    Breathing has overall been doing well. She is using her o2 mainly just at night. She has a prod cough with white sputum.    #IPF diagnosed given October 2024 and started pirfenidone  -As of March 2025 on low-dose pirfenidone protocol  -Trace positive rheumatoid factor in July 2024  # Significant elevation in IgE  -2300in  2021 and 1600 in July 2024  -Positive for dog dander, [place] grass cockroach ragweed dust mite and different trees\  -Seen by Dr. Delorse Lek allergist and fall 2024.  #Hospitalization for pneumonia respiratory failure in December 2024  #Esbriet/Pirfenidone requires intensive drug monitoring due to high concerns for Adverse effects of , including  Drug Induced Liver Injury, significant GI side effects that include but not limited to Diarrhea, Nausea, Vomiting,  and other system side effects that include Fatigue, headaches, weight loss and other side effects such as skin rash. These will be monitored with  blood work such as LFT initially once a month for 6 months and then quarterly   - Intolerance to higher dose pirfenidone  -Low-dose pirfenidone since late 2024/early 2025.  HPI Aldona Bryner 78 y.o. -returns for follow-up with her  daughter.  At this visit she says she is on low-dose pirfenidone.  This because the high dose gave a lot of side effects.  She is tolerating low-dose fine without any problems.  She feels her shortness of breath is also stable.  Her main issues   - cough.  It is severe.  Daughter feels it is allergy related.  She did see the allergist she did not remember much details.  I went and reviewed the note.  Informed her that the allergist is considering allergy shots.  And advised that she needs to follow-up.  Initially considered Xolair but then based on allergy notes have recommended that she revisit with Dr. Delorse Lek  -Wanted no follow-up of CT scan: The CT scan in the hospital in December 2024 ruled out PE but showed pulmonary infiltrates.  She needs a repeat follow-up CT scan of the chest which we will get in couple of months I have personally visualized the CT scan from December 2024.  Also reviewed external records of the hospital.  -Other treatments: I told her pirfenidone and nintedanib are the only treatment modulating drugs.  She can try other over-the-counter or natural herbs but we do not know if this worked.  Recommended she consider clinical trial participation.  -Disease monitoring: Last pulmonary function test summer 2024.  She needs another PFT and CT scan.  Exercise hypoxemia test shows a tendency to desaturate but her oxygenation is adequate.    SYMPTOM SCALE - ILD 04/20/2023 09/13/2023   Current weight    O2 use ra ra  Shortness of Breath 0 -> 5 scale with 5 being worst (score 6 If unable to do)   At rest 0 0  Simple tasks - showers, clothes change, eating, shaving 0 2  Household (dishes, doing bed, laundry) 2 3  Shopping 3 4  Walking level at own pace 3 3  Walking up Stairs 5 0  Total (30-36) Dyspnea Score 18 11  How bad is your cough? 2 5  How bad is your fatigue 3 4  How bad is nausea 0 0  How bad is vomiting?  0 0  How bad is diarrhea? 0 0  How bad is anxiety? 0 2  How  bad is depression 0 2  Any chronic pain - if so where and how bad 0 0   Simple office walk 224 (66+46 x 2) feet Pod A at [facility] x  3 laps goal with forehead probe 09/13/2023    O2 used ra   Number laps completed 3 [place]   Comments about pace Mod pac   Resting Pulse Ox/HR 100% and 90/min   Final Pulse Ox/HR 93% and 102/min   Desaturated </= 88% no   Desaturated <= 3% points yes   Got Tachycardic >/= 90/min yes   Symptoms at end of test No dyspnea   Miscellaneous comments x      PFT     Latest Ref Rng & Units 01/25/2023    8:33 AM 07/08/2020   10:05 AM 09/12/2013   11:57 AM  PFT Results  FVC-Pre L 1.96  1.75  2.53   FVC-Predicted Pre % 71  78  103   FVC-Post L 2.05  1.54  2.42   FVC-Predicted Post % 74  68  99   Pre FEV1/FVC % % 81  75  79   Post FEV1/FCV % % 85  97  83   FEV1-Pre L 1.59  1.31  2.00   FEV1-Predicted Pre % 77  75  106   FEV1-Post L 1.74  1.49  2.01   DLCO uncorrected ml/min/mmHg 10.18  21.45  17.08   DLCO UNC% % 53  111  70   DLCO corrected ml/min/mmHg 10.18  21.45    DLCO COR %Predicted % 53  111    DLVA Predicted % 76  85  94   TLC L 4.32  2.94  4.12   TLC % Predicted % 85  58  81   RV % Predicted % 116  55  76        LAB RESULTS last 96 hours No results found.       has a past medical history of Alkaline phosphatase deficiency, Allergic rhinitis, Allergy, Anxiety, Arthritis, Cataract, DM2 (diabetes mellitus, type 2) (HCC), GERD (gastroesophageal reflux disease), Gout, Hemorrhoids, Hyperlipidemia, Hypertension, Interstitial lung disease (HCC), Mild pulmonary hypertension (HCC), NSVT (nonsustained ventricular tachycardia) (HCC), Osteoporosis, PVC's (premature ventricular contractions), Thyroid nodule, Tubular adenoma of colon (2017), Urticaria, and Uterine fibroid.   reports that she quit smoking about 55 years ago. Her smoking use included cigarettes. She started smoking about 60 years ago. She has a 1.3 pack-year smoking history. She has never  used smokeless tobacco.  Past Surgical History:  Procedure Laterality Date   CATARACT EXTRACTION Bilateral 2016  COLONOSCOPY  2007   echocardiogram (other)  01/16/2002   POLYPECTOMY     removed tumors from foot nerves  04/1999   stress cardiolite  02/12/2006   TOENAIL EXCISION     TUBAL LIGATION     TYMPANOSTOMY TUBE PLACEMENT      Allergies  Allergen Reactions   Benicar [Olmesartan] Other (See Comments)    Headache     Immunization History  Administered Date(s) Administered   Fluad Quad(high Dose 65+) 04/18/2019   Influenza Split 03/23/2011, 04/07/2013   Influenza Whole 04/13/2008, 04/29/2009, 03/29/2010, 03/29/2018   Influenza, High Dose Seasonal PF 03/11/2023   Influenza-Unspecified 04/08/2012, 03/28/2014, 03/14/2015, 03/13/2016, 04/15/2020   PFIZER(Purple Top)SARS-COV-2 Vaccination 08/13/2019, 09/05/2019, 04/15/2020   PPD Test 03/23/2011   Pfizer Covid-19 Vaccine Bivalent Booster 18yrs & up 04/29/2021   Pneumococcal Conjugate-13 01/10/2014   Pneumococcal Polysaccharide-23 07/07/2010, 03/31/2018   Td 12/27/2001   Tdap 08/06/2014   Zoster Recombinant(Shingrix) 03/31/2018    Family History  Problem Relation Age of Onset   Heart disease Mother    Allergic rhinitis Mother    Heart disease Father    Stomach cancer Maternal Grandmother    Heart disease Maternal Grandfather    Rectal cancer Maternal Grandfather    Colon cancer Neg Hx    Breast cancer Neg Hx    Colon polyps Neg Hx    Esophageal cancer Neg Hx      Current Outpatient Medications:    acetaminophen (TYLENOL) 500 MG tablet, Take 500 mg by mouth daily as needed for headache., Disp: , Rfl:    aspirin EC 81 MG tablet, Take 81 mg by mouth daily., Disp: , Rfl:    benzonatate (TESSALON) 200 MG capsule, Take 1 capsule (200 mg total) by mouth 3 (three) times daily as needed for cough., Disp: 30 capsule, Rfl: 1   Blood Glucose Monitoring Suppl (ONETOUCH VERIO FLEX SYSTEM) w/Device KIT, USE TO TEST BLOOD SUGAR  DAILY AS DIRECTED, Disp: 1 kit, Rfl: 0   Calcium Carb-Cholecalciferol (CALCIUM + VITAMIN D3 PO), Take 1 tablet by mouth daily., Disp: , Rfl:    cephALEXin (KEFLEX) 250 MG capsule, Take 250 mg by mouth daily., Disp: , Rfl:    Cyanocobalamin (VITAMIN B-12 PO), Take 1 tablet by mouth every Sunday., Disp: , Rfl:    esomeprazole (NEXIUM) 40 MG capsule, TAKE 1 CAPSULE BY MOUTH 2 TIMES A DAY BEFORE A MEAL, Disp: 180 capsule, Rfl: 0   fesoterodine (TOVIAZ) 4 MG TB24 tablet, Take 4 mg by mouth daily., Disp: , Rfl:    fluticasone-salmeterol (ADVAIR HFA) 115-21 MCG/ACT inhaler, Inhale 2 puffs into the lungs 2 (two) times daily., Disp: , Rfl:    gabapentin (NEURONTIN) 100 MG capsule, Take 1 capsule (100 mg total) by mouth at bedtime. (Patient taking differently: Take 100 mg by mouth 2 (two) times daily.), Disp: 90 capsule, Rfl: 3   gabapentin (NEURONTIN) 300 MG capsule, Take 300 mg by mouth daily as needed (neuropathy)., Disp: , Rfl:    glucose blood (ONETOUCH VERIO) test strip, USE TO CHECK BLOOD SUGAR TWO TIMES A DAY, Disp: 100 strip, Rfl: 3   lidocaine (LIDODERM) 5 %, 1 patch daily., Disp: , Rfl:    Multiple Vitamins-Minerals (ZINC PO), Take 1 tablet by mouth daily., Disp: , Rfl:    Pirfenidone 267 MG TABS, Take 2 tablets (534 mg total) by mouth in the morning, at noon, and at bedtime. **low dose as maintenance**, Disp: 540 tablet, Rfl: 0   rosuvastatin (CRESTOR) 20 MG tablet, TAKE  1 TABLET BY MOUTH DAILY, Disp: 90 tablet, Rfl: 2   Semaglutide,0.25 or 0.5MG /DOS, (OZEMPIC, 0.25 OR 0.5 MG/DOSE,) 2 MG/3ML SOPN, 0.5 mg by Other route once a week., Disp: 9 mL, Rfl: 3   valsartan (DIOVAN) 160 MG tablet, Take 1 tablet (160 mg total) by mouth daily., Disp: 90 tablet, Rfl: 2      Objective:   Vitals:   09/13/23 1458  BP: 104/70  Pulse: 99  Temp: 98.4 F (36.9 C)  TempSrc: Oral  SpO2: 94%  Weight: 155 lb (70.3 kg)  Height: 5\' 4"  (1.626 m)    Estimated body mass index is 26.61 kg/m as calculated from  the following:   Height as of this encounter: 5\' 4"  (1.626 m).   Weight as of this encounter: 155 lb (70.3 kg).  @WEIGHTCHANGE @  Filed Weights   09/13/23 1458  Weight: 155 lb (70.3 kg)     Physical Exam   General: No distress. Looks  well O2 at rest: no Cane present: no Sitting in wheel chair: no Frail: no Obese: no Neuro: Alert and Oriented x 3. GCS 15. Speech normal Psych: Pleasant Resp:  Barrel Chest - no.  Wheeze - no, Crackles -YES BASE, No overt respiratory distress CVS: Normal heart sounds. Murmurs - no Ext: Stigmata of Connective Tissue Disease - no HEENT: Normal upper airway. PEERL +. No post nasal drip        Assessment:       ICD-10-CM   1. IPF (idiopathic pulmonary fibrosis) (HCC)  J84.112 CT Chest High Resolution    Pulmonary function test    Hepatic function panel    Hepatic function panel    2. Chronic cough  R05.3 CT Chest High Resolution    Pulmonary function test    Hepatic function panel    Hepatic function panel    3. Seasonal allergies  J30.2 CT Chest High Resolution    Pulmonary function test    Hepatic function panel    Hepatic function panel    4. Elevated IgE level  R76.8 CT Chest High Resolution    Pulmonary function test    Hepatic function panel    Hepatic function panel         Plan:     Patient Instructions  ILD (interstitial lung disease) (HCC) IPF (idiopathic pulmonary fibrosis) (HCC)  -Giving a provisional diagnosis of idiopathic pulmonary fibrosis [IPF].  This is based on age greater than 34, disease predominantly in the lower lobe, probable UIP description on the CT scan, progression since 2020 [he had normal pulmonary function test in 2015], and near normal serology blood work  -You had intolerance to high-dose Esbriet.  You are doing well with low-dose Esbriet protocol -Course characterized by pneumonia admission in December 2024 -I am glad that you have returned to baseline  Plan  -Continue low-dose   pirfenidone per protocol  - Check LFT 09/13/2023  -Discussed this medicine in detail -Clinical trial considerations in the future'   - Consider IPF-pro registry study = Do high-resolution CT chest supine and prone in the next 2 months - Do spirometry and DLCO in the next 2 months   GERD  Plan  - Address at next visit including Fosamax use  Seasonal allergies Elevated IgE level House dust mite allergy  -You did see Dr. Delorse Lek in November 2024.  She recommended that if the cough is not controlled that she would need to do skin allergy testing and then consider allergy shots  Plan   -  Please fix an appointment Dr. Delorse Lek and revisit the topic  Chronic cough  -This is severe and likely related to allergies and pulmonary fibrosis  Plan - Recommend approaching the allergy issue first because cough from fibrosis can be very difficult to treat - So, I am going to hold off on xolair - Please see Dr Delorse Lek   Follow-up - 8-9 weeks-week visit with Dr. Marchelle Gearing o 30-minute to discuss CT scan and PFT  = Symptom score and exercise hypoxemia test at follow-up   FOLLOWUP Return in about 8 weeks (around 11/08/2023) for 30 min visit, after Cleda Daub and DLCO, after HRCT chest, Face to Face Visit, with Dr Marchelle Gearing.    SIGNATURE    Dr. Kalman Shan, M.D., F.C.C.P,  Pulmonary and Critical Care Medicine Staff Physician, [facility] Director - Interstitial Lung Disease  Program  Pulmonary Fibrosis [facility] at [facility] [place], [place], [address]  Pager: [phone], If no answer or between  15:00h - 7:00h: call [phone] Telephone: [phone]  6:30 PM 09/13/2023

## 2023-09-13 NOTE — Patient Instructions (Addendum)
 ILD (interstitial lung disease) (HCC) IPF (idiopathic pulmonary fibrosis) (HCC)  -Giving a provisional diagnosis of idiopathic pulmonary fibrosis [IPF].  This is based on age greater than 48, disease predominantly in the lower lobe, probable UIP description on the CT scan, progression since 2020 [he had normal pulmonary function test in 2015], and near normal serology blood work  -You had intolerance to high-dose Esbriet.  You are doing well with low-dose Esbriet protocol -Course characterized by pneumonia admission in December 2024 -I am glad that you have returned to baseline  Plan  -Continue low-dose  pirfenidone per protocol  - Check LFT 09/13/2023  -Discussed this medicine in detail -Clinical trial considerations in the future'   - Consider IPF-pro registry study = Do high-resolution CT chest supine and prone in the next 2 months - Do spirometry and DLCO in the next 2 months   GERD  Plan  - Address at next visit including Fosamax use  Seasonal allergies Elevated IgE level House dust mite allergy  -You did see Dr. Delorse Lek in November 2024.  She recommended that if the cough is not controlled that she would need to do skin allergy testing and then consider allergy shots  Plan   - Please fix an appointment Dr. Delorse Lek and revisit the topic  Chronic cough  -This is severe and likely related to allergies and pulmonary fibrosis  Plan - Recommend approaching the allergy issue first because cough from fibrosis can be very difficult to treat - So, I am going to hold off on xolair - Please see Dr Delorse Lek   Follow-up - 8-9 weeks-week visit with Dr. Marchelle Gearing o 30-minute to discuss CT scan and PFT  = Symptom score and exercise hypoxemia test at follow-up

## 2023-09-14 LAB — HEPATIC FUNCTION PANEL
ALT: 13 U/L (ref 0–35)
AST: 21 U/L (ref 0–37)
Albumin: 4.1 g/dL (ref 3.5–5.2)
Alkaline Phosphatase: 82 U/L (ref 39–117)
Bilirubin, Direct: 0.1 mg/dL (ref 0.0–0.3)
Total Bilirubin: 0.2 mg/dL (ref 0.2–1.2)
Total Protein: 7.7 g/dL (ref 6.0–8.3)

## 2023-09-14 LAB — HM DIABETES EYE EXAM

## 2023-09-17 ENCOUNTER — Other Ambulatory Visit: Payer: Self-pay

## 2023-09-17 NOTE — Progress Notes (Signed)
 Specialty Pharmacy Refill Coordination Note  Amy Escobar is a 78 y.o. female contacted today regarding refills of specialty medication(s) Pirfenidone   Patient requested Delivery   Delivery date: 09/24/23   Verified address: [address] [place]   Medication will be filled on 09/23/23.

## 2023-09-20 ENCOUNTER — Telehealth: Payer: Self-pay

## 2023-09-20 NOTE — Telephone Encounter (Signed)
 I can't see where Carvedilol was discontinued in hospital. She has still been taking, want me to get BP's and then refill if well controlled? Pt currently has 91mo fu in may

## 2023-09-20 NOTE — Telephone Encounter (Signed)
 Called pt to ask her about her medication carvedilol 12.5 mg, which was D/C in December 2024 in the hospital. Pt stated that she was still taking this medication and would like to know why it was D/C and would like Garnetta Buddy, NP to let her know what is she suppose to be taking and not taking. Pt would like a call back concerning this matter. Please address

## 2023-09-20 NOTE — Telephone Encounter (Signed)
 Carvedilol likely discontinued it is not a cardioselective beta-blocker and can affect her lung function.  Would recommend discontinue.  Check BP/heart rate at home daily for 1 week and report back readings.  If blood pressure is not at goal based on those readings can adjust to cardioselective beta-blocker.  Alver Sorrow, NP

## 2023-09-20 NOTE — Telephone Encounter (Signed)
 Called patient,    Reviewed recommendations with patient, patient is agreeable and will call in one week with BP & HR readings for medication adjustment.    "Carvedilol likely discontinued it is not a cardioselective beta-blocker and can affect her lung function.  Would recommend discontinue.  Check BP/heart rate at home daily for 1 week and report back readings.  If blood pressure is not at goal based on those readings can adjust to cardioselective beta-blocker.   Alver Sorrow, NP"

## 2023-09-22 NOTE — Progress Notes (Signed)
 [address] [place] [address] Dept: [id number]  FOLLOW UP NOTE  Patient ID: Amy Escobar, female    DOB: [birth year]  Age: 78 y.o. MRN: [id number] Date of Office Visit: 09/23/2023  Assessment  Chief Complaint: Allergic Rhinitis  (Says she is okay)  HPI Amy Escobar is a 78 year old female who presents to the clinic for a follow up visit. She was last seen in this clinic on 05/05/2023 by Dr. Delorse Lek for evaluation of allergic rhintiis, allergic conjunctivitis, and reflux.  In the interim, she presented to the emergency department on 06/15/2023 for evaluation of dyspnea, cough, and mucus production.  Oxygen level in the emergency department was 80s on room air and she required supplemental oxygen via nasal cannula.  Normal CBC, BMP, and troponin with the exception of sodium 132.  EKG sinus rhythm with prolonged QTc.  CT angio chest was negative for acute pulmonary embolism, however, findings were consistent with multifocal pneumonia as well as interstitial lung disease.  New mediastinal and hilar lymphadenopathy were visualized and noted to be likely reactive.  IV antibiotics started.  Patient diagnosed with acute respiratory failure with hypoxia and interstitial lung disease with hospital discharge on 06/19/2023.  At that time, she continued antibiotics, home oxygen, and the steroid taper.  At today's visit, she reports her allergic rhinitis has been poorly controlled with symptoms including clear rhinorrhea, nasal congestion, occasional sneezing, and postnasal drainage with frequent cough or throat clearing.  She has previously tried Ryaltris with no relief of symptoms.  She is not currently using a nasal saline rinse and she reports that she was unable to get the Xyzal from the pharmacy at her last visit.    Allergic conjunctivitis is reported as moderately well-controlled with occasional red and itchy eyes for which she uses olopatadine with relief of symptoms.    Reflux is  reported as well-controlled with no symptoms including heartburn or vomiting.  She continues esomeprazole 40 mg twice a day.  She continues to take these medications between 30 and 60 minutes before meals.  She continues to follow-up with her GI specialist at [facility] as recommended.  She continues to follow up Dr. Marchelle Gearing, pulmonology specialist, for treatment idiopathic pulmonary fibrosis.  She reports that she is currently taking pirfenidone.   She continues to follow-up with [place] [facility] at [facility].  She has recently discontinued carvedilol and is currently tracking BP and heart rate and will follow-up with her cardiology specialist with these readings for further management of blood pressure and heart rate.  Her current medications are listed in the chart.  Drug Allergies:  Allergies  Allergen Reactions   Benicar [Olmesartan] Other (See Comments)    Headache     Physical Exam: BP 110/82   Pulse 80   Temp 98.6 F (37 C) (Temporal)   Ht 5\' 3"  (1.6 m)   Wt 156 lb (70.8 kg)   SpO2 98%   BMI 27.63 kg/m    Physical Exam Vitals reviewed.  Constitutional:      Appearance: Normal appearance.  HENT:     Head: Normocephalic and atraumatic.     Right Ear: Tympanic membrane normal.     Left Ear: Tympanic membrane normal.     Nose:     Comments: Bilateral nares slightly erythematous with thin clear nasal drainage noted.  Pharynx slightly erythematous with thin yellow nasal drainage noted.  No exudate noted.  Ears normal.  Eyes normal. Eyes:  Conjunctiva/sclera: Conjunctivae normal.  Cardiovascular:     Rate and Rhythm: Normal rate and regular rhythm.     Heart sounds: Normal heart sounds. No murmur heard. Pulmonary:     Effort: Pulmonary effort is normal.     Breath sounds: Normal breath sounds.     Comments: Lungs clear to auscultation Musculoskeletal:        General: Normal range of motion.     Cervical back: Normal range of  motion and neck supple.  Skin:    General: Skin is warm and dry.  Neurological:     Mental Status: She is alert and oriented to person, place, and time.  Psychiatric:        Mood and Affect: Mood normal.        Behavior: Behavior normal.        Thought Content: Thought content normal.        Judgment: Judgment normal.    Assessment and Plan: 1. Seasonal and perennial allergic rhinitis   2. Allergic conjunctivitis of both eyes   3. Interstitial lung disease (HCC)     Meds ordered this encounter  Medications   azelastine (ASTELIN) 0.1 % nasal spray    Sig: Place 2 sprays into both nostrils 2 (two) times daily.    Dispense:  30 mL    Refill:  5   fluticasone (FLONASE) 50 MCG/ACT nasal spray    Sig: Place 2 sprays into both nostrils daily as needed for allergies or rhinitis.    Dispense:  16 g    Refill:  5   cetirizine (ZYRTEC) 10 MG tablet    Sig: Take 0.5 tablets (5 mg total) by mouth daily.    Dispense:  30 tablet    Refill:  5    Patient Instructions  Allergic rhinitis Continue allergen avoidance measures directed toward grass pollen, weed pollen, ragweed pollen, tree pollen, mold, dog, cockroach and tobacco as listed below Begin azelastine 2 sprays in each nostril twice a day if needed for a runny nose Begin Flonase 2 sprays in each nostril once a day if needed for a stuffy nose Consider saline nasal rinses as needed for nasal symptoms. Use this before any medicated nasal sprays for best result Begin cetirizine 5 mg once a day if needed for a runny nose. You may take an additional dose of cetirizine if needed for breakthrough symptoms  Allergic conjunctivitis Some over the counter eye drops include Pataday one drop in each eye once a day as needed for red, itchy eyes OR Zaditor one drop in each eye twice a day as needed for red itchy eyes. Avoid eye drops that say red eye relief as they may contain medications that dry out your eyes.   Reflux Continue dietary  lifestyle modifications as listed below Continue to follow up with your GI doctor for evaluation and treatment of reflux  Call the clinic if this treatment plan is not working well for you.  Follow up in 3 months or sooner if needed.   Return in about 3 months (around 12/24/2023), or if symptoms worsen or fail to improve.    Thank you for the opportunity to care for this patient.  Please do not hesitate to contact me with questions.  Thermon Leyland, FNP [facility]

## 2023-09-22 NOTE — Patient Instructions (Signed)
 Allergic rhinitis Continue allergen avoidance measures directed toward grass pollen, weed pollen, ragweed pollen, tree pollen, mold, dog, cockroach and tobacco as listed below Begin azelastine 2 sprays in each nostril twice a day if needed for a runny nose Begin Flonase 2 sprays in each nostril once a day if needed for a stuffy nose Consider saline nasal rinses as needed for nasal symptoms. Use this before any medicated nasal sprays for best result Begin cetirizine 5 mg once a day if needed for a runny nose. You may take an additional dose of cetirizine if needed for breakthrough symptoms  Allergic conjunctivitis Some over the counter eye drops include Pataday one drop in each eye once a day as needed for red, itchy eyes OR Zaditor one drop in each eye twice a day as needed for red itchy eyes. Avoid eye drops that say red eye relief as they may contain medications that dry out your eyes.   Reflux Continue dietary lifestyle modifications as listed below Continue to follow up with your GI doctor for evaluation and treatment of reflux  Call the clinic if this treatment plan is not working well for you.  Follow up in 3 months or sooner if needed.  Reducing Pollen Exposure The American Academy of Allergy, Asthma and Immunology suggests the following steps to reduce your exposure to pollen during allergy seasons. Do not hang sheets or clothing out to dry; pollen may collect on these items. Do not mow lawns or spend time around freshly cut grass; mowing stirs up pollen. Keep windows closed at night.  Keep car windows closed while driving. Minimize morning activities outdoors, a time when pollen counts are usually at their highest. Stay indoors as much as possible when pollen counts or humidity is high and on windy days when pollen tends to remain in the air longer. Use air conditioning when possible.  Many air conditioners have filters that trap the pollen spores. Use a HEPA room air filter to  remove pollen form the indoor air you breathe.  Control of Mold Allergen Mold and fungi can grow on a variety of surfaces provided certain temperature and moisture conditions exist.  Outdoor molds grow on plants, decaying vegetation and soil.  The major outdoor mold, Alternaria and Cladosporium, are found in very high numbers during hot and dry conditions.  Generally, a late Summer - Fall peak is seen for common outdoor fungal spores.  Rain will temporarily lower outdoor mold spore count, but counts rise rapidly when the rainy period ends.  The most important indoor molds are Aspergillus and Penicillium.  Dark, humid and poorly ventilated basements are ideal sites for mold growth.  The next most common sites of mold growth are the bathroom and the kitchen.  Outdoor [employer] Use air conditioning and keep windows closed Avoid exposure to decaying vegetation. Avoid leaf raking. Avoid grain handling. Consider wearing a face mask if working in moldy areas.  Indoor Mold Control Maintain humidity below 50%. Clean washable surfaces with 5% bleach solution. Remove sources e.g. Contaminated carpets.  Control of Dog or Cat Allergen Avoidance is the best way to manage a dog or cat allergy. If you have a dog or cat and are allergic to dog or cats, consider removing the dog or cat from the home. If you have a dog or cat but don't want to find it a new home, or if your family wants a pet even though someone in the household is allergic, here are some strategies that may  help keep symptoms at bay:  Keep the pet out of your bedroom and restrict it to only a few rooms. Be advised that keeping the dog or cat in only one room will not limit the allergens to that room. Don't pet, hug or kiss the dog or cat; if you do, wash your hands with soap and water. High-efficiency particulate air (HEPA) cleaners run continuously in a bedroom or living room can reduce allergen levels over time. Regular use of a  high-efficiency vacuum cleaner or a central vacuum can reduce allergen levels. Giving your dog or cat a bath at least once a week can reduce airborne allergen.  Control of Cockroach Allergen Cockroach allergen has been identified as an important cause of acute attacks of asthma, especially in urban settings.  There are fifty-five species of cockroach that exist in the [place], however only three, the [place], [place] species produce allergen that can affect patients with Asthma.  Allergens can be obtained from fecal particles, egg casings and secretions from cockroaches.    Remove food sources. Reduce access to water. Seal access and entry points. Spray runways with 0.5-1% Diazinon or Chlorpyrifos Blow boric acid power under stoves and refrigerator. Place bait stations (hydramethylnon) at feeding sites.

## 2023-09-23 ENCOUNTER — Ambulatory Visit (INDEPENDENT_AMBULATORY_CARE_PROVIDER_SITE_OTHER): Admitting: Family Medicine

## 2023-09-23 ENCOUNTER — Encounter: Payer: Self-pay | Admitting: Family Medicine

## 2023-09-23 VITALS — BP 110/82 | HR 80 | Temp 98.6°F | Ht 63.0 in | Wt 156.0 lb

## 2023-09-23 DIAGNOSIS — J849 Interstitial pulmonary disease, unspecified: Secondary | ICD-10-CM

## 2023-09-23 DIAGNOSIS — H1013 Acute atopic conjunctivitis, bilateral: Secondary | ICD-10-CM | POA: Diagnosis not present

## 2023-09-23 DIAGNOSIS — J3089 Other allergic rhinitis: Secondary | ICD-10-CM | POA: Diagnosis not present

## 2023-09-23 DIAGNOSIS — J302 Other seasonal allergic rhinitis: Secondary | ICD-10-CM | POA: Diagnosis not present

## 2023-09-23 MED ORDER — CETIRIZINE HCL 10 MG PO TABS: 5.0000 mg | ORAL_TABLET | Freq: Every day | ORAL | 5 refills | Status: AC

## 2023-09-23 MED ORDER — FLUTICASONE PROPIONATE 50 MCG/ACT NA SUSP: 2.0000 | Freq: Every day | NASAL | 5 refills | Status: DC | PRN

## 2023-09-23 MED ORDER — AZELASTINE HCL 0.1 % NA SOLN: 2.0000 | Freq: Two times a day (BID) | NASAL | 5 refills | Status: AC

## 2023-09-25 ENCOUNTER — Other Ambulatory Visit: Payer: Self-pay | Admitting: Physician Assistant

## 2023-09-28 ENCOUNTER — Telehealth (HOSPITAL_BASED_OUTPATIENT_CLINIC_OR_DEPARTMENT_OTHER): Payer: Self-pay | Admitting: Family

## 2023-09-28 NOTE — Telephone Encounter (Signed)
 New Message:     Patient is calling to give her blood pressure readings.   09-20-23-    123/82  pulse 92 09-21-23-    124/85  pulse 75 09-22-23-    124/86  pulse 89 09-23-23-    111/86  pulse 89 09-24-23-    120/78  pulse 92 09-25-23-    129/72  pulse 86 09-26-23-    123/79  pulse 86 09-27-23-    118/71  pulse 86 09-28-23-       115/73  pulse 86

## 2023-09-29 NOTE — Telephone Encounter (Signed)
 BP looks good. Recommend remain off Carvedilol as advised during December hospitalization.  Alver Sorrow, NP

## 2023-09-29 NOTE — Telephone Encounter (Signed)
 Called and spoke to pt. Discussed APP's recommendation. She verbalized understanding.

## 2023-10-21 ENCOUNTER — Other Ambulatory Visit: Payer: Self-pay

## 2023-10-24 ENCOUNTER — Other Ambulatory Visit: Payer: Self-pay | Admitting: Family

## 2023-10-24 DIAGNOSIS — R058 Other specified cough: Secondary | ICD-10-CM

## 2023-10-25 ENCOUNTER — Ambulatory Visit
Admission: RE | Admit: 2023-10-25 | Discharge: 2023-10-25 | Disposition: A | Discharge status: Home / Self Care | Source: Ambulatory Visit | Attending: Internal Medicine | Admitting: Internal Medicine

## 2023-10-25 ENCOUNTER — Other Ambulatory Visit: Payer: Self-pay | Admitting: Family

## 2023-10-25 DIAGNOSIS — R053 Chronic cough: Secondary | ICD-10-CM

## 2023-10-25 DIAGNOSIS — Z1231 Encounter for screening mammogram for malignant neoplasm of breast: Secondary | ICD-10-CM

## 2023-10-25 DIAGNOSIS — R768 Other specified abnormal immunological findings in serum: Secondary | ICD-10-CM

## 2023-10-25 DIAGNOSIS — J84112 Idiopathic pulmonary fibrosis: Secondary | ICD-10-CM

## 2023-10-25 DIAGNOSIS — J302 Other seasonal allergic rhinitis: Secondary | ICD-10-CM

## 2023-10-27 ENCOUNTER — Other Ambulatory Visit: Payer: Self-pay | Admitting: Primary Care

## 2023-10-27 ENCOUNTER — Other Ambulatory Visit: Payer: Self-pay

## 2023-10-27 ENCOUNTER — Other Ambulatory Visit (HOSPITAL_COMMUNITY): Payer: Self-pay

## 2023-10-27 DIAGNOSIS — J84112 Idiopathic pulmonary fibrosis: Secondary | ICD-10-CM

## 2023-10-27 MED ORDER — PIRFENIDONE 267 MG PO TABS
534.0000 mg | ORAL_TABLET | Freq: Three times a day (TID) | ORAL | 0 refills | Status: DC
  Filled 2023-10-27: qty 180, 30d supply, fill #0
  Filled 2023-12-13 – 2023-12-15 (×2): qty 180, 30d supply, fill #1
  Filled 2024-01-19: qty 180, 30d supply, fill #2

## 2023-10-27 NOTE — Progress Notes (Signed)
 Specialty Pharmacy Ongoing Clinical Assessment Note  Amy Escobar is a 78 y.o. female who is being followed by the specialty pharmacy service for RxSp Interstitial Lung Disease   Patient's specialty medication(s) reviewed today: Pirfenidone    Missed doses in the last 4 weeks: 0   Patient/Caregiver did not have any additional questions or concerns.   Therapeutic benefit summary: Patient is achieving benefit   Adverse events/side effects summary: No adverse events/side effects   Patient's therapy is appropriate to: Continue    Goals Addressed             This Visit's Progress    Maintain optimal adherence to therapy   On track    Patient is initiating therapy. Patient will maintain adherence and adhere to provider and/or lab appointments      Minimize and address adverse drug events/drug interactions   On track    Patient is initiating therapy. Patient will be monitored by provider to determine if a change in treatment plan is warranted and notify our office if any significant side effects (nausea, appetite changes, skin rashes)         Follow up:  6 months  [facility]

## 2023-10-27 NOTE — Progress Notes (Signed)
 Specialty Pharmacy Refill Coordination Note  Amy Escobar is a 78 y.o. female contacted today regarding refills of specialty medication(s) Pirfenidone    Patient requested Delivery   Delivery date: 11/02/23   Verified address: [address]   [place] [address]   Medication will be filled on 11/01/23. This fill date is pending response to refill request from provider. Patient is aware and if they have not received fill by intended date they must follow up with pharmacy.

## 2023-11-01 ENCOUNTER — Other Ambulatory Visit: Payer: Self-pay

## 2023-11-03 ENCOUNTER — Encounter (HOSPITAL_COMMUNITY): Payer: Self-pay

## 2023-11-04 ENCOUNTER — Telehealth (HOSPITAL_BASED_OUTPATIENT_CLINIC_OR_DEPARTMENT_OTHER): Payer: Self-pay | Admitting: Family

## 2023-11-04 NOTE — Telephone Encounter (Signed)
   Pt c/o of Chest Pain: STAT if active CP, including tightness, pressure, jaw pain, radiating pain to shoulder/upper arm/back, CP unrelieved by Nitro. Symptoms reported of SOB, nausea, vomiting, sweating.  1. Are you having CP right now? Not at this time-Hurting in her chest- feels like gas    2. Are you experiencing any other symptoms (ex. SOB, nausea, vomiting, sweating)? no   3. Is your CP continuous or coming and going? Comes and goes   4. Have you taken Nitroglycerin ? No-never had any   5. How long have you been experiencing CP? About 2 weeks - patient would like to be seen   6. If NO CP at time of call then end call with telling Pt to call back or call 911 if Chest pain returns prior to return call from triage team.

## 2023-11-04 NOTE — Telephone Encounter (Signed)
 Returned call to pt,    Pt states she developed gas. She has lung disease- she coughs a lot- has pain in her left side. She states she has a lot of gas in her left side up her breast & back. She states it happens when she eats sometimes & sometimes not. She states when she burps it makes the pain better. Advised pt to call her GI doctor as non cardiac related.

## 2023-11-05 ENCOUNTER — Other Ambulatory Visit: Payer: Self-pay | Admitting: Family

## 2023-11-05 ENCOUNTER — Encounter: Payer: Self-pay | Admitting: Family

## 2023-11-05 ENCOUNTER — Ambulatory Visit (INDEPENDENT_AMBULATORY_CARE_PROVIDER_SITE_OTHER): Admitting: Family

## 2023-11-05 ENCOUNTER — Ambulatory Visit (HOSPITAL_BASED_OUTPATIENT_CLINIC_OR_DEPARTMENT_OTHER)
Admission: RE | Admit: 2023-11-05 | Discharge: 2023-11-05 | Disposition: A | Discharge status: Home / Self Care | Source: Ambulatory Visit | Attending: Family | Admitting: Family

## 2023-11-05 VITALS — BP 116/68 | HR 95 | Ht 63.0 in | Wt 156.8 lb

## 2023-11-05 DIAGNOSIS — I272 Pulmonary hypertension, unspecified: Secondary | ICD-10-CM | POA: Diagnosis not present

## 2023-11-05 DIAGNOSIS — E785 Hyperlipidemia, unspecified: Secondary | ICD-10-CM

## 2023-11-05 DIAGNOSIS — I25118 Atherosclerotic heart disease of native coronary artery with other forms of angina pectoris: Secondary | ICD-10-CM

## 2023-11-05 DIAGNOSIS — J849 Interstitial pulmonary disease, unspecified: Secondary | ICD-10-CM | POA: Diagnosis not present

## 2023-11-05 DIAGNOSIS — R109 Unspecified abdominal pain: Secondary | ICD-10-CM | POA: Insufficient documentation

## 2023-11-05 NOTE — Progress Notes (Signed)
 Amy Escobar is a 78 y.o. female with the following history as recorded in EpicCare:  Patient Active Problem List   Diagnosis Date Noted   Allergic conjunctivitis of both eyes 09/23/2023   Interstitial lung disease (HCC) 06/17/2023   Acute respiratory failure with hypoxia (HCC) 06/15/2023   Polyneuropathy associated with underlying disease (HCC) 08/31/2022   Type 2 diabetes mellitus with diabetic polyneuropathy, without long-term current use of insulin  (HCC) 06/18/2021   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 without macular edema, without long-term current use of insulin  (HCC) 06/18/2021   Diabetes mellitus (HCC) 06/18/2021   Osteoarthritis of right AC (acromioclavicular) joint 03/06/2021   Osteoarthritis of right glenohumeral joint 03/06/2021   Foot pain, bilateral 12/26/2020   Diabetic neuropathy (HCC) 01/10/2020   Hav (hallux abducto valgus), unspecified laterality 01/10/2020   Chronic arthropathy 01/10/2020   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 edema associated with type 2 diabetes mellitus (HCC) 12/26/2019   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   AC (acromioclavicular) arthritis 08/02/2019   Unspecified inflammatory spondylopathy, 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   Claudication (HCC) 04/19/2019   Morbid obesity (HCC) 04/19/2019   Suspected COVID-19 virus infection 04/13/2019   Upper airway cough syndrome 08/22/2018   Greater trochanteric bursitis of both hips 06/15/2018   Trigger point of shoulder region, left 02/07/2018   Hypertension    Mild pulmonary hypertension (HCC)    NSVT (nonsustained ventricular tachycardia) (HCC)    PVC's (premature ventricular contractions)    URI (upper respiratory infection) 08/09/2016   Neck pain 02/10/2016   Urinary incontinence 02/10/2016   Degenerative arthritis of knee, bilateral 07/17/2015   Knee MCL sprain 06/07/2015   Discomfort in  chest 02/15/2015   Chest pain 02/15/2015   DM type 2, controlled, with complication (HCC)    Wellness examination 08/06/2014   Acute meniscal tear of knee 02/13/2014   Primary localized osteoarthrosis, lower leg 02/13/2014   Gastrocnemius tear 12/25/2013   UTI (urinary tract infection) 12/20/2013   Right knee pain 12/20/2013   Pulmonary hypertension (HCC) 10/06/2013   DOE (dyspnea on exertion) 08/04/2013   Dysphagia 08/10/2012   Hip pain, left 02/02/2012   Painful respiration 05/25/2011   Encounter for long-term (current) use of other medications 01/30/2011   PELVIC PAIN, LEFT 07/30/2010   TOBACCO USE, QUIT 07/07/2010   FATIGUE 12/20/2009   Headache 12/20/2009   Low back pain 12/26/2008   Disorder of liver 04/13/2008   FOOT PAIN, BILATERAL 04/13/2008   FIBROIDS, UTERUS 11/24/2007   THYROID  NODULE, LEFT 11/24/2007   HYPERCHOLESTEROLEMIA 11/24/2007   CARPAL TUNNEL SYNDROME, BILATERAL 11/24/2007   ALKALINE PHOSPHATASE, ELEVATED 11/24/2007   Gout 08/10/2007   ANXIETY 08/10/2007   Essential hypertension 08/10/2007   Cough 08/01/2007   Seasonal and perennial allergic rhinitis 01/14/2007   GERD 01/14/2007   Osteoporosis 01/14/2007    Current Outpatient Medications  Medication Sig Dispense Refill   aspirin  EC 81 MG tablet Take 81 mg by mouth daily.     azelastine  (ASTELIN ) 0.1 % nasal spray Place 2 sprays into both nostrils 2 (two) times daily. 30 mL 5   cetirizine  (ZYRTEC ) 10 MG tablet Take 0.5 tablets (5 mg total) by mouth daily. 30 tablet 5   esomeprazole  (NEXIUM ) 40 MG capsule Take 1 capsule (40 mg total) by mouth 2 (two) times daily before a meal. 180 capsule 1   fesoterodine  (TOVIAZ ) 4 MG TB24 tablet Take 4 mg by mouth daily.  fluticasone  (FLONASE ) 50 MCG/ACT nasal spray Place 2 sprays into both nostrils daily as needed for allergies or rhinitis. 16 g 5   gabapentin  (NEURONTIN ) 300 MG capsule Take 300 mg by mouth daily as needed (neuropathy).     Misc Natural Products  (MULLEIN GARLIC EAR DROPS OT) Place in ear(s).     valsartan  (DIOVAN ) 160 MG tablet Take 1 tablet (160 mg total) by mouth daily. 90 tablet 2   benzonatate  (TESSALON ) 200 MG capsule Take 1 capsule (200 mg total) by mouth 3 (three) times daily as needed for cough. (Patient not taking: Reported on 11/05/2023) 30 capsule 1   Blood Glucose Monitoring Suppl (ONETOUCH VERIO FLEX SYSTEM) w/Device KIT USE TO TEST BLOOD SUGAR DAILY AS DIRECTED (Patient not taking: Reported on 11/05/2023) 1 kit 0   Calcium  Carb-Cholecalciferol (CALCIUM  + VITAMIN D3 PO) Take 1 tablet by mouth daily. (Patient not taking: Reported on 11/05/2023)     celecoxib  (CELEBREX ) 200 MG capsule TAKE 1 CAPSULE BY MOUTH DAILY (Patient not taking: Reported on 11/05/2023) 30 capsule 1   gabapentin  (NEURONTIN ) 100 MG capsule Take 1 capsule (100 mg total) by mouth at bedtime. (Patient not taking: Reported on 11/05/2023) 90 capsule 3   glucose blood (ONETOUCH VERIO) test strip USE TO CHECK BLOOD SUGAR TWO TIMES A DAY (Patient not taking: Reported on 11/05/2023) 100 strip 3   lidocaine  (LIDODERM ) 5 % 1 patch daily. (Patient not taking: Reported on 11/05/2023)     Multiple Vitamins-Minerals (ZINC PO) Take 1 tablet by mouth daily. (Patient not taking: Reported on 11/05/2023)     Pirfenidone  267 MG TABS Take 2 tablets (534 mg total) by mouth in the morning, at noon, and at bedtime. **low dose as maintenance** (Patient not taking: Reported on 11/05/2023) 540 tablet 0   rosuvastatin  (CRESTOR ) 20 MG tablet TAKE 1 TABLET BY MOUTH DAILY 90 tablet 2   Semaglutide ,0.25 or 0.5MG /DOS, (OZEMPIC , 0.25 OR 0.5 MG/DOSE,) 2 MG/3ML SOPN 0.5 mg by Other route once a week. (Patient not taking: Reported on 11/05/2023) 9 mL 3   No current facility-administered medications for this visit.    Allergies: Benicar [olmesartan]  Past Medical History:  Diagnosis Date   Alkaline phosphatase deficiency    w/u Ne   Allergic rhinitis    Allergy     Anxiety    Arthritis    Cataract     BILATERAL-REMOVED   DM2 (diabetes mellitus, type 2) (HCC)    GERD (gastroesophageal reflux disease)    Gout    Hemorrhoids    Hyperlipidemia    Hypertension    Interstitial lung disease (HCC)    Mild pulmonary hypertension (HCC)    NSVT (nonsustained ventricular tachycardia) (HCC)    Osteoporosis    PVC's (premature ventricular contractions)    Thyroid  nodule    small   Tubular adenoma of colon 2017   Urticaria    Uterine fibroid     Past Surgical History:  Procedure Laterality Date   CATARACT EXTRACTION Bilateral 2016   COLONOSCOPY  2007   echocardiogram (other)  01/16/2002   POLYPECTOMY     removed tumors from foot nerves  04/1999   stress cardiolite   02/12/2006   TOENAIL EXCISION     TUBAL LIGATION     TYMPANOSTOMY TUBE PLACEMENT      Family History  Problem Relation Age of Onset   Heart disease Mother    Allergic rhinitis Mother    Heart disease Father    Stomach cancer Maternal Grandmother  Heart disease Maternal Grandfather    Rectal cancer Maternal Grandfather    Colon cancer Neg Hx    Breast cancer Neg Hx    Colon polyps Neg Hx    Esophageal cancer Neg Hx     Social History   Tobacco Use   Smoking status: Former    Current packs/day: 0.00    Average packs/day: 0.3 packs/day for 5.0 years (1.3 ttl pk-yrs)    Types: Cigarettes    Start date: 06/30/1963    Quit date: 06/29/1968    Years since quitting: 55.3   Smokeless tobacco: Never  Substance Use Topics   Alcohol use: No    Alcohol/week: 0.0 standard drinks of alcohol    Subjective:   Concerned about increased gas- feels that gas getting trapped in her chest and symptoms are improved when she is able to burp; has already spoken to her cardiologist and symptoms were deemed not cardiac in nature;  Has been using OTC [place] herb supplement to help with lungs;  Admits does chew sugar free gum (2-3 pieces/ day);  Does have chronic constipation- has not had a bowel movement in the past week;  typically has to use Milk of Magnesia;   She is also concerned that her chronic lung disease is worsening to the point that she cannnot manage her household duties. She is wondering if there are any resources to help her.     Objective:  Vitals:   11/05/23 0915  BP: 116/68  Pulse: 95  SpO2: 95%  Weight: 156 lb 12.8 oz (71.1 kg)  Height: 5\' 3"  (1.6 m)    General: Well developed, well nourished, in no acute distress  Skin : Warm and dry.  Head: Normocephalic and atraumatic  Eyes: Sclera and conjunctiva clear; pupils round and reactive to light; extraocular movements intact  Ears: External normal; canals clear; tympanic membranes normal  Oropharynx: Pink, supple. No suspicious lesions  Neck: Supple without thyromegaly, adenopathy  Lungs: Respirations unlabored; clear to auscultation bilaterally without wheeze, rales, rhonchi  CVS exam: normal rate and regular rhythm.  Abdomen: Soft; nontender; nondistended; normoactive bowel sounds; no masses or hepatosplenomegaly  Neurologic: Alert and oriented; speech intact; face symmetrical; moves all extremities well; CNII-XII intact without focal deficit   Assessment:  1. Abdominal pain, unspecified abdominal location   2. Pulmonary hypertension (HCC)   3. Mild pulmonary hypertension (HCC)   4. Interstitial lung disease (HCC)     Plan:  Physical exam is reassuring; ? Related to use of gum and/or constipation; will update abdominal X-ray today to rule out blockage; she will try using Milk of Magnesia with some regularity to help manage constipation- has not found Miralax  to be beneficial;  Referral to VBCI to discuss options for resources to help her in her home; she is aware that Medicare does not typically cover services for housework.   No follow-ups on file.  Orders Placed This Encounter  Procedures   DG Abd 2 Views    Standing Status:   Future    Number of Occurrences:   1    Expiration Date:   11/04/2024    Reason for Exam (SYMPTOM   OR DIAGNOSIS REQUIRED):   abdominal pain    Preferred imaging location?:   [facility]   AMB Referral VBCI Care Management    Referral Priority:   Routine    Referral Type:   Consultation    Referral Reason:   Care Coordination    Number of Visits  Requested:   1    Requested Prescriptions    No prescriptions requested or ordered in this encounter

## 2023-11-08 ENCOUNTER — Other Ambulatory Visit: Payer: Self-pay

## 2023-11-08 MED ORDER — OZEMPIC (0.25 OR 0.5 MG/DOSE) 2 MG/3ML ~~LOC~~ SOPN: 0.5000 mg | PEN_INJECTOR | SUBCUTANEOUS | 1 refills | Status: DC

## 2023-11-09 ENCOUNTER — Telehealth: Payer: Self-pay

## 2023-11-09 NOTE — Progress Notes (Signed)
 Complex Care Management Note  Care Guide Note 11/09/2023 Name: Amy Escobar MRN: [id number] DOB: Jun 21, 1946  Amy Escobar is a 78 y.o. year old female who sees Adra Alanis, FNP for primary care. I reached out to Thelbert Finner by phone today to offer complex care management services.  [name] was given information about Complex Care Management services today including:   The Complex Care Management services include support from the care team which includes your Nurse Care Manager, Clinical Social Worker, or Pharmacist.  The Complex Care Management team is here to help remove barriers to the health concerns and goals most important to you. Complex Care Management services are voluntary, and the patient may decline or stop services at any time by request to their care team member.   Complex Care Management Consent Status: Patient agreed to services and verbal consent obtained.   Follow up plan:  Telephone appointment with complex care management team member scheduled for:  11/10/23 & 11/24/23.  Encounter Outcome:  Patient Scheduled  [facility]  [facility], [facility] Care Management Assistant Direct Dial: [phone]  Fax: [phone]

## 2023-11-10 ENCOUNTER — Other Ambulatory Visit: Payer: Self-pay | Admitting: Licensed Clinical Social Worker

## 2023-11-10 ENCOUNTER — Ambulatory Visit
Admission: RE | Admit: 2023-11-10 | Discharge: 2023-11-10 | Disposition: A | Discharge status: Home / Self Care | Source: Ambulatory Visit | Attending: Family | Admitting: Family

## 2023-11-10 DIAGNOSIS — Z1231 Encounter for screening mammogram for malignant neoplasm of breast: Secondary | ICD-10-CM

## 2023-11-10 NOTE — Patient Instructions (Signed)
 Visit Information  Thank you for taking time to visit with me today. Please don't hesitate to contact me if I can be of assistance to you before our next scheduled appointment.  Our next appointment is by telephone on 11/25/2023 at 10:00 am Please call the care guide team at [phone] if you need to cancel or reschedule your appointment.   Following is a copy of your care plan:   Goals Addressed             This Visit's Progress    BSW VBCI Social Work Care Plan       Problems:   Patient wants and aid to come into the home and clean and dust  CSW Clinical Goal(s):   Over the next 2 weeks the Patient will will follow up with the resources that will be mailed  as directed by Social Work.  Interventions:  SW will mail some resources for agencies will help out in the house, SW did state that there may be an out of pocket expense. The patient will review with her children.   Patient Goals/Self-Care Activities:  Review the resources that will be mailed  Plan:   Telephone follow up appointment with care management team member scheduled for:  11/25/2023 at 10:00 am        Please call the Suicide and Crisis Lifeline: 988 go to [facility] [address], [place] [phone]) call 911 if you are experiencing a Mental Health or Behavioral Health Crisis or need someone to talk to.  Patient verbalizes understanding of instructions and care plan provided today and agrees to view in MyChart. Active MyChart status and patient understanding of how to access instructions and care plan via MyChart confirmed with patient.     Jonda Neighbours, PhD [facility], [facility] Social Worker Direct Dial: [phone]  Fax: [phone]

## 2023-11-10 NOTE — Patient Outreach (Signed)
 Complex Care Management   Visit Note  11/10/2023  Name:  Amy Escobar MRN: [id number] DOB: 05-Aug-1945  Situation: Referral received for Complex Care Management related to Home services to come in the home and clean I obtained verbal consent from Patient.  Visit completed with patient  on the phone  Background:   Past Medical History:  Diagnosis Date   Alkaline phosphatase deficiency    w/u Ne   Allergic rhinitis    Allergy     Anxiety    Arthritis    Cataract    BILATERAL-REMOVED   DM2 (diabetes mellitus, type 2) (HCC)    GERD (gastroesophageal reflux disease)    Gout    Hemorrhoids    Hyperlipidemia    Hypertension    Interstitial lung disease (HCC)    Mild pulmonary hypertension (HCC)    NSVT (nonsustained ventricular tachycardia) (HCC)    Osteoporosis    PVC's (premature ventricular contractions)    Thyroid  nodule    small   Tubular adenoma of colon 2017   Urticaria    Uterine fibroid     Assessment: Patient is wanting a service to come in the home and clean   SDOH Interventions    Flowsheet Row Clinical Support from 12/28/2022 in [facility] Documentation from 07/31/2022 in [facility] Clinical Support from 07/17/2020 in [facility] [place] Office Visit from 03/31/2019 in [facility]  SDOH Interventions      Food Insecurity Interventions -- Intervention Not Indicated Intervention Not Indicated --  Housing Interventions -- Intervention Not Indicated Intervention Not Indicated --  Transportation Interventions -- Intervention Not Indicated Intervention Not Indicated --  Utilities Interventions -- Intervention Not Indicated -- --  Alcohol Usage Interventions Intervention Not Indicated (Score <7) -- -- --  Depression Interventions/Treatment  -- -- -- PHQ2-9 Score <4 Follow-up Not Indicated  Financial Strain Interventions Intervention Not Indicated  -- Intervention Not Indicated --  Physical Activity Interventions Intervention Not Indicated -- Intervention Not Indicated --  Stress Interventions Intervention Not Indicated -- Intervention Not Indicated --  Social Connections Interventions Intervention Not Indicated -- Intervention Not Indicated --         Recommendation:   none  Follow Up Plan:   Telephone follow up appointment date/time:  11/25/2023 at 10:00 am  Jonda Neighbours, PhD [facility], [facility] Social Worker Direct Dial: [phone]  Fax: [phone]

## 2023-11-16 ENCOUNTER — Ambulatory Visit (INDEPENDENT_AMBULATORY_CARE_PROVIDER_SITE_OTHER): Payer: 59 | Admitting: Family

## 2023-11-16 ENCOUNTER — Encounter (HOSPITAL_BASED_OUTPATIENT_CLINIC_OR_DEPARTMENT_OTHER): Payer: Self-pay | Admitting: Family

## 2023-11-16 VITALS — BP 122/66 | HR 100 | Ht 64.0 in | Wt 155.4 lb

## 2023-11-16 DIAGNOSIS — I2510 Atherosclerotic heart disease of native coronary artery without angina pectoris: Secondary | ICD-10-CM | POA: Diagnosis not present

## 2023-11-16 DIAGNOSIS — I10 Essential (primary) hypertension: Secondary | ICD-10-CM | POA: Diagnosis not present

## 2023-11-16 DIAGNOSIS — E785 Hyperlipidemia, unspecified: Secondary | ICD-10-CM | POA: Diagnosis not present

## 2023-11-16 NOTE — Patient Instructions (Signed)
 Medication Instructions:  Continue your current medications.   *If you need a refill on your cardiac medications before your next appointment, please call your pharmacy*  Follow-Up: At [facility], you and your health needs are our priority.  As part of our continuing mission to provide you with exceptional heart care, our providers are all part of one team.  This team includes your primary Cardiologist (physician) and Advanced Practice Providers or APPs (Physician Assistants and Nurse Practitioners) who all work together to provide you with the care you need, when you need it.  Your next appointment:   6 month(s)  Provider:   Maudine Sos, MD, Slater Duncan, NP, or Neomi Motyka, NP    We recommend signing up for the patient portal called "MyChart".  Sign up information is provided on this After Visit Summary.  MyChart is used to connect with patients for Virtual Visits (Telemedicine).  Patients are able to view lab/test results, encounter notes, upcoming appointments, etc.  Non-urgent messages can be sent to your provider as well.   To learn more about what you can do with MyChart, go to [email or web address].   Other Instructions  For coronary artery disease often called "heart disease" we aim for optimal guideline directed medical therapy. We use the "A, B, C"s to help keep us  on track!  A = Aspirin  81mg  daily B = Blood pressure control to goal <130/80 C = Cholesterol control. You take Rosuvastatin  to help control your cholesterol.  D = Diet - recommend following a low sodium, heart healthy diet.  E = Exercise - aiming for 150 minutes of moderate intensity activity throughout the week.

## 2023-11-16 NOTE — Progress Notes (Signed)
  Cardiology Office Note:  .   Date:  11/16/2023  ID:  Amy Escobar, [name] September 02, 1945, MRN [id number] PCP: Adra Alanis, FNP  [facility] Providers Cardiologist:  Sonny Dust, MD (Inactive)    History of Present Illness: Amy Escobar   Amy Escobar is a 78 y.o. female with hx of HTN, HLD, IDDM, moderate PAH, nonobstructive coronary artery disease, palpitations.   Prior CTA 2018 calcium  score 220 placing her in the 81st percentile for age/sex matched control with moderate plaque in ostial LM and mild plaque in ostial RCA. Echo 02/2019 LVEF 55-60%, RV normal. NST 02/2022 no ischemia, LVEF 44%.   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 Discharged on home abx and home oxygen with 2 week taper of prednisone . Esbriet  held on discharge. Carvedilol  was discontinued due to relative hypotension.   Discussed the use of AI scribe software for clinical note transcription with the patient, who gave verbal consent to proceed.  History of Present Illness Amy Escobar is a 78 year old female presents today for follow up. Very involved in her church.   She feels back to her usual self following her hospitalization in December. Oxygen therapy is used primarily at night and in the evenings, with a portable tank in her car. A pulmonary function test is scheduled. Carvedilol  was discontinued in December due to hypotension. She is concerned about her heart health and currently takes rosuvastatin  and aspirin . There is no current chest pain, and her blood pressure is well-controlled.  Diabetes is managed with Ozempic , resumed after a lapse during her hospital stay. She monitors her blood sugar regularly, with a recent high reading of 300 attributed to dietary choices.    ROS: Please see the history of present illness.    All other systems reviewed and are negative.   Studies Reviewed: .           Risk Assessment/Calculations:             Physical Exam:    VS:  BP 122/66 (BP Location: Right Arm, Patient Position: Sitting, Cuff Size: Normal)   Pulse 100   Ht 5\' 4"  (1.626 m)   Wt 155 lb 6.4 oz (70.5 kg)   SpO2 93%   BMI 26.67 kg/m    Wt Readings from Last 3 Encounters:  11/16/23 155 lb 6.4 oz (70.5 kg)  11/05/23 156 lb 12.8 oz (71.1 kg)  09/23/23 156 lb (70.8 kg)    GEN: Well nourished, well developed in no acute distress NECK: No JVD; No carotid bruits CARDIAC: RRR, no murmurs, rubs, gallops RESPIRATORY:  Clear to auscultation without rales, wheezing or rhonchi  ABDOMEN: Soft, non-tender, non-distended EXTREMITIES:  No edema; No deformity   ASSESSMENT AND PLAN: .    Nonobstructive CAD Stable with no new symptoms. 05/2022 LDL 71.  -Continue Aspirin  81mg  daily, Rosuvastatin  20mg  daily. -Recommend aiming for 150 minutes of moderate intensity activity per week and following a heart healthy diet.    Pulmonary Hypertension / ILD Upcoming PFT and .fuw with Dr. Bertrum Brodie -Management per pulmonology  DM2 Follows with endocrinology. Appreciate inclusion of GLP1 for cardioprotective benefit.   HTN BP controlled. continue present antihypertensive regimen Valsartan  160mg  daily.   Follow-up in 6 months with Dr. Theodis Fiscal or APP     Signed, Clearnce Curia, NP

## 2023-11-18 ENCOUNTER — Other Ambulatory Visit: Payer: Self-pay

## 2023-11-24 ENCOUNTER — Ambulatory Visit: Admitting: Internal Medicine

## 2023-11-24 ENCOUNTER — Other Ambulatory Visit: Payer: Self-pay | Admitting: Physician Assistant

## 2023-11-24 ENCOUNTER — Other Ambulatory Visit: Payer: Self-pay

## 2023-11-24 DIAGNOSIS — R768 Other specified abnormal immunological findings in serum: Secondary | ICD-10-CM

## 2023-11-24 DIAGNOSIS — J302 Other seasonal allergic rhinitis: Secondary | ICD-10-CM

## 2023-11-24 DIAGNOSIS — J84112 Idiopathic pulmonary fibrosis: Secondary | ICD-10-CM | POA: Diagnosis not present

## 2023-11-24 DIAGNOSIS — R053 Chronic cough: Secondary | ICD-10-CM

## 2023-11-24 LAB — PULMONARY FUNCTION TEST
DL/VA % pred: 74 %
DL/VA: 3.05 ml/min/mmHg/L
DLCO cor % pred: 42 %
DLCO cor: 7.98 ml/min/mmHg
DLCO unc % pred: 42 %
DLCO unc: 7.98 ml/min/mmHg
FEF 25-75 Pre: 1.98 L/s
FEF2575-%Pred-Pre: 130 %
FEV1-%Pred-Pre: 72 %
FEV1-Pre: 1.45 L
FEV1FVC-%Pred-Pre: 115 %
FEV6-%Pred-Pre: 66 %
FEV6-Pre: 1.7 L
FEV6FVC-%Pred-Pre: 105 %
FVC-%Pred-Pre: 62 %
FVC-Pre: 1.7 L
Pre FEV1/FVC ratio: 86 %
Pre FEV6/FVC Ratio: 100 %

## 2023-11-24 NOTE — Patient Outreach (Signed)
 Complex Care Management   Visit Note  11/24/2023  Name:  Amy Escobar MRN: [id number] DOB: October 19, 1945  Situation: Referral received for Complex Care Management related to Diabetes with Complications and Pulmonary Disease, HTN I obtained verbal consent from Patient.  Visit completed with patient  on the phone  Background:   Past Medical History:  Diagnosis Date   Alkaline phosphatase deficiency    w/u Ne   Allergic rhinitis    Allergy     Anxiety    Arthritis    Cataract    BILATERAL-REMOVED   DM2 (diabetes mellitus, type 2) (HCC)    GERD (gastroesophageal reflux disease)    Gout    Hemorrhoids    Hyperlipidemia    Hypertension    Interstitial lung disease (HCC)    Mild pulmonary hypertension (HCC)    NSVT (nonsustained ventricular tachycardia) (HCC)    Osteoporosis    PVC's (premature ventricular contractions)    Thyroid  nodule    small   Tubular adenoma of colon 2017   Urticaria    Uterine fibroid     Assessment: Patient Reported Symptoms:  Cognitive Cognitive Status: Alert and oriented to person, place, and time      Neurological Neurological Review of Symptoms: Numbness (bilateral feet and fingers diabetic neuropathy) Neurological Management Strategies: Medication therapy, Routine screening, Coping strategies  HEENT HEENT Symptoms Reported: No symptoms reported      Cardiovascular Cardiovascular Symptoms Reported: No symptoms reported Does patient have uncontrolled Hypertension?: Yes Is patient checking Blood Pressure at home?: Yes Patient's Recent BP reading at home: 125/83 Cardiovascular Conditions: Hypertension, High blood cholesterol, Coronary artery disease Cardiovascular Management Strategies: Medication therapy, Routine screening, Coping strategies  Respiratory Respiratory Symptoms Reported: Productive cough Other Respiratory Symptoms: interstitial lung disease Additional Respiratory Details: treated by Pulmonology Respiratory Conditions:  Shortness of breath Respiratory Comment: PFT scheduled 11/24/23  Pulmonology 12/10/23  Endocrine Patient reports the following symptoms related to hypoglycemia or hyperglycemia : No symptoms reported Is patient diabetic?: Yes Is patient checking blood sugars at home?: Yes Endocrine Conditions: Diabetes Endocrine Management Strategies: Weight management, Medical device, Medication therapy, Routine screening, Diet modification, Adequate rest, Coping strategies  Gastrointestinal Gastrointestinal Symptoms Reported: Constipation Additional Gastrointestinal Details: takes Milk of Magnesia as need Gastrointestinal Conditions: Constipation Gastrointestinal Management Strategies: Medication therapy Nutrition Risk Screen (CP): No indicators present  Genitourinary Genitourinary Symptoms Reported: No symptoms reported, Other Other Genitourinary Symptoms: unable to fully empty bladder  performs self cath Additional Genitourinary Details: sees urology provider Genitourinary Conditions: Difficulty voiding Genitourinary Management Strategies: Catheter, straight, Coping strategies Straight Catheter Frequency: 4 times a day  Integumentary Integumentary Symptoms Reported: No symptoms reported    Musculoskeletal Musculoskelatal Symptoms Reviewed: Difficulty walking Additional Musculoskeletal Details: bilateral knee pain Musculoskeletal Conditions: Joint pain Musculoskeletal Management Strategies: Medical device, Medication therapy, Routine screening Falls in the past year?: No    Psychosocial Psychosocial Symptoms Reported: Anxiety - if selected complete GAD Additional Psychological Details: patient reports stress and anxiety about financial concerns  Referral placed to [facility] Behavioral Management Strategies: Coping strategies, Community resources          12/28/2022    9:08 AM  Depression screen PHQ 2/9  Decreased Interest 0  Down, Depressed, Hopeless 0  PHQ - 2 Score 0    Vitals:   11/24/23 0918   BP: 125/83  Pulse: 81    Medications Reviewed Today     Reviewed by Clarnce Crow, RN (Registered Nurse) on 11/24/23 at [phone]  Med List Status: <None>  Medication Order Taking? Sig Documenting Provider Last Dose Status Informant  aspirin  EC 81 MG tablet [id number] Yes Take 81 mg by mouth daily. [provider] Taking Active Self, Pharmacy Records  azelastine  (ASTELIN ) 0.1 % nasal spray [id number] Yes Place 2 sprays into both nostrils 2 (two) times daily. Ardie Kras, FNP Taking Active   benzonatate  (TESSALON ) 200 MG capsule [id number] Yes Take 1 capsule (200 mg total) by mouth 3 (three) times daily as needed for cough. Wilfredo Hanly, MD Taking Active   Blood Glucose Monitoring Suppl [facility] VERIO FLEX SYSTEM) w/Device Suzanne Erps [id number] Yes USE TO TEST BLOOD SUGAR DAILY AS DIRECTED Shamleffer, Julian Obey, MD Taking Active Self, Pharmacy Records  Calcium  Carb-Cholecalciferol (CALCIUM  + VITAMIN D3 PO) [id number] Yes Take 1 tablet by mouth daily. [provider] Taking Active Self, Pharmacy Records  celecoxib  (CELEBREX ) 200 MG capsule [id number] Yes TAKE 1 CAPSULE BY MOUTH DAILY Bronson Canny, PA-C Taking Active            Med Note Burley Carpenter, Aayushi Solorzano   Wed Nov 24, 2023  9:23 AM) Patient taking PRN, last taken last week  cetirizine  (ZYRTEC ) 10 MG tablet [id number] Yes Take 0.5 tablets (5 mg total) by mouth daily. Ardie Kras, FNP Taking Active   esomeprazole  (NEXIUM ) 40 MG capsule [id number] Yes Take 1 capsule (40 mg total) by mouth 2 (two) times daily before a meal. Adra Alanis, FNP Taking Active   fesoterodine  (TOVIAZ ) 4 MG TB24 tablet [id number] Yes Take 4 mg by mouth daily. [provider] Taking Active Self, Pharmacy Records  fluticasone  (FLONASE ) 50 MCG/ACT nasal spray [id number] Yes Place 2 sprays into both nostrils daily as needed for allergies or rhinitis. Ardie Kras, FNP Taking Active   gabapentin  (NEURONTIN ) 100 MG capsule [id number] Yes Take 1  capsule (100 mg total) by mouth at bedtime. Shamleffer, Julian Obey, MD Taking Active Self, Pharmacy Records           Med Note (COFFELL, Stan Eans   Wed Jun 16, 2023 11:41 AM) Pt states she uses 2 strengths of gabapentin .  gabapentin  (NEURONTIN ) 300 MG capsule [id number] Yes Take 300 mg by mouth daily as needed (neuropathy). [provider] Taking Active Self, Pharmacy Records  glucose blood [facility] VERIO) test strip [id number] Yes USE TO CHECK BLOOD SUGAR TWO TIMES A DAY Shamleffer, Julian Obey, MD Taking Active   lidocaine  (LIDODERM ) 5 % [id number] Yes 1 patch daily. [provider] Taking Active   Misc Natural Products (MULLEIN GARLIC EAR DROPS OT) [id number]  Place in ear(s). [provider]  Consider Medication Status and Discontinue (Patient Preference)   Multiple Vitamins-Minerals (ZINC PO) [id number]  Take 1 tablet by mouth daily. [provider]  Active Self, Pharmacy Records  Pirfenidone  267 MG TABS [id number] Yes Take 2 tablets (534 mg total) by mouth in the morning, at noon, and at bedtime. **low dose as maintenanceAntonio Baumgarten, NP Taking Active   rosuvastatin  (CRESTOR ) 20 MG tablet [id number] Yes TAKE 1 TABLET BY MOUTH DAILY Walker, Caitlin S, NP Taking Active   Semaglutide ,0.25 or 0.5MG /DOS, (OZEMPIC , 0.25 OR 0.5 MG/DOSE,) 2 MG/3ML SOPN [id number] Yes 0.5 mg by Other route once a week. Shamleffer, Ibtehal Jaralla, MD Taking Active   valsartan  (DIOVAN ) 160 MG tablet [id number] Yes Take 1 tablet (160 mg total) by mouth daily. Clearnce Curia, NP Taking Active             Recommendation:   PCP Follow-up  Follow Up Plan:  Telephone follow up appointment date/time:  12/02/23 at 3:30   Clarnce Crow BSN RN CCM [place]  [facility], [facility] RN Care Manager Direct Dial: [phone] Fax: [phone]

## 2023-11-24 NOTE — Progress Notes (Signed)
Spirometry/dlco performed today. 

## 2023-11-24 NOTE — Patient Instructions (Signed)
 Visit Information  Thank you for taking time to visit with me today. Please don't hesitate to contact me if I can be of assistance to you before our next scheduled appointment.  Our next appointment is by telephone on 12/02/23 at 3;30 Please call the care guide team at [phone] if you need to cancel or reschedule your appointment.   Following is a copy of your care plan:   Goals Addressed             This Visit's Progress    VBCI RN Care Plan       Problems:  Care Coordination needs related to Financial Strain  and Housing  Chronic Disease Management support and education needs related to DMII, HTN, and Pulmonary Disease Lacks caregiver support.  Goal: Over the next 30 days the Patient will continue to work with Medical illustrator and/or Social Worker to address care management and care coordination needs related to DMII, HTN, and Pulmonary Disease as evidenced by adherence to care management team scheduled appointments     demonstrate Ongoing adherence to prescribed treatment plan for DMII, HTN, and Pulmonary Disease as evidenced by taking all medications as prescribed, completing all ordered tests, attending all medical appointments take all medications exactly as prescribed and will call provider for medication related questions as evidenced by chart review and patient report    work with Child psychotherapist to address ADL IADL limitations, Financial constraints related to obtaining personal aid assistance, Lacks knowledge of community resource: insurance benefits, Limited social support, Social Isolation, and ability to live independently related to the management of DMII, HTN, and Pulmonary Disease as evidenced by review of electronic medical record and patient or social worker report      Interventions:   Diabetes Interventions: Assessed patient's understanding of A1c goal: <6.5% Provided education to patient about basic DM disease process Reviewed medications with patient and  discussed importance of medication adherence Counseled on importance of regular laboratory monitoring as prescribed Discussed plans with patient for ongoing care management follow up and provided patient with direct contact information for care management team Review of patient status, including review of consultants reports, relevant laboratory and other test results, and medications completed Screening for signs and symptoms of depression related to chronic disease state  Assessed social determinant of health barriers Lab Results  Component Value Date   HGBA1C 5.8 (A) 09/07/2023    Hypertension Interventions: Last practice recorded BP readings:  BP Readings from Last 3 Encounters:  11/24/23 125/83  11/16/23 122/66  11/05/23 116/68   Most recent eGFR/CrCl:  Lab Results  Component Value Date   EGFR 75 01/21/2023    No components found for: "CRCL"  Evaluation of current treatment plan related to hypertension self management and patient's adherence to plan as established by provider Provided education to patient re: stroke prevention, s/s of heart attack and stroke Reviewed medications with patient and discussed importance of compliance Discussed plans with patient for ongoing care management follow up and provided patient with direct contact information for care management team Advised patient, providing education and rationale, to monitor blood pressure daily and record, calling PCP for findings outside established parameters Reviewed scheduled/upcoming provider appointments including:  Discussed complications of poorly controlled blood pressure such as heart disease, stroke, circulatory complications, vision complications, kidney impairment, sexual dysfunction Screening for signs and symptoms of depression related to chronic disease state  Assessed social determinant of health barriers  Patient Self-Care Activities:  Attend all scheduled provider appointments Attend church  or  other social activities Call pharmacy for medication refills 3-7 days in advance of running out of medications Call provider office for new concerns or questions  Take medications as prescribed   Work with the social worker to address care coordination needs and will continue to work with the clinical team to address health care and disease management related needs  Plan:  Telephone follow up appointment with care management team member scheduled for:  12/02/23             Please call the Suicide and Crisis Lifeline: 988 call the [place]  National Suicide Prevention Lifeline: 360-763-0529 or TTY: 289-185-0418 TTY 802-700-0467) to talk to a trained counselor call 1-800-273-TALK (toll free, 24 hour hotline) if you are experiencing a Mental Health or Behavioral Health Crisis or need someone to talk to.  Patient verbalizes understanding of instructions and care plan provided today and agrees to view in MyChart. Active MyChart status and patient understanding of how to access instructions and care plan via MyChart confirmed with patient.      Clarnce Crow BSN RN CCM [place]  [facility], [facility] RN Care Manager Direct Dial: [phone] Fax: [phone]

## 2023-11-24 NOTE — Patient Instructions (Signed)
Spirometry/dlco performed today. 

## 2023-11-25 ENCOUNTER — Other Ambulatory Visit: Payer: Self-pay | Admitting: Licensed Clinical Social Worker

## 2023-11-25 NOTE — Patient Instructions (Signed)

## 2023-11-25 NOTE — Patient Outreach (Signed)
 Complex Care Management   Visit Note  11/25/2023  Name:  Amy Escobar MRN: [id number] DOB: [birth year]  Situation: Referral received for Complex Care Management related to SDOH Barriers:  Resources for house cleaning  I obtained verbal consent from Patient.  Visit completed with patient  on the phone  Background:   Past Medical History:  Diagnosis Date   Alkaline phosphatase deficiency    w/u Ne   Allergic rhinitis    Allergy     Anxiety    Arthritis    Cataract    BILATERAL-REMOVED   DM2 (diabetes mellitus, type 2) (HCC)    GERD (gastroesophageal reflux disease)    Gout    Hemorrhoids    Hyperlipidemia    Hypertension    Interstitial lung disease (HCC)    Mild pulmonary hypertension (HCC)    NSVT (nonsustained ventricular tachycardia) (HCC)    Osteoporosis    PVC's (premature ventricular contractions)    Thyroid  nodule    small   Tubular adenoma of colon 2017   Urticaria    Uterine fibroid     Assessment: Patient was satisfied with the resources that she received for house cleaning   SDOH Interventions    Flowsheet Row Patient Outreach Telephone from 11/24/2023 in [place] [facility]PART[facility] Clinical Support from 12/28/2022 in [facility] Documentation from 07/31/2022 in [facility] Clinical Support from 07/17/2020 in [facility] [place] Office Visit from 03/31/2019 in [facility]  SDOH Interventions       Food Insecurity Interventions Intervention Not Indicated -- Intervention Not Indicated Intervention Not Indicated --  Housing Interventions Intervention Not Indicated -- Intervention Not Indicated Intervention Not Indicated --  Transportation Interventions Intervention Not Indicated -- Intervention Not Indicated Intervention Not Indicated --  Utilities Interventions Intervention Not Indicated -- Intervention Not Indicated -- --  Alcohol  Usage Interventions -- Intervention Not Indicated (Score <7) -- -- --  Depression Interventions/Treatment  -- -- -- -- PHQ2-9 Score <4 Follow-up Not Indicated  Financial Strain Interventions -- Intervention Not Indicated -- Intervention Not Indicated --  Physical Activity Interventions -- Intervention Not Indicated -- Intervention Not Indicated --  Stress Interventions -- Intervention Not Indicated -- Intervention Not Indicated --  Social Connections Interventions -- Intervention Not Indicated -- Intervention Not Indicated --       Recommendation:   none  Follow Up Plan:   Closing From:  Complex Care Management  Jonda Neighbours, PhD [facility], [facility] Social Worker Direct Dial: [phone]  Fax: [phone]

## 2023-11-30 ENCOUNTER — Ambulatory Visit: Payer: 59 | Admitting: Family

## 2023-12-02 ENCOUNTER — Other Ambulatory Visit: Payer: Self-pay

## 2023-12-02 NOTE — Patient Outreach (Signed)
 Complex Care Management   Visit Note  12/02/2023  Name:  Amy Escobar MRN: [id number] DOB: Sep 27, 1945  Situation: Referral received for Complex Care Management related to Pulmonary Disease I obtained verbal consent from Patient.  Visit completed with patient  on the phone  Background:   Past Medical History:  Diagnosis Date   Alkaline phosphatase deficiency    w/u Ne   Allergic rhinitis    Allergy     Anxiety    Arthritis    Cataract    BILATERAL-REMOVED   DM2 (diabetes mellitus, type 2) (HCC)    GERD (gastroesophageal reflux disease)    Gout    Hemorrhoids    Hyperlipidemia    Hypertension    Interstitial lung disease (HCC)    Mild pulmonary hypertension (HCC)    NSVT (nonsustained ventricular tachycardia) (HCC)    Osteoporosis    PVC's (premature ventricular contractions)    Thyroid  nodule    small   Tubular adenoma of colon 2017   Urticaria    Uterine fibroid     Assessment: Patient Reported Symptoms:  Cognitive Cognitive Status: Alert and oriented to person, place, and time      Neurological Neurological Review of Symptoms: No symptoms reported    HEENT HEENT Symptoms Reported: No symptoms reported      Cardiovascular Cardiovascular Symptoms Reported: No symptoms reported Does patient have uncontrolled Hypertension?: Yes Is patient checking Blood Pressure at home?: Yes Patient's Recent BP reading at home: 124/89    Respiratory Respiratory Symptoms Reported: Productive cough Other Respiratory Symptoms: patient reports upper back muscle pain due to coughing. recomend moist heat, tylenol  and rest.  patient also takes Tesselon as needed.    Endocrine Patient reports the following symptoms related to hypoglycemia or hyperglycemia : No symptoms reported Is patient diabetic?: Yes Is patient checking blood sugars at home?: Yes Endocrine Conditions: Diabetes Endocrine Management Strategies: Medication therapy, Medical device, Routine screening, Diet  modification  Gastrointestinal Gastrointestinal Symptoms Reported: No symptoms reported Gastrointestinal Management Strategies: Medication therapy    Genitourinary Genitourinary Symptoms Reported: No symptoms reported    Integumentary Integumentary Symptoms Reported: No symptoms reported    Musculoskeletal Musculoskelatal Symptoms Reviewed: Difficulty walking Additional Musculoskeletal Details: knees feel "pretty good even though its raining" Musculoskeletal Management Strategies: Medication therapy, Routine screening Falls in the past year?: No    Psychosocial Psychosocial Symptoms Reported: No symptoms reported     Do you feel physically threatened by others?: No      12/28/2022    9:08 AM  Depression screen PHQ 2/9  Decreased Interest 0  Down, Depressed, Hopeless 0  PHQ - 2 Score 0    Vitals:   12/02/23 1507  BP: 124/89  Pulse: 89  SpO2: 95%    Medications Reviewed Today     Reviewed by Clarnce Crow, RN (Registered Nurse) on 12/02/23 at 1509  Med List Status: <None>   Medication Order Taking? Sig Documenting Provider Last Dose Status Informant  aspirin  EC 81 MG tablet [id number] No Take 81 mg by mouth daily. [provider] Taking Active Self, Pharmacy Records  azelastine  (ASTELIN ) 0.1 % nasal spray [id number] No Place 2 sprays into both nostrils 2 (two) times daily. Ardie Kras, FNP Taking Active   benzonatate  (TESSALON ) 200 MG capsule [id number] No Take 1 capsule (200 mg total) by mouth 3 (three) times daily as needed for cough. Wilfredo Hanly, MD Taking Active   Blood Glucose Monitoring Suppl [facility] VERIO FLEX SYSTEM) w/Device KIT [id number] No USE  TO TEST BLOOD SUGAR DAILY AS DIRECTED Shamleffer, Julian Obey, MD Taking Active Self, Pharmacy Records  Calcium  Carb-Cholecalciferol (CALCIUM  + VITAMIN D3 PO) [id number] No Take 1 tablet by mouth daily. [provider] Taking Active Self, Pharmacy Records  celecoxib  (CELEBREX ) 200 MG capsule  [id number]  TAKE 1 CAPSULE BY MOUTH DAILY Bronson Canny, PA-C  Active   cetirizine  (ZYRTEC ) 10 MG tablet [id number] No Take 0.5 tablets (5 mg total) by mouth daily. Ardie Kras, FNP Taking Active   esomeprazole  (NEXIUM ) 40 MG capsule [id number] No Take 1 capsule (40 mg total) by mouth 2 (two) times daily before a meal. Adra Alanis, FNP Taking Active   fesoterodine  (TOVIAZ ) 4 MG TB24 tablet [id number] No Take 4 mg by mouth daily. [provider] Taking Active Self, Pharmacy Records  fluticasone  (FLONASE ) 50 MCG/ACT nasal spray [id number] No Place 2 sprays into both nostrils daily as needed for allergies or rhinitis. Ardie Kras, FNP Taking Active   gabapentin  (NEURONTIN ) 100 MG capsule [id number] No Take 1 capsule (100 mg total) by mouth at bedtime. Shamleffer, Julian Obey, MD Taking Active Self, Pharmacy Records           Med Note (COFFELL, Stan Eans   Wed Jun 16, 2023 11:41 AM) Pt states she uses 2 strengths of gabapentin .  gabapentin  (NEURONTIN ) 300 MG capsule [id number] No Take 300 mg by mouth daily as needed (neuropathy). [provider] Taking Active Self, Pharmacy Records  glucose blood [facility] VERIO) test strip [id number] No USE TO CHECK BLOOD SUGAR TWO TIMES A DAY Shamleffer, Julian Obey, MD Taking Active   lidocaine  (LIDODERM ) 5 % [id number] No 1 patch daily. [provider] Taking Active   Misc Natural Products (MULLEIN GARLIC EAR DROPS OT) [id number] No Place in ear(s). [provider] Taking Active   Multiple Vitamins-Minerals (ZINC PO) [id number] No Take 1 tablet by mouth daily. [provider] Taking Active Self, Pharmacy Records  Pirfenidone  267 MG TABS [id number] No Take 2 tablets (534 mg total) by mouth in the morning, at noon, and at bedtime. **low dose as maintenanceAntonio Baumgarten, NP Taking Active   rosuvastatin  (CRESTOR ) 20 MG tablet [id number] No TAKE 1 TABLET BY MOUTH DAILY Walker, Caitlin S, NP Taking Active    Semaglutide ,0.25 or 0.5MG /DOS, (OZEMPIC , 0.25 OR 0.5 MG/DOSE,) 2 MG/3ML SOPN [id number] No 0.5 mg by Other route once a week. Shamleffer, Ibtehal Jaralla, MD Taking Active   valsartan  (DIOVAN ) 160 MG tablet [id number] No Take 1 tablet (160 mg total) by mouth daily. Clearnce Curia, NP Taking Active             Recommendation:   Continue Current Plan of Care  Follow Up Plan:   Telephone follow up appointment date/time:  12/23/23   Clarnce Crow BSN RN CCM [place]  [facility], [facility] RN Care Manager Direct Dial: [phone] Fax: [phone]

## 2023-12-02 NOTE — Patient Instructions (Signed)
 Visit Information  Thank you for taking time to visit with me today. Please don't hesitate to contact me if I can be of assistance to you before our next scheduled appointment.  Our next appointment is by telephone on 12/23/23 at 3:00 Please call the care guide team at [phone] if you need to cancel or reschedule your appointment.   Following is a copy of your care plan:   Goals Addressed             This Visit's Progress    VBCI RN Care Plan   On track    Problems:  Chronic Disease Management support and education needs related to DMII, HTN, and Pulmonary Disease  Goal: Over the next 30 days the Patient will continue to work with Medical illustrator and/or Social Worker to address care management and care coordination needs related to DMII, HTN, and Pulmonary Disease as evidenced by adherence to care management team scheduled appointments     demonstrate Ongoing adherence to prescribed treatment plan for DMII, HTN, and Pulmonary Disease as evidenced by taking all medications as prescribed, completing all ordered tests, attending all medical appointments take all medications exactly as prescribed and will call provider for medication related questions as evidenced by chart review and patient report    work with Child psychotherapist to address ADL IADL limitations, Financial constraints related to obtaining personal aid assistance, Lacks knowledge of community resource: insurance benefits, Limited social support, Social Isolation, and ability to live independently related to the management of DMII, HTN, and Pulmonary Disease as evidenced by review of electronic medical record and patient or social worker report      Interventions:   Diabetes Interventions: Assessed patient's understanding of A1c goal: <6.5% Provided education to patient about basic DM disease process Reviewed medications with patient and discussed importance of medication adherence Counseled on importance of regular laboratory  monitoring as prescribed Discussed plans with patient for ongoing care management follow up and provided patient with direct contact information for care management team Review of patient status, including review of consultants reports, relevant laboratory and other test results, and medications completed Screening for signs and symptoms of depression related to chronic disease state  Assessed social determinant of health barriers Lab Results  Component Value Date   HGBA1C 5.8 (A) 09/07/2023    Hypertension Interventions: Last practice recorded BP readings:  BP Readings from Last 3 Encounters:  12/02/23 124/89  11/24/23 125/83  11/16/23 122/66   Most recent eGFR/CrCl:  Lab Results  Component Value Date   EGFR 75 01/21/2023    No components found for: "CRCL"  Evaluation of current treatment plan related to hypertension self management and patient's adherence to plan as established by provider Provided education to patient re: stroke prevention, s/s of heart attack and stroke Reviewed medications with patient and discussed importance of compliance Discussed plans with patient for ongoing care management follow up and provided patient with direct contact information for care management team Advised patient, providing education and rationale, to monitor blood pressure daily and record, calling PCP for findings outside established parameters Reviewed scheduled/upcoming provider appointments including:  Discussed complications of poorly controlled blood pressure such as heart disease, stroke, circulatory complications, vision complications, kidney impairment, sexual dysfunction Screening for signs and symptoms of depression related to chronic disease state  Assessed social determinant of health barriers  Patient Self-Care Activities:  Attend all scheduled provider appointments Attend church or other social activities Call pharmacy for medication refills 3-7 days in advance  of running  out of medications Call provider office for new concerns or questions  Take medications as prescribed   Work with the social worker to address care coordination needs and will continue to work with the clinical team to address health care and disease management related needs  Plan:  Telephone follow up appointment with care management team member scheduled for:  12/23/23             Please call the Suicide and Crisis Lifeline: 988 call the [place]  National Suicide Prevention Lifeline: 872-261-8711 or TTY: 608-491-8293 TTY (534) 242-0360) to talk to a trained counselor call 1-800-273-TALK (toll free, 24 hour hotline) if you are experiencing a Mental Health or Behavioral Health Crisis or need someone to talk to.  Patient verbalizes understanding of instructions and care plan provided today and agrees to view in MyChart. Active MyChart status and patient understanding of how to access instructions and care plan via MyChart confirmed with patient.      Clarnce Crow BSN RN CCM [place]  [facility], [facility] RN Care Manager Direct Dial: [phone] Fax: [phone]

## 2023-12-10 ENCOUNTER — Ambulatory Visit (INDEPENDENT_AMBULATORY_CARE_PROVIDER_SITE_OTHER): Admitting: Internal Medicine

## 2023-12-10 ENCOUNTER — Encounter: Payer: Self-pay | Admitting: Internal Medicine

## 2023-12-10 VITALS — BP 124/70 | HR 85 | Ht 64.0 in | Wt 155.0 lb

## 2023-12-10 DIAGNOSIS — J84112 Idiopathic pulmonary fibrosis: Secondary | ICD-10-CM | POA: Diagnosis not present

## 2023-12-10 DIAGNOSIS — R768 Other specified abnormal immunological findings in serum: Secondary | ICD-10-CM

## 2023-12-10 DIAGNOSIS — J302 Other seasonal allergic rhinitis: Secondary | ICD-10-CM

## 2023-12-10 DIAGNOSIS — Z87891 Personal history of nicotine dependence: Secondary | ICD-10-CM

## 2023-12-10 DIAGNOSIS — K219 Gastro-esophageal reflux disease without esophagitis: Secondary | ICD-10-CM | POA: Diagnosis not present

## 2023-12-10 DIAGNOSIS — Z5181 Encounter for therapeutic drug level monitoring: Secondary | ICD-10-CM | POA: Diagnosis not present

## 2023-12-10 DIAGNOSIS — J3089 Other allergic rhinitis: Secondary | ICD-10-CM

## 2023-12-10 NOTE — Patient Instructions (Addendum)
 ILD (interstitial lung disease) (HCC) IPF (idiopathic pulmonary fibrosis) (HCC)   -You had intolerance to high-dose Esbriet .  You are doing well with low-dose Esbriet  protocol -Course characterized by pneumonia admission in December 2024 - Disease is progressive on PFT and CT as of summer 2025 v summer 2025   Plan  -Continue low-dose  pirfenidone  per protocol  - Check LFT 12/10/2023 -  0- recommended change to OFEV but given MI risk respect you declining  -Clinical trial considerations in the future'   - Consider IPF-pro registry study - take ICF 12/10/2023  - COnsider IV trial in the fall 2025   - Do spirometry and DLCO in the next 3 months  - CMA to qualify your for POC   GERD  Plan  - Address at next visit including Fosamax  use  Seasonal allergies Elevated IgE level House dust mite allergy   -You did see Dr. Tempie Fee in November 2024.  She recommended that if the cough is not controlled that she would need to do skin allergy  testing and then consider allergy  shots  Plan   -Per Dr Verdon Glance   Follow-up -12 weeks-week visit with Dr. Bertrum Brodie o 30-minute to discuss  = Symptom score and exercise hypoxemia test at follow-up

## 2023-12-10 NOTE — Progress Notes (Signed)
 OV 04/20/2023 -of care to the ILD center.  Referred by Dr. Matilde Son.  Subjective:  Patient ID: Amy Escobar, female , DOB: 09/01/1945 , age 78 y.o. , MRN: [id number] , ADDRESS: [address] [place] [address] PCP Adra Alanis, FNP Patient Care Team: Adra Alanis, FNP as PCP - General (Internal Medicine) Sonny Dust, MD (Inactive) as PCP - Cardiology (Cardiology) Thomos Flies, MD (Internal Medicine) Ivin Marrow, PA-C as Physician Assistant (Obstetrics and Gynecology) Arty Binning, MD (Inactive) as Consulting Physician (Cardiology) Claudette Cue, MD (Inactive) as Consulting Physician (Gastroenterology) Seward Dao Alleen Arbour, MD as Consulting Physician (Ophthalmology) Diamond Formica, MD as Consulting Physician (Pulmonary Disease) Isidro Margo, DO as Attending Physician (Family Medicine) Ruffin Cotton, DPM as Consulting Physician (Podiatry)  This Provider for this visit: Treatment Team:  Attending Provider: Maire Scot, MD    04/20/2023 -   Chief Complaint  Patient presents with   Follow-up    Pt was put on symbicort  and she does take it everyday. Pt is f/u Ild has concerns about the scarring      HPI Amy Escobar 78 y.o. -I am meeting for the first time.  Presents with daughter Amy Escobar.  Both independent historians.  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  As best as I can gather patient is to be followed by Dr. Vernestine Gondola.  4 years ago there was significant allergy  testing positivity with seasonal allergy  positive to grass and dust including dust mite and Alternaria and pet dander.  She also has a elevated IgE greater than 1000 sometimes even 2000.  She has been having cough for the last 5 to 6 years present both day and night.  Is associated with acid reflux decreased by Mucinex  considered moderate to severe.  Also shortness of breath for the last 2 or 3 years years  particular when she climbs stairs but not for other simple activities of daily living.  She saw Dr. Matilde Son who did a high-resolution CT chest.  See below.  I personally visualized his agree with probable UIP pattern.  Therefore she has been referred here.  She denies any mold exposure of bird feather or down comforter exposure. That she is noticed to have acid reflux but also on Fosamax .   She has had serologies trace positive for rheumatoid factor but otherwise negative.  - Medications reviewed.      CT Chest data from date:   ive & Impression  CLINICAL DATA:  Abnormal chest radiograph. Evaluate for interstitial lung disease.   EXAM: CT CHEST WITHOUT CONTRAST   TECHNIQUE: Multidetector CT imaging of the chest was performed following the standard protocol without intravenous contrast. High resolution imaging of the lungs, as well as inspiratory and expiratory imaging, was performed.   RADIATION DOSE REDUCTION: This exam was performed according to the departmental dose-optimization program which includes automated exposure control, adjustment of the mA and/or kV according to patient size and/or use of iterative reconstruction technique.   COMPARISON:  01/14/2023 chest radiograph.   FINDINGS: Cardiovascular: Normal heart size. No significant pericardial effusion/thickening. Left anterior descending coronary atherosclerosis. Atherosclerotic nonaneurysmal thoracic aorta. Normal caliber pulmonary arteries.   Mediastinum/Nodes: No significant thyroid  nodules. Unremarkable esophagus. No pathologically enlarged axillary, mediastinal or hilar lymph nodes, noting limited sensitivity for the detection of hilar adenopathy on this noncontrast study.   Lungs/Pleura: No pneumothorax. No pleural effusion. No acute consolidative airspace disease, lung  masses or significant pulmonary nodules. No significant lobular air trapping or evidence of tracheobronchomalacia on the expiration  sequence. Moderate patchy confluent subpleural reticulation and ground-glass opacity throughout both lungs with associated moderate traction bronchiectasis and architectural distortion. There is a mild basilar predominance to these findings. No frank honeycombing. Comparison to the 11/10/2021 chest CT study is limited by prominent motion degradation on the prior scan, favor mild interval progression.   Upper abdomen: No acute abnormality.   Musculoskeletal: No aggressive appearing focal osseous lesions. Mild thoracic spondylosis.   IMPRESSION: 1. Spectrum of findings c 07/05/2023 Discussed the use of AI scribe software for clinical note transcription with the patient, who gave verbal consent to proceed.  Patient was given a provisional diagnosis of IPF based on age, disease prodominantly in lower lobes (probably UIP) and normal serology. She was started on pirfenidone  in October. Liver function was slightly elevated in December, medication was stopped per Dr. Bertrum Brodie and restarted at lower dose. The patient reported experiencing dizziness and feeling stoned while on the medication, to the point of being unable to drive. Patient notified our office on 06/14/2023 with reports of bronchitis symptoms with increased shortness of breath. She was admitted for IPF flare vs PNA. She was discharged on Augmentin  which she has completed and has 1 day left of prednisone  as well at home oxygen  at 2L. Respiratory-wise, the patient denies any current breathing difficulties or shortness of breath. They reported a very minimal cough with occasional clear mucus production, and no wheezing or chest tightness. They also mentioned having acid reflux, which is exacerbated by certain foods.  The patient also has a bladder problem, for which they take Toviaz . They reported that they have been waking up to urinate and have been experiencing urinary urgency. They also mentioned that they have been having issues with  emptying their bladder completely. The patient was on a daily antibiotic, Keflex , for urinary tract infection prevention, but it was stopped during their hospital stay due to a potential interaction with the Pirfenidone .    uidelines: Diagnosis of Idiopathic Pulmonary Fibrosis: An Official ATS/ERS/JRS/ALAT Clinical Practice Guideline. Am Annie Barton Crit Care Med Vol 198, Iss 5, [phone], Feb 27 2017. 2. One vessel coronary atherosclerosis. 3.  Aortic Atherosclerosis (ICD10-I70.0).     Electronically Signed   By: Levell Reach M.D.   On: 02/04/2023 14:22    Latest Reference Range & Units 01/28/07 09:21 03/26/08 11:40 06/19/09 07:31 12/20/09 10:02 03/09/10 09:13 07/07/10 07:51 07/31/11 08:24 08/05/12 09:12 08/04/13 14:09 04/03/14 11:14 08/06/14 09:03 11/16/14 11:26 08/09/15 09:09 08/12/16 08:18 07/04/18 15:54 08/22/18 09:40 11/30/18 12:26 12/15/19 16:07 02/13/20 10:08 04/05/20 10:20 07/26/20 16:11 02/07/21 11:20 09/16/21 13:27 11/18/21 08:37 03/03/22 08:49 08/07/22 13:30  Eosinophils Absolute 0.0 - 0.7 K/uL 0.4 0.2 0.3 0.2 0.3 0.2 0.2 0.7 0.5 0.2 0.4 0.3 0.2 0.3 0.2 0.2 0.1 0.2 214 0.3 0.2 0.2 0.1 0.3 0.1 0.2     Latest Reference Range & Units 01/21/23 09:50  Immature Granulocytes Not Estab. % 0  Immature Grans (Abs) 0.0 - 0.1 x10E3/uL 0.0  IgE (Immunoglobulin E), Serum 6 - 495 IU/mL 1,646 (H)  ANA Titer 1  Negative  dsDNA Ab 0 - 9 IU/mL <1  ENA RNP Ab 0.0 - 0.9 AI 0.3  ENA SSA (RO) Ab 0.0 - 0.9 AI <0.2  ENA SSB (LA) Ab 0.0 - 0.9 AI <0.2  RA Latex Turbid. <14.0 IU/mL 15.5 (H)  ENA SM Ab Ser-aCnc 0.0 - 0.9 AI <0.2  Scleroderma (Scl-70) (ENA)  Antibody, IgG 0.0 - 0.9 AI <0.2  (H): Data is abnormally high PFT   OV 09/13/2023  Subjective:  Patient ID: Amy Escobar, female , DOB: April 18, 1946 , age 10 y.o. , MRN: [id number] , ADDRESS: [name] [place] [address] PCP Adra Alanis, FNP Patient Care Team: Adra Alanis, FNP as PCP -  General (Internal Medicine) Sonny Dust, MD (Inactive) as PCP - Cardiology (Cardiology) Thomos Flies, MD (Internal Medicine) Ivin Marrow, PA-C as Physician Assistant (Obstetrics and Gynecology) Arty Binning, MD (Inactive) as Consulting Physician (Cardiology) Claudette Cue, MD (Inactive) as Consulting Physician (Gastroenterology) Seward Dao Alleen Arbour, MD as Consulting Physician (Ophthalmology) Diamond Formica, MD as Consulting Physician (Pulmonary Disease) Isidro Margo, DO as Attending Physician (Family Medicine) Ruffin Cotton, DPM as Consulting Physician (Podiatry)  This Provider for this visit: Treatment Team:  Attending Provider: Maire Scot, MD    09/13/2023 -   Chief Complaint  Patient presents with   Follow-up    Breathing has overall been doing well. She is using her o2 mainly just at night. She has a prod cough with white sputum.    5.  HPI Amy Escobar 78 y.o. -returns for follow-up with her daughter.  At this visit she says she is on low-dose pirfenidone .  This because the high dose gave a lot of side effects.  She is tolerating low-dose fine without any problems.  She feels her shortness of breath is also stable.  Her main issues   - cough.  It is severe.  Daughter feels it is allergy  related.  She did see the allergist she did not remember much details.  I went and reviewed the note.  Informed her that the allergist is considering allergy  shots.  And advised that she needs to follow-up.  Initially considered Xolair but then based on allergy  notes have recommended that she revisit with Dr. Tempie Fee  -Wanted no follow-up of CT scan: The CT scan in the hospital in December 2024 ruled out PE but showed pulmonary infiltrates.  She needs a repeat follow-up CT scan of the chest which we will get in couple of months I have personally visualized the CT scan from December 2024.  Also reviewed external records of the hospital.  -Other treatments: I told her  pirfenidone  and nintedanib are the only treatment modulating drugs.  She can try other over-the-counter or natural herbs but we do not know if this worked.  Recommended she consider clinical trial participation.  -Disease monitoring: Last pulmonary function test summer 2024.  She needs another PFT and CT scan.  Exercise hypoxemia test shows a tendency to desaturate but her oxygenation is adequate.   OV 12/10/2023  Subjective:  Patient ID: Amy Escobar, female , DOB: 11-Feb-1946 , age 29 y.o. , MRN: [id number] , ADDRESS: [address] [place] [address] PCP Adra Alanis, FNP Patient Care Team: Adra Alanis, FNP as PCP - General (Internal Medicine) Sonny Dust, MD (Inactive) as PCP - Cardiology (Cardiology) Thomos Flies, MD (Internal Medicine) Ivin Marrow, PA-C as Physician Assistant (Obstetrics and Gynecology) Arty Binning, MD (Inactive) as Consulting Physician (Cardiology) Claudette Cue, MD (Inactive) as Consulting Physician (Gastroenterology) Seward Dao Alleen Arbour, MD as Consulting Physician (Ophthalmology) Diamond Formica, MD as Consulting Physician (Pulmonary Disease) Isidro Margo, DO as Attending Physician (Family Medicine) Ruffin Cotton, DPM as Consulting Physician (Podiatry) Clarnce Crow, RN as Registered Nurse  This Provider for this visit: Treatment Team:  Attending Provider: Maire Scot, MD    12/10/2023 -   Chief Complaint  Patient presents with   Follow-up    Follow up from  PFT , discuss change her oxygen  and discuss her medication, still coughing     #IPF diagnosed given October 2024 and started pirfenidone  ( -Giving a provisional diagnosis of idiopathic pulmonary fibrosis [IPF].  This is based on age greater than 24, disease predominantly in the lower lobe, probable UIP description on the CT scan, progression since 2020 [he had normal pulmonary function test in 2015], and near normal serology  blood work  -As of March 2025 on low-dose pirfenidone  protocol  -Trace positive rheumatoid factor in July 2024  # Significant elevation in IgE  -2300in  2021 and 1600 in July 2024  -Positive for dog dander, [place] grass cockroach ragweed dust mite and different trees\  -Seen by Dr. Tempie Fee allergist and fall 2024.  #Hospitalization for pneumonia respiratory failure in December 2024  #Esbriet /Pirfenidone  requires intensive drug monitoring due to high concerns for Adverse effects of , including  Drug Induced Liver Injury, significant GI side effects that include but not limited to Diarrhea, Nausea, Vomiting,  and other system side effects that include Fatigue, headaches, weight loss and other side effects such as skin rash. These will be monitored with  blood work such as LFT initially once a month for 6 months and then quarterly   - Intolerance to higher dose pirfenidone   -Low-dose pirfenidone  since late 2024/early 2025 HPI Amy Escobar 78 y.o. -returns for follow-up.  She feels she is doing well.  No hospitalizations no ER visits no urgent care visits no surgeries.  She started taking [place] extract for cough and after this this has helped.  She is wondering if she should take it with tea or honey but I told her to reach out to commercial news and social contacts.  She is currently on pirfenidone  and is able to tolerate the low-dose protocol.  She is unable to tolerate the higher dose protocol.  She had pulmonary function test today and shows a decline.  She had high-resolution CT chest is also shows a decline.  Personally visualized it and showed it to her.  I told her I was very concerned about the decline in the long-term implications of this.  Subjectively she is not feeling any different with objective data shows decline.   We discussed options - Discussed switching over to nintedanib but because of the reported half percent MI risk she does not want to do this. - Discussed upcoming  Nerandomilast potential approval end of the year 2025/early 2026: She will entertain this if the drug is approved and when it is approved - In the interim we discussed clinical trial with IV infusion trial [a few upcoming at our location] and she is interested in this - We also discussed participation in the IPF-pro registry and she is interested - She wanted a portable oxygen  concentrator and be qualified her for this [current tank is a little too heavy]   SYMPTOM SCALE - ILD 04/20/2023 Recoemed esbreit after this visit 09/13/2023 Low dose esbiret 12/10/2023 :pw dose esbriet  protocl  + portable o2  Current weight     O2 use ra ra ra  Shortness of Breath 0 -> 5 scale with 5 being worst (score 6 If unable to do)    At rest 0 0 0  Simple tasks - showers, clothes change, eating, shaving  0 2 1  Household (dishes, doing bed, laundry) 2 3 4   Shopping 3 4 1   Walking level at own pace 3 3 3.5  Walking up Stairs 5 0 1  Total (30-36) Dyspnea Score 18 11 10/5  How bad is your cough? 2 5 2  - better with [place] in tea  How bad is your fatigue 3 4 4   How bad is nausea 0 0 2  How bad is vomiting?  0 0 0  How bad is diarrhea? 0 0 0  How bad is anxiety? 0 2 0  How bad is depression 0 2 0  Any chronic pain - if so where and how bad 0 0 0   Simple office walk 224 (66+46 x 2) feet Pod A at [facility] x  3 laps goal with forehead probe 09/13/2023  12/10/2023   O2 used ra ra  Number laps completed 3 [place] 3 laps but stopped at 2  Comments about pace Mod pac Modated pace  Resting Pulse Ox/HR 100% and 90/min 98% and HR 88  Final Pulse Ox/HR 93% and 102/min 88% at 2 laps  HR 89  Desaturated </= 88% no yes  Desaturated <= 3% points yes yes  Got Tachycardic >/= 90/min yes no  Symptoms at end of test No dyspnea DYSPNEIC +  Miscellaneous comments x Corrected 2L [place] statyed at 97%    PFT     Latest Ref Rng & Units 11/24/2023    2:48 PM 01/25/2023    8:33 AM 07/08/2020   10:05 AM 09/12/2013   11:57 AM   PFT Results  FVC-Pre L 1.70  P 1.96  1.75  2.53   FVC-Predicted Pre % 62  P 71  78  103   FVC-Post L  2.05  1.54  2.42   FVC-Predicted Post %  74  68  99   Pre FEV1/FVC % % 86  P 81  75  79   Post FEV1/FCV % %  85  97  83   FEV1-Pre L 1.45  P 1.59  1.31  2.00   FEV1-Predicted Pre % 72  P 77  75  106   FEV1-Post L  1.74  1.49  2.01   DLCO uncorrected ml/min/mmHg 7.98  P 10.18  21.45  17.08   DLCO UNC% % 42  P 53  111  70   DLCO corrected ml/min/mmHg 7.98  P 10.18  21.45    DLCO COR %Predicted % 42  P 53  111    DLVA Predicted % 74  P 76  85  94   TLC L  4.32  2.94  4.12   TLC % Predicted %  85  58  81   RV % Predicted %  116  55  76     P Preliminary result     arrative & Impression  CLINICAL DATA:  Diffuse/interstitial lung disease   EXAM: CT CHEST WITHOUT CONTRAST   TECHNIQUE: Multidetector CT imaging of the chest was performed following the standard protocol without intravenous contrast. High resolution imaging of the lungs, as well as inspiratory and expiratory imaging, was performed.   RADIATION DOSE REDUCTION: This exam was performed according to the departmental dose-optimization program which includes automated exposure control, adjustment of the mA and/or kV according to patient size and/or use of iterative reconstruction technique.   COMPARISON:  06/15/2023 chest CT angiogram. 07/05/2023 chest radiograph. 01/29/2023 high-resolution chest CT.   FINDINGS: Cardiovascular: Normal heart size. No  significant pericardial effusion/thickening. Left anterior descending coronary atherosclerosis. Atherosclerotic nonaneurysmal thoracic aorta. Normal caliber pulmonary arteries.   Mediastinum/Nodes: No significant thyroid  nodules. Unremarkable esophagus. No pathologically enlarged axillary, mediastinal or hilar lymph nodes, noting limited sensitivity for the detection of hilar adenopathy on this noncontrast study.   Lungs/Pleura: No pneumothorax. No pleural effusion.  No acute consolidative airspace disease, lung masses or significant pulmonary nodules. No significant lobular air trapping or evidence of tracheobronchomalacia on the expiration sequence. Moderate patchy confluent subpleural reticulation with associated mild-to-moderate traction bronchiectasis, architectural distortion and volume loss. There is a mild basilar predominance to these findings. Small focus of honeycombing in the inferior lingula. There is clear interval progression since 01/29/2023 CT.   Upper abdomen: No acute abnormality.   Musculoskeletal: No aggressive appearing focal osseous lesions. Moderate thoracic spondylosis.   IMPRESSION: 1. Spectrum of findings compatible with basilar predominant fibrotic interstitial lung disease with mild honeycombing, with clear interval progression since 01/29/2023 CT. Findings are consistent with UIP per consensus guidelines: Diagnosis of Idiopathic Pulmonary Fibrosis: An Official ATS/ERS/JRS/ALAT Clinical Practice Guideline. Am Annie Barton Crit Care Med Vol 198, Iss 5, [phone], Feb 27 2017. 2. One vessel coronary atherosclerosis. 3.  Aortic Atherosclerosis (ICD10-I70.0).     Electronically Signed   By: Levell Reach M.D.   On: 11/02/2023 09:45      LAB RESULTS last 96 hours No results found.       has a past medical history of Alkaline phosphatase deficiency, Allergic rhinitis, Allergy , Anxiety, Arthritis, Cataract, DM2 (diabetes mellitus, type 2) (HCC), GERD (gastroesophageal reflux disease), Gout, Hemorrhoids, Hyperlipidemia, Hypertension, Interstitial lung disease (HCC), Mild pulmonary hypertension (HCC), NSVT (nonsustained ventricular tachycardia) (HCC), Osteoporosis, PVC's (premature ventricular contractions), Thyroid  nodule, Tubular adenoma of colon (2017), Urticaria, and Uterine fibroid.   reports that she quit smoking about 55 years ago. Her smoking use included cigarettes. She started smoking about 60 years ago. She has a  1.3 pack-year smoking history. She has never used smokeless tobacco.  Past Surgical History:  Procedure Laterality Date   CATARACT EXTRACTION Bilateral 2016   COLONOSCOPY  2007   echocardiogram (other)  01/16/2002   POLYPECTOMY     removed tumors from foot nerves  04/1999   stress cardiolite   02/12/2006   TOENAIL EXCISION     TUBAL LIGATION     TYMPANOSTOMY TUBE PLACEMENT      Allergies  Allergen Reactions   Benicar [Olmesartan] Other (See Comments)    Headache     Immunization History  Administered Date(s) Administered   Fluad Quad(high Dose 65+) 04/18/2019   Influenza Split 03/23/2011, 04/07/2013   Influenza Whole 04/13/2008, 04/29/2009, 03/29/2010, 03/29/2018   Influenza, High Dose Seasonal PF 03/11/2023   Influenza-Unspecified 04/08/2012, 03/28/2014, 03/14/2015, 03/13/2016, 04/15/2020   PFIZER(Purple Top)SARS-COV-2 Vaccination 08/13/2019, 09/05/2019, 04/15/2020   PPD Test 03/23/2011   Pfizer Covid-19 Vaccine Bivalent Booster 54yrs & up 04/29/2021   Pneumococcal Conjugate-13 01/10/2014   Pneumococcal Polysaccharide-23 07/07/2010, 03/31/2018   Td 12/27/2001   Tdap 08/06/2014   Zoster Recombinant(Shingrix) 03/31/2018    Family History  Problem Relation Age of Onset   Heart disease Mother    Allergic rhinitis Mother    Heart disease Father    Stomach cancer Maternal Grandmother    Heart disease Maternal Grandfather    Rectal cancer Maternal Grandfather    Colon cancer Neg Hx    Breast cancer Neg Hx    Colon polyps Neg Hx    Esophageal cancer Neg Hx      Current Outpatient  Medications:    aspirin  EC 81 MG tablet, Take 81 mg by mouth daily., Disp: , Rfl:    azelastine  (ASTELIN ) 0.1 % nasal spray, Place 2 sprays into both nostrils 2 (two) times daily., Disp: 30 mL, Rfl: 5   benzonatate  (TESSALON ) 200 MG capsule, Take 1 capsule (200 mg total) by mouth 3 (three) times daily as needed for cough., Disp: 30 capsule, Rfl: 1   Blood Glucose Monitoring Suppl (ONETOUCH  VERIO FLEX SYSTEM) w/Device KIT, USE TO TEST BLOOD SUGAR DAILY AS DIRECTED, Disp: 1 kit, Rfl: 0   Calcium  Carb-Cholecalciferol (CALCIUM  + VITAMIN D3 PO), Take 1 tablet by mouth daily., Disp: , Rfl:    celecoxib  (CELEBREX ) 200 MG capsule, TAKE 1 CAPSULE BY MOUTH DAILY, Disp: 30 capsule, Rfl: 1   cetirizine  (ZYRTEC ) 10 MG tablet, Take 0.5 tablets (5 mg total) by mouth daily., Disp: 30 tablet, Rfl: 5   esomeprazole  (NEXIUM ) 40 MG capsule, Take 1 capsule (40 mg total) by mouth 2 (two) times daily before a meal., Disp: 180 capsule, Rfl: 1   fesoterodine  (TOVIAZ ) 4 MG TB24 tablet, Take 4 mg by mouth daily., Disp: , Rfl:    fluticasone  (FLONASE ) 50 MCG/ACT nasal spray, Place 2 sprays into both nostrils daily as needed for allergies or rhinitis., Disp: 16 g, Rfl: 5   gabapentin  (NEURONTIN ) 100 MG capsule, Take 1 capsule (100 mg total) by mouth at bedtime., Disp: 90 capsule, Rfl: 3   gabapentin  (NEURONTIN ) 300 MG capsule, Take 300 mg by mouth daily as needed (neuropathy)., Disp: , Rfl:    glucose blood (ONETOUCH VERIO) test strip, USE TO CHECK BLOOD SUGAR TWO TIMES A DAY, Disp: 100 strip, Rfl: 3   lidocaine  (LIDODERM ) 5 %, 1 patch daily., Disp: , Rfl:    Misc Natural Products (MULLEIN GARLIC EAR DROPS OT), Place in ear(s)., Disp: , Rfl:    Multiple Vitamins-Minerals (ZINC PO), Take 1 tablet by mouth daily., Disp: , Rfl:    Pirfenidone  267 MG TABS, Take 2 tablets (534 mg total) by mouth in the morning, at noon, and at bedtime. **low dose as maintenance**, Disp: 540 tablet, Rfl: 0   rosuvastatin  (CRESTOR ) 20 MG tablet, TAKE 1 TABLET BY MOUTH DAILY, Disp: 90 tablet, Rfl: 2   Semaglutide ,0.25 or 0.5MG /DOS, (OZEMPIC , 0.25 OR 0.5 MG/DOSE,) 2 MG/3ML SOPN, 0.5 mg by Other route once a week., Disp: 9 mL, Rfl: 1   valsartan  (DIOVAN ) 160 MG tablet, Take 1 tablet (160 mg total) by mouth daily., Disp: 90 tablet, Rfl: 2      Objective:   Vitals:   12/10/23 0917  BP: 124/70  Pulse: 85  SpO2: 98%  Weight: 155 lb  (70.3 kg)  Height: 5' 4 (1.626 m)    Estimated body mass index is 26.61 kg/m as calculated from the following:   Height as of this encounter: 5' 4 (1.626 m).   Weight as of this encounter: 155 lb (70.3 kg).  @WEIGHTCHANGE @  [employer]   12/10/23 0917  Weight: 155 lb (70.3 kg)     Physical Exam   General: No distress. Looks well O2 at rest: no Cane present: no Sitting in wheel chair: no Frail: no Obese: no Neuro: Alert and Oriented x 3. GCS 15. Speech normal Psych: Pleasant Resp:  Barrel Chest - no.  Wheeze - no, Crackles - MILD CRACKLEs, No overt respiratory distress CVS: Normal heart sounds. Murmurs - no Ext: Stigmata of Connective Tissue Disease - no HEENT: Normal upper airway. PEERL +. No post  nasal drip        Assessment:       ICD-10-CM   1. IPF (idiopathic pulmonary fibrosis) (HCC)  J84.112 Hepatic function panel    Ambulatory Referral for DME    2. Encounter for therapeutic drug monitoring  Z51.81 Hepatic function panel    Ambulatory Referral for DME         Plan:     Patient Instructions  ILD (interstitial lung disease) (HCC) IPF (idiopathic pulmonary fibrosis) (HCC)   -You had intolerance to high-dose Esbriet .  You are doing well with low-dose Esbriet  protocol -Course characterized by pneumonia admission in December 2024 - Disease is progressive on PFT and CT as of summer 2025 v summer 2025   Plan  -Continue low-dose  pirfenidone  per protocol  - Check LFT 12/10/2023 -  0- recommended change to OFEV but given MI risk respect you declining  -Clinical trial considerations in the future'   - Consider IPF-pro registry study - take ICF 12/10/2023  - COnsider IV trial in the fall 2025   - Do spirometry and DLCO in the next 3 months  - CMA to qualify your for POC   GERD  Plan  - Address at next visit including Fosamax  use  Seasonal allergies Elevated IgE level House dust mite allergy   -You did see Dr. Tempie Fee in November 2024.   She recommended that if the cough is not controlled that she would need to do skin allergy  testing and then consider allergy  shots  Plan   -Per Dr Verdon Glance   Follow-up -12 weeks-week visit with Dr. Bertrum Brodie o 30-minute to discuss  = Symptom score and exercise hypoxemia test at follow-up   FOLLOWUP Return in about 12 weeks (around 03/03/2024) for 30 min visit, after Spiro and DLCO, with Dr Bertrum Brodie, Face to Face Visit.    SIGNATURE    Dr. Maire Scot, M.D., F.C.C.P,  Pulmonary and Critical Care Medicine Staff Physician, [facility] Center Director - Interstitial Lung Disease  Program  Pulmonary Fibrosis [facility] at [facility] [place], [place], [address]  Pager: [phone], If no answer or between  15:00h - 7:00h: call [phone] Telephone: [phone]  6:10 PM 12/10/2023

## 2023-12-13 ENCOUNTER — Other Ambulatory Visit: Payer: Self-pay

## 2023-12-15 ENCOUNTER — Other Ambulatory Visit: Payer: Self-pay

## 2023-12-17 ENCOUNTER — Other Ambulatory Visit: Payer: Self-pay | Admitting: Pharmacy Technician

## 2023-12-17 ENCOUNTER — Other Ambulatory Visit: Payer: Self-pay

## 2023-12-17 NOTE — Progress Notes (Signed)
 Specialty Pharmacy Refill Coordination Note  Amy Escobar is a 78 y.o. female contacted today regarding refills of specialty medication(s) Pirfenidone    Patient requested Delivery   Delivery date: 12/28/23   Verified address: [name] [place], [place] [address]   Medication will be filled on 12/27/23.

## 2023-12-23 ENCOUNTER — Ambulatory Visit (INDEPENDENT_AMBULATORY_CARE_PROVIDER_SITE_OTHER): Admitting: Allergy

## 2023-12-23 ENCOUNTER — Other Ambulatory Visit: Payer: Self-pay

## 2023-12-23 ENCOUNTER — Encounter: Payer: Self-pay | Admitting: Allergy

## 2023-12-23 VITALS — BP 126/64 | HR 92 | Temp 98.3°F | Wt 156.9 lb

## 2023-12-23 DIAGNOSIS — J3089 Other allergic rhinitis: Secondary | ICD-10-CM

## 2023-12-23 DIAGNOSIS — J302 Other seasonal allergic rhinitis: Secondary | ICD-10-CM

## 2023-12-23 DIAGNOSIS — J849 Interstitial pulmonary disease, unspecified: Secondary | ICD-10-CM

## 2023-12-23 DIAGNOSIS — H1013 Acute atopic conjunctivitis, bilateral: Secondary | ICD-10-CM

## 2023-12-23 MED ORDER — MONTELUKAST SODIUM 10 MG PO TABS: 10.0000 mg | ORAL_TABLET | Freq: Every day | ORAL | 5 refills | Status: DC

## 2023-12-23 NOTE — Patient Instructions (Addendum)
 Allergic Rhinitis with Conjunctivitis Allergy  testing was positive in 2020 to dust mites, molds, cockroach, dog dander, tree pollen, grass pollen, weed pollen.   Continue avoidance measures.   -Resume Montelukast  10 mg daily at bedtime.  -Continue Xyzal  5 mg daily (take whole tab daily) -Use Astelin  nasal spray 2 sprays up to 2 times a day for nasal drainage/post-nasal drip. -Use Flonase  2 sprays each nostril daily for 1-2 weeks at a time before stopping once nasal congestion improves for maximum benefit. With using nasal sprays point tip of bottle toward eye on same side nostril and lean head slightly forward for best technique.   -Continue Pataday  eye drops 1 drop each eye daily as needed for itchy watery eyes.  Interstitial Lung Disease Under the care of Dr. Bubba. On low-dose Pirfenidone  (Esbriet ). -Continue follow-up with Dr. Bubba for management of interstitial lung disease.  Follow-up in 3 months to assess the effectiveness of the new allergy  regimen.

## 2023-12-23 NOTE — Patient Outreach (Signed)
 Complex Care Management   Visit Note  12/23/2023  Name:  Amy Escobar MRN: [id number] DOB: 1946-03-28  Situation: Referral received for Complex Care Management related to Diabetes with Complications and IPF I obtained verbal consent from Patient.  Visit completed with patient  on the phone.  Patient was driving to pick up medications, so agreed to keep the visit brief for safety.  Background:   Past Medical History:  Diagnosis Date   Alkaline phosphatase deficiency    w/u Ne   Allergic rhinitis    Allergy     Anxiety    Arthritis    Cataract    BILATERAL-REMOVED   DM2 (diabetes mellitus, type 2) (HCC)    GERD (gastroesophageal reflux disease)    Gout    Hemorrhoids    Hyperlipidemia    Hypertension    Interstitial lung disease (HCC)    Mild pulmonary hypertension (HCC)    NSVT (nonsustained ventricular tachycardia) (HCC)    Osteoporosis    PVC's (premature ventricular contractions)    Thyroid  nodule    small   Tubular adenoma of colon 2017   Urticaria    Uterine fibroid     Assessment: Patient Reported Symptoms:  Cognitive Cognitive Status: Alert and oriented to person, place, and time      Neurological Neurological Review of Symptoms: No symptoms reported    HEENT HEENT Symptoms Reported: No symptoms reported      Cardiovascular Cardiovascular Symptoms Reported: No symptoms reported Does patient have uncontrolled Hypertension?: Yes Is patient checking Blood Pressure at home?: No Cardiovascular Conditions: Coronary artery disease, High blood cholesterol, Hypertension Cardiovascular Management Strategies: Medication therapy, Medical device, Routine screening  Respiratory Respiratory Symptoms Reported: Productive cough Other Respiratory Symptoms: patient reports improvement since last assessment, new medication ordered by allergy  provider, pulmon ordered POC via [employer]. Respiratory Conditions: Shortness of breath (IPF) Respiratory Comment: Pulmon ordered POC  from [employer], patient will call vendor next week if no contact  Endocrine Patient reports the following symptoms related to hypoglycemia or hyperglycemia : No symptoms reported Is patient diabetic?: Yes Is patient checking blood sugars at home?: Yes Endocrine Conditions: Diabetes Endocrine Management Strategies: Medication therapy, Routine screening, Diet modification, Exercise Endocrine Self-Management Outcome: 4 (good)  Gastrointestinal Gastrointestinal Symptoms Reported: No symptoms reported      Genitourinary Genitourinary Symptoms Reported: No symptoms reported    Integumentary Integumentary Symptoms Reported: No symptoms reported    Musculoskeletal Musculoskelatal Symptoms Reviewed: Difficulty walking Additional Musculoskeletal Details: patient recently started exercise program at senior center and feels it is helping her with her mobility and energy level Musculoskeletal Conditions: Osteoarthritis Musculoskeletal Management Strategies: Routine screening, Exercise, Medication therapy Falls in the past year?: No    Psychosocial Psychosocial Symptoms Reported: No symptoms reported Additional Psychological Details: patient reports taking a trip with her church, begining to find more social activities, has been getting additional help from family members            12/28/2022    9:08 AM  Depression screen PHQ 2/9  Decreased Interest 0  Down, Depressed, Hopeless 0  PHQ - 2 Score 0    There were no vitals filed for this visit.  Medications Reviewed Today     Reviewed by Lonzell Planas, RN (Registered Nurse) on 12/23/23 at 1514  Med List Status: <None>   Medication Order Taking? Sig Documenting Provider Last Dose Status Informant  aspirin  EC 81 MG tablet [id number]  Take 81 mg by mouth daily. [provider]  Active Self, Pharmacy  Records  azelastine  (ASTELIN ) 0.1 % nasal spray [id number]  Place 2 sprays into both nostrils 2 (two) times daily. Cari Arlean HERO, FNP  Active    benzonatate  (TESSALON ) 200 MG capsule [id number]  Take 1 capsule (200 mg total) by mouth 3 (three) times daily as needed for cough. Kara Dorn NOVAK, MD  Active   Blood Glucose Monitoring Suppl [facility] VERIO FLEX SYSTEM) w/Device PRESSLEY [id number]  USE TO TEST BLOOD SUGAR DAILY AS DIRECTED Shamleffer, Donell Cardinal, MD  Active Self, Pharmacy Records  Calcium  Carb-Cholecalciferol (CALCIUM  + VITAMIN D3 PO) [id number]  Take 1 tablet by mouth daily. [provider]  Active Self, Pharmacy Records  celecoxib  (CELEBREX ) 200 MG capsule [id number]  TAKE 1 CAPSULE BY MOUTH DAILY  Patient not taking: Reported on 12/23/2023   Gretta Bertrum ORN, PA-C  Active   cetirizine  (ZYRTEC ) 10 MG tablet [id number]  Take 0.5 tablets (5 mg total) by mouth daily. Cari Arlean HERO, FNP  Active   esomeprazole  (NEXIUM ) 40 MG capsule [id number]  Take 1 capsule (40 mg total) by mouth 2 (two) times daily before a meal. Jason Leita Repine, FNP  Active   fesoterodine  (TOVIAZ ) 4 MG TB24 tablet [id number]  Take 4 mg by mouth daily. [provider]  Active Self, Pharmacy Records  fluticasone  (FLONASE ) 50 MCG/ACT nasal spray [id number]  Place 2 sprays into both nostrils daily as needed for allergies or rhinitis. Cari Arlean HERO, FNP  Active   gabapentin  (NEURONTIN ) 100 MG capsule [id number]  Take 1 capsule (100 mg total) by mouth at bedtime. Shamleffer, Donell Cardinal, MD  Active Self, Pharmacy Records           Med Note (COFFELL, JON HERO   Wed Jun 16, 2023 11:41 AM) Pt states she uses 2 strengths of gabapentin .  gabapentin  (NEURONTIN ) 300 MG capsule [id number]  Take 300 mg by mouth daily as needed (neuropathy). [provider]  Active Self, Pharmacy Records  glucose blood [facility] VERIO) test strip [id number]  USE TO CHECK BLOOD SUGAR TWO TIMES A DAY Shamleffer, Donell Cardinal, MD  Active   lidocaine  (LIDODERM ) 5 % [id number]  1 patch daily. [provider]  Active   Misc Natural Products (MULLEIN GARLIC EAR  DROPS OT) [id number]  Place in ear(s). [provider]  Active   montelukast  (SINGULAIR ) 10 MG tablet [id number]  Take 1 tablet (10 mg total) by mouth at bedtime. Jeneal Danita Macintosh, MD  Active   Multiple Vitamins-Minerals (ZINC PO) [id number]  Take 1 tablet by mouth daily. [provider]  Active Self, Pharmacy Records  Pirfenidone  267 MG TABS [id number]  Take 2 tablets (534 mg total) by mouth in the morning, at noon, and at bedtime. **low dose as maintenanceTHORA Hope Almarie ORN, NP  Active   rosuvastatin  (CRESTOR ) 20 MG tablet [id number]  TAKE 1 TABLET BY MOUTH DAILY Walker, Caitlin S, NP  Active   Semaglutide ,0.25 or 0.5MG /DOS, (OZEMPIC , 0.25 OR 0.5 MG/DOSE,) 2 MG/3ML SOPN [id number]  0.5 mg by Other route once a week. Shamleffer, Ibtehal Jaralla, MD  Active   valsartan  (DIOVAN ) 160 MG tablet [id number]  Take 1 tablet (160 mg total) by mouth daily. Vannie Reche RAMAN, NP  Active             Recommendation:   Continue Current Plan of Care  Follow Up Plan:   Telephone follow up appointment date/time:  01/10/24 at 3:00   Olam Idol BSN RN CCM [place]  [facility], [facility] RN Care Manager  Direct Dial: [phone] Fax: [phone]

## 2023-12-23 NOTE — Progress Notes (Signed)
 Follow-up Note  RE: Amy Escobar MRN: [id number] DOB: September 09, 1945 Date of Office Visit: 12/23/2023   History of present illness: Amy Escobar is a 78 y.o. female presenting today for follow-up of allergic rhinitis with conjunctivitis and history of ILD followed by pulmonology.  She was last seen in the office on 09/23/23 by our nurse practitioner Ambs.   Discussed the use of AI scribe software for clinical note transcription with the patient, who gave verbal consent to proceed.  She has a history of allergies identified in 2020, including dust mites, mold, cockroach, dog dander, tree pollen, grass pollen, and weed pollen.  This year, she has not noticed an increase in sinus allergy  symptoms such as runny nose, stuffy nose mainly.  She does also experience itchy, watery eyes, sneezing.  Her current medication regimen includes Xyzal , half a pill daily in the morning, azelastine  nasal spray once a day for drainage, and Pataday  eye drops once a day for itchy eyes. She previously used montelukast  but stopped and is not sure why and states she stopped receiving it from the pharmacy.  She is  She experiences a persistent cough, which was previously attributed to her history of ILD.  However her daughter is present by phone also feels like it may have an allergy  component.  She coughs up clear mucus, sometimes thick, but has no fever or chills. She uses oxygen  as needed, particularly after exertion, and plans to take it with her on an upcoming trip.  She has experienced skin reactions to certain sunscreens, specifically one designed for black skin, which caused a breakout. She has since switched back to using Neutrogena sunscreen, which she tolerates well.  She mentions that chewing gum previously caused her to experience gas pains, which she managed with club soda. She has since stopped chewing gum.      Review of systems: 10pt ROS negative unless noted in HPI  Past  medical/social/surgical/family history have been reviewed and are unchanged unless specifically indicated below.  No changes  Medication List: Current Outpatient Medications  Medication Sig Dispense Refill   aspirin  EC 81 MG tablet Take 81 mg by mouth daily.     azelastine  (ASTELIN ) 0.1 % nasal spray Place 2 sprays into both nostrils 2 (two) times daily. 30 mL 5   benzonatate  (TESSALON ) 200 MG capsule Take 1 capsule (200 mg total) by mouth 3 (three) times daily as needed for cough. 30 capsule 1   Blood Glucose Monitoring Suppl (ONETOUCH VERIO FLEX SYSTEM) w/Device KIT USE TO TEST BLOOD SUGAR DAILY AS DIRECTED 1 kit 0   Calcium  Carb-Cholecalciferol (CALCIUM  + VITAMIN D3 PO) Take 1 tablet by mouth daily.     cetirizine  (ZYRTEC ) 10 MG tablet Take 0.5 tablets (5 mg total) by mouth daily. 30 tablet 5   esomeprazole  (NEXIUM ) 40 MG capsule Take 1 capsule (40 mg total) by mouth 2 (two) times daily before a meal. 180 capsule 1   fesoterodine  (TOVIAZ ) 4 MG TB24 tablet Take 4 mg by mouth daily.     fluticasone  (FLONASE ) 50 MCG/ACT nasal spray Place 2 sprays into both nostrils daily as needed for allergies or rhinitis. 16 g 5   gabapentin  (NEURONTIN ) 100 MG capsule Take 1 capsule (100 mg total) by mouth at bedtime. 90 capsule 3   gabapentin  (NEURONTIN ) 300 MG capsule Take 300 mg by mouth daily as needed (neuropathy).     glucose blood (ONETOUCH VERIO) test strip USE TO CHECK BLOOD SUGAR TWO TIMES A DAY  100 strip 3   lidocaine  (LIDODERM ) 5 % 1 patch daily.     Misc Natural Products (MULLEIN GARLIC EAR DROPS OT) Place in ear(s).     montelukast  (SINGULAIR ) 10 MG tablet Take 1 tablet (10 mg total) by mouth at bedtime. 30 tablet 5   Multiple Vitamins-Minerals (ZINC PO) Take 1 tablet by mouth daily.     Pirfenidone  267 MG TABS Take 2 tablets (534 mg total) by mouth in the morning, at noon, and at bedtime. **low dose as maintenance** 540 tablet 0   rosuvastatin  (CRESTOR ) 20 MG tablet TAKE 1 TABLET BY MOUTH  DAILY 90 tablet 2   Semaglutide ,0.25 or 0.5MG /DOS, (OZEMPIC , 0.25 OR 0.5 MG/DOSE,) 2 MG/3ML SOPN 0.5 mg by Other route once a week. 9 mL 1   valsartan  (DIOVAN ) 160 MG tablet Take 1 tablet (160 mg total) by mouth daily. 90 tablet 2   celecoxib  (CELEBREX ) 200 MG capsule TAKE 1 CAPSULE BY MOUTH DAILY (Patient not taking: Reported on 12/23/2023) 30 capsule 1   No current facility-administered medications for this visit.     Known medication allergies: Allergies  Allergen Reactions   Benicar [Olmesartan] Other (See Comments)    Headache      Physical examination: Blood pressure 126/64, pulse 92, temperature 98.3 F (36.8 C), weight 156 lb 14.4 oz (71.2 kg), SpO2 96%.  General: Alert, interactive, in no acute distress. HEENT: PERRLA, TMs pearly gray, turbinates non-edematous without discharge, post-pharynx non erythematous. Neck: Supple without lymphadenopathy. Lungs: Mildly decreased breath sounds bilaterally without wheezing, rhonchi or rales. {no increased work of breathing. CV: Normal S1, S2 without murmurs. Abdomen: Nondistended, nontender. Skin: Warm and dry, without lesions or rashes. Extremities:  No clubbing, cyanosis or edema. Neuro:   Grossly intact.  Diagnostics/Labs: None today  Assessment and plan:   Allergic Rhinitis with Conjunctivitis Allergy  testing was positive in 2020 to dust mites, molds, cockroach, dog dander, tree pollen, grass pollen, weed pollen.   Continue avoidance measures.   -Resume Montelukast  10 mg daily at bedtime.  -Continue Xyzal  5 mg daily (take whole tab daily) -Use Astelin  nasal spray 2 sprays up to 2 times a day for nasal drainage/post-nasal drip. -Use Flonase  2 sprays each nostril daily for 1-2 weeks at a time before stopping once nasal congestion improves for maximum benefit. With using nasal sprays point tip of bottle toward eye on same side nostril and lean head slightly forward for best technique.   -Continue Pataday  eye drops 1 drop each  eye daily as needed for itchy watery eyes.  Interstitial Lung Disease Under the care of Dr. Bubba. On low-dose Pirfenidone  (Esbriet ). -Continue follow-up with Dr. Bubba for management of interstitial lung disease.  Follow-up in 3 months to assess the effectiveness of the new allergy  regimen.  I appreciate the opportunity to take part in [facility] care. Please do not hesitate to contact me with questions.  Sincerely,   Danita Brain, MD Allergy /Immunology [facility]

## 2023-12-23 NOTE — Patient Instructions (Signed)
 Visit Information  Thank you for taking time to visit with me today. Please don't hesitate to contact me if I can be of assistance to you before our next scheduled appointment.  Our next appointment is by telephone on 01/10/24 at 3:00 Please call the care guide team at [phone] if you need to cancel or reschedule your appointment.   Following is a copy of your care plan:   Goals Addressed             This Visit's Progress    VBCI RN Care Plan   On track    Problems:  Chronic Disease Management support and education needs related to DMII, HTN, and Pulmonary Disease  Goal: Over the next 30 days the Patient will continue to work with Medical illustrator and/or Social Worker to address care management and care coordination needs related to DMII, HTN, and Pulmonary Disease as evidenced by adherence to care management team scheduled appointments     demonstrate Ongoing adherence to prescribed treatment plan for DMII, HTN, and Pulmonary Disease as evidenced by taking all medications as prescribed, completing all ordered tests, attending all medical appointments take all medications exactly as prescribed and will call provider for medication related questions as evidenced by chart review and patient report     Interventions:   Diabetes Interventions: Assessed patient's understanding of A1c goal: <6.5% Provided education to patient about basic DM disease process Reviewed medications with patient and discussed importance of medication adherence Counseled on importance of regular laboratory monitoring as prescribed Discussed plans with patient for ongoing care management follow up and provided patient with direct contact information for care management team Review of patient status, including review of consultants reports, relevant laboratory and other test results, and medications completed Screening for signs and symptoms of depression related to chronic disease state  Assessed social  determinant of health barriers Lab Results  Component Value Date   HGBA1C 5.8 (A) 09/07/2023    Hypertension Interventions: Last practice recorded BP readings:  BP Readings from Last 3 Encounters:  12/02/23 124/89  11/24/23 125/83  11/16/23 122/66   Most recent eGFR/CrCl:  Lab Results  Component Value Date   EGFR 75 01/21/2023    No components found for: CRCL  Evaluation of current treatment plan related to hypertension self management and patient's adherence to plan as established by provider Provided education to patient re: stroke prevention, s/s of heart attack and stroke Reviewed medications with patient and discussed importance of compliance Discussed plans with patient for ongoing care management follow up and provided patient with direct contact information for care management team Advised patient, providing education and rationale, to monitor blood pressure daily and record, calling PCP for findings outside established parameters Reviewed scheduled/upcoming provider appointments including:  Discussed complications of poorly controlled blood pressure such as heart disease, stroke, circulatory complications, vision complications, kidney impairment, sexual dysfunction Screening for signs and symptoms of depression related to chronic disease state  Assessed social determinant of health barriers  Patient Self-Care Activities:  Attend all scheduled provider appointments Attend church or other social activities Call pharmacy for medication refills 3-7 days in advance of running out of medications Call provider office for new concerns or questions  Take medications as prescribed   Work with the social worker to address care coordination needs and will continue to work with the clinical team to address health care and disease management related needs  Plan:  Telephone follow up appointment with care management team member scheduled for:  01/10/24 at 3:00              Please call the Suicide and Crisis Lifeline: 988 call the [place]  National Suicide Prevention Lifeline: 2133420025 or TTY: 716-244-0309 TTY 682-550-6697) to talk to a trained counselor call 1-800-273-TALK (toll free, 24 hour hotline) if you are experiencing a Mental Health or Behavioral Health Crisis or need someone to talk to.  Patient verbalizes understanding of instructions and care plan provided today and agrees to view in MyChart. Active MyChart status and patient understanding of how to access instructions and care plan via MyChart confirmed with patient.      Olam Idol BSN RN CCM [place]  [facility], [facility] RN Care Manager Direct Dial: [phone] Fax: [phone]

## 2023-12-24 ENCOUNTER — Ambulatory Visit: Admitting: Allergy

## 2023-12-24 ENCOUNTER — Telehealth: Payer: Self-pay

## 2023-12-24 NOTE — Telephone Encounter (Signed)
 Copied from CRM [id number]. Topic: Clinical - Order For Equipment >> Dec 24, 2023  9:44 AM Benton O wrote: Reason for CRM: patient is calling about her order for new oxygen  patient is wanting to know has that order been sent in to [facility] . Patient was in to see dr geronimo on the 13th and he was sending a new order in for oxygen   [id number]

## 2023-12-27 ENCOUNTER — Other Ambulatory Visit: Payer: Self-pay

## 2023-12-29 ENCOUNTER — Ambulatory Visit

## 2023-12-29 VITALS — BP 126/64 | Ht 64.0 in | Wt 154.0 lb

## 2023-12-29 DIAGNOSIS — Z0000 Encounter for general adult medical examination without abnormal findings: Secondary | ICD-10-CM | POA: Diagnosis not present

## 2023-12-29 NOTE — Progress Notes (Signed)
 Because this visit was a virtual/telehealth visit,  certain criteria was not obtained, such a blood pressure, CBG if applicable, and timed get up and go. Any medications not marked as taking were not mentioned during the medication reconciliation part of the visit. Any vitals not documented were not able to be obtained due to this being a telehealth visit or patient was unable to self-report a recent blood pressure reading due to a lack of equipment at home via telehealth. Vitals that have been documented are verbally provided by the patient.  This visit was performed by a medical professional under my direct supervision. I was immediately available for consultation/collaboration. I have reviewed and agree with the Annual Wellness Visit documentation.  Subjective:   Amy Escobar is a 78 y.o. who presents for a Medicare Wellness preventive visit.  As a reminder, Annual Wellness Visits don't include a physical exam, and some assessments may be limited, especially if this visit is performed virtually. We may recommend an in-person follow-up visit with your provider if needed.  Visit Complete: Virtual I connected with  Amy Escobar on 12/29/23 by a audio enabled telemedicine application and verified that I am speaking with the correct person using two identifiers.  Patient Location: Home  Provider Location: Home Office  I discussed the limitations of evaluation and management by telemedicine. The patient expressed understanding and agreed to proceed.  Vital Signs: Because this visit was a virtual/telehealth visit, some criteria may be missing or patient reported. Any vitals not documented were not able to be obtained and vitals that have been documented are patient reported.  VideoDeclined- This patient declined Librarian, academic. Therefore the visit was completed with audio only.  Persons Participating in Visit: Patient.  AWV Questionnaire: No: Patient  Medicare AWV questionnaire was not completed prior to this visit.  Cardiac Risk Factors include: advanced age (>61men, >67 women);hypertension;diabetes mellitus;sedentary lifestyle     Objective:    Today's Vitals   12/29/23 0858 12/29/23 0859  BP: 126/64   Weight: 154 lb (69.9 kg)   Height: 5' 4 (1.626 m)   PainSc:  0-No pain   Body mass index is 26.43 kg/m.     12/29/2023    8:58 AM 11/10/2023   12:24 PM 06/15/2023    1:47 PM 12/28/2022    9:00 AM 10/26/2022   10:01 AM 05/18/2022    4:36 PM 10/07/2021    9:05 AM  Advanced Directives  Does Patient Have a Medical Advance Directive? No Yes No Yes Yes No No  Type of Aeronautical engineer of [place];Living will Healthcare Power of [place];Living will    Does patient want to make changes to medical advance directive?  No - Patient declined   No - Patient declined    Copy of Healthcare Power of Attorney in Chart?    No - copy requested     Would patient like information on creating a medical advance directive? No - Patient declined  No - Patient declined   No - Patient declined     Current Medications (verified) Outpatient Encounter Medications as of 12/29/2023  Medication Sig   aspirin  EC 81 MG tablet Take 81 mg by mouth daily.   azelastine  (ASTELIN ) 0.1 % nasal spray Place 2 sprays into both nostrils 2 (two) times daily.   benzonatate  (TESSALON ) 200 MG capsule Take 1 capsule (200 mg total) by mouth 3 (three) times daily as needed for cough.   Blood Glucose Monitoring  Suppl (ONETOUCH VERIO FLEX SYSTEM) w/Device KIT USE TO TEST BLOOD SUGAR DAILY AS DIRECTED   Calcium  Carb-Cholecalciferol (CALCIUM  + VITAMIN D3 PO) Take 1 tablet by mouth daily.   cetirizine  (ZYRTEC ) 10 MG tablet Take 0.5 tablets (5 mg total) by mouth daily.   esomeprazole  (NEXIUM ) 40 MG capsule Take 1 capsule (40 mg total) by mouth 2 (two) times daily before a meal.   fesoterodine  (TOVIAZ ) 4 MG TB24 tablet Take 4 mg by mouth daily.   fluticasone   (FLONASE ) 50 MCG/ACT nasal spray Place 2 sprays into both nostrils daily as needed for allergies or rhinitis.   gabapentin  (NEURONTIN ) 100 MG capsule Take 1 capsule (100 mg total) by mouth at bedtime.   gabapentin  (NEURONTIN ) 300 MG capsule Take 300 mg by mouth daily as needed (neuropathy).   glucose blood (ONETOUCH VERIO) test strip USE TO CHECK BLOOD SUGAR TWO TIMES A DAY   lidocaine  (LIDODERM ) 5 % 1 patch daily.   Misc Natural Products (MULLEIN GARLIC EAR DROPS OT) Place in ear(s).   montelukast  (SINGULAIR ) 10 MG tablet Take 1 tablet (10 mg total) by mouth at bedtime.   Multiple Vitamins-Minerals (ZINC PO) Take 1 tablet by mouth daily.   Pirfenidone  267 MG TABS Take 2 tablets (534 mg total) by mouth in the morning, at noon, and at bedtime. **low dose as maintenance**   rosuvastatin  (CRESTOR ) 20 MG tablet TAKE 1 TABLET BY MOUTH DAILY   Semaglutide ,0.25 or 0.5MG /DOS, (OZEMPIC , 0.25 OR 0.5 MG/DOSE,) 2 MG/3ML SOPN 0.5 mg by Other route once a week.   valsartan  (DIOVAN ) 160 MG tablet Take 1 tablet (160 mg total) by mouth daily.   celecoxib  (CELEBREX ) 200 MG capsule TAKE 1 CAPSULE BY MOUTH DAILY (Patient not taking: Reported on 12/29/2023)   No facility-administered encounter medications on file as of 12/29/2023.    Allergies (verified) Benicar [olmesartan]   History: Past Medical History:  Diagnosis Date   Alkaline phosphatase deficiency    w/u Ne   Allergic rhinitis    Allergy     Anxiety    Arthritis    Cataract    BILATERAL-REMOVED   DM2 (diabetes mellitus, type 2) (HCC)    GERD (gastroesophageal reflux disease)    Gout    Hemorrhoids    Hyperlipidemia    Hypertension    Interstitial lung disease (HCC)    Mild pulmonary hypertension (HCC)    NSVT (nonsustained ventricular tachycardia) (HCC)    Osteoporosis    PVC's (premature ventricular contractions)    Thyroid  nodule    small   Tubular adenoma of colon 2017   Urticaria    Uterine fibroid    Past Surgical History:   Procedure Laterality Date   CATARACT EXTRACTION Bilateral 2016   COLONOSCOPY  2007   echocardiogram (other)  01/16/2002   POLYPECTOMY     removed tumors from foot nerves  04/1999   stress cardiolite   02/12/2006   TOENAIL EXCISION     TUBAL LIGATION     TYMPANOSTOMY TUBE PLACEMENT     Family History  Problem Relation Age of Onset   Heart disease Mother    Allergic rhinitis Mother    Heart disease Father    Stomach cancer Maternal Grandmother    Heart disease Maternal Grandfather    Rectal cancer Maternal Grandfather    Colon cancer Neg Hx    Breast cancer Neg Hx    Colon polyps Neg Hx    Esophageal cancer Neg Hx    Social History   Socioeconomic  History   Marital status: Widowed    Spouse name: Not on file   Number of children: 2   Years of education: Not on file   Highest education level: Not on file  Occupational History   Occupation: OTC Marine scientist: UNEMPLOYED  Tobacco Use   Smoking status: Former    Current packs/day: 0.00    Average packs/day: 0.3 packs/day for 5.0 years (1.3 ttl pk-yrs)    Types: Cigarettes    Start date: 06/30/1963    Quit date: 06/29/1968    Years since quitting: 55.5   Smokeless tobacco: Never  Vaping Use   Vaping status: Never Used  Substance and Sexual Activity   Alcohol use: No    Alcohol/week: 0.0 standard drinks of alcohol   Drug use: No   Sexual activity: Not Currently  Other Topics Concern   Not on file  Social History Narrative   Widowed 2010.    Social Drivers of Corporate investment banker Strain: Low Risk  (12/29/2023)   Overall Financial Resource Strain (CARDIA)    Difficulty of Paying Living Expenses: Not very hard  Food Insecurity: No Food Insecurity (12/29/2023)   Hunger Vital Sign    Worried About Running Out of Food in the Last Year: Never true    Ran Out of Food in the Last Year: Never true  Transportation Needs: No Transportation Needs (12/29/2023)   PRAPARE - Administrator, Civil Service  (Medical): No    Lack of Transportation (Non-Medical): No  Physical Activity: Insufficiently Active (12/29/2023)   Exercise Vital Sign    Days of Exercise per Week: 2 days    Minutes of Exercise per Session: 20 min  Stress: No Stress Concern Present (12/29/2023)   [employer] of Occupational Health - Occupational Stress Questionnaire    Feeling of Stress: Not at all  Social Connections: Moderately Integrated (12/29/2023)   Social Connection and Isolation Panel    Frequency of Communication with Friends and Family: More than three times a week    Frequency of Social Gatherings with Friends and Family: More than three times a week    Attends Religious Services: More than 4 times per year    Active Member of [employer] or Organizations: Yes    Attends Banker Meetings: More than 4 times per year    Marital Status: Widowed    Tobacco Counseling Counseling given: Not Answered    Clinical Intake:  Pre-visit preparation completed: Yes  Pain : No/denies pain Pain Score: 0-No pain     BMI - recorded: 26.43 Nutritional Status: BMI 25 -29 Overweight Nutritional Risks: None Diabetes: Yes CBG done?: No Did pt. bring in CBG monitor from home?: No  Lab Results  Component Value Date   HGBA1C 5.8 (A) 09/07/2023   HGBA1C 5.9 (A) 03/08/2023   HGBA1C 5.6 11/19/2022     How often do you need to have someone help you when you read instructions, pamphlets, or other written materials from your doctor or pharmacy?: 1 - Never  Interpreter Needed?: No  Information entered by :: Dreux Mcgroarty,CMA   Activities of Daily Living     12/29/2023    9:02 AM 06/16/2023    2:56 AM  In your present state of health, do you have any difficulty performing the following activities:  Hearing? 0   Vision? 0   Difficulty concentrating or making decisions? 0   Walking or climbing stairs? 0   Dressing or bathing?  0   Doing errands, shopping? 0 0  Preparing Food and eating ? N   Using the  Toilet? N   In the past six months, have you accidently leaked urine? N   Do you have problems with loss of bowel control? N   Managing your Medications? N   Managing your Finances? N   Housekeeping or managing your Housekeeping? Y   Comment patient has help     Patient Care Team: Jason Leita Repine, FNP as PCP - General (Internal Medicine) Hobart Powell BRAVO, MD (Inactive) as PCP - Cardiology (Cardiology) Lenon Faden, MD (Internal Medicine) Perri Bjork, PA-C as Physician Assistant (Obstetrics and Gynecology) Claudene Victory ORN, MD (Inactive) as Consulting Physician (Cardiology) Debrah Lamar BIRCH, MD (Inactive) as Consulting Physician (Gastroenterology) Elner Arley LABOR, MD as Consulting Physician (Ophthalmology) Darlean Ozell NOVAK, MD as Consulting Physician (Pulmonary Disease) Claudene Arthea HERO, DO as Attending Physician (Family Medicine) Loreda Hacker, DPM as Consulting Physician (Podiatry) Lonzell Planas, RN as Registered Nurse  I have updated your Care Teams any recent Medical Services you may have received from other providers in the past year.     Assessment:   This is a routine wellness examination for [facility].  Hearing/Vision screen Hearing Screening - Comments:: No difficulties Vision Screening - Comments:: Patient wears glasses   Goals Addressed             This Visit's Progress    Patient Stated       To walk better       Depression Screen     12/29/2023    9:04 AM 12/28/2022    9:08 AM 11/24/2022    9:50 AM 08/07/2022    1:07 PM 03/03/2022    8:45 AM 10/07/2021    9:10 AM 09/16/2021    1:02 PM  PHQ 2/9 Scores  PHQ - 2 Score 0 0 0 0 0 0 0  PHQ- 9 Score 0          Fall Risk     12/29/2023    9:01 AM 12/23/2023    3:11 PM 12/02/2023    3:15 PM 11/24/2023    9:47 AM 09/13/2023    2:55 PM  Fall Risk   Falls in the past year? 0 0 0 0 1  Number falls in past yr: 0    0  Injury with Fall? 0    0  Risk for fall due to : No Fall Risks      Follow up Falls  evaluation completed        MEDICARE RISK AT HOME:  Medicare Risk at Home Any stairs in or around the home?: Yes If so, are there any without handrails?: No Home free of loose throw rugs in walkways, pet beds, electrical cords, etc?: Yes Adequate lighting in your home to reduce risk of falls?: Yes Life alert?: No Use of a cane, walker or w/c?: Yes (walker) Grab bars in the bathroom?: No Shower chair or bench in shower?: Yes Elevated toilet seat or a handicapped toilet?: Yes  TIMED UP AND GO:  Was the test performed?  No  Cognitive Function: 6CIT completed    06/16/2016    9:31 AM  MMSE - Mini Mental State Exam  Orientation to time 5   Orientation to Place 5   Registration 3   Attention/ Calculation 5   Recall 2   Language- name 2 objects 2   Language- repeat 1  Language- follow 3 step command 3  Language- read & follow direction 1   Write a sentence 1   Copy design 1   Total score 29      Data saved with a previous flowsheet row definition      03/17/2017    9:47 AM  Montreal Cognitive Assessment   Visuospatial/ Executive (0/5) 5  Naming (0/3) 2  Attention: Read list of digits (0/2) 2  Attention: Read list of letters (0/1) 1  Attention: Serial 7 subtraction starting at 100 (0/3) 3  Language: Repeat phrase (0/2) 2  Language : Fluency (0/1) 1  Abstraction (0/2) 2  Delayed Recall (0/5) 4  Orientation (0/6) 6  Total 28  Adjusted Score (based on education) 29      12/29/2023    9:01 AM 12/28/2022    9:08 AM  6CIT Screen  What Year? 0 points 0 points  What month? 0 points 0 points  What time? 0 points 0 points  Count back from 20 0 points 0 points  Months in reverse 0 points 0 points  Repeat phrase 0 points 2 points  Total Score 0 points 2 points    Immunizations Immunization History  Administered Date(s) Administered   Fluad Quad(high Dose 65+) 04/18/2019   Influenza Split 03/23/2011, 04/07/2013   Influenza Whole 04/13/2008, 04/29/2009, 03/29/2010,  03/29/2018   Influenza, High Dose Seasonal PF 03/11/2023   Influenza-Unspecified 04/08/2012, 03/28/2014, 03/14/2015, 03/13/2016, 04/15/2020   PFIZER(Purple Top)SARS-COV-2 Vaccination 08/13/2019, 09/05/2019, 04/15/2020   PPD Test 03/23/2011   Pfizer Covid-19 Vaccine Bivalent Booster 54yrs & up 04/29/2021   Pneumococcal Conjugate-13 01/10/2014   Pneumococcal Polysaccharide-23 07/07/2010, 03/31/2018   Td 12/27/2001   Tdap 08/06/2014   Zoster Recombinant(Shingrix) 03/31/2018    Screening Tests Health Maintenance  Topic Date Due   Diabetic kidney evaluation - Urine ACR  07/08/2011   Zoster Vaccines- Shingrix (2 of 2) 05/26/2018   COVID-19 Vaccine (5 - 2024-25 season) 02/28/2023   FOOT EXAM  08/31/2023   INFLUENZA VACCINE  01/28/2024   HEMOGLOBIN A1C  03/09/2024   Diabetic kidney evaluation - eGFR measurement  06/17/2024   DTaP/Tdap/Td (3 - Td or Tdap) 08/06/2024   OPHTHALMOLOGY EXAM  09/13/2024   Medicare Annual Wellness (AWV)  12/28/2024   Pneumococcal Vaccine: 50+ Years  Completed   DEXA SCAN  Completed   Hepatitis C Screening  Completed   Hepatitis B Vaccines  Aged Out   HPV VACCINES  Aged Out   Meningococcal B Vaccine  Aged Out   Colonoscopy  Discontinued    Health Maintenance  Health Maintenance Due  Topic Date Due   Diabetic kidney evaluation - Urine ACR  07/08/2011   Zoster Vaccines- Shingrix (2 of 2) 05/26/2018   COVID-19 Vaccine (5 - 2024-25 season) 02/28/2023   FOOT EXAM  08/31/2023   Health Maintenance Items Addressed:   Additional Screening:  Vision Screening: Recommended annual ophthalmology exams for early detection of glaucoma and other disorders of the eye. Would you like a referral to an eye doctor? No    Dental Screening: Recommended annual dental exams for proper oral hygiene  Community Resource Referral / Chronic Care Management: CRR required this visit?  No   CCM required this visit?  No   Plan:    I have personally reviewed and noted the  following in the patient's chart:   Medical and social history Use of alcohol, tobacco or illicit drugs  Current medications and supplements including opioid prescriptions. Patient is not currently taking opioid prescriptions. Functional ability and status Nutritional  status Physical activity Advanced directives List of other physicians Hospitalizations, surgeries, and ER visits in previous 12 months Vitals Screenings to include cognitive, depression, and falls Referrals and appointments  In addition, I have reviewed and discussed with patient certain preventive protocols, quality metrics, and best practice recommendations. A written personalized care plan for preventive services as well as general preventive health recommendations were provided to patient.   Lyle MARLA Right, [place]   12/29/2023   After Visit Summary: (MyChart) Due to this being a telephonic visit, the after visit summary with patients personalized plan was offered to patient via MyChart   Notes: Nothing significant to report at this time.

## 2023-12-29 NOTE — Patient Instructions (Signed)
 Ms. Muraski , Thank you for taking time out of your busy schedule to complete your Annual Wellness Visit with me. I enjoyed our conversation and look forward to speaking with you again next year. I, as well as your care team,  appreciate your ongoing commitment to your health goals. Please review the following plan we discussed and let me know if I can assist you in the future. Your Game plan/ To Do List    Referrals: If you haven't heard from the office you've been referred to, please reach out to them at the phone provided.  none Follow up Visits: Next Medicare AWV with our clinical staff: 01/04/2024   Have you seen your provider in the last 6 months (3 months if uncontrolled diabetes)? No Next Office Visit with your provider: 03/07/2024  Clinician Recommendations:  Aim for 30 minutes of exercise or brisk walking, 6-8 glasses of water, and 5 servings of fruits and vegetables each day.       This is a list of the screening recommended for you and due dates:  Health Maintenance  Topic Date Due   Yearly kidney health urinalysis for diabetes  07/08/2011   Zoster (Shingles) Vaccine (2 of 2) 05/26/2018   COVID-19 Vaccine (5 - 2024-25 season) 02/28/2023   Complete foot exam   08/31/2023   Flu Shot  01/28/2024   Hemoglobin A1C  03/09/2024   Yearly kidney function blood test for diabetes  06/17/2024   DTaP/Tdap/Td vaccine (3 - Td or Tdap) 08/06/2024   Eye exam for diabetics  09/13/2024   Medicare Annual Wellness Visit  12/28/2024   Pneumococcal Vaccine for age over 1  Completed   DEXA scan (bone density measurement)  Completed   Hepatitis C Screening  Completed   Hepatitis B Vaccine  Aged Out   HPV Vaccine  Aged Out   Meningitis B Vaccine  Aged Out   Colon Cancer Screening  Discontinued    Advanced directives: (Copy Requested) Please bring a copy of your health care power of attorney and living will to the office to be added to your chart at your convenience. You can mail to [facility] [address] [place], [place] [address] or email to [email or web address] Advance Care Planning is important because it:  [x]  Makes sure you receive the medical care that is consistent with your values, goals, and preferences  [x]  It provides guidance to your family and loved ones and reduces their decisional burden about whether or not they are making the right decisions based on your wishes.  Follow the link provided in your after visit summary or read over the paperwork we have mailed to you to help you started getting your Advance Directives in place. If you need assistance in completing these, please reach out to us  so that we can help you!  See attachments for Preventive Care and Fall Prevention Tips.

## 2023-12-30 NOTE — Telephone Encounter (Signed)
 Hand delivered order and notes to Rosalyn at [facility] today. Unable to give any timeframe when this would be completed.

## 2024-01-05 NOTE — Progress Notes (Unsigned)
 [facility] [address] [place] [address] Phone: [phone]   Assessment and Plan:     There are no diagnoses linked to this encounter.  ***   Pertinent previous records reviewed include ***    Follow Up: ***     Subjective:   I, Amy Escobar, am serving as a Neurosurgeon for Doctor Morene Mace   Chief Complaint: bilateral knee injection    HPI:  09/30/2021 Patient given injection today and tolerated the procedure well, discussed icing regimen and home exercise, discussed which activities to do which wants to avoid.  Discussed which activities to potentially avoid.  Patient does not want to do the home exercises on a regular basis at the moment.  Follow-up with me again 6 to 8 weeks.  Can repeat every 3 months if needed   Chronic, with exacerbation.  Has had this difficulty for some time.  Discussed the possibility of a lumbar radiculopathy with radicular symptoms.  Discussed which activities to do which ones to avoid.  Follow-up again in 3 months   Update 12/09/2021 Amy Escobar is a 78 y.o. female coming in with complaint of B hip and B knee pain. Saw Dr. Mace twice in May 2023 for shoulder, elbow, and wrist pain after a fall. Worsening pain in the knees  Patient states still sore from fall. Time for injections in knees and wondering about GT injections because she has to see a kidney doctor.     Patient when seen another provider was found to have a possible lung nodule.  Was sent for a CT chest with contrast that did not show any significant nodule noted on CT scan but was found to have significant right hydronephrosis.  And then was sent for CT abdomen pelvis which did show that there was a significant right-sided hydronephrosis with possible stricture and no sign of true stone formation.   Patient also GFR has been decreased with most recent labs of 59.8   02/27/2022 Patient states wants knee and injection and  hip injections   03/11/2022 Amy Escobar is a 78 y.o. female coming in with complaint of B knee pain. Injected by Dr. Mace on 02/27/2022. Durolane approved. Patient states that she is would like injections in backside and gel injections.    04/14/2022 Patient states she is in a lot of pain  , her left knee goes out and her right knee hurts a lot    04/22/2022 Patient states still in pain , left is worse than right    04/26/2023 Patient states here for injection wants to discuss where to put them , has been working at the furniture market    05/26/2023  01/06/2024 Patient states   Relevant Historical Information: DM type II, hypertension  Additional pertinent review of systems negative.   Current Outpatient Medications:    aspirin  EC 81 MG tablet, Take 81 mg by mouth daily., Disp: , Rfl:    azelastine  (ASTELIN ) 0.1 % nasal spray, Place 2 sprays into both nostrils 2 (two) times daily., Disp: 30 mL, Rfl: 5   benzonatate  (TESSALON ) 200 MG capsule, Take 1 capsule (200 mg total) by mouth 3 (three) times daily as needed for cough., Disp: 30 capsule, Rfl: 1   Blood Glucose Monitoring Suppl (ONETOUCH VERIO FLEX SYSTEM) w/Device KIT, USE TO TEST BLOOD SUGAR DAILY AS DIRECTED, Disp: 1 kit, Rfl: 0   Calcium  Carb-Cholecalciferol (CALCIUM  + VITAMIN D3 PO), Take 1 tablet by  mouth daily., Disp: , Rfl:    celecoxib  (CELEBREX ) 200 MG capsule, TAKE 1 CAPSULE BY MOUTH DAILY (Patient not taking: Reported on 12/29/2023), Disp: 30 capsule, Rfl: 1   cetirizine  (ZYRTEC ) 10 MG tablet, Take 0.5 tablets (5 mg total) by mouth daily., Disp: 30 tablet, Rfl: 5   esomeprazole  (NEXIUM ) 40 MG capsule, Take 1 capsule (40 mg total) by mouth 2 (two) times daily before a meal., Disp: 180 capsule, Rfl: 1   fesoterodine  (TOVIAZ ) 4 MG TB24 tablet, Take 4 mg by mouth daily., Disp: , Rfl:    fluticasone  (FLONASE ) 50 MCG/ACT nasal spray, Place 2 sprays into both nostrils daily as needed for allergies or rhinitis., Disp: 16 g,  Rfl: 5   gabapentin  (NEURONTIN ) 100 MG capsule, Take 1 capsule (100 mg total) by mouth at bedtime., Disp: 90 capsule, Rfl: 3   gabapentin  (NEURONTIN ) 300 MG capsule, Take 300 mg by mouth daily as needed (neuropathy)., Disp: , Rfl:    glucose blood (ONETOUCH VERIO) test strip, USE TO CHECK BLOOD SUGAR TWO TIMES A DAY, Disp: 100 strip, Rfl: 3   lidocaine  (LIDODERM ) 5 %, 1 patch daily., Disp: , Rfl:    Misc Natural Products (MULLEIN GARLIC EAR DROPS OT), Place in ear(s)., Disp: , Rfl:    montelukast  (SINGULAIR ) 10 MG tablet, Take 1 tablet (10 mg total) by mouth at bedtime., Disp: 30 tablet, Rfl: 5   Multiple Vitamins-Minerals (ZINC PO), Take 1 tablet by mouth daily., Disp: , Rfl:    Pirfenidone  267 MG TABS, Take 2 tablets (534 mg total) by mouth in the morning, at noon, and at bedtime. **low dose as maintenance**, Disp: 540 tablet, Rfl: 0   rosuvastatin  (CRESTOR ) 20 MG tablet, TAKE 1 TABLET BY MOUTH DAILY, Disp: 90 tablet, Rfl: 2   Semaglutide ,0.25 or 0.5MG /DOS, (OZEMPIC , 0.25 OR 0.5 MG/DOSE,) 2 MG/3ML SOPN, 0.5 mg by Other route once a week., Disp: 9 mL, Rfl: 1   valsartan  (DIOVAN ) 160 MG tablet, Take 1 tablet (160 mg total) by mouth daily., Disp: 90 tablet, Rfl: 2   Objective:     There were no vitals filed for this visit.    There is no height or weight on file to calculate BMI.    Physical Exam:    ***   Electronically signed by:  Odis Mace [name] 7:32 AM 01/05/24

## 2024-01-06 ENCOUNTER — Other Ambulatory Visit

## 2024-01-06 ENCOUNTER — Telehealth: Payer: Self-pay | Admitting: Sports Medicine

## 2024-01-06 ENCOUNTER — Ambulatory Visit: Admitting: Sports Medicine

## 2024-01-06 VITALS — BP 132/80 | HR 92 | Ht 64.0 in | Wt 154.0 lb

## 2024-01-06 DIAGNOSIS — M170 Bilateral primary osteoarthritis of knee: Secondary | ICD-10-CM

## 2024-01-06 DIAGNOSIS — M25562 Pain in left knee: Secondary | ICD-10-CM | POA: Diagnosis not present

## 2024-01-06 DIAGNOSIS — G8929 Other chronic pain: Secondary | ICD-10-CM

## 2024-01-06 DIAGNOSIS — M25561 Pain in right knee: Secondary | ICD-10-CM

## 2024-01-06 LAB — HEPATIC FUNCTION PANEL
AG Ratio: 1.4 (calc) (ref 1.0–2.5)
ALT: 26 U/L (ref 6–29)
AST: 33 U/L (ref 10–35)
Albumin: 4.2 g/dL (ref 3.6–5.1)
Alkaline phosphatase (APISO): 111 U/L (ref 37–153)
Bilirubin, Direct: 0.1 mg/dL (ref 0.0–0.2)
Globulin: 3 g/dL (ref 1.9–3.7)
Indirect Bilirubin: 0.2 mg/dL (ref 0.2–1.2)
Total Bilirubin: 0.3 mg/dL (ref 0.2–1.2)
Total Protein: 7.2 g/dL (ref 6.1–8.1)

## 2024-01-06 NOTE — Patient Instructions (Addendum)
 Bilat gel  Follow up as needed

## 2024-01-06 NOTE — Telephone Encounter (Signed)
 Ran benefits for Durolane case ID [id number]

## 2024-01-10 ENCOUNTER — Telehealth: Payer: Self-pay

## 2024-01-11 ENCOUNTER — Other Ambulatory Visit: Payer: Self-pay

## 2024-01-11 VITALS — BP 130/78

## 2024-01-11 DIAGNOSIS — Z5986 Financial insecurity: Secondary | ICD-10-CM

## 2024-01-11 NOTE — Patient Outreach (Signed)
 Complex Care Management   Visit Note  01/11/2024  Name:  Amy Escobar MRN: [id number] DOB: 1946-01-23  Situation: Referral received for Complex Care Management related to Diabetes with Complications and HTN, ILD I obtained verbal consent from Patient.  Visit completed with patient  on the phone  Background:   Past Medical History:  Diagnosis Date   Alkaline phosphatase deficiency    w/u Ne   Allergic rhinitis    Allergy     Anxiety    Arthritis    Cataract    BILATERAL-REMOVED   DM2 (diabetes mellitus, type 2) (HCC)    GERD (gastroesophageal reflux disease)    Gout    Hemorrhoids    Hyperlipidemia    Hypertension    Interstitial lung disease (HCC)    Mild pulmonary hypertension (HCC)    NSVT (nonsustained ventricular tachycardia) (HCC)    Osteoporosis    PVC's (premature ventricular contractions)    Thyroid  nodule    small   Tubular adenoma of colon 2017   Urticaria    Uterine fibroid     Assessment: Patient Reported Symptoms:  Cognitive Cognitive Status: Alert and oriented to person, place, and time      Neurological Neurological Review of Symptoms: No symptoms reported    HEENT HEENT Symptoms Reported: No symptoms reported      Cardiovascular Cardiovascular Symptoms Reported: No symptoms reported Does patient have uncontrolled Hypertension?: Yes Is patient checking Blood Pressure at home?: Yes Patient's Recent BP reading at home: 130/78 01/11/24 Cardiovascular Management Strategies: Medication therapy, Routine screening  Respiratory Respiratory Symptoms Reported: Productive cough Other Respiratory Symptoms: patient reports received POC, using 2L with great improvement.  able to get out of the house much more frequently Respiratory Management Strategies: Routine screening, Medication therapy, Oxygen  therapy, Adequate rest  Endocrine Endocrine Symptoms Reported: No symptoms reported Is patient diabetic?: Yes Is patient checking blood sugars at home?:  Yes List most recent blood sugar readings, include date and time of day: fasting 123 01/10/22    Gastrointestinal Gastrointestinal Symptoms Reported: No symptoms reported      Genitourinary Genitourinary Symptoms Reported: No symptoms reported Other Genitourinary Symptoms: self caths PRN    Integumentary Integumentary Symptoms Reported: No symptoms reported    Musculoskeletal Additional Musculoskeletal Details: patient reports increased activitiy now that she has POC Musculoskeletal Management Strategies: Activity, Medication therapy, Routine screening, Exercise Falls in the past year?: No    Psychosocial Psychosocial Symptoms Reported: No symptoms reported            12/29/2023    9:04 AM  Depression screen PHQ 2/9  Decreased Interest 0  Down, Depressed, Hopeless 0  PHQ - 2 Score 0  Altered sleeping 0  Tired, decreased energy 0  Change in appetite 0  Feeling bad or failure about yourself  0  Trouble concentrating 0  Moving slowly or fidgety/restless 0  Suicidal thoughts 0  PHQ-9 Score 0  Difficult doing work/chores Not difficult at all    Vitals:   01/11/24 1319  BP: 130/78    Medications Reviewed Today     Reviewed by Lonzell Planas, RN (Registered Nurse) on 01/11/24 at 1317  Med List Status: <None>   Medication Order Taking? Sig Documenting Provider Last Dose Status Informant  aspirin  EC 81 MG tablet [id number]  Take 81 mg by mouth daily. [provider]  Active Self, Pharmacy Records  azelastine  (ASTELIN ) 0.1 % nasal spray [id number]  Place 2 sprays into both nostrils 2 (two) times daily. Cari Dragon  M, FNP  Active   benzonatate  (TESSALON ) 200 MG capsule [id number]  Take 1 capsule (200 mg total) by mouth 3 (three) times daily as needed for cough. Kara Dorn NOVAK, MD  Active   Blood Glucose Monitoring Suppl [facility] VERIO FLEX SYSTEM) w/Device PRESSLEY [id number]  USE TO TEST BLOOD SUGAR DAILY AS DIRECTED Shamleffer, Donell Cardinal, MD  Active Self, Pharmacy  Records  Calcium  Carb-Cholecalciferol (CALCIUM  + VITAMIN D3 PO) [id number]  Take 1 tablet by mouth daily. [provider]  Active Self, Pharmacy Records  celecoxib  (CELEBREX ) 200 MG capsule [id number]  TAKE 1 CAPSULE BY MOUTH DAILY  Patient not taking: Reported on 12/29/2023   Gretta Bertrum ORN, PA-C  Active   cetirizine  (ZYRTEC ) 10 MG tablet [id number]  Take 0.5 tablets (5 mg total) by mouth daily. Cari Arlean HERO, FNP  Active   esomeprazole  (NEXIUM ) 40 MG capsule [id number]  Take 1 capsule (40 mg total) by mouth 2 (two) times daily before a meal. Jason Leita Repine, FNP  Active   fesoterodine  (TOVIAZ ) 4 MG TB24 tablet [id number]  Take 4 mg by mouth daily. [provider]  Active Self, Pharmacy Records  fluticasone  (FLONASE ) 50 MCG/ACT nasal spray [id number]  Place 2 sprays into both nostrils daily as needed for allergies or rhinitis. Cari Arlean HERO, FNP  Active   gabapentin  (NEURONTIN ) 100 MG capsule [id number]  Take 1 capsule (100 mg total) by mouth at bedtime. Shamleffer, Donell Cardinal, MD  Active Self, Pharmacy Records           Med Note (COFFELL, JON HERO   Wed Jun 16, 2023 11:41 AM) Pt states she uses 2 strengths of gabapentin .  gabapentin  (NEURONTIN ) 300 MG capsule [id number]  Take 300 mg by mouth daily as needed (neuropathy). [provider]  Active Self, Pharmacy Records  glucose blood [facility] VERIO) test strip [id number]  USE TO CHECK BLOOD SUGAR TWO TIMES A DAY Shamleffer, Donell Cardinal, MD  Active   lidocaine  (LIDODERM ) 5 % [id number]  1 patch daily. [provider]  Active   Misc Natural Products (MULLEIN GARLIC EAR DROPS OT) [id number]  Place in ear(s). [provider]  Active   montelukast  (SINGULAIR ) 10 MG tablet [id number]  Take 1 tablet (10 mg total) by mouth at bedtime. Jeneal Danita Macintosh, MD  Active   Multiple Vitamins-Minerals (ZINC PO) [id number]  Take 1 tablet by mouth daily. [provider]  Active Self, Pharmacy Records   Pirfenidone  267 MG TABS [id number]  Take 2 tablets (534 mg total) by mouth in the morning, at noon, and at bedtime. **low dose as maintenanceTHORA Hope Almarie ORN, NP  Active   rosuvastatin  (CRESTOR ) 20 MG tablet [id number]  TAKE 1 TABLET BY MOUTH DAILY Walker, Caitlin S, NP  Active   Semaglutide ,0.25 or 0.5MG /DOS, (OZEMPIC , 0.25 OR 0.5 MG/DOSE,) 2 MG/3ML SOPN [id number]  0.5 mg by Other route once a week. Shamleffer, Ibtehal Jaralla, MD  Active   valsartan  (DIOVAN ) 160 MG tablet [id number]  Take 1 tablet (160 mg total) by mouth daily. Vannie Reche RAMAN, NP  Active             Recommendation:   Referral to: BSW for assistance with medical bills and possible financial assistance  Follow Up Plan:   Referral to BSW  SIG  Olam Idol BSN RN CCM [place]  [facility], [facility] Health RN Care Manager Direct Dial: [phone] Fax: [phone]

## 2024-01-11 NOTE — Patient Instructions (Signed)
 Visit Information  Thank you for taking time to visit with me today. Please don't hesitate to contact me if I can be of assistance to you before our next scheduled appointment.  Our next appointment is by telephone on 02/10/24 at 1:00 with Juana Wallace Please call the care guide team at [phone] if you need to cancel or reschedule your appointment.   Following is a copy of your care plan:   Goals Addressed             This Visit's Progress    VBCI RN Care Plan   On track    Problems:  Chronic Disease Management support and education needs related to DMII, HTN, and Pulmonary Disease  Goal: Over the next 30 days the Patient will continue to work with Medical illustrator and/or Social Worker to address care management and care coordination needs related to DMII, HTN, and Pulmonary Disease as evidenced by adherence to care management team scheduled appointments     demonstrate Ongoing adherence to prescribed treatment plan for DMII, HTN, and Pulmonary Disease as evidenced by taking all medications as prescribed, completing all ordered tests, attending all medical appointments take all medications exactly as prescribed and will call provider for medication related questions as evidenced by chart review and patient report     Interventions:   Diabetes Interventions: Assessed patient's understanding of A1c goal: <6.5% Provided education to patient about basic DM disease process Reviewed medications with patient and discussed importance of medication adherence Counseled on importance of regular laboratory monitoring as prescribed Discussed plans with patient for ongoing care management follow up and provided patient with direct contact information for care management team Review of patient status, including review of consultants reports, relevant laboratory and other test results, and medications completed Screening for signs and symptoms of depression related to chronic disease state   Assessed social determinant of health barriers Lab Results  Component Value Date   HGBA1C 5.8 (A) 09/07/2023    Hypertension Interventions: Last practice recorded BP readings:  BP Readings from Last 3 Encounters:  01/11/24 130/78  01/06/24 132/80  12/29/23 126/64   Most recent eGFR/CrCl:  Lab Results  Component Value Date   EGFR 75 01/21/2023    No components found for: CRCL  Evaluation of current treatment plan related to hypertension self management and patient's adherence to plan as established by provider Provided education to patient re: stroke prevention, s/s of heart attack and stroke Reviewed medications with patient and discussed importance of compliance Discussed plans with patient for ongoing care management follow up and provided patient with direct contact information for care management team Advised patient, providing education and rationale, to monitor blood pressure daily and record, calling PCP for findings outside established parameters Reviewed scheduled/upcoming provider appointments including:  Discussed complications of poorly controlled blood pressure such as heart disease, stroke, circulatory complications, vision complications, kidney impairment, sexual dysfunction Screening for signs and symptoms of depression related to chronic disease state  Assessed social determinant of health barriers  Patient Self-Care Activities:  Attend all scheduled provider appointments Attend church or other social activities Call pharmacy for medication refills 3-7 days in advance of running out of medications Call provider office for new concerns or questions  Take medications as prescribed   Work with the social worker to address care coordination needs and will continue to work with the clinical team to address health care and disease management related needs  Plan:  Telephone follow up appointment with care management team  member scheduled for:  02/10/24 at  1:00             Please call the Suicide and Crisis Lifeline: 988 call the [place]  National Suicide Prevention Lifeline: (519)742-4993 or TTY: 364-731-0208 TTY (678) 124-3767) to talk to a trained counselor call 1-800-273-TALK (toll free, 24 hour hotline) if you are experiencing a Mental Health or Behavioral Health Crisis or need someone to talk to.  Patient verbalizes understanding of instructions and care plan provided today and agrees to view in MyChart. Active MyChart status and patient understanding of how to access instructions and care plan via MyChart confirmed with patient.     SIGNATURE  Olam Idol BSN RN CCM [place]  [facility], [facility] Health RN Care Manager Direct Dial: [phone] Fax: [phone]

## 2024-01-14 ENCOUNTER — Other Ambulatory Visit: Payer: Self-pay

## 2024-01-14 DIAGNOSIS — E1159 Type 2 diabetes mellitus with other circulatory complications: Secondary | ICD-10-CM

## 2024-01-14 DIAGNOSIS — E113393 Type 2 diabetes mellitus with moderate nonproliferative diabetic retinopathy without macular edema, bilateral: Secondary | ICD-10-CM

## 2024-01-14 DIAGNOSIS — E1142 Type 2 diabetes mellitus with diabetic polyneuropathy: Secondary | ICD-10-CM

## 2024-01-14 MED ORDER — ACCU-CHEK GUIDE TEST VI STRP
ORAL_STRIP | 12 refills | Status: AC
Start: 2024-01-14 — End: ?

## 2024-01-14 MED ORDER — ACCU-CHEK GUIDE W/DEVICE KIT
PACK | 0 refills | Status: AC
Start: 2024-01-14 — End: ?

## 2024-01-14 MED ORDER — ACCU-CHEK SOFTCLIX LANCETS MISC: 12 refills | Status: AC

## 2024-01-14 NOTE — Telephone Encounter (Signed)
 Patient called stating she needed a new RX for a different meter and supplies due to insurance coverage. [facility] sent in mychart message sent to patient making her aware.

## 2024-01-19 ENCOUNTER — Other Ambulatory Visit: Payer: Self-pay

## 2024-01-19 ENCOUNTER — Other Ambulatory Visit (HOSPITAL_COMMUNITY): Payer: Self-pay

## 2024-01-19 NOTE — Progress Notes (Signed)
 Specialty Pharmacy Refill Coordination Note  Spoke with Amy Escobar [facility]  Amy Escobar is a 78 y.o. female contacted today regarding refills of specialty medication(s) Pirfenidone   Doses on hand: 21  Patient requested: Delivery   Delivery date: 02/09/24   Verified address: [address]  [place] [address]  Medication will be filled on 02/08/24.

## 2024-01-20 NOTE — Telephone Encounter (Signed)
 Schedule patient when medication is in stock please   Durolane authorized for bilateral knee Patient covered at 100% of the allowable amount Deductible and OOP do not apply NO PA REQUIRED  Reference # [id number]

## 2024-01-24 NOTE — Telephone Encounter (Signed)
 Patient was given cortisone at her 7/10 visit. Holding gel approval until needed.

## 2024-01-27 ENCOUNTER — Other Ambulatory Visit: Payer: Self-pay

## 2024-02-01 NOTE — Telephone Encounter (Signed)
 Scheduled

## 2024-02-02 NOTE — Progress Notes (Unsigned)
 [facility] [address] [place] [address] Phone: [phone]   Assessment and Plan:     There are no diagnoses linked to this encounter.  ***   Pertinent previous records reviewed include ***    Follow Up: ***     Subjective:   I, Amy Escobar, am serving as a Neurosurgeon for Doctor Morene Mace   Chief Complaint: bilateral knee injection    HPI:  09/30/2021 Patient given injection today and tolerated the procedure well, discussed icing regimen and home exercise, discussed which activities to do which wants to avoid.  Discussed which activities to potentially avoid.  Patient does not want to do the home exercises on a regular basis at the moment.  Follow-up with me again 6 to 8 weeks.  Can repeat every 3 months if needed   Chronic, with exacerbation.  Has had this difficulty for some time.  Discussed the possibility of a lumbar radiculopathy with radicular symptoms.  Discussed which activities to do which ones to avoid.  Follow-up again in 3 months   Update 12/09/2021 Amy Escobar is a 78 y.o. female coming in with complaint of B hip and B knee pain. Saw Dr. Mace twice in May 2023 for shoulder, elbow, and wrist pain after a fall. Worsening pain in the knees  Patient states still sore from fall. Time for injections in knees and wondering about GT injections because she has to see a kidney doctor.     Patient when seen another provider was found to have a possible lung nodule.  Was sent for a CT chest with contrast that did not show any significant nodule noted on CT scan but was found to have significant right hydronephrosis.  And then was sent for CT abdomen pelvis which did show that there was a significant right-sided hydronephrosis with possible stricture and no sign of true stone formation.   Patient also GFR has been decreased with most recent labs of 59.8   02/27/2022 Patient states wants knee and injection and  hip injections   03/11/2022 Amy Escobar is a 78 y.o. female coming in with complaint of B knee pain. Injected by Dr. Mace on 02/27/2022. Durolane approved. Patient states that she is would like injections in backside and gel injections.    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 Would like a gel prior auth   02/03/2024 Patient states    Relevant Historical Information: DM type II, hypertension  Additional pertinent review of systems negative.   Current Outpatient Medications:    Accu-Chek Softclix Lancets lancets, Use to prick finger to check blood glucose, Disp: 100 each, Rfl: 12   aspirin  EC 81 MG tablet, Take 81 mg by mouth daily., Disp: , Rfl:    azelastine  (ASTELIN ) 0.1 % nasal spray, Place 2 sprays into both nostrils 2 (two) times daily., Disp: 30 mL, Rfl: 5   benzonatate  (TESSALON ) 200 MG capsule, Take 1 capsule (200 mg total) by mouth 3 (three) times daily as needed for cough., Disp: 30 capsule, Rfl: 1  Blood Glucose Monitoring Suppl (ACCU-CHEK GUIDE) w/Device KIT, Use to check blood glucose daily as directed, Disp: 1 kit, Rfl: 0   Blood Glucose Monitoring Suppl (ONETOUCH VERIO FLEX SYSTEM) w/Device KIT, USE TO TEST BLOOD SUGAR DAILY AS DIRECTED, Disp: 1 kit, Rfl: 0   Calcium  Carb-Cholecalciferol (CALCIUM  + VITAMIN D3 PO), Take 1 tablet by mouth daily., Disp: , Rfl:    celecoxib  (CELEBREX ) 200 MG capsule, TAKE 1 CAPSULE BY MOUTH DAILY (Patient not taking: Reported on 12/29/2023), Disp: 30 capsule, Rfl: 1   cetirizine  (ZYRTEC ) 10 MG tablet, Take 0.5 tablets (5 mg total) by mouth daily., Disp: 30 tablet, Rfl: 5   esomeprazole  (NEXIUM ) 40 MG  capsule, Take 1 capsule (40 mg total) by mouth 2 (two) times daily before a meal., Disp: 180 capsule, Rfl: 1   fesoterodine  (TOVIAZ ) 4 MG TB24 tablet, Take 4 mg by mouth daily., Disp: , Rfl:    fluticasone  (FLONASE ) 50 MCG/ACT nasal spray, Place 2 sprays into both nostrils daily as needed for allergies or rhinitis., Disp: 16 g, Rfl: 5   gabapentin  (NEURONTIN ) 100 MG capsule, Take 1 capsule (100 mg total) by mouth at bedtime., Disp: 90 capsule, Rfl: 3   gabapentin  (NEURONTIN ) 300 MG capsule, Take 300 mg by mouth daily as needed (neuropathy)., Disp: , Rfl:    glucose blood (ACCU-CHEK GUIDE TEST) test strip, Use to test blood glucose daily, Disp: 300 each, Rfl: 12   glucose blood (ONETOUCH VERIO) test strip, USE TO CHECK BLOOD SUGAR TWO TIMES A DAY, Disp: 100 strip, Rfl: 3   lidocaine  (LIDODERM ) 5 %, 1 patch daily., Disp: , Rfl:    Misc Natural Products (MULLEIN GARLIC EAR DROPS OT), Place in ear(s)., Disp: , Rfl:    montelukast  (SINGULAIR ) 10 MG tablet, Take 1 tablet (10 mg total) by mouth at bedtime., Disp: 30 tablet, Rfl: 5   Multiple Vitamins-Minerals (ZINC PO), Take 1 tablet by mouth daily., Disp: , Rfl:    Pirfenidone  267 MG TABS, Take 2 tablets (534 mg total) by mouth in the morning, at noon, and at bedtime. **low dose as maintenance**, Disp: 540 tablet, Rfl: 0   rosuvastatin  (CRESTOR ) 20 MG tablet, TAKE 1 TABLET BY MOUTH DAILY, Disp: 90 tablet, Rfl: 2   Semaglutide ,0.25 or 0.5MG /DOS, (OZEMPIC , 0.25 OR 0.5 MG/DOSE,) 2 MG/3ML SOPN, 0.5 mg by Other route once a week., Disp: 9 mL, Rfl: 1   valsartan  (DIOVAN ) 160 MG tablet, Take 1 tablet (160 mg total) by mouth daily., Disp: 90 tablet, Rfl: 2   Objective:     There were no vitals filed for this visit.    There is no height or weight on file to calculate BMI.    Physical Exam:    ***   Electronically signed by:  Odis Mace D.CLEMENTEEN AMYE [facility] 7:49 AM 02/02/24

## 2024-02-03 ENCOUNTER — Ambulatory Visit (INDEPENDENT_AMBULATORY_CARE_PROVIDER_SITE_OTHER): Admitting: Sports Medicine

## 2024-02-03 ENCOUNTER — Telehealth: Payer: Self-pay

## 2024-02-03 DIAGNOSIS — M25562 Pain in left knee: Secondary | ICD-10-CM

## 2024-02-03 DIAGNOSIS — M170 Bilateral primary osteoarthritis of knee: Secondary | ICD-10-CM

## 2024-02-03 DIAGNOSIS — G8929 Other chronic pain: Secondary | ICD-10-CM

## 2024-02-03 DIAGNOSIS — M25561 Pain in right knee: Secondary | ICD-10-CM | POA: Diagnosis not present

## 2024-02-03 MED ORDER — SODIUM HYALURONATE 60 MG/3ML IX PRSY
60.0000 mg | PREFILLED_SYRINGE | Freq: Once | INTRA_ARTICULAR | Status: AC
  Administered 2024-02-03: 60 mg via INTRA_ARTICULAR

## 2024-02-03 NOTE — Progress Notes (Signed)
 Complex Care Management Note Care Guide Note  02/03/2024 Name: Amy Escobar MRN: [id number] DOB: June 02, 1946   Complex Care Management Outreach Attempts: An unsuccessful telephone outreach was attempted today to offer the patient information about available complex care management services.  Follow Up Plan:  Additional outreach attempts will be made to offer the patient complex care management information and services.   Encounter Outcome:  No Answer  Dreama [facility]  [facility], [facility] Health Care Management Assistant Direct Dial: [phone]  Fax: [phone]

## 2024-02-08 ENCOUNTER — Other Ambulatory Visit (HOSPITAL_COMMUNITY): Payer: Self-pay

## 2024-02-08 ENCOUNTER — Other Ambulatory Visit: Payer: Self-pay

## 2024-02-08 NOTE — Progress Notes (Signed)
 Complex Care Management Note  Care Guide Note 02/08/2024 Name: Amy Escobar MRN: [id number] DOB: 12-11-1945  [name] is a 78 y.o. year old female who sees Jason Leita Repine, FNP for primary care. I reached out to Ronal Charma Single by phone today to offer complex care management services.  [name] was given information about Complex Care Management services today including:   The Complex Care Management services include support from the care team which includes your Nurse Care Manager, Clinical Social Worker, or Pharmacist.  The Complex Care Management team is here to help remove barriers to the health concerns and goals most important to you. Complex Care Management services are voluntary, and the patient may decline or stop services at any time by request to their care team member.   Complex Care Management Consent Status: Patient agreed to services and verbal consent obtained.   Follow up plan:  Telephone appointment with complex care management team member scheduled for:  02/16/24 at 2:00 p.m.  Encounter Outcome:  Patient Scheduled  Dreama [facility]  [facility], [facility] Health Care Management Assistant Direct Dial: [phone]  Fax: [phone]

## 2024-02-11 ENCOUNTER — Other Ambulatory Visit: Payer: Self-pay

## 2024-02-11 NOTE — Patient Outreach (Signed)
 Complex Care Management   Visit Note  02/11/2024  Name:  Amy Escobar MRN: [id number] DOB: September 06, 1945  Situation: Referral received for Complex Care Management related to DM, HTN and pulmonary disease I obtained verbal consent from Patient.  Visit completed with patient  on the phone  Background:   Past Medical History:  Diagnosis Date   Alkaline phosphatase deficiency    w/u Ne   Allergic rhinitis    Allergy     Anxiety    Arthritis    Cataract    BILATERAL-REMOVED   DM2 (diabetes mellitus, type 2) (HCC)    GERD (gastroesophageal reflux disease)    Gout    Hemorrhoids    Hyperlipidemia    Hypertension    Interstitial lung disease (HCC)    Mild pulmonary hypertension (HCC)    NSVT (nonsustained ventricular tachycardia) (HCC)    Osteoporosis    PVC's (premature ventricular contractions)    Thyroid  nodule    small   Tubular adenoma of colon 2017   Urticaria    Uterine fibroid     Assessment: Amy Escobar denies any questions or concerns at this time. She expresses that she is doing the things she needs to do to take care of her health. Patient Reported Symptoms:  Cognitive Cognitive Status: No symptoms reported, Insightful and able to interpret abstract concepts, Normal speech and language skills      Neurological Neurological Review of Symptoms: Other: [place] Neurological Symptoms/Conditions [RPT]: patient reports history of neuropathy feet legs and fingers. taking neurontin  which patient reports helps    HEENT HEENT Symptoms Reported: Other: (reports sometimes has to blow her nose, has allergies: takes cetirizine  and montelukast  for allergies. patient reports helps manage the symptoms) HEENT Management Strategies: Medication therapy    Cardiovascular Cardiovascular Symptoms Reported: No symptoms reported Does patient have uncontrolled Hypertension?: No Is patient checking Blood Pressure at home?: Yes Cardiovascular Management Strategies: Medication therapy, Routine  screening Weight: 156 lb (70.8 kg) (taken today during assessment)  Respiratory Respiratory Symptoms Reported: Shortness of breath Other Respiratory Symptoms: patient reports SOB when overexertion. continues to wear Oxygen  2l/[place]. reports pulse oximetry 96%. managed by pulmonology    Endocrine Endocrine Symptoms Reported: No symptoms reported Is patient diabetic?: No Is patient checking blood sugars at home?: Yes List most recent blood sugar readings, include date and time of day: fasting BS 112 patient reports BS ranges 120's    Gastrointestinal Gastrointestinal Symptoms Reported: No symptoms reported      Genitourinary Genitourinary Symptoms Reported: No symptoms reported Other Genitourinary Symptoms: reports history of urinary retention-self catherize three times/day. managed by [facility] Genitourinary Self-Management Outcome: 4 (good)  Integumentary Integumentary Symptoms Reported: No symptoms reported Additional Integumentary Details: patient states she uses sun screen and wears hat Skin Management Strategies: Routine screening  Musculoskeletal Musculoskelatal Symptoms Reviewed: Difficulty walking Additional Musculoskeletal Details: reports injection to both knees due osteoarthritis. patient reports injections helps. patient reprots uses Cane as needed Musculoskeletal Management Strategies: Medication therapy, Medical device, Routine screening, Activity Musculoskeletal Self-Management Outcome: 4 (good)      Psychosocial Psychosocial Symptoms Reported: No symptoms reported            12/29/2023    9:04 AM  Depression screen PHQ 2/9  Decreased Interest 0  Down, Depressed, Hopeless 0  PHQ - 2 Score 0  Altered sleeping 0  Tired, decreased energy 0  Change in appetite 0  Feeling bad or failure about yourself  0  Trouble concentrating 0  Moving slowly or  fidgety/restless 0  Suicidal thoughts 0  PHQ-9 Score 0  Difficult doing work/chores Not difficult at all     Vitals:   02/11/24 1312  BP: 117/67  Pulse: 89  SpO2: 96%    Medications Reviewed Today     Reviewed by Jaila Schellhorn M, RN (Registered Nurse) on 02/11/24 at 1312  Med List Status: <None>   Medication Order Taking? Sig Documenting Provider Last Dose Status Informant  Accu-Chek Softclix Lancets lancets [id number]  Use to prick finger to check blood glucose Shamleffer, Ibtehal Jaralla, MD  Active   aspirin  EC 81 MG tablet [id number] Yes Take 81 mg by mouth daily. [provider]  Active Self, Pharmacy Records  azelastine  (ASTELIN ) 0.1 % nasal spray [id number] Yes Place 2 sprays into both nostrils 2 (two) times daily. Cari Arlean HERO, FNP  Active   benzonatate  (TESSALON ) 200 MG capsule [id number] Yes Take 1 capsule (200 mg total) by mouth 3 (three) times daily as needed for cough. Kara Dorn NOVAK, MD  Active   Blood Glucose Monitoring Suppl (ACCU-CHEK GUIDE) w/Device KIT [id number]  Use to check blood glucose daily as directed Shamleffer, Ibtehal Jaralla, MD  Active   Blood Glucose Monitoring Suppl (ONETOUCH VERIO FLEX SYSTEM) w/Device PRESSLEY [id number]  USE TO TEST BLOOD SUGAR DAILY AS DIRECTED Shamleffer, Donell Cardinal, MD  Active Self, Pharmacy Records  Calcium  Carb-Cholecalciferol (CALCIUM  + VITAMIN D3 PO) [id number] Yes Take 1 tablet by mouth daily. [provider]  Active Self, Pharmacy Records  celecoxib  (CELEBREX ) 200 MG capsule [id number]  TAKE 1 CAPSULE BY MOUTH DAILY  Patient not taking: Reported on 02/11/2024   Gretta Bertrum ORN, PA-C  Active   cetirizine  (ZYRTEC ) 10 MG tablet [id number] Yes Take 0.5 tablets (5 mg total) by mouth daily. Cari Arlean HERO, FNP  Active   esomeprazole  (NEXIUM ) 40 MG capsule [id number] Yes Take 1 capsule (40 mg total) by mouth 2 (two) times daily before a meal. Jason Leita Repine, FNP  Active   fesoterodine  (TOVIAZ ) 4 MG TB24 tablet [id number] Yes Take 4 mg by mouth daily. [provider]  Active Self, Pharmacy Records  fluticasone   (FLONASE ) 50 MCG/ACT nasal spray [id number] Yes Place 2 sprays into both nostrils daily as needed for allergies or rhinitis. Cari Arlean HERO, FNP  Active   gabapentin  (NEURONTIN ) 100 MG capsule [id number] Yes Take 1 capsule (100 mg total) by mouth at bedtime.  Patient taking differently: Take 1 capsule (100 mg total) by mouth at bedtime.   Shamleffer, Donell Cardinal, MD  Active Self, Pharmacy Records           Med Note (COFFELL, JON HERO   Wed Jun 16, 2023 11:41 AM) Pt states she uses 2 strengths of gabapentin .  gabapentin  (NEURONTIN ) 300 MG capsule [id number]  Take 300 mg by mouth daily as needed (neuropathy).  Patient not taking: Reported on 02/11/2024   [provider]  Active Self, Pharmacy Records  glucose blood (ACCU-CHEK GUIDE TEST) test strip [id number]  Use to test blood glucose daily Shamleffer, Ibtehal Jaralla, MD  Active   glucose blood [facility] VERIO) test strip [id number]  USE TO CHECK BLOOD SUGAR TWO TIMES A DAY Shamleffer, Donell Cardinal, MD  Active   lidocaine  (LIDODERM ) 5 % [id number] Yes 1 patch daily. [provider]  Active   Misc Natural Products (MULLEIN GARLIC EAR DROPS OT) [id number]  Place in ear(s).  Patient not taking: Reported on 02/11/2024   [provider]  Active   montelukast  (SINGULAIR ) 10 MG tablet  [id number] Yes Take 1 tablet (10 mg total) by mouth at bedtime. Jeneal Danita Macintosh, MD  Active   Multiple Vitamins-Minerals (ZINC PO) [id number] Yes Take 1 tablet by mouth daily. [provider]  Active Self, Pharmacy Records  Pirfenidone  267 MG TABS [id number] Yes Take 2 tablets (534 mg total) by mouth in the morning, at noon, and at bedtime. **low dose as maintenanceTHORA Hope Almarie LELON, NP  Active   rosuvastatin  (CRESTOR ) 20 MG tablet [id number] Yes TAKE 1 TABLET BY MOUTH DAILY Walker, Caitlin S, NP  Active   Semaglutide ,0.25 or 0.5MG /DOS, (OZEMPIC , 0.25 OR 0.5 MG/DOSE,) 2 MG/3ML SOPN [id number] Yes 0.5 mg by Other route once a week.  Shamleffer, Ibtehal Jaralla, MD  Active   valsartan  (DIOVAN ) 160 MG tablet [id number] Yes Take 1 tablet (160 mg total) by mouth daily. Vannie Reche RAMAN, NP  Active           Recommendation:   Continue Current Plan of Care  Follow Up Plan:   Telephone follow up appointment date/time:  03/13/24 at 1:30 pm  Heddy Shutter, RN, MSN, BSN, CCM [place]  [facility], Population Health Case Manager Phone: [phone]

## 2024-02-11 NOTE — Patient Instructions (Signed)
 Visit Information  Thank you for taking time to visit with me today. Please don't hesitate to contact me if I can be of assistance to you before our next scheduled appointment.  Your next care management appointment is by telephone on 03/13/24 at 1:30 pm.  Please call the care guide team at [phone] if you need to cancel, schedule, or reschedule an appointment.   Please call the Suicide and Crisis Lifeline: 988 call the [place]  National Suicide Prevention Lifeline: 616 641 6035 or TTY: 4301598902 TTY 303-522-1163) to talk to a trained counselor call 1-800-273-TALK (toll free, 24 hour hotline) if you are experiencing a Mental Health or Behavioral Health Crisis or need someone to talk to.  Heddy Shutter, RN, MSN, BSN, CCM [place]  [facility], Population Health Case Manager Phone: [phone]

## 2024-02-15 ENCOUNTER — Encounter

## 2024-02-15 ENCOUNTER — Encounter (INDEPENDENT_AMBULATORY_CARE_PROVIDER_SITE_OTHER): Admitting: Internal Medicine

## 2024-02-15 DIAGNOSIS — Z006 Encounter for examination for normal comparison and control in clinical research program: Secondary | ICD-10-CM

## 2024-02-16 ENCOUNTER — Encounter: Payer: Self-pay | Admitting: Licensed Clinical Social Worker

## 2024-02-16 ENCOUNTER — Telehealth: Admitting: Licensed Clinical Social Worker

## 2024-02-17 ENCOUNTER — Other Ambulatory Visit: Payer: Self-pay

## 2024-02-21 ENCOUNTER — Telehealth: Payer: Self-pay

## 2024-02-21 NOTE — Progress Notes (Signed)
 Complex Care Management Care Guide Note  02/21/2024 Name: Amy Escobar MRN: [id number] DOB: [birth year]  Amy Escobar is a 78 y.o. year old female who is a primary care patient of Jason Leita Repine, FNP and is actively engaged with the care management team. I reached out to Ronal Charma Single by phone today to assist with re-scheduling  with the BSW.  Follow up plan: Telephone appointment with complex care management team member scheduled for:  02/23/24 at 3:00 p.m.   Dreama [facility]wood [facility]  [facility], [facility] VBCI Assistant Direct Dial: [phone]  Fax: [phone]

## 2024-02-22 NOTE — Progress Notes (Signed)
   She came for IPF pro registry expansion.  The visit is [place] no-show but this is not correct.  I signed consent after she consented.  Consent process was followed.  No exam for this visit.     SIGNATURE    Dr. Dorethia Cave, M.D., F.C.C.P, ACRP-CPI Pulmonary and Critical Care Medicine Research Investigator, [facility] @ [facility] Health Staff Physician, [facility] Center Director - Interstitial Lung Disease  Program  Pulmonary Fibrosis [facility] [place] Pulmonary and [facility] @ [facility] [place], [place], [address]   Pager: [phone], If no answer  OR between  19:00-7:00h: page [phone] Telephone (research): [phone]  6:21 PM 02/22/2024   6:21 PM 02/22/2024

## 2024-02-23 ENCOUNTER — Other Ambulatory Visit: Payer: Self-pay | Admitting: Licensed Clinical Social Worker

## 2024-02-23 NOTE — Patient Outreach (Signed)
 Complex Care Management   Visit Note  02/23/2024  Name:  Vasti Yagi MRN: [id number] DOB: [birth year]  Situation: Referral received for Complex Care Management related to SDOH Barriers:  none I obtained verbal consent from Patient.  Visit completed with Patient  on the phone  Background:   Past Medical History:  Diagnosis Date   Alkaline phosphatase deficiency    w/u Ne   Allergic rhinitis    Allergy     Anxiety    Arthritis    Cataract    BILATERAL-REMOVED   DM2 (diabetes mellitus, type 2) (HCC)    GERD (gastroesophageal reflux disease)    Gout    Hemorrhoids    Hyperlipidemia    Hypertension    Interstitial lung disease (HCC)    Mild pulmonary hypertension (HCC)    NSVT (nonsustained ventricular tachycardia) (HCC)    Osteoporosis    PVC's (premature ventricular contractions)    Thyroid  nodule    small   Tubular adenoma of colon 2017   Urticaria    Uterine fibroid     Assessment: SW completed the SDOH screening and the patient did not have any needs at this time   SDOH Interventions    Flowsheet Row Patient Outreach Telephone from 02/23/2024 in [place] POPULATION HEALTH DEPARTMENT Clinical Support from 12/29/2023 in [facility] Patient Outreach Telephone from 12/02/2023 in [place] POPULATION HEALTH DEPARTMENT Patient Outreach Telephone from 11/24/2023 in  POPULATION HEALTH DEPARTMENT Clinical Support from 12/28/2022 in [facility] Documentation from 07/31/2022 in [facility]  SDOH Interventions        Food Insecurity Interventions Intervention Not Indicated Intervention Not Indicated Intervention Not Indicated Intervention Not Indicated -- Intervention Not Indicated  Housing Interventions Intervention Not Indicated Intervention Not Indicated Intervention Not Indicated Intervention Not Indicated -- Intervention Not Indicated  Transportation  Interventions Intervention Not Indicated Intervention Not Indicated Intervention Not Indicated Intervention Not Indicated -- Intervention Not Indicated  Utilities Interventions Intervention Not Indicated Intervention Not Indicated Intervention Not Indicated Intervention Not Indicated -- Intervention Not Indicated  Alcohol Usage Interventions -- Intervention Not Indicated (Score <7) -- -- Intervention Not Indicated (Score <7) --  Depression Interventions/Treatment  -- PHQ2-9 Score <4 Follow-up Not Indicated -- -- -- --  Financial Strain Interventions Intervention Not Indicated Intervention Not Indicated -- -- Intervention Not Indicated --  Physical Activity Interventions -- Intervention Not Indicated -- -- Intervention Not Indicated --  Stress Interventions -- Intervention Not Indicated -- -- Intervention Not Indicated --  Social Connections Interventions -- Intervention Not Indicated -- -- Intervention Not Indicated --  Health Literacy Interventions -- Intervention Not Indicated -- -- -- --      Recommendation:   none  Follow Up Plan:   Closing From:  Complex Care Management  Tobias CHARM Maranda HEDWIG, PhD [facility], [facility] Social Worker Direct Dial: [phone]  Fax: [phone]

## 2024-02-23 NOTE — Patient Instructions (Signed)
 Visit Information  Thank you for taking time to visit with me today. Please don't hesitate to contact me if I can be of assistance to you before our next scheduled appointment.  Our next appointment is no further scheduled appointments.   Please call the care guide team at [phone] if you need to cancel or reschedule your appointment.   Following is a copy of your care plan:   Goals Addressed   None     Please call the Suicide and Crisis Lifeline: 988 go to [facility] Urgent [facility] [address] [place] [phone]) call 911 if you are experiencing a Mental Health or Behavioral Health Crisis or need someone to talk to.  Patient verbalizes understanding of instructions and care plan provided today and agrees to view in MyChart. Active MyChart status and patient understanding of how to access instructions and care plan via MyChart confirmed with patient.     Amy CHARM Maranda HEDWIG, PhD [facility], [facility] Social Worker Direct Dial: [phone]  Fax: [phone]

## 2024-02-24 ENCOUNTER — Other Ambulatory Visit (HOSPITAL_BASED_OUTPATIENT_CLINIC_OR_DEPARTMENT_OTHER): Payer: Self-pay

## 2024-02-24 ENCOUNTER — Other Ambulatory Visit (HOSPITAL_COMMUNITY): Payer: Self-pay

## 2024-02-25 ENCOUNTER — Encounter: Payer: Self-pay | Admitting: Allergy

## 2024-02-25 ENCOUNTER — Ambulatory Visit: Admitting: Allergy

## 2024-02-25 VITALS — BP 114/68 | HR 93 | Temp 97.9°F | Resp 18 | Ht 64.0 in | Wt 155.8 lb

## 2024-02-25 DIAGNOSIS — J029 Acute pharyngitis, unspecified: Secondary | ICD-10-CM | POA: Diagnosis not present

## 2024-02-25 DIAGNOSIS — H1013 Acute atopic conjunctivitis, bilateral: Secondary | ICD-10-CM | POA: Diagnosis not present

## 2024-02-25 DIAGNOSIS — J302 Other seasonal allergic rhinitis: Secondary | ICD-10-CM

## 2024-02-25 DIAGNOSIS — R0789 Other chest pain: Secondary | ICD-10-CM

## 2024-02-25 DIAGNOSIS — J849 Interstitial pulmonary disease, unspecified: Secondary | ICD-10-CM

## 2024-02-25 DIAGNOSIS — J3089 Other allergic rhinitis: Secondary | ICD-10-CM

## 2024-02-25 MED ORDER — AMOXICILLIN-POT CLAVULANATE 875-125 MG PO TABS: 1.0000 | ORAL_TABLET | Freq: Two times a day (BID) | ORAL | 0 refills | Status: AC

## 2024-02-25 NOTE — Progress Notes (Signed)
 Follow-up Note  RE: Amy Escobar MRN: [id number] DOB: 1946-05-07 Date of Office Visit: 02/25/2024   History of present illness: Amy Escobar is a 78 y.o. female presenting today for follow-up of allergic rhinitis with conjunctivitis.  She also has history of ILD followed by Dr Geronimo.  She was last sen in the office on 12/23/23 by myself.   Discussed the use of AI scribe software for clinical note transcription with the patient, who gave verbal consent to proceed.  She has been experiencing a sore throat for the past week, described as centrally located in her throat (not unilateral).  She reports increased nasal congestion and phlegm production.  She has been using a home remedy of baking soda and warm water gargles, which has provided some relief. She consumes Mullins tea, ginger, and thyme concoctions twice daily and uses chlorophyll drops in her water, believing it helps with her overall health. She has not used her nasal sprays recently.  No fevers, sweats, or chills.  She has a long history of arthritis and mentions having pain in her knees and chest. She uses a medical patch on her back for pain relief.  However she is noticing more left-sided chest pain, underneath the breast.  She received gel shots in her knees a week before last, which usually do not cause problems, but she is currently experiencing knee pain, attributing this to her arthritis.  She notes that her building's vents were recently cleaned, resulting in colder air in her apartment. She has been living in the building for eleven years and this is the first time the vents have been cleaned. She continues to follow with pulmonology for her ILD and is on pirfenidone .  She will use supplemental oxygen  when needed.  Review of systems: 10pt ROS negative unless noted above in HPI   Past medical/social/surgical/family history have been reviewed and are unchanged unless specifically indicated below.  No  changes  Medication List: Current Outpatient Medications  Medication Sig Dispense Refill   Accu-Chek Softclix Lancets lancets Use to prick finger to check blood glucose 100 each 12   amoxicillin -clavulanate (AUGMENTIN ) 875-125 MG tablet Take 1 tablet by mouth 2 (two) times daily for 10 days. 20 tablet 0   aspirin  EC 81 MG tablet Take 81 mg by mouth daily.     azelastine  (ASTELIN ) 0.1 % nasal spray Place 2 sprays into both nostrils 2 (two) times daily. 30 mL 5   benzonatate  (TESSALON ) 200 MG capsule Take 1 capsule (200 mg total) by mouth 3 (three) times daily as needed for cough. 30 capsule 1   Blood Glucose Monitoring Suppl (ACCU-CHEK GUIDE) w/Device KIT Use to check blood glucose daily as directed 1 kit 0   Blood Glucose Monitoring Suppl (ONETOUCH VERIO FLEX SYSTEM) w/Device KIT USE TO TEST BLOOD SUGAR DAILY AS DIRECTED 1 kit 0   Calcium  Carb-Cholecalciferol (CALCIUM  + VITAMIN D3 PO) Take 1 tablet by mouth daily.     cetirizine  (ZYRTEC ) 10 MG tablet Take 0.5 tablets (5 mg total) by mouth daily. 30 tablet 5   esomeprazole  (NEXIUM ) 40 MG capsule Take 1 capsule (40 mg total) by mouth 2 (two) times daily before a meal. 180 capsule 1   fesoterodine  (TOVIAZ ) 4 MG TB24 tablet Take 4 mg by mouth daily.     fluticasone  (FLONASE ) 50 MCG/ACT nasal spray Place 2 sprays into both nostrils daily as needed for allergies or rhinitis. 16 g 5   gabapentin  (NEURONTIN ) 100 MG capsule Take 1  capsule (100 mg total) by mouth at bedtime. (Patient taking differently: Take 1 capsule (100 mg total) by mouth at bedtime.) 90 capsule 3   glucose blood (ACCU-CHEK GUIDE TEST) test strip Use to test blood glucose daily 300 each 12   glucose blood (ONETOUCH VERIO) test strip USE TO CHECK BLOOD SUGAR TWO TIMES A DAY 100 strip 3   lidocaine  (LIDODERM ) 5 % 1 patch daily.     montelukast  (SINGULAIR ) 10 MG tablet Take 1 tablet (10 mg total) by mouth at bedtime. 30 tablet 5   Multiple Vitamins-Minerals (ZINC PO) Take 1 tablet by  mouth daily.     Pirfenidone  267 MG TABS Take 2 tablets (534 mg total) by mouth in the morning, at noon, and at bedtime. **low dose as maintenance** 540 tablet 0   rosuvastatin  (CRESTOR ) 20 MG tablet TAKE 1 TABLET BY MOUTH DAILY 90 tablet 2   Semaglutide ,0.25 or 0.5MG /DOS, (OZEMPIC , 0.25 OR 0.5 MG/DOSE,) 2 MG/3ML SOPN 0.5 mg by Other route once a week. 9 mL 1   valsartan  (DIOVAN ) 160 MG tablet Take 1 tablet (160 mg total) by mouth daily. 90 tablet 2   celecoxib  (CELEBREX ) 200 MG capsule TAKE 1 CAPSULE BY MOUTH DAILY (Patient not taking: Reported on 02/11/2024) 30 capsule 1   gabapentin  (NEURONTIN ) 300 MG capsule Take 300 mg by mouth daily as needed (neuropathy). (Patient not taking: Reported on 02/25/2024)     Misc Natural Products (MULLEIN GARLIC EAR DROPS OT) Place in ear(s). (Patient not taking: Reported on 02/25/2024)     No current facility-administered medications for this visit.     Known medication allergies: Allergies  Allergen Reactions   Benicar [Olmesartan] Other (See Comments)    Headache     Physical examination: Blood pressure 114/68, pulse 93, temperature 97.9 F (36.6 C), temperature source Temporal, resp. rate 18, height 5' 4 (1.626 m), weight 155 lb 12.8 oz (70.7 kg), SpO2 95%.  General: Alert, interactive, in no acute distress. HEENT: PERRLA, TMs pearly gray, turbinates minimally edematous with clear discharge, post-pharynx moderately erythematous without exudate. Neck: Supple without lymphadenopathy. Lungs: Clear to auscultation without wheezing, rhonchi or rales. {no increased work of breathing. CV: Normal S1, S2 without murmurs. Abdomen: Nondistended, nontender. Skin: Warm and dry, without lesions or rashes. Extremities:  No clubbing, cyanosis or edema. Neuro:   Grossly intact.  Diagnostics/Labs: None today  Assessment and plan: Sore throat, chest and knee pain Concerned for infectious process with week long of symptoms - Take Augmentin  1 tab twice a day  for next 10 days.  Take with food.   - Continue teas and gargling to help soothe sore throat  Allergic Rhinitis with Conjunctivitis Continue avoidance measures for dust mites, molds, cockroach, dog dander, tree pollen, grass pollen, weed pollen.  -Continue Montelukast  10 mg daily at bedtime.  -Continue Xyzal  5 mg daily (take whole tab daily) -Use Astelin  nasal spray 2 sprays up to 2 times a day for nasal drainage/post-nasal drip.  Use this for next week to help decrease post-nasal drip that can worsen sore throat. -Use Flonase  2 sprays each nostril daily for 1-2 weeks at a time before stopping once nasal congestion improves for maximum benefit. With using nasal sprays point tip of bottle toward eye on same side nostril and lean head slightly forward for best technique.   -Continue Pataday  eye drops 1 drop each eye daily as needed for itchy watery eyes.  Interstitial Lung Disease Under the care of Dr. Geronimo. On low-dose Pirfenidone  (Esbriet ). -Continue  follow-up with Dr. Bubba for management of interstitial lung disease.  Follow-up in 4-6 months  I appreciate the opportunity to take part in [facility] care. Please do not hesitate to contact me with questions.  Sincerely,   Danita Brain, MD Allergy /Immunology [facility]

## 2024-02-25 NOTE — Patient Instructions (Addendum)
 Sore throat, chest and knee pain Concerned for infectious process with week long of symptoms - Take Augmentin  1 tab twice a day for next 10 days.  Take with food.   - Continue teas and gargling to help soothe sore throat  Allergic Rhinitis with Conjunctivitis Continue avoidance measures for dust mites, molds, cockroach, dog dander, tree pollen, grass pollen, weed pollen.  -Continue Montelukast  10 mg daily at bedtime.  -Continue Xyzal  5 mg daily (take whole tab daily) -Use Astelin  nasal spray 2 sprays up to 2 times a day for nasal drainage/post-nasal drip.  Use this for next week to help decrease post-nasal drip that can worsen sore throat. -Use Flonase  2 sprays each nostril daily for 1-2 weeks at a time before stopping once nasal congestion improves for maximum benefit. With using nasal sprays point tip of bottle toward eye on same side nostril and lean head slightly forward for best technique.   -Continue Pataday  eye drops 1 drop each eye daily as needed for itchy watery eyes.  Interstitial Lung Disease Under the care of Dr. Geronimo. On low-dose Pirfenidone  (Esbriet ). -Continue follow-up with Dr. Bubba for management of interstitial lung disease.  Follow-up in 4-6 months

## 2024-03-03 ENCOUNTER — Ambulatory Visit: Payer: Self-pay | Admitting: Internal Medicine

## 2024-03-06 ENCOUNTER — Other Ambulatory Visit: Payer: Self-pay

## 2024-03-06 ENCOUNTER — Other Ambulatory Visit: Payer: Self-pay | Admitting: Primary Care

## 2024-03-06 DIAGNOSIS — J84112 Idiopathic pulmonary fibrosis: Secondary | ICD-10-CM

## 2024-03-06 NOTE — Progress Notes (Signed)
 Specialty Pharmacy Refill Coordination Note  Amy Escobar is a 78 y.o. female contacted today regarding refills of specialty medication(s) Pirfenidone    Patient requested Delivery   Delivery date: 03/13/24   Verified address: [address]  [place] [address]   Medication will be filled on 03/10/24.   This fill date is pending response to refill request from provider. Patient is aware and if they have not received fill by intended date they must follow up with pharmacy.

## 2024-03-07 ENCOUNTER — Ambulatory Visit: Admitting: Family

## 2024-03-07 ENCOUNTER — Encounter: Payer: Self-pay | Admitting: Family

## 2024-03-07 ENCOUNTER — Ambulatory Visit: Payer: Self-pay | Admitting: Family

## 2024-03-07 ENCOUNTER — Other Ambulatory Visit: Payer: Self-pay

## 2024-03-07 VITALS — BP 124/82 | HR 95 | Ht 64.0 in | Wt 158.0 lb

## 2024-03-07 DIAGNOSIS — R5383 Other fatigue: Secondary | ICD-10-CM

## 2024-03-07 DIAGNOSIS — R7989 Other specified abnormal findings of blood chemistry: Secondary | ICD-10-CM

## 2024-03-07 DIAGNOSIS — Z23 Encounter for immunization: Secondary | ICD-10-CM

## 2024-03-07 LAB — VITAMIN B12: Vitamin B-12: 649 pg/mL (ref 211–911)

## 2024-03-07 LAB — CBC WITH DIFFERENTIAL/PLATELET
Basophils Absolute: 0 K/uL (ref 0.0–0.1)
Basophils Relative: 0.5 % (ref 0.0–3.0)
Eosinophils Absolute: 0.2 K/uL (ref 0.0–0.7)
Eosinophils Relative: 2.4 % (ref 0.0–5.0)
HCT: 40.6 % (ref 36.0–46.0)
Hemoglobin: 13.2 g/dL (ref 12.0–15.0)
Lymphocytes Relative: 29.5 % (ref 12.0–46.0)
Lymphs Abs: 2.6 K/uL (ref 0.7–4.0)
MCHC: 32.5 g/dL (ref 30.0–36.0)
MCV: 84.9 fl (ref 78.0–100.0)
Monocytes Absolute: 0.9 K/uL (ref 0.1–1.0)
Monocytes Relative: 9.8 % (ref 3.0–12.0)
Neutro Abs: 5 K/uL (ref 1.4–7.7)
Neutrophils Relative %: 57.8 % (ref 43.0–77.0)
Platelets: 197 K/uL (ref 150.0–400.0)
RBC: 4.79 Mil/uL (ref 3.87–5.11)
RDW: 14.7 % (ref 11.5–15.5)
WBC: 8.6 K/uL (ref 4.0–10.5)

## 2024-03-07 LAB — COMPREHENSIVE METABOLIC PANEL WITH GFR
ALT: 27 U/L (ref 0–35)
AST: 29 U/L (ref 0–37)
Albumin: 4.1 g/dL (ref 3.5–5.2)
Alkaline Phosphatase: 95 U/L (ref 39–117)
BUN: 11 mg/dL (ref 6–23)
CO2: 30 meq/L (ref 19–32)
Calcium: 9.5 mg/dL (ref 8.4–10.5)
Chloride: 103 meq/L (ref 96–112)
Creatinine, Ser: 0.83 mg/dL (ref 0.40–1.20)
GFR: 67.43 mL/min (ref >60.00)
Glucose, Bld: 78 mg/dL (ref 70–99)
Potassium: 4 meq/L (ref 3.5–5.1)
Sodium: 140 meq/L (ref 135–145)
Total Bilirubin: 0.3 mg/dL (ref 0.2–1.2)
Total Protein: 7.4 g/dL (ref 6.0–8.3)

## 2024-03-07 MED ORDER — PIRFENIDONE 267 MG PO TABS
534.0000 mg | ORAL_TABLET | Freq: Three times a day (TID) | ORAL | 0 refills | Status: DC
  Filled 2024-03-07: qty 180, 30d supply, fill #0
  Filled 2024-04-04 – 2024-06-26 (×2): qty 180, 30d supply, fill #1
  Filled 2024-07-21: qty 180, 30d supply, fill #2

## 2024-03-07 NOTE — Patient Instructions (Signed)
 Please call Amy Escobar to get set up with a new PCP;  Please talk to your urologist about the vaginal pain- they may want to adjust your medications.

## 2024-03-07 NOTE — Progress Notes (Signed)
 Amy Escobar is a 78 y.o. female with the following history as recorded in EpicCare:  Patient Active Problem List   Diagnosis Date Noted   Allergic conjunctivitis of both eyes 09/23/2023   Interstitial lung disease (HCC) 06/17/2023   Acute respiratory failure with hypoxia (HCC) 06/15/2023   Polyneuropathy associated with underlying disease (HCC) 08/31/2022   Type 2 diabetes mellitus with diabetic polyneuropathy, without long-term current use of insulin  (HCC) 06/18/2021   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 without macular edema, without long-term current use of insulin  (HCC) 06/18/2021   Diabetes mellitus (HCC) 06/18/2021   Osteoarthritis of right AC (acromioclavicular) joint 03/06/2021   Osteoarthritis of right glenohumeral joint 03/06/2021   Foot pain, bilateral 12/26/2020   Diabetic neuropathy (HCC) 01/10/2020   Hav (hallux abducto valgus), unspecified laterality 01/10/2020   Chronic arthropathy 01/10/2020   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 edema associated with type 2 diabetes mellitus (HCC) 12/26/2019   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   AC (acromioclavicular) arthritis 08/02/2019   Unspecified inflammatory spondylopathy, 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   Claudication (HCC) 04/19/2019   Morbid obesity (HCC) 04/19/2019   Suspected COVID-19 virus infection 04/13/2019   Upper airway cough syndrome 08/22/2018   Greater trochanteric bursitis of both hips 06/15/2018   Trigger point of shoulder region, left 02/07/2018   Hypertension    Mild pulmonary hypertension (HCC)    NSVT (nonsustained ventricular tachycardia) (HCC)    PVC's (premature ventricular contractions)    URI (upper respiratory infection) 08/09/2016   Neck pain 02/10/2016   Urinary incontinence 02/10/2016   Degenerative arthritis of knee, bilateral 07/17/2015   Knee MCL sprain 06/07/2015   Discomfort in  chest 02/15/2015   Chest pain 02/15/2015   DM type 2, controlled, with complication (HCC)    Wellness examination 08/06/2014   Acute meniscal tear of knee 02/13/2014   Primary localized osteoarthrosis, lower leg 02/13/2014   Gastrocnemius tear 12/25/2013   UTI (urinary tract infection) 12/20/2013   Right knee pain 12/20/2013   Pulmonary hypertension (HCC) 10/06/2013   DOE (dyspnea on exertion) 08/04/2013   Dysphagia 08/10/2012   Hip pain, left 02/02/2012   Painful respiration 05/25/2011   Encounter for long-term (current) use of other medications 01/30/2011   PELVIC PAIN, LEFT 07/30/2010   TOBACCO USE, QUIT 07/07/2010   FATIGUE 12/20/2009   Headache 12/20/2009   Low back pain 12/26/2008   Disorder of liver 04/13/2008   FOOT PAIN, BILATERAL 04/13/2008   FIBROIDS, UTERUS 11/24/2007   THYROID  NODULE, LEFT 11/24/2007   HYPERCHOLESTEROLEMIA 11/24/2007   CARPAL TUNNEL SYNDROME, BILATERAL 11/24/2007   ALKALINE PHOSPHATASE, ELEVATED 11/24/2007   Gout 08/10/2007   ANXIETY 08/10/2007   Essential hypertension 08/10/2007   Cough 08/01/2007   Seasonal and perennial allergic rhinitis 01/14/2007   GERD 01/14/2007   Osteoporosis 01/14/2007    Current Outpatient Medications  Medication Sig Dispense Refill   Accu-Chek Softclix Lancets lancets Use to prick finger to check blood glucose 100 each 12   aspirin  EC 81 MG tablet Take 81 mg by mouth daily.     azelastine  (ASTELIN ) 0.1 % nasal spray Place 2 sprays into both nostrils 2 (two) times daily. 30 mL 5   benzonatate  (TESSALON ) 200 MG capsule Take 1 capsule (200 mg total) by mouth 3 (three) times daily as needed for cough. 30 capsule 1   Blood Glucose Monitoring Suppl (ACCU-CHEK GUIDE) w/Device KIT Use to check blood glucose daily as directed  1 kit 0   Blood Glucose Monitoring Suppl (ONETOUCH VERIO FLEX SYSTEM) w/Device KIT USE TO TEST BLOOD SUGAR DAILY AS DIRECTED 1 kit 0   Calcium  Carb-Cholecalciferol (CALCIUM  + VITAMIN D3 PO) Take 1 tablet  by mouth daily.     cephALEXin  (KEFLEX ) 250 MG capsule Take 250 mg by mouth daily.     cetirizine  (ZYRTEC ) 10 MG tablet Take 0.5 tablets (5 mg total) by mouth daily. 30 tablet 5   esomeprazole  (NEXIUM ) 40 MG capsule Take 1 capsule (40 mg total) by mouth 2 (two) times daily before a meal. 180 capsule 1   fesoterodine  (TOVIAZ ) 4 MG TB24 tablet Take 4 mg by mouth daily.     fluticasone  (FLONASE ) 50 MCG/ACT nasal spray Place 2 sprays into both nostrils daily as needed for allergies or rhinitis. 16 g 5   gabapentin  (NEURONTIN ) 100 MG capsule Take 1 capsule (100 mg total) by mouth at bedtime. (Patient taking differently: Take 1 capsule (100 mg total) by mouth at bedtime.) 90 capsule 3   gabapentin  (NEURONTIN ) 300 MG capsule Take 300 mg by mouth daily as needed (neuropathy).     glucose blood (ACCU-CHEK GUIDE TEST) test strip Use to test blood glucose daily 300 each 12   glucose blood (ONETOUCH VERIO) test strip USE TO CHECK BLOOD SUGAR TWO TIMES A DAY 100 strip 3   lidocaine  (LIDODERM ) 5 % 1 patch daily.     montelukast  (SINGULAIR ) 10 MG tablet Take 1 tablet (10 mg total) by mouth at bedtime. 30 tablet 5   Multiple Vitamins-Minerals (ZINC PO) Take 1 tablet by mouth daily.     rosuvastatin  (CRESTOR ) 20 MG tablet TAKE 1 TABLET BY MOUTH DAILY 90 tablet 2   Semaglutide ,0.25 or 0.5MG /DOS, (OZEMPIC , 0.25 OR 0.5 MG/DOSE,) 2 MG/3ML SOPN 0.5 mg by Other route once a week. 9 mL 1   valsartan  (DIOVAN ) 160 MG tablet Take 1 tablet (160 mg total) by mouth daily. 90 tablet 2   celecoxib  (CELEBREX ) 200 MG capsule TAKE 1 CAPSULE BY MOUTH DAILY (Patient not taking: Reported on 03/07/2024) 30 capsule 1   Pirfenidone  267 MG TABS Take 2 tablets (534 mg total) by mouth in the morning, at noon, and at bedtime. **low dose as maintenance** 540 tablet 0   No current facility-administered medications for this visit.    Allergies: Benicar [olmesartan]  Past Medical History:  Diagnosis Date   Alkaline phosphatase deficiency     w/u Ne   Allergic rhinitis    Allergy     Anxiety    Arthritis    Cataract    BILATERAL-REMOVED   DM2 (diabetes mellitus, type 2) (HCC)    GERD (gastroesophageal reflux disease)    Gout    Hemorrhoids    Hyperlipidemia    Hypertension    Interstitial lung disease (HCC)    Mild pulmonary hypertension (HCC)    NSVT (nonsustained ventricular tachycardia) (HCC)    Osteoporosis    PVC's (premature ventricular contractions)    Thyroid  nodule    small   Tubular adenoma of colon 2017   Urticaria    Uterine fibroid     Past Surgical History:  Procedure Laterality Date   CATARACT EXTRACTION Bilateral 2016   COLONOSCOPY  2007   echocardiogram (other)  01/16/2002   POLYPECTOMY     removed tumors from foot nerves  04/1999   stress cardiolite   02/12/2006   TOENAIL EXCISION     TUBAL LIGATION     TYMPANOSTOMY TUBE PLACEMENT  Family History  Problem Relation Age of Onset   Heart disease Mother    Allergic rhinitis Mother    Heart disease Father    Stomach cancer Maternal Grandmother    Heart disease Maternal Grandfather    Rectal cancer Maternal Grandfather    Colon cancer Neg Hx    Breast cancer Neg Hx    Colon polyps Neg Hx    Esophageal cancer Neg Hx     Social History   Tobacco Use   Smoking status: Former    Current packs/day: 0.00    Average packs/day: 0.3 packs/day for 5.0 years (1.3 ttl pk-yrs)    Types: Cigarettes    Start date: 06/30/1963    Quit date: 06/29/1968    Years since quitting: 55.7   Smokeless tobacco: Never  Substance Use Topics   Alcohol use: No    Alcohol/week: 0.0 standard drinks of alcohol    Subjective:   Presents with concerns for vaginal dryness;  is under care of urology but has not spoken to him yet;  Would like to get her flu shot updated; Asking to get her liver functions re-checked today; Will be seeing endocrinology and pulmonology later this week;     Objective:  Vitals:   03/07/24 0857  BP: 124/82  Pulse: 95  SpO2: 97%   Weight: 158 lb (71.7 kg)  Height: 5' 4 (1.626 m)    General: Well developed, well nourished, in no acute distress  Skin : Warm and dry.  Head: Normocephalic and atraumatic  Lungs: Respirations unlabored; clear to auscultation bilaterally without wheeze, rales, rhonchi  CVS exam: normal rate and regular rhythm.  Neurologic: Alert and oriented; speech intact; face symmetrical; moves all extremities well; CNII-XII intact without focal deficit   Assessment:  1. Other fatigue   2. Low vitamin B12 level     Plan:  Check CBC, CMP, B12 level today;  Flu shot given;  Discussed need to establish with new PCP/ discussing vaginal dryness with her urologist.   No follow-ups on file.  Orders Placed This Encounter  Procedures   Comp Met (CMET)   CBC with Differential/Platelet   B12    Requested Prescriptions    No prescriptions requested or ordered in this encounter

## 2024-03-07 NOTE — Addendum Note (Signed)
 Addended by: ORVIN [name] HERO on: 03/07/2024 10:34 AM   Modules accepted: Orders

## 2024-03-08 ENCOUNTER — Telehealth: Payer: Self-pay

## 2024-03-08 ENCOUNTER — Encounter: Payer: Self-pay | Admitting: Internal Medicine

## 2024-03-08 ENCOUNTER — Ambulatory Visit (INDEPENDENT_AMBULATORY_CARE_PROVIDER_SITE_OTHER): Admitting: Internal Medicine

## 2024-03-08 VITALS — BP 122/80 | HR 94 | Ht 64.0 in | Wt 158.0 lb

## 2024-03-08 DIAGNOSIS — Z7984 Long term (current) use of oral hypoglycemic drugs: Secondary | ICD-10-CM | POA: Diagnosis not present

## 2024-03-08 DIAGNOSIS — E1142 Type 2 diabetes mellitus with diabetic polyneuropathy: Secondary | ICD-10-CM | POA: Diagnosis not present

## 2024-03-08 LAB — POCT GLYCOSYLATED HEMOGLOBIN (HGB A1C): Hemoglobin A1C: 5.8 % — AB (ref 4.0–5.6)

## 2024-03-08 MED ORDER — OZEMPIC (0.25 OR 0.5 MG/DOSE) 2 MG/3ML ~~LOC~~ SOPN: 0.5000 mg | PEN_INJECTOR | SUBCUTANEOUS | 3 refills | Status: AC

## 2024-03-08 NOTE — Telephone Encounter (Signed)
 Received signed order from Dr. Geronimo. Faxed back to [employer] at [phone] Received confirmation, NFN

## 2024-03-08 NOTE — Patient Instructions (Signed)
Continue Ozempic 0.5 mg once weekly       HOW TO TREAT LOW BLOOD SUGARS (Blood sugar LESS THAN 70 MG/DL) Please follow the RULE OF 15 for the treatment of hypoglycemia treatment (when your (blood sugars are less than 70 mg/dL)   STEP 1: Take 15 grams of carbohydrates when your blood sugar is low, which includes:  3-4 GLUCOSE TABS  OR 3-4 OZ OF JUICE OR REGULAR SODA OR ONE TUBE OF GLUCOSE GEL    STEP 2: RECHECK blood sugar in 15 MINUTES STEP 3: If your blood sugar is still low at the 15 minute recheck --> then, go back to STEP 1 and treat AGAIN with another 15 grams of carbohydrates.

## 2024-03-08 NOTE — Progress Notes (Signed)
 Name: Amy Escobar  Age/ Sex: 78 y.o., female   MRN/ DOB: [id number], 1945/08/22     PCP: Jason Leita Repine, FNP   Reason for Endocrinology Evaluation: Type 2 Diabetes Mellitus  Initial Endocrine Consultative Visit: 11/25/2012    PATIENT IDENTIFIER: [name] is a 78 y.o. female with a past medical history of T2DM, HTN, non-obstructive CAD , and Dyslipidemia . The patient has followed with Endocrinology clinic since 11/25/2012 for consultative assistance with management of her diabetes.  DIABETIC HISTORY:  [name] was diagnosed with DM years ago, She has been on Metformin  . Her hemoglobin A1c has ranged from 5.6% in 2021, peaking at 7.2% in 2015.  Invokana  caused genital infections   Transitioned care from Dr. Kassie 05/2021  She was started on glimepiride  in September 2023 due to hyperglycemia, but this was discontinued as it caused weight gain Started on Ozempic  03/2022    SUBJECTIVE:   During the last visit (09/07/2023): A1c 5.8%     Today (03/08/2024): [name]  is here for a follow up on diabetes management. She checks her blood sugars 1 times daily. The patient has not had hypoglycemic episodes since the last clinic visit.   Patient follows with pulmonary for interstitial lung disease, she is on Pirfenidone  She had a recent follow-up with her PCP for fatigue and low vitamin B12 levels  Patient also follows with [facility] for allergic rhinitis with conjunctivitis Continues with  tingling of  left foot, sees podiatry ( Dr. Norleen Binder )  Weight has been stable Currently on Abx for UTI  Stable SOB  Constipation has resolved  NO nausea     HOME DIABETES REGIMEN:  Ozempic  0.5 mg weekly      Statin: yes ACE-I/ARB: yes    METER DOWNLOAD SUMMARY: 109-135 mg/dL    DIABETIC COMPLICATIONS: Microvascular complications:  Peripheral Neuropathy , moderate nonproliferative DR Denies: CKD  Last Eye Exam: Completed  09/14/2023  Macrovascular complications:  Non-Obstructive CAD - follows with Cardiology  Denies: CVA, PVD   HISTORY:  Past Medical History:  Past Medical History:  Diagnosis Date   Alkaline phosphatase deficiency    w/u Ne   Allergic rhinitis    Allergy     Anxiety    Arthritis    Cataract    BILATERAL-REMOVED   DM2 (diabetes mellitus, type 2) (HCC)    GERD (gastroesophageal reflux disease)    Gout    Hemorrhoids    Hyperlipidemia    Hypertension    Interstitial lung disease (HCC)    Mild pulmonary hypertension (HCC)    NSVT (nonsustained ventricular tachycardia) (HCC)    Osteoporosis    PVC's (premature ventricular contractions)    Thyroid  nodule    small   Tubular adenoma of colon 2017   Urticaria    Uterine fibroid    Past Surgical History:  Past Surgical History:  Procedure Laterality Date   CATARACT EXTRACTION Bilateral 2016   COLONOSCOPY  2007   echocardiogram (other)  01/16/2002   POLYPECTOMY     removed tumors from foot nerves  04/1999   stress cardiolite   02/12/2006   TOENAIL EXCISION     TUBAL LIGATION     TYMPANOSTOMY TUBE PLACEMENT     Social History:  reports that she quit smoking about 55 years ago. Her smoking use included cigarettes. She started smoking about 60 years ago. She has a 1.3 pack-year smoking history. She has never used smokeless tobacco. She reports  that she does not drink alcohol and does not use drugs. Family History:  Family History  Problem Relation Age of Onset   Heart disease Mother    Allergic rhinitis Mother    Heart disease Father    Stomach cancer Maternal Grandmother    Heart disease Maternal Grandfather    Rectal cancer Maternal Grandfather    Colon cancer Neg Hx    Breast cancer Neg Hx    Colon polyps Neg Hx    Esophageal cancer Neg Hx      HOME MEDICATIONS: Allergies as of 03/08/2024       Reactions   Benicar [olmesartan] Other (See Comments)   Headache         Medication List        Accurate as of  March 08, 2024 10:55 AM. If you have any questions, ask your nurse or doctor.          Accu-Chek Softclix Lancets lancets Use to prick finger to check blood glucose   aspirin  EC 81 MG tablet Take 81 mg by mouth daily.   azelastine  0.1 % nasal spray Commonly known as: ASTELIN  Place 2 sprays into both nostrils 2 (two) times daily.   benzonatate  200 MG capsule Commonly known as: TESSALON  Take 1 capsule (200 mg total) by mouth 3 (three) times daily as needed for cough.   CALCIUM  + VITAMIN D3 PO Take 1 tablet by mouth daily.   celecoxib  200 MG capsule Commonly known as: CELEBREX  TAKE 1 CAPSULE BY MOUTH DAILY   cephALEXin  250 MG capsule Commonly known as: KEFLEX  Take 250 mg by mouth daily.   cetirizine  10 MG tablet Commonly known as: ZYRTEC  Take 0.5 tablets (5 mg total) by mouth daily.   esomeprazole  40 MG capsule Commonly known as: NEXIUM  Take 1 capsule (40 mg total) by mouth 2 (two) times daily before a meal.   fesoterodine  4 MG Tb24 tablet Commonly known as: TOVIAZ  Take 4 mg by mouth daily.   fluticasone  50 MCG/ACT nasal spray Commonly known as: FLONASE  Place 2 sprays into both nostrils daily as needed for allergies or rhinitis.   gabapentin  300 MG capsule Commonly known as: NEURONTIN  Take 300 mg by mouth daily as needed (neuropathy).   gabapentin  100 MG capsule Commonly known as: NEURONTIN  Take 1 capsule (100 mg total) by mouth at bedtime.   lidocaine  5 % Commonly known as: LIDODERM  1 patch daily.   montelukast  10 MG tablet Commonly known as: Singulair  Take 1 tablet (10 mg total) by mouth at bedtime.   OneTouch Verio Flex System w/Device Kit USE TO TEST BLOOD SUGAR DAILY AS DIRECTED   Accu-Chek Guide w/Device Kit Use to check blood glucose daily as directed   OneTouch Verio test strip Generic drug: glucose blood USE TO CHECK BLOOD SUGAR TWO TIMES A DAY   Accu-Chek Guide Test test strip Generic drug: glucose blood Use to test blood glucose  daily   Ozempic  (0.25 or 0.5 MG/DOSE) 2 MG/3ML Sopn Generic drug: Semaglutide (0.25 or 0.5MG /DOS) 0.5 mg by Other route once a week.   Pirfenidone  267 MG Tabs Take 2 tablets (534 mg total) by mouth in the morning, at noon, and at bedtime. **low dose as maintenance**   rosuvastatin  20 MG tablet Commonly known as: CRESTOR  TAKE 1 TABLET BY MOUTH DAILY   valsartan  160 MG tablet Commonly known as: DIOVAN  Take 1 tablet (160 mg total) by mouth daily.   ZINC PO Take 1 tablet by mouth daily.  OBJECTIVE:   Vital Signs: BP 122/80 (BP Location: Left Arm, Patient Position: Sitting, Cuff Size: Normal)   Pulse 94   Ht 5' 4 (1.626 m)   Wt 158 lb (71.7 kg)   SpO2 97%   BMI 27.12 kg/m   Wt Readings from Last 3 Encounters:  03/08/24 158 lb (71.7 kg)  03/07/24 158 lb (71.7 kg)  02/25/24 155 lb 12.8 oz (70.7 kg)     Exam: General: Pt appears well and is in NAD  Neck: General: Supple without adenopathy. Thyroid : Thyroid  size normal.  No goiter or nodules appreciated.  Lungs: Clear with good BS bilat with no rales, rhonchi, or wheezes  Heart: RRR with normal S1 and S2 and no gallops; no murmurs; no rub  Extremities: No pretibial edema.   Neuro: MS is good with appropriate affect, pt is alert and Ox3    DM foot exam: 03/08/2024  The skin of the feet is intact without sores or ulcerations. The pedal pulses are 2+ on right and 2+ on left. The sensation is intact to a screening 5.07, 10 gram monofilament bilaterally        DATA REVIEWED:  Lab Results  Component Value Date   HGBA1C 5.8 (A) 03/08/2024   HGBA1C 5.8 (A) 09/07/2023   HGBA1C 5.9 (A) 03/08/2023     Latest Reference Range & Units 03/07/24 09:39  Sodium 135 - 145 mEq/L 140  Potassium 3.5 - 5.1 mEq/L 4.0  Chloride 96 - 112 mEq/L 103  CO2 19 - 32 mEq/L 30  Glucose 70 - 99 mg/dL 78  BUN 6 - 23 mg/dL 11  Creatinine 9.59 - 8.79 mg/dL 9.16  Calcium  8.4 - 10.5 mg/dL 9.5  Alkaline Phosphatase 39 - 117 U/L 95   Albumin 3.5 - 5.2 g/dL 4.1  AST 0 - 37 U/L 29  ALT 0 - 35 U/L 27  Total Protein 6.0 - 8.3 g/dL 7.4  Total Bilirubin 0.2 - 1.2 mg/dL 0.3  GFR >39.99 mL/min 67.43    Old records , labs and images have been reviewed.     ASSESSMENT / PLAN / RECOMMENDATIONS:   1) Type 2 Diabetes Mellitus, Optimally controlled, With Retinopathic, neuropathic and macrovascular complications - Most recent A1c of 5.9 %. Goal A1c < 7.0 %.    - A1c remains optimal - She is not interested in restarting Metformin   - Glimepiride  caused weight gain -No changes - Reviewed most recent CMP  MEDICATIONS: Continue Ozempic   0.5 mg weekly  EDUCATION / INSTRUCTIONS: BG monitoring instructions: Patient is instructed to check her blood sugars 1 times a day    2) Diabetic complications:  Eye: Does  have known diabetic retinopathy.  Neuro/ Feet: Does  have known diabetic peripheral neuropathy .  Renal: Patient does not have known baseline CKD. She   is  on an ACEI/ARB at present.    F/U in 6 months   Signed electronically by: Stefano Redgie Butts, MD  [facility] Endocrinology  [facility] [address] [place] [place], [place] [address] Phone: [phone] FAX: [phone]   CC: Jason Leita Repine, FNP [address] [place] [address] Phone: [phone]  Fax: [phone]  Return to Endocrinology clinic as below: Future Appointments  Date Time Provider Department Center  03/10/2024  9:00 AM [facility] [address]  03/13/2024  1:30 PM Prentiss Heddy HERO, RN [facility] None  04/04/2024  9:00 AM Leonce Katz, DO [facility] None  06/30/2024 11:20 AM Jeneal,  Danita Macintosh, MD [facility] None  01/03/2025  8:40 AM [facility] VISIT 2 [facility] [address]

## 2024-03-10 ENCOUNTER — Other Ambulatory Visit: Payer: Self-pay

## 2024-03-10 ENCOUNTER — Ambulatory Visit: Admitting: Internal Medicine

## 2024-03-10 DIAGNOSIS — R0602 Shortness of breath: Secondary | ICD-10-CM

## 2024-03-10 LAB — PULMONARY FUNCTION TEST
DL/VA % pred: 85 %
DL/VA: 3.5 ml/min/mmHg/L
DLCO unc % pred: 35 %
DLCO unc: 6.76 ml/min/mmHg
FEF 25-75 Pre: 1.48 L/s
FEF2575-%Pred-Pre: 98 %
FEV1-%Pred-Pre: 70 %
FEV1-Pre: 1.42 L
FEV1FVC-%Pred-Pre: 107 %
FEV6-%Pred-Pre: 69 %
FEV6-Pre: 1.76 L
FEV6FVC-%Pred-Pre: 105 %
FVC-%Pred-Pre: 66 %
FVC-Pre: 1.78 L
Pre FEV1/FVC ratio: 80 %
Pre FEV6/FVC Ratio: 100 %

## 2024-03-10 NOTE — Progress Notes (Signed)
Spiro/dlco performed today.

## 2024-03-10 NOTE — Patient Instructions (Signed)
Spiro/dlco performed today.

## 2024-03-11 ENCOUNTER — Other Ambulatory Visit: Payer: Self-pay | Admitting: Sports Medicine

## 2024-03-13 ENCOUNTER — Other Ambulatory Visit: Payer: Self-pay

## 2024-03-13 ENCOUNTER — Telehealth: Payer: Self-pay

## 2024-03-13 NOTE — Patient Instructions (Signed)
 Visit Information  Thank you for taking time to visit with me today. Please don't hesitate to contact me if I can be of assistance to you before our next scheduled appointment.  Your next care management appointment is by telephone on 04/10/24 at 1:30 pm   Please call the care guide team at [phone] if you need to cancel, schedule, or reschedule an appointment.   Please call the Suicide and Crisis Lifeline: 988 call the [place]  National Suicide Prevention Lifeline: (858)529-7622 or TTY: 714-766-8340 TTY 316-095-2772) to talk to a trained counselor call 1-800-273-TALK (toll free, 24 hour hotline) if you are experiencing a Mental Health or Behavioral Health Crisis or need someone to talk to.  Heddy Shutter, RN, MSN, BSN, CCM [place]  [facility], Population Health Case Manager Phone: [phone]

## 2024-03-13 NOTE — Telephone Encounter (Signed)
 Copied from CRM #[id number]. Topic: Referral - Request for Referral >> Mar 13, 2024 10:55 AM Mercedes MATSU wrote: Did the patient discuss referral with their provider in the last year? No (If No - schedule appointment) (If Yes - send message)  Appointment offered? No  Type of order/referral and detailed reason for visit: patient STATES THAT SHE WAS TOLD BY ANOTHER DOCTOR SHE NEEDS TO SEE gyn FOR LEAKAGE.  Preference of office, provider, location: GYN Referral (asking for recommendations)   If referral order, have you been seen by this specialty before? No (If Yes, this issue or another issue? When? Where?  Can we respond through MyChart? No, [phone] can be reached by phone instead.

## 2024-03-13 NOTE — Patient Outreach (Signed)
 Complex Care Management   Visit Note  03/13/2024  Name:  Amy Escobar MRN: [id number] DOB: 12-17-1945  Situation: Referral received for Complex Care Management related to DM,HTN, Pulmonary Disease I obtained verbal consent from Patient.  Visit completed with Patient  on the phone  Background:   Past Medical History:  Diagnosis Date   Alkaline phosphatase deficiency    w/u Ne   Allergic rhinitis    Allergy     Anxiety    Arthritis    Cataract    BILATERAL-REMOVED   DM2 (diabetes mellitus, type 2) (HCC)    GERD (gastroesophageal reflux disease)    Gout    Hemorrhoids    Hyperlipidemia    Hypertension    Interstitial lung disease (HCC)    Mild pulmonary hypertension (HCC)    NSVT (nonsustained ventricular tachycardia) (HCC)    Osteoporosis    PVC's (premature ventricular contractions)    Thyroid  nodule    small   Tubular adenoma of colon 2017   Urticaria    Uterine fibroid     Assessment: Patient Reported Symptoms:  Cognitive Cognitive Status: No symptoms reported      Neurological Neurological Review of Symptoms: No symptoms reported    HEENT HEENT Symptoms Reported: No symptoms reported      Cardiovascular Cardiovascular Symptoms Reported: No symptoms reported    Respiratory Respiratory Symptoms Reported: No symptoms reported Other Respiratory Symptoms: wears O2 at 2l/[place] pulse oximetry 96% patient reports managed by pulmonology    Endocrine Endocrine Symptoms Reported: No symptoms reported Is patient diabetic?: Yes Is patient checking blood sugars at home?: Yes List most recent blood sugar readings, include date and time of day: did not have a fasting today. BS 213 taken this morning. a1c 5.9 per patiant Endocrine Self-Management Outcome: 4 (good)  Gastrointestinal Gastrointestinal Symptoms Reported: No symptoms reported      Genitourinary Other Genitourinary Symptoms: reports bladder infection seeing urology    Integumentary Integumentary Symptoms  Reported: No symptoms reported    Musculoskeletal Musculoskelatal Symptoms Reviewed: No symptoms reported Additional Musculoskeletal Details: per patient no change.        Psychosocial Psychosocial Symptoms Reported: No symptoms reported         Vitals:   03/13/24 1406 03/13/24 1408  BP: (!) 129/97 122/76  Pulse: 99 (!) 104  SpO2: 96%     Medications Reviewed Today     Reviewed by Shadiamond Koska M, RN (Registered Nurse) on 03/13/24 at 1356  Med List Status: <None>   Medication Order Taking? Sig Documenting Provider Last Dose Status Informant  Accu-Chek Softclix Lancets lancets [id number]  Use to prick finger to check blood glucose Shamleffer, Ibtehal Jaralla, MD  Active   aspirin  EC 81 MG tablet [id number]  Take 81 mg by mouth daily. [provider]  Active Self, Pharmacy Records  azelastine  (ASTELIN ) 0.1 % nasal spray [id number] Yes Place 2 sprays into both nostrils 2 (two) times daily. Cari Arlean HERO, FNP  Active   benzonatate  (TESSALON ) 200 MG capsule [id number] Yes Take 1 capsule (200 mg total) by mouth 3 (three) times daily as needed for cough. Kara Dorn NOVAK, MD  Active   Blood Glucose Monitoring Suppl (ACCU-CHEK GUIDE) w/Device KIT [id number]  Use to check blood glucose daily as directed Shamleffer, Ibtehal Jaralla, MD  Active   Blood Glucose Monitoring Suppl (ONETOUCH VERIO FLEX SYSTEM) w/Device PRESSLEY [id number]  USE TO TEST BLOOD SUGAR DAILY AS DIRECTED Shamleffer, Donell Cardinal, MD  Active Self, Pharmacy Records  Calcium  Carb-Cholecalciferol (  CALCIUM  + VITAMIN D3 PO) [id number] Yes Take 1 tablet by mouth daily. [provider]  Active Self, Pharmacy Records  celecoxib  (CELEBREX ) 200 MG capsule [id number]  TAKE 1 CAPSULE BY MOUTH DAILY  Patient not taking: Reported on 03/13/2024   Gretta Bertrum ORN, PA-C  Active   cephALEXin  (KEFLEX ) 250 MG capsule [id number]  Take 250 mg by mouth daily.  Patient not taking: Reported on 03/13/2024   [provider]   Active   cephALEXin  (KEFLEX ) 500 MG capsule [id number] Yes Take 500 mg by mouth 3 (three) times daily. [provider]  Active   cetirizine  (ZYRTEC ) 10 MG tablet [id number] Yes Take 0.5 tablets (5 mg total) by mouth daily. Cari Arlean HERO, FNP  Active   esomeprazole  (NEXIUM ) 40 MG capsule [id number] Yes Take 1 capsule (40 mg total) by mouth 2 (two) times daily before a meal. Jason Leita Repine, FNP  Active   fesoterodine  (TOVIAZ ) 4 MG TB24 tablet [id number] Yes Take 4 mg by mouth daily. [provider]  Active Self, Pharmacy Records  fluticasone  (FLONASE ) 50 MCG/ACT nasal spray [id number] Yes Place 2 sprays into both nostrils daily as needed for allergies or rhinitis. Cari Arlean HERO, FNP  Active   gabapentin  (NEURONTIN ) 100 MG capsule [id number] Yes Take 1 capsule (100 mg total) by mouth at bedtime. Shamleffer, Donell Cardinal, MD  Active Self, Pharmacy Records           Med Note (COFFELL, JON HERO   Wed Jun 16, 2023 11:41 AM) Pt states she uses 2 strengths of gabapentin .  gabapentin  (NEURONTIN ) 300 MG capsule [id number] Yes Take 300 mg by mouth daily as needed (neuropathy). [provider]  Active Self, Pharmacy Records  glucose blood (ACCU-CHEK GUIDE TEST) test strip [id number]  Use to test blood glucose daily Shamleffer, Ibtehal Jaralla, MD  Active   glucose blood [facility] VERIO) test strip [id number]  USE TO CHECK BLOOD SUGAR TWO TIMES A DAY Shamleffer, Donell Cardinal, MD  Active   lidocaine  (LIDODERM ) 5 % [id number] Yes APPLY 1 PATCH TO AFFECTED AREA FOR 12 HOURS IN A 24 HOUR PERIOD. DISCARD PATCH WITHIN 12 HOURS OR AS DIRECTED BY MD Leonce Katz, DO  Active   montelukast  (SINGULAIR ) 10 MG tablet [id number] Yes Take 1 tablet (10 mg total) by mouth at bedtime. Jeneal Danita Macintosh, MD  Active   Multiple Vitamins-Minerals (ZINC PO) [id number] Yes Take 1 tablet by mouth daily. [provider]  Active Self, Pharmacy Records  Pirfenidone  267 MG TABS [id number] Yes  Take 2 tablets (534 mg total) by mouth in the morning, at noon, and at bedtime. **low dose as maintenanceTHORA Hope Almarie ORN, NP  Active   rosuvastatin  (CRESTOR ) 20 MG tablet [id number] Yes TAKE 1 TABLET BY MOUTH DAILY Walker, Caitlin S, NP  Active   Semaglutide ,0.25 or 0.5MG /DOS, (OZEMPIC , 0.25 OR 0.5 MG/DOSE,) 2 MG/3ML SOPN [id number] Yes 0.5 mg by Other route once a week. Shamleffer, Ibtehal Jaralla, MD  Active   valsartan  (DIOVAN ) 160 MG tablet [id number] Yes Take 1 tablet (160 mg total) by mouth daily. Walker, Caitlin S, NP  Active           Recommendation:   Continue Current Plan of Care  Follow Up Plan:   Telephone follow up appointment date/time:  04/10/24 at 1:30 pm  Heddy Shutter, RN, MSN, BSN, CCM [place]  [facility], Population Health Case Manager Phone: [phone]

## 2024-03-14 LAB — HM DIABETES EYE EXAM

## 2024-03-16 NOTE — Telephone Encounter (Signed)
 No answer no vm

## 2024-03-28 ENCOUNTER — Encounter: Payer: Self-pay | Admitting: *Deleted

## 2024-03-28 NOTE — Telephone Encounter (Signed)
 Unable to reach letter sent

## 2024-04-03 NOTE — Progress Notes (Unsigned)
 Ben Jackson D.[facility] [address] [place] [address] Phone: [phone]   Assessment and Plan:     1. Primary osteoarthritis of both knees (Primary) 2. Chronic pain of both knees -Chronic with exacerbation, subsequent visit - Recurrence of bilateral knee pain consistent with flare of bilateral knee osteoarthritis. - Patient had no relief after HA injections on 02/03/2024, and mild to moderate relief for 1 to 2 months after CSI performed on 01/06/2024. - Recommend Zilretta  injections which should provide longer lasting relief for patient.  Will order prior authorization for bilateral Zilretta  injections today and contact patient to follow-up in clinic as soon as injections have been approved for procedure only visit. - Use Celebrex  200 mg daily as needed for breakthrough pain.  Recommend limiting chronic NSAIDs to 1-2 doses per week to prevent long-term side effects. Use Tylenol  500 to 1000 mg tablets 2-3 times a day as needed for day-to-day pain relief.     Pertinent previous records reviewed include none   Follow Up: Will order prior authorization for bilateral Zilretta  injections today and contact patient to follow-up in clinic as soon as injections have been approved for procedure only visit.     Subjective:   I, [name], am serving as a Neurosurgeon for Doctor Morene Mace   Chief Complaint: bilateral knee injection    HPI:  09/30/2021 Patient given injection today and tolerated the procedure well, discussed icing regimen and home exercise, discussed which activities to do which wants to avoid.  Discussed which activities to potentially avoid.  Patient does not want to do the home exercises on a regular basis at the moment.  Follow-up with me again 6 to 8 weeks.  Can repeat every 3 months if needed   Chronic, with exacerbation.  Has had this difficulty for some time.  Discussed the possibility of a lumbar radiculopathy with  radicular symptoms.  Discussed which activities to do which ones to avoid.  Follow-up again in 3 months   Update 12/09/2021 Laikynn Pollio is a 79 y.o. female coming in with complaint of B hip and B knee pain. Saw Dr. Mace twice in May 2023 for shoulder, elbow, and wrist pain after a fall. Worsening pain in the knees  Patient states still sore from fall. Time for injections in knees and wondering about GT injections because she has to see a kidney doctor.     Patient when seen another provider was found to have a possible lung nodule.  Was sent for a CT chest with contrast that did not show any significant nodule noted on CT scan but was found to have significant right hydronephrosis.  And then was sent for CT abdomen pelvis which did show that there was a significant right-sided hydronephrosis with possible stricture and no sign of true stone formation.   Patient also GFR has been decreased with most recent labs of 59.8   02/27/2022 Patient states wants knee and injection and hip injections   03/11/2022 Jaimi Belle Quesinberry is a 78 y.o. female coming in with complaint of B knee pain. Injected by Dr. Mace on 02/27/2022. Durolane approved. Patient states that she is would like injections in backside and gel injections.    04/14/2022 Patient states she is in a lot of pain  , her left knee goes out and her right knee hurts a lot    04/22/2022 Patient states still in pain , left is worse than right    04/26/2023  Patient states here for injection wants to discuss where to put them , has been working at the furniture market    05/26/2023   01/06/2024 Patient states she is ready for bilat CSI. Would like a gel prior auth    02/03/2024 Patient states  04/04/2024 Patient states that her pain is still the same. Injection did not work . Pain is now radiating up to her hips     Relevant Historical Information: DM type II, hypertension  Additional pertinent review of systems negative.   Current  Outpatient Medications:    Accu-Chek Softclix Lancets lancets, Use to prick finger to check blood glucose, Disp: 100 each, Rfl: 12   aspirin  EC 81 MG tablet, Take 81 mg by mouth daily., Disp: , Rfl:    azelastine  (ASTELIN ) 0.1 % nasal spray, Place 2 sprays into both nostrils 2 (two) times daily., Disp: 30 mL, Rfl: 5   benzonatate  (TESSALON ) 200 MG capsule, Take 1 capsule (200 mg total) by mouth 3 (three) times daily as needed for cough., Disp: 30 capsule, Rfl: 1   Blood Glucose Monitoring Suppl (ACCU-CHEK GUIDE) w/Device KIT, Use to check blood glucose daily as directed, Disp: 1 kit, Rfl: 0   Blood Glucose Monitoring Suppl (ONETOUCH VERIO FLEX SYSTEM) w/Device KIT, USE TO TEST BLOOD SUGAR DAILY AS DIRECTED, Disp: 1 kit, Rfl: 0   Calcium  Carb-Cholecalciferol (CALCIUM  + VITAMIN D3 PO), Take 1 tablet by mouth daily., Disp: , Rfl:    celecoxib  (CELEBREX ) 200 MG capsule, TAKE 1 CAPSULE BY MOUTH DAILY (Patient not taking: Reported on 03/13/2024), Disp: 30 capsule, Rfl: 1   cephALEXin  (KEFLEX ) 250 MG capsule, Take 250 mg by mouth daily. (Patient not taking: Reported on 03/13/2024), Disp: , Rfl:    cephALEXin  (KEFLEX ) 500 MG capsule, Take 500 mg by mouth 3 (three) times daily., Disp: , Rfl:    cetirizine  (ZYRTEC ) 10 MG tablet, Take 0.5 tablets (5 mg total) by mouth daily., Disp: 30 tablet, Rfl: 5   esomeprazole  (NEXIUM ) 40 MG capsule, Take 1 capsule (40 mg total) by mouth 2 (two) times daily before a meal., Disp: 180 capsule, Rfl: 1   estradiol (ESTRACE) 0.1 MG/GM vaginal cream, Place 1 Applicatorful vaginally at bedtime. (Patient taking differently: Place 1 Applicatorful vaginally at bedtime.), Disp: , Rfl:    fesoterodine  (TOVIAZ ) 4 MG TB24 tablet, Take 4 mg by mouth daily., Disp: , Rfl:    fluticasone  (FLONASE ) 50 MCG/ACT nasal spray, Place 2 sprays into both nostrils daily as needed for allergies or rhinitis., Disp: 16 g, Rfl: 5   gabapentin  (NEURONTIN ) 100 MG capsule, Take 1 capsule (100 mg total) by  mouth at bedtime., Disp: 90 capsule, Rfl: 3   gabapentin  (NEURONTIN ) 300 MG capsule, Take 300 mg by mouth daily as needed (neuropathy)., Disp: , Rfl:    glucose blood (ACCU-CHEK GUIDE TEST) test strip, Use to test blood glucose daily, Disp: 300 each, Rfl: 12   glucose blood (ONETOUCH VERIO) test strip, USE TO CHECK BLOOD SUGAR TWO TIMES A DAY, Disp: 100 strip, Rfl: 3   lidocaine  (LIDODERM ) 5 %, APPLY 1 PATCH TO AFFECTED AREA FOR 12 HOURS IN A 24 HOUR PERIOD. DISCARD PATCH WITHIN 12 HOURS OR AS DIRECTED BY MD, Disp: 30 patch, Rfl: 1   montelukast  (SINGULAIR ) 10 MG tablet, Take 1 tablet (10 mg total) by mouth at bedtime., Disp: 30 tablet, Rfl: 5   Multiple Vitamins-Minerals (ZINC PO), Take 1 tablet by mouth daily., Disp: , Rfl:    Pirfenidone  267 MG TABS,  Take 2 tablets (534 mg total) by mouth in the morning, at noon, and at bedtime. **low dose as maintenance**, Disp: 540 tablet, Rfl: 0   rosuvastatin  (CRESTOR ) 20 MG tablet, TAKE 1 TABLET BY MOUTH DAILY, Disp: 90 tablet, Rfl: 2   Semaglutide ,0.25 or 0.5MG /DOS, (OZEMPIC , 0.25 OR 0.5 MG/DOSE,) 2 MG/3ML SOPN, 0.5 mg by Other route once a week., Disp: 9 mL, Rfl: 3   valsartan  (DIOVAN ) 160 MG tablet, Take 1 tablet (160 mg total) by mouth daily., Disp: 90 tablet, Rfl: 2   Objective:     Vitals:   04/04/24 0909  Pulse: (!) 105  SpO2: 95%  Weight: 158 lb (71.7 kg)  Height: 5' 4 (1.626 m)      Body mass index is 27.12 kg/m.    Physical Exam:    General: Nontoxic, pleasant, alert Skin: no suspicious lesions or rashes Neuro:sensation intact, no deficits, strength 5/5 with no deficits, no atrophy, normal muscle tone Psych: No signs of anxiety, depression or other mood disorder   Bilateral knee: mild swelling No deformity Neg fluid wave, joint milking ROM Flex 100, Ext 5 TTP medial lateral joint line NTTP over the quad tendon, medial fem condyle, lat fem condyle, patella, plica, patella tendon, tibial tuberostiy, fibular head, posterior  fossa, pes anserine bursa, gerdy's tubercle, medial jt line, lateral jt line [place] causes pain bilaterally, but no palpable pop Negative anterior/posterior drawer   Gait slow with short steps.    Electronically signed by:  Odis Mace D[facility] 9:29 AM 04/04/24

## 2024-04-04 ENCOUNTER — Telehealth: Payer: Self-pay | Admitting: Sports Medicine

## 2024-04-04 ENCOUNTER — Ambulatory Visit (INDEPENDENT_AMBULATORY_CARE_PROVIDER_SITE_OTHER): Admitting: Sports Medicine

## 2024-04-04 ENCOUNTER — Other Ambulatory Visit: Payer: Self-pay

## 2024-04-04 VITALS — HR 105 | Ht 64.0 in | Wt 158.0 lb

## 2024-04-04 DIAGNOSIS — G8929 Other chronic pain: Secondary | ICD-10-CM | POA: Diagnosis not present

## 2024-04-04 DIAGNOSIS — M25561 Pain in right knee: Secondary | ICD-10-CM

## 2024-04-04 DIAGNOSIS — M25562 Pain in left knee: Secondary | ICD-10-CM | POA: Diagnosis not present

## 2024-04-04 DIAGNOSIS — M170 Bilateral primary osteoarthritis of knee: Secondary | ICD-10-CM

## 2024-04-04 NOTE — Telephone Encounter (Signed)
 Bilat zilretta 

## 2024-04-04 NOTE — Patient Instructions (Addendum)
 Bilat zilretta    Lidocaine  patches over areas of pain  Tylenol  [phone] mg 2-3 times a day for pain relief   Voltaren  gel over areas of pain   We will call you once approved

## 2024-04-04 NOTE — Telephone Encounter (Signed)
 Patient ran for Zilretta  for bilateral knees on 04/04/24. Case ID: [id number]. Pending approval.

## 2024-04-06 ENCOUNTER — Other Ambulatory Visit: Payer: Self-pay

## 2024-04-07 NOTE — Telephone Encounter (Signed)
 Can you please schedule patient when medication is stocked  Zilretta  authorized for bilateral knee NO PRE CERT REQUIRED  Coinsurance 80% Copay $20 Deductible $257 has met $257 OOP MAX $9350 has met $1454.22 Once OOP has been met coverage goes to 100%  Reference # [id number]

## 2024-04-10 ENCOUNTER — Other Ambulatory Visit: Payer: Self-pay

## 2024-04-10 NOTE — Telephone Encounter (Signed)
 Holding for medication to arrive.

## 2024-04-10 NOTE — Patient Outreach (Signed)
 Complex Care Management   Visit Note  04/10/2024  Name:  Amy Escobar MRN: [id number] DOB: 1946-06-02  Situation: Referral received for Complex Care Management related to DM,HTN, Pulmonary disease I obtained verbal consent from Patient.  Visit completed with Patient  on the phone  Background:   Past Medical History:  Diagnosis Date   Alkaline phosphatase deficiency    w/u Ne   Allergic rhinitis    Allergy     Anxiety    Arthritis    Cataract    BILATERAL-REMOVED   DM2 (diabetes mellitus, type 2) (HCC)    GERD (gastroesophageal reflux disease)    Gout    Hemorrhoids    Hyperlipidemia    Hypertension    Interstitial lung disease (HCC)    Mild pulmonary hypertension (HCC)    NSVT (nonsustained ventricular tachycardia) (HCC)    Osteoporosis    PVC's (premature ventricular contractions)    Thyroid  nodule    small   Tubular adenoma of colon 2017   Urticaria    Uterine fibroid     Assessment: Patient Reported Symptoms:  Cognitive Cognitive Status: No symptoms reported      Neurological Neurological Review of Symptoms: No symptoms reported    HEENT HEENT Symptoms Reported: No symptoms reported      Cardiovascular Cardiovascular Symptoms Reported: No symptoms reported Weight: 155 lb (70.3 kg) (patient reports taken today)  Respiratory Respiratory Symptoms Reported: No symptoms reported Other Respiratory Symptoms: wears O2 at 2l/[place], O2 sat 96 with oxygne at 2l/[place]. denies any respiratory problems    Endocrine Endocrine Symptoms Reported: No symptoms reported Is patient diabetic?: Yes Is patient checking blood sugars at home?: Yes (checks BS daily) List most recent blood sugar readings, include date and time of day: BS 177 this morning non fasting.    Gastrointestinal Gastrointestinal Symptoms Reported: No symptoms reported      Genitourinary Genitourinary Symptoms Reported: No symptoms reported Other Genitourinary Symptoms: denies any signs/symptoms of UTI at  this time    Integumentary Integumentary Symptoms Reported: No symptoms reported    Musculoskeletal Musculoskelatal Symptoms Reviewed: No symptoms reported        Psychosocial Psychosocial Symptoms Reported: No symptoms reported          Vitals:   04/10/24 1400  BP: 111/69  Pulse: 90    Medications Reviewed Today     Reviewed by Aahan Marques M, RN (Registered Nurse) on 04/10/24 at 1358  Med List Status: <None>   Medication Order Taking? Sig Documenting Provider Last Dose Status Informant  Accu-Chek Softclix Lancets lancets [id number]  Use to prick finger to check blood glucose Shamleffer, Donell Cardinal, MD  Active   aspirin  EC 81 MG tablet [id number] Yes Take 81 mg by mouth daily. [provider]  Active Self, Pharmacy Records  azelastine  (ASTELIN ) 0.1 % nasal spray [id number] Yes Place 2 sprays into both nostrils 2 (two) times daily. Cari Arlean HERO, FNP  Active   benzonatate  (TESSALON ) 200 MG capsule [id number] Yes Take 1 capsule (200 mg total) by mouth 3 (three) times daily as needed for cough. Kara Dorn NOVAK, MD  Active   Blood Glucose Monitoring Suppl (ACCU-CHEK GUIDE) w/Device KIT [id number] Yes Use to check blood glucose daily as directed Shamleffer, Ibtehal Jaralla, MD  Active   Blood Glucose Monitoring Suppl (ONETOUCH VERIO FLEX SYSTEM) w/Device PRESSLEY [id number]  USE TO TEST BLOOD SUGAR DAILY AS DIRECTED Shamleffer, Donell Cardinal, MD  Active Self, Pharmacy Records  Calcium  Carb-Cholecalciferol (CALCIUM  + VITAMIN D3 PO) [id number]  Yes Take 1 tablet by mouth daily. [provider]  Active Self, Pharmacy Records  celecoxib  (CELEBREX ) 200 MG capsule [id number]  TAKE 1 CAPSULE BY MOUTH DAILY  Patient not taking: Reported on 04/10/2024   Gretta Bertrum ORN, PA-C  Active   cephALEXin  (KEFLEX ) 250 MG capsule [id number]  Take 250 mg by mouth daily.  Patient not taking: Reported on 04/10/2024   [provider]  Active   cephALEXin  (KEFLEX ) 500 MG capsule  [id number]  Take 500 mg by mouth 3 (three) times daily.  Patient not taking: Reported on 04/10/2024   [provider]  Active   cetirizine  (ZYRTEC ) 10 MG tablet [id number] Yes Take 0.5 tablets (5 mg total) by mouth daily. Cari Arlean HERO, FNP  Active   esomeprazole  (NEXIUM ) 40 MG capsule [id number] Yes Take 1 capsule (40 mg total) by mouth 2 (two) times daily before a meal. Jason Leita Repine, FNP  Active   estradiol (ESTRACE) 0.1 MG/GM vaginal cream [id number]  Place 1 Applicatorful vaginally at bedtime.  Patient not taking: Reported on 04/10/2024   [provider]  Active   fesoterodine  (TOVIAZ ) 4 MG TB24 tablet [id number] Yes Take 4 mg by mouth daily. [provider]  Active Self, Pharmacy Records  fluticasone  (FLONASE ) 50 MCG/ACT nasal spray [id number] Yes Place 2 sprays into both nostrils daily as needed for allergies or rhinitis. Cari Arlean HERO, FNP  Active   gabapentin  (NEURONTIN ) 100 MG capsule [id number] Yes Take 1 capsule (100 mg total) by mouth at bedtime. Shamleffer, Donell Cardinal, MD  Active Self, Pharmacy Records           Med Note (COFFELL, JON HERO   Wed Jun 16, 2023 11:41 AM) Pt states she uses 2 strengths of gabapentin .  gabapentin  (NEURONTIN ) 300 MG capsule [id number] Yes Take 300 mg by mouth daily as needed (neuropathy). [provider]  Active Self, Pharmacy Records  glucose blood (ACCU-CHEK GUIDE TEST) test strip [id number]  Use to test blood glucose daily Shamleffer, Ibtehal Jaralla, MD  Active   glucose blood [facility] VERIO) test strip [id number]  USE TO CHECK BLOOD SUGAR TWO TIMES A DAY Shamleffer, Donell Cardinal, MD  Active   lidocaine  (LIDODERM ) 5 % [id number] Yes APPLY 1 PATCH TO AFFECTED AREA FOR 12 HOURS IN A 24 HOUR PERIOD. DISCARD PATCH WITHIN 12 HOURS OR AS DIRECTED BY MD Leonce Katz, DO  Active   montelukast  (SINGULAIR ) 10 MG tablet [id number] Yes Take 1 tablet (10 mg total) by mouth at bedtime. Jeneal Danita Macintosh, MD  Active    Multiple Vitamins-Minerals (ZINC PO) [id number] Yes Take 1 tablet by mouth daily. [provider]  Active Self, Pharmacy Records  Pirfenidone  267 MG TABS [id number] Yes Take 2 tablets (534 mg total) by mouth in the morning, at noon, and at bedtime. **low dose as maintenanceTHORA Hope Almarie ORN, NP  Active   rosuvastatin  (CRESTOR ) 20 MG tablet [id number] Yes TAKE 1 TABLET BY MOUTH DAILY Walker, Caitlin S, NP  Active   Semaglutide ,0.25 or 0.5MG /DOS, (OZEMPIC , 0.25 OR 0.5 MG/DOSE,) 2 MG/3ML SOPN [id number] Yes 0.5 mg by Other route once a week. Shamleffer, Ibtehal Jaralla, MD  Active   valsartan  (DIOVAN ) 160 MG tablet [id number] Yes Take 1 tablet (160 mg total) by mouth daily. Walker, Caitlin S, NP  Active           Recommendation:   Patient to continue to follow up providers as scheduled  Follow Up Plan:   Patient has met all care management goals.  Care Management case will be closed. Patient has been provided contact information should new needs arise.   Heddy Shutter, RN, MSN, BSN, CCM [place]  [facility], Population Health Case Manager Phone: [phone]

## 2024-04-10 NOTE — Patient Instructions (Signed)
 Visit Information  Thank you for taking time to visit with me today. Please don't hesitate to contact me if I can be of assistance to you.  Patient has met all care management goals. Care Management case will be closed. Patient has been provided contact information should new needs arise.   Please call the care guide team at [phone] if you need to cancel, schedule, or reschedule an appointment.   Please call the Suicide and Crisis Lifeline: 988 call the [place]  National Suicide Prevention Lifeline: 802-509-6071 or TTY: (539)641-3168 TTY (317) 704-3669) to talk to a trained counselor if you are experiencing a Mental Health or Behavioral Health Crisis or need someone to talk to.  Lindi Revering, RN, MSN, BSN, CCM [place]  [facility], Population Health Case Manager Phone: [phone]

## 2024-04-11 NOTE — Telephone Encounter (Signed)
 Scheduled

## 2024-04-12 ENCOUNTER — Ambulatory Visit: Admitting: Sports Medicine

## 2024-04-12 DIAGNOSIS — G8929 Other chronic pain: Secondary | ICD-10-CM

## 2024-04-12 DIAGNOSIS — M25561 Pain in right knee: Secondary | ICD-10-CM

## 2024-04-12 DIAGNOSIS — M170 Bilateral primary osteoarthritis of knee: Secondary | ICD-10-CM

## 2024-04-12 DIAGNOSIS — M25562 Pain in left knee: Secondary | ICD-10-CM

## 2024-04-12 MED ORDER — TRIAMCINOLONE ACETONIDE 32 MG IX SRER
32.0000 mg | Freq: Once | INTRA_ARTICULAR | Status: AC
  Administered 2024-04-12: 32 mg via INTRA_ARTICULAR

## 2024-04-12 NOTE — Patient Instructions (Signed)
 As needed follow up   If pain returns in 2026 call and ask for zilretta  injection approval

## 2024-04-12 NOTE — Progress Notes (Signed)
 Amy Escobar D.[facility] [address] [place] [address] Phone: [phone]   Assessment and Plan:     1. Primary osteoarthritis of both knees (Primary) 2. Chronic pain of both knees -Chronic with exacerbation, subsequent visit - Recurrence of bilateral knee pain consistent with flare of bilateral knee osteoarthritis - Patient had no relief with HA injections on 02/03/2024 and mild to moderate relief with CSI on 01/06/2024.  Patient elected for Zilretta  injections at today's visit.  Tolerated well per note below - Use Celebrex  200 mg daily as needed for breakthrough pain.  Recommend limiting chronic NSAIDs to 1-2 doses per week to prevent long-term side effects. Use Tylenol  500 to 1000 mg tablets 2-3 times a day as needed for day-to-day pain relief.      Procedure: Knee Joint Injection Side: Bilateral Indication: Flare of osteoarthritis  Risks explained and consent was given verbally. The site was cleaned with alcohol prep. A needle was introduced with an anterio-lateral approach. Injection given using Zilretta  32 mg. This was well tolerated.  Needle was removed, hemostasis achieved, and post injection instructions were explained.  Procedure was repeated on contralateral side.  Pt was advised to call or return to clinic if these symptoms worsen or fail to improve as anticipated.   Pertinent previous records reviewed include none   Follow Up: As needed.  If patient receives at least 3 months relief from Zilretta  injections, could consider repeat Zilretta  injections.   Subjective:   I, [name], am serving as a Neurosurgeon for Doctor Morene Mace   Chief Complaint: bilateral knee injection    HPI:  09/30/2021 Patient given injection today and tolerated the procedure well, discussed icing regimen and home exercise, discussed which activities to do which wants to avoid.  Discussed which activities to potentially avoid.  Patient does not want  to do the home exercises on a regular basis at the moment.  Follow-up with me again 6 to 8 weeks.  Can repeat every 3 months if needed   Chronic, with exacerbation.  Has had this difficulty for some time.  Discussed the possibility of a lumbar radiculopathy with radicular symptoms.  Discussed which activities to do which ones to avoid.  Follow-up again in 3 months   Update 12/09/2021 Amy Escobar is a 78 y.o. female coming in with complaint of B hip and B knee pain. Saw Dr. Mace twice in May 2023 for shoulder, elbow, and wrist pain after a fall. Worsening pain in the knees  Patient states still sore from fall. Time for injections in knees and wondering about GT injections because she has to see a kidney doctor.     Patient when seen another provider was found to have a possible lung nodule.  Was sent for a CT chest with contrast that did not show any significant nodule noted on CT scan but was found to have significant right hydronephrosis.  And then was sent for CT abdomen pelvis which did show that there was a significant right-sided hydronephrosis with possible stricture and no sign of true stone formation.   Patient also GFR has been decreased with most recent labs of 59.8   02/27/2022 Patient states wants knee and injection and hip injections   03/11/2022 Tria Noguera Escobar is a 78 y.o. female coming in with complaint of B knee pain. Injected by Dr. Mace on 02/27/2022. Durolane approved. Patient states that she is would like injections in backside and gel injections.  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 Would like a gel prior auth     02/03/2024 Patient states   04/04/2024 Patient states that her pain is still the same. Injection did not work . Pain is now radiating up to her hips   04/12/2024 Ready for zilretta    Relevant Historical Information: DM type II, hypertension  Additional pertinent review of systems negative.   Current Outpatient Medications:    Accu-Chek Softclix Lancets lancets, Use to prick finger to check blood glucose, Disp: 100 each, Rfl: 12   aspirin  EC 81 MG tablet, Take 81 mg by mouth daily., Disp: , Rfl:    azelastine  (ASTELIN ) 0.1 % nasal spray, Place 2 sprays into both nostrils 2 (two) times daily., Disp: 30 mL, Rfl: 5   benzonatate  (TESSALON ) 200 MG capsule, Take 1 capsule (200 mg total) by mouth 3 (three) times daily as needed for cough., Disp: 30 capsule, Rfl: 1   Blood Glucose Monitoring Suppl (ACCU-CHEK GUIDE) w/Device KIT, Use to check blood glucose daily as directed, Disp: 1 kit, Rfl: 0   Blood Glucose Monitoring Suppl (ONETOUCH VERIO FLEX SYSTEM) w/Device KIT, USE TO TEST BLOOD SUGAR DAILY AS DIRECTED, Disp: 1 kit, Rfl: 0   Calcium  Carb-Cholecalciferol (CALCIUM  + VITAMIN D3 PO), Take 1 tablet by mouth daily., Disp: , Rfl:    celecoxib  (CELEBREX ) 200 MG capsule, TAKE 1 CAPSULE BY MOUTH DAILY (Patient not taking: Reported on 04/10/2024), Disp: 30 capsule, Rfl: 1   cephALEXin  (KEFLEX ) 250 MG capsule, Take 250 mg by mouth daily. (Patient not taking: Reported on 04/10/2024), Disp: , Rfl:    cephALEXin  (KEFLEX ) 500 MG capsule, Take 500 mg by mouth 3 (three) times daily. (Patient not taking: Reported on 04/10/2024), Disp: , Rfl:    cetirizine  (ZYRTEC ) 10 MG tablet, Take 0.5 tablets (5 mg total) by mouth daily., Disp: 30 tablet, Rfl: 5   esomeprazole  (NEXIUM ) 40 MG capsule, Take 1 capsule (40 mg total) by mouth 2 (two) times daily before a meal., Disp: 180 capsule, Rfl: 1   estradiol (ESTRACE) 0.1 MG/GM vaginal cream, Place 1 Applicatorful vaginally at bedtime. (Patient not taking: Reported on  04/10/2024), Disp: , Rfl:    fesoterodine  (TOVIAZ ) 4 MG TB24 tablet, Take 4 mg by mouth daily., Disp: , Rfl:    fluticasone  (FLONASE ) 50 MCG/ACT nasal spray, Place 2 sprays into both nostrils daily as needed for allergies or rhinitis., Disp: 16 g, Rfl: 5   gabapentin  (NEURONTIN ) 100 MG capsule, Take 1 capsule (100 mg total) by mouth at bedtime., Disp: 90 capsule, Rfl: 3   gabapentin  (NEURONTIN ) 300 MG capsule, Take 300 mg by mouth daily as needed (neuropathy)., Disp: , Rfl:    glucose blood (ACCU-CHEK GUIDE TEST) test strip, Use to test blood glucose daily, Disp: 300 each, Rfl: 12   glucose blood (ONETOUCH VERIO) test strip, USE TO CHECK BLOOD SUGAR TWO TIMES A DAY, Disp: 100 strip, Rfl: 3   lidocaine  (LIDODERM ) 5 %, APPLY 1 PATCH TO AFFECTED AREA FOR 12 HOURS IN A 24 HOUR PERIOD. DISCARD PATCH WITHIN 12 HOURS OR AS DIRECTED  BY MD, Disp: 30 patch, Rfl: 1   montelukast  (SINGULAIR ) 10 MG tablet, Take 1 tablet (10 mg total) by mouth at bedtime., Disp: 30 tablet, Rfl: 5   Multiple Vitamins-Minerals (ZINC PO), Take 1 tablet by mouth daily., Disp: , Rfl:    Pirfenidone  267 MG TABS, Take 2 tablets (534 mg total) by mouth in the morning, at noon, and at bedtime. **low dose as maintenance**, Disp: 540 tablet, Rfl: 0   rosuvastatin  (CRESTOR ) 20 MG tablet, TAKE 1 TABLET BY MOUTH DAILY, Disp: 90 tablet, Rfl: 2   Semaglutide ,0.25 or 0.5MG /DOS, (OZEMPIC , 0.25 OR 0.5 MG/DOSE,) 2 MG/3ML SOPN, 0.5 mg by Other route once a week., Disp: 9 mL, Rfl: 3   valsartan  (DIOVAN ) 160 MG tablet, Take 1 tablet (160 mg total) by mouth daily., Disp: 90 tablet, Rfl: 2      Electronically signed by:  Odis Mace D.[facility] 1:22 PM 04/12/24

## 2024-04-14 ENCOUNTER — Encounter: Payer: Self-pay | Admitting: Internal Medicine

## 2024-04-14 ENCOUNTER — Ambulatory Visit (INDEPENDENT_AMBULATORY_CARE_PROVIDER_SITE_OTHER): Admitting: Internal Medicine

## 2024-04-14 VITALS — BP 126/72 | HR 85 | Temp 99.0°F | Ht 64.0 in | Wt 155.0 lb

## 2024-04-14 DIAGNOSIS — R0902 Hypoxemia: Secondary | ICD-10-CM | POA: Diagnosis not present

## 2024-04-14 DIAGNOSIS — R0609 Other forms of dyspnea: Secondary | ICD-10-CM

## 2024-04-14 DIAGNOSIS — Z23 Encounter for immunization: Secondary | ICD-10-CM | POA: Diagnosis not present

## 2024-04-14 DIAGNOSIS — J849 Interstitial pulmonary disease, unspecified: Secondary | ICD-10-CM

## 2024-04-14 DIAGNOSIS — J84112 Idiopathic pulmonary fibrosis: Secondary | ICD-10-CM | POA: Diagnosis not present

## 2024-04-14 LAB — HEPATIC FUNCTION PANEL
ALT: 22 U/L (ref 0–35)
AST: 25 U/L (ref 0–37)
Albumin: 4.4 g/dL (ref 3.5–5.2)
Alkaline Phosphatase: 121 U/L — ABNORMAL HIGH (ref 39–117)
Bilirubin, Direct: 0.1 mg/dL (ref 0.0–0.3)
Total Bilirubin: 0.3 mg/dL (ref 0.2–1.2)
Total Protein: 8.4 g/dL — ABNORMAL HIGH (ref 6.0–8.3)

## 2024-04-14 LAB — BRAIN NATRIURETIC PEPTIDE: Pro B Natriuretic peptide (BNP): 43 pg/mL (ref 0.0–100.0)

## 2024-04-14 NOTE — Progress Notes (Signed)
 OV 04/20/2023 -of care to the ILD center.  Referred by Dr. Shellia.  Subjective:  Patient ID: Amy Escobar, female , DOB: 1946/02/14 , age 78 y.o. , MRN: [id number] , ADDRESS: [address] [place] [id number] PCP Jason Leita Repine, FNP Patient Care Team: Jason Leita Repine, FNP as PCP - General (Internal Medicine) Hobart Powell BRAVO, MD (Inactive) as PCP - Cardiology (Cardiology) Lenon Faden, MD (Internal Medicine) Perri Bjork, PA-C as Physician Assistant (Obstetrics and Gynecology) Claudene Victory ORN, MD (Inactive) as Consulting Physician (Cardiology) Debrah Lamar BIRCH, MD (Inactive) as Consulting Physician (Gastroenterology) Elner Arley LABOR, MD as Consulting Physician (Ophthalmology) Darlean Ozell NOVAK, MD as Consulting Physician (Pulmonary Disease) Claudene Arthea HERO, DO as Attending Physician (Family Medicine) Loreda Hacker, DPM as Consulting Physician (Podiatry)  This Provider for this visit: Treatment Team:  Attending Provider: Geronimo Amel, MD    04/20/2023 -   Chief Complaint  Patient presents with   Follow-up    Pt was put on symbicort  and she does take it everyday. Pt is f/u Ild has concerns about the scarring      HPI Amy Escobar 78 y.o. -I am meeting for the first time.  Presents with daughter Amy Escobar.  Both independent historians.  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  As best as I can gather patient is to be followed by Dr. Ozell Darlean.  4 years ago there was significant allergy  testing positivity with seasonal allergy  positive to grass and dust including dust mite and Alternaria and pet dander.  She also has a elevated IgE greater than 1000 sometimes even 2000.  She has been having cough for the last 5 to 6 years present both day and night.  Is associated with acid reflux decreased by Mucinex  considered moderate to severe.  Also shortness of breath for the last 2 or 3 years years  particular when she climbs stairs but not for other simple activities of daily living.  She saw Dr. Shellia who did a high-resolution CT chest.  See below.  I personally visualized his agree with probable UIP pattern.  Therefore she has been referred here.  She denies any mold exposure of bird feather or down comforter exposure. That she is noticed to have acid reflux but also on Fosamax .   She has had serologies trace positive for rheumatoid factor but otherwise negative.  - Medications reviewed.      CT Chest data from date:   ive & Impression  CLINICAL DATA:  Abnormal chest radiograph. Evaluate for interstitial lung disease.   EXAM: CT CHEST WITHOUT CONTRAST   TECHNIQUE: Multidetector CT imaging of the chest was performed following the standard protocol without intravenous contrast. High resolution imaging of the lungs, as well as inspiratory and expiratory imaging, was performed.   RADIATION DOSE REDUCTION: This exam was performed according to the departmental dose-optimization program which includes automated exposure control, adjustment of the mA and/or kV according to patient size and/or use of iterative reconstruction technique.   COMPARISON:  01/14/2023 chest radiograph.   FINDINGS: Cardiovascular: Normal heart size. No significant pericardial effusion/thickening. Left anterior descending coronary atherosclerosis. Atherosclerotic nonaneurysmal thoracic aorta. Normal caliber pulmonary arteries.   Mediastinum/Nodes: No significant thyroid  nodules. Unremarkable esophagus. No pathologically enlarged axillary, mediastinal or hilar lymph nodes, noting limited sensitivity for the detection of hilar adenopathy on this noncontrast study.   Lungs/Pleura: No pneumothorax. No pleural effusion. No acute consolidative airspace disease, lung masses  or significant pulmonary nodules. No significant lobular air trapping or evidence of tracheobronchomalacia on the expiration  sequence. Moderate patchy confluent subpleural reticulation and ground-glass opacity throughout both lungs with associated moderate traction bronchiectasis and architectural distortion. There is a mild basilar predominance to these findings. No frank honeycombing. Comparison to the 11/10/2021 chest CT study is limited by prominent motion degradation on the prior scan, favor mild interval progression.   Upper abdomen: No acute abnormality.   Musculoskeletal: No aggressive appearing focal osseous lesions. Mild thoracic spondylosis.   IMPRESSION: 1. Spectrum of findings c 07/05/2023 Discussed the use of AI scribe software for clinical note transcription with the patient, who gave verbal consent to proceed.  Patient was given a provisional diagnosis of IPF based on age, disease prodominantly in lower lobes (probably UIP) and normal serology. She was started on pirfenidone  in October. Liver function was slightly elevated in December, medication was stopped per Dr. Geronimo and restarted at lower dose. The patient reported experiencing dizziness and feeling stoned while on the medication, to the point of being unable to drive. Patient notified our office on 06/14/2023 with reports of bronchitis symptoms with increased shortness of breath. She was admitted for IPF flare vs PNA. She was discharged on Augmentin  which she has completed and has 1 day left of prednisone  as well at home oxygen  at 2L. Respiratory-wise, the patient denies any current breathing difficulties or shortness of breath. They reported a very minimal cough with occasional clear mucus production, and no wheezing or chest tightness. They also mentioned having acid reflux, which is exacerbated by certain foods.  The patient also has a bladder problem, for which they take Toviaz . They reported that they have been waking up to urinate and have been experiencing urinary urgency. They also mentioned that they have been having issues with  emptying their bladder completely. The patient was on a daily antibiotic, Keflex , for urinary tract infection prevention, but it was stopped during their hospital stay due to a potential interaction with the Pirfenidone .    uidelines: Diagnosis of Idiopathic Pulmonary Fibrosis: An Official ATS/ERS/JRS/ALAT Clinical Practice Guideline. Am JINNY Honey Crit Care Med Vol 198, Iss 5, [phone], Feb 27 2017. 2. One vessel coronary atherosclerosis. 3.  Aortic Atherosclerosis (ICD10-I70.0).     Electronically Signed   By: Selinda DELENA Blue M.D.   On: 02/04/2023 14:22    Latest Reference Range & Units 01/28/07 09:21 03/26/08 11:40 06/19/09 07:31 12/20/09 10:02 03/09/10 09:13 07/07/10 07:51 07/31/11 08:24 08/05/12 09:12 08/04/13 14:09 04/03/14 11:14 08/06/14 09:03 11/16/14 11:26 08/09/15 09:09 08/12/16 08:18 07/04/18 15:54 08/22/18 09:40 11/30/18 12:26 12/15/19 16:07 02/13/20 10:08 04/05/20 10:20 07/26/20 16:11 02/07/21 11:20 09/16/21 13:27 11/18/21 08:37 03/03/22 08:49 08/07/22 13:30  Eosinophils Absolute 0.0 - 0.7 K/uL 0.4 0.2 0.3 0.2 0.3 0.2 0.2 0.7 0.5 0.2 0.4 0.3 0.2 0.3 0.2 0.2 0.1 0.2 214 0.3 0.2 0.2 0.1 0.3 0.1 0.2     Latest Reference Range & Units 01/21/23 09:50  Immature Granulocytes Not Estab. % 0  Immature Grans (Abs) 0.0 - 0.1 x10E3/uL 0.0  IgE (Immunoglobulin E), Serum 6 - 495 IU/mL 1,646 (H)  ANA Titer 1  Negative  dsDNA Ab 0 - 9 IU/mL <1  ENA RNP Ab 0.0 - 0.9 AI 0.3  ENA SSA (RO) Ab 0.0 - 0.9 AI <0.2  ENA SSB (LA) Ab 0.0 - 0.9 AI <0.2  RA Latex Turbid. <14.0 IU/mL 15.5 (H)  ENA SM Ab Ser-aCnc 0.0 - 0.9 AI <0.2  Scleroderma (Scl-70) (ENA) Antibody,  IgG 0.0 - 0.9 AI <0.2  (H): Data is abnormally high PFT   OV 09/13/2023  Subjective:  Patient ID: Amy Escobar, female , DOB: 1945-07-23 , age 24 y.o. , MRN: [id number] , ADDRESS: [address] [place] [id number] PCP Jason Leita Repine, FNP Patient Care Team: Jason Leita Repine, FNP as PCP -  General (Internal Medicine) Hobart Powell BRAVO, MD (Inactive) as PCP - Cardiology (Cardiology) Lenon Faden, MD (Internal Medicine) Perri Bjork, PA-C as Physician Assistant (Obstetrics and Gynecology) Claudene Victory ORN, MD (Inactive) as Consulting Physician (Cardiology) Debrah Lamar BIRCH, MD (Inactive) as Consulting Physician (Gastroenterology) Elner Arley LABOR, MD as Consulting Physician (Ophthalmology) Darlean Ozell NOVAK, MD as Consulting Physician (Pulmonary Disease) Claudene Arthea HERO, DO as Attending Physician (Family Medicine) Loreda Hacker, DPM as Consulting Physician (Podiatry)  This Provider for this visit: Treatment Team:  Attending Provider: Geronimo Amel, MD    09/13/2023 -   Chief Complaint  Patient presents with   Follow-up    Breathing has overall been doing well. She is using her o2 mainly just at night. She has a prod cough with white sputum.    5.  HPI Amy Escobar 78 y.o. -returns for follow-up with her daughter.  At this visit she says she is on low-dose pirfenidone .  This because the high dose gave a lot of side effects.  She is tolerating low-dose fine without any problems.  She feels her shortness of breath is also stable.  Her main issues   - cough.  It is severe.  Daughter feels it is allergy  related.  She did see the allergist she did not remember much details.  I went and reviewed the note.  Informed her that the allergist is considering allergy  shots.  And advised that she needs to follow-up.  Initially considered Xolair but then based on allergy  notes have recommended that she revisit with Dr. Jeneal  -Wanted no follow-up of CT scan: The CT scan in the hospital in December 2024 ruled out PE but showed pulmonary infiltrates.  She needs a repeat follow-up CT scan of the chest which we will get in couple of months I have personally visualized the CT scan from December 2024.  Also reviewed external records of the hospital.  -Other treatments: I told her  pirfenidone  and nintedanib are the only treatment modulating drugs.  She can try other over-the-counter or natural herbs but we do not know if this worked.  Recommended she consider clinical trial participation.  -Disease monitoring: Last pulmonary function test summer 2024.  She needs another PFT and CT scan.  Exercise hypoxemia test shows a tendency to desaturate but her oxygenation is adequate.   OV 12/10/2023  Subjective:  Patient ID: Amy Escobar, female , DOB: 26-Apr-1946 , age 41 y.o. , MRN: [id number] , ADDRESS: [address] [place] [id number] PCP Jason Leita Repine, FNP Patient Care Team: Jason Leita Repine, FNP as PCP - General (Internal Medicine) Hobart Powell BRAVO, MD (Inactive) as PCP - Cardiology (Cardiology) Lenon Faden, MD (Internal Medicine) Perri Bjork, PA-C as Physician Assistant (Obstetrics and Gynecology) Claudene Victory ORN, MD (Inactive) as Consulting Physician (Cardiology) Debrah Lamar BIRCH, MD (Inactive) as Consulting Physician (Gastroenterology) Elner Arley LABOR, MD as Consulting Physician (Ophthalmology) Darlean Ozell NOVAK, MD as Consulting Physician (Pulmonary Disease) Claudene Arthea HERO, DO as Attending Physician (Family Medicine) Loreda Hacker, DPM as Consulting Physician (Podiatry) Lonzell Planas, RN as Registered Nurse  This  Provider for this visit: Treatment Team:  Attending Provider: Geronimo Amel, MD    12/10/2023 -   Chief Complaint  Patient presents with   Follow-up    Follow up from  PFT , discuss change her oxygen  and discuss her medication, still coughing      HPI Amy Escobar 78 y.o. -returns for follow-up.  She feels she is doing well.  No hospitalizations no ER visits no urgent care visits no surgeries.  She started taking [place] extract for cough and after this this has helped.  She is wondering if she should take it with tea or honey but I told her to reach out to commercial news and social  contacts.  She is currently on pirfenidone  and is able to tolerate the low-dose protocol.  She is unable to tolerate the higher dose protocol.  She had pulmonary function test today and shows a decline.  She had high-resolution CT chest is also shows a decline.  Personally visualized it and showed it to her.  I told her I was very concerned about the decline in the long-term implications of this.  Subjectively she is not feeling any different with objective data shows decline.   We discussed options - Discussed switching over to nintedanib but because of the reported half percent MI risk she does not want to do this. - Discussed upcoming Nerandomilast potential approval end of the year 2025/early 2026: She will entertain this if the drug is approved and when it is approved - In the interim we discussed clinical trial with IV infusion trial [a few upcoming at our location] and she is interested in this - We also discussed participation in the IPF-pro registry and she is interested - She wanted a portable oxygen  concentrator and be qualified her for this [current tank is a little too heavy]  OV 04/14/2024  Subjective:  Patient ID: Amy Escobar, female , DOB: [birth year] , age 87 y.o. , MRN: [id number] , ADDRESS: [address] [place] [id number] PCP Jason Leita Repine, FNP (Inactive) Patient Care Team: Jason Leita Repine, FNP (Inactive) as PCP - General (Internal Medicine) Hobart Powell BRAVO, MD (Inactive) as PCP - Cardiology (Cardiology) Lenon Faden, MD (Internal Medicine) Perri Bjork, PA-C as Physician Assistant (Obstetrics and Gynecology) Claudene Victory ORN, MD (Inactive) as Consulting Physician (Cardiology) Debrah Lamar BIRCH, MD (Inactive) as Consulting Physician (Gastroenterology) Elner Arley LABOR, MD as Consulting Physician (Ophthalmology) Darlean Ozell NOVAK, MD as Consulting Physician (Pulmonary Disease) Claudene Arthea HERO, DO as Attending Physician  (Family Medicine) Loreda Hacker, DPM as Consulting Physician (Podiatry) Prentiss Heddy HERO, RN as [facility] Care Management  This Provider for this visit: Treatment Team:  Attending Provider: Geronimo Amel, MD    04/14/2024 -   Chief Complaint  Patient presents with   Interstitial Lung Disease    PFT F/U Pt states since LOV breathing has been okay SOB w/ exertion depending on what patient is doing  Prod cough ( phlegm white)    #IPF diagnosed given October 2024 and started pirfenidone  ( -Giving a provisional diagnosis of idiopathic pulmonary fibrosis [IPF].  This is based on age greater than 68, disease predominantly in the lower lobe, probable UIP description on the CT scan, progression since 2020 [he had normal pulmonary function test in 2015], and near normal serology blood work  -As of March 2025 on low-dose pirfenidone  protocol  -Trace positive rheumatoid factor in July 2024  #CT imaging   - last HRCT  April 2025 with progression since 2024  #ECHO  - last dec 2024  # Significant elevation in IgE  -2300in  2021 and 1600 in July 2024  -Positive for dog dander, [place] grass cockroach ragweed dust mite and different trees\  -Seen by Dr. Jeneal allergist and fall 2024.  #Hospitalization for pneumonia respiratory failure in December 2024  #Esbriet /Pirfenidone  requires intensive drug monitoring due to high concerns for Adverse effects of , including  Drug Induced Liver Injury, significant GI side effects that include but not limited to Diarrhea, Nausea, Vomiting,  and other system side effects that include Fatigue, headaches, weight loss and other side effects such as skin rash. These will be monitored with  blood work such as LFT initially once a month for 6 months and then quarterly   - Intolerance to higher dose pirfenidone   -Low-dose pirfenidone  since late 2024/early 2025  HPI Amy Escobar 78 y.o. -presents for follow-up.  IPF progressive phenotype.  Presents with  Amy Escobar the daughter.  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  Since last seeing her in June 2020 for she is dealing with bladder and vaginal prolapse.  Other than that she is not having any medical issues.  No ER visits no hospitalizations no surgeries.  She feels she is stable.  She is tolerating low-dose pirfenidone  quite well.  Her exercise hypoxemia test seems adequate but his symptom scores are either range bound or getting worse.  She had pulmonary function test and the DLCO relative to the [facility] shows significant decline raising the possibility of pulmonary hypertension.      SYMPTOM SCALE - ILD 04/20/2023 Recoemed esbreit after this visit 09/13/2023 Low dose esbiret 12/10/2023 :pw dose esbriet  protocl  + portable o2 04/14/2024 Low dose esbriet   Current weight      O2 use ra ra ra   Shortness of Breath 0 -> 5 scale with 5 being worst (score 6 If unable to do)     At rest 0 0 0 0  Simple tasks - showers, clothes change, eating, shaving 0 2 1 3   Household (dishes, doing bed, laundry) 2 3 4 2   Shopping 3 4 1 2   Walking level at own pace 3 3 3.5 3  Walking up Stairs 5 0 1 5  Total (30-36) Dyspnea Score 18 11 10.5 15  How bad is your cough? 2 5 2  - better with [place] in tea 0  How bad is your fatigue 3 4 4  0  How bad is nausea 0 0 2 0  How bad is vomiting?  0 0 0 0  How bad is diarrhea? 0 0 0 0  How bad is anxiety? 0 2 0 0  How bad is depression 0 2 0 0  Any chronic pain - if so where and how bad 0 0 0 0   Simple office walk 224 (66+46 x 2) feet Pod A at [facility] x  3 laps goal with forehead probe 09/13/2023  12/10/2023  04/14/2024   O2 used ra ra   Number laps completed 3 [place] 3 laps but stopped at 2   Comments about pace Mod pac Modated pace   Resting Pulse Ox/HR 100% and 90/min 98% and HR 88   Final Pulse Ox/HR 93% and 102/min 88% at 2 laps  HR 89   Desaturated </= 88% no yes   Desaturated <= 3% points yes yes   Got Tachycardic >/= 90/min yes no   Symptoms at  end of  test No dyspnea DYSPNEIC +   Miscellaneous comments x Corrected 2L [place] statyed at 97%         SIT STAND TEST - goal 15 times   04/14/2024    O2 used ra   PRobe - finter or forehead finger   Number sit and stand completed - goal 15 15   Time taken to complete 59 sec   Resting Pulse Ox/HR/Dyspnea  98% and 86/min and dyspnea of 8/10    Peak measures 94 % and 98/min and dyspnea of 8/10   Final Pulse Ox/HR 94% and 94/min and dyspnea of 8/10   Desaturated </= 88% no   Desaturated <= 3% points yes   Got Tachycardic >/= 90/min yes   Miscellaneous comments no       PFT     Latest Ref Rng & Units 03/10/2024    8:32 AM 11/24/2023    2:48 PM 01/25/2023    8:33 AM 07/08/2020   10:05 AM 09/12/2013   11:57 AM  PFT Results  FVC-Pre L 1.78  1.70  1.96  1.75  2.53   FVC-Predicted Pre % 66  62  71  78  103   FVC-Post L   2.05  1.54  2.42   FVC-Predicted Post %   74  68  99   Pre FEV1/FVC % % 80  86  81  75  79   Post FEV1/FCV % %   85  97  83   FEV1-Pre L 1.42  1.45  1.59  1.31  2.00   FEV1-Predicted Pre % 70  72  77  75  106   FEV1-Post L   1.74  1.49  2.01   DLCO uncorrected ml/min/mmHg 6.76  7.98  10.18  21.45  17.08   DLCO UNC% % 35  42  53  111  70   DLCO corrected ml/min/mmHg  7.98  10.18  21.45    DLCO COR %Predicted %  42  53  111    DLVA Predicted % 85  74  76  85  94   TLC L   4.32  2.94  4.12   TLC % Predicted %   85  58  81   RV % Predicted %   116  55  76     Immunization History  Administered Date(s) Administered   Fluad Quad(high Dose 65+) 04/18/2019   INFLUENZA, HIGH DOSE SEASONAL PF 03/11/2023, 03/07/2024   Influenza Split 03/23/2011, 04/07/2013   Influenza Whole 04/13/2008, 04/29/2009, 03/29/2010, 03/29/2018   Influenza-Unspecified 04/08/2012, 03/28/2014, 03/14/2015, 03/13/2016, 04/15/2020   PFIZER(Purple Top)SARS-COV-2 Vaccination 08/13/2019, 09/05/2019, 04/15/2020   PNEUMOCOCCAL CONJUGATE-20 04/14/2024   PPD Test 03/23/2011   Pfizer Covid-19 Vaccine  Bivalent Booster 52yrs & up 04/29/2021   Pneumococcal Conjugate-13 01/10/2014   Pneumococcal Polysaccharide-23 07/07/2010, 03/31/2018   Td 12/27/2001   Tdap 08/06/2014   Zoster Recombinant(Shingrix) 03/31/2018      LAB RESULTS last 96 hours No results found.       has a past medical history of Alkaline phosphatase deficiency, Allergic rhinitis, Allergy , Anxiety, Arthritis, Cataract, DM2 (diabetes mellitus, type 2) (HCC), GERD (gastroesophageal reflux disease), Gout, Hemorrhoids, Hyperlipidemia, Hypertension, Interstitial lung disease (HCC), Mild pulmonary hypertension (HCC), NSVT (nonsustained ventricular tachycardia) (HCC), Osteoporosis, PVC's (premature ventricular contractions), Thyroid  nodule, Tubular adenoma of colon (2017), Urticaria, and Uterine fibroid.   reports that she quit smoking about 55 years ago. Her smoking use included cigarettes. She started smoking about 60 years ago. She has  a 1.3 pack-year smoking history. She has never used smokeless tobacco.  Past Surgical History:  Procedure Laterality Date   CATARACT EXTRACTION Bilateral 2016   COLONOSCOPY  2007   echocardiogram (other)  01/16/2002   POLYPECTOMY     removed tumors from foot nerves  04/1999   stress cardiolite   02/12/2006   TOENAIL EXCISION     TUBAL LIGATION     TYMPANOSTOMY TUBE PLACEMENT      Allergies  Allergen Reactions   Benicar [Olmesartan] Other (See Comments)    Headache     Immunization History  Administered Date(s) Administered   Fluad Quad(high Dose 65+) 04/18/2019   INFLUENZA, HIGH DOSE SEASONAL PF 03/11/2023, 03/07/2024   Influenza Split 03/23/2011, 04/07/2013   Influenza Whole 04/13/2008, 04/29/2009, 03/29/2010, 03/29/2018   Influenza-Unspecified 04/08/2012, 03/28/2014, 03/14/2015, 03/13/2016, 04/15/2020   PFIZER(Purple Top)SARS-COV-2 Vaccination 08/13/2019, 09/05/2019, 04/15/2020   PNEUMOCOCCAL CONJUGATE-20 04/14/2024   PPD Test 03/23/2011   Pfizer Covid-19 Vaccine Bivalent  Booster 80yrs & up 04/29/2021   Pneumococcal Conjugate-13 01/10/2014   Pneumococcal Polysaccharide-23 07/07/2010, 03/31/2018   Td 12/27/2001   Tdap 08/06/2014   Zoster Recombinant(Shingrix) 03/31/2018    Family History  Problem Relation Age of Onset   Heart disease Mother    Allergic rhinitis Mother    Heart disease Father    Stomach cancer Maternal Grandmother    Heart disease Maternal Grandfather    Rectal cancer Maternal Grandfather    Colon cancer Neg Hx    Breast cancer Neg Hx    Colon polyps Neg Hx    Esophageal cancer Neg Hx      Current Outpatient Medications:    Accu-Chek Softclix Lancets lancets, Use to prick finger to check blood glucose, Disp: 100 each, Rfl: 12   aspirin  EC 81 MG tablet, Take 81 mg by mouth daily., Disp: , Rfl:    azelastine  (ASTELIN ) 0.1 % nasal spray, Place 2 sprays into both nostrils 2 (two) times daily., Disp: 30 mL, Rfl: 5   benzonatate  (TESSALON ) 200 MG capsule, Take 1 capsule (200 mg total) by mouth 3 (three) times daily as needed for cough., Disp: 30 capsule, Rfl: 1   Blood Glucose Monitoring Suppl (ACCU-CHEK GUIDE) w/Device KIT, Use to check blood glucose daily as directed, Disp: 1 kit, Rfl: 0   Blood Glucose Monitoring Suppl (ONETOUCH VERIO FLEX SYSTEM) w/Device KIT, USE TO TEST BLOOD SUGAR DAILY AS DIRECTED, Disp: 1 kit, Rfl: 0   Calcium  Carb-Cholecalciferol (CALCIUM  + VITAMIN D3 PO), Take 1 tablet by mouth daily., Disp: , Rfl:    celecoxib  (CELEBREX ) 200 MG capsule, TAKE 1 CAPSULE BY MOUTH DAILY, Disp: 30 capsule, Rfl: 1   cetirizine  (ZYRTEC ) 10 MG tablet, Take 0.5 tablets (5 mg total) by mouth daily., Disp: 30 tablet, Rfl: 5   esomeprazole  (NEXIUM ) 40 MG capsule, Take 1 capsule (40 mg total) by mouth 2 (two) times daily before a meal., Disp: 180 capsule, Rfl: 1   fesoterodine  (TOVIAZ ) 4 MG TB24 tablet, Take 4 mg by mouth daily., Disp: , Rfl:    fluticasone  (FLONASE ) 50 MCG/ACT nasal spray, Place 2 sprays into both nostrils daily as needed  for allergies or rhinitis., Disp: 16 g, Rfl: 5   gabapentin  (NEURONTIN ) 100 MG capsule, Take 1 capsule (100 mg total) by mouth at bedtime., Disp: 90 capsule, Rfl: 3   gabapentin  (NEURONTIN ) 300 MG capsule, Take 300 mg by mouth daily as needed (neuropathy)., Disp: , Rfl:    glucose blood (ACCU-CHEK GUIDE TEST) test strip, Use to test  blood glucose daily, Disp: 300 each, Rfl: 12   glucose blood (ONETOUCH VERIO) test strip, USE TO CHECK BLOOD SUGAR TWO TIMES A DAY, Disp: 100 strip, Rfl: 3   lidocaine  (LIDODERM ) 5 %, APPLY 1 PATCH TO AFFECTED AREA FOR 12 HOURS IN A 24 HOUR PERIOD. DISCARD PATCH WITHIN 12 HOURS OR AS DIRECTED BY MD, Disp: 30 patch, Rfl: 1   montelukast  (SINGULAIR ) 10 MG tablet, Take 1 tablet (10 mg total) by mouth at bedtime., Disp: 30 tablet, Rfl: 5   Multiple Vitamins-Minerals (ZINC PO), Take 1 tablet by mouth daily., Disp: , Rfl:    Pirfenidone  267 MG TABS, Take 2 tablets (534 mg total) by mouth in the morning, at noon, and at bedtime. **low dose as maintenance**, Disp: 540 tablet, Rfl: 0   rosuvastatin  (CRESTOR ) 20 MG tablet, TAKE 1 TABLET BY MOUTH DAILY, Disp: 90 tablet, Rfl: 2   Semaglutide ,0.25 or 0.5MG /DOS, (OZEMPIC , 0.25 OR 0.5 MG/DOSE,) 2 MG/3ML SOPN, 0.5 mg by Other route once a week., Disp: 9 mL, Rfl: 3   valsartan  (DIOVAN ) 160 MG tablet, Take 1 tablet (160 mg total) by mouth daily., Disp: 90 tablet, Rfl: 2   cephALEXin  (KEFLEX ) 250 MG capsule, Take 250 mg by mouth daily. (Patient not taking: Reported on 04/14/2024), Disp: , Rfl:    cephALEXin  (KEFLEX ) 500 MG capsule, Take 500 mg by mouth 3 (three) times daily. (Patient not taking: Reported on 04/14/2024), Disp: , Rfl:    estradiol (ESTRACE) 0.1 MG/GM vaginal cream, Place 1 Applicatorful vaginally at bedtime. (Patient not taking: Reported on 04/14/2024), Disp: , Rfl:       Objective:   Vitals:   04/14/24 0832  BP: 126/72  Pulse: 85  Temp: 99 F (37.2 C)  TempSrc: Oral  SpO2: 98%  Weight: 155 lb (70.3 kg)  Height:  5' 4 (1.626 m)    Estimated body mass index is 26.61 kg/m as calculated from the following:   Height as of this encounter: 5' 4 (1.626 m).   Weight as of this encounter: 155 lb (70.3 kg).  @WEIGHTCHANGE @  [employer]   04/14/24 0832  Weight: 155 lb (70.3 kg)     Physical Exam   General: No distress. Looks well O2 at rest: no Cane present: no Sitting in wheel chair: no Frail: no Obese: yes Neuro: Alert and Oriented x 3. GCS 15. Speech normal Psych: Pleasant Resp:  Barrel Chest - no.  Wheeze - no, Crackles - yes mild base, No overt respiratory distress CVS: Normal heart sounds. Murmurs - no Ext: Stigmata of Connective Tissue Disease - no HEENT: Normal upper airway. PEERL +. No post nasal drip        Assessment/     Assessment & Plan IPF (idiopathic pulmonary fibrosis) (HCC)  DOE (dyspnea on exertion)  Exercise hypoxemia  Need for pneumococcal vaccine    PLAN Patient Instructions  ILD (interstitial lung disease) (HCC) IPF (idiopathic pulmonary fibrosis) (HCC)   - You are doing well with low-dose Esbriet  protocol - Disease is progressive on PFT and CT as of summer 2025 v summer 2025 - PFT 04/14/2024 this visit is suggested continued progression versus development of pulmnary hypertension   Plan - do ECHO, bnp blood work 04/14/2024  - if results suggest pulmonary hypertension, then need Right Heart Cath  - if results are not c/w pumonary hypertension will recommend adding NERANDROMILAST  -Continue low-dose  pirfenidone  per protocol  - Check LFT 04/14/2024 - Continue IPF-pro registry study  - Do spirometry and  DLCO iin 3 months - USe oxygen  with exercise    GERD  Plan  - Address at next visit including Fosamax  use  Seasonal allergies Elevated IgE level House dust mite allergy   -You did see Dr. Jeneal in November 2024.  She recommended that if the cough is not controlled that she would need to do skin allergy  testing and then consider  allergy  shots  Plan   -Per Dr Kerry   [facility] counseling  Plan  - Prevnar 04/14/2024   Follow-up -4-6  weeks-week visit with APP to discuss Right heart cath v Nerandromilst start 12 weeks Dr Geronimo after spiro/dlco    FOLLOWUP    Return for -4-6  weeks-week visit with APP &12 weeks Dr Geronimo after spiro/dlco.  ( Level 05 visit E&M 2024: Estb >= 40 min   visit type: on-site physical face to visit  in total care time and counseling or/and coordination of care by this undersigned MD - Dr Dorethia Geronimo. This includes one or more of the following on this same day 04/14/2024: pre-charting, chart review, note writing, documentation discussion of test results, diagnostic or treatment recommendations, prognosis, risks and benefits of management options, instructions, education, compliance or risk-factor reduction. It excludes time spent by the CMA or office staff in the care of the patient. Actual time 40 min)   SIGNATURE    Dr. Dorethia Geronimo, M.D., F.C.C.P,  Pulmonary and Critical Care Medicine Staff Physician, [facility] Center Director - Interstitial Lung Disease  Program  Pulmonary Fibrosis [facility] at [facility], [place], [address]  Pager: [phone], If no answer or between  15:00h - 7:00h: call [phone] Telephone: [phone]  4:31 PM 04/14/2024

## 2024-04-14 NOTE — Patient Instructions (Addendum)
 ILD (interstitial lung disease) (HCC) IPF (idiopathic pulmonary fibrosis) (HCC)   - You are doing well with low-dose Esbriet  protocol - Disease is progressive on PFT and CT as of summer 2025 v summer 2025 - PFT 04/14/2024 this visit is suggested continued progression versus development of pulmnary hypertension   Plan - do ECHO, bnp blood work 04/14/2024  - if results suggest pulmonary hypertension, then need Right Heart Cath  - if results are not c/w pumonary hypertension will recommend adding NERANDROMILAST  -Continue low-dose  pirfenidone  per protocol  - Check LFT 04/14/2024 - Continue IPF-pro registry study  - Do spirometry and DLCO iin 3 months - USe oxygen  with exercise    GERD  Plan  - Address at next visit including Fosamax  use  Seasonal allergies Elevated IgE level House dust mite allergy   -You did see Dr. Jeneal in November 2024.  She recommended that if the cough is not controlled that she would need to do skin allergy  testing and then consider allergy  shots  Plan   -Per Dr PAdgeett   [facility]ine counseling  Plan  - Prevnar 04/14/2024   Follow-up -4-6  weeks-week visit with APP to discuss Right heart cath v Nerandromilst start 12 weeks Dr Geronimo after spiro/dlco

## 2024-04-18 ENCOUNTER — Encounter: Payer: Self-pay | Admitting: Obstetrics and Gynecology

## 2024-04-18 ENCOUNTER — Ambulatory Visit: Admitting: Obstetrics and Gynecology

## 2024-04-18 ENCOUNTER — Other Ambulatory Visit (HOSPITAL_COMMUNITY)
Admission: RE | Admit: 2024-04-18 | Discharge: 2024-04-18 | Disposition: A | Source: Ambulatory Visit | Attending: Obstetrics and Gynecology | Admitting: Obstetrics and Gynecology

## 2024-04-18 VITALS — BP 128/84 | HR 101 | Ht 65.0 in | Wt 155.0 lb

## 2024-04-18 DIAGNOSIS — N816 Rectocele: Secondary | ICD-10-CM

## 2024-04-18 DIAGNOSIS — N950 Postmenopausal bleeding: Secondary | ICD-10-CM

## 2024-04-18 DIAGNOSIS — Z124 Encounter for screening for malignant neoplasm of cervix: Secondary | ICD-10-CM

## 2024-04-18 DIAGNOSIS — B3731 Acute candidiasis of vulva and vagina: Secondary | ICD-10-CM | POA: Diagnosis not present

## 2024-04-18 LAB — WET PREP FOR TRICH, YEAST, CLUE

## 2024-04-18 MED ORDER — FLUCONAZOLE 150 MG PO TABS: 150.0000 mg | ORAL_TABLET | ORAL | 0 refills | Status: AC

## 2024-04-18 NOTE — Progress Notes (Signed)
 78 y.o. [id number] female with known fibroids here for referral: vaginal bleeding. Widowed.  No LMP recorded. Patient is postmenopausal.   She was referred by urology who is managing hydronephrosis, chronic cystitis, recurrent UTI. At an exam in September she reported pelvic cramping. Pelvic exam done showed some bloody cervical discharge, vaginal atrophy and pelvic organ prolapse.  She was prescribed estradiol cream but has stopped.  Was having some burning and itching used monistat has now stopped. No recent Gyn care.  Urine sample provided: Yes  Last mammogram: 11/10/23 density b, birads 1 neg Sexually active: No   GYN HISTORY: No significant history  OB History  Gravida Para Term Preterm AB Living  3    1 2   SAB IAB Ectopic Multiple Live Births  1    2    # Outcome Date GA Lbr Len/2nd Weight Sex Type Anes PTL Lv  3 Gravida           2 Gravida           1 SAB            Past Medical History:  Diagnosis Date   Alkaline phosphatase deficiency    w/u Ne   Allergic rhinitis    Allergy     Anxiety    Arthritis    Cataract    BILATERAL-REMOVED   DM2 (diabetes mellitus, type 2) (HCC)    GERD (gastroesophageal reflux disease)    Gout    Hemorrhoids    Hyperlipidemia    Hypertension    Interstitial lung disease (HCC)    Mild pulmonary hypertension (HCC)    NSVT (nonsustained ventricular tachycardia) (HCC)    Osteoporosis    PVC's (premature ventricular contractions)    Thyroid  nodule    small   Tubular adenoma of colon 2017   Urticaria    Uterine fibroid    Past Surgical History:  Procedure Laterality Date   CATARACT EXTRACTION Bilateral 2016   COLONOSCOPY  2007   echocardiogram (other)  01/16/2002   POLYPECTOMY     removed tumors from foot nerves  04/1999   stress cardiolite   02/12/2006   TOENAIL EXCISION     TUBAL LIGATION     TYMPANOSTOMY TUBE PLACEMENT     Current Outpatient Medications on File Prior to Visit  Medication Sig Dispense Refill    Accu-Chek Softclix Lancets lancets Use to prick finger to check blood glucose 100 each 12   aspirin  EC 81 MG tablet Take 81 mg by mouth daily.     azelastine  (ASTELIN ) 0.1 % nasal spray Place 2 sprays into both nostrils 2 (two) times daily. 30 mL 5   benzonatate  (TESSALON ) 200 MG capsule Take 1 capsule (200 mg total) by mouth 3 (three) times daily as needed for cough. 30 capsule 1   Blood Glucose Monitoring Suppl (ACCU-CHEK GUIDE) w/Device KIT Use to check blood glucose daily as directed 1 kit 0   Blood Glucose Monitoring Suppl (ONETOUCH VERIO FLEX SYSTEM) w/Device KIT USE TO TEST BLOOD SUGAR DAILY AS DIRECTED 1 kit 0   Calcium  Carb-Cholecalciferol (CALCIUM  + VITAMIN D3 PO) Take 1 tablet by mouth daily.     cephALEXin  (KEFLEX ) 250 MG capsule Take 250 mg by mouth daily.     cetirizine  (ZYRTEC ) 10 MG tablet Take 0.5 tablets (5 mg total) by mouth daily. 30 tablet 5   esomeprazole  (NEXIUM ) 40 MG capsule Take 1 capsule (40 mg total) by mouth 2 (two) times daily before a meal. 180 capsule 1  fesoterodine  (TOVIAZ ) 4 MG TB24 tablet Take 4 mg by mouth daily.     fluticasone  (FLONASE ) 50 MCG/ACT nasal spray Place 2 sprays into both nostrils daily as needed for allergies or rhinitis. 16 g 5   gabapentin  (NEURONTIN ) 100 MG capsule Take 1 capsule (100 mg total) by mouth at bedtime. 90 capsule 3   gabapentin  (NEURONTIN ) 300 MG capsule Take 300 mg by mouth daily as needed (neuropathy).     glucose blood (ACCU-CHEK GUIDE TEST) test strip Use to test blood glucose daily 300 each 12   glucose blood (ONETOUCH VERIO) test strip USE TO CHECK BLOOD SUGAR TWO TIMES A DAY 100 strip 3   lidocaine  (LIDODERM ) 5 % APPLY 1 PATCH TO AFFECTED AREA FOR 12 HOURS IN A 24 HOUR PERIOD. DISCARD PATCH WITHIN 12 HOURS OR AS DIRECTED BY MD 30 patch 1   montelukast  (SINGULAIR ) 10 MG tablet Take 1 tablet (10 mg total) by mouth at bedtime. 30 tablet 5   Multiple Vitamins-Minerals (ZINC PO) Take 1 tablet by mouth daily.     OXYGEN  Inhale  into the lungs. 2 liters     Pirfenidone  267 MG TABS Take 2 tablets (534 mg total) by mouth in the morning, at noon, and at bedtime. **low dose as maintenance** 540 tablet 0   rosuvastatin  (CRESTOR ) 20 MG tablet TAKE 1 TABLET BY MOUTH DAILY 90 tablet 2   Semaglutide ,0.25 or 0.5MG /DOS, (OZEMPIC , 0.25 OR 0.5 MG/DOSE,) 2 MG/3ML SOPN 0.5 mg by Other route once a week. 9 mL 3   valsartan  (DIOVAN ) 160 MG tablet Take 1 tablet (160 mg total) by mouth daily. 90 tablet 2   celecoxib  (CELEBREX ) 200 MG capsule TAKE 1 CAPSULE BY MOUTH DAILY (Patient not taking: Reported on 04/18/2024) 30 capsule 1   estradiol (ESTRACE) 0.1 MG/GM vaginal cream Place 1 Applicatorful vaginally at bedtime. (Patient not taking: Reported on 04/18/2024)     No current facility-administered medications on file prior to visit.   Allergies  Allergen Reactions   Benicar [Olmesartan] Other (See Comments)    Headache       PE Today's Vitals   04/18/24 1337  BP: 128/84  Pulse: (!) 101  SpO2: 96%  Weight: 155 lb (70.3 kg)  Height: 5' 5 (1.651 m)   Body mass index is 25.79 kg/m.  Physical Exam Vitals reviewed. Exam conducted with a chaperone present.  Constitutional:      General: She is not in acute distress.    Appearance: Normal appearance.  HENT:     Head: Normocephalic and atraumatic.     Nose: Nose normal.  Eyes:     Extraocular Movements: Extraocular movements intact.     Conjunctiva/sclera: Conjunctivae normal.  Pulmonary:     Effort: Pulmonary effort is normal.  Genitourinary:    General: Normal vulva.     Exam position: Lithotomy position.     Vagina: Vaginal discharge present.     Cervix: Normal. No cervical motion tenderness, discharge or lesion.     Uterus: Normal. Not enlarged and not tender.      Adnexa: Right adnexa normal and left adnexa normal.     Comments: Grade 1 rectocele, no bleeding Musculoskeletal:        General: Normal range of motion.     Cervical back: Normal range of motion.   Neurological:     General: No focal deficit present.     Mental Status: She is alert.  Psychiatric:        Mood and Affect:  Mood normal.        Behavior: Behavior normal.      Assessment and Plan:        PMB (postmenopausal bleeding) Patient referred by urology for bloody cervical discharge noted at the time of pelvic exam. Patient denies postmenopausal bleeding prior to that exam or since. She was having vaginal itching at the time and self treated with Monistat.  Her symptoms improved. Reviewed causes of vaginal bleeding to include genitourinary syndrome of menopause, infection, trauma, polyps, urinary and GI etiologies, and malignancy.  Abnormal discharge on exam today, however no bleeding noted.  Pelvic exam was otherwise normal. Most likely reason for bloody discharge was vaginitis and possibly trauma. Will collect PAP given referral for cervical bloody discharge. Recommend treatment for yeast vaginitis as outlined below. Also recommend continuation of vaginal estrogen for management of rectocele as outlined below. Patient instructed to follow-up if she observes any vaginal bleeding, as further workup will be indicated at that time.  Yeast vaginitis -     Fluconazole ; Take 1 tablet (150 mg total) by mouth every other day for 2 doses.  Dispense: 2 tablet; Refill: 0 -     WET PREP FOR TRICH, YEAST, CLUE  Cervical cancer screening -     Cytology - PAP  Rectocele Discussed rectocele- can be managed with weight loss, kegel exercises, PFPT, and vaginal estrogen. She elects for vaginal estrogen and kegels.  RTO for annual exam.   Vera LULLA Pa, MD

## 2024-04-19 ENCOUNTER — Ambulatory Visit: Payer: Self-pay | Admitting: Obstetrics and Gynecology

## 2024-04-20 LAB — CYTOLOGY - PAP
Adequacy: ABSENT
Diagnosis: NEGATIVE

## 2024-04-21 ENCOUNTER — Telehealth (HOSPITAL_COMMUNITY): Payer: Self-pay

## 2024-04-21 NOTE — Telephone Encounter (Signed)
 Attempted to call patient regarding pulmonary rehab and confirm if she has Medicaid insurance- no answer, left message. Sent MyChart message.

## 2024-04-24 ENCOUNTER — Telehealth: Payer: Self-pay | Admitting: *Deleted

## 2024-04-24 NOTE — Telephone Encounter (Signed)
 Copied from CRM [id number]. Topic: Clinical - Order For Equipment >> Apr 19, 2024  2:17 PM Isabell A wrote: Reason for CRM: Nidia from [facility] requesting new prescription for oxygen  and office visit notes.  Fax # [phone]  Sonny, please advise.  I do not see a recent walk documented.

## 2024-04-24 NOTE — Telephone Encounter (Signed)
 Pt was not qualified for o2 last visit  She has appt coming up 05/22/24 and I have added to appt notes that she needs o2 recert  No call back number provided for [facility] encounter

## 2024-04-27 ENCOUNTER — Telehealth: Payer: Self-pay | Admitting: *Deleted

## 2024-04-27 NOTE — Telephone Encounter (Signed)
 Patient was notified of results.

## 2024-04-27 NOTE — Telephone Encounter (Signed)
 Patient notified of normal pap result. Patient aware yeast was seen & she was treated for that already.

## 2024-04-27 NOTE — Telephone Encounter (Signed)
 Patient left message on triage requesting call back to review results from recent visit, she does not use MyChart.

## 2024-05-01 ENCOUNTER — Encounter: Payer: Self-pay | Admitting: Radiology

## 2024-05-01 NOTE — Telephone Encounter (Signed)
 Copied from CRM [id number]. Topic: Clinical - Order For Equipment >> May 01, 2024 10:20 AM Essie A wrote: Reason for CRM: Patient received a letter from [facility] to send in another order for oxygen .  Please let patient know when this has been done.  Her phone number is [phone].  Called and notified pt we will re-certify for O2 at upcoming ov 11/24. Pt is aware. Nothing further needed.

## 2024-05-04 ENCOUNTER — Telehealth: Payer: Self-pay | Admitting: Family

## 2024-05-04 NOTE — Telephone Encounter (Signed)
 Spoke with patient and advised cardiologist read the echo prior to sending to ordering MD Patient verbalized understanding  Scheduled follow up from recall that was due although did not look like letter sent to patient

## 2024-05-04 NOTE — Telephone Encounter (Signed)
 Patient called in to inform Reche Finder, NP and Dr. Raford (she would like to establish with Dr. Raford) that pulmonologist, Dr. Geronimo, ordered an echo--she is scheduled for tomorrow, 11/07 10:00 AM. She would like to have echo reviewed by [facility] if possible.

## 2024-05-05 ENCOUNTER — Ambulatory Visit (HOSPITAL_COMMUNITY)
Admission: RE | Admit: 2024-05-05 | Discharge: 2024-05-05 | Disposition: A | Discharge status: Home / Self Care | Source: Ambulatory Visit | Attending: Internal Medicine | Admitting: Internal Medicine

## 2024-05-05 DIAGNOSIS — I10 Essential (primary) hypertension: Secondary | ICD-10-CM | POA: Diagnosis not present

## 2024-05-05 DIAGNOSIS — R0609 Other forms of dyspnea: Secondary | ICD-10-CM | POA: Insufficient documentation

## 2024-05-05 DIAGNOSIS — E785 Hyperlipidemia, unspecified: Secondary | ICD-10-CM | POA: Insufficient documentation

## 2024-05-05 DIAGNOSIS — R0902 Hypoxemia: Secondary | ICD-10-CM | POA: Insufficient documentation

## 2024-05-05 DIAGNOSIS — E119 Type 2 diabetes mellitus without complications: Secondary | ICD-10-CM | POA: Diagnosis not present

## 2024-05-05 DIAGNOSIS — J84112 Idiopathic pulmonary fibrosis: Secondary | ICD-10-CM | POA: Insufficient documentation

## 2024-05-05 LAB — ECHOCARDIOGRAM COMPLETE
Area-P 1/2: 2.1 cm2
S' Lateral: 3.7 cm

## 2024-05-08 ENCOUNTER — Ambulatory Visit: Payer: Self-pay | Admitting: Internal Medicine

## 2024-05-08 NOTE — Telephone Encounter (Signed)
 Pt aware.  Geronimo Amel, MD to [facility]     05/08/24  3:40 PM Mild heart muscle stiffness but otherwise normal  This MyChart message has not been read. ECHOCARDIOGRAM COMPLETE  Copied from CRM #[id number]. Topic: Clinical - Lab/Test Results >> May 08, 2024  9:41 AM Rilla B wrote: Reason for CRM: Patient had an Echo on 11/07 and she calling to Dr Reeves team to know her results.  Please call patient @ [phone].

## 2024-05-08 NOTE — Progress Notes (Signed)
 Mild heart muscle stiffness but otherwise normal

## 2024-05-09 ENCOUNTER — Telehealth (HOSPITAL_COMMUNITY): Payer: Self-pay

## 2024-05-09 NOTE — Telephone Encounter (Signed)
 Ronal Single called and stated she was returning a call. We needed to know if she had Medicaid.  Arelis stated that she did not have Medicaid but that she had special help medicaid with #[id number] R

## 2024-05-11 ENCOUNTER — Telehealth (HOSPITAL_COMMUNITY): Payer: Self-pay

## 2024-05-11 NOTE — Telephone Encounter (Signed)
 The patient has been notified of the result and verbalized understanding.  All questions (if any) were answered. Gladis Porter HERO, LPN [id number]/7974 6:46 PM

## 2024-05-11 NOTE — Telephone Encounter (Signed)
No response from pt in regards to Pulmonary Rehab. Closed referral    

## 2024-05-11 NOTE — Telephone Encounter (Signed)
 Pt requesting a callback from office to explain results from this echo. She is very anxious and worried. Please advise.

## 2024-05-11 NOTE — Telephone Encounter (Signed)
 Please inform patient: echocardiogram with normal heart pumping function. heart muscle mildly stiff which is expected with age. no significant valvular disease. This is a reassuring echocardiogram.   Reche GORMAN Finder, NP

## 2024-05-16 ENCOUNTER — Telehealth: Payer: Self-pay | Admitting: Internal Medicine

## 2024-05-16 DIAGNOSIS — J84112 Idiopathic pulmonary fibrosis: Secondary | ICD-10-CM

## 2024-05-16 DIAGNOSIS — R0609 Other forms of dyspnea: Secondary | ICD-10-CM

## 2024-05-16 DIAGNOSIS — R0902 Hypoxemia: Secondary | ICD-10-CM

## 2024-05-16 NOTE — Telephone Encounter (Signed)
 Copied from CRM [id number]. Topic: Appointments - Scheduling Inquiry for Clinic >> May 16, 2024 11:52 AM Russell PARAS wrote: Reason for CRM:   Pt is contacting clinic regarding scheduling of Pulm Rehab. Reviewed chart and advised the referral was closed due to no pt response. She reports she does not answer calls she doesn't know.  Requested call back to schedule  CB#  [phone], please leave detailed message if she doesn't answer

## 2024-05-17 NOTE — Telephone Encounter (Signed)
 I called and spoke to pt. Pt states she saw Dr Geronimo x1 month ago and he was going to place an order for pulmonary rehab. I checked the ov notes and I did not find anything stating pulmonary rehab however, a referral was made for this and it was stated that pt denied service.   Dr Geronimo, is it okay for me to place another order but urgently for pt?

## 2024-05-18 ENCOUNTER — Encounter: Payer: Self-pay | Admitting: Family Medicine

## 2024-05-18 ENCOUNTER — Ambulatory Visit (INDEPENDENT_AMBULATORY_CARE_PROVIDER_SITE_OTHER): Admitting: Family Medicine

## 2024-05-18 ENCOUNTER — Other Ambulatory Visit: Payer: Self-pay

## 2024-05-18 VITALS — BP 110/70 | HR 99 | Temp 97.7°F

## 2024-05-18 DIAGNOSIS — H1013 Acute atopic conjunctivitis, bilateral: Secondary | ICD-10-CM | POA: Diagnosis not present

## 2024-05-18 DIAGNOSIS — J849 Interstitial pulmonary disease, unspecified: Secondary | ICD-10-CM | POA: Diagnosis not present

## 2024-05-18 DIAGNOSIS — J302 Other seasonal allergic rhinitis: Secondary | ICD-10-CM

## 2024-05-18 DIAGNOSIS — J3089 Other allergic rhinitis: Secondary | ICD-10-CM

## 2024-05-18 NOTE — Progress Notes (Signed)
 He  [address] [place] [address] Dept: [phone]  FOLLOW UP NOTE  Patient ID: Amy Escobar Single, female    DOB: December 03, 1945  Age: 78 y.o. MRN: [id number] Date of Office Visit: 05/18/2024  Assessment  Chief Complaint: Cough and Nasal Congestion  HPI Amy Escobar is a 78 year old female who presents to the clinic for follow-up visit.  She was last seen in this clinic on 02/25/2024 by Dr. Jeneal for evaluation of sore throat for which she received Augmentin .  Prior to that she was evaluated for allergic rhinitis, allergic conjunctivitis, and interstitial lung disease for which she follows Dr. Geronimo and takes low-dosepirfenidone  Discussed the use of AI scribe software for clinical note transcription with the patient, who gave verbal consent to proceed.  History of Present Illness [name] is a 78 year old female who presents with persistent cough and mucus production.  She has been experiencing persistent coughing and mucus production for the past two weeks. The cough is severe enough to cause rib pain and is accompanied by significant mucus production, particularly in the mornings. The mucus is described as white and clear. No fever, sore throat, or exposure to sick contacts. Occasional runny nose, stuffiness, sneezing, and watery eyes are reported.  She takes zinc, vitamin D , and vitamin C supplements regularly. Her current medications include montelukast , Flonase  nasal spray, and Astelin  (azelastine ).  She reports more Flonase  application technique.  Saline nasal rinses are also part of her regimen, although she has not used them recently. She recalls a recent incident of severe coughing, sneezing, and watery eyes while driving, which was distressing. Symptoms persist both inside and outside her home.  She reports that symptoms have actually improved since she made the appointment for nasal congestion and sneezing.  Reflux is reported as well-controlled with  medications, diet and lifestyle modifications.  She works seasonally at [employer] and plans to begin work soon.   Her current medications are listed in the chart.   Drug Allergies:  Allergies  Allergen Reactions   Benicar [Olmesartan] Other (See Comments)    Headache     Physical Exam: BP 110/70   Pulse 99   Temp 97.7 F (36.5 C)   SpO2 96%    Physical Exam Vitals reviewed.  Constitutional:      Appearance: Normal appearance.  HENT:     Head: Normocephalic and atraumatic.     Right Ear: Tympanic membrane normal.     Left Ear: Tympanic membrane normal.     Nose:     Comments: Bilateral nares normal slightly erythematous with thin clear nasal drainage noted.  Pharynx normal.  Ears normal.  Eyes normal.    Mouth/Throat:     Pharynx: Oropharynx is clear.  Eyes:     Conjunctiva/sclera: Conjunctivae normal.  Cardiovascular:     Rate and Rhythm: Normal rate and regular rhythm.     Heart sounds: Normal heart sounds. No murmur heard. Pulmonary:     Effort: Pulmonary effort is normal.     Breath sounds: Normal breath sounds.     Comments: Lungs clear to auscultation Musculoskeletal:        General: Normal range of motion.     Cervical back: Normal range of motion and neck supple.  Skin:    General: Skin is warm.  Neurological:     Mental Status: She is alert and oriented to person, place, and time.  Psychiatric:        Mood and  Affect: Mood normal.        Behavior: Behavior normal.        Thought Content: Thought content normal.        Judgment: Judgment normal.     Assessment and Plan: 1. Seasonal and perennial allergic rhinitis   2. Allergic conjunctivitis of both eyes   3. Interstitial lung disease (HCC)     Patient Instructions  Allergic rhinitis Continue allergen avoidance measures directed toward grass pollen, weed pollen, ragweed pollen, tree pollen, mold, dog, cockroach and tobacco as listed below Continue montelukast  10 mg once a day for allergy   symptom control Continue Flonase  2 sprays in each nostril once a day for nasal congestion.  In the right nostril, point the applicator out toward the right ear. In the left nostril, point the applicator out toward the left ear Continue azelastine  2 sprays in each nostril twice a day if needed for a runny nose Begin saline nasal rinses as needed for nasal symptoms. Use this before any medicated nasal sprays for best result  Allergic conjunctivitis Some over the counter eye drops include Pataday  one drop in each eye once a day as needed for red, itchy eyes OR Zaditor one drop in each eye twice a day as needed for red itchy eyes. Avoid eye drops that say red eye relief as they may contain medications that dry out your eyes.   Reflux Continue dietary lifestyle modifications as listed below Continue to follow up with your GI doctor for evaluation and treatment of reflux  Interstitial lung disease Continue to follow-up with Dr. Geronimo as recommended  Call the clinic if this treatment plan is not working well for you.  Follow up in 3 months or sooner if needed.  Return in about 3 months (around 08/18/2024), or if symptoms worsen or fail to improve.    Thank you for the opportunity to care for this patient.  Please do not hesitate to contact me with questions.  Arlean Mutter, FNP [facility] 

## 2024-05-18 NOTE — Telephone Encounter (Signed)
 Ok to re-refer to rehab

## 2024-05-18 NOTE — Patient Instructions (Addendum)
 Allergic rhinitis Continue allergen avoidance measures directed toward grass pollen, weed pollen, ragweed pollen, tree pollen, mold, dog, cockroach and tobacco as listed below Continue montelukast  10 mg once a day for allergy  symptom control Continue Flonase  2 sprays in each nostril once a day for nasal congestion.  In the right nostril, point the applicator out toward the right ear. In the left nostril, point the applicator out toward the left ear Continue azelastine  2 sprays in each nostril twice a day if needed for a runny nose Begin saline nasal rinses as needed for nasal symptoms. Use this before any medicated nasal sprays for best result  Allergic conjunctivitis Some over the counter eye drops include Pataday  one drop in each eye once a day as needed for red, itchy eyes OR Zaditor one drop in each eye twice a day as needed for red itchy eyes. Avoid eye drops that say red eye relief as they may contain medications that dry out your eyes.   Reflux Continue dietary lifestyle modifications as listed below Continue to follow up with your GI doctor for evaluation and treatment of reflux  Interstitial lung disease Continue to follow-up with Dr. Geronimo as recommended  Call the clinic if this treatment plan is not working well for you.  Follow up in 3 months or sooner if needed.  Reducing Pollen Exposure The [employer] , [employer] suggests the following steps to reduce your exposure to pollen during allergy  seasons. Do not hang sheets or clothing out to dry; pollen may collect on these items. Do not mow lawns or spend time around freshly cut grass; mowing stirs up pollen. Keep windows closed at night.  Keep car windows closed while driving. Minimize morning activities outdoors, a time when pollen counts are usually at their highest. Stay indoors as much as possible when pollen counts or humidity is high and on windy days when pollen tends to remain in the air  longer. Use air conditioning when possible.  Many air conditioners have filters that trap the pollen spores. Use a HEPA room air filter to remove pollen form the indoor air you breathe.  Control of Mold Allergen Mold and fungi can grow on a variety of surfaces provided certain temperature and moisture conditions exist.  Outdoor molds grow on plants, decaying vegetation and soil.  The major outdoor mold, Alternaria and Cladosporium, are found in very high numbers during hot and dry conditions.  Generally, a late Summer - Fall peak is seen for common outdoor fungal spores.  Rain will temporarily lower outdoor mold spore count, but counts rise rapidly when the rainy period ends.  The most important indoor molds are Aspergillus and Penicillium.  Dark, humid and poorly ventilated basements are ideal sites for mold growth.  The next most common sites of mold growth are the bathroom and the kitchen.  Outdoor [employer] Use air conditioning and keep windows closed Avoid exposure to decaying vegetation. Avoid leaf raking. Avoid grain handling. Consider wearing a face mask if working in moldy areas.  Indoor Mold Control Maintain humidity below 50%. Clean washable surfaces with 5% bleach solution. Remove sources e.g. Contaminated carpets.  Control of Dog or Cat Allergen Avoidance is the best way to manage a dog or cat allergy . If you have a dog or cat and are allergic to dog or cats, consider removing the dog or cat from the home. If you have a dog or cat but don't want to find it a new home, or if  your family wants a pet even though someone in the household is allergic, here are some strategies that may help keep symptoms at bay:  Keep the pet out of your bedroom and restrict it to only a few rooms. Be advised that keeping the dog or cat in only one room will not limit the allergens to that room. Don't pet, hug or kiss the dog or cat; if you do, wash your hands with soap and water. High-efficiency  particulate air (HEPA) cleaners run continuously in a bedroom or living room can reduce allergen levels over time. Regular use of a high-efficiency vacuum cleaner or a central vacuum can reduce allergen levels. Giving your dog or cat a bath at least once a week can reduce airborne allergen.  Control of Cockroach Allergen Cockroach allergen has been identified as an important cause of acute attacks of asthma, especially in urban settings.  There are fifty-five species of cockroach that exist in the [place] , however only three, the American, German and Oriental species produce allergen that can affect patients with Asthma.  Allergens can be obtained from fecal particles, egg casings and secretions from cockroaches.    Remove food sources. Reduce access to water. Seal access and entry points. Spray runways with 0.5-1% Diazinon or Chlorpyrifos Blow boric acid power under stoves and refrigerator. Place bait stations (hydramethylnon) at feeding sites.

## 2024-05-19 NOTE — Telephone Encounter (Signed)
Referral placed- pt is aware

## 2024-05-22 ENCOUNTER — Ambulatory Visit (INDEPENDENT_AMBULATORY_CARE_PROVIDER_SITE_OTHER): Admitting: Primary Care

## 2024-05-22 ENCOUNTER — Ambulatory Visit: Admitting: *Deleted

## 2024-05-22 ENCOUNTER — Encounter: Payer: Self-pay | Admitting: Primary Care

## 2024-05-22 VITALS — BP 128/68 | HR 104 | Temp 97.3°F | Ht 64.5 in | Wt 155.8 lb

## 2024-05-22 DIAGNOSIS — Z87891 Personal history of nicotine dependence: Secondary | ICD-10-CM

## 2024-05-22 DIAGNOSIS — R0902 Hypoxemia: Secondary | ICD-10-CM

## 2024-05-22 DIAGNOSIS — J84112 Idiopathic pulmonary fibrosis: Secondary | ICD-10-CM

## 2024-05-22 DIAGNOSIS — I5189 Other ill-defined heart diseases: Secondary | ICD-10-CM

## 2024-05-22 DIAGNOSIS — R058 Other specified cough: Secondary | ICD-10-CM | POA: Diagnosis not present

## 2024-05-22 DIAGNOSIS — R0609 Other forms of dyspnea: Secondary | ICD-10-CM

## 2024-05-22 DIAGNOSIS — J3089 Other allergic rhinitis: Secondary | ICD-10-CM | POA: Diagnosis not present

## 2024-05-22 DIAGNOSIS — J849 Interstitial pulmonary disease, unspecified: Secondary | ICD-10-CM

## 2024-05-22 LAB — PULMONARY FUNCTION TEST
DL/VA % pred: 78 %
DL/VA: 3.19 ml/min/mmHg/L
DLCO cor % pred: 33 %
DLCO cor: 6.28 ml/min/mmHg
DLCO unc % pred: 32 %
DLCO unc: 6.14 ml/min/mmHg
FEF 25-75 Pre: 1.82 L/s
FEF2575-%Pred-Pre: 120 %
FEV1-%Pred-Pre: 73 %
FEV1-Pre: 1.47 L
FEV1FVC-%Pred-Pre: 110 %
FEV6-%Pred-Pre: 69 %
FEV6-Pre: 1.77 L
FEV6FVC-%Pred-Pre: 105 %
FVC-%Pred-Pre: 66 %
FVC-Pre: 1.8 L
Pre FEV1/FVC ratio: 82 %
Pre FEV6/FVC Ratio: 100 %

## 2024-05-22 LAB — COMPREHENSIVE METABOLIC PANEL WITH GFR
ALT: 25 U/L (ref 0–35)
AST: 22 U/L (ref 0–37)
Albumin: 4.1 g/dL (ref 3.5–5.2)
Alkaline Phosphatase: 87 U/L (ref 39–117)
BUN: 15 mg/dL (ref 6–23)
CO2: 30 meq/L (ref 19–32)
Calcium: 9.4 mg/dL (ref 8.4–10.5)
Chloride: 101 meq/L (ref 96–112)
Creatinine, Ser: 0.82 mg/dL (ref 0.40–1.20)
GFR: 68.32 mL/min (ref >60.00)
Glucose, Bld: 92 mg/dL (ref 70–99)
Potassium: 4.4 meq/L (ref 3.5–5.1)
Sodium: 137 meq/L (ref 135–145)
Total Bilirubin: 0.4 mg/dL (ref 0.2–1.2)
Total Protein: 7.4 g/dL (ref 6.0–8.3)

## 2024-05-22 LAB — CBC WITH DIFFERENTIAL/PLATELET
Basophils Absolute: 0 K/uL (ref 0.0–0.1)
Basophils Relative: 0.4 % (ref 0.0–3.0)
Eosinophils Absolute: 0.1 K/uL (ref 0.0–0.7)
Eosinophils Relative: 1.5 % (ref 0.0–5.0)
HCT: 38.5 % (ref 36.0–46.0)
Hemoglobin: 12.7 g/dL (ref 12.0–15.0)
Lymphocytes Relative: 29.2 % (ref 12.0–46.0)
Lymphs Abs: 2.4 K/uL (ref 0.7–4.0)
MCHC: 33 g/dL (ref 30.0–36.0)
MCV: 83.5 fl (ref 78.0–100.0)
Monocytes Absolute: 0.9 K/uL (ref 0.1–1.0)
Monocytes Relative: 10.7 % (ref 3.0–12.0)
Neutro Abs: 4.8 K/uL (ref 1.4–7.7)
Neutrophils Relative %: 58.2 % (ref 43.0–77.0)
Platelets: 187 K/uL (ref 150.0–400.0)
RBC: 4.61 Mil/uL (ref 3.87–5.11)
RDW: 14.4 % (ref 11.5–15.5)
WBC: 8.2 K/uL (ref 4.0–10.5)

## 2024-05-22 LAB — BRAIN NATRIURETIC PEPTIDE: Pro B Natriuretic peptide (BNP): 40 pg/mL (ref 0.0–100.0)

## 2024-05-22 NOTE — Progress Notes (Signed)
 @Patient  ID: Amy Escobar, female    DOB: 1945-12-08, 78 y.o.   MRN: [id number]  No chief complaint on file.   Referring provider: No ref. provider found  HPI: 56 year olf female, former smoker. PMH upper airway cough, URI, seasonal allergic rhinitis, HTN, mild pulmonary hypertension. Former patient of both Dr. Darlean and Dr. Shellia, established with Dr. Geronimo in October 2024 as a new patient.   Previous [facility] pulmonary encounter:  07/05/2023 Discussed the use of AI scribe software for clinical note transcription with the patient, who gave verbal consent to proceed.  Patient was given a provisional diagnosis of IPF based on age, disease prodominantly in lower lobes (probably UIP) and normal serology. She was started on pirfenidone  in October. Liver function was slightly elevated in December, medication was stopped per Dr. Geronimo and restarted at lower dose. The patient reported experiencing dizziness and feeling stoned while on the medication, to the point of being unable to drive. Patient notified our office on 06/14/2023 with reports of bronchitis symptoms with increased shortness of breath. She was admitted for IPF flare vs PNA. She was discharged on Augmentin  which she has completed and has 1 day left of prednisone  as well at home oxygen  at 2L. Respiratory-wise, the patient denies any current breathing difficulties or shortness of breath. They reported a very minimal cough with occasional clear mucus production, and no wheezing or chest tightness. They also mentioned having acid reflux, which is exacerbated by certain foods.  The patient also has a bladder problem, for which they take Toviaz . They reported that they have been waking up to urinate and have been experiencing urinary urgency. They also mentioned that they have been having issues with emptying their bladder completely. The patient was on a daily antibiotic, Keflex , for urinary tract infection prevention, but it was stopped  during their hospital stay due to a potential interaction with the Pirfenidone .       04/14/2024 -   Chief Complaint  Patient presents with   Interstitial Lung Disease    PFT F/U Pt states since LOV breathing has been okay SOB w/ exertion depending on what patient is doing  Prod cough ( phlegm white)    #IPF diagnosed given October 2024 and started pirfenidone  ( -Giving a provisional diagnosis of idiopathic pulmonary fibrosis [IPF].  This is based on age greater than 26, disease predominantly in the lower lobe, probable UIP description on the CT scan, progression since 2020 [he had normal pulmonary function test in 2015], and near normal serology blood work  -As of March 2025 on low-dose pirfenidone  protocol  -Trace positive rheumatoid factor in July 2024  #CT imaging   - last HRCT April 2025 with progression since 2024  #ECHO  - last dec 2024  # Significant elevation in IgE  -2300in  2021 and 1600 in July 2024  -Positive for dog dander, Bermuda grass cockroach ragweed dust mite and different trees\  -Seen by Dr. Jeneal allergist and fall 2024.  #Hospitalization for pneumonia respiratory failure in December 2024  #Esbriet /Pirfenidone  requires intensive drug monitoring due to high concerns for Adverse effects of , including  Drug Induced Liver Injury, significant GI side effects that include but not limited to Diarrhea, Nausea, Vomiting,  and other system side effects that include Fatigue, headaches, weight loss and other side effects such as skin rash. These will be monitored with  blood work such as LFT initially once a month for 6 months and then quarterly   -  Intolerance to higher dose pirfenidone   - Low-dose pirfenidone  since late 2024/early 2025  HPI Amy Escobar 78 y.o. -presents for follow-up.  IPF progressive phenotype.  Presents with Amy Escobar the daughter.  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  Since last seeing her in June 2020 for she is dealing with  bladder and vaginal prolapse.  Other than that she is not having any medical issues.  No ER visits no hospitalizations no surgeries.  She feels she is stable.  She is tolerating low-dose pirfenidone  quite well.  Her exercise hypoxemia test seems adequate but his symptom scores are either range bound or getting worse.  She had pulmonary function test and the DLCO relative to the [facility] shows significant decline raising the possibility of pulmonary hypertension.    05/22/2024- Interim hx  Summary: Progressive IPF type  Tolerating low dose pirfenidone  Continue IPF-pro registry  Continue oxygen  with exercise  Decline in FVC and DLCO raising concern for PH  Ordered for echo if results suggest PH need right heart cath, not no c/w PH will recomm adding Jascayd and continue low dose Pirfenidone  per protocol Check LFTs in October  Right heart cath vs Jascayd  Elevated IGE level, allergy  to dist mites. If cough not controlled consider allergy  shots    Discussed the use of AI scribe software for clinical note transcription with the patient, who gave verbal consent to proceed.  History of Present Illness Amy Escobar is a 78 year old female with idiopathic pulmonary fibrosis who presents for a follow-up visit.  Diagnosed with idiopathic pulmonary fibrosis in October 2024, she started on pirfenidone  to slow disease progression. Despite treatment, her condition has shown some progression, as evidenced by her last CT scan in April 2025. She tolerates pirfenidone  well without nausea, vomiting, diarrhea, or weight loss. She is part of the IPF pro registry program and uses oxygen  during exercise and at bedtime, although she does not engage in regular physical activity. She would like referral to return to pulmonary rehab.   An echocardiogram on October 17th showed grade one diastolic dysfunction. There was no evidence of pulmonary hypertension or pericardial effusion. She experiences no shortness of breath at  rest, while changing clothes, eating, or showering, but has mild shortness of breath when walking at her own pace and significant shortness of breath when climbing stairs.  She has elevated IgE levels and an allergy  to dust mites, for which she takes montelukast , cetirizine , and uses a nasal spray called Astelin . She also uses Tessalon  Perles for cough management. Her cough is currently manageable, though she experiences it more in the mornings and during specific tests. She also takes a mullein leaf supplement.  She reports occasional depression when alone, rating it as a one on a scale of severity. She experiences fatigue, rating it as a three or four, but denies any chronic pain, nausea, vomiting, or significant diarrhea, attributing a recent episode of diarrhea to food intake. She uses oxygen  at night and reports no issues with insurance coverage for her oxygen  supply.   Pulmonary function testing 05/22/2024 >> FVC 1.80 (66%), FEV1 1.47 (73%), DLCOunc 6.14 (32%)  SYMPTOM SCALE - ILD 04/20/2023 Recoemed esbreit after this visit 09/13/2023 Low dose esbiret 12/10/2023 :pw dose esbriet  protocl  + portable o2 04/14/2024 Low dose esbriet  05/22/2024   Current weight       O2 use ra ra ra  RA  Shortness of Breath 0 -> 5 scale with 5 being worst (score 6 If  unable to do)      At rest 0 0 0 0 0  Simple tasks - showers, clothes change, eating, shaving 0 2 1 3  1.5- changing clothes   Household (dishes, doing bed, laundry) 2 3 4 2 3   Shopping 3 4 1 2 1   Walking level at own pace 3 3 3.5 3 2   Walking up Stairs 5 0 1 5 4   Total (30-36) Dyspnea Score 18 11 10.5 15   How bad is your cough? 2 5 2  - better with Mullein in tea 0 3  How bad is your fatigue 3 4 4  0 3  How bad is nausea 0 0 2 0 0  How bad is vomiting?  0 0 0 0 0  How bad is diarrhea? 0 0 0 0 1- food related   How bad is anxiety? 0 2 0 0 0  How bad is depression 0 2 0 0 1- lonely   Any chronic pain - if so where and how bad 0 0 0 0 0      Simple office walk 224 (66+46 x 2) feet Pod A at [facility] x  3 laps goal with forehead probe 09/13/2023  12/10/2023  04/14/2024  05/22/2024  O2 used ra ra  RA  Number laps completed 3 laos 3 laps but stopped at 2  3 labs   Comments about pace Mod pac Modated pace  Moderate pace   Resting Pulse Ox/HR 100% and 90/min 98% and HR 88  98%   Final Pulse Ox/HR 93% and 102/min 88% at 2 laps  HR 89    Desaturated </= 88% no yes  No  Desaturated <= 3% points yes yes  No   Got Tachycardic >/= 90/min yes no    Symptoms at end of test No dyspnea DYSPNEIC +    Miscellaneous comments x Corrected 2L [place] statyed at 97%      Allergies  Allergen Reactions   Benicar [Olmesartan] Other (See Comments)    Headache     Immunization History  Administered Date(s) Administered   Fluad Quad(high Dose 65+) 04/18/2019   INFLUENZA, HIGH DOSE SEASONAL PF 03/11/2023, 03/07/2024   Influenza Split 03/23/2011, 04/07/2013   Influenza Whole 04/13/2008, 04/29/2009, 03/29/2010, 03/29/2018   Influenza-Unspecified 04/08/2012, 03/28/2014, 03/14/2015, 03/13/2016, 04/15/2020   PFIZER(Purple Top)SARS-COV-2 Vaccination 08/13/2019, 09/05/2019, 04/15/2020   PNEUMOCOCCAL CONJUGATE-20 04/14/2024   PPD Test 03/23/2011   Pfizer Covid-19 Vaccine Bivalent Booster 34yrs & up 04/29/2021   Pneumococcal Conjugate-13 01/10/2014   Pneumococcal Polysaccharide-23 07/07/2010, 03/31/2018   Td 12/27/2001   Tdap 08/06/2014   Zoster Recombinant(Shingrix) 03/31/2018    Past Medical History:  Diagnosis Date   Alkaline phosphatase deficiency    w/u Ne   Allergic rhinitis    Allergy     Anxiety    Arthritis    Cataract    BILATERAL-REMOVED   DM2 (diabetes mellitus, type 2) (HCC)    GERD (gastroesophageal reflux disease)    Gout    Hemorrhoids    Hyperlipidemia    Hypertension    Interstitial lung disease (HCC)    Mild pulmonary hypertension (HCC)    NSVT (nonsustained ventricular tachycardia) (HCC)    Osteoporosis    PVC's  (premature ventricular contractions)    Thyroid  nodule    small   Tubular adenoma of colon 2017   Urticaria    Uterine fibroid     Tobacco History: Social History   Tobacco Use  Smoking Status Former   Current packs/day:  0.00   Average packs/day: 0.3 packs/day for 5.0 years (1.3 ttl pk-yrs)   Types: Cigarettes   Start date: 06/30/1963   Quit date: 06/29/1968   Years since quitting: 55.9  Smokeless Tobacco Never   Counseling given: Not Answered   Outpatient Medications Prior to Visit  Medication Sig Dispense Refill   Accu-Chek Softclix Lancets lancets Use to prick finger to check blood glucose 100 each 12   aspirin  EC 81 MG tablet Take 81 mg by mouth daily.     azelastine  (ASTELIN ) 0.1 % nasal spray Place 2 sprays into both nostrils 2 (two) times daily. 30 mL 5   benzonatate  (TESSALON ) 200 MG capsule Take 1 capsule (200 mg total) by mouth 3 (three) times daily as needed for cough. 30 capsule 1   Blood Glucose Monitoring Suppl (ACCU-CHEK GUIDE) w/Device KIT Use to check blood glucose daily as directed 1 kit 0   Blood Glucose Monitoring Suppl (ONETOUCH VERIO FLEX SYSTEM) w/Device KIT USE TO TEST BLOOD SUGAR DAILY AS DIRECTED 1 kit 0   Calcium  Carb-Cholecalciferol (CALCIUM  + VITAMIN D3 PO) Take 1 tablet by mouth daily.     celecoxib  (CELEBREX ) 200 MG capsule TAKE 1 CAPSULE BY MOUTH DAILY 30 capsule 1   cephALEXin  (KEFLEX ) 250 MG capsule Take 250 mg by mouth daily.     cetirizine  (ZYRTEC ) 10 MG tablet Take 0.5 tablets (5 mg total) by mouth daily. 30 tablet 5   esomeprazole  (NEXIUM ) 40 MG capsule Take 1 capsule (40 mg total) by mouth 2 (two) times daily before a meal. 180 capsule 1   estradiol (ESTRACE) 0.1 MG/GM vaginal cream Place 1 Applicatorful vaginally at bedtime.     fesoterodine  (TOVIAZ ) 4 MG TB24 tablet Take 4 mg by mouth daily.     fluticasone  (FLONASE ) 50 MCG/ACT nasal spray Place 2 sprays into both nostrils daily as needed for allergies or rhinitis. 16 g 5   gabapentin   (NEURONTIN ) 100 MG capsule Take 1 capsule (100 mg total) by mouth at bedtime. 90 capsule 3   gabapentin  (NEURONTIN ) 300 MG capsule Take 300 mg by mouth daily as needed (neuropathy).     glucose blood (ACCU-CHEK GUIDE TEST) test strip Use to test blood glucose daily 300 each 12   glucose blood (ONETOUCH VERIO) test strip USE TO CHECK BLOOD SUGAR TWO TIMES A DAY 100 strip 3   lidocaine  (LIDODERM ) 5 % APPLY 1 PATCH TO AFFECTED AREA FOR 12 HOURS IN A 24 HOUR PERIOD. DISCARD PATCH WITHIN 12 HOURS OR AS DIRECTED BY MD 30 patch 1   montelukast  (SINGULAIR ) 10 MG tablet Take 1 tablet (10 mg total) by mouth at bedtime. 30 tablet 5   Multiple Vitamins-Minerals (ZINC PO) Take 1 tablet by mouth daily.     OXYGEN  Inhale into the lungs. 2 liters     Pirfenidone  267 MG TABS Take 2 tablets (534 mg total) by mouth in the morning, at noon, and at bedtime. **low dose as maintenance** 540 tablet 0   rosuvastatin  (CRESTOR ) 20 MG tablet TAKE 1 TABLET BY MOUTH DAILY 90 tablet 2   Semaglutide ,0.25 or 0.5MG /DOS, (OZEMPIC , 0.25 OR 0.5 MG/DOSE,) 2 MG/3ML SOPN 0.5 mg by Other route once a week. 9 mL 3   valsartan  (DIOVAN ) 160 MG tablet Take 1 tablet (160 mg total) by mouth daily. 90 tablet 2   No facility-administered medications prior to visit.      Review of Systems  Review of Systems  Constitutional:  Positive for fatigue.  HENT: Negative.  Respiratory:  Positive for cough.        DOE  Cardiovascular: Negative.    Physical Exam  There were no vitals taken for this visit. Physical Exam Constitutional:      Appearance: Normal appearance. She is well-developed.  HENT:     Head: Normocephalic and atraumatic.     Mouth/Throat:     Mouth: Mucous membranes are moist.     Pharynx: Oropharynx is clear.  Eyes:     Pupils: Pupils are equal, round, and reactive to light.  Cardiovascular:     Rate and Rhythm: Normal rate and regular rhythm.     Heart sounds: Normal heart sounds. No murmur heard. Pulmonary:      Effort: Pulmonary effort is normal. No respiratory distress.     Breath sounds: Normal breath sounds. No wheezing or rhonchi.     Comments: Faint rales at bases, otherwise clear  O2 98% on RA Musculoskeletal:        General: Normal range of motion.     Cervical back: Normal range of motion and neck supple.  Skin:    General: Skin is warm and dry.     Findings: No erythema or rash.  Neurological:     General: No focal deficit present.     Mental Status: She is alert and oriented to person, place, and time. Mental status is at baseline.  Psychiatric:        Mood and Affect: Mood normal.        Behavior: Behavior normal.        Thought Content: Thought content normal.        Judgment: Judgment normal.      Lab Results:  CBC    Component Value Date/Time   WBC 8.6 03/07/2024 0939   RBC 4.79 03/07/2024 0939   HGB 13.2 03/07/2024 0939   HGB 12.8 01/21/2023 0950   HCT 40.6 03/07/2024 0939   HCT 41.2 01/21/2023 0950   PLT 197.0 03/07/2024 0939   PLT 227 01/21/2023 0950   MCV 84.9 03/07/2024 0939   MCV 85 01/21/2023 0950   MCH 27.1 06/18/2023 0244   MCHC 32.5 03/07/2024 0939   RDW 14.7 03/07/2024 0939   RDW 12.9 01/21/2023 0950   LYMPHSABS 2.6 03/07/2024 0939   LYMPHSABS 3.2 (H) 01/21/2023 0950   MONOABS 0.9 03/07/2024 0939   EOSABS 0.2 03/07/2024 0939   EOSABS 0.1 01/21/2023 0950   BASOSABS 0.0 03/07/2024 0939   BASOSABS 0.0 01/21/2023 0950    BMET    Component Value Date/Time   NA 140 03/07/2024 0939   NA 143 01/21/2023 0950   K 4.0 03/07/2024 0939   CL 103 03/07/2024 0939   CO2 30 03/07/2024 0939   GLUCOSE 78 03/07/2024 0939   BUN 11 03/07/2024 0939   BUN 19 01/21/2023 0950   CREATININE 0.83 03/07/2024 0939   CREATININE 0.76 02/13/2020 1008   CALCIUM  9.5 03/07/2024 0939   GFRNONAA >60 06/18/2023 0244   GFRAA 100 08/16/2020 0748    BNP    Component Value Date/Time   BNP 76.5 06/15/2023 1352    ProBNP    Component Value Date/Time   PROBNP 43.0  04/14/2024 0914    Imaging: ECHOCARDIOGRAM COMPLETE Result Date: 05/05/2024    ECHOCARDIOGRAM REPORT   Patient Name:   Amy Escobar Date of Exam: 05/05/2024 Medical Rec #:  [id number]        Height:       65.0 in Accession #:  [id number]       Weight:       155.0 lb Date of Birth:  [birth year]        BSA:          1.775 m Patient Age:    78 years         BP:           154/94 mmHg Patient Gender: F                HR:           77 bpm. Exam Location:  Outpatient Procedure: 2D Echo, Cardiac Doppler and Color Doppler (Both Spectral and Color            Flow Doppler were utilized during procedure). Indications:    Dyspnea R06.00  History:        Patient has prior history of Echocardiogram examinations, most                 recent 03/20/2022. Risk Factors:Diabetes, Dyslipidemia and                 Hypertension.  Sonographer:    Tinnie Gosling RDCS Referring Phys: [id number] MURALI RAMASWAMY IMPRESSIONS  1. Left ventricular ejection fraction, by estimation, is 55 to 60%. The left ventricle has normal function. The left ventricle has no regional wall motion abnormalities. There is mild left ventricular hypertrophy. Left ventricular diastolic parameters are consistent with Grade I diastolic dysfunction (impaired relaxation).  2. Right ventricular systolic function is normal. The right ventricular size is normal.  3. Left atrial size was mildly dilated.  4. The mitral valve is normal in structure. Trivial mitral valve regurgitation. No evidence of mitral stenosis.  5. The aortic valve is tricuspid. Aortic valve regurgitation is not visualized. No aortic stenosis is present.  6. The inferior vena cava is normal in size with greater than 50% respiratory variability, suggesting right atrial pressure of 3 mmHg. FINDINGS  Left Ventricle: Left ventricular ejection fraction, by estimation, is 55 to 60%. The left ventricle has normal function. The left ventricle has no regional wall motion abnormalities. The left ventricular internal  cavity size was normal in size. There is  mild left ventricular hypertrophy. Left ventricular diastolic parameters are consistent with Grade I diastolic dysfunction (impaired relaxation). Right Ventricle: The right ventricular size is normal. Right ventricular systolic function is normal. Left Atrium: Left atrial size was mildly dilated. Right Atrium: Right atrial size was normal in size. Pericardium: There is no evidence of pericardial effusion. Mitral Valve: The mitral valve is normal in structure. Mild mitral annular calcification. Trivial mitral valve regurgitation. No evidence of mitral valve stenosis. Tricuspid Valve: The tricuspid valve is normal in structure. Tricuspid valve regurgitation is mild . No evidence of tricuspid stenosis. Aortic Valve: The aortic valve is tricuspid. Aortic valve regurgitation is not visualized. No aortic stenosis is present. Pulmonic Valve: The pulmonic valve was normal in structure. Pulmonic valve regurgitation is not visualized. No evidence of pulmonic stenosis. Aorta: The aortic root is normal in size and structure. Venous: The inferior vena cava is normal in size with greater than 50% respiratory variability, suggesting right atrial pressure of 3 mmHg. IAS/Shunts: No atrial level shunt detected by color flow Doppler.  LEFT VENTRICLE PLAX 2D LVIDd:         4.90 cm   Diastology LVIDs:         3.70 cm   LV e' medial:    4.24 cm/s LV PW:  1.20 cm   LV E/e' medial:  10.0 LV IVS:        1.20 cm   LV e' lateral:   8.16 cm/s LVOT diam:     2.10 cm   LV E/e' lateral: 5.2 LV SV:         42 LV SV Index:   23 LVOT Area:     3.46 cm LV IVRT:       166 msec  RIGHT VENTRICLE             IVC RV S prime:     11.50 cm/s  IVC diam: 1.60 cm TAPSE (M-mode): 1.7 cm LEFT ATRIUM             Index        RIGHT ATRIUM           Index LA diam:        3.70 cm 2.08 cm/m   RA Area:     13.80 cm LA Vol (A2C):   46.1 ml 25.97 ml/m  RA Volume:   32.40 ml  18.25 ml/m LA Vol (A4C):   69.1 ml 38.93  ml/m LA Biplane Vol: 61.0 ml 34.37 ml/m  AORTIC VALVE LVOT Vmax:   65.50 cm/s LVOT Vmean:  42.600 cm/s LVOT VTI:    0.120 m  AORTA Ao Root diam: 2.90 cm Ao Asc diam:  3.30 cm MITRAL VALVE MV Area (PHT): 2.10 cm    SHUNTS MV Decel Time: 361 msec    Systemic VTI:  0.12 m MV E velocity: 42.40 cm/s  Systemic Diam: 2.10 cm MV A velocity: 85.90 cm/s MV E/A ratio:  0.49 Redell Shallow MD Electronically signed by Redell Shallow MD Signature Date/Time: 05/05/2024/12:18:47 PM    Final      Assessment & Plan:    1. Interstitial lung disease (HCC) (Primary) - Ambulatory referral to Pharmacotherapy Clinic - AMB referral to pulmonary rehabilitation  2. Upper airway cough syndrome  Assessment and Plan Assessment & Plan Idiopathic pulmonary fibrosis  Progression of idiopathic pulmonary fibrosis since starting pirfenidone . Moderate restriction with severe diffusion defect. 20% decline in diffusion capacity since July 2024. Echocardiogram shows no evidence of pulmonary hypertension. Tolerating low-dose pirfenidone  without significant side effects. Considering addition of Jascayd due to IPF progression. Discussed potential side effects of Jascayd, including fatigue, headache, back pain , dizziness, nausea, vomiting, diarrhea, and constipation, which may be exacerbated when combined with pirfenidone . Coverage for Jascayd needs to be confirmed with pharmacy team. Oxygen  levels remains >98% RA during simple walk test.  - Continue low dose pirfenidone  as directed  - Referred to pharmacy team for coverage of Jascayd - Referred to pulmonary rehabilitation. - Order overnight oximetry test to qualify for oxygen  use - Checking CMET to monitor liver function and CBC to correlate with diffusion capacity   Vitals:   05/22/24 0851 05/22/24 0852 05/22/24 0853 05/22/24 0854  BP:      Pulse: 98 (!) 101 (!) 109 (!) 104  Temp:      Height:      Weight:      SpO2: 100% Comment: ra, resting 99% Comment: exertion, ra, lap  1 98% Comment: ra, exertion, lap 2 98% Comment: ra, exertion, lap 3  BMI (Calculated):         Grade 1 diastolic dysfunction of left ventricle Echocardiogram shows grade 1 diastolic dysfunction, indicating impaired relaxation of the left ventricle. Generally not acutely concerning and considered normal with age. No evidence of pulmonary hypertension or  pericardial effusion.  Allergic rhinitis due to dust mite allergy  with chronic cough Chronic cough managed with montelukast , cetirizine , Astelin  nasal spray, and Tessalon  Perles. Elevated IgE levels and allergy  to dust mites. Cough is currently manageable. Mullein leaf supplement is used as recommended by Dr. Geronimo. - Continue current allergy  management regimen.  Recording duration: 20 minutes  I personally spent a total of 40 minutes in the care of the patient today including counseling and educating, placing orders, documenting clinical information in the EHR, and independently interpreting results.   Almarie LELON Ferrari, NP 05/22/2024

## 2024-05-22 NOTE — Patient Instructions (Signed)
 Spirometry and diffusion capacity performed today.

## 2024-05-22 NOTE — Progress Notes (Signed)
 Spirometry and diffusion capacity performed today.

## 2024-05-22 NOTE — Patient Instructions (Addendum)
  VISIT SUMMARY: Today, you came in for a follow-up visit to discuss your idiopathic pulmonary fibrosis and other health concerns. We reviewed your current medications, recent test results, and discussed potential changes to your treatment plan.  YOUR PLAN: -IDIOPATHIC PULMONARY FIBROSIS: Idiopathic pulmonary fibrosis is a lung disease that causes scarring of the lung tissue, leading to breathing difficulties. Your condition has shown some progression despite being on pirfenidone . We discussed adding a new medication, Jeskade, to your treatment plan, but we need to confirm coverage with the pharmacy team. You will continue with pirfenidone  and have been referred to pulmonary rehabilitation. We may also consider an overnight oximetry test to qualify you for oxygen  use at night.  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 Your echocardiogram showed no signs of pulmonary hypertension or pericardial effusion.  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 Your chronic cough is currently manageable with montelukast , cetirizine , Astelin  nasal spray, and Tessalon  Perles. You should continue with your current allergy  management regimen.  -DEPRESSIVE SYMPTOMS: You have occasional mild depressive symptoms, especially when alone. We will continue to monitor these symptoms.  INSTRUCTIONS: Please continue taking your current medications as prescribed. We will check with the pharmacy team about coverage for Jeskade and will inform you of the next steps. You have been referred to pulmonary rehabilitation, and we may consider an overnight oximetry test to see if you qualify for oxygen  use at night. Continue to monitor your  depressive symptoms and let us  know if they worsen or if you need additional support.   Orders: Overnight oximetry on room air to assess for nocturnal oxygen  Labs today to check CBC with diff, to correlate diffusion capacity with most recent hemoglobin  Looking into starting new medication called jascayd (our pharmacy team will contact you)- continue pirfenidone  as directed   Follow-up 3 months with Dr. Geronimo- 30 mins / ILD follow-up    Contains text generated by [employer].

## 2024-05-23 ENCOUNTER — Telehealth: Payer: Self-pay | Admitting: Primary Care

## 2024-05-23 ENCOUNTER — Ambulatory Visit: Payer: Self-pay | Admitting: Primary Care

## 2024-05-23 NOTE — Telephone Encounter (Signed)
 Can you correlate diffusion for hgb from 11/24

## 2024-05-23 NOTE — Addendum Note (Signed)
 Addended by: CLAUDENE NEVINS A on: 05/23/2024 08:15 AM   Modules accepted: Orders

## 2024-05-24 ENCOUNTER — Telehealth: Payer: Self-pay

## 2024-05-24 ENCOUNTER — Encounter (HOSPITAL_COMMUNITY): Payer: Self-pay

## 2024-05-24 ENCOUNTER — Telehealth (HOSPITAL_COMMUNITY): Payer: Self-pay

## 2024-05-24 DIAGNOSIS — J849 Interstitial pulmonary disease, unspecified: Secondary | ICD-10-CM

## 2024-05-24 NOTE — Telephone Encounter (Signed)
 Received referral for new start Jascayd. Opening benefits investigation in this thread.

## 2024-05-24 NOTE — Telephone Encounter (Signed)
 Office referral received for Pulmonary rehab. Attempted to call patient in regards to Pulmonary Rehab - LM on VM  Sent letter

## 2024-05-29 ENCOUNTER — Other Ambulatory Visit (HOSPITAL_COMMUNITY): Payer: Self-pay

## 2024-05-29 NOTE — Telephone Encounter (Signed)
 Received notification from [facility] regarding a prior authorization for JASCAYD. Authorization has been APPROVED from 05/29/24 to 06/28/25. Approval letter sent to scan center.  Per test claim, copay for 30 days supply is $0  Patient can fill through [facility]: [phone]   Authorization # [id number] Phone # [phone]

## 2024-05-29 NOTE — Telephone Encounter (Signed)
 Submitted a Prior Authorization request to [employer] for JASCAYD via CoverMyMeds. Will update once we receive a response.  Key: [id number]

## 2024-06-01 ENCOUNTER — Other Ambulatory Visit: Payer: Self-pay

## 2024-06-01 NOTE — Telephone Encounter (Signed)
 ATC patient regarding new start Jascayd. LVMTCB

## 2024-06-02 ENCOUNTER — Telehealth (HOSPITAL_COMMUNITY): Payer: Self-pay

## 2024-06-02 NOTE — Telephone Encounter (Signed)
 Last message posted in error, patient has been cleared for scheduling.  Attempted to call patient to schedule pulmonary rehab- no answer, left message.

## 2024-06-02 NOTE — Telephone Encounter (Signed)
 Patient called back regarding pulmonary rehab, confirmed interest in program. Will pass to nurse for review.

## 2024-06-06 ENCOUNTER — Other Ambulatory Visit: Payer: Self-pay | Admitting: Family

## 2024-06-06 DIAGNOSIS — I10 Essential (primary) hypertension: Secondary | ICD-10-CM

## 2024-06-06 DIAGNOSIS — I2510 Atherosclerotic heart disease of native coronary artery without angina pectoris: Secondary | ICD-10-CM

## 2024-06-06 DIAGNOSIS — E782 Mixed hyperlipidemia: Secondary | ICD-10-CM

## 2024-06-07 ENCOUNTER — Telehealth: Payer: Self-pay | Admitting: *Deleted

## 2024-06-07 NOTE — Telephone Encounter (Signed)
 Copied from CRM [id number]. Topic: Clinical - Order For Equipment >> May 01, 2024 10:20 AM Essie A wrote: Reason for CRM: Patient received a letter from [facility] to send in another order for oxygen .  Please let patient know when this has been done.  Her phone number is [phone]. >> Jun 06, 2024  5:01 PM Rozanna MATSU wrote: Nidia with [facility] is needing the order for pt oxygen  concentrator and chart notes from 11/24 faxed to this number please [phone]   Ashlyn, Do you know anything about this?  I do not see an order from her last OV as it appears she did not qualify?  Is that correct?

## 2024-06-07 NOTE — Telephone Encounter (Signed)
 Second attempt to reach patient. Left HIPAA compliant VM requesting she return my call. Also requested she leave me days/times when she is available for a call if she gets my voicemail to avoid further back and forth.

## 2024-06-09 NOTE — Telephone Encounter (Signed)
 Third attempt to reach patient at [phone] -    Left HIPAA compliant VM requesting she return my call. Also requested she leave me days/times when she is available for a call if she gets my voicemail to avoid further back and forth.

## 2024-06-09 NOTE — Telephone Encounter (Signed)
 Pt was walked at her lov. Pt did not qualify for o2.

## 2024-06-11 ENCOUNTER — Encounter: Payer: Self-pay | Admitting: Internal Medicine

## 2024-06-11 ENCOUNTER — Telehealth: Payer: Self-pay | Admitting: Internal Medicine

## 2024-06-11 DIAGNOSIS — J84112 Idiopathic pulmonary fibrosis: Secondary | ICD-10-CM

## 2024-06-13 NOTE — Telephone Encounter (Signed)
ATC patient x1.  LVM to return call. 

## 2024-06-14 NOTE — Telephone Encounter (Unsigned)
 Copied from CRM #[id number]. Topic: General - Other >> Jun 13, 2024  4:30 PM Rilla B wrote: Reason for CRM: Patient returning call to [facility]. Please call patient [phone]  ATC x1.  LVM to return call.

## 2024-06-16 ENCOUNTER — Telehealth (HOSPITAL_COMMUNITY): Payer: Self-pay

## 2024-06-16 ENCOUNTER — Encounter (HOSPITAL_COMMUNITY): Payer: Self-pay

## 2024-06-16 NOTE — Telephone Encounter (Signed)
 Fourth attempt to reach patient, LVM.   Will send letter when I am back in office.   If no communication by 07/13/24, will close case. Patient has been given pharmacy team contact information.

## 2024-06-16 NOTE — Telephone Encounter (Signed)
 Attempted f/u call regarding pulmonary rehab- no answer, left message. MyChart message has not been read, sent letter.  Closing referral.

## 2024-06-20 ENCOUNTER — Ambulatory Visit: Attending: Primary Care

## 2024-06-20 ENCOUNTER — Telehealth (HOSPITAL_COMMUNITY): Payer: Self-pay

## 2024-06-20 ENCOUNTER — Other Ambulatory Visit (HOSPITAL_COMMUNITY): Payer: Self-pay

## 2024-06-20 ENCOUNTER — Telehealth: Payer: Self-pay

## 2024-06-20 DIAGNOSIS — J849 Interstitial pulmonary disease, unspecified: Secondary | ICD-10-CM

## 2024-06-20 MED ORDER — JASCAYD 18 MG PO TABS
18.0000 mg | ORAL_TABLET | Freq: Two times a day (BID) | ORAL | 4 refills | Status: DC
  Filled 2024-06-26: qty 60, 30d supply, fill #0

## 2024-06-20 NOTE — Telephone Encounter (Signed)
 Spoke with pt and gave her [facility] pulm rehab #. Pt verbalized understanding and said would call to make appt. NFN

## 2024-06-20 NOTE — Telephone Encounter (Signed)
 Patient returned my call - counseling completed in pharmacotherapy visit note 06/20/24.

## 2024-06-20 NOTE — Telephone Encounter (Signed)
 Patient called back to get scheduled in pulmonary rehab. Informed her we are waiting for January schedule to be fully approved and will call her when it is open.

## 2024-06-20 NOTE — Progress Notes (Signed)
  Pharmacotherapy Clinic  Referring Provider: Landry Ferrari, NP  Virtual Visit via Telephone Note  I connected with Amy Escobar on 06/20/2024 at 10:30 AM EST by telephone and verified that I am speaking with the correct person using two identifiers.  Location: Patient: work - [employer]geon: office   I discussed the limitations, risks, security and privacy concerns of performing an evaluation and management service by telephone and the availability of in person appointments. I also discussed with the patient that there may be a patient responsible charge related to this service. The patient expressed understanding and agreed to proceed.   Subjective:  Patient presents today via telephone to [facility] Pharmacotherapy [facility] team for Jascayd  new start.  She is referred by Landry Ferrari, NP. She is also followed by Dr. Geronimo. PMH IPF.  Patient was last seen by Landry Ferrari, NP, on 05/22/24.  Pertinent past medical history includes URI, seasonal allergic rhinitis, HTN, mild pulmonary HTN, and ILD/IPF. Currently taking low-dose pirfenidone  for IPF, which she is currently tolerating well. Noted she has progressive IPF. Recommendation to ADD Jascayd  to pirfenidone .  Delay in starting care today due to unable to reach patient - pharmacy team made 4 attempts to reach patient: 06/01/24, 06/07/24, 06/09/14, 06/16/24. She returned my call today. Ready to start Jascayd .  Objective: Allergies[1]  Outpatient Encounter Medications as of 06/20/2024  Medication Sig Note   Accu-Chek Softclix Lancets lancets Use to prick finger to check blood glucose    aspirin  EC 81 MG tablet Take 81 mg by mouth daily.    azelastine  (ASTELIN ) 0.1 % nasal spray Place 2 sprays into both nostrils 2 (two) times daily.    benzonatate  (TESSALON ) 200 MG capsule Take 1 capsule (200 mg total) by mouth 3 (three) times daily as needed for cough.    Blood Glucose Monitoring Suppl (ACCU-CHEK GUIDE) w/Device KIT Use  to check blood glucose daily as directed    Blood Glucose Monitoring Suppl (ONETOUCH VERIO FLEX SYSTEM) w/Device KIT USE TO TEST BLOOD SUGAR DAILY AS DIRECTED    Calcium  Carb-Cholecalciferol (CALCIUM  + VITAMIN D3 PO) Take 1 tablet by mouth daily.    celecoxib  (CELEBREX ) 200 MG capsule TAKE 1 CAPSULE BY MOUTH DAILY    cephALEXin  (KEFLEX ) 250 MG capsule Take 250 mg by mouth daily.    cetirizine  (ZYRTEC ) 10 MG tablet Take 0.5 tablets (5 mg total) by mouth daily.    esomeprazole  (NEXIUM ) 40 MG capsule Take 1 capsule (40 mg total) by mouth 2 (two) times daily before a meal.    estradiol (ESTRACE) 0.1 MG/GM vaginal cream Place 1 Applicatorful vaginally at bedtime.    fesoterodine  (TOVIAZ ) 4 MG TB24 tablet Take 4 mg by mouth daily.    fluticasone  (FLONASE ) 50 MCG/ACT nasal spray Place 2 sprays into both nostrils daily as needed for allergies or rhinitis.    gabapentin  (NEURONTIN ) 100 MG capsule Take 1 capsule (100 mg total) by mouth at bedtime. 06/16/2023: Pt states she uses 2 strengths of gabapentin .   gabapentin  (NEURONTIN ) 300 MG capsule Take 300 mg by mouth daily as needed (neuropathy).    glucose blood (ACCU-CHEK GUIDE TEST) test strip Use to test blood glucose daily    glucose blood (ONETOUCH VERIO) test strip USE TO CHECK BLOOD SUGAR TWO TIMES A DAY    lidocaine  (LIDODERM ) 5 % APPLY 1 PATCH TO AFFECTED AREA FOR 12 HOURS IN A 24 HOUR PERIOD. DISCARD PATCH WITHIN 12 HOURS OR AS DIRECTED BY MD    montelukast  (SINGULAIR )  10 MG tablet Take 1 tablet (10 mg total) by mouth at bedtime.    Multiple Vitamins-Minerals (ZINC PO) Take 1 tablet by mouth daily.    OXYGEN  Inhale into the lungs. 2 liters    Pirfenidone  267 MG TABS Take 2 tablets (534 mg total) by mouth in the morning, at noon, and at bedtime. **low dose as maintenance**    rosuvastatin  (CRESTOR ) 20 MG tablet TAKE 1 TABLET BY MOUTH DAILY    Semaglutide ,0.25 or 0.5MG /DOS, (OZEMPIC , 0.25 OR 0.5 MG/DOSE,) 2 MG/3ML SOPN 0.5 mg by Other route once a  week.    valsartan  (DIOVAN ) 160 MG tablet TAKE 1 TABLET BY MOUTH DAILY    No facility-administered encounter medications on file as of 06/20/2024.     Immunization History  Administered Date(s) Administered   Fluad Quad(high Dose 65+) 04/18/2019   INFLUENZA, HIGH DOSE SEASONAL PF 03/11/2023, 03/07/2024   Influenza Split 03/23/2011, 04/07/2013   Influenza Whole 04/13/2008, 04/29/2009, 03/29/2010, 03/29/2018   Influenza-Unspecified 04/08/2012, 03/28/2014, 03/14/2015, 03/13/2016, 04/15/2020   PFIZER(Purple Top)SARS-COV-2 Vaccination 08/13/2019, 09/05/2019, 04/15/2020   PNEUMOCOCCAL CONJUGATE-20 04/14/2024   PPD Test 03/23/2011   Pfizer Covid-19 Vaccine Bivalent Booster 76yrs & up 04/29/2021   Pfizer(Comirnaty)Fall Seasonal Vaccine 12 years and older 05/21/2024   Pneumococcal Conjugate-13 01/10/2014   Pneumococcal Polysaccharide-23 07/07/2010, 03/31/2018   Td 12/27/2001   Tdap 08/06/2014   Zoster Recombinant(Shingrix) 03/31/2018      PFT's TLC  Date Value Ref Range Status  01/25/2023 4.32 L Final      CMP     Component Value Date/Time   NA 137 05/22/2024 1013   NA 143 01/21/2023 0950   K 4.4 05/22/2024 1013   CL 101 05/22/2024 1013   CO2 30 05/22/2024 1013   GLUCOSE 92 05/22/2024 1013   BUN 15 05/22/2024 1013   BUN 19 01/21/2023 0950   CREATININE 0.82 05/22/2024 1013   CREATININE 0.76 02/13/2020 1008   CALCIUM  9.4 05/22/2024 1013   PROT 7.4 05/22/2024 1013   PROT 7.6 01/21/2023 0950   ALBUMIN 4.1 05/22/2024 1013   ALBUMIN 4.4 01/21/2023 0950   AST 22 05/22/2024 1013   ALT 25 05/22/2024 1013   ALKPHOS 87 05/22/2024 1013   BILITOT 0.4 05/22/2024 1013   BILITOT 0.3 01/21/2023 0950   GFRNONAA >60 06/18/2023 0244   GFRAA 100 08/16/2020 0748    eGFR 68.32 (05/22/24)  HRCT (10/25/23) IMPRESSION: 1. 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 Findings are consistent with UIP per consensus guidelines: Diagnosis of Idiopathic Pulmonary Fibrosis: An Official ATS/ERS/JRS/ALAT Clinical Practice Guideline. Am JINNY Honey Crit Care Med Vol 198, Iss 5, [phone], Feb 27 2017.  Assessment and Plan  Jascayd  Medication Management Thoroughly counseled patient on the efficacy, mechanism of action, dosing, administration, adverse effects, and monitoring parameters of Jascayd .  Patient verbalized understanding.   Goals of Therapy: Will not stop or reverse the progression of ILD. It will slow the progression of ILD.   Dosing: Recommended dose will be 18mg  1 tablet twice daily. May be administered with or without regard to food.   Adverse Effects: Weight loss (background pirfenidone : 6%) Decreased appetite (concomitant pirfenidone : 13%) Diarrhea (concomitant pirfenidone : 24%)  Monitoring: Monitor for diarrhea, decreased appetite, weight loss  Drug interactions: nerandomilast  is a major substrate of CYP3A4. Avoid use of moderate and strong CYP3A4 inducers or inhibitors.  Medication list reviewed; no documented use of moderate and strong CYP3A4 inducers or inhibitors  at this time.  Access: Approval of Jascayd  through: insurance Rx sent to: [facility]: [phone]   Medication Reconciliation A drug regimen assessment was performed, including review of allergies, interactions, disease-state management, dosing and immunization history. Medications were reviewed with the patient, including name, instructions, indication, goals of therapy, potential side effects, importance of adherence, and safe use.  Patient reports that she is confirm medications she is currently taking at this time - using documented medication list which was last reviewed on 05/22/24 at last OV with pulmonary care. She denies changes since then.  PLAN:  - CONTINUE pirfenidone  534mg  three times daily as prescribed. - START Jascayd  18mg  1 tablet twice  daily. - Follow-up with Dr. Geronimo as planned on 08/28/2024 - Patient to alert our office of any significant intolerances once she starts Jascayd  - Advised that she will be contacted by our pharmacy to start Jascayd  - if we are unable to contact her, we will be unable to mail medication. If she has missed calls, please check voicemail and return call to pharmacy.   I discussed the assessment and treatment plan with the patient. The patient was provided an opportunity to ask questions and all were answered. The patient agreed with the plan and demonstrated an understanding of the instructions.   The patient was advised to call back or seek an in-person evaluation if the symptoms worsen or if the condition fails to improve as anticipated.  I provided 10 minutes of non-face-to-face time during this encounter.  Aleck Puls, PharmD, BCPS, CPP Clinical Pharmacist  [facility]  [facility] Pharmacotherapy [facility]     [1]  Allergies Allergen Reactions   Benicar [Olmesartan] Other (See Comments)    Headache

## 2024-06-20 NOTE — Telephone Encounter (Signed)
 Copied from CRM #[id number]. Topic: Appointments - Scheduling Inquiry for Clinic >> Jun 20, 2024  8:08 AM Isabell A wrote: Reason for CRM: Patient states she is supposed to have appointments scheduled for pulmonary rehab - requesting assistance, also requesting to speak with [facility].  Callback number: [phone]   Referral was placed 11/24 to [facility] can you please provide me with Pulm Rehab number for the pt to contact to schedule. Please see referral notes, the pt was unreachable and referral was closed.  [facility] please advise.

## 2024-06-21 ENCOUNTER — Telehealth (HOSPITAL_COMMUNITY): Payer: Self-pay

## 2024-06-21 NOTE — Telephone Encounter (Signed)
 Patient called and  stated that she was just trying to figure our if we could schedule her for Pulmonary rehab yet.   Advised that we would be calling her in January to schedule and would also have her insurance responsibility available for her.

## 2024-06-21 NOTE — Telephone Encounter (Signed)
 Spoke with the pt  She states that she did not call the office and was unsure why we thought this  She is doing well and does not need any refills or anything  I scheduled her PFT  Nothing further needed

## 2024-06-21 NOTE — Telephone Encounter (Signed)
 She had called stating cough was better with mucinex . That is an odd call middle of night. Please call nd find out if cough is getting worse or she wants mucinex . If latter, it is OTC  Also ensure she has PFT before March 2025 visitwith me.  - I ordered

## 2024-06-26 ENCOUNTER — Other Ambulatory Visit: Payer: Self-pay

## 2024-06-26 ENCOUNTER — Other Ambulatory Visit (HOSPITAL_COMMUNITY): Payer: Self-pay

## 2024-06-26 NOTE — Progress Notes (Signed)
 Specialty Pharmacy Initial Fill Coordination Note  Amy Escobar is a 78 y.o. female contacted today regarding initial fill of specialty medication(s) Nerandomilast  (Jascayd ); Pirfenidone    Patient requested Delivery   Delivery date: 06/28/24   Verified address: [address], [place], [place] [address]   Medication will be filled on: 06/27/24   Patient is aware of $0 copayment.    **Please call pt for refills. Also discussed refilling pirfenidone  if we are able to ship both at the same time. Advised her she may receive a call from the pharmacy to confirm refilling pirfenidone , pt's phone number is [phone]**

## 2024-06-27 ENCOUNTER — Other Ambulatory Visit: Payer: Self-pay

## 2024-06-27 ENCOUNTER — Other Ambulatory Visit (HOSPITAL_COMMUNITY): Payer: Self-pay

## 2024-06-28 ENCOUNTER — Other Ambulatory Visit: Payer: Self-pay | Admitting: Pulmonary Disease

## 2024-06-28 ENCOUNTER — Other Ambulatory Visit: Payer: Self-pay

## 2024-06-28 DIAGNOSIS — R053 Chronic cough: Secondary | ICD-10-CM

## 2024-06-28 NOTE — Telephone Encounter (Signed)
Ok to refill per last note.

## 2024-06-28 NOTE — Telephone Encounter (Signed)
 Spoke with patient Amy Escobar, Rx has been sent

## 2024-06-30 ENCOUNTER — Other Ambulatory Visit: Payer: Self-pay | Admitting: Allergy

## 2024-06-30 ENCOUNTER — Ambulatory Visit: Admitting: Allergy

## 2024-07-03 ENCOUNTER — Encounter: Payer: Self-pay | Admitting: Internal Medicine

## 2024-07-03 ENCOUNTER — Telehealth (HOSPITAL_COMMUNITY): Payer: Self-pay

## 2024-07-03 ENCOUNTER — Encounter (HOSPITAL_COMMUNITY): Payer: Self-pay

## 2024-07-03 ENCOUNTER — Ambulatory Visit: Payer: Self-pay | Admitting: Internal Medicine

## 2024-07-03 DIAGNOSIS — G4734 Idiopathic sleep related nonobstructive alveolar hypoventilation: Secondary | ICD-10-CM | POA: Insufficient documentation

## 2024-07-03 NOTE — Telephone Encounter (Signed)
 Attempted to schedule pulmonary rehab- no answer, left message. Mailed letter.

## 2024-07-04 NOTE — Progress Notes (Signed)
 Called and relayed results and pt confirmed understanding but asked if she should still wear her O2 at night since results are neg  Pls advise

## 2024-07-04 NOTE — Progress Notes (Signed)
 Called and informed pt // pt confirmed understanding

## 2024-07-05 ENCOUNTER — Telehealth (HOSPITAL_COMMUNITY): Payer: Self-pay

## 2024-07-05 NOTE — Telephone Encounter (Signed)
 Patient returned call to get scheduled in the Pulmonary Rehab Program. Patient will come in for orientation on 1/12 and will attend the 10:15 exercise class.  Patient refused message through MyChart, will attempt to mail letter in time before orientation day. Confirmed information over the phone.

## 2024-07-06 ENCOUNTER — Other Ambulatory Visit: Payer: Self-pay

## 2024-07-06 ENCOUNTER — Encounter: Payer: Self-pay | Admitting: Family Medicine

## 2024-07-06 ENCOUNTER — Ambulatory Visit: Admitting: Family Medicine

## 2024-07-06 VITALS — BP 120/78 | HR 89 | Temp 97.7°F

## 2024-07-06 DIAGNOSIS — J3089 Other allergic rhinitis: Secondary | ICD-10-CM | POA: Diagnosis not present

## 2024-07-06 DIAGNOSIS — Z20822 Contact with and (suspected) exposure to covid-19: Secondary | ICD-10-CM

## 2024-07-06 DIAGNOSIS — H1013 Acute atopic conjunctivitis, bilateral: Secondary | ICD-10-CM

## 2024-07-06 DIAGNOSIS — J849 Interstitial pulmonary disease, unspecified: Secondary | ICD-10-CM | POA: Diagnosis not present

## 2024-07-06 DIAGNOSIS — J302 Other seasonal allergic rhinitis: Secondary | ICD-10-CM | POA: Diagnosis not present

## 2024-07-06 DIAGNOSIS — K219 Gastro-esophageal reflux disease without esophagitis: Secondary | ICD-10-CM

## 2024-07-06 MED ORDER — FLUTICASONE PROPIONATE 50 MCG/ACT NA SUSP: 2.0000 | Freq: Every day | NASAL | 5 refills | Status: AC | PRN

## 2024-07-06 NOTE — Progress Notes (Signed)
 "  [address] [place] [address] Dept: [phone]  FOLLOW UP NOTE  Patient ID: Amy Escobar, female    DOB: June 20, 1946  Age: 79 y.o. MRN: [id number] Date of Office Visit: 07/06/2024  Assessment  Chief Complaint: Follow-up, Nasal Congestion (Sneezing  runny nose ), and Cough  HPI Amy Escobar is a 79 year old female who presents to clinic for follow-up visit.  She was last seen in this clinic on 05/18/2024 by Arlean Mutter, FNP, for evaluation of cough, allergic rhinitis, allergic conjunctivitis, and reflux.  Discussed the use of AI scribe software for clinical note transcription with the patient, who gave verbal consent to proceed.  History of Present Illness Amy Escobar is a 79 year old female who presents with increased mucus production and wheezing.  She continues to follow-up with Dr. Geronimo, pulmonology specialist, for treatment of interstitial lung disease.  She reports that at yesterday's visit with her pulmonology specialist she began taking Jascayd .  She reports that she is beginning pulmonary rehab dilatation program at [facility] next week.  Over the past month, she has experienced increase in clear to white mucus production and wheezing.  She reports that she has been exposed to several people with COVID at her church and in her family and is interested in COVID testing at today's visit.  She denies recent fever or chills, she does report some sweating at night, however, she has been sleeping with flannel sheets.  She denies shortness of breath with activity or rest.  She recently followed up with Dr. Darlean, pulmonology specialist, for evaluation of nighttime hypoxemia.  Study results indicate that she still qualifies for O2 therapy, however, did not feel it was beneficial at this point.  In the interim, Amy Escobar has stopped using oxygen  therapy without adverse reaction.  She reports reflux has been well-controlled with occasional heartburn as the main  symptom.  She continues to follow-up with her GI specialist as recommended.  She reports nasal symptoms including occasional rhinorrhea and sneezing, with clear nasal discharge. She uses Flonase  nasal spray, Astelin  nasal spray and saline rinse regularly. She continues to take montelukast  10 mg once a day.  Her last environmental allergy  testing was positive to grass pollen, weed pollen, ragweed pollen, tree pollen, mold, dog, cockroach, and tobacco.  Allergic conjunctivitis is reported as moderately well-controlled with occasional red and itchy eyes for which she uses olopatadine  with relief of symptoms as needed.  Her current medications are listed in the chart.  Of note, patient informed that we do have a COVID test, however, we do not have influenza A, influenza B, or RSV testing.  She was encouraged to go to urgent care or her primary care provider for further testing if she should develop symptoms including fever, sweats, chills, or bodyaches.  Drug Allergies:  Allergies[1]  Physical Exam: BP 120/78   Pulse 89   Temp 97.7 F (36.5 C)   SpO2 96%    Physical Exam Vitals reviewed.  Constitutional:      Appearance: Normal appearance.  HENT:     Head: Normocephalic and atraumatic.     Right Ear: Tympanic membrane normal.     Left Ear: Tympanic membrane normal.     Nose:     Comments: Bilateral nares normal.  Pharynx normal.  Ears normal.  Eyes normal.    Mouth/Throat:     Pharynx: Oropharynx is clear.  Eyes:     Conjunctiva/sclera: Conjunctivae normal.  Cardiovascular:  Rate and Rhythm: Normal rate and regular rhythm.     Heart sounds: Normal heart sounds. No murmur heard. Pulmonary:     Effort: Pulmonary effort is normal.     Breath sounds: Normal breath sounds.     Comments: Lungs clear to auscultation Musculoskeletal:        General: Normal range of motion.  Skin:    General: Skin is warm and dry.  Neurological:     Mental Status: She is alert and oriented to  person, place, and time.  Psychiatric:        Mood and Affect: Mood normal.        Behavior: Behavior normal.        Thought Content: Thought content normal.        Judgment: Judgment normal.     Assessment and Plan: 1. Exposure to COVID-19 virus   2. Seasonal and perennial allergic rhinitis   3. Allergic conjunctivitis of both eyes   4. Interstitial lung disease (HCC)   5. Gastroesophageal reflux disease, unspecified whether esophagitis present     No orders of the defined types were placed in this encounter.   Patient Instructions  Exposure to COVID/influenza Your COVID testing was negative at today's visit Continue to monitor your sympotms If you develop symptoms go to your primary care provider or Urgent Care for a respiratory viral panel (this checks for flu)  Allergic rhinitis Continue allergen avoidance measures directed toward grass pollen, weed pollen, ragweed pollen, tree pollen, mold, dog, cockroach and tobacco as listed below Continue montelukast  10 mg once a day for allergy  symptom control Continue Flonase  2 sprays in each nostril once a day for nasal congestion.  In the right nostril, point the applicator out toward the right ear. In the left nostril, point the applicator out toward the left ear Continue azelastine  2 sprays in each nostril twice a day if needed for a runny nose Continue saline nasal rinses as needed for nasal symptoms. Use this before any medicated nasal sprays for best result  Allergic conjunctivitis Some over the counter eye drops include Pataday  one drop in each eye once a day as needed for red, itchy eyes OR Zaditor one drop in each eye twice a day as needed for red itchy eyes. Avoid eye drops that say red eye relief as they may contain medications that dry out your eyes.   Reflux Continue dietary lifestyle modifications as listed below Continue to follow up with your GI doctor for evaluation and treatment of reflux  Interstitial lung  disease Continue to follow-up with Dr. Geronimo as recommended  Call the clinic if this treatment plan is not working well for you.  Follow up in 3 months or sooner if needed.  Return in about 3 months (around 10/04/2024).    Thank you for the opportunity to care for this patient.  Please do not hesitate to contact me with questions.  Arlean Mutter, FNP [facility]          [1]  Allergies Allergen Reactions   Benicar [Olmesartan] Other (See Comments)    Headache    "

## 2024-07-06 NOTE — Progress Notes (Signed)
 Please see office visit note 06/20/24 - patient was educated in detail before starting Jascayd . Jascayd  is add-on therapy to pirfenidone .

## 2024-07-06 NOTE — Patient Instructions (Addendum)
 Exposure to COVID/influenza Your COVID testing was negative at today's visit Continue to monitor your sympotms If you develop symptoms go to your primary care provider or Urgent Care for a respiratory viral panel (this checks for flu)  Allergic rhinitis Continue allergen avoidance measures directed toward grass pollen, weed pollen, ragweed pollen, tree pollen, mold, dog, cockroach and tobacco as listed below Continue montelukast  10 mg once a day for allergy  symptom control Continue Flonase  2 sprays in each nostril once a day for nasal congestion.  In the right nostril, point the applicator out toward the right ear. In the left nostril, point the applicator out toward the left ear Continue azelastine  2 sprays in each nostril twice a day if needed for a runny nose Continue saline nasal rinses as needed for nasal symptoms. Use this before any medicated nasal sprays for best result  Allergic conjunctivitis Some over the counter eye drops include Pataday  one drop in each eye once a day as needed for red, itchy eyes OR Zaditor one drop in each eye twice a day as needed for red itchy eyes. Avoid eye drops that say red eye relief as they may contain medications that dry out your eyes.   Reflux Continue dietary lifestyle modifications as listed below Continue to follow up with your GI doctor for evaluation and treatment of reflux  Interstitial lung disease Continue to follow-up with Dr. Geronimo as recommended  Call the clinic if this treatment plan is not working well for you.  Follow up in 3 months or sooner if needed.  Reducing Pollen Exposure The American Academy of Allergy , Asthma and Immunology suggests the following steps to reduce your exposure to pollen during allergy  seasons. Do not hang sheets or clothing out to dry; pollen may collect on these items. Do not mow lawns or spend time around freshly cut grass; mowing stirs up pollen. Keep windows closed at night.  Keep car windows  closed while driving. Minimize morning activities outdoors, a time when pollen counts are usually at their highest. Stay indoors as much as possible when pollen counts or humidity is high and on windy days when pollen tends to remain in the air longer. Use air conditioning when possible.  Many air conditioners have filters that trap the pollen spores. Use a HEPA room air filter to remove pollen form the indoor air you breathe.  Control of Mold Allergen Mold and fungi can grow on a variety of surfaces provided certain temperature and moisture conditions exist.  Outdoor molds grow on plants, decaying vegetation and soil.  The major outdoor mold, Alternaria and Cladosporium, are found in very high numbers during hot and dry conditions.  Generally, a late Summer - Fall peak is seen for common outdoor fungal spores.  Rain will temporarily lower outdoor mold spore count, but counts rise rapidly when the rainy period ends.  The most important indoor molds are Aspergillus and Penicillium.  Dark, humid and poorly ventilated basements are ideal sites for mold growth.  The next most common sites of mold growth are the bathroom and the kitchen.  Outdoor [employer] Use air conditioning and keep windows closed Avoid exposure to decaying vegetation. Avoid leaf raking. Avoid grain handling. Consider wearing a face mask if working in moldy areas.  Indoor Mold Control Maintain humidity below 50%. Clean washable surfaces with 5% bleach solution. Remove sources e.g. Contaminated carpets.  Control of Dog or Cat Allergen Avoidance is the best way to manage a dog or cat allergy . If you  have a dog or cat and are allergic to dog or cats, consider removing the dog or cat from the home. If you have a dog or cat but dont want to find it a new home, or if your family wants a pet even though someone in the household is allergic, here are some strategies that may help keep symptoms at bay:  Keep the pet out of your  bedroom and restrict it to only a few rooms. Be advised that keeping the dog or cat in only one room will not limit the allergens to that room. Dont pet, hug or kiss the dog or cat; if you do, wash your hands with soap and water. High-efficiency particulate air (HEPA) cleaners run continuously in a bedroom or living room can reduce allergen levels over time. Regular use of a high-efficiency vacuum cleaner or a central vacuum can reduce allergen levels. Giving your dog or cat a bath at least once a week can reduce airborne allergen.  Control of Cockroach Allergen Cockroach allergen has been identified as an important cause of acute attacks of asthma, especially in urban settings.  There are fifty-five species of cockroach that exist in the [place] , however only three, the American, German and Oriental species produce allergen that can affect patients with Asthma.  Allergens can be obtained from fecal particles, egg casings and secretions from cockroaches.    Remove food sources. Reduce access to water. Seal access and entry points. Spray runways with 0.5-1% Diazinon or Chlorpyrifos Blow boric acid power under stoves and refrigerator. Place bait stations (hydramethylnon) at feeding sites.

## 2024-07-07 ENCOUNTER — Telehealth (HOSPITAL_COMMUNITY): Payer: Self-pay

## 2024-07-07 ENCOUNTER — Ambulatory Visit: Payer: Self-pay | Admitting: Internal Medicine

## 2024-07-07 NOTE — Telephone Encounter (Signed)
 FYI Only or Action Required?: Action required by provider: clinical question for provider.  Patient is followed in Pulmonology for ILD, last seen on 05/22/2024 by Hope Almarie ORN, NP.  Called Nurse Triage reporting Headache and Medication Reaction.  Symptoms began today.  Interventions attempted: OTC medications: tylenol .  Symptoms are: unchanged.  Triage Disposition: See Physician Within 24 Hours  Patient/caregiver understands and will follow disposition?: No, wishes to speak with PCP    Copied from CRM #[id number]. Topic: Clinical - Red Word Triage >> Jul 07, 2024  4:06 PM Devaughn RAMAN wrote: Red Word that prompted transfer to Nurse Triage: Pt is calling in regards to an regards to a side effect of the medication nerandomilast  (JASCAYD ) 18 MG tablet. Reason for Disposition  [1] MODERATE headache (e.g., interferes with normal activities) AND [2] present > 24 hours AND [3] unexplained  (Exceptions: Pain medicines not tried, typical migraine, or headache part of viral illness.)  Answer Assessment - Initial Assessment Questions Pt was started on jascayd  on Wednesday. She states about 4am today she began to get a headache. She did take tylenol  but wasn't sure if she could take anymore. BP at home 132/89. She denies any high acuity symptoms. RN did advise pt to hold off on medication until she hears back as she has a headache that's a 7-8. RN did advise that if pain gets worse or any other symptoms show up, pt should go to the ER to be evaluated. Pt stated understanding.      1. LOCATION: Where does it hurt?      general 2. ONSET: When did the headache start? (e.g., minutes, hours, days)      This morning at 4am 3. PATTERN: Does the pain come and go, or has it been constant since it started?     constant 4. SEVERITY: How bad is the pain? and What does it keep you from doing?  (e.g., Scale 1-10; mild, moderate, or severe)     7-8 5. RECURRENT SYMPTOM: Have you ever had headaches  before? If Yes, ask: When was the last time? and What happened that time?      no 6. CAUSE: What do you think is causing the headache?     New medication 7 OTHER SYMPTOMS: Do you have any other symptoms? (e.g., fever, stiff neck, eye pain, sore throat, cold symptoms)     Denies any other symptoms  Protocols used: Headache-A-AH

## 2024-07-07 NOTE — Telephone Encounter (Signed)
 Called to confirm appt. Pt confirmed appt. Instructed pt on proper footwear. Gave directions along with department number.

## 2024-07-10 ENCOUNTER — Encounter (HOSPITAL_COMMUNITY)
Admission: RE | Admit: 2024-07-10 | Discharge: 2024-07-10 | Disposition: A | Discharge status: Home / Self Care | Source: Ambulatory Visit | Attending: Internal Medicine | Admitting: Internal Medicine

## 2024-07-10 ENCOUNTER — Encounter (HOSPITAL_COMMUNITY): Payer: Self-pay

## 2024-07-10 VITALS — BP 144/88 | HR 104 | Wt 158.7 lb

## 2024-07-10 DIAGNOSIS — J849 Interstitial pulmonary disease, unspecified: Secondary | ICD-10-CM | POA: Insufficient documentation

## 2024-07-10 NOTE — Progress Notes (Signed)
 Amy Escobar 79 y.o. female Pulmonary Rehab Orientation Note This patient who was referred to Pulmonary Rehab by Dr. Geronimo with the diagnosis of ILD arrived today in Cardiac and Pulmonary Rehab. She  arrived ambulatory with normal gait. She   does carry portable oxygen . [facility]  is the provider for their DME. Per patient, Amy Escobar uses oxygen  intermittently. Color good, skin warm and dry. Patient is oriented to time and place. Patient's medical history, psychosocial health, and medications reviewed. Psychosocial assessment reveals patient lives with alone. Amy Escobar is currently retired. Patient hobbies include watching tv, spending time with others, and reading. Patient reports her stress level is low. Areas of stress/anxiety include health. Patient does not exhibit signs of depression.  PHQ2/9 score 0/2. [name] shows good  coping skills with positive outlook on life. Offered emotional support and reassurance. Will continue to monitor. Physical assessment performed by Nurse pick: Ronal Levin RN. Please see their orientation physical assessment note. Amy Escobar reports she does take medications as prescribed. Patient states she follows a regular  diet.  Patient's weight will be monitored closely. Demonstration and practice of PLB using pulse oximeter. Amy Escobar able to return demonstration satisfactorily. Safety and hand hygiene in the exercise area reviewed with patient. Amy Escobar voices understanding of the information reviewed. Department expectations discussed with patient and achievable goals were set. The patient shows enthusiasm about attending the program and we look forward to working with Ronal. Amy Escobar completed a 6 min walk test today and is scheduled to begin exercise on 07/18/24.   [id number] Amy Escobar, BSRT

## 2024-07-10 NOTE — Telephone Encounter (Signed)
 Three was higher headache with JASCAYD  by 2% in the study (In clinical trials, the percentage of patients on Jascayd  who reported a headache was 6% for the 9 mg dose and 7% for the 18 mg dose.  For comparison, headaches were reported by 5% of patients in the placebo group.  Headache is listed as a common adverse reaction with Jascayd , occurring in 1% to 10% of patients)   Plan  - stop JASCAYD  - next week if she is ipen to idea she can do 1 tablet a day for a week ->then got to 2 tablets -If HA returns then is most defintily Jascayd . However if she is too scared to try, then stop Jascayd  altogether

## 2024-07-10 NOTE — Telephone Encounter (Signed)
ATC x1.  LVM to return call. 

## 2024-07-10 NOTE — Telephone Encounter (Signed)
 Updated. nfn

## 2024-07-10 NOTE — Telephone Encounter (Signed)
 Dr. Geronimo, Please advise regarding side effects she believes are due to starting JASCAYD , please advise.  Thank you.

## 2024-07-10 NOTE — Progress Notes (Signed)
 Pulmonary Individual Treatment Plan  Patient Details  Name: Amy Escobar MRN: [id number] Date of Birth: 1945-07-11 Referring Provider:   Flowsheet Row PULMONARY REHAB OTHER RESP ORIENTATION from 07/10/2024 in [facility]  Referring Provider Ramaswamy    Initial Encounter Date:  Flowsheet Row PULMONARY REHAB OTHER RESP ORIENTATION from 07/10/2024 in [facility]  Date 07/10/24    Visit Diagnosis: ILD (interstitial lung disease) (HCC)  Patient's Home Medications on Admission:  Current Medications[1]  Past Medical History: Past Medical History:  Diagnosis Date   Alkaline phosphatase deficiency    w/u Ne   Allergic rhinitis    Allergy     Anxiety    Arthritis    Cataract    BILATERAL-REMOVED   DM2 (diabetes mellitus, type 2) (HCC)    GERD (gastroesophageal reflux disease)    Gout    Hemorrhoids    Hyperlipidemia    Hypertension    Interstitial lung disease (HCC)    Mild pulmonary hypertension (HCC)    NSVT (nonsustained ventricular tachycardia) (HCC)    Osteoporosis    PVC's (premature ventricular contractions)    Thyroid  nodule    small   Tubular adenoma of colon 2017   Urticaria    Uterine fibroid     Tobacco Use: Tobacco Use History[2]  Labs: Review Flowsheet  More data exists      Latest Ref Rng & Units 11/19/2022 03/08/2023 06/01/2023 09/07/2023 03/08/2024  Labs for ITP Cardiac and Pulmonary Rehab  Cholestrol 0 - 200 mg/dL 883  - 876  - -  LDL (calc) 0 - 99 mg/dL 58  - 71  - -  HDL-C >60.99 mg/dL 37  - 63.69  - -  Trlycerides 0.0 - 149.0 mg/dL 887  - 20.9  - -  Hemoglobin A1c 4.0 - 5.6 % 5.6  5.9  - 5.8  5.8     Capillary Blood Glucose: Lab Results  Component Value Date   GLUCAP 144 (H) 06/19/2023   GLUCAP 139 (H) 06/19/2023   GLUCAP 98 06/18/2023   GLUCAP 123 (H) 06/18/2023   GLUCAP 98 06/18/2023     Pulmonary Assessment Scores:   Pulmonary Assessment Scores     Row Name 07/10/24 1037         ADL UCSD   ADL Phase Entry     SOB Score total 39       CAT Score   CAT Score 21       mMRC Score   mMRC Score 3       UCSD: Self-administered rating of dyspnea associated with activities of daily living (ADLs) 6-point scale (0 = not at all to 5 = maximal or unable to do because of breathlessness)  Scoring Scores range from 0 to 120.  Minimally important difference is 5 units  CAT: CAT can identify the health impairment of COPD patients and is better correlated with disease progression.  CAT has a scoring range of zero to 40. The CAT score is classified into four groups of low (less than 10), medium (10 - 20), high (21-30) and very high (31-40) based on the impact level of disease on health status. A CAT score over 10 suggests significant symptoms.  A worsening CAT score could be explained by an exacerbation, poor medication adherence, poor inhaler technique, or progression of COPD or comorbid conditions.  CAT MCID is 2 points  mMRC: mMRC (Modified Medical  Research Council) Dyspnea Scale is used to assess the degree of baseline functional disability in patients of respiratory disease due to dyspnea. No minimal important difference is established. A decrease in score of 1 point or greater is considered a positive change.   Pulmonary Function Assessment:  Pulmonary Function Assessment - 07/10/24 1112       Breath   Bilateral Breath Sounds Clear    Shortness of Breath Yes;Limiting activity          Exercise Target Goals: Exercise Program Goal: Individual exercise prescription set using results from initial 6 min walk test and THRR while considering  patients activity barriers and safety.   Exercise Prescription Goal: Initial exercise prescription builds to 30-45 minutes a day of aerobic activity, 2-3 days per week.  Home exercise guidelines will be given to patient during program as part of exercise  prescription that the participant will acknowledge.  Activity Barriers & Risk Stratification:  Activity Barriers & Cardiac Risk Stratification - 07/10/24 1110       Activity Barriers & Cardiac Risk Stratification   Activity Barriers Deconditioning;Muscular Weakness;Shortness of Breath;Balance Concerns;Assistive Device    Cardiac Risk Stratification Moderate          6 Minute Walk:  6 Minute Walk     Row Name 07/10/24 1148         6 Minute Walk   Phase Initial     Distance 915 feet     Walk Time 6 minutes     # of Rest Breaks 0     MPH 1.73     METS 2.07     RPE 11.5     Perceived Dyspnea  0     VO2 Peak 7.26     Symptoms No     Resting HR 88 bpm     Resting BP 144/88     Resting Oxygen  Saturation  97 %     Exercise Oxygen  Saturation  during 6 min walk 89 %     Max Ex. HR 131 bpm     Max Ex. BP 130/80     2 Minute Post BP 126/80       Interval HR   1 Minute HR 131     2 Minute HR 95     3 Minute HR 117     4 Minute HR 104     5 Minute HR 91     6 Minute HR 94     2 Minute Post HR 88     Interval Heart Rate? Yes       Interval Oxygen    Interval Oxygen ? Yes     Baseline Oxygen  Saturation % 97 %     1 Minute Oxygen  Saturation % 93 %     1 Minute Liters of Oxygen  0 L     2 Minute Oxygen  Saturation % 91 %     2 Minute Liters of Oxygen  0 L     3 Minute Oxygen  Saturation % 89 %     3 Minute Liters of Oxygen  0 L     4 Minute Oxygen  Saturation % 90 %     4 Minute Liters of Oxygen  0 L     5 Minute Oxygen  Saturation % 91 %     5 Minute Liters of Oxygen  0 L     6 Minute Oxygen  Saturation % 91 %     6 Minute Liters of Oxygen  0 L     2 Minute Post  Oxygen  Saturation % 97 %     2 Minute Post Liters of Oxygen  0 L        Oxygen  Initial Assessment:  Oxygen  Initial Assessment - 07/10/24 1111       Home Oxygen    Home Oxygen  Device Home Concentrator;Portable Concentrator    Sleep Oxygen  Prescription Continuous   does not wear anymore   Liters per minute 2     Home Exercise Oxygen  Prescription Continuous    Liters per minute 2    Home Resting Oxygen  Prescription Continuous    Liters per minute 2    Compliance with Home Oxygen  Use Yes      Initial 6 min Walk   Oxygen  Used None      Program Oxygen  Prescription   Program Oxygen  Prescription None      Intervention   Short Term Goals To learn and exhibit compliance with exercise, home and travel O2 prescription;To learn and understand importance of maintaining oxygen  saturations>88%;To learn and demonstrate proper use of respiratory medications;To learn and understand importance of monitoring SPO2 with pulse oximeter and demonstrate accurate use of the pulse oximeter.;To learn and demonstrate proper pursed lip breathing techniques or other breathing techniques.     Long  Term Goals Exhibits compliance with exercise, home  and travel O2 prescription;Maintenance of O2 saturations>88%;Compliance with respiratory medication;Verbalizes importance of monitoring SPO2 with pulse oximeter and return demonstration;Exhibits proper breathing techniques, such as pursed lip breathing or other method taught during program session;Demonstrates proper use of MDIs          Oxygen  Re-Evaluation:   Oxygen  Discharge (Final Oxygen  Re-Evaluation):   Initial Exercise Prescription:  Initial Exercise Prescription - 07/10/24 1100       Date of Initial Exercise RX and Referring Provider   Date 07/10/24    Referring Provider Ramaswamy    Expected Discharge Date 10/03/24      NuStep   Level 1    SPM 55    Minutes 15    METs 1.5      Track   Minutes 15    METs 2      Prescription Details   Frequency (times per week) 2    Duration Progress to 30 minutes of continuous aerobic without signs/symptoms of physical distress      Intensity   THRR 40-80% of Max Heartrate 57-114    Ratings of Perceived Exertion 11-13    Perceived Dyspnea 0-4      Progression   Progression Continue to progress workloads to  maintain intensity without signs/symptoms of physical distress.      Resistance Training   Training Prescription Yes    Weight red bands    Reps 10-15          Perform Capillary Blood Glucose checks as needed.  Exercise Prescription Changes:   Exercise Comments:   Exercise Goals and Review:   Exercise Goals     Row Name 07/10/24 1031             Exercise Goals   Increase Physical Activity Yes       Intervention Provide advice, education, support and counseling about physical activity/exercise needs.;Develop an individualized exercise prescription for aerobic and resistive training based on initial evaluation findings, risk stratification, comorbidities and participant's personal goals.       Expected Outcomes Short Term: Attend rehab on a regular basis to increase amount of physical activity.;Long Term: Exercising regularly at least 3-5 days a week.;Long Term: Add in home exercise to  make exercise part of routine and to increase amount of physical activity.       Increase Strength and Stamina Yes       Intervention Provide advice, education, support and counseling about physical activity/exercise needs.;Develop an individualized exercise prescription for aerobic and resistive training based on initial evaluation findings, risk stratification, comorbidities and participant's personal goals.       Expected Outcomes Short Term: Increase workloads from initial exercise prescription for resistance, speed, and METs.;Short Term: Perform resistance training exercises routinely during rehab and add in resistance training at home;Long Term: Improve cardiorespiratory fitness, muscular endurance and strength as measured by increased METs and functional capacity ( )       Able to understand and use rate of perceived exertion (RPE) scale Yes       Intervention Provide education and explanation on how to use RPE scale       Expected Outcomes Short Term: Able to use RPE daily in rehab to  express subjective intensity level;Long Term:  Able to use RPE to guide intensity level when exercising independently       Able to understand and use Dyspnea scale Yes       Intervention Provide education and explanation on how to use Dyspnea scale       Expected Outcomes Short Term: Able to use Dyspnea scale daily in rehab to express subjective sense of shortness of breath during exertion;Long Term: Able to use Dyspnea scale to guide intensity level when exercising independently       Knowledge and understanding of Target Heart Rate Range (THRR) Yes       Intervention Provide education and explanation of THRR including how the numbers were predicted and where they are located for reference       Expected Outcomes Short Term: Able to state/look up THRR;Long Term: Able to use THRR to govern intensity when exercising independently;Short Term: Able to use daily as guideline for intensity in rehab       Understanding of Exercise Prescription Yes       Intervention Provide education, explanation, and written materials on patient's individual exercise prescription       Expected Outcomes Short Term: Able to explain program exercise prescription;Long Term: Able to explain home exercise prescription to exercise independently          Exercise Goals Re-Evaluation :   Discharge Exercise Prescription (Final Exercise Prescription Changes):   Nutrition:  Target Goals: Understanding of nutrition guidelines, daily intake of sodium 1500mg , cholesterol 200mg , calories 30% from fat and 7% or less from saturated fats, daily to have 5 or more servings of fruits and vegetables.  Biometrics:    Nutrition Therapy Plan and Nutrition Goals:   Nutrition Assessments:  MEDIFICTS Score Key: >=70 Need to make dietary changes  40-70 Heart Healthy Diet <= 40 Therapeutic Level Cholesterol Diet   Picture Your Plate Scores: <59 Unhealthy dietary pattern with much room for improvement. 41-50 Dietary pattern  unlikely to meet recommendations for good health and room for improvement. 51-60 More healthful dietary pattern, with some room for improvement.  >60 Healthy dietary pattern, although there may be some specific behaviors that could be improved.    Nutrition Goals Re-Evaluation:   Nutrition Goals Discharge (Final Nutrition Goals Re-Evaluation):   Psychosocial: Target Goals: Acknowledge presence or absence of significant depression and/or stress, maximize coping skills, provide positive support system. Participant is able to verbalize types and ability to use techniques and skills needed for reducing stress and depression.  Initial Review & Psychosocial Screening:  Initial Psych Review & Screening - 07/10/24 1108       Initial Review   Current issues with None Identified      Family Dynamics   Good Support System? Yes    Comments Pt has good support from friends and family      Barriers   Psychosocial barriers to participate in program There are no identifiable barriers or psychosocial needs.      Screening Interventions   Interventions Encouraged to exercise    Expected Outcomes Short Term goal: Utilizing psychosocial counselor, staff and physician to assist with identification of specific Stressors or current issues interfering with healing process. Setting desired goal for each stressor or current issue identified.;Long Term Goal: Stressors or current issues are controlled or eliminated.;Short Term goal: Identification and review with participant of any Quality of Life or Depression concerns found by scoring the questionnaire.;Long Term goal: The participant improves quality of Life and PHQ9 Scores as seen by post scores and/or verbalization of changes          Quality of Life Scores:  Scores of 19 and below usually indicate a poorer quality of life in these areas.  A difference of  2-3 points is a clinically meaningful difference.  A difference of 2-3 points in the total score  of the Quality of Life Index has been associated with significant improvement in overall quality of life, self-image, physical symptoms, and general health in studies assessing change in quality of life.  PHQ-9: Review Flowsheet  More data exists      07/10/2024 03/07/2024 12/29/2023 12/28/2022 11/24/2022  Depression screen PHQ 2/9  Decreased Interest 0 0 0 0 0  Down, Depressed, Hopeless 0 0 0 0 0  PHQ - 2 Score 0 0 0 0 0  Altered sleeping 0 0 0 - -  Tired, decreased energy 2 2 0 - -  Change in appetite 0 0 0 - -  Feeling bad or failure about yourself  0 2 0 - -  Trouble concentrating 0 1 0 - -  Moving slowly or fidgety/restless 0 2 0 - -  Suicidal thoughts 0 0 0 - -  PHQ-9 Score 2 7  0  - -  Difficult doing work/chores Somewhat difficult Not difficult at all Not difficult at all - -    Details       Data saved with a previous flowsheet row definition        Interpretation of Total Score  Total Score Depression Severity:  1-4 = Minimal depression, 5-9 = Mild depression, 10-14 = Moderate depression, 15-19 = Moderately severe depression, 20-27 = Severe depression   Psychosocial Evaluation and Intervention:  Psychosocial Evaluation - 07/10/24 1131       Psychosocial Evaluation & Interventions   Interventions Encouraged to exercise with the program and follow exercise prescription    Comments Amy Escobar denies any psy/soc barriers or concerns at this time. She has good support from her friends and family.    Expected Outcomes For Amy Escobar to participate in PR free of any psy/soc barriers or concerns    Continue Psychosocial Services  No Follow up required          Psychosocial Re-Evaluation:   Psychosocial Discharge (Final Psychosocial Re-Evaluation):   Education: Education Goals: Education classes will be provided on a weekly basis, covering required topics. Participant will state understanding/return demonstration of topics presented.  Learning Barriers/Preferences:  Learning  Barriers/Preferences - 07/10/24 1109  Learning Barriers/Preferences   Learning Barriers Sight    Learning Preferences None          Education Topics: Know Your Numbers Group instruction that is supported by a PowerPoint presentation. Instructor discusses importance of knowing and understanding resting, exercise, and post-exercise oxygen  saturation, heart rate, and blood pressure. Oxygen  saturation, heart rate, blood pressure, rating of perceived exertion, and dyspnea are reviewed along with a normal range for these values.    Exercise for the Pulmonary Patient Group instruction that is supported by a PowerPoint presentation. Instructor discusses benefits of exercise, core components of exercise, frequency, duration, and intensity of an exercise routine, importance of utilizing pulse oximetry during exercise, safety while exercising, and options of places to exercise outside of rehab.    MET Level  Group instruction provided by PowerPoint, verbal discussion, and written material to support subject matter. Instructor reviews what METs are and how to increase METs.    Pulmonary Medications Verbally interactive group education provided by instructor with focus on inhaled medications and proper administration.   Anatomy and Physiology of the Respiratory System Group instruction provided by PowerPoint, verbal discussion, and written material to support subject matter. Instructor reviews respiratory cycle and anatomical components of the respiratory system and their functions. Instructor also reviews differences in obstructive and restrictive respiratory diseases with examples of each.    Oxygen  Safety Group instruction provided by PowerPoint, verbal discussion, and written material to support subject matter. There is an overview of What is Oxygen  and Why do we need it.  Instructor also reviews how to create a safe environment for oxygen  use, the importance of using oxygen  as  prescribed, and the risks of noncompliance. There is a brief discussion on traveling with oxygen  and resources the patient may utilize.   Oxygen  Use Group instruction provided by PowerPoint, verbal discussion, and written material to discuss how supplemental oxygen  is prescribed and different types of oxygen  supply systems. Resources for more information are provided.    Breathing Techniques Group instruction that is supported by demonstration and informational handouts. Instructor discusses the benefits of pursed lip and diaphragmatic breathing and detailed demonstration on how to perform both.     Risk Factor Reduction Group instruction that is supported by a PowerPoint presentation. Instructor discusses the definition of a risk factor, different risk factors for pulmonary disease, and how the heart and lungs work together.   Pulmonary Diseases Group instruction provided by PowerPoint, verbal discussion, and written material to support subject matter. Instructor gives an overview of the different type of pulmonary diseases. There is also a discussion on risk factors and symptoms as well as ways to manage the diseases.   Stress and Energy Conservation Group instruction provided by PowerPoint, verbal discussion, and written material to support subject matter. Instructor gives an overview of stress and the impact it can have on the body. Instructor also reviews ways to reduce stress. There is also a discussion on energy conservation and ways to conserve energy throughout the day.   Warning Signs and Symptoms Group instruction provided by PowerPoint, verbal discussion, and written material to support subject matter. Instructor reviews warning signs and symptoms of stroke, heart attack, cold and flu. Instructor also reviews ways to prevent the spread of infection.   Other Education Group or individual verbal, written, or video instructions that support the educational goals of the pulmonary  rehab program.    Knowledge Questionnaire Score:  Knowledge Questionnaire Score - 07/10/24 1133       Knowledge  Questionnaire Score   Pre Score 13/18          Core Components/Risk Factors/Patient Goals at Admission:  Personal Goals and Risk Factors at Admission - 07/10/24 1109       Core Components/Risk Factors/Patient Goals on Admission    Weight Management Yes;Weight Loss    Intervention Weight Management: Develop a combined nutrition and exercise program designed to reach desired caloric intake, while maintaining appropriate intake of nutrient and fiber, sodium and fats, and appropriate energy expenditure required for the weight goal.;Weight Management: Provide education and appropriate resources to help participant work on and attain dietary goals.;Weight Management/Obesity: Establish reasonable short term and long term weight goals.;Obesity: Provide education and appropriate resources to help participant work on and attain dietary goals.    Admit Weight 158 lb 11.7 oz (72 kg)    Expected Outcomes Short Term: Continue to assess and modify interventions until short term weight is achieved;Long Term: Adherence to nutrition and physical activity/exercise program aimed toward attainment of established weight goal;Weight Loss: Understanding of general recommendations for a balanced deficit meal plan, which promotes 1-2 lb weight loss per week and includes a negative energy balance of [phone] kcal/d;Understanding recommendations for meals to include 15-35% energy as protein, 25-35% energy from fat, 35-60% energy from carbohydrates, less than 200mg  of dietary cholesterol, 20-35 gm of total fiber daily;Understanding of distribution of calorie intake throughout the day with the consumption of 4-5 meals/snacks    Improve shortness of breath with ADL's Yes    Intervention Provide education, individualized exercise plan and daily activity instruction to help decrease symptoms of SOB with activities  of daily living.    Expected Outcomes Short Term: Improve cardiorespiratory fitness to achieve a reduction of symptoms when performing ADLs;Long Term: Be able to perform more ADLs without symptoms or delay the onset of symptoms          Core Components/Risk Factors/Patient Goals Review:    Core Components/Risk Factors/Patient Goals at Discharge (Final Review):    ITP Comments:   Comments: Dr. Slater Staff is Medical Director for Pulmonary Rehab at [facility].       [1]  Current Outpatient Medications:    Accu-Chek Softclix Lancets lancets, Use to prick finger to check blood glucose, Disp: 100 each, Rfl: 12   aspirin  EC 81 MG tablet, Take 81 mg by mouth daily., Disp: , Rfl:    azelastine  (ASTELIN ) 0.1 % nasal spray, Place 2 sprays into both nostrils 2 (two) times daily., Disp: 30 mL, Rfl: 5   benzonatate  (TESSALON ) 200 MG capsule, TAKE 1 CAPSULE BY MOUTH 3 TIMES A DAY AS NEEDED FOR COUGH, Disp: 30 capsule, Rfl: 1   Blood Glucose Monitoring Suppl (ACCU-CHEK GUIDE) w/Device KIT, Use to check blood glucose daily as directed, Disp: 1 kit, Rfl: 0   Blood Glucose Monitoring Suppl (ONETOUCH VERIO FLEX SYSTEM) w/Device KIT, USE TO TEST BLOOD SUGAR DAILY AS DIRECTED, Disp: 1 kit, Rfl: 0   Calcium  Carb-Cholecalciferol (CALCIUM  + VITAMIN D3 PO), Take 1 tablet by mouth daily., Disp: , Rfl:    celecoxib  (CELEBREX ) 200 MG capsule, TAKE 1 CAPSULE BY MOUTH DAILY, Disp: 30 capsule, Rfl: 1   cephALEXin  (KEFLEX ) 250 MG capsule, Take 250 mg by mouth daily., Disp: , Rfl:    cetirizine  (ZYRTEC ) 10 MG tablet, Take 0.5 tablets (5 mg total) by mouth daily., Disp: 30 tablet, Rfl: 5   esomeprazole  (NEXIUM ) 40 MG capsule, Take 1 capsule (40 mg total) by mouth 2 (two) times daily before  a meal., Disp: 180 capsule, Rfl: 1   estradiol (ESTRACE) 0.1 MG/GM vaginal cream, Place 1 Applicatorful vaginally at bedtime., Disp: , Rfl:    fesoterodine  (TOVIAZ ) 4 MG TB24 tablet, Take 4 mg by mouth daily., Disp: ,  Rfl:    glucose blood (ACCU-CHEK GUIDE TEST) test strip, Use to test blood glucose daily, Disp: 300 each, Rfl: 12   glucose blood (ONETOUCH VERIO) test strip, USE TO CHECK BLOOD SUGAR TWO TIMES A DAY, Disp: 100 strip, Rfl: 3   lidocaine  (LIDODERM ) 5 %, APPLY 1 PATCH TO AFFECTED AREA FOR 12 HOURS IN A 24 HOUR PERIOD. DISCARD PATCH WITHIN 12 HOURS OR AS DIRECTED BY MD, Disp: 30 patch, Rfl: 1   montelukast  (SINGULAIR ) 10 MG tablet, TAKE 1 TABLET BY MOUTH EVERY NIGHT AT BEDTIME, Disp: 30 tablet, Rfl: 5   Multiple Vitamins-Minerals (ZINC PO), Take 1 tablet by mouth daily., Disp: , Rfl:    nerandomilast  (JASCAYD ) 18 MG tablet, Take 18 mg by mouth 2 (two) times daily., Disp: 60 tablet, Rfl: 4   Pirfenidone  267 MG TABS, Take 2 tablets (534 mg total) by mouth in the morning, at noon, and at bedtime. **low dose as maintenance**, Disp: 540 tablet, Rfl: 0   rosuvastatin  (CRESTOR ) 20 MG tablet, TAKE 1 TABLET BY MOUTH DAILY, Disp: 90 tablet, Rfl: 2   Semaglutide ,0.25 or 0.5MG /DOS, (OZEMPIC , 0.25 OR 0.5 MG/DOSE,) 2 MG/3ML SOPN, 0.5 mg by Other route once a week., Disp: 9 mL, Rfl: 3   valsartan  (DIOVAN ) 160 MG tablet, TAKE 1 TABLET BY MOUTH DAILY, Disp: 30 tablet, Rfl: 1   fluticasone  (FLONASE ) 50 MCG/ACT nasal spray, Place 2 sprays into both nostrils daily as needed for allergies or rhinitis., Disp: 16 g, Rfl: 5   gabapentin  (NEURONTIN ) 100 MG capsule, Take 1 capsule (100 mg total) by mouth at bedtime. (Patient not taking: Reported on 07/10/2024), Disp: 90 capsule, Rfl: 3   gabapentin  (NEURONTIN ) 300 MG capsule, Take 300 mg by mouth daily as needed (neuropathy). (Patient not taking: Reported on 07/10/2024), Disp: , Rfl:    OXYGEN , Inhale into the lungs. 2 liters (Patient not taking: Reported on 07/10/2024), Disp: , Rfl:  [2]  Social History Tobacco Use  Smoking Status Former   Current packs/day: 0.00   Average packs/day: 0.3 packs/day for 5.0 years (1.3 ttl pk-yrs)   Types: Cigarettes   Start date: 06/30/1963    Quit date: 06/29/1968   Years since quitting: 56.0  Smokeless Tobacco Never

## 2024-07-11 ENCOUNTER — Ambulatory Visit: Admitting: Family Medicine

## 2024-07-11 ENCOUNTER — Encounter: Payer: Self-pay | Admitting: Family Medicine

## 2024-07-11 VITALS — BP 138/88 | HR 96 | Temp 98.3°F | Ht 64.5 in | Wt 156.8 lb

## 2024-07-11 DIAGNOSIS — Z7985 Long-term (current) use of injectable non-insulin antidiabetic drugs: Secondary | ICD-10-CM | POA: Diagnosis not present

## 2024-07-11 DIAGNOSIS — N319 Neuromuscular dysfunction of bladder, unspecified: Secondary | ICD-10-CM | POA: Diagnosis not present

## 2024-07-11 DIAGNOSIS — J3089 Other allergic rhinitis: Secondary | ICD-10-CM | POA: Diagnosis not present

## 2024-07-11 DIAGNOSIS — E1142 Type 2 diabetes mellitus with diabetic polyneuropathy: Secondary | ICD-10-CM

## 2024-07-11 DIAGNOSIS — I10 Essential (primary) hypertension: Secondary | ICD-10-CM

## 2024-07-11 DIAGNOSIS — J302 Other seasonal allergic rhinitis: Secondary | ICD-10-CM

## 2024-07-11 DIAGNOSIS — J849 Interstitial pulmonary disease, unspecified: Secondary | ICD-10-CM | POA: Diagnosis not present

## 2024-07-11 DIAGNOSIS — Z7689 Persons encountering health services in other specified circumstances: Secondary | ICD-10-CM

## 2024-07-11 MED ORDER — GABAPENTIN 100 MG PO CAPS: 100.0000 mg | ORAL_CAPSULE | Freq: Every day | ORAL | 3 refills | Status: AC

## 2024-07-11 MED ORDER — GABAPENTIN 300 MG PO CAPS: 300.0000 mg | ORAL_CAPSULE | Freq: Every day | ORAL | 1 refills | Status: AC | PRN

## 2024-07-11 NOTE — Progress Notes (Signed)
 "  New Patient Visit  Subjective:     Patient ID: Amy Escobar, female    DOB: 1946-06-06, 79 y.o.   MRN: [id number]  Chief Complaint  Patient presents with   Establish Care    HPI  Discussed the use of AI scribe software for clinical note transcription with the patient, who gave verbal consent to proceed.  History of Present Illness Amy Escobar is a 79 year old female with lung disease who presents for management of her respiratory symptoms and medication refills.  Chronic respiratory symptoms - Chronic lung disease with allergies causing cough and drainage with white, clear sputum - Significant morning phlegm - No colored sputum - Breathing treatments improve symptoms in the afternoons - Recent respiratory medication caused headaches; awaiting further guidance - Starting respiratory therapy and feels stable - Uses home oxygen  but has not needed it recently - Recent oxygen  saturation was 95% - Two prior chest CT scans, with the second reported to be worse than the first - Enrolled in a pulmonary program at [facility] with follow up planned  Medication management - Takes gabapentin  100 mg and 300 mg; needs refills today - Difficulty maintaining a consistent prescriber for gabapentin  - Takes Coricidin for allergies with benefit - Previously used Mucinex  but stopped  Bladder dysfunction - Incomplete bladder emptying - Self-catheterizes four times daily - Stent was considered but deferred - Issues with the size of catheters supplied by a new vendor  Cardiac symptoms - No chest pain, shortness of breath, or palpitations - Cardiologist recently confirmed normal cardiac function     ROS Per HPI  Outpatient Encounter Medications as of 07/11/2024  Medication Sig   Accu-Chek Softclix Lancets lancets Use to prick finger to check blood glucose   aspirin  EC 81 MG tablet Take 81 mg by mouth daily.   azelastine  (ASTELIN ) 0.1 % nasal spray Place 2 sprays into both nostrils 2  (two) times daily.   benzonatate  (TESSALON ) 200 MG capsule TAKE 1 CAPSULE BY MOUTH 3 TIMES A DAY AS NEEDED FOR COUGH   Blood Glucose Monitoring Suppl (ACCU-CHEK GUIDE) w/Device KIT Use to check blood glucose daily as directed   Blood Glucose Monitoring Suppl (ONETOUCH VERIO FLEX SYSTEM) w/Device KIT USE TO TEST BLOOD SUGAR DAILY AS DIRECTED   Calcium  Carb-Cholecalciferol (CALCIUM  + VITAMIN D3 PO) Take 1 tablet by mouth daily.   celecoxib  (CELEBREX ) 200 MG capsule TAKE 1 CAPSULE BY MOUTH DAILY   cephALEXin  (KEFLEX ) 250 MG capsule Take 250 mg by mouth daily.   cetirizine  (ZYRTEC ) 10 MG tablet Take 0.5 tablets (5 mg total) by mouth daily.   esomeprazole  (NEXIUM ) 40 MG capsule Take 1 capsule (40 mg total) by mouth 2 (two) times daily before a meal.   estradiol (ESTRACE) 0.1 MG/GM vaginal cream Place 1 Applicatorful vaginally at bedtime.   fesoterodine  (TOVIAZ ) 4 MG TB24 tablet Take 4 mg by mouth daily.   fluticasone  (FLONASE ) 50 MCG/ACT nasal spray Place 2 sprays into both nostrils daily as needed for allergies or rhinitis.   glucose blood (ACCU-CHEK GUIDE TEST) test strip Use to test blood glucose daily   glucose blood (ONETOUCH VERIO) test strip USE TO CHECK BLOOD SUGAR TWO TIMES A DAY   lidocaine  (LIDODERM ) 5 % APPLY 1 PATCH TO AFFECTED AREA FOR 12 HOURS IN A 24 HOUR PERIOD. DISCARD PATCH WITHIN 12 HOURS OR AS DIRECTED BY MD   montelukast  (SINGULAIR ) 10 MG tablet TAKE 1 TABLET BY MOUTH EVERY NIGHT AT BEDTIME   Multiple  Vitamins-Minerals (ZINC PO) Take 1 tablet by mouth daily.   Pirfenidone  267 MG TABS Take 2 tablets (534 mg total) by mouth in the morning, at noon, and at bedtime. **low dose as maintenance**   rosuvastatin  (CRESTOR ) 20 MG tablet TAKE 1 TABLET BY MOUTH DAILY   Semaglutide ,0.25 or 0.5MG /DOS, (OZEMPIC , 0.25 OR 0.5 MG/DOSE,) 2 MG/3ML SOPN 0.5 mg by Other route once a week.   valsartan  (DIOVAN ) 160 MG tablet TAKE 1 TABLET BY MOUTH DAILY   gabapentin  (NEURONTIN ) 100 MG capsule Take 1  capsule (100 mg total) by mouth at bedtime.   gabapentin  (NEURONTIN ) 300 MG capsule Take 1 capsule (300 mg total) by mouth daily as needed (neuropathy).   OXYGEN  Inhale into the lungs. 2 liters (Patient not taking: Reported on 07/11/2024)   [DISCONTINUED] gabapentin  (NEURONTIN ) 100 MG capsule Take 1 capsule (100 mg total) by mouth at bedtime. (Patient not taking: Reported on 07/11/2024)   [DISCONTINUED] gabapentin  (NEURONTIN ) 300 MG capsule Take 300 mg by mouth daily as needed (neuropathy). (Patient not taking: Reported on 07/11/2024)   [DISCONTINUED] nerandomilast  (JASCAYD ) 18 MG tablet Take 18 mg by mouth 2 (two) times daily. (Patient not taking: Reported on 07/11/2024)   No facility-administered encounter medications on file as of 07/11/2024.    Past Medical History:  Diagnosis Date   Alkaline phosphatase deficiency    w/u Ne   Allergic rhinitis    Allergy     Anxiety    Arthritis    Cataract    BILATERAL-REMOVED   DM2 (diabetes mellitus, type 2) (HCC)    GERD (gastroesophageal reflux disease)    Gout    Hemorrhoids    Hyperlipidemia    Hypertension    Interstitial lung disease (HCC)    Mild pulmonary hypertension (HCC)    NSVT (nonsustained ventricular tachycardia) (HCC)    Osteoporosis    PVC's (premature ventricular contractions)    Thyroid  nodule    small   Tubular adenoma of colon 2017   Urticaria    Uterine fibroid     Past Surgical History:  Procedure Laterality Date   CATARACT EXTRACTION Bilateral 2016   COLONOSCOPY  2007   echocardiogram (other)  01/16/2002   POLYPECTOMY     removed tumors from foot nerves  04/1999   stress cardiolite   02/12/2006   TOENAIL EXCISION     TUBAL LIGATION     TYMPANOSTOMY TUBE PLACEMENT      Family History  Problem Relation Age of Onset   Heart disease Mother    Allergic rhinitis Mother    Heart disease Father    Stomach cancer Maternal Grandmother    Heart disease Maternal Grandfather    Rectal cancer Maternal Grandfather     Colon cancer Neg Hx    Breast cancer Neg Hx    Colon polyps Neg Hx    Esophageal cancer Neg Hx     Social History   Socioeconomic History   Marital status: Widowed    Spouse name: Not on file   Number of children: 2   Years of education: Not on file   Highest education level: Not on file  Occupational History   Occupation: OTC CLERK    Employer: UNEMPLOYED  Tobacco Use   Smoking status: Former    Current packs/day: 0.00    Average packs/day: 0.3 packs/day for 5.0 years (1.3 ttl pk-yrs)    Types: Cigarettes    Start date: 06/30/1963    Quit date: 06/29/1968    Years since quitting: 5.0  Smokeless tobacco: Never  Vaping Use   Vaping status: Never Used  Substance and Sexual Activity   Alcohol use: No    Alcohol/week: 0.0 standard drinks of alcohol   Drug use: No   Sexual activity: Not Currently    Birth control/protection: Post-menopausal  Other Topics Concern   Not on file  Social History Narrative   Widowed 2010. Lives alone   Social Drivers of Health   Tobacco Use: Medium Risk (07/11/2024)   Patient History    Smoking Tobacco Use: Former    Smokeless Tobacco Use: Never    Passive Exposure: Not on file  Financial Resource Strain: Medium Risk (02/23/2024)   Overall Financial Resource Strain (CARDIA)    Difficulty of Paying Living Expenses: Somewhat hard  Food Insecurity: No Food Insecurity (02/23/2024)   Epic    Worried About Radiation Protection Practitioner of Food in the Last Year: Never true    Ran Out of Food in the Last Year: Never true  Transportation Needs: No Transportation Needs (02/23/2024)   Epic    Lack of Transportation (Medical): No    Lack of Transportation (Non-Medical): No  Physical Activity: Insufficiently Active (12/29/2023)   Exercise Vital Sign    Days of Exercise per Week: 2 days    Minutes of Exercise per Session: 20 min  Stress: No Stress Concern Present (12/29/2023)   [employer] of Occupational Health - Occupational Stress Questionnaire    Feeling of  Stress: Not at all  Social Connections: Moderately Integrated (12/29/2023)   Social Connection and Isolation Panel    Frequency of Communication with Friends and Family: More than three times a week    Frequency of Social Gatherings with Friends and Family: More than three times a week    Attends Religious Services: More than 4 times per year    Active Member of [employer] or Organizations: Yes    Attends Banker Meetings: More than 4 times per year    Marital Status: Widowed  Intimate Partner Violence: Not At Risk (02/23/2024)   Epic    Fear of Current or Ex-Partner: No    Emotionally Abused: No    Physically Abused: No    Sexually Abused: No  Depression (PHQ2-9): Low Risk (07/10/2024)   Depression (PHQ2-9)    PHQ-2 Score: 2  Alcohol Screen: Low Risk (12/29/2023)   Alcohol Screen    Last Alcohol Screening Score (AUDIT): 0  Housing: Unknown (02/23/2024)   Epic    Unable to Pay for Housing in the Last Year: No    Number of Times Moved in the Last Year: Not on file    Homeless in the Last Year: No  Utilities: Not At Risk (02/23/2024)   Epic    Threatened with loss of utilities: No  Health Literacy: Adequate Health Literacy (12/29/2023)   B1300 Health Literacy    Frequency of need for help with medical instructions: Never       Objective:    BP 138/88   Pulse 96   Temp 98.3 F (36.8 C) (Temporal)   Ht 5' 4.5 (1.638 m)   Wt 156 lb 12.8 oz (71.1 kg)   SpO2 95%   BMI 26.50 kg/m    Physical Exam Vitals and nursing note reviewed.  Constitutional:      General: She is not in acute distress.    Appearance: Normal appearance. She is normal weight.  HENT:     Head: Normocephalic and atraumatic.     Right Ear: External ear  normal.     Left Ear: External ear normal.     Nose: Nose normal.     Mouth/Throat:     Mouth: Mucous membranes are moist.     Pharynx: Oropharynx is clear.  Eyes:     Extraocular Movements: Extraocular movements intact.     Pupils: Pupils are  equal, round, and reactive to light.  Cardiovascular:     Rate and Rhythm: Normal rate and regular rhythm.     Pulses: Normal pulses.     Heart sounds: Normal heart sounds.  Pulmonary:     Effort: Pulmonary effort is normal. No respiratory distress.     Breath sounds: Normal breath sounds. No wheezing, rhonchi or rales.  Musculoskeletal:        General: Normal range of motion.     Cervical back: Normal range of motion.     Right lower leg: No edema.     Left lower leg: No edema.  Lymphadenopathy:     Cervical: No cervical adenopathy.  Neurological:     General: No focal deficit present.     Mental Status: She is alert and oriented to person, place, and time.  Psychiatric:        Mood and Affect: Mood normal.        Thought Content: Thought content normal.     No results found for any visits on 07/11/24.      Assessment & Plan:   Assessment and Plan Assessment & Plan Establish Care Vaccinations and screenings up to date. Prefers phone communication over MyChart. - Continue routine vaccinations and screenings. - Use phone calls for communication if preferred.  ILD, seasonal and perennial allergic rhinitis Chronic cough with clear sputum, likely allergy -related. Managed with Coricidin twice a day - Is followed by Dr Geronimo with pulmonology - Continue Coricidin  - Continue singulair , azelastine , zyrtec  - Will be starting pulmonary rehab soon  Neurogenic bladder Chronic neurogenic bladder requiring self-catheterization.  - sees Dr McDairmid with [facility] - chronic, requires ongoing monitoring - She has contacted medical supply company to address catheter size. - Continue self-catheterization four times daily.  Essential Hypertension - chronic, stable, requires ongoing monitoring - Continue valsartan  - followed  by [facility] with cardiology  Diabetic polyneuropathy associated with type 2 diabetes mellitus -Chronic, stable, requiring ongoing  monitoring - Refilled gabapentin  100 mg 3 times daily as needed - Refill gabapentin  300 mg once daily at bedtime as needed - Does not combine the two doses, if she is doing well then she will take 100 mg 3 times a day. -If neuropathy has not improved throughout the day will take 100 mg in the morning and at lunch and then 300 mg once at bedtime  Type 2 diabetes mellitus with diabetic polyneuropathy, without long-term use of insulin , current long-term use of non-insulin  injectable - Continue to keep an eye on blood sugars at home - Chronic, requires continued monitoring - Continue semaglutide   Mixed hyperlipidemia - Chronic, stable, requires ongoing monitoring -Continue Crestor      No orders of the defined types were placed in this encounter.    Meds ordered this encounter  Medications   gabapentin  (NEURONTIN ) 100 MG capsule    Sig: Take 1 capsule (100 mg total) by mouth at bedtime.    Dispense:  90 capsule    Refill:  3    To replace the 300 mg dose   gabapentin  (NEURONTIN ) 300 MG capsule    Sig: Take 1 capsule (300 mg total)  by mouth daily as needed (neuropathy).    Dispense:  90 capsule    Refill:  1    Return in about 6 months (around 01/08/2025) for Meds eval.  [facility]an LITTIE Ku, FNP   "

## 2024-07-11 NOTE — Patient Instructions (Addendum)
 Welcome to [facility]!  Thank you for choosing us  for your Primary Care needs.   We offer in person and video appointments for your convenience. You may call our office to schedule appointments, or you may schedule appointments with me through MyChart.   The best way to get in contact with me is via MyChart message. This will get to me faster than a phone call, unless there is an emergency, then please call 911.  The lab is located downstairs in the Sports Medicine building, we also have xray available there.   Follow-up with me in 6 mos for medication management, sooner if needed.

## 2024-07-12 ENCOUNTER — Telehealth: Payer: Self-pay

## 2024-07-12 NOTE — Telephone Encounter (Signed)
 Copied from CRM [id number]. Topic: Clinical - Medical Advice >> Jul 12, 2024  3:45 PM Isabell A wrote: Reason for CRM: Patient returning phone call from [facility].  Callback number: [phone]   Duplicate

## 2024-07-12 NOTE — Telephone Encounter (Signed)
 Pt advised of plan She will restart at 1 tab per day x 1 week; then 2 per day x week. NFN

## 2024-07-18 ENCOUNTER — Encounter (HOSPITAL_COMMUNITY)
Admission: RE | Admit: 2024-07-18 | Discharge: 2024-07-18 | Disposition: A | Source: Ambulatory Visit | Attending: Internal Medicine

## 2024-07-18 VITALS — Wt 158.7 lb

## 2024-07-18 DIAGNOSIS — J849 Interstitial pulmonary disease, unspecified: Secondary | ICD-10-CM | POA: Diagnosis not present

## 2024-07-18 LAB — GLUCOSE, CAPILLARY
Glucose-Capillary: 97 mg/dL (ref 70–99)
Glucose-Capillary: 100 mg/dL — ABNORMAL HIGH (ref 70–99)

## 2024-07-18 NOTE — Progress Notes (Signed)
 Daily Session Note  Patient Details  Name: Amy Escobar MRN: [id number] Date of Birth: 1945/11/16 Referring Provider:   Flowsheet Row PULMONARY REHAB OTHER RESP ORIENTATION from 07/10/2024 in [facility]  Referring Provider Ramaswamy    Encounter Date: 07/18/2024  Check In:  Session Check In - 07/18/24 1107       Check-In   Supervising physician immediately available to respond to emergencies [facility] MD immediately available    Physician(s) Lum Louis, NP    Location [facility]    Staff Present Ronal Levin, RN, BSN;Lyne Khurana Claudene, Neita Moats, MS, ACSM-CEP, Exercise Physiologist;Randi Midge BS, ACSM-CEP, Exercise Physiologist    Virtual Visit No    Medication changes reported     No    Fall or balance concerns reported    No    Tobacco Cessation No Change    Warm-up and Cool-down Performed as group-led instruction    Resistance Training Performed Yes    VAD Patient? No    PAD/SET Patient? No      Pain Assessment   Currently in Pain? No/denies    Multiple Pain Sites No          Capillary Blood Glucose: Results for orders placed or performed during the hospital encounter of 07/18/24 (from the past 24 hours)  Glucose, capillary     Status: None   Collection Time: 07/18/24 10:10 AM  Result Value Ref Range   Glucose-Capillary 97 70 - 99 mg/dL  Glucose, capillary     Status: Abnormal   Collection Time: 07/18/24 11:30 AM  Result Value Ref Range   Glucose-Capillary 100 (H) 70 - 99 mg/dL   *Note: Due to a large number of results and/or encounters for the requested time period, some results have not been displayed. A complete set of results can be found in Results Review.     Exercise Prescription Changes - 07/18/24 1200       Response to Exercise   Blood Pressure (Admit) 144/72    Blood Pressure (Exercise) 132/84    Blood Pressure (Exit) 122/70    Heart Rate (Admit) 100 bpm    Heart  Rate (Exercise) 99 bpm    Heart Rate (Exit) 99 bpm    Oxygen  Saturation (Admit) 95 %    Oxygen  Saturation (Exercise) 97 %    Oxygen  Saturation (Exit) 96 %    Rating of Perceived Exertion (Exercise) 9    Perceived Dyspnea (Exercise) 1    Duration Continue with 30 min of aerobic exercise without signs/symptoms of physical distress.    Intensity THRR unchanged      Progression   Progression Continue to progress workloads to maintain intensity without signs/symptoms of physical distress.      Resistance Training   Weight red bands    Reps 10-15    Time 10 Minutes      NuStep   Level 1    SPM 42    Minutes 15    METs 1.1      Track   Laps 7    Minutes 15    METs 2.08          Tobacco Use History[1]  Goals Met:  Proper associated with RPD/PD & O2 Sat Independence with exercise equipment Exercise tolerated well No report of concerns or symptoms today Strength training completed today  Goals Unmet:  Not Applicable  Comments: Service time is from 1014 to 1134.  Dr. Slater Staff is Medical Director for Pulmonary Rehab at [facility].     [1]  Social History Tobacco Use  Smoking Status Former   Current packs/day: 0.00   Average packs/day: 0.3 packs/day for 5.0 years (1.3 ttl pk-yrs)   Types: Cigarettes   Start date: 06/30/1963   Quit date: 06/29/1968   Years since quitting: 56.0  Smokeless Tobacco Never

## 2024-07-19 ENCOUNTER — Telehealth: Payer: Self-pay | Admitting: Family Medicine

## 2024-07-19 NOTE — Telephone Encounter (Signed)
 Spoke with patient, is wondering if Corean is accepting new patients. Confirmed that she is, we are booking out until mid June. She gave verbal understanding

## 2024-07-19 NOTE — Progress Notes (Signed)
 Pulmonary Individual Treatment Plan  Patient Details  Name: Amy Escobar MRN: [id number] Date of Birth: 1946-06-04 Referring Provider:   Flowsheet Row PULMONARY REHAB OTHER RESP ORIENTATION from 07/10/2024 in [facility]  Referring Provider Ramaswamy    Initial Encounter Date:  Flowsheet Row PULMONARY REHAB OTHER RESP ORIENTATION from 07/10/2024 in [facility]  Date 07/10/24    Visit Diagnosis: ILD (interstitial lung disease) (HCC)  Patient's Home Medications on Admission:  Current Medications[1]  Past Medical History: Past Medical History:  Diagnosis Date   Alkaline phosphatase deficiency    w/u Ne   Allergic rhinitis    Allergy     Anxiety    Arthritis    Cataract    BILATERAL-REMOVED   DM2 (diabetes mellitus, type 2) (HCC)    GERD (gastroesophageal reflux disease)    Gout    Hemorrhoids    Hyperlipidemia    Hypertension    Interstitial lung disease (HCC)    Mild pulmonary hypertension (HCC)    NSVT (nonsustained ventricular tachycardia) (HCC)    Osteoporosis    PVC's (premature ventricular contractions)    Thyroid  nodule    small   Tubular adenoma of colon 2017   Urticaria    Uterine fibroid     Tobacco Use: Tobacco Use History[2]  Labs: Review Flowsheet  More data exists      Latest Ref Rng & Units 11/19/2022 03/08/2023 06/01/2023 09/07/2023 03/08/2024  Labs for ITP Cardiac and Pulmonary Rehab  Cholestrol 0 - 200 mg/dL 883  - 876  - -  LDL (calc) 0 - 99 mg/dL 58  - 71  - -  HDL-C >60.99 mg/dL 37  - 63.69  - -  Trlycerides 0.0 - 149.0 mg/dL 887  - 20.9  - -  Hemoglobin A1c 4.0 - 5.6 % 5.6  5.9  - 5.8  5.8     Capillary Blood Glucose: Lab Results  Component Value Date   GLUCAP 100 (H) 07/18/2024   GLUCAP 97 07/18/2024   GLUCAP 144 (H) 06/19/2023   GLUCAP 139 (H) 06/19/2023   GLUCAP 98 06/18/2023     Pulmonary Assessment Scores:   Pulmonary Assessment Scores     Row Name 07/10/24 1037         ADL UCSD   ADL Phase Entry     SOB Score total 39       CAT Score   CAT Score 21       mMRC Score   mMRC Score 3       UCSD: Self-administered rating of dyspnea associated with activities of daily living (ADLs) 6-point scale (0 = not at all to 5 = maximal or unable to do because of breathlessness)  Scoring Scores range from 0 to 120.  Minimally important difference is 5 units  CAT: CAT can identify the health impairment of COPD patients and is better correlated with disease progression.  CAT has a scoring range of zero to 40. The CAT score is classified into four groups of low (less than 10), medium (10 - 20), high (21-30) and very high (31-40) based on the impact level of disease on health status. A CAT score over 10 suggests significant symptoms.  A worsening CAT score could be explained by an exacerbation, poor medication adherence, poor inhaler technique, or progression of COPD or comorbid conditions.  CAT MCID is 2 points  mMRC: mMRC (Modified Medical  Research Council) Dyspnea Scale is used to assess the degree of baseline functional disability in patients of respiratory disease due to dyspnea. No minimal important difference is established. A decrease in score of 1 point or greater is considered a positive change.   Pulmonary Function Assessment:  Pulmonary Function Assessment - 07/10/24 1112       Breath   Bilateral Breath Sounds Clear    Shortness of Breath Yes;Limiting activity          Exercise Target Goals: Exercise Program Goal: Individual exercise prescription set using results from initial 6 min walk test and THRR while considering  patients activity barriers and safety.   Exercise Prescription Goal: Initial exercise prescription builds to 30-45 minutes a day of aerobic activity, 2-3 days per week.  Home exercise guidelines will be given to patient during program as part of exercise  prescription that the participant will acknowledge.  Activity Barriers & Risk Stratification:  Activity Barriers & Cardiac Risk Stratification - 07/10/24 1110       Activity Barriers & Cardiac Risk Stratification   Activity Barriers Deconditioning;Muscular Weakness;Shortness of Breath;Balance Concerns;Assistive Device    Cardiac Risk Stratification Moderate          6 Minute Walk:  6 Minute Walk     Row Name 07/10/24 1148         6 Minute Walk   Phase Initial     Distance 915 feet     Walk Time 6 minutes     # of Rest Breaks 0     MPH 1.73     METS 2.07     RPE 11.5     Perceived Dyspnea  0     VO2 Peak 7.26     Symptoms No     Resting HR 88 bpm     Resting BP 144/88     Resting Oxygen  Saturation  97 %     Exercise Oxygen  Saturation  during 6 min walk 89 %     Max Ex. HR 131 bpm     Max Ex. BP 130/80     2 Minute Post BP 126/80       Interval HR   1 Minute HR 131     2 Minute HR 95     3 Minute HR 117     4 Minute HR 104     5 Minute HR 91     6 Minute HR 94     2 Minute Post HR 88     Interval Heart Rate? Yes       Interval Oxygen    Interval Oxygen ? Yes     Baseline Oxygen  Saturation % 97 %     1 Minute Oxygen  Saturation % 93 %     1 Minute Liters of Oxygen  0 L     2 Minute Oxygen  Saturation % 91 %     2 Minute Liters of Oxygen  0 L     3 Minute Oxygen  Saturation % 89 %     3 Minute Liters of Oxygen  0 L     4 Minute Oxygen  Saturation % 90 %     4 Minute Liters of Oxygen  0 L     5 Minute Oxygen  Saturation % 91 %     5 Minute Liters of Oxygen  0 L     6 Minute Oxygen  Saturation % 91 %     6 Minute Liters of Oxygen  0 L     2 Minute Post  Oxygen  Saturation % 97 %     2 Minute Post Liters of Oxygen  0 L        Oxygen  Initial Assessment:  Oxygen  Initial Assessment - 07/10/24 1111       Home Oxygen    Home Oxygen  Device Home Concentrator;Portable Concentrator    Sleep Oxygen  Prescription Continuous   does not wear anymore   Liters per minute 2     Home Exercise Oxygen  Prescription Continuous    Liters per minute 2    Home Resting Oxygen  Prescription Continuous    Liters per minute 2    Compliance with Home Oxygen  Use Yes      Initial 6 min Walk   Oxygen  Used None      Program Oxygen  Prescription   Program Oxygen  Prescription None      Intervention   Short Term Goals To learn and exhibit compliance with exercise, home and travel O2 prescription;To learn and understand importance of maintaining oxygen  saturations>88%;To learn and demonstrate proper use of respiratory medications;To learn and understand importance of monitoring SPO2 with pulse oximeter and demonstrate accurate use of the pulse oximeter.;To learn and demonstrate proper pursed lip breathing techniques or other breathing techniques.     Long  Term Goals Exhibits compliance with exercise, home  and travel O2 prescription;Maintenance of O2 saturations>88%;Compliance with respiratory medication;Verbalizes importance of monitoring SPO2 with pulse oximeter and return demonstration;Exhibits proper breathing techniques, such as pursed lip breathing or other method taught during program session;Demonstrates proper use of MDIs          Oxygen  Re-Evaluation:  Oxygen  Re-Evaluation     Row Name 07/11/24 1638             Program Oxygen  Prescription   Program Oxygen  Prescription None         Home Oxygen    Home Oxygen  Device Home Concentrator;Portable Concentrator       Sleep Oxygen  Prescription Continuous  does not wear anymore       Liters per minute 2       Home Exercise Oxygen  Prescription Continuous       Liters per minute 2       Home Resting Oxygen  Prescription Continuous       Liters per minute 2       Compliance with Home Oxygen  Use Yes         Goals/Expected Outcomes   Short Term Goals To learn and exhibit compliance with exercise, home and travel O2 prescription;To learn and understand importance of maintaining oxygen  saturations>88%;To learn and demonstrate  proper use of respiratory medications;To learn and understand importance of monitoring SPO2 with pulse oximeter and demonstrate accurate use of the pulse oximeter.;To learn and demonstrate proper pursed lip breathing techniques or other breathing techniques.        Long  Term Goals Exhibits compliance with exercise, home  and travel O2 prescription;Maintenance of O2 saturations>88%;Compliance with respiratory medication;Verbalizes importance of monitoring SPO2 with pulse oximeter and return demonstration;Exhibits proper breathing techniques, such as pursed lip breathing or other method taught during program session;Demonstrates proper use of MDIs       Goals/Expected Outcomes Compliance and understanding of oxygen  saturation monitoring and breathing techniques to decrease shortness of breath.          Oxygen  Discharge (Final Oxygen  Re-Evaluation):  Oxygen  Re-Evaluation - 07/11/24 1638       Program Oxygen  Prescription   Program Oxygen  Prescription None      Home Oxygen    Home Oxygen  Device Home  Concentrator;Portable Concentrator    Sleep Oxygen  Prescription Continuous   does not wear anymore   Liters per minute 2    Home Exercise Oxygen  Prescription Continuous    Liters per minute 2    Home Resting Oxygen  Prescription Continuous    Liters per minute 2    Compliance with Home Oxygen  Use Yes      Goals/Expected Outcomes   Short Term Goals To learn and exhibit compliance with exercise, home and travel O2 prescription;To learn and understand importance of maintaining oxygen  saturations>88%;To learn and demonstrate proper use of respiratory medications;To learn and understand importance of monitoring SPO2 with pulse oximeter and demonstrate accurate use of the pulse oximeter.;To learn and demonstrate proper pursed lip breathing techniques or other breathing techniques.     Long  Term Goals Exhibits compliance with exercise, home  and travel O2 prescription;Maintenance of O2  saturations>88%;Compliance with respiratory medication;Verbalizes importance of monitoring SPO2 with pulse oximeter and return demonstration;Exhibits proper breathing techniques, such as pursed lip breathing or other method taught during program session;Demonstrates proper use of MDIs    Goals/Expected Outcomes Compliance and understanding of oxygen  saturation monitoring and breathing techniques to decrease shortness of breath.          Initial Exercise Prescription:  Initial Exercise Prescription - 07/10/24 1100       Date of Initial Exercise RX and Referring Provider   Date 07/10/24    Referring Provider Ramaswamy    Expected Discharge Date 10/03/24      NuStep   Level 1    SPM 55    Minutes 15    METs 1.5      Track   Minutes 15    METs 2      Prescription Details   Frequency (times per week) 2    Duration Progress to 30 minutes of continuous aerobic without signs/symptoms of physical distress      Intensity   THRR 40-80% of Max Heartrate 57-114    Ratings of Perceived Exertion 11-13    Perceived Dyspnea 0-4      Progression   Progression Continue to progress workloads to maintain intensity without signs/symptoms of physical distress.      Resistance Training   Training Prescription Yes    Weight red bands    Reps 10-15          Perform Capillary Blood Glucose checks as needed.  Exercise Prescription Changes:   Exercise Prescription Changes     Row Name 07/18/24 1200             Response to Exercise   Blood Pressure (Admit) 144/72       Blood Pressure (Exercise) 132/84       Blood Pressure (Exit) 122/70       Heart Rate (Admit) 100 bpm       Heart Rate (Exercise) 99 bpm       Heart Rate (Exit) 99 bpm       Oxygen  Saturation (Admit) 95 %       Oxygen  Saturation (Exercise) 97 %       Oxygen  Saturation (Exit) 96 %       Rating of Perceived Exertion (Exercise) 9       Perceived Dyspnea (Exercise) 1       Duration Continue with 30 min of aerobic  exercise without signs/symptoms of physical distress.       Intensity THRR unchanged         Progression   Progression Continue  to progress workloads to maintain intensity without signs/symptoms of physical distress.         Resistance Training   Weight red bands       Reps 10-15       Time 10 Minutes         NuStep   Level 1       SPM 42       Minutes 15       METs 1.1         Track   Laps 7       Minutes 15       METs 2.08          Exercise Comments:   Exercise Goals and Review:   Exercise Goals     Row Name 07/10/24 1031             Exercise Goals   Increase Physical Activity Yes       Intervention Provide advice, education, support and counseling about physical activity/exercise needs.;Develop an individualized exercise prescription for aerobic and resistive training based on initial evaluation findings, risk stratification, comorbidities and participant's personal goals.       Expected Outcomes Short Term: Attend rehab on a regular basis to increase amount of physical activity.;Long Term: Exercising regularly at least 3-5 days a week.;Long Term: Add in home exercise to make exercise part of routine and to increase amount of physical activity.       Increase Strength and Stamina Yes       Intervention Provide advice, education, support and counseling about physical activity/exercise needs.;Develop an individualized exercise prescription for aerobic and resistive training based on initial evaluation findings, risk stratification, comorbidities and participant's personal goals.       Expected Outcomes Short Term: Increase workloads from initial exercise prescription for resistance, speed, and METs.;Short Term: Perform resistance training exercises routinely during rehab and add in resistance training at home;Long Term: Improve cardiorespiratory fitness, muscular endurance and strength as measured by increased METs and functional capacity ( )       Able to understand  and use rate of perceived exertion (RPE) scale Yes       Intervention Provide education and explanation on how to use RPE scale       Expected Outcomes Short Term: Able to use RPE daily in rehab to express subjective intensity level;Long Term:  Able to use RPE to guide intensity level when exercising independently       Able to understand and use Dyspnea scale Yes       Intervention Provide education and explanation on how to use Dyspnea scale       Expected Outcomes Short Term: Able to use Dyspnea scale daily in rehab to express subjective sense of shortness of breath during exertion;Long Term: Able to use Dyspnea scale to guide intensity level when exercising independently       Knowledge and understanding of Target Heart Rate Range (THRR) Yes       Intervention Provide education and explanation of THRR including how the numbers were predicted and where they are located for reference       Expected Outcomes Short Term: Able to state/look up THRR;Long Term: Able to use THRR to govern intensity when exercising independently;Short Term: Able to use daily as guideline for intensity in rehab       Understanding of Exercise Prescription Yes       Intervention Provide education, explanation, and written materials on patient's individual exercise prescription  Expected Outcomes Short Term: Able to explain program exercise prescription;Long Term: Able to explain home exercise prescription to exercise independently          Exercise Goals Re-Evaluation :  Exercise Goals Re-Evaluation     Row Name 07/11/24 1637             Exercise Goal Re-Evaluation   Exercise Goals Review Increase Physical Activity;Able to understand and use Dyspnea scale;Understanding of Exercise Prescription;Increase Strength and Stamina;Knowledge and understanding of Target Heart Rate Range (THRR);Able to understand and use rate of perceived exertion (RPE) scale       Comments Amy Escobar is scheduled to begin exercise on 1/20.  Will continue to monitor and progress as able.       Expected Outcomes Through exercise at rehab and home, the patient will decrease shortness of breath with daily activities and feel confident in carrying out an exercise regimen at home.          Discharge Exercise Prescription (Final Exercise Prescription Changes):  Exercise Prescription Changes - 07/18/24 1200       Response to Exercise   Blood Pressure (Admit) 144/72    Blood Pressure (Exercise) 132/84    Blood Pressure (Exit) 122/70    Heart Rate (Admit) 100 bpm    Heart Rate (Exercise) 99 bpm    Heart Rate (Exit) 99 bpm    Oxygen  Saturation (Admit) 95 %    Oxygen  Saturation (Exercise) 97 %    Oxygen  Saturation (Exit) 96 %    Rating of Perceived Exertion (Exercise) 9    Perceived Dyspnea (Exercise) 1    Duration Continue with 30 min of aerobic exercise without signs/symptoms of physical distress.    Intensity THRR unchanged      Progression   Progression Continue to progress workloads to maintain intensity without signs/symptoms of physical distress.      Resistance Training   Weight red bands    Reps 10-15    Time 10 Minutes      NuStep   Level 1    SPM 42    Minutes 15    METs 1.1      Track   Laps 7    Minutes 15    METs 2.08          Nutrition:  Target Goals: Understanding of nutrition guidelines, daily intake of sodium 1500mg , cholesterol 200mg , calories 30% from fat and 7% or less from saturated fats, daily to have 5 or more servings of fruits and vegetables.  Biometrics:    Nutrition Therapy Plan and Nutrition Goals:  Nutrition Therapy & Goals - 07/18/24 1055       Nutrition Therapy   Diet Diabetic diet      Personal Nutrition Goals   Nutrition Goal Patient to improve diet quality by using the plate method as a guide for meal planning to include lean protein/plant protein, fruits, vegetables, whole grains, nonfat dairy as part of a well-balanced diet.    Personal Goal #2 Patient to identify  strategies for weight loss with goal of 0.5-2 # per week of weight loss.    Comments Patient with medical history significant for ILD, DM2, HTN, GERD, mixed hyperlipidemia. Pt endorses well-controlled blood glucose; most recent A1c 5.8% on 03/08/2024. Weight stable over past year. Current BMI of 26.5 kg/m2 within appropriate range of 23-27 kg/m2 for >= 65 years. Pt expresses desire to lose weight with goal of 140 lb. Pt reports making dietary changes to help support weight loss such  as increasing intake of fruit/veggies. RD discussed importance of including lean protein, healthy fat with meals/snacks to help support glycemic control and manage hunger. RD provided list of pre-workout snacks as requested by pt.      Intervention Plan   Intervention Prescribe, educate and counsel regarding individualized specific dietary modifications aiming towards targeted core components such as weight, hypertension, lipid management, diabetes, heart failure and other comorbidities.;Nutrition handout(s) given to patient.   Handouts: Nutrition Tips for Better Breathing, American Diabetes Association: Smart Snacks   Expected Outcomes Short Term Goal: Understand basic principles of dietary content, such as calories, fat, sodium, cholesterol and nutrients.;Long Term Goal: Adherence to prescribed nutrition plan.          Nutrition Assessments:  MEDIFICTS Score Key: >=70 Need to make dietary changes  40-70 Heart Healthy Diet <= 40 Therapeutic Level Cholesterol Diet   Picture Your Plate Scores: <59 Unhealthy dietary pattern with much room for improvement. 41-50 Dietary pattern unlikely to meet recommendations for good health and room for improvement. 51-60 More healthful dietary pattern, with some room for improvement.  >60 Healthy dietary pattern, although there may be some specific behaviors that could be improved.    Nutrition Goals Re-Evaluation:   Nutrition Goals Discharge (Final Nutrition Goals  Re-Evaluation):   Psychosocial: Target Goals: Acknowledge presence or absence of significant depression and/or stress, maximize coping skills, provide positive support system. Participant is able to verbalize types and ability to use techniques and skills needed for reducing stress and depression.  Initial Review & Psychosocial Screening:  Initial Psych Review & Screening - 07/10/24 1108       Initial Review   Current issues with None Identified      Family Dynamics   Good Support System? Yes    Comments Pt has good support from friends and family      Barriers   Psychosocial barriers to participate in program There are no identifiable barriers or psychosocial needs.      Screening Interventions   Interventions Encouraged to exercise    Expected Outcomes Short Term goal: Utilizing psychosocial counselor, staff and physician to assist with identification of specific Stressors or current issues interfering with healing process. Setting desired goal for each stressor or current issue identified.;Long Term Goal: Stressors or current issues are controlled or eliminated.;Short Term goal: Identification and review with participant of any Quality of Life or Depression concerns found by scoring the questionnaire.;Long Term goal: The participant improves quality of Life and PHQ9 Scores as seen by post scores and/or verbalization of changes          Quality of Life Scores:  Scores of 19 and below usually indicate a poorer quality of life in these areas.  A difference of  2-3 points is a clinically meaningful difference.  A difference of 2-3 points in the total score of the Quality of Life Index has been associated with significant improvement in overall quality of life, self-image, physical symptoms, and general health in studies assessing change in quality of life.  PHQ-9: Review Flowsheet  More data exists      07/10/2024 03/07/2024 12/29/2023 12/28/2022 11/24/2022  Depression screen PHQ 2/9   Decreased Interest 0 0 0 0 0  Down, Depressed, Hopeless 0 0 0 0 0  PHQ - 2 Score 0 0 0 0 0  Altered sleeping 0 0 0 - -  Tired, decreased energy 2 2 0 - -  Change in appetite 0 0 0 - -  Feeling bad or failure  about yourself  0 2 0 - -  Trouble concentrating 0 1 0 - -  Moving slowly or fidgety/restless 0 2 0 - -  Suicidal thoughts 0 0 0 - -  PHQ-9 Score 2 7  0  - -  Difficult doing work/chores Somewhat difficult Not difficult at all Not difficult at all - -    Details       Data saved with a previous flowsheet row definition        Interpretation of Total Score  Total Score Depression Severity:  1-4 = Minimal depression, 5-9 = Mild depression, 10-14 = Moderate depression, 15-19 = Moderately severe depression, 20-27 = Severe depression   Psychosocial Evaluation and Intervention:  Psychosocial Evaluation - 07/10/24 1131       Psychosocial Evaluation & Interventions   Interventions Encouraged to exercise with the program and follow exercise prescription    Comments Amy Escobar denies any psy/soc barriers or concerns at this time. She has good support from her friends and family.    Expected Outcomes For Amy Escobar to participate in PR free of any psy/soc barriers or concerns    Continue Psychosocial Services  No Follow up required          Psychosocial Re-Evaluation:  Psychosocial Re-Evaluation     Row Name 07/12/24 1006             Psychosocial Re-Evaluation   Current issues with None Identified       Comments Amy Escobar is scheduled to start the PR program on 07/18/24. No new barriers or concerns since orientation.       Expected Outcomes For Amy Escobar to participate in PR free of any psy/soc barriers or concerns       Interventions Encouraged to attend Pulmonary Rehabilitation for the exercise       Continue Psychosocial Services  No Follow up required          Psychosocial Discharge (Final Psychosocial Re-Evaluation):  Psychosocial Re-Evaluation - 07/12/24 1006       Psychosocial  Re-Evaluation   Current issues with None Identified    Comments Amy Escobar is scheduled to start the PR program on 07/18/24. No new barriers or concerns since orientation.    Expected Outcomes For Shanya to participate in PR free of any psy/soc barriers or concerns    Interventions Encouraged to attend Pulmonary Rehabilitation for the exercise    Continue Psychosocial Services  No Follow up required          Education: Education Goals: Education classes will be provided on a weekly basis, covering required topics. Participant will state understanding/return demonstration of topics presented.  Learning Barriers/Preferences:  Learning Barriers/Preferences - 07/10/24 1109       Learning Barriers/Preferences   Learning Barriers Sight    Learning Preferences None          Education Topics: [employer] instruction that is supported by a PowerPoint presentation. Instructor discusses importance of knowing and understanding resting, exercise, and post-exercise oxygen  saturation, heart rate, and blood pressure. Oxygen  saturation, heart rate, blood pressure, rating of perceived exertion, and dyspnea are reviewed along with a normal range for these values.    Exercise for the Pulmonary Patient Group instruction that is supported by a PowerPoint presentation. Instructor discusses benefits of exercise, core components of exercise, frequency, duration, and intensity of an exercise routine, importance of utilizing pulse oximetry during exercise, safety while exercising, and options of places to exercise outside of rehab.    MET Level  Group instruction provided by PowerPoint, verbal discussion, and written material to support subject matter. Instructor reviews what METs are and how to increase METs.    Pulmonary Medications Verbally interactive group education provided by instructor with focus on inhaled medications and proper administration.   Anatomy and Physiology of the Respiratory  System Group instruction provided by PowerPoint, verbal discussion, and written material to support subject matter. Instructor reviews respiratory cycle and anatomical components of the respiratory system and their functions. Instructor also reviews differences in obstructive and restrictive respiratory diseases with examples of each.    Oxygen  Safety Group instruction provided by PowerPoint, verbal discussion, and written material to support subject matter. There is an overview of What is Oxygen  and Why do we need it.  Instructor also reviews how to create a safe environment for oxygen  use, the importance of using oxygen  as prescribed, and the risks of noncompliance. There is a brief discussion on traveling with oxygen  and resources the patient may utilize.   Oxygen  Use Group instruction provided by PowerPoint, verbal discussion, and written material to discuss how supplemental oxygen  is prescribed and different types of oxygen  supply systems. Resources for more information are provided.    Breathing Techniques Group instruction that is supported by demonstration and informational handouts. Instructor discusses the benefits of pursed lip and diaphragmatic breathing and detailed demonstration on how to perform both.     Risk Factor Reduction Group instruction that is supported by a PowerPoint presentation. Instructor discusses the definition of a risk factor, different risk factors for pulmonary disease, and how the heart and lungs work together.   Pulmonary Diseases Group instruction provided by PowerPoint, verbal discussion, and written material to support subject matter. Instructor gives an overview of the different type of pulmonary diseases. There is also a discussion on risk factors and symptoms as well as ways to manage the diseases.   Stress and Energy Conservation Group instruction provided by PowerPoint, verbal discussion, and written material to support subject matter.  Instructor gives an overview of stress and the impact it can have on the body. Instructor also reviews ways to reduce stress. There is also a discussion on energy conservation and ways to conserve energy throughout the day.   Warning Signs and Symptoms Group instruction provided by PowerPoint, verbal discussion, and written material to support subject matter. Instructor reviews warning signs and symptoms of stroke, heart attack, cold and flu. Instructor also reviews ways to prevent the spread of infection.   Other Education Group or individual verbal, written, or video instructions that support the educational goals of the pulmonary rehab program.    Knowledge Questionnaire Score:  Knowledge Questionnaire Score - 07/10/24 1133       Knowledge Questionnaire Score   Pre Score 13/18          Core Components/Risk Factors/Patient Goals at Admission:  Personal Goals and Risk Factors at Admission - 07/10/24 1109       Core Components/Risk Factors/Patient Goals on Admission    Weight Management Yes;Weight Loss    Intervention Weight Management: Develop a combined nutrition and exercise program designed to reach desired caloric intake, while maintaining appropriate intake of nutrient and fiber, sodium and fats, and appropriate energy expenditure required for the weight goal.;Weight Management: Provide education and appropriate resources to help participant work on and attain dietary goals.;Weight Management/Obesity: Establish reasonable short term and long term weight goals.;Obesity: Provide education and appropriate resources to help participant work on and attain dietary goals.    Admit Weight  158 lb 11.7 oz (72 kg)    Expected Outcomes Short Term: Continue to assess and modify interventions until short term weight is achieved;Long Term: Adherence to nutrition and physical activity/exercise program aimed toward attainment of established weight goal;Weight Loss: Understanding of general  recommendations for a balanced deficit meal plan, which promotes 1-2 lb weight loss per week and includes a negative energy balance of [id number] kcal/d;Understanding recommendations for meals to include 15-35% energy as protein, 25-35% energy from fat, 35-60% energy from carbohydrates, less than 200mg  of dietary cholesterol, 20-35 gm of total fiber daily;Understanding of distribution of calorie intake throughout the day with the consumption of 4-5 meals/snacks    Improve shortness of breath with ADL's Yes    Intervention Provide education, individualized exercise plan and daily activity instruction to help decrease symptoms of SOB with activities of daily living.    Expected Outcomes Short Term: Improve cardiorespiratory fitness to achieve a reduction of symptoms when performing ADLs;Long Term: Be able to perform more ADLs without symptoms or delay the onset of symptoms          Core Components/Risk Factors/Patient Goals Review:   Goals and Risk Factor Review     Row Name 07/12/24 1008             Core Components/Risk Factors/Patient Goals Review   Personal Goals Review Weight Management/Obesity;Improve shortness of breath with ADL's;Develop more efficient breathing techniques such as purse lipped breathing and diaphragmatic breathing and practicing self-pacing with activity.       Review Monthly review of patients Core Components/Risk Factors/Patient Goals are as follows: Amy Escobar is scheduled to start the PR program on 07/18/24. Unable to asses her goals at this time.       Expected Outcomes To improve shortness of breath with ADL's, lose weight and develop more efficient breathing techniques such as purse lipped breathing and diaphragmatic breathing; and practicing self-pacing with activity.          Core Components/Risk Factors/Patient Goals at Discharge (Final Review):   Goals and Risk Factor Review - 07/12/24 1008       Core Components/Risk Factors/Patient Goals Review   Personal Goals  Review Weight Management/Obesity;Improve shortness of breath with ADL's;Develop more efficient breathing techniques such as purse lipped breathing and diaphragmatic breathing and practicing self-pacing with activity.    Review Monthly review of patients Core Components/Risk Factors/Patient Goals are as follows: Amy Escobar is scheduled to start the PR program on 07/18/24. Unable to asses her goals at this time.    Expected Outcomes To improve shortness of breath with ADL's, lose weight and develop more efficient breathing techniques such as purse lipped breathing and diaphragmatic breathing; and practicing self-pacing with activity.          ITP Comments:Pt is making expected progress toward Pulmonary Rehab goals after completing 1 session(s). Recommend continued exercise, life style modification, education, and utilization of breathing techniques to increase stamina and strength, while also decreasing shortness of breath with exertion.  Dr. Slater Staff is Medical Director for Pulmonary Rehab at [facility].          [1]  Current Outpatient Medications:    Accu-Chek Softclix Lancets lancets, Use to prick finger to check blood glucose, Disp: 100 each, Rfl: 12   aspirin  EC 81 MG tablet, Take 81 mg by mouth daily., Disp: , Rfl:    azelastine  (ASTELIN ) 0.1 % nasal spray, Place 2 sprays into both nostrils 2 (two) times daily., Disp: 30 mL, Rfl: 5   benzonatate  (  TESSALON ) 200 MG capsule, TAKE 1 CAPSULE BY MOUTH 3 TIMES A DAY AS NEEDED FOR COUGH, Disp: 30 capsule, Rfl: 1   Blood Glucose Monitoring Suppl (ACCU-CHEK GUIDE) w/Device KIT, Use to check blood glucose daily as directed, Disp: 1 kit, Rfl: 0   Blood Glucose Monitoring Suppl (ONETOUCH VERIO FLEX SYSTEM) w/Device KIT, USE TO TEST BLOOD SUGAR DAILY AS DIRECTED, Disp: 1 kit, Rfl: 0   Calcium  Carb-Cholecalciferol (CALCIUM  + VITAMIN D3 PO), Take 1 tablet by mouth daily., Disp: , Rfl:    celecoxib  (CELEBREX ) 200 MG capsule, TAKE 1 CAPSULE BY  MOUTH DAILY, Disp: 30 capsule, Rfl: 1   cephALEXin  (KEFLEX ) 250 MG capsule, Take 250 mg by mouth daily., Disp: , Rfl:    cetirizine  (ZYRTEC ) 10 MG tablet, Take 0.5 tablets (5 mg total) by mouth daily., Disp: 30 tablet, Rfl: 5   esomeprazole  (NEXIUM ) 40 MG capsule, Take 1 capsule (40 mg total) by mouth 2 (two) times daily before a meal., Disp: 180 capsule, Rfl: 1   estradiol (ESTRACE) 0.1 MG/GM vaginal cream, Place 1 Applicatorful vaginally at bedtime., Disp: , Rfl:    fesoterodine  (TOVIAZ ) 4 MG TB24 tablet, Take 4 mg by mouth daily., Disp: , Rfl:    fluticasone  (FLONASE ) 50 MCG/ACT nasal spray, Place 2 sprays into both nostrils daily as needed for allergies or rhinitis., Disp: 16 g, Rfl: 5   gabapentin  (NEURONTIN ) 100 MG capsule, Take 1 capsule (100 mg total) by mouth at bedtime., Disp: 90 capsule, Rfl: 3   gabapentin  (NEURONTIN ) 300 MG capsule, Take 1 capsule (300 mg total) by mouth daily as needed (neuropathy)., Disp: 90 capsule, Rfl: 1   glucose blood (ACCU-CHEK GUIDE TEST) test strip, Use to test blood glucose daily, Disp: 300 each, Rfl: 12   glucose blood (ONETOUCH VERIO) test strip, USE TO CHECK BLOOD SUGAR TWO TIMES A DAY, Disp: 100 strip, Rfl: 3   lidocaine  (LIDODERM ) 5 %, APPLY 1 PATCH TO AFFECTED AREA FOR 12 HOURS IN A 24 HOUR PERIOD. DISCARD PATCH WITHIN 12 HOURS OR AS DIRECTED BY MD, Disp: 30 patch, Rfl: 1   montelukast  (SINGULAIR ) 10 MG tablet, TAKE 1 TABLET BY MOUTH EVERY NIGHT AT BEDTIME, Disp: 30 tablet, Rfl: 5   Multiple Vitamins-Minerals (ZINC PO), Take 1 tablet by mouth daily., Disp: , Rfl:    OXYGEN , Inhale into the lungs. 2 liters (Patient not taking: Reported on 07/11/2024), Disp: , Rfl:    Pirfenidone  267 MG TABS, Take 2 tablets (534 mg total) by mouth in the morning, at noon, and at bedtime. **low dose as maintenance**, Disp: 540 tablet, Rfl: 0   rosuvastatin  (CRESTOR ) 20 MG tablet, TAKE 1 TABLET BY MOUTH DAILY, Disp: 90 tablet, Rfl: 2   Semaglutide ,0.25 or 0.5MG /DOS,  (OZEMPIC , 0.25 OR 0.5 MG/DOSE,) 2 MG/3ML SOPN, 0.5 mg by Other route once a week., Disp: 9 mL, Rfl: 3   valsartan  (DIOVAN ) 160 MG tablet, TAKE 1 TABLET BY MOUTH DAILY, Disp: 30 tablet, Rfl: 1 [2]  Social History Tobacco Use  Smoking Status Former   Current packs/day: 0.00   Average packs/day: 0.3 packs/day for 5.0 years (1.3 ttl pk-yrs)   Types: Cigarettes   Start date: 06/30/1963   Quit date: 06/29/1968   Years since quitting: 56.0  Smokeless Tobacco Never

## 2024-07-19 NOTE — Telephone Encounter (Signed)
 Copied from CRM #[id number]. Topic: General - Other >> Jul 19, 2024  2:21 PM Alfonso HERO wrote: Reason for CRM: patient calling asking for Corean to call her so she can discuss something with her. She didn't want to give details.

## 2024-07-20 ENCOUNTER — Encounter (HOSPITAL_COMMUNITY)
Admission: RE | Admit: 2024-07-20 | Discharge: 2024-07-20 | Disposition: A | Discharge status: Home / Self Care | Source: Ambulatory Visit | Attending: Internal Medicine | Admitting: Internal Medicine

## 2024-07-20 DIAGNOSIS — J849 Interstitial pulmonary disease, unspecified: Secondary | ICD-10-CM

## 2024-07-20 LAB — GLUCOSE, CAPILLARY
Glucose-Capillary: 107 mg/dL — ABNORMAL HIGH (ref 70–99)
Glucose-Capillary: 103 mg/dL — ABNORMAL HIGH (ref 70–99)

## 2024-07-20 NOTE — Progress Notes (Signed)
 Daily Session Note  Patient Details  Name: Amy Escobar MRN: [id number] Date of Birth: [birth year] Referring Provider:   Flowsheet Row PULMONARY REHAB OTHER RESP ORIENTATION from 07/10/2024 in [facility]  Referring Provider Ramaswamy    Encounter Date: 07/20/2024  Check In:  Session Check In - 07/20/24 1034       Check-In   Supervising physician immediately available to respond to emergencies [facility] MD immediately available    Physician(s) Barnie Press, NP    Location [facility]    Staff Present Ronal Levin, RN, BSN;Linnie Mcglocklin Claudene, Neita Moats, MS, ACSM-CEP, Exercise Physiologist;Randi Midge HECKLE, ACSM-CEP, Exercise Physiologist    Virtual Visit No    Medication changes reported     No    Fall or balance concerns reported    No    Tobacco Cessation No Change    Warm-up and Cool-down Performed as group-led instruction    Resistance Training Performed Yes    VAD Patient? No    PAD/SET Patient? No      Pain Assessment   Currently in Pain? No/denies    Multiple Pain Sites No          Capillary Blood Glucose: Results for orders placed or performed during the hospital encounter of 07/20/24 (from the past 24 hours)  Glucose, capillary     Status: Abnormal   Collection Time: 07/20/24 10:06 AM  Result Value Ref Range   Glucose-Capillary 107 (H) 70 - 99 mg/dL  Glucose, capillary     Status: Abnormal   Collection Time: 07/20/24 11:40 AM  Result Value Ref Range   Glucose-Capillary 103 (H) 70 - 99 mg/dL   *Note: Due to a large number of results and/or encounters for the requested time period, some results have not been displayed. A complete set of results can be found in Results Review.      Tobacco Use History[1]  Goals Met:  Proper associated with RPD/PD & O2 Sat Independence with exercise equipment Exercise tolerated well No report of concerns or symptoms today Strength training  completed today  Goals Unmet:  Not Applicable  Comments: Service time is from 1005 to 1140.    Dr. Slater Staff is Medical Director for Pulmonary Rehab at [facility].     [1]  Social History Tobacco Use  Smoking Status Former   Current packs/day: 0.00   Average packs/day: 0.3 packs/day for 5.0 years (1.3 ttl pk-yrs)   Types: Cigarettes   Start date: 06/30/1963   Quit date: 06/29/1968   Years since quitting: 56.0  Smokeless Tobacco Never

## 2024-07-21 ENCOUNTER — Other Ambulatory Visit: Payer: Self-pay | Admitting: Pharmacy Technician

## 2024-07-21 ENCOUNTER — Other Ambulatory Visit: Payer: Self-pay

## 2024-07-24 ENCOUNTER — Other Ambulatory Visit: Payer: Self-pay

## 2024-07-24 ENCOUNTER — Ambulatory Visit: Attending: Primary Care

## 2024-07-24 ENCOUNTER — Ambulatory Visit (HOSPITAL_BASED_OUTPATIENT_CLINIC_OR_DEPARTMENT_OTHER): Admitting: Cardiovascular Disease

## 2024-07-24 DIAGNOSIS — J849 Interstitial pulmonary disease, unspecified: Secondary | ICD-10-CM

## 2024-07-24 DIAGNOSIS — J84112 Idiopathic pulmonary fibrosis: Secondary | ICD-10-CM

## 2024-07-24 MED ORDER — PIRFENIDONE 267 MG PO TABS
534.0000 mg | ORAL_TABLET | Freq: Three times a day (TID) | ORAL | 0 refills | Status: AC
  Filled 2024-07-24: qty 540, 90d supply, fill #0

## 2024-07-24 MED ORDER — NERANDOMILAST 18 MG PO TABS
18.0000 mg | ORAL_TABLET | Freq: Two times a day (BID) | ORAL | 5 refills | Status: AC
  Filled 2024-07-24: qty 60, fill #0

## 2024-07-24 NOTE — Addendum Note (Signed)
 Addended by: Niles Ess L on: 07/24/2024 12:26 PM   Modules accepted: Orders

## 2024-07-24 NOTE — Progress Notes (Signed)
 See pharmacotherapy visit note 07/24/24. Annual clinical assessment complete.   Patient taking pirfenidone  + Jascayd  for IPF. She recently restarted Jascayd  after temporary interruption d/t headaches. Now denies significant side effects.   No concerns identified at this time.  Aleck Puls, PharmD, BCPS, CPP Clinical Pharmacist  [facility]  [facility]

## 2024-07-24 NOTE — Progress Notes (Signed)
 [facility]  Referring Provider: Dr. Geronimo  Virtual Visit via Telephone Note  I connected with Ronal BIRCH. Belli on 07/24/24 at  1:00 PM EST by telephone and verified that I am speaking with the correct person using two identifiers.  Location: Patient: home Provider: office   I discussed the limitations, risks, security and privacy concerns of performing an evaluation and management service by telephone and the availability of in person appointments. I also discussed with the patient that there may be a patient responsible charge related to this service. The patient expressed understanding and agreed to proceed.   HPI: Amy Escobar is a 79 y.o. female who presents to the pharmacotherapy clinic via telephone for follow-up counseling on pirfenidone  and Jascayd . Specialty Pharmacy annual clinical assessment for pirfenidone  is due, and she recently had side effects of Jascayd  that led to temporary interruption of therapy. Contacting patient to discuss these medications.   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 Despite use of pirfenidone  at low dose protocol, evidence of progression was noted and she was referred to pharmacy team for new start Jascayd  in November 2025. Earlier in January 2026, patient contacted clinical team complaining of headache on Jascayd . See telephone note 07/07/24 - she was directed to stop Jascayd , then restart at 1 tablet once daily for one week, then increase to usual dose if tolerated.   Today, she reports she has restarted usual dose of Jascayd  and denies concerns. She denies headache.   Patient Active Problem List   Diagnosis Date Noted   Nocturnal hypoxemia 07/03/2024   Allergic conjunctivitis of both eyes 09/23/2023   Interstitial lung disease (HCC) 06/17/2023   Acute respiratory failure with hypoxia (HCC) 06/15/2023   Polyneuropathy associated with underlying disease 08/31/2022   Type 2 diabetes mellitus with diabetic  polyneuropathy, without long-term current use of insulin  (HCC) 06/18/2021   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 without macular edema, without long-term current use of insulin  (HCC) 06/18/2021   Diabetes mellitus (HCC) 06/18/2021   Osteoarthritis of right AC (acromioclavicular) joint 03/06/2021   Osteoarthritis of right glenohumeral joint 03/06/2021   Foot pain, bilateral 12/26/2020   Diabetic neuropathy (HCC) 01/10/2020   Hav (hallux abducto valgus), unspecified laterality 01/10/2020   Chronic arthropathy 01/10/2020   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 edema associated with type 2 diabetes mellitus (HCC) 12/26/2019   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   AC (acromioclavicular) arthritis 08/02/2019   Unspecified inflammatory spondylopathy, 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   Claudication 04/19/2019   Morbid obesity (HCC) 04/19/2019   Exposure to COVID-19 virus 04/13/2019   Upper airway cough syndrome 08/22/2018   Greater trochanteric bursitis of both hips 06/15/2018   Trigger point of shoulder region, left 02/07/2018   Hypertension    Mild pulmonary hypertension (HCC)    NSVT (nonsustained ventricular tachycardia) (HCC)    PVC's (premature ventricular contractions)    URI (upper respiratory infection) 08/09/2016   Neck pain 02/10/2016   Urinary incontinence 02/10/2016   Degenerative arthritis of knee, bilateral 07/17/2015   Knee MCL sprain 06/07/2015   Discomfort in chest 02/15/2015   Chest pain 02/15/2015   DM type 2, controlled, with complication (HCC)    Wellness examination 08/06/2014   Acute meniscal tear of knee 02/13/2014   Primary localized osteoarthrosis, lower leg 02/13/2014   Gastrocnemius tear 12/25/2013   UTI (urinary tract infection) 12/20/2013   Right knee pain 12/20/2013   Pulmonary hypertension (  HCC) 10/06/2013   DOE (dyspnea on exertion) 08/04/2013   Dysphagia  08/10/2012   Hip pain, left 02/02/2012   Painful respiration 05/25/2011   Encounter for long-term (current) use of other medications 01/30/2011   PELVIC PAIN, LEFT 07/30/2010   TOBACCO USE, QUIT 07/07/2010   FATIGUE 12/20/2009   Headache 12/20/2009   Low back pain 12/26/2008   Disorder of liver 04/13/2008   FOOT PAIN, BILATERAL 04/13/2008   FIBROIDS, UTERUS 11/24/2007   THYROID  NODULE, LEFT 11/24/2007   HYPERCHOLESTEROLEMIA 11/24/2007   CARPAL TUNNEL SYNDROME, BILATERAL 11/24/2007   ALKALINE PHOSPHATASE, ELEVATED 11/24/2007   Gout 08/10/2007   ANXIETY 08/10/2007   Essential hypertension 08/10/2007   Cough 08/01/2007   Seasonal and perennial allergic rhinitis 01/14/2007   GERD 01/14/2007   Osteoporosis 01/14/2007    Patient's Medications  New Prescriptions   No medications on file  Previous Medications   ACCU-CHEK SOFTCLIX LANCETS LANCETS    Use to prick finger to check blood glucose   ASPIRIN  EC 81 MG TABLET    Take 81 mg by mouth daily.   AZELASTINE  (ASTELIN ) 0.1 % NASAL SPRAY    Place 2 sprays into both nostrils 2 (two) times daily.   BENZONATATE  (TESSALON ) 200 MG CAPSULE    TAKE 1 CAPSULE BY MOUTH 3 TIMES A DAY AS NEEDED FOR COUGH   BLOOD GLUCOSE MONITORING SUPPL (ACCU-CHEK GUIDE) W/DEVICE KIT    Use to check blood glucose daily as directed   BLOOD GLUCOSE MONITORING SUPPL (ONETOUCH VERIO FLEX SYSTEM) W/DEVICE KIT    USE TO TEST BLOOD SUGAR DAILY AS DIRECTED   CALCIUM  CARB-CHOLECALCIFEROL (CALCIUM  + VITAMIN D3 PO)    Take 1 tablet by mouth daily.   CELECOXIB  (CELEBREX ) 200 MG CAPSULE    TAKE 1 CAPSULE BY MOUTH DAILY   CEPHALEXIN  (KEFLEX ) 250 MG CAPSULE    Take 250 mg by mouth daily.   CETIRIZINE  (ZYRTEC ) 10 MG TABLET    Take 0.5 tablets (5 mg total) by mouth daily.   ESOMEPRAZOLE  (NEXIUM ) 40 MG CAPSULE    Take 1 capsule (40 mg total) by mouth 2 (two) times daily before a meal.   ESTRADIOL (ESTRACE) 0.1 MG/GM VAGINAL CREAM    Place 1 Applicatorful vaginally at bedtime.    FESOTERODINE  (TOVIAZ ) 4 MG TB24 TABLET    Take 4 mg by mouth daily.   FLUTICASONE  (FLONASE ) 50 MCG/ACT NASAL SPRAY    Place 2 sprays into both nostrils daily as needed for allergies or rhinitis.   GABAPENTIN  (NEURONTIN ) 100 MG CAPSULE    Take 1 capsule (100 mg total) by mouth at bedtime.   GABAPENTIN  (NEURONTIN ) 300 MG CAPSULE    Take 1 capsule (300 mg total) by mouth daily as needed (neuropathy).   GLUCOSE BLOOD (ACCU-CHEK GUIDE TEST) TEST STRIP    Use to test blood glucose daily   GLUCOSE BLOOD (ONETOUCH VERIO) TEST STRIP    USE TO CHECK BLOOD SUGAR TWO TIMES A DAY   LIDOCAINE  (LIDODERM ) 5 %    APPLY 1 PATCH TO AFFECTED AREA FOR 12 HOURS IN A 24 HOUR PERIOD. DISCARD PATCH WITHIN 12 HOURS OR AS DIRECTED BY MD   MONTELUKAST  (SINGULAIR ) 10 MG TABLET    TAKE 1 TABLET BY MOUTH EVERY NIGHT AT BEDTIME   MULTIPLE VITAMINS-MINERALS (ZINC PO)    Take 1 tablet by mouth daily.   OXYGEN     Inhale into the lungs. 2 liters   PIRFENIDONE  267 MG TABS    Take 2 tablets (534 mg total)  by mouth in the morning, at noon, and at bedtime. **low dose as maintenance**   ROSUVASTATIN  (CRESTOR ) 20 MG TABLET    TAKE 1 TABLET BY MOUTH DAILY   SEMAGLUTIDE ,0.25 OR 0.5MG /DOS, (OZEMPIC , 0.25 OR 0.5 MG/DOSE,) 2 MG/3ML SOPN    0.5 mg by Other route once a week.   VALSARTAN  (DIOVAN ) 160 MG TABLET    TAKE 1 TABLET BY MOUTH DAILY  Modified Medications   No medications on file  Discontinued Medications   No medications on file    Allergies: Allergies[1]  Past Medical History: Past Medical History:  Diagnosis Date   Alkaline phosphatase deficiency    w/u Ne   Allergic rhinitis    Allergy     Anxiety    Arthritis    Cataract    BILATERAL-REMOVED   DM2 (diabetes mellitus, type 2) (HCC)    GERD (gastroesophageal reflux disease)    Gout    Hemorrhoids    Hyperlipidemia    Hypertension    Interstitial lung disease (HCC)    Mild pulmonary hypertension (HCC)    NSVT (nonsustained ventricular tachycardia) (HCC)     Osteoporosis    PVC's (premature ventricular contractions)    Thyroid  nodule    small   Tubular adenoma of colon 2017   Urticaria    Uterine fibroid     Social History: Social History   Socioeconomic History   Marital status: Widowed    Spouse name: Not on file   Number of children: 2   Years of education: Not on file   Highest education level: Not on file  Occupational History   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 Lives alone   Social Drivers of Health   Tobacco Use: Medium Risk (07/11/2024)   Patient History    Smoking Tobacco Use: Former    Smokeless Tobacco Use: Never    Passive Exposure: Not on file  Financial Resource Strain: Medium Risk (02/23/2024)   Overall Financial Resource Strain (CARDIA)    Difficulty of Paying Living Expenses: Somewhat hard  Food Insecurity: No Food Insecurity (02/23/2024)   Epic    Worried About Radiation Protection Practitioner of Food in the Last Year: Never true    Ran Out of Food in the Last Year: Never true  Transportation Needs: No Transportation Needs (02/23/2024)   Epic    Lack of Transportation (Medical): No    Lack of Transportation (Non-Medical): No  Physical Activity: Insufficiently Active (12/29/2023)   Exercise Vital Sign    Days of Exercise per Week: 2 days    Minutes of Exercise per Session: 20 min  Stress: No Stress Concern Present (12/29/2023)   [employer] of  Occupational Health - Occupational Stress Questionnaire    Feeling of Stress: Not at all  Social Connections: Moderately Integrated (12/29/2023)  Social Connection and Isolation Panel    Frequency of Communication with Friends and Family: More than three times a week    Frequency of Social Gatherings with Friends and Family: More than three times a week    Attends Religious Services: More than 4 times per year    Active Member of [employer] or Organizations: Yes    Attends Banker Meetings: More than 4 times per year    Marital Status: Widowed  Depression (PHQ2-9): Low Risk (07/10/2024)   Depression (PHQ2-9)    PHQ-2 Score: 2  Alcohol Screen: Low Risk (12/29/2023)   Alcohol Screen    Last Alcohol Screening Score (AUDIT): 0  Housing: Unknown (02/23/2024)   Epic    Unable to Pay for Housing in the Last Year: No    Number of Times Moved in the Last Year: Not on file    Homeless in the Last Year: No  Utilities: Not At Risk (02/23/2024)   Epic    Threatened with loss of utilities: No  Health Literacy: Adequate Health Literacy (12/29/2023)   B1300 Health Literacy    Frequency of need for help with medical instructions: Never      Latest Ref Rng & Units 05/22/2024   10:13 AM 04/14/2024    9:14 AM 03/07/2024    9:39 AM  Hepatic Function  Total Protein 6.0 - 8.3 g/dL 7.4  8.4  7.4   Albumin 3.5 - 5.2 g/dL 4.1  4.4  4.1   AST 0 - 37 U/L 22  25  29    ALT 0 - 35 U/L 25  22  27    Alk Phosphatase 39 - 117 U/L 87  121  95   Total Bilirubin 0.2 - 1.2 mg/dL 0.4  0.3  0.3   Bilirubin, Direct 0.0 - 0.3 mg/dL  0.1     CMP     Component Value Date/Time   NA 137 05/22/2024 1013   NA 143 01/21/2023 0950   K 4.4 05/22/2024 1013   CL 101 05/22/2024 1013   CO2 30 05/22/2024 1013   GLUCOSE 92 05/22/2024 1013   BUN 15 05/22/2024 1013   BUN 19 01/21/2023 0950   CREATININE 0.82 05/22/2024 1013   CREATININE 0.76 02/13/2020 1008   CALCIUM  9.4 05/22/2024 1013   PROT 7.4 05/22/2024 1013   PROT  7.6 01/21/2023 0950   ALBUMIN 4.1 05/22/2024 1013   ALBUMIN 4.4 01/21/2023 0950   AST 22 05/22/2024 1013   ALT 25 05/22/2024 1013   ALKPHOS 87 05/22/2024 1013   BILITOT 0.4 05/22/2024 1013   BILITOT 0.3 01/21/2023 0950   GFR 68.32 05/22/2024 1013   EGFR 75 01/21/2023 0950   GFRNONAA >60 06/18/2023 0244     Medications: 1) Jascayd  18mg  tablet - Take 1 tablet by mouth twice daily   2) pirfenidone  (Esbriet ) 267mg  tablet - Take 2 tablets by mouth three times daily   Reviewed purpose and proper use of medications above, including side effects. She denies concerns with medications at this time and reports adherence. She agrees to contact clinic if new questions or concerns come up.   Plan: - CONTINUE Jascayd  18mg  twice daily  - CONTINUE Esbriet  534mg  three times daily  - She denies side effects at this time but agrees to reach out if she encounters issues  - Follow-up with Dr. Geronimo on 08/28/24.  I discussed the assessment and treatment plan with the patient. The patient was provided an opportunity to ask questions and all were  answered. The patient agreed with the plan and demonstrated an understanding of the instructions.   The patient was advised to call back or seek an in-person evaluation if the symptoms worsen or if the condition fails to improve as anticipated.  I provided 10 minutes of non-face-to-face time during this encounter.  Aleck Puls, PharmD, BCPS, CPP Clinical Pharmacist  [facility]  [facility], [facility], [facility]      [1]  Allergies Allergen Reactions   Benicar [Olmesartan] Other (See Comments)    Headache

## 2024-07-25 ENCOUNTER — Encounter (HOSPITAL_COMMUNITY)

## 2024-07-25 ENCOUNTER — Telehealth (HOSPITAL_COMMUNITY): Payer: Self-pay | Admitting: *Deleted

## 2024-07-25 NOTE — Telephone Encounter (Signed)
 Ronal Single left message on department voicemail yesterday. She will be absent from pulmonary rehab today, she can't get out of her driveway due to ice. She can be reached at [phone] for follow-up. Will forward message to her pulmonary rehab team.

## 2024-07-27 ENCOUNTER — Telehealth (HOSPITAL_COMMUNITY): Payer: Self-pay | Admitting: *Deleted

## 2024-07-27 ENCOUNTER — Encounter (HOSPITAL_COMMUNITY): Admission: RE | Admit: 2024-07-27 | Source: Ambulatory Visit

## 2024-07-27 NOTE — Telephone Encounter (Signed)
 Called pt to inform of elevator being out of service. She sts she will need to cancel Pulmonary Rehab today given her parking lot is still ice.  Aliene Aris BS, ACSM-CEP 07/27/2024 8:56 AM

## 2024-07-28 ENCOUNTER — Telehealth: Payer: Self-pay | Admitting: *Deleted

## 2024-07-28 NOTE — Telephone Encounter (Signed)
 Copied from CRM #[id number]. Topic: Clinical - Medication Question >> Jul 25, 2024  8:26 AM LaVerne A wrote: Reason for CRM: Patient spoke with Aleck in the pharmacy yesterday.  She wants her to know that the medication, nerandomilast  (JASCAYD ) 18 MG tablet, gave her a bad headache.  She stopped taking it.  Please return her call at [phone].

## 2024-07-30 ENCOUNTER — Other Ambulatory Visit: Payer: Self-pay | Admitting: Family

## 2024-07-30 DIAGNOSIS — E785 Hyperlipidemia, unspecified: Secondary | ICD-10-CM

## 2024-07-30 DIAGNOSIS — I25118 Atherosclerotic heart disease of native coronary artery with other forms of angina pectoris: Secondary | ICD-10-CM

## 2024-07-31 ENCOUNTER — Encounter

## 2024-08-01 ENCOUNTER — Encounter (HOSPITAL_COMMUNITY): Admission: RE | Admit: 2024-08-01 | Source: Ambulatory Visit

## 2024-08-01 VITALS — Wt 158.1 lb

## 2024-08-03 ENCOUNTER — Encounter (HOSPITAL_COMMUNITY): Admission: RE | Admit: 2024-08-03 | Source: Ambulatory Visit

## 2024-08-03 ENCOUNTER — Telehealth (HOSPITAL_COMMUNITY): Payer: Self-pay

## 2024-08-03 NOTE — Telephone Encounter (Signed)
 Patient c/o for 10:15 PR class, says she has a slight cold. She will be in next Tuesday but may miss Thursday due to having a biopsy done on her bladder on Wednesday.

## 2024-08-08 ENCOUNTER — Encounter (HOSPITAL_COMMUNITY)

## 2024-08-10 ENCOUNTER — Encounter (HOSPITAL_COMMUNITY)

## 2024-08-15 ENCOUNTER — Encounter (HOSPITAL_COMMUNITY)

## 2024-08-17 ENCOUNTER — Encounter (HOSPITAL_COMMUNITY)

## 2024-08-22 ENCOUNTER — Encounter (HOSPITAL_COMMUNITY)

## 2024-08-24 ENCOUNTER — Encounter (HOSPITAL_COMMUNITY)

## 2024-08-25 ENCOUNTER — Encounter

## 2024-08-28 ENCOUNTER — Ambulatory Visit: Admitting: Internal Medicine

## 2024-08-29 ENCOUNTER — Encounter (HOSPITAL_COMMUNITY)

## 2024-08-31 ENCOUNTER — Encounter (HOSPITAL_COMMUNITY)

## 2024-09-05 ENCOUNTER — Ambulatory Visit: Admitting: Internal Medicine

## 2024-09-07 ENCOUNTER — Encounter (HOSPITAL_COMMUNITY)

## 2024-09-12 ENCOUNTER — Encounter (HOSPITAL_COMMUNITY)

## 2024-09-14 ENCOUNTER — Encounter (HOSPITAL_COMMUNITY)

## 2024-09-19 ENCOUNTER — Encounter (HOSPITAL_COMMUNITY)

## 2024-09-21 ENCOUNTER — Encounter (HOSPITAL_COMMUNITY)

## 2024-09-26 ENCOUNTER — Encounter (HOSPITAL_COMMUNITY)

## 2024-09-28 ENCOUNTER — Encounter (HOSPITAL_COMMUNITY)

## 2024-10-03 ENCOUNTER — Encounter (HOSPITAL_COMMUNITY)

## 2024-10-06 ENCOUNTER — Ambulatory Visit: Admitting: Allergy

## 2024-10-31 ENCOUNTER — Ambulatory Visit (HOSPITAL_BASED_OUTPATIENT_CLINIC_OR_DEPARTMENT_OTHER): Admitting: Family

## 2025-01-03 ENCOUNTER — Ambulatory Visit

## 2025-01-08 ENCOUNTER — Ambulatory Visit: Admitting: Family Medicine

## 2025-04-19 ENCOUNTER — Encounter: Admitting: Obstetrics and Gynecology
